# Patient Record
Sex: Female | Born: 1965 | Race: White | Hispanic: No | Marital: Married | Smoking: Never smoker
Health system: Southern US, Community
[De-identification: ages and names within clinical notes are randomized; demographics above are authoritative.]

## PROBLEM LIST (undated history)

## (undated) DIAGNOSIS — T4145XA Adverse effect of unspecified anesthetic, initial encounter: Secondary | ICD-10-CM

## (undated) DIAGNOSIS — Z5189 Encounter for other specified aftercare: Secondary | ICD-10-CM

## (undated) DIAGNOSIS — Z9889 Other specified postprocedural states: Secondary | ICD-10-CM

## (undated) DIAGNOSIS — J45909 Unspecified asthma, uncomplicated: Secondary | ICD-10-CM

## (undated) DIAGNOSIS — D649 Anemia, unspecified: Secondary | ICD-10-CM

## (undated) DIAGNOSIS — T8859XA Other complications of anesthesia, initial encounter: Secondary | ICD-10-CM

## (undated) DIAGNOSIS — C4490 Unspecified malignant neoplasm of skin, unspecified: Secondary | ICD-10-CM

## (undated) DIAGNOSIS — D702 Other drug-induced agranulocytosis: Secondary | ICD-10-CM

## (undated) DIAGNOSIS — G608 Other hereditary and idiopathic neuropathies: Secondary | ICD-10-CM

## (undated) DIAGNOSIS — Z87442 Personal history of urinary calculi: Secondary | ICD-10-CM

## (undated) DIAGNOSIS — R112 Nausea with vomiting, unspecified: Secondary | ICD-10-CM

## (undated) DIAGNOSIS — T7840XA Allergy, unspecified, initial encounter: Secondary | ICD-10-CM

## (undated) DIAGNOSIS — R011 Cardiac murmur, unspecified: Secondary | ICD-10-CM

## (undated) HISTORY — DX: Encounter for other specified aftercare: Z51.89

## (undated) HISTORY — DX: Other hereditary and idiopathic neuropathies: G60.8

## (undated) HISTORY — DX: Allergy, unspecified, initial encounter: T78.40XA

## (undated) HISTORY — PX: DILATION AND CURETTAGE OF UTERUS: SHX78

## (undated) HISTORY — DX: Unspecified malignant neoplasm of skin, unspecified: C44.90

## (undated) HISTORY — PX: CHOLECYSTECTOMY: SHX55

## (undated) HISTORY — PX: ABDOMINAL HYSTERECTOMY: SHX81

## (undated) HISTORY — PX: OTHER SURGICAL HISTORY: SHX169

## (undated) HISTORY — DX: Other drug-induced agranulocytosis: D70.2

---

## 1998-12-11 ENCOUNTER — Other Ambulatory Visit: Admission: RE | Admit: 1998-12-11 | Discharge: 1998-12-11 | Payer: Self-pay | Admitting: *Deleted

## 2000-01-18 ENCOUNTER — Other Ambulatory Visit: Admission: RE | Admit: 2000-01-18 | Discharge: 2000-01-18 | Payer: Self-pay | Admitting: *Deleted

## 2001-02-01 ENCOUNTER — Other Ambulatory Visit: Admission: RE | Admit: 2001-02-01 | Discharge: 2001-02-01 | Payer: Self-pay | Admitting: Obstetrics and Gynecology

## 2001-10-27 ENCOUNTER — Ambulatory Visit (HOSPITAL_COMMUNITY): Admission: RE | Admit: 2001-10-27 | Discharge: 2001-10-27 | Payer: Self-pay | Admitting: *Deleted

## 2002-03-01 ENCOUNTER — Other Ambulatory Visit: Admission: RE | Admit: 2002-03-01 | Discharge: 2002-03-01 | Payer: Self-pay | Admitting: Obstetrics and Gynecology

## 2003-03-20 ENCOUNTER — Other Ambulatory Visit: Admission: RE | Admit: 2003-03-20 | Discharge: 2003-03-20 | Payer: Self-pay | Admitting: Obstetrics and Gynecology

## 2003-11-18 ENCOUNTER — Ambulatory Visit (HOSPITAL_COMMUNITY): Admission: RE | Admit: 2003-11-18 | Discharge: 2003-11-18 | Payer: Self-pay | Admitting: Obstetrics and Gynecology

## 2004-12-28 ENCOUNTER — Ambulatory Visit (HOSPITAL_COMMUNITY): Admission: RE | Admit: 2004-12-28 | Discharge: 2004-12-28 | Payer: Self-pay | Admitting: Obstetrics and Gynecology

## 2005-12-30 ENCOUNTER — Ambulatory Visit (HOSPITAL_COMMUNITY): Admission: RE | Admit: 2005-12-30 | Discharge: 2005-12-30 | Payer: Self-pay | Admitting: Obstetrics and Gynecology

## 2006-09-22 ENCOUNTER — Encounter: Admission: RE | Admit: 2006-09-22 | Discharge: 2006-09-22 | Payer: Self-pay | Admitting: Allergy and Immunology

## 2007-01-25 ENCOUNTER — Ambulatory Visit (HOSPITAL_COMMUNITY): Admission: RE | Admit: 2007-01-25 | Discharge: 2007-01-25 | Payer: Self-pay | Admitting: Obstetrics and Gynecology

## 2007-06-26 ENCOUNTER — Encounter: Admission: RE | Admit: 2007-06-26 | Discharge: 2007-06-26 | Payer: Self-pay | Admitting: Gastroenterology

## 2008-01-22 ENCOUNTER — Encounter: Admission: RE | Admit: 2008-01-22 | Discharge: 2008-01-22 | Payer: Self-pay | Admitting: Gastroenterology

## 2008-01-29 ENCOUNTER — Ambulatory Visit (HOSPITAL_COMMUNITY): Admission: RE | Admit: 2008-01-29 | Discharge: 2008-01-29 | Payer: Self-pay | Admitting: Obstetrics and Gynecology

## 2008-02-13 ENCOUNTER — Encounter: Admission: RE | Admit: 2008-02-13 | Discharge: 2008-02-13 | Payer: Self-pay | Admitting: Gastroenterology

## 2008-04-29 ENCOUNTER — Ambulatory Visit (HOSPITAL_COMMUNITY): Admission: RE | Admit: 2008-04-29 | Discharge: 2008-04-30 | Payer: Self-pay | Admitting: Obstetrics and Gynecology

## 2008-04-29 ENCOUNTER — Encounter (INDEPENDENT_AMBULATORY_CARE_PROVIDER_SITE_OTHER): Payer: Self-pay | Admitting: Obstetrics and Gynecology

## 2009-02-04 ENCOUNTER — Ambulatory Visit (HOSPITAL_COMMUNITY): Admission: RE | Admit: 2009-02-04 | Discharge: 2009-02-04 | Payer: Self-pay | Admitting: Obstetrics and Gynecology

## 2010-02-11 ENCOUNTER — Ambulatory Visit (HOSPITAL_COMMUNITY)
Admission: RE | Admit: 2010-02-11 | Discharge: 2010-02-11 | Payer: Self-pay | Source: Home / Self Care | Attending: Obstetrics and Gynecology | Admitting: Obstetrics and Gynecology

## 2010-02-13 ENCOUNTER — Emergency Department (HOSPITAL_COMMUNITY)
Admission: EM | Admit: 2010-02-13 | Discharge: 2010-02-13 | Payer: Self-pay | Source: Home / Self Care | Admitting: Emergency Medicine

## 2010-04-27 LAB — CBC
HCT: 39.1 % (ref 36.0–46.0)
Hemoglobin: 13.3 g/dL (ref 12.0–15.0)
MCH: 31.5 pg (ref 26.0–34.0)
MCHC: 34 g/dL (ref 30.0–36.0)
MCV: 92.7 fL (ref 78.0–100.0)
Platelets: 301 10*3/uL (ref 150–400)
RBC: 4.22 MIL/uL (ref 3.87–5.11)
RDW: 12.3 % (ref 11.5–15.5)
WBC: 10.8 10*3/uL — ABNORMAL HIGH (ref 4.0–10.5)

## 2010-04-27 LAB — BASIC METABOLIC PANEL
BUN: 5 mg/dL — ABNORMAL LOW (ref 6–23)
CO2: 26 mEq/L (ref 19–32)
Calcium: 9.4 mg/dL (ref 8.4–10.5)
Chloride: 100 mEq/L (ref 96–112)
Creatinine, Ser: 0.65 mg/dL (ref 0.4–1.2)
GFR calc Af Amer: >60 mL/min (ref >60)
GFR calc non Af Amer: >60 mL/min (ref >60)
Glucose, Bld: 109 mg/dL — ABNORMAL HIGH (ref 70–99)
Potassium: 3.9 mEq/L (ref 3.5–5.1)
Sodium: 136 mEq/L (ref 135–145)

## 2010-04-27 LAB — URINALYSIS, ROUTINE W REFLEX MICROSCOPIC
Bilirubin Urine: NEGATIVE
Glucose, UA: NEGATIVE mg/dL
Hgb urine dipstick: NEGATIVE
Nitrite: NEGATIVE
Protein, ur: NEGATIVE mg/dL
Specific Gravity, Urine: 1.019 (ref 1.005–1.030)
Urobilinogen, UA: 0.2 mg/dL (ref 0.0–1.0)
pH: 5.5 (ref 5.0–8.0)

## 2010-04-27 LAB — DIFFERENTIAL
Basophils Absolute: 0 10*3/uL (ref 0.0–0.1)
Basophils Relative: 0 % (ref 0–1)
Eosinophils Absolute: 0 10*3/uL (ref 0.0–0.7)
Eosinophils Relative: 0 % (ref 0–5)
Lymphocytes Relative: 11 % — ABNORMAL LOW (ref 12–46)
Lymphs Abs: 1.2 10*3/uL (ref 0.7–4.0)
Monocytes Absolute: 0.5 10*3/uL (ref 0.1–1.0)
Monocytes Relative: 5 % (ref 3–12)
Neutro Abs: 9.1 10*3/uL — ABNORMAL HIGH (ref 1.7–7.7)
Neutrophils Relative %: 84 % — ABNORMAL HIGH (ref 43–77)

## 2010-04-27 LAB — URINE MICROSCOPIC-ADD ON

## 2010-05-28 LAB — PREGNANCY, URINE: Preg Test, Ur: NEGATIVE

## 2010-05-28 LAB — CBC
HCT: 29.7 % — ABNORMAL LOW (ref 36.0–46.0)
HCT: 34.9 % — ABNORMAL LOW (ref 36.0–46.0)
Hemoglobin: 10.1 g/dL — ABNORMAL LOW (ref 12.0–15.0)
Hemoglobin: 11.8 g/dL — ABNORMAL LOW (ref 12.0–15.0)
MCHC: 34.1 g/dL (ref 30.0–36.0)
MCHC: 33.8 g/dL (ref 30.0–36.0)
MCV: 90.2 fL (ref 78.0–100.0)
MCV: 90.3 fL (ref 78.0–100.0)
Platelets: 182 10*3/uL (ref 150–400)
Platelets: 218 10*3/uL (ref 150–400)
RBC: 3.3 MIL/uL — ABNORMAL LOW (ref 3.87–5.11)
RBC: 3.87 MIL/uL (ref 3.87–5.11)
RDW: 13.2 % (ref 11.5–15.5)
RDW: 13.2 % (ref 11.5–15.5)
WBC: 6.7 10*3/uL (ref 4.0–10.5)
WBC: 3.6 10*3/uL — ABNORMAL LOW (ref 4.0–10.5)

## 2010-05-28 LAB — BASIC METABOLIC PANEL
BUN: 3 mg/dL — ABNORMAL LOW (ref 6–23)
CO2: 27 mEq/L (ref 19–32)
Calcium: 8.2 mg/dL — ABNORMAL LOW (ref 8.4–10.5)
Chloride: 106 mEq/L (ref 96–112)
Creatinine, Ser: 0.6 mg/dL (ref 0.4–1.2)
GFR calc Af Amer: >60 mL/min (ref >60)
GFR calc non Af Amer: >60 mL/min (ref >60)
Glucose, Bld: 129 mg/dL — ABNORMAL HIGH (ref 70–99)
Potassium: 3.6 mEq/L (ref 3.5–5.1)
Sodium: 137 mEq/L (ref 135–145)

## 2010-06-30 NOTE — Discharge Summary (Signed)
NAMEVONCILLE, SIMM                  ACCOUNT NO.:  [id number]   MEDICAL RECORD NO.:  [id number]          PATIENT TYPE:  OIB   LOCATION:  9315                          FACILITY:  [facility]   PHYSICIAN:  Gabrielle Dare. Janee Morn, M.D.DATE OF BIRTH:  01-Jun-1965   DATE OF ADMISSION:  04/29/2008  DATE OF DISCHARGE:                               DISCHARGE SUMMARY   PREOPERATIVE DIAGNOSES:  1. Gallbladder polyp.  2. Abdominal pain.   POSTOPERATIVE DIAGNOSES:  1. Gallbladder polyp.  2. Abdominal pain.   PROCEDURE:  Laparoscopic cholecystectomy with intraoperative  cholangiogram.   SURGEON:  Gabrielle Dare. Janee Morn, M.D.   ASSISTANT:  Angelia Mould. Derrell Lolling, M.D.   ANESTHESIA:  General endotracheal.   HISTORY OF PRESENT ILLNESS:  Kathryn Ramsey is a 45 year old female who I  evaluated in the office for abdominal pain.  Her workup had included  ultrasound showing a gallbladder polyp.  She is undergoing laparoscopic-  assisted hysterectomy today by Dr. Maxie Better and I am following  while the patient is in the operating room with a cholecystectomy.   PROCEDURE IN DETAIL:  The patient was identified in the operating room.  We did our timeout procedure.  Her abdomen was already prepped and  draped.  She also had 2 ports left in place by Dr. Cherly Hensen, 1 a 10-mm in  the umbilicus and there was also a right lower quadrant 5-mm port so  laparoscopic exploration revealed gallbladder with some filmy adhesions.  Under direct vision, we placed an 11-mm epigastric port and then one 5-  mm right-sided port.  Marcaine 0.25% with epinephrine was used at those  port sites, then when the gallbladder was retracted superomedially, the  filmy adhesions were taken down with sharp dissection as they were  adhesions between the duodenum and the gallbladder.  These were all  taken down safely and we stayed well away from the duodenum.  Once this  swept down, we identified the infundibulum.  This was retracted  inferolaterally.   Dissection began laterally and progressed medially.  This identified the anterior branch of the cystic artery and this was  clipped twice proximally, once distally and divided.  Further dissection  revealed the cystic duct.  This was dissected until a large window was  created between the cystic duct, the infundibulum and the liver.  Once  we had excellent visualization, a clip was placed on the  infundibulocystic duct junction.  A small nick was made in the cystic  duct and a Reddick cholangiogram catheter was inserted.  Intraoperative  cholangiogram was obtained demonstrating no common bile duct  filling  defects and good flow of contrast into the duodenum.  Cholangiogram  catheter was removed.  Three clips were placed proximally on the cystic  duct and it was divided.  Further dissection revealed a posterior branch  of the cystic artery.  This was clipped twice proximally, once distally  and it was divided.  The gallbladder was taken off the liver bed with  the Bovie cautery.  We did encounter 1 other vein versus another small  branch of the cystic  artery.  This was also clipped twice proximally and  divided distally with the cautery.  The gallbladder was taken the rest  of the way off the liver bed with the Bovie cautery, placed in an  EndoCatch bag.  It was removed from the abdomen via the infraumbilical  port site.  The abdomen was copiously irrigated with saline, then the  irrigation fluid returned clear.  The liver bed was checked and was  completely dry.  The clips were all in good position.  The remainder of  the irrigation fluid was evacuated and it did remain clear.  The liver  bed was checked 1 more time and remained dry.  The ports were then  removed under direct vision.  The pneumoperitoneum was released.  The  infraumbilical fascia was closed by tying the 0-Vicryl pursestring  suture.  All 4 wounds were copiously irrigated and the skin of each was  closed with a running 4-0  Vicryl subcuticular stitch followed by  Dermabond.  Sponge, needle and instrument counts were correct.  Patient  tolerated the procedure well without apparent complication, was checked  in recovery room in stable condition.      Gabrielle Dare Janee Morn, M.D.  Electronically Signed     B[facility]/MEDQ  D:  04/29/2008  T:  04/29/2008  Job:  [id number]   cc:   Maxie Better, M.D.  Fax: [phone]   JXBJYN WGN FAOZ, M.D.  Fax: [phone]   [place]. Cliffton Asters, M.D.  Fax: [phone]

## 2010-06-30 NOTE — Op Note (Signed)
NAME:  Kathryn Ramsey, Kathryn Ramsey                  ACCOUNT NO.:  [id number]   MEDICAL RECORD NO.:  [id number]          PATIENT TYPE:  OIB   LOCATION:  9315                          FACILITY:  [facility]   PHYSICIAN:  Maxie Better, M.D.DATE OF BIRTH:  [birth year]   DATE OF PROCEDURE:  DATE OF DISCHARGE:                               OPERATIVE REPORT   PREOPERATIVE DIAGNOSES:  1. Menorrhagia.  2. Fibroid uterus.  3. Pelvic pain.   PROCEDURE:  1. Laparoscopic-assisted vaginal hysterectomy.  2. Right paratubal cyst removal.   POSTOPERATIVE DIAGNOSES:  1. Fibroid uterus.  2. Menorrhagia.  3. Ruptured left ovarian cyst.  4. Pelvic pain.   ANESTHESIA:  General.   SURGEON:  Maxie Better, MD   ASSISTANT:  Alphonsus Sias. Ernestina Penna, MD   PROCEDURE:  Under adequate general anesthesia, the patient was placed in  the dorsal lithotomy position.  She was sterilely prepped and draped in  usual fashion for a planned laparoscopic vaginal hysterectomy and a  laparoscopic cholecystectomy.  Examination under anesthesia revealed a  bulky 8-week size retroverted uterus.  No adnexal masses could be  appreciated.  Bivalve speculum was placed in the vagina.  Single-tooth  tenaculum was placed on the anterior lip of the cervix.  An acorn  cannula was introduced in the cervical os and attached to the tenaculum  for manipulation of the uterus.  The bivalve speculum was then removed.  Indwelling Foley catheter was placed.  The patient was then turned to  the abdomen.  Previous laparoscopic incision was noted.  Marcaine 0.25%  was injected.  Incision was then made infraumbilical carried down to the  rectus fascia.  Rectus fascia was opened transversely, and a pursestring  suture was then placed.  The peritoneum was entered bluntly, and Hasson  cannula introduced into the abdominal cavity.  A lighted video  laparoscope was placed through that port.  Two sets of other ports were  placed in right and left lower quadrant  under direct visualization after  0.25% Marcaine was injected and small incision was made on either side.  Once the 5mm ports were placed, the pelvis was inspected.  Normal liver  edge was noted and the gallbladder was seen.  The uterus with multiple  fibroids, large one prominent posteriorly.  Hemoperitoneum was noted.  Right tube with a paratubal cyst and  right ovary were normal.  Left  ovary had ruptured surface that was slightly bleeding and normal left  tube.  The pelvis was irrigated, suctioned of the blood. The ureters  were noted to be peristalsing bilaterally. The  procedure was then  performed using Gyrus, and the round ligaments bilaterally were clamped,  cauterized, and cut.  The utero-ovarian ligaments were bilaterally  clamped, cauterized, and cut.  The uterine vessels were seen bilaterally  and they were both cauterized but not cut.  The anterior leaf of the  bladder reflection was then opened transversely, and the bladder was  then sharply dissected off the lower uterine segment.  Once this was  done, attention was then turned to the vagina.  The cervix was then  grasped with [place] clamps.  Dilute solution of Pitressin was injected  circumferentially at the cervicovaginal junction.  A circumferential  incision was then made, and the anterior and posterior cul-de-sac was  then opened without incident.  The uterosacrals were bilaterally  clamped, cut, and suture ligated with 0 Vicryl.  The posterior vaginal  cuff was reefed with 0 Vicryl suture.  The cardinal ligaments were then  serially clamped, cauterized, and cut until all attachments of the  uterus was then removed.  The uterine vessels was then cauterized as  well.  The uterus was then removed after much traction due to the  multiple fibroids.  Once that was removed, the postop cul-de-sac was  inspected.  The rectosigmoid was very close to the vaginal cuff but not  including it, and therefore a McCall culdoplasty  was not done.  Several  other additional stitches was placed on the vaginal cuff posteriorly for  hemostasis.  Once this was all performed, the vagina was then closed in  vertical fashion using interrupted 0 Vicryl sutures.  The uterosacrals  were tied in the midline bilaterally prior to the final closure.  The  vagina was irrigated.  It was then noted that there was a small abrasion  of the left vagina wall from the manipulation during the surgery and 3-0  Vicryl sutures were then used to close that.  Using sterile technique,  attention was then turned back to the abdomen which was reinsufflated.  The scope was reinserted.  The pelvis was irrigated, suctioned.  The  abdomen was partially deflated.  The adnexa was inspected, and bleeding  was noted on the left adnexa that was cauterized.  The rest the pelvis  looked hemostased.  At that point, the left lower port was removed,  sutured with 4-0 Vicryl suture.  The right port and the infraumbilical  port was left for Dr. Janee Morn to perform his laparoscopic  cholecystectomy.  Please see his dictation for that portion of the case.   SPECIMENS:  Uterus and cervix and paratubal cyst all sent to Pathology.   ESTIMATED BLOOD LOSS:  200.   INTRAOPERATIVE FLUID:  2600.   URINE OUTPUT:  450 mL of clear yellow urine.   Sponge and instrument counts correct.   COMPLICATIONS:  None.   The patient tolerated the procedure well and will be transferred after  the laparoscopic cholecystectomy.      Maxie Better, M.D.  Electronically Signed     [place]/MEDQ  D:  04/29/2008  T:  04/30/2008  Job:  [id number]

## 2011-01-05 ENCOUNTER — Other Ambulatory Visit (HOSPITAL_COMMUNITY): Payer: Self-pay | Admitting: Obstetrics and Gynecology

## 2011-01-05 DIAGNOSIS — Z1231 Encounter for screening mammogram for malignant neoplasm of breast: Secondary | ICD-10-CM

## 2011-02-19 ENCOUNTER — Ambulatory Visit (HOSPITAL_COMMUNITY)
Admission: RE | Admit: 2011-02-19 | Discharge: 2011-02-19 | Disposition: A | Discharge status: Home / Self Care | Payer: 59 | Source: Ambulatory Visit | Attending: Obstetrics and Gynecology | Admitting: Obstetrics and Gynecology

## 2011-02-19 DIAGNOSIS — Z1231 Encounter for screening mammogram for malignant neoplasm of breast: Secondary | ICD-10-CM | POA: Insufficient documentation

## 2012-01-26 ENCOUNTER — Other Ambulatory Visit (HOSPITAL_COMMUNITY): Payer: Self-pay | Admitting: Obstetrics and Gynecology

## 2012-01-26 DIAGNOSIS — Z1231 Encounter for screening mammogram for malignant neoplasm of breast: Secondary | ICD-10-CM

## 2012-02-29 ENCOUNTER — Ambulatory Visit (HOSPITAL_COMMUNITY)
Admission: RE | Admit: 2012-02-29 | Discharge: 2012-02-29 | Disposition: A | Discharge status: Home / Self Care | Payer: BC Managed Care – PPO | Source: Ambulatory Visit | Attending: Obstetrics and Gynecology | Admitting: Obstetrics and Gynecology

## 2012-02-29 DIAGNOSIS — Z1231 Encounter for screening mammogram for malignant neoplasm of breast: Secondary | ICD-10-CM | POA: Insufficient documentation

## 2013-01-30 ENCOUNTER — Other Ambulatory Visit (HOSPITAL_COMMUNITY): Payer: Self-pay | Admitting: Obstetrics and Gynecology

## 2013-01-30 DIAGNOSIS — Z1231 Encounter for screening mammogram for malignant neoplasm of breast: Secondary | ICD-10-CM

## 2013-03-05 ENCOUNTER — Ambulatory Visit (HOSPITAL_COMMUNITY)
Admission: RE | Admit: 2013-03-05 | Discharge: 2013-03-05 | Disposition: A | Discharge status: Home / Self Care | Payer: BC Managed Care – PPO | Source: Ambulatory Visit | Attending: Obstetrics and Gynecology | Admitting: Obstetrics and Gynecology

## 2013-03-05 DIAGNOSIS — Z1231 Encounter for screening mammogram for malignant neoplasm of breast: Secondary | ICD-10-CM | POA: Insufficient documentation

## 2014-01-28 ENCOUNTER — Other Ambulatory Visit (HOSPITAL_COMMUNITY): Payer: Self-pay | Admitting: Obstetrics and Gynecology

## 2014-01-28 DIAGNOSIS — Z1231 Encounter for screening mammogram for malignant neoplasm of breast: Secondary | ICD-10-CM

## 2014-03-13 ENCOUNTER — Ambulatory Visit (HOSPITAL_COMMUNITY): Payer: BLUE CROSS/BLUE SHIELD

## 2014-03-20 ENCOUNTER — Ambulatory Visit (HOSPITAL_COMMUNITY)
Admission: RE | Admit: 2014-03-20 | Discharge: 2014-03-20 | Disposition: A | Discharge status: Home / Self Care | Payer: BLUE CROSS/BLUE SHIELD | Source: Ambulatory Visit | Attending: Obstetrics and Gynecology | Admitting: Obstetrics and Gynecology

## 2014-03-20 DIAGNOSIS — Z1231 Encounter for screening mammogram for malignant neoplasm of breast: Secondary | ICD-10-CM | POA: Insufficient documentation

## 2015-02-11 ENCOUNTER — Other Ambulatory Visit: Payer: Self-pay

## 2015-02-11 DIAGNOSIS — Z1231 Encounter for screening mammogram for malignant neoplasm of breast: Secondary | ICD-10-CM

## 2015-03-06 ENCOUNTER — Encounter: Payer: Self-pay | Admitting: *Deleted

## 2015-03-12 ENCOUNTER — Ambulatory Visit: Payer: BLUE CROSS/BLUE SHIELD | Admitting: Oncology

## 2015-03-17 ENCOUNTER — Ambulatory Visit: Payer: BLUE CROSS/BLUE SHIELD | Attending: Gynecologic Oncology | Admitting: Gynecologic Oncology

## 2015-03-17 ENCOUNTER — Encounter: Payer: Self-pay | Admitting: Gynecologic Oncology

## 2015-03-17 VITALS — BP 115/67 | HR 68 | Temp 97.5°F | Resp 18 | Ht 63.0 in | Wt 118.2 lb

## 2015-03-17 DIAGNOSIS — R971 Elevated cancer antigen 125 [CA 125]: Secondary | ICD-10-CM | POA: Diagnosis not present

## 2015-03-17 DIAGNOSIS — C482 Malignant neoplasm of peritoneum, unspecified: Secondary | ICD-10-CM | POA: Diagnosis not present

## 2015-03-17 DIAGNOSIS — C800 Disseminated malignant neoplasm, unspecified: Secondary | ICD-10-CM | POA: Diagnosis present

## 2015-03-17 DIAGNOSIS — C786 Secondary malignant neoplasm of retroperitoneum and peritoneum: Secondary | ICD-10-CM | POA: Diagnosis not present

## 2015-03-17 DIAGNOSIS — N951 Menopausal and female climacteric states: Secondary | ICD-10-CM | POA: Diagnosis not present

## 2015-03-17 MED ORDER — ESTRADIOL 0.05 MG/24HR TD PTTW: 1.0000 | MEDICATED_PATCH | TRANSDERMAL | Status: DC

## 2015-03-17 NOTE — Patient Instructions (Addendum)
You will receive a phone call from the Radiology Department with an appointment date and time for a biopsy.  Plan to have a CT scan of the chest as well on February 3 at [facility].  You will also receive a phone call from the [facility] with an appointment date and time to meet with Dr. Evlyn Clines, Medical Oncologist.
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
## 2015-03-18 ENCOUNTER — Other Ambulatory Visit: Payer: Self-pay | Admitting: Gynecologic Oncology

## 2015-03-18 ENCOUNTER — Inpatient Hospital Stay
Admission: RE | Admit: 2015-03-18 | Discharge: 2015-03-18 | Disposition: A | Discharge status: Home / Self Care | Payer: Self-pay | Source: Ambulatory Visit | Attending: Gynecologic Oncology | Admitting: Gynecologic Oncology

## 2015-03-18 DIAGNOSIS — C801 Malignant (primary) neoplasm, unspecified: Secondary | ICD-10-CM

## 2015-03-19 NOTE — Patient Instructions (Signed)
Kathryn Ramsey  03/19/2015   Your procedure is scheduled on: March 27, 2015  Report to [facility] Main  Entrance take [facility]  elevators to 3rd floor to  [place] at 10:00  AM.  Call this number if you have problems the morning of surgery [phone]   Remember: ONLY 1 PERSON MAY GO WITH YOU TO SHORT STAY TO GET  READY MORNING OF [place].  Do not eat food after midnight on Tuesday.  Wednesday morning follow clear liquid diet until midnight Wednesday night.  Then nothing by mouth.              Bowel prep as instructed by physician      Take these medicines the morning of surgery with A SIP OF WATER: none DO NOT TAKE ANY DIABETIC MEDICATIONS DAY OF YOUR SURGERY                               You may not have any metal on your body including hair pins and              piercings  Do not wear jewelry, make-up, lotions, powders or perfumes, deodorant             Do not wear nail polish.  Do not shave  48 hours prior to surgery.           Do not bring valuables to the hospital. [place].  Contacts, dentures or bridgework may not be worn into surgery.  Leave suitcase in the car. After surgery it may be brought to your room.        Special Instructions: coughing and deep breathing exercises, leg exercises              Please read over the following fact sheets you were given: _____________________________________________________________________             [facility] - Preparing for Surgery Before surgery, you can play an important role.  Because skin is not sterile, your skin needs to be as free of germs as possible.  You can reduce the number of germs on your skin by washing with CHG (chlorahexidine gluconate) soap before surgery.  CHG is an antiseptic cleaner which kills germs and bonds with the skin to continue killing germs even after washing. Please DO NOT use if you have an allergy to CHG or  antibacterial soaps.  If your skin becomes reddened/irritated stop using the CHG and inform your nurse when you arrive at Short Stay. Do not shave (including legs and underarms) for at least 48 hours prior to the first CHG shower.  You may shave your face/neck. Please follow these instructions carefully:  1.  Shower with CHG Soap the night before surgery and the  morning of Surgery.  2.  If you choose to wash your hair, wash your hair first as usual with your  normal  shampoo.  3.  After you shampoo, rinse your hair and body thoroughly to remove the  shampoo.                           4.  Use CHG as you would any other liquid soap.  You can apply chg directly  to the skin and wash                       Gently with a scrungie or clean washcloth.  5.  Apply the CHG Soap to your body ONLY FROM THE NECK DOWN.   Do not use on face/ open                           Wound or open sores. Avoid contact with eyes, ears mouth and genitals (private parts).                       Wash face,  Genitals (private parts) with your normal soap.             6.  Wash thoroughly, paying special attention to the area where your surgery  will be performed.  7.  Thoroughly rinse your body with warm water from the neck down.  8.  DO NOT shower/wash with your normal soap after using and rinsing off  the CHG Soap.                9.  Pat yourself dry with a clean towel.            10.  Wear clean pajamas.            11.  Place clean sheets on your bed the night of your first shower and do not  sleep with pets. Day of Surgery : Do not apply any lotions/deodorants the morning of surgery.  Please wear clean clothes to the hospital/surgery center.  FAILURE TO FOLLOW THESE INSTRUCTIONS MAY RESULT IN THE CANCELLATION OF YOUR SURGERY PATIENT SIGNATURE_________________________________  NURSE SIGNATURE__________________________________  ________________________________________________________________________   Adam Phenix  An incentive spirometer is a tool that can help keep your lungs clear and active. This tool measures how well you are filling your lungs with each breath. Taking long deep breaths may help reverse or decrease the chance of developing breathing (pulmonary) problems (especially infection) following:  A long period of time when you are unable to move or be active. BEFORE THE PROCEDURE   If the spirometer includes an indicator to show your best effort, your nurse or respiratory therapist will set it to a desired goal.  If possible, sit up straight or lean slightly forward. Try not to slouch.  Hold the incentive spirometer in an upright position. INSTRUCTIONS FOR USE  1. Sit on the edge of your bed if possible, or sit up as far as you can in bed or on a chair. 2. Hold the incentive spirometer in an upright position. 3. Breathe out normally. 4. Place the mouthpiece in your mouth and seal your lips tightly around it. 5. Breathe in slowly and as deeply as possible, raising the piston or the ball toward the top of the column. 6. Hold your breath for 3-5 seconds or for as long as possible. Allow the piston or ball to fall to the bottom of the column. 7. Remove the mouthpiece from your mouth and breathe out normally. 8. Rest for a few seconds and repeat Steps 1 through 7 at least 10 times every 1-2 hours when you are awake. Take your time and take a few normal breaths between deep breaths. 9. The spirometer may include an indicator to show your best effort. Use the indicator as a goal to work toward  during each repetition. 10. After each set of 10 deep breaths, practice coughing to be sure your lungs are clear. If you have an incision (the cut made at the time of surgery), support your incision when coughing by placing a pillow or rolled up towels firmly against it. Once you are able to get out of bed, walk around indoors and cough well. You may stop using the incentive spirometer when  instructed by your caregiver.  RISKS AND COMPLICATIONS  Take your time so you do not get dizzy or light-headed.  If you are in pain, you may need to take or ask for pain medication before doing incentive spirometry. It is harder to take a deep breath if you are having pain. AFTER USE  Rest and breathe slowly and easily.  It can be helpful to keep track of a log of your progress. Your caregiver can provide you with a simple table to help with this. If you are using the spirometer at home, follow these instructions: [place] IF:   You are having difficultly using the spirometer.  You have trouble using the spirometer as often as instructed.  Your pain medication is not giving enough relief while using the spirometer.  You develop fever of 100.5 F (38.1 C) or higher. SEEK IMMEDIATE MEDICAL CARE IF:   You cough up bloody sputum that had not been present before.  You develop fever of 102 F (38.9 C) or greater.  You develop worsening pain at or near the incision site. MAKE SURE YOU:   Understand these instructions.  Will watch your condition.  Will get help right away if you are not doing well or get worse. Document Released: 06/14/2006 Document Revised: 04/26/2011 Document Reviewed: 08/15/2006 ExitCare Patient Information 2014 ExitCare, [place].   ________________________________________________________________________    CLEAR LIQUID DIET   Foods Allowed                                                                     Foods Excluded  Coffee and tea, regular and decaf                             liquids that you cannot  Plain Jell-O in any flavor                                             see through such as: Fruit ices (not with fruit pulp)                                     milk, soups, orange juice  Iced Popsicles                                    All solid food  Cranberry, grape and apple juices Sports drinks  like Gatorade Lightly seasoned clear broth or consume(fat free) Sugar, honey syrup  Sample Menu Breakfast                                Lunch                                     Supper Cranberry juice                    Beef broth                            Chicken broth Jell-O                                     Grape juice                           Apple juice Coffee or tea                        Jell-O                                      Popsicle                                                Coffee or tea                        Coffee or tea  _____________________________________________________________________

## 2015-03-20 ENCOUNTER — Other Ambulatory Visit: Payer: BLUE CROSS/BLUE SHIELD

## 2015-03-20 ENCOUNTER — Other Ambulatory Visit (HOSPITAL_BASED_OUTPATIENT_CLINIC_OR_DEPARTMENT_OTHER): Payer: BLUE CROSS/BLUE SHIELD

## 2015-03-20 ENCOUNTER — Other Ambulatory Visit: Payer: Self-pay | Admitting: Oncology

## 2015-03-20 ENCOUNTER — Encounter: Payer: Self-pay | Admitting: *Deleted

## 2015-03-20 DIAGNOSIS — C800 Disseminated malignant neoplasm, unspecified: Secondary | ICD-10-CM

## 2015-03-20 LAB — COMPREHENSIVE METABOLIC PANEL
ALT: 15 U/L (ref 0–55)
AST: 20 U/L (ref 5–34)
Albumin: 4.6 g/dL (ref 3.5–5.0)
Alkaline Phosphatase: 50 U/L (ref 40–150)
Anion Gap: 10 mEq/L (ref 3–11)
BUN: 10 mg/dL (ref 7.0–26.0)
CO2: 25 mEq/L (ref 22–29)
Calcium: 9.4 mg/dL (ref 8.4–10.4)
Chloride: 103 mEq/L (ref 98–109)
Creatinine: 0.8 mg/dL (ref 0.6–1.1)
EGFR: >90 mL/min/{1.73_m2} (ref >90)
Glucose: 90 mg/dl (ref 70–140)
Potassium: 4 mEq/L (ref 3.5–5.1)
Sodium: 138 mEq/L (ref 136–145)
Total Bilirubin: 0.49 mg/dL (ref 0.20–1.20)
Total Protein: 8.3 g/dL (ref 6.4–8.3)

## 2015-03-20 LAB — CBC WITH DIFFERENTIAL/PLATELET
BASO%: 0.7 % (ref 0.0–2.0)
Basophils Absolute: 0 10*3/uL (ref 0.0–0.1)
EOS%: 1.1 % (ref 0.0–7.0)
Eosinophils Absolute: 0 10*3/uL (ref 0.0–0.5)
HCT: 41.5 % (ref 34.8–46.6)
HGB: 13.9 g/dL (ref 11.6–15.9)
LYMPH%: 42.4 % (ref 14.0–49.7)
MCH: 30.7 pg (ref 25.1–34.0)
MCHC: 33.4 g/dL (ref 31.5–36.0)
MCV: 91.9 fL (ref 79.5–101.0)
MONO#: 0.3 10*3/uL (ref 0.1–0.9)
MONO%: 8.1 % (ref 0.0–14.0)
NEUT#: 1.9 10*3/uL (ref 1.5–6.5)
NEUT%: 47.7 % (ref 38.4–76.8)
Platelets: 287 10*3/uL (ref 145–400)
RBC: 4.51 10*6/uL (ref 3.70–5.45)
RDW: 12.8 % (ref 11.2–14.5)
WBC: 4 10*3/uL (ref 3.9–10.3)
lymph#: 1.7 10*3/uL (ref 0.9–3.3)

## 2015-03-21 ENCOUNTER — Encounter (HOSPITAL_COMMUNITY)
Admission: RE | Admit: 2015-03-21 | Discharge: 2015-03-21 | Disposition: A | Discharge status: Home / Self Care | Payer: BLUE CROSS/BLUE SHIELD | Source: Ambulatory Visit | Attending: Gynecologic Oncology | Admitting: Gynecologic Oncology

## 2015-03-21 ENCOUNTER — Encounter (HOSPITAL_COMMUNITY): Payer: Self-pay

## 2015-03-21 ENCOUNTER — Ambulatory Visit (HOSPITAL_BASED_OUTPATIENT_CLINIC_OR_DEPARTMENT_OTHER): Payer: BLUE CROSS/BLUE SHIELD | Admitting: Oncology

## 2015-03-21 ENCOUNTER — Ambulatory Visit (HOSPITAL_COMMUNITY)
Admission: RE | Admit: 2015-03-21 | Discharge: 2015-03-21 | Disposition: A | Discharge status: Home / Self Care | Payer: BLUE CROSS/BLUE SHIELD | Source: Ambulatory Visit | Attending: Gynecologic Oncology | Admitting: Gynecologic Oncology

## 2015-03-21 ENCOUNTER — Other Ambulatory Visit: Payer: Self-pay | Admitting: Oncology

## 2015-03-21 ENCOUNTER — Encounter: Payer: Self-pay | Admitting: Oncology

## 2015-03-21 VITALS — BP 124/67 | HR 63 | Temp 97.2°F | Resp 18 | Ht 63.0 in | Wt 117.8 lb

## 2015-03-21 DIAGNOSIS — C801 Malignant (primary) neoplasm, unspecified: Secondary | ICD-10-CM | POA: Diagnosis not present

## 2015-03-21 DIAGNOSIS — C762 Malignant neoplasm of abdomen: Secondary | ICD-10-CM | POA: Insufficient documentation

## 2015-03-21 DIAGNOSIS — R9230 Dense breasts, unspecified: Secondary | ICD-10-CM | POA: Insufficient documentation

## 2015-03-21 DIAGNOSIS — R978 Other abnormal tumor markers: Secondary | ICD-10-CM | POA: Insufficient documentation

## 2015-03-21 DIAGNOSIS — R922 Inconclusive mammogram: Secondary | ICD-10-CM | POA: Insufficient documentation

## 2015-03-21 DIAGNOSIS — C800 Disseminated malignant neoplasm, unspecified: Secondary | ICD-10-CM | POA: Insufficient documentation

## 2015-03-21 DIAGNOSIS — Z90710 Acquired absence of both cervix and uterus: Secondary | ICD-10-CM

## 2015-03-21 DIAGNOSIS — K589 Irritable bowel syndrome without diarrhea: Secondary | ICD-10-CM | POA: Insufficient documentation

## 2015-03-21 DIAGNOSIS — Z9049 Acquired absence of other specified parts of digestive tract: Secondary | ICD-10-CM

## 2015-03-21 HISTORY — DX: Adverse effect of unspecified anesthetic, initial encounter: T41.45XA

## 2015-03-21 HISTORY — DX: Anemia, unspecified: D64.9

## 2015-03-21 HISTORY — DX: Unspecified asthma, uncomplicated: J45.909

## 2015-03-21 HISTORY — DX: Cardiac murmur, unspecified: R01.1

## 2015-03-21 HISTORY — DX: Other complications of anesthesia, initial encounter: T88.59XA

## 2015-03-21 LAB — URINALYSIS, ROUTINE W REFLEX MICROSCOPIC
Bilirubin Urine: NEGATIVE
Glucose, UA: NEGATIVE mg/dL
Hgb urine dipstick: NEGATIVE
Ketones, ur: NEGATIVE mg/dL
Nitrite: NEGATIVE
Protein, ur: NEGATIVE mg/dL
Specific Gravity, Urine: 1.027 (ref 1.005–1.030)
pH: 7 (ref 5.0–8.0)

## 2015-03-21 LAB — URINE MICROSCOPIC-ADD ON
Bacteria, UA: NONE SEEN
RBC / HPF: NONE SEEN RBC/hpf (ref 0–5)

## 2015-03-21 LAB — CA 125: Cancer Antigen (CA) 125: 1226 U/mL — ABNORMAL HIGH (ref 0.0–38.1)

## 2015-03-21 LAB — ABO/RH: ABO/RH(D): A POS

## 2015-03-21 MED ORDER — IOHEXOL 300 MG/ML  SOLN
75.0000 mL | Freq: Once | INTRAMUSCULAR | Status: AC | PRN
  Administered 2015-03-21: 70 mL via INTRAVENOUS

## 2015-03-21 NOTE — Progress Notes (Signed)
03-20-15 - CBC w/diff, CMP - EPIC

## 2015-03-21 NOTE — Progress Notes (Signed)
03-21-15 - Faxed UA/CULTURE lab results from preop visit on 03-21-15 to Dr. Denman George via [facility]

## 2015-03-21 NOTE — Progress Notes (Signed)
03-21-15 - CT Chest - EPIC 03-17-15 - LOV - Dr. Denman George (gyn.onc) - EPIC

## 2015-03-21 NOTE — Progress Notes (Signed)
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 Outside records from [facility] and Dr Collene Mares reviewed for visit and will be entered into this EMR.   HISTORY OF PRESENT ILLNESS:Kathryn Ramsey is a 50 y.o. female who is seen in consultation, together with husband , at the request of Dr Denman George, to expedite chemotherapy depending on findings at upcoming surgery.  Patient has been generally healthy, post vaginal hysterectomy for fibroids and menorrhagia 2012. In ~ 11-2014 she noticed bladder pressure and occasional trace blood ? Vaginal vs urinary. She was seen by Dr Pilar Jarvis, with cystoscopy 01-31-15 suggesting extrinsic compression of bladder. She had CT AP at [facility] 03-04-14, with moderate ascites, omental caking in lateral left abdomen and pelvis, absent uterus, "ovaries are likely visualized" , no adenopathy, no hydronephrosis or renal stones, lung bases clear, hemangioma posterior right hepatic lobe (CT report to be scanned into this EMR). CA 125 by Dr Collene Mares 03-06-15 was 957, with CEA 1.0. She had colonoscopy by Dr Collene Mares on 03-12-15, reportedly unremarkable. She had consultation with Dr Denman George on 03-17-15, with plan for diagnostic laparoscopy and omental biopsy on 03-27-15. If disease is amenable to R0 or R1 debulking, that will be done at same procedure and follow with adjuvant chemotherapy, otherwise we will proceed with neoadjuvant chemotherapy. She is to have CT chest immediately after this visit today. She attended chemotherapy education class prior to this visit.   REVIEW OF SYSTEMS: Heaviness across lower abdomen, no increase in the bladder symptoms in  last 1-2 weeks, no difficulty emptying bladder completely. Slight bleeding intermittently not clearly hematuria, seems to be after intercourse. Weight stable, appetite ok. IBS x years, no noted change, can be constipation or diarrhea. Has been off usual probiotic x few days. Occasional dull HA recently thought stress. Good visual acuity. No significant sinus symptoms. Hearing fine. No dental problems, up to date on cleanings/ exam. No thyroid problems. Rare asthma with bronchitis type respiratory illnesses. No noted changes in breasts. No cardiac symptoms, question of murmur in past. No GERD, nausea, vomiting. No arthritis. No LE swelling. No peripheral neuropathy.  Remainder of full 10 point review of systems negative.   ALLERGIES: Sulfa antibiotics  PAST MEDICAL/ SURGICAL HISTORY:    G4P4  Clomid for first pregnancy Vaginal hysterectomy with right paratubal cyst removal by Dr Garwin Brothers [phone] Cholecystectomy 2012 (laparoscopic) Georganna Skeans 3 laparoscopic procedures for endometriosis age 65-22 Sinus surgery in [place] Bilateral mammograms (not 3D) [facility] heterogeneously dense tissue Left femur surgeries x 2 age 59, with transfusion No bone density scan  CURRENT MEDICATIONS: reviewed as listed now in EMR Have not given prescriptions for antiemetics or decadron today Patient states that she takes very little medicine, generally only probiotic and stool softeners. She requests that medications be dosed as low as possible. I have explained chemotherapy dosing based on meter squared, and assured her that other meds will be dosed conservatively.  Has never had flu vaccine tho would be willing to have this. Was told by preadmission that she should NOT have this prior to surgery (?)   SOCIAL HISTORY:  From [place], married, lives with husband in [place]. Never smoker, social ETOH. 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 Husband is Hydrologist for [employer]. 3 sons, 1 daughter ages 5,  87, 84, 67. Three children in [place], 2 of those in school, and 1 in [place]:   Parents living at 67. Father with CAD by cath done early this AM. Mother heart disease.  Maternal aunt NHL Maternal aunt benign brain tumor age 47, hysterectomy ? Indication age 71, "thyroid seeds", renal cancer treated surgically age 82, breast cancer age 3. Lived to 79s. 3 sisters, 1 brother healthy with exception of elevated lipids in one sister Children all healthy  Niece with PE age 103, no other history known       PHYSICAL EXAM:  height is 5' 3" (1.6 m) and weight is 117 lb 12.8 oz (53.434 kg). Her oral temperature is 97.2 F (36.2 C). Her blood pressure is 124/67 and her pulse is 63. Her respiration is 18 and oxygen saturation is 100%.  Alert, pleasant, cooperative lady looks stated age, slender, easily mobile, does not appear uncomfortable other than tearful at times. Excellent historian, husband very supportive.   HEENT: normal hair pattern. PERRL, not icteric. Good dentition. Oral mucosa and posterior pharynx clear. Neck supple, no JVD or thyroid masses.   RESPIRATORY: lungs clear to A and P  CARDIAC/ VASCULAR: heart RRR, no murmur appreciated, clear heart sounds. Peripheral pulses symmetrical. Peripheral veins UE look adequate for chemo.  ABDOMEN: Not obviously distended, slightly more full and slightly more firm on left, not tender. Some normal bowel sounds. No clear mass or HSM  LYMPH NODES: no cervical, supraclavicular, axillary or inguinal adenopathy  BREASTS: bilaterally without dominant mass, skin or nipple findings of concern  NEUROLOGIC:CN, motor, sensory, cerebellar nonfocal. PSYCH appropriate mood and affect  SKIN:without rash, ecchymosis, petechiae  MUSCULOSKELETAL:back nontender. Symmetrical muscle mass. LE and UE without edema, cords, tenderness   LABORATORY DATA:  Results for orders placed or performed in visit on 03/20/15 (from the past 48 hour(s))  CBC with  Differential     Status: None   Collection Time: 03/20/15  9:33 AM  Result Value Ref Range   WBC 4.0 3.9 - 10.3 10e3/uL   NEUT# 1.9 1.5 - 6.5 10e3/uL   HGB 13.9 11.6 - 15.9 g/dL   HCT 41.5 34.8 - 46.6 %   Platelets 287 145 - 400 10e3/uL   MCV 91.9 79.5 - 101.0 fL   MCH 30.7 25.1 - 34.0 pg   MCHC 33.4 31.5 - 36.0 g/dL   RBC 4.51 3.70 - 5.45 10e6/uL   RDW 12.8 11.2 - 14.5 %   lymph# 1.7 0.9 - 3.3 10e3/uL   MONO# 0.3 0.1 - 0.9 10e3/uL   Eosinophils Absolute 0.0 0.0 - 0.5 10e3/uL   Basophils Absolute 0.0 0.0 - 0.1 10e3/uL   NEUT% 47.7 38.4 - 76.8 %   LYMPH% 42.4 14.0 - 49.7 %   MONO% 8.1 0.0 - 14.0 %   EOS% 1.1 0.0 - 7.0 %   BASO% 0.7 0.0 - 2.0 %  Comprehensive metabolic panel     Status: None   Collection Time: 03/20/15  9:33 AM  Result Value Ref Range   Sodium 138 136 - 145 mEq/L   Potassium 4.0 3.5 - 5.1 mEq/L   Chloride 103 98 - 109 mEq/L   CO2 25 22 - 29 mEq/L   Glucose 90 70 - 140 mg/dl    Comment: Glucose reference range is for nonfasting patients. Fasting glucose reference range is 70- 100.   BUN 10.0 7.0 -  26.0 mg/dL   Creatinine 0.8 0.6 - 1.1 mg/dL   Total Bilirubin 0.49 0.20 - 1.20 mg/dL   Alkaline Phosphatase 50 40 - 150 U/L   AST 20 5 - 34 U/L   ALT 15 0 - 55 U/L   Total Protein 8.3 6.4 - 8.3 g/dL   Albumin 4.6 3.5 - 5.0 g/dL   Calcium 9.4 8.4 - 10.4 mg/dL   Anion Gap 10 3 - 11 mEq/L   EGFR >90 >90 ml/min/1.73 m2    Comment: eGFR is calculated using the CKD-EPI Creatinine Equation (2009)  CA 125     Status: Abnormal   Collection Time: 03/20/15  9:33 AM  Result Value Ref Range   Cancer Antigen (CA) 125 1,226.0 (H) 0.0 - 38.1 U/mL    Comment: Roche ECLIA methodology      PATHOLOGY: None as yet  RADIOGRAPHY: CT AP [facility] 03-05-15 as noted above, report to be scanned into this EMR  CT chest available after this visit EXAM: CT CHEST WITH CONTRAST  TECHNIQUE: Multidetector CT imaging of the chest was performed during intravenous contrast  administration.  CONTRAST: 28m OMNIPAQUE IOHEXOL 300 MG/ML SOLN  COMPARISON: None.  FINDINGS: Mediastinum / Lymph Nodes: There is no axillary lymphadenopathy. No mediastinal lymphadenopathy. There is no hilar lymphadenopathy. Calcified lymph node identified in the upper right hilum. The heart size is normal. No pericardial effusion. The esophagus has normal imaging features.  Lungs / Pleura: No focal airspace consolidation. No pulmonary edema or pleural effusion. No suspicious pulmonary nodule or mass.  Upper Abdomen: Unremarkable.  MSK / Soft Tissues: Bone windows reveal no worrisome lytic or sclerotic osseous lesions.  IMPRESSION: Unremarkable CT exam of the thorax. Specifically, no features to suggest metastatic disease.   Mammograms 03-2014 report noted, heterogeneously dense breast tissue. We have discussed usefulness of 3D mammography particularly with dense breast tissue. Patient has mammograms scheduled upcoming and will request 3D.   DISCUSSION: All of history above reviewed with patient and husband. They understand rationale for surgery and chemotherapy, and are in agreement with plans as above.  Due to time restraints with CT today, did not discuss antiemetics or premedication decadron.  She may prefer treatment early in week. DIscussed some variability in CA125 with different labs, and usefulness of this marker in following treatment. 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 For laparoscopic biopsy and possible debulking surgery on 03-27-15, with neoadjuvant vs adjuvant chemotherapy. 2.post hysterectomy for benign indications 2012. History of endometriosis. 3.irritable bowel syndrome, known to Dr MCollene Mares4.no flu vaccine: would be best to have this prior to chemo, and note flu present in community now. If ok with gyn oncologist, will try to have this  done early next week 5.heterogeneously dense breast tissue by prior mammogram reports. Recommended 3D imaging [place] 6. Post cholecystecytomy 7.surgery for crushed left femur age 50    Patient and husband have had questions answered to their satisfaction and are in agreement with plan above. They can contact this office for questions or concerns at any time prior to next scheduled visit. Verbal consent given for chemo. Orders for carboplatin taxol and granix placed to allow preauthorization now.  Time spent 55 min, including >50% discussion and coordination of care. Cc Drs CVance Peper White   LGordy Levan MD 03/21/2015 12:10 PM

## 2015-03-24 ENCOUNTER — Ambulatory Visit
Admission: RE | Admit: 2015-03-24 | Discharge: 2015-03-24 | Disposition: A | Discharge status: Home / Self Care | Payer: BLUE CROSS/BLUE SHIELD | Source: Ambulatory Visit

## 2015-03-24 ENCOUNTER — Telehealth: Payer: Self-pay

## 2015-03-24 DIAGNOSIS — Z1231 Encounter for screening mammogram for malignant neoplasm of breast: Secondary | ICD-10-CM

## 2015-03-24 NOTE — Telephone Encounter (Signed)
Orders received from [facility] to contact the patient to update with CT of the chest W contrast on 03/21/2015 . Findings : Unremarkable CT exam of the thorax , specifically , no features to suggest metastatic disease . Patient contacted and updated , patient states understanding , denies further questions at this time .

## 2015-03-25 ENCOUNTER — Telehealth: Payer: Self-pay | Admitting: Oncology

## 2015-03-25 ENCOUNTER — Other Ambulatory Visit: Payer: Self-pay

## 2015-03-25 ENCOUNTER — Ambulatory Visit (HOSPITAL_BASED_OUTPATIENT_CLINIC_OR_DEPARTMENT_OTHER): Payer: BLUE CROSS/BLUE SHIELD

## 2015-03-25 ENCOUNTER — Telehealth: Payer: Self-pay

## 2015-03-25 VITALS — BP 109/60 | HR 69 | Temp 97.7°F

## 2015-03-25 DIAGNOSIS — Z23 Encounter for immunization: Secondary | ICD-10-CM | POA: Diagnosis not present

## 2015-03-25 MED ORDER — INFLUENZA VAC SPLIT QUAD 0.5 ML IM SUSY
0.5000 mL | PREFILLED_SYRINGE | Freq: Once | INTRAMUSCULAR | Status: AC
  Administered 2015-03-25: 0.5 mL via INTRAMUSCULAR
  Filled 2015-03-25: qty 0.5

## 2015-03-25 NOTE — Telephone Encounter (Signed)
Left message for patient re appointments for 2/20 and 2/22. Schedule mailed.

## 2015-03-25 NOTE — Telephone Encounter (Signed)
-----   Message from Gordy Levan, MD sent at 03/24/2015 10:21 AM EST ----- Flu vaccine today if possible - thanks ----- Message -----    From: Dorothyann Gibbs, NP    Sent: 03/24/2015  10:16 AM      To: [name], MD, Baruch Merl, RN, #  She is scheduled for surgery this Thursday.  Denman George said it would be fine for her to receive it before or after surgery but she would not have her come in for a special trip just for the flu shot.  Thanks! Melissa ----- Message -----    From: Gordy Levan, MD    Sent: 03/21/2015   8:25 PM      To: [name], MD, Dorothyann Gibbs, NP, #  Has not had flu vaccine, told me preadmissions told her not to have this before surgery --?  If you don't mind her having the vaccine, I would prefer to give this ~ Mon 2-6. Best to have it before chemo, and lots of flu happening-    Barbaraann Share - if ok with gyn onc, please try to get flu vaccine 2-6 if possible  thanks

## 2015-03-25 NOTE — Telephone Encounter (Signed)
Kathryn Ramsey agreed to come in today at 1045 for flu shot as noted below by Dr. [name].

## 2015-03-27 ENCOUNTER — Ambulatory Visit (HOSPITAL_COMMUNITY): Payer: BLUE CROSS/BLUE SHIELD | Admitting: Registered Nurse

## 2015-03-27 ENCOUNTER — Encounter (HOSPITAL_COMMUNITY): Payer: Self-pay | Admitting: Gynecologic Oncology

## 2015-03-27 ENCOUNTER — Inpatient Hospital Stay (HOSPITAL_COMMUNITY)
Admission: AD | Admit: 2015-03-27 | Discharge: 2015-03-29 | DRG: 357 | Disposition: A | Discharge status: Home / Self Care | Payer: BLUE CROSS/BLUE SHIELD | Source: Ambulatory Visit | Attending: Gynecologic Oncology | Admitting: Gynecologic Oncology

## 2015-03-27 ENCOUNTER — Encounter (HOSPITAL_COMMUNITY)
Admission: AD | Disposition: A | Discharge status: Home / Self Care | Payer: Self-pay | Source: Ambulatory Visit | Attending: Gynecologic Oncology

## 2015-03-27 DIAGNOSIS — R978 Other abnormal tumor markers: Secondary | ICD-10-CM | POA: Diagnosis present

## 2015-03-27 DIAGNOSIS — R188 Other ascites: Secondary | ICD-10-CM | POA: Diagnosis present

## 2015-03-27 DIAGNOSIS — C786 Secondary malignant neoplasm of retroperitoneum and peritoneum: Principal | ICD-10-CM | POA: Diagnosis present

## 2015-03-27 DIAGNOSIS — C481 Malignant neoplasm of specified parts of peritoneum: Secondary | ICD-10-CM | POA: Diagnosis not present

## 2015-03-27 DIAGNOSIS — Z823 Family history of stroke: Secondary | ICD-10-CM | POA: Diagnosis not present

## 2015-03-27 DIAGNOSIS — Z807 Family history of other malignant neoplasms of lymphoid, hematopoietic and related tissues: Secondary | ICD-10-CM

## 2015-03-27 DIAGNOSIS — C569 Malignant neoplasm of unspecified ovary: Secondary | ICD-10-CM | POA: Diagnosis present

## 2015-03-27 DIAGNOSIS — Z85828 Personal history of other malignant neoplasm of skin: Secondary | ICD-10-CM

## 2015-03-27 DIAGNOSIS — Z882 Allergy status to sulfonamides status: Secondary | ICD-10-CM | POA: Diagnosis not present

## 2015-03-27 DIAGNOSIS — Z90710 Acquired absence of both cervix and uterus: Secondary | ICD-10-CM

## 2015-03-27 DIAGNOSIS — C800 Disseminated malignant neoplasm, unspecified: Secondary | ICD-10-CM | POA: Diagnosis present

## 2015-03-27 DIAGNOSIS — Z5331 Laparoscopic surgical procedure converted to open procedure: Secondary | ICD-10-CM

## 2015-03-27 DIAGNOSIS — Z8249 Family history of ischemic heart disease and other diseases of the circulatory system: Secondary | ICD-10-CM | POA: Diagnosis not present

## 2015-03-27 HISTORY — PX: LAPAROTOMY: SHX154

## 2015-03-27 HISTORY — PX: LAPAROSCOPY: SHX197

## 2015-03-27 HISTORY — DX: Other specified postprocedural states: Z98.890

## 2015-03-27 HISTORY — DX: Nausea with vomiting, unspecified: R11.2

## 2015-03-27 LAB — TYPE AND SCREEN
ABO/RH(D): A POS
Antibody Screen: NEGATIVE

## 2015-03-27 SURGERY — LAPAROSCOPY, DIAGNOSTIC
Anesthesia: General | Site: Abdomen

## 2015-03-27 MED ORDER — SUFENTANIL CITRATE 50 MCG/ML IV SOLN
INTRAVENOUS | Status: AC
  Filled 2015-03-27: qty 1

## 2015-03-27 MED ORDER — ACETAMINOPHEN 10 MG/ML IV SOLN
INTRAVENOUS | Status: DC | PRN
  Administered 2015-03-27: 1000 mg via INTRAVENOUS

## 2015-03-27 MED ORDER — ONDANSETRON HCL 4 MG/2ML IJ SOLN
INTRAMUSCULAR | Status: DC | PRN
  Administered 2015-03-27: 4 mg via INTRAVENOUS

## 2015-03-27 MED ORDER — BUPIVACAINE LIPOSOME 1.3 % IJ SUSP
20.0000 mL | Freq: Once | INTRAMUSCULAR | Status: AC
  Administered 2015-03-27: 20 mL
  Filled 2015-03-27: qty 20

## 2015-03-27 MED ORDER — ROCURONIUM BROMIDE 100 MG/10ML IV SOLN
INTRAVENOUS | Status: DC | PRN
  Administered 2015-03-27: 50 mg via INTRAVENOUS
  Administered 2015-03-27 (×2): 20 mg via INTRAVENOUS

## 2015-03-27 MED ORDER — ENSURE ENLIVE PO LIQD
237.0000 mL | Freq: Two times a day (BID) | ORAL | Status: DC
  Administered 2015-03-28 – 2015-03-29 (×3): 237 mL via ORAL

## 2015-03-27 MED ORDER — HYDROMORPHONE HCL 1 MG/ML IJ SOLN: 0.2500 mg | INTRAMUSCULAR | Status: DC | PRN

## 2015-03-27 MED ORDER — SODIUM CHLORIDE 0.9 % IJ SOLN
INTRAMUSCULAR | Status: AC
  Filled 2015-03-27: qty 20

## 2015-03-27 MED ORDER — ENOXAPARIN SODIUM 40 MG/0.4ML ~~LOC~~ SOLN
40.0000 mg | SUBCUTANEOUS | Status: AC
  Administered 2015-03-27: 40 mg via SUBCUTANEOUS
  Filled 2015-03-27: qty 0.4

## 2015-03-27 MED ORDER — METOCLOPRAMIDE HCL 5 MG/ML IJ SOLN
10.0000 mg | Freq: Once | INTRAMUSCULAR | Status: AC | PRN
  Administered 2015-03-27: 10 mg via INTRAVENOUS

## 2015-03-27 MED ORDER — HYDROMORPHONE HCL 1 MG/ML IJ SOLN
INTRAMUSCULAR | Status: DC | PRN
  Administered 2015-03-27 (×2): 1 mg via INTRAVENOUS

## 2015-03-27 MED ORDER — SUGAMMADEX SODIUM 200 MG/2ML IV SOLN
INTRAVENOUS | Status: AC
  Filled 2015-03-27: qty 2

## 2015-03-27 MED ORDER — METOCLOPRAMIDE HCL 5 MG/ML IJ SOLN
INTRAMUSCULAR | Status: AC
  Filled 2015-03-27: qty 2

## 2015-03-27 MED ORDER — MAGNESIUM HYDROXIDE 400 MG/5ML PO SUSP
30.0000 mL | Freq: Three times a day (TID) | ORAL | Status: AC
  Administered 2015-03-28 (×2): 30 mL via ORAL
  Filled 2015-03-27 (×2): qty 30

## 2015-03-27 MED ORDER — ONDANSETRON HCL 4 MG PO TABS: 4.0000 mg | ORAL_TABLET | Freq: Four times a day (QID) | ORAL | Status: DC | PRN

## 2015-03-27 MED ORDER — DEXTROSE 5 % IV SOLN
INTRAVENOUS | Status: AC
  Filled 2015-03-27: qty 2

## 2015-03-27 MED ORDER — DEXAMETHASONE SODIUM PHOSPHATE 10 MG/ML IJ SOLN
INTRAMUSCULAR | Status: AC
  Filled 2015-03-27: qty 1

## 2015-03-27 MED ORDER — LIDOCAINE HCL (CARDIAC) 20 MG/ML IV SOLN
INTRAVENOUS | Status: AC
  Filled 2015-03-27: qty 5

## 2015-03-27 MED ORDER — PHENYLEPHRINE HCL 10 MG/ML IJ SOLN
INTRAMUSCULAR | Status: DC | PRN
  Administered 2015-03-27: 120 ug via INTRAVENOUS
  Administered 2015-03-27 (×2): 80 ug via INTRAVENOUS

## 2015-03-27 MED ORDER — SUGAMMADEX SODIUM 200 MG/2ML IV SOLN
INTRAVENOUS | Status: DC | PRN
  Administered 2015-03-27: 100 mg via INTRAVENOUS

## 2015-03-27 MED ORDER — LACTATED RINGERS IV SOLN
INTRAVENOUS | Status: DC
  Administered 2015-03-27: 16:00:00 via INTRAVENOUS

## 2015-03-27 MED ORDER — LORAZEPAM 0.5 MG PO TABS: 0.5000 mg | ORAL_TABLET | Freq: Every day | ORAL | Status: DC | PRN

## 2015-03-27 MED ORDER — KETOROLAC TROMETHAMINE 30 MG/ML IJ SOLN
15.0000 mg | Freq: Four times a day (QID) | INTRAMUSCULAR | Status: DC
  Filled 2015-03-27: qty 1

## 2015-03-27 MED ORDER — GABAPENTIN 300 MG PO CAPS
600.0000 mg | ORAL_CAPSULE | Freq: Every day | ORAL | Status: AC
  Administered 2015-03-27: 600 mg via ORAL
  Filled 2015-03-27: qty 2

## 2015-03-27 MED ORDER — SODIUM CHLORIDE 0.9 % IJ SOLN
INTRAMUSCULAR | Status: DC | PRN
  Administered 2015-03-27: 20 mL

## 2015-03-27 MED ORDER — LACTATED RINGERS IV SOLN
INTRAVENOUS | Status: DC | PRN
Start: 2015-03-27 — End: 2015-03-27
  Administered 2015-03-27 (×3): via INTRAVENOUS

## 2015-03-27 MED ORDER — MIDAZOLAM HCL 2 MG/2ML IJ SOLN
INTRAMUSCULAR | Status: AC
  Filled 2015-03-27: qty 2

## 2015-03-27 MED ORDER — SCOPOLAMINE 1 MG/3DAYS TD PT72
MEDICATED_PATCH | TRANSDERMAL | Status: AC
  Filled 2015-03-27: qty 1

## 2015-03-27 MED ORDER — SUFENTANIL CITRATE 50 MCG/ML IV SOLN
INTRAVENOUS | Status: DC | PRN
  Administered 2015-03-27: 10 ug via INTRAVENOUS
  Administered 2015-03-27 (×3): 5 ug via INTRAVENOUS

## 2015-03-27 MED ORDER — DEXTROSE 5 % IV SOLN
2.0000 g | INTRAVENOUS | Status: AC
  Administered 2015-03-27 (×2): 2 g via INTRAVENOUS

## 2015-03-27 MED ORDER — PROMETHAZINE HCL 25 MG/ML IJ SOLN
6.2500 mg | INTRAMUSCULAR | Status: AC | PRN
  Administered 2015-03-27 (×2): 6.25 mg via INTRAVENOUS

## 2015-03-27 MED ORDER — KETOROLAC TROMETHAMINE 30 MG/ML IJ SOLN
15.0000 mg | Freq: Four times a day (QID) | INTRAMUSCULAR | Status: DC
  Administered 2015-03-27 – 2015-03-28 (×3): 15 mg via INTRAVENOUS
  Filled 2015-03-27 (×3): qty 1

## 2015-03-27 MED ORDER — MIDAZOLAM HCL 5 MG/5ML IJ SOLN
INTRAMUSCULAR | Status: DC | PRN
Start: 2015-03-27 — End: 2015-03-27
  Administered 2015-03-27: 2 mg via INTRAVENOUS

## 2015-03-27 MED ORDER — ACETAMINOPHEN 10 MG/ML IV SOLN
INTRAVENOUS | Status: AC
  Filled 2015-03-27: qty 100

## 2015-03-27 MED ORDER — ENOXAPARIN SODIUM 40 MG/0.4ML ~~LOC~~ SOLN
40.0000 mg | SUBCUTANEOUS | Status: DC
  Administered 2015-03-28 – 2015-03-29 (×2): 40 mg via SUBCUTANEOUS
  Filled 2015-03-27 (×3): qty 0.4

## 2015-03-27 MED ORDER — DIPHENHYDRAMINE HCL 50 MG/ML IJ SOLN
INTRAMUSCULAR | Status: AC
  Filled 2015-03-27: qty 1

## 2015-03-27 MED ORDER — SCOPOLAMINE 1 MG/3DAYS TD PT72
MEDICATED_PATCH | TRANSDERMAL | Status: DC | PRN
  Administered 2015-03-27: 1 via TRANSDERMAL

## 2015-03-27 MED ORDER — HYDROMORPHONE HCL 2 MG/ML IJ SOLN
INTRAMUSCULAR | Status: AC
  Filled 2015-03-27: qty 1

## 2015-03-27 MED ORDER — PROPOFOL 10 MG/ML IV BOLUS
INTRAVENOUS | Status: DC | PRN
  Administered 2015-03-27: 150 mg via INTRAVENOUS
  Administered 2015-03-27: 40 mg via INTRAVENOUS

## 2015-03-27 MED ORDER — PROMETHAZINE HCL 25 MG/ML IJ SOLN
INTRAMUSCULAR | Status: AC
  Filled 2015-03-27: qty 1

## 2015-03-27 MED ORDER — SODIUM CHLORIDE 0.9 % IJ SOLN
INTRAMUSCULAR | Status: AC
  Filled 2015-03-27: qty 200

## 2015-03-27 MED ORDER — 0.9 % SODIUM CHLORIDE (POUR BTL) OPTIME
TOPICAL | Status: DC | PRN
  Administered 2015-03-27: 1000 mL

## 2015-03-27 MED ORDER — KETOROLAC TROMETHAMINE 15 MG/ML IJ SOLN
INTRAMUSCULAR | Status: AC
  Filled 2015-03-27: qty 1

## 2015-03-27 MED ORDER — 0.9 % SODIUM CHLORIDE (POUR BTL) OPTIME
TOPICAL | Status: DC | PRN
  Administered 2015-03-27: 100 mL

## 2015-03-27 MED ORDER — ACETAMINOPHEN 500 MG PO TABS
1000.0000 mg | ORAL_TABLET | Freq: Four times a day (QID) | ORAL | Status: DC
  Administered 2015-03-28 – 2015-03-29 (×5): 1000 mg via ORAL
  Filled 2015-03-27 (×10): qty 2

## 2015-03-27 MED ORDER — KETOROLAC TROMETHAMINE 30 MG/ML IJ SOLN
INTRAMUSCULAR | Status: AC
  Filled 2015-03-27: qty 1

## 2015-03-27 MED ORDER — CILIDINIUM-CHLORDIAZEPOXIDE 2.5-5 MG PO CAPS
1.0000 | ORAL_CAPSULE | Freq: Every day | ORAL | Status: DC
  Administered 2015-03-27: 1 via ORAL
  Filled 2015-03-27 (×4): qty 1

## 2015-03-27 MED ORDER — DEXAMETHASONE SODIUM PHOSPHATE 10 MG/ML IJ SOLN
INTRAMUSCULAR | Status: DC | PRN
  Administered 2015-03-27: 10 mg via INTRAVENOUS

## 2015-03-27 MED ORDER — KCL IN DEXTROSE-NACL 20-5-0.45 MEQ/L-%-% IV SOLN
INTRAVENOUS | Status: DC
  Administered 2015-03-27: 20:00:00 via INTRAVENOUS
  Filled 2015-03-27 (×3): qty 1000

## 2015-03-27 MED ORDER — ONDANSETRON HCL 4 MG/2ML IJ SOLN
INTRAMUSCULAR | Status: AC
  Filled 2015-03-27: qty 2

## 2015-03-27 MED ORDER — PROPOFOL 10 MG/ML IV BOLUS
INTRAVENOUS | Status: AC
  Filled 2015-03-27: qty 20

## 2015-03-27 MED ORDER — CEFOXITIN SODIUM 2 G IV SOLR
INTRAVENOUS | Status: AC
  Filled 2015-03-27: qty 2

## 2015-03-27 MED ORDER — OXYCODONE HCL 5 MG PO TABS
10.0000 mg | ORAL_TABLET | ORAL | Status: DC | PRN
  Administered 2015-03-27 (×2): 5 mg via ORAL
  Administered 2015-03-28 – 2015-03-29 (×4): 10 mg via ORAL
  Filled 2015-03-27 (×6): qty 2

## 2015-03-27 MED ORDER — ONDANSETRON HCL 4 MG/2ML IJ SOLN
4.0000 mg | Freq: Four times a day (QID) | INTRAMUSCULAR | Status: DC | PRN
  Administered 2015-03-27: 4 mg via INTRAVENOUS
  Filled 2015-03-27: qty 2

## 2015-03-27 MED ORDER — PHENYLEPHRINE 40 MCG/ML (10ML) SYRINGE FOR IV PUSH (FOR BLOOD PRESSURE SUPPORT)
PREFILLED_SYRINGE | INTRAVENOUS | Status: AC
  Filled 2015-03-27: qty 10

## 2015-03-27 MED ORDER — ROCURONIUM BROMIDE 100 MG/10ML IV SOLN
INTRAVENOUS | Status: AC
  Filled 2015-03-27: qty 1

## 2015-03-27 SURGICAL SUPPLY — 82 items
ADH SKN CLS APL DERMABOND .7 (GAUZE/BANDAGES/DRESSINGS) ×2
ATTRACTOMAT 16X20 MAGNETIC DRP (DRAPES) ×1 IMPLANT
BAG SPEC RTRVL LRG 6X4 10 (ENDOMECHANICALS)
BANDAGE ADH SHEER 1  50/CT (GAUZE/BANDAGES/DRESSINGS) ×2 IMPLANT
BLADE EXTENDED COATED 6.5IN (ELECTRODE) ×1 IMPLANT
CABLE HIGH FREQUENCY MONO STRZ (ELECTRODE) IMPLANT
CELLS DAT CNTRL 66122 CELL SVR (MISCELLANEOUS) IMPLANT
CHLORAPREP W/TINT 26ML (MISCELLANEOUS) ×3 IMPLANT
CLIP TI LARGE 6 (CLIP) ×2 IMPLANT
CLIP TI MEDIUM 6 (CLIP) ×2 IMPLANT
CLIP TI MEDIUM LARGE 6 (CLIP) IMPLANT
CONT SPEC 4OZ CLIKSEAL STRL BL (MISCELLANEOUS) IMPLANT
COVER SURGICAL LIGHT HANDLE (MISCELLANEOUS) ×2 IMPLANT
DERMABOND ADVANCED (GAUZE/BANDAGES/DRESSINGS) ×1
DERMABOND ADVANCED .7 DNX12 (GAUZE/BANDAGES/DRESSINGS) IMPLANT
DRAPE INCISE IOBAN 66X45 STRL (DRAPES) ×1 IMPLANT
DRAPE UTILITY XL STRL (DRAPES) IMPLANT
DRAPE WARM FLUID 44X44 (DRAPE) ×3 IMPLANT
DRSG OPSITE POSTOP 4X10 (GAUZE/BANDAGES/DRESSINGS) ×1 IMPLANT
ELECT LIGASURE SHORT 9 REUSE (ELECTRODE) IMPLANT
ELECT PENCIL ROCKER SW 15FT (MISCELLANEOUS) ×3 IMPLANT
ELECT REM PT RETURN 9FT ADLT (ELECTROSURGICAL) ×6
ELECTRODE REM PT RTRN 9FT ADLT (ELECTROSURGICAL) ×2 IMPLANT
GAUZE SPONGE 4X4 16PLY XRAY LF (GAUZE/BANDAGES/DRESSINGS) IMPLANT
GLOVE BIO SURGEON STRL SZ 6 (GLOVE) ×6 IMPLANT
GLOVE BIO SURGEON STRL SZ 6.5 (GLOVE) ×6 IMPLANT
GLOVE BIO SURGEON STRL SZ7 (GLOVE) ×2 IMPLANT
GOWN STRL NON-REIN LRG LVL3 (GOWN DISPOSABLE) ×4 IMPLANT
GOWN STRL REUS W/ TWL LRG LVL3 (GOWN DISPOSABLE) ×4 IMPLANT
GOWN STRL REUS W/TWL LRG LVL3 (GOWN DISPOSABLE) ×6
HANDLE SUCTION POOLE (INSTRUMENTS) ×2 IMPLANT
HEMOSTAT SURGICEL 4X8 (HEMOSTASIS) ×2 IMPLANT
HOLDER FOLEY CATH W/STRAP (MISCELLANEOUS) IMPLANT
KIT BASIN OR (CUSTOM PROCEDURE TRAY) ×3 IMPLANT
LIGASURE IMPACT 36 18CM CVD LR (INSTRUMENTS) ×1 IMPLANT
LIQUID BAND (GAUZE/BANDAGES/DRESSINGS) ×2 IMPLANT
LOOP VESSEL MAXI BLUE (MISCELLANEOUS) IMPLANT
MANIPULATOR UTERINE 4.5 ZUMI (MISCELLANEOUS) IMPLANT
NDL BLUNT 17GA (NEEDLE) ×6 IMPLANT
NEEDLE BLUNT 17GA (NEEDLE) ×6 IMPLANT
NS IRRIG 1000ML POUR BTL (IV SOLUTION) ×6 IMPLANT
PACK GENERAL/GYN (CUSTOM PROCEDURE TRAY) ×3 IMPLANT
PAD POSITIONING PINK XL (MISCELLANEOUS) ×1 IMPLANT
POUCH SPECIMEN RETRIEVAL 10MM (ENDOMECHANICALS) IMPLANT
RETRACTOR WND ALEXIS 18 MED (MISCELLANEOUS) ×2 IMPLANT
RETRACTOR WND ALEXIS 25 LRG (MISCELLANEOUS) IMPLANT
RTRCTR WOUND ALEXIS 18CM MED (MISCELLANEOUS)
RTRCTR WOUND ALEXIS 25CM LRG (MISCELLANEOUS)
SCISSORS LAP 5X35 DISP (ENDOMECHANICALS) IMPLANT
SET IRRIG TUBING LAPAROSCOPIC (IRRIGATION / IRRIGATOR) ×1 IMPLANT
SHEET LAVH (DRAPES) ×3 IMPLANT
SLEEVE XCEL OPT CAN 5 100 (ENDOMECHANICALS) ×1 IMPLANT
SPONGE LAP 18X18 X RAY DECT (DISPOSABLE) ×3 IMPLANT
STAPLER VISISTAT 35W (STAPLE) IMPLANT
SUCTION POOLE HANDLE (INSTRUMENTS)
SUT MNCRL AB 4-0 PS2 18 (SUTURE) ×2 IMPLANT
SUT PDS AB 1 TP1 96 (SUTURE) ×6 IMPLANT
SUT VIC AB 0 CT1 36 (SUTURE) ×6 IMPLANT
SUT VIC AB 2-0 CT1 36 (SUTURE) ×6 IMPLANT
SUT VIC AB 2-0 CT2 27 (SUTURE) ×14 IMPLANT
SUT VIC AB 2-0 SH 27 (SUTURE)
SUT VIC AB 2-0 SH 27X BRD (SUTURE) IMPLANT
SUT VIC AB 3-0 CTX 36 (SUTURE) IMPLANT
SUT VIC AB 3-0 PS2 18 (SUTURE)
SUT VIC AB 3-0 PS2 18XBRD (SUTURE) IMPLANT
SUT VIC AB 3-0 SH 27 (SUTURE) ×3
SUT VIC AB 3-0 SH 27X BRD (SUTURE) ×2 IMPLANT
SYR 50ML LL SCALE MARK (SYRINGE) ×9 IMPLANT
TOWEL OR 17X26 10 PK STRL BLUE (TOWEL DISPOSABLE) ×2 IMPLANT
TOWEL OR NON WOVEN STRL DISP B (DISPOSABLE) ×3 IMPLANT
TRAP SPECIMEN MUCOUS 40CC (MISCELLANEOUS) IMPLANT
TRAY FOLEY BAG SILVER LF 14FR (CATHETERS) ×3 IMPLANT
TRAY FOLEY W/METER SILVER 14FR (SET/KITS/TRAYS/PACK) ×3 IMPLANT
TRAY LAPAROSCOPIC (CUSTOM PROCEDURE TRAY) ×3 IMPLANT
TROCAR BLADELESS OPT 5 75 (ENDOMECHANICALS) ×3 IMPLANT
TROCAR SLEEVE XCEL 5X75 (ENDOMECHANICALS) ×1 IMPLANT
TROCAR XCEL 12X100 BLDLESS (ENDOMECHANICALS) ×3 IMPLANT
TROCAR XCEL BLUNT TIP 100MML (ENDOMECHANICALS) IMPLANT
TUBING INSUFFLATION 10FT LAP (TUBING) ×3 IMPLANT
TUBING NON-CON 1/4 X 20 CONN (TUBING) IMPLANT
UNDERPAD 30X30 INCONTINENT (UNDERPADS AND DIAPERS) ×3 IMPLANT
YANKAUER SUCT BULB TIP NO VENT (SUCTIONS) ×3 IMPLANT
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
## 2015-03-27 NOTE — Op Note (Signed)
OPERATIVE NOTE 03/27/15  Preoperative Diagnosis: 1. Peritoneal carcinomatosis 2. Elevated CA 125   Postoperative Diagnosis: Stage IIIC primary peritoneal cancer    Procedure(s) Performed: 1. Diagnostic laparoscopy. 2. Exploratory laparotomy with bilateral salpingo-oophorectomy, omentectomy radical tumor debulking for ovarian cancer .  Surgeon: Thereasa Solo, MD.  Assistant Surgeon: Dr Lahoma Crocker, M.D. Assistant: (an MD assistant was necessary for tissue manipulation, retraction and positioning due to the complexity of the case and hospital policies).   Specimens: Bilateral tubes / ovaries, omentum. Peritoneal nodules   Estimated Blood Loss: 200 mL.    Urine [id number]  Complications: None.   Operative Findings: 500cc of ascites. Diffuse peritoneal studding and studding of small intestine, sigmoid colon and transverse colon with 90mm tumor implants. Extensive peritoneal studding of peritoneal surface of pelvis and posterior cul de sac. Filmy tumor coating right ovary. Left ovary, with surface nodularity but no masses. Tubes grossly normal. 6cm omental cake of tumor. Tumor nodules (38mm) studding right diaphragm   This represented an optimal cytoreduction (R0) with no gross visible disease remaining.   Procedure:   The patient was seen in the Holding Room. The risks, benefits, complications, treatment options, and expected outcomes were discussed with the patient.  The patient concurred with the proposed plan, giving informed consent.   The patient was  identified as Kathryn Ramsey  and the procedure verified as BSO, omentectomy, tumor debulking. A Time Out was held and the above information confirmed upon entry to the operating room..  After induction of anesthesia, the patient was draped and prepped in the usual sterile manner.  She was prepped and draped in the normal sterile fashion in the dorsal lithotomy position in padded Allen stirrups with good attention paid to support of the lower  back and lower extremities. Position was adjusted for appropriate support. A Foley catheter was placed to gravity. Arms were tucked and padded at her sides.  The patient was draped. A left upper quadrant incision (30mm) was made at Palmer's point and the 89mm scope was introduced into this with a 97mm optiview entry took place. Upon entry into the peritoneal cavity the above stated findings were noted. An additional 48mm port was placed in the right paramedian location at a location to be introduced into the final incision. Through this port the argon beam coagulation tip was inserted. The patient was placed in reverse trendelenberg to expose the left and right diaphragms, and laparoscopic ablation of the left and right diaphragmatic implants took place with the argon beam coagulator set at 80. The patient was then laid flat and the laparoscopic instruments were removed. Because the disease was determined to be feasable to optimal cytoreduction, surgery proceeded with a laparotomy.   A midline vertical incision was made and carried through the subcutaneous tissue to the fascia. The fascial incision was made and extended superiorally. The rectus muscles were separated. The peritoneum was identified and entered. Peritoneal incision was extended longitudinally.  The abdominal cavity was entered sharply and without incident. A Bookwalter retractor was then placed. A survey of the abdomen and pelvis revealed the above findings, which were significant for diffuse millial studding of tumor on the small and large intestines and pelvic peritoneum, and tumor deposits on the surface of the otherwise normal sized ovaries, omental cake of tumor, and 500cc ascites.  The omental cake was dissected free from the transverse colon from the hepatic flexure to the splenic flexure using sharp metzenbaum scissor dissection. The lesser sac was entered. The tumor cake  was separated from the mesentery of the transverse colon. The short  gastric vessels were sealed with ligasure and the infragastric omentum was separated from the greater curvature of the stomach removing all bulky tumor. Hemostasis was confirmed. The colon was closely inspected and was noted to be intact and hemostatic.   After packing the small bowel into the upper abdomen, we peformed right salpingo-oophorectomy by entering the  pelvic sidewall just posterior to the right round ligament. The pararectal space was developed and the retroperitoneum developed up to the level of the common iliac artery.  The course of the ureter was identified with ease. The right IP was then skeletonized, and sealed and transected with the bipolar device. The ovary was separated from its peritoneal attachments with the bovie with visualization of the ureter at all times and stripping of the right side wall peritoneum with attached nodules took place. The ovary was separated from the vaginal cuff/cardinal ligament attatchments using the ligasure.   The sigmoid colon was dissected from its dense tumor attachments to the left ovary using sharp dissection. The left retroperitoneal peritoneum was entered parallel to the sigmoid colon attachments and the left ureter was identified in the left retroperitoneal space. Using sharp and monopolar dissection, the left tube and ovary were freed from their peritoneal adhesions to the pelvis and sigmoid colon. The IP ligament was sealed with the ligasure. The vaginal cuff/cardinal ligament attachments were taken down with ligasure. The peritoneal nodules on the left pelvic side wall were included with the ovarian specimen.  For approximately 90 minutes argon beam coagulation (set to 60) was employed to ablate the peritoneal nodules on the peritoneal surfaces of the pelvis including the posterior cul de sac and sigmoid colon mesentery and colonic serosa. The argon beam was then used to ablate the nodules on the terminal ileum, ileum, jejunum, and transverse  colon. The abdomen and pelvis was closely inspected for residual nodules, none were identified. No visible residual tumor was identified.  The peritoneal cavity was irrigated and hemostasis was confirmed at all surgical sites.   The fascia was reapproximated with 0 looped PDS using a total of two sutures. The subcutaneous layer was then irrigated copiously.  Exparel long acting local anesthetic was infiltrated into the subcutaneous tissues. The skin was closed with staples. The patient tolerated the procedure well.   Sponge, lap and needle counts were correct x 2.   [name], MD

## 2015-03-27 NOTE — Anesthesia Postprocedure Evaluation (Signed)
Anesthesia Post Note  Patient: Kathryn Ramsey  Procedure(s) Performed: Procedure(s) (LRB): DIAGNOSTIC LAPAROSCOPY  (N/A) EXPLORATORY LAPAROTOMY, BILATERAL SALPINGO OOPHORECTOMY, OMENTECTOMY, RADICAL TUMOR DEBULKING (N/A)  Patient location during evaluation: PACU Anesthesia Type: General Level of consciousness: awake and alert Pain management: pain level controlled Vital Signs Assessment: post-procedure vital signs reviewed and stable Respiratory status: spontaneous breathing, nonlabored ventilation, respiratory function stable and patient connected to nasal cannula oxygen Cardiovascular status: blood pressure returned to baseline and stable Postop Assessment: no signs of nausea or vomiting Anesthetic complications: no    Last Vitals:  Filed Vitals:   03/27/15 1745 03/27/15 1846  BP: 93/55 90/52  Pulse: 63 62  Temp: 37.4 C 36.9 C  Resp: 14 14    Last Pain:  Filed Vitals:   03/27/15 1846  PainSc: 5                  Tonyetta Berko L

## 2015-03-27 NOTE — Interval H&P Note (Signed)
History and Physical Interval Note:  03/27/2015 10:25 AM  Kathryn Ramsey  has presented today for surgery, with the diagnosis of PERITONEAL CARCINOMA TOSIS   The various methods of treatment have been discussed with the patient and family. After consideration of risks, benefits and other options for treatment, the patient has consented to  Procedure(s): DIAGNOSTIC LAPAROSCOPY  (N/A) POSSIBLE EXPLORATORY LAPAROTOMY POSSIBLE BILATERAL SALPINGO OOPHORECTOMY POSSIBLE OMENTECTOMY POSSIBLE DEBULKING  (N/A) as a surgical intervention .  The patient's history has been reviewed, patient examined, no change in status, stable for surgery.  I have reviewed the patient's chart and labs. CT scan of the chest was negative for metastatic disease. Questions were answered to the patient's satisfaction.     [name]

## 2015-03-27 NOTE — Anesthesia Procedure Notes (Signed)
Procedure Name: Intubation Date/Time: 03/27/2015 1:09 PM Performed by: Lissa Morales Pre-anesthesia Checklist: Patient identified, Emergency Drugs available, Suction available and Patient being monitored Patient Re-evaluated:Patient Re-evaluated prior to inductionOxygen Delivery Method: Circle System Utilized Preoxygenation: Pre-oxygenation with 100% oxygen Intubation Type: IV induction Ventilation: Mask ventilation without difficulty Laryngoscope Size: Mac and 3 Grade View: Grade I Tube type: Oral Tube size: 7.0 mm Number of attempts: 1 Airway Equipment and Method: Stylet and Oral airway Placement Confirmation: ETT inserted through vocal cords under direct vision,  positive ETCO2 and breath sounds checked- equal and bilateral Secured at: 21 cm Tube secured with: Tape Dental Injury: Teeth and Oropharynx as per pre-operative assessment

## 2015-03-27 NOTE — Transfer of Care (Signed)
Immediate Anesthesia Transfer of Care Note  Patient: Kathryn Ramsey  Procedure(s) Performed: Procedure(s): DIAGNOSTIC LAPAROSCOPY  (N/A) EXPLORATORY LAPAROTOMY, BILATERAL SALPINGO OOPHORECTOMY, OMENTECTOMY, RADICAL TUMOR DEBULKING (N/A)  Patient Location: PACU  Anesthesia Type:General  Level of Consciousness: awake, alert , oriented and patient cooperative  Airway & Oxygen Therapy: Patient Spontanous Breathing and Patient connected to face mask oxygen  Post-op Assessment: Report given to RN, Post -op Vital signs reviewed and stable and Patient moving all extremities X 4  Post vital signs: stable  Last Vitals:  Filed Vitals:   03/27/15 1009  BP: 127/68  Pulse: 73  Temp: 36.4 C  Resp: 16    Complications: No apparent anesthesia complications

## 2015-03-27 NOTE — Anesthesia Preprocedure Evaluation (Signed)
Anesthesia Evaluation  Patient identified by MRN, date of birth, ID band Patient awake    Reviewed: Allergy & Precautions, H&P , NPO status , Patient's Chart, lab work & pertinent test results  History of Anesthesia Complications (+) PONV  Airway Mallampati: II  TM Distance: >3 FB Neck ROM: full    Dental no notable dental hx. (+) Dental Advisory Given, Teeth Intact   Pulmonary neg pulmonary ROS, asthma ,  URTI induced asthma   Pulmonary exam normal breath sounds clear to auscultation       Cardiovascular Exercise Tolerance: Good negative cardio ROS Normal cardiovascular exam Rhythm:regular Rate:Normal     Neuro/Psych negative neurological ROS  negative psych ROS   GI/Hepatic negative GI ROS, Neg liver ROS,   Endo/Other  negative endocrine ROS  Renal/GU negative Renal ROS  negative genitourinary   Musculoskeletal   Abdominal   Peds  Hematology negative hematology ROS (+)   Anesthesia Other Findings   Reproductive/Obstetrics negative OB ROS                             Anesthesia Physical Anesthesia Plan  ASA: II  Anesthesia Plan: General   Post-op Pain Management:    Induction: Intravenous  Airway Management Planned: Oral ETT  Additional Equipment:   Intra-op Plan:   Post-operative Plan: Extubation in OR  Informed Consent: I have reviewed the patients History and Physical, chart, labs and discussed the procedure including the risks, benefits and alternatives for the proposed anesthesia with the patient or authorized representative who has indicated his/her understanding and acceptance.   Dental Advisory Given  Plan Discussed with: CRNA and Surgeon  Anesthesia Plan Comments:         Anesthesia Quick Evaluation

## 2015-03-28 LAB — BASIC METABOLIC PANEL
Anion gap: 5 (ref 5–15)
BUN: 8 mg/dL (ref 6–20)
CO2: 26 mmol/L (ref 22–32)
Calcium: 7.3 mg/dL — ABNORMAL LOW (ref 8.9–10.3)
Chloride: 105 mmol/L (ref 101–111)
Creatinine, Ser: 0.73 mg/dL (ref 0.44–1.00)
GFR calc Af Amer: >60 mL/min (ref >60)
GFR calc non Af Amer: >60 mL/min (ref >60)
Glucose, Bld: 138 mg/dL — ABNORMAL HIGH (ref 65–99)
Potassium: 4.4 mmol/L (ref 3.5–5.1)
Sodium: 136 mmol/L (ref 135–145)

## 2015-03-28 LAB — CBC
HCT: 34.1 % — ABNORMAL LOW (ref 36.0–46.0)
Hemoglobin: 11.3 g/dL — ABNORMAL LOW (ref 12.0–15.0)
MCH: 31 pg (ref 26.0–34.0)
MCHC: 33.1 g/dL (ref 30.0–36.0)
MCV: 93.7 fL (ref 78.0–100.0)
Platelets: 229 10*3/uL (ref 150–400)
RBC: 3.64 MIL/uL — ABNORMAL LOW (ref 3.87–5.11)
RDW: 12.7 % (ref 11.5–15.5)
WBC: 7.4 10*3/uL (ref 4.0–10.5)

## 2015-03-28 MED ORDER — IBUPROFEN 600 MG PO TABS
600.0000 mg | ORAL_TABLET | Freq: Four times a day (QID) | ORAL | Status: DC
  Administered 2015-03-28 – 2015-03-29 (×3): 600 mg via ORAL
  Filled 2015-03-28 (×8): qty 1

## 2015-03-28 MED ORDER — ENOXAPARIN (LOVENOX) PATIENT EDUCATION KIT
PACK | Freq: Once | Status: AC
  Administered 2015-03-28: 12:00:00
  Filled 2015-03-28: qty 1

## 2015-03-28 NOTE — Progress Notes (Signed)
1 Day Post-Op Procedure(s) (LRB): DIAGNOSTIC LAPAROSCOPY  (N/A) EXPLORATORY LAPAROTOMY, BILATERAL SALPINGO OOPHORECTOMY, OMENTECTOMY, RADICAL TUMOR DEBULKING (N/A)  Subjective: Patient reports doing well.  Tolerating liquids but stating she feels "dry".  Ambulating without difficulty.  Minimal pain reported.  Voiding without difficulty.  Denies chest pain, dyspnea, passing flatus, or having a bowel movement.  No concerns voiced.  Objective: Vital signs in last 24 hours: Temp:  [97.3 F (36.3 C)-99.3 F (37.4 C)] 98.6 F (37 C) (02/10 0458) Pulse Rate:  [60-73] 70 (02/10 0458) Resp:  [9-16] 16 (02/10 0458) BP: (90-127)/(46-77) 90/46 mmHg (02/10 0458) SpO2:  [98 %-100 %] 98 % (02/10 0458) Weight:  [115 lb (52.164 kg)] 115 lb (52.164 kg) (02/09 1043) Last BM Date: 03/27/15  Intake/Output from previous day: 02/09 0701 - 02/10 0700 In: 4352.5 [P.O.:740; I.V.:3612.5] Out: 2300 [Urine:1900; Blood:200]  Physical Examination: General: alert, cooperative and no distress Resp: clear to auscultation bilaterally Cardio: regular rate and rhythm, S1, S2 normal, no murmur, click, rub or gallop GI: incision: midline incision with honeycomb dressing intact and abdomen soft, non-distended, hypoactive bowel sounds Extremities: extremities normal, atraumatic, no cyanosis or edema  Labs: WBC/Hgb/Hct/Plts:  7.4/11.3/34.1/229 (02/10 0442) BUN/Cr/glu/ALT/AST/amyl/lip:  8/0.73/--/--/--/--/-- (02/10 [id number])  Assessment: 50 y.o. s/p Procedure(s): DIAGNOSTIC LAPAROSCOPY  EXPLORATORY LAPAROTOMY, BILATERAL SALPINGO OOPHORECTOMY, OMENTECTOMY, RADICAL TUMOR DEBULKING: stable Pain:  Pain is well-controlled on PRN medications.  Heme: Hgb 11.3 and Hct 34.1 this am.  Stable post-operatively.  CV:  BP and HR stable at this time.  Hypotensive at times but asymptomatic.  Continue to monitor with ordered vital signs.  GI:  Tolerating po: Yes     GU: Adequate output reported.    FEN: Stable  post-operatively.  Prophylaxis: pharmacologic prophylaxis (with any of the following: enoxaparin (Lovenox) 40mg  SQ 2 hours prior to surgery then every day) and intermittent pneumatic compression boots.  Plan: Advance diet as tolerated Saline lock IV if diet tolerated later today Begin ibuprofen and discontinue toradol Encourage ambulation, IS use, deep breathing, and coughing Continue post-operative plan of care per Dr. Delsa Sale   LOS: 1 day    CROSS, MELISSA DEAL 03/28/2015, 9:22 AM

## 2015-03-28 NOTE — Progress Notes (Signed)
03/28/15 1600 Patient has not walked in hall today. Educated her on importance of doing so (to prevent DVTs and PNA). Pt voiced understanding with teach back and stated she will walk when her husband returns.---Donne Hazel, RN

## 2015-03-29 LAB — URINALYSIS, ROUTINE W REFLEX MICROSCOPIC
Bilirubin Urine: NEGATIVE
Glucose, UA: NEGATIVE mg/dL
Ketones, ur: NEGATIVE mg/dL
Leukocytes, UA: NEGATIVE
Nitrite: NEGATIVE
Protein, ur: NEGATIVE mg/dL
Specific Gravity, Urine: 1.009 (ref 1.005–1.030)
pH: 7.5 (ref 5.0–8.0)

## 2015-03-29 LAB — URINE MICROSCOPIC-ADD ON: Bacteria, UA: NONE SEEN

## 2015-03-29 MED ORDER — OXYCODONE HCL 10 MG PO TABS: 10.0000 mg | ORAL_TABLET | ORAL | Status: DC | PRN

## 2015-03-29 MED ORDER — ENOXAPARIN SODIUM 40 MG/0.4ML ~~LOC~~ SOLN: 40.0000 mg | SUBCUTANEOUS | Status: DC

## 2015-03-29 NOTE — Progress Notes (Signed)
Made MD aware of low BP, especially after taking pain meds.

## 2015-03-29 NOTE — Discharge Instructions (Signed)
Bilateral Salpingo-Oophorectomy, Care After Refer to this sheet in the next few weeks. These instructions provide you with information on caring for yourself after your procedure. Your health care provider may also give you more specific instructions. Your treatment has been planned according to current medical practices, but problems sometimes occur. Call your health care provider if you have any problems or questions after your procedure. WHAT TO EXPECT AFTER THE PROCEDURE After your procedure, it is typical to have the following:   Abdominal pain that can be controlled with medicine.  Vaginal spotting.  Constipation.  Menopausal symptoms such as hot flashes, vaginal dryness, and mood swings. HOME CARE INSTRUCTIONS   Get plenty of rest and sleep.  Only take over-the-counter or prescription medicines as directed by your health care provider. Do not take aspirin. It can cause bleeding.  Keep incision areas clean and dry. Remove or change bandages (dressings) only as directed by your health care provider.  Take showers instead of baths for a few weeks as directed by your health care provider.  Limit exercise and activities as directed by your health care provider. Do not lift anything heavier than 5 pounds (2.3 kg) until your health care provider approves.  Do not drive until your health care provider approves.  Follow your health care provider's advice regarding diet. You may be able to resume your usual diet right away.  Drink enough fluids to keep your urine clear or pale yellow.  Do not douche, use tampons, or have sexual intercourse for 6 weeks after the procedure.  Do not drink alcohol until your health care provider says it is okay.  Take your temperature twice a day and write it down.  If you become constipated, you may:  Ask your health care provider about taking a mild laxative.  Add more fruit and bran to your diet.  Drink more fluids.  Follow up with your health  care provider as directed. SEEK MEDICAL CARE IF:   You have swelling, redness, or increasing pain in the incision area.  You see pus coming from the incision area.  You notice a bad smell coming from the wound or dressing.  You have pain, redness, or swelling where the IV access tube was placed.  Your incision is breaking open (the edges are not staying together).  You feel dizzy or feel like fainting.  You develop pain or bleeding when you urinate.  You develop diarrhea.  You develop nausea and vomiting.  You develop abnormal vaginal discharge.  You develop a rash.  You have pain that is not controlled with medicine. SEEK IMMEDIATE MEDICAL CARE IF:   You develop a fever.  You develop abdominal pain.  You have chest pain.  You develop shortness of breath.  You pass out.  You develop pain, swelling, or redness in your leg.  You develop heavy vaginal bleeding with or without blood clots.   This information is not intended to replace advice given to you by your health care provider. Make sure you discuss any questions you have with your health care provider.   Document Released: 02/01/2005 Document Revised: 10/04/2012 Document Reviewed: 07/26/2012 Elsevier Interactive Patient Education [employer].

## 2015-03-29 NOTE — Progress Notes (Signed)
Assessment unchanged. Pt and husband verbalized understanding of dc instructions through teach back including follow up care and when to call the doctor. Husband administered SQ Lovenox this am with good aseptic technique. Script given as provided by MD. Discharged via wc to front entrance to meet awaiting vehicle to carry home. Accompanied by husband, son and nurse.

## 2015-03-29 NOTE — Discharge Summary (Signed)
Physician Discharge Summary  Patient ID: Kathryn Ramsey MRN: [id number] DOB/AGE: 05-06-1965 50 y.o.  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   The postoperative course was uneventful.  She was discharged to home on postoperative day 2 tolerating a regular diet.  Consults: None  Significant Diagnostic Studies: None  Treatments: surgery: see above  Discharge Exam: Blood pressure 97/42, pulse 63, temperature 97.8 F (36.6 C), temperature source Oral, resp. rate 18, height 5\' 3"  (1.6 m), weight 115 lb (52.164 kg), SpO2 97 %. General appearance: alert Resp: clear to auscultation bilaterally Cardio: regular rate and rhythm, S1, S2 normal, no murmur, click, rub or gallop GI: soft, non-tender; bowel sounds normal; no masses,  no organomegaly and small tympany Extremities: extremities normal, atraumatic, no cyanosis or edema and Homans sign is negative, no sign of DVT Incision/Wound: C/D/I  Disposition:   Discharge Instructions    Call MD for:  extreme fatigue    Complete by:  As directed      Call MD for:  persistant dizziness or light-headedness    Complete by:  As directed      Call MD for:  persistant nausea and vomiting    Complete by:  As directed      Call MD for:  redness, tenderness, or signs of infection (pain, swelling, redness, odor or green/yellow discharge around incision site)    Complete by:  As directed      Call MD for:  severe uncontrolled pain    Complete by:  As directed      Call MD for:  temperature >100.4    Complete by:  As directed      Diet  general    Complete by:  As directed      Discharge instructions    Complete by:  As directed   Planning for Recovery and Going Home Your Guide to Gynecologic Surgery    In-Hospital Recovery Plan Team Caring for You After Surgery In addition to the nursing staff on the unit, the gynecological surgery team will care for you. This team is led by your surgeon and includes a resident in his last year of training, as well as other residents, medical students and a physician assistant or nurse practitioner. There will be a physician in the hospital 24 hours a day to tend to your needs. The residents and students report directly to your surgeon, who is the one overseeing all of your care.  Pain Relief After Surgery Your pain will be assessed regularly on a scale from 0 to 10. Pain assessment is necessary to guide your pain relief. It is essential that you are able to take deep breaths, cough and move. Prevention or early treatment of pain is far more effective than trying to treat severe pain. Therefore, we have devised a specialized regimen to stay ahead of your pain and use almost no narcotics, which can slow down your recovery process. If you have an epidural catheter, you will receive a constant infusion of pain medication through your epidural. If you need additional pain relief, you will be able to push a button to increase the medication in your epidural. You will also be given acetaminophen  and an ibuprofen-like medication to keep your pain under control.  You can always ask for additional pain pills if you are not comfortable. In most cases an anesthesiologist with expertise in pain management will visit you every day and help design your pain management plan.  One Day After Surgery Focus on drinking and walking. You will start drinking clear liquids after surgery. The intravenous fluids will be stopped, and the catheter may be removed from your bladder. We expect you to get out  of bed, with the nurses' or assistants' help, sit in a chair for six hours and start to move about in the hallways. You will also meet with a case manager to assess your discharge needs, including home nursing. Your physician may order home care to assist with your transition home. Home nursing visits, which are intermittent, help you get readjusted to home by teaching treatments, monitoring medications, and performing clinical assessment and reporting back to your physician. Other services may include therapy and medical equipment; private duty services are also available. If you are going "home" to a different address upon discharge, please alert [name]. A Home Care Coordinator can visit with you while in the hospital to discuss your options. If you have questions please speak with your case manager. If you need rehabilitation at a facility, a social worker will assist with this. If you need rehabilitation at a facility, a social worker will assist with this. If your procedure was performed in a minimally invasive fashion, you will be discharged to home if your pain is well controlled and you are tolerating a regular diet.     Two Days After Surgery You will start eating a soft diet and change to a more solid diet as you feel up to it. The catheter from your bladder will be removed, if not already done so. If there is a dressing on your wound, it will be removed. The tubing will be disconnected from your IV. We expect you to be out of bed for the majority of the day and walking at least three times in the hallway, with assistance as needed.  You may be discharged at this point if it is felt you are ready.   Three Days After Surgery You continue to eat your low residue diet. You may be ready to go home if you are drinking enough to keep yourself hydrated, your pain is well controlled, you are not belching or nauseated, you are passing gas and you are able to get around on your own. However, we  will not discharge you from the hospital until we are sure you are ready.  Discharge Discharge time is at 10 a.m. You will need to make arrangements for someone to accompany you home. You will not be released without someone present. Please keep in mind that we strive to get patients discharged as quickly as possible, but there may be delays for a variety of reasons. Complications That May Delay Discharge: ? Nausea and vomiting: It is very common to feel sick after your surgery. We give you medication to reduce this. However, if you do feel sick, you should reduce the amount you are taking by mouth. Small, frequent meals or drinks are best in this situation. As long as you can drink and keep yourself hydrated, the nausea will likely pass. ? ???Ileus:?Following surgery, the bowel can be sluggish, making it difficult for food and gas to pass through the intestines. This is called an ileus. We have designed our care  program to do everything possible to reduce the likelihood of an ileus. If you do develop an ileus, it usually only lasts two to three days. However, it may require a small tube down the nose to decompress the stomach. The best way to avoid an ileus is to reduce the amount of narcotic pain medications, get up as much as possible after your surgery, and stimulate the bowel early after surgery with small amounts of food and liquids. ? Wound infection: If a wound infection develops, this usually happens three to ten days after surgery. ? ? ? Urinary retention: This is if you are unable to urinate after the catheter from your bladder is removed. The catheter may need to be reinserted until you are able to urinate on your own. This can be caused by anesthesia, pain medication and decreased activity. ? ? ? When you are preparing to go home, you will receive: ? Detailed discharge instructions, with information about your operation and medications ? ? ? All prescriptions for  medications you need at home; prescriptions can be filled while you are in the hospital if you would like ? ? ? You may be prescribed Lovenox. Lovenox is used to reduce the risk of developing a blood clot after surgery.? ? An appointment to see your surgeon or provider one to two weeks after you leave the hospital for follow-up ?  After Discharge Once you are discharged: Call us at any time if you are worried about your recovery or if you should have any questions. During regular office hours, (8:30 a.m.-4:30 p.m.), and after hours call [phone].  Call us immediately if: ? You have a fever higher than 100.4 degrees. ? ? ? Your wound is red, more painful or has drainage. ? ? ? You are nauseated, vomiting or can't keep liquids down. ? ? ? Your pain is worse and not able to be controlled with the regimen you were sent home with. ? ? ? If you are bleeding heavily or have a lot of fluid coming from your vagina.? ? If you are on narcotics, the goal is to wean you off of them. If you are running low on supply and need more, call the nurse a few days before you will run out. ? It is generally easier to reach someone between 8:30 a.m. - 4:30 p.m., so call early if you think something is not right. A nurse or nurse practitioner is available every day to answer your questions. After hours and on the weekends, the calls go to the resident doctors in the hospital. It may take longer for your phone call to be returned during this time. If you have a true emergency, such as severe abdominal pain, chest pain, shortness of breath or any other acute issues, call 911 and go to the local emergency room. Have them contact our team once you are stable.  Concerns After Discharge Bowel Function Following Your Surgery Your bowels will take several weeks to settle down and may be unpredictable at first. Your bowel movements may become loose, or you may be constipated. For the vast number of  patients, this will get back to normal with time. Make sure you eat nutritious meals, drink plenty of fluids and take regular walks during the first two weeks after your operation. Your Guide to Gynecologic Surgery   Abdominal Pain It is not unusual to suffer gripping pains (colic) during the first week following removal of a portion of your bowel. This pain usually  lasts for a few minutes but goes away between spasms. If you have severe pain lasting more than one to two hours or have a fever and feel generally unwell, you should contact us at the telephone contact numbers listed at the end of this packet. Hysterectomy: You should have pelvic rest for six (6) weeks or as specified by your doctor after surgery. You should have nothing in the vagina (no tampons, douching, intercourse, etc.,) during this time period. If you have some vaginal spotting, this is normal. If you have heavy bleeding or a lot of fluid from your vagina, this is NOT normal and you should contact your doctor's office or, if after hours, contact the doctor on call.  Diarrhea: Fiber and Imodium (Loperamide) The first step to improving your frequent or loose stools is to bulk up the stool with fiber. Metamucil is the most common type of fiber that is available at any drug store. Start with 1 teaspoon mixed into food, like yogurt or oatmeal, in the morning and evening. Try not to drink any fluid for one hour after you take the fiber. This will allow the fiber to act like a sponge in your intestines, soaking up all the excess water. Continue this for three to five days. You may increase by 1 teaspoon every three to five days until the desired affect, or you are at 1 tablespoon (3 teaspoons) twice a day. If this doesn't work, you may try over-the-counter Loperamide, which is an antidiarrheal medication. You may take one tablet in the morning and evening or 30 minutes before you typically have diarrhea. You may take up to  eight of these tablets daily. It is best to discuss this with [name] prior to using this medication. If you have continuous diarrhea and abdominal cramping, call [phone].  Foley Catheter Your surgeon may recommend you be discharged home with a foley catheter (bladder catheter) for 1 to 2 weeks. Typically this recommendation will be made for patients undergoing surgery to the lower urinary tract. Before you leave the hospital, your nurse should outfit you with a clip on the inner thigh to secure the catheter to prevent pulling as well as a small bag that can be easily worn on the upper leg under loose fitting pants and skirts. Your nurse will teach you how to exchange the large bag that typically comes with the catheter for the small bag. You may find it convenient to attach the small bag when active during the day and then the large bag when sleeping at night. If there is ever a point when you notice the catheter is not draining urine and youbegin to develop pain behind/above the pubic bone, you should report to the clinic or emergency room immediately as the catheter may be kinked or clogged. Kinking or clogging of the catheter prevents urine from draining from your bladder. Urine will quickly build up in the bladder and can cause severe pain as well as seriously disrupt healing if you have undergone surgery on the lower urinary tract. Additionally, pulling on the catheter can result in displacement of the balloon at the end of the catheter from inside of to outside of the bladder. This also results in severe pain and can cause bleeding. For this reason, secure the catheter to the clip on your inner thigh at all times as the clip prevents against pulling.  Wound Care For the first few weeks following surgery, your wound may be slightly red and uncomfortable. You may shower  and let the soapy water wash over your incision. Avoid soaking in the tub for one month following surgery or until the  wound is well healed. It will take the wound several months to "soften." It is common to have bumpy areas in the wound near the belly button and at the ends of the incision.  If you have staples, these should be removed when you are seen by your surgeon at the follow-up appointment. You may have a glue-like material on your incision. Do not pick at this. It will come off over time. It is the surgical glue used in surgery to close your incision. You also have sutures inside of you that will dissolve over time  Post-Surgery Diet Attention to good nutrition after surgery is important to your recovery. If you had no dietary restrictions prior to the surgery, you will have no special dietary restrictions after the surgery. However, consuming enough protein, calories, vitamins and minerals is necessary to support healing. Some patients find their appetite is less than normal after surgery. In this case, frequent small meals throughout the day may help. It is not uncommon to lose 10 to 15 pounds after surgery. However, by the fourth to fifth week, your weight loss should stabilize. It is normal that certain foods taste different and certain smells may make you nauseas. Over time, the amount you can comfortably consume will gradually increase. You should try to eat a balanced diet, which includes: ? Foods that are soft, moist, and easy to chew and swallow ? ? ? Canned or soft-cooked fruits and vegetables ? ? ? Plenty of soft breads, rice, pasta, potatoes and other starchy foods (lowerfiber varieties may be tolerated better initially) ? ? ? High-protein foods and beverages, such as meats, eggs, milk, cottage cheese ? or a supplemental nutrition drink like Boost or Ensure ? ? ? Drink plenty of fluids-at least 8 to 10 cups per day. This includes water, ? fruit juice, Gatorade, teas/coffee and milk. Drinking plenty is especially important if you have loose stools (diarrhea). ? ? Avoid  drinking a lot of caffeine, since this may dehydrate you. ? ? ? Avoid fried, greasy and highly seasoned or spicy foods. ? ? ? Avoid carbonated beverages in the first couple weeks. ? ? ? Avoid raw fruits and vegetables. ?  Hobbies/Activities Walking is encouraged after your surgery. You should plan to undertake regular exercise several times a day and gradually increase this during the four weeks following your operation until you are back to your normal level of activity. You may climb stairs. Don't do any heavy lifting greater than 10 pounds or contact sports for the first month after your surgery. Generally, you can return to hobbies and activities soon after your surgery. This will help you recover. It can take up to two to three months to fully recover. It is not unusual to be fatigued and require an afternoon nap for up to six to eight weeks following surgery. Your body is using this energy to heal your wounds. Set small goals for yourself and try to do a little more each day.  Work It is normal to return to work three to six weeks following your operation. If your job involves heavy manual work, then you should wait six weeks. However, you should check with your employer regarding rules, which may be relevant to your return to work. If you need a return-to-work form for your employer or disability papers, bring them to your follow-up appointment or  fax them to our office at [phone].  Driving You may drive when you are off narcotics and pain-free enough to react quickly with your braking foot. For most patients, this occurs at one to four weeks following surgery.   Write down any questions you may have to ask your care team.  Important Contact Numbers: GYN Oncology Office: [phone]     Discharge wound care:    Complete by:  As directed   Keep clean and dry     Driving Restrictions    Complete by:  As directed   No driving for 1- 2 weeks     Increase  activity slowly    Complete by:  As directed      Lifting restrictions    Complete by:  As directed   No lifting > 5 lbs for 6 weeks     May shower / Bathe    Complete by:  As directed   No tub baths for 6 weeks     May walk up steps    Complete by:  As directed      Sexual Activity Restrictions    Complete by:  As directed   No intercourse for 6 - 8 weeks            Medication List    TAKE these medications        acetaminophen 500 MG tablet  Commonly known as:  TYLENOL  Take 500 mg by mouth every 6 (six) hours as needed (Pain).     clidinium-chlordiazePOXIDE 5-2.5 MG capsule  Commonly known as:  LIBRAX  Take 1 capsule by mouth at bedtime.     enoxaparin 40 MG/0.4ML injection  Commonly known as:  LOVENOX  Inject 0.4 mLs (40 mg total) into the skin daily.     estradiol 0.05 MG/24HR patch  Commonly known as:  VIVELLE-DOT  Place 1 patch (0.05 mg total) onto the skin 2 (two) times a week.     fluorouracil 5 % cream  Commonly known as:  EFUDEX  APPLY TO NOSE NIGHTLY FOR 4 WEEKS     LORazepam 0.5 MG tablet  Commonly known as:  ATIVAN  Take 0.5 mg by mouth daily as needed for anxiety.     Oxycodone HCl 10 MG Tabs  Take 1 tablet (10 mg total) by mouth every 4 (four) hours as needed for breakthrough pain.           Follow-up Information    Schedule an appointment as soon as possible for a visit with [name], MD.   Specialty:  Obstetrics and Gynecology   Why:  postop f/u   Contact information:   [address] [place] [address] [phone]       Signed: Agnes Lawrence 03/29/2015, 11:27 AM

## 2015-03-31 ENCOUNTER — Telehealth: Payer: Self-pay

## 2015-03-31 NOTE — Telephone Encounter (Signed)
Patient called to update Dr [name] that she has been "nauseated" , writer inquiring when her LBM was and if she is taking her narcotics on an empty stomach. Patient states her LBM was Friday 02/10/217 and she also states she is taking her narcotic on an empty stomach, patient states her appetite is "ok". Patient instructed to take Miralax 2 times daily until she has a bowel movement and than she can switch to colace 2 times daily . Patient also instructed to take her narcotic with food , patient states understanding , patient instructed to call if she does not have a BM by tomorrow afternoon . Patient agreed with plan , denies further questions at this time ,will call with additional changes or concerns , Joylene John, APNP aware .

## 2015-04-01 ENCOUNTER — Ambulatory Visit (HOSPITAL_COMMUNITY)
Admission: RE | Admit: 2015-04-01 | Discharge: 2015-04-01 | Disposition: A | Discharge status: Home / Self Care | Payer: BLUE CROSS/BLUE SHIELD | Source: Ambulatory Visit | Attending: Gynecologic Oncology | Admitting: Gynecologic Oncology

## 2015-04-01 ENCOUNTER — Encounter (HOSPITAL_COMMUNITY): Payer: Self-pay

## 2015-04-01 ENCOUNTER — Ambulatory Visit (HOSPITAL_BASED_OUTPATIENT_CLINIC_OR_DEPARTMENT_OTHER): Payer: BLUE CROSS/BLUE SHIELD | Admitting: Gynecologic Oncology

## 2015-04-01 ENCOUNTER — Telehealth: Payer: Self-pay

## 2015-04-01 ENCOUNTER — Encounter: Payer: Self-pay | Admitting: Gynecologic Oncology

## 2015-04-01 ENCOUNTER — Ambulatory Visit (HOSPITAL_BASED_OUTPATIENT_CLINIC_OR_DEPARTMENT_OTHER): Payer: BLUE CROSS/BLUE SHIELD

## 2015-04-01 VITALS — BP 116/66 | HR 63 | Temp 97.7°F

## 2015-04-01 DIAGNOSIS — R112 Nausea with vomiting, unspecified: Secondary | ICD-10-CM

## 2015-04-01 DIAGNOSIS — Z85828 Personal history of other malignant neoplasm of skin: Secondary | ICD-10-CM | POA: Diagnosis not present

## 2015-04-01 DIAGNOSIS — J90 Pleural effusion, not elsewhere classified: Secondary | ICD-10-CM | POA: Diagnosis not present

## 2015-04-01 DIAGNOSIS — Z9889 Other specified postprocedural states: Secondary | ICD-10-CM | POA: Insufficient documentation

## 2015-04-01 DIAGNOSIS — Z882 Allergy status to sulfonamides status: Secondary | ICD-10-CM | POA: Diagnosis not present

## 2015-04-01 DIAGNOSIS — Z90710 Acquired absence of both cervix and uterus: Secondary | ICD-10-CM | POA: Diagnosis not present

## 2015-04-01 DIAGNOSIS — C762 Malignant neoplasm of abdomen: Secondary | ICD-10-CM

## 2015-04-01 DIAGNOSIS — Z91030 Bee allergy status: Secondary | ICD-10-CM | POA: Insufficient documentation

## 2015-04-01 DIAGNOSIS — C569 Malignant neoplasm of unspecified ovary: Secondary | ICD-10-CM | POA: Diagnosis not present

## 2015-04-01 DIAGNOSIS — C5701 Malignant neoplasm of right fallopian tube: Secondary | ICD-10-CM | POA: Insufficient documentation

## 2015-04-01 DIAGNOSIS — M545 Low back pain: Secondary | ICD-10-CM | POA: Diagnosis not present

## 2015-04-01 LAB — BASIC METABOLIC PANEL
Anion Gap: 9 mEq/L (ref 3–11)
BUN: 7.5 mg/dL (ref 7.0–26.0)
CO2: 28 mEq/L (ref 22–29)
Calcium: 9.2 mg/dL (ref 8.4–10.4)
Chloride: 100 mEq/L (ref 98–109)
Creatinine: 0.7 mg/dL (ref 0.6–1.1)
EGFR: >90 mL/min/{1.73_m2} (ref >90)
Glucose: 104 mg/dl (ref 70–140)
Potassium: 3.9 mEq/L (ref 3.5–5.1)
Sodium: 137 mEq/L (ref 136–145)

## 2015-04-01 LAB — CBC WITH DIFFERENTIAL/PLATELET
BASO%: 0.6 % (ref 0.0–2.0)
Basophils Absolute: 0 10*3/uL (ref 0.0–0.1)
EOS%: 1.8 % (ref 0.0–7.0)
Eosinophils Absolute: 0.1 10*3/uL (ref 0.0–0.5)
HCT: 33.2 % — ABNORMAL LOW (ref 34.8–46.6)
HGB: 11.2 g/dL — ABNORMAL LOW (ref 11.6–15.9)
LYMPH%: 33 % (ref 14.0–49.7)
MCH: 30.8 pg (ref 25.1–34.0)
MCHC: 33.6 g/dL (ref 31.5–36.0)
MCV: 91.8 fL (ref 79.5–101.0)
MONO#: 0.4 10*3/uL (ref 0.1–0.9)
MONO%: 9.1 % (ref 0.0–14.0)
NEUT#: 2.4 10*3/uL (ref 1.5–6.5)
NEUT%: 55.5 % (ref 38.4–76.8)
Platelets: 274 10*3/uL (ref 145–400)
RBC: 3.62 10*6/uL — ABNORMAL LOW (ref 3.70–5.45)
RDW: 12.5 % (ref 11.2–14.5)
WBC: 4.3 10*3/uL (ref 3.9–10.3)
lymph#: 1.4 10*3/uL (ref 0.9–3.3)

## 2015-04-01 MED ORDER — IOHEXOL 300 MG/ML  SOLN
100.0000 mL | Freq: Once | INTRAMUSCULAR | Status: AC | PRN
  Administered 2015-04-01: 100 mL via INTRAVENOUS

## 2015-04-01 MED ORDER — LORAZEPAM 0.5 MG PO TABS: 0.5000 mg | ORAL_TABLET | Freq: Three times a day (TID) | ORAL | Status: DC | PRN

## 2015-04-01 NOTE — Progress Notes (Signed)
Follow Up Note: Gyn-Onc  Kathryn Ramsey 50 y.o. female  CC:  Chief Complaint  Patient presents with  . N/V post-op, abdominal bulge    follow up post-op    HPI:  Kathryn Ramsey is a 50 year old female initially seen in consultation at the request of Dr. 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 She had a workup by urology for gross hematuria with a cystoscopy in December 2016. A pelvic exam was suggested for extrinsic mass effect on the bladder on cystoscopy.  On 03/05/2015 at [facility] she underwent a CT scan of the abdomen and pelvis as ordered by Dr Baruch Gouty which revealed a 1.4 cm low attenuation lesion on the posterior right hepatic lobe suggestive of a hemangioma, status post hysterectomy, no ovarian masses, moderate ascites, omental caking seen in the lateral left abdomen and pelvis, peritoneal nodularity in the left lateral pelvic cul-de-sac, no gross extrinsic compression on the bladder was identified on imaging.  The patient was then seen by her gastroenterologist, Dr. Collene Mares, who performed a colonoscopy which was unremarkable.  Tumor markers were drawn on 03/06/2015 and these included a CA-125 that was elevated to 957, and a CEA that was normal at 1.  Her history includes 4 spontaneous vaginal deliveries. She has a remote history of endometriosis. She is limited history of oral contraceptive pill usage (possibly 2 months). She a history of primary infertility and was treated with Clomid for her first pregnancy.  Her prior endometriosis was identified and treated with 3 laparoscopies. Her only other abdominal surgery was a laparoscopic cholecystectomy, and an LAVH on March 15th 2010 with removal of a right paratubal cyst for a fibroid uterus and menorrhagia peformed Dr. Servando Salina.   CT of the chest was performed on 03/21/15 and resulted :IMPRESSION: Unremarkable CT exam of the thorax. Specifically, no features to suggest metastatic disease.  On 03/27/15, she underwent a 1.  Diagnostic laparoscopy. 2. Exploratory laparotomy with bilateral salpingo-oophorectomy, omentectomy radical tumor debulking for ovarian cancer by Dr. [name].  Her post-operative course was uneventful.  Final pathology revealed: Diagnosis 1. 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 SEE ONCOLOGY TABLE. 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 2. Peritoneum, biopsy, nodule - METASTATIC HIGH GRADE SEROUS CARCINOMA. 3. Ovary and fallopian tube, left - LEFT OVARY: METASTATIC HIGH GRADE SEROUS CARCINOMA. - LEFT FALLOPIAN TUBE: METASTATIC HIGH GRADE SEROUS CARCINOMA. 4. Omentum, resection for tumor - METASTATIC HIGH GRADE SEROUS CARCINOMA.    Interval History:  She presents today with her friend for evaluation of post-operative nausea and vomiting and new abdominal bulge the size of a hard boiled egg.  She reports her last bowel movement the day after surgery with a minimal amount of loose stool.  No other bowel movement or flatus reported since surgery.  Reporting moderate nausea since surgery.  She has been taking Miralax with no relief.  She reports the development of a "bulge the size of a hard boiled egg" last pm at the top of the incision.  The area is tender to the touch, firm, and painful intermittently.  She has been belching frequently and threw up last pm twice.  She called and spoke with the after hours RN and was given zofran to take three times a day.  She reports some relief with the zofran.  She has only taken in small amounts of food, including a cracker with almond butter today.  Decreased appetite.  Bladder  functioning without difficulty.  Lower back pain reported as well.  Reporting anxiety related to upcoming chemo and upcoming cardiac procedures with her father.  No other concerns voiced.        Review of Systems:  Constitutional: Feels a little better today since taking zofran.  Denies fever, chills, early  satiety.  Decreased appetite reported.  Cardiovascular: No chest pain, shortness of breath, or edema.  Pulmonary: No cough or wheeze.  Gastrointestinal: No nausea, vomiting, or diarrhea. No bright red blood per rectum or change in bowel movement.  Genitourinary: No frequency, urgency, or dysuria. No vaginal bleeding or discharge.  Musculoskeletal: Positive for lower back pain. Neurologic: No weakness, numbness, or change in gait.  Psychology: No depression.  Anxiety and insomnia related to upcoming chemo and issues with her father.   Current Meds:  Outpatient Encounter Prescriptions as of 04/01/2015  Medication Sig  . acetaminophen (TYLENOL) 500 MG tablet Take 500 mg by mouth every 6 (six) hours as needed (Pain).  . clidinium-chlordiazePOXIDE (LIBRAX) 5-2.5 MG capsule Take 1 capsule by mouth at bedtime.  . enoxaparin (LOVENOX) 40 MG/0.4ML injection Inject 0.4 mLs (40 mg total) into the skin daily.  [name] estradiol (VIVELLE-DOT) 0.05 MG/24HR patch Place 1 patch (0.05 mg total) onto the skin 2 (two) times a week.  . ondansetron (ZOFRAN) 4 MG tablet Take 4 mg by mouth 3 (three) times daily.  [name] oxyCODONE 10 MG TABS Take 1 tablet (10 mg total) by mouth every 4 (four) hours as needed for breakthrough pain.  [name] LORazepam (ATIVAN) 0.5 MG tablet Take 1 tablet (0.5 mg total) by mouth every 8 (eight) hours as needed for anxiety or sleep. Reported on 04/01/2015  . [DISCONTINUED] fluorouracil (EFUDEX) 5 % cream APPLY TO NOSE NIGHTLY FOR 4 WEEKS  . [DISCONTINUED] LORazepam (ATIVAN) 0.5 MG tablet Take 0.5 mg by mouth daily as needed for anxiety. Reported on 04/01/2015   No facility-administered encounter medications on file as of 04/01/2015.    Allergy:  Allergies  Allergen Reactions  . Bee Venom Shortness Of Breath    swelling  . Sulfa Antibiotics Shortness Of Breath    Vomit , diarrhea , hives    Social Hx:   Social History   Social History  . Marital Status: Married    Spouse Name: N/A  . Number  of Children: N/A  . Years of Education: N/A   Occupational History  . Not on file.   Social History Main Topics  . Smoking status: Never Smoker   . Smokeless tobacco: Never Used  . Alcohol Use: Yes     Comment: socially  . Drug Use: No  . Sexual Activity: Not on file   Other Topics Concern  . Not on file   Social History Narrative    Past Surgical Hx:  Past Surgical History  Procedure Laterality Date  . Cholecystectomy      2010  . Abdominal hysterectomy      2010  . Sinus surgery 1994    . 3 laporscopic proceedures    . Dilation and curettage of uterus      1997  . Laparoscopy N/A 03/27/2015    Procedure: DIAGNOSTIC LAPAROSCOPY ;  Surgeon: [name], MD;  Location: [facility];  Service: Gynecology;  Laterality: N/A;  . Laparotomy N/A 03/27/2015    Procedure: EXPLORATORY LAPAROTOMY, BILATERAL SALPINGO OOPHORECTOMY, OMENTECTOMY, RADICAL TUMOR DEBULKING;  Surgeon: [name], MD;  Location: [facility];  Service: Gynecology;  Laterality: N/A;    Past Medical  Hx:  Past Medical History  Diagnosis Date  . Skin cancer   . Allergy   . Blood transfusion without reported diagnosis     at age 71 years old D/T surgery to femur being crushed  . Complication of anesthesia     hx. 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 Asthma     illness induced asthma  . Anemia   . PONV (postoperative nausea and vomiting)   . Heart murmur     pt, states had a "working heart murmur"    Family Hx:  Family History  Problem Relation Age of Onset  . Hypertension Father   . Cancer Maternal Aunt   . Stroke Maternal Grandmother     Vitals:  Blood pressure 116/66, pulse 63, temperature 97.7 F (36.5 C), temperature source Oral.  Physical Exam:  General: Well developed, well nourished female in no acute distress. Alert and oriented x 3.  Cardiovascular: Regular rate and rhythm. S1 and S2 normal.  Lungs: Clear to auscultation bilaterally. No  wheezes/crackles/rhonchi noted.  Skin: No rashes or lesions present. Back: No CVA tenderness.  Abdomen: Abdomen tender with light palpation, more on the area to the right of the midline incision. Honeycomb dressing removed.  Staples intact with mild ecchymosis present.  2 cm firm area palpated to the right of the incision.  More prominent with the patient standing.  No increased protrusion with patient lightly straining or coughing.  Active bowel sounds in all quadrants. Non-distended.   Extremities: No bilateral cyanosis, edema, or clubbing.   Assessment/Plan: 50 year old s/p 1. Diagnostic laparoscopy. 2. Exploratory laparotomy with bilateral salpingo-oophorectomy, omentectomy radical tumor debulking for ovarian cancer by Dr. 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  Due to nausea, emesis, no bowel movement or flatus post-operatively, a CT scan of the abdomen and pelvis will be ordered to rule out obstruction or ileus and also to assess the abdominal mass noted at the upper aspect of the incision per Dr. [name].  A CBC with diff along with a Bmet will also be obtained.  Plan to follow up on the results of the lab work and CT scan.  Patient to come back to the office to discuss the results when finished with CT.  Dr. Denman George aware of the situation.     CROSS, MELISSA DEAL, NP 04/01/2015, 8:33 PM

## 2015-04-01 NOTE — Patient Instructions (Signed)
We will check labs today and notify you of the results.  Plan to have a CT scan of the abdomen/pelvis to rule out hernia vs ileus.

## 2015-04-01 NOTE — Progress Notes (Signed)
16:20pm Patient seen in the lobby after her CT scan.  CT findings discussed along with lab results.  Bowel regimen discussed including Miralax one capful twice daily along with Colace twice daily.  Patient is to call in the am with an update.

## 2015-04-01 NOTE — Telephone Encounter (Signed)
Follow up call placed in regards to the triage call placed after clinic hours 04/10/2015 for concerns of intermittent nausea , Zofran 4 mg po TID , PRN was prescribed . Patient states she is feeling better today , compared to yesterday , but did have 2 episodes of emesis last night. Patient states she still did not have a BM since the initiation of taking the Miralax yesterday 03/31/2015 . Patient also states she noticed she has an "egg" shape protrusion at the top of her incision ,that is firm and tender to the touch. Patient denies any edema ,redness or drainage to surgical incision site. Melissa Cross , APNP updated , orders received to have the patient come in today to have her surgical site assessed . Orders also received to instructed the patient to start taking Colace BID with Miralax two capfuls daily in addition to the Miralax . Patient agreed to plan and will be seen by the NP today at 1 PM , patient denies further questions or concerns at this time .

## 2015-04-03 ENCOUNTER — Other Ambulatory Visit: Payer: Self-pay

## 2015-04-03 DIAGNOSIS — L630 Alopecia (capitis) totalis: Secondary | ICD-10-CM

## 2015-04-03 NOTE — Telephone Encounter (Signed)
Pt called [phone] for wig rx to [facility]. Rx was faxed. Called pt back that this was done.

## 2015-04-06 ENCOUNTER — Other Ambulatory Visit: Payer: Self-pay | Admitting: Oncology

## 2015-04-07 ENCOUNTER — Other Ambulatory Visit (HOSPITAL_BASED_OUTPATIENT_CLINIC_OR_DEPARTMENT_OTHER): Payer: BLUE CROSS/BLUE SHIELD

## 2015-04-07 ENCOUNTER — Ambulatory Visit: Payer: BLUE CROSS/BLUE SHIELD | Attending: Gynecologic Oncology | Admitting: Gynecologic Oncology

## 2015-04-07 ENCOUNTER — Encounter: Payer: Self-pay | Admitting: Oncology

## 2015-04-07 ENCOUNTER — Ambulatory Visit (HOSPITAL_BASED_OUTPATIENT_CLINIC_OR_DEPARTMENT_OTHER): Payer: BLUE CROSS/BLUE SHIELD | Admitting: Oncology

## 2015-04-07 ENCOUNTER — Encounter: Payer: Self-pay | Admitting: Gynecologic Oncology

## 2015-04-07 VITALS — BP 119/59 | HR 80 | Temp 98.1°F | Resp 18 | Ht 63.0 in | Wt 114.8 lb

## 2015-04-07 VITALS — BP 108/57 | HR 79 | Temp 98.1°F | Resp 18 | Ht 63.0 in | Wt 115.6 lb

## 2015-04-07 DIAGNOSIS — D649 Anemia, unspecified: Secondary | ICD-10-CM | POA: Insufficient documentation

## 2015-04-07 DIAGNOSIS — J45909 Unspecified asthma, uncomplicated: Secondary | ICD-10-CM | POA: Diagnosis not present

## 2015-04-07 DIAGNOSIS — N390 Urinary tract infection, site not specified: Secondary | ICD-10-CM

## 2015-04-07 DIAGNOSIS — C762 Malignant neoplasm of abdomen: Secondary | ICD-10-CM

## 2015-04-07 DIAGNOSIS — C5701 Malignant neoplasm of right fallopian tube: Secondary | ICD-10-CM | POA: Diagnosis not present

## 2015-04-07 DIAGNOSIS — R3989 Other symptoms and signs involving the genitourinary system: Secondary | ICD-10-CM | POA: Diagnosis not present

## 2015-04-07 DIAGNOSIS — Z807 Family history of other malignant neoplasms of lymphoid, hematopoietic and related tissues: Secondary | ICD-10-CM | POA: Insufficient documentation

## 2015-04-07 DIAGNOSIS — Z85828 Personal history of other malignant neoplasm of skin: Secondary | ICD-10-CM | POA: Diagnosis not present

## 2015-04-07 DIAGNOSIS — Z9889 Other specified postprocedural states: Secondary | ICD-10-CM | POA: Insufficient documentation

## 2015-04-07 DIAGNOSIS — Z08 Encounter for follow-up examination after completed treatment for malignant neoplasm: Secondary | ICD-10-CM | POA: Insufficient documentation

## 2015-04-07 DIAGNOSIS — R011 Cardiac murmur, unspecified: Secondary | ICD-10-CM | POA: Insufficient documentation

## 2015-04-07 DIAGNOSIS — K589 Irritable bowel syndrome without diarrhea: Secondary | ICD-10-CM

## 2015-04-07 DIAGNOSIS — C482 Malignant neoplasm of peritoneum, unspecified: Secondary | ICD-10-CM

## 2015-04-07 LAB — CBC WITH DIFFERENTIAL/PLATELET
BASO%: 0.5 % (ref 0.0–2.0)
Basophils Absolute: 0 10*3/uL (ref 0.0–0.1)
EOS%: 2.3 % (ref 0.0–7.0)
Eosinophils Absolute: 0.1 10*3/uL (ref 0.0–0.5)
HCT: 35.3 % (ref 34.8–46.6)
HGB: 11.9 g/dL (ref 11.6–15.9)
LYMPH%: 27.9 % (ref 14.0–49.7)
MCH: 31 pg (ref 25.1–34.0)
MCHC: 33.6 g/dL (ref 31.5–36.0)
MCV: 92.4 fL (ref 79.5–101.0)
MONO#: 0.5 10*3/uL (ref 0.1–0.9)
MONO%: 8.6 % (ref 0.0–14.0)
NEUT#: 3.3 10*3/uL (ref 1.5–6.5)
NEUT%: 60.7 % (ref 38.4–76.8)
Platelets: 395 10*3/uL (ref 145–400)
RBC: 3.82 10*6/uL (ref 3.70–5.45)
RDW: 13 % (ref 11.2–14.5)
WBC: 5.4 10*3/uL (ref 3.9–10.3)
lymph#: 1.5 10*3/uL (ref 0.9–3.3)

## 2015-04-07 LAB — COMPREHENSIVE METABOLIC PANEL
ALT: 55 U/L (ref 0–55)
AST: 22 U/L (ref 5–34)
Albumin: 4 g/dL (ref 3.5–5.0)
Alkaline Phosphatase: 60 U/L (ref 40–150)
Anion Gap: 9 mEq/L (ref 3–11)
BUN: 8.5 mg/dL (ref 7.0–26.0)
CO2: 28 mEq/L (ref 22–29)
Calcium: 9.4 mg/dL (ref 8.4–10.4)
Chloride: 103 mEq/L (ref 98–109)
Creatinine: 0.7 mg/dL (ref 0.6–1.1)
EGFR: >90 mL/min/{1.73_m2} (ref >90)
Glucose: 101 mg/dl (ref 70–140)
Potassium: 3.9 mEq/L (ref 3.5–5.1)
Sodium: 140 mEq/L (ref 136–145)
Total Bilirubin: 0.37 mg/dL (ref 0.20–1.20)
Total Protein: 7.8 g/dL (ref 6.4–8.3)

## 2015-04-07 NOTE — Progress Notes (Signed)
FOLLOW UP VISIT: FALLOPIAN TUBE CANCER  Assessment:    50 y.o. year old with stage IIIC right fallopian tube cancer.   S/p optimal cytoreductive surgery (BSO, omentectomy, ablation of tumor implants) on 03/27/15.   Plan: 1) Pathology reports reviewed today 2) Treatment counseling - I spent approximately 30 minutes addressing surgical and pathology findings, staging and prognosis and treatment plan. We discussed recommendations from NCCN guidelines is 6 cycles of adjuvant platinum and taxane chemotherapy. We will obtain tumor markers throughout treatment and post treatment imaging for restaging.  We discussed the possibility of alternative treatments and clinical trials. I discussed that we have a clinical trial on hold at [facility] which she would be eligible for, but this would involve delaying therapy.  She was given the opportunity to ask questions, which were answered to her satisfaction, and she is agreement with the above mentioned plan of care.  3)  Return to clinic after completing chemotherapy.  4) referral to genetic counselor for BRCA germline testing.  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 The patient has a history of a workup by urology for gross hematuria which included a pelvic examine was suggested for extrinsic mass effect on the bladder on cystoscopy. Cystoscopy took place on in December 2016. The patient denies abdominal pain bloating early satiety or abdominal distention.  On 08/03/2015 at [facility] she underwent a CT scan of the abdomen and pelvis as ordered by Dr Baruch Gouty. This revealed a 1.4 cm low attenuation lesion on the posterior right hepatic lobe suggestive of a hemangioma. Status post hysterectomy. No ovarian masses. Moderate ascites. Omental caking seen in the lateral left abdomen and pelvis. Peritoneal nodularity in the left  lateral pelvic cul-de-sac. No gross extrinsic compression on the bladder was identified on imaging.  The patient was then seen by her gastroenterologist, Dr. Collene Mares, who performed a colonoscopy which was unremarkable.  Tumor markers were drawn on 08/04/2015 and these included a CA-125 that was elevated to 957, and a CEA that was normal at 1.  The patient is otherwise a very healthy woman. She is an Futures trader. She has had a history of 4 spontaneous vaginal deliveries. She has a remote history of endometriosis. She is limited history of oral contraceptive pill usage (possibly 2 months). She a history of primary infertility and was treated with Clomid for her first pregnancy.  Her prior endometriosis was identified and treated with 3 laparoscopies. Her only other abdominal surgery was a laparoscopic cholecystectomy, and an LAVH on March 15th 2010 with removal of a right paratubal cyst for a fibroid uterus and menorrhagia. This was performed by Dr. Servando Salina.   She has no remarkable history for malignancy. Her maternal aunt had non-hodgkins lymphoma.  Interval Hx: On 03/27/15 she underwent diagnostic laparoscopy, exploratory laparotomy, BSO, omentectomy, argon beam ablation of tumor implants for an optimal cytoreduction (R0) for high grade serous fallopian tube cancer. Final pathology confirmed that her primary tumor was the right fallopian tube.  She had an uncomplicated postoperative stay in hospital and was discharged on POD 2.  She was evaluated in the office on POD 7 for increased abdominal discomfort and feeling a protrusion in her upper abdomen.CT scan confirmed no hernia.  She has been having intermittent diarrhea and nausea but is doing well today. Overall she is improving each day.  Past Medical  History  Diagnosis Date  . Skin cancer   . Allergy   . Blood transfusion without reported diagnosis     at age 1 years old D/T surgery to femur being crushed  . 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 Asthma     illness induced asthma  . Anemia   . PONV (postoperative nausea and vomiting)   . Heart murmur     pt, states had a "working heart murmur"   Past Surgical History  Procedure Laterality Date  . Cholecystectomy      2010  . Abdominal hysterectomy      2010  . Sinus surgery 1994    . 3 laporscopic proceedures    . Dilation and curettage of uterus      1997  . Laparoscopy N/A 03/27/2015    Procedure: DIAGNOSTIC LAPAROSCOPY ;  Surgeon: [name], MD;  Location: [facility];  Service: Gynecology;  Laterality: N/A;  . Laparotomy N/A 03/27/2015    Procedure: EXPLORATORY LAPAROTOMY, BILATERAL SALPINGO OOPHORECTOMY, OMENTECTOMY, RADICAL TUMOR DEBULKING;  Surgeon: [name], MD;  Location: [facility];  Service: Gynecology;  Laterality: N/A;   Family History  Problem Relation Age of Onset  . Hypertension Father   . Cancer Maternal Aunt   . Stroke Maternal Grandmother    Social History   Social History  . Marital Status: Married    Spouse Name: N/A  . Number of Children: N/A  . Years of Education: N/A   Occupational History  . Not on file.   Social History Main Topics  . Smoking status: Never Smoker   . Smokeless tobacco: Never Used  . Alcohol Use: Yes     Comment: socially  . Drug Use: No  . Sexual Activity: Not on file   Other Topics Concern  . Not on file   Social History Narrative   Allergies  Allergen Reactions  . Bee Venom Shortness Of Breath    swelling  . Sulfa Antibiotics Shortness Of Breath    Vomit , diarrhea , hives   Current Outpatient Prescriptions on File Prior to Visit  Medication Sig Dispense Refill  . acetaminophen (TYLENOL) 500 MG tablet Take 500 mg by mouth every 6 (six) hours as needed (Pain).    . clidinium-chlordiazePOXIDE (LIBRAX) 5-2.5 MG capsule Take 1 capsule by mouth at bedtime.  0  . enoxaparin (LOVENOX) 40 MG/0.4ML injection Inject 0.4  mLs (40 mg total) into the skin daily. 26 Syringe 0  . estradiol (VIVELLE-DOT) 0.05 MG/24HR patch Place 1 patch (0.05 mg total) onto the skin 2 (two) times a week. 8 patch 12  . LORazepam (ATIVAN) 0.5 MG tablet Take 1 tablet (0.5 mg total) by mouth every 8 (eight) hours as needed for anxiety or sleep. Reported on 04/01/2015 30 tablet 0  . ondansetron (ZOFRAN) 4 MG tablet Take 4 mg by mouth 3 (three) times daily.  0  . ibuprofen (ADVIL,MOTRIN) 200 MG tablet Take 200 mg by mouth every 8 (eight) hours as needed.    [name] oxyCODONE 10 MG TABS Take 1 tablet (10 mg total) by mouth every 4 (four) hours as needed for breakthrough pain. (Patient not taking: Reported on 04/07/2015) 40 tablet 0   No current facility-administered medications on file prior to visit.    Review of systems: Constitutional:  She has no weight gain or weight loss. She has no fever or chills.  Eyes: No blurred vision Ears, Nose, Mouth, Throat: No dizziness, headaches or changes in hearing. No mouth sores. Cardiovascular: No chest pain, palpitations or edema. Respiratory:  No shortness of breath, wheezing or cough Gastrointestinal: She has normal bowel movements without diarrhea or constipation. She denies any nausea or vomiting. She denies blood in her stool or heart burn. Genitourinary:  She denies pelvic pain, pelvic pressure or changes in her urinary function. She has no hematuria, dysuria, or incontinence. She has no irregular vaginal bleeding or vaginal discharge Musculoskeletal: Denies muscle weakness or joint pains.  Skin:  She has no skin changes, rashes or itching Neurological:  Denies dizziness or headaches. No neuropathy, no numbness or tingling. Psychiatric:  She denies depression or anxiety. Hematologic/Lymphatic:   No easy bruising or bleeding   Physical Exam: Blood pressure 119/59, pulse 80, temperature 98.1 F (36.7 C), temperature source Oral, resp. rate 18, height '5\' 3"'  (1.6 m), weight 114 lb 12.8 oz (52.073 kg),  SpO2 100 %. General: Well dressed, well nourished in no apparent distress.   HEENT:  Normocephalic and atraumatic, no lesions.  Extraocular muscles intact. Sclerae anicteric. Pupils equal, round, reactive. No mouth sores or ulcers. Thyroid is normal size, not nodular, midline. Skin:  No lesions or rashes. Lungs:  Clear to auscultation bilaterally.  No wheezes. Cardiovascular:  Regular rate and rhythm.  No murmurs or rubs. Abdomen:  Soft, nontender, nondistended.  No palpable masses.  No hepatosplenomegaly.  No ascites. Normal bowel sounds.  No hernias.  Incision is well healed. Protrusion in upper abdomen consistent with fascial gathering from reapproximation of tissue. Genitourinary: deferred Extremities: No cyanosis, clubbing or edema.  No calf tenderness or erythema. No palpable cords. Psychiatric: Mood and affect are appropriate. Neurological: Awake, alert and oriented x 3. Sensation is intact, no neuropathy.  Musculoskeletal: No pain, normal strength and range of motion.   30 minutes of direct face to face counseling time was spent with the patient. This included discussion about prognosis, therapy recommendations and postoperative side effects and are beyond the scope of routine postoperative care. [name], MD

## 2015-04-07 NOTE — Progress Notes (Signed)
OFFICE PROGRESS NOTE   April 07, 2015   Physicians: [name], Harlan Stains, MD, [facility], Jyothi Collene Mares, Baruch Gouty (urology)   Discussed with gyn oncology during visit today.  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 Plan is to proceed with adjuvant chemotherapy; as [facility] trial that was being considered for her is not available now, she would like treatment off study in [place]. She saw Dr Denman George also today, doing well post operatively.  Patient was hospitalized 2-9 thru 03-29-15, with surgery 03-27-15 exploratory laparotomy with BSO, omentectomy and radical debulking, R0 by completion of procedure. Surgical findings included 500cc ascites; diffuse studding of small and large bowel, peritoneum in pelvis and right diaphragm with 2 mm tumor implants; 6 cm omental cake. Pathology [phone]) high grade serous carcinoma involving bilateral tubes and ovaries, omentum and peritoneal nodule, + LVSI. [name] She had somewhat slow recovery of bowel function, with repeat CT AP done 04-01-15 without evidence of obstruction, but overall has been progressively recovering since DC home.    No nausea after surgery, used scop patch. Used pain medication only initially, now ok with ibuprofen or tylenol usually for abdominal "tightness" by end of day. She was constipated just after surgery, then 2-18 large diarrhea stool, and no BM since; she has IBS x years, is using colace now. No fever. Appetite better in last few days. Using ativan at hs since surgery. Some vague discomfort with bladder, will check urine. No vaginal bleeding, no other bleeding. Husband giving lovenox injections, planned x 4 weeks. No problems with peripheral IV access in hospital. Remainder of 10 point Review of Systems negative.    ONCOLOGIC HISTORY Patient has been generally healthy, post  vaginal hysterectomy for fibroids and menorrhagia 2012. In ~ 11-2014 she noticed bladder pressure and occasional trace blood ? Vaginal vs urinary. She was seen by Dr Pilar Jarvis, with cystoscopy 01-31-15 suggesting extrinsic compression of bladder. She had CT AP at [facility] 03-04-14, with moderate ascites, omental caking in lateral left abdomen and pelvis, absent uterus, "ovaries are likely visualized" , no adenopathy, no hydronephrosis or renal stones, lung bases clear, hemangioma posterior right hepatic lobe (CT report to be scanned into this EMR). CA 125 by Dr Collene Mares 03-06-15 was 957, with CEA 1.0. She had colonoscopy by Dr Collene Mares on 03-12-15, reportedly unremarkable. She had consultation with Dr Denman George on 03-17-15, with plan for initial surgery if amenable to optimal debulking, otherwise to begin with neoadjuvant chemotherapy. CT chest 03-21-15 no findings of concern. She had exploratory laparotomy with BSO, omentectomy and radical R0 debulking on 03-27-15, pathology ([id number]) high grade serous carcinoma involving bilateral tubes and ovaries, omentum and peritoneal nodule.   Objective:  Vital signs in last 24 hours:  BP 108/57 mmHg  Pulse 79  Temp(Src) 98.1 F (36.7 C) (Oral)  Resp 18  Ht 5' 3" (1.6 m)  Wt 115 lb 9.6 oz (52.436 kg)  BMI 20.48 kg/m2  SpO2 99% Weight down 2 lbs. Alert, oriented and appropriate. Ambulatory without assistance, able to get on and off exam table and change position well.    HEENT:PERRL, sclerae not icteric. Oral mucosa moist without lesions, posterior pharynx clear.  Neck supple. No JVD.  Lymphatics:no cervical,supraclavicular adenopathy Resp: clear to auscultation bilaterally and normal percussion bilaterally Cardio: regular rate and rhythm. No gallop. GI: soft, nontender, not distended, no mass, a few bowel sounds. Surgical  incision clean and dry, surgical glue intact, minimal tenderness, no heat or erythema Musculoskeletal/ Extremities: without pitting edema,  cords, tenderness Neuro: no peripheral neuropathy. Otherwise nonfocal. PSYCH appropriate mood and affect Skin without rash, petechiae. No significant ecchymoses at sites of lovenox injections   Lab Results:  Results for orders placed or performed in visit on 04/07/15  CBC with Differential  Result Value Ref Range   WBC 5.4 3.9 - 10.3 10e3/uL   NEUT# 3.3 1.5 - 6.5 10e3/uL   HGB 11.9 11.6 - 15.9 g/dL   HCT 35.3 34.8 - 46.6 %   Platelets 395 145 - 400 10e3/uL   MCV 92.4 79.5 - 101.0 fL   MCH 31.0 25.1 - 34.0 pg   MCHC 33.6 31.5 - 36.0 g/dL   RBC 3.82 3.70 - 5.45 10e6/uL   RDW 13.0 11.2 - 14.5 %   lymph# 1.5 0.9 - 3.3 10e3/uL   MONO# 0.5 0.1 - 0.9 10e3/uL   Eosinophils Absolute 0.1 0.0 - 0.5 10e3/uL   Basophils Absolute 0.0 0.0 - 0.1 10e3/uL   NEUT% 60.7 38.4 - 76.8 %   LYMPH% 27.9 14.0 - 49.7 %   MONO% 8.6 0.0 - 14.0 %   EOS% 2.3 0.0 - 7.0 %   BASO% 0.5 0.0 - 2.0 %  Comprehensive metabolic panel  Result Value Ref Range   Sodium 140 136 - 145 mEq/L   Potassium 3.9 3.5 - 5.1 mEq/L   Chloride 103 98 - 109 mEq/L   CO2 28 22 - 29 mEq/L   Glucose 101 70 - 140 mg/dl   BUN 8.5 7.0 - 26.0 mg/dL   Creatinine 0.7 0.6 - 1.1 mg/dL   Total Bilirubin 0.37 0.20 - 1.20 mg/dL   Alkaline Phosphatase 60 40 - 150 U/L   AST 22 5 - 34 U/L   ALT 55 0 - 55 U/L   Total Protein 7.8 6.4 - 8.3 g/dL   Albumin 4.0 3.5 - 5.0 g/dL   Calcium 9.4 8.4 - 10.4 mg/dL   Anion Gap 9 3 - 11 mEq/L   EGFR >90 >90 ml/min/1.73 m2     Studies/Results:  PRESCIOUS, HURLESS ([id number]) Patient: LYNZI, MEULEMANS Collected: 03/27/2015 Client: [facility] Accession: [id number] Received: 03/27/2015 [name], MD NOSIS Diagnosis 1. 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 SEE ONCOLOGY TABLE. 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 2. Peritoneum, biopsy, nodule - METASTATIC HIGH GRADE SEROUS CARCINOMA. 3. Ovary and fallopian tube, left - LEFT  OVARY: METASTATIC HIGH GRADE SEROUS CARCINOMA. - LEFT FALLOPIAN TUBE: METASTATIC HIGH GRADE SEROUS CARCINOMA. 4. Omentum, resection for tumor - METASTATIC HIGH GRADE SEROUS CARCINOMA.   EXAM: CT ABDOMEN AND PELVIS WITH CONTRAST  TECHNIQUE: Multidetector CT imaging of the abdomen and pelvis was performed using the standard protocol following bolus administration of intravenous contrast.  CONTRAST: 144m OMNIPAQUE IOHEXOL 300 MG/ML SOLN  COMPARISON: CT 03/05/2015  FINDINGS: Lower chest: Small LEFT pleural effusion and atelectasis. No pneumonitis or pneumonia.  Hepatobiliary: No focal hepatic lesion. Postcholecystectomy. No biliary dilatation.  Pancreas: Pancreas is normal. No ductal dilatation. No pancreatic inflammation.  Spleen: Normal spleen  Adrenals/urinary tract: Adrenal glands and kidneys are normal. The ureters and bladder normal. Delayed imaging demonstrates normal ureters proximally without evidence of hydronephrosis. Delayed imaging through the bladder demonstrates no urine leak. Bladder is intact. There is gas in the bladder presumably related to recent Foley catheterization.  Stomach/Bowel: Stomach and small bowel appear normal. There  is no evidence of bowel dilatation to suggest obstruction or significant ileus. There is stool in the ascending, transverse descending and rectosigmoid colon without evidence distention or obstruction. No pneumatosis or free air.  Vascular/Lymphatic: Abdominal aorta is normal caliber. There is no retroperitoneal or periportal lymphadenopathy. No pelvic lymphadenopathy.  Reproductive: Post hysterectomy anatomy. Free fluid in the pelvis is decreased compared to prior.  Other: No pelvic nodularity and omental nodularity identified on comparison exam has been resected. Note we year remaining peritoneal or omental nodularity persists. Small amount free fluid in the RIGHT pelvis.  There is gas along the RIGHT  pelvic sidewall (image 70, series 2 consistent with recent surgery.  Musculoskeletal: No aggressive osseous lesion.  IMPRESSION: 1. No evidence of bowel obstruction or a significant ileus. Stool and gas throughout the colon to the level of the rectum. 2. Small amount of intraperitoneal free air / retroperitoneal free air along the RIGHT pelvic sidewall related to recent surgery. 3. Interval resection of omental nodularity. 4. No evidence of ureteral or bladder injury. Gas within bladder presumably related to recent catheterization. 5. Mild LEFT basilar atelectasis and small effusion.    CT CHEST WITH CONTRAST  TECHNIQUE: Multidetector CT imaging of the chest was performed during intravenous contrast administration.  CONTRAST: 68m OMNIPAQUE IOHEXOL 300 MG/ML SOLN  COMPARISON: None.  FINDINGS: Mediastinum / Lymph Nodes: There is no axillary lymphadenopathy. No mediastinal lymphadenopathy. There is no hilar lymphadenopathy. Calcified lymph node identified in the upper right hilum. The heart size is normal. No pericardial effusion. The esophagus has normal imaging features.  Lungs / Pleura: No focal airspace consolidation. No pulmonary edema or pleural effusion. No suspicious pulmonary nodule or mass.  Upper Abdomen: Unremarkable.  MSK / Soft Tissues: Bone windows reveal no worrisome lytic or sclerotic osseous lesions.  IMPRESSION: Unremarkable CT exam of the thorax. Specifically, no features to suggest metastatic disease.     Medications: I have reviewed the patient's current medications. OK to use usual probiotic unless WBC low.  Will use Aloxi with treatment   Prescriptions as follows:  zofran 8 mg every 8 hrs prn nausea, ok to begin 72 hrs after chemo #30 1 RF (she has 4 mg tabs at home now)   Compazine 10 mg every 6 hrs prn nausea #20 1 RF   Decadron 4 mg Five tabs with food 12 hrs and 6 hrs prior to chemo #10 for first cycle  Claritin  10 mg daily begin day of chemo    DISCUSSION   All of interval history reviewed; we are all pleased that surgery was able to be accomplished so well. Original schedule for first chemo on 04-09-15 was done in case she needed neoadjuvant chemo; as that is not the case, we will delay start of treatment until 04-14-15 (post op day 18) to allow her to improve a little more from surgery from standpoint of GI tract and nutrition. She prefers treatments on Mon or Tues.  She is very anxious about possible nausea. Will use aloxi with chemo per newest NCCN guidelines, and other antiemetics as above Will add IVF ~ day 3 for the first cycle at least. Discussed meds as above Discussed genetics counseling, that referral made. [place] I have told her that probably best for her not to try that.   Patient is relieved that she will have a few more days prior to start of chemo. 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 Will begin adjuvant carboplatin taxol on 04-14-15. Will try to keep antiemetics optimized and will give IVF on ~ day 3.  I will see her back ~ 1 wk and 2 wks after chemo, with labs. Will add gCSF if needed. Referral for genetics counseling 2.post hysterectomy for benign indications 2012. History of endometriosis. 3.irritable bowel syndrome, known to Dr Collene Mares 4.flu vaccine 03-25-15 5.heterogeneously dense breast tissue by prior mammogram reports. Recommended 3D imaging for mammograms planned at [facility] 03-24-15 6. Post cholecystecytomy 7.surgery for crushed left femur age 11 8.mild bladder discomfort: will check urine specimen 2-21 (lab closed by completion of visit today)   Chemo orders confirmed and IVF orders entered. Granix has been approved if needed. All questions answered, RN will also follow up by phone to review antiemetics  and time of decadron administration prior to first treatment. Patient and husband know that they can call if any questions or concerns prior to next scheduled visit. Time spent 45 min including >50% counseling and coordination of care. Cc Drs Linward Foster  Gordy Levan, MD   04/07/2015, 5:06 PM

## 2015-04-07 NOTE — Patient Instructions (Signed)
Plan to follow up with Dr. [name] as scheduled.  You will also receive a phone call from the [facility] about a referral to genetics.

## 2015-04-08 ENCOUNTER — Encounter: Payer: Self-pay | Admitting: Oncology

## 2015-04-08 ENCOUNTER — Telehealth: Payer: Self-pay

## 2015-04-08 DIAGNOSIS — C5701 Malignant neoplasm of right fallopian tube: Secondary | ICD-10-CM

## 2015-04-08 MED ORDER — PROCHLORPERAZINE MALEATE 10 MG PO TABS: 10.0000 mg | ORAL_TABLET | Freq: Four times a day (QID) | ORAL | Status: DC | PRN

## 2015-04-08 MED ORDER — DEXAMETHASONE 4 MG PO TABS: ORAL_TABLET | ORAL | Status: DC

## 2015-04-08 MED ORDER — ONDANSETRON HCL 8 MG PO TABS: 8.0000 mg | ORAL_TABLET | Freq: Three times a day (TID) | ORAL | Status: DC | PRN

## 2015-04-08 NOTE — Telephone Encounter (Signed)
-----   Message from Gordy Levan, MD sent at 04/07/2015  5:06 PM EST ----- First taxol carbo ~ 2-27 Will use Aloxi with treatment  zofran 8 mg every 8 hrs prn nausea, ok to begin 72 hrs after chemo #30 1 RF  (she has 4 mg tabs at home now)  Compazine 10 mg every 6 hrs prn nausea  #20 1 RF  Decadron 4 mg  Five tabs with food 12 hrs and 6 hrs prior to chemo #10  Told her to get Claritin, to begin day of chemo  She already has ativan at home. Told her to take ativan night of chemo and should take either ativan or compazine AM after chemo, whether or not any nausea  She will come 2-21 for UA C&S as lab had closed today; need to let her know results Hopefully she can pick up schedule when she comes on 2-21  RN please speak with her by phone before first chemo, to go over nausea meds and times for decadron.  Thank you

## 2015-04-08 NOTE — Telephone Encounter (Signed)
Sent prescriptions to patient's  pharmacy as noted below.

## 2015-04-09 ENCOUNTER — Ambulatory Visit: Payer: BLUE CROSS/BLUE SHIELD

## 2015-04-09 ENCOUNTER — Other Ambulatory Visit (HOSPITAL_BASED_OUTPATIENT_CLINIC_OR_DEPARTMENT_OTHER): Payer: BLUE CROSS/BLUE SHIELD

## 2015-04-09 DIAGNOSIS — N390 Urinary tract infection, site not specified: Secondary | ICD-10-CM

## 2015-04-09 LAB — URINALYSIS, MICROSCOPIC - CHCC
Bacteria, UA: NEGATIVE
Bilirubin (Urine): NEGATIVE
Blood: NEGATIVE
Glucose: NEGATIVE mg/dL
Ketones: NEGATIVE mg/dL
Leukocyte Esterase: NEGATIVE
Nitrite: NEGATIVE
Protein: NEGATIVE mg/dL
RBC / HPF: NEGATIVE (ref 0–2)
Specific Gravity, Urine: 1.01 (ref 1.003–1.035)
Urobilinogen, UR: 0.2 mg/dL (ref 0.2–1)
WBC, UA: NEGATIVE (ref 0–2)
pH: 6 (ref 4.6–8.0)

## 2015-04-09 NOTE — Telephone Encounter (Signed)
Discussed nausea meds with Kathryn Ramsey as noted below by Dr. [name]. Gave appointment times for treatment , follow up with Dr. [name], and genetics counseling.   Kathryn Ramsey will come in today at 1430 to give urine specimen in follow up of Monday 04-07-15 appointment. Kathryn Ramsey verbalized understanding.[name]

## 2015-04-10 LAB — URINE CULTURE

## 2015-04-11 ENCOUNTER — Telehealth: Payer: Self-pay

## 2015-04-11 NOTE — Telephone Encounter (Signed)
LM in Ms. Ernest's voice mail stating that the urine culture from 04-09-15 shows that she does not have a UTI as noted below by Dr. [name].

## 2015-04-11 NOTE — Telephone Encounter (Signed)
-----   Message from Gordy Levan, MD sent at 04/11/2015  8:17 AM EST ----- Labs seen and need follow up please let her know no infection

## 2015-04-14 ENCOUNTER — Other Ambulatory Visit (HOSPITAL_BASED_OUTPATIENT_CLINIC_OR_DEPARTMENT_OTHER): Payer: BLUE CROSS/BLUE SHIELD

## 2015-04-14 ENCOUNTER — Ambulatory Visit (HOSPITAL_BASED_OUTPATIENT_CLINIC_OR_DEPARTMENT_OTHER): Payer: BLUE CROSS/BLUE SHIELD

## 2015-04-14 VITALS — BP 108/63 | HR 75 | Temp 98.5°F | Resp 16

## 2015-04-14 DIAGNOSIS — C762 Malignant neoplasm of abdomen: Secondary | ICD-10-CM

## 2015-04-14 DIAGNOSIS — Z5111 Encounter for antineoplastic chemotherapy: Secondary | ICD-10-CM

## 2015-04-14 DIAGNOSIS — C482 Malignant neoplasm of peritoneum, unspecified: Secondary | ICD-10-CM

## 2015-04-14 LAB — COMPREHENSIVE METABOLIC PANEL
ALT: 30 U/L (ref 0–55)
AST: 21 U/L (ref 5–34)
Albumin: 4.3 g/dL (ref 3.5–5.0)
Alkaline Phosphatase: 59 U/L (ref 40–150)
Anion Gap: 12 mEq/L — ABNORMAL HIGH (ref 3–11)
BUN: 9.5 mg/dL (ref 7.0–26.0)
CO2: 21 mEq/L — ABNORMAL LOW (ref 22–29)
Calcium: 9.7 mg/dL (ref 8.4–10.4)
Chloride: 104 mEq/L (ref 98–109)
Creatinine: 0.8 mg/dL (ref 0.6–1.1)
EGFR: >90 mL/min/{1.73_m2} (ref >90)
Glucose: 215 mg/dl — ABNORMAL HIGH (ref 70–140)
Potassium: 4 mEq/L (ref 3.5–5.1)
Sodium: 138 mEq/L (ref 136–145)
Total Bilirubin: 0.37 mg/dL (ref 0.20–1.20)
Total Protein: 8.3 g/dL (ref 6.4–8.3)

## 2015-04-14 LAB — CBC WITH DIFFERENTIAL/PLATELET
BASO%: 0.1 % (ref 0.0–2.0)
Basophils Absolute: 0 10*3/uL (ref 0.0–0.1)
EOS%: 0 % (ref 0.0–7.0)
Eosinophils Absolute: 0 10*3/uL (ref 0.0–0.5)
HCT: 37.1 % (ref 34.8–46.6)
HGB: 12.5 g/dL (ref 11.6–15.9)
LYMPH%: 9.5 % — ABNORMAL LOW (ref 14.0–49.7)
MCH: 31 pg (ref 25.1–34.0)
MCHC: 33.8 g/dL (ref 31.5–36.0)
MCV: 91.7 fL (ref 79.5–101.0)
MONO#: 0 10*3/uL — ABNORMAL LOW (ref 0.1–0.9)
MONO%: 0.5 % (ref 0.0–14.0)
NEUT#: 4.8 10*3/uL (ref 1.5–6.5)
NEUT%: 89.9 % — ABNORMAL HIGH (ref 38.4–76.8)
Platelets: 437 10*3/uL — ABNORMAL HIGH (ref 145–400)
RBC: 4.04 10*6/uL (ref 3.70–5.45)
RDW: 12.6 % (ref 11.2–14.5)
WBC: 5.4 10*3/uL (ref 3.9–10.3)
lymph#: 0.5 10*3/uL — ABNORMAL LOW (ref 0.9–3.3)

## 2015-04-14 MED ORDER — PACLITAXEL CHEMO INJECTION 300 MG/50ML
175.0000 mg/m2 | Freq: Once | INTRAVENOUS | Status: AC
  Administered 2015-04-14: 270 mg via INTRAVENOUS
  Filled 2015-04-14: qty 45

## 2015-04-14 MED ORDER — FAMOTIDINE IN NACL 20-0.9 MG/50ML-% IV SOLN
INTRAVENOUS | Status: AC
  Filled 2015-04-14: qty 50

## 2015-04-14 MED ORDER — CARBOPLATIN CHEMO INJECTION 600 MG/60ML
476.5000 mg | Freq: Once | INTRAVENOUS | Status: AC
  Administered 2015-04-14: 480 mg via INTRAVENOUS
  Filled 2015-04-14: qty 48

## 2015-04-14 MED ORDER — SODIUM CHLORIDE 0.9 % IV SOLN
20.0000 mg | Freq: Once | INTRAVENOUS | Status: AC
  Administered 2015-04-14: 20 mg via INTRAVENOUS
  Filled 2015-04-14: qty 2

## 2015-04-14 MED ORDER — LORAZEPAM 2 MG/ML IJ SOLN
0.5000 mg | Freq: Once | INTRAMUSCULAR | Status: AC | PRN
  Administered 2015-04-14: 0.5 mg via INTRAVENOUS

## 2015-04-14 MED ORDER — PALONOSETRON HCL INJECTION 0.25 MG/5ML
0.2500 mg | Freq: Once | INTRAVENOUS | Status: AC
  Administered 2015-04-14: 0.25 mg via INTRAVENOUS

## 2015-04-14 MED ORDER — FAMOTIDINE IN NACL 20-0.9 MG/50ML-% IV SOLN
20.0000 mg | Freq: Once | INTRAVENOUS | Status: AC
  Administered 2015-04-14: 20 mg via INTRAVENOUS

## 2015-04-14 MED ORDER — DIPHENHYDRAMINE HCL 50 MG/ML IJ SOLN
50.0000 mg | Freq: Once | INTRAMUSCULAR | Status: AC
  Administered 2015-04-14: 50 mg via INTRAVENOUS

## 2015-04-14 MED ORDER — PALONOSETRON HCL INJECTION 0.25 MG/5ML
INTRAVENOUS | Status: AC
  Filled 2015-04-14: qty 5

## 2015-04-14 MED ORDER — SODIUM CHLORIDE 0.9 % IV SOLN
Freq: Once | INTRAVENOUS | Status: AC
  Administered 2015-04-14: 11:00:00 via INTRAVENOUS

## 2015-04-14 MED ORDER — DIPHENHYDRAMINE HCL 50 MG/ML IJ SOLN
INTRAMUSCULAR | Status: AC
  Filled 2015-04-14: qty 1

## 2015-04-14 MED ORDER — LORAZEPAM 2 MG/ML IJ SOLN
INTRAMUSCULAR | Status: AC
  Filled 2015-04-14: qty 1

## 2015-04-14 NOTE — Patient Instructions (Signed)
[facility] Discharge Instructions for Patients Receiving Chemotherapy  Today you received the following chemotherapy agents Taxol and Carboplatin.  To help prevent nausea and vomiting after your treatment, we encourage you to take your nausea medication as prescribed.   If you develop nausea and vomiting that is not controlled by your nausea medication, call the clinic.   BELOW ARE SYMPTOMS THAT SHOULD BE REPORTED IMMEDIATELY:  *FEVER GREATER THAN 100.5 F  *CHILLS WITH OR WITHOUT FEVER  NAUSEA AND VOMITING THAT IS NOT CONTROLLED WITH YOUR NAUSEA MEDICATION  *UNUSUAL SHORTNESS OF BREATH  *UNUSUAL BRUISING OR BLEEDING  TENDERNESS IN MOUTH AND THROAT WITH OR WITHOUT PRESENCE OF ULCERS  *URINARY PROBLEMS  *BOWEL PROBLEMS  UNUSUAL RASH Items with * indicate a potential emergency and should be followed up as soon as possible.  Feel free to call the clinic you have any questions or concerns. The clinic phone number is [phone].  Please show the CHEMO ALERT CARD at check-in to the Emergency Department and triage nurse.   

## 2015-04-15 ENCOUNTER — Telehealth: Payer: Self-pay

## 2015-04-15 NOTE — Telephone Encounter (Signed)
lvm chemo f/u call, call if any questions or concerns

## 2015-04-15 NOTE — Telephone Encounter (Signed)
-----   Message from Gordy Levan, MD sent at 04/07/2015  5:06 PM EST ----- First taxol carbo ~ 2-27 Will use Aloxi with treatment  zofran 8 mg every 8 hrs prn nausea, ok to begin 72 hrs after chemo #30 1 RF  (she has 4 mg tabs at home now)  Compazine 10 mg every 6 hrs prn nausea  #20 1 RF  Decadron 4 mg  Five tabs with food 12 hrs and 6 hrs prior to chemo #10  Told her to get Claritin, to begin day of chemo  She already has ativan at home. Told her to take ativan night of chemo and should take either ativan or compazine AM after chemo, whether or not any nausea  She will come 2-21 for UA C&S as lab had closed today; need to let her know results Hopefully she can pick up schedule when she comes on 2-21  RN please speak with her by phone before first chemo, to go over nausea meds and times for decadron.  Thank you

## 2015-04-16 ENCOUNTER — Ambulatory Visit (HOSPITAL_BASED_OUTPATIENT_CLINIC_OR_DEPARTMENT_OTHER): Payer: BLUE CROSS/BLUE SHIELD

## 2015-04-16 VITALS — BP 108/67 | HR 65 | Temp 98.3°F | Resp 18

## 2015-04-16 DIAGNOSIS — E860 Dehydration: Secondary | ICD-10-CM

## 2015-04-16 DIAGNOSIS — C482 Malignant neoplasm of peritoneum, unspecified: Secondary | ICD-10-CM | POA: Diagnosis not present

## 2015-04-16 MED ORDER — SODIUM CHLORIDE 0.9 % IV SOLN
Freq: Once | INTRAVENOUS | Status: AC
  Administered 2015-04-16: 15:00:00 via INTRAVENOUS

## 2015-04-16 NOTE — Patient Instructions (Signed)

## 2015-04-17 ENCOUNTER — Telehealth: Payer: Self-pay

## 2015-04-17 NOTE — Telephone Encounter (Signed)
Patient calling today with questions regarding nausea and constipation.  Patient was instructed to add zofran on to her nausea regimen along with the compazine she has already been taking.  Patient feels that part of the reason she is so nauseated today is because of the constipation.  It has been over 3 days since she has had a good BM, although she did pass stool today.  Patient has been using colace with little to no results.  Writer instructed her to get the nausea under control and then encouraged her to take miralax which she has used in the past.  Patient was also encouraged to  drink fluids and eat small amounts of food frequently.  Patient stated understanding and will call and update tomorrow morning.

## 2015-04-17 NOTE — Telephone Encounter (Signed)
Re constipation and nausea Please be sure RN follows up on 3-3  thanks

## 2015-04-18 ENCOUNTER — Ambulatory Visit: Payer: BLUE CROSS/BLUE SHIELD

## 2015-04-18 NOTE — Telephone Encounter (Signed)
Clarified lovenox rx with Gyn/onc. She is to complete the 28 days and then stop. Called pt with information.

## 2015-04-18 NOTE — Telephone Encounter (Signed)
Pt did take zofran and it really helped the nausea. She is using miralax and her bowels are moving again. She feels much better.

## 2015-04-20 ENCOUNTER — Other Ambulatory Visit: Payer: Self-pay | Admitting: Oncology

## 2015-04-21 ENCOUNTER — Ambulatory Visit (HOSPITAL_BASED_OUTPATIENT_CLINIC_OR_DEPARTMENT_OTHER): Payer: BLUE CROSS/BLUE SHIELD | Admitting: Oncology

## 2015-04-21 ENCOUNTER — Telehealth: Payer: Self-pay | Admitting: Oncology

## 2015-04-21 ENCOUNTER — Other Ambulatory Visit (HOSPITAL_BASED_OUTPATIENT_CLINIC_OR_DEPARTMENT_OTHER): Payer: BLUE CROSS/BLUE SHIELD

## 2015-04-21 ENCOUNTER — Encounter: Payer: Self-pay | Admitting: Oncology

## 2015-04-21 VITALS — BP 122/67 | HR 65 | Temp 98.0°F | Resp 18 | Ht 63.0 in | Wt 116.6 lb

## 2015-04-21 DIAGNOSIS — T451X5A Adverse effect of antineoplastic and immunosuppressive drugs, initial encounter: Secondary | ICD-10-CM

## 2015-04-21 DIAGNOSIS — C5701 Malignant neoplasm of right fallopian tube: Secondary | ICD-10-CM

## 2015-04-21 DIAGNOSIS — D701 Agranulocytosis secondary to cancer chemotherapy: Secondary | ICD-10-CM

## 2015-04-21 DIAGNOSIS — C762 Malignant neoplasm of abdomen: Secondary | ICD-10-CM

## 2015-04-21 DIAGNOSIS — K589 Irritable bowel syndrome without diarrhea: Secondary | ICD-10-CM

## 2015-04-21 DIAGNOSIS — R922 Inconclusive mammogram: Secondary | ICD-10-CM

## 2015-04-21 DIAGNOSIS — R110 Nausea: Secondary | ICD-10-CM

## 2015-04-21 DIAGNOSIS — C482 Malignant neoplasm of peritoneum, unspecified: Secondary | ICD-10-CM

## 2015-04-21 LAB — CBC WITH DIFFERENTIAL/PLATELET
BASO%: 0.7 % (ref 0.0–2.0)
Basophils Absolute: 0 10*3/uL (ref 0.0–0.1)
EOS%: 3.7 % (ref 0.0–7.0)
Eosinophils Absolute: 0.1 10*3/uL (ref 0.0–0.5)
HCT: 35.6 % (ref 34.8–46.6)
HGB: 12 g/dL (ref 11.6–15.9)
LYMPH%: 57.8 % — ABNORMAL HIGH (ref 14.0–49.7)
MCH: 30.8 pg (ref 25.1–34.0)
MCHC: 33.7 g/dL (ref 31.5–36.0)
MCV: 91.4 fL (ref 79.5–101.0)
MONO#: 0.1 10*3/uL (ref 0.1–0.9)
MONO%: 3.9 % (ref 0.0–14.0)
NEUT#: 0.9 10*3/uL — ABNORMAL LOW (ref 1.5–6.5)
NEUT%: 33.9 % — ABNORMAL LOW (ref 38.4–76.8)
Platelets: 299 10*3/uL (ref 145–400)
RBC: 3.89 10*6/uL (ref 3.70–5.45)
RDW: 12.6 % (ref 11.2–14.5)
WBC: 2.7 10*3/uL — ABNORMAL LOW (ref 3.9–10.3)
lymph#: 1.6 10*3/uL (ref 0.9–3.3)

## 2015-04-21 LAB — COMPREHENSIVE METABOLIC PANEL
ALT: 59 U/L — ABNORMAL HIGH (ref 0–55)
AST: 34 U/L (ref 5–34)
Albumin: 4.3 g/dL (ref 3.5–5.0)
Alkaline Phosphatase: 56 U/L (ref 40–150)
Anion Gap: 9 mEq/L (ref 3–11)
BUN: 8.4 mg/dL (ref 7.0–26.0)
CO2: 28 mEq/L (ref 22–29)
Calcium: 9.2 mg/dL (ref 8.4–10.4)
Chloride: 101 mEq/L (ref 98–109)
Creatinine: 0.7 mg/dL (ref 0.6–1.1)
EGFR: >90 mL/min/{1.73_m2} (ref >90)
Glucose: 94 mg/dl (ref 70–140)
Potassium: 4.2 mEq/L (ref 3.5–5.1)
Sodium: 138 mEq/L (ref 136–145)
Total Bilirubin: 0.33 mg/dL (ref 0.20–1.20)
Total Protein: 7.8 g/dL (ref 6.4–8.3)

## 2015-04-21 MED ORDER — TBO-FILGRASTIM 300 MCG/0.5ML ~~LOC~~ SOSY
300.0000 ug | PREFILLED_SYRINGE | Freq: Once | SUBCUTANEOUS | Status: AC
  Administered 2015-04-21: 300 ug via SUBCUTANEOUS
  Filled 2015-04-21: qty 0.5

## 2015-04-21 NOTE — Telephone Encounter (Signed)
appt made and avs printed °

## 2015-04-21 NOTE — Progress Notes (Signed)
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 She is neutropenic today (day 8 cycle 1) with ANC 0.9 and will begin granix; counts likely are not yet at nadir.  Appointment with genetics counseling 04-29-15.  Patient has tolerated first chemotherapy otherwise fairly well. She had no problems with oral decadron, tolerated IV benadryl better at slower rate, and likely will not need day 3 IVF with subsequent cycles. She did have Aloxi with chemotherapy. She felt well on day 2, had nausea beginning night of day 3 (despite IVF that day) thru day 4-5, but no vomiting and was able to keep down po's including fluids and was better after adding zofran on day 3. She had no bowel movement for ~ 3 days prior to chemo and then constipation continued another 2-3 days after treatment, resolved with miralax. She has had no fever or symptoms of infection. Peripheral IV access was not difficult. She has resumed walking in neighborhood, trying for 6000 steps daily. She has had some dull HA even without zofran, not requiring medication. She has some soreness lateral abdomen/ flanks on both sides since surgery, but no frank taxol aches, did take claritin starting day of chemo for several days.  She is having no significant hot flashes on estrogen patch at lower dose now, per Dr Denman George; she is concerned about using exogenous estrogen. No LE swelling. Has 3 lovenox injections remaining, no bleeding. No peripheral neuropathy. No central line Flu vaccine 03-25-15  ONCOLOGIC HISTORY Patient has been generally healthy, post vaginal hysterectomy for fibroids and menorrhagia 2012. In ~ 11-2014 she noticed bladder pressure and occasional trace blood ? Vaginal vs  urinary. She was seen by Dr Pilar Jarvis, with cystoscopy 01-31-15 suggesting extrinsic compression of bladder. She had CT AP at [facility] 03-04-14, with moderate ascites, omental caking in lateral left abdomen and pelvis, absent uterus, "ovaries are likely visualized" , no adenopathy, no hydronephrosis or renal stones, lung bases clear, hemangioma posterior right hepatic lobe (CT report to be scanned into this EMR). CA 125 by Dr Collene Mares 03-06-15 was 957, with CEA 1.0. She had colonoscopy by Dr Collene Mares on 03-12-15, reportedly unremarkable. She had consultation with Dr Denman George on 03-17-15, with plan for initial surgery if amenable to optimal debulking, otherwise to begin with neoadjuvant chemotherapy. CT chest 03-21-15 no findings of concern. She had exploratory laparotomy with BSO, omentectomy and radical R0 debulking on 03-27-15, pathology ([id number]) high grade serous carcinoma involving bilateral tubes and ovaries, omentum and peritoneal nodule, final diagnosis IIIC right fallopian. She began chemotherapy with carboplatin and taxol on 04-14-15. She was neutropenic by day 8 cycle 1, ANC 0.9.    Objective:  Vital signs in last 24 hours:  BP 122/67 mmHg  Pulse 65  Temp(Src) 98 F (36.7 C) (Oral)  Resp 18  Ht '5\' 3"'  (1.6 m)  Wt 116 lb 9.6 oz (52.889 kg)  BMI 20.66 kg/m2  SpO2 100% Weight up 1 lb. Alert, oriented and appropriate. Ambulatory without difficulty.  No alopecia  HEENT:PERRL, sclerae not icteric. Oral mucosa moist without lesions, posterior pharynx clear.  Neck supple. No JVD.  Lymphatics:no cervical,supraclavicular, axillary or inguinal adenopathy Resp: clear to auscultation bilaterally and normal percussion bilaterally Cardio: regular rate and rhythm. No gallop. GI: soft, nontender,  not distended, no mass or organomegaly. Normally active bowel sounds. Surgical incision appears to be healing well, some surgical glue remaining. Musculoskeletal/ Extremities: without pitting edema, cords,  tenderness Neuro: no peripheral neuropathy. Otherwise nonfocal Skin without rash, ecchymosis, petechiae. No problems at site of peripheral IV access for first chemo.  Lab Results:  Results for orders placed or performed in visit on 04/21/15  CBC with Differential  Result Value Ref Range   WBC 2.7 (L) 3.9 - 10.3 10e3/uL   NEUT# 0.9 (L) 1.5 - 6.5 10e3/uL   HGB 12.0 11.6 - 15.9 g/dL   HCT 35.6 34.8 - 46.6 %   Platelets 299 145 - 400 10e3/uL   MCV 91.4 79.5 - 101.0 fL   MCH 30.8 25.1 - 34.0 pg   MCHC 33.7 31.5 - 36.0 g/dL   RBC 3.89 3.70 - 5.45 10e6/uL   RDW 12.6 11.2 - 14.5 %   lymph# 1.6 0.9 - 3.3 10e3/uL   MONO# 0.1 0.1 - 0.9 10e3/uL   Eosinophils Absolute 0.1 0.0 - 0.5 10e3/uL   Basophils Absolute 0.0 0.0 - 0.1 10e3/uL   NEUT% 33.9 (L) 38.4 - 76.8 %   LYMPH% 57.8 (H) 14.0 - 49.7 %   MONO% 3.9 0.0 - 14.0 %   EOS% 3.7 0.0 - 7.0 %   BASO% 0.7 0.0 - 2.0 %  Comprehensive metabolic panel  Result Value Ref Range   Sodium 138 136 - 145 mEq/L   Potassium 4.2 3.5 - 5.1 mEq/L   Chloride 101 98 - 109 mEq/L   CO2 28 22 - 29 mEq/L   Glucose 94 70 - 140 mg/dl   BUN 8.4 7.0 - 26.0 mg/dL   Creatinine 0.7 0.6 - 1.1 mg/dL   Total Bilirubin 0.33 0.20 - 1.20 mg/dL   Alkaline Phosphatase 56 40 - 150 U/L   AST 34 5 - 34 U/L   ALT 59 (H) 0 - 55 U/L   Total Protein 7.8 6.4 - 8.3 g/dL   Albumin 4.3 3.5 - 5.0 g/dL   Calcium 9.2 8.4 - 10.4 mg/dL   Anion Gap 9 3 - 11 mEq/L   EGFR >90 >90 ml/min/1.73 m2     Studies/Results: [facility] 03-26-15 DIGITAL SCREENING BILATERAL MAMMOGRAM WITH 3D TOMO WITH CAD  COMPARISON: Previous exam(s).  ACR Breast Density Category c: The breast tissue is heterogeneously dense, which may obscure small masses.  FINDINGS: There are no findings suspicious for malignancy. Images were processed with CAD.  IMPRESSION: No mammographic evidence of malignancy. A result letter of this screening mammogram will be mailed directly to the  patient.  RECOMMENDATION: Screening mammogram in one year. (Code:[id number]-01Y)  BI-RADS CATEGORY 1: Negative.  NOTE her insurance would not cover the 3D tho breast tissue dense and this was medically appropriate.   Medications: I have reviewed the patient's current medications. She will continue estrogen patch for now, at least pending genetics information and further discussion with Dr Denman George.  Can give benadryl slowly IV or po with subsequent treatments. Should increase laxatives as needed to have bowels move well just prior to chemo. Will not schedule IVF after chemo next cycle Granix 300 mg today, 3-7 and depending on counts 3-8 (as likely not yet at nadir) and possibly longer. She will take Claritin starting today thru gCSF doses.   DISCUSSION Interval history reviewed as above. They cancelled ski trip originally planned this week. Neutropenia and neutropenic precautions discussed by MD and RN. Mechanism of action and possible aches with gCSF  discussed.   Patient and husband are pleased that she tolerated treatment this well so far and are in agreement with recommendations and plans as above. They know to call for temperature >=100.5, symptoms of infection or other concerns prior to next visit .    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 Adjuvant carboplatin taxol begun 04-14-15. Referral for genetics counseling. She will be due cycle 2 on 05-05-15 if [place] >=1.5 and plt >=100k.  2.chemotherapy induced neutropenia: neutropenic precautions, granix begun now. WIll give granix also on 3-7 the repeat CBC on 3-8 and may need additional doses for this cycle; she will need gCSF with each subsequent cycle of this chemotherapy. 3.post hysterectomy for benign indications 2012. History of endometriosis. 4.irritable bowel syndrome x years, known to Dr Collene Mares. Would not use probiotics while neutropenic. Will need to use laxatives at  least prn with chemo. 5. Flu vaccine 03-25-15 6.heterogeneously dense breast tissue including most recent 3D mammograms at [facility] 03-26-15 7. Post cholecystecytomy 8.surgery for crushed left femur age 50 9.no UTI by UA/ culture last week   All questions answered and they know to call if concerns prior to next scheduled visit. Granix orders placed. Time spent 25 min including >50% counseling and coordination of care. Cc Dr Denman George and Dr Jeremy Johann, MD   04/21/2015, 4:22 PM

## 2015-04-22 ENCOUNTER — Ambulatory Visit (HOSPITAL_BASED_OUTPATIENT_CLINIC_OR_DEPARTMENT_OTHER): Payer: BLUE CROSS/BLUE SHIELD

## 2015-04-22 ENCOUNTER — Other Ambulatory Visit: Payer: Self-pay | Admitting: Oncology

## 2015-04-22 VITALS — BP 127/65 | HR 80 | Temp 98.2°F

## 2015-04-22 DIAGNOSIS — D701 Agranulocytosis secondary to cancer chemotherapy: Secondary | ICD-10-CM | POA: Diagnosis not present

## 2015-04-22 DIAGNOSIS — T451X5A Adverse effect of antineoplastic and immunosuppressive drugs, initial encounter: Secondary | ICD-10-CM

## 2015-04-22 DIAGNOSIS — R110 Nausea: Secondary | ICD-10-CM | POA: Insufficient documentation

## 2015-04-22 DIAGNOSIS — C762 Malignant neoplasm of abdomen: Secondary | ICD-10-CM

## 2015-04-22 MED ORDER — TBO-FILGRASTIM 300 MCG/0.5ML ~~LOC~~ SOSY
300.0000 ug | PREFILLED_SYRINGE | Freq: Once | SUBCUTANEOUS | Status: AC
  Administered 2015-04-22: 300 ug via SUBCUTANEOUS
  Filled 2015-04-22: qty 0.5

## 2015-04-23 ENCOUNTER — Other Ambulatory Visit: Payer: Self-pay | Admitting: Oncology

## 2015-04-23 ENCOUNTER — Other Ambulatory Visit: Payer: Self-pay | Admitting: Gynecologic Oncology

## 2015-04-23 ENCOUNTER — Other Ambulatory Visit (HOSPITAL_BASED_OUTPATIENT_CLINIC_OR_DEPARTMENT_OTHER): Payer: BLUE CROSS/BLUE SHIELD

## 2015-04-23 ENCOUNTER — Ambulatory Visit: Payer: BLUE CROSS/BLUE SHIELD

## 2015-04-23 DIAGNOSIS — C482 Malignant neoplasm of peritoneum, unspecified: Secondary | ICD-10-CM

## 2015-04-23 LAB — CBC WITH DIFFERENTIAL/PLATELET
BASO%: 0.3 % (ref 0.0–2.0)
Basophils Absolute: 0 10*3/uL (ref 0.0–0.1)
EOS%: 2 % (ref 0.0–7.0)
Eosinophils Absolute: 0.1 10*3/uL (ref 0.0–0.5)
HCT: 35.6 % (ref 34.8–46.6)
HGB: 12 g/dL (ref 11.6–15.9)
LYMPH%: 27.6 % (ref 14.0–49.7)
MCH: 31.2 pg (ref 25.1–34.0)
MCHC: 33.7 g/dL (ref 31.5–36.0)
MCV: 92.5 fL (ref 79.5–101.0)
MONO#: 0.6 10*3/uL (ref 0.1–0.9)
MONO%: 9.7 % (ref 0.0–14.0)
NEUT#: 3.7 10*3/uL (ref 1.5–6.5)
NEUT%: 60.4 % (ref 38.4–76.8)
Platelets: 239 10*3/uL (ref 145–400)
RBC: 3.85 10*6/uL (ref 3.70–5.45)
RDW: 12.8 % (ref 11.2–14.5)
WBC: 6.1 10*3/uL (ref 3.9–10.3)
lymph#: 1.7 10*3/uL (ref 0.9–3.3)

## 2015-04-23 LAB — COMPREHENSIVE METABOLIC PANEL
ALT: 64 U/L — ABNORMAL HIGH (ref 0–55)
AST: 34 U/L (ref 5–34)
Albumin: 4.1 g/dL (ref 3.5–5.0)
Alkaline Phosphatase: 63 U/L (ref 40–150)
Anion Gap: 9 mEq/L (ref 3–11)
BUN: 8.7 mg/dL (ref 7.0–26.0)
CO2: 26 mEq/L (ref 22–29)
Calcium: 9 mg/dL (ref 8.4–10.4)
Chloride: 103 mEq/L (ref 98–109)
Creatinine: 0.7 mg/dL (ref 0.6–1.1)
EGFR: >90 mL/min/{1.73_m2} (ref >90)
Glucose: 77 mg/dl (ref 70–140)
Potassium: 4 mEq/L (ref 3.5–5.1)
Sodium: 138 mEq/L (ref 136–145)
Total Bilirubin: 0.46 mg/dL (ref 0.20–1.20)
Total Protein: 7.3 g/dL (ref 6.4–8.3)

## 2015-04-23 NOTE — Progress Notes (Signed)
Kathryn Ramsey here for labs and possible Granix injection.  ANC 3.7,  HGB 12.0, PLTC 239 today.  Called Dr [name] and recieved instructions that she is not to get injection today.

## 2015-04-23 NOTE — Progress Notes (Signed)
Patient called stating she has had a headache ever since she was changed to the generic minivelle.  She states she thinks she wants to stop the patch all together and see how her body reacts.  She is to call our office with an update after stopping the patch.  Advised to call for any needs or concerns.

## 2015-04-25 ENCOUNTER — Telehealth: Payer: Self-pay

## 2015-04-25 NOTE — Telephone Encounter (Signed)
Kathryn Ramsey called stating that she has a mouth ulcer in the left back corner on her cheek. Onset Tuesday evening.  It is not interfering with eating or drinking.  She is using warm saline and baking soda rises am and mid day. Suggesed increasing the frequency to qid and HS. Offered to send prescription for dental paste.  Pt declinedthe sore is not that bad. .  She will discuss with Dr. [name] at visit Monday 3-13-17if needed.

## 2015-04-27 ENCOUNTER — Other Ambulatory Visit: Payer: Self-pay | Admitting: Oncology

## 2015-04-27 DIAGNOSIS — C5701 Malignant neoplasm of right fallopian tube: Secondary | ICD-10-CM

## 2015-04-28 ENCOUNTER — Other Ambulatory Visit: Payer: Self-pay

## 2015-04-28 ENCOUNTER — Other Ambulatory Visit (HOSPITAL_BASED_OUTPATIENT_CLINIC_OR_DEPARTMENT_OTHER): Payer: BLUE CROSS/BLUE SHIELD

## 2015-04-28 ENCOUNTER — Encounter: Payer: Self-pay | Admitting: Oncology

## 2015-04-28 ENCOUNTER — Ambulatory Visit (HOSPITAL_BASED_OUTPATIENT_CLINIC_OR_DEPARTMENT_OTHER): Payer: BLUE CROSS/BLUE SHIELD | Admitting: Oncology

## 2015-04-28 VITALS — BP 116/70 | HR 77 | Temp 97.5°F | Resp 18 | Ht 63.0 in | Wt 115.8 lb

## 2015-04-28 DIAGNOSIS — C5701 Malignant neoplasm of right fallopian tube: Secondary | ICD-10-CM | POA: Diagnosis not present

## 2015-04-28 DIAGNOSIS — T451X5A Adverse effect of antineoplastic and immunosuppressive drugs, initial encounter: Secondary | ICD-10-CM

## 2015-04-28 DIAGNOSIS — K589 Irritable bowel syndrome without diarrhea: Secondary | ICD-10-CM

## 2015-04-28 DIAGNOSIS — D701 Agranulocytosis secondary to cancer chemotherapy: Secondary | ICD-10-CM

## 2015-04-28 LAB — CBC WITH DIFFERENTIAL/PLATELET
BASO%: 0.7 % (ref 0.0–2.0)
Basophils Absolute: 0.1 10*3/uL (ref 0.0–0.1)
EOS%: 0.8 % (ref 0.0–7.0)
Eosinophils Absolute: 0.1 10*3/uL (ref 0.0–0.5)
HCT: 40.1 % (ref 34.8–46.6)
HGB: 13.3 g/dL (ref 11.6–15.9)
LYMPH%: 31.8 % (ref 14.0–49.7)
MCH: 30.5 pg (ref 25.1–34.0)
MCHC: 33.2 g/dL (ref 31.5–36.0)
MCV: 92 fL (ref 79.5–101.0)
MONO#: 0.5 10*3/uL (ref 0.1–0.9)
MONO%: 7 % (ref 0.0–14.0)
NEUT#: 4 10*3/uL (ref 1.5–6.5)
NEUT%: 59.7 % (ref 38.4–76.8)
Platelets: 242 10*3/uL (ref 145–400)
RBC: 4.36 10*6/uL (ref 3.70–5.45)
RDW: 13 % (ref 11.2–14.5)
WBC: 6.8 10*3/uL (ref 3.9–10.3)
lymph#: 2.2 10*3/uL (ref 0.9–3.3)

## 2015-04-28 LAB — COMPREHENSIVE METABOLIC PANEL
ALT: 57 U/L — ABNORMAL HIGH (ref 0–55)
AST: 31 U/L (ref 5–34)
Albumin: 4.6 g/dL (ref 3.5–5.0)
Alkaline Phosphatase: 66 U/L (ref 40–150)
Anion Gap: 10 mEq/L (ref 3–11)
BUN: 11.3 mg/dL (ref 7.0–26.0)
CO2: 25 mEq/L (ref 22–29)
Calcium: 9.6 mg/dL (ref 8.4–10.4)
Chloride: 105 mEq/L (ref 98–109)
Creatinine: 0.8 mg/dL (ref 0.6–1.1)
EGFR: 85 mL/min/{1.73_m2} — ABNORMAL LOW (ref >90)
Glucose: 115 mg/dl (ref 70–140)
Potassium: 3.9 mEq/L (ref 3.5–5.1)
Sodium: 140 mEq/L (ref 136–145)
Total Bilirubin: 0.36 mg/dL (ref 0.20–1.20)
Total Protein: 8.1 g/dL (ref 6.4–8.3)

## 2015-04-28 MED ORDER — DEXAMETHASONE 4 MG PO TABS: ORAL_TABLET | ORAL | Status: DC

## 2015-04-28 NOTE — Progress Notes (Signed)
OFFICE PROGRESS NOTE   April 28, 2015   Physicians: [name], Harlan Stains, MD, [facility], Jyothi Collene Mares, Baruch Gouty (urology)  INTERVAL HISTORY:   Patient is seen, together with friend, in continuing attention to adjuvant chemotherapy in process for IIIC right fallopian carcinoma, cycle 1 carbo taxol given 04-14-15. She was neutropenic with ANC 0.9 on day 8 cycle 1, counts recovering with 2 granix then.   Patient has felt well for past few days, with improved energy and appetite. Bowels are moving more regularly with bid colace and increased fiber. She is tends to overdo activity, such as exercise walking. She denies pain, nausea, fever or symptoms of infection, any bleeding. She had some aching from granix, tho this was not severe and likewise aches after taxol not severe. No peripheral neuropathy. No SOB. No abdominal or pelvic discomfort other than bowels. Remainder of 10 point Review of Systems negative.   No central line Flu vaccine 03-25-15  ONCOLOGIC HISTORY Patient has been generally healthy, post vaginal hysterectomy for fibroids and menorrhagia 2012. In ~ 11-2014 she noticed bladder pressure and occasional trace blood ? Vaginal vs urinary. She was seen by Dr Pilar Jarvis, with cystoscopy 01-31-15 suggesting extrinsic compression of bladder. She had CT AP at [facility] 03-04-14, with moderate ascites, omental caking in lateral left abdomen and pelvis, absent uterus, "ovaries are likely visualized" , no adenopathy, no hydronephrosis or renal stones, lung bases clear, hemangioma posterior right hepatic lobe (CT report to be scanned into this EMR). CA 125 by Dr Collene Mares 03-06-15 was 957, with CEA 1.0. She had colonoscopy by Dr Collene Mares on 03-12-15, reportedly unremarkable. She had consultation with Dr Denman George on 03-17-15, with plan for initial surgery if amenable to optimal debulking, otherwise to begin with neoadjuvant chemotherapy. CT chest 03-21-15 no findings of concern. She had exploratory  laparotomy with BSO, omentectomy and radical R0 debulking on 03-27-15, pathology ([id number]) high grade serous carcinoma involving bilateral tubes and ovaries, omentum and peritoneal nodule, final diagnosis IIIC right fallopian. She began chemotherapy with carboplatin and taxol on 04-14-15. She was neutropenic by day 8 cycle 1, ANC 0.9.  Objective:  Vital signs in last 24 hours:  BP 116/70 mmHg  Pulse 77  Temp(Src) 97.5 F (36.4 C) (Oral)  Resp 18  Ht '5\' 3"'$  (1.6 m)  Wt 115 lb 12.8 oz (52.527 kg)  BMI 20.52 kg/m2  SpO2 100% Weight down 1 lb Alert, oriented and appropriate. Ambulatory without difficulty. Looks comfortable, very pleasant and talkative  HEENT:PERRL, sclerae not icteric. Oral mucosa moist without lesions, posterior pharynx clear.  Neck supple. No JVD.  Lymphatics:no supraclavicular or inguinal adenopathy Resp: clear to auscultation bilaterally and normal percussion bilaterally Cardio: regular rate and rhythm. No gallop. GI: soft, nontender, not distended, no mass or organomegaly. Normally active bowel sounds. Surgical incision well healed, very thin scar Musculoskeletal/ Extremities: without pitting edema, cords, tenderness Neuro: no peripheral neuropathy. Otherwise nonfocal. PSYCH appropriate mood and affect Skin without rash, ecchymosis, petechiae  Lab Results:  Results for orders placed or performed in visit on 04/28/15  CBC with Differential  Result Value Ref Range   WBC 6.8 3.9 - 10.3 10e3/uL   NEUT# 4.0 1.5 - 6.5 10e3/uL   HGB 13.3 11.6 - 15.9 g/dL   HCT 40.1 34.8 - 46.6 %   Platelets 242 145 - 400 10e3/uL   MCV 92.0 79.5 - 101.0 fL   MCH 30.5 25.1 - 34.0 pg   MCHC 33.2 31.5 - 36.0 g/dL   RBC  4.36 3.70 - 5.45 10e6/uL   RDW 13.0 11.2 - 14.5 %   lymph# 2.2 0.9 - 3.3 10e3/uL   MONO# 0.5 0.1 - 0.9 10e3/uL   Eosinophils Absolute 0.1 0.0 - 0.5 10e3/uL   Basophils Absolute 0.1 0.0 - 0.1 10e3/uL   NEUT% 59.7 38.4 - 76.8 %   LYMPH% 31.8 14.0 - 49.7 %   MONO% 7.0  0.0 - 14.0 %   EOS% 0.8 0.0 - 7.0 %   BASO% 0.7 0.0 - 2.0 %  Comprehensive metabolic panel  Result Value Ref Range   Sodium 140 136 - 145 mEq/L   Potassium 3.9 3.5 - 5.1 mEq/L   Chloride 105 98 - 109 mEq/L   CO2 25 22 - 29 mEq/L   Glucose 115 70 - 140 mg/dl   BUN 11.3 7.0 - 26.0 mg/dL   Creatinine 0.8 0.6 - 1.1 mg/dL   Total Bilirubin 0.36 0.20 - 1.20 mg/dL   Alkaline Phosphatase 66 40 - 150 U/L   AST 31 5 - 34 U/L   ALT 57 (H) 0 - 55 U/L   Total Protein 8.1 6.4 - 8.3 g/dL   Albumin 4.6 3.5 - 5.0 g/dL   Calcium 9.6 8.4 - 10.4 mg/dL   Anion Gap 10 3 - 11 mEq/L   EGFR 85 (L) >90 ml/min/1.73 m2    CA 125 available after visit down to 165, this having been 1226 on 03-20-15 prior to surgery 03-27-15  Studies/Results:  No results found.  Medications: I have reviewed the patient's current medications. Will schedule granix for days 2 and 3 cycle 2. Will not give IVF unless problems after this cycle. OK for probiotics now, tho I prefer she not take these if counts low. Carbo AUC increased from 5 with cycle 1 to 6 for cycle 2.  DISCUSSION Medications as above.  She prefers that we discuss CA 125 results at visits rather than phone calls for now. We have discussed elevation in this marker related to disruption of peritoneal linings with surgery, such that initial value after surgery may not reflect just malignancy. (NOTE also IBS) She is pleased with how she has tolerated first cycle overall and in agreement with continuing as planned For genetics counseling 04-29-15. OK to delay that lab draw to coordinate with other planned labs, message sent to genetics department in this regard.  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 Adjuvant carboplatin taxol begun 04-14-15. Genetics counseling later this week. She will have cycle 2 on 05-05-15 if [place] >=1.5 and plt >=100k by lab that day, with granix on days 2 and 3. I will see her  with lab on 3-27 2.chemotherapy induced neutropenia: neutropenic precautions, granix begun now. WIll give granix also on 3-7 the repeat CBC on 3-8 and may need additional doses for this cycle; she will need gCSF with each subsequent cycle of this chemotherapy. 3.post hysterectomy for benign indications 2012. History of endometriosis. 4.irritable bowel syndrome x years, known to Dr Collene Mares. Would not use probiotics while neutropenic. Will need to use laxatives at least prn with chemo. 5. Flu vaccine 03-25-15 6.heterogeneously dense breast tissue including most recent 3D mammograms at [place] 03-26-15 7. Post cholecystecytomy 8.surgery for crushed left femur age 63   All questions answered. Chemo orders confirmed, granix orders placed. Time spent 25 min including >50% counseling and coordination of care. CC Dr Dema Severin, Dr Brenton Grills, MD   04/28/2015,  3:59 PM

## 2015-04-29 ENCOUNTER — Other Ambulatory Visit: Payer: BLUE CROSS/BLUE SHIELD

## 2015-04-29 ENCOUNTER — Ambulatory Visit (HOSPITAL_BASED_OUTPATIENT_CLINIC_OR_DEPARTMENT_OTHER): Payer: BLUE CROSS/BLUE SHIELD | Admitting: Genetic Counselor

## 2015-04-29 ENCOUNTER — Encounter: Payer: Self-pay | Admitting: Genetic Counselor

## 2015-04-29 DIAGNOSIS — Z807 Family history of other malignant neoplasms of lymphoid, hematopoietic and related tissues: Secondary | ICD-10-CM

## 2015-04-29 DIAGNOSIS — Z803 Family history of malignant neoplasm of breast: Secondary | ICD-10-CM | POA: Diagnosis not present

## 2015-04-29 DIAGNOSIS — Z840 Family history of diseases of the skin and subcutaneous tissue: Secondary | ICD-10-CM | POA: Diagnosis not present

## 2015-04-29 DIAGNOSIS — C5701 Malignant neoplasm of right fallopian tube: Secondary | ICD-10-CM | POA: Diagnosis not present

## 2015-04-29 DIAGNOSIS — Z8489 Family history of other specified conditions: Secondary | ICD-10-CM | POA: Diagnosis not present

## 2015-04-29 DIAGNOSIS — Z8051 Family history of malignant neoplasm of kidney: Secondary | ICD-10-CM | POA: Diagnosis not present

## 2015-04-29 DIAGNOSIS — Z808 Family history of malignant neoplasm of other organs or systems: Secondary | ICD-10-CM

## 2015-04-29 LAB — CA 125: Cancer Antigen (CA) 125: 165 U/mL — ABNORMAL HIGH (ref 0.0–38.1)

## 2015-04-29 NOTE — Progress Notes (Signed)
REFERRING PROVIDER: Evlyn Clines, MD  OTHER PROVIDER(S): ROSSI, EMMA, MD  PRIMARY PROVIDER:  Vidal Schwalbe, MD  PRIMARY REASON FOR VISIT:  1. 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 Family history of breast cancer in female   3. Family history of renal cancer   4. Family history of brain tumor   5. Family history of brain cancer   6. Family history of non-Hodgkin's lymphoma   7. Family history of nonmelanoma skin cancer      HISTORY OF PRESENT ILLNESS:   Kathryn Ramsey, a 50 y.o. female, was seen for a [place] cancer genetics consultation at the request of Dr. [name] due to a personal history of fallopian tube carcinoma and family history of breast, renal, and other cancers.  Kathryn Ramsey presents to clinic today with her husband to discuss the possibility of a hereditary predisposition to cancer, genetic testing, and to further clarify her future cancer risks, as well as potential cancer risks for family members.   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 This was treated with TAH-BSO.  CANCER HISTORY:   No history exists.     HORMONAL RISK FACTORS:  Menarche was at age 74.  First live birth at age 42.  OCP use for approximately 3 months.  Ovaries intact: No.  Hysterectomy: Yes.  Menopausal status: Postmenopausal.  HRT use: used estrogen patch for just under 3 years. Colonoscopy: Yes; normal. Mammogram within the last year: Yes. Number of breast biopsies: 0. Up to date with pelvic exams:  Yes. Any excessive radiation exposure in the past:  Reports hx of "a lot of x-rays" from previous health issues (including IBS, etc.); also reports some secondhand smoke exposure as a child  Past Medical History  Diagnosis Date  . Skin cancer   . Allergy   . Blood transfusion without reported diagnosis     at age 23 years old D/T surgery to femur being crushed  . Complication of anesthesia     hx. of  allergic to ether (had surgery at 5 years and had reaction to the ether  . Asthma     illness induced asthma  . Anemia   . PONV (postoperative nausea and vomiting)   . Heart murmur     pt, states had a "working heart murmur"    Past Surgical History  Procedure Laterality Date  . Cholecystectomy      2010  . Abdominal hysterectomy      2010  . Sinus surgery 1994    . 3 laporscopic proceedures    . Dilation and curettage of uterus      1997  . Laparoscopy N/A 03/27/2015    Procedure: DIAGNOSTIC LAPAROSCOPY ;  Surgeon: [name], MD;  Location: [facility];  Service: Gynecology;  Laterality: N/A;  . Laparotomy N/A 03/27/2015    Procedure: EXPLORATORY LAPAROTOMY, BILATERAL SALPINGO OOPHORECTOMY, OMENTECTOMY, RADICAL TUMOR DEBULKING;  Surgeon: [name], MD;  Location: [facility];  Service: Gynecology;  Laterality: N/A;    Social History   Social History  . Marital Status: Married    Spouse Name: N/A  . Number of Children: N/A  . Years of Education: N/A   Social History Main Topics  . Smoking status: Never Smoker   . Smokeless tobacco: Never Used  . Alcohol Use: Yes     Comment: socially  . Drug Use: No  . Sexual Activity: Not on file  Other Topics Concern  . Not on file   Social History Narrative     FAMILY HISTORY:  We obtained a detailed, 4-generation family history.  Significant diagnoses are listed below: Family History  Problem Relation Age of Onset  . Hypertension Father   . Skin cancer Father     nonmelanoma skin cancers in his late 54s  . Non-Hodgkin's lymphoma Maternal Aunt     dx. 2s; smoker  . Stroke Maternal Grandmother   . Other Mother     benign meningioma dx. early-mid-70s; hysterectomy in her late 56s for heavy periods - still has ovaries  . Other Son     one son with pre-cancerous skin findings  . Other Daughter     cysts on ovaries and hx of heavy periods  . Other Sister     hysterectomy for cysts - still has ovaries  . Heart attack Maternal  Grandfather   . Heart attack Paternal Grandfather   . Renal cancer Maternal Aunt 60    smoker  . Breast cancer Maternal Aunt 78  . Other Maternal Aunt     dx. benign brain tumor (meningioma) at 77; 2nd benign brain tumor in her late 49s; hx of radical hysterectomy at age 62  . Brain cancer Other     NOS tumor    Kathryn Ramsey has three sons and one daughter, ages 9-26.  Her oldest son has a history of pre-cancerous skin findings, but he worked as a Automotive engineer.  Her daughter has a history large cysts and heavy periods.  Kathryn Ramsey has three full sisters and one brother, all in their 21s-60s.  None of Kathryn Ramsey's siblings have had cancer; one sister has a history of a hysterectomy due to cysts and she still has her ovaries.  Kathryn Ramsey mother is currently 52 and has never had cancer.  She does have a history of a hysterectomy (in her late 53s; still has her ovaries) for heavy periods and she also has a benign meningioma with was first diagnosed in her early-mid 39s.  Kathryn Ramsey father is currently 69 and he has a history of non-melanoma skin cancer which was diagnosed recently.  Kathryn Ramsey mother had two full sisters and one full brother, all of whom have passed away.  One sister died in her 14s; she had a history of non-Hodgkin's lymphoma.  Another sister died in her 15s, but had a history of multiple cancers including: renal cancer at 12 and breast cancer at 9.  This same sister had a radical hysterectomy at age 38 and had a history of a benign meningioma brain tumor diagnosed and removed at 33 and another diagnosed in her late 72s.  This maternal aunt had two sons--neither of whom have had cancer.  The brother died in his late 84s.  He was never diagnosed with cancer, but reportedly had physical and mental health issues following a scarlet fever diagnosis as a child. Ms. Chou maternal grandmother died of a stroke in her early 25s.  She had a brother who died of a malignant brain tumor (NOS).  Ms. Korus  father died of a heart attack in his 37s.  Ms. Suminski has no further information for other maternal great aunts/uncles or great grandparents.   Kathryn Ramsey father was an only child.  His mother died at the age of 56.  His father died of a heart attack at the age of 102-65.  She has no information for any paternal great aunts/uncles or  great grandparents.    Patient's maternal and paternal ancestors are of Caucasian descent. There is no reported Ashkenazi Jewish ancestry. There is no known consanguinity.  GENETIC COUNSELING ASSESSMENT: Kathryn Ramsey is a 50 y.o. female with a personal and family history of cancer which is somewhat suggestive of a hereditary cancer syndrome and predisposition to cancer. We, therefore, discussed and recommended the following at today's visit.   DISCUSSION: We reviewed the characteristics, features and inheritance patterns of hereditary cancer syndromes, particularly those caused by mutations within the BRCA1/2 and Lynch syndrome genes. We also discussed genetic testing, including the appropriate family members to test, the process of testing, insurance coverage and turn-around-time for results. We discussed the implications of a negative, positive and/or variant of uncertain significant result. We recommended Kathryn Ramsey pursue genetic testing for the [employer] through [facility] [employer] [facility].  The Breast/Ovarian Cancer Panel offered by [facility] Hope Pigeon, MD) includes sequencing and deletion/duplication analysis for the following 19 genes:  ATM, BARD1, BRCA1, BRCA2, BRIP1, CDH1, CHEK2, FANCC, MLH1, MSH2, MSH6, NBN, PALB2, PMS2, PTEN, RAD51C, RAD51D, TP53, and XRCC2.  This panel also includes deletion/duplication analysis (without sequencing) for one gene, EPCAM.  Based on Kathryn Ramsey's personal history of cancer, she meets medical criteria for genetic testing. Despite that she meets criteria, she may still have an out of pocket cost. We discussed that  if her out of pocket cost for testing is over $100, the laboratory will call and confirm whether she wants to proceed with testing.  If the out of pocket cost of testing is less than $100 she will be billed by the genetic testing laboratory.    PLAN: After considering the risks, benefits, and limitations, Kathryn Ramsey  provided informed consent to pursue genetic testing and the blood sample will be drawn with her other lab work on Monday, March 20th and then will be sent to [facility] for analysis of the 20-gene Breast/Ovarian Cancer Panel. Results should be available within approximately 2-3 weeks' time from the date of the blood draw, at which point they will be disclosed by telephone to Kathryn Ramsey, as will any additional recommendations warranted by these results. Kathryn Ramsey will receive a summary of her genetic counseling visit and a copy of her results once available. This information will also be available in Epic. We encouraged Kathryn Ramsey to remain in contact with cancer genetics annually so that we can continuously update the family history and inform her of any changes in cancer genetics and testing that may be of benefit for her family. Kathryn Ramsey questions were answered to her satisfaction today. Our contact information was provided should additional questions or concerns arise.  Thank you for the referral and allowing [name] to share in the care of your patient.   Jeanine Luz, MS, [facility] Certified Genetic Counselor [email or web address] Phone: [phone]  The patient was seen for a total of 60 minutes in face-to-face genetic counseling.  This patient was discussed with Drs. Magrinat, Lindi Adie and/or Burr Medico who agrees with the above.    _______________________________________________________________________ For Office Staff:  Number of people involved in session: 3 Was an Intern/ student involved with case: YES

## 2015-04-30 ENCOUNTER — Telehealth: Payer: Self-pay | Admitting: *Deleted

## 2015-04-30 ENCOUNTER — Other Ambulatory Visit: Payer: Self-pay | Admitting: Oncology

## 2015-04-30 ENCOUNTER — Telehealth: Payer: Self-pay | Admitting: Oncology

## 2015-04-30 NOTE — Telephone Encounter (Signed)
I have adjusted 3/20 appt so patient can have labs

## 2015-04-30 NOTE — Telephone Encounter (Signed)
Made appts per LL 3/15 pof. Pt aware

## 2015-05-02 ENCOUNTER — Other Ambulatory Visit: Payer: BLUE CROSS/BLUE SHIELD

## 2015-05-05 ENCOUNTER — Ambulatory Visit (HOSPITAL_BASED_OUTPATIENT_CLINIC_OR_DEPARTMENT_OTHER): Payer: BLUE CROSS/BLUE SHIELD

## 2015-05-05 ENCOUNTER — Other Ambulatory Visit (HOSPITAL_BASED_OUTPATIENT_CLINIC_OR_DEPARTMENT_OTHER): Payer: BLUE CROSS/BLUE SHIELD

## 2015-05-05 VITALS — BP 119/57 | HR 79 | Temp 97.8°F | Resp 18

## 2015-05-05 DIAGNOSIS — C482 Malignant neoplasm of peritoneum, unspecified: Secondary | ICD-10-CM

## 2015-05-05 DIAGNOSIS — Z5111 Encounter for antineoplastic chemotherapy: Secondary | ICD-10-CM

## 2015-05-05 DIAGNOSIS — C762 Malignant neoplasm of abdomen: Secondary | ICD-10-CM

## 2015-05-05 LAB — CBC WITH DIFFERENTIAL/PLATELET
BASO%: 0 % (ref 0.0–2.0)
Basophils Absolute: 0 10*3/uL (ref 0.0–0.1)
EOS%: 0 % (ref 0.0–7.0)
Eosinophils Absolute: 0 10*3/uL (ref 0.0–0.5)
HCT: 37 % (ref 34.8–46.6)
HGB: 12.7 g/dL (ref 11.6–15.9)
LYMPH%: 10.9 % — ABNORMAL LOW (ref 14.0–49.7)
MCH: 31.3 pg (ref 25.1–34.0)
MCHC: 34.3 g/dL (ref 31.5–36.0)
MCV: 91.1 fL (ref 79.5–101.0)
MONO#: 0 10*3/uL — ABNORMAL LOW (ref 0.1–0.9)
MONO%: 0.4 % (ref 0.0–14.0)
NEUT#: 4.9 10*3/uL (ref 1.5–6.5)
NEUT%: 88.7 % — ABNORMAL HIGH (ref 38.4–76.8)
Platelets: 244 10*3/uL (ref 145–400)
RBC: 4.06 10*6/uL (ref 3.70–5.45)
RDW: 13.3 % (ref 11.2–14.5)
WBC: 5.5 10*3/uL (ref 3.9–10.3)
lymph#: 0.6 10*3/uL — ABNORMAL LOW (ref 0.9–3.3)
nRBC: 0 % (ref 0–0)

## 2015-05-05 LAB — COMPREHENSIVE METABOLIC PANEL
ALT: 23 U/L (ref 0–55)
AST: 16 U/L (ref 5–34)
Albumin: 4.5 g/dL (ref 3.5–5.0)
Alkaline Phosphatase: 58 U/L (ref 40–150)
Anion Gap: 12 mEq/L — ABNORMAL HIGH (ref 3–11)
BUN: 10.8 mg/dL (ref 7.0–26.0)
CO2: 23 mEq/L (ref 22–29)
Calcium: 9.6 mg/dL (ref 8.4–10.4)
Chloride: 103 mEq/L (ref 98–109)
Creatinine: 0.8 mg/dL (ref 0.6–1.1)
EGFR: 89 mL/min/{1.73_m2} — ABNORMAL LOW (ref >90)
Glucose: 235 mg/dl — ABNORMAL HIGH (ref 70–140)
Potassium: 4 mEq/L (ref 3.5–5.1)
Sodium: 138 mEq/L (ref 136–145)
Total Bilirubin: 0.55 mg/dL (ref 0.20–1.20)
Total Protein: 8.2 g/dL (ref 6.4–8.3)

## 2015-05-05 MED ORDER — SODIUM CHLORIDE 0.9 % IV SOLN
20.0000 mg | Freq: Once | INTRAVENOUS | Status: AC
  Administered 2015-05-05: 20 mg via INTRAVENOUS
  Filled 2015-05-05: qty 2

## 2015-05-05 MED ORDER — SODIUM CHLORIDE 0.9 % IV SOLN
Freq: Once | INTRAVENOUS | Status: AC
  Administered 2015-05-05: 09:00:00 via INTRAVENOUS

## 2015-05-05 MED ORDER — DIPHENHYDRAMINE HCL 50 MG/ML IJ SOLN
INTRAMUSCULAR | Status: AC
  Filled 2015-05-05: qty 1

## 2015-05-05 MED ORDER — FAMOTIDINE IN NACL 20-0.9 MG/50ML-% IV SOLN
INTRAVENOUS | Status: AC
  Filled 2015-05-05: qty 50

## 2015-05-05 MED ORDER — SODIUM CHLORIDE 0.9 % IV SOLN
571.8000 mg | Freq: Once | INTRAVENOUS | Status: AC
  Administered 2015-05-05: 570 mg via INTRAVENOUS
  Filled 2015-05-05: qty 57

## 2015-05-05 MED ORDER — FAMOTIDINE IN NACL 20-0.9 MG/50ML-% IV SOLN
20.0000 mg | Freq: Once | INTRAVENOUS | Status: AC
  Administered 2015-05-05: 20 mg via INTRAVENOUS

## 2015-05-05 MED ORDER — PACLITAXEL CHEMO INJECTION 300 MG/50ML
175.0000 mg/m2 | Freq: Once | INTRAVENOUS | Status: AC
  Administered 2015-05-05: 270 mg via INTRAVENOUS
  Filled 2015-05-05: qty 45

## 2015-05-05 MED ORDER — PALONOSETRON HCL INJECTION 0.25 MG/5ML
INTRAVENOUS | Status: AC
  Filled 2015-05-05: qty 5

## 2015-05-05 MED ORDER — LORAZEPAM 2 MG/ML IJ SOLN
0.5000 mg | Freq: Once | INTRAMUSCULAR | Status: AC | PRN
  Administered 2015-05-05: 0.5 mg via INTRAVENOUS

## 2015-05-05 MED ORDER — PALONOSETRON HCL INJECTION 0.25 MG/5ML
0.2500 mg | Freq: Once | INTRAVENOUS | Status: AC
  Administered 2015-05-05: 0.25 mg via INTRAVENOUS

## 2015-05-05 MED ORDER — LORAZEPAM 2 MG/ML IJ SOLN
INTRAMUSCULAR | Status: AC
  Filled 2015-05-05: qty 1

## 2015-05-05 MED ORDER — DIPHENHYDRAMINE HCL 50 MG/ML IJ SOLN
25.0000 mg | Freq: Once | INTRAMUSCULAR | Status: AC
  Administered 2015-05-05: 25 mg via INTRAVENOUS

## 2015-05-05 NOTE — Patient Instructions (Signed)
[facility] Discharge Instructions for Patients Receiving Chemotherapy  Today you received the following chemotherapy agents Taxol and Carboplatin. To help prevent nausea and vomiting after your treatment, we encourage you to take your nausea medication as directed.  If you develop nausea and vomiting that is not controlled by your nausea medication, call the clinic.   BELOW ARE SYMPTOMS THAT SHOULD BE REPORTED IMMEDIATELY:  *FEVER GREATER THAN 100.5 F  *CHILLS WITH OR WITHOUT FEVER  NAUSEA AND VOMITING THAT IS NOT CONTROLLED WITH YOUR NAUSEA MEDICATION  *UNUSUAL SHORTNESS OF BREATH  *UNUSUAL BRUISING OR BLEEDING  TENDERNESS IN MOUTH AND THROAT WITH OR WITHOUT PRESENCE OF ULCERS  *URINARY PROBLEMS  *BOWEL PROBLEMS  UNUSUAL RASH Items with * indicate a potential emergency and should be followed up as soon as possible.  Feel free to call the clinic you have any questions or concerns. The clinic phone number is [phone].  Please show the CHEMO ALERT CARD at check-in to the Emergency Department and triage nurse.    

## 2015-05-06 ENCOUNTER — Ambulatory Visit: Payer: BLUE CROSS/BLUE SHIELD

## 2015-05-07 ENCOUNTER — Ambulatory Visit (HOSPITAL_BASED_OUTPATIENT_CLINIC_OR_DEPARTMENT_OTHER): Payer: BLUE CROSS/BLUE SHIELD

## 2015-05-07 VITALS — BP 112/79 | HR 67 | Temp 97.7°F

## 2015-05-07 DIAGNOSIS — C5701 Malignant neoplasm of right fallopian tube: Secondary | ICD-10-CM | POA: Diagnosis not present

## 2015-05-07 DIAGNOSIS — D701 Agranulocytosis secondary to cancer chemotherapy: Secondary | ICD-10-CM | POA: Diagnosis not present

## 2015-05-07 DIAGNOSIS — C762 Malignant neoplasm of abdomen: Secondary | ICD-10-CM

## 2015-05-07 MED ORDER — TBO-FILGRASTIM 300 MCG/0.5ML ~~LOC~~ SOSY
300.0000 ug | PREFILLED_SYRINGE | Freq: Once | SUBCUTANEOUS | Status: AC
  Administered 2015-05-07: 300 ug via SUBCUTANEOUS
  Filled 2015-05-07: qty 0.5

## 2015-05-08 ENCOUNTER — Ambulatory Visit (HOSPITAL_BASED_OUTPATIENT_CLINIC_OR_DEPARTMENT_OTHER): Payer: BLUE CROSS/BLUE SHIELD

## 2015-05-08 ENCOUNTER — Other Ambulatory Visit: Payer: Self-pay | Admitting: Oncology

## 2015-05-08 VITALS — BP 120/79 | HR 65 | Temp 97.8°F

## 2015-05-08 DIAGNOSIS — D701 Agranulocytosis secondary to cancer chemotherapy: Secondary | ICD-10-CM | POA: Diagnosis not present

## 2015-05-08 DIAGNOSIS — C762 Malignant neoplasm of abdomen: Secondary | ICD-10-CM

## 2015-05-08 MED ORDER — TBO-FILGRASTIM 300 MCG/0.5ML ~~LOC~~ SOSY
300.0000 ug | PREFILLED_SYRINGE | Freq: Once | SUBCUTANEOUS | Status: AC
  Administered 2015-05-08: 300 ug via SUBCUTANEOUS
  Filled 2015-05-08: qty 0.5

## 2015-05-09 ENCOUNTER — Telehealth: Payer: Self-pay | Admitting: *Deleted

## 2015-05-09 NOTE — Telephone Encounter (Signed)
Pt called

## 2015-05-09 NOTE — Telephone Encounter (Signed)
Pt called to TRIAGE to state concern of headache that started on this Wed post chemo on Monday.  " everything seems to be going well like before except I have gotten a headache this time "  Kathryn Ramsey states headache " is like behind the right eye "  Pain is sharp " kinda like a drill "  intermittent and not associated with any postioning or activity.  Pain does not interfere with ADL's including sleep.  No visual or balance changes.  Pt able to eat and drink well.  Pt took ibuprofen pm 3/23 " and I think it helped because I fell asleep but I also took my other medications "  Pt denies any vomiting - but does state she is continuing to take Zofran due to " mild queasiness "  This RN discussed with pt above may be related to ongoing use of Zofran- or could be sinus related.  If ibuprofen seemed to help yesterday pt should take a dose now - if not better after an hour she may take tylenol.  This RN requested pt to call back at noon with up date per above- and if needed additional recommendations will be given.

## 2015-05-11 ENCOUNTER — Other Ambulatory Visit: Payer: Self-pay | Admitting: Oncology

## 2015-05-12 ENCOUNTER — Telehealth: Payer: Self-pay | Admitting: Oncology

## 2015-05-12 ENCOUNTER — Ambulatory Visit (HOSPITAL_BASED_OUTPATIENT_CLINIC_OR_DEPARTMENT_OTHER): Payer: BLUE CROSS/BLUE SHIELD | Admitting: Oncology

## 2015-05-12 ENCOUNTER — Other Ambulatory Visit (HOSPITAL_BASED_OUTPATIENT_CLINIC_OR_DEPARTMENT_OTHER): Payer: BLUE CROSS/BLUE SHIELD

## 2015-05-12 ENCOUNTER — Encounter: Payer: Self-pay | Admitting: Oncology

## 2015-05-12 VITALS — BP 137/54 | HR 87 | Temp 98.6°F | Resp 18 | Ht 63.0 in | Wt 118.9 lb

## 2015-05-12 DIAGNOSIS — C5701 Malignant neoplasm of right fallopian tube: Secondary | ICD-10-CM

## 2015-05-12 DIAGNOSIS — G2581 Restless legs syndrome: Secondary | ICD-10-CM

## 2015-05-12 DIAGNOSIS — K589 Irritable bowel syndrome without diarrhea: Secondary | ICD-10-CM

## 2015-05-12 DIAGNOSIS — D701 Agranulocytosis secondary to cancer chemotherapy: Secondary | ICD-10-CM | POA: Diagnosis not present

## 2015-05-12 DIAGNOSIS — T451X5A Adverse effect of antineoplastic and immunosuppressive drugs, initial encounter: Secondary | ICD-10-CM

## 2015-05-12 DIAGNOSIS — C762 Malignant neoplasm of abdomen: Secondary | ICD-10-CM

## 2015-05-12 DIAGNOSIS — C482 Malignant neoplasm of peritoneum, unspecified: Secondary | ICD-10-CM

## 2015-05-12 LAB — COMPREHENSIVE METABOLIC PANEL
ALT: 26 U/L (ref 0–55)
AST: 24 U/L (ref 5–34)
Albumin: 4.2 g/dL (ref 3.5–5.0)
Alkaline Phosphatase: 50 U/L (ref 40–150)
Anion Gap: 7 mEq/L (ref 3–11)
BUN: 9.1 mg/dL (ref 7.0–26.0)
CO2: 28 mEq/L (ref 22–29)
Calcium: 8.9 mg/dL (ref 8.4–10.4)
Chloride: 102 mEq/L (ref 98–109)
Creatinine: 0.7 mg/dL (ref 0.6–1.1)
EGFR: >90 mL/min/{1.73_m2} (ref >90)
Glucose: 99 mg/dl (ref 70–140)
Potassium: 3.9 mEq/L (ref 3.5–5.1)
Sodium: 138 mEq/L (ref 136–145)
Total Bilirubin: 0.39 mg/dL (ref 0.20–1.20)
Total Protein: 7.2 g/dL (ref 6.4–8.3)

## 2015-05-12 LAB — CBC WITH DIFFERENTIAL/PLATELET
BASO%: 0.7 % (ref 0.0–2.0)
Basophils Absolute: 0 10*3/uL (ref 0.0–0.1)
EOS%: 1.6 % (ref 0.0–7.0)
Eosinophils Absolute: 0 10*3/uL (ref 0.0–0.5)
HCT: 32 % — ABNORMAL LOW (ref 34.8–46.6)
HGB: 10.7 g/dL — ABNORMAL LOW (ref 11.6–15.9)
LYMPH%: 64.4 % — ABNORMAL HIGH (ref 14.0–49.7)
MCH: 30.5 pg (ref 25.1–34.0)
MCHC: 33.5 g/dL (ref 31.5–36.0)
MCV: 91.1 fL (ref 79.5–101.0)
MONO#: 0.1 10*3/uL (ref 0.1–0.9)
MONO%: 6.3 % (ref 0.0–14.0)
NEUT#: 0.6 10*3/uL — ABNORMAL LOW (ref 1.5–6.5)
NEUT%: 27 % — ABNORMAL LOW (ref 38.4–76.8)
Platelets: 203 10*3/uL (ref 145–400)
RBC: 3.51 10*6/uL — ABNORMAL LOW (ref 3.70–5.45)
RDW: 12.9 % (ref 11.2–14.5)
WBC: 2.3 10*3/uL — ABNORMAL LOW (ref 3.9–10.3)
lymph#: 1.5 10*3/uL (ref 0.9–3.3)

## 2015-05-12 MED ORDER — TBO-FILGRASTIM 300 MCG/0.5ML ~~LOC~~ SOSY
300.0000 ug | PREFILLED_SYRINGE | Freq: Once | SUBCUTANEOUS | Status: AC
  Administered 2015-05-12: 300 ug via SUBCUTANEOUS
  Filled 2015-05-12: qty 0.5

## 2015-05-12 NOTE — Telephone Encounter (Signed)
Gave patient avs report and appointments for April  °

## 2015-05-12 NOTE — Progress Notes (Signed)
OFFICE PROGRESS NOTE   May 12, 2015   Physicians:Emma Milas Gain, MD, [place], Jyothi Mann, Baruch Gouty (urology)  INTERVAL HISTORY:  Patient is seen, together with husband, in continuing attention to adjuvant treatment in process for IIIC right fallopian cancer, havng had cycle 2 carbo taxol on 05-05-15 with granix on days 3 and 4. She is neutropenic today with ANC 0.6, not febrile.  Patient had restless legs during chemo thought related to IV benadryl already dose reduced to 25 mg, and has had some of that sensation also at night. She is using some compazine due to Aloxi, tho I am not clear if this is temporally related to every episode of restless legs. Some nausea without vomiting "feel it in throat". Constipation since chemo with bowels moving only small amount of hard stool despite one colace bid which she will increase to 2 bid and add senokot (which has been helpful in past prior to chemo). She is eating well, vinegar and salty taste most appealing. Some taxol aches but not severe by history. She has been more tired for past 2-3 days, tho still did usual brisk walking daily. No peripheral neuropathy. No fever or symptoms of infection. Blader ok. No bleeding. Peripheral IV access ok. Slight tenderness at slight bulging area superior end of surgical scar. Patient aware of palpable lump LLQ, not tender, thinks may be stool.  Remainder of 10 point Review of Systems negative.   CA 125 1226 on 03-20-2015 No central line Flu vaccine 03-25-15 Genetics counseling appointment done 04-29-15, Breast Ovarian panel by [facility] sent 05-05-15.  ONCOLOGIC HISTORY Patient has been generally healthy, post vaginal hysterectomy for fibroids and menorrhagia 2012. In ~ 11-2014 she noticed bladder pressure and occasional trace blood ? Vaginal vs urinary. She was seen by Dr Pilar Jarvis, with cystoscopy 01-31-15 suggesting extrinsic compression of bladder. She had CT AP at [facility] 03-04-14, with  moderate ascites, omental caking in lateral left abdomen and pelvis, absent uterus, "ovaries are likely visualized" , no adenopathy, no hydronephrosis or renal stones, lung bases clear, hemangioma posterior right hepatic lobe (CT report to be scanned into this EMR). CA 125 by Dr Collene Mares 03-06-15 was 957, with CEA 1.0. She had colonoscopy by Dr Collene Mares on 03-12-15, reportedly unremarkable. She had consultation with Dr Denman George on 03-17-15, with plan for initial surgery if amenable to optimal debulking, otherwise to begin with neoadjuvant chemotherapy. CT chest 03-21-15 no findings of concern. She had exploratory laparotomy with BSO, omentectomy and radical R0 debulking on 03-27-15, pathology ([id number]) high grade serous carcinoma involving bilateral tubes and ovaries, omentum and peritoneal nodule, final diagnosis IIIC right fallopian. She began chemotherapy with carboplatin and taxol on 04-14-15. She was neutropenic by day 8 cycle 1, ANC 0.9.   Objective:  Vital signs in last 24 hours:  BP 137/54 mmHg  Pulse 87  Temp(Src) 98.6 F (37 C) (Oral)  Resp 18  Ht '5\' 3"'  (1.6 m)  Wt 118 lb 14.4 oz (53.933 kg)  BMI 21.07 kg/m2  SpO2 99% Weight up 3 lbs Alert, oriented and appropriate. Ambulatory without difficulty.  Alopecia. Very pleasant and animated, does not appear in any significant discomfort.   HEENT:PERRL, sclerae not icteric. Oral mucosa moist without lesions, posterior pharynx clear.  Neck supple. No JVD.  Lymphatics:no cervical,supraclavicular, axillary or inguinal adenopathy Resp: clear to auscultation bilaterally and normal percussion bilaterally Cardio: regular rate and rhythm. No gallop. GI: soft, nontender, not distended, no mass or organomegaly. A few bowel sounds. Surgical incision  closed, still surgical glue at inferior scar. 1.5 x 2 cm slight soft bulge just to right of top of surgical incision, not clearly hernia to my exam. LLQ no clear mass, no adenopathy, patient thin and likely can appreciate  hard stool there. Musculoskeletal/ Extremities: without pitting edema, cords, tenderness Neuro: no peripheral neuropathy. Otherwise nonfocal. PSYCH apprpriate mood and affect Skin without rash, ecchymosis, petechiae   Lab Results:  Results for orders placed or performed in visit on 05/12/15  CBC with Differential  Result Value Ref Range   WBC 2.3 (L) 3.9 - 10.3 10e3/uL   NEUT# 0.6 (L) 1.5 - 6.5 10e3/uL   HGB 10.7 (L) 11.6 - 15.9 g/dL   HCT 32.0 (L) 34.8 - 46.6 %   Platelets 203 145 - 400 10e3/uL   MCV 91.1 79.5 - 101.0 fL   MCH 30.5 25.1 - 34.0 pg   MCHC 33.5 31.5 - 36.0 g/dL   RBC 3.51 (L) 3.70 - 5.45 10e6/uL   RDW 12.9 11.2 - 14.5 %   lymph# 1.5 0.9 - 3.3 10e3/uL   MONO# 0.1 0.1 - 0.9 10e3/uL   Eosinophils Absolute 0.0 0.0 - 0.5 10e3/uL   Basophils Absolute 0.0 0.0 - 0.1 10e3/uL   NEUT% 27.0 (L) 38.4 - 76.8 %   LYMPH% 64.4 (H) 14.0 - 49.7 %   MONO% 6.3 0.0 - 14.0 %   EOS% 1.6 0.0 - 7.0 %   BASO% 0.7 0.0 - 2.0 %  Comprehensive metabolic panel  Result Value Ref Range   Sodium 138 136 - 145 mEq/L   Potassium 3.9 3.5 - 5.1 mEq/L   Chloride 102 98 - 109 mEq/L   CO2 28 22 - 29 mEq/L   Glucose 99 70 - 140 mg/dl   BUN 9.1 7.0 - 26.0 mg/dL   Creatinine 0.7 0.6 - 1.1 mg/dL   Total Bilirubin 0.39 0.20 - 1.20 mg/dL   Alkaline Phosphatase 50 40 - 150 U/L   AST 24 5 - 34 U/L   ALT 26 0 - 55 U/L   Total Protein 7.2 6.4 - 8.3 g/dL   Albumin 4.2 3.5 - 5.0 g/dL   Calcium 8.9 8.4 - 10.4 mg/dL   Anion Gap 7 3 - 11 mEq/L   EGFR >90 >90 ml/min/1.73 m2   CA 125 on 05-01-15 was 165, which may reflect still some elevation from surgery on 03-27-15. Had been 1226 on 03-20-15  Studies/Results:  No results found.  Medications: I have reviewed the patient's current medications. Change benadryl to po instead of IV with chemo, but have left dose at 25 mg for now; if still restless with po 25 mg can decrease to 12.5 mg with subsequent cycles.  NOTE compazine may need to change if restless legs  seem also related to that Granix 300 mg now for #3 dose cycle 2, recheck CBC 3-28 and repeat granix then if ANC <=1.1 Decrease decadron to 4 tablets 12 hrs and 6 hrs prior to chemo Add ferrous fumarate daily on empty stomach with OJ or Vit C tablet Increase colace to 2 bid and add senokot Hold probiotics while neutropenic    DISCUSSION Meds as above, including discussion of restless legs and constipation Neutropenic precautions and plans as noted. Fatigue likely from neutropenia. Activity discussed.  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  Adjuvant carboplatin taxol begun 04-14-15, neutropenic day 8 cycle 2 today despite 2 doses granix, plan as above. Genetics testing sent and pending. Due cycle 3 on 05-26-15, MD to see with lab 05-22-15. 2.chemotherapy induced neutropenia:not febrile,  neutropenic precautions, #3 granix given now. Will check CBC on 3-18, parameters as above. WIll give granix x 3 doses for cycle 3. 3.post hysterectomy for benign indications 2012. History of endometriosis. 4.irritable bowel syndrome x years, known to Dr Collene Mares. Would not use probiotics while neutropenic. Will need to use laxatives at least prn with chemo. 5. Flu vaccine 03-25-15 6.heterogeneously dense breast tissue including most recent 3D mammograms at [place] 03-26-15 7. Post cholecystecytomy 8.surgery for crushed left femur age 51 9.restless legs with benadryl, possibly with compazine: adjust benadryl as noted, follow symptoms with compazine (compazine used with Aloxi) 10.slight bulge at upper end of surgical scar: I cannot tell that this is hernia, will let gyn onc know  Chemo orders for cycle 3 adjusted to reflect benadryl changes as above, granix x 3 days 3-4-5. All questions answered. Time spent 25 min including  >50% counseling and coordination of care. Cc PCP   Gordy Levan, MD   05/12/2015, 5:20 PM

## 2015-05-13 ENCOUNTER — Telehealth: Payer: Self-pay

## 2015-05-13 ENCOUNTER — Other Ambulatory Visit: Payer: Self-pay | Admitting: Oncology

## 2015-05-13 ENCOUNTER — Other Ambulatory Visit (HOSPITAL_BASED_OUTPATIENT_CLINIC_OR_DEPARTMENT_OTHER): Payer: BLUE CROSS/BLUE SHIELD

## 2015-05-13 ENCOUNTER — Ambulatory Visit: Payer: BLUE CROSS/BLUE SHIELD

## 2015-05-13 DIAGNOSIS — C5701 Malignant neoplasm of right fallopian tube: Secondary | ICD-10-CM | POA: Diagnosis not present

## 2015-05-13 LAB — CBC WITH DIFFERENTIAL/PLATELET
BASO%: 0.4 % (ref 0.0–2.0)
Basophils Absolute: 0 10*3/uL (ref 0.0–0.1)
EOS%: 0.7 % (ref 0.0–7.0)
Eosinophils Absolute: 0 10*3/uL (ref 0.0–0.5)
HCT: 32.5 % — ABNORMAL LOW (ref 34.8–46.6)
HGB: 10.9 g/dL — ABNORMAL LOW (ref 11.6–15.9)
LYMPH%: 30.2 % (ref 14.0–49.7)
MCH: 30.9 pg (ref 25.1–34.0)
MCHC: 33.7 g/dL (ref 31.5–36.0)
MCV: 91.8 fL (ref 79.5–101.0)
MONO#: 0.6 10*3/uL (ref 0.1–0.9)
MONO%: 11.1 % (ref 0.0–14.0)
NEUT#: 3.3 10*3/uL (ref 1.5–6.5)
NEUT%: 57.6 % (ref 38.4–76.8)
Platelets: 197 10*3/uL (ref 145–400)
RBC: 3.54 10*6/uL — ABNORMAL LOW (ref 3.70–5.45)
RDW: 12.7 % (ref 11.2–14.5)
WBC: 5.7 10*3/uL (ref 3.9–10.3)
lymph#: 1.7 10*3/uL (ref 0.9–3.3)

## 2015-05-13 MED ORDER — HEMOCYTE 324 (106 FE) MG PO TABS: 1.0000 | ORAL_TABLET | Freq: Every day | ORAL | Status: DC

## 2015-05-13 NOTE — Progress Notes (Signed)
[place] here for lab and possible Granix injection.   ANC 3.3 today, up from 0.6.  HGB 10.9 and PLTC 197.   Called Dr [name], doesn't need injection today .  Return as scheduled.

## 2015-05-13 NOTE — Telephone Encounter (Signed)
-----   Message from Gordy Levan, MD sent at 05/12/2015  2:48 PM EDT ----- Hemocyte if insurance will cover otherwise tell pharmacist DAW ferrous fumarate  325 mg daily on empty stomach with OJ or VItamin C tablet #30 3 RF if prescription  thanks

## 2015-05-13 NOTE — Telephone Encounter (Signed)
S/w pt that hemocyte is e-scribed. Her insurance may not cover it. We spoke about ferrous fumerate being less upsetting to stomach.  Pt did say she was feeling better today after her granix shot yesterday.

## 2015-05-14 DIAGNOSIS — G2581 Restless legs syndrome: Secondary | ICD-10-CM | POA: Insufficient documentation

## 2015-05-21 ENCOUNTER — Other Ambulatory Visit: Payer: Self-pay | Admitting: Oncology

## 2015-05-21 ENCOUNTER — Telehealth: Payer: Self-pay | Admitting: Genetic Counselor

## 2015-05-21 NOTE — Telephone Encounter (Signed)
Discussed with Kathryn Ramsey that her genetic test result was negative for known pathogenic mutations within any of 20 genes that would cause her to be at an increased genetic risk for breast, ovarian, or other related cancers.  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  Additionally, one other genetic testing lab is currently calling this a 'likely benign' change in the PALB2 gene.  Encouraged Kathryn Ramsey to keep her phone number up-to-date with [name], so that we can call and let her know if this change gets updated by the lab in the future.  Most likely this is a reassuring result for [name], especially since there are still many relatives who have lived to later ages in life and have not had breast or ovarian cancer.  Encouraged her to continue to follow her doctors' recommendations for future cancer screening.  Discussed that women in her family are still at a somewhat increased risk for breast and ovarian cancer, since there is the family history.  Her sisters, daughter, and nieces should make their primary doctors aware of this history so that they may receive the most appropriate screening in the future.  Kathryn Ramsey is welcome to call or email me with any questions.

## 2015-05-22 ENCOUNTER — Ambulatory Visit: Payer: Self-pay | Admitting: Genetic Counselor

## 2015-05-22 ENCOUNTER — Other Ambulatory Visit (HOSPITAL_BASED_OUTPATIENT_CLINIC_OR_DEPARTMENT_OTHER): Payer: BLUE CROSS/BLUE SHIELD

## 2015-05-22 ENCOUNTER — Encounter: Payer: Self-pay | Admitting: Oncology

## 2015-05-22 ENCOUNTER — Ambulatory Visit (HOSPITAL_BASED_OUTPATIENT_CLINIC_OR_DEPARTMENT_OTHER): Payer: BLUE CROSS/BLUE SHIELD | Admitting: Oncology

## 2015-05-22 VITALS — BP 106/71 | HR 75 | Temp 97.6°F | Resp 18 | Ht 63.0 in | Wt 119.6 lb

## 2015-05-22 DIAGNOSIS — G2581 Restless legs syndrome: Secondary | ICD-10-CM | POA: Diagnosis not present

## 2015-05-22 DIAGNOSIS — D701 Agranulocytosis secondary to cancer chemotherapy: Secondary | ICD-10-CM | POA: Diagnosis not present

## 2015-05-22 DIAGNOSIS — Z809 Family history of malignant neoplasm, unspecified: Secondary | ICD-10-CM

## 2015-05-22 DIAGNOSIS — C5701 Malignant neoplasm of right fallopian tube: Secondary | ICD-10-CM

## 2015-05-22 DIAGNOSIS — Z1379 Encounter for other screening for genetic and chromosomal anomalies: Secondary | ICD-10-CM

## 2015-05-22 DIAGNOSIS — K589 Irritable bowel syndrome without diarrhea: Secondary | ICD-10-CM | POA: Diagnosis not present

## 2015-05-22 DIAGNOSIS — T451X5A Adverse effect of antineoplastic and immunosuppressive drugs, initial encounter: Secondary | ICD-10-CM

## 2015-05-22 DIAGNOSIS — Z803 Family history of malignant neoplasm of breast: Secondary | ICD-10-CM

## 2015-05-22 LAB — COMPREHENSIVE METABOLIC PANEL
ALT: 19 U/L (ref 0–55)
AST: 21 U/L (ref 5–34)
Albumin: 4.2 g/dL (ref 3.5–5.0)
Alkaline Phosphatase: 52 U/L (ref 40–150)
Anion Gap: 9 mEq/L (ref 3–11)
BUN: 11 mg/dL (ref 7.0–26.0)
CO2: 27 mEq/L (ref 22–29)
Calcium: 9.4 mg/dL (ref 8.4–10.4)
Chloride: 105 mEq/L (ref 98–109)
Creatinine: 0.8 mg/dL (ref 0.6–1.1)
EGFR: >90 mL/min/{1.73_m2} (ref >90)
Glucose: 97 mg/dl (ref 70–140)
Potassium: 4.3 mEq/L (ref 3.5–5.1)
Sodium: 141 mEq/L (ref 136–145)
Total Bilirubin: 0.37 mg/dL (ref 0.20–1.20)
Total Protein: 7.6 g/dL (ref 6.4–8.3)

## 2015-05-22 LAB — CBC WITH DIFFERENTIAL/PLATELET
BASO%: 0.3 % (ref 0.0–2.0)
Basophils Absolute: 0 10*3/uL (ref 0.0–0.1)
EOS%: 0.4 % (ref 0.0–7.0)
Eosinophils Absolute: 0 10*3/uL (ref 0.0–0.5)
HCT: 35.2 % (ref 34.8–46.6)
HGB: 11.8 g/dL (ref 11.6–15.9)
LYMPH%: 45 % (ref 14.0–49.7)
MCH: 31 pg (ref 25.1–34.0)
MCHC: 33.5 g/dL (ref 31.5–36.0)
MCV: 92.6 fL (ref 79.5–101.0)
MONO#: 0.3 10*3/uL (ref 0.1–0.9)
MONO%: 8.2 % (ref 0.0–14.0)
NEUT#: 1.8 10*3/uL (ref 1.5–6.5)
NEUT%: 46.1 % (ref 38.4–76.8)
Platelets: 271 10*3/uL (ref 145–400)
RBC: 3.81 10*6/uL (ref 3.70–5.45)
RDW: 14.4 % (ref 11.2–14.5)
WBC: 4 10*3/uL (ref 3.9–10.3)
lymph#: 1.8 10*3/uL (ref 0.9–3.3)

## 2015-05-22 MED ORDER — DEXAMETHASONE 4 MG PO TABS: ORAL_TABLET | ORAL | Status: DC

## 2015-05-22 NOTE — Progress Notes (Signed)
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 She will need granix at least x 3 beginning 05-28-15.   Patient has felt well for past several days, enjoyed visiting [place] last weekend (son works at [employer]), tho was fatigued after returning home.  SHe notices slight tenderness at area of last chemo RUE at lower forearm; she has not noticed erythema or swelling there. Bowels are moving adequately now, appetite is good, no nausea, no taxol aches, no peripheral neuropathy. No SOB, no abdominal or pelvic discomfort, no bleeding, no LE swelling, bladder ok, no fever or symptoms of infection.  With cycle 2 she was "very energetic" on day day 3, "bouncing off walls", not clearly compazine type restlessness, may have been still related to premedication steroids. Will decrease oral premed decadron to 16 mg 12 hrs and 6 hrs prior and patient will be aware if this seems more restlessness from compazine (aloxi used with chemo, so no zofran just after chemo).  Remainder of 10 point Review of Systems negative.    CA 125 1226 on 03-20-2015 No central line Flu vaccine 03-25-15 Genetics testing negative by Breast Ovarian panel by [facility] sent 05-05-15, tho VUS with PALB2.  ONCOLOGIC HISTORY Patient has been generally healthy, post vaginal hysterectomy for fibroids and menorrhagia 2012. In ~ 11-2014 she noticed bladder pressure and occasional trace blood ? Vaginal vs urinary. She was seen by Dr Pilar Jarvis, with cystoscopy 01-31-15 suggesting extrinsic compression of bladder. She had CT AP at [facility] 03-04-14, with moderate ascites, omental caking in lateral left abdomen and pelvis, absent uterus, "ovaries are likely  visualized" , no adenopathy, no hydronephrosis or renal stones, lung bases clear, hemangioma posterior right hepatic lobe (CT report to be scanned into this EMR). CA 125 by Dr Collene Mares 03-06-15 was 957, with CEA 1.0. She had colonoscopy by Dr Collene Mares on 03-12-15, reportedly unremarkable. She had consultation with Dr Denman George on 03-17-15, with plan for initial surgery if amenable to optimal debulking, otherwise to begin with neoadjuvant chemotherapy. CT chest 03-21-15 no findings of concern. She had exploratory laparotomy with BSO, omentectomy and radical R0 debulking on 03-27-15, pathology ([id number]) high grade serous carcinoma involving bilateral tubes and ovaries, omentum and peritoneal nodule, final diagnosis IIIC right fallopian. She began chemotherapy with carboplatin and taxol on 04-14-15. She was neutropenic by day 8 cycle 1, ANC 0.9.    Objective:  Vital signs in last 24 hours:  BP 106/71 mmHg  Pulse 75  Temp(Src) 97.6 F (36.4 C) (Oral)  Resp 18  Ht _0  (1.6 m)  Wt 119 lb 9.6 oz (54.25 kg)  BMI 21.19 kg/m2  SpO2 99% Weight up 1 lb. Alert, oriented and appropriate. Ambulatory without difficulty. Looks comfortable, respirations not labored.  Alopecia  HEENT:PERRL, sclerae not icteric. Oral mucosa moist without lesions, posterior pharynx clear.  Neck supple. No JVD.  Lymphatics:no cervical,supraclavicular, axillary or inguinal adenopathy Resp: clear to auscultation bilaterally and normal percussion bilaterally Cardio: regular rate and rhythm. No gallop. GI: soft, nontender, not distended, no mass or organomegaly. Normally active bowel sounds. Surgical incision well healed Musculoskeletal/ Extremities: Right lower forearm slightly tender alone vein for ~ 4 cm, trace erythema, slightly puffy, 2 areas slightly more firm seem  larger than valves. No swelling otherwise in RUE.   LEwithout pitting edema, cords, tenderness Neuro: no peripheral neuropathy. Otherwise nonfocal PSYCH appropriate mood and  affect Skin without rash, ecchymosis, petechiae   Lab Results:  Results for orders placed or performed in visit on 05/22/15  CBC with Differential  Result Value Ref Range   WBC 4.0 3.9 - 10.3 10e3/uL   NEUT# 1.8 1.5 - 6.5 10e3/uL   HGB 11.8 11.6 - 15.9 g/dL   HCT 35.2 34.8 - 46.6 %   Platelets 271 145 - 400 10e3/uL   MCV 92.6 79.5 - 101.0 fL   MCH 31.0 25.1 - 34.0 pg   MCHC 33.5 31.5 - 36.0 g/dL   RBC 3.81 3.70 - 5.45 10e6/uL   RDW 14.4 11.2 - 14.5 %   lymph# 1.8 0.9 - 3.3 10e3/uL   MONO# 0.3 0.1 - 0.9 10e3/uL   Eosinophils Absolute 0.0 0.0 - 0.5 10e3/uL   Basophils Absolute 0.0 0.0 - 0.1 10e3/uL   NEUT% 46.1 38.4 - 76.8 %   LYMPH% 45.0 14.0 - 49.7 %   MONO% 8.2 0.0 - 14.0 %   EOS% 0.4 0.0 - 7.0 %   BASO% 0.3 0.0 - 2.0 %  Comprehensive metabolic panel  Result Value Ref Range   Sodium 141 136 - 145 mEq/L   Potassium 4.3 3.5 - 5.1 mEq/L   Chloride 105 98 - 109 mEq/L   CO2 27 22 - 29 mEq/L   Glucose 97 70 - 140 mg/dl   BUN 11.0 7.0 - 26.0 mg/dL   Creatinine 0.8 0.6 - 1.1 mg/dL   Total Bilirubin 0.37 0.20 - 1.20 mg/dL   Alkaline Phosphatase 52 40 - 150 U/L   AST 21 5 - 34 U/L   ALT 19 0 - 55 U/L   Total Protein 7.6 6.4 - 8.3 g/dL   Albumin 4.2 3.5 - 5.0 g/dL   Calcium 9.4 8.4 - 10.4 mg/dL   Anion Gap 9 3 - 11 mEq/L   EGFR >90 >90 ml/min/1.73 m2  CA 125  Result Value Ref Range   Cancer Antigen (CA) 125 43.9 (H) 0.0 - 38.1 U/mL   CA 125 available after visit, down from 165 on 04-28-15, will be communicated to patient.  Studies/Results:  No results found.  Medications: I have reviewed the patient's current medications. Add OTC NSAID ~ bid next few days for irritation at site of last IV. Discussed restlessness as possible side effect of compazine, tho description of "very energetic" on day 3 not clearly this Decrease premed oral decadron to 16 mg 12 hrs and 6 hrs.   DISCUSSION  She will use warm soaks to right forearm and OTC NSAID with food ~ 2 x daily next couple  of days. She will let us know if area worsens or does not improve, may need Keflex if so.   Meds as above  CA 125 discussed again, result pending at time of visit Genetics information reviewed.  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 Adjuvant carboplatin taxol begun 04-14-15, neutropenic day 8 cycle 2 after 2 doses granix, recovered quickly with third dose. Will have cycle 3 on 05-26-15 as long as [place] >=1.5 and plt >=100k. Adjustment in decadron, 3 days granix. Germline BRCA negative. 2.restless legs with benadryl, possibly with compazine:  benadryl adjusted, follow symptoms with compazine (compazine used with Aloxi) 3.post hysterectomy for benign indications 2012. History of endometriosis. 4.irritable bowel syndrome x years,  known to Dr Collene Mares. Would not use probiotics while neutropenic. Will need to use laxatives at least prn with chemo. 5. Flu vaccine 03-25-15 6.heterogeneously dense breast tissue including most recent 3D mammograms at [place] 03-26-15 7. Post cholecystecytomy 8.surgery for crushed left femur age 18 9.slight bulge at upper end of surgical scar: seems improved 10.irritation along vein used for #2 chemo: NSAID, warm soaks. Avoid that area with next treatment. Keflex if worse.   All questions answered and she knows to call if needed between visits. Chemo and granix orders approved. Time spent 25 min including >50% counseling and coordination of care Route to PCP, update Dr Bartholomew Boards, MD   05/24/2015, 9:33 AM

## 2015-05-23 ENCOUNTER — Telehealth: Payer: Self-pay

## 2015-05-23 LAB — CA 125: Cancer Antigen (CA) 125: 43.9 U/mL — ABNORMAL HIGH (ref 0.0–38.1)

## 2015-05-23 NOTE — Telephone Encounter (Signed)
LM in voice mail stating the results of the CA-125 from 05-22-15 as noted below by Dr. [name]. She can call the office if she has any questions or concerns.

## 2015-05-23 NOTE — Telephone Encounter (Signed)
-----   Message from Gordy Levan, MD sent at 05/23/2015  8:32 AM EDT ----- Labs seen and need follow up: please let her know ca 125 down to 44 - continue treatment as planned

## 2015-05-26 ENCOUNTER — Ambulatory Visit (HOSPITAL_BASED_OUTPATIENT_CLINIC_OR_DEPARTMENT_OTHER): Payer: BLUE CROSS/BLUE SHIELD

## 2015-05-26 ENCOUNTER — Other Ambulatory Visit (HOSPITAL_BASED_OUTPATIENT_CLINIC_OR_DEPARTMENT_OTHER): Payer: BLUE CROSS/BLUE SHIELD

## 2015-05-26 VITALS — BP 116/72 | HR 79 | Temp 98.0°F | Resp 18

## 2015-05-26 DIAGNOSIS — Z5111 Encounter for antineoplastic chemotherapy: Secondary | ICD-10-CM

## 2015-05-26 DIAGNOSIS — C762 Malignant neoplasm of abdomen: Secondary | ICD-10-CM

## 2015-05-26 DIAGNOSIS — C482 Malignant neoplasm of peritoneum, unspecified: Secondary | ICD-10-CM | POA: Diagnosis not present

## 2015-05-26 LAB — CBC WITH DIFFERENTIAL/PLATELET
BASO%: 0.2 % (ref 0.0–2.0)
Basophils Absolute: 0 10*3/uL (ref 0.0–0.1)
EOS%: 0 % (ref 0.0–7.0)
Eosinophils Absolute: 0 10*3/uL (ref 0.0–0.5)
HCT: 36.1 % (ref 34.8–46.6)
HGB: 12.1 g/dL (ref 11.6–15.9)
LYMPH%: 6.5 % — ABNORMAL LOW (ref 14.0–49.7)
MCH: 31.4 pg (ref 25.1–34.0)
MCHC: 33.6 g/dL (ref 31.5–36.0)
MCV: 93.5 fL (ref 79.5–101.0)
MONO#: 0 10*3/uL — ABNORMAL LOW (ref 0.1–0.9)
MONO%: 0.6 % (ref 0.0–14.0)
NEUT#: 6 10*3/uL (ref 1.5–6.5)
NEUT%: 92.7 % — ABNORMAL HIGH (ref 38.4–76.8)
Platelets: 318 10*3/uL (ref 145–400)
RBC: 3.87 10*6/uL (ref 3.70–5.45)
RDW: 15 % — ABNORMAL HIGH (ref 11.2–14.5)
WBC: 6.5 10*3/uL (ref 3.9–10.3)
lymph#: 0.4 10*3/uL — ABNORMAL LOW (ref 0.9–3.3)

## 2015-05-26 LAB — COMPREHENSIVE METABOLIC PANEL
ALT: 17 U/L (ref 0–55)
AST: 20 U/L (ref 5–34)
Albumin: 4.6 g/dL (ref 3.5–5.0)
Alkaline Phosphatase: 52 U/L (ref 40–150)
Anion Gap: 11 mEq/L (ref 3–11)
BUN: 15.4 mg/dL (ref 7.0–26.0)
CO2: 23 mEq/L (ref 22–29)
Calcium: 9.9 mg/dL (ref 8.4–10.4)
Chloride: 100 mEq/L (ref 98–109)
Creatinine: 0.7 mg/dL (ref 0.6–1.1)
EGFR: >90 mL/min/{1.73_m2} (ref >90)
Glucose: 143 mg/dl — ABNORMAL HIGH (ref 70–140)
Potassium: 3.9 mEq/L (ref 3.5–5.1)
Sodium: 134 mEq/L — ABNORMAL LOW (ref 136–145)
Total Bilirubin: 0.59 mg/dL (ref 0.20–1.20)
Total Protein: 8.4 g/dL — ABNORMAL HIGH (ref 6.4–8.3)

## 2015-05-26 MED ORDER — SODIUM CHLORIDE 0.9 % IV SOLN
175.0000 mg/m2 | Freq: Once | INTRAVENOUS | Status: AC
  Administered 2015-05-26: 270 mg via INTRAVENOUS
  Filled 2015-05-26: qty 45

## 2015-05-26 MED ORDER — SODIUM CHLORIDE 0.9 % IV SOLN
20.0000 mg | Freq: Once | INTRAVENOUS | Status: AC
  Administered 2015-05-26: 20 mg via INTRAVENOUS
  Filled 2015-05-26: qty 2

## 2015-05-26 MED ORDER — PALONOSETRON HCL INJECTION 0.25 MG/5ML
0.2500 mg | Freq: Once | INTRAVENOUS | Status: AC
  Administered 2015-05-26: 0.25 mg via INTRAVENOUS

## 2015-05-26 MED ORDER — FAMOTIDINE IN NACL 20-0.9 MG/50ML-% IV SOLN
INTRAVENOUS | Status: AC
  Filled 2015-05-26: qty 50

## 2015-05-26 MED ORDER — DIPHENHYDRAMINE HCL 50 MG/ML IJ SOLN
INTRAMUSCULAR | Status: AC
  Filled 2015-05-26: qty 1

## 2015-05-26 MED ORDER — SODIUM CHLORIDE 0.9 % IV SOLN
570.0000 mg | Freq: Once | INTRAVENOUS | Status: AC
  Administered 2015-05-26: 570 mg via INTRAVENOUS
  Filled 2015-05-26: qty 57

## 2015-05-26 MED ORDER — SODIUM CHLORIDE 0.9 % IV SOLN
Freq: Once | INTRAVENOUS | Status: AC
  Administered 2015-05-26: 13:00:00 via INTRAVENOUS

## 2015-05-26 MED ORDER — DIPHENHYDRAMINE HCL 50 MG/ML IJ SOLN
25.0000 mg | Freq: Once | INTRAMUSCULAR | Status: AC
  Administered 2015-05-26: 25 mg via INTRAVENOUS

## 2015-05-26 MED ORDER — PALONOSETRON HCL INJECTION 0.25 MG/5ML
INTRAVENOUS | Status: AC
  Filled 2015-05-26: qty 5

## 2015-05-26 MED ORDER — LORAZEPAM 2 MG/ML IJ SOLN
0.5000 mg | Freq: Once | INTRAMUSCULAR | Status: AC | PRN
  Administered 2015-05-26: 0.5 mg via INTRAVENOUS

## 2015-05-26 MED ORDER — LORAZEPAM 2 MG/ML IJ SOLN
INTRAMUSCULAR | Status: AC
  Filled 2015-05-26: qty 1

## 2015-05-26 MED ORDER — FAMOTIDINE IN NACL 20-0.9 MG/50ML-% IV SOLN
20.0000 mg | Freq: Once | INTRAVENOUS | Status: AC
  Administered 2015-05-26: 20 mg via INTRAVENOUS

## 2015-05-26 NOTE — Patient Instructions (Signed)
[facility] Discharge Instructions for Patients Receiving Chemotherapy  Today you received the following chemotherapy agents Taxol and Carboplatin. To help prevent nausea and vomiting after your treatment, we encourage you to take your nausea medication as directed.  If you develop nausea and vomiting that is not controlled by your nausea medication, call the clinic.   BELOW ARE SYMPTOMS THAT SHOULD BE REPORTED IMMEDIATELY:  *FEVER GREATER THAN 100.5 F  *CHILLS WITH OR WITHOUT FEVER  NAUSEA AND VOMITING THAT IS NOT CONTROLLED WITH YOUR NAUSEA MEDICATION  *UNUSUAL SHORTNESS OF BREATH  *UNUSUAL BRUISING OR BLEEDING  TENDERNESS IN MOUTH AND THROAT WITH OR WITHOUT PRESENCE OF ULCERS  *URINARY PROBLEMS  *BOWEL PROBLEMS  UNUSUAL RASH Items with * indicate a potential emergency and should be followed up as soon as possible.  Feel free to call the clinic you have any questions or concerns. The clinic phone number is [phone].  Please show the CHEMO ALERT CARD at check-in to the Emergency Department and triage nurse.    

## 2015-05-26 NOTE — Progress Notes (Signed)
1418:  Pt c/o PIV "a little painful".  No redness or swelling noted.  Positive for blood return.  NS started at 47ml/hr to run with Taxol.  Warm compress given.

## 2015-05-26 NOTE — Progress Notes (Signed)
At approx. 1650 pt family member reports that Patient's IV has came loose while in the bathroom. Infusion stopped, upon entering restroom there was a small amount of blood on the floor from the IV (Which was cleaned per protocol). PT returned to chair and states that while she was in the restroom "I felt something wet on my arm and I noticed that the IV connection had came loose so I connected it back". Pt reports that the connection did not come in contact with anything and that she immediately connected it back, with no contamination noted.  Connections checked and IV remains patent, pt reports that there is no burning or pain noted when saline infusing. Infusion restarted.

## 2015-05-26 NOTE — Progress Notes (Signed)
1418:  Pt c/o PIV "a little painful".  No redness or swelling noted.  Positive for blood return.  NS started at 110ml/hr to run with Taxol.  Warm compress given.

## 2015-05-28 ENCOUNTER — Ambulatory Visit (HOSPITAL_BASED_OUTPATIENT_CLINIC_OR_DEPARTMENT_OTHER): Payer: BLUE CROSS/BLUE SHIELD

## 2015-05-28 ENCOUNTER — Other Ambulatory Visit: Payer: Self-pay | Admitting: Oncology

## 2015-05-28 VITALS — BP 112/66 | HR 67 | Temp 98.3°F

## 2015-05-28 DIAGNOSIS — C762 Malignant neoplasm of abdomen: Secondary | ICD-10-CM

## 2015-05-28 MED ORDER — TBO-FILGRASTIM 300 MCG/0.5ML ~~LOC~~ SOSY
300.0000 ug | PREFILLED_SYRINGE | Freq: Once | SUBCUTANEOUS | Status: AC
  Administered 2015-05-28: 300 ug via SUBCUTANEOUS
  Filled 2015-05-28: qty 0.5

## 2015-05-29 ENCOUNTER — Ambulatory Visit (HOSPITAL_BASED_OUTPATIENT_CLINIC_OR_DEPARTMENT_OTHER): Payer: BLUE CROSS/BLUE SHIELD

## 2015-05-29 ENCOUNTER — Telehealth: Payer: Self-pay | Admitting: Gynecologic Oncology

## 2015-05-29 VITALS — BP 106/68 | HR 74 | Temp 98.2°F

## 2015-05-29 DIAGNOSIS — C762 Malignant neoplasm of abdomen: Secondary | ICD-10-CM

## 2015-05-29 MED ORDER — TBO-FILGRASTIM 300 MCG/0.5ML ~~LOC~~ SOSY
300.0000 ug | PREFILLED_SYRINGE | Freq: Once | SUBCUTANEOUS | Status: AC
  Administered 2015-05-29: 300 ug via SUBCUTANEOUS
  Filled 2015-05-29: qty 0.5

## 2015-05-30 ENCOUNTER — Ambulatory Visit (HOSPITAL_BASED_OUTPATIENT_CLINIC_OR_DEPARTMENT_OTHER): Payer: BLUE CROSS/BLUE SHIELD

## 2015-05-30 VITALS — BP 115/75 | HR 71 | Temp 98.4°F

## 2015-05-30 DIAGNOSIS — C762 Malignant neoplasm of abdomen: Secondary | ICD-10-CM | POA: Diagnosis not present

## 2015-05-30 MED ORDER — TBO-FILGRASTIM 300 MCG/0.5ML ~~LOC~~ SOSY
300.0000 ug | PREFILLED_SYRINGE | Freq: Once | SUBCUTANEOUS | Status: AC
  Administered 2015-05-30: 300 ug via SUBCUTANEOUS
  Filled 2015-05-30: qty 0.5

## 2015-05-30 NOTE — Telephone Encounter (Signed)
Patient called asking when she would see Kathryn Ramsey for follow up again. Informed that after the completion of 6 cycles with imaging, Dr. Marko Ramsey would reach out to our office and we would make her a follow up with Kathryn Ramsey.  All questions answered.  Advised to call for any needs or concerns.

## 2015-06-02 ENCOUNTER — Other Ambulatory Visit (HOSPITAL_BASED_OUTPATIENT_CLINIC_OR_DEPARTMENT_OTHER): Payer: BLUE CROSS/BLUE SHIELD

## 2015-06-02 ENCOUNTER — Ambulatory Visit: Payer: BLUE CROSS/BLUE SHIELD

## 2015-06-02 ENCOUNTER — Ambulatory Visit (HOSPITAL_BASED_OUTPATIENT_CLINIC_OR_DEPARTMENT_OTHER): Payer: BLUE CROSS/BLUE SHIELD | Admitting: Oncology

## 2015-06-02 ENCOUNTER — Encounter: Payer: Self-pay | Admitting: Oncology

## 2015-06-02 VITALS — BP 113/66 | HR 69 | Temp 97.8°F | Resp 18 | Ht 63.0 in | Wt 119.3 lb

## 2015-06-02 DIAGNOSIS — D701 Agranulocytosis secondary to cancer chemotherapy: Secondary | ICD-10-CM | POA: Diagnosis not present

## 2015-06-02 DIAGNOSIS — T451X5A Adverse effect of antineoplastic and immunosuppressive drugs, initial encounter: Secondary | ICD-10-CM

## 2015-06-02 DIAGNOSIS — K589 Irritable bowel syndrome without diarrhea: Secondary | ICD-10-CM | POA: Diagnosis not present

## 2015-06-02 DIAGNOSIS — C5701 Malignant neoplasm of right fallopian tube: Secondary | ICD-10-CM | POA: Diagnosis not present

## 2015-06-02 DIAGNOSIS — R110 Nausea: Secondary | ICD-10-CM

## 2015-06-02 DIAGNOSIS — C482 Malignant neoplasm of peritoneum, unspecified: Secondary | ICD-10-CM

## 2015-06-02 DIAGNOSIS — C762 Malignant neoplasm of abdomen: Secondary | ICD-10-CM

## 2015-06-02 LAB — CBC WITH DIFFERENTIAL/PLATELET
BASO%: 0.7 % (ref 0.0–2.0)
Basophils Absolute: 0 10*3/uL (ref 0.0–0.1)
EOS%: 1.3 % (ref 0.0–7.0)
Eosinophils Absolute: 0 10*3/uL (ref 0.0–0.5)
HCT: 35.4 % (ref 34.8–46.6)
HGB: 12 g/dL (ref 11.6–15.9)
LYMPH%: 53.6 % — ABNORMAL HIGH (ref 14.0–49.7)
MCH: 31.6 pg (ref 25.1–34.0)
MCHC: 34 g/dL (ref 31.5–36.0)
MCV: 92.9 fL (ref 79.5–101.0)
MONO#: 0.2 10*3/uL (ref 0.1–0.9)
MONO%: 7.2 % (ref 0.0–14.0)
NEUT#: 1 10*3/uL — ABNORMAL LOW (ref 1.5–6.5)
NEUT%: 37.2 % — ABNORMAL LOW (ref 38.4–76.8)
Platelets: 248 10*3/uL (ref 145–400)
RBC: 3.81 10*6/uL (ref 3.70–5.45)
RDW: 15.3 % — ABNORMAL HIGH (ref 11.2–14.5)
WBC: 2.8 10*3/uL — ABNORMAL LOW (ref 3.9–10.3)
lymph#: 1.5 10*3/uL (ref 0.9–3.3)

## 2015-06-02 LAB — COMPREHENSIVE METABOLIC PANEL
ALT: 19 U/L (ref 0–55)
AST: 19 U/L (ref 5–34)
Albumin: 4.3 g/dL (ref 3.5–5.0)
Alkaline Phosphatase: 51 U/L (ref 40–150)
Anion Gap: 9 mEq/L (ref 3–11)
BUN: 12.2 mg/dL (ref 7.0–26.0)
CO2: 27 mEq/L (ref 22–29)
Calcium: 9.6 mg/dL (ref 8.4–10.4)
Chloride: 103 mEq/L (ref 98–109)
Creatinine: 0.7 mg/dL (ref 0.6–1.1)
EGFR: >90 mL/min/{1.73_m2} (ref >90)
Glucose: 114 mg/dl (ref 70–140)
Potassium: 3.9 mEq/L (ref 3.5–5.1)
Sodium: 139 mEq/L (ref 136–145)
Total Bilirubin: 0.32 mg/dL (ref 0.20–1.20)
Total Protein: 7.4 g/dL (ref 6.4–8.3)

## 2015-06-02 MED ORDER — TBO-FILGRASTIM 300 MCG/0.5ML ~~LOC~~ SOSY
300.0000 ug | PREFILLED_SYRINGE | Freq: Once | SUBCUTANEOUS | Status: AC
  Administered 2015-06-02: 300 ug via SUBCUTANEOUS
  Filled 2015-06-02: qty 0.5

## 2015-06-02 NOTE — Progress Notes (Signed)
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 She had cycle 3 carbo taxol on 05-26-15 with granix given days 3-4-5. ANC is 1.0 today and she will have additional granix now   Patient feels that she has done the best with cycle 3 of any of the treatments thus far. The decadron at 16 mg 12 hrs and 6 hrs prior was more tolerable, she did not need to use compazine or zofran (managed slight nausea with salty food), bowels moved more regularly starting senna day of chemo. She had none of the "excess energy" /or possibly compazine restlessness ~ day 3. Had aches x 24 hrs after each granix, used a couple of doses of ibuprofen which may have been a little helpful, no aches now. She felt well enough to walk 2 miles daily for past week. No bleeding. IV site right wrist still sore, no problems with IV site left hand used for cycle 3. No peripheral neuropathy. Sore area on lateral left toe, has been soaking and using neosporin, ? Insect sting. No SOB. No fever or symptoms of infection. Not markedly fatigued today. Remainder of 10 point Review of Systems negative.  Daughter visited this weekend with mild URI symptoms.   CA 125 1226 on 03-20-2015 No central line Flu vaccine 03-25-15 Genetics testing negative by Breast Ovarian panel by [facility] sent 05-05-15, tho VUS with PALB2.  ONCOLOGIC HISTORY Patient has been generally healthy, post vaginal hysterectomy for fibroids and menorrhagia 2012. In ~ 11-2014 she noticed bladder pressure and occasional trace blood ? Vaginal vs urinary. She was seen by Dr Pilar Jarvis, with cystoscopy 01-31-15 suggesting extrinsic compression of bladder. She had CT AP at [facility] 03-04-14, with moderate ascites,  omental caking in lateral left abdomen and pelvis, absent uterus, "ovaries are likely visualized" , no adenopathy, no hydronephrosis or renal stones, lung bases clear, hemangioma posterior right hepatic lobe (CT report to be scanned into this EMR). CA 125 by Dr Collene Mares 03-06-15 was 957, with CEA 1.0. She had colonoscopy by Dr Collene Mares on 03-12-15, reportedly unremarkable. She had consultation with Dr Denman George on 03-17-15, with plan for initial surgery if amenable to optimal debulking, otherwise to begin with neoadjuvant chemotherapy. CT chest 03-21-15 no findings of concern. She had exploratory laparotomy with BSO, omentectomy and radical R0 debulking on 03-27-15, pathology ([id number]) high grade serous carcinoma involving bilateral tubes and ovaries, omentum and peritoneal nodule, final diagnosis IIIC right fallopian. She began chemotherapy with carboplatin and taxol on 04-14-15. She was neutropenic by day 8 cycle 1, ANC 0.9.   Objective:  Vital signs in last 24 hours:  BP 113/66 mmHg  Pulse 69  Temp(Src) 97.8 F (36.6 C) (Oral)  Resp 18  Ht '5\' 3"'  (1.6 m)  Wt 119 lb 4.8 oz (54.114 kg)  BMI 21.14 kg/m2  SpO2 100% Weight stable Alert, oriented and appropriate. Easily ambulatory.  Complete alopecia  HEENT:PERRL, sclerae not icteric. Oral mucosa moist without lesions, posterior pharynx clear.  Neck supple. No JVD.  Lymphatics:no cervical,supraclavicular or inguinal adenopathy Resp: clear to auscultation bilaterally and normal percussion bilaterally Cardio: regular rate and rhythm. No gallop. GI: soft, nontender, not distended, no mass or organomegaly. Some normal bowel sounds. Surgical incision not remarkable. Musculoskeletal/ Extremities: without pitting edema, cords,  tenderness. Minimal tenderness at site of previous IV access right forearm, no swelling or erythema.  Neuro: no peripheral neuropathy. Otherwise nonfocal. PSYCH appropriate mood and affect Skin left lateral great toe with 0.4 cm irritated area,  possibly insect sting. Skin otherwise without rash, ecchymosis, petechiae   Lab Results:  Results for orders placed or performed in visit on 06/02/15  CBC with Differential  Result Value Ref Range   WBC 2.8 (L) 3.9 - 10.3 10e3/uL   NEUT# 1.0 (L) 1.5 - 6.5 10e3/uL   HGB 12.0 11.6 - 15.9 g/dL   HCT 35.4 34.8 - 46.6 %   Platelets 248 145 - 400 10e3/uL   MCV 92.9 79.5 - 101.0 fL   MCH 31.6 25.1 - 34.0 pg   MCHC 34.0 31.5 - 36.0 g/dL   RBC 3.81 3.70 - 5.45 10e6/uL   RDW 15.3 (H) 11.2 - 14.5 %   lymph# 1.5 0.9 - 3.3 10e3/uL   MONO# 0.2 0.1 - 0.9 10e3/uL   Eosinophils Absolute 0.0 0.0 - 0.5 10e3/uL   Basophils Absolute 0.0 0.0 - 0.1 10e3/uL   NEUT% 37.2 (L) 38.4 - 76.8 %   LYMPH% 53.6 (H) 14.0 - 49.7 %   MONO% 7.2 0.0 - 14.0 %   EOS% 1.3 0.0 - 7.0 %   BASO% 0.7 0.0 - 2.0 %  Comprehensive metabolic panel  Result Value Ref Range   Sodium 139 136 - 145 mEq/L   Potassium 3.9 3.5 - 5.1 mEq/L   Chloride 103 98 - 109 mEq/L   CO2 27 22 - 29 mEq/L   Glucose 114 70 - 140 mg/dl   BUN 12.2 7.0 - 26.0 mg/dL   Creatinine 0.7 0.6 - 1.1 mg/dL   Total Bilirubin 0.32 0.20 - 1.20 mg/dL   Alkaline Phosphatase 51 40 - 150 U/L   AST 19 5 - 34 U/L   ALT 19 0 - 55 U/L   Total Protein 7.4 6.4 - 8.3 g/dL   Albumin 4.3 3.5 - 5.0 g/dL   Calcium 9.6 8.4 - 10.4 mg/dL   Anion Gap 9 3 - 11 mEq/L   EGFR >90 >90 ml/min/1.73 m2   CA125 on 05-22-15     43.9 Will repeat CA 125 day of chemo 06-16-15  Studies/Results:  No results found.  Medications: I have reviewed the patient's current medications. Additional granix (#4) given now If compazine needed she will be aware of any restlessness following doses Keep decadron at 16 mg 12 hrs and 6 hrs prior to taxol.    DISCUSSION Discussed neulasta vs granix for subsequent cycles, patient still prefers granix. Will need granix x4   Patient plans to go to [employer] for a few days between 4-20 and 4-24. She will let us know if particularly fatigued ~  4-19, would repeat labs prior if so. She will also let us know if any concerns prior to coming for lab and chemo on 06-16-15, as could check lab prior to be sure counts ok for treatment if so.    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 Adjuvant carboplatin taxol begun 04-14-15, neutropenic day 8 cycle 2 after 2 doses granix, recovered quickly with third dose. 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 She will have cycle 4 on 5-1 as long as ANC >=1.5 and plt >=100k, granix x 4 afterwards. I will see her 5-15 with lab. 2.restless legs with benadryl, possibly with compazine: benadryl adjusted, follow symptoms if needs  compazine (compazine used with Aloxi) 3.post hysterectomy for benign indications 2012. History of endometriosis. 4.irritable bowel syndrome x years, known to Dr Collene Mares. Would not use probiotics while neutropenic. Will need to use laxatives at least prn with chemo. 5. Flu vaccine 03-25-15 6.heterogeneously dense breast tissue including most recent 3D mammograms at [place] 03-26-15 7. Post cholecystecytomy 8.surgery for crushed left femur age 50 9.slight bulge at upper end of surgical scar:  improved 10.irritation along vein used for #2 chemo improving. Peripheral access still seems adequate. 11.chemo neutropenia: additional granix given today for total 4 doses cycle 3. Will recheck counts later this week if particularly fatigued or other concerns.  12.irritated area on toe: topical neosporin    All questions answered and she knows to call if concerns prior to scheduled visits. Chemo and granix orders placed. Time spent 25 min including >50% counseling and coordination of care.    Avory Rahimi P, MD   06/03/2015, 8:13 AM

## 2015-06-11 ENCOUNTER — Telehealth: Payer: Self-pay | Admitting: Gynecologic Oncology

## 2015-06-11 NOTE — Telephone Encounter (Signed)
Returned call to patient.  Patient left message complaining of pain and swelling at her incision.  Patient reporting right abdomen tenderness and generalized firmness.  She states she notices it more when she is laying down in bed and rolls over.  The pain is mild but uncomfortbable.  Reporting her abdomen feeling more bloated to her this week.  Denies fever or chills.  She is not sure if her symptoms are related to increased activity with the furniture market this week since she started noticing her symptoms more this week.  Bowels and bladder functioning.  Using Senna-kot.  She reports noticing mild redness around the umbilicus as well.  The "egg-like" area to the right of her upper incision "has not gone down" and is "tender to the touch."  She reports significant hot flashes at night as well.  She states today is her last day working at [employer] and she will plan to take it easy for the next several days.  She will contact our office tomorrow with an update and an appointment can be made if needed.  Also advised I could check her incision on Monday when she comes in for treatment.  She verbalizes understanding.  She will call our office sooner if needed.

## 2015-06-12 ENCOUNTER — Telehealth: Payer: Self-pay | Admitting: Gynecologic Oncology

## 2015-06-12 NOTE — Telephone Encounter (Signed)
Returned call to patient.  Patient stating she would like to have me assess her while she is receiving chemo on Monday.  She states the pain is still prevalent.  Advised of Dr. Serita Grit recommendations for an ultrasound of the area.  Patient wanting to make that call when I see her on Monday.  Reportable signs and symptoms reviewed.  She is to call if her symptoms worsen.

## 2015-06-16 ENCOUNTER — Ambulatory Visit (HOSPITAL_BASED_OUTPATIENT_CLINIC_OR_DEPARTMENT_OTHER): Payer: BLUE CROSS/BLUE SHIELD

## 2015-06-16 ENCOUNTER — Other Ambulatory Visit (HOSPITAL_BASED_OUTPATIENT_CLINIC_OR_DEPARTMENT_OTHER): Payer: BLUE CROSS/BLUE SHIELD

## 2015-06-16 ENCOUNTER — Telehealth: Payer: Self-pay | Admitting: *Deleted

## 2015-06-16 DIAGNOSIS — Z5111 Encounter for antineoplastic chemotherapy: Secondary | ICD-10-CM

## 2015-06-16 DIAGNOSIS — Z1379 Encounter for other screening for genetic and chromosomal anomalies: Secondary | ICD-10-CM | POA: Insufficient documentation

## 2015-06-16 DIAGNOSIS — C5701 Malignant neoplasm of right fallopian tube: Secondary | ICD-10-CM

## 2015-06-16 DIAGNOSIS — C762 Malignant neoplasm of abdomen: Secondary | ICD-10-CM

## 2015-06-16 LAB — CBC WITH DIFFERENTIAL/PLATELET
BASO%: 0 % (ref 0.0–2.0)
Basophils Absolute: 0 10*3/uL (ref 0.0–0.1)
EOS%: 0.2 % (ref 0.0–7.0)
Eosinophils Absolute: 0 10*3/uL (ref 0.0–0.5)
HCT: 33.1 % — ABNORMAL LOW (ref 34.8–46.6)
HGB: 11.3 g/dL — ABNORMAL LOW (ref 11.6–15.9)
LYMPH%: 6.6 % — ABNORMAL LOW (ref 14.0–49.7)
MCH: 32 pg (ref 25.1–34.0)
MCHC: 34.1 g/dL (ref 31.5–36.0)
MCV: 93.8 fL (ref 79.5–101.0)
MONO#: 0 10*3/uL — ABNORMAL LOW (ref 0.1–0.9)
MONO%: 0.2 % (ref 0.0–14.0)
NEUT#: 4.8 10*3/uL (ref 1.5–6.5)
NEUT%: 93 % — ABNORMAL HIGH (ref 38.4–76.8)
Platelets: 253 10*3/uL (ref 145–400)
RBC: 3.53 10*6/uL — ABNORMAL LOW (ref 3.70–5.45)
RDW: 15.5 % — ABNORMAL HIGH (ref 11.2–14.5)
WBC: 5.2 10*3/uL (ref 3.9–10.3)
lymph#: 0.3 10*3/uL — ABNORMAL LOW (ref 0.9–3.3)

## 2015-06-16 LAB — COMPREHENSIVE METABOLIC PANEL
ALT: 18 U/L (ref 0–55)
AST: 19 U/L (ref 5–34)
Albumin: 4.4 g/dL (ref 3.5–5.0)
Alkaline Phosphatase: 50 U/L (ref 40–150)
Anion Gap: 10 mEq/L (ref 3–11)
BUN: 13.6 mg/dL (ref 7.0–26.0)
CO2: 23 mEq/L (ref 22–29)
Calcium: 9.5 mg/dL (ref 8.4–10.4)
Chloride: 103 mEq/L (ref 98–109)
Creatinine: 0.7 mg/dL (ref 0.6–1.1)
EGFR: >90 mL/min/{1.73_m2} (ref >90)
Glucose: 157 mg/dl — ABNORMAL HIGH (ref 70–140)
Potassium: 3.8 mEq/L (ref 3.5–5.1)
Sodium: 136 mEq/L (ref 136–145)
Total Bilirubin: 0.47 mg/dL (ref 0.20–1.20)
Total Protein: 7.6 g/dL (ref 6.4–8.3)

## 2015-06-16 MED ORDER — SODIUM CHLORIDE 0.9 % IV SOLN
571.8000 mg | Freq: Once | INTRAVENOUS | Status: AC
  Administered 2015-06-16: 570 mg via INTRAVENOUS
  Filled 2015-06-16: qty 57

## 2015-06-16 MED ORDER — SODIUM CHLORIDE 0.9 % IV SOLN
Freq: Once | INTRAVENOUS | Status: AC
  Administered 2015-06-16: 13:00:00 via INTRAVENOUS

## 2015-06-16 MED ORDER — PALONOSETRON HCL INJECTION 0.25 MG/5ML
INTRAVENOUS | Status: AC
  Filled 2015-06-16: qty 5

## 2015-06-16 MED ORDER — FAMOTIDINE IN NACL 20-0.9 MG/50ML-% IV SOLN
20.0000 mg | Freq: Once | INTRAVENOUS | Status: AC
  Administered 2015-06-16: 20 mg via INTRAVENOUS

## 2015-06-16 MED ORDER — LORAZEPAM 2 MG/ML IJ SOLN
0.5000 mg | Freq: Once | INTRAMUSCULAR | Status: AC | PRN
  Administered 2015-06-16: 0.5 mg via INTRAVENOUS

## 2015-06-16 MED ORDER — DIPHENHYDRAMINE HCL 50 MG/ML IJ SOLN
INTRAMUSCULAR | Status: AC
  Filled 2015-06-16: qty 1

## 2015-06-16 MED ORDER — LORAZEPAM 2 MG/ML IJ SOLN
INTRAMUSCULAR | Status: AC
  Filled 2015-06-16: qty 1

## 2015-06-16 MED ORDER — DEXAMETHASONE SODIUM PHOSPHATE 100 MG/10ML IJ SOLN
20.0000 mg | Freq: Once | INTRAMUSCULAR | Status: AC
  Administered 2015-06-16: 20 mg via INTRAVENOUS
  Filled 2015-06-16: qty 2

## 2015-06-16 MED ORDER — PALONOSETRON HCL INJECTION 0.25 MG/5ML
0.2500 mg | Freq: Once | INTRAVENOUS | Status: AC
  Administered 2015-06-16: 0.25 mg via INTRAVENOUS

## 2015-06-16 MED ORDER — SODIUM CHLORIDE 0.9 % IV SOLN
175.0000 mg/m2 | Freq: Once | INTRAVENOUS | Status: AC
  Administered 2015-06-16: 270 mg via INTRAVENOUS
  Filled 2015-06-16: qty 45

## 2015-06-16 MED ORDER — DIPHENHYDRAMINE HCL 50 MG/ML IJ SOLN
25.0000 mg | Freq: Once | INTRAMUSCULAR | Status: AC
  Administered 2015-06-16: 25 mg via INTRAVENOUS

## 2015-06-16 MED ORDER — FAMOTIDINE IN NACL 20-0.9 MG/50ML-% IV SOLN
INTRAVENOUS | Status: AC
  Filled 2015-06-16: qty 50

## 2015-06-16 NOTE — Telephone Encounter (Signed)
Per pharmacy I and POF I added injection for 5/6. Patietn to receive appt with avs

## 2015-06-16 NOTE — Progress Notes (Signed)
GENETIC TEST RESULT  HPI: Kathryn Ramsey was previously seen in the [place] clinic due to a personal history of cancer of the right fallopian tube, family history of breast, and other cancers, and concerns regarding a hereditary predisposition to cancer. Please refer to our prior cancer genetics clinic note from April 29, 2015 for more information regarding Kathryn Ramsey's medical, social and family histories, and our assessment and recommendations, at the time. Kathryn Ramsey recent genetic test results were disclosed to her, as were recommendations warranted by these results. These results and recommendations are discussed in more detail below.  GENETIC TEST RESULTS: At the time of Kathryn Ramsey's visit on April 29, 2015, we recommended she pursue genetic testing of the 20-gene Breast/Ovarian Cancer Panel with MSH2 Exons 1-7 Inversion Analysis.  The Breast/Ovarian Cancer Panel offered by [facility] Hope Pigeon, MD) includes sequencing and deletion/duplication analysis for the following 19 genes:  ATM, BARD1, BRCA1, BRCA2, BRIP1, CDH1, CHEK2, FANCC, MLH1, MSH2, MSH6, NBN, PALB2, PMS2, PTEN, RAD51C, RAD51D, TP53, and XRCC2.  This panel also includes deletion/duplication analysis (without sequencing) for one gene, EPCAM.  Those results are now back, the report date for which is May 20, 2015.  Genetic testing was normal, and did not reveal a deleterious mutation in these genes.  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  The test report will be scanned into EPIC and will be located under the Results Review tab in the Pathology>Molecular Pathology section.   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 At this time, it is unknown if this VUS is associated with an increased risk for cancer or if this is a normal finding. Since this VUS result is uncertain, it cannot  help guide screening recommendations, and family members should not be tested for this VUS to help define their own cancer risks.  Also, we all have variants within our genes that make [name] unique individuals--most of these variants are benign.  Thus, we treat this VUS as a negative result.   With time, we suspect the lab will reclassify this variant and when they do, we will try to re-contact Kathryn Ramsey to discuss the reclassification further.  We also encouraged Kathryn Ramsey to contact us in a year or two to obtain an update on the status of this VUS.  We discussed with Kathryn Ramsey that since the current genetic testing is not perfect, it is possible there may be a gene mutation in one of these genes that current testing cannot detect, but that chance is small. We also discussed, that it is possible that another gene that has not yet been discovered, or that we have not yet tested, is responsible for the cancer diagnoses in the family, and it is, therefore, important to remain in touch with cancer genetics in the future so that we can continue to offer Kathryn Ramsey the most up to date genetic testing.   CANCER SCREENING RECOMMENDATIONS: This result is reassuring and indicates that Kathryn Ramsey likely does not have an increased risk for a future cancer due to a mutation in one of these genes. This normal test also suggests that Kathryn Ramsey's cancer was most likely not due to an inherited predisposition associated with one of these genes.  Most cancers happen by chance and this negative test suggests that her cancer falls into this category.  We, therefore, recommended she  continue to follow the cancer management and screening guidelines provided by her oncology and primary healthcare providers.   RECOMMENDATIONS FOR FAMILY MEMBERS: Women in this family might be at some increased risk of developing cancer, over the general population risk, simply due to the family history of cancer. We recommended women in this family have a  yearly mammogram beginning at age 17, or 79 years younger than the earliest onset of cancer, an an annual clinical breast exam, and perform monthly breast self-exams. Women in this family should also have a gynecological exam as recommended by their primary provider. All family members should have a colonoscopy by age 48.  FOLLOW-UP: Lastly, we discussed with Kathryn Ramsey that cancer genetics is a rapidly advancing field and it is possible that new genetic tests will be appropriate for her and/or her family members in the future. We encouraged her to remain in contact with cancer genetics on an annual basis so we can update her personal and family histories and let her know of advances in cancer genetics that may benefit this family.   Our contact number was provided. Kathryn Ramsey questions were answered to her satisfaction, and she knows she is welcome to call us at anytime with additional questions or concerns.   Jeanine Luz, MS, [facility] Certified Genetic Counselor [email or web address] Phone: [phone]

## 2015-06-16 NOTE — Patient Instructions (Signed)
[facility] Discharge Instructions for Patients Receiving Chemotherapy  Today you received the following chemotherapy agents Taxol/Carboplatin  To help prevent nausea and vomiting after your treatment, we encourage you to take your nausea medication    If you develop nausea and vomiting that is not controlled by your nausea medication, call the clinic.   BELOW ARE SYMPTOMS THAT SHOULD BE REPORTED IMMEDIATELY:  *FEVER GREATER THAN 100.5 F  *CHILLS WITH OR WITHOUT FEVER  NAUSEA AND VOMITING THAT IS NOT CONTROLLED WITH YOUR NAUSEA MEDICATION  *UNUSUAL SHORTNESS OF BREATH  *UNUSUAL BRUISING OR BLEEDING  TENDERNESS IN MOUTH AND THROAT WITH OR WITHOUT PRESENCE OF ULCERS  *URINARY PROBLEMS  *BOWEL PROBLEMS  UNUSUAL RASH Items with * indicate a potential emergency and should be followed up as soon as possible.  Feel free to call the clinic you have any questions or concerns. The clinic phone number is [phone].  Please show the CHEMO ALERT CARD at check-in to the Emergency Department and triage nurse.   

## 2015-06-17 ENCOUNTER — Telehealth: Payer: Self-pay | Admitting: Gynecologic Oncology

## 2015-06-17 ENCOUNTER — Encounter: Payer: Self-pay | Admitting: Gynecologic Oncology

## 2015-06-17 LAB — CA 125: Cancer Antigen (CA) 125: 26.1 U/mL (ref 0.0–38.1)

## 2015-06-17 NOTE — Progress Notes (Signed)
Patient seen in the chemotherapy room.  Abdomen assessed.  Abdomen soft, non-distended.  Mild tenderness reported with palpation on the right upper aspect of the midline abdominal incision.  No erythema, drainage, or signs of infection noted with the incision.  Patient advised to monitor her abdominal symptoms and tenderness near the incision and call our office.

## 2015-06-17 NOTE — Telephone Encounter (Signed)
Pt advised of CA 125 results.  No concerns voiced.  Advised to call for any needs.

## 2015-06-18 ENCOUNTER — Encounter (HOSPITAL_COMMUNITY): Payer: Self-pay | Admitting: Emergency Medicine

## 2015-06-18 ENCOUNTER — Telehealth: Payer: Self-pay | Admitting: Nurse Practitioner

## 2015-06-18 ENCOUNTER — Emergency Department (HOSPITAL_COMMUNITY): Payer: BLUE CROSS/BLUE SHIELD

## 2015-06-18 ENCOUNTER — Ambulatory Visit (HOSPITAL_BASED_OUTPATIENT_CLINIC_OR_DEPARTMENT_OTHER): Payer: BLUE CROSS/BLUE SHIELD

## 2015-06-18 ENCOUNTER — Encounter (HOSPITAL_COMMUNITY): Payer: Self-pay | Admitting: Radiology

## 2015-06-18 ENCOUNTER — Other Ambulatory Visit: Payer: Self-pay

## 2015-06-18 ENCOUNTER — Telehealth: Payer: Self-pay | Admitting: *Deleted

## 2015-06-18 ENCOUNTER — Emergency Department (HOSPITAL_COMMUNITY)
Admission: EM | Admit: 2015-06-18 | Discharge: 2015-06-18 | Disposition: A | Discharge status: Home / Self Care | Payer: BLUE CROSS/BLUE SHIELD | Attending: Emergency Medicine | Admitting: Emergency Medicine

## 2015-06-18 ENCOUNTER — Ambulatory Visit (HOSPITAL_BASED_OUTPATIENT_CLINIC_OR_DEPARTMENT_OTHER): Payer: BLUE CROSS/BLUE SHIELD | Admitting: Nurse Practitioner

## 2015-06-18 ENCOUNTER — Encounter: Payer: Self-pay | Admitting: Nurse Practitioner

## 2015-06-18 VITALS — BP 122/53 | HR 68 | Temp 97.8°F | Resp 18 | Ht 63.0 in | Wt 120.9 lb

## 2015-06-18 DIAGNOSIS — Z5189 Encounter for other specified aftercare: Secondary | ICD-10-CM

## 2015-06-18 DIAGNOSIS — Z862 Personal history of diseases of the blood and blood-forming organs and certain disorders involving the immune mechanism: Secondary | ICD-10-CM | POA: Diagnosis not present

## 2015-06-18 DIAGNOSIS — Z79899 Other long term (current) drug therapy: Secondary | ICD-10-CM | POA: Insufficient documentation

## 2015-06-18 DIAGNOSIS — R202 Paresthesia of skin: Secondary | ICD-10-CM | POA: Diagnosis not present

## 2015-06-18 DIAGNOSIS — R209 Unspecified disturbances of skin sensation: Secondary | ICD-10-CM

## 2015-06-18 DIAGNOSIS — R200 Anesthesia of skin: Secondary | ICD-10-CM | POA: Diagnosis present

## 2015-06-18 DIAGNOSIS — C5701 Malignant neoplasm of right fallopian tube: Secondary | ICD-10-CM

## 2015-06-18 DIAGNOSIS — J45909 Unspecified asthma, uncomplicated: Secondary | ICD-10-CM | POA: Diagnosis not present

## 2015-06-18 DIAGNOSIS — R011 Cardiac murmur, unspecified: Secondary | ICD-10-CM | POA: Diagnosis not present

## 2015-06-18 DIAGNOSIS — Z5111 Encounter for antineoplastic chemotherapy: Secondary | ICD-10-CM | POA: Diagnosis not present

## 2015-06-18 LAB — COMPREHENSIVE METABOLIC PANEL
ALT: 22 U/L (ref 14–54)
AST: 27 U/L (ref 15–41)
Albumin: 4.7 g/dL (ref 3.5–5.0)
Alkaline Phosphatase: 42 U/L (ref 38–126)
Anion gap: 12 (ref 5–15)
BUN: 17 mg/dL (ref 6–20)
CO2: 26 mmol/L (ref 22–32)
Calcium: 9.6 mg/dL (ref 8.9–10.3)
Chloride: 104 mmol/L (ref 101–111)
Creatinine, Ser: 0.63 mg/dL (ref 0.44–1.00)
GFR calc Af Amer: >60 mL/min (ref >60)
GFR calc non Af Amer: >60 mL/min (ref >60)
Glucose, Bld: 97 mg/dL (ref 65–99)
Potassium: 3.7 mmol/L (ref 3.5–5.1)
Sodium: 142 mmol/L (ref 135–145)
Total Bilirubin: 1.2 mg/dL (ref 0.3–1.2)
Total Protein: 7.3 g/dL (ref 6.5–8.1)

## 2015-06-18 LAB — CBC
HCT: 34 % — ABNORMAL LOW (ref 36.0–46.0)
Hemoglobin: 11.7 g/dL — ABNORMAL LOW (ref 12.0–15.0)
MCH: 32.1 pg (ref 26.0–34.0)
MCHC: 34.4 g/dL (ref 30.0–36.0)
MCV: 93.2 fL (ref 78.0–100.0)
Platelets: 272 10*3/uL (ref 150–400)
RBC: 3.65 MIL/uL — ABNORMAL LOW (ref 3.87–5.11)
RDW: 15.6 % — ABNORMAL HIGH (ref 11.5–15.5)
WBC: 3.6 10*3/uL — ABNORMAL LOW (ref 4.0–10.5)

## 2015-06-18 LAB — DIFFERENTIAL
Basophils Absolute: 0 10*3/uL (ref 0.0–0.1)
Basophils Relative: 0 %
Eosinophils Absolute: 0 10*3/uL (ref 0.0–0.7)
Eosinophils Relative: 0 %
Lymphocytes Relative: 41 %
Lymphs Abs: 1.5 10*3/uL (ref 0.7–4.0)
Monocytes Absolute: 0.1 10*3/uL (ref 0.1–1.0)
Monocytes Relative: 3 %
Neutro Abs: 2 10*3/uL (ref 1.7–7.7)
Neutrophils Relative %: 56 %

## 2015-06-18 MED ORDER — TBO-FILGRASTIM 300 MCG/0.5ML ~~LOC~~ SOSY
300.0000 ug | PREFILLED_SYRINGE | Freq: Once | SUBCUTANEOUS | Status: AC
  Administered 2015-06-18: 300 ug via SUBCUTANEOUS
  Filled 2015-06-18: qty 0.5

## 2015-06-18 NOTE — ED Provider Notes (Signed)
CSN: [id number]     Arrival date & time 06/18/15  1032 History   First MD Initiated Contact with Patient 06/18/15 1121     Chief Complaint  Patient presents with  . facial numbness      (Consider location/radiation/quality/duration/timing/severity/associated sxs/prior Treatment) HPI Holliann Mccartt is a 50 y.o. female with PMH significant for cancer of the right fallopian tube currently undergoing chemotherapy with Carboplatin/Taxol, last treatmetn 06/16/15 who presents with left facial paresthesias.  Patient was seen at cancer center today for this, and sent here for further evaluation.  She reports approximately 8 AM this morning she began experiencing constant left facial paresthesias that radiated down her left neck and into her left shoulder.  She reports last week she did experience similar symptoms, but they only lasted a couple of seconds and resolved.  No modifying or aggravating factors.  No prior treatment.  No chemotherapy regimen changes.  Denies fever, syncope, headache, dizziness, slurred speech, facial droop, unilateral weakness, CP, SOB, N/V/D, abdominal pain, or urinary symptoms.    Past Medical History  Diagnosis Date  . Skin cancer   . Allergy   . Blood transfusion without reported diagnosis     at age 70 years old D/T surgery to femur being crushed  . Complication of anesthesia     hx. 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 Asthma     illness induced asthma  . Anemia   . PONV (postoperative nausea and vomiting)   . Heart murmur     pt, states had a "working heart murmur"   Past Surgical History  Procedure Laterality Date  . Cholecystectomy      2010  . Abdominal hysterectomy      2010  . Sinus surgery 1994    . 3 laporscopic proceedures    . Dilation and curettage of uterus      1997  . Laparoscopy N/A 03/27/2015    Procedure: DIAGNOSTIC LAPAROSCOPY ;  Surgeon: [name], MD;  Location: [facility];  Service: Gynecology;   Laterality: N/A;  . Laparotomy N/A 03/27/2015    Procedure: EXPLORATORY LAPAROTOMY, BILATERAL SALPINGO OOPHORECTOMY, OMENTECTOMY, RADICAL TUMOR DEBULKING;  Surgeon: [name], MD;  Location: [facility];  Service: Gynecology;  Laterality: N/A;   Family History  Problem Relation Age of Onset  . Hypertension Father   . Skin cancer Father     nonmelanoma skin cancers in his late 72s  . Non-Hodgkin's lymphoma Maternal Aunt     dx. 21s; smoker  . Stroke Maternal Grandmother   . Other Mother     benign meningioma dx. early-mid-70s; hysterectomy in her late 51s for heavy periods - still has ovaries  . Other Son     one son with pre-cancerous skin findings  . Other Daughter     cysts on ovaries and hx of heavy periods  . Other Sister     hysterectomy for cysts - still has ovaries  . Heart attack Maternal Grandfather   . Heart attack Paternal Grandfather   . Renal cancer Maternal Aunt 60    smoker  . Breast cancer Maternal Aunt 78  . Other Maternal Aunt     dx. benign brain tumor (meningioma) at 58; 2nd benign brain tumor in her late 31s; hx of radical hysterectomy at age 50  . Brain cancer Other     NOS tumor   Social History  Substance Use Topics  . Smoking status: Never  Smoker   . Smokeless tobacco: Never Used  . Alcohol Use: Yes     Comment: socially   OB History    No data available     Review of Systems All other systems negative unless otherwise stated in HPI    Allergies  Bee venom and Sulfa antibiotics  Home Medications   Prior to Admission medications   Medication Sig Start Date End Date Taking? Authorizing Provider  clidinium-chlordiazePOXIDE (LIBRAX) 5-2.5 MG capsule Take 1 capsule by mouth at bedtime. Reported on 05/22/2015 03/12/15  Yes Historical Provider, MD  dexamethasone (DECADRON) 4 MG tablet Take 4 tablets = 16 mg  with food 12 hrs and 6 hrs prior to Taxol chemotherapy 05/22/15  Yes Lennis Marion Downer, MD  docusate sodium (COLACE) 100 MG capsule Take 100 mg by  mouth 2 (two) times daily.   Yes Historical Provider, MD  HEMOCYTE 324 (106 Fe) MG TABS tablet Take 1 tablet (106 mg of iron total) by mouth daily. On empty stomach. Take with OJ or vitamin C tablet 05/13/15  Yes Lennis Marion Downer, MD  ibuprofen (ADVIL,MOTRIN) 200 MG tablet Take 400 mg by mouth every 8 (eight) hours as needed for mild pain.    Yes Historical Provider, MD  loratadine (CLARITIN) 10 MG tablet Take 10 mg by mouth daily. Begin day of chemotherapy 04/14/15  Yes Historical Provider, MD  LORazepam (ATIVAN) 0.5 MG tablet Take 1 tablet (0.5 mg total) by mouth every 8 (eight) hours as needed for anxiety or sleep. Reported on 04/01/2015 04/01/15  Yes Melissa D Cross, NP  senna (SENOKOT) 8.6 MG TABS tablet Take 2 tablets by mouth at bedtime as needed for mild constipation.   Yes Historical Provider, MD  ondansetron (ZOFRAN) 8 MG tablet Take 1 tablet (8 mg total) by mouth every 8 (eight) hours as needed for nausea or vomiting. May use 72 hrs post chemo Treatment Patient not taking: Reported on 06/18/2015 04/08/15   Lennis Marion Downer, MD  oxyCODONE 10 MG TABS Take 1 tablet (10 mg total) by mouth every 4 (four) hours as needed for breakthrough pain. Patient not taking: Reported on 06/18/2015 03/29/15   Lahoma Crocker, MD  prochlorperazine (COMPAZINE) 10 MG tablet Take 1 tablet (10 mg total) by mouth every 6 (six) hours as needed for nausea or vomiting. Patient not taking: Reported on 06/18/2015 04/08/15   Lennis P Livesay, MD   BP 120/78 mmHg  Pulse 72  Temp(Src) 97.8 F (36.6 C) (Oral)  Resp 13  SpO2 100% Physical Exam  Constitutional: She is oriented to person, place, and time. She appears well-developed and well-nourished.  Non-toxic appearance. She does not have a sickly appearance. She does not appear ill.  HENT:  Head: Normocephalic and atraumatic.  Mouth/Throat: Oropharynx is clear and moist.  Tolerating secretions and maintaining airway without difficulty.  Eyes: Conjunctivae are normal.  Pupils are equal, round, and reactive to light.  Neck: Normal range of motion. Neck supple.  Cardiovascular: Normal rate, regular rhythm and normal heart sounds.   No murmur heard. Pulmonary/Chest: Effort normal and breath sounds normal. No accessory muscle usage or stridor. No respiratory distress. She has no wheezes. She has no rhonchi. She has no rales.  Abdominal: Soft. Bowel sounds are normal. She exhibits no distension. There is no tenderness.  Musculoskeletal: Normal range of motion.  Lymphadenopathy:    She has no cervical adenopathy.  Neurological: She is alert and oriented to person, place, and time.  Mental Status:   AOx3.  Speech clear without dysarthria. Cranial Nerves:  I-not tested  II-PERRLA  III, IV, VI-EOMs intact  V-temporal and masseter strength intact.  Normal sensation to light touch bilaterally in all distributions.  VII-symmetrical facial movements intact, no facial droop  VIII-hearing grossly intact bilaterally  IX, X-gag intact  XI-strength of sternomastoid and trapezius muscles 5/5  XII-tongue midline Motor:   Good muscle bulk and tone  Strength 5/5 bilaterally in upper and lower extremities   Cerebellar--intact RAMs, finger to nose intact bilaterally.    No pronator drift Sensory:  Intact in upper and lower extremities   Skin: Skin is warm and dry.  Psychiatric: She has a normal mood and affect. Her behavior is normal.    ED Course  Procedures (including critical care time) Labs Review Labs Reviewed  CBC - Abnormal; Notable for the following:    WBC 3.6 (*)    RBC 3.65 (*)    Hemoglobin 11.7 (*)    HCT 34.0 (*)    RDW 15.6 (*)    All other components within normal limits  DIFFERENTIAL  COMPREHENSIVE METABOLIC PANEL    Imaging Review Ct Head Wo Contrast  06/18/2015  CLINICAL DATA:  LEFT facial numbness beginning earlier today. Unsteady gait. EXAM: CT HEAD WITHOUT CONTRAST TECHNIQUE: Contiguous axial images were obtained from the base of the  skull through the vertex without intravenous contrast. COMPARISON:  None. FINDINGS: No evidence for acute infarction, hemorrhage, mass lesion, hydrocephalus, or extra-axial fluid. No atrophy or white matter disease. Intact calvarium. No acute sinus or mastoid disease. IMPRESSION: Negative exam. Electronically Signed   By: Staci Righter M.D.   On: 06/18/2015 12:34   Mr Brain Wo Contrast (neuro Protocol)  06/18/2015  CLINICAL DATA:  Left facial numbness beginning this morning. Concern for stroke. EXAM: MRI HEAD WITHOUT CONTRAST TECHNIQUE: Multiplanar, multiecho pulse sequences of the brain and surrounding structures were obtained without intravenous contrast. COMPARISON:  Head CT 06/18/2015 FINDINGS: There is no evidence of acute infarct, intracranial hemorrhage, mass, midline shift, or extra-axial fluid collection. Ventricles and sulci are normal. No significant cerebral white matter disease is seen. Orbits are unremarkable. Bilateral maxillary sinus mucous retention cysts are noted. The mastoid air cells are clear. Major intracranial vascular flow voids are preserved, with the left vertebral artery dominance. Major intracranial vascular flow voids are preserved. Calvarium and scalp soft tissues unremarkable. IMPRESSION: Unremarkable appearance of the brain. Electronically Signed   By: Logan Bores M.D.   On: 06/18/2015 15:58   I have personally reviewed and evaluated these images and lab results as part of my medical decision-making.   EKG Interpretation None     ED ECG REPORT   Date: 06/18/2015  Rate: 65  Rhythm: normal sinus rhythm  QRS Axis: normal  Intervals: normal  ST/T Wave abnormalities: normal  Conduction Disutrbances:none  Narrative Interpretation:   Old EKG Reviewed: none available  I have personally reviewed the EKG tracing and agree with the computerized printout as noted.  MDM  Patient presents with left facial and left neck paraesthesias since 8 AM today.  Normal neurological  exam.  Concern for CVA versus metastasis.  Will obtain CT head, CMP, CBC, and EKG.  Labs without acute abnormalities, WBC 3.6.  Patient receives Granix injections.  EKG shows NSR without acute changes. CT head negative.  Will order MR brain w/out contrast.  Brain MRI normal.  Low suspicion for CVA or TIA or metastasis, negative imaging and normal neurological exam.  Possibly related to chemotherapy.  Plan  to discharge home with PCP follow up as needed.  Discussed return precautions.  Patient agrees and acknowledges the above plan for discharge.  Final diagnoses:  Paresthesia    Case has been discussed with and seen by Dr. Zenia Resides who agrees with the above plan for discharge.   Gloriann Loan, PA-C 06/18/15 1638  Lacretia Leigh, MD 06/20/15 1400

## 2015-06-18 NOTE — ED Notes (Signed)
Per pt, states cancer center sent her here for left facial numbness-stroke work up-states pins and needles on left side of face that started this am-no H/A, weakness, slurred speech or difficulty swallowing

## 2015-06-18 NOTE — Progress Notes (Signed)
SYMPTOM MANAGEMENT CLINIC    Chief Complaint: Facial paresthesia  HPI:  Kathryn Ramsey 50 y.o. female diagnosed with fallopian tube cancer; with abdominal carcinomatosis.  Currently undergoing carboplatin/Taxol chemotherapy therapy regimen.   Patient presents to the [facility] today with complaint of left facial paresthesia.  She states that she developed some transient, mild numbness/tingling to the left side of her face last week; but it quickly resolved.  She states that she awoke this morning at approximate 8 AM with some left facial numbness/tingling; that has since progressed.  She states that the numbness has now progressed to her chin but has not crossed to the right side.  She also states that the numbness/10 when she has radiated to her left shoulder region as well.  She denies any issues with managing her secretions or her airway.  She denies any other new symptoms whatsoever.  She denies any recent fevers or chills.  Brief neurological exam today revealed no neurological deficits whatsoever.  Vital signs were stable and patient was afebrile.  Reviewed all findings with Dr. [name]; and she recommended that patient be transported to the emergency department for further evaluation and management.  Brief history.  Report were called to the emergency department charge nurse; prior to the patient be in transported to the emergency department via wheelchair by the [facility] nurse.  Of note-patient was initially scheduled to receive a Granix injection today; and could return back to the cancer Center this afternoon if cleared per the emergency department.   No history exists.    Review of Systems  Neurological: Positive for tingling and sensory change.  All other systems reviewed and are negative.   Past Medical History  Diagnosis Date  . Skin cancer   . Allergy   . Blood transfusion without reported diagnosis     at age 57 years old D/T surgery to femur being crushed    . Complication of anesthesia     hx. 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 Asthma     illness induced asthma  . Anemia   . PONV (postoperative nausea and vomiting)   . Heart murmur     pt, states had a "working heart murmur"    Past Surgical History  Procedure Laterality Date  . Cholecystectomy      2010  . Abdominal hysterectomy      2010  . Sinus surgery 1994    . 3 laporscopic proceedures    . Dilation and curettage of uterus      1997  . Laparoscopy N/A 03/27/2015    Procedure: DIAGNOSTIC LAPAROSCOPY ;  Surgeon: [name], MD;  Location: [facility];  Service: Gynecology;  Laterality: N/A;  . 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    is allergic to bee venom and sulfa antibiotics.    Medication List       This list is accurate as of: 06/18/15 10:32 AM.  Always use your most recent med list.  acetaminophen 500 MG tablet  Commonly known as:  TYLENOL  Take 500 mg by mouth every 6 (six) hours as needed (Pain). Reported on 05/22/2015     clidinium-chlordiazePOXIDE 5-2.5 MG capsule  Commonly known as:  LIBRAX  Take 1 capsule by mouth at bedtime. Reported on 05/22/2015     dexamethasone 4 MG tablet  Commonly known as:  DECADRON  Take 4 tablets = 16 mg  with food 12 hrs and 6 hrs prior to Taxol chemotherapy     docusate sodium 100 MG capsule  Commonly known as:   COLACE  Take 100 mg by mouth 2 (two) times daily.     HEMOCYTE 324 (106 Fe) MG Tabs tablet  Generic drug:  Ferrous Fumarate  Take 1 tablet (106 mg of iron total) by mouth daily. On empty stomach. Take with OJ or vitamin C tablet     ibuprofen 200 MG tablet  Commonly known as:  ADVIL,MOTRIN  Take 400 mg by mouth every 8 (eight) hours as needed for mild pain.     loratadine 10 MG tablet  Commonly known as:  CLARITIN  Take 10 mg by mouth daily. Begin day of chemotherapy     LORazepam 0.5 MG tablet  Commonly known as:  ATIVAN  Take 1 tablet (0.5 mg total) by mouth every 8 (eight) hours as needed for anxiety or sleep. Reported on 04/01/2015     ondansetron 4 MG tablet  Commonly known as:  ZOFRAN  Take 4 mg by mouth 3 (three) times daily. Reported on 05/22/2015     ondansetron 8 MG tablet  Commonly known as:  ZOFRAN  Take 1 tablet (8 mg total) by mouth every 8 (eight) hours as needed for nausea or vomiting. May use 72 hrs post chemo Treatment     Oxycodone HCl 10 MG Tabs  Take 1 tablet (10 mg total) by mouth every 4 (four) hours as needed for breakthrough pain.     prochlorperazine 10 MG tablet  Commonly known as:  COMPAZINE  Take 1 tablet (10 mg total) by mouth every 6 (six) hours as needed for nausea or vomiting.     senna 8.6 MG Tabs tablet  Commonly known as:  SENOKOT  Take 2 tablets by mouth at bedtime as needed for mild constipation.         PHYSICAL EXAMINATION  Oncology Vitals 06/18/2015 06/18/2015  Height - 160 cm  Weight - 54.84 kg  Weight (lbs) - 120 lbs 14 oz  BMI (kg/m2) - 21.42 kg/m2  Temp 98.1 97.8  Pulse 67 68  Resp 16 18  SpO2 99 100  BSA (m2) - 1.56 m2   BP Readings from Last 2 Encounters:  06/18/15 124/68  06/18/15 122/53    Physical Exam  Constitutional: She is oriented to person, place, and time and well-developed, well-nourished, and in no distress.  HENT:  Head: Normocephalic and atraumatic.  Eyes: Conjunctivae and EOM are normal. Pupils are  equal, round, and reactive to light. Right eye exhibits no discharge. Left eye exhibits no discharge. No scleral icterus.  Neck: Normal range of motion.  Pulmonary/Chest: Effort normal. No respiratory distress.  Musculoskeletal: Normal range of motion.  Neurological: She is alert and oriented to person, place, and time. Gait normal.  Skin: Skin is warm and dry.  Psychiatric:  Patient appears anxious.  Nursing note and vitals reviewed.   LABORATORY DATA:. Appointment on 06/16/2015  Component Date Value Ref Range Status  . WBC 06/16/2015 5.2  3.9 -  10.3 10e3/uL Final  . NEUT# 06/16/2015 4.8  1.5 - 6.5 10e3/uL Final  . HGB 06/16/2015 11.3* 11.6 - 15.9 g/dL Final  . HCT 06/16/2015 33.1* 34.8 - 46.6 % Final  . Platelets 06/16/2015 253  145 - 400 10e3/uL Final  . MCV 06/16/2015 93.8  79.5 - 101.0 fL Final  . MCH 06/16/2015 32.0  25.1 - 34.0 pg Final  . MCHC 06/16/2015 34.1  31.5 - 36.0 g/dL Final  . RBC 06/16/2015 3.53* 3.70 - 5.45 10e6/uL Final  . RDW 06/16/2015 15.5* 11.2 - 14.5 % Final  . lymph# 06/16/2015 0.3* 0.9 - 3.3 10e3/uL Final  . MONO# 06/16/2015 0.0* 0.1 - 0.9 10e3/uL Final  . Eosinophils Absolute 06/16/2015 0.0  0.0 - 0.5 10e3/uL Final  . Basophils Absolute 06/16/2015 0.0  0.0 - 0.1 10e3/uL Final  . NEUT% 06/16/2015 93.0* 38.4 - 76.8 % Final  . LYMPH% 06/16/2015 6.6* 14.0 - 49.7 % Final  . MONO% 06/16/2015 0.2  0.0 - 14.0 % Final  . EOS% 06/16/2015 0.2  0.0 - 7.0 % Final  . BASO% 06/16/2015 0.0  0.0 - 2.0 % Final  . Sodium 06/16/2015 136  136 - 145 mEq/L Final  . Potassium 06/16/2015 3.8  3.5 - 5.1 mEq/L Final  . Chloride 06/16/2015 103  98 - 109 mEq/L Final  . CO2 06/16/2015 23  22 - 29 mEq/L Final  . Glucose 06/16/2015 157* 70 - 140 mg/dl Final   Glucose reference range is for nonfasting patients. Fasting glucose reference range is 70- 100.  [name] BUN 06/16/2015 13.6  7.0 - 26.0 mg/dL Final  . Creatinine 06/16/2015 0.7  0.6 - 1.1 mg/dL Final  . Total Bilirubin 06/16/2015  0.47  0.20 - 1.20 mg/dL Final  . Alkaline Phosphatase 06/16/2015 50  40 - 150 U/L Final  . AST 06/16/2015 19  5 - 34 U/L Final  . ALT 06/16/2015 18  0 - 55 U/L Final  . Total Protein 06/16/2015 7.6  6.4 - 8.3 g/dL Final  . Albumin 06/16/2015 4.4  3.5 - 5.0 g/dL Final  . Calcium 06/16/2015 9.5  8.4 - 10.4 mg/dL Final  . Anion Gap 06/16/2015 10  3 - 11 mEq/L Final  . EGFR 06/16/2015 >90  >90 ml/min/1.73 m2 Final   eGFR is calculated using the CKD-EPI Creatinine Equation (2009)  . Cancer Antigen (CA) 125 06/16/2015 26.1  0.0 - 38.1 U/mL Final   Roche ECLIA methodology    RADIOGRAPHIC STUDIES: No results found.  ASSESSMENT/PLAN:    Facial paresthesia Patient presents to the [facility] today with complaint of left facial paresthesia.  She states that she developed some transient, mild numbness/tingling to the left side of her face last week; but it quickly resolved.  She states that she awoke this morning at approximate 8 AM with some left facial numbness/tingling; that has since progressed.  She states that the numbness has now progressed to her chin but has not crossed to the right side.  She also states that the numbness/10 when she has radiated to her left shoulder region as well.  She denies any issues with managing her secretions or her airway.  She denies any other new symptoms whatsoever.  She denies any recent fevers or chills.  Brief neurological exam today revealed no neurological deficits whatsoever.  Vital signs were stable and patient was afebrile.  Reviewed all findings with Dr. [name]; and she recommended that patient be transported to the emergency department for further evaluation and management.  Brief history.  Report were called to the emergency department charge nurse; prior to the patient be in transported to the emergency department via wheelchair by the [facility] nurse.  Of note-patient was initially scheduled to receive a Granix injection today; and could  return back to the cancer Center this afternoon if cleared per the emergency department.  Cancer of right fallopian tube [facility]) Patient received cycle 4 of her carboplatin/Taxol chemotherapy regimen this past Monday, 06/16/2015.  She was scheduled to initiate 4 days in a row of Granix injections for growth factor support today.  Patient presented to the [facility] today with left facial paresthesia.  She was transported to the emergency department for further evaluation and management.  See further notes for details.  Patient can return to the [facility] today to receive her growth factor injection if she has been cleared by the emergency department today.  Also, patient is scheduled to return for repeat injections on May 4, May 5, and 06/21/2015.  She is scheduled for labs and a follow-up visit on 06/30/2015.   Patient stated understanding of all instructions; and was in agreement with this plan of care. The patient knows to call the clinic with any problems, questions or concerns.   Total time spent with patient was 25 minutes;  with greater than 75 percent of that time spent in face to face counseling regarding patient's symptoms,  and coordination of care and follow up.  Disclaimer:This dictation was prepared with Dragon/digital dictation along with [employer]. Any transcriptional errors that result from this process are unintentional.  Drue Second, NP 06/18/2015

## 2015-06-18 NOTE — Telephone Encounter (Signed)
per pof to sch pt appt-gave pt copy of avs °

## 2015-06-18 NOTE — ED Notes (Signed)
Bed: WA07 Expected date:  Expected time:  Means of arrival:  Comments: Ca ctr pt/[facility]

## 2015-06-18 NOTE — ED Provider Notes (Signed)
Medical screening examination/treatment/procedure(s) were conducted as a shared visit with non-physician practitioner(s) and myself.  I personally evaluated the patient during the encounter.   EKG Interpretation None     Patient here complaining of left-sided facial numbness. On exam she has no focal neurological findings. Brain MRI is negative. Patient stable for discharge  Lacretia Leigh, MD 06/18/15 [phone]

## 2015-06-18 NOTE — ED Notes (Signed)
Off floor for testing 

## 2015-06-18 NOTE — Assessment & Plan Note (Signed)
Patient received cycle 4 of her carboplatin/Taxol chemotherapy regimen this past Monday, 06/16/2015.  She was scheduled to initiate 4 days in a row of Granix injections for growth factor support today.  Patient presented to the [facility] today with left facial paresthesia.  She was transported to the emergency department for further evaluation and management.  See further notes for details.  Patient can return to the [facility] today to receive her growth factor injection if she has been cleared by the emergency department today.  Also, patient is scheduled to return for repeat injections on May 4, May 5, and 06/21/2015.  She is scheduled for labs and a follow-up visit on 06/30/2015.

## 2015-06-18 NOTE — Assessment & Plan Note (Signed)
Patient presents to the [facility] today with complaint of left facial paresthesia.  She states that she developed some transient, mild numbness/tingling to the left side of her face last week; but it quickly resolved.  She states that she awoke this morning at approximate 8 AM with some left facial numbness/tingling; that has since progressed.  She states that the numbness has now progressed to her chin but has not crossed to the right side.  She also states that the numbness/10 when she has radiated to her left shoulder region as well.  She denies any issues with managing her secretions or her airway.  She denies any other new symptoms whatsoever.  She denies any recent fevers or chills.  Brief neurological exam today revealed no neurological deficits whatsoever.  Vital signs were stable and patient was afebrile.  Reviewed all findings with Dr. [name]; and she recommended that patient be transported to the emergency department for further evaluation and management.  Brief history.  Report were called to the emergency department charge nurse; prior to the patient be in transported to the emergency department via wheelchair by the [facility] nurse.  Of note-patient was initially scheduled to receive a Granix injection today; and could return back to the cancer Center this afternoon if cleared per the emergency department.

## 2015-06-18 NOTE — Telephone Encounter (Signed)
Called patient after receipt of page "Having reaction, call ASAP."  Reports "this morning left side of face is numb and it's trickling down my neck.  I have experienced this about a week ago but today is a strange sensation, pretty significant." Speech clear and appropriate.  Denies trouble swallowing or chewing.  Denies numbness and tingling to hands or feet.  Stood up to walk, reports weakness.  Smiling, face is even.  Able to raise arms.  [facility] notified.  Instructed patient to come in now.  P.O.F. Generated.

## 2015-06-18 NOTE — Discharge Instructions (Signed)
Paresthesia Paresthesia is an abnormal burning or prickling sensation. This sensation is generally felt in the hands, arms, legs, or feet. However, it may occur in any part of the body. Usually, it is not painful. The feeling may be described as:  Tingling or numbness.  Pins and needles.  Skin crawling.  Buzzing.  Limbs falling asleep.  Itching. Most people experience temporary (transient) paresthesia at some time in their lives. Paresthesia may occur when you breathe too quickly (hyperventilation). It can also occur without any apparent cause. Commonly, paresthesia occurs when pressure is placed on a nerve. The sensation quickly goes away after the pressure is removed. For some people, however, paresthesia is a long-lasting (chronic) condition that is caused by an underlying disorder. If you continue to have paresthesia, you may need further medical evaluation. HOME CARE INSTRUCTIONS Watch your condition for any changes. Taking the following actions may help to lessen any discomfort that you are feeling:  Avoid drinking alcohol.  Try acupuncture or massage to help relieve your symptoms.  Keep all follow-up visits as directed by your health care provider. This is important. SEEK MEDICAL CARE IF:  You continue to have episodes of paresthesia.  Your burning or prickling feeling gets worse when you walk.  You have pain, cramps, or dizziness.  You develop a rash. SEEK IMMEDIATE MEDICAL CARE IF:  You feel weak.  You have trouble walking or moving.  You have problems with speech, understanding, or vision.  You feel confused.  You cannot control your bladder or bowel movements.  You have numbness after an injury.  You faint.   This information is not intended to replace advice given to you by your health care provider. Make sure you discuss any questions you have with your health care provider.   Document Released: 01/22/2002 Document Revised: 06/18/2014 Document Reviewed:  01/28/2014 Elsevier Interactive Patient Education 2016 [employer]  

## 2015-06-19 ENCOUNTER — Other Ambulatory Visit: Payer: Self-pay | Admitting: Oncology

## 2015-06-19 ENCOUNTER — Encounter: Payer: Self-pay | Admitting: Oncology

## 2015-06-19 ENCOUNTER — Ambulatory Visit (HOSPITAL_BASED_OUTPATIENT_CLINIC_OR_DEPARTMENT_OTHER): Payer: BLUE CROSS/BLUE SHIELD

## 2015-06-19 ENCOUNTER — Ambulatory Visit: Payer: BLUE CROSS/BLUE SHIELD

## 2015-06-19 ENCOUNTER — Ambulatory Visit (HOSPITAL_BASED_OUTPATIENT_CLINIC_OR_DEPARTMENT_OTHER): Payer: BLUE CROSS/BLUE SHIELD | Admitting: Oncology

## 2015-06-19 VITALS — BP 124/77 | HR 84 | Temp 97.8°F

## 2015-06-19 VITALS — BP 98/66 | HR 100 | Ht 63.0 in | Wt 119.1 lb

## 2015-06-19 DIAGNOSIS — D701 Agranulocytosis secondary to cancer chemotherapy: Secondary | ICD-10-CM

## 2015-06-19 DIAGNOSIS — T451X5A Adverse effect of antineoplastic and immunosuppressive drugs, initial encounter: Secondary | ICD-10-CM

## 2015-06-19 DIAGNOSIS — R209 Unspecified disturbances of skin sensation: Secondary | ICD-10-CM | POA: Diagnosis not present

## 2015-06-19 DIAGNOSIS — C762 Malignant neoplasm of abdomen: Secondary | ICD-10-CM

## 2015-06-19 DIAGNOSIS — C5701 Malignant neoplasm of right fallopian tube: Secondary | ICD-10-CM | POA: Diagnosis not present

## 2015-06-19 DIAGNOSIS — Z79899 Other long term (current) drug therapy: Secondary | ICD-10-CM

## 2015-06-19 DIAGNOSIS — R202 Paresthesia of skin: Secondary | ICD-10-CM

## 2015-06-19 MED ORDER — TBO-FILGRASTIM 300 MCG/0.5ML ~~LOC~~ SOSY
300.0000 ug | PREFILLED_SYRINGE | Freq: Once | SUBCUTANEOUS | Status: AC
  Administered 2015-06-19: 300 ug via SUBCUTANEOUS
  Filled 2015-06-19: qty 0.5

## 2015-06-19 NOTE — Telephone Encounter (Signed)
Spoke with Ms. Kathryn Ramsey ~1200. She states that her legs are weak and feel wobbly.  Lower back pain-probably due to  grainx injection. Left facial cheek numb like she had novocain.  She has some nausea. Told her that Dr. [name] wanted to see her today after her grainx injection at 1300.  Ms Robbinson verbalized understanding.

## 2015-06-19 NOTE — Telephone Encounter (Signed)
Call from patient requesting call from colaborative nurse.  "I am still feeling bad.  My legs are really wobbly and unsteady."  Asked what time to come in today for next injection.  Scheduled for 1:00 pm.  Reports ED did a CT Scan and an MRI yesterday.  Return number is [phone].
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
## 2015-06-20 ENCOUNTER — Ambulatory Visit (HOSPITAL_BASED_OUTPATIENT_CLINIC_OR_DEPARTMENT_OTHER): Payer: BLUE CROSS/BLUE SHIELD

## 2015-06-20 VITALS — BP 117/78 | HR 95 | Temp 97.7°F | Resp 18

## 2015-06-20 DIAGNOSIS — C762 Malignant neoplasm of abdomen: Secondary | ICD-10-CM | POA: Diagnosis not present

## 2015-06-20 MED ORDER — TBO-FILGRASTIM 300 MCG/0.5ML ~~LOC~~ SOSY
300.0000 ug | PREFILLED_SYRINGE | Freq: Once | SUBCUTANEOUS | Status: AC
  Administered 2015-06-20: 300 ug via SUBCUTANEOUS
  Filled 2015-06-20: qty 0.5

## 2015-06-20 NOTE — Patient Instructions (Signed)

## 2015-06-21 ENCOUNTER — Ambulatory Visit (HOSPITAL_BASED_OUTPATIENT_CLINIC_OR_DEPARTMENT_OTHER): Payer: BLUE CROSS/BLUE SHIELD

## 2015-06-21 VITALS — BP 126/86 | HR 77 | Temp 98.5°F | Resp 18

## 2015-06-21 DIAGNOSIS — D701 Agranulocytosis secondary to cancer chemotherapy: Secondary | ICD-10-CM | POA: Diagnosis not present

## 2015-06-21 DIAGNOSIS — C762 Malignant neoplasm of abdomen: Secondary | ICD-10-CM

## 2015-06-21 DIAGNOSIS — Z79899 Other long term (current) drug therapy: Secondary | ICD-10-CM | POA: Insufficient documentation

## 2015-06-21 DIAGNOSIS — C5701 Malignant neoplasm of right fallopian tube: Secondary | ICD-10-CM

## 2015-06-21 MED ORDER — TBO-FILGRASTIM 300 MCG/0.5ML ~~LOC~~ SOSY
300.0000 ug | PREFILLED_SYRINGE | Freq: Once | SUBCUTANEOUS | Status: AC
  Administered 2015-06-21: 300 ug via SUBCUTANEOUS

## 2015-06-23 ENCOUNTER — Telehealth: Payer: Self-pay

## 2015-06-23 ENCOUNTER — Telehealth: Payer: Self-pay | Admitting: *Deleted

## 2015-06-23 NOTE — Telephone Encounter (Signed)
Pt called requesting Dr Edwyna Shell look into the situation with her facial nerve. She is still having symptoms. They are a little better, they wax and wane. The L side of her face and neck. It is tight and numb/tingly like novacaine wearing off.  Her next MD visit is 5/15 and next chemo probably 5/22. She has concerns of this worsening with continued chemo. Dr Edwyna Shell informed.

## 2015-06-23 NOTE — Telephone Encounter (Signed)
Call received from patient asking to "leave a message for Dr. Mariana Kaufman nurse about the status of my situation from last week."  Offered help after voicemail received but says "Yes, I do want to leave a message."  Call transferred.

## 2015-06-24 ENCOUNTER — Telehealth: Payer: Self-pay

## 2015-06-24 ENCOUNTER — Telehealth: Payer: Self-pay | Admitting: Oncology

## 2015-06-24 ENCOUNTER — Other Ambulatory Visit: Payer: Self-pay | Admitting: Oncology

## 2015-06-24 ENCOUNTER — Other Ambulatory Visit: Payer: Self-pay

## 2015-06-24 DIAGNOSIS — C5701 Malignant neoplasm of right fallopian tube: Secondary | ICD-10-CM

## 2015-06-24 DIAGNOSIS — R209 Unspecified disturbances of skin sensation: Principal | ICD-10-CM

## 2015-06-24 DIAGNOSIS — R200 Anesthesia of skin: Secondary | ICD-10-CM

## 2015-06-24 DIAGNOSIS — R202 Paresthesia of skin: Secondary | ICD-10-CM

## 2015-06-24 NOTE — Telephone Encounter (Signed)
Medical Oncology  Left facial paresthesias/ numbness continues.  I have spoke directly with [facility] neurology re appointment. First available with Dr Narda Amber, who is correct physician for this problem, presently June 8 at 1300, however patient is also on their high priority wait list if cancellation.  Neurology office to contact patient, and [facility] RN to follow up also.  My note 5-4 routed to Dr Posey Pronto now with this information. Will delay 07-07-15 chemo if needed.  Godfrey Pick, MD

## 2015-06-24 NOTE — Telephone Encounter (Signed)
-----   Message from Gordy Levan, MD sent at 06/24/2015  8:31 AM EDT ----- RN please let patient know that I have made referral to [facility] re left face numbness. They scheduled next available to Dr Narda Amber for June 8 @ 1300, but likely will have cancellations prior and have patient on priority wait list for earlier apt. That office to call patient also about this.   Ask how she is today, be sure she is pushing po fluids.  If possible, please have her contact dentist to be seen this week, to be sure nothing on dental exam related to this. She certainly can also speak with PCP Dr Dema Severin if she would like.   Please put all information above in your phone note.  Thank you!

## 2015-06-24 NOTE — Telephone Encounter (Signed)
Brandy from [facility] called stating pt would prefer [facility] and she knows Dr Jannifer Franklin.  I called [facility] and s/w Inez Catalina in NP referral. She set up an appt for this Thursday at 1030.  I called Ms Helmke and let her know of this appt. She was very Patent attorney. She states she is drinking plenty of fluids. "Until I am floating" I s/w Ms Kale about seeing her dentist. She said she will not see dentist until after she sees Dr Jannifer Franklin and gets his opinion.

## 2015-06-26 ENCOUNTER — Ambulatory Visit (INDEPENDENT_AMBULATORY_CARE_PROVIDER_SITE_OTHER): Payer: BLUE CROSS/BLUE SHIELD | Admitting: Neurology

## 2015-06-26 ENCOUNTER — Encounter: Payer: Self-pay | Admitting: Neurology

## 2015-06-26 VITALS — BP 102/58 | HR 52 | Ht 62.0 in | Wt 118.0 lb

## 2015-06-26 DIAGNOSIS — G610 Guillain-Barre syndrome: Secondary | ICD-10-CM

## 2015-06-26 DIAGNOSIS — G608 Other hereditary and idiopathic neuropathies: Secondary | ICD-10-CM | POA: Insufficient documentation

## 2015-06-26 HISTORY — DX: Other hereditary and idiopathic neuropathies: G60.8

## 2015-06-26 MED ORDER — ALPRAZOLAM 0.5 MG PO TABS: ORAL_TABLET | ORAL | Status: DC
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
## 2015-06-29 ENCOUNTER — Other Ambulatory Visit: Payer: Self-pay | Admitting: Oncology

## 2015-06-30 ENCOUNTER — Other Ambulatory Visit (HOSPITAL_BASED_OUTPATIENT_CLINIC_OR_DEPARTMENT_OTHER): Payer: BLUE CROSS/BLUE SHIELD

## 2015-06-30 ENCOUNTER — Encounter: Payer: Self-pay | Admitting: Oncology

## 2015-06-30 ENCOUNTER — Telehealth: Payer: Self-pay | Admitting: Oncology

## 2015-06-30 ENCOUNTER — Ambulatory Visit (HOSPITAL_BASED_OUTPATIENT_CLINIC_OR_DEPARTMENT_OTHER): Payer: BLUE CROSS/BLUE SHIELD | Admitting: Oncology

## 2015-06-30 VITALS — BP 122/49 | HR 61 | Temp 98.3°F | Resp 18 | Ht 62.0 in | Wt 122.4 lb

## 2015-06-30 DIAGNOSIS — D701 Agranulocytosis secondary to cancer chemotherapy: Secondary | ICD-10-CM

## 2015-06-30 DIAGNOSIS — C5701 Malignant neoplasm of right fallopian tube: Secondary | ICD-10-CM

## 2015-06-30 DIAGNOSIS — T451X5A Adverse effect of antineoplastic and immunosuppressive drugs, initial encounter: Secondary | ICD-10-CM

## 2015-06-30 DIAGNOSIS — C762 Malignant neoplasm of abdomen: Secondary | ICD-10-CM

## 2015-06-30 DIAGNOSIS — R202 Paresthesia of skin: Secondary | ICD-10-CM

## 2015-06-30 DIAGNOSIS — R209 Unspecified disturbances of skin sensation: Secondary | ICD-10-CM | POA: Diagnosis not present

## 2015-06-30 DIAGNOSIS — C482 Malignant neoplasm of peritoneum, unspecified: Secondary | ICD-10-CM

## 2015-06-30 LAB — CBC WITH DIFFERENTIAL/PLATELET
BASO%: 0 % (ref 0.0–2.0)
Basophils Absolute: 0 10*3/uL (ref 0.0–0.1)
EOS%: 1.1 % (ref 0.0–7.0)
Eosinophils Absolute: 0 10*3/uL (ref 0.0–0.5)
HCT: 34.1 % — ABNORMAL LOW (ref 34.8–46.6)
HGB: 11.6 g/dL (ref 11.6–15.9)
LYMPH%: 52.5 % — ABNORMAL HIGH (ref 14.0–49.7)
MCH: 32.4 pg (ref 25.1–34.0)
MCHC: 34 g/dL (ref 31.5–36.0)
MCV: 95.3 fL (ref 79.5–101.0)
MONO#: 0.2 10*3/uL (ref 0.1–0.9)
MONO%: 8.4 % (ref 0.0–14.0)
NEUT#: 1 10*3/uL — ABNORMAL LOW (ref 1.5–6.5)
NEUT%: 38 % — ABNORMAL LOW (ref 38.4–76.8)
Platelets: 208 10*3/uL (ref 145–400)
RBC: 3.58 10*6/uL — ABNORMAL LOW (ref 3.70–5.45)
RDW: 15.6 % — ABNORMAL HIGH (ref 11.2–14.5)
WBC: 2.6 10*3/uL — ABNORMAL LOW (ref 3.9–10.3)
lymph#: 1.4 10*3/uL (ref 0.9–3.3)

## 2015-06-30 LAB — COMPREHENSIVE METABOLIC PANEL
ALT: 17 U/L (ref 0–55)
AST: 18 U/L (ref 5–34)
Albumin: 4.3 g/dL (ref 3.5–5.0)
Alkaline Phosphatase: 47 U/L (ref 40–150)
Anion Gap: 8 mEq/L (ref 3–11)
BUN: 8.6 mg/dL (ref 7.0–26.0)
CO2: 26 mEq/L (ref 22–29)
Calcium: 9.5 mg/dL (ref 8.4–10.4)
Chloride: 107 mEq/L (ref 98–109)
Creatinine: 0.7 mg/dL (ref 0.6–1.1)
EGFR: >90 mL/min/{1.73_m2} (ref >90)
Glucose: 81 mg/dl (ref 70–140)
Potassium: 3.9 mEq/L (ref 3.5–5.1)
Sodium: 141 mEq/L (ref 136–145)
Total Bilirubin: <0.3 mg/dL (ref 0.20–1.20)
Total Protein: 7.2 g/dL (ref 6.4–8.3)

## 2015-06-30 MED ORDER — TBO-FILGRASTIM 300 MCG/0.5ML ~~LOC~~ SOSY
300.0000 ug | PREFILLED_SYRINGE | Freq: Once | SUBCUTANEOUS | Status: AC
  Administered 2015-06-30: 300 ug via SUBCUTANEOUS
  Filled 2015-06-30: qty 0.5

## 2015-06-30 MED ORDER — DEXAMETHASONE 4 MG PO TABS: ORAL_TABLET | ORAL | Status: DC

## 2015-06-30 NOTE — Telephone Encounter (Signed)
appt made and avs printed °

## 2015-06-30 NOTE — Progress Notes (Signed)
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 She required granix x 4 after cycle 4.  She continues evaluation for paresthesias neck/ left face/ jaw which began shortly before cycle 4 chemo. She had evaluation in ED on 06-18-15 with CT and MRI head, not remarkable. She had consultation with Dr Floyde Parkins on 06-26-15, with additional labs sent and MRI C spine planned. Per Dr Jannifer Franklin' note, he feels that these symptoms would be quite rare for chemotherapy related complication. He would consider LP if symptoms progress. Tho some of the lab evaluation is still pending, those that have resulted in EPIC at time of this visit are not abnormal (ANA, B.burgdorfi, HTLV).  (Symptoms did not resolve off of compazine)  Patient reports that sensation left face and jaw are back to normal; she still feels some tingling posterior neck around sides bilaterally, tho this is also improved. She feels that C spine may be a little uncomfortable.  Otherwise she was fatigued last week but is feeling much better now. Nausea is not present, bowels are mostly moving well, no fever, no other symptoms of infection including no dental or sinus symptoms, no SOB, no abdominal or pelvic discomfort, no bleeding, no LE swelling, bladder ok. Energy is better. No numbness hands or feet. Remainder of 10 point Review of Systems   Her 14 yo father has been in [facility], initially in ICU with cardiac problems, has had surgery. She will be back visiting him this week. She is to be chaperone at [employer] trip with daughter weekend of 07-11-15.  CA 125 1226 on 03-20-2015 No central line Flu vaccine 03-25-15 Genetics testing negative by  Breast Ovarian panel by [facility] sent 05-05-15, tho VUS with PALB2.  ONCOLOGIC HISTORY Patient has been generally healthy, post vaginal hysterectomy for fibroids and menorrhagia 2012. In ~ 11-2014 she noticed bladder pressure and occasional trace blood ? Vaginal vs urinary. She was seen by Dr Pilar Jarvis, with cystoscopy 01-31-15 suggesting extrinsic compression of bladder. She had CT AP at [facility] 03-04-14, with moderate ascites, omental caking in lateral left abdomen and pelvis, absent uterus, "ovaries are likely visualized" , no adenopathy, no hydronephrosis or renal stones, lung bases clear, hemangioma posterior right hepatic lobe (CT report to be scanned into this EMR). CA 125 by Dr Collene Mares 03-06-15 was 957, with CEA 1.0. She had colonoscopy by Dr Collene Mares on 03-12-15, reportedly unremarkable. She had consultation with Dr Denman George on 03-17-15, with plan for initial surgery if amenable to optimal debulking, otherwise to begin with neoadjuvant chemotherapy. CT chest 03-21-15 no findings of concern. She had exploratory laparotomy with BSO, omentectomy and radical R0 debulking on 03-27-15, pathology ([id number]) high grade serous carcinoma involving bilateral tubes and ovaries, omentum and peritoneal nodule, final diagnosis IIIC right fallopian. She began chemotherapy with carboplatin and taxol on 04-14-15. She was neutropenic by day 8 cycle 1, ANC 0.9.   Objective:  Vital signs in last 24 hours:  BP 122/49 mmHg  Pulse 61  Temp(Src) 98.3 F (36.8 C) (Oral)  Resp 18  Ht '5\' 2"'  (1.575 m)  Wt 122 lb 6.4 oz (55.52 kg)  BMI 22.38 kg/m2  SpO2 100% Weight up 4.5 lbs.  Alert, oriented and appropriate. Ambulatory  without difficulty.  Complete alopecia  HEENT:PERRL, sclerae not icteric. Oral mucosa moist without lesions, posterior pharynx clear.  Neck supple. No JVD.  Lymphatics:no cervical,supraclavicular or inguinal adenopathy Resp: clear to auscultation bilaterally and normal percussion bilaterally Cardio: regular  rate and rhythm. No gallop. GI: soft, nontender, not distended, no mass or organomegaly. Some bowel sounds. Surgical incision not remarkable. Musculoskeletal/ Extremities: without pitting edema, cords, tenderness Neuro: no peripheral neuropathy hands/ feet. No facial droop. Sensation normal left face.   PSYCH appropriate mood and affect Skin without rash, ecchymosis, petechiae   Lab Results:  Results for orders placed or performed in visit on 06/30/15  CBC with Differential  Result Value Ref Range   WBC 2.6 (L) 3.9 - 10.3 10e3/uL   NEUT# 1.0 (L) 1.5 - 6.5 10e3/uL   HGB 11.6 11.6 - 15.9 g/dL   HCT 34.1 (L) 34.8 - 46.6 %   Platelets 208 145 - 400 10e3/uL   MCV 95.3 79.5 - 101.0 fL   MCH 32.4 25.1 - 34.0 pg   MCHC 34.0 31.5 - 36.0 g/dL   RBC 3.58 (L) 3.70 - 5.45 10e6/uL   RDW 15.6 (H) 11.2 - 14.5 %   lymph# 1.4 0.9 - 3.3 10e3/uL   MONO# 0.2 0.1 - 0.9 10e3/uL   Eosinophils Absolute 0.0 0.0 - 0.5 10e3/uL   Basophils Absolute 0.0 0.0 - 0.1 10e3/uL   NEUT% 38.0 (L) 38.4 - 76.8 %   LYMPH% 52.5 (H) 14.0 - 49.7 %   MONO% 8.4 0.0 - 14.0 %   EOS% 1.1 0.0 - 7.0 %   BASO% 0.0 0.0 - 2.0 %  Comprehensive metabolic panel  Result Value Ref Range   Sodium 141 136 - 145 mEq/L   Potassium 3.9 3.5 - 5.1 mEq/L   Chloride 107 98 - 109 mEq/L   CO2 26 22 - 29 mEq/L   Glucose 81 70 - 140 mg/dl   BUN 8.6 7.0 - 26.0 mg/dL   Creatinine 0.7 0.6 - 1.1 mg/dL   Total Bilirubin <0.30 0.20 - 1.20 mg/dL   Alkaline Phosphatase 47 40 - 150 U/L   AST 18 5 - 34 U/L   ALT 17 0 - 55 U/L   Total Protein 7.2 6.4 - 8.3 g/dL   Albumin 4.3 3.5 - 5.0 g/dL   Calcium 9.5 8.4 - 10.4 mg/dL   Anion Gap 8 3 - 11 mEq/L   EGFR >90 >90 ml/min/1.73 m2     Studies/Results:  No results found.  MRI C spine not showing as scheduled in EMR yet  Medications: I have reviewed the patient's current medications. Refill decadron Granix also today with ANC 1.0 particularly as she will be in hospital with father as noted.  Use  claritin with granix and with neulasta   DISCUSSION Neurology evaluation as above discussed, Dr Jannifer Franklin' help much appreciated.  From standpoint of the fallopian carcinoma, it would be best to continue present chemotherapy regimen with carboplatin and taxol. I have asked patient to follow up with Dr Jannifer Franklin' office later today if she has not heard re C spine MRI appointment. Next chemo is due 07-07-15 tho could be delayed slightly if necessary. I will update Dr Jannifer Franklin by this note.   Discussed timing of gCSF after cycle 5, as she wants to be out of town end of that week. 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 Adjuvant carboplatin taxol begun 04-14-15, neutropenic day 8 cycle 2. 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 Due cycle 5 on 07-07-15 as long as no contraindications from neurologic standpoint and if counts adequate (ANC >=1.5 and plt >=100k). Will change gCSF to neulasta for cycle 5 if insurance allows.  From standpoint of the gyn cancer, we hope to continue present chemo thru cycle 6 if ok with neurology 2. Paresthesias in face and jaw area: first noticed week prior to #4 chemo, then increased after #4 chemo. Evaluation so far not identifying cause, MRI C spine pending, doubt chemo. Appreciate Dr Jannifer Franklin seeing her so promptly.  3.restless legs with benadryl, possibly with compazine: benadryl adjusted, follow symptoms if needs compazine (compazine used with Aloxi) 4.irritable bowel syndrome x years, known to Dr Collene Mares. Would not use probiotics while neutropenic. Will need to use laxatives at least prn with chemo. 5. Flu vaccine 03-25-15 6.heterogeneously dense breast tissue including most  recent 3D mammograms at [place] 03-26-15 7. Post cholecystecytomy 8.surgery for crushed left femur age 40 9.slight bulge at upper end of surgical scar: improved 10. Peripheral access still seems adequate. 11.chemo neutropenia: likely close to recovery but ANC still just 1.0 today and she is spending next few days with elderly father in [place]. She agrees to granix additional dose now. Will try neulasta after cycle 5 if insurance allows. 12..post hysterectomy for benign indications 2012. History of endometriosis.   All questions answered. Patient and husband follow discussion well and are in agreement with recommendations and plans. Granix ordered for today, message to [facility] managed care for neulasta, cycle 5 chemo orders placed.  Cc this note to Dr Jannifer Franklin - if necessary will delay cycle 5, but if ok from neurology standpoint will keep this on schedule 07-07-15. Time spent 30 min including >50% counseling and coordination of care.  Route PCP. Cc Dr Denman George to update.    Kathryn Safi P, MD   06/30/2015, 10:09 AM

## 2015-07-01 ENCOUNTER — Telehealth: Payer: Self-pay | Admitting: Neurology

## 2015-07-01 NOTE — Telephone Encounter (Signed)
I tested the patient with the blood work results, everything is normal, but the perineoplastic antibody panel is still pending, this may take a while to come back.

## 2015-07-02 ENCOUNTER — Other Ambulatory Visit: Payer: Self-pay | Admitting: Oncology

## 2015-07-02 LAB — MULTIPLE MYELOMA PANEL, SERUM
Albumin SerPl Elph-Mcnc: 4.6 g/dL — ABNORMAL HIGH (ref 2.9–4.4)
Albumin/Glob SerPl: 1.5 (ref 0.7–1.7)
Alpha 1: 0.2 g/dL (ref 0.0–0.4)
Alpha2 Glob SerPl Elph-Mcnc: 0.7 g/dL (ref 0.4–1.0)
B-Globulin SerPl Elph-Mcnc: 1.1 g/dL (ref 0.7–1.3)
Gamma Glob SerPl Elph-Mcnc: 1.1 g/dL (ref 0.4–1.8)
Globulin, Total: 3.1 g/dL (ref 2.2–3.9)
IgA/Immunoglobulin A, Serum: 268 mg/dL (ref 87–352)
IgG (Immunoglobin G), Serum: 1069 mg/dL (ref 700–1600)
IgM (Immunoglobulin M), Srm: 105 mg/dL (ref 26–217)
Total Protein: 7.7 g/dL (ref 6.0–8.5)

## 2015-07-02 LAB — PAN-ANCA
ANCA Proteinase 3: <3.5 U/mL (ref 0.0–3.5)
Atypical pANCA: <1:20 {titer}
C-ANCA: <1:20 {titer}
Myeloperoxidase Ab: <9 U/mL (ref 0.0–9.0)
P-ANCA: <1:20 {titer}

## 2015-07-02 LAB — PARANEOPLASTIC PROFILE 1
Neuronal Nuc Ab (Ri), IFA: <1:10 {titer}
Neuronal Nuclear (Hu) Antibody (IB): <1:10 {titer}
Purkinje Cell (Yo) Autoantobodies- IFA: <1:10 {titer}

## 2015-07-02 LAB — ANGIOTENSIN CONVERTING ENZYME: Angio Convert Enzyme: 28 U/L (ref 14–82)

## 2015-07-02 LAB — ANA W/REFLEX: Anti Nuclear Antibody(ANA): NEGATIVE

## 2015-07-02 LAB — HTLV I+II ANTIBODIES, (EIA), BLD: HTLV I/II Ab: NEGATIVE

## 2015-07-02 LAB — B. BURGDORFI ANTIBODIES: Lyme IgG/IgM Ab: <0.91 {ISR} (ref 0.00–0.90)

## 2015-07-03 ENCOUNTER — Telehealth: Payer: Self-pay | Admitting: *Deleted

## 2015-07-03 ENCOUNTER — Ambulatory Visit (HOSPITAL_BASED_OUTPATIENT_CLINIC_OR_DEPARTMENT_OTHER): Payer: BLUE CROSS/BLUE SHIELD | Admitting: Nurse Practitioner

## 2015-07-03 ENCOUNTER — Other Ambulatory Visit: Payer: Self-pay | Admitting: *Deleted

## 2015-07-03 ENCOUNTER — Ambulatory Visit (HOSPITAL_BASED_OUTPATIENT_CLINIC_OR_DEPARTMENT_OTHER): Payer: BLUE CROSS/BLUE SHIELD

## 2015-07-03 ENCOUNTER — Telehealth: Payer: Self-pay | Admitting: Gynecologic Oncology

## 2015-07-03 VITALS — BP 123/66 | HR 70 | Temp 98.0°F | Resp 18 | Ht 62.0 in | Wt 120.3 lb

## 2015-07-03 DIAGNOSIS — C5701 Malignant neoplasm of right fallopian tube: Secondary | ICD-10-CM

## 2015-07-03 DIAGNOSIS — L0390 Cellulitis, unspecified: Secondary | ICD-10-CM | POA: Insufficient documentation

## 2015-07-03 DIAGNOSIS — C762 Malignant neoplasm of abdomen: Secondary | ICD-10-CM

## 2015-07-03 DIAGNOSIS — L03113 Cellulitis of right upper limb: Secondary | ICD-10-CM

## 2015-07-03 LAB — COMPREHENSIVE METABOLIC PANEL
ALT: 18 U/L (ref 0–55)
AST: 18 U/L (ref 5–34)
Albumin: 4.4 g/dL (ref 3.5–5.0)
Alkaline Phosphatase: 62 U/L (ref 40–150)
Anion Gap: 8 mEq/L (ref 3–11)
BUN: 9.6 mg/dL (ref 7.0–26.0)
CO2: 28 mEq/L (ref 22–29)
Calcium: 9.7 mg/dL (ref 8.4–10.4)
Chloride: 105 mEq/L (ref 98–109)
Creatinine: 0.7 mg/dL (ref 0.6–1.1)
EGFR: >90 mL/min/{1.73_m2} (ref >90)
Glucose: 102 mg/dl (ref 70–140)
Potassium: 4.1 mEq/L (ref 3.5–5.1)
Sodium: 141 mEq/L (ref 136–145)
Total Bilirubin: 0.39 mg/dL (ref 0.20–1.20)
Total Protein: 7.6 g/dL (ref 6.4–8.3)

## 2015-07-03 LAB — CBC WITH DIFFERENTIAL/PLATELET
BASO%: 0.2 % (ref 0.0–2.0)
Basophils Absolute: 0 10*3/uL (ref 0.0–0.1)
EOS%: 0.2 % (ref 0.0–7.0)
Eosinophils Absolute: 0 10*3/uL (ref 0.0–0.5)
HCT: 33.7 % — ABNORMAL LOW (ref 34.8–46.6)
HGB: 11.4 g/dL — ABNORMAL LOW (ref 11.6–15.9)
LYMPH%: 41 % (ref 14.0–49.7)
MCH: 32.3 pg (ref 25.1–34.0)
MCHC: 33.8 g/dL (ref 31.5–36.0)
MCV: 95.5 fL (ref 79.5–101.0)
MONO#: 0.4 10*3/uL (ref 0.1–0.9)
MONO%: 7.3 % (ref 0.0–14.0)
NEUT#: 2.5 10*3/uL (ref 1.5–6.5)
NEUT%: 51.3 % (ref 38.4–76.8)
Platelets: 225 10*3/uL (ref 145–400)
RBC: 3.53 10*6/uL — ABNORMAL LOW (ref 3.70–5.45)
RDW: 15.9 % — ABNORMAL HIGH (ref 11.2–14.5)
WBC: 4.8 10*3/uL (ref 3.9–10.3)
lymph#: 2 10*3/uL (ref 0.9–3.3)

## 2015-07-03 MED ORDER — CEPHALEXIN 500 MG PO CAPS: 500.0000 mg | ORAL_CAPSULE | Freq: Four times a day (QID) | ORAL | Status: DC

## 2015-07-03 NOTE — Telephone Encounter (Signed)
This RN spoke with pt per her concerns- and request per Dr Edwyna Shell for pt to be seen in [facility] clinic today.  Appointment times given - URGENT POF sent and noted as scheduled per stated times.

## 2015-07-03 NOTE — Telephone Encounter (Signed)
Pt called and left message about a knot on arm.  Spoke with pt and was informed that yesterday, pt noticed a small knot on right arm - between right wrist and elbow.  Today, the knot has gotten a little bigger, pink color around knot about 2 1/2 " , and tender to touch and slightly swollen;  Right upper arm is fine.  Stated the knot was not from the IV site nor the injection site.  Denied fever.  Pt wanted to know if she should come in to see symptom management provider today.   Message relayed to Dr. [name]. Pt's   Phone    [phone]

## 2015-07-03 NOTE — Telephone Encounter (Signed)
Returned call to patient.  She had left message about knot on her right arm.  She reports noticing a knot between her wrist and elbow that is tender and hard.  States it is bigger than a previous episode in another area after having an infusion.  It is tender to touch and "pink all around."  She last had her infusion on May 1 in the affected arm (right).  It is "not hot but sore to the touch and looks like phlebitis.  Advised patient that Dr. [name] and her RN would be notified for further recommendations.  No fever or chills reported.  Advised she would receive a return phone call with recommendations but advised to call back for any questions or concerns between that time.

## 2015-07-04 ENCOUNTER — Other Ambulatory Visit: Payer: Self-pay | Admitting: Oncology

## 2015-07-04 ENCOUNTER — Telehealth: Payer: Self-pay | Admitting: Oncology

## 2015-07-04 ENCOUNTER — Encounter: Payer: Self-pay | Admitting: Nurse Practitioner

## 2015-07-04 NOTE — Telephone Encounter (Signed)
lvm for pt regarding to May 22 moved to May 23

## 2015-07-04 NOTE — Telephone Encounter (Signed)
MEDICAL ONCOLOGY  MRI C spine scheduled for Sun 07-05-15 Cycle 5 carbo taxol scheduled for 07-06-15  Communication from Dr Jannifer Franklin as follows: "The issue with Ms. Patch is difficult. So far, the blood work has been unremarkable. She will have MRI evaluation of the cervical spine this weekend. 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   Both of the chemotherapy agents used can result in a sensory neuronopathy, affecting the dorsal root ganglia. The carboplatin is probably less likely to do this than the Taxol. If possible, delaying the chemotherapy regimen by a week or so and possibly reducing the Taxol dose may be of some benefit. I am concerned that the issue may progress significantly if the chemotherapy is truly the cause of her symptoms. As you know, these agents can result in a more generalized process affecting the dorsal root ganglia that can result in a severe ataxic sensory neuropathy."   I spoke now with patient by phone. I am not sure that Dr Jannifer Franklin and I will be able to make decision re chemo in time for treatment on 07-06-15 and have recommended that we cancel chemo that day; patient understands and agrees.  She will be out of town from 5-26 thru 5-30. Due to [facility] holiday, I believe that [facility] infusion area is full until at least 6-1, so options for delaying treatment would be ~ 5-22 or ~ 6-1. I have LM for chemo scheduler now.   We could consider carboplatin only for this cycle, tho from standpoint of the gyn malignancy it would be preferable to use carbo + taxane. Not discussed with patient, we could consider taxotere instead of taxol.   Area of possible phlebitis on forearm for which she was seen by symptom  management clinic seems a little better today, using ibuprofen, soaks, keflex.  Patient appreciated call and knows that we will be back in touch with her.  Communication re above sent to Dr Jannifer Franklin now.  Godfrey Pick, MD

## 2015-07-04 NOTE — Assessment & Plan Note (Signed)
Patient states that she received her last chemotherapy via peripheral IV on 06/16/2015.  She has developed an area of firmness and erythema to the right forearm.  She denies any recent fevers or chills.  Exam today reveals a hard knot in the central right forearm; which is nontender.  There is also no warmth to the site.  However, there is some surrounding erythema; with question of trace erythema radiating to her right wrist and hand.  There is no tenderness to the site.  This area is very close to the most recent peripheral IV chemotherapy site.  Most likely, this is a moderate phlebitis to the right forearm; but will also cover with Keflex antibiotics for any cellulitic component.  Patient was advised to elevate her arm above the level of the heart whenever at rest.  She may also try some warm compresses to the site.  She can take intermittent ibuprofen to decrease inflammation.  She was encouraged to continue the antibiotics and complete them as directed.  Patient was last contacted to the emergency department for any worsening symptoms whatsoever.  Will continue to monitor closely.

## 2015-07-04 NOTE — Assessment & Plan Note (Signed)
Patient received cycle 4 of her carboplatin/Taxol chemotherapy on 06/16/2015.  She has received multiple Neupogen injections for growth factor support.  Patient is scheduled for a spinal MRI on 07/06/2015.  She is scheduled for labs and chemotherapy on 07/08/2015.

## 2015-07-04 NOTE — Progress Notes (Signed)
SYMPTOM MANAGEMENT CLINIC    Chief Complaint: Cellulitis versus phlebitis  HPI:  Kathryn Ramsey 50 y.o. 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  Currently undergoing carboplatin/Taxol chemotherapy regimen.   Patient states that she received her last chemotherapy via peripheral IV on 06/16/2015.  She has developed an area of firmness and erythema to the right forearm.  She denies any recent fevers or chills.  Exam today reveals a hard knot in the central right forearm; which is nontender.  There is also no warmth to the site.  However, there is some surrounding erythema; with question of trace erythema radiating to her right wrist and hand.  There is no tenderness to the site.  This area is very close to the most recent peripheral IV chemotherapy site.  Most likely, this is a moderate phlebitis to the right forearm; but will also cover with Keflex antibiotics for any cellulitic component.  Patient was advised to elevate her arm above the level of the heart whenever at rest.  She may also try some warm compresses to the site.  She can take intermittent ibuprofen to decrease inflammation.  She was encouraged to continue the antibiotics and complete them as directed.  Patient was last contacted to the emergency department for any worsening symptoms whatsoever.  Will continue to monitor closely.   No history exists.    Review of Systems  Skin:       Reports right forearm, firm mass with some mild erythema.  All other systems reviewed and are negative.   Past Medical History  Diagnosis Date  . Skin cancer   . Allergy   . Blood transfusion without reported diagnosis     at age 25 years old D/T surgery to femur being crushed  . Complication of anesthesia     hx. 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 Asthma     illness induced asthma  . Anemia   . PONV (postoperative nausea and vomiting)   . Heart murmur     pt, states had a "working  heart murmur"  . 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 Cholecystectomy      2010  . Abdominal hysterectomy      2010  . Sinus surgery 1994    . 3 laporscopic proceedures    . Dilation and curettage of uterus      1997  . Laparoscopy N/A 03/27/2015    Procedure: DIAGNOSTIC LAPAROSCOPY ;  Surgeon: [name], MD;  Location: [facility];  Service: Gynecology;  Laterality: N/A;  . 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    is allergic to bee venom and sulfa antibiotics.    Medication List       This list is accurate as of: 07/03/15 11:59 PM.  Always use your most recent med list.  ALPRAZolam 0.5 MG tablet  Commonly known as:  XANAX  Take 2 tablets approximately 45 minutes prior to the MRI study, take a third tablet if needed.     cephALEXin 500 MG capsule  Commonly known as:  KEFLEX  Take 1 capsule (500 mg total) by mouth 4 (four) times daily.     clidinium-chlordiazePOXIDE 5-2.5 MG capsule  Commonly known as:  LIBRAX  Take 1 capsule by mouth at bedtime as needed. Reported on 06/30/2015     dexamethasone 4 MG tablet  Commonly known as:  DECADRON  Take 4 tablets = 16 mg  with food 12 hrs and 6 hrs prior to Taxol chemotherapy     docusate sodium 100 MG capsule  Commonly known as:   COLACE  Take 100 mg by mouth 2 (two) times daily.     HEMOCYTE 324 (106 Fe) MG Tabs tablet  Generic drug:  Ferrous Fumarate  Take 1 tablet (106 mg of iron total) by mouth daily. On empty stomach. Take with OJ or vitamin C tablet     ibuprofen 200 MG tablet  Commonly known as:  ADVIL,MOTRIN  Take 400 mg by mouth every 8 (eight) hours as needed for mild pain.     loratadine 10 MG tablet  Commonly known as:  CLARITIN  Take 10 mg by mouth daily. Begin day of chemotherapy     LORazepam 0.5 MG tablet  Commonly known as:  ATIVAN  Take 1 tablet (0.5 mg total) by mouth every 8 (eight) hours as needed for anxiety or sleep. Reported on 04/01/2015     ondansetron 8 MG tablet  Commonly known as:  ZOFRAN  Take 1 tablet (8 mg total) by mouth every 8 (eight) hours as needed for nausea or vomiting. May use 72 hrs post chemo Treatment     Oxycodone HCl 10 MG Tabs  Take 1 tablet (10 mg total) by mouth every 4 (four) hours as needed for breakthrough pain.     senna 8.6 MG Tabs tablet  Commonly known as:  SENOKOT  Take 2 tablets by mouth at bedtime as needed for mild constipation.         PHYSICAL EXAMINATION  Oncology Vitals 07/03/2015 06/30/2015  Height 158 cm 158 cm  Weight 54.568 kg 55.52 kg  Weight (lbs) 120 lbs 5 oz 122 lbs 6 oz  BMI (kg/m2) 22 kg/m2 22.39 kg/m2  Temp 98 98.3  Pulse 70 61  Resp 18 18  SpO2 100 100  BSA (m2) 1.55 m2 1.56 m2   BP Readings from Last 2 Encounters:  07/03/15 123/66  06/30/15 122/49    Physical Exam  Constitutional: She is well-developed, well-nourished, and in no distress.  HENT:  Head: Normocephalic and atraumatic.  Eyes: Conjunctivae and EOM are normal. Pupils are equal, round, and reactive to light. Right eye exhibits no discharge. Left eye exhibits no discharge. No scleral icterus.  Neck: Normal range of motion.  Pulmonary/Chest: Effort normal. No respiratory distress.  Musculoskeletal: Normal range of motion. She exhibits no edema or  tenderness.  Skin: Skin is warm and dry. No rash noted. There is erythema. No pallor.  Exam today reveals a hard knot in the central right forearm; which is nontender.  There is also no warmth to the site.  However, there is some surrounding erythema; with question of trace erythema radiating to her right wrist and hand.  There is no tenderness to the site.  This area is very close to the most recent peripheral  IV chemotherapy site.    Psychiatric: Affect normal.  Nursing note and vitals reviewed.   LABORATORY DATA:. Appointment on 07/03/2015  Component Date Value Ref Range Status  . WBC 07/03/2015 4.8  3.9 - 10.3 10e3/uL Final  . NEUT# 07/03/2015 2.5  1.5 - 6.5 10e3/uL Final  . HGB 07/03/2015 11.4* 11.6 - 15.9 g/dL Final  . HCT 07/03/2015 33.7* 34.8 - 46.6 % Final  . Platelets 07/03/2015 225  145 - 400 10e3/uL Final  . MCV 07/03/2015 95.5  79.5 - 101.0 fL Final  . MCH 07/03/2015 32.3  25.1 - 34.0 pg Final  . MCHC 07/03/2015 33.8  31.5 - 36.0 g/dL Final  . RBC 07/03/2015 3.53* 3.70 - 5.45 10e6/uL Final  . RDW 07/03/2015 15.9* 11.2 - 14.5 % Final  . lymph# 07/03/2015 2.0  0.9 - 3.3 10e3/uL Final  . MONO# 07/03/2015 0.4  0.1 - 0.9 10e3/uL Final  . Eosinophils Absolute 07/03/2015 0.0  0.0 - 0.5 10e3/uL Final  . Basophils Absolute 07/03/2015 0.0  0.0 - 0.1 10e3/uL Final  . NEUT% 07/03/2015 51.3  38.4 - 76.8 % Final  . LYMPH% 07/03/2015 41.0  14.0 - 49.7 % Final  . MONO% 07/03/2015 7.3  0.0 - 14.0 % Final  . EOS% 07/03/2015 0.2  0.0 - 7.0 % Final  . BASO% 07/03/2015 0.2  0.0 - 2.0 % Final  . Sodium 07/03/2015 141  136 - 145 mEq/L Final  . Potassium 07/03/2015 4.1  3.5 - 5.1 mEq/L Final  . Chloride 07/03/2015 105  98 - 109 mEq/L Final  . CO2 07/03/2015 28  22 - 29 mEq/L Final  . Glucose 07/03/2015 102  70 - 140 mg/dl Final   Glucose reference range is for nonfasting patients. Fasting glucose reference range is 70- 100.  [name] BUN 07/03/2015 9.6  7.0 - 26.0 mg/dL Final  . Creatinine  07/03/2015 0.7  0.6 - 1.1 mg/dL Final  . Total Bilirubin 07/03/2015 0.39  0.20 - 1.20 mg/dL Final  . Alkaline Phosphatase 07/03/2015 62  40 - 150 U/L Final  . AST 07/03/2015 18  5 - 34 U/L Final  . ALT 07/03/2015 18  0 - 55 U/L Final  . Total Protein 07/03/2015 7.6  6.4 - 8.3 g/dL Final  . Albumin 07/03/2015 4.4  3.5 - 5.0 g/dL Final  . Calcium 07/03/2015 9.7  8.4 - 10.4 mg/dL Final  . Anion Gap 07/03/2015 8  3 - 11 mEq/L Final  . EGFR 07/03/2015 >90  >90 ml/min/1.73 m2 Final   eGFR is calculated using the CKD-EPI Creatinine Equation (2009)   Right forearm:      RADIOGRAPHIC STUDIES: No results found.  ASSESSMENT/PLAN:    Cancer of right fallopian tube [facility] Patient received cycle 4 of her carboplatin/Taxol chemotherapy on 06/16/2015.  She has received multiple Neupogen injections for growth factor support.  Patient is scheduled for a spinal MRI on 07/06/2015.  She is scheduled for labs and chemotherapy on 07/08/2015.  Cellulitis Patient states that she received her last chemotherapy via peripheral IV on 06/16/2015.  She has developed an area of firmness and erythema to the right forearm.  She denies any recent fevers or chills.  Exam today reveals a hard knot in the central right forearm; which is nontender.  There is also no warmth to the site.  However, there is some surrounding erythema; with question of trace erythema radiating to her right wrist and hand.  There is no tenderness to the site.  This area  is very close to the most recent peripheral IV chemotherapy site.  Most likely, this is a moderate phlebitis to the right forearm; but will also cover with Keflex antibiotics for any cellulitic component.  Patient was advised to elevate her arm above the level of the heart whenever at rest.  She may also try some warm compresses to the site.  She can take intermittent ibuprofen to decrease inflammation.  She was encouraged to continue the antibiotics and complete them as  directed.  Patient was last contacted to the emergency department for any worsening symptoms whatsoever.  Will continue to monitor closely.   Patient stated understanding of all instructions; and was in agreement with this plan of care. The patient knows to call the clinic with any problems, questions or concerns.   Total time spent with patient was 25 minutes;  with greater than 75 percent of that time spent in face to face counseling regarding patient's symptoms,  and coordination of care and follow up.  Disclaimer:This dictation was prepared with Dragon/digital dictation along with [employer]. Any transcriptional errors that result from this process are unintentional.  Drue Second, NP 07/04/2015

## 2015-07-06 ENCOUNTER — Ambulatory Visit
Admission: RE | Admit: 2015-07-06 | Discharge: 2015-07-06 | Disposition: A | Discharge status: Home / Self Care | Payer: BLUE CROSS/BLUE SHIELD | Source: Ambulatory Visit | Attending: Neurology | Admitting: Neurology

## 2015-07-06 DIAGNOSIS — G608 Other hereditary and idiopathic neuropathies: Secondary | ICD-10-CM

## 2015-07-06 DIAGNOSIS — G610 Guillain-Barre syndrome: Secondary | ICD-10-CM | POA: Diagnosis not present

## 2015-07-06 MED ORDER — GADOBENATE DIMEGLUMINE 529 MG/ML IV SOLN
10.0000 mL | Freq: Once | INTRAVENOUS | Status: AC | PRN
  Administered 2015-07-06: 10 mL via INTRAVENOUS

## 2015-07-07 ENCOUNTER — Ambulatory Visit: Payer: BLUE CROSS/BLUE SHIELD

## 2015-07-07 ENCOUNTER — Other Ambulatory Visit: Payer: Self-pay | Admitting: Oncology

## 2015-07-07 ENCOUNTER — Other Ambulatory Visit: Payer: BLUE CROSS/BLUE SHIELD

## 2015-07-07 ENCOUNTER — Telehealth: Payer: Self-pay | Admitting: Oncology

## 2015-07-07 ENCOUNTER — Telehealth: Payer: Self-pay | Admitting: *Deleted

## 2015-07-07 ENCOUNTER — Telehealth: Payer: Self-pay | Admitting: Neurology

## 2015-07-07 NOTE — Telephone Encounter (Signed)
   I called patient. The MRI shows a lateral disc herniation, may impinge the right C6 nerve root, but this likely has nothing to do with her current symptomatology. No enhancement of the spinal cord or dura. The patient indicates that there has been some mild improvement since she was seen last week, she still has some slight numbness and tingling in the back of the head and left neck area. She denies any new sensory alterations in the arms or legs. Overall, percent her complaints are relatively mild, will proceed with a scheduled chemotherapy for in the morning.   I have discussed this issue with Dr. [name]. The plans are for chemotherapy tomorrow with carboplatin only. If she does well with the chemotherapy, may proceed with the Taxol later on the next cycle.  MRI cervical 07/06/15:  IMPRESSION: This MRI of the cervical spine with and without contrast shows the following: 1. 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 Although there is no definite nerve root compression, there is encroachment upon the exiting right C6 nerve root. 2. Mild degenerative changes with mild disc bulges and/or posterior longitudinal ligament thickening at C3-C4, C4-C5 and C6-C7. There is no foraminal narrowing at these levels and no nerve root impingement. 3. The spinal cord appears normal. 4. There is a normal enhancement pattern.

## 2015-07-07 NOTE — Telephone Encounter (Signed)
LM for rtn call- Pt has a lab appt for tomorrow, asked pt to request to see [facility] if area on arm is still concerning. Also see note from Dr. [name]- area of phlebitis is improving.

## 2015-07-07 NOTE — Telephone Encounter (Signed)
Medical Oncology  Spoke with patient by phone after discussing results of MRI C spine with Dr Jannifer Franklin. Patient understands that the scan does not show any obvious etiology for the recent new neurologic symptoms, which may in fact be chemo related.  Have explained that taxol is more likely than carboplatin to cause neurologic symptoms, tho these are unusual.  I have recommended that we use carboplatin alone for cycle 5. If she does well, could add weekly taxol at 80 mg/m2 x 3 to cycle 6, and could consider an additional cycle with same weekly taxol if tolerated.   Chemo orders changed. Message to infusion staff.  She will not take steroids tonight. She will keep neulasta appointment.    Patient appreciated call and is in agreement with recommendations and plans.  Godfrey Pick, MD

## 2015-07-07 NOTE — Telephone Encounter (Signed)
Medical Oncology  I spoke directly with Dr Jannifer Franklin re results of MRI C spine done 07-07-15, which did not have findings to explain the neurologic symptoms that she experienced after cycle 4 chemo.  Tho rare, taxane seems most likely etiology. Concern is that the problem could worsen.    Patient is improved, with only minimal residual now per Dr Jannifer Franklin' conversation with her just prior to our discussion now.  Will give carboplatin only for cycle 5 on 07-08-15. May consider adding low dose taxol weekly for cycle 6; could give an additional cycle of the weekly taxol if tolerated.  Dr Jannifer Franklin' help much appreciated.   Godfrey Pick, MD

## 2015-07-08 ENCOUNTER — Encounter: Payer: Self-pay | Admitting: Oncology

## 2015-07-08 ENCOUNTER — Other Ambulatory Visit (HOSPITAL_BASED_OUTPATIENT_CLINIC_OR_DEPARTMENT_OTHER): Payer: BLUE CROSS/BLUE SHIELD

## 2015-07-08 ENCOUNTER — Ambulatory Visit (HOSPITAL_BASED_OUTPATIENT_CLINIC_OR_DEPARTMENT_OTHER): Payer: BLUE CROSS/BLUE SHIELD

## 2015-07-08 ENCOUNTER — Other Ambulatory Visit: Payer: Self-pay | Admitting: Oncology

## 2015-07-08 ENCOUNTER — Telehealth: Payer: Self-pay | Admitting: Oncology

## 2015-07-08 ENCOUNTER — Telehealth: Payer: Self-pay | Admitting: *Deleted

## 2015-07-08 VITALS — BP 132/79 | HR 65 | Temp 98.1°F | Resp 18

## 2015-07-08 DIAGNOSIS — C762 Malignant neoplasm of abdomen: Secondary | ICD-10-CM | POA: Diagnosis not present

## 2015-07-08 DIAGNOSIS — C5701 Malignant neoplasm of right fallopian tube: Secondary | ICD-10-CM

## 2015-07-08 LAB — CBC WITH DIFFERENTIAL/PLATELET
BASO%: 0.6 % (ref 0.0–2.0)
Basophils Absolute: 0 10*3/uL (ref 0.0–0.1)
EOS%: 0.3 % (ref 0.0–7.0)
Eosinophils Absolute: 0 10*3/uL (ref 0.0–0.5)
HCT: 33.1 % — ABNORMAL LOW (ref 34.8–46.6)
HGB: 11.1 g/dL — ABNORMAL LOW (ref 11.6–15.9)
LYMPH%: 49 % (ref 14.0–49.7)
MCH: 32.4 pg (ref 25.1–34.0)
MCHC: 33.5 g/dL (ref 31.5–36.0)
MCV: 96.9 fL (ref 79.5–101.0)
MONO#: 0.3 10*3/uL (ref 0.1–0.9)
MONO%: 11.5 % (ref 0.0–14.0)
NEUT#: 1 10*3/uL — ABNORMAL LOW (ref 1.5–6.5)
NEUT%: 38.6 % (ref 38.4–76.8)
Platelets: 244 10*3/uL (ref 145–400)
RBC: 3.42 10*6/uL — ABNORMAL LOW (ref 3.70–5.45)
RDW: 16.6 % — ABNORMAL HIGH (ref 11.2–14.5)
WBC: 2.5 10*3/uL — ABNORMAL LOW (ref 3.9–10.3)
lymph#: 1.2 10*3/uL (ref 0.9–3.3)

## 2015-07-08 LAB — COMPREHENSIVE METABOLIC PANEL
ALT: 19 U/L (ref 0–55)
AST: 20 U/L (ref 5–34)
Albumin: 4.2 g/dL (ref 3.5–5.0)
Alkaline Phosphatase: 53 U/L (ref 40–150)
Anion Gap: 9 mEq/L (ref 3–11)
BUN: 7.9 mg/dL (ref 7.0–26.0)
CO2: 24 mEq/L (ref 22–29)
Calcium: 9.3 mg/dL (ref 8.4–10.4)
Chloride: 104 mEq/L (ref 98–109)
Creatinine: 0.7 mg/dL (ref 0.6–1.1)
EGFR: >90 mL/min/{1.73_m2} (ref >90)
Glucose: 89 mg/dl (ref 70–140)
Potassium: 3.7 mEq/L (ref 3.5–5.1)
Sodium: 138 mEq/L (ref 136–145)
Total Bilirubin: 0.49 mg/dL (ref 0.20–1.20)
Total Protein: 7.4 g/dL (ref 6.4–8.3)

## 2015-07-08 MED ORDER — TBO-FILGRASTIM 300 MCG/0.5ML ~~LOC~~ SOSY
300.0000 ug | PREFILLED_SYRINGE | Freq: Once | SUBCUTANEOUS | Status: AC
  Administered 2015-07-08: 300 ug via SUBCUTANEOUS
  Filled 2015-07-08: qty 0.5

## 2015-07-08 NOTE — Progress Notes (Signed)
Medical Oncology Notified by infusion RN of counts - thank you.   ANC too low for planned single agent carboplatin today, at 1.0.  Hgb and plt ok and patient afebrile. Neutropenic precautions reviewed by infusion RN.  Have requested granix 300 today and  5-24, orders placed and I have left voice messages for managed care (ES and DC now) as well as high priority EPIC message to managed care now.   Infusion RN will keep patient until hopefully we get approval for the granix very shortly. Will reschedule #5 chemo to late next week or ~ 6-6. Next MD on 6-5. POF done now.  Godfrey Pick, MD

## 2015-07-08 NOTE — Progress Notes (Signed)
Will not treat today due to [place] value of 1.0 today. Pt instructed regarding neutropenic precautions. Patient agreed to have Granix today and Charlena Cross called to stated prior auth for Granix is done.

## 2015-07-08 NOTE — Telephone Encounter (Signed)
Patient would like to discuss cervical MRI before having Chemo treatment tomorrow.

## 2015-07-08 NOTE — Telephone Encounter (Signed)
Please refer to prior telephone note, I have discussed the results with the patient and with Dr. [name].

## 2015-07-08 NOTE — Telephone Encounter (Signed)
Message For: ON CALL          ([phone])     Taken 22-MAY-17 at  4:57PM by CEC Delivered 22-MAY-17 at  7:13PM by CEF to                ------------------------------------------------------------ Caller DR [facility]                 CID [id number]  Patient Kathryn Ramsey, Kathryn Ramsey          Pt's Dr Jannifer Franklin       Area Code 336 Phone# [facility], WANTS TO DISCUSS THE RESULT   BEFORE CHEMO TOMORROW                                Disp:Y/N Y If Y = C/B If No Response In 30minutes ============================================================

## 2015-07-08 NOTE — Telephone Encounter (Signed)
left msg confirming 5/24 apt and added 6/5 apt

## 2015-07-08 NOTE — Patient Instructions (Signed)
Neutropenia Neutropenia is a condition that occurs when the level of a certain type of white blood cell (neutrophil) in your body becomes lower than normal. Neutrophils are made in the bone marrow and fight infections. These cells protect against bacteria and viruses. The fewer neutrophils you have, and the longer your body remains without them, the greater your risk of getting a severe infection becomes. CAUSES  The cause of neutropenia may be hard to determine. However, it is usually due to 3 main problems:   Decreased production of neutrophils. This may be due to:  Certain medicines such as chemotherapy.  Genetic problems.  Cancer.  Radiation treatments.  Vitamin deficiency.  Some pesticides.  Increased destruction of neutrophils. This may be due to:  Overwhelming infections.  Hemolytic anemia. This is when the body destroys its own blood cells.  Chemotherapy.  Neutrophils moving to areas of the body where they cannot fight infections. This may be due to:  Dialysis procedures.  Conditions where the spleen becomes enlarged. Neutrophils are held in the spleen and are not available to the rest of the body.  Overwhelming infections. The neutrophils are held in the area of the infection and are not available to the rest of the body. SYMPTOMS  There are no specific symptoms of neutropenia. The lack of neutrophils can result in an infection, and an infection can cause various problems. DIAGNOSIS  Diagnosis is made by a blood test. A complete blood count is performed. The normal level of neutrophils in human blood differs with age and race. Infants have lower counts than older children and adults. African Americans have lower counts than Caucasians or Asians. The average adult level is 1500 cells/mm3 of blood. Neutrophil counts are interpreted as follows:  Greater than 1000 cells/mm3 gives normal protection against infection.  500 to 1000 cells/mm3 gives an increased risk for  infection.  200 to 500 cells/mm3 is a greater risk for severe infection.  Lower than 200 cells/mm3 is a marked risk of infection. This may require hospitalization and treatment with antibiotic medicines. TREATMENT  Treatment depends on the underlying cause, severity, and presence of infections or symptoms. It also depends on your health. Your caregiver will discuss the treatment plan with you. Mild cases are often easily treated and have a good outcome. Preventative measures may also be started to limit your risk of infections. Treatment can include:  Taking antibiotics.  Stopping medicines that are known to cause neutropenia.  Correcting nutritional deficiencies by eating green vegetables to supply folic acid and taking vitamin B supplements.  Stopping exposure to pesticides if your neutropenia is related to pesticide exposure.  Taking a blood growth factor called sargramostim, pegfilgrastim, or filgrastim if you are undergoing chemotherapy for cancer. This stimulates white blood cell production.  Removal of the spleen if you have Felty's syndrome and have repeated infections. HOME CARE INSTRUCTIONS   Follow your caregiver's instructions about when you need to have blood work done.  Wash your hands often. Make sure others who come in contact with you also wash their hands.  Wash raw fruits and vegetables before eating them. They can carry bacteria and fungi.  Avoid people with colds or spreadable (contagious) diseases (chickenpox, herpes zoster, influenza).  Avoid large crowds.  Avoid construction areas. The dust can release fungus into the air.  Be cautious around children in daycare or school environments.  Take care of your respiratory system by coughing and deep breathing.  Bathe daily.  Protect your skin from cuts and   burns.  Do not work in the garden or with flowers and plants.  Care for the mouth before and after meals by brushing with a soft toothbrush. If you have  mucositis, do not use mouthwash. Mouthwash contains alcohol and can dry out the mouth even more.  Clean the area between the genitals and the anus (perineal area) after urination and bowel movements. Women need to wipe from front to back.  Use a water soluble lubricant during sexual intercourse and practice good hygiene after. Do not have intercourse if you are severely neutropenic. Check with your caregiver for guidelines.  Exercise daily as tolerated.  Avoid people who were vaccinated with a live vaccine in the past 30 days. You should not receive live vaccines (polio, typhoid).  Do not provide direct care for pets. Avoid animal droppings. Do not clean litter boxes and bird cages.  Do not share food utensils.  Do not use tampons, enemas, or rectal suppositories unless directed by your caregiver.  Use an electric razor to remove hair.  Wash your hands after handling magazines, letters, and newspapers. SEEK IMMEDIATE MEDICAL CARE IF:   You have a fever.  You have chills or start to shake.  You feel nauseous or vomit.  You develop mouth sores.  You develop aches and pains.  You have redness and swelling around open wounds.  Your skin is warm to the touch.  You have pus coming from your wounds.  You develop swollen lymph nodes.  You feel weak or fatigued.  You develop red streaks on the skin. MAKE SURE YOU:  Understand these instructions.  Will watch your condition.  Will get help right away if you are not doing well or get worse.   This information is not intended to replace advice given to you by your health care provider. Make sure you discuss any questions you have with your health care provider.   Document Released: 07/24/2001 Document Revised: 04/26/2011 Document Reviewed: 08/14/2014 Elsevier Interactive Patient Education 2016 Else[employer] Inc[employer]  

## 2015-07-09 ENCOUNTER — Ambulatory Visit: Payer: BLUE CROSS/BLUE SHIELD

## 2015-07-09 ENCOUNTER — Telehealth: Payer: Self-pay | Admitting: Oncology

## 2015-07-09 ENCOUNTER — Telehealth: Payer: Self-pay

## 2015-07-09 ENCOUNTER — Ambulatory Visit (HOSPITAL_BASED_OUTPATIENT_CLINIC_OR_DEPARTMENT_OTHER): Payer: BLUE CROSS/BLUE SHIELD

## 2015-07-09 VITALS — BP 117/63 | HR 77 | Temp 98.6°F | Resp 18

## 2015-07-09 DIAGNOSIS — C762 Malignant neoplasm of abdomen: Secondary | ICD-10-CM

## 2015-07-09 LAB — CA 125: Cancer Antigen (CA) 125: 19.9 U/mL (ref 0.0–38.1)

## 2015-07-09 MED ORDER — TBO-FILGRASTIM 300 MCG/0.5ML ~~LOC~~ SOSY
300.0000 ug | PREFILLED_SYRINGE | Freq: Once | SUBCUTANEOUS | Status: AC
  Administered 2015-07-09: 300 ug via SUBCUTANEOUS
  Filled 2015-07-09: qty 0.5

## 2015-07-09 NOTE — Telephone Encounter (Signed)
Spoke with patient to inform her of appt date/time changes. Asked pt to stop by scheduling when she come in to office today to get updated schedule printed

## 2015-07-09 NOTE — Telephone Encounter (Signed)
Told Kathryn Ramsey the results of the CA-125 from 07-08-15 as noted below by Dr. [name]. Gave her revised schedule for lab, visit,  and treatment  With Carboplatin on 07-21-15.

## 2015-07-09 NOTE — Patient Instructions (Signed)

## 2015-07-09 NOTE — Telephone Encounter (Signed)
-----   Message from Gordy Levan, MD sent at 07/09/2015  7:47 AM EDT ----- Labs seen and need follow up: fine to let her know marker 19.9 - she is to come for granix and get schedule today

## 2015-07-10 ENCOUNTER — Ambulatory Visit: Payer: BLUE CROSS/BLUE SHIELD

## 2015-07-18 ENCOUNTER — Telehealth: Payer: Self-pay | Admitting: Neurology

## 2015-07-18 MED ORDER — GABAPENTIN 100 MG PO CAPS: 100.0000 mg | ORAL_CAPSULE | Freq: Three times a day (TID) | ORAL | Status: DC

## 2015-07-18 NOTE — Telephone Encounter (Signed)
I called the patient. She indicates that she continues to have some tingling sensations in the back of the head, angle of the jaw on the left. She is having more prominent issues with achy pains in the medial scapular area on the left, this was present initially, but has become a bit more prominent as times gone on. She also reports increased pain when she gets cold. 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 I will place her on low-dose gabapentin 100 mg 3 times daily. She will contact me if she is not tolerating the medication or she requires an upward dose adjustment.

## 2015-07-20 ENCOUNTER — Other Ambulatory Visit: Payer: Self-pay | Admitting: Oncology

## 2015-07-21 ENCOUNTER — Ambulatory Visit (HOSPITAL_BASED_OUTPATIENT_CLINIC_OR_DEPARTMENT_OTHER): Payer: BLUE CROSS/BLUE SHIELD | Admitting: Oncology

## 2015-07-21 ENCOUNTER — Telehealth: Payer: Self-pay | Admitting: Oncology

## 2015-07-21 ENCOUNTER — Other Ambulatory Visit (HOSPITAL_BASED_OUTPATIENT_CLINIC_OR_DEPARTMENT_OTHER): Payer: BLUE CROSS/BLUE SHIELD

## 2015-07-21 ENCOUNTER — Ambulatory Visit (HOSPITAL_BASED_OUTPATIENT_CLINIC_OR_DEPARTMENT_OTHER): Payer: BLUE CROSS/BLUE SHIELD

## 2015-07-21 ENCOUNTER — Encounter: Payer: Self-pay | Admitting: Oncology

## 2015-07-21 VITALS — BP 115/70 | HR 70 | Temp 97.9°F | Resp 18 | Wt 120.7 lb

## 2015-07-21 DIAGNOSIS — C5701 Malignant neoplasm of right fallopian tube: Secondary | ICD-10-CM

## 2015-07-21 DIAGNOSIS — R202 Paresthesia of skin: Secondary | ICD-10-CM

## 2015-07-21 DIAGNOSIS — C762 Malignant neoplasm of abdomen: Secondary | ICD-10-CM

## 2015-07-21 DIAGNOSIS — D701 Agranulocytosis secondary to cancer chemotherapy: Secondary | ICD-10-CM

## 2015-07-21 DIAGNOSIS — Z9889 Other specified postprocedural states: Secondary | ICD-10-CM

## 2015-07-21 DIAGNOSIS — Z79899 Other long term (current) drug therapy: Secondary | ICD-10-CM

## 2015-07-21 DIAGNOSIS — T451X5A Adverse effect of antineoplastic and immunosuppressive drugs, initial encounter: Secondary | ICD-10-CM

## 2015-07-21 DIAGNOSIS — C482 Malignant neoplasm of peritoneum, unspecified: Secondary | ICD-10-CM

## 2015-07-21 DIAGNOSIS — Z5111 Encounter for antineoplastic chemotherapy: Secondary | ICD-10-CM | POA: Diagnosis not present

## 2015-07-21 DIAGNOSIS — R112 Nausea with vomiting, unspecified: Secondary | ICD-10-CM

## 2015-07-21 DIAGNOSIS — R209 Unspecified disturbances of skin sensation: Secondary | ICD-10-CM

## 2015-07-21 LAB — CBC WITH DIFFERENTIAL/PLATELET
BASO%: 0.3 % (ref 0.0–2.0)
Basophils Absolute: 0 10*3/uL (ref 0.0–0.1)
EOS%: 0.7 % (ref 0.0–7.0)
Eosinophils Absolute: 0 10*3/uL (ref 0.0–0.5)
HCT: 39.2 % (ref 34.8–46.6)
HGB: 13.3 g/dL (ref 11.6–15.9)
LYMPH%: 45.2 % (ref 14.0–49.7)
MCH: 33.1 pg (ref 25.1–34.0)
MCHC: 33.9 g/dL (ref 31.5–36.0)
MCV: 97.5 fL (ref 79.5–101.0)
MONO#: 0.2 10*3/uL (ref 0.1–0.9)
MONO%: 6.6 % (ref 0.0–14.0)
NEUT#: 1.4 10*3/uL — ABNORMAL LOW (ref 1.5–6.5)
NEUT%: 47.2 % (ref 38.4–76.8)
Platelets: 272 10*3/uL (ref 145–400)
RBC: 4.02 10*6/uL (ref 3.70–5.45)
RDW: 14.3 % (ref 11.2–14.5)
WBC: 3 10*3/uL — ABNORMAL LOW (ref 3.9–10.3)
lymph#: 1.4 10*3/uL (ref 0.9–3.3)

## 2015-07-21 LAB — COMPREHENSIVE METABOLIC PANEL
ALT: 19 U/L (ref 0–55)
AST: 20 U/L (ref 5–34)
Albumin: 4.7 g/dL (ref 3.5–5.0)
Alkaline Phosphatase: 56 U/L (ref 40–150)
Anion Gap: 10 mEq/L (ref 3–11)
BUN: 12.8 mg/dL (ref 7.0–26.0)
CO2: 27 mEq/L (ref 22–29)
Calcium: 10.3 mg/dL (ref 8.4–10.4)
Chloride: 103 mEq/L (ref 98–109)
Creatinine: 0.8 mg/dL (ref 0.6–1.1)
EGFR: >90 mL/min/{1.73_m2} (ref >90)
Glucose: 87 mg/dl (ref 70–140)
Potassium: 4.6 mEq/L (ref 3.5–5.1)
Sodium: 140 mEq/L (ref 136–145)
Total Bilirubin: 0.71 mg/dL (ref 0.20–1.20)
Total Protein: 8.5 g/dL — ABNORMAL HIGH (ref 6.4–8.3)

## 2015-07-21 MED ORDER — SODIUM CHLORIDE 0.9 % IV SOLN
567.0000 mg | Freq: Once | INTRAVENOUS | Status: AC
  Administered 2015-07-21: 570 mg via INTRAVENOUS
  Filled 2015-07-21: qty 57

## 2015-07-21 MED ORDER — PALONOSETRON HCL INJECTION 0.25 MG/5ML
0.2500 mg | Freq: Once | INTRAVENOUS | Status: AC
  Administered 2015-07-21: 0.25 mg via INTRAVENOUS

## 2015-07-21 MED ORDER — LORAZEPAM 0.5 MG PO TABS: 0.5000 mg | ORAL_TABLET | Freq: Three times a day (TID) | ORAL | Status: DC | PRN

## 2015-07-21 MED ORDER — SODIUM CHLORIDE 0.9 % IV SOLN
10.0000 mg | Freq: Once | INTRAVENOUS | Status: AC
  Administered 2015-07-21: 10 mg via INTRAVENOUS
  Filled 2015-07-21: qty 1

## 2015-07-21 MED ORDER — SODIUM CHLORIDE 0.9 % IV SOLN
Freq: Once | INTRAVENOUS | Status: AC
  Administered 2015-07-21: 13:00:00 via INTRAVENOUS

## 2015-07-21 MED ORDER — LORAZEPAM 2 MG/ML IJ SOLN
0.5000 mg | Freq: Once | INTRAMUSCULAR | Status: AC | PRN
  Administered 2015-07-21: 0.5 mg via INTRAVENOUS

## 2015-07-21 MED ORDER — PALONOSETRON HCL INJECTION 0.25 MG/5ML
INTRAVENOUS | Status: AC
  Filled 2015-07-21: qty 5

## 2015-07-21 MED ORDER — LORAZEPAM 2 MG/ML IJ SOLN
INTRAMUSCULAR | Status: AC
  Filled 2015-07-21: qty 1

## 2015-07-21 NOTE — Progress Notes (Signed)
Okay to treat today, despite Neut = 1.4, per Dr. [name].

## 2015-07-21 NOTE — Progress Notes (Signed)
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 Last chemo was carbo taxol on 06-16-15, with 7 doses of granix needed for neutropenia following that treatment.  She has had extensive evaluation in ED and by Dr Jannifer Franklin for neuropathy involving jaw, left face, left shoulder and left upper arm, which began week prior to cycle 4 chemo and worsened by 06-18-15. Definitive etiology has not been determined; symptoms have gradually improved. Presently she notices slight discomfort at medial lower left scapula and some slight abnormal sensation at left jaw. She has not begun gabapentin, prescribed by Dr Jannifer Franklin.   Massage at [facility] today helped the discomfort medial to left scapula, not fully resolved.   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 She occasionally feels stiff in general and achy in shins, improves when she does exercise walking. Appetite is good without nausea. Bowels have been moving well. She denies bleeding, fever, clear symptoms of infection, SOB, abdominal or pelvic discomfort, LE swelling.  Remainder of 10 point Review of Systems negative.    CA 125 1226 on 03-20-2015 No central line Flu vaccine 03-25-15 Genetics testing negative by Breast Ovarian panel by [facility] sent 05-05-15, tho VUS with PALB2.  ONCOLOGIC HISTORY Patient has been generally healthy, post vaginal hysterectomy for fibroids and menorrhagia 2012. In ~ 11-2014 she noticed bladder pressure and occasional trace blood ? Vaginal vs urinary. She was seen by Dr  Pilar Jarvis, with cystoscopy 01-31-15 suggesting extrinsic compression of bladder. She had CT AP at [facility] 03-04-14, with moderate ascites, omental caking in lateral left abdomen and pelvis, absent uterus, "ovaries are likely visualized" , no adenopathy, no hydronephrosis or renal stones, lung bases clear, hemangioma posterior right hepatic lobe (CT report to be scanned into this EMR). CA 125 by Dr Collene Mares 03-06-15 was 957, with CEA 1.0. She had colonoscopy by Dr Collene Mares on 03-12-15, reportedly unremarkable. She had consultation with Dr Denman George on 03-17-15, with plan for initial surgery if amenable to optimal debulking, otherwise to begin with neoadjuvant chemotherapy. CT chest 03-21-15 no findings of concern. She had exploratory laparotomy with BSO, omentectomy and radical R0 debulking on 03-27-15, pathology ([id number]) high grade serous carcinoma involving bilateral tubes and ovaries, omentum and peritoneal nodule, final diagnosis IIIC right fallopian. She began chemotherapy with carboplatin and taxol on 04-14-15. She was neutropenic by day 8 cycle 1, ANC 0.9. Cycle 5 was delayed with unusual neuropathy symptoms and cytopenias.    Objective:  Vital signs in last 24 hours:  BP 115/70 mmHg  Pulse 70  Temp(Src) 97.9 F (36.6 C) (Oral)  Resp 18  Wt 120 lb 11.2 oz (54.749 kg)  SpO2 99% Weight down 2 lbs. Looks comfortable. Respirations not labored.  Alert, oriented and appropriate. Easily mobile and ambulatory without difficulty.  Alopecia  HEENT:PERRL, sclerae not icteric. Oral mucosa moist without lesions, posterior pharynx clear. Opens and closes jaw easily.  Neck supple. No JVD.  Lymphatics:no cervical,supraclavicular, axillary or inguinal adenopathy Resp: clear to auscultation bilaterally and normal percussion bilaterally Cardio: regular rate and rhythm. No gallop. GI: soft, nontender, not distended, no mass or organomegaly. Some  bowel sounds. Surgical incision not remarkable. Musculoskeletal/  Extremities: UE/LE without pitting edema, cords, tenderness. Spine not tender.  Neuro: no peripheral neuropathy hands or feet. Psych appropriate mood and affect. Skin without rash, ecchymosis, petechiae including left back.    Lab Results:  Results for orders placed or performed in visit on 07/21/15  CBC with Differential  Result Value Ref Range   WBC 3.0 (L) 3.9 - 10.3 10e3/uL   NEUT# 1.4 (L) 1.5 - 6.5 10e3/uL   HGB 13.3 11.6 - 15.9 g/dL   HCT 39.2 34.8 - 46.6 %   Platelets 272 145 - 400 10e3/uL   MCV 97.5 79.5 - 101.0 fL   MCH 33.1 25.1 - 34.0 pg   MCHC 33.9 31.5 - 36.0 g/dL   RBC 4.02 3.70 - 5.45 10e6/uL   RDW 14.3 11.2 - 14.5 %   lymph# 1.4 0.9 - 3.3 10e3/uL   MONO# 0.2 0.1 - 0.9 10e3/uL   Eosinophils Absolute 0.0 0.0 - 0.5 10e3/uL   Basophils Absolute 0.0 0.0 - 0.1 10e3/uL   NEUT% 47.2 38.4 - 76.8 %   LYMPH% 45.2 14.0 - 49.7 %   MONO% 6.6 0.0 - 14.0 %   EOS% 0.7 0.0 - 7.0 %   BASO% 0.3 0.0 - 2.0 %  Comprehensive metabolic panel  Result Value Ref Range   Sodium 140 136 - 145 mEq/L   Potassium 4.6 3.5 - 5.1 mEq/L   Chloride 103 98 - 109 mEq/L   CO2 27 22 - 29 mEq/L   Glucose 87 70 - 140 mg/dl   BUN 12.8 7.0 - 26.0 mg/dL   Creatinine 0.8 0.6 - 1.1 mg/dL   Total Bilirubin 0.71 0.20 - 1.20 mg/dL   Alkaline Phosphatase 56 40 - 150 U/L   AST 20 5 - 34 U/L   ALT 19 0 - 55 U/L   Total Protein 8.5 (H) 6.4 - 8.3 g/dL   Albumin 4.7 3.5 - 5.0 g/dL   Calcium 10.3 8.4 - 10.4 mg/dL   Anion Gap 10 3 - 11 mEq/L   EGFR >90 >90 ml/min/1.73 m2    As cycle 5 has already been delayed 2 weeks, as we plan carboplatin only this cycle and as she will have neulasta on day 3, will proceed with chemo as planned today with counts as above.   CA 125 on 07-08-15 down to 19.9  Studies/Results: Paraneoplastic profile labs by Dr Jannifer Franklin all resulted negative, as did remainder of lab evaluation for the neuropathy.  Medications: I have reviewed the patient's current medications. Neulasta to be  given on day 3, 07-23-15. She has not begun gabapentin, prescribed 100 mg tid by Dr Jannifer Franklin. Patient aware that this may make her drowsy. She will try gabapentin beginning a couple of days after chemo today.  DISCUSSION Patient and husband are in agreement with recommendation for carboplatin single agent today, as this drug is thought less likely to cause neurologic effects than is taxol. She will have neulasta day 3, which may cause aches but hopefully will be more effective in maintaining WBC than the multiple doses of granix used previously. If she tolerates this treatment, I would consider adding taxol as low weekly doses for cycle 6. We could consider an additional 3 weeks of taxol beyond cycle 6 if tolerated, as this agent will be omitted cycle 5 today.  Patient and husband understand that the combination of carboplatin and taxane is best adjuvant treatment for this high risk gyn cancer. Patient is in  agreement with proceeding with this plan for treatment.     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 Adjuvant carboplatin taxol begun 04-14-15, neutropenic day 8 cycle 2. 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 Cycle 5 delayed x 2 weeks with neurologic concerns and cytopenias, to be given today. Will change gCSF to neulasta for cycle 5. From standpoint of the gyn cancer, we hope to continue present chemo thru cycle 6 if ok with neurology 2. Paresthesias in face and jaw, left shoulder/ LUE: first noticed week prior to #4 chemo, then increased after #4 chemo. Extensive evaluation by neurology has not identified cause, tho this may be taxol related. Plan chemo with single agent carboplatin this cycle, then may add low dose weekly taxol. Dr Jannifer Franklin following. 3.chemo neutropenia: prolonged after cycle 4, given total 7 granix. Will use neulasta day 3 cycle 5.  4.irritable bowel syndrome x years, known to Dr Collene Mares. Would not use probiotics while  neutropenic. Use laxatives at least prn with chemo. 5. Peripheral IV access still adequate 6.heterogeneously dense breast tissue including most recent 3D mammograms at [place] 03-26-15 7. Post cholecystecytomy 8.surgery for crushed left femur age 10 9. post hysterectomy for benign indications 2012. History of endometriosis.   All questions answered and she knows to call if needed prior to next scheduled visit. Chemo and neulasta orders confirmed, managed care notified of neulasta. Time spent 30 min including >50% counseling and coordination of care. Route PCP, cc Dr Jannifer Franklin and Dr Denman George.    Marki Frede P, MD   07/22/2015, 4:02 PM

## 2015-07-21 NOTE — Patient Instructions (Signed)
[facility] Discharge Instructions for Patients Receiving Chemotherapy  Today you received the following chemotherapy agents Carboplatin  To help prevent nausea and vomiting after your treatment, we encourage you to take your nausea medication   If you develop nausea and vomiting that is not controlled by your nausea medication, call the clinic.   BELOW ARE SYMPTOMS THAT SHOULD BE REPORTED IMMEDIATELY:  *FEVER GREATER THAN 100.5 F  *CHILLS WITH OR WITHOUT FEVER  NAUSEA AND VOMITING THAT IS NOT CONTROLLED WITH YOUR NAUSEA MEDICATION  *UNUSUAL SHORTNESS OF BREATH  *UNUSUAL BRUISING OR BLEEDING  TENDERNESS IN MOUTH AND THROAT WITH OR WITHOUT PRESENCE OF ULCERS  *URINARY PROBLEMS  *BOWEL PROBLEMS  UNUSUAL RASH Items with * indicate a potential emergency and should be followed up as soon as possible.  Feel free to call the clinic you have any questions or concerns. The clinic phone number is [phone].  Please show the CHEMO ALERT CARD at check-in to the Emergency Department and triage nurse.   

## 2015-07-21 NOTE — Telephone Encounter (Signed)
appt made and avs printed °

## 2015-07-23 ENCOUNTER — Ambulatory Visit (HOSPITAL_BASED_OUTPATIENT_CLINIC_OR_DEPARTMENT_OTHER): Payer: BLUE CROSS/BLUE SHIELD

## 2015-07-23 ENCOUNTER — Telehealth: Payer: Self-pay

## 2015-07-23 VITALS — BP 110/64 | HR 70 | Temp 98.0°F | Resp 18

## 2015-07-23 DIAGNOSIS — C5701 Malignant neoplasm of right fallopian tube: Secondary | ICD-10-CM | POA: Diagnosis not present

## 2015-07-23 DIAGNOSIS — C762 Malignant neoplasm of abdomen: Secondary | ICD-10-CM

## 2015-07-23 DIAGNOSIS — D701 Agranulocytosis secondary to cancer chemotherapy: Secondary | ICD-10-CM | POA: Diagnosis not present

## 2015-07-23 DIAGNOSIS — T451X5A Adverse effect of antineoplastic and immunosuppressive drugs, initial encounter: Principal | ICD-10-CM

## 2015-07-23 MED ORDER — PEGFILGRASTIM INJECTION 6 MG/0.6ML ~~LOC~~
6.0000 mg | PREFILLED_SYRINGE | Freq: Once | SUBCUTANEOUS | Status: AC
  Administered 2015-07-23: 6 mg via SUBCUTANEOUS
  Filled 2015-07-23: qty 0.6

## 2015-07-23 NOTE — Telephone Encounter (Signed)
lvm we are checking on her b/c of low neutrophils. Any sign of sustained fever 100.5 or a spiked fever of higher number. She can talk with injection nurse today if she has any issues.

## 2015-07-23 NOTE — Patient Instructions (Addendum)
Tbo-Filgrastim injection What is this medicine? TBO-FILGRASTIM (T B O fil GRA stim) is a granulocyte colony-stimulating factor that stimulates the growth of neutrophils, a type of white blood cell important in the body's fight against infection. It is used to reduce the incidence of fever and infection in patients with certain types of cancer who are receiving chemotherapy that affects the bone marrow. This medicine may be used for other purposes; ask your health care provider or pharmacist if you have questions. What should I tell my health care provider before I take this medicine? They need to know if you have any of these conditions: -ongoing radiation therapy -sickle cell anemia -an unusual or allergic reaction to tbo-filgrastim, filgrastim, pegfilgrastim, other medicines, foods, dyes, or preservatives -pregnant or trying to get pregnant -breast-feeding How should I use this medicine? This medicine is for injection under the skin. If you get this medicine at home, you will be taught how to prepare and give this medicine. Refer to the Instructions for Use that come with your medication packaging. Use exactly as directed. Take your medicine at regular intervals. Do not take your medicine more often than directed. It is important that you put your used needles and syringes in a special sharps container. Do not put them in a trash can. If you do not have a sharps container, call your pharmacist or healthcare provider to get one. Talk to your pediatrician regarding the use of this medicine in children. Special care may be needed. Overdosage: If you think you have taken too much of this medicine contact a poison control center or emergency room at once. NOTE: This medicine is only for you. Do not share this medicine with others. What if I miss a dose? It is important not to miss your dose. Call your doctor or health care professional if you miss a dose. What may interact with this medicine? This  medicine may interact with the following medications: -medicines that may cause a release of neutrophils, such as lithium This list may not describe all possible interactions. Give your health care provider a list of all the medicines, herbs, non-prescription drugs, or dietary supplements you use. Also tell them if you smoke, drink alcohol, or use illegal drugs. Some items may interact with your medicine. What should I watch for while using this medicine? You may need blood work done while you are taking this medicine. What side effects may I notice from receiving this medicine? Side effects that you should report to your doctor or health care professional as soon as possible: -allergic reactions like skin rash, itching or hives, swelling of the face, lips, or tongue -shortness of breath or breathing problems -fever -pain, redness, or irritation at site where injected -pinpoint red spots on the skin -stomach or side pain, or pain at the shoulder -swelling -tiredness -trouble passing urine Side effects that usually do not require medical attention (Report these to your doctor or health care professional if they continue or are bothersome.): -bone pain -muscle pain This list may not describe all possible side effects. Call your doctor for medical advice about side effects. You may report side effects to FDA at 1-800-FDA-1088. Where should I keep my medicine? Keep out of the reach of children. Store in a refrigerator between 2 and 8 degrees C (36 and 46 degrees F). Keep in carton to protect from light. Throw away this medicine if it is left out of the refrigerator for more than 5 consecutive days. Throw away any unused   medicine after the expiration date. NOTE: This sheet is a summary. It may not cover all possible information. If you have questions about this medicine, talk to your doctor, pharmacist, or health care provider.    2016, Elsevier/[employer] Standard. (2013-05-24  11:52:29) Pegfilgrastim injection What is this medicine? PEGFILGRASTIM (PEG fil gra stim) is a long-acting granulocyte colony-stimulating factor that stimulates the growth of neutrophils, a type of white blood cell important in the body's fight against infection. It is used to reduce the incidence of fever and infection in patients with certain types of cancer who are receiving chemotherapy that affects the bone marrow, and to increase survival after being exposed to high doses of radiation. This medicine may be used for other purposes; ask your health care provider or pharmacist if you have questions. What should I tell my health care provider before I take this medicine? They need to know if you have any of these conditions: -kidney disease -latex allergy -ongoing radiation therapy -sickle cell disease -skin reactions to acrylic adhesives (On-Body Injector only) -an unusual or allergic reaction to pegfilgrastim, filgrastim, other medicines, foods, dyes, or preservatives -pregnant or trying to get pregnant -breast-feeding How should I use this medicine? This medicine is for injection under the skin. If you get this medicine at home, you will be taught how to prepare and give the pre-filled syringe or how to use the On-body Injector. Refer to the patient Instructions for Use for detailed instructions. Use exactly as directed. Take your medicine at regular intervals. Do not take your medicine more often than directed. It is important that you put your used needles and syringes in a special sharps container. Do not put them in a trash can. If you do not have a sharps container, call your pharmacist or healthcare provider to get one. Talk to your pediatrician regarding the use of this medicine in children. While this drug may be prescribed for selected conditions, precautions do apply. Overdosage: If you think you have taken too much of this medicine contact a poison control center or emergency room  at once. NOTE: This medicine is only for you. Do not share this medicine with others. What if I miss a dose? It is important not to miss your dose. Call your doctor or health care professional if you miss your dose. If you miss a dose due to an On-body Injector failure or leakage, a new dose should be administered as soon as possible using a single prefilled syringe for manual use. What may interact with this medicine? Interactions have not been studied. Give your health care provider a list of all the medicines, herbs, non-prescription drugs, or dietary supplements you use. Also tell them if you smoke, drink alcohol, or use illegal drugs. Some items may interact with your medicine. This list may not describe all possible interactions. Give your health care provider a list of all the medicines, herbs, non-prescription drugs, or dietary supplements you use. Also tell them if you smoke, drink alcohol, or use illegal drugs. Some items may interact with your medicine. What should I watch for while using this medicine? You may need blood work done while you are taking this medicine. If you are going to need a MRI, CT scan, or other procedure, tell your doctor that you are using this medicine (On-Body Injector only). What side effects may I notice from receiving this medicine? Side effects that you should report to your doctor or health care professional as soon as possible: -allergic reactions like skin  rash, itching or hives, swelling of the face, lips, or tongue -dizziness -fever -pain, redness, or irritation at site where injected -pinpoint red spots on the skin -red or dark-brown urine -shortness of breath or breathing problems -stomach or side pain, or pain at the shoulder -swelling -tiredness -trouble passing urine or change in the amount of urine Side effects that usually do not require medical attention (report to your doctor or health care professional if they continue or are  bothersome): -bone pain -muscle pain This list may not describe all possible side effects. Call your doctor for medical advice about side effects. You may report side effects to FDA at 1-800-FDA-1088. Where should I keep my medicine? Keep out of the reach of children. Store pre-filled syringes in a refrigerator between 2 and 8 degrees C (36 and 46 degrees F). Do not freeze. Keep in carton to protect from light. Throw away this medicine if it is left out of the refrigerator for more than 48 hours. Throw away any unused medicine after the expiration date. NOTE: This sheet is a summary. It may not cover all possible information. If you have questions about this medicine, talk to your doctor, pharmacist, or health care provider.    2016, Elsevier/Gold Standard. (2014-02-21 14:30:14)

## 2015-07-24 ENCOUNTER — Ambulatory Visit: Payer: BLUE CROSS/BLUE SHIELD | Admitting: Neurology

## 2015-07-27 ENCOUNTER — Other Ambulatory Visit: Payer: Self-pay | Admitting: Oncology

## 2015-07-27 DIAGNOSIS — C5701 Malignant neoplasm of right fallopian tube: Secondary | ICD-10-CM

## 2015-07-28 ENCOUNTER — Telehealth: Payer: Self-pay | Admitting: Oncology

## 2015-07-28 ENCOUNTER — Ambulatory Visit (HOSPITAL_BASED_OUTPATIENT_CLINIC_OR_DEPARTMENT_OTHER): Payer: BLUE CROSS/BLUE SHIELD | Admitting: Oncology

## 2015-07-28 ENCOUNTER — Encounter: Payer: Self-pay | Admitting: Oncology

## 2015-07-28 ENCOUNTER — Other Ambulatory Visit (HOSPITAL_BASED_OUTPATIENT_CLINIC_OR_DEPARTMENT_OTHER): Payer: BLUE CROSS/BLUE SHIELD

## 2015-07-28 ENCOUNTER — Ambulatory Visit: Payer: BLUE CROSS/BLUE SHIELD

## 2015-07-28 ENCOUNTER — Other Ambulatory Visit: Payer: BLUE CROSS/BLUE SHIELD

## 2015-07-28 VITALS — BP 109/61 | HR 66 | Temp 98.0°F | Resp 18 | Ht 62.0 in | Wt 120.8 lb

## 2015-07-28 DIAGNOSIS — C482 Malignant neoplasm of peritoneum, unspecified: Secondary | ICD-10-CM

## 2015-07-28 DIAGNOSIS — C5701 Malignant neoplasm of right fallopian tube: Secondary | ICD-10-CM

## 2015-07-28 DIAGNOSIS — R209 Unspecified disturbances of skin sensation: Secondary | ICD-10-CM

## 2015-07-28 DIAGNOSIS — R202 Paresthesia of skin: Secondary | ICD-10-CM

## 2015-07-28 DIAGNOSIS — T451X5A Adverse effect of antineoplastic and immunosuppressive drugs, initial encounter: Secondary | ICD-10-CM

## 2015-07-28 DIAGNOSIS — K589 Irritable bowel syndrome without diarrhea: Secondary | ICD-10-CM

## 2015-07-28 DIAGNOSIS — D701 Agranulocytosis secondary to cancer chemotherapy: Secondary | ICD-10-CM

## 2015-07-28 LAB — COMPREHENSIVE METABOLIC PANEL
ALT: 17 U/L (ref 0–55)
AST: 17 U/L (ref 5–34)
Albumin: 4.4 g/dL (ref 3.5–5.0)
Alkaline Phosphatase: 105 U/L (ref 40–150)
Anion Gap: 9 mEq/L (ref 3–11)
BUN: 9.8 mg/dL (ref 7.0–26.0)
CO2: 29 mEq/L (ref 22–29)
Calcium: 10 mg/dL (ref 8.4–10.4)
Chloride: 103 mEq/L (ref 98–109)
Creatinine: 0.7 mg/dL (ref 0.6–1.1)
EGFR: >90 mL/min/{1.73_m2} (ref >90)
Glucose: 94 mg/dl (ref 70–140)
Potassium: 4.4 mEq/L (ref 3.5–5.1)
Sodium: 140 mEq/L (ref 136–145)
Total Bilirubin: 0.33 mg/dL (ref 0.20–1.20)
Total Protein: 7.8 g/dL (ref 6.4–8.3)

## 2015-07-28 LAB — CBC WITH DIFFERENTIAL/PLATELET
BASO%: 0.1 % (ref 0.0–2.0)
Basophils Absolute: 0 10*3/uL (ref 0.0–0.1)
EOS%: 0.4 % (ref 0.0–7.0)
Eosinophils Absolute: 0.1 10*3/uL (ref 0.0–0.5)
HCT: 36.7 % (ref 34.8–46.6)
HGB: 12.3 g/dL (ref 11.6–15.9)
LYMPH%: 17.1 % (ref 14.0–49.7)
MCH: 32.8 pg (ref 25.1–34.0)
MCHC: 33.5 g/dL (ref 31.5–36.0)
MCV: 97.9 fL (ref 79.5–101.0)
MONO#: 1.8 10*3/uL — ABNORMAL HIGH (ref 0.1–0.9)
MONO%: 12.6 % (ref 0.0–14.0)
NEUT#: 9.9 10*3/uL — ABNORMAL HIGH (ref 1.5–6.5)
NEUT%: 69.8 % (ref 38.4–76.8)
Platelets: 204 10*3/uL (ref 145–400)
RBC: 3.75 10*6/uL (ref 3.70–5.45)
RDW: 14 % (ref 11.2–14.5)
WBC: 14.2 10*3/uL — ABNORMAL HIGH (ref 3.9–10.3)
lymph#: 2.4 10*3/uL (ref 0.9–3.3)

## 2015-07-28 NOTE — Progress Notes (Signed)
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 Due to unusual neuropathy symptoms possibly related to taxol, cycle 5 on 07-21-15 was carboplatin only. She had neulasta on 07-23-15, previously needed multiple doses of granix to support counts. Plan was 6 cycles of adjuvant chemotherapy, tho may consider additional 3 weekly taxol after cycle 6 if tolerated, as cycle 5 was [place] only.  She had no worsening of the neuropathy symptoms neck/ jaw, medial to left scapula following this single agent carboplatin. She had some numbness radiating down right leg intermittently, none in last 2 days. She has not started gabapentin by Dr Jannifer Franklin, but will add that now as we had discussed. After day 3 neulasta, patient had some generalized aches starting day 5, most severe on day 6 , resolving by day 7 and gone by day 8. She had no BM x 6 days, but used only colace instead of adding senokot, felt generally badly with the constipation. Bowels are again moving well now. She had some nausea with the aches and constipation, also now resolved and is eating and drinking fluids well. She was very sedentary on couch with aches and constipation, which may have been factor in right leg symptoms.  She has had no fever or symptoms of infection, no SOB, no bleeding, no LE swelling, no bladder symptoms. Peripheral IV access ok for that chemo.  Remainder of 10 point Review of Systems negative.    CA 125 1226 on 03-20-2015 No central line Flu vaccine 03-25-15 Genetics testing negative by Breast Ovarian panel by [facility] sent 05-05-15, tho VUS with PALB2.  ONCOLOGIC HISTORY Patient has been generally healthy, post vaginal hysterectomy for fibroids and menorrhagia 2012. In ~  11-2014 she noticed bladder pressure and occasional trace blood ? Vaginal vs urinary. She was seen by Dr Pilar Jarvis, with cystoscopy 01-31-15 suggesting extrinsic compression of bladder. She had CT AP at [facility] 03-04-14, with moderate ascites, omental caking in lateral left abdomen and pelvis, absent uterus, "ovaries are likely visualized" , no adenopathy, no hydronephrosis or renal stones, lung bases clear, hemangioma posterior right hepatic lobe (CT report to be scanned into this EMR). CA 125 by Dr Collene Mares 03-06-15 was 957, with CEA 1.0. She had colonoscopy by Dr Collene Mares on 03-12-15, reportedly unremarkable. She had consultation with Dr Denman George on 03-17-15, with plan for initial surgery if amenable to optimal debulking, otherwise to begin with neoadjuvant chemotherapy. CT chest 03-21-15 no findings of concern. She had exploratory laparotomy with BSO, omentectomy and radical R0 debulking on 03-27-15, pathology ([id number]) high grade serous carcinoma involving bilateral tubes and ovaries, omentum and peritoneal nodule, final diagnosis IIIC right fallopian. She began chemotherapy with carboplatin and taxol on 04-14-15. She was neutropenic by day 8 cycle 1, ANC 0.9. Cycle 5 was delayed with unusual neuropathy symptoms and cytopenias.    Objective:  Vital signs in last 24 hours: Weight 120 lb 12 oz. . BP 109/ 61. HR 66 regular, resp 18 not labored. Temp 98, O2 sat 100 Weight stable Alert, oriented and appropriate. Easily and quickly mobile, ambulates without difficulty.  Complete alopecia  HEENT:PERRL, sclerae not icteric. Oral mucosa moist without lesions, posterior pharynx clear.  Neck supple. No JVD.  Lymphatics:no supraclavicular or inguinal adenopathy Resp: clear to auscultation bilaterally and  normal percussion bilaterally Cardio: regular rate and rhythm. No gallop. GI: soft, nontender, not distended, no mass or organomegaly. Some active bowel sounds. Surgical incision not remarkable. Musculoskeletal/  Extremities: without pitting edema, cords, tenderness Neuro: no peripheral neuropathy. Otherwise nonfocal Skin without rash, ecchymosis, petechiae  Lab Results:  Results for orders placed or performed in visit on 07/28/15  CBC with Differential  Result Value Ref Range   WBC 14.2 (H) 3.9 - 10.3 10e3/uL   NEUT# 9.9 (H) 1.5 - 6.5 10e3/uL   HGB 12.3 11.6 - 15.9 g/dL   HCT 36.7 34.8 - 46.6 %   Platelets 204 145 - 400 10e3/uL   MCV 97.9 79.5 - 101.0 fL   MCH 32.8 25.1 - 34.0 pg   MCHC 33.5 31.5 - 36.0 g/dL   RBC 3.75 3.70 - 5.45 10e6/uL   RDW 14.0 11.2 - 14.5 %   lymph# 2.4 0.9 - 3.3 10e3/uL   MONO# 1.8 (H) 0.1 - 0.9 10e3/uL   Eosinophils Absolute 0.1 0.0 - 0.5 10e3/uL   Basophils Absolute 0.0 0.0 - 0.1 10e3/uL   NEUT% 69.8 38.4 - 76.8 %   LYMPH% 17.1 14.0 - 49.7 %   MONO% 12.6 0.0 - 14.0 %   EOS% 0.4 0.0 - 7.0 %   BASO% 0.1 0.0 - 2.0 %  Comprehensive metabolic panel  Result Value Ref Range   Sodium 140 136 - 145 mEq/L   Potassium 4.4 3.5 - 5.1 mEq/L   Chloride 103 98 - 109 mEq/L   CO2 29 22 - 29 mEq/L   Glucose 94 70 - 140 mg/dl   BUN 9.8 7.0 - 26.0 mg/dL   Creatinine 0.7 0.6 - 1.1 mg/dL   Total Bilirubin 0.33 0.20 - 1.20 mg/dL   Alkaline Phosphatase 105 40 - 150 U/L   AST 17 5 - 34 U/L   ALT 17 0 - 55 U/L   Total Protein 7.8 6.4 - 8.3 g/dL   Albumin 4.4 3.5 - 5.0 g/dL   Calcium 10.0 8.4 - 10.4 mg/dL   Anion Gap 9 3 - 11 mEq/L   EGFR >90 >90 ml/min/1.73 m2   CBC reviewed at visit CA 125 to be drawn 08-11-15  Studies/Results:  No results found.  Medications: I have reviewed the patient's current medications.  DISCUSSION Patient felt that aches with neulasta were very difficult, but is pleased that counts are in good range today; I have told her that counts can drop even with neulasta, tho usually recover quickly if so.  We are also pleased that neuropathy symptoms are minimal and stable now. We have discussed trying low dose weekly taxol x 3 with cycle 6 (and  possibly an additional 3 weeks of taxol if tolerates). Patient has beach trip July 22-29 that she is reluctant to miss, patient in tears about this today. Will continue discussion as we see how taxol is tolerated. Plan day  1 cycle 6 with carbo short taxol on 08-11-15. She will need granix instead of neulasta with weekly dose taxol.  Verbal consent for low dose taxol.  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 Adjuvant carboplatin taxol begun 04-14-15, neutropenic day 8 cycle 2. 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 Cycle 5 delayed x 2 weeks with neurologic concerns and cytopenias, given as [place] only on 07-21-15. Will try adding back taxol using low dose weekly schedule starting day 1 cycle 6 on 08-11-15.  2. Paresthesias in face and jaw,  left shoulder/ LUE: first noticed week prior to #4 chemo, then increased after #4 chemo. Extensive evaluation by neurology has not identified cause, tho this may be taxol related. Single agent carboplatin given cycle 5, 2 weeks delayed with the neuropathy concerns. Neuropathy no worse/ fairly minimal now. Will try adding back taxol using low dose as noted. Dr Jannifer Franklin following. 3.chemo neutropenia: prolonged after cycle 4, given total 7 granix. Will need granix with weekly taxol.  4.irritable bowel syndrome x years, known to Dr Collene Mares. Would not use probiotics while neutropenic. Use laxatives at least prn with chemo. 5. Peripheral IV access still adequate 6.heterogeneously dense breast tissue including most recent 3D mammograms at [place] 03-26-15 7. Post cholecystecytomy 8.surgery for crushed left femur age 50 9. post hysterectomy for benign indications 2012. History of endometriosis.   All questions answered. Chemo and granix orders placed, notified managed care of change again in gCSF. Time spent 30 min including >50% counseling and coordination of care.  Route PCP, cc Dr Jannifer Franklin and Dr  Bartholomew Boards, MD   07/29/2015, 9:17 PM

## 2015-07-28 NOTE — Telephone Encounter (Signed)
appt made and avs printed °

## 2015-07-29 ENCOUNTER — Telehealth: Payer: Self-pay | Admitting: Gynecologic Oncology

## 2015-07-29 NOTE — Telephone Encounter (Signed)
Returned call to patient.  Left message asking her to please call the office back.

## 2015-07-30 ENCOUNTER — Ambulatory Visit: Payer: BLUE CROSS/BLUE SHIELD

## 2015-07-30 ENCOUNTER — Other Ambulatory Visit: Payer: Self-pay | Admitting: Oncology

## 2015-07-30 ENCOUNTER — Telehealth: Payer: Self-pay

## 2015-07-30 NOTE — Telephone Encounter (Signed)
Patient called asking questions about her new treatment change.  All questions answered.

## 2015-07-30 NOTE — Telephone Encounter (Signed)
Fine to get UA C&S today or tomorrow.   thanks

## 2015-07-30 NOTE — Telephone Encounter (Signed)
Pt called stating she has pain on urination and a little blood on tissue with wiping. The volume of urine was decreased as well. This started yesterday evening. She did say she did not drink as much yesterday. She is pushing fluids today and drinking cranberry juice. She is going to get some Azo, she did not have any in the house last night. She will call tomorrow if not any better.

## 2015-07-31 ENCOUNTER — Other Ambulatory Visit: Payer: Self-pay | Admitting: *Deleted

## 2015-07-31 ENCOUNTER — Ambulatory Visit (HOSPITAL_BASED_OUTPATIENT_CLINIC_OR_DEPARTMENT_OTHER): Payer: BLUE CROSS/BLUE SHIELD

## 2015-07-31 ENCOUNTER — Other Ambulatory Visit: Payer: Self-pay | Admitting: Oncology

## 2015-07-31 ENCOUNTER — Telehealth: Payer: Self-pay | Admitting: *Deleted

## 2015-07-31 ENCOUNTER — Telehealth: Payer: Self-pay

## 2015-07-31 ENCOUNTER — Telehealth: Payer: Self-pay | Admitting: Oncology

## 2015-07-31 ENCOUNTER — Telehealth: Payer: Self-pay | Admitting: Gynecologic Oncology

## 2015-07-31 DIAGNOSIS — C762 Malignant neoplasm of abdomen: Secondary | ICD-10-CM

## 2015-07-31 LAB — URINALYSIS, MICROSCOPIC - CHCC
Bilirubin (Urine): NEGATIVE
Blood: NEGATIVE
Glucose: NEGATIVE mg/dL
Ketones: NEGATIVE mg/dL
Leukocyte Esterase: NEGATIVE
Nitrite: NEGATIVE
Protein: NEGATIVE mg/dL
RBC / HPF: NEGATIVE (ref 0–2)
Specific Gravity, Urine: 1.005 (ref 1.003–1.035)
Urobilinogen, UR: 0.2 mg/dL (ref 0.2–1)
WBC, UA: NEGATIVE (ref 0–2)
pH: 7 (ref 4.6–8.0)

## 2015-07-31 NOTE — Telephone Encounter (Signed)
Told Kathryn Ramsey the results of the u/a as noted below by Dr. [name].   Kathryn Ramsey began AZO last evening and it was helpful. She will increase the fluids as recommended by Dr. [name].

## 2015-07-31 NOTE — Telephone Encounter (Signed)
-----   Message from Gordy Levan, MD sent at 07/31/2015 12:07 PM EDT ----- Labs seen and need follow up: please let her know not obviously infected on the UA, but we will follow up cx until final. AZO fine and she should also push fluids including cranberry juice if she can tolerate that

## 2015-07-31 NOTE — Telephone Encounter (Signed)
Patient stated that she is still having burning with urination. Per telephone encounter yesterday, ok with Dr. [name] to get UA/C&S. Order entered and patient on her way in to give specimen.

## 2015-07-31 NOTE — Telephone Encounter (Signed)
Medical Oncology  Returned call to patient to address questions and concerns related to completion of chemo.  1. She is not convinced that neuropathy is taxol related.   Neither are Dr Jannifer Franklin or I, but we cannot rule this out. I explained that Dr Jannifer Franklin was particularly concerned that, if taxol related, full dose could cause lasting and problematic neuropathy. She does not feel that he was that direct in his conversation with her, but certainly very clear in discussions with me.  2. She is concerned that lower weekly doses of taxol will be less effective against cancer than every 3 week doses.  I have told her that direct comparison studies of weekly vs every 3 week dosing have shown equivalence in [name] population.   3. She is concerned that peripheral veins may not be adequate for # of treatments. She did have slight irritation at 2 IV sites at initiation of treatment, both of which have improved.  I have told her that taxol is not very irritating to peripheral veins, that veins generally do improve out from chemo, and that PICC line would be an option if necessary.  4. She would still like to take full dose taxol.  I am not comfortable with that, but certainly we could make referral to another physician at [facility] or other to consider if that is what she requests.  She did not ask for referral now.  She had spoken with gyn oncology staff, however I told her that Dr Denman George had not had conversation with Dr Jannifer Franklin during his evaluation as far as I know.    Patient appreciated call. She has same slight tingling in jaw, neck and medial to left scapula. She is on her way to Dr Jannifer Franklin' daughter's wedding.  Godfrey Pick, MD

## 2015-07-31 NOTE — Telephone Encounter (Signed)
Patient called and upcoming treatments discussed.  Asking about her urinalysis.  Informed of results and Dr. Mariana Kaufman recommendations attached to the results.  No concerns voiced.  Advised to call for any needs.

## 2015-08-01 LAB — URINE CULTURE: Organism ID, Bacteria: NO GROWTH

## 2015-08-05 ENCOUNTER — Telehealth: Payer: Self-pay

## 2015-08-05 NOTE — Telephone Encounter (Signed)
-----   Message from Gordy Levan, MD sent at 08/03/2015  9:20 AM EDT ----- Labs seen and need follow up: please let her know culture negative

## 2015-08-05 NOTE — Telephone Encounter (Signed)
Told Ms Maglio the result of the urine culture as noted below by Dr. [name]. Ms. Tomlinson inquired about taking decadron premedication for the Taxol treatment on 08-11-15.  This will start the weekly Taxol dosing.  Told Ms Gladman to take the decadron 16 mg 12 and 6 hr prior and discuss with Dr. [name] at 08-11-15 visit if she wants her to continue with decadron premed as above or decrease to  only 12 hrs prior to taxol. Ms. Sudol verbalized understanding.

## 2015-08-10 ENCOUNTER — Other Ambulatory Visit: Payer: Self-pay | Admitting: Oncology

## 2015-08-11 ENCOUNTER — Encounter: Payer: Self-pay | Admitting: Oncology

## 2015-08-11 ENCOUNTER — Other Ambulatory Visit (HOSPITAL_BASED_OUTPATIENT_CLINIC_OR_DEPARTMENT_OTHER): Payer: BLUE CROSS/BLUE SHIELD

## 2015-08-11 ENCOUNTER — Other Ambulatory Visit: Payer: BLUE CROSS/BLUE SHIELD

## 2015-08-11 ENCOUNTER — Ambulatory Visit (HOSPITAL_BASED_OUTPATIENT_CLINIC_OR_DEPARTMENT_OTHER): Payer: BLUE CROSS/BLUE SHIELD

## 2015-08-11 ENCOUNTER — Ambulatory Visit: Payer: BLUE CROSS/BLUE SHIELD

## 2015-08-11 ENCOUNTER — Ambulatory Visit (HOSPITAL_BASED_OUTPATIENT_CLINIC_OR_DEPARTMENT_OTHER): Payer: BLUE CROSS/BLUE SHIELD | Admitting: Oncology

## 2015-08-11 VITALS — BP 114/67 | HR 77 | Temp 98.3°F | Resp 18 | Ht 62.0 in | Wt 124.4 lb

## 2015-08-11 DIAGNOSIS — R209 Unspecified disturbances of skin sensation: Secondary | ICD-10-CM | POA: Diagnosis not present

## 2015-08-11 DIAGNOSIS — K589 Irritable bowel syndrome without diarrhea: Secondary | ICD-10-CM

## 2015-08-11 DIAGNOSIS — C762 Malignant neoplasm of abdomen: Secondary | ICD-10-CM

## 2015-08-11 DIAGNOSIS — C5701 Malignant neoplasm of right fallopian tube: Secondary | ICD-10-CM

## 2015-08-11 DIAGNOSIS — Z5111 Encounter for antineoplastic chemotherapy: Secondary | ICD-10-CM

## 2015-08-11 DIAGNOSIS — Z79899 Other long term (current) drug therapy: Secondary | ICD-10-CM

## 2015-08-11 DIAGNOSIS — D701 Agranulocytosis secondary to cancer chemotherapy: Secondary | ICD-10-CM

## 2015-08-11 DIAGNOSIS — T451X5A Adverse effect of antineoplastic and immunosuppressive drugs, initial encounter: Secondary | ICD-10-CM

## 2015-08-11 DIAGNOSIS — R202 Paresthesia of skin: Secondary | ICD-10-CM

## 2015-08-11 LAB — COMPREHENSIVE METABOLIC PANEL
ALT: 19 U/L (ref 0–55)
AST: 17 U/L (ref 5–34)
Albumin: 4.5 g/dL (ref 3.5–5.0)
Alkaline Phosphatase: 56 U/L (ref 40–150)
Anion Gap: 12 mEq/L — ABNORMAL HIGH (ref 3–11)
BUN: 12.5 mg/dL (ref 7.0–26.0)
CO2: 22 mEq/L (ref 22–29)
Calcium: 9.4 mg/dL (ref 8.4–10.4)
Chloride: 102 mEq/L (ref 98–109)
Creatinine: 0.8 mg/dL (ref 0.6–1.1)
EGFR: >90 mL/min/{1.73_m2} (ref >90)
Glucose: 154 mg/dl — ABNORMAL HIGH (ref 70–140)
Potassium: 3.8 mEq/L (ref 3.5–5.1)
Sodium: 136 mEq/L (ref 136–145)
Total Bilirubin: 0.54 mg/dL (ref 0.20–1.20)
Total Protein: 7.8 g/dL (ref 6.4–8.3)

## 2015-08-11 LAB — CBC WITH DIFFERENTIAL/PLATELET
BASO%: 0 % (ref 0.0–2.0)
Basophils Absolute: 0 10*3/uL (ref 0.0–0.1)
EOS%: 0 % (ref 0.0–7.0)
Eosinophils Absolute: 0 10*3/uL (ref 0.0–0.5)
HCT: 31.9 % — ABNORMAL LOW (ref 34.8–46.6)
HGB: 11 g/dL — ABNORMAL LOW (ref 11.6–15.9)
LYMPH%: 7.7 % — ABNORMAL LOW (ref 14.0–49.7)
MCH: 33.6 pg (ref 25.1–34.0)
MCHC: 34.5 g/dL (ref 31.5–36.0)
MCV: 97.6 fL (ref 79.5–101.0)
MONO#: 0 10*3/uL — ABNORMAL LOW (ref 0.1–0.9)
MONO%: 0 % (ref 0.0–14.0)
NEUT#: 2.8 10*3/uL (ref 1.5–6.5)
NEUT%: 92.3 % — ABNORMAL HIGH (ref 38.4–76.8)
Platelets: 215 10*3/uL (ref 145–400)
RBC: 3.27 10*6/uL — ABNORMAL LOW (ref 3.70–5.45)
RDW: 13.5 % (ref 11.2–14.5)
WBC: 3 10*3/uL — ABNORMAL LOW (ref 3.9–10.3)
lymph#: 0.2 10*3/uL — ABNORMAL LOW (ref 0.9–3.3)

## 2015-08-11 MED ORDER — SODIUM CHLORIDE 0.9 % IV SOLN
577.2000 mg | Freq: Once | INTRAVENOUS | Status: AC
  Administered 2015-08-11: 580 mg via INTRAVENOUS
  Filled 2015-08-11: qty 58

## 2015-08-11 MED ORDER — FAMOTIDINE IN NACL 20-0.9 MG/50ML-% IV SOLN
20.0000 mg | Freq: Once | INTRAVENOUS | Status: AC
  Administered 2015-08-11: 20 mg via INTRAVENOUS

## 2015-08-11 MED ORDER — DIPHENHYDRAMINE HCL 50 MG/ML IJ SOLN: 25.0000 mg | Freq: Once | INTRAMUSCULAR | Status: DC

## 2015-08-11 MED ORDER — SODIUM CHLORIDE 0.9% FLUSH
10.0000 mL | INTRAVENOUS | Status: DC | PRN
  Filled 2015-08-11: qty 10

## 2015-08-11 MED ORDER — SODIUM CHLORIDE 0.9 % IV SOLN
20.0000 mg | Freq: Once | INTRAVENOUS | Status: AC
  Administered 2015-08-11: 20 mg via INTRAVENOUS
  Filled 2015-08-11: qty 2

## 2015-08-11 MED ORDER — SODIUM CHLORIDE 0.9 % IV SOLN
80.0000 mg/m2 | Freq: Once | INTRAVENOUS | Status: AC
  Administered 2015-08-11: 126 mg via INTRAVENOUS
  Filled 2015-08-11: qty 21

## 2015-08-11 MED ORDER — PALONOSETRON HCL INJECTION 0.25 MG/5ML
0.2500 mg | Freq: Once | INTRAVENOUS | Status: AC
  Administered 2015-08-11: 0.25 mg via INTRAVENOUS

## 2015-08-11 MED ORDER — SODIUM CHLORIDE 0.9 % IV SOLN
Freq: Once | INTRAVENOUS | Status: AC
  Administered 2015-08-11: 13:00:00 via INTRAVENOUS

## 2015-08-11 MED ORDER — HEPARIN SOD (PORK) LOCK FLUSH 100 UNIT/ML IV SOLN
500.0000 [IU] | Freq: Once | INTRAVENOUS | Status: DC | PRN
  Filled 2015-08-11: qty 5

## 2015-08-11 MED ORDER — SODIUM CHLORIDE 0.9 % IV SOLN
25.0000 mg | Freq: Once | INTRAVENOUS | Status: AC
  Administered 2015-08-11: 25 mg via INTRAVENOUS
  Filled 2015-08-11: qty 0.5

## 2015-08-11 NOTE — Progress Notes (Signed)
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 Due to neurologic symptoms, cycle 5 chemo on 07-21-15 was carboplatin only. Plan is to add taxol again with cycle 6 starting today, using weekly dosing as we watch neurologic symptoms closely. As she will now receive weekly taxol, gCSF has been changed back to granix, that dose increased to 480 mcg due to prior cytopenias and delays.   Patient has felt very well this week, with good energy, no nausea, extremely minimal tingling now left face/ left jaw and medial to left scapula. She had another massage at [facility], which again seemed to help this upper back discomfort, and also started gabapentin at hs as Dr Jannifer Franklin had prescribed. Bowels are moving reasonably well, this a chronic problem. No significant neuropathy hands or feet. No fever or symptoms of infection. No abdominal or pelvic discomfort. No SOB. No fever or symptoms of infection.  Remainder of 10 point Review of Systems negative  CA 125 1226 on 03-20-2015 No central line Genetics testing negative by Breast Ovarian panel by [facility] sent 05-05-15, tho VUS with PALB2.  ONCOLOGIC HISTORY Patient has been generally healthy, post vaginal hysterectomy for fibroids and menorrhagia 2012. In ~ 11-2014 she noticed bladder pressure and occasional trace blood ? Vaginal vs urinary. She was seen by Dr Pilar Jarvis, with cystoscopy 01-31-15 suggesting extrinsic compression of bladder. She had CT AP at [facility] 03-04-14, with moderate ascites, omental caking in lateral left abdomen and pelvis, absent uterus, "ovaries are likely visualized" , no adenopathy, no hydronephrosis or renal stones, lung bases clear, hemangioma  posterior right hepatic lobe (CT report to be scanned into this EMR). CA 125 by Dr Collene Mares 03-06-15 was 957, with CEA 1.0. She had colonoscopy by Dr Collene Mares on 03-12-15, reportedly unremarkable. She had consultation with Dr Denman George on 03-17-15, with plan for initial surgery if amenable to optimal debulking, otherwise to begin with neoadjuvant chemotherapy. CT chest 03-21-15 no findings of concern. She had exploratory laparotomy with BSO, omentectomy and radical R0 debulking on 03-27-15, pathology ([id number]) high grade serous carcinoma involving bilateral tubes and ovaries, omentum and peritoneal nodule, final diagnosis IIIC right fallopian. She began chemotherapy with carboplatin and taxol on 04-14-15. She was neutropenic by day 8 cycle 1, ANC 0.9. Cycle 5 was delayed with unusual neuropathy symptoms and cytopenias, given as carboplatin only on 07-21-15 with neulasta. Cycle 6 began 08-11-15 using weekly taxol and granix support.    Objective:  Vital signs in last 24 hours:  BP 114/67 mmHg  Pulse 77  Temp(Src) 98.3 F (36.8 C) (Oral)  Resp 18  Ht '5\' 2"'  (1.575 m)  Wt 124 lb 6.4 oz (56.427 kg)  BMI 22.75 kg/m2  SpO2 99% Weight up 4 lbs. Alert, oriented and appropriate. Ambulatory without difficulty, easily able to get on and off exam table Alopecia  HEENT:PERRL, sclerae not icteric. Oral mucosa moist without lesions, posterior pharynx clear.  Neck supple. No JVD.  Lymphatics:no cervical,supraclavicular or inguinal adenopathy Resp: clear to auscultation bilaterally and normal percussion bilaterally Cardio: regular rate and rhythm. No gallop. GI: soft, nontender, not distended, no mass or organomegaly. Normally active bowel sounds. Surgical incision not remarkable. Musculoskeletal/ Extremities: without pitting edema, cords, tenderness. Back not tender to palpation  Neuro: no peripheral neuropathy hands/ feet. No other obvious defecits. PSYCH appropriate mood and affect Skin without rash, ecchymosis,  petechiae   Lab Results:  Results for orders placed or performed in visit on 08/11/15  CBC with Differential  Result Value Ref Range   WBC 3.0 (L) 3.9 - 10.3 10e3/uL   NEUT# 2.8 1.5 - 6.5 10e3/uL   HGB 11.0 (L) 11.6 - 15.9 g/dL   HCT 31.9 (L) 34.8 - 46.6 %   Platelets 215 145 - 400 10e3/uL   MCV 97.6 79.5 - 101.0 fL   MCH 33.6 25.1 - 34.0 pg   MCHC 34.5 31.5 - 36.0 g/dL   RBC 3.27 (L) 3.70 - 5.45 10e6/uL   RDW 13.5 11.2 - 14.5 %   lymph# 0.2 (L) 0.9 - 3.3 10e3/uL   MONO# 0.0 (L) 0.1 - 0.9 10e3/uL   Eosinophils Absolute 0.0 0.0 - 0.5 10e3/uL   Basophils Absolute 0.0 0.0 - 0.1 10e3/uL   NEUT% 92.3 (H) 38.4 - 76.8 %   LYMPH% 7.7 (L) 14.0 - 49.7 %   MONO% 0.0 0.0 - 14.0 %   EOS% 0.0 0.0 - 7.0 %   BASO% 0.0 0.0 - 2.0 %  Comprehensive metabolic panel  Result Value Ref Range   Sodium 136 136 - 145 mEq/L   Potassium 3.8 3.5 - 5.1 mEq/L   Chloride 102 98 - 109 mEq/L   CO2 22 22 - 29 mEq/L   Glucose 154 (H) 70 - 140 mg/dl   BUN 12.5 7.0 - 26.0 mg/dL   Creatinine 0.8 0.6 - 1.1 mg/dL   Total Bilirubin 0.54 0.20 - 1.20 mg/dL   Alkaline Phosphatase 56 40 - 150 U/L   AST 17 5 - 34 U/L   ALT 19 0 - 55 U/L   Total Protein 7.8 6.4 - 8.3 g/dL   Albumin 4.5 3.5 - 5.0 g/dL   Calcium 9.4 8.4 - 10.4 mg/dL   Anion Gap 12 (H) 3 - 11 mEq/L   EGFR >90 >90 ml/min/1.73 m2  CA 125  Result Value Ref Range   Cancer Antigen (CA) 125 18.7 0.0 - 38.1 U/mL   CA 125 resulted after visit  Studies/Results:  No results found.  Medications: I have reviewed the patient's current medications. Continue gabapentin. She took full dose decadron prior to taxol today and will do same prior to treatment next week, then expect to decrease to 20 mg 12 hrs prior if no allergic taxol reaction. Granix 480 mcg 6-27, 6-28, 6-29  DISCUSSION Patient and family are in agreement with plan now and know to call if any concerns prior to next scheduled visit on 7-3, day 8 cycle 6 taxol also 7-3.     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 Adjuvant carboplatin taxol begun 04-14-15, neutropenic day 8 cycle 2. 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 Cycle 5 delayed x 2 weeks with neurologic concerns and cytopenias, given as [place] only on 07-21-15. Will try adding back taxol using low dose weekly schedule starting day 1 cycle 6 on 08-11-15. Granix support at increased dose. 2. Paresthesias in face and jaw, left shoulder/ LUE: first noticed week prior to #4 chemo, then increased after #4 chemo. Extensive evaluation by neurology has not identified cause, tho this may be taxol related. Single agent carboplatin given cycle 5, 2 weeks delayed with the neuropathy concerns. Neuropathy no worse/ fairly minimal now. Will try adding back taxol using low dose  as noted. Dr Jannifer Franklin following. 3.chemo neutropenia: prolonged after cycle 4, given total 7 granix. Will need granix with weekly taxol.  4.irritable bowel syndrome x years, known to Dr Collene Mares. Would not use probiotics while neutropenic. Use laxatives at least prn with chemo. 5. Peripheral IV access still adequate 6.heterogeneously dense breast tissue including most recent 3D mammograms at [place] 03-26-15 7. Post cholecystecytomy 8.surgery for crushed left femur age 89 9. post hysterectomy for benign indications 2012. History of endometriosis.   All questions answered. CHemo and granix orders confirmed. Time spent 25 min including >50% counseling and coordination of care.  Route PCP, cc Dr Cory Roughen, MD   08/13/2015, 9:23 AM

## 2015-08-12 ENCOUNTER — Telehealth: Payer: Self-pay

## 2015-08-12 ENCOUNTER — Ambulatory Visit (HOSPITAL_BASED_OUTPATIENT_CLINIC_OR_DEPARTMENT_OTHER): Payer: BLUE CROSS/BLUE SHIELD

## 2015-08-12 VITALS — BP 121/71 | HR 61 | Temp 98.4°F | Resp 18

## 2015-08-12 DIAGNOSIS — C762 Malignant neoplasm of abdomen: Secondary | ICD-10-CM

## 2015-08-12 DIAGNOSIS — C5701 Malignant neoplasm of right fallopian tube: Secondary | ICD-10-CM | POA: Diagnosis not present

## 2015-08-12 DIAGNOSIS — D701 Agranulocytosis secondary to cancer chemotherapy: Secondary | ICD-10-CM | POA: Diagnosis not present

## 2015-08-12 DIAGNOSIS — T451X5A Adverse effect of antineoplastic and immunosuppressive drugs, initial encounter: Principal | ICD-10-CM

## 2015-08-12 LAB — CA 125: Cancer Antigen (CA) 125: 18.7 U/mL (ref 0.0–38.1)

## 2015-08-12 MED ORDER — TBO-FILGRASTIM 480 MCG/0.8ML ~~LOC~~ SOSY
480.0000 ug | PREFILLED_SYRINGE | Freq: Once | SUBCUTANEOUS | Status: AC
  Administered 2015-08-12: 480 ug via SUBCUTANEOUS
  Filled 2015-08-12: qty 0.8

## 2015-08-12 NOTE — Patient Instructions (Signed)
Tbo-Filgrastim injection What is this medicine? TBO-FILGRASTIM (T B O fil GRA stim) is a granulocyte colony-stimulating factor that stimulates the growth of neutrophils, a type of white blood cell important in the body's fight against infection. It is used to reduce the incidence of fever and infection in patients with certain types of cancer who are receiving chemotherapy that affects the bone marrow. This medicine may be used for other purposes; ask your health care provider or pharmacist if you have questions. What should I tell my health care provider before I take this medicine? They need to know if you have any of these conditions: -ongoing radiation therapy -sickle cell anemia -an unusual or allergic reaction to tbo-filgrastim, filgrastim, pegfilgrastim, other medicines, foods, dyes, or preservatives -pregnant or trying to get pregnant -breast-feeding How should I use this medicine? This medicine is for injection under the skin. If you get this medicine at home, you will be taught how to prepare and give this medicine. Refer to the Instructions for Use that come with your medication packaging. Use exactly as directed. Take your medicine at regular intervals. Do not take your medicine more often than directed. It is important that you put your used needles and syringes in a special sharps container. Do not put them in a trash can. If you do not have a sharps container, call your pharmacist or healthcare provider to get one. Talk to your pediatrician regarding the use of this medicine in children. Special care may be needed. Overdosage: If you think you have taken too much of this medicine contact a poison control center or emergency room at once. NOTE: This medicine is only for you. Do not share this medicine with others. What if I miss a dose? It is important not to miss your dose. Call your doctor or health care professional if you miss a dose. What may interact with this medicine? This  medicine may interact with the following medications: -medicines that may cause a release of neutrophils, such as lithium This list may not describe all possible interactions. Give your health care provider a list of all the medicines, herbs, non-prescription drugs, or dietary supplements you use. Also tell them if you smoke, drink alcohol, or use illegal drugs. Some items may interact with your medicine. What should I watch for while using this medicine? You may need blood work done while you are taking this medicine. What side effects may I notice from receiving this medicine? Side effects that you should report to your doctor or health care professional as soon as possible: -allergic reactions like skin rash, itching or hives, swelling of the face, lips, or tongue -shortness of breath or breathing problems -fever -pain, redness, or irritation at site where injected -pinpoint red spots on the skin -stomach or side pain, or pain at the shoulder -swelling -tiredness -trouble passing urine Side effects that usually do not require medical attention (Report these to your doctor or health care professional if they continue or are bothersome.): -bone pain -muscle pain This list may not describe all possible side effects. Call your doctor for medical advice about side effects. You may report side effects to FDA at 1-800-FDA-1088. Where should I keep my medicine? Keep out of the reach of children. Store in a refrigerator between 2 and 8 degrees C (36 and 46 degrees F). Keep in carton to protect from light. Throw away this medicine if it is left out of the refrigerator for more than 5 consecutive days. Throw away any unused   medicine after the expiration date. NOTE: This sheet is a summary. It may not cover all possible information. If you have questions about this medicine, talk to your doctor, pharmacist, or health care provider.    2016, Elsevier/Gold Standard. (2013-05-24  11:52:29) Pegfilgrastim injection What is this medicine? PEGFILGRASTIM (PEG fil gra stim) is a long-acting granulocyte colony-stimulating factor that stimulates the growth of neutrophils, a type of white blood cell important in the body's fight against infection. It is used to reduce the incidence of fever and infection in patients with certain types of cancer who are receiving chemotherapy that affects the bone marrow, and to increase survival after being exposed to high doses of radiation. This medicine may be used for other purposes; ask your health care provider or pharmacist if you have questions. What should I tell my health care provider before I take this medicine? They need to know if you have any of these conditions: -kidney disease -latex allergy -ongoing radiation therapy -sickle cell disease -skin reactions to acrylic adhesives (On-Body Injector only) -an unusual or allergic reaction to pegfilgrastim, filgrastim, other medicines, foods, dyes, or preservatives -pregnant or trying to get pregnant -breast-feeding How should I use this medicine? This medicine is for injection under the skin. If you get this medicine at home, you will be taught how to prepare and give the pre-filled syringe or how to use the On-body Injector. Refer to the patient Instructions for Use for detailed instructions. Use exactly as directed. Take your medicine at regular intervals. Do not take your medicine more often than directed. It is important that you put your used needles and syringes in a special sharps container. Do not put them in a trash can. If you do not have a sharps container, call your pharmacist or healthcare provider to get one. Talk to your pediatrician regarding the use of this medicine in children. While this drug may be prescribed for selected conditions, precautions do apply. Overdosage: If you think you have taken too much of this medicine contact a poison control center or emergency room  at once. NOTE: This medicine is only for you. Do not share this medicine with others. What if I miss a dose? It is important not to miss your dose. Call your doctor or health care professional if you miss your dose. If you miss a dose due to an On-body Injector failure or leakage, a new dose should be administered as soon as possible using a single prefilled syringe for manual use. What may interact with this medicine? Interactions have not been studied. Give your health care provider a list of all the medicines, herbs, non-prescription drugs, or dietary supplements you use. Also tell them if you smoke, drink alcohol, or use illegal drugs. Some items may interact with your medicine. This list may not describe all possible interactions. Give your health care provider a list of all the medicines, herbs, non-prescription drugs, or dietary supplements you use. Also tell them if you smoke, drink alcohol, or use illegal drugs. Some items may interact with your medicine. What should I watch for while using this medicine? You may need blood work done while you are taking this medicine. If you are going to need a MRI, CT scan, or other procedure, tell your doctor that you are using this medicine (On-Body Injector only). What side effects may I notice from receiving this medicine? Side effects that you should report to your doctor or health care professional as soon as possible: -allergic reactions like skin  rash, itching or hives, swelling of the face, lips, or tongue -dizziness -fever -pain, redness, or irritation at site where injected -pinpoint red spots on the skin -red or dark-brown urine -shortness of breath or breathing problems -stomach or side pain, or pain at the shoulder -swelling -tiredness -trouble passing urine or change in the amount of urine Side effects that usually do not require medical attention (report to your doctor or health care professional if they continue or are  bothersome): -bone pain -muscle pain This list may not describe all possible side effects. Call your doctor for medical advice about side effects. You may report side effects to FDA at 1-800-FDA-1088. Where should I keep my medicine? Keep out of the reach of children. Store pre-filled syringes in a refrigerator between 2 and 8 degrees C (36 and 46 degrees F). Do not freeze. Keep in carton to protect from light. Throw away this medicine if it is left out of the refrigerator for more than 48 hours. Throw away any unused medicine after the expiration date. NOTE: This sheet is a summary. It may not cover all possible information. If you have questions about this medicine, talk to your doctor, pharmacist, or health care provider.    2016, Elsevier/Gold Standard. (2014-02-21 14:30:14)

## 2015-08-12 NOTE — Telephone Encounter (Signed)
lvm per Dr LL attached message. 

## 2015-08-12 NOTE — Telephone Encounter (Signed)
-----   Message from Gordy Levan, MD sent at 08/12/2015  1:40 PM EDT ----- Labs seen and need follow up: please let her know marker good 18.7

## 2015-08-13 ENCOUNTER — Other Ambulatory Visit: Payer: Self-pay | Admitting: Oncology

## 2015-08-13 ENCOUNTER — Ambulatory Visit (HOSPITAL_BASED_OUTPATIENT_CLINIC_OR_DEPARTMENT_OTHER): Payer: BLUE CROSS/BLUE SHIELD

## 2015-08-13 VITALS — BP 101/51 | HR 78 | Temp 98.1°F | Resp 18

## 2015-08-13 DIAGNOSIS — T451X5A Adverse effect of antineoplastic and immunosuppressive drugs, initial encounter: Principal | ICD-10-CM

## 2015-08-13 DIAGNOSIS — D701 Agranulocytosis secondary to cancer chemotherapy: Secondary | ICD-10-CM

## 2015-08-13 DIAGNOSIS — C762 Malignant neoplasm of abdomen: Secondary | ICD-10-CM

## 2015-08-13 MED ORDER — TBO-FILGRASTIM 480 MCG/0.8ML ~~LOC~~ SOSY
480.0000 ug | PREFILLED_SYRINGE | Freq: Once | SUBCUTANEOUS | Status: AC
  Administered 2015-08-13: 480 ug via SUBCUTANEOUS
  Filled 2015-08-13: qty 0.8

## 2015-08-13 NOTE — Patient Instructions (Signed)
Tbo-Filgrastim injection What is this medicine? TBO-FILGRASTIM (T B O fil GRA stim) is a granulocyte colony-stimulating factor that stimulates the growth of neutrophils, a type of white blood cell important in the body's fight against infection. It is used to reduce the incidence of fever and infection in patients with certain types of cancer who are receiving chemotherapy that affects the bone marrow. This medicine may be used for other purposes; ask your health care provider or pharmacist if you have questions. What should I tell my health care provider before I take this medicine? They need to know if you have any of these conditions: -ongoing radiation therapy -sickle cell anemia -an unusual or allergic reaction to tbo-filgrastim, filgrastim, pegfilgrastim, other medicines, foods, dyes, or preservatives -pregnant or trying to get pregnant -breast-feeding How should I use this medicine? This medicine is for injection under the skin. If you get this medicine at home, you will be taught how to prepare and give this medicine. Refer to the Instructions for Use that come with your medication packaging. Use exactly as directed. Take your medicine at regular intervals. Do not take your medicine more often than directed. It is important that you put your used needles and syringes in a special sharps container. Do not put them in a trash can. If you do not have a sharps container, call your pharmacist or healthcare provider to get one. Talk to your pediatrician regarding the use of this medicine in children. Special care may be needed. Overdosage: If you think you have taken too much of this medicine contact a poison control center or emergency room at once. NOTE: This medicine is only for you. Do not share this medicine with others. What if I miss a dose? It is important not to miss your dose. Call your doctor or health care professional if you miss a dose. What may interact with this medicine? This  medicine may interact with the following medications: -medicines that may cause a release of neutrophils, such as lithium This list may not describe all possible interactions. Give your health care provider a list of all the medicines, herbs, non-prescription drugs, or dietary supplements you use. Also tell them if you smoke, drink alcohol, or use illegal drugs. Some items may interact with your medicine. What should I watch for while using this medicine? You may need blood work done while you are taking this medicine. What side effects may I notice from receiving this medicine? Side effects that you should report to your doctor or health care professional as soon as possible: -allergic reactions like skin rash, itching or hives, swelling of the face, lips, or tongue -shortness of breath or breathing problems -fever -pain, redness, or irritation at site where injected -pinpoint red spots on the skin -stomach or side pain, or pain at the shoulder -swelling -tiredness -trouble passing urine Side effects that usually do not require medical attention (Report these to your doctor or health care professional if they continue or are bothersome.): -bone pain -muscle pain This list may not describe all possible side effects. Call your doctor for medical advice about side effects. You may report side effects to FDA at 1-800-FDA-1088. Where should I keep my medicine? Keep out of the reach of children. Store in a refrigerator between 2 and 8 degrees C (36 and 46 degrees F). Keep in carton to protect from light. Throw away this medicine if it is left out of the refrigerator for more than 5 consecutive days. Throw away any unused   medicine after the expiration date. NOTE: This sheet is a summary. It may not cover all possible information. If you have questions about this medicine, talk to your doctor, pharmacist, or health care provider.    2016, Elsevier/Gold Standard. (2013-05-24  11:52:29) Pegfilgrastim injection What is this medicine? PEGFILGRASTIM (PEG fil gra stim) is a long-acting granulocyte colony-stimulating factor that stimulates the growth of neutrophils, a type of white blood cell important in the body's fight against infection. It is used to reduce the incidence of fever and infection in patients with certain types of cancer who are receiving chemotherapy that affects the bone marrow, and to increase survival after being exposed to high doses of radiation. This medicine may be used for other purposes; ask your health care provider or pharmacist if you have questions. What should I tell my health care provider before I take this medicine? They need to know if you have any of these conditions: -kidney disease -latex allergy -ongoing radiation therapy -sickle cell disease -skin reactions to acrylic adhesives (On-Body Injector only) -an unusual or allergic reaction to pegfilgrastim, filgrastim, other medicines, foods, dyes, or preservatives -pregnant or trying to get pregnant -breast-feeding How should I use this medicine? This medicine is for injection under the skin. If you get this medicine at home, you will be taught how to prepare and give the pre-filled syringe or how to use the On-body Injector. Refer to the patient Instructions for Use for detailed instructions. Use exactly as directed. Take your medicine at regular intervals. Do not take your medicine more often than directed. It is important that you put your used needles and syringes in a special sharps container. Do not put them in a trash can. If you do not have a sharps container, call your pharmacist or healthcare provider to get one. Talk to your pediatrician regarding the use of this medicine in children. While this drug may be prescribed for selected conditions, precautions do apply. Overdosage: If you think you have taken too much of this medicine contact a poison control center or emergency room  at once. NOTE: This medicine is only for you. Do not share this medicine with others. What if I miss a dose? It is important not to miss your dose. Call your doctor or health care professional if you miss your dose. If you miss a dose due to an On-body Injector failure or leakage, a new dose should be administered as soon as possible using a single prefilled syringe for manual use. What may interact with this medicine? Interactions have not been studied. Give your health care provider a list of all the medicines, herbs, non-prescription drugs, or dietary supplements you use. Also tell them if you smoke, drink alcohol, or use illegal drugs. Some items may interact with your medicine. This list may not describe all possible interactions. Give your health care provider a list of all the medicines, herbs, non-prescription drugs, or dietary supplements you use. Also tell them if you smoke, drink alcohol, or use illegal drugs. Some items may interact with your medicine. What should I watch for while using this medicine? You may need blood work done while you are taking this medicine. If you are going to need a MRI, CT scan, or other procedure, tell your doctor that you are using this medicine (On-Body Injector only). What side effects may I notice from receiving this medicine? Side effects that you should report to your doctor or health care professional as soon as possible: -allergic reactions like skin  rash, itching or hives, swelling of the face, lips, or tongue -dizziness -fever -pain, redness, or irritation at site where injected -pinpoint red spots on the skin -red or dark-brown urine -shortness of breath or breathing problems -stomach or side pain, or pain at the shoulder -swelling -tiredness -trouble passing urine or change in the amount of urine Side effects that usually do not require medical attention (report to your doctor or health care professional if they continue or are  bothersome): -bone pain -muscle pain This list may not describe all possible side effects. Call your doctor for medical advice about side effects. You may report side effects to FDA at 1-800-FDA-1088. Where should I keep my medicine? Keep out of the reach of children. Store pre-filled syringes in a refrigerator between 2 and 8 degrees C (36 and 46 degrees F). Do not freeze. Keep in carton to protect from light. Throw away this medicine if it is left out of the refrigerator for more than 48 hours. Throw away any unused medicine after the expiration date. NOTE: This sheet is a summary. It may not cover all possible information. If you have questions about this medicine, talk to your doctor, pharmacist, or health care provider.    2016, Elsevier/Gold Standard. (2014-02-21 14:30:14)

## 2015-08-14 ENCOUNTER — Ambulatory Visit (HOSPITAL_BASED_OUTPATIENT_CLINIC_OR_DEPARTMENT_OTHER): Payer: BLUE CROSS/BLUE SHIELD

## 2015-08-14 VITALS — BP 106/67 | HR 68 | Temp 98.2°F | Resp 20

## 2015-08-14 DIAGNOSIS — C5701 Malignant neoplasm of right fallopian tube: Secondary | ICD-10-CM

## 2015-08-14 DIAGNOSIS — T451X5A Adverse effect of antineoplastic and immunosuppressive drugs, initial encounter: Principal | ICD-10-CM

## 2015-08-14 DIAGNOSIS — D701 Agranulocytosis secondary to cancer chemotherapy: Secondary | ICD-10-CM

## 2015-08-14 DIAGNOSIS — C762 Malignant neoplasm of abdomen: Secondary | ICD-10-CM

## 2015-08-14 MED ORDER — TBO-FILGRASTIM 480 MCG/0.8ML ~~LOC~~ SOSY
480.0000 ug | PREFILLED_SYRINGE | Freq: Once | SUBCUTANEOUS | Status: AC
  Administered 2015-08-14: 480 ug via SUBCUTANEOUS
  Filled 2015-08-14: qty 0.8

## 2015-08-14 NOTE — Patient Instructions (Signed)
Tbo-Filgrastim injection What is this medicine? TBO-FILGRASTIM (T B O fil GRA stim) is a granulocyte colony-stimulating factor that stimulates the growth of neutrophils, a type of white blood cell important in the body's fight against infection. It is used to reduce the incidence of fever and infection in patients with certain types of cancer who are receiving chemotherapy that affects the bone marrow. This medicine may be used for other purposes; ask your health care provider or pharmacist if you have questions. What should I tell my health care provider before I take this medicine? They need to know if you have any of these conditions: -ongoing radiation therapy -sickle cell anemia -an unusual or allergic reaction to tbo-filgrastim, filgrastim, pegfilgrastim, other medicines, foods, dyes, or preservatives -pregnant or trying to get pregnant -breast-feeding How should I use this medicine? This medicine is for injection under the skin. If you get this medicine at home, you will be taught how to prepare and give this medicine. Refer to the Instructions for Use that come with your medication packaging. Use exactly as directed. Take your medicine at regular intervals. Do not take your medicine more often than directed. It is important that you put your used needles and syringes in a special sharps container. Do not put them in a trash can. If you do not have a sharps container, call your pharmacist or healthcare provider to get one. Talk to your pediatrician regarding the use of this medicine in children. Special care may be needed. Overdosage: If you think you have taken too much of this medicine contact a poison control center or emergency room at once. NOTE: This medicine is only for you. Do not share this medicine with others. What if I miss a dose? It is important not to miss your dose. Call your doctor or health care professional if you miss a dose. What may interact with this medicine? This  medicine may interact with the following medications: -medicines that may cause a release of neutrophils, such as lithium This list may not describe all possible interactions. Give your health care provider a list of all the medicines, herbs, non-prescription drugs, or dietary supplements you use. Also tell them if you smoke, drink alcohol, or use illegal drugs. Some items may interact with your medicine. What should I watch for while using this medicine? You may need blood work done while you are taking this medicine. What side effects may I notice from receiving this medicine? Side effects that you should report to your doctor or health care professional as soon as possible: -allergic reactions like skin rash, itching or hives, swelling of the face, lips, or tongue -shortness of breath or breathing problems -fever -pain, redness, or irritation at site where injected -pinpoint red spots on the skin -stomach or side pain, or pain at the shoulder -swelling -tiredness -trouble passing urine Side effects that usually do not require medical attention (Report these to your doctor or health care professional if they continue or are bothersome.): -bone pain -muscle pain This list may not describe all possible side effects. Call your doctor for medical advice about side effects. You may report side effects to FDA at 1-800-FDA-1088. Where should I keep my medicine? Keep out of the reach of children. Store in a refrigerator between 2 and 8 degrees C (36 and 46 degrees F). Keep in carton to protect from light. Throw away this medicine if it is left out of the refrigerator for more than 5 consecutive days. Throw away any unused   medicine after the expiration date. NOTE: This sheet is a summary. It may not cover all possible information. If you have questions about this medicine, talk to your doctor, pharmacist, or health care provider.    2016, Elsevier/Gold Standard. (2013-05-24  11:52:29) Pegfilgrastim injection What is this medicine? PEGFILGRASTIM (PEG fil gra stim) is a long-acting granulocyte colony-stimulating factor that stimulates the growth of neutrophils, a type of white blood cell important in the body's fight against infection. It is used to reduce the incidence of fever and infection in patients with certain types of cancer who are receiving chemotherapy that affects the bone marrow, and to increase survival after being exposed to high doses of radiation. This medicine may be used for other purposes; ask your health care provider or pharmacist if you have questions. What should I tell my health care provider before I take this medicine? They need to know if you have any of these conditions: -kidney disease -latex allergy -ongoing radiation therapy -sickle cell disease -skin reactions to acrylic adhesives (On-Body Injector only) -an unusual or allergic reaction to pegfilgrastim, filgrastim, other medicines, foods, dyes, or preservatives -pregnant or trying to get pregnant -breast-feeding How should I use this medicine? This medicine is for injection under the skin. If you get this medicine at home, you will be taught how to prepare and give the pre-filled syringe or how to use the On-body Injector. Refer to the patient Instructions for Use for detailed instructions. Use exactly as directed. Take your medicine at regular intervals. Do not take your medicine more often than directed. It is important that you put your used needles and syringes in a special sharps container. Do not put them in a trash can. If you do not have a sharps container, call your pharmacist or healthcare provider to get one. Talk to your pediatrician regarding the use of this medicine in children. While this drug may be prescribed for selected conditions, precautions do apply. Overdosage: If you think you have taken too much of this medicine contact a poison control center or emergency room  at once. NOTE: This medicine is only for you. Do not share this medicine with others. What if I miss a dose? It is important not to miss your dose. Call your doctor or health care professional if you miss your dose. If you miss a dose due to an On-body Injector failure or leakage, a new dose should be administered as soon as possible using a single prefilled syringe for manual use. What may interact with this medicine? Interactions have not been studied. Give your health care provider a list of all the medicines, herbs, non-prescription drugs, or dietary supplements you use. Also tell them if you smoke, drink alcohol, or use illegal drugs. Some items may interact with your medicine. This list may not describe all possible interactions. Give your health care provider a list of all the medicines, herbs, non-prescription drugs, or dietary supplements you use. Also tell them if you smoke, drink alcohol, or use illegal drugs. Some items may interact with your medicine. What should I watch for while using this medicine? You may need blood work done while you are taking this medicine. If you are going to need a MRI, CT scan, or other procedure, tell your doctor that you are using this medicine (On-Body Injector only). What side effects may I notice from receiving this medicine? Side effects that you should report to your doctor or health care professional as soon as possible: -allergic reactions like skin  rash, itching or hives, swelling of the face, lips, or tongue -dizziness -fever -pain, redness, or irritation at site where injected -pinpoint red spots on the skin -red or dark-brown urine -shortness of breath or breathing problems -stomach or side pain, or pain at the shoulder -swelling -tiredness -trouble passing urine or change in the amount of urine Side effects that usually do not require medical attention (report to your doctor or health care professional if they continue or are  bothersome): -bone pain -muscle pain This list may not describe all possible side effects. Call your doctor for medical advice about side effects. You may report side effects to FDA at 1-800-FDA-1088. Where should I keep my medicine? Keep out of the reach of children. Store pre-filled syringes in a refrigerator between 2 and 8 degrees C (36 and 46 degrees F). Do not freeze. Keep in carton to protect from light. Throw away this medicine if it is left out of the refrigerator for more than 48 hours. Throw away any unused medicine after the expiration date. NOTE: This sheet is a summary. It may not cover all possible information. If you have questions about this medicine, talk to your doctor, pharmacist, or health care provider.    2016, Elsevier/Gold Standard. (2014-02-21 14:30:14)

## 2015-08-15 ENCOUNTER — Telehealth: Payer: Self-pay

## 2015-08-15 DIAGNOSIS — C5701 Malignant neoplasm of right fallopian tube: Secondary | ICD-10-CM

## 2015-08-15 MED ORDER — DEXAMETHASONE 4 MG PO TABS
ORAL_TABLET | ORAL | Status: DC
Start: 2015-08-15 — End: 2015-11-26

## 2015-08-15 NOTE — Telephone Encounter (Signed)
Pt called with 3 questions. 1) needs steroid filled for next treatment - done. 2) will Dr [name] consider giving her po benadryl rather than IV with the treatment. The benadryl makes her extremely anxious, gives her restless leg, her heart races and she cannot relax. Even with the slower lower dosage.  She has taken PO benadryl in past for bee stings. She does have milder reaction when taking po benadryl. 3) she is having the tingling in her face and neck again with the taxol. It is not as dramatic as the prior time she took taxol. She wanted Dr [name] to be aware.

## 2015-08-17 ENCOUNTER — Other Ambulatory Visit: Payer: Self-pay | Admitting: Oncology

## 2015-08-18 ENCOUNTER — Telehealth: Payer: Self-pay | Admitting: Oncology

## 2015-08-18 ENCOUNTER — Ambulatory Visit (HOSPITAL_BASED_OUTPATIENT_CLINIC_OR_DEPARTMENT_OTHER): Payer: BLUE CROSS/BLUE SHIELD

## 2015-08-18 ENCOUNTER — Other Ambulatory Visit (HOSPITAL_BASED_OUTPATIENT_CLINIC_OR_DEPARTMENT_OTHER): Payer: BLUE CROSS/BLUE SHIELD

## 2015-08-18 ENCOUNTER — Ambulatory Visit (HOSPITAL_BASED_OUTPATIENT_CLINIC_OR_DEPARTMENT_OTHER): Payer: BLUE CROSS/BLUE SHIELD | Admitting: Oncology

## 2015-08-18 ENCOUNTER — Encounter: Payer: Self-pay | Admitting: Oncology

## 2015-08-18 VITALS — BP 116/76 | HR 89 | Temp 98.0°F | Resp 18 | Ht 62.0 in | Wt 121.8 lb

## 2015-08-18 DIAGNOSIS — D701 Agranulocytosis secondary to cancer chemotherapy: Secondary | ICD-10-CM

## 2015-08-18 DIAGNOSIS — Z5111 Encounter for antineoplastic chemotherapy: Secondary | ICD-10-CM

## 2015-08-18 DIAGNOSIS — G2581 Restless legs syndrome: Secondary | ICD-10-CM

## 2015-08-18 DIAGNOSIS — C5701 Malignant neoplasm of right fallopian tube: Secondary | ICD-10-CM

## 2015-08-18 DIAGNOSIS — G620 Drug-induced polyneuropathy: Secondary | ICD-10-CM

## 2015-08-18 DIAGNOSIS — C762 Malignant neoplasm of abdomen: Secondary | ICD-10-CM

## 2015-08-18 DIAGNOSIS — T451X5A Adverse effect of antineoplastic and immunosuppressive drugs, initial encounter: Secondary | ICD-10-CM

## 2015-08-18 LAB — CBC WITH DIFFERENTIAL/PLATELET
BASO%: 0 % (ref 0.0–2.0)
Basophils Absolute: 0 10*3/uL (ref 0.0–0.1)
EOS%: 0 % (ref 0.0–7.0)
Eosinophils Absolute: 0 10*3/uL (ref 0.0–0.5)
HCT: 33.1 % — ABNORMAL LOW (ref 34.8–46.6)
HGB: 11.7 g/dL (ref 11.6–15.9)
LYMPH%: 17.4 % (ref 14.0–49.7)
MCH: 33.6 pg (ref 25.1–34.0)
MCHC: 35.3 g/dL (ref 31.5–36.0)
MCV: 95.1 fL (ref 79.5–101.0)
MONO#: 0 10*3/uL — ABNORMAL LOW (ref 0.1–0.9)
MONO%: 0.5 % (ref 0.0–14.0)
NEUT#: 1.7 10*3/uL (ref 1.5–6.5)
NEUT%: 82.1 % — ABNORMAL HIGH (ref 38.4–76.8)
Platelets: 246 10*3/uL (ref 145–400)
RBC: 3.48 10*6/uL — ABNORMAL LOW (ref 3.70–5.45)
RDW: 12.9 % (ref 11.2–14.5)
WBC: 2.1 10*3/uL — ABNORMAL LOW (ref 3.9–10.3)
lymph#: 0.4 10*3/uL — ABNORMAL LOW (ref 0.9–3.3)
nRBC: 0 % (ref 0–0)

## 2015-08-18 LAB — COMPREHENSIVE METABOLIC PANEL
ALT: 19 U/L (ref 0–55)
AST: 17 U/L (ref 5–34)
Albumin: 4.6 g/dL (ref 3.5–5.0)
Alkaline Phosphatase: 73 U/L (ref 40–150)
Anion Gap: 12 mEq/L — ABNORMAL HIGH (ref 3–11)
BUN: 13.7 mg/dL (ref 7.0–26.0)
CO2: 24 mEq/L (ref 22–29)
Calcium: 10 mg/dL (ref 8.4–10.4)
Chloride: 101 mEq/L (ref 98–109)
Creatinine: 0.7 mg/dL (ref 0.6–1.1)
EGFR: >90 mL/min/{1.73_m2} (ref >90)
Glucose: 123 mg/dl (ref 70–140)
Potassium: 3.7 mEq/L (ref 3.5–5.1)
Sodium: 138 mEq/L (ref 136–145)
Total Bilirubin: 0.59 mg/dL (ref 0.20–1.20)
Total Protein: 8.2 g/dL (ref 6.4–8.3)

## 2015-08-18 MED ORDER — DIPHENHYDRAMINE HCL 25 MG PO CAPS
ORAL_CAPSULE | ORAL | Status: AC
  Filled 2015-08-18: qty 1

## 2015-08-18 MED ORDER — SODIUM CHLORIDE 0.9 % IV SOLN
Freq: Once | INTRAVENOUS | Status: AC
  Administered 2015-08-18: 15:00:00 via INTRAVENOUS
  Filled 2015-08-18: qty 4

## 2015-08-18 MED ORDER — SODIUM CHLORIDE 0.9 % IV SOLN
Freq: Once | INTRAVENOUS | Status: AC
  Administered 2015-08-18: 13:00:00 via INTRAVENOUS

## 2015-08-18 MED ORDER — DIPHENHYDRAMINE HCL 25 MG PO CAPS
25.0000 mg | ORAL_CAPSULE | Freq: Once | ORAL | Status: AC
  Administered 2015-08-18: 25 mg via ORAL

## 2015-08-18 MED ORDER — FAMOTIDINE IN NACL 20-0.9 MG/50ML-% IV SOLN
INTRAVENOUS | Status: AC
  Filled 2015-08-18: qty 50

## 2015-08-18 MED ORDER — SODIUM CHLORIDE 0.9 % IV SOLN: 20.0000 mg | Freq: Once | INTRAVENOUS | Status: DC

## 2015-08-18 MED ORDER — FAMOTIDINE IN NACL 20-0.9 MG/50ML-% IV SOLN
20.0000 mg | Freq: Once | INTRAVENOUS | Status: AC
  Administered 2015-08-18: 20 mg via INTRAVENOUS

## 2015-08-18 MED ORDER — SODIUM CHLORIDE 0.9 % IV SOLN: Freq: Once | INTRAVENOUS | Status: DC

## 2015-08-18 MED ORDER — SODIUM CHLORIDE 0.9 % IV SOLN
80.0000 mg/m2 | Freq: Once | INTRAVENOUS | Status: AC
  Administered 2015-08-18: 126 mg via INTRAVENOUS
  Filled 2015-08-18: qty 21

## 2015-08-18 NOTE — Progress Notes (Signed)
OFFICE PROGRESS NOTE   August 19, 2015   Physicians: [name], Harlan Stains, MD, 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 Due to neurologic symptoms, cycle 5 was carboplatin only. Taxol was resumed with present cycle 6, using weekly dosing rather than the q 3 week dosing used cycles 1-4. Patient reports slight increase in the neuropathy symptoms since day 1 cycle 6 on 08-11-15. She had granix 480 mcg on 6-27, 28, 29.  Patient reports tingling bilateral lower face, posterior neck and medial to right scapula beginning ~ day 3,  not as severe as previously but moreso than just prior to 6-26 treatment. She also notices brief numbness in thighs and shins lasting perhaps 30 sec when she gets out of car. She has no numbness in LE now, no numbness in hands or feet, no other neurologic symptoms. She needed antiemetics for ~ 2 days after chemo, now eating well. Bowels are not moving as well last few days, has increased usual laxatives. No abdominal or pelvic discomfort. Walked at least a mile briskly yesterday. No fever. Restless with benadryl IV so have changed that to po with chemo premeds. No fever or symptoms of infection. No problems with peripheral IV access. No bleeding. No LE swelling. Not severe aching with granix Remainder of 10 point Review of Systems negative.    CA 125 1226 on 03-20-2015 No central line Genetics testing negative by Breast Ovarian panel by [facility] sent 05-05-15, tho VUS with PALB2.  ONCOLOGIC HISTORY Patient has been generally healthy, post vaginal hysterectomy for fibroids and menorrhagia 2012. In ~ 11-2014 she noticed bladder pressure and occasional trace blood ? Vaginal vs urinary. She was seen by Dr Pilar Jarvis, with cystoscopy 01-31-15 suggesting extrinsic  compression of bladder. She had CT AP at [facility] 03-04-14, with moderate ascites, omental caking in lateral left abdomen and pelvis, absent uterus, "ovaries are likely visualized" , no adenopathy, no hydronephrosis or renal stones, lung bases clear, hemangioma posterior right hepatic lobe (CT report to be scanned into this EMR). CA 125 by Dr Collene Mares 03-06-15 was 957, with CEA 1.0. She had colonoscopy by Dr Collene Mares on 03-12-15, reportedly unremarkable. She had consultation with Dr Denman George on 03-17-15, with plan for initial surgery if amenable to optimal debulking, otherwise to begin with neoadjuvant chemotherapy. CT chest 03-21-15 no findings of concern. She had exploratory laparotomy with BSO, omentectomy and radical R0 debulking on 03-27-15, pathology ([id number]) high grade serous carcinoma involving bilateral tubes and ovaries, omentum and peritoneal nodule, final diagnosis IIIC right fallopian. She began chemotherapy with carboplatin and taxol on 04-14-15. She was neutropenic by day 8 cycle 1, ANC 0.9. Cycle 5 was delayed with unusual neuropathy symptoms and cytopenias, given as carboplatin only on 07-21-15 with neulasta. Cycle 6 began 08-11-15 using weekly taxol and granix support.   Objective:  Vital signs in last 24 hours:  BP 116/76 mmHg  Pulse 89  Temp(Src) 98 F (36.7 C) (Oral)  Resp 18  Ht _0  (1.575 m)  Wt 121 lb 12.8 oz (55.248 kg)  BMI 22.27 kg/m2  SpO2 100% Weight down 3 lbs Alert, oriented and appropriate. Ambulatory quickly and without difficulty.  Complete alopecia  HEENT:PERRL, sclerae not icteric. Oral mucosa moist without lesions, posterior pharynx clear.  Neck supple. No JVD.  Lymphatics:no cervical,supraclavicular, axillary or inguinal  adenopathy Resp: clear to auscultation bilaterally and normal percussion bilaterally Cardio: regular rate and rhythm. No gallop. GI: soft, nontender, not distended, no mass or organomegaly. Normally active bowel sounds. Surgical incision not  remarkable. Musculoskeletal/ Extremities: without pitting edema, cords, tenderness Neuro: peripheral neuropathy as described. Speech fluent and appropriate, no facial droop, tongue midline no fasciculations. Moves extremities equally. Gait unremarkable. Otherwise nonfocal. PSYCH appropriate mood and affect Skin without rash, ecchymosis, petechiae  Lab Results:  Results for orders placed or performed in visit on 08/18/15  CBC with Differential  Result Value Ref Range   WBC 2.1 (L) 3.9 - 10.3 10e3/uL   NEUT# 1.7 1.5 - 6.5 10e3/uL   HGB 11.7 11.6 - 15.9 g/dL   HCT 33.1 (L) 34.8 - 46.6 %   Platelets 246 145 - 400 10e3/uL   MCV 95.1 79.5 - 101.0 fL   MCH 33.6 25.1 - 34.0 pg   MCHC 35.3 31.5 - 36.0 g/dL   RBC 3.48 (L) 3.70 - 5.45 10e6/uL   RDW 12.9 11.2 - 14.5 %   lymph# 0.4 (L) 0.9 - 3.3 10e3/uL   MONO# 0.0 (L) 0.1 - 0.9 10e3/uL   Eosinophils Absolute 0.0 0.0 - 0.5 10e3/uL   Basophils Absolute 0.0 0.0 - 0.1 10e3/uL   NEUT% 82.1 (H) 38.4 - 76.8 %   LYMPH% 17.4 14.0 - 49.7 %   MONO% 0.5 0.0 - 14.0 %   EOS% 0.0 0.0 - 7.0 %   BASO% 0.0 0.0 - 2.0 %   nRBC 0 0 - 0 %  Comprehensive metabolic panel  Result Value Ref Range   Sodium 138 136 - 145 mEq/L   Potassium 3.7 3.5 - 5.1 mEq/L   Chloride 101 98 - 109 mEq/L   CO2 24 22 - 29 mEq/L   Glucose 123 70 - 140 mg/dl   BUN 13.7 7.0 - 26.0 mg/dL   Creatinine 0.7 0.6 - 1.1 mg/dL   Total Bilirubin 0.59 0.20 - 1.20 mg/dL   Alkaline Phosphatase 73 40 - 150 U/L   AST 17 5 - 34 U/L   ALT 19 0 - 55 U/L   Total Protein 8.2 6.4 - 8.3 g/dL   Albumin 4.6 3.5 - 5.0 g/dL   Calcium 10.0 8.4 - 10.4 mg/dL   Anion Gap 12 (H) 3 - 11 mEq/L   EGFR >90 >90 ml/min/1.73 m2    CA 125 on 08-11-15 18.7 , this having been 19.9 on 07-08-15 and 43.9 on 05-22-15.   Marker information discussed.  Studies/Results:  No results found.  Medications: I have reviewed the patient's current medications. Granix also x2 after day 8 today.  She continues gabapentin. Benadryl  changed to 25 mg po >30 min prior to chemo  DISCUSSION Patient and family understand that continuing taxol even at low dose may exacerbate the neuropathy symptoms, however patient feels that these are not really bothersome at this point and is in favor of continuing taxol given cancer treatment indication. She will call if any concerns, otherwise will call this office with update on 08-22-15 prior to planned day 15 cycle 6 on 08-25-15. If symptoms still fairly minimal, expect to give day 15 cycle 6 on 08-25-15 as planned.  Will give granix x 2 also this week.  We did not discuss additional 3 weeks of low dose taxol after this cycle (as taxol omitted from cycle 5), which will depend on neurologic symptoms and patient's willingness to take additional doses of chemo (beach trip).  Plan restaging  scans and follow up with Dr Denman George after chemo completes.    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 Adjuvant carboplatin taxol begun 04-14-15, neutropenic day 8 cycle 2. 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 Cycle 5 delayed x 2 weeks with neurologic concerns and cytopenias, given as [place] only on 07-21-15. Using weekly taxol with this cycle 6 tho would hold if significant worsening in neuropathy. Granix support at increased dose. 2. Paresthesias in face and jaw, left shoulder/ LUE: first noticed week prior to #4 chemo, then increased after #4 chemo. 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 Symptoms still minimal, benefit vs risk of taxol considered and will continue with day 8 dose today. Plan for day 15 as noted. 3.chemo neutropenia: granix at 480 mcg x3 after day 1, x2 this week, and will need also after day 15 if that treatment given. 4.irritable bowel syndrome x years, known to Dr Collene Mares. Would not use probiotics while neutropenic. Use laxatives prn with  chemo. 5. Peripheral IV access still adequate 6.heterogeneously dense breast tissue including most recent 3D mammograms at [place] 03-26-15 7. Post cholecystecytomy 8.surgery for crushed left femur age 10 9. post hysterectomy for benign indications 2012. History of endometriosis. 10. Restlessness with IV benadryl used as standard taxol premedication. Change to po, dose already reduced to 25 mg.   All questions answered and patient/ family in agreement with recommendations above. Husband expresses appreciation for care. Chemo and granix orders placed/ confirmed. Time spent 25 min including >50% counseling and coordination of care. Route PCP, cc Drs Jannifer Franklin and Bartholomew Boards, MD   08/19/2015, 3:01 PM

## 2015-08-18 NOTE — Telephone Encounter (Signed)
per pof to sch pt appt-CX appts added inj-gave pt copy of avs

## 2015-08-18 NOTE — Patient Instructions (Signed)
[facility] Discharge Instructions for Patients Receiving Chemotherapy  Today you received the following chemotherapy agents: Taxol.  To help prevent nausea and vomiting after your treatment, we encourage you to take your nausea medication: Zofran 8 mg every 8 hours as needed.   If you develop nausea and vomiting that is not controlled by your nausea medication, call the clinic.   BELOW ARE SYMPTOMS THAT SHOULD BE REPORTED IMMEDIATELY:  *FEVER GREATER THAN 100.5 F  *CHILLS WITH OR WITHOUT FEVER  NAUSEA AND VOMITING THAT IS NOT CONTROLLED WITH YOUR NAUSEA MEDICATION  *UNUSUAL SHORTNESS OF BREATH  *UNUSUAL BRUISING OR BLEEDING  TENDERNESS IN MOUTH AND THROAT WITH OR WITHOUT PRESENCE OF ULCERS  *URINARY PROBLEMS  *BOWEL PROBLEMS  UNUSUAL RASH Items with * indicate a potential emergency and should be followed up as soon as possible.  Feel free to call the clinic you have any questions or concerns. The clinic phone number is [phone].  Please show the CHEMO ALERT CARD at check-in to the Emergency Department and triage nurse.   

## 2015-08-18 NOTE — Progress Notes (Signed)
CMET drawn via right ACF @1350 

## 2015-08-19 DIAGNOSIS — T451X5A Adverse effect of antineoplastic and immunosuppressive drugs, initial encounter: Secondary | ICD-10-CM

## 2015-08-19 DIAGNOSIS — G620 Drug-induced polyneuropathy: Secondary | ICD-10-CM | POA: Insufficient documentation

## 2015-08-20 ENCOUNTER — Ambulatory Visit (HOSPITAL_BASED_OUTPATIENT_CLINIC_OR_DEPARTMENT_OTHER): Payer: BLUE CROSS/BLUE SHIELD

## 2015-08-20 VITALS — BP 109/69 | HR 74 | Temp 98.4°F | Resp 20

## 2015-08-20 DIAGNOSIS — T451X5A Adverse effect of antineoplastic and immunosuppressive drugs, initial encounter: Principal | ICD-10-CM

## 2015-08-20 DIAGNOSIS — C762 Malignant neoplasm of abdomen: Secondary | ICD-10-CM

## 2015-08-20 DIAGNOSIS — C5701 Malignant neoplasm of right fallopian tube: Secondary | ICD-10-CM

## 2015-08-20 DIAGNOSIS — D701 Agranulocytosis secondary to cancer chemotherapy: Secondary | ICD-10-CM

## 2015-08-20 MED ORDER — TBO-FILGRASTIM 480 MCG/0.8ML ~~LOC~~ SOSY
480.0000 ug | PREFILLED_SYRINGE | Freq: Once | SUBCUTANEOUS | Status: AC
  Administered 2015-08-20: 480 ug via SUBCUTANEOUS
  Filled 2015-08-20: qty 0.8

## 2015-08-20 NOTE — Patient Instructions (Signed)
Tbo-Filgrastim injection What is this medicine? TBO-FILGRASTIM (T B O fil GRA stim) is a granulocyte colony-stimulating factor that stimulates the growth of neutrophils, a type of white blood cell important in the body's fight against infection. It is used to reduce the incidence of fever and infection in patients with certain types of cancer who are receiving chemotherapy that affects the bone marrow. This medicine may be used for other purposes; ask your health care provider or pharmacist if you have questions. What should I tell my health care provider before I take this medicine? They need to know if you have any of these conditions: -ongoing radiation therapy -sickle cell anemia -an unusual or allergic reaction to tbo-filgrastim, filgrastim, pegfilgrastim, other medicines, foods, dyes, or preservatives -pregnant or trying to get pregnant -breast-feeding How should I use this medicine? This medicine is for injection under the skin. If you get this medicine at home, you will be taught how to prepare and give this medicine. Refer to the Instructions for Use that come with your medication packaging. Use exactly as directed. Take your medicine at regular intervals. Do not take your medicine more often than directed. It is important that you put your used needles and syringes in a special sharps container. Do not put them in a trash can. If you do not have a sharps container, call your pharmacist or healthcare provider to get one. Talk to your pediatrician regarding the use of this medicine in children. Special care may be needed. Overdosage: If you think you have taken too much of this medicine contact a poison control center or emergency room at once. NOTE: This medicine is only for you. Do not share this medicine with others. What if I miss a dose? It is important not to miss your dose. Call your doctor or health care professional if you miss a dose. What may interact with this medicine? This  medicine may interact with the following medications: -medicines that may cause a release of neutrophils, such as lithium This list may not describe all possible interactions. Give your health care provider a list of all the medicines, herbs, non-prescription drugs, or dietary supplements you use. Also tell them if you smoke, drink alcohol, or use illegal drugs. Some items may interact with your medicine. What should I watch for while using this medicine? You may need blood work done while you are taking this medicine. What side effects may I notice from receiving this medicine? Side effects that you should report to your doctor or health care professional as soon as possible: -allergic reactions like skin rash, itching or hives, swelling of the face, lips, or tongue -shortness of breath or breathing problems -fever -pain, redness, or irritation at site where injected -pinpoint red spots on the skin -stomach or side pain, or pain at the shoulder -swelling -tiredness -trouble passing urine Side effects that usually do not require medical attention (Report these to your doctor or health care professional if they continue or are bothersome.): -bone pain -muscle pain This list may not describe all possible side effects. Call your doctor for medical advice about side effects. You may report side effects to FDA at 1-800-FDA-1088. Where should I keep my medicine? Keep out of the reach of children. Store in a refrigerator between 2 and 8 degrees C (36 and 46 degrees F). Keep in carton to protect from light. Throw away this medicine if it is left out of the refrigerator for more than 5 consecutive days. Throw away any unused   medicine after the expiration date. NOTE: This sheet is a summary. It may not cover all possible information. If you have questions about this medicine, talk to your doctor, pharmacist, or health care provider.    2016, Elsevier/Gold Standard. (2013-05-24  11:52:29) Pegfilgrastim injection What is this medicine? PEGFILGRASTIM (PEG fil gra stim) is a long-acting granulocyte colony-stimulating factor that stimulates the growth of neutrophils, a type of white blood cell important in the body's fight against infection. It is used to reduce the incidence of fever and infection in patients with certain types of cancer who are receiving chemotherapy that affects the bone marrow, and to increase survival after being exposed to high doses of radiation. This medicine may be used for other purposes; ask your health care provider or pharmacist if you have questions. What should I tell my health care provider before I take this medicine? They need to know if you have any of these conditions: -kidney disease -latex allergy -ongoing radiation therapy -sickle cell disease -skin reactions to acrylic adhesives (On-Body Injector only) -an unusual or allergic reaction to pegfilgrastim, filgrastim, other medicines, foods, dyes, or preservatives -pregnant or trying to get pregnant -breast-feeding How should I use this medicine? This medicine is for injection under the skin. If you get this medicine at home, you will be taught how to prepare and give the pre-filled syringe or how to use the On-body Injector. Refer to the patient Instructions for Use for detailed instructions. Use exactly as directed. Take your medicine at regular intervals. Do not take your medicine more often than directed. It is important that you put your used needles and syringes in a special sharps container. Do not put them in a trash can. If you do not have a sharps container, call your pharmacist or healthcare provider to get one. Talk to your pediatrician regarding the use of this medicine in children. While this drug may be prescribed for selected conditions, precautions do apply. Overdosage: If you think you have taken too much of this medicine contact a poison control center or emergency room  at once. NOTE: This medicine is only for you. Do not share this medicine with others. What if I miss a dose? It is important not to miss your dose. Call your doctor or health care professional if you miss your dose. If you miss a dose due to an On-body Injector failure or leakage, a new dose should be administered as soon as possible using a single prefilled syringe for manual use. What may interact with this medicine? Interactions have not been studied. Give your health care provider a list of all the medicines, herbs, non-prescription drugs, or dietary supplements you use. Also tell them if you smoke, drink alcohol, or use illegal drugs. Some items may interact with your medicine. This list may not describe all possible interactions. Give your health care provider a list of all the medicines, herbs, non-prescription drugs, or dietary supplements you use. Also tell them if you smoke, drink alcohol, or use illegal drugs. Some items may interact with your medicine. What should I watch for while using this medicine? You may need blood work done while you are taking this medicine. If you are going to need a MRI, CT scan, or other procedure, tell your doctor that you are using this medicine (On-Body Injector only). What side effects may I notice from receiving this medicine? Side effects that you should report to your doctor or health care professional as soon as possible: -allergic reactions like skin  rash, itching or hives, swelling of the face, lips, or tongue -dizziness -fever -pain, redness, or irritation at site where injected -pinpoint red spots on the skin -red or dark-brown urine -shortness of breath or breathing problems -stomach or side pain, or pain at the shoulder -swelling -tiredness -trouble passing urine or change in the amount of urine Side effects that usually do not require medical attention (report to your doctor or health care professional if they continue or are  bothersome): -bone pain -muscle pain This list may not describe all possible side effects. Call your doctor for medical advice about side effects. You may report side effects to FDA at 1-800-FDA-1088. Where should I keep my medicine? Keep out of the reach of children. Store pre-filled syringes in a refrigerator between 2 and 8 degrees C (36 and 46 degrees F). Do not freeze. Keep in carton to protect from light. Throw away this medicine if it is left out of the refrigerator for more than 48 hours. Throw away any unused medicine after the expiration date. NOTE: This sheet is a summary. It may not cover all possible information. If you have questions about this medicine, talk to your doctor, pharmacist, or health care provider.    2016, Elsevier/Gold Standard. (2014-02-21 14:30:14)

## 2015-08-21 ENCOUNTER — Ambulatory Visit (HOSPITAL_BASED_OUTPATIENT_CLINIC_OR_DEPARTMENT_OTHER): Payer: BLUE CROSS/BLUE SHIELD

## 2015-08-21 VITALS — BP 117/68 | HR 74 | Temp 98.0°F | Resp 18

## 2015-08-21 DIAGNOSIS — T451X5A Adverse effect of antineoplastic and immunosuppressive drugs, initial encounter: Principal | ICD-10-CM

## 2015-08-21 DIAGNOSIS — D701 Agranulocytosis secondary to cancer chemotherapy: Secondary | ICD-10-CM | POA: Diagnosis not present

## 2015-08-21 DIAGNOSIS — C762 Malignant neoplasm of abdomen: Secondary | ICD-10-CM | POA: Diagnosis not present

## 2015-08-21 MED ORDER — TBO-FILGRASTIM 480 MCG/0.8ML ~~LOC~~ SOSY
480.0000 ug | PREFILLED_SYRINGE | Freq: Once | SUBCUTANEOUS | Status: AC
  Administered 2015-08-21: 480 ug via SUBCUTANEOUS
  Filled 2015-08-21: qty 0.8

## 2015-08-21 NOTE — Patient Instructions (Signed)
Tbo-Filgrastim injection What is this medicine? TBO-FILGRASTIM (T B O fil GRA stim) is a granulocyte colony-stimulating factor that stimulates the growth of neutrophils, a type of white blood cell important in the body's fight against infection. It is used to reduce the incidence of fever and infection in patients with certain types of cancer who are receiving chemotherapy that affects the bone marrow. This medicine may be used for other purposes; ask your health care provider or pharmacist if you have questions. What should I tell my health care provider before I take this medicine? They need to know if you have any of these conditions: -ongoing radiation therapy -sickle cell anemia -an unusual or allergic reaction to tbo-filgrastim, filgrastim, pegfilgrastim, other medicines, foods, dyes, or preservatives -pregnant or trying to get pregnant -breast-feeding How should I use this medicine? This medicine is for injection under the skin. If you get this medicine at home, you will be taught how to prepare and give this medicine. Refer to the Instructions for Use that come with your medication packaging. Use exactly as directed. Take your medicine at regular intervals. Do not take your medicine more often than directed. It is important that you put your used needles and syringes in a special sharps container. Do not put them in a trash can. If you do not have a sharps container, call your pharmacist or healthcare provider to get one. Talk to your pediatrician regarding the use of this medicine in children. Special care may be needed. Overdosage: If you think you have taken too much of this medicine contact a poison control center or emergency room at once. NOTE: This medicine is only for you. Do not share this medicine with others. What if I miss a dose? It is important not to miss your dose. Call your doctor or health care professional if you miss a dose. What may interact with this medicine? This  medicine may interact with the following medications: -medicines that may cause a release of neutrophils, such as lithium This list may not describe all possible interactions. Give your health care provider a list of all the medicines, herbs, non-prescription drugs, or dietary supplements you use. Also tell them if you smoke, drink alcohol, or use illegal drugs. Some items may interact with your medicine. What should I watch for while using this medicine? You may need blood work done while you are taking this medicine. What side effects may I notice from receiving this medicine? Side effects that you should report to your doctor or health care professional as soon as possible: -allergic reactions like skin rash, itching or hives, swelling of the face, lips, or tongue -shortness of breath or breathing problems -fever -pain, redness, or irritation at site where injected -pinpoint red spots on the skin -stomach or side pain, or pain at the shoulder -swelling -tiredness -trouble passing urine Side effects that usually do not require medical attention (Report these to your doctor or health care professional if they continue or are bothersome.): -bone pain -muscle pain This list may not describe all possible side effects. Call your doctor for medical advice about side effects. You may report side effects to FDA at 1-800-FDA-1088. Where should I keep my medicine? Keep out of the reach of children. Store in a refrigerator between 2 and 8 degrees C (36 and 46 degrees F). Keep in carton to protect from light. Throw away this medicine if it is left out of the refrigerator for more than 5 consecutive days. Throw away any unused   medicine after the expiration date. NOTE: This sheet is a summary. It may not cover all possible information. If you have questions about this medicine, talk to your doctor, pharmacist, or health care provider.    2016, Elsevier/Gold Standard. (2013-05-24  11:52:29) Pegfilgrastim injection What is this medicine? PEGFILGRASTIM (PEG fil gra stim) is a long-acting granulocyte colony-stimulating factor that stimulates the growth of neutrophils, a type of white blood cell important in the body's fight against infection. It is used to reduce the incidence of fever and infection in patients with certain types of cancer who are receiving chemotherapy that affects the bone marrow, and to increase survival after being exposed to high doses of radiation. This medicine may be used for other purposes; ask your health care provider or pharmacist if you have questions. What should I tell my health care provider before I take this medicine? They need to know if you have any of these conditions: -kidney disease -latex allergy -ongoing radiation therapy -sickle cell disease -skin reactions to acrylic adhesives (On-Body Injector only) -an unusual or allergic reaction to pegfilgrastim, filgrastim, other medicines, foods, dyes, or preservatives -pregnant or trying to get pregnant -breast-feeding How should I use this medicine? This medicine is for injection under the skin. If you get this medicine at home, you will be taught how to prepare and give the pre-filled syringe or how to use the On-body Injector. Refer to the patient Instructions for Use for detailed instructions. Use exactly as directed. Take your medicine at regular intervals. Do not take your medicine more often than directed. It is important that you put your used needles and syringes in a special sharps container. Do not put them in a trash can. If you do not have a sharps container, call your pharmacist or healthcare provider to get one. Talk to your pediatrician regarding the use of this medicine in children. While this drug may be prescribed for selected conditions, precautions do apply. Overdosage: If you think you have taken too much of this medicine contact a poison control center or emergency room  at once. NOTE: This medicine is only for you. Do not share this medicine with others. What if I miss a dose? It is important not to miss your dose. Call your doctor or health care professional if you miss your dose. If you miss a dose due to an On-body Injector failure or leakage, a new dose should be administered as soon as possible using a single prefilled syringe for manual use. What may interact with this medicine? Interactions have not been studied. Give your health care provider a list of all the medicines, herbs, non-prescription drugs, or dietary supplements you use. Also tell them if you smoke, drink alcohol, or use illegal drugs. Some items may interact with your medicine. This list may not describe all possible interactions. Give your health care provider a list of all the medicines, herbs, non-prescription drugs, or dietary supplements you use. Also tell them if you smoke, drink alcohol, or use illegal drugs. Some items may interact with your medicine. What should I watch for while using this medicine? You may need blood work done while you are taking this medicine. If you are going to need a MRI, CT scan, or other procedure, tell your doctor that you are using this medicine (On-Body Injector only). What side effects may I notice from receiving this medicine? Side effects that you should report to your doctor or health care professional as soon as possible: -allergic reactions like skin  rash, itching or hives, swelling of the face, lips, or tongue -dizziness -fever -pain, redness, or irritation at site where injected -pinpoint red spots on the skin -red or dark-brown urine -shortness of breath or breathing problems -stomach or side pain, or pain at the shoulder -swelling -tiredness -trouble passing urine or change in the amount of urine Side effects that usually do not require medical attention (report to your doctor or health care professional if they continue or are  bothersome): -bone pain -muscle pain This list may not describe all possible side effects. Call your doctor for medical advice about side effects. You may report side effects to FDA at 1-800-FDA-1088. Where should I keep my medicine? Keep out of the reach of children. Store pre-filled syringes in a refrigerator between 2 and 8 degrees C (36 and 46 degrees F). Do not freeze. Keep in carton to protect from light. Throw away this medicine if it is left out of the refrigerator for more than 48 hours. Throw away any unused medicine after the expiration date. NOTE: This sheet is a summary. It may not cover all possible information. If you have questions about this medicine, talk to your doctor, pharmacist, or health care provider.    2016, Elsevier/Gold Standard. (2014-02-21 14:30:14)

## 2015-08-25 ENCOUNTER — Ambulatory Visit: Payer: BLUE CROSS/BLUE SHIELD | Admitting: Oncology

## 2015-08-25 ENCOUNTER — Other Ambulatory Visit (HOSPITAL_BASED_OUTPATIENT_CLINIC_OR_DEPARTMENT_OTHER): Payer: BLUE CROSS/BLUE SHIELD

## 2015-08-25 ENCOUNTER — Ambulatory Visit (HOSPITAL_BASED_OUTPATIENT_CLINIC_OR_DEPARTMENT_OTHER): Payer: BLUE CROSS/BLUE SHIELD

## 2015-08-25 VITALS — BP 108/66 | HR 85 | Temp 98.1°F | Resp 17

## 2015-08-25 DIAGNOSIS — C762 Malignant neoplasm of abdomen: Secondary | ICD-10-CM

## 2015-08-25 DIAGNOSIS — C482 Malignant neoplasm of peritoneum, unspecified: Secondary | ICD-10-CM

## 2015-08-25 DIAGNOSIS — Z5111 Encounter for antineoplastic chemotherapy: Secondary | ICD-10-CM

## 2015-08-25 DIAGNOSIS — C5701 Malignant neoplasm of right fallopian tube: Secondary | ICD-10-CM

## 2015-08-25 LAB — CBC WITH DIFFERENTIAL/PLATELET
BASO%: 0.2 % (ref 0.0–2.0)
Basophils Absolute: 0 10*3/uL (ref 0.0–0.1)
EOS%: 0 % (ref 0.0–7.0)
Eosinophils Absolute: 0 10*3/uL (ref 0.0–0.5)
HCT: 32.8 % — ABNORMAL LOW (ref 34.8–46.6)
HGB: 11.2 g/dL — ABNORMAL LOW (ref 11.6–15.9)
LYMPH%: 11.5 % — ABNORMAL LOW (ref 14.0–49.7)
MCH: 33.9 pg (ref 25.1–34.0)
MCHC: 34.1 g/dL (ref 31.5–36.0)
MCV: 99.5 fL (ref 79.5–101.0)
MONO#: 0 10*3/uL — ABNORMAL LOW (ref 0.1–0.9)
MONO%: 0.5 % (ref 0.0–14.0)
NEUT#: 2.1 10*3/uL (ref 1.5–6.5)
NEUT%: 87.8 % — ABNORMAL HIGH (ref 38.4–76.8)
Platelets: 263 10*3/uL (ref 145–400)
RBC: 3.3 10*6/uL — ABNORMAL LOW (ref 3.70–5.45)
RDW: 13.4 % (ref 11.2–14.5)
WBC: 2.3 10*3/uL — ABNORMAL LOW (ref 3.9–10.3)
lymph#: 0.3 10*3/uL — ABNORMAL LOW (ref 0.9–3.3)

## 2015-08-25 LAB — COMPREHENSIVE METABOLIC PANEL
ALT: 20 U/L (ref 0–55)
AST: 16 U/L (ref 5–34)
Albumin: 4.4 g/dL (ref 3.5–5.0)
Alkaline Phosphatase: 79 U/L (ref 40–150)
Anion Gap: 12 mEq/L — ABNORMAL HIGH (ref 3–11)
BUN: 7.7 mg/dL (ref 7.0–26.0)
CO2: 22 mEq/L (ref 22–29)
Calcium: 9.6 mg/dL (ref 8.4–10.4)
Chloride: 102 mEq/L (ref 98–109)
Creatinine: 0.7 mg/dL (ref 0.6–1.1)
EGFR: >90 mL/min/{1.73_m2} (ref >90)
Glucose: 196 mg/dl — ABNORMAL HIGH (ref 70–140)
Potassium: 3.8 mEq/L (ref 3.5–5.1)
Sodium: 136 mEq/L (ref 136–145)
Total Bilirubin: 0.36 mg/dL (ref 0.20–1.20)
Total Protein: 8.1 g/dL (ref 6.4–8.3)

## 2015-08-25 MED ORDER — DIPHENHYDRAMINE HCL 25 MG PO CAPS
ORAL_CAPSULE | ORAL | Status: AC
  Filled 2015-08-25: qty 1

## 2015-08-25 MED ORDER — SODIUM CHLORIDE 0.9 % IV SOLN
Freq: Once | INTRAVENOUS | Status: AC
  Administered 2015-08-25: 14:00:00 via INTRAVENOUS

## 2015-08-25 MED ORDER — SODIUM CHLORIDE 0.9 % IV SOLN: Freq: Once | INTRAVENOUS | Status: DC

## 2015-08-25 MED ORDER — SODIUM CHLORIDE 0.9 % IV SOLN
Freq: Once | INTRAVENOUS | Status: AC
  Administered 2015-08-25: 14:00:00 via INTRAVENOUS
  Filled 2015-08-25: qty 4

## 2015-08-25 MED ORDER — DIPHENHYDRAMINE HCL 25 MG PO CAPS
25.0000 mg | ORAL_CAPSULE | Freq: Once | ORAL | Status: AC
  Administered 2015-08-25: 25 mg via ORAL

## 2015-08-25 MED ORDER — SODIUM CHLORIDE 0.9 % IV SOLN: 20.0000 mg | Freq: Once | INTRAVENOUS | Status: DC

## 2015-08-25 MED ORDER — FAMOTIDINE IN NACL 20-0.9 MG/50ML-% IV SOLN
INTRAVENOUS | Status: AC
  Filled 2015-08-25: qty 50

## 2015-08-25 MED ORDER — SODIUM CHLORIDE 0.9 % IV SOLN
80.0000 mg/m2 | Freq: Once | INTRAVENOUS | Status: AC
  Administered 2015-08-25: 126 mg via INTRAVENOUS
  Filled 2015-08-25: qty 21

## 2015-08-25 MED ORDER — FAMOTIDINE IN NACL 20-0.9 MG/50ML-% IV SOLN
20.0000 mg | Freq: Once | INTRAVENOUS | Status: AC
  Administered 2015-08-25: 20 mg via INTRAVENOUS

## 2015-08-25 NOTE — Patient Instructions (Signed)
[facility] Discharge Instructions for Patients Receiving Chemotherapy  Today you received the following chemotherapy agents:  Taxol  To help prevent nausea and vomiting after your treatment, we encourage you to take your nausea medication as prescribed.   If you develop nausea and vomiting that is not controlled by your nausea medication, call the clinic.   BELOW ARE SYMPTOMS THAT SHOULD BE REPORTED IMMEDIATELY:  *FEVER GREATER THAN 100.5 F  *CHILLS WITH OR WITHOUT FEVER  NAUSEA AND VOMITING THAT IS NOT CONTROLLED WITH YOUR NAUSEA MEDICATION  *UNUSUAL SHORTNESS OF BREATH  *UNUSUAL BRUISING OR BLEEDING  TENDERNESS IN MOUTH AND THROAT WITH OR WITHOUT PRESENCE OF ULCERS  *URINARY PROBLEMS  *BOWEL PROBLEMS  UNUSUAL RASH Items with * indicate a potential emergency and should be followed up as soon as possible.  Feel free to call the clinic you have any questions or concerns. The clinic phone number is [phone].  Please show the CHEMO ALERT CARD at check-in to the Emergency Department and triage nurse.   

## 2015-08-26 ENCOUNTER — Ambulatory Visit (HOSPITAL_BASED_OUTPATIENT_CLINIC_OR_DEPARTMENT_OTHER): Payer: BLUE CROSS/BLUE SHIELD

## 2015-08-26 VITALS — BP 100/66 | HR 69 | Temp 98.4°F | Resp 18

## 2015-08-26 DIAGNOSIS — T451X5A Adverse effect of antineoplastic and immunosuppressive drugs, initial encounter: Principal | ICD-10-CM

## 2015-08-26 DIAGNOSIS — C762 Malignant neoplasm of abdomen: Secondary | ICD-10-CM

## 2015-08-26 DIAGNOSIS — D701 Agranulocytosis secondary to cancer chemotherapy: Secondary | ICD-10-CM

## 2015-08-26 DIAGNOSIS — C5701 Malignant neoplasm of right fallopian tube: Secondary | ICD-10-CM | POA: Diagnosis not present

## 2015-08-26 MED ORDER — TBO-FILGRASTIM 480 MCG/0.8ML ~~LOC~~ SOSY
480.0000 ug | PREFILLED_SYRINGE | Freq: Once | SUBCUTANEOUS | Status: AC
  Administered 2015-08-26: 480 ug via SUBCUTANEOUS
  Filled 2015-08-26: qty 0.8

## 2015-08-26 NOTE — Progress Notes (Signed)
Ms. Sleet told that Dr. [name] only intended one granix injection this week.  This nurse verified the orders with Dr. [name].

## 2015-08-26 NOTE — Patient Instructions (Signed)
Tbo-Filgrastim injection What is this medicine? TBO-FILGRASTIM (T B O fil GRA stim) is a granulocyte colony-stimulating factor that stimulates the growth of neutrophils, a type of white blood cell important in the body's fight against infection. It is used to reduce the incidence of fever and infection in patients with certain types of cancer who are receiving chemotherapy that affects the bone marrow. This medicine may be used for other purposes; ask your health care provider or pharmacist if you have questions. What should I tell my health care provider before I take this medicine? They need to know if you have any of these conditions: -ongoing radiation therapy -sickle cell anemia -an unusual or allergic reaction to tbo-filgrastim, filgrastim, pegfilgrastim, other medicines, foods, dyes, or preservatives -pregnant or trying to get pregnant -breast-feeding How should I use this medicine? This medicine is for injection under the skin. If you get this medicine at home, you will be taught how to prepare and give this medicine. Refer to the Instructions for Use that come with your medication packaging. Use exactly as directed. Take your medicine at regular intervals. Do not take your medicine more often than directed. It is important that you put your used needles and syringes in a special sharps container. Do not put them in a trash can. If you do not have a sharps container, call your pharmacist or healthcare provider to get one. Talk to your pediatrician regarding the use of this medicine in children. Special care may be needed. Overdosage: If you think you have taken too much of this medicine contact a poison control center or emergency room at once. NOTE: This medicine is only for you. Do not share this medicine with others. What if I miss a dose? It is important not to miss your dose. Call your doctor or health care professional if you miss a dose. What may interact with this medicine? This  medicine may interact with the following medications: -medicines that may cause a release of neutrophils, such as lithium This list may not describe all possible interactions. Give your health care provider a list of all the medicines, herbs, non-prescription drugs, or dietary supplements you use. Also tell them if you smoke, drink alcohol, or use illegal drugs. Some items may interact with your medicine. What should I watch for while using this medicine? You may need blood work done while you are taking this medicine. What side effects may I notice from receiving this medicine? Side effects that you should report to your doctor or health care professional as soon as possible: -allergic reactions like skin rash, itching or hives, swelling of the face, lips, or tongue -shortness of breath or breathing problems -fever -pain, redness, or irritation at site where injected -pinpoint red spots on the skin -stomach or side pain, or pain at the shoulder -swelling -tiredness -trouble passing urine Side effects that usually do not require medical attention (Report these to your doctor or health care professional if they continue or are bothersome.): -bone pain -muscle pain This list may not describe all possible side effects. Call your doctor for medical advice about side effects. You may report side effects to FDA at 1-800-FDA-1088. Where should I keep my medicine? Keep out of the reach of children. Store in a refrigerator between 2 and 8 degrees C (36 and 46 degrees F). Keep in carton to protect from light. Throw away this medicine if it is left out of the refrigerator for more than 5 consecutive days. Throw away any unused   medicine after the expiration date. NOTE: This sheet is a summary. It may not cover all possible information. If you have questions about this medicine, talk to your doctor, pharmacist, or health care provider.    2016, Elsevier/Gold Standard. (2013-05-24  11:52:29) Pegfilgrastim injection What is this medicine? PEGFILGRASTIM (PEG fil gra stim) is a long-acting granulocyte colony-stimulating factor that stimulates the growth of neutrophils, a type of white blood cell important in the body's fight against infection. It is used to reduce the incidence of fever and infection in patients with certain types of cancer who are receiving chemotherapy that affects the bone marrow, and to increase survival after being exposed to high doses of radiation. This medicine may be used for other purposes; ask your health care provider or pharmacist if you have questions. What should I tell my health care provider before I take this medicine? They need to know if you have any of these conditions: -kidney disease -latex allergy -ongoing radiation therapy -sickle cell disease -skin reactions to acrylic adhesives (On-Body Injector only) -an unusual or allergic reaction to pegfilgrastim, filgrastim, other medicines, foods, dyes, or preservatives -pregnant or trying to get pregnant -breast-feeding How should I use this medicine? This medicine is for injection under the skin. If you get this medicine at home, you will be taught how to prepare and give the pre-filled syringe or how to use the On-body Injector. Refer to the patient Instructions for Use for detailed instructions. Use exactly as directed. Take your medicine at regular intervals. Do not take your medicine more often than directed. It is important that you put your used needles and syringes in a special sharps container. Do not put them in a trash can. If you do not have a sharps container, call your pharmacist or healthcare provider to get one. Talk to your pediatrician regarding the use of this medicine in children. While this drug may be prescribed for selected conditions, precautions do apply. Overdosage: If you think you have taken too much of this medicine contact a poison control center or emergency room  at once. NOTE: This medicine is only for you. Do not share this medicine with others. What if I miss a dose? It is important not to miss your dose. Call your doctor or health care professional if you miss your dose. If you miss a dose due to an On-body Injector failure or leakage, a new dose should be administered as soon as possible using a single prefilled syringe for manual use. What may interact with this medicine? Interactions have not been studied. Give your health care provider a list of all the medicines, herbs, non-prescription drugs, or dietary supplements you use. Also tell them if you smoke, drink alcohol, or use illegal drugs. Some items may interact with your medicine. This list may not describe all possible interactions. Give your health care provider a list of all the medicines, herbs, non-prescription drugs, or dietary supplements you use. Also tell them if you smoke, drink alcohol, or use illegal drugs. Some items may interact with your medicine. What should I watch for while using this medicine? You may need blood work done while you are taking this medicine. If you are going to need a MRI, CT scan, or other procedure, tell your doctor that you are using this medicine (On-Body Injector only). What side effects may I notice from receiving this medicine? Side effects that you should report to your doctor or health care professional as soon as possible: -allergic reactions like skin  rash, itching or hives, swelling of the face, lips, or tongue -dizziness -fever -pain, redness, or irritation at site where injected -pinpoint red spots on the skin -red or dark-brown urine -shortness of breath or breathing problems -stomach or side pain, or pain at the shoulder -swelling -tiredness -trouble passing urine or change in the amount of urine Side effects that usually do not require medical attention (report to your doctor or health care professional if they continue or are  bothersome): -bone pain -muscle pain This list may not describe all possible side effects. Call your doctor for medical advice about side effects. You may report side effects to FDA at 1-800-FDA-1088. Where should I keep my medicine? Keep out of the reach of children. Store pre-filled syringes in a refrigerator between 2 and 8 degrees C (36 and 46 degrees F). Do not freeze. Keep in carton to protect from light. Throw away this medicine if it is left out of the refrigerator for more than 48 hours. Throw away any unused medicine after the expiration date. NOTE: This sheet is a summary. It may not cover all possible information. If you have questions about this medicine, talk to your doctor, pharmacist, or health care provider.    2016, Elsevier/Gold Standard. (2014-02-21 14:30:14)

## 2015-08-29 ENCOUNTER — Other Ambulatory Visit: Payer: Self-pay

## 2015-08-29 DIAGNOSIS — C5701 Malignant neoplasm of right fallopian tube: Secondary | ICD-10-CM

## 2015-08-31 ENCOUNTER — Other Ambulatory Visit: Payer: Self-pay | Admitting: Oncology

## 2015-09-01 ENCOUNTER — Encounter: Payer: Self-pay | Admitting: Oncology

## 2015-09-01 ENCOUNTER — Telehealth: Payer: Self-pay | Admitting: Oncology

## 2015-09-01 ENCOUNTER — Ambulatory Visit (HOSPITAL_BASED_OUTPATIENT_CLINIC_OR_DEPARTMENT_OTHER): Payer: BLUE CROSS/BLUE SHIELD

## 2015-09-01 ENCOUNTER — Ambulatory Visit (HOSPITAL_BASED_OUTPATIENT_CLINIC_OR_DEPARTMENT_OTHER): Payer: BLUE CROSS/BLUE SHIELD | Admitting: Oncology

## 2015-09-01 VITALS — BP 124/65 | HR 67 | Temp 98.3°F | Resp 18 | Ht 62.0 in | Wt 123.1 lb

## 2015-09-01 DIAGNOSIS — G620 Drug-induced polyneuropathy: Secondary | ICD-10-CM

## 2015-09-01 DIAGNOSIS — D701 Agranulocytosis secondary to cancer chemotherapy: Secondary | ICD-10-CM

## 2015-09-01 DIAGNOSIS — C5701 Malignant neoplasm of right fallopian tube: Secondary | ICD-10-CM | POA: Diagnosis not present

## 2015-09-01 DIAGNOSIS — R209 Unspecified disturbances of skin sensation: Secondary | ICD-10-CM

## 2015-09-01 DIAGNOSIS — Z79899 Other long term (current) drug therapy: Secondary | ICD-10-CM

## 2015-09-01 DIAGNOSIS — D72819 Decreased white blood cell count, unspecified: Secondary | ICD-10-CM

## 2015-09-01 DIAGNOSIS — T451X5A Adverse effect of antineoplastic and immunosuppressive drugs, initial encounter: Secondary | ICD-10-CM

## 2015-09-01 DIAGNOSIS — R202 Paresthesia of skin: Secondary | ICD-10-CM

## 2015-09-01 LAB — CBC WITH DIFFERENTIAL/PLATELET
BASO%: 0.4 % (ref 0.0–2.0)
Basophils Absolute: 0 10*3/uL (ref 0.0–0.1)
EOS%: 0.1 % (ref 0.0–7.0)
Eosinophils Absolute: 0 10*3/uL (ref 0.0–0.5)
HCT: 32.5 % — ABNORMAL LOW (ref 34.8–46.6)
HGB: 11 g/dL — ABNORMAL LOW (ref 11.6–15.9)
LYMPH%: 51.3 % — ABNORMAL HIGH (ref 14.0–49.7)
MCH: 33.7 pg (ref 25.1–34.0)
MCHC: 34 g/dL (ref 31.5–36.0)
MCV: 99.2 fL (ref 79.5–101.0)
MONO#: 0.2 10*3/uL (ref 0.1–0.9)
MONO%: 7.5 % (ref 0.0–14.0)
NEUT#: 1.1 10*3/uL — ABNORMAL LOW (ref 1.5–6.5)
NEUT%: 40.7 % (ref 38.4–76.8)
Platelets: 267 10*3/uL (ref 145–400)
RBC: 3.27 10*6/uL — ABNORMAL LOW (ref 3.70–5.45)
RDW: 13.5 % (ref 11.2–14.5)
WBC: 2.6 10*3/uL — ABNORMAL LOW (ref 3.9–10.3)
lymph#: 1.3 10*3/uL (ref 0.9–3.3)

## 2015-09-01 LAB — COMPREHENSIVE METABOLIC PANEL
ALT: 15 U/L (ref 0–55)
AST: 16 U/L (ref 5–34)
Albumin: 4.4 g/dL (ref 3.5–5.0)
Alkaline Phosphatase: 56 U/L (ref 40–150)
Anion Gap: 10 mEq/L (ref 3–11)
BUN: 8.4 mg/dL (ref 7.0–26.0)
CO2: 25 mEq/L (ref 22–29)
Calcium: 9.5 mg/dL (ref 8.4–10.4)
Chloride: 102 mEq/L (ref 98–109)
Creatinine: 0.7 mg/dL (ref 0.6–1.1)
EGFR: >90 mL/min/{1.73_m2} (ref >90)
Glucose: 87 mg/dl (ref 70–140)
Potassium: 3.6 mEq/L (ref 3.5–5.1)
Sodium: 136 mEq/L (ref 136–145)
Total Bilirubin: 0.47 mg/dL (ref 0.20–1.20)
Total Protein: 7.7 g/dL (ref 6.4–8.3)

## 2015-09-01 NOTE — Progress Notes (Signed)
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 She had cycle 6 carbo taxol using dose dense regimen due to concerns about unusual neuropathy from taxol. The neuropathy symptoms face, neck, LE and possibly back have increased since taxol was resumed with cycle 6.  On day 15 cycle 6 taxol, prior to receiving the drug, patient reported no worsening of neuropathy symptoms compared with the week prior. Since taxol on 08-25-15, patient has had progressive increase in mild numbness lower face and neck bilaterally, and intermittent numbness in LE mostly thighs. She has some dull discomfort medial lower left scapula,  one episode of acute discomfort "like a bee sting" to right of spine midback and on foot. She had a few days of nausea, improved with prn antiemetics, none now. Constipation around chemo has improved in last few days. She denies abdominal or pelvic pain, SOB, HA, bleeding, LE swelling, fever or symptoms of infection. Shas had no significant numbness fingers or toes.  Remainder of 10 point Review of Systems negative/ unchanged.    CA 125 1226 on 03-20-2015 No central line Genetics testing negative by Breast Ovarian panel by [facility] sent 05-05-15, tho VUS with PALB2.  ONCOLOGIC HISTORY Patient has been generally healthy, post vaginal hysterectomy for fibroids and menorrhagia 2012. In ~ 11-2014 she noticed bladder pressure and occasional trace blood ? Vaginal vs urinary. She was seen by Dr Pilar Jarvis, with cystoscopy 01-31-15 suggesting extrinsic compression of bladder. She had CT AP at [facility] 03-04-14, with moderate ascites, omental caking in lateral left abdomen and pelvis, absent uterus, "ovaries are likely  visualized" , no adenopathy, no hydronephrosis or renal stones, lung bases clear, hemangioma posterior right hepatic lobe (CT report to be scanned into this EMR). CA 125 by Dr Collene Mares 03-06-15 was 957, with CEA 1.0. She had colonoscopy by Dr Collene Mares on 03-12-15, reportedly unremarkable. She had consultation with Dr Denman George on 03-17-15, with plan for initial surgery if amenable to optimal debulking, otherwise to begin with neoadjuvant chemotherapy. CT chest 03-21-15 no findings of concern. She had exploratory laparotomy with BSO, omentectomy and radical R0 debulking on 03-27-15, pathology ([id number]) high grade serous carcinoma involving bilateral tubes and ovaries, omentum and peritoneal nodule, final diagnosis IIIC right fallopian. She began chemotherapy with carboplatin and taxol on 04-14-15. She was neutropenic by day 8 cycle 1, ANC 0.9. Cycle 5 was delayed with unusual neuropathy symptoms and cytopenias, given as carboplatin only on 07-21-15 with neulasta. Cycle 6 began 08-11-15 using weekly taxol and granix support.     Objective:  Vital signs in last 24 hours: Weight up 2 lbs to 123, BMI 22.6, 124/65, 67 regular, 18 not labored, 100% sat, 98.3  Alert, oriented and appropriate. Ambulatory without assistance difficulty.  Complete alopecia  HEENT:PERRL, sclerae not icteric. Oral mucosa moist without lesions, posterior pharynx clear.  Neck supple. No JVD.  Lymphatics:no cervical,supraclavicular, axillary or inguinal adenopathy Resp: clear to auscultation bilaterally and normal percussion bilaterally Cardio: regular rate and rhythm. No gallop. GI: soft, nontender, not distended, no mass or organomegaly. Normally active bowel sounds. Surgical incision not remarkable. Musculoskeletal/ Extremities: LE without pitting edema, cords, tenderness. Spine not tender.  Neuro: no peripheral neuropathy hands or feet. Slight numbness lower face and neck bilaterally. Strength symmetrical in  extremities. CN seem intact.  PSYCH  appropriate mood and affect Skin without rash, ecchymosis, petechiae   Lab Results:  Results for orders placed or performed in visit on 09/01/15  CBC with Differential  Result Value Ref Range   WBC 2.6 (L) 3.9 - 10.3 10e3/uL   NEUT# 1.1 (L) 1.5 - 6.5 10e3/uL   HGB 11.0 (L) 11.6 - 15.9 g/dL   HCT 32.5 (L) 34.8 - 46.6 %   Platelets 267 145 - 400 10e3/uL   MCV 99.2 79.5 - 101.0 fL   MCH 33.7 25.1 - 34.0 pg   MCHC 34.0 31.5 - 36.0 g/dL   RBC 3.27 (L) 3.70 - 5.45 10e6/uL   RDW 13.5 11.2 - 14.5 %   lymph# 1.3 0.9 - 3.3 10e3/uL   MONO# 0.2 0.1 - 0.9 10e3/uL   Eosinophils Absolute 0.0 0.0 - 0.5 10e3/uL   Basophils Absolute 0.0 0.0 - 0.1 10e3/uL   NEUT% 40.7 38.4 - 76.8 %   LYMPH% 51.3 (H) 14.0 - 49.7 %   MONO% 7.5 0.0 - 14.0 %   EOS% 0.1 0.0 - 7.0 %   BASO% 0.4 0.0 - 2.0 %  Comprehensive metabolic panel  Result Value Ref Range   Sodium 136 136 - 145 mEq/L   Potassium 3.6 3.5 - 5.1 mEq/L   Chloride 102 98 - 109 mEq/L   CO2 25 22 - 29 mEq/L   Glucose 87 70 - 140 mg/dl   BUN 8.4 7.0 - 26.0 mg/dL   Creatinine 0.7 0.6 - 1.1 mg/dL   Total Bilirubin 0.47 0.20 - 1.20 mg/dL   Alkaline Phosphatase 56 40 - 150 U/L   AST 16 5 - 34 U/L   ALT 15 0 - 55 U/L   Total Protein 7.7 6.4 - 8.3 g/dL   Albumin 4.4 3.5 - 5.0 g/dL   Calcium 9.5 8.4 - 10.4 mg/dL   Anion Gap 10 3 - 11 mEq/L   EGFR >90 >90 ml/min/1.73 m2  CA 125  Result Value Ref Range   Cancer Antigen (CA) 125 17.0 0.0 - 38.1 U/mL    CA 125 available after visit, will be communicated to her by phone  Studies/Results:  No results found. CT CAP requested shortly prior to return visit to Dr Denman George  Medications: I have reviewed the patient's current medications. No granix today as no additional chemo now.  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 Given Dr Jannifer Franklin' concern that she is at risk for significant neurologic  complications, we will not add additional 3 weeks of taxol now to "make up" for taxol held cycle 5. Hopefully these neurologic symptoms will again improve given time out from chemo. I will update Dr Jannifer Franklin by this note.  As above, no additional gCSF today. Discussed gradual improvement in energy expected over next few months.  Patient understands that she will have restaging CT scans and will see Dr Denman George in next few weeks. I have spoken directly with gyn oncology to schedule.  I will recheck labs day of CTs and will see her in follow up of chemo in early Sept, or sooner if needed.    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 Adjuvant carboplatin taxol begun 04-14-15, neutropenic day 8 cycle 2. 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 Cycle 5 delayed x 2 weeks with neurologic concerns and cytopenias, given  as carbo only on 07-21-15. Weekly taxol with cycle 6, clear increase in atypical neuropathy tho fortunately not severe symptoms. Will not attempt further taxol, and will get restaging CT CAP (chest due to complaints of back symptoms) prior to return visit to Dr Denman George 10-01-15.  2. Paresthesias in face and jaw, left shoulder/ LUE: first noticed week prior to #4 chemo, then increased after #4 chemo. Extensive evaluation by neurology did not identify cause other than chemo. No problems with single agent carbo cycle 5, then progressive increase face, neck, thighs with weekly x 3 taxol used cycle 6.  Dr Jannifer Franklin to be updated. 3.chemo neutropenia requiring gCSF. Chemo leukopenia now, expect counts to increase from here as no further chemo. 4.irritable bowel syndrome x years, known to Dr Collene Mares. . 5. Peripheral IV access adequate for all of chemo 6.heterogeneously dense breast tissue including most recent 3D mammograms at [facility] 03-26-15 7. Post cholecystecytomy 8.surgery for crushed left femur age 8 9. post hysterectomy for benign  indications 2012. History of endometriosis. 10. Restlessness with IV benadryl used as standard taxol premedication. Change to po, dose already reduced to 25 mg.    All questions answered and she knows to call if concerns prior to next scheduled appointments. CT and lab orders placed. Time spent 25 min including >50% counseling and coordination of care. Route PCP, cc Drs Jannifer Franklin, Lisabeth Register, Pilar Jarvis   Evlyn Clines, MD   09/02/2015, 2:04 PM

## 2015-09-01 NOTE — Telephone Encounter (Signed)
appt made and avs printed CT to be scheduled by central radiology. Pt request water based contrast

## 2015-09-02 DIAGNOSIS — T451X5A Adverse effect of antineoplastic and immunosuppressive drugs, initial encounter: Secondary | ICD-10-CM

## 2015-09-02 DIAGNOSIS — D701 Agranulocytosis secondary to cancer chemotherapy: Secondary | ICD-10-CM | POA: Insufficient documentation

## 2015-09-02 DIAGNOSIS — G620 Drug-induced polyneuropathy: Secondary | ICD-10-CM | POA: Insufficient documentation

## 2015-09-02 LAB — CA 125: Cancer Antigen (CA) 125: 17 U/mL (ref 0.0–38.1)

## 2015-09-03 ENCOUNTER — Telehealth: Payer: Self-pay

## 2015-09-03 NOTE — Telephone Encounter (Signed)
Kathryn Ramsey in patient's vm stating the results of the CA-125 as noted below by Dr [name].  Pt can call back to Dr. Mariana Kaufman office if she has any questions or concerns.

## 2015-09-03 NOTE — Telephone Encounter (Signed)
-----   Message from Gordy Levan, MD sent at 09/03/2015  8:39 AM EDT ----- Labs seen and need follow up please let her know marker stable in normal range

## 2015-09-23 ENCOUNTER — Other Ambulatory Visit: Payer: Self-pay | Admitting: Oncology

## 2015-09-23 ENCOUNTER — Telehealth: Payer: Self-pay

## 2015-09-23 ENCOUNTER — Telehealth: Payer: Self-pay | Admitting: Oncology

## 2015-09-23 NOTE — Telephone Encounter (Signed)
Spoke with Dr [name] regarding peer to peer review and gave her the phone number noted below.

## 2015-09-23 NOTE — Telephone Encounter (Signed)
Margreta Journey called back and stated that Kathryn Ramsey insurance wants to have a peer to peer review.  Number is [phone].

## 2015-09-23 NOTE — Telephone Encounter (Signed)
Reviewed Dr. Mariana Kaufman phone Conversation for PA of scan. LM for patient that scan approved. If any questions, call back to Dr Mariana Kaufman nurse.

## 2015-09-23 NOTE — Telephone Encounter (Signed)
Told Ms Gridley that the prior authorization for her scan is in process. Records had to be sent  to her insurance  For them to review.  Christina in prior Auth Dept at [facility] [id number] said that the rewie may take another day or so.  Margreta Journey will inform Dr. Mariana Kaufman nurse of authorization status when determined. Ms Michalak verbalized understanding.

## 2015-09-23 NOTE — Telephone Encounter (Signed)
Medical Oncology  Peer to peer requested by insurance for CT CAP. MD spoke directly to nurse at [facility] [phone], who tell me that no diagnosis was provided with our request for CT CAP. Gave him information that patient has IIIC high grade serous fallopian carcinoma. Scans authorized immediately, auth # [id number] good from 09-23-15 thru 03-25-16 for chest abd pelvis. I explained that [place] includes both [facility] and [facility].  Message to managed care.   Evlyn Clines ,MD

## 2015-09-29 ENCOUNTER — Ambulatory Visit (HOSPITAL_COMMUNITY)
Admission: RE | Admit: 2015-09-29 | Discharge: 2015-09-29 | Disposition: A | Discharge status: Home / Self Care | Payer: BLUE CROSS/BLUE SHIELD | Source: Ambulatory Visit | Attending: Oncology | Admitting: Oncology

## 2015-09-29 ENCOUNTER — Encounter (HOSPITAL_COMMUNITY): Payer: Self-pay

## 2015-09-29 ENCOUNTER — Other Ambulatory Visit (HOSPITAL_BASED_OUTPATIENT_CLINIC_OR_DEPARTMENT_OTHER): Payer: BLUE CROSS/BLUE SHIELD

## 2015-09-29 DIAGNOSIS — D1809 Hemangioma of other sites: Secondary | ICD-10-CM | POA: Diagnosis not present

## 2015-09-29 DIAGNOSIS — C5701 Malignant neoplasm of right fallopian tube: Secondary | ICD-10-CM | POA: Insufficient documentation

## 2015-09-29 DIAGNOSIS — C786 Secondary malignant neoplasm of retroperitoneum and peritoneum: Secondary | ICD-10-CM | POA: Insufficient documentation

## 2015-09-29 LAB — CBC WITH DIFFERENTIAL/PLATELET
BASO%: 0.5 % (ref 0.0–2.0)
Basophils Absolute: 0 10*3/uL (ref 0.0–0.1)
EOS%: 1.6 % (ref 0.0–7.0)
Eosinophils Absolute: 0.1 10*3/uL (ref 0.0–0.5)
HCT: 37.4 % (ref 34.8–46.6)
HGB: 12.8 g/dL (ref 11.6–15.9)
LYMPH%: 44.1 % (ref 14.0–49.7)
MCH: 33.3 pg (ref 25.1–34.0)
MCHC: 34.2 g/dL (ref 31.5–36.0)
MCV: 97.4 fL (ref 79.5–101.0)
MONO#: 0.3 10*3/uL (ref 0.1–0.9)
MONO%: 8.9 % (ref 0.0–14.0)
NEUT#: 1.7 10*3/uL (ref 1.5–6.5)
NEUT%: 44.9 % (ref 38.4–76.8)
Platelets: 222 10*3/uL (ref 145–400)
RBC: 3.84 10*6/uL (ref 3.70–5.45)
RDW: 13.2 % (ref 11.2–14.5)
WBC: 3.7 10*3/uL — ABNORMAL LOW (ref 3.9–10.3)
lymph#: 1.6 10*3/uL (ref 0.9–3.3)

## 2015-09-29 LAB — COMPREHENSIVE METABOLIC PANEL
ALT: 16 U/L (ref 0–55)
AST: 21 U/L (ref 5–34)
Albumin: 4.6 g/dL (ref 3.5–5.0)
Alkaline Phosphatase: 53 U/L (ref 40–150)
Anion Gap: 9 mEq/L (ref 3–11)
BUN: 11.8 mg/dL (ref 7.0–26.0)
CO2: 28 mEq/L (ref 22–29)
Calcium: 10.3 mg/dL (ref 8.4–10.4)
Chloride: 102 mEq/L (ref 98–109)
Creatinine: 0.7 mg/dL (ref 0.6–1.1)
EGFR: >90 mL/min/{1.73_m2} (ref >90)
Glucose: 103 mg/dl (ref 70–140)
Potassium: 4.7 mEq/L (ref 3.5–5.1)
Sodium: 140 mEq/L (ref 136–145)
Total Bilirubin: 0.54 mg/dL (ref 0.20–1.20)
Total Protein: 8 g/dL (ref 6.4–8.3)

## 2015-09-29 MED ORDER — IOPAMIDOL (ISOVUE-300) INJECTION 61%
100.0000 mL | Freq: Once | INTRAVENOUS | Status: AC | PRN
Start: 2015-09-29 — End: 2015-09-29
  Administered 2015-09-29: 80 mL via INTRAVENOUS

## 2015-09-29 MED ORDER — DIATRIZOATE MEGLUMINE & SODIUM 66-10 % PO SOLN
30.0000 mL | Freq: Once | ORAL | Status: AC
  Administered 2015-09-29: 30 mL via ORAL

## 2015-09-30 LAB — CA 125: Cancer Antigen (CA) 125: 15 U/mL (ref 0.0–38.1)

## 2015-10-01 ENCOUNTER — Encounter: Payer: Self-pay | Admitting: Gynecologic Oncology

## 2015-10-01 ENCOUNTER — Ambulatory Visit: Payer: BLUE CROSS/BLUE SHIELD | Attending: Gynecologic Oncology | Admitting: Gynecologic Oncology

## 2015-10-01 VITALS — BP 102/69 | HR 59 | Temp 98.0°F | Resp 18 | Ht 62.0 in | Wt 122.7 lb

## 2015-10-01 DIAGNOSIS — D649 Anemia, unspecified: Secondary | ICD-10-CM | POA: Diagnosis not present

## 2015-10-01 DIAGNOSIS — N951 Menopausal and female climacteric states: Secondary | ICD-10-CM | POA: Diagnosis not present

## 2015-10-01 DIAGNOSIS — J45909 Unspecified asthma, uncomplicated: Secondary | ICD-10-CM | POA: Insufficient documentation

## 2015-10-01 DIAGNOSIS — Z808 Family history of malignant neoplasm of other organs or systems: Secondary | ICD-10-CM | POA: Insufficient documentation

## 2015-10-01 DIAGNOSIS — Z90722 Acquired absence of ovaries, bilateral: Secondary | ICD-10-CM | POA: Insufficient documentation

## 2015-10-01 DIAGNOSIS — Z8249 Family history of ischemic heart disease and other diseases of the circulatory system: Secondary | ICD-10-CM | POA: Insufficient documentation

## 2015-10-01 DIAGNOSIS — Z8051 Family history of malignant neoplasm of kidney: Secondary | ICD-10-CM | POA: Insufficient documentation

## 2015-10-01 DIAGNOSIS — Z90710 Acquired absence of both cervix and uterus: Secondary | ICD-10-CM | POA: Diagnosis not present

## 2015-10-01 DIAGNOSIS — Z882 Allergy status to sulfonamides status: Secondary | ICD-10-CM | POA: Diagnosis not present

## 2015-10-01 DIAGNOSIS — Z9049 Acquired absence of other specified parts of digestive tract: Secondary | ICD-10-CM | POA: Insufficient documentation

## 2015-10-01 DIAGNOSIS — Z91030 Bee allergy status: Secondary | ICD-10-CM | POA: Diagnosis not present

## 2015-10-01 DIAGNOSIS — Z9221 Personal history of antineoplastic chemotherapy: Secondary | ICD-10-CM | POA: Insufficient documentation

## 2015-10-01 DIAGNOSIS — Z823 Family history of stroke: Secondary | ICD-10-CM | POA: Diagnosis not present

## 2015-10-01 DIAGNOSIS — Z9889 Other specified postprocedural states: Secondary | ICD-10-CM | POA: Insufficient documentation

## 2015-10-01 DIAGNOSIS — Z85828 Personal history of other malignant neoplasm of skin: Secondary | ICD-10-CM | POA: Insufficient documentation

## 2015-10-01 DIAGNOSIS — C5701 Malignant neoplasm of right fallopian tube: Secondary | ICD-10-CM | POA: Diagnosis not present

## 2015-10-01 DIAGNOSIS — R011 Cardiac murmur, unspecified: Secondary | ICD-10-CM | POA: Diagnosis not present

## 2015-10-01 DIAGNOSIS — Z803 Family history of malignant neoplasm of breast: Secondary | ICD-10-CM | POA: Insufficient documentation

## 2015-10-01 MED ORDER — ESTRADIOL 10 MCG VA TABS: 10.0000 ug | ORAL_TABLET | Freq: Every day | VAGINAL | 2 refills | Status: DC

## 2015-10-01 NOTE — Patient Instructions (Signed)
Plan to follow up with Dr. [name] in September and Dr. Denman George in December or sooner if needed.  Please call for any questions, concerns, or new symptoms.

## 2015-10-01 NOTE — Progress Notes (Signed)
FOLLOW UP VISIT: FALLOPIAN TUBE CANCER  Assessment:    50 y.o. year old with stage IIIC right fallopian tube cancer.   S/p optimal cytoreductive surgery (BSO, omentectomy, ablation of tumor implants) on 03/27/15. S/p adjuvant carboplatin and paclitaxel chemotherapy x 6 cycles with complete response by radiographic and CA 125.  BRCA negative.  Genitourinary atrophy and dryness from menopause.  Plan: I discussed her favorable response to therapy. I discussed that I recommend discontinuing therapy at this point and planning for surveillance examinations. I discussed the symptoms concerning for recurrence and that she should notify us of these should they develop. She was provided with a survivorship plan. We will not schedule routine imaging but will order CA 125 at her next visit in 1month.  Prescribed vagifem for genitourinary menopausal symptoms.  HPI:  Kathryn Ramsey a very pleasant 50year old G4P4 who was originally seen in consultation on 03/17/15 at the request of Kathryn Kathryn Maresand Kathryn CGarwin Brothersfor peritoneal carcinomatosis. The patient has a history of a workup by Ramsey for gross hematuria which included a pelvic examine was suggested for extrinsic mass effect on the bladder on cystoscopy. Cystoscopy took place on in December 2016. The patient denies abdominal pain bloating early satiety or abdominal distention.  On 08/03/2015 at Kathryn Ramsey she underwent a CT scan of the abdomen and pelvis as ordered by Kathryn Ramsey This revealed a 1.4 cm low attenuation lesion on the posterior right hepatic lobe suggestive of a hemangioma. Status post hysterectomy. No ovarian masses. Moderate ascites. Omental caking seen in the lateral left abdomen and pelvis. Peritoneal nodularity in the left lateral pelvic cul-de-sac. No gross extrinsic compression on the bladder was identified on imaging.  The patient was then seen by her gastroenterologist, Kathryn. MCollene Ramsey who performed a colonoscopy which was  unremarkable.  Tumor markers were drawn on 08/04/2015 and these included a CA-125 that was elevated to 957, and a CEA that was normal at 1.  The patient is otherwise a very healthy woman. She is an [employer] trader She has had a history of 4 spontaneous vaginal deliveries. She has a remote history of endometriosis. She is limited history of oral contraceptive pill usage (possibly 2 months). She a history of primary infertility and was treated with Clomid for her first pregnancy.  Her prior endometriosis was identified and treated with 3 laparoscopies. Her only other abdominal surgery was a laparoscopic cholecystectomy, and an LAVH on March 15th 2010 with removal of a right paratubal cyst for a fibroid uterus and menorrhagia. This was performed by Kathryn. SServando Ramsey   She has no remarkable history for malignancy. Her maternal aunt had non-hodgkins lymphoma.  On 03/27/15 she underwent diagnostic laparoscopy, exploratory laparotomy, BSO, omentectomy, argon beam ablation of tumor implants for an optimal cytoreduction (R0) for high grade serous fallopian tube cancer. Final pathology confirmed that her primary tumor was the right fallopian tube.  She had an uncomplicated postoperative stay in hospital and was discharged on POD 2.  She was evaluated in the office on POD 7 for increased abdominal discomfort and feeling a protrusion in her upper abdomen.CT scan confirmed no hernia.  Interval Hx: She went on to receive 6 cycles of carboplatin and paclitaxel adjuvant therapy between 04/14/15 and 08/11/15. She developed severe neuropathy symptoms which necessitated treatment delay of cycle 5 and weekly scheduling of taxol on cycle 6.  CA 125 normalized quickly during primary treatment:  1226 on 03/20/15 165 on 04/28/15 44 on 05/22/15 26.1 on 06/16/15  18.7 on 08/11/15 17 on 7/17/7 15 on 09/29/15.  Post-treatment imaging with CT chest,abdo, pelvis on 09/29/15 showed complete resolution of ascites and  peritoneal implants. There was a stable 1.5cm liver hemangioma present.  Past Medical History:  Diagnosis Date  . Acute sensory neuropathy ([place]) 06/26/2015  . Allergy   . Anemia   . Asthma    illness induced asthma  . Blood transfusion without reported diagnosis    at age 62 years old D/T surgery to femur being crushed  . 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 Heart murmur    pt, states had a "working heart murmur"  . PONV (postoperative nausea and vomiting)   . Skin cancer    Past Surgical History:  Procedure Laterality Date  . 3 laporscopic proceedures    . ABDOMINAL HYSTERECTOMY     2010  . CHOLECYSTECTOMY     2010  . DILATION AND CURETTAGE OF UTERUS     1997  . LAPAROSCOPY N/A 03/27/2015   Procedure: DIAGNOSTIC LAPAROSCOPY ;  Surgeon: Kathryn Amber, MD;  Location: [facility];  Service: Gynecology;  Laterality: N/A;  . LAPAROTOMY N/A 03/27/2015   Procedure: EXPLORATORY LAPAROTOMY, BILATERAL SALPINGO OOPHORECTOMY, OMENTECTOMY, RADICAL TUMOR DEBULKING;  Surgeon: Kathryn Amber, MD;  Location: [facility];  Service: Gynecology;  Laterality: N/A;  . sinus surgery 1994     Family History  Problem Relation Age of Onset  . Hypertension Father   . Skin cancer Father     nonmelanoma skin cancers in his late 11s  . Non-Hodgkin's lymphoma Maternal Aunt     dx. 81s; smoker  . Stroke Maternal Grandmother   . Other Mother     benign meningioma dx. early-mid-70s; hysterectomy in her late 48s for heavy periods - still has ovaries  . Other Son     one son with pre-cancerous skin findings  . Other Daughter     cysts on ovaries and hx of heavy periods  . Other Sister     hysterectomy for cysts - still has ovaries  . Heart attack Maternal Grandfather   . Heart attack Paternal Grandfather   . Renal cancer Maternal Aunt 60    smoker  . Breast cancer Maternal Aunt 78  . Other Maternal Aunt     dx. benign brain tumor  (meningioma) at 30; 2nd benign brain tumor in her late 68s; hx of radical hysterectomy at age 41  . Brain cancer Other     NOS tumor   Social History   Social History  . Marital status: Married    Spouse name: N/A  . Number of children: 4  . 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 Smoking status: Never Smoker  . Smokeless tobacco: Never Used  . Alcohol use Yes     Comment: socially  . Drug use: No  . Sexual activity: Not on file   Other Topics Concern  . Not on file   Social History Narrative   Lives at home w/ husband and children   Right-handed   3 cups caffeine per day   Allergies  Allergen Reactions  . Bee Venom Shortness Of Breath    swelling  . Sulfa Antibiotics Shortness Of Breath    Vomit , diarrhea , hives   Current Outpatient Prescriptions on File Prior to Visit  Medication Sig Dispense Refill  . gabapentin (NEURONTIN) 100 MG capsule Take 1 capsule (100 mg total) by mouth 3 (three) times daily. 90 capsule 2  . clidinium-chlordiazePOXIDE (LIBRAX) 5-2.5 MG capsule Take 1 capsule by mouth at bedtime as needed. Reported on 08/11/2015  0  . dexamethasone (DECADRON) 4 MG tablet Take 4 tablets = 16 mg  with food 12 hrs and 6 hrs prior to Taxol chemotherapy (Patient not taking: Reported on 09/01/2015) 16 tablet 0  . docusate sodium (COLACE) 100 MG capsule Take 100 mg by mouth 2 (two) times daily. Reported on 08/11/2015    . HEMOCYTE 324 (106 Fe) MG TABS tablet Take 1 tablet (106 mg of iron total) by mouth daily. On empty stomach. Take with OJ or vitamin C tablet (Patient not taking: Reported on 07/28/2015) 30 tablet 3  . ibuprofen (ADVIL,MOTRIN) 200 MG tablet Take 400 mg by mouth every 8 (eight) hours as needed for mild pain. Reported on 08/18/2015    . loratadine (CLARITIN) 10 MG tablet Take 10 mg by mouth daily. Begin day of chemotherapy    . LORazepam (ATIVAN) 0.5 MG tablet Take 1 tablet (0.5 mg total) by mouth every  8 (eight) hours as needed for anxiety or sleep. Reported on 04/01/2015 (Patient not taking: Reported on 09/01/2015) 30 tablet 0  . ondansetron (ZOFRAN) 8 MG tablet Take 1 tablet (8 mg total) by mouth every 8 (eight) hours as needed for nausea or vomiting. May use 72 hrs post chemo Treatment (Patient not taking: Reported on 09/01/2015) 30 tablet 1  . oxyCODONE 10 MG TABS Take 1 tablet (10 mg total) by mouth every 4 (four) hours as needed for breakthrough pain. (Patient not taking: Reported on 06/18/2015) 40 tablet 0  . senna (SENOKOT) 8.6 MG TABS tablet Take 2 tablets by mouth at bedtime as needed for mild constipation.     No current facility-administered medications on file prior to visit.     Review of systems: Constitutional:  She has no weight gain or weight loss. She has no fever or chills. Eyes: No blurred vision Ears, Nose, Mouth, Throat: No dizziness, headaches or changes in hearing. No mouth sores. Cardiovascular: No chest pain, palpitations or edema. Respiratory:  No shortness of breath, wheezing or cough Gastrointestinal: She has normal bowel movements without diarrhea or constipation. She denies any nausea or vomiting. She denies blood in her stool or heart burn. Genitourinary:  She denies pelvic pain, pelvic pressure or changes in her urinary function. She has no hematuria, dysuria, or incontinence. She has no irregular vaginal bleeding or vaginal discharge + vaginal dryness Musculoskeletal: Denies muscle weakness or joint pains.  Skin:  She has no skin changes, rashes or itching Neurological:  + peripheral neuropathy Psychiatric:  She denies depression or anxiety. Hematologic/Lymphatic:   No easy bruising or bleeding   Physical Exam: Blood pressure 102/69, pulse (!) 59, temperature 98 F (36.7 C), temperature source Oral, resp. rate 18, height _0  (1.575 Kathryn), weight 122 lb 11.2 oz (55.7 kg), SpO2 100 %. General: Well dressed, well nourished in no apparent distress.   HEENT:   Normocephalic and atraumatic, no lesions.  Extraocular muscles intact. Sclerae anicteric. Pupils equal, round, reactive. No mouth sores or ulcers. Thyroid is normal size, not nodular, midline. Skin:  No lesions or rashes. Lungs:  Clear to auscultation bilaterally.  No wheezes. Cardiovascular:  Regular rate and rhythm.  No murmurs or rubs. Abdomen:  Soft, nontender, nondistended.  No palpable masses.  No hepatosplenomegaly.  No ascites. Normal bowel sounds.  No hernias.  Incision is well healed. Protrusion in upper abdomen consistent with fascial gathering from reapproximation of tissue. Genitourinary: No pelvic masses, normal external genitalia, normal vagina (atrophic), well healed cuff, surgically absent cervix and uterus. Extremities: No cyanosis, clubbing or edema.  No calf tenderness or erythema. No palpable cords. Psychiatric: Mood and affect are appropriate. Neurological: Awake, alert and oriented x 3. Sensation is intact, no neuropathy.  Musculoskeletal: No pain, normal strength and range of motion.   30 minutes of direct face to face counseling time was spent with the patient. This included discussion about prognosis, therapy recommendations and postoperative side effects. [name], MD

## 2015-10-09 ENCOUNTER — Telehealth: Payer: Self-pay | Admitting: Gynecologic Oncology

## 2015-10-09 ENCOUNTER — Other Ambulatory Visit (HOSPITAL_BASED_OUTPATIENT_CLINIC_OR_DEPARTMENT_OTHER): Payer: BLUE CROSS/BLUE SHIELD

## 2015-10-09 ENCOUNTER — Telehealth: Payer: Self-pay

## 2015-10-09 DIAGNOSIS — R319 Hematuria, unspecified: Secondary | ICD-10-CM

## 2015-10-09 DIAGNOSIS — C5701 Malignant neoplasm of right fallopian tube: Secondary | ICD-10-CM | POA: Diagnosis not present

## 2015-10-09 LAB — COMPREHENSIVE METABOLIC PANEL
ALT: 20 U/L (ref 0–55)
AST: 25 U/L (ref 5–34)
Albumin: 4.4 g/dL (ref 3.5–5.0)
Alkaline Phosphatase: 51 U/L (ref 40–150)
Anion Gap: 9 mEq/L (ref 3–11)
BUN: 11.1 mg/dL (ref 7.0–26.0)
CO2: 27 mEq/L (ref 22–29)
Calcium: 10 mg/dL (ref 8.4–10.4)
Chloride: 102 mEq/L (ref 98–109)
Creatinine: 0.7 mg/dL (ref 0.6–1.1)
EGFR: >90 mL/min/{1.73_m2} (ref >90)
Glucose: 113 mg/dl (ref 70–140)
Potassium: 4.1 mEq/L (ref 3.5–5.1)
Sodium: 138 mEq/L (ref 136–145)
Total Bilirubin: 0.57 mg/dL (ref 0.20–1.20)
Total Protein: 7.8 g/dL (ref 6.4–8.3)

## 2015-10-09 LAB — URINALYSIS, MICROSCOPIC - CHCC
Bilirubin (Urine): NEGATIVE
Blood: NEGATIVE
Glucose: NEGATIVE mg/dL
Ketones: NEGATIVE mg/dL
Leukocyte Esterase: NEGATIVE
Nitrite: NEGATIVE
Protein: NEGATIVE mg/dL
RBC / HPF: NEGATIVE (ref 0–2)
Specific Gravity, Urine: 1.005 (ref 1.003–1.035)
Urobilinogen, UR: 0.2 mg/dL (ref 0.2–1)
WBC, UA: NEGATIVE (ref 0–2)
pH: 6 (ref 4.6–8.0)

## 2015-10-09 LAB — CBC WITH DIFFERENTIAL/PLATELET
BASO%: 0.7 % (ref 0.0–2.0)
Basophils Absolute: 0 10*3/uL (ref 0.0–0.1)
EOS%: 1.5 % (ref 0.0–7.0)
Eosinophils Absolute: 0 10*3/uL (ref 0.0–0.5)
HCT: 39.7 % (ref 34.8–46.6)
HGB: 13.2 g/dL (ref 11.6–15.9)
LYMPH%: 41.2 % (ref 14.0–49.7)
MCH: 33.1 pg (ref 25.1–34.0)
MCHC: 33.2 g/dL (ref 31.5–36.0)
MCV: 99.6 fL (ref 79.5–101.0)
MONO#: 0.2 10*3/uL (ref 0.1–0.9)
MONO%: 6.4 % (ref 0.0–14.0)
NEUT#: 1.6 10*3/uL (ref 1.5–6.5)
NEUT%: 50.2 % (ref 38.4–76.8)
Platelets: 238 10*3/uL (ref 145–400)
RBC: 3.98 10*6/uL (ref 3.70–5.45)
RDW: 12.8 % (ref 11.2–14.5)
WBC: 3.2 10*3/uL — ABNORMAL LOW (ref 3.9–10.3)
lymph#: 1.3 10*3/uL (ref 0.9–3.3)

## 2015-10-09 NOTE — Telephone Encounter (Signed)
Left message in Ms [facility] voice mail stating the results of the lab work and U/A as noted below by Dr. [name]. If she has any questions or concerns, she can call back to Dr. Mariana Kaufman office in the morning.

## 2015-10-09 NOTE — Telephone Encounter (Signed)
Patient notified of results.  Advised we will contact her with the results of the culture.  Advised to call for any needs.

## 2015-10-09 NOTE — Telephone Encounter (Signed)
Returned call to patient.  Patient had left message reporting "pink urine."  She states she has noticed it for "a couple of days."  She reports her urine as "more pink" if she has not gone for awhile, including waking up in the am etc.  "When I have a bowel movement that looks normal, it looks like it is pooling in fresh blood in the toilet."  Denies dysuria, fever, or chills but has not been checking her temperature.  She states Monday she felt fine, Tuesday she felt horrible with low energy and Wednesday, she felt somewhat better but when she stands up, her back hurts.  She reports left flank pain different from her upper back pain she has had previously.  She also reports swelling in her hands which she has had since completing chemo.  The swelling is worse in the am and subsides when it is cooler outside and she is up moving around.  Patient advised this information would be passed along to Dr. [name].  Recommending patient to come and have lab appt today to assess her urine but also informed additional labs may be ordered per Dr. [name].

## 2015-10-09 NOTE — Telephone Encounter (Signed)
-----   Message from Gordy Levan, MD sent at 10/09/2015  4:04 PM EDT ----- Please let her know all labs look good - plain UA not remarkable, chemistries all fine, CBC not neutropenic/ hemoglobin and platelets good. We will follow up urine culture until final, but no problems that I can tell here.   Thank you

## 2015-10-10 ENCOUNTER — Encounter: Payer: Self-pay | Admitting: *Deleted

## 2015-10-10 NOTE — Progress Notes (Signed)
[place] Work  Clinical Social Work received voicemail from patient. Clinical Social Worker contacted patient by phone to offer support and assess for needs.  The patient reported she is seeking support surrounding survivorship issues, such as "What next?" after completing treatment.  CSW reviewed several options such as meeting with CSW for adjustment counseling, counseling from [facility] counseling intern, and upcoming Finding Your New Normal survivorship class.  Patient was very interested in counseling and class, plans to follow up with CSW.    Polo Riley, MSW, LCSW, OSW-C Clinical Social Worker [facility] [phone]

## 2015-10-11 LAB — URINE CULTURE

## 2015-10-13 ENCOUNTER — Telehealth: Payer: Self-pay | Admitting: Gynecologic Oncology

## 2015-10-13 NOTE — Telephone Encounter (Signed)
Called and informed patient of urine culture results.  Patient stating she noticed no pink colored urine over the weekend.  Patient stating one to two days prior to calling our office with complaints of hematuria, she had been drinking beet juice.  Asking if that could have possibly changed the color of her urine.  She states she may start drinking the juice tomorrow and see if there are any changes in the color of her urine.  She continues to report moderate generalized body aches.  Advised to call for any needs or concerns.  Will forward this info to Dr. [name].  Patient advised to call our office with an update.

## 2015-10-26 ENCOUNTER — Encounter: Payer: Self-pay | Admitting: Oncology

## 2015-10-26 ENCOUNTER — Other Ambulatory Visit: Payer: Self-pay | Admitting: Oncology

## 2015-10-26 NOTE — Progress Notes (Signed)
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 Fatigue   FOLLOW UP PLANS:  restaging CT CAP 09-29-15 Follow up gyn oncology and medical oncology

## 2015-10-27 ENCOUNTER — Ambulatory Visit (HOSPITAL_BASED_OUTPATIENT_CLINIC_OR_DEPARTMENT_OTHER): Payer: BLUE CROSS/BLUE SHIELD | Admitting: Oncology

## 2015-10-27 ENCOUNTER — Telehealth: Payer: Self-pay | Admitting: Oncology

## 2015-10-27 ENCOUNTER — Encounter: Payer: Self-pay | Admitting: Oncology

## 2015-10-27 ENCOUNTER — Other Ambulatory Visit (HOSPITAL_BASED_OUTPATIENT_CLINIC_OR_DEPARTMENT_OTHER): Payer: BLUE CROSS/BLUE SHIELD

## 2015-10-27 VITALS — BP 119/80 | HR 72 | Temp 98.1°F | Resp 18 | Ht 62.0 in | Wt 128.4 lb

## 2015-10-27 DIAGNOSIS — M79671 Pain in right foot: Secondary | ICD-10-CM

## 2015-10-27 DIAGNOSIS — M79672 Pain in left foot: Secondary | ICD-10-CM

## 2015-10-27 DIAGNOSIS — T451X5A Adverse effect of antineoplastic and immunosuppressive drugs, initial encounter: Secondary | ICD-10-CM

## 2015-10-27 DIAGNOSIS — C5701 Malignant neoplasm of right fallopian tube: Secondary | ICD-10-CM

## 2015-10-27 DIAGNOSIS — D72819 Decreased white blood cell count, unspecified: Secondary | ICD-10-CM | POA: Diagnosis not present

## 2015-10-27 DIAGNOSIS — D701 Agranulocytosis secondary to cancer chemotherapy: Secondary | ICD-10-CM

## 2015-10-27 DIAGNOSIS — G620 Drug-induced polyneuropathy: Secondary | ICD-10-CM

## 2015-10-27 LAB — CBC WITH DIFFERENTIAL/PLATELET
BASO%: 0.4 % (ref 0.0–2.0)
Basophils Absolute: 0 10*3/uL (ref 0.0–0.1)
EOS%: 1.1 % (ref 0.0–7.0)
Eosinophils Absolute: 0 10*3/uL (ref 0.0–0.5)
HCT: 36.4 % (ref 34.8–46.6)
HGB: 12.3 g/dL (ref 11.6–15.9)
LYMPH%: 52.7 % — ABNORMAL HIGH (ref 14.0–49.7)
MCH: 32.9 pg (ref 25.1–34.0)
MCHC: 33.7 g/dL (ref 31.5–36.0)
MCV: 97.6 fL (ref 79.5–101.0)
MONO#: 0.3 10*3/uL (ref 0.1–0.9)
MONO%: 8.7 % (ref 0.0–14.0)
NEUT#: 1.1 10*3/uL — ABNORMAL LOW (ref 1.5–6.5)
NEUT%: 37.1 % — ABNORMAL LOW (ref 38.4–76.8)
Platelets: 232 10*3/uL (ref 145–400)
RBC: 3.73 10*6/uL (ref 3.70–5.45)
RDW: 12.7 % (ref 11.2–14.5)
WBC: 3 10*3/uL — ABNORMAL LOW (ref 3.9–10.3)
lymph#: 1.6 10*3/uL (ref 0.9–3.3)

## 2015-10-27 LAB — COMPREHENSIVE METABOLIC PANEL
ALT: 17 U/L (ref 0–55)
AST: 19 U/L (ref 5–34)
Albumin: 4 g/dL (ref 3.5–5.0)
Alkaline Phosphatase: 48 U/L (ref 40–150)
Anion Gap: 10 mEq/L (ref 3–11)
BUN: 11.6 mg/dL (ref 7.0–26.0)
CO2: 26 mEq/L (ref 22–29)
Calcium: 9.6 mg/dL (ref 8.4–10.4)
Chloride: 102 mEq/L (ref 98–109)
Creatinine: 0.7 mg/dL (ref 0.6–1.1)
EGFR: >90 mL/min/{1.73_m2} (ref >90)
Glucose: 100 mg/dl (ref 70–140)
Potassium: 4.1 mEq/L (ref 3.5–5.1)
Sodium: 138 mEq/L (ref 136–145)
Total Bilirubin: 0.52 mg/dL (ref 0.20–1.20)
Total Protein: 7.4 g/dL (ref 6.4–8.3)

## 2015-10-27 NOTE — Telephone Encounter (Signed)
appt made and avs printed °

## 2015-10-27 NOTE — Progress Notes (Signed)
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 First 4 cycles were q 3 week carbo taxol with gCSF support; cycle 5 was carboplatin only due to unusual neurologic symptoms, and cycle 6 was dose dense carbo taxol.  She had CT CAP on 09-29-15, with no apparent metastatic or residual disease.  She saw Dr Denman George on 10-01-15. She will have follow up visits every 3 months initially, to include CA 125 marker.   Patient has persistent neuropathy symptoms in UE, moreso on left, which wakens her from sleep. The sensory neuropathy involves all fingers and extends up lateral aspect of left forearm to elbow; the symptoms are better with activity. She does not notice any decreased strength in UE, and back/ face symptoms are essentially resolved. Per patient, Dr Jannifer Franklin feels that symptoms may be progressive since completion of chemotherapy, tho this would not be expected at least from my experience. She complains of pain in feet on initially standing when she gets up in AM, and also after sitting in car etc. The pain in feet resolves after she is up walking. She feels that hands and fingers are swollen, unable to wear rings now, and stiff. She is not using gabapentin, as this did not seem helpful and caused daytime sedation. Energy is improved tho not back to baseline. She walks for exercise ~ 2x weekly and is doing stretch yoga 2-3x weekly, both of which seem helpful. Appetite is good and bowels are moving about as her usual somewhat difficult baseline. She feels better when she stays well hydrated. She denies fever or symptoms of infection including any recent viral illness, bleeding, LE  swelling, SOB, new or different pain otherwise. No urinary symptoms now.  Remainder of 10 point Review of Systems negative.     CA 125 1226 on 03-20-2015 No central line Genetics testing negative by Breast Ovarian panel by [facility] sent 05-05-15, tho VUS with PALB2.  ONCOLOGIC HISTORY  Patient has been generally healthy, post vaginal hysterectomy for fibroids and menorrhagia 2012. In ~ 11-2014 she noticed bladder pressure and occasional trace blood ? Vaginal vs urinary. She was seen by Dr Pilar Jarvis, with cystoscopy 01-31-15 suggesting extrinsic compression of bladder. She had CT AP at [facility] 03-04-14, with moderate ascites, omental caking in lateral left abdomen and pelvis, absent uterus, "ovaries are likely visualized" , no adenopathy, no hydronephrosis or renal stones, lung bases clear, hemangioma posterior right hepatic lobe (CT report to be scanned into this EMR). CA 125 by Dr Collene Mares 03-06-15 was 957, with CEA 1.0. She had colonoscopy by Dr Collene Mares on 03-12-15, reportedly unremarkable. She had consultation with Dr Denman George on 03-17-15, with plan for initial surgery if amenable to optimal debulking, otherwise to begin with neoadjuvant chemotherapy. CT chest 03-21-15 no findings of concern. She had exploratory laparotomy with BSO, omentectomy and radical R0 debulking on 03-27-15, pathology ([id number]) high grade serous carcinoma involving bilateral tubes and ovaries, omentum and peritoneal nodule, final diagnosis IIIC right fallopian. She began chemotherapy with carboplatin and taxol on 04-14-15. She was neutropenic by day 8 cycle 1, ANC 0.9. Cycle 5 was delayed with unusual neuropathy symptoms and cytopenias, given as carboplatin only on 07-21-15 with neulasta. Cycle 6 began 08-11-15  using weekly taxol and granix support.    Review of systems as above, also:  Remainder of 10 point Review of Systems negative.  Objective:  Vital signs in last 24 hours:  BP 119/80 (BP Location: Right Arm, Patient Position:  Sitting)   Pulse 72   Temp 98.1 F (36.7 C) (Oral)   Resp 18   Ht '5\' 2"'  (1.575 m)   Wt 128 lb 6.4 oz (58.2 kg)   SpO2 100%   BMI 23.48 kg/m  Weight up 5 lbs Alert, oriented and appropriate. Ambulatory without difficulty. Wearing flat shoes without arch support Hair is growing back  HEENT:PERRL, sclerae not icteric. Oral mucosa moist without lesions, posterior pharynx clear.  Neck supple. No JVD.  Lymphatics:no cervical,supraclavicular, axillary or inguinal adenopathy Resp: clear to auscultation bilaterally and normal percussion bilaterally Cardio: regular rate and rhythm. No gallop. GI: soft, nontender, not distended, no mass or organomegaly. Normally active bowel sounds. Surgical incision not remarkable. Musculoskeletal/ Extremities: UE/ LE without pitting edema, cords, tenderness. Fingers slightly puffy, not clear otherwise in hands to my exam. Back not tender.  Neuro: silght peripheral sensory neuropathy all of left fingers and lateral forearm. Strength equal and symmetrical UE.  Otherwise nonfocal Skin without rash, ecchymosis, petechiae   Lab Results:  Results for orders placed or performed in visit on 10/27/15  CBC with Differential  Result Value Ref Range   WBC 3.0 (L) 3.9 - 10.3 10e3/uL   NEUT# 1.1 (L) 1.5 - 6.5 10e3/uL   HGB 12.3 11.6 - 15.9 g/dL   HCT 36.4 34.8 - 46.6 %   Platelets 232 145 - 400 10e3/uL   MCV 97.6 79.5 - 101.0 fL   MCH 32.9 25.1 - 34.0 pg   MCHC 33.7 31.5 - 36.0 g/dL   RBC 3.73 3.70 - 5.45 10e6/uL   RDW 12.7 11.2 - 14.5 %   lymph# 1.6 0.9 - 3.3 10e3/uL   MONO# 0.3 0.1 - 0.9 10e3/uL   Eosinophils Absolute 0.0 0.0 - 0.5 10e3/uL   Basophils Absolute 0.0 0.0 - 0.1 10e3/uL   NEUT% 37.1 (L) 38.4 - 76.8 %   LYMPH% 52.7 (H) 14.0 - 49.7 %   MONO% 8.7 0.0 - 14.0 %   EOS% 1.1 0.0 - 7.0 %   BASO% 0.4 0.0 - 2.0 %  Comprehensive metabolic panel  Result Value Ref Range   Sodium 138 136 - 145 mEq/L   Potassium 4.1 3.5 - 5.1 mEq/L   Chloride 102 98 - 109  mEq/L   CO2 26 22 - 29 mEq/L   Glucose 100 70 - 140 mg/dl   BUN 11.6 7.0 - 26.0 mg/dL   Creatinine 0.7 0.6 - 1.1 mg/dL   Total Bilirubin 0.52 0.20 - 1.20 mg/dL   Alkaline Phosphatase 48 40 - 150 U/L   AST 19 5 - 34 U/L   ALT 17 0 - 55 U/L   Total Protein 7.4 6.4 - 8.3 g/dL   Albumin 4.0 3.5 - 5.0 g/dL   Calcium 9.6 8.4 - 10.4 mg/dL   Anion Gap 10 3 - 11 mEq/L   EGFR >90 >90 ml/min/1.73 m2  CA 125  Result Value Ref Range   Cancer Antigen (CA) 125 15.5 0.0 - 38.1 U/mL  Sedimentation rate  Result Value Ref Range   Sedimentation Rate-Westergren 3 0 - 40 mm/hr  C-reactive protein  Result Value Ref Range   CRP <0.3 0.0 - 4.9 mg/L  Rheumatoid factor  Result Value Ref Range   RA  Latex Turbid. <10.0 0.0 - 13.9 IU/mL   Urine culture 10-09-15 insignificant growth. CA 125 resulted after visit as above, will be communicated to patient ESR, CRP and rheumatoid factor added to blood already drawn today, will sent results to PCP  Studies/Results: EXAM: CT CHEST, ABDOMEN, AND PELVIS WITH CONTRAST   09-29-15  TECHNIQUE: Multidetector CT imaging of the chest, abdomen and pelvis was performed following the standard protocol during bolus administration of intravenous contrast.  CONTRAST:  67m ISOVUE-300 IOPAMIDOL (ISOVUE-300) INJECTION 61%  COMPARISON:  CT of the chest 03/21/2015. CT the abdomen and pelvis 03/05/2015.  FINDINGS: CT CHEST FINDINGS  Cardiovascular: Heart size is normal. There is no significant pericardial fluid, thickening or pericardial calcification. 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  Mediastinum/Nodes: No pathologically enlarged mediastinal or hilar lymph nodes. Esophagus is unremarkable in appearance. No axillary lymphadenopathy.  Lungs/Pleura: No suspicious appearing pulmonary nodules or masses are noted. No acute consolidative airspace disease. No pleural effusions. Nodular bilateral apical pleuroparenchymal  thickening, most compatible with chronic post infectious or inflammatory scarring, very similar to the prior examination.  Musculoskeletal: There are no aggressive appearing lytic or blastic lesions noted in the visualized portions of the skeleton.  CT ABDOMEN PELVIS FINDINGS  Hepatobiliary: Again noted is a small 12 mm lesion in the posterior aspect of the right lobe of the liver at junction of segments 6 and 7 (image 55 of series 2) which demonstrates some peripheral nodular enhancement with filling on delayed images, compatible with a cavernous hemangioma. No new suspicious hepatic lesions are otherwise noted. No intra or extrahepatic biliary ductal dilatation. Status post cholecystectomy.  Pancreas: No pancreatic mass. No pancreatic ductal dilatation. No pancreatic or peripancreatic fluid or inflammatory changes.  Spleen: Unremarkable.  Adrenals/Urinary Tract: Bilateral kidneys and bilateral adrenal glands are normal in appearance. No hydroureteronephrosis. Urinary bladder is normal in appearance.  Stomach/Bowel: The appearance of the stomach is normal. There is no pathologic dilatation of small bowel or colon. The appendix is not confidently identified and may be surgically absent. Regardless, there are no inflammatory changes noted adjacent to the cecum to suggest the presence of an acute appendicitis at this time.  Vascular/Lymphatic: No significant atherosclerotic disease, aneurysm or dissection. No lymphadenopathy noted in the abdomen or pelvis.  Reproductive: Status post total abdominal hysterectomy and bilateral salpingo-oophorectomy.  Other: Previously noted peritoneal implants are no longer confidently identified. Compared to the prior study there has likely been an interval omentectomy and debulking procedure. No significant volume of ascites. No pneumoperitoneum.  Musculoskeletal: There are no aggressive appearing lytic or blastic lesions noted in  the visualized portions of the skeleton.  IMPRESSION: 1. All previously noted intraperitoneal metastatic disease appears to have been resected and/or has regressed. No definite intraperitoneal metastatic lesions identified on today's examination. 2. Complete resolution of previously noted malignant ascites. 3. No definite metastatic lesions in the solid viscera of the abdomen. No definite intrathoracic metastasis. 4. Small cavernous hemangioma in the posterior aspect of the right lobe of the liver again incidentally noted.  PACs images reviewed by MD  Medications: I have reviewed the patient's current medications. I would prefer she wait another few weeks prior to flu vaccine, particularly with slightly low ANC today   DISCUSSION Clinically recovering from adjuvant therapy for IIIC fallopian carcinoma, with exception of atypical neurologic symptoms LUE>RUE and symptoms in feet. No evidence of disease by marker/ scans/ exam. Patient understands that generally patients feel essentially back to baseline energy after ~ 3-6 months.  Hands including fingers stiff and puffy, ANC lower today so question of rheumatologic etiology, screening tests (ESR ,CRP, RF) added to blood already drawn and will be sent to her PCP. I have suggested that she make return appointment also to PCP.  Description of pain on standing that resolves with activity suggests possible plantar fasciitis, which should improve with interventions if so. Patient is willing to have evaluation by podiatrist, referral made to Dr Trudie Buckler at her request. Discussed shoes with arch support.  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 Adjuvant carboplatin taxol x 6 cycles from 04-14-15 thru 08-25-15, with gCSF support and adjustments for neurologic concerns. Marker normalized and NED by CT  CAP 09-29-15.  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 Every 3 month follow up with CA 125 marker.  Patient requests next gyn onc visit late Nov due to insurance deductible met thru end of Nov.  2. Paresthesias in face and jaw, left shoulder/ LUE: first noticed week prior to #4 chemo, then increased after #4 chemo. Extensive evaluation by neurology did not identify cause other than chemo. No problems with single agent carbo cycle 5, then progressive increase face, neck, thighs with weekly x 3 taxol used cycle 6.  Face and back better, UE not fully resolved. 3.chemo neutropenia requiring gCSF. Chemo leukopenia now, will recheck at least with next gyn onc visit.  4.irritable bowel syndrome x years, known to Dr Collene Mares, at baseline. 5. Peripheral IV access adequate for all of chemo 6.heterogeneously dense breast tissue including most recent 3D mammograms at [place] 03-26-15 7. Post cholecystecytomy 8.surgery for crushed left femur age 48 9. post hysterectomy for benign indications 2012. History of endometriosis. 10. Restlessness with IV benadryl 11.pain bottoms of feet on standing, resolves with activity, ? Plantar fasciitis. Refer to podiatry 12.swelling and stiffness fingers, slightly low ANC: check screening rheumatology labs.    All questions answered and patient is in agreement with recommendations and plans. Route PCP, cc Dr Jannifer Franklin, Dr Denman George and update Dr Garwin Brothers. CC podiatry for new patient referral. Time spent 25 min including >50% counseling and coordination of care   Evlyn Clines, MD   10/28/2015, 6:37 PM

## 2015-10-28 ENCOUNTER — Telehealth: Payer: Self-pay

## 2015-10-28 LAB — RHEUMATOID FACTOR: RA Latex Turbid.: <10 IU/mL (ref 0.0–13.9)

## 2015-10-28 LAB — SEDIMENTATION RATE: Sedimentation Rate-Westergren: 3 mm/hr (ref 0–40)

## 2015-10-28 LAB — C-REACTIVE PROTEIN: CRP: <0.3 mg/L (ref 0.0–4.9)

## 2015-10-28 LAB — CA 125: Cancer Antigen (CA) 125: 15.5 U/mL (ref 0.0–38.1)

## 2015-10-28 NOTE — Telephone Encounter (Signed)
-----   Message from Gordy Levan, MD sent at 10/28/2015  3:36 PM EDT ----- Labs seen and need follow up: please let her know CA 125 marker is stable in low range at 15. Screening tests for rheumatologic problems all negative. Still would be a good idea to schedule follow up with PCP.

## 2015-10-28 NOTE — Telephone Encounter (Signed)
LM in Ms [facility] VM stating the results as noted below by Dr. [name]. Ms Mayweather can call back to Dr. Mariana Kaufman office if she has any questions or concerns.

## 2015-11-03 ENCOUNTER — Encounter: Payer: Self-pay | Admitting: *Deleted

## 2015-11-03 NOTE — Progress Notes (Signed)
Kathryn Ramsey  Clinical Social Ramsey met with patient in [place] office for counseling session.  Patient verbalized feelings of survivor guilt, fear of recurrence, and discussed emotionally processing experience after treatment was completed.  CSW provided brief support and validated patient's feelings of guilt, sadness, and anxiety.  CSW discussed common reactions to life after treatment for many survivors.  CSW and patient also discussed family dynamics related to the cancer experience.  Patient plans to attend the Finding Your New Normal class and CSW encouraged patient to attend GYN support group.  Polo Riley, MSW, LCSW, OSW-C Clinical Social Worker [facility] [phone]

## 2015-11-10 ENCOUNTER — Ambulatory Visit: Payer: BLUE CROSS/BLUE SHIELD | Admitting: Podiatry

## 2015-11-19 ENCOUNTER — Other Ambulatory Visit: Payer: Self-pay | Admitting: Family Medicine

## 2015-11-19 DIAGNOSIS — M549 Dorsalgia, unspecified: Secondary | ICD-10-CM

## 2015-11-22 ENCOUNTER — Ambulatory Visit
Admission: RE | Admit: 2015-11-22 | Discharge: 2015-11-22 | Disposition: A | Discharge status: Home / Self Care | Payer: BLUE CROSS/BLUE SHIELD | Source: Ambulatory Visit | Attending: Family Medicine | Admitting: Family Medicine

## 2015-11-22 DIAGNOSIS — M549 Dorsalgia, unspecified: Secondary | ICD-10-CM

## 2015-11-22 MED ORDER — GADOBENATE DIMEGLUMINE 529 MG/ML IV SOLN
11.0000 mL | Freq: Once | INTRAVENOUS | Status: AC | PRN
  Administered 2015-11-22: 11 mL via INTRAVENOUS

## 2015-11-24 ENCOUNTER — Telehealth: Payer: Self-pay | Admitting: Neurology

## 2015-11-24 NOTE — Telephone Encounter (Signed)
Scheduled

## 2015-11-24 NOTE — Telephone Encounter (Signed)
I called patient. She is having ongoing increasing pain in the left shoulder blade, occasionally radiates to the shoulder itself, generally does not go down arm. She has had MRI evaluation of the cervical and thoracic spine, no evidence of nerve root or spinal cord compression.  We will get her worked in on the 11th of October at 7:30 AM.

## 2015-11-26 ENCOUNTER — Ambulatory Visit (INDEPENDENT_AMBULATORY_CARE_PROVIDER_SITE_OTHER): Payer: BLUE CROSS/BLUE SHIELD | Admitting: Neurology

## 2015-11-26 ENCOUNTER — Encounter: Payer: Self-pay | Admitting: Neurology

## 2015-11-26 VITALS — BP 106/65 | HR 63 | Resp 20 | Ht 62.5 in | Wt 126.0 lb

## 2015-11-26 DIAGNOSIS — G610 Guillain-Barre syndrome: Secondary | ICD-10-CM | POA: Diagnosis not present

## 2015-11-26 DIAGNOSIS — G608 Other hereditary and idiopathic neuropathies: Secondary | ICD-10-CM

## 2015-11-26 MED ORDER — VENLAFAXINE HCL ER 37.5 MG PO CP24: ORAL_CAPSULE | ORAL | 2 refills | Status: DC

## 2015-11-26 NOTE — Progress Notes (Signed)
Reason for visit: Paresthesias  Kathryn Ramsey is an 50 y.o. female  History of present illness:  Kathryn Ramsey is a 50 year old right-handed white female with a history of ovarian cancer, status post chemotherapy with carboplatin and Taxol. The patient has developed a Taxol related ganglion neuropathy with paresthesias along the back of the head and neck, with a dull achy pain in the left intrascapular area, and paresthesias in the hands and feet. The patient has experienced a "coasting phenomenon" with the dysesthesias peeking in severity in September 2017. She is now starting to feel somewhat better, she is on gabapentin but she cannot tolerate doses higher than 100 mg at night, she cannot take the medication during the day secondary to drowsiness. She has recently been placed on Effexor but she has not picked up the prescription yet. The patient denies any problems with weakness, coordination or clumsiness, or with gait instability. She denies any issues controlling the bowels or the bladder. She comes to this office for an evaluation.  Past Medical History:  Diagnosis Date  . Acute sensory neuropathy ([place]) 06/26/2015  . Allergy   . Anemia   . Asthma    illness induced asthma  . Blood transfusion without reported diagnosis    at age 82 years old D/T surgery to femur being crushed  . 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 Heart murmur    pt, states had a "working heart murmur"  . PONV (postoperative nausea and vomiting)   . Skin cancer     Past Surgical History:  Procedure Laterality Date  . 3 laporscopic proceedures    . ABDOMINAL HYSTERECTOMY     2010  . CHOLECYSTECTOMY     2010  . DILATION AND CURETTAGE OF UTERUS     1997  . LAPAROSCOPY N/A 03/27/2015   Procedure: DIAGNOSTIC LAPAROSCOPY ;  Surgeon: [name], MD;  Location: [facility];  Service: Gynecology;  Laterality: N/A;  . LAPAROTOMY N/A 03/27/2015   Procedure: EXPLORATORY LAPAROTOMY, BILATERAL SALPINGO OOPHORECTOMY, OMENTECTOMY, RADICAL TUMOR DEBULKING;  Surgeon: [name], MD;  Location: [facility];  Service: Gynecology;  Laterality: N/A;  . sinus surgery 1994      Family History  Problem Relation Age of Onset  . Hypertension Father   . Skin cancer Father     nonmelanoma skin cancers in his late 57s  . Non-Hodgkin's lymphoma Maternal Aunt     dx. 82s; smoker  . Stroke Maternal Grandmother   . Other Mother     benign meningioma dx. early-mid-70s; hysterectomy in her late 11s for heavy periods - still has ovaries  . Other Son     one son with pre-cancerous skin findings  . Other Daughter     cysts on ovaries and hx of heavy periods  . Other Sister     hysterectomy for cysts - still has ovaries  . Heart attack Maternal Grandfather   . Heart attack Paternal Grandfather   . Renal cancer Maternal Aunt 60    smoker  . Breast cancer Maternal Aunt 78  . Other Maternal Aunt     dx. benign brain tumor (meningioma) at 50; 2nd benign brain tumor in her late 1s; hx of radical hysterectomy at age 43  . Brain cancer Other     NOS tumor    Social history:  reports that she has never smoked. She has never used smokeless  tobacco. She reports that she drinks alcohol. She reports that she does not use drugs.    Allergies  Allergen Reactions  . Bee Venom Shortness Of Breath    swelling  . Sulfa Antibiotics Shortness Of Breath    Vomit , diarrhea , hives    Medications:  Prior to Admission medications   Medication Sig Start Date End Date Taking? Authorizing Provider  calcium citrate-vitamin D (CITRACAL+D) 315-200 MG-UNIT tablet Take 1 tablet by mouth 2 (two) times daily.   Yes Historical Provider, MD  Cholecalciferol LIQD Take 1 spray by mouth daily.   Yes Historical Provider, MD  clidinium-chlordiazePOXIDE (LIBRAX) 5-2.5 MG capsule Take 1 capsule by mouth at bedtime as needed. Reported on 08/11/2015 03/12/15  Yes Historical Provider, MD    gabapentin (NEURONTIN) 100 MG capsule Take 1 capsule (100 mg total) by mouth 3 (three) times daily. 07/18/15  Yes [name], MD  ibuprofen (ADVIL,MOTRIN) 200 MG tablet Take 400 mg by mouth every 8 (eight) hours as needed for mild pain. Reported on 08/18/2015   Yes Historical Provider, MD  loratadine (CLARITIN) 10 MG tablet Take 10 mg by mouth daily. Begin day of chemotherapy 04/14/15  Yes Historical Provider, MD  LORazepam (ATIVAN) 0.5 MG tablet Take 1 tablet (0.5 mg total) by mouth every 8 (eight) hours as needed for anxiety or sleep. Reported on 04/01/2015 07/21/15  Yes Lennis Marion Downer, MD  TURMERIC PO Take 1 tablet by mouth daily.   Yes Historical Provider, MD  vitamin B-12 (CYANOCOBALAMIN) 500 MCG tablet Take 500 mcg by mouth daily.   Yes Historical Provider, MD  venlafaxine XR (EFFEXOR-XR) 75 MG 24 hr capsule Take 75 mg by mouth daily. 11/24/15   Historical Provider, MD    ROS:  Out of a complete 14 system review of symptoms, the patient complains only of the following symptoms, and all other reviewed systems are negative.  Joint pain, back pain, achy muscles Numbness  Blood pressure 106/65, pulse 63, resp. rate 20, height 5' 2.5" (1.588 m), weight 126 lb (57.2 kg).  Physical Exam  General: The patient is alert and cooperative at the time of the examination.  Skin: No significant peripheral edema is noted.   Neurologic Exam  Mental status: The patient is alert and oriented x 3 at the time of the examination. The patient has apparent normal recent and remote memory, with an apparently normal attention span and concentration ability.   Cranial nerves: Facial symmetry is present. Speech is normal, no aphasia or dysarthria is noted. Extraocular movements are full. Visual fields are full.  Motor: The patient has good strength in all 4 extremities.  Sensory examination: Soft touch sensation is symmetric on the face, arms, and legs. Vibration and position sense are intact on all 4  extremities.  Coordination: The patient has good finger-nose-finger and heel-to-shin bilaterally.  Gait and station: The patient has a normal gait. Tandem gait is normal. Romberg is negative. No drift is seen.  Reflexes: Deep tendon reflexes are symmetric, and are normal.   Assessment/Plan:  1. Sensory ganglion neuropathy  The patient likely has had a "coasting phenomenon" following chemotherapy. She has started to improve some at this point with the sensory symptoms. We will get her on Effexor to help with the discomfort. A prescription was called in for a 37.5 mg tablet to take 1 daily for 2 weeks, then go to 75 mg daily. She will contact me if a dose adjustment is required. She will follow-up if needed.  Jill Alexanders MD 11/26/2015 8:08 AM  [facility] [address] [place], [place] [address]  Phone [phone] Fax [phone]

## 2015-12-08 ENCOUNTER — Encounter: Payer: BLUE CROSS/BLUE SHIELD | Admitting: Nutrition

## 2015-12-08 NOTE — Progress Notes (Signed)
Nutrition Assessment  Reason for Assessment:  Pt pt of MD [place] with questions regarding diet and herbal/nutritional supplements.  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  INTERVENTION:  1) Pt with questions regarding Juice Plus and additional supplements/herbal treatments.  Encouraged pt to discuss any medications/herbals with MD.  Discussed risk/benefit of medications/herbals. Will follow-up regarding dosage of K and provide further guidance.   2) Encouraged intake of plant based diet including legumes for protein, whole grains, as well as lean meats, and use of fresh herbs seasoning.  Handout provided.   3) Encouraged exercise to maintain healthy wt and lean muscle mass.     MONITORING, EVALUATION, and GOAL: Pt will consume a healthy plant based diet to maintain lean body mass for survivorship.    Seyed Heffley B. Zenia Resides, [place], [place] (pager)

## 2015-12-08 NOTE — Progress Notes (Signed)
Telephone Contact:  Called pt after more in depth review of supplementation.   Reviewed K labs back till 04/2008 and noted normal K level  Pt reports K was not MD prescribed. Cautioned pt about taking excess K side effects discussed.  Encouraged pt to take K if prescribed by MD because will need close monitoring to make sure level does not increase to unsafe level.  Pt verbalized understanding and will stop taking supplement unless prescribed by MD.    Desmond Lope B. Zenia Resides, [place], [place] (page)

## 2015-12-23 ENCOUNTER — Telehealth: Payer: Self-pay

## 2015-12-23 NOTE — Telephone Encounter (Signed)
Received refill request for Ativan 0.5 mg tabs # 30 LF on 08-15-15. Ms Kathryn Ramsey uses this ativan occasionally for sleep.  She is not out of medication but thought it would take some time to get medication refilled. Since her treatment are completed suggested that she speak with her PCP to prescribe and follow since she is using it for sleep.  Ms Selvera verbalized understanding. No refill sent to her pharmacy.

## 2015-12-26 ENCOUNTER — Encounter (HOSPITAL_COMMUNITY): Payer: Self-pay

## 2016-01-06 ENCOUNTER — Telehealth: Payer: Self-pay | Admitting: Gynecologic Oncology

## 2016-01-06 NOTE — Telephone Encounter (Signed)
Patient called and left message stating she has been having a dull headache and she wants "full blood work."  She is set for CBC, Cmet, and CA 125.  Left message for patient with this info.

## 2016-01-07 ENCOUNTER — Other Ambulatory Visit (HOSPITAL_BASED_OUTPATIENT_CLINIC_OR_DEPARTMENT_OTHER): Payer: BLUE CROSS/BLUE SHIELD

## 2016-01-07 DIAGNOSIS — C5701 Malignant neoplasm of right fallopian tube: Secondary | ICD-10-CM

## 2016-01-07 LAB — COMPREHENSIVE METABOLIC PANEL
ALT: 15 U/L (ref 0–55)
AST: 20 U/L (ref 5–34)
Albumin: 4.3 g/dL (ref 3.5–5.0)
Alkaline Phosphatase: 58 U/L (ref 40–150)
Anion Gap: 12 mEq/L — ABNORMAL HIGH (ref 3–11)
BUN: 16.4 mg/dL (ref 7.0–26.0)
CO2: 23 mEq/L (ref 22–29)
Calcium: 10.1 mg/dL (ref 8.4–10.4)
Chloride: 102 mEq/L (ref 98–109)
Creatinine: 0.8 mg/dL (ref 0.6–1.1)
EGFR: 90 mL/min/{1.73_m2} — ABNORMAL LOW (ref >90)
Glucose: 91 mg/dl (ref 70–140)
Potassium: 4.2 mEq/L (ref 3.5–5.1)
Sodium: 137 mEq/L (ref 136–145)
Total Bilirubin: 0.6 mg/dL (ref 0.20–1.20)
Total Protein: 7.9 g/dL (ref 6.4–8.3)

## 2016-01-07 LAB — CBC WITH DIFFERENTIAL/PLATELET
BASO%: 0.3 % (ref 0.0–2.0)
Basophils Absolute: 0 10*3/uL (ref 0.0–0.1)
EOS%: 1.3 % (ref 0.0–7.0)
Eosinophils Absolute: 0.1 10*3/uL (ref 0.0–0.5)
HCT: 38.4 % (ref 34.8–46.6)
HGB: 13.3 g/dL (ref 11.6–15.9)
LYMPH%: 45.4 % (ref 14.0–49.7)
MCH: 31.7 pg (ref 25.1–34.0)
MCHC: 34.6 g/dL (ref 31.5–36.0)
MCV: 91.4 fL (ref 79.5–101.0)
MONO#: 0.4 10*3/uL (ref 0.1–0.9)
MONO%: 10.6 % (ref 0.0–14.0)
NEUT#: 1.7 10*3/uL (ref 1.5–6.5)
NEUT%: 42.4 % (ref 38.4–76.8)
Platelets: 219 10*3/uL (ref 145–400)
RBC: 4.2 10*6/uL (ref 3.70–5.45)
RDW: 12.5 % (ref 11.2–14.5)
WBC: 3.9 10*3/uL (ref 3.9–10.3)
lymph#: 1.8 10*3/uL (ref 0.9–3.3)

## 2016-01-08 LAB — CA 125: Cancer Antigen (CA) 125: 69.1 U/mL — ABNORMAL HIGH (ref 0.0–38.1)

## 2016-01-12 ENCOUNTER — Encounter: Payer: Self-pay | Admitting: Gynecologic Oncology

## 2016-01-12 ENCOUNTER — Ambulatory Visit: Payer: BLUE CROSS/BLUE SHIELD | Attending: Gynecologic Oncology | Admitting: Gynecologic Oncology

## 2016-01-12 VITALS — BP 120/71 | HR 78 | Temp 98.1°F | Resp 18 | Ht 62.5 in | Wt 122.6 lb

## 2016-01-12 DIAGNOSIS — Z9889 Other specified postprocedural states: Secondary | ICD-10-CM | POA: Insufficient documentation

## 2016-01-12 DIAGNOSIS — C5701 Malignant neoplasm of right fallopian tube: Secondary | ICD-10-CM

## 2016-01-12 DIAGNOSIS — R519 Headache, unspecified: Secondary | ICD-10-CM

## 2016-01-12 DIAGNOSIS — Z90710 Acquired absence of both cervix and uterus: Secondary | ICD-10-CM | POA: Insufficient documentation

## 2016-01-12 DIAGNOSIS — Z5189 Encounter for other specified aftercare: Secondary | ICD-10-CM | POA: Insufficient documentation

## 2016-01-12 DIAGNOSIS — Z79899 Other long term (current) drug therapy: Secondary | ICD-10-CM | POA: Insufficient documentation

## 2016-01-12 DIAGNOSIS — Z808 Family history of malignant neoplasm of other organs or systems: Secondary | ICD-10-CM | POA: Diagnosis not present

## 2016-01-12 DIAGNOSIS — C5700 Malignant neoplasm of unspecified fallopian tube: Secondary | ICD-10-CM | POA: Insufficient documentation

## 2016-01-12 DIAGNOSIS — Z9049 Acquired absence of other specified parts of digestive tract: Secondary | ICD-10-CM | POA: Insufficient documentation

## 2016-01-12 DIAGNOSIS — R51 Headache: Secondary | ICD-10-CM | POA: Diagnosis not present

## 2016-01-12 DIAGNOSIS — Z9221 Personal history of antineoplastic chemotherapy: Secondary | ICD-10-CM | POA: Insufficient documentation
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
## 2016-01-12 NOTE — Patient Instructions (Signed)
We will call you with your CT scan results

## 2016-01-13 ENCOUNTER — Telehealth: Payer: Self-pay | Admitting: *Deleted

## 2016-01-13 NOTE — Telephone Encounter (Signed)
Pt called left message states " My insurance runs out on Thursday and I've already met my deductible could the CT scan for Friday be rescheduled." Called central scheduling r/s scan for thursday at 230pm. Called pt unable to reach lmovm to call back to confirm message received

## 2016-01-14 ENCOUNTER — Ambulatory Visit: Payer: BLUE CROSS/BLUE SHIELD | Admitting: Gynecologic Oncology

## 2016-01-15 ENCOUNTER — Ambulatory Visit (HOSPITAL_COMMUNITY)
Admission: RE | Admit: 2016-01-15 | Discharge: 2016-01-15 | Disposition: A | Discharge status: Home / Self Care | Payer: BLUE CROSS/BLUE SHIELD | Source: Ambulatory Visit | Attending: Gynecologic Oncology | Admitting: Gynecologic Oncology

## 2016-01-15 ENCOUNTER — Encounter (HOSPITAL_COMMUNITY): Payer: Self-pay

## 2016-01-15 ENCOUNTER — Ambulatory Visit (HOSPITAL_COMMUNITY): Payer: BLUE CROSS/BLUE SHIELD

## 2016-01-15 DIAGNOSIS — R519 Headache, unspecified: Secondary | ICD-10-CM

## 2016-01-15 DIAGNOSIS — C5701 Malignant neoplasm of right fallopian tube: Secondary | ICD-10-CM | POA: Insufficient documentation

## 2016-01-15 DIAGNOSIS — R51 Headache: Secondary | ICD-10-CM

## 2016-01-15 MED ORDER — IOPAMIDOL (ISOVUE-300) INJECTION 61%
100.0000 mL | Freq: Once | INTRAVENOUS | Status: AC | PRN
  Administered 2016-01-15: 100 mL via INTRAVENOUS

## 2016-01-15 MED ORDER — IOPAMIDOL (ISOVUE-300) INJECTION 61%
INTRAVENOUS | Status: AC
  Filled 2016-01-15: qty 100

## 2016-01-15 MED ORDER — SODIUM CHLORIDE 0.9 % IJ SOLN
INTRAMUSCULAR | Status: AC
  Filled 2016-01-15: qty 50

## 2016-01-16 ENCOUNTER — Telehealth: Payer: Self-pay | Admitting: Gynecologic Oncology

## 2016-01-16 ENCOUNTER — Other Ambulatory Visit: Payer: Self-pay | Admitting: Gynecologic Oncology

## 2016-01-16 ENCOUNTER — Ambulatory Visit (HOSPITAL_COMMUNITY): Payer: BLUE CROSS/BLUE SHIELD

## 2016-01-16 ENCOUNTER — Ambulatory Visit: Payer: BLUE CROSS/BLUE SHIELD | Admitting: Neurology

## 2016-01-16 DIAGNOSIS — R971 Elevated cancer antigen 125 [CA 125]: Secondary | ICD-10-CM

## 2016-01-16 DIAGNOSIS — C569 Malignant neoplasm of unspecified ovary: Secondary | ICD-10-CM

## 2016-01-16 NOTE — Telephone Encounter (Signed)
Patient called requesting CT scan of the head, CAP results.  Discussed the results along with Dr. Serita Grit recommendations to proceed with a PET scan.  Advised patient I would contact her with a date and time for her PET.  All questions answered.

## 2016-01-16 NOTE — Telephone Encounter (Signed)
Informed patient of PET scan appt on Dec 11 at 1pm with NPO 6 hours before and arrival time of 12:30pm.  All questions answered.  Advised to call for any needs or concerns.

## 2016-01-20 ENCOUNTER — Telehealth: Payer: Self-pay | Admitting: Gynecologic Oncology

## 2016-01-20 NOTE — Telephone Encounter (Signed)
Returned call to patient.  She had left a message asking if she could take doxycycline eye drops and eye drops with flax seed oil for severe dry eyes prescribed by her physician.  Advised patient it would be fine for her to use the medications and to call for any questions or concerns.

## 2016-01-26 ENCOUNTER — Encounter (HOSPITAL_COMMUNITY)
Admission: RE | Admit: 2016-01-26 | Discharge: 2016-01-26 | Disposition: A | Discharge status: Home / Self Care | Payer: 59 | Source: Ambulatory Visit | Attending: Gynecologic Oncology | Admitting: Gynecologic Oncology

## 2016-01-26 ENCOUNTER — Telehealth: Payer: Self-pay | Admitting: Gynecologic Oncology

## 2016-01-26 DIAGNOSIS — C569 Malignant neoplasm of unspecified ovary: Secondary | ICD-10-CM

## 2016-01-26 DIAGNOSIS — R971 Elevated cancer antigen 125 [CA 125]: Secondary | ICD-10-CM | POA: Diagnosis present

## 2016-01-26 LAB — GLUCOSE, CAPILLARY: Glucose-Capillary: 94 mg/dL (ref 65–99)

## 2016-01-26 MED ORDER — FLUDEOXYGLUCOSE F - 18 (FDG) INJECTION
6.8000 | Freq: Once | INTRAVENOUS | Status: AC | PRN
  Administered 2016-01-26: 6.8 via INTRAVENOUS

## 2016-01-26 NOTE — Telephone Encounter (Signed)
Informed of normal PET results.   Plan is to repeat CA 125 the week after Christmas. We will look for trends and monitor accordingly  [name], MD

## 2016-01-27 ENCOUNTER — Telehealth: Payer: Self-pay | Admitting: Nurse Practitioner

## 2016-01-27 NOTE — Telephone Encounter (Signed)
Per Joylene John, NP, patient informed of lab apt on 02/11/16 at 10:15 for CA 125.

## 2016-01-30 ENCOUNTER — Telehealth: Payer: Self-pay

## 2016-01-30 NOTE — Telephone Encounter (Signed)
Notified pt to repeat CA 125 week of 02/09/16. Lab order is active.

## 2016-02-02 ENCOUNTER — Ambulatory Visit: Payer: BLUE CROSS/BLUE SHIELD | Admitting: Gynecologic Oncology

## 2016-02-11 ENCOUNTER — Other Ambulatory Visit: Payer: BLUE CROSS/BLUE SHIELD

## 2016-02-13 ENCOUNTER — Other Ambulatory Visit: Payer: 59

## 2016-02-13 DIAGNOSIS — C569 Malignant neoplasm of unspecified ovary: Secondary | ICD-10-CM

## 2016-02-14 LAB — CA 125: Cancer Antigen (CA) 125: 913.9 U/mL — ABNORMAL HIGH (ref 0.0–38.1)

## 2016-02-17 ENCOUNTER — Telehealth: Payer: Self-pay | Admitting: Gynecologic Oncology

## 2016-02-17 NOTE — Telephone Encounter (Addendum)
Patient informed of CA 125 results: 913.9.  She reports a "pinching" feeling in her abdomen when bending over which has been present for 2 months.  She also reports lower back pain, belly tightness with gas, "late stage chemo effects including achy bones", dull headache.  She states her bowels are "relatively normal" and she has been taking probiotics.  She has also been taking doxycycline for the past month due to severe dry eyes.  Advised patient that she would be contacted with Dr. Serita Grit recommendations once available about her increasing CA 125.

## 2016-02-20 ENCOUNTER — Telehealth: Payer: Self-pay | Admitting: Gynecologic Oncology

## 2016-02-20 NOTE — Telephone Encounter (Signed)
Told patient she has been scheduled for a diag laparoscopy on Tues, Jan 9 at 4:30pm.  Advised she would receive a phone call from the hospital about pre-operative testing.

## 2016-02-20 NOTE — Telephone Encounter (Signed)
Patient advised of Dr. Serita Grit recommendations:  If PET was <6 weeks ago, then I wouldn't reimage, but if it was, then lets try for repeat imaging - CT C/A/P first.  If it was less than 6 weeks ago for the PET, then lets see if we can get her on for a diagnostic laparoscopy with biopsy possibly on Tuesday.  Advised patient that we would contact her with additional details.  She is agreeable with the plan.  Advised to call for any needs in between that time.

## 2016-02-23 ENCOUNTER — Other Ambulatory Visit: Payer: Self-pay

## 2016-02-23 ENCOUNTER — Encounter (HOSPITAL_COMMUNITY): Payer: Self-pay | Admitting: *Deleted

## 2016-02-23 NOTE — Progress Notes (Signed)
ekg  5/17, chest ct 11/17 epic

## 2016-02-24 ENCOUNTER — Encounter (HOSPITAL_COMMUNITY): Payer: Self-pay | Admitting: *Deleted

## 2016-02-24 ENCOUNTER — Ambulatory Visit (HOSPITAL_COMMUNITY)
Admission: RE | Admit: 2016-02-24 | Discharge: 2016-02-24 | Disposition: A | Discharge status: Home / Self Care | Payer: 59 | Source: Ambulatory Visit | Attending: Gynecologic Oncology | Admitting: Gynecologic Oncology

## 2016-02-24 ENCOUNTER — Ambulatory Visit (HOSPITAL_COMMUNITY): Payer: 59 | Admitting: Certified Registered"

## 2016-02-24 ENCOUNTER — Encounter (HOSPITAL_COMMUNITY)
Admission: RE | Disposition: A | Discharge status: Home / Self Care | Payer: Self-pay | Source: Ambulatory Visit | Attending: Gynecologic Oncology

## 2016-02-24 DIAGNOSIS — Z9889 Other specified postprocedural states: Secondary | ICD-10-CM | POA: Insufficient documentation

## 2016-02-24 DIAGNOSIS — Z803 Family history of malignant neoplasm of breast: Secondary | ICD-10-CM | POA: Insufficient documentation

## 2016-02-24 DIAGNOSIS — R971 Elevated cancer antigen 125 [CA 125]: Secondary | ICD-10-CM

## 2016-02-24 DIAGNOSIS — Z8051 Family history of malignant neoplasm of kidney: Secondary | ICD-10-CM | POA: Insufficient documentation

## 2016-02-24 DIAGNOSIS — Z8543 Personal history of malignant neoplasm of ovary: Secondary | ICD-10-CM | POA: Diagnosis not present

## 2016-02-24 DIAGNOSIS — Z79899 Other long term (current) drug therapy: Secondary | ICD-10-CM | POA: Insufficient documentation

## 2016-02-24 DIAGNOSIS — Z8544 Personal history of malignant neoplasm of other female genital organs: Secondary | ICD-10-CM | POA: Diagnosis not present

## 2016-02-24 DIAGNOSIS — Z807 Family history of other malignant neoplasms of lymphoid, hematopoietic and related tissues: Secondary | ICD-10-CM | POA: Diagnosis not present

## 2016-02-24 DIAGNOSIS — C786 Secondary malignant neoplasm of retroperitoneum and peritoneum: Secondary | ICD-10-CM | POA: Insufficient documentation

## 2016-02-24 DIAGNOSIS — G629 Polyneuropathy, unspecified: Secondary | ICD-10-CM | POA: Insufficient documentation

## 2016-02-24 DIAGNOSIS — Z808 Family history of malignant neoplasm of other organs or systems: Secondary | ICD-10-CM | POA: Insufficient documentation

## 2016-02-24 DIAGNOSIS — Z823 Family history of stroke: Secondary | ICD-10-CM | POA: Insufficient documentation

## 2016-02-24 DIAGNOSIS — Z85828 Personal history of other malignant neoplasm of skin: Secondary | ICD-10-CM | POA: Insufficient documentation

## 2016-02-24 DIAGNOSIS — C762 Malignant neoplasm of abdomen: Secondary | ICD-10-CM | POA: Diagnosis present

## 2016-02-24 DIAGNOSIS — Z8249 Family history of ischemic heart disease and other diseases of the circulatory system: Secondary | ICD-10-CM | POA: Diagnosis not present

## 2016-02-24 DIAGNOSIS — J45909 Unspecified asthma, uncomplicated: Secondary | ICD-10-CM | POA: Insufficient documentation

## 2016-02-24 HISTORY — DX: Personal history of urinary calculi: Z87.442

## 2016-02-24 HISTORY — PX: LAPAROSCOPY: SHX197

## 2016-02-24 LAB — CBC
HCT: 36.9 % (ref 36.0–46.0)
Hemoglobin: 12.9 g/dL (ref 12.0–15.0)
MCH: 31.9 pg (ref 26.0–34.0)
MCHC: 35 g/dL (ref 30.0–36.0)
MCV: 91.3 fL (ref 78.0–100.0)
Platelets: 229 10*3/uL (ref 150–400)
RBC: 4.04 MIL/uL (ref 3.87–5.11)
RDW: 12.6 % (ref 11.5–15.5)
WBC: 3.6 10*3/uL — ABNORMAL LOW (ref 4.0–10.5)

## 2016-02-24 LAB — TYPE AND SCREEN
ABO/RH(D): A POS
Antibody Screen: NEGATIVE

## 2016-02-24 SURGERY — LAPAROSCOPY, DIAGNOSTIC
Anesthesia: General | Site: Pelvis

## 2016-02-24 MED ORDER — DEXAMETHASONE SODIUM PHOSPHATE 10 MG/ML IJ SOLN
INTRAMUSCULAR | Status: DC | PRN
  Administered 2016-02-24: 10 mg via INTRAVENOUS

## 2016-02-24 MED ORDER — MIDAZOLAM HCL 2 MG/2ML IJ SOLN
INTRAMUSCULAR | Status: AC
  Filled 2016-02-24: qty 2

## 2016-02-24 MED ORDER — PROPOFOL 10 MG/ML IV BOLUS
INTRAVENOUS | Status: DC | PRN
  Administered 2016-02-24: 150 mg via INTRAVENOUS

## 2016-02-24 MED ORDER — FENTANYL CITRATE (PF) 250 MCG/5ML IJ SOLN
INTRAMUSCULAR | Status: DC | PRN
  Administered 2016-02-24: 100 ug via INTRAVENOUS
  Administered 2016-02-24: 50 ug via INTRAVENOUS
  Administered 2016-02-24: 100 ug via INTRAVENOUS

## 2016-02-24 MED ORDER — PROPOFOL 10 MG/ML IV BOLUS
INTRAVENOUS | Status: AC
Start: 2016-02-24 — End: 2016-02-24
  Filled 2016-02-24: qty 20

## 2016-02-24 MED ORDER — OXYCODONE HCL 5 MG/5ML PO SOLN
5.0000 mg | Freq: Once | ORAL | Status: DC | PRN
  Filled 2016-02-24: qty 5

## 2016-02-24 MED ORDER — SODIUM CHLORIDE 0.9 % IR SOLN
Status: DC | PRN
  Administered 2016-02-24: 1000 mL

## 2016-02-24 MED ORDER — SCOPOLAMINE 1 MG/3DAYS TD PT72
MEDICATED_PATCH | TRANSDERMAL | Status: AC
  Filled 2016-02-24: qty 1

## 2016-02-24 MED ORDER — OXYCODONE HCL 5 MG PO TABS: 5.0000 mg | ORAL_TABLET | Freq: Once | ORAL | 0 refills | Status: DC | PRN

## 2016-02-24 MED ORDER — FENTANYL CITRATE (PF) 250 MCG/5ML IJ SOLN
INTRAMUSCULAR | Status: AC
  Filled 2016-02-24: qty 5

## 2016-02-24 MED ORDER — LACTATED RINGERS IV SOLN
INTRAVENOUS | Status: DC
  Administered 2016-02-24: 16:00:00 via INTRAVENOUS
  Administered 2016-02-24: 1000 mL via INTRAVENOUS

## 2016-02-24 MED ORDER — ONDANSETRON HCL 4 MG/2ML IJ SOLN
INTRAMUSCULAR | Status: DC | PRN
Start: 2016-02-24 — End: 2016-02-24
  Administered 2016-02-24: 4 mg via INTRAVENOUS

## 2016-02-24 MED ORDER — SCOPOLAMINE 1 MG/3DAYS TD PT72
1.0000 | MEDICATED_PATCH | Freq: Once | TRANSDERMAL | Status: DC
  Administered 2016-02-24: 1.5 mg via TRANSDERMAL

## 2016-02-24 MED ORDER — ONDANSETRON HCL 4 MG/2ML IJ SOLN: 4.0000 mg | Freq: Four times a day (QID) | INTRAMUSCULAR | Status: DC | PRN

## 2016-02-24 MED ORDER — OXYCODONE HCL 5 MG PO TABS: 5.0000 mg | ORAL_TABLET | Freq: Once | ORAL | Status: DC | PRN

## 2016-02-24 MED ORDER — SUCCINYLCHOLINE CHLORIDE 200 MG/10ML IV SOSY
PREFILLED_SYRINGE | INTRAVENOUS | Status: DC | PRN
  Administered 2016-02-24: 80 mg via INTRAVENOUS

## 2016-02-24 MED ORDER — MIDAZOLAM HCL 2 MG/2ML IJ SOLN
INTRAMUSCULAR | Status: DC | PRN
  Administered 2016-02-24: 2 mg via INTRAVENOUS

## 2016-02-24 MED ORDER — HYDROMORPHONE HCL 1 MG/ML IJ SOLN: 0.2500 mg | INTRAMUSCULAR | Status: DC | PRN

## 2016-02-24 MED ORDER — LIDOCAINE 2% (20 MG/ML) 5 ML SYRINGE
INTRAMUSCULAR | Status: DC | PRN
  Administered 2016-02-24: 40 mg via INTRAVENOUS

## 2016-02-24 MED ORDER — SENNA 8.6 MG PO TABS: 1.0000 | ORAL_TABLET | Freq: Every day | ORAL | 0 refills | Status: DC

## 2016-02-24 SURGICAL SUPPLY — 47 items
ADH SKN CLS APL DERMABOND .7 (GAUZE/BANDAGES/DRESSINGS) ×1
BAG SPEC RTRVL LRG 6X4 10 (ENDOMECHANICALS) ×1
CABLE HIGH FREQUENCY MONO STRZ (ELECTRODE) ×2 IMPLANT
CHLORAPREP W/TINT 26ML (MISCELLANEOUS) ×2 IMPLANT
CONT SPEC 4OZ CLIKSEAL STRL BL (MISCELLANEOUS) IMPLANT
DERMABOND ADVANCED (GAUZE/BANDAGES/DRESSINGS) ×1
DERMABOND ADVANCED .7 DNX12 (GAUZE/BANDAGES/DRESSINGS) ×1 IMPLANT
DRSG TELFA 3X8 NADH (GAUZE/BANDAGES/DRESSINGS) ×2 IMPLANT
ELECT REM PT RETURN 9FT ADLT (ELECTROSURGICAL) ×2
ELECTRODE REM PT RTRN 9FT ADLT (ELECTROSURGICAL) ×1 IMPLANT
GLOVE BIO SURGEON STRL SZ 6 (GLOVE) ×4 IMPLANT
GLOVE BIO SURGEON STRL SZ 6.5 (GLOVE) ×1 IMPLANT
GLOVE BIO SURGEON STRL SZ7.5 (GLOVE) ×1 IMPLANT
GLOVE BIOGEL PI IND STRL 7.5 (GLOVE) IMPLANT
GLOVE BIOGEL PI INDICATOR 7.5 (GLOVE) ×2
GOWN STRL NON-REIN LRG LVL3 (GOWN DISPOSABLE) ×5 IMPLANT
GOWN STRL REUS W/ TWL LRG LVL3 (GOWN DISPOSABLE) ×2 IMPLANT
GOWN STRL REUS W/TWL LRG LVL3 (GOWN DISPOSABLE)
HOLDER FOLEY CATH W/STRAP (MISCELLANEOUS) IMPLANT
IRRIG SUCT STRYKERFLOW 2 WTIP (MISCELLANEOUS) ×4
IRRIGATION SUCT STRKRFLW 2 WTP (MISCELLANEOUS) IMPLANT
IV NS 1000ML (IV SOLUTION) ×2
IV NS 1000ML BAXH (IV SOLUTION) IMPLANT
KIT BASIN OR (CUSTOM PROCEDURE TRAY) ×2 IMPLANT
MANIPULATOR UTERINE 4.5 ZUMI (MISCELLANEOUS) IMPLANT
NS IRRIG 1000ML POUR BTL (IV SOLUTION) ×1 IMPLANT
PAD DRESSING TELFA 3X8 NADH (GAUZE/BANDAGES/DRESSINGS) IMPLANT
PAD POSITIONING PINK XL (MISCELLANEOUS) ×1 IMPLANT
POSITIONER SURGICAL ARM (MISCELLANEOUS) IMPLANT
POUCH SPECIMEN RETRIEVAL 10MM (ENDOMECHANICALS) ×1 IMPLANT
SCISSORS LAP 5X35 DISP (ENDOMECHANICALS) ×1 IMPLANT
SHEET LAVH (DRAPES) ×2 IMPLANT
SLEEVE XCEL OPT CAN 5 100 (ENDOMECHANICALS) ×2 IMPLANT
SUT MNCRL AB 4-0 PS2 18 (SUTURE) ×2 IMPLANT
SUT VIC AB 3-0 PS2 18 (SUTURE)
SUT VIC AB 3-0 PS2 18XBRD (SUTURE) IMPLANT
TAPE CLOTH 4X10 WHT NS (GAUZE/BANDAGES/DRESSINGS) IMPLANT
TOWEL OR 17X26 10 PK STRL BLUE (TOWEL DISPOSABLE) ×2 IMPLANT
TOWEL OR NON WOVEN STRL DISP B (DISPOSABLE) ×2 IMPLANT
TRAP SPECIMEN MUCOUS 40CC (MISCELLANEOUS) ×1 IMPLANT
TRAY FOLEY W/METER SILVER 16FR (SET/KITS/TRAYS/PACK) ×2 IMPLANT
TRAY LAPAROSCOPIC (CUSTOM PROCEDURE TRAY) ×2 IMPLANT
TROCAR XCEL 12X100 BLDLESS (ENDOMECHANICALS) ×1 IMPLANT
TROCAR XCEL BLUNT TIP 100MML (ENDOMECHANICALS) IMPLANT
TROCAR XCEL NON-BLD 5MMX100MML (ENDOMECHANICALS) ×1 IMPLANT
TUBING INSUF HEATED (TUBING) ×1 IMPLANT
TUBING NON-CON 1/4 X 20 CONN (TUBING) IMPLANT

## 2016-02-24 NOTE — Transfer of Care (Signed)
Immediate Anesthesia Transfer of Care Note  Patient: Kathryn Ramsey  Procedure(s) Performed: Procedure(s): LAPAROSCOPY DIAGNOSTIC WITH peritoneal washings and peritoneal biopsiesBIOPSY (N/A)  Patient Location: PACU  Anesthesia Type:General  Level of Consciousness: awake and alert   Airway & Oxygen Therapy: Patient Spontanous Breathing and Patient connected to face mask oxygen  Post-op Assessment: Report given to RN and Post -op Vital signs reviewed and stable  Post vital signs: Reviewed and stable  Last Vitals:  Vitals:   02/24/16 1319  BP: 114/72  Pulse: 74  Resp: 16  Temp: 37.1 C    Last Pain:  Vitals:   02/24/16 1319  TempSrc: Oral      Patients Stated Pain Goal: 4 ([id number])  Complications: No apparent anesthesia complications

## 2016-02-24 NOTE — Anesthesia Preprocedure Evaluation (Signed)
Anesthesia Evaluation  Patient identified by MRN, date of birth, ID band Patient awake    Reviewed: Allergy & Precautions, H&P , NPO status , Patient's Chart, lab work & pertinent test results  History of Anesthesia Complications (+) PONV  Airway Mallampati: II  TM Distance: >3 FB Neck ROM: full    Dental no notable dental hx. (+) Dental Advisory Given, Teeth Intact   Pulmonary neg pulmonary ROS, asthma ,  URTI induced asthma   Pulmonary exam normal breath sounds clear to auscultation       Cardiovascular Exercise Tolerance: Good negative cardio ROS Normal cardiovascular exam Rhythm:regular Rate:Normal     Neuro/Psych negative neurological ROS  negative psych ROS   GI/Hepatic negative GI ROS, Neg liver ROS,   Endo/Other  negative endocrine ROS  Renal/GU negative Renal ROS  negative genitourinary   Musculoskeletal   Abdominal   Peds  Hematology negative hematology ROS (+)   Anesthesia Other Findings   Reproductive/Obstetrics                             Lab Results  Component Value Date   WBC 3.6 (L) 02/24/2016   HGB 12.9 02/24/2016   HCT 36.9 02/24/2016   MCV 91.3 02/24/2016   PLT 229 02/24/2016   Lab Results  Component Value Date   CREATININE 0.8 01/07/2016   BUN 16.4 01/07/2016   NA 137 01/07/2016   K 4.2 01/07/2016   CL 104 06/18/2015   CO2 23 01/07/2016    Anesthesia Physical  Anesthesia Plan  ASA: II  Anesthesia Plan: General   Post-op Pain Management:    Induction: Intravenous  Airway Management Planned: Oral ETT  Additional Equipment:   Intra-op Plan:   Post-operative Plan: Extubation in OR  Informed Consent: I have reviewed the patients History and Physical, chart, labs and discussed the procedure including the risks, benefits and alternatives for the proposed anesthesia with the patient or authorized representative who has indicated his/her  understanding and acceptance.   Dental Advisory Given  Plan Discussed with: CRNA  Anesthesia Plan Comments:         Anesthesia Quick Evaluation

## 2016-02-24 NOTE — Anesthesia Postprocedure Evaluation (Addendum)
Anesthesia Post Note  Patient: Kathryn Ramsey  Procedure(s) Performed: Procedure(s) (LRB): LAPAROSCOPY DIAGNOSTIC WITH peritoneal washings and peritoneal biopsiesBIOPSY (N/A)  Patient location during evaluation: PACU Anesthesia Type: General Level of consciousness: awake, awake and alert and oriented Pain management: pain level controlled Vital Signs Assessment: post-procedure vital signs reviewed and stable Respiratory status: spontaneous breathing, nonlabored ventilation and respiratory function stable Cardiovascular status: blood pressure returned to baseline Anesthetic complications: no       Last Vitals:  Vitals:   02/24/16 1707 02/24/16 1715  BP: 120/65 131/71  Pulse: 74 70  Resp: 14 11  Temp: 36.6 C     Last Pain:  Vitals:   02/24/16 1707  TempSrc:   PainSc: 2                  Datron Brakebill COKER

## 2016-02-24 NOTE — H&P (Signed)
Assessment:    51 y.o. year old with stage IIIC right fallopian tube cancer (primary therapy completed June, 2017), likely recurrence based on CA 125 elevation of >900.    S/p optimal cytoreductive surgery (BSO, omentectomy, ablation of tumor implants) on 03/27/15.  No radiographic evidence of disease on PET and CT scan. BRCA negative.   Plan: 1/ Diagnostic laparoscopy with directed biopsies 2/ If recurrence present, would recommend either carbo + doxil followed by PARP inhibitor maintenance.  HPI:  Kathryn Ramsey is a very pleasant 51 year old G4P4 who was originally seen in consultation on 03/17/15 at the request of Dr Collene Mares and Dr Garwin Brothers for peritoneal carcinomatosis. The patient has a history of a workup by urology for gross hematuria which included a pelvic examine was suggested for extrinsic mass effect on the bladder on cystoscopy. Cystoscopy took place on in December 2016. The patient denies abdominal pain bloating early satiety or abdominal distention.  On 08/03/2015 at [facility] she underwent a CT scan of the abdomen and pelvis as ordered by Dr Baruch Gouty. This revealed a 1.4 cm low attenuation lesion on the posterior right hepatic lobe suggestive of a hemangioma. Status post hysterectomy. No ovarian masses. Moderate ascites. Omental caking seen in the lateral left abdomen and pelvis. Peritoneal nodularity in the left lateral pelvic cul-de-sac. No gross extrinsic compression on the bladder was identified on imaging.  The patient was then seen by her gastroenterologist, Dr. Collene Mares, who performed a colonoscopy which was unremarkable.  Tumor markers were drawn on 08/04/2015 and these included a CA-125 that was elevated to 957, and a CEA that was normal at 1.  The patient is otherwise a very healthy woman. She is an Futures trader. She has had a history of 4 spontaneous vaginal deliveries. She has a remote history of endometriosis. She is limited history of oral contraceptive pill  usage (possibly 2 months). She a history of primary infertility and was treated with Clomid for her first pregnancy.  Her prior endometriosis was identified and treated with 3 laparoscopies. Her only other abdominal surgery was a laparoscopic cholecystectomy, and an LAVH on March 15th 2010 with removal of a right paratubal cyst for a fibroid uterus and menorrhagia. This was performed by Dr. Servando Salina.   She has no remarkable history for malignancy. Her maternal aunt had non-hodgkins lymphoma.  On 03/27/15 she underwent diagnostic laparoscopy, exploratory laparotomy, BSO, omentectomy, argon beam ablation of tumor implants for an optimal cytoreduction (R0) for high grade serous fallopian tube cancer. Final pathology confirmed that her primary tumor was the right fallopian tube.  She had an uncomplicated postoperative stay in hospital and was discharged on POD 2.  She was evaluated in the office on POD 7 for increased abdominal discomfort and feeling a protrusion in her upper abdomen.CT scan confirmed no hernia.  She went on to receive 6 cycles of carboplatin and paclitaxel adjuvant therapy between 04/14/15 and 08/11/15. She developed severe neuropathy symptoms which necessitated treatment delay of cycle 5 and weekly scheduling of taxol on cycle 6.  CA 125 normalized quickly during primary treatment:  1226 on 03/20/15 165 on 04/28/15 44 on 05/22/15 26.1 on 06/16/15 18.7 on 08/11/15 17 on 7/17/7 15 on 09/29/15.  Post-treatment imaging with CT chest,abdo, pelvis on 09/29/15 showed complete resolution of ascites and peritoneal implants. There was a stable 1.5cm liver hemangioma present.   Interval Hx: On 01/06/16 she felt unwell with a headache and requested labs. On 01/07/16 CA 125 was noted to be  elevated at 69.1.  On 01/16/16 she had a CT scan which showed no measurable recurrence. On 01/26/16 a PET/CT showed no measurable recurrence.  Repeat CA 125 on 02/13/16 was substantially  elevated at 914.  She reports symptoms of urethral tightness with urination and feels a "twinge" in her left side when she flexes forwards. She also reports a dull persistent headache worse with bending forwards, central anterior skull, no neurologic symptoms for 2 months.       Past Medical History:  Diagnosis Date  . Acute sensory neuropathy ([place]) 06/26/2015  . Allergy   . Anemia   . Asthma    illness induced asthma  . Blood transfusion without reported diagnosis    at age 19 years old D/T surgery to femur being crushed  . 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 Heart murmur    pt, states had a "working heart murmur"  . PONV (postoperative nausea and vomiting)   . Skin cancer         Past Surgical History:  Procedure Laterality Date  . 3 laporscopic proceedures    . ABDOMINAL HYSTERECTOMY     2010  . CHOLECYSTECTOMY     2010  . DILATION AND CURETTAGE OF UTERUS     1997  . LAPAROSCOPY N/A 03/27/2015   Procedure: DIAGNOSTIC LAPAROSCOPY ;  Surgeon: [name], MD;  Location: [facility];  Service: Gynecology;  Laterality: N/A;  . LAPAROTOMY N/A 03/27/2015   Procedure: EXPLORATORY LAPAROTOMY, BILATERAL SALPINGO OOPHORECTOMY, OMENTECTOMY, RADICAL TUMOR DEBULKING;  Surgeon: [name], MD;  Location: [facility];  Service: Gynecology;  Laterality: N/A;  . sinus surgery 1994           Family History  Problem Relation Age of Onset  . Hypertension Father   . Skin cancer Father     nonmelanoma skin cancers in his late 20s  . Non-Hodgkin's lymphoma Maternal Aunt     dx. 74s; smoker  . Stroke Maternal Grandmother   . Other Mother     benign meningioma dx. early-mid-70s; hysterectomy in her late 48s for heavy periods - still has ovaries  . Other Son     one son with pre-cancerous skin findings  . Other Daughter     cysts on ovaries and hx of heavy periods  . Other Sister      hysterectomy for cysts - still has ovaries  . Heart attack Maternal Grandfather   . Heart attack Paternal Grandfather   . Renal cancer Maternal Aunt 60    smoker  . Breast cancer Maternal Aunt 78  . Other Maternal Aunt     dx. benign brain tumor (meningioma) at 40; 2nd benign brain tumor in her late 97s; hx of radical hysterectomy at age 36  . Brain cancer Other     NOS tumor   Social History        Social History  . Marital status: Married    Spouse name: N/A  . Number of children: 4  . 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 Smoking status: Never Smoker  . Smokeless tobacco: Never Used  . Alcohol use Yes     Comment: socially  . Drug use: No  . Sexual activity: Not on file  Other Topics Concern  . Not on file      Social History Narrative   Lives at home w/ husband and children   Right-handed   3 cups caffeine per day        Allergies  Allergen Reactions  . Bee Venom Shortness Of Breath    swelling  . Sulfa Antibiotics Shortness Of Breath    Vomit , diarrhea , hives         Current Outpatient Prescriptions on File Prior to Visit  Medication Sig Dispense Refill  . calcium citrate-vitamin D (CITRACAL+D) 315-200 MG-UNIT tablet Take 1 tablet by mouth 2 (two) times daily.    . Cholecalciferol LIQD Take 1 spray by mouth daily.    . clidinium-chlordiazePOXIDE (LIBRAX) 5-2.5 MG capsule Take 1 capsule by mouth at bedtime as needed. Reported on 08/11/2015  0  . gabapentin (NEURONTIN) 100 MG capsule Take 1 capsule (100 mg total) by mouth 3 (three) times daily. 90 capsule 2  . ibuprofen (ADVIL,MOTRIN) 200 MG tablet Take 400 mg by mouth every 8 (eight) hours as needed for mild pain. Reported on 08/18/2015    . loratadine (CLARITIN) 10 MG tablet Take 10 mg by mouth daily. Begin day of chemotherapy    . LORazepam (ATIVAN) 0.5 MG tablet Take 1 tablet (0.5 mg  total) by mouth every 8 (eight) hours as needed for anxiety or sleep. Reported on 04/01/2015 30 tablet 0  . TURMERIC PO Take 1 tablet by mouth daily.    [name] venlafaxine XR (EFFEXOR XR) 37.5 MG 24 hr capsule One tablet daily for 2 weeks, then 2 tablets daily 60 capsule 2  . vitamin B-12 (CYANOCOBALAMIN) 500 MCG tablet Take 500 mcg by mouth daily.     No current facility-administered medications on file prior to visit.     Review of systems: Constitutional:  She has no weight gain or weight loss. She has no fever or chills. Eyes: No blurred vision Ears, Nose, Mouth, Throat: No dizziness, headaches or changes in hearing. No mouth sores. Cardiovascular: No chest pain, palpitations or edema. Respiratory:  No shortness of breath, wheezing or cough Gastrointestinal: She has normal bowel movements without diarrhea or constipation. She denies any nausea or vomiting. She denies blood in her stool or heart burn. Genitourinary:  She denies pelvic pain, pelvic pressure or changes in her urinary function. She has no hematuria, dysuria, or incontinence. She has no irregular vaginal bleeding or vaginal discharge + vaginal dryness Musculoskeletal: Denies muscle weakness or joint pains.  Skin:  She has no skin changes, rashes or itching Neurological:  + peripheral neuropathy Psychiatric:  She denies depression or anxiety. Hematologic/Lymphatic:   No easy bruising or bleeding   Physical Exam: There were no vitals taken for this visit. General: Well dressed, well nourished in no apparent distress.   HEENT:  Normocephalic and atraumatic, no lesions.  Extraocular muscles intact. Sclerae anicteric. Pupils equal, round, reactive. No mouth sores or ulcers. Thyroid is normal size, not nodular, midline. Skin:  No lesions or rashes. Lungs:  Clear to auscultation bilaterally.  No wheezes. Cardiovascular:  Regular rate and rhythm.  No murmurs or rubs. Abdomen:  Soft, nontender, nondistended.  No palpable  masses.  No hepatosplenomegaly.  No ascites. Normal bowel sounds.  No hernias.  Incision is well healed. Protrusion in upper abdomen consistent with fascial gathering from reapproximation of tissue. Genitourinary: No pelvic masses, normal external genitalia, normal vagina (atrophic), well healed cuff, surgically absent cervix and uterus. Extremities: No cyanosis, clubbing  or edema.  No calf tenderness or erythema. No palpable cords. Psychiatric: Mood and affect are appropriate. Neurological: Awake, alert and oriented x 3. Sensation is intact, no neuropathy.  Musculoskeletal: No pain, normal strength and range of motion.   [name], MD

## 2016-02-24 NOTE — Op Note (Signed)
Surgeon: [name]   Assistants: none  Anesthesia: General endotracheal anesthesia  ASA Class: 3   Pre-operative Diagnosis: history of ovarian cancer, elevated CA 125 (tumor marker), negative imaging  Post-operative Diagnosis: same  Operation: Diagnostic laparoscopy and biopsies  Surgeon: [name]  Assistant Surgeon: none  Anesthesia: GET  Urine Output: 200cc  Operative Findings:  : diffuse milliary studding of 38mm desposits of tumor throughout peritoneal cavity including anterior abdominal wall, side walls, anterior and posterior pelvic cul de sac, diaphragms, residual omentum and throughout small and large intestinal serosa. Small volume (<100cc) ascites.    Procedure Details  The patient was seen in the Holding Room. The risks, benefits, complications, treatment options, and expected outcomes were discussed with the patient.  The patient concurred with the proposed plan, giving informed consent.  The site of surgery properly noted/marked. The patient was identified as Kathryn Ramsey and the procedure verified as a diagnostic laparoscopy with biospies. A Time Out was held and the above information confirmed.  After induction of anesthesia, the patient was draped and prepped in the usual sterile manner. Pt was placed in supine position after anesthesia and draped and prepped in the usual sterile manner. The abdominal drape was placed after the CholoraPrep had been allowed to dry for 3 minutes.  Her arms were tucked to her side with all appropriate precautions.  The chest was secured to the table.  The patient was placed in the semi-lithotomy position in [place].  The perineum was prepped with Betadine.  Foley catheter was placed.  A second time-out was performed.  OG tube placement was confirmed and to suction.     Procedure: A 5 mm skin incision was made in the left upper quadrant palmer's point.  The 5 mm Optiview port and scope was used for direct  entry.  Opening pressure was under 10 mm CO2.  The abdomen was insufflated and the findings were noted as above.   At this point and all points during the procedure, the patient's intra-abdominal pressure did not exceed 15 mmHg. The patient was placed in steep trendelenberg to visualize pelvic anatomy. Next, a 5 mm skin incision was made in the right mid abdomen and a 5 mm disposable port was placed through this intraperitoneally under direct visualization.  A 5 mm incision was made 10 cm inferior to the right mid abdominal port and a 24mm port was placed through this under direct visualization.   Washings were obtained.  Using laparoscopic grasper and shears biopsies of the peritoneal cavity to place of the peritoneal nodules. Hemostasis was observed.    At this point in the procedure was completed.  Ports were removed under direct visualization. All entry sites were hemostatic. CO2 insufflation was removed from the patient's abdomen.  The 10 mm port was closed with Vicryl on a UR-5 needle at the fascia.  The skin was closed with 4-0 Vicryl in a subcuticular manner.  Dermabond was applied to the incisions.  Sponge, lap and needle counts correct x 2.  The manipulator was retrieved from the uterus.The patient was taken to the recovery room in stable condition.  The vagina was swabbed with  minimal bleeding noted.   All instrument and needle counts were correct x  3.   The patient was transferred to the recovery room in a stable condition.  Estimated Blood Loss:  Minimal      Total IV Fluids: 700 ml         Specimens: washings,  peritoneal nodules         Complications:  None; patient tolerated the procedure well.         Disposition: PACU - hemodynamically stable.

## 2016-02-24 NOTE — Discharge Instructions (Signed)
General Anesthesia, Adult, Care After These instructions provide you with information about caring for yourself after your procedure. Your health care provider may also give you more specific instructions. Your treatment has been planned according to current medical practices, but problems sometimes occur. Call your health care provider if you have any problems or questions after your procedure. What can I expect after the procedure? After the procedure, it is common to have:  Vomiting.  A sore throat.  Mental slowness. It is common to feel:  Nauseous.  Cold or shivery.  Sleepy.  Tired.  Sore or achy, even in parts of your body where you did not have surgery. Follow these instructions at home: For at least 24 hours after the procedure:  Do not:  Participate in activities where you could fall or become injured.  Drive.  Use heavy machinery.  Drink alcohol.  Take sleeping pills or medicines that cause drowsiness.  Make important decisions or sign legal documents.  Take care of children on your own.  Rest. Eating and drinking  If you vomit, drink water, juice, or soup when you can drink without vomiting.  Drink enough fluid to keep your urine clear or pale yellow.  Make sure you have little or no nausea before eating solid foods.  Follow the diet recommended by your health care provider. General instructions  Have a responsible adult stay with you until you are awake and alert.  Return to your normal activities as told by your health care provider. Ask your health care provider what activities are safe for you.  Take over-the-counter and prescription medicines only as told by your health care provider.  If you smoke, do not smoke without supervision.  Keep all follow-up visits as told by your health care provider. This is important. Contact a health care provider if:  You continue to have nausea or vomiting at home, and medicines are not helpful.  You  cannot drink fluids or start eating again.  You cannot urinate after 8-12 hours.  You develop a skin rash.  You have fever.  You have increasing redness at the site of your procedure. Get help right away if:  You have difficulty breathing.  You have chest pain.  You have unexpected bleeding.  You feel that you are having a life-threatening or urgent problem. This information is not intended to replace advice given to you by your health care provider. Make sure you discuss any questions you have with your health care provider. Document Released: 05/10/2000 Document Revised: 07/07/2015 Document Reviewed: 01/16/2015 Elsevier Interactive Patient Education  2017 [employer].   Return to work: 1- 2 weeks (pending pain and energy levels)  Activity: 1. Be up and out of the bed during the day.  Take a nap if needed.  You may walk up steps but be careful and use the hand rail.  Stair climbing will tire you more than you think, you may need to stop part way and rest.   2. No lifting or straining for 6 weeks.  3. No driving for 5 days.  Do Not drive if you are taking narcotic pain medicine.  4. Shower daily.  Use soap and water on your incision and pat dry; don't rub.   5. No sexual activity and nothing in the vagina for 24 hours.  Diet: 1. Low sodium Heart Healthy Diet is recommended.  2. It is safe to use a laxative if you have difficulty moving your bowels.   Wound Care: 1. Keep  clean and dry.  Shower daily.  Reasons to call the Doctor:   Fever - Oral temperature greater than 100.4 degrees Fahrenheit  Foul-smelling vaginal discharge  Difficulty urinating  Nausea and vomiting  Increased pain at the site of the incision that is unrelieved with pain medicine.  Difficulty breathing with or without chest pain  New calf pain especially if only on one side  Sudden, continuing increased vaginal bleeding with or without clots.   Follow-up: 1. See [name] in 2  weeks.  Contacts: For questions or concerns you should contact:  Dr. [name] at [phone]  or at [place]

## 2016-02-24 NOTE — Anesthesia Procedure Notes (Signed)
Procedure Name: Intubation Date/Time: 02/24/2016 4:09 PM Performed by: Cynda Familia Pre-anesthesia Checklist: Patient identified, Emergency Drugs available, Suction available and Patient being monitored Patient Re-evaluated:Patient Re-evaluated prior to inductionOxygen Delivery Method: Circle System Utilized Preoxygenation: Pre-oxygenation with 100% oxygen Intubation Type: IV induction Ventilation: Mask ventilation without difficulty Tube type: Oral Number of attempts: 1 Airway Equipment and Method: Stylet Placement Confirmation: ETT inserted through vocal cords under direct vision,  positive ETCO2 and breath sounds checked- equal and bilateral Secured at: 22 cm Tube secured with: Tape Dental Injury: Teeth and Oropharynx as per pre-operative assessment  Comments: Smooth RSI-- intubation AM CRNA atraumatic-- teeth with irregular surfaces-- teeth and mouth as preop post laryngoscopy-- bilat BS

## 2016-02-25 ENCOUNTER — Encounter (HOSPITAL_COMMUNITY): Payer: Self-pay | Admitting: Gynecologic Oncology

## 2016-02-26 ENCOUNTER — Other Ambulatory Visit: Payer: Self-pay | Admitting: Gynecologic Oncology

## 2016-02-26 DIAGNOSIS — Z1231 Encounter for screening mammogram for malignant neoplasm of breast: Secondary | ICD-10-CM

## 2016-02-27 ENCOUNTER — Ambulatory Visit (HOSPITAL_BASED_OUTPATIENT_CLINIC_OR_DEPARTMENT_OTHER): Payer: 59 | Admitting: Oncology

## 2016-02-27 ENCOUNTER — Telehealth: Payer: Self-pay | Admitting: Oncology

## 2016-02-27 VITALS — BP 122/73 | HR 70 | Temp 98.2°F | Resp 20 | Ht 63.0 in | Wt 119.9 lb

## 2016-02-27 DIAGNOSIS — C801 Malignant (primary) neoplasm, unspecified: Secondary | ICD-10-CM

## 2016-02-27 DIAGNOSIS — C786 Secondary malignant neoplasm of retroperitoneum and peritoneum: Secondary | ICD-10-CM | POA: Diagnosis not present

## 2016-02-27 DIAGNOSIS — C5701 Malignant neoplasm of right fallopian tube: Secondary | ICD-10-CM

## 2016-02-27 DIAGNOSIS — C762 Malignant neoplasm of abdomen: Secondary | ICD-10-CM | POA: Diagnosis not present

## 2016-02-27 DIAGNOSIS — Z7189 Other specified counseling: Secondary | ICD-10-CM

## 2016-02-27 MED ORDER — DEXAMETHASONE 4 MG PO TABS: 8.0000 mg | ORAL_TABLET | Freq: Every day | ORAL | 1 refills | Status: DC

## 2016-02-27 MED ORDER — ONDANSETRON HCL 8 MG PO TABS: 8.0000 mg | ORAL_TABLET | Freq: Two times a day (BID) | ORAL | 1 refills | Status: DC | PRN

## 2016-02-27 MED ORDER — PROCHLORPERAZINE MALEATE 10 MG PO TABS: 10.0000 mg | ORAL_TABLET | Freq: Four times a day (QID) | ORAL | 1 refills | Status: DC | PRN

## 2016-02-27 MED ORDER — LORAZEPAM 0.5 MG PO TABS
0.5000 mg | ORAL_TABLET | Freq: Four times a day (QID) | ORAL | 0 refills | Status: DC | PRN
Start: 2016-02-27 — End: 2016-04-30

## 2016-02-27 MED ORDER — LORAZEPAM 0.5 MG PO TABS: 0.5000 mg | ORAL_TABLET | Freq: Four times a day (QID) | ORAL | 0 refills | Status: DC | PRN

## 2016-02-27 NOTE — Progress Notes (Signed)
[place]  Telephone:([phone]) [phone] [phone]     ID: Kathryn Ramsey DOB: [birth year]  MR#: [id number]  HLK#:[id number]  Patient Care Team: Harlan Stains, MD as PCP - General (Family Medicine) Gordy Levan, MD as Consulting Physician (Oncology) [name], MD as Consulting Physician (Obstetrics and Gynecology) Juanita Craver, MD as Consulting Physician (Gastroenterology) Servando Salina, MD as Consulting Physician (Obstetrics and Gynecology) Nickie Retort, MD as Consulting Physician (Urology) Chauncey Cruel, MD OTHER MD:  CHIEF COMPLAINT:   CURRENT TREATMENT:    HISTORY OF PRESENT ILLNESS: From Dr. Serita Grit original intake note 03/17/2015:  "Kathryn Ramsey is a very pleasant G4P4 who is seen in consultation at the request of Dr Collene Mares and Dr Garwin Brothers for peritoneal carcinomatosis. The patient has a history of a workup by urology for gross hematuria which included a pelvic examine was suggested for extrinsic mass effect on the bladder on cystoscopy. Cystoscopy took place on in December 2016. The patient denies abdominal pain bloating early satiety or abdominal distention.  On 08/03/2015 at [facility] she underwent a CT scan of the abdomen and pelvis as ordered by Dr Baruch Gouty. This revealed a 1.4 cm low attenuation lesion on the posterior right hepatic lobe suggestive of a hemangioma. Status post hysterectomy. No ovarian masses. Moderate ascites. Omental caking seen in the lateral left abdomen and pelvis. Peritoneal nodularity in the left lateral pelvic cul-de-sac. No gross extrinsic compression on the bladder was identified on imaging.  The patient was then seen by her gastroenterologist, Dr. Collene Mares, who performed a colonoscopy which was unremarkable.  Tumor markers were drawn on 08/04/2015 and these included a CA-125 that was elevated to 957, and a CEA that was normal at 1."  On 03/27/2015 the patient underwent diagnostic laparoscopy, exploratory  laparotomy with bilateral salpingo-oophorectomy, omentectomy, and radical tumor debulking with optimal cytoreduction (R0) of what proved to be a stage IIIc right fallopian tube cancer. She was treated adjuvantly with carboplatin and paclitaxel, as detailed below. Her CA 125 dropped from 1226 on 03/20/2015 to 26.1 by 06/16/2015.  Her subsequent history is as detailed below.  INTERVAL HISTORY: Kathryn Ramsey was evaluated in the medical oncology clinic 02/26/2013 accompanied by her husband Kathryn Ramsey. We reviewed her recent history. She tells me she was feeling "normal" October 2017, several months after her last chemotherapy. However on 01/07/2016 her CA 125 was noted to have increased to 69.1. She was restaged with CT scans of the chest abdomen pelvis and head on 01/15/2016. Scans were negative for recurrent disease. On 01/26/2016 a PET scan was likewise negative.  However on 02/13/2016 her CA-125 had risen to 913.9. On 02/24/2016 the patient underwent diagnostic laparoscopy with biopsies showing diffuse miliary studding of 1 mm deposits of tumor throughout the peritoneal cavity (including the anterior abdominal wall, sidewalls, anterior and posterior pelvic cul-de-sac, diaphragms, residual omentum, and serosa). There was less than 100 mL of ascites. The biopsies from this procedure (SZB [id number]) confirmed metastatic carcinoma consistent with serous carcinoma.  Kathryn Ramsey is here today to discuss further management of her recurrent fallopian tube cancer.   REVIEW OF SYSTEMS: Kathryn Ramsey did fine with the recent laparoscopy. She has occasional headaches, which are dull, and somewhat persistent. She thinks these may be due to dry eyes. She tells me her hot flashes are better. She denies any bloating, abdominal swelling, reflux symptoms, early satiety, or any change in bowel or bladder habits. She denies jaundice. They have been no abdominal pain and no abdominal masses. A detailed review of  systems today was noncontributory   PAST  MEDICAL HISTORY: Past Medical History:  Diagnosis Date  . Acute sensory neuropathy ([place]) 06/26/2015  . Allergy   . Anemia   . Asthma    illness induced asthma  . Blood transfusion without reported diagnosis    at age 29 years old D/T surgery to femur being crushed  . 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 Heart murmur    pt, states had a "working heart murmur"  . History of kidney stones   . PONV (postoperative nausea and vomiting)   . Skin cancer     PAST SURGICAL HISTORY: Past Surgical History:  Procedure Laterality Date  . 3 laporscopic proceedures    . ABDOMINAL HYSTERECTOMY     2010  . CHOLECYSTECTOMY     2010  . DILATION AND CURETTAGE OF UTERUS     1997  . LAPAROSCOPY N/A 03/27/2015   Procedure: DIAGNOSTIC LAPAROSCOPY ;  Surgeon: [name], MD;  Location: [facility];  Service: Gynecology;  Laterality: N/A;  . LAPAROSCOPY N/A 02/24/2016   Procedure: LAPAROSCOPY DIAGNOSTIC WITH PERITONEAL WASHINGS AND PERITONEAL BIOPSY;  Surgeon: [name], MD;  Location: [facility];  Service: Gynecology;  Laterality: N/A;  . LAPAROTOMY N/A 03/27/2015   Procedure: EXPLORATORY LAPAROTOMY, BILATERAL SALPINGO OOPHORECTOMY, OMENTECTOMY, RADICAL TUMOR DEBULKING;  Surgeon: [name], MD;  Location: [facility];  Service: Gynecology;  Laterality: N/A;  . sinus surgery 1994      FAMILY HISTORY Family History  Problem Relation Age of Onset  . Hypertension Father   . Skin cancer Father     nonmelanoma skin cancers in his late 55s  . Non-Hodgkin's lymphoma Maternal Aunt     dx. 20s; smoker  . Stroke Maternal Grandmother   . Other Mother     benign meningioma dx. early-mid-70s; hysterectomy in her late 31s for heavy periods - still has ovaries  . Other Son     one son with pre-cancerous skin findings  . Other Daughter     cysts on ovaries and hx of heavy periods  . Other Sister     hysterectomy for cysts - still has ovaries  .  Heart attack Maternal Grandfather   . Heart attack Paternal Grandfather   . Renal cancer Maternal Aunt 60    smoker  . Breast cancer Maternal Aunt 78  . Other Maternal Aunt     dx. benign brain tumor (meningioma) at 78; 2nd benign brain tumor in her late 78s; hx of radical hysterectomy at age 74  . Brain cancer Other     NOS tumor  The patient's parents are both living, in their late 37s as of January 2018. The patient has one brother, 3 sisters. There is no history of ovarian cancer in the family. One maternal aunt had breast and kidney cancer, another had non-Hodgkin's lymphoma.  GYNECOLOGIC HISTORY:  No LMP recorded. Patient has had a hysterectomy. 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 Her husband Kathryn Ramsey is a Engineer, maintenance (IT). Their children are 58, 6, 64 and 11 y/o as of JAN 2018. The oldest works as an Electrical engineer in the [employer] in [place]. The other 3 children live in [place], the youngest currently attending [employer]. Rhe patient has no grandchildren. She attends [employer]  ADVANCED DIRECTIVES:    HEALTH MAINTENANCE: Social History  Substance Use Topics  . Smoking status: Never Smoker  . Smokeless tobacco: Never Used  . Alcohol use Yes     Comment:  3 glasses wine or beer/week     Colonoscopy:2016  PAP: s/p hyst  Bone density:   Allergies  Allergen Reactions  . Bee Venom Shortness Of Breath    swelling  . Sulfa Antibiotics Shortness Of Breath    Vomit , diarrhea , hives    Current Outpatient Prescriptions  Medication Sig Dispense Refill  . B COMPLEX VITAMINS SL Place 1 tablet under the tongue daily.    . clidinium-chlordiazePOXIDE (LIBRAX) 5-2.5 MG capsule Take 1 capsule by mouth at bedtime as needed (IBS symptoms). Reported on 08/11/2015  0  . dexamethasone (DECADRON) 4 MG tablet Take 2 tablets (8 mg total) by mouth daily. Start the day after  chemotherapy for 2 days. 30 tablet 1  . dexamethasone (DECADRON) 4 MG tablet Take 2 tablets (8 mg total) by mouth daily. Start the day after chemotherapy for 2 days. 30 tablet 1  . gabapentin (NEURONTIN) 100 MG capsule Take 1 capsule (100 mg total) by mouth 3 (three) times daily. (Patient taking differently: Take 100 mg by mouth See admin instructions. Takes 158m at bedtime. Takes an additional 1070mduring the day if needed for nerve pain.) 90 capsule 2  . ibuprofen (ADVIL,MOTRIN) 200 MG tablet Take 400 mg by mouth daily as needed for mild pain. Reported on 08/18/2015    . Lactobacillus (PROBIOTIC ACIDOPHILUS PO) Take 1 capsule by mouth every evening.    . Marland KitchenORazepam (ATIVAN) 0.5 MG tablet Take 0.5 mg by mouth at bedtime.    . Marland KitchenORazepam (ATIVAN) 0.5 MG tablet Take 1 tablet (0.5 mg total) by mouth every 6 (six) hours as needed (Nausea or vomiting). 30 tablet 0  . LORazepam (ATIVAN) 0.5 MG tablet Take 1 tablet (0.5 mg total) by mouth every 6 (six) hours as needed (Nausea or vomiting). 30 tablet 0  . Multiple Vitamin (MULTIVITAMIN WITH MINERALS) TABS tablet Take 1 tablet by mouth daily.    . ondansetron (ZOFRAN) 8 MG tablet Take 1 tablet (8 mg total) by mouth 2 (two) times daily as needed for refractory nausea / vomiting. Start on day 3 after chemo. 30 tablet 1  . ondansetron (ZOFRAN) 8 MG tablet Take 1 tablet (8 mg total) by mouth 2 (two) times daily as needed for refractory nausea / vomiting. Start on day 3 after chemo. 30 tablet 1  . oxyCODONE (OXY IR/ROXICODONE) 5 MG immediate release tablet Take 1 tablet (5 mg total) by mouth once as needed (for pain score of 1-4). 30 tablet 0  . prochlorperazine (COMPAZINE) 10 MG tablet Take 1 tablet (10 mg total) by mouth every 6 (six) hours as needed (Nausea or vomiting). 30 tablet 1  . prochlorperazine (COMPAZINE) 10 MG tablet Take 1 tablet (10 mg total) by mouth every 6 (six) hours as needed (Nausea or vomiting). 30 tablet 1  . senna (SENOKOT) 8.6 MG TABS tablet  Take 1 tablet (8.6 mg total) by mouth at bedtime. 120 each 0  . TURMERIC PO Take 1 tablet by mouth daily.     No current facility-administered medications for this visit.     OBJECTIVE: middle aged White woman who appears well Vitals:   02/27/16 1438  BP: 122/73  Pulse: 70  Resp: 20  Temp: 98.2 F (36.8 C)     Body mass index is 21.24  kg/m.    ECOG FS:0 - Asymptomatic  Ocular: Sclerae unicteric, pupils equal, round and reactive to light Ear-nose-throat: Oropharynx clear and moist Lymphatic: No cervical or supraclavicular adenopathy Lungs no rales or rhonchi, good excursion bilaterally Heart regular rate and rhythm, no murmur appreciated Abd soft, nontender, positive bowel sounds; scars from recent laparoscopy healing well MSK no focal spinal tenderness, no joint edema Neuro: non-focal, well-oriented, appropriate affect Breasts: deferred  LAB RESULTS:  CMP     Component Value Date/Time   NA 137 01/07/2016 1102   K 4.2 01/07/2016 1102   CL 104 06/18/2015 1248   CO2 23 01/07/2016 1102   GLUCOSE 91 01/07/2016 1102   BUN 16.4 01/07/2016 1102   CREATININE 0.8 01/07/2016 1102   CALCIUM 10.1 01/07/2016 1102   PROT 7.9 01/07/2016 1102   ALBUMIN 4.3 01/07/2016 1102   AST 20 01/07/2016 1102   ALT 15 01/07/2016 1102   ALKPHOS 58 01/07/2016 1102   BILITOT 0.60 01/07/2016 1102   GFRNONAA >60 06/18/2015 1248   GFRAA >60 06/18/2015 1248    INo results found for: SPEP, UPEP  Lab Results  Component Value Date   WBC 3.6 (L) 02/24/2016   NEUTROABS 1.7 01/07/2016   HGB 12.9 02/24/2016   HCT 36.9 02/24/2016   MCV 91.3 02/24/2016   PLT 229 02/24/2016      Chemistry      Component Value Date/Time   NA 137 01/07/2016 1102   K 4.2 01/07/2016 1102   CL 104 06/18/2015 1248   CO2 23 01/07/2016 1102   BUN 16.4 01/07/2016 1102   CREATININE 0.8 01/07/2016 1102      Component Value Date/Time   CALCIUM 10.1 01/07/2016 1102   ALKPHOS 58 01/07/2016 1102   AST 20 01/07/2016 1102    ALT 15 01/07/2016 1102   BILITOT 0.60 01/07/2016 1102       No results found for: LABCA2  No components found for: LABCA125  No results for input(s): INR in the last 168 hours.  Urinalysis    Component Value Date/Time   COLORURINE YELLOW 03/29/2015 0920   APPEARANCEUR CLEAR 03/29/2015 0920   LABSPEC 1.005 10/09/2015 1239   PHURINE 6.0 10/09/2015 1239   PHURINE 7.5 03/29/2015 0920   GLUCOSEU Negative 10/09/2015 1239   HGBUR Negative 10/09/2015 1239   HGBUR SMALL (A) 03/29/2015 0920   BILIRUBINUR Negative 10/09/2015 1239   KETONESUR Negative 10/09/2015 1239   KETONESUR NEGATIVE 03/29/2015 0920   PROTEINUR Negative 10/09/2015 1239   PROTEINUR NEGATIVE 03/29/2015 0920   UROBILINOGEN 0.2 10/09/2015 1239   NITRITE Negative 10/09/2015 1239   NITRITE NEGATIVE 03/29/2015 0920   LEUKOCYTESUR Negative 10/09/2015 1239     STUDIES: No results found.  ELIGIBLE FOR AVAILABLE RESEARCH PROTOCOL: no  ASSESSMENT: 51 y.o. [place] woman status post radical tumor debulking with optimal cytoreduction (R0) 03/27/2015 for a stage IIIC, high-grade right fallopian tube carcinoma  (a) baseline CA-125 was1226.  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)  (1) adjuvant chemotherapy consisted of carboplatin and paclitaxel for 6 doses, begun 04/14/2015, completed 08/11/2015  (a) paclitaxel was omitted from cycle 5 and dose reduced on cycle 6 because of neuropathy   (b) a "make up" dose of paclitaxel was given 08/25/2015  (c) last carboplatin dose was 08/11/2015  (d) CA-125 normalized by 06/16/2015   (2) FIRST RECURRENCE: January 2018  (a) CEA  rise beginning November 2017 led to CT scans and PET scans December 2017, all negative  (b) exploratory laparotomy 02/24/2016 showed pathologically confirmed recurrence, with miliary  disease involving all examined surfaces  (3) carboplatin/liposomal doxorubicin to start 03/05/2016.  (a) niraparib or olaparib to follow as maintenance  PLAN: We spent the better part of today's hour-long appointment discussing the biology of Ovarian cancer and the upcoming treatment plan.  Kathryn Ramsey had her last carboplatin dose June 2017. She was definitively diagnosed with recurrence January 2018. Accordingly she will be considered "platinum sensitive" and we discussed what this means in both theoretical in practical terms.  She has significant neuropathy residual from our earlier paclitaxel treatments. We are going to go with carboplatin and liposomal doxorubicin and today we discussed the possible toxicities, side effects and complications of this combination. I offered her a refresh her/further review through our chemotherapy teaching nurse but she declined.  She and her family have a trip to [place] planned for March 10 through the 15th. She would like to feel well during that period and planning it all out if we start 03/05/2016 that would work the best.  We discussed port placement. At this point she prefers to continue to receive her treatments by vein.  She wanted to know why the PET scan did not show her recurrence. PET scans are sensitive with anything about a centimeter and her miliary recurrent lesions were smaller than that. Her CA 125 was however a sensitive marker for her recurrence and we will be following this on a monthly basis beginning with her first chemotherapy dose.  She has some mild headaches. We reviewed her head CT scan from November which showed no sinus findings. Nevertheless I would give loratadine a try. Her optometrist thinks her dry eyes may be a cause, and she has been started on TobraDex. If that does not work we will give Restasis a try. I don't think this is going to be metastases to the brain, which are extremely rare in ovarian cancer although not impossible.  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  I'm making her an appointment approximately 10 days after her first cycle. If she tolerates treatment well the plan will be to repeat the cycles every 28 days with intervening visits until the CA-27-29 normalizes, after which she would start on an PARP inhibitor for maintenance.  She knows to call for any problems that may develop before her next visit.  Chauncey Cruel, MD   02/28/2016 10:34 AM Medical Oncology and Hematology [facility] [address] [place], [place] [address] Tel. [phone]    Fax. [phone]

## 2016-02-27 NOTE — Progress Notes (Signed)
START ON PATHWAY REGIMEN - Ovarian  OVOS83: Liposomal Doxorubicin (Doxil) 30 mg/m2 + Carboplatin AUC=5 q28 Days; Stop Treatment After 6 Cycles If Complete Response, Otherwise Continue Treatment Until Progression or Unacceptable Toxicity   A cycle is every 28 days:     Liposomal doxorubicin (Doxil(R)) 30 mg/m2 in 250 mL D5W IV over 60 minutes, initial infusion rate should not exceed 1 mg/min Dose Mod: None     Carboplatin (Paraplatin(R)) AUC=5 in 250 mL NS IV over 1 hour day 1 Dose Mod: None Additional Orders: Use Calvert equation to adjust patient's dose based on renal function.  **Always confirm dose/schedule in your pharmacy ordering system**    Patient Characteristics: Second Line Therapy, 6-12 Months Check here if patient was staged using an edition prior to AJCC Staging - 8th Edition (i.e., prior to February 16, 2016)? false AJCC T Category: T3b AJCC N Category: NX AJCC M Category: M0 AJCC 8 Stage Grouping: IIIB Line of Therapy: Second Line Therapy BRCA Mutation Status: Absent Would you be surprised if this patient died  in the next year? I would be surprised if this patient died in the next year Time since last treatment: 6 - 12 Months  Intent of Therapy: Non-Curative / Palliative Intent, Not Discussed with Patient

## 2016-02-27 NOTE — Telephone Encounter (Signed)
Gave patient avs report and appointments for January thru March. Echo to [facility] for preauth.

## 2016-02-28 DIAGNOSIS — C786 Secondary malignant neoplasm of retroperitoneum and peritoneum: Secondary | ICD-10-CM | POA: Insufficient documentation

## 2016-02-28 DIAGNOSIS — C801 Malignant (primary) neoplasm, unspecified: Secondary | ICD-10-CM

## 2016-03-02 ENCOUNTER — Other Ambulatory Visit: Payer: Self-pay | Admitting: *Deleted

## 2016-03-03 ENCOUNTER — Telehealth: Payer: Self-pay

## 2016-03-03 ENCOUNTER — Other Ambulatory Visit: Payer: Self-pay | Admitting: Emergency Medicine

## 2016-03-03 DIAGNOSIS — C5701 Malignant neoplasm of right fallopian tube: Secondary | ICD-10-CM

## 2016-03-03 DIAGNOSIS — C762 Malignant neoplasm of abdomen: Secondary | ICD-10-CM

## 2016-03-03 NOTE — Telephone Encounter (Signed)
Pt called GYN Onc, stated that she has not heard from Dr Magrinat's nurse regarding her echocardiogram appointment. RN recommended that pt continue to follow up with Val,RN for issue regarding Dr Jana Hakim. RN did send in basket message and also hand delivered the message to his nurse. Pt verbalized understanding.

## 2016-03-04 ENCOUNTER — Ambulatory Visit (HOSPITAL_COMMUNITY)
Admission: RE | Admit: 2016-03-04 | Discharge: 2016-03-04 | Disposition: A | Discharge status: Home / Self Care | Payer: 59 | Source: Ambulatory Visit | Attending: Oncology | Admitting: Oncology

## 2016-03-04 DIAGNOSIS — C5701 Malignant neoplasm of right fallopian tube: Secondary | ICD-10-CM | POA: Diagnosis not present

## 2016-03-04 DIAGNOSIS — C762 Malignant neoplasm of abdomen: Secondary | ICD-10-CM | POA: Diagnosis not present

## 2016-03-04 NOTE — Progress Notes (Signed)
  Echocardiogram 2D Echocardiogram has been performed.  Kathryn Ramsey 03/04/2016, 1:31 PM

## 2016-03-05 ENCOUNTER — Other Ambulatory Visit: Payer: Self-pay | Admitting: *Deleted

## 2016-03-05 ENCOUNTER — Other Ambulatory Visit (HOSPITAL_BASED_OUTPATIENT_CLINIC_OR_DEPARTMENT_OTHER): Payer: 59 | Admitting: *Deleted

## 2016-03-05 ENCOUNTER — Telehealth: Payer: Self-pay | Admitting: Gynecologic Oncology

## 2016-03-05 ENCOUNTER — Ambulatory Visit (HOSPITAL_BASED_OUTPATIENT_CLINIC_OR_DEPARTMENT_OTHER): Payer: 59

## 2016-03-05 ENCOUNTER — Other Ambulatory Visit: Payer: Self-pay

## 2016-03-05 ENCOUNTER — Other Ambulatory Visit: Payer: Self-pay | Admitting: Oncology

## 2016-03-05 ENCOUNTER — Other Ambulatory Visit (HOSPITAL_BASED_OUTPATIENT_CLINIC_OR_DEPARTMENT_OTHER): Payer: 59

## 2016-03-05 VITALS — BP 116/75 | HR 58 | Temp 97.6°F | Resp 16

## 2016-03-05 DIAGNOSIS — C5701 Malignant neoplasm of right fallopian tube: Secondary | ICD-10-CM

## 2016-03-05 DIAGNOSIS — C801 Malignant (primary) neoplasm, unspecified: Secondary | ICD-10-CM

## 2016-03-05 DIAGNOSIS — C762 Malignant neoplasm of abdomen: Secondary | ICD-10-CM

## 2016-03-05 DIAGNOSIS — Z5111 Encounter for antineoplastic chemotherapy: Secondary | ICD-10-CM | POA: Diagnosis not present

## 2016-03-05 DIAGNOSIS — C786 Secondary malignant neoplasm of retroperitoneum and peritoneum: Secondary | ICD-10-CM

## 2016-03-05 LAB — CBC WITH DIFFERENTIAL/PLATELET
BASO%: 0.6 % (ref 0.0–2.0)
Basophils Absolute: 0 10*3/uL (ref 0.0–0.1)
EOS%: 0.5 % (ref 0.0–7.0)
Eosinophils Absolute: 0 10*3/uL (ref 0.0–0.5)
HCT: 40.5 % (ref 34.8–46.6)
HGB: 13.6 g/dL (ref 11.6–15.9)
LYMPH%: 37.5 % (ref 14.0–49.7)
MCH: 32.1 pg (ref 25.1–34.0)
MCHC: 33.6 g/dL (ref 31.5–36.0)
MCV: 95.4 fL (ref 79.5–101.0)
MONO#: 0.4 10*3/uL (ref 0.1–0.9)
MONO%: 8.6 % (ref 0.0–14.0)
NEUT#: 2.7 10*3/uL (ref 1.5–6.5)
NEUT%: 52.8 % (ref 38.4–76.8)
Platelets: 237 10*3/uL (ref 145–400)
RBC: 4.25 10*6/uL (ref 3.70–5.45)
RDW: 12.7 % (ref 11.2–14.5)
WBC: 5.1 10*3/uL (ref 3.9–10.3)
lymph#: 1.9 10*3/uL (ref 0.9–3.3)

## 2016-03-05 LAB — ECHOCARDIOGRAM COMPLETE
E decel time: 236 msec
E/e' ratio: 10
FS: 35 % (ref 28–44)
IVS/LV PW RATIO, ED: 0.86
LA ID, A-P, ES: 32 mm
LA diam end sys: 32 mm
LA diam index: 2.05 cm/m2
LA vol A4C: 39.9 ml
LA vol index: 27.2 mL/m2
LA vol: 42.4 mL
LV E/e' medial: 10
LV E/e'average: 10
LV PW d: 9.52 mm — AB (ref 0.6–1.1)
LV e' LATERAL: 8.05 cm/s
LVOT SV: 51 mL
LVOT VTI: 22.3 cm
LVOT area: 2.27 cm2
LVOT diameter: 17 mm
LVOT peak vel: 101 cm/s
Lateral S' vel: 11.9 cm/s
MV Dec: 236
MV Peak grad: 3 mmHg
MV pk A vel: 61.6 m/s
MV pk E vel: 80.5 m/s
Reg peak vel: 204 cm/s
TAPSE: 24.3 mm
TDI e' lateral: 8.05
TDI e' medial: 8.49
TR max vel: 204 cm/s

## 2016-03-05 LAB — COMPREHENSIVE METABOLIC PANEL
ALT: 15 U/L (ref 0–55)
AST: 18 U/L (ref 5–34)
Albumin: 4.4 g/dL (ref 3.5–5.0)
Alkaline Phosphatase: 54 U/L (ref 40–150)
Anion Gap: 10 mEq/L (ref 3–11)
BUN: 12.9 mg/dL (ref 7.0–26.0)
CO2: 25 mEq/L (ref 22–29)
Calcium: 9.7 mg/dL (ref 8.4–10.4)
Chloride: 101 mEq/L (ref 98–109)
Creatinine: 0.7 mg/dL (ref 0.6–1.1)
EGFR: >90 mL/min/{1.73_m2} (ref >90)
Glucose: 76 mg/dl (ref 70–140)
Potassium: 3.8 mEq/L (ref 3.5–5.1)
Sodium: 136 mEq/L (ref 136–145)
Total Bilirubin: 0.62 mg/dL (ref 0.20–1.20)
Total Protein: 7.5 g/dL (ref 6.4–8.3)

## 2016-03-05 MED ORDER — SODIUM CHLORIDE 0.9 % IV SOLN
480.0000 mg | Freq: Once | INTRAVENOUS | Status: AC
  Administered 2016-03-05: 480 mg via INTRAVENOUS
  Filled 2016-03-05: qty 48

## 2016-03-05 MED ORDER — DOXORUBICIN HCL LIPOSOMAL CHEMO INJECTION 2 MG/ML
30.0000 mg/m2 | Freq: Once | INTRAVENOUS | Status: AC
  Administered 2016-03-05: 46 mg via INTRAVENOUS
  Filled 2016-03-05: qty 23

## 2016-03-05 MED ORDER — DEXAMETHASONE SODIUM PHOSPHATE 10 MG/ML IJ SOLN
INTRAMUSCULAR | Status: AC
  Filled 2016-03-05: qty 1

## 2016-03-05 MED ORDER — PALONOSETRON HCL INJECTION 0.25 MG/5ML
INTRAVENOUS | Status: AC
  Filled 2016-03-05: qty 5

## 2016-03-05 MED ORDER — DEXTROSE 5 % IV SOLN
Freq: Once | INTRAVENOUS | Status: AC
  Administered 2016-03-05: 14:00:00 via INTRAVENOUS

## 2016-03-05 MED ORDER — CARBOPLATIN CHEMO INTRADERMAL TEST DOSE 100MCG/0.02ML
100.0000 ug | Freq: Once | INTRADERMAL | Status: AC
  Administered 2016-03-05: 100 ug via INTRADERMAL
  Filled 2016-03-05: qty 0.02

## 2016-03-05 MED ORDER — DEXAMETHASONE SODIUM PHOSPHATE 10 MG/ML IJ SOLN
10.0000 mg | Freq: Once | INTRAMUSCULAR | Status: AC
  Administered 2016-03-05: 10 mg via INTRAVENOUS

## 2016-03-05 MED ORDER — PALONOSETRON HCL INJECTION 0.25 MG/5ML
0.2500 mg | Freq: Once | INTRAVENOUS | Status: AC
  Administered 2016-03-05: 0.25 mg via INTRAVENOUS

## 2016-03-05 NOTE — Patient Instructions (Addendum)
[facility] Discharge Instructions for Patients Receiving Chemotherapy  Today you received the following chemotherapy agents: Doxil and Carboplatin   To help prevent nausea and vomiting after your treatment, we encourage you to take your nausea medication {as directed.    If you develop nausea and vomiting that is not controlled by your nausea medication, call the clinic.   BELOW ARE SYMPTOMS THAT SHOULD BE REPORTED IMMEDIATELY:  *FEVER GREATER THAN 100.5 F  *CHILLS WITH OR WITHOUT FEVER  NAUSEA AND VOMITING THAT IS NOT CONTROLLED WITH YOUR NAUSEA MEDICATION  *UNUSUAL SHORTNESS OF BREATH  *UNUSUAL BRUISING OR BLEEDING  TENDERNESS IN MOUTH AND THROAT WITH OR WITHOUT PRESENCE OF ULCERS  *URINARY PROBLEMS  *BOWEL PROBLEMS  UNUSUAL RASH Items with * indicate a potential emergency and should be followed up as soon as possible.  Feel free to call the clinic you have any questions or concerns. The clinic phone number is [phone].  Please show the [facility] at check-in to the Emergency Department and triage nurse.  Doxorubicin Liposomal injection (Doxcil)  What is this medicine? LIPOSOMAL DOXORUBICIN (LIP oh som al dox oh ROO bi sin) is a chemotherapy drug. This medicine is used to treat many kinds of cancer like Kaposi's sarcoma, multiple myeloma, and ovarian cancer. This medicine may be used for other purposes; ask your health care provider or pharmacist if you have questions. COMMON BRAND NAME(S): Doxil, Lipodox What should I tell my health care provider before I take this medicine? They need to know if you have any of these conditions: -blood disorders -heart disease -infection (especially a virus infection such as chickenpox, cold sores, or herpes) -liver disease -recent or ongoing radiation therapy -an unusual or allergic reaction to doxorubicin, other chemotherapy agents, soybeans, other medicines, foods, dyes, or preservatives -pregnant or  trying to get pregnant -breast-feeding How should I use this medicine? This drug is given as an infusion into a vein. It is administered in a hospital or clinic by a specially trained health care professional. If you have pain, swelling, burning or any unusual feeling around the site of your injection, tell your health care professional right away. Talk to your pediatrician regarding the use of this medicine in children. Special care may be needed. Overdosage: If you think you have taken too much of this medicine contact a poison control center or emergency room at once. NOTE: This medicine is only for you. Do not share this medicine with others. What if I miss a dose? It is important not to miss your dose. Call your doctor or health care professional if you are unable to keep an appointment. What may interact with this medicine? Do not take this medicine with any of the following medications: -zidovudine This medicine may also interact with the following medications: -medicines to increase blood counts like filgrastim, pegfilgrastim, sargramostim -vaccines Talk to your doctor or health care professional before taking any of these medicines: -acetaminophen -aspirin -ibuprofen -ketoprofen -naproxen This list may not describe all possible interactions. Give your health care provider a list of all the medicines, herbs, non-prescription drugs, or dietary supplements you use. Also tell them if you smoke, drink alcohol, or use illegal drugs. Some items may interact with your medicine. What should I watch for while using this medicine? Your condition will be monitored carefully while you are receiving this medicine. You will need important blood work done while you are taking this medicine. This drug may make you feel generally unwell. This is  not uncommon, as chemotherapy can affect healthy cells as well as cancer cells. Report any side effects. Continue your course of treatment even though you  feel ill unless your doctor tells you to stop. Your urine may turn orange-red for a few days after your dose. This is not blood. If your urine is dark or brown, call your doctor. In some cases, you may be given additional medicines to help with side effects. Follow all directions for their use. Call your doctor or health care professional for advice if you get a fever (100.5 degrees F or higher), chills or sore throat, or other symptoms of a cold or flu. Do not treat yourself. This drug decreases your body's ability to fight infections. Try to avoid being around people who are sick. This medicine may increase your risk to bruise or bleed. Call your doctor or health care professional if you notice any unusual bleeding. Be careful brushing and flossing your teeth or using a toothpick because you may get an infection or bleed more easily. If you have any dental work done, tell your dentist you are receiving this medicine. Avoid taking products that contain aspirin, acetaminophen, ibuprofen, naproxen, or ketoprofen unless instructed by your doctor. These medicines may hide a fever. Men and women of childbearing age should use effective birth control methods while using taking this medicine. Do not become pregnant while taking this medicine. There is a potential for serious side effects to an unborn child. Talk to your health care professional or pharmacist for more information. Do not breast-feed an infant while taking this medicine. Talk to your doctor about your risk of cancer. You may be more at risk for certain types of cancers if you take this medicine. What side effects may I notice from receiving this medicine? Side effects that you should report to your doctor or health care professional as soon as possible: -allergic reactions like skin rash, itching or hives, swelling of the face, lips, or tongue -low blood counts - this medicine may decrease the number of white blood cells, red blood cells and  platelets. You may be at increased risk for infections and bleeding. -signs of hand-foot syndrome - tingling or burning, redness, flaking, swelling, small blisters, or small sores on the palms of your hands or the soles of your feet -signs of infection - fever or chills, cough, sore throat, pain or difficulty passing urine -signs of decreased platelets or bleeding - bruising, pinpoint red spots on the skin, black, tarry stools, blood in the urine -signs of decreased red blood cells - unusually weak or tired, fainting spells, lightheadedness -back pain, chills, facial flushing, fever, headache, tightness in the chest or throat during the infusion -breathing problems -chest pain -fast, irregular heartbeat -mouth pain, redness, sores -pain, swelling, redness at site where injected -pain, tingling, numbness in the hands or feet -swelling of ankles, feet, or hands -vomiting Side effects that usually do not require medical attention (report to your doctor or health care professional if they continue or are bothersome): -diarrhea -hair loss -loss of appetite -nail discoloration or damage -nausea -red or watery eyes -red colored urine -stomach upset This list may not describe all possible side effects. Call your doctor for medical advice about side effects. You may report side effects to FDA at 1-800-FDA-1088. Where should I keep my medicine? This drug is given in a hospital or clinic and will not be stored at home. NOTE: This sheet is a summary. It may not cover all possible  information. If you have questions about this medicine, talk to your doctor, pharmacist, or health care provider.  2017 Elsevier/Gold Standard (2011-10-22 10:12:56)

## 2016-03-05 NOTE — Telephone Encounter (Signed)
Patient was assessed in the chemo room due to concerns about bulging areas around her incisions.  Incisions assessed.  Firm areas with the two right sided lap sites noted.  No evidence of herniation.  Round areas palpated under the incisions most likely hematomas.  Dr. Denman George aware.  Per Dr. Denman George, she can try heat to the area.  Reportable signs and symptoms reviewed.  She is to call for any changes or concerns.

## 2016-03-06 LAB — CA 125: Cancer Antigen (CA) 125: 577.6 U/mL — ABNORMAL HIGH (ref 0.0–38.1)

## 2016-03-08 ENCOUNTER — Encounter: Payer: Self-pay | Admitting: Gynecologic Oncology

## 2016-03-08 ENCOUNTER — Telehealth: Payer: Self-pay | Admitting: Gynecologic Oncology

## 2016-03-08 ENCOUNTER — Ambulatory Visit: Payer: 59 | Attending: Gynecologic Oncology | Admitting: Gynecologic Oncology

## 2016-03-08 VITALS — BP 114/83 | HR 79 | Temp 97.7°F | Resp 18 | Ht 63.0 in | Wt 118.7 lb

## 2016-03-08 DIAGNOSIS — Z808 Family history of malignant neoplasm of other organs or systems: Secondary | ICD-10-CM | POA: Insufficient documentation

## 2016-03-08 DIAGNOSIS — Z8051 Family history of malignant neoplasm of kidney: Secondary | ICD-10-CM | POA: Diagnosis not present

## 2016-03-08 DIAGNOSIS — C5701 Malignant neoplasm of right fallopian tube: Secondary | ICD-10-CM | POA: Diagnosis present

## 2016-03-08 DIAGNOSIS — C569 Malignant neoplasm of unspecified ovary: Secondary | ICD-10-CM | POA: Diagnosis not present

## 2016-03-08 DIAGNOSIS — R1903 Right lower quadrant abdominal swelling, mass and lump: Secondary | ICD-10-CM | POA: Insufficient documentation

## 2016-03-08 DIAGNOSIS — Z8249 Family history of ischemic heart disease and other diseases of the circulatory system: Secondary | ICD-10-CM | POA: Insufficient documentation

## 2016-03-08 DIAGNOSIS — Z803 Family history of malignant neoplasm of breast: Secondary | ICD-10-CM | POA: Diagnosis not present

## 2016-03-08 DIAGNOSIS — Z79899 Other long term (current) drug therapy: Secondary | ICD-10-CM | POA: Insufficient documentation

## 2016-03-08 NOTE — Telephone Encounter (Signed)
Called to check on patient's current status.  She had called the after hours service over the weekend about the bulging areas around her two right sided lap incisions.  She states they have definitely grown over the weekend.  No visible skin lesions per the patient, no visible bruising.  She states she discussed the situation with a friend who is a neurologist and he said it is most likely her immune system reacting to the stitch material.  She applied heat to the areas over the weekend as well.  Appt made today for Kathryn Ramsey to see Dr. Denman George.

## 2016-03-08 NOTE — Patient Instructions (Signed)
Plan to have an abdominal ultrasound this Friday at [facility].  Nothing to eat or drink after midnight the night before.  We will contact you with the results.  Please call for any questions or concerns.
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
## 2016-03-11 ENCOUNTER — Telehealth: Payer: Self-pay | Admitting: Gynecologic Oncology

## 2016-03-11 ENCOUNTER — Ambulatory Visit (HOSPITAL_COMMUNITY)
Admission: RE | Admit: 2016-03-11 | Discharge: 2016-03-11 | Disposition: A | Discharge status: Home / Self Care | Payer: 59 | Source: Ambulatory Visit | Attending: Gynecologic Oncology | Admitting: Gynecologic Oncology

## 2016-03-11 DIAGNOSIS — C569 Malignant neoplasm of unspecified ovary: Secondary | ICD-10-CM | POA: Diagnosis present

## 2016-03-11 DIAGNOSIS — R1903 Right lower quadrant abdominal swelling, mass and lump: Secondary | ICD-10-CM | POA: Insufficient documentation

## 2016-03-11 NOTE — Telephone Encounter (Signed)
Patient advised of ultrasound results.  Stating she is very anxious and she feels they are growing larger overnight.  Stating she is terribly constipated, getting just "tiny balls" when taking sennakot and colace. Heat seems to help.  Advised the patient that her concerns would be addressed with Dr. Denman George, who would give her a call after her surgery cases today.  Verbalizing understanding.  Advised to call for any questions or concerns in between that time or after if they arise.

## 2016-03-12 ENCOUNTER — Ambulatory Visit (HOSPITAL_COMMUNITY): Payer: 59

## 2016-03-12 ENCOUNTER — Other Ambulatory Visit: Payer: Self-pay | Admitting: *Deleted

## 2016-03-12 DIAGNOSIS — C801 Malignant (primary) neoplasm, unspecified: Secondary | ICD-10-CM

## 2016-03-12 DIAGNOSIS — C5701 Malignant neoplasm of right fallopian tube: Secondary | ICD-10-CM

## 2016-03-12 DIAGNOSIS — C786 Secondary malignant neoplasm of retroperitoneum and peritoneum: Secondary | ICD-10-CM

## 2016-03-12 DIAGNOSIS — C762 Malignant neoplasm of abdomen: Secondary | ICD-10-CM

## 2016-03-15 ENCOUNTER — Other Ambulatory Visit (HOSPITAL_BASED_OUTPATIENT_CLINIC_OR_DEPARTMENT_OTHER): Payer: 59

## 2016-03-15 ENCOUNTER — Other Ambulatory Visit: Payer: Self-pay | Admitting: *Deleted

## 2016-03-15 ENCOUNTER — Ambulatory Visit (HOSPITAL_BASED_OUTPATIENT_CLINIC_OR_DEPARTMENT_OTHER): Payer: 59 | Admitting: Oncology

## 2016-03-15 VITALS — BP 113/64 | HR 68 | Temp 97.7°F | Resp 18 | Ht 63.0 in | Wt 119.8 lb

## 2016-03-15 DIAGNOSIS — C786 Secondary malignant neoplasm of retroperitoneum and peritoneum: Secondary | ICD-10-CM

## 2016-03-15 DIAGNOSIS — C762 Malignant neoplasm of abdomen: Secondary | ICD-10-CM

## 2016-03-15 DIAGNOSIS — C5701 Malignant neoplasm of right fallopian tube: Secondary | ICD-10-CM

## 2016-03-15 DIAGNOSIS — C801 Malignant (primary) neoplasm, unspecified: Secondary | ICD-10-CM

## 2016-03-15 LAB — CBC WITH DIFFERENTIAL/PLATELET
BASO%: 0.4 % (ref 0.0–2.0)
Basophils Absolute: 0 10*3/uL (ref 0.0–0.1)
EOS%: 0.2 % (ref 0.0–7.0)
Eosinophils Absolute: 0 10*3/uL (ref 0.0–0.5)
HCT: 37.3 % (ref 34.8–46.6)
HGB: 12.8 g/dL (ref 11.6–15.9)
LYMPH%: 41.4 % (ref 14.0–49.7)
MCH: 32 pg (ref 25.1–34.0)
MCHC: 34.4 g/dL (ref 31.5–36.0)
MCV: 93 fL (ref 79.5–101.0)
MONO#: 0.1 10*3/uL (ref 0.1–0.9)
MONO%: 4 % (ref 0.0–14.0)
NEUT#: 1.8 10*3/uL (ref 1.5–6.5)
NEUT%: 54 % (ref 38.4–76.8)
Platelets: 201 10*3/uL (ref 145–400)
RBC: 4 10*6/uL (ref 3.70–5.45)
RDW: 12.5 % (ref 11.2–14.5)
WBC: 3.3 10*3/uL — ABNORMAL LOW (ref 3.9–10.3)
lymph#: 1.3 10*3/uL (ref 0.9–3.3)

## 2016-03-15 LAB — COMPREHENSIVE METABOLIC PANEL
ALT: 19 U/L (ref 0–55)
AST: 20 U/L (ref 5–34)
Albumin: 4.5 g/dL (ref 3.5–5.0)
Alkaline Phosphatase: 53 U/L (ref 40–150)
Anion Gap: 11 mEq/L (ref 3–11)
BUN: 8.7 mg/dL (ref 7.0–26.0)
CO2: 27 mEq/L (ref 22–29)
Calcium: 9.8 mg/dL (ref 8.4–10.4)
Chloride: 102 mEq/L (ref 98–109)
Creatinine: 0.7 mg/dL (ref 0.6–1.1)
EGFR: >90 mL/min/{1.73_m2} (ref >90)
Glucose: 96 mg/dl (ref 70–140)
Potassium: 3.7 mEq/L (ref 3.5–5.1)
Sodium: 140 mEq/L (ref 136–145)
Total Bilirubin: 0.57 mg/dL (ref 0.20–1.20)
Total Protein: 7.5 g/dL (ref 6.4–8.3)

## 2016-03-15 NOTE — Progress Notes (Signed)
[place]  Telephone:([phone] Fax:(336) [phone]     ID: Kathryn Ramsey DOB: 12-18-1965  MR#: [id number]  EZM#:[id number]  Patient Care Team: Kathryn Stains, MD as PCP - General (Family Medicine) Kathryn Levan, MD as Consulting Physician (Oncology) Kathryn Amber, MD as Consulting Physician (Obstetrics and Gynecology) Kathryn Craver, MD as Consulting Physician (Gastroenterology) Kathryn Salina, MD as Consulting Physician (Obstetrics and Gynecology) Kathryn Retort, MD as Consulting Physician (Urology) Kathryn Cruel, MD OTHER MD:  CHIEF COMPLAINT: Recurrent ovarian cancer  CURRENT TREATMENT: Carboplatin, liposomal doxorubicin   HISTORY OF PRESENT ILLNESS: From Dr. 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 The patient has a history of a workup by urology for gross hematuria which included a pelvic examine was suggested for extrinsic mass effect on the bladder on cystoscopy. Cystoscopy took place on in December 2016. The patient denies abdominal pain bloating early satiety or abdominal distention.  On 08/03/2015 at [facility] she underwent a CT scan of the abdomen and pelvis as ordered by Dr Kathryn Ramsey. This revealed a 1.4 cm low attenuation lesion on the posterior right hepatic lobe suggestive of a hemangioma. Status post hysterectomy. No ovarian masses. Moderate ascites. Omental caking seen in the lateral left abdomen and pelvis. Peritoneal nodularity in the left lateral pelvic cul-de-sac. No gross extrinsic compression on the bladder was identified on imaging.  The patient was then seen by her gastroenterologist, Dr. Collene Ramsey, who performed a colonoscopy which was unremarkable.  Tumor markers were drawn on 08/04/2015 and these included a CA-125 that was elevated to 957, and a CEA that was normal at 1."  On 03/27/2015 the  patient underwent diagnostic laparoscopy, exploratory laparotomy with bilateral salpingo-oophorectomy, omentectomy, and radical tumor debulking with optimal cytoreduction (R0) of what proved to be a stage IIIc right fallopian tube cancer. She was treated adjuvantly with carboplatin and paclitaxel, as detailed below. Her CA 125 dropped from 1226 on 03/20/2015 to 26.1 by 06/16/2015.  Her subsequent history is as detailed below.  INTERVAL HISTORY: Kathryn Ramsey returns today for follow-up of her recurrent ovarian cancer accompanied by her husband Kathryn Ramsey. Today is day 11 cycle 1 of carboplatin and liposomal doxorubicin, which she will receive every 28 days. [name] did generally well with the first cycle. Her mouth is a little bit raw she says but there have been no actual source or thrush. She is very much in touch with her body, she tells me, and she can feel the lymph nodes in her armpit for example "working", even though she does not palpate them. She did well in terms of access (she refused a port) with no phlebitis or other complications.   She generally did very well the first couple of days and then by day 4 was having some nausea. This is of course the time when we go off the dexamethasone. She took Zofran for this but it caused her some headaches and constipation. She remained constipated for over a week. She was taking some senna and some stool softeners but clearly not enough. She did not take anything for the headaches. Other problems include insomnia, pain in her back and abdomen, heartburn, and hot flashes.  Since her last visit here she also developed 2 nodules in the chest wall. These were evaluated by Dr. Denman Ramsey with an ultrasound and they are consistent with port-site metastases.  REVIEW OF SYSTEMS: A  detailed review of systems today was stable except as noted above  PAST MEDICAL HISTORY: Past Medical History:  Diagnosis Date  . Acute sensory neuropathy (Kathryn Ramsey) 06/26/2015  . Allergy   . Anemia   .  Asthma    illness induced asthma  . Blood transfusion without reported diagnosis    at age 25 years old D/T surgery to femur being crushed  . 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 Heart murmur    pt, states had a "working heart murmur"  . History of kidney stones   . PONV (postoperative nausea and vomiting)   . Skin cancer     PAST SURGICAL HISTORY: Past Surgical History:  Procedure Laterality Date  . 3 laporscopic proceedures    . ABDOMINAL HYSTERECTOMY     2010  . CHOLECYSTECTOMY     2010  . DILATION AND CURETTAGE OF UTERUS     1997  . LAPAROSCOPY N/A 03/27/2015   Procedure: DIAGNOSTIC LAPAROSCOPY ;  Surgeon: Kathryn Amber, MD;  Location: [facility];  Service: Gynecology;  Laterality: N/A;  . LAPAROSCOPY N/A 02/24/2016   Procedure: LAPAROSCOPY DIAGNOSTIC WITH PERITONEAL WASHINGS AND PERITONEAL BIOPSY;  Surgeon: Kathryn Amber, MD;  Location: [facility];  Service: Gynecology;  Laterality: N/A;  . LAPAROTOMY N/A 03/27/2015   Procedure: EXPLORATORY LAPAROTOMY, BILATERAL SALPINGO OOPHORECTOMY, OMENTECTOMY, RADICAL TUMOR DEBULKING;  Surgeon: Kathryn Amber, MD;  Location: [facility];  Service: Gynecology;  Laterality: N/A;  . sinus surgery 1994      FAMILY HISTORY Family History  Problem Relation Age of Onset  . Hypertension Father   . Skin cancer Father     nonmelanoma skin cancers in his late 70s  . Non-Hodgkin's lymphoma Maternal Aunt     dx. 4s; smoker  . Stroke Maternal Grandmother   . Other Mother     benign meningioma dx. early-mid-70s; hysterectomy in her late 34s for heavy periods - still has ovaries  . Other Son     one son with pre-cancerous skin findings  . Other Daughter     cysts on ovaries and hx of heavy periods  . Other Sister     hysterectomy for cysts - still has ovaries  . Heart attack Maternal Grandfather   . Heart attack Paternal Grandfather   . Renal cancer Maternal Aunt 60    smoker  . Breast  cancer Maternal Aunt 78  . Other Maternal Aunt     dx. benign brain tumor (meningioma) at 41; 2nd benign brain tumor in her late 17s; hx of radical hysterectomy at age 59  . Brain cancer Other     NOS tumor  The patient's parents are both living, in their late 55s as of January 2018. The patient has one brother, 3 sisters. There is no history of ovarian cancer in the family. One maternal aunt had breast and kidney cancer, another had non-Hodgkin's lymphoma.  GYNECOLOGIC HISTORY:  No LMP recorded. Patient has had a hysterectomy. 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 Her husband Kathryn Ramsey is a Engineer, maintenance (IT). Their children are 65, 65, 79 and 29 y/o as of JAN 2018. The oldest works as an Electrical engineer in the [employer] in [place]. The other 3 children live in [place], the youngest currently attending [employer]. Rhe patient has no grandchildren. She  attends [employer] DIRECTIVES:    HEALTH MAINTENANCE: Social History  Substance Use Topics  . Smoking status: Never Smoker  . Smokeless tobacco: Never Used  . Alcohol use Yes     Comment:  3 glasses wine or beer/week     Colonoscopy:2016  PAP: s/p hyst  Bone density:   Allergies  Allergen Reactions  . Bee Venom Shortness Of Breath    swelling  . Sulfa Antibiotics Shortness Of Breath    Vomit , diarrhea , hives    Current Outpatient Prescriptions  Medication Sig Dispense Refill  . B COMPLEX VITAMINS SL Place 1 tablet under the tongue daily.    . clidinium-chlordiazePOXIDE (LIBRAX) 5-2.5 MG capsule Take 1 capsule by mouth at bedtime as needed (IBS symptoms). Reported on 08/11/2015  0  . dexamethasone (DECADRON) 4 MG tablet Take 2 tablets (8 mg total) by mouth daily. Start the day after chemotherapy for 2 days. 30 tablet 1  . dexamethasone (DECADRON) 4 MG tablet Take 2 tablets (8 mg total) by mouth daily. Start the day  after chemotherapy for 2 days. 30 tablet 1  . gabapentin (NEURONTIN) 100 MG capsule Take 1 capsule (100 mg total) by mouth 3 (three) times daily. (Patient taking differently: Take 100 mg by mouth See admin instructions. Takes 163m at bedtime. Takes an additional 1062mduring the day if needed for nerve pain.) 90 capsule 2  . ibuprofen (ADVIL,MOTRIN) 200 MG tablet Take 400 mg by mouth daily as needed for mild pain. Reported on 08/18/2015    . Lactobacillus (PROBIOTIC ACIDOPHILUS PO) Take 1 capsule by mouth every evening.    . Marland KitchenORazepam (ATIVAN) 0.5 MG tablet Take 0.5 mg by mouth at bedtime.    . Marland KitchenORazepam (ATIVAN) 0.5 MG tablet Take 1 tablet (0.5 mg total) by mouth every 6 (six) hours as needed (Nausea or vomiting). 30 tablet 0  . LORazepam (ATIVAN) 0.5 MG tablet Take 1 tablet (0.5 mg total) by mouth every 6 (six) hours as needed (Nausea or vomiting). 30 tablet 0  . Multiple Vitamin (MULTIVITAMIN WITH MINERALS) TABS tablet Take 1 tablet by mouth daily.    . ondansetron (ZOFRAN) 8 MG tablet Take 1 tablet (8 mg total) by mouth 2 (two) times daily as needed for refractory nausea / vomiting. Start on day 3 after chemo. 30 tablet 1  . ondansetron (ZOFRAN) 8 MG tablet Take 1 tablet (8 mg total) by mouth 2 (two) times daily as needed for refractory nausea / vomiting. Start on day 3 after chemo. 30 tablet 1  . oxyCODONE (OXY IR/ROXICODONE) 5 MG immediate release tablet Take 1 tablet (5 mg total) by mouth once as needed (for pain score of 1-4). 30 tablet 0  . prochlorperazine (COMPAZINE) 10 MG tablet Take 1 tablet (10 mg total) by mouth every 6 (six) hours as needed (Nausea or vomiting). 30 tablet 1  . prochlorperazine (COMPAZINE) 10 MG tablet Take 1 tablet (10 mg total) by mouth every 6 (six) hours as needed (Nausea or vomiting). 30 tablet 1  . senna (SENOKOT) 8.6 MG TABS tablet Take 1 tablet (8.6 mg total) by mouth at bedtime. 120 each 0  . TURMERIC PO Take 1 tablet by mouth daily.     No current  facility-administered medications for this visit.     OBJECTIVE: middle aged White woman  Who appears stated age Vi2  03/15/16 1257  BP: 113/64  Pulse: 68  Resp: 18  Temp: 97.7 F (36.5 C)  Body mass index is 21.22 kg/m.    ECOG FS:1 - Symptomatic but completely ambulatory  Sclerae unicteric, EOMs intact Oropharynx clear and moist--no thrush, no ulceration, no sores  No cervical or supraclavicular adenopathy, No axillary adenopathy  Lungs no rales or rhonchi Heart regular rate and rhythm Abd soft, nontender, positive bowel sounds; there are 2 subcutaneous masses at the side of port scars, each measuring approximately 2-1/2-3 cm by palpation, nontender, not erythematous. These are imaged below.  MSK no focal spinal tenderness, no upper extremity lymphedema Neuro: nonfocal, well oriented, tearful affect  Breasts: Deferred  Photo of right lower quadrant port-site metastases 03/15/2016      LAB RESULTS:  CMP     Component Value Date/Time   NA 140 03/15/2016 1236   K 3.7 03/15/2016 1236   CL 104 06/18/2015 1248   CO2 27 03/15/2016 1236   GLUCOSE 96 03/15/2016 1236   BUN 8.7 03/15/2016 1236   CREATININE 0.7 03/15/2016 1236   CALCIUM 9.8 03/15/2016 1236   PROT 7.5 03/15/2016 1236   ALBUMIN 4.5 03/15/2016 1236   AST 20 03/15/2016 1236   ALT 19 03/15/2016 1236   ALKPHOS 53 03/15/2016 1236   BILITOT 0.57 03/15/2016 1236   GFRNONAA >60 06/18/2015 1248   GFRAA >60 06/18/2015 1248    INo results found for: SPEP, UPEP  Lab Results  Component Value Date   WBC 3.3 (L) 03/15/2016   NEUTROABS 1.8 03/15/2016   HGB 12.8 03/15/2016   HCT 37.3 03/15/2016   MCV 93.0 03/15/2016   PLT 201 03/15/2016      Chemistry      Component Value Date/Time   NA 140 03/15/2016 1236   K 3.7 03/15/2016 1236   CL 104 06/18/2015 1248   CO2 27 03/15/2016 1236   BUN 8.7 03/15/2016 1236   CREATININE 0.7 03/15/2016 1236      Component Value Date/Time   CALCIUM 9.8 03/15/2016 1236    ALKPHOS 53 03/15/2016 1236   AST 20 03/15/2016 1236   ALT 19 03/15/2016 1236   BILITOT 0.57 03/15/2016 1236       No results found for: LABCA2  No components found for: LABCA125  No results for input(s): INR in the last 168 hours.  Urinalysis    Component Value Date/Time   COLORURINE YELLOW 03/29/2015 0920   APPEARANCEUR CLEAR 03/29/2015 0920   LABSPEC 1.005 10/09/2015 1239   PHURINE 6.0 10/09/2015 1239   PHURINE 7.5 03/29/2015 0920   GLUCOSEU Negative 10/09/2015 1239   HGBUR Negative 10/09/2015 1239   HGBUR SMALL (A) 03/29/2015 0920   BILIRUBINUR Negative 10/09/2015 1239   KETONESUR Negative 10/09/2015 1239   KETONESUR NEGATIVE 03/29/2015 0920   PROTEINUR Negative 10/09/2015 1239   PROTEINUR NEGATIVE 03/29/2015 0920   UROBILINOGEN 0.2 10/09/2015 1239   NITRITE Negative 10/09/2015 1239   NITRITE NEGATIVE 03/29/2015 0920   LEUKOCYTESUR Negative 10/09/2015 1239     STUDIES: US Abdomen Limited  Result Date: 03/11/2016 CLINICAL DATA:  Palpable masses in right lower quadrant of abdomen. EXAM: LIMITED ABDOMINAL ULTRASOUND COMPARISON:  CT scan of January 15, 2016. FINDINGS: Limited sonographic evaluation in area of palpable concern in right lower quadrant of abdomen demonstrates complex abnormality with cystic component measuring 3.4 x 2.2 x 0.8 cm. Another complex abnormality measuring 3.2 x 2.6 x 1.0 cm is noted just inferior to the other abnormality. It is uncertain if these abnormalities represent possible hernias or masses. IMPRESSION: Two complex abnormalities are noted in the area of palpable concern  in right lower quadrant of abdomen. Is uncertain if these represent solid masses or potentially hernias. CT scan is recommended for further evaluation. Electronically Signed   By: Marijo Conception, M.D.   On: 03/11/2016 08:28    ELIGIBLE FOR AVAILABLE RESEARCH PROTOCOL: no  ASSESSMENT: 51 y.o. [place] woman status post radical tumor debulking with optimal cytoreduction  (R0) 03/27/2015 for a stage IIIC, high-grade right fallopian tube carcinoma  (a) baseline CA-125 was1226.  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)  (1) adjuvant chemotherapy consisted of carboplatin and paclitaxel for 6 doses, begun 04/14/2015, completed 08/11/2015  (a) paclitaxel was omitted from cycle 5 and dose reduced on cycle 6 because of neuropathy   (b) a "make up" dose of paclitaxel was given 08/25/2015  (c) last carboplatin dose was 08/11/2015  (d) CA-125 normalized by 06/16/2015   (2) FIRST RECURRENCE: January 2018  (a) CEA rise beginning November 2017 led to CT scans and PET scans December 2017, all negative  (b) exploratory laparotomy 02/24/2016 showed pathologically confirmed recurrence, with miliary disease involving all examined surfaces  (3) carboplatin/liposomal doxorubicin to start 03/05/2016.  (a) niraparib or olaparib to follow as maintenance  PLAN: I spent approximately 30 minutes with Lelan Pons with most of that time spent discussing her complex problems. She did generally well with her first cycle of chemotherapy, but there are a couple of minor adjustments that I think will make her life better. If she is a little bit more aggressive with her constipation, she can easily resolve that. She is using some Colace and senna. She has MiraLAX on hand but is not using it. She tells me she is willing to do MiraLAX daily beginning the day of chemotherapy. She would also like to add probiotics which I am comfortable with.  She is using Zofran beginning on day 4 which is very appropriate. This can worsen constipation and can cause headaches. She is welcome to use some Tylenol for the headaches if she wishes.   She is interested in using supplements. We discussed again the fact that high doses of vitamins can  rescue cancer cells from chemotherapy so anything that is more than 100% of the daily allowance on any particular vitamin she should stay away from.  She is appropriately concerned and anxious regarding the subcutaneous nodules she has developed. Hopefully with continuing treatment disease as well as the rest of her tumor will received.  She is very intent on following the CA 125. She requested a repeat today. I was glad to facilitate that. Generally we will be checking it once a month, at the time of chemotherapy.  I wonder if it would help if we added lorazepam to her premeds read it might help to disconnect the anxiety most patients feel coming here and getting treated with some of the later as side effects. He certainly does tend to decrease the psychogenic nausea problem in many patients. She is amenable to this and I have added the appropriate  Stanton Kidney is somewhat distraught by the subcutaneous metastases that developed so quickly at the port exit sites. She understands that we would expect these to be treated just has any other part of the cancer would be treated by the chemotherapy. We don't expect rapid turnaround but we do expect control and then eventual shrinkage. If we do not see significant change after 2 more  cycles or if we see if he can growth beyond the next cycle we will have to reconsider the treatment plan  Otherwise she is already scheduled to see Dr. Denman Ramsey next week and she will see me again the day of cycle 2,  04/02/2016. She knows to call for any problems that may develop before then.   Kathryn Cruel, MD   03/15/2016 1:19 PM Medical Oncology and Hematology [facility] [address] [place], [place] [address] Tel. [phone]    Fax. [phone]

## 2016-03-16 ENCOUNTER — Telehealth: Payer: Self-pay | Admitting: *Deleted

## 2016-03-16 LAB — CA 125: Cancer Antigen (CA) 125: 451 U/mL — ABNORMAL HIGH (ref 0.0–38.1)

## 2016-03-16 NOTE — Telephone Encounter (Signed)
This RN left message on VM to return call regarding lab results.  Noted decrease in tumor marker pt requested to have rechecked.

## 2016-03-20 ENCOUNTER — Telehealth: Payer: Self-pay | Admitting: Oncology

## 2016-03-20 NOTE — Telephone Encounter (Signed)
S/w pt, advised appt time chgd on 2/16. New start time is 8.30am. Pt verbalized understanding.

## 2016-03-22 ENCOUNTER — Ambulatory Visit: Payer: 59 | Attending: Gynecologic Oncology | Admitting: Gynecologic Oncology

## 2016-03-22 ENCOUNTER — Encounter: Payer: Self-pay | Admitting: Gynecologic Oncology

## 2016-03-22 VITALS — BP 136/77 | HR 77 | Temp 98.1°F | Resp 18 | Ht 63.0 in | Wt 120.0 lb

## 2016-03-22 DIAGNOSIS — C786 Secondary malignant neoplasm of retroperitoneum and peritoneum: Secondary | ICD-10-CM | POA: Diagnosis not present

## 2016-03-22 DIAGNOSIS — Z87442 Personal history of urinary calculi: Secondary | ICD-10-CM | POA: Insufficient documentation

## 2016-03-22 DIAGNOSIS — Z90710 Acquired absence of both cervix and uterus: Secondary | ICD-10-CM | POA: Insufficient documentation

## 2016-03-22 DIAGNOSIS — C7989 Secondary malignant neoplasm of other specified sites: Secondary | ICD-10-CM | POA: Diagnosis not present

## 2016-03-22 DIAGNOSIS — Z8249 Family history of ischemic heart disease and other diseases of the circulatory system: Secondary | ICD-10-CM | POA: Insufficient documentation

## 2016-03-22 DIAGNOSIS — Z8742 Personal history of other diseases of the female genital tract: Secondary | ICD-10-CM | POA: Diagnosis not present

## 2016-03-22 DIAGNOSIS — C5701 Malignant neoplasm of right fallopian tube: Secondary | ICD-10-CM | POA: Diagnosis present

## 2016-03-22 DIAGNOSIS — Z808 Family history of malignant neoplasm of other organs or systems: Secondary | ICD-10-CM | POA: Insufficient documentation

## 2016-03-22 DIAGNOSIS — Z90722 Acquired absence of ovaries, bilateral: Secondary | ICD-10-CM | POA: Diagnosis not present

## 2016-03-22 DIAGNOSIS — Z79899 Other long term (current) drug therapy: Secondary | ICD-10-CM | POA: Insufficient documentation

## 2016-03-22 DIAGNOSIS — Z882 Allergy status to sulfonamides status: Secondary | ICD-10-CM | POA: Diagnosis not present

## 2016-03-22 DIAGNOSIS — Z85828 Personal history of other malignant neoplasm of skin: Secondary | ICD-10-CM | POA: Diagnosis not present

## 2016-03-22 NOTE — Patient Instructions (Signed)
Follow up with Dr. Jana Hakim as scheduled. Follow up with Dr. Denman George after chemotherapy completed.

## 2016-03-22 NOTE — Progress Notes (Signed)
FOLLOW UP VISIT: FALLOPIAN TUBE CANCER  Assessment:    51 y.o. 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    S/p diagnostic laparoscopy on 02/24/16. S/p cycle 1 of Doxil and Carboplatin salvage therapy (day 1 on 03/05/16). Development of RLQ port site mets postop. BRCA negative.  Plan: Plan on [name]. Continue chemotherapy. If progressive size of these lesions over 2 complete cycles, would consider biopsy and consideration for changing therapy for gross progression of disease.  I recommend that she continue on Doxil and Carboplatin. If her CA 125 normalizes but these masses remain I would recommend FNA/core biopsy to determine if they have residual viable tumor, in which case I would recommend resection at that time (provided imaging showed no other disease in the peritoneal cavity). If her CA 125 normalizes and the masses completely regress, no surgical intervention would be necessary.  After normalization or plateau of the CA125 she should be considered for PARP inihibitor maintenance with Rucaparib.  HPI:  Kathryn Ramsey is a very pleasant 51 year old G4P4 who was originally seen in consultation on 03/17/15 at the request of Dr Collene Mares and Dr Garwin Brothers for peritoneal carcinomatosis. The patient has a history of a workup by urology for gross hematuria which included a pelvic examine was suggested for extrinsic mass effect on the bladder on cystoscopy. Cystoscopy took place on in December 2016. The patient denies abdominal pain bloating early satiety or abdominal distention.  On 08/03/2015 at [facility] she underwent a CT scan of the abdomen and pelvis as ordered by Dr Baruch Gouty. This revealed a 1.4 cm low attenuation lesion on the posterior right hepatic lobe suggestive of a hemangioma. Status post hysterectomy. No ovarian masses. Moderate ascites. Omental caking seen in the lateral left abdomen and pelvis. Peritoneal nodularity in the left  lateral pelvic cul-de-sac. No gross extrinsic compression on the bladder was identified on imaging.  The patient was then seen by her gastroenterologist, Dr. Collene Mares, who performed a colonoscopy which was unremarkable.  Tumor markers were drawn on 08/04/2015 and these included a CA-125 that was elevated to 957, and a CEA that was normal at 1.  The patient is otherwise a very healthy woman. She is an Futures trader. She has had a history of 4 spontaneous vaginal deliveries. She has a remote history of endometriosis. She is limited history of oral contraceptive pill usage (possibly 2 months). She a history of primary infertility and was treated with Clomid for her first pregnancy.  Her prior endometriosis was identified and treated with 3 laparoscopies. Her only other abdominal surgery was a laparoscopic cholecystectomy, and an LAVH on March 15th 2010 with removal of a right paratubal cyst for a fibroid uterus and menorrhagia. This was performed by Dr. Servando Salina.   She has no remarkable history for malignancy. Her maternal aunt had non-hodgkins lymphoma.  On 03/27/15 she underwent diagnostic laparoscopy, exploratory laparotomy, BSO, omentectomy, argon beam ablation of tumor implants for an optimal cytoreduction (R0) for high grade serous fallopian tube cancer. Final pathology confirmed that her primary tumor was the right fallopian tube.  She had an uncomplicated postoperative stay in hospital and was discharged on POD 2.  She was evaluated in the office on POD 7 for increased abdominal discomfort and feeling a protrusion in her upper abdomen.CT scan confirmed no hernia.  She went on to receive 6 cycles of carboplatin and paclitaxel adjuvant therapy between 04/14/15 and 08/11/15. She developed severe neuropathy symptoms  which necessitated treatment delay of cycle 5 and weekly scheduling of taxol on cycle 6.  CA 125 normalized quickly during primary treatment:  1226 on 03/20/15 165 on  04/28/15 44 on 05/22/15 26.1 on 06/16/15 18.7 on 08/11/15 17 on 7/17/7 15 on 09/29/15.  Post-treatment imaging with CT chest,abdo, pelvis on 09/29/15 showed complete resolution of ascites and peritoneal implants. There was a stable 1.5cm liver hemangioma present.  On 01/06/16 she felt unwell with a headache and requested labs. On 01/07/16 CA 125 was noted to be elevated at 69.1.  On 01/16/16 she had a CT scan which showed no measurable recurrence. On 01/26/16 a PET/CT showed no measurable recurrence.  Repeat CA 125 on 02/13/16 was substantially elevated at 914.   Interval Hx:  On 02/24/16 she was taken to the operating room for a diagnostic laparoscopy which confirmed low volume carcinomatosis. This was biopsied and confirmed to be high grade serous carcinoma, consistent with recurrent ovarian cancer.  Since surgery she has restarted on chemotherapy with Carboplatin and Doxil with Dr Jana Hakim. Day 1 of cycle 1 was 03/05/16.  Since 02/28/15 she began noticing increasing firmness and lumps around the 2 right lower quadrant 53m port sites. These areas are not very painful, not draining, normal overlying skin, but slowly progressing in size.  Ultrasound on 03/11/16: Limited sonographic evaluation in area of palpable concern in right lower quadrant of abdomen demonstrates complex abnormality with cystic component measuring 3.4 x 2.2 x 0.8 cm. Another complex abnormality measuring 3.2 x 2.6 x 1.0 cm is noted just inferior to the other abnormality. It is uncertain if these abnormalities represent possible hernias or masses.   Past Medical History:  Diagnosis Date  . [place]  . Allergy   . Anemia   . Asthma    illness induced asthma  . Blood transfusion without reported diagnosis    at age s5years old D/T surgery to femur being crushed  . 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 Heart murmur     pt, states had a "working heart murmur"  . History of kidney stones   . PONV (postoperative nausea and vomiting)   . Skin cancer    Past Surgical History:  Procedure Laterality Date  . 3 laporscopic proceedures    . ABDOMINAL HYSTERECTOMY     2010  . CHOLECYSTECTOMY     2010  . DILATION AND CURETTAGE OF UTERUS     1997  . LAPAROSCOPY N/A 03/27/2015   Procedure: DIAGNOSTIC LAPAROSCOPY ;  Surgeon: EEveritt Amber MD;  Location: [facility];  Service: Gynecology;  Laterality: N/A;  . LAPAROSCOPY N/A 02/24/2016   Procedure: LAPAROSCOPY DIAGNOSTIC WITH PERITONEAL WASHINGS AND PERITONEAL BIOPSY;  Surgeon: EEveritt Amber MD;  Location: [facility];  Service: Gynecology;  Laterality: N/A;  . LAPAROTOMY N/A 03/27/2015   Procedure: EXPLORATORY LAPAROTOMY, BILATERAL SALPINGO OOPHORECTOMY, OMENTECTOMY, RADICAL TUMOR DEBULKING;  Surgeon: EEveritt Amber MD;  Location: [facility];  Service: Gynecology;  Laterality: N/A;  . sinus surgery 1994     Family History  Problem Relation Age of Onset  . Hypertension Father   . Skin cancer Father     nonmelanoma skin cancers in his late 867s . Non-Hodgkin's lymphoma Maternal Aunt     dx. 637s smoker  . Stroke Maternal Grandmother   . Other Mother     benign meningioma dx. early-mid-70s; hysterectomy in her  late 14s for heavy periods - still has ovaries  . Other Son     one son with pre-cancerous skin findings  . Other Daughter     cysts on ovaries and hx of heavy periods  . Other Sister     hysterectomy for cysts - still has ovaries  . Heart attack Maternal Grandfather   . Heart attack Paternal Grandfather   . Renal cancer Maternal Aunt 60    smoker  . Breast cancer Maternal Aunt 78  . Other Maternal Aunt     dx. benign brain tumor (meningioma) at 39; 2nd benign brain tumor in her late 25s; hx of radical hysterectomy at age 10  . Brain cancer Other     NOS tumor   Social History   Social History  . Marital status: Married    Spouse name: N/A  . Number of children: 4   . 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 Smoking status: Never Smoker  . Smokeless tobacco: Never Used  . Alcohol use Yes     Comment:  3 glasses wine or beer/week  . Drug use: No  . Sexual activity: Not on file   Other Topics Concern  . Not on file   Social History Narrative   Lives at home w/ husband and children   Right-handed   3 cups caffeine per day   Allergies  Allergen Reactions  . Bee Venom Shortness Of Breath    swelling  . Sulfa Antibiotics Shortness Of Breath    Vomit , diarrhea , hives   Current Outpatient Prescriptions on File Prior to Visit  Medication Sig Dispense Refill  . B COMPLEX VITAMINS SL Place 1 tablet under the tongue daily.    . clidinium-chlordiazePOXIDE (LIBRAX) 5-2.5 MG capsule Take 1 capsule by mouth at bedtime as needed (IBS symptoms). Reported on 08/11/2015  0  . dexamethasone (DECADRON) 4 MG tablet Take 2 tablets (8 mg total) by mouth daily. Start the day after chemotherapy for 2 days. 30 tablet 1  . dexamethasone (DECADRON) 4 MG tablet Take 2 tablets (8 mg total) by mouth daily. Start the day after chemotherapy for 2 days. 30 tablet 1  . gabapentin (NEURONTIN) 100 MG capsule Take 1 capsule (100 mg total) by mouth 3 (three) times daily. (Patient taking differently: Take 100 mg by mouth See admin instructions. Takes 112m at bedtime. Takes an additional 1048mduring the day if needed for nerve pain.) 90 capsule 2  . ibuprofen (ADVIL,MOTRIN) 200 MG tablet Take 400 mg by mouth daily as needed for mild pain. Reported on 08/18/2015    . Lactobacillus (PROBIOTIC ACIDOPHILUS PO) Take 1 capsule by mouth every evening.    . Marland KitchenORazepam (ATIVAN) 0.5 MG tablet Take 0.5 mg by mouth at bedtime.    . Marland KitchenORazepam (ATIVAN) 0.5 MG tablet Take 1 tablet (0.5 mg total) by mouth every 6 (six) hours as needed (Nausea or vomiting). 30 tablet 0  . LORazepam (ATIVAN) 0.5 MG tablet Take 1 tablet (0.5 mg total) by mouth  every 6 (six) hours as needed (Nausea or vomiting). 30 tablet 0  . Multiple Vitamin (MULTIVITAMIN WITH MINERALS) TABS tablet Take 1 tablet by mouth daily.    . ondansetron (ZOFRAN) 8 MG tablet Take 1 tablet (8 mg total) by mouth 2 (two) times daily as needed for refractory nausea / vomiting. Start on day 3 after chemo. 30 tablet 1  .  ondansetron (ZOFRAN) 8 MG tablet Take 1 tablet (8 mg total) by mouth 2 (two) times daily as needed for refractory nausea / vomiting. Start on day 3 after chemo. 30 tablet 1  . oxyCODONE (OXY IR/ROXICODONE) 5 MG immediate release tablet Take 1 tablet (5 mg total) by mouth once as needed (for pain score of 1-4). 30 tablet 0  . prochlorperazine (COMPAZINE) 10 MG tablet Take 1 tablet (10 mg total) by mouth every 6 (six) hours as needed (Nausea or vomiting). 30 tablet 1  . prochlorperazine (COMPAZINE) 10 MG tablet Take 1 tablet (10 mg total) by mouth every 6 (six) hours as needed (Nausea or vomiting). 30 tablet 1  . senna (SENOKOT) 8.6 MG TABS tablet Take 1 tablet (8.6 mg total) by mouth at bedtime. 120 each 0  . TURMERIC PO Take 1 tablet by mouth daily.     No current facility-administered medications on file prior to visit.     Review of systems: Constitutional:  She has no weight gain or weight loss. She has no fever or chills. Eyes: No blurred vision Ears, Nose, Mouth, Throat: No dizziness, headaches or changes in hearing. No mouth sores. Cardiovascular: No chest pain, palpitations or edema. Respiratory:  No shortness of breath, wheezing or cough Gastrointestinal: She has normal bowel movements without diarrhea or constipation. She denies any nausea or vomiting. She denies blood in her stool or heart burn. Genitourinary:  She denies pelvic pain, pelvic pressure or changes in her urinary function. She has no hematuria, dysuria, or incontinence. She has no irregular vaginal bleeding or vaginal discharge + vaginal dryness Musculoskeletal: Denies muscle weakness or joint  pains.  Skin:  She has no skin changes, rashes or itching Neurological:  + peripheral neuropathy Psychiatric:  She denies depression or anxiety. Hematologic/Lymphatic:   No easy bruising or bleeding   Physical Exam: Blood pressure 136/77, pulse 77, temperature 98.1 F (36.7 C), temperature source Oral, resp. rate 18, height '5\' 3"'  (1.6 m), weight 120 lb (54.4 kg), SpO2 100 %. General: Well dressed, well nourished in no apparent distress.   HEENT:  Normocephalic and atraumatic, no lesions.  Extraocular muscles intact. Sclerae anicteric. Pupils equal, round, reactive. No mouth sores or ulcers. Thyroid is normal size, not nodular, midline. Skin:  No lesions or rashes. Lungs:  Clear to auscultation bilaterally.  No wheezes. Cardiovascular:  Regular rate and rhythm.  No murmurs or rubs. Abdomen:  Soft, nontender, nondistended.  No palpable masses.  No hepatosplenomegaly.  No ascites. Normal bowel sounds.  2cm firm mobile nodule underlying upper right laparoscopic port and 2cm firm mobile abdominal wall nodule on lower incision. Non fluctuant, minimally tender. No drainage. Overlying skin is normal and incision well healed. Some decrease in size over 2 week period. Genitourinary: No pelvic masses, normal external genitalia, normal vagina (atrophic), well healed cuff, surgically absent cervix and uterus. Extremities: No cyanosis, clubbing or edema.  No calf tenderness or erythema. No palpable cords. Psychiatric: Mood and affect are appropriate. Neurological: Awake, alert and oriented x 3. Sensation is intact, no neuropathy.  Musculoskeletal: No pain, normal strength and range of motion.   [name], MD

## 2016-03-25 ENCOUNTER — Ambulatory Visit
Admission: RE | Admit: 2016-03-25 | Discharge: 2016-03-25 | Disposition: A | Discharge status: Home / Self Care | Payer: 59 | Source: Ambulatory Visit | Attending: Gynecologic Oncology | Admitting: Gynecologic Oncology

## 2016-03-25 DIAGNOSIS — Z1231 Encounter for screening mammogram for malignant neoplasm of breast: Secondary | ICD-10-CM

## 2016-03-29 ENCOUNTER — Other Ambulatory Visit: Payer: BLUE CROSS/BLUE SHIELD

## 2016-03-29 ENCOUNTER — Ambulatory Visit: Payer: BLUE CROSS/BLUE SHIELD | Admitting: Hematology and Oncology

## 2016-03-30 ENCOUNTER — Telehealth: Payer: Self-pay | Admitting: *Deleted

## 2016-03-30 NOTE — Telephone Encounter (Signed)
Pt called states "I have a dry white area near my uretha, it's like a peeling area to the skin, its not cracked, and theres' no discharge or itching." Pt has history of Lichen sclerosus previous rx given to pt for Clobetosol. Pt denied fever, not using any OTC medication to the area of concern. Discussed with pt I will forward concerns to MD for review and call back with additional information.  Advised pt MD is out of the office today. Pt verbalized understanding.

## 2016-03-31 ENCOUNTER — Ambulatory Visit: Payer: 59 | Admitting: Gynecologic Oncology

## 2016-03-31 ENCOUNTER — Other Ambulatory Visit: Payer: Self-pay | Admitting: Gynecologic Oncology

## 2016-03-31 DIAGNOSIS — N904 Leukoplakia of vulva: Secondary | ICD-10-CM

## 2016-03-31 MED ORDER — CLOBETASOL PROPIONATE 0.05 % EX OINT: 1.0000 "application " | TOPICAL_OINTMENT | Freq: Two times a day (BID) | CUTANEOUS | 0 refills | Status: AC

## 2016-03-31 NOTE — Telephone Encounter (Signed)
Patient had called regarding history of lichen sclerosis and prior use of clobetasol. Now has itchy lesion on the anterior vulva. Spoke with my nurse who discussed case with me.  Likely diagnosis is either recurrent lichen sclerosis or reaction to Doxil (skin reactions common).  I am recommending topical steroid (clobetasol).  Patient called back because clobetasol has expired.  Left message that I will phone in new prescription.   Kathryn Eva, MD

## 2016-04-01 ENCOUNTER — Other Ambulatory Visit: Payer: Self-pay | Admitting: *Deleted

## 2016-04-01 DIAGNOSIS — C5701 Malignant neoplasm of right fallopian tube: Secondary | ICD-10-CM

## 2016-04-01 DIAGNOSIS — C762 Malignant neoplasm of abdomen: Secondary | ICD-10-CM

## 2016-04-01 DIAGNOSIS — C801 Malignant (primary) neoplasm, unspecified: Secondary | ICD-10-CM

## 2016-04-01 DIAGNOSIS — C786 Secondary malignant neoplasm of retroperitoneum and peritoneum: Secondary | ICD-10-CM

## 2016-04-02 ENCOUNTER — Other Ambulatory Visit (HOSPITAL_BASED_OUTPATIENT_CLINIC_OR_DEPARTMENT_OTHER): Payer: 59

## 2016-04-02 ENCOUNTER — Ambulatory Visit (HOSPITAL_BASED_OUTPATIENT_CLINIC_OR_DEPARTMENT_OTHER): Payer: 59 | Admitting: Oncology

## 2016-04-02 ENCOUNTER — Other Ambulatory Visit: Payer: Self-pay | Admitting: Oncology

## 2016-04-02 ENCOUNTER — Ambulatory Visit: Payer: 59

## 2016-04-02 ENCOUNTER — Ambulatory Visit (HOSPITAL_BASED_OUTPATIENT_CLINIC_OR_DEPARTMENT_OTHER): Payer: 59

## 2016-04-02 ENCOUNTER — Other Ambulatory Visit: Payer: 59

## 2016-04-02 VITALS — BP 118/69 | HR 60 | Temp 97.6°F | Resp 20 | Ht 63.0 in | Wt 119.4 lb

## 2016-04-02 DIAGNOSIS — C786 Secondary malignant neoplasm of retroperitoneum and peritoneum: Secondary | ICD-10-CM

## 2016-04-02 DIAGNOSIS — C762 Malignant neoplasm of abdomen: Secondary | ICD-10-CM | POA: Diagnosis not present

## 2016-04-02 DIAGNOSIS — C5701 Malignant neoplasm of right fallopian tube: Secondary | ICD-10-CM

## 2016-04-02 DIAGNOSIS — Z5111 Encounter for antineoplastic chemotherapy: Secondary | ICD-10-CM | POA: Diagnosis not present

## 2016-04-02 DIAGNOSIS — C801 Malignant (primary) neoplasm, unspecified: Secondary | ICD-10-CM

## 2016-04-02 LAB — CBC WITH DIFFERENTIAL/PLATELET
BASO%: 0 % (ref 0.0–2.0)
Basophils Absolute: 0 10*3/uL (ref 0.0–0.1)
EOS%: 0.3 % (ref 0.0–7.0)
Eosinophils Absolute: 0 10*3/uL (ref 0.0–0.5)
HCT: 37.8 % (ref 34.8–46.6)
HGB: 13.1 g/dL (ref 11.6–15.9)
LYMPH%: 40.5 % (ref 14.0–49.7)
MCH: 32.3 pg (ref 25.1–34.0)
MCHC: 34.7 g/dL (ref 31.5–36.0)
MCV: 93.1 fL (ref 79.5–101.0)
MONO#: 0.5 10*3/uL (ref 0.1–0.9)
MONO%: 13.5 % (ref 0.0–14.0)
NEUT#: 1.8 10*3/uL (ref 1.5–6.5)
NEUT%: 45.7 % (ref 38.4–76.8)
Platelets: 260 10*3/uL (ref 145–400)
RBC: 4.06 10*6/uL (ref 3.70–5.45)
RDW: 13.6 % (ref 11.2–14.5)
WBC: 3.9 10*3/uL (ref 3.9–10.3)
lymph#: 1.6 10*3/uL (ref 0.9–3.3)

## 2016-04-02 LAB — COMPREHENSIVE METABOLIC PANEL
ALT: 17 U/L (ref 0–55)
AST: 19 U/L (ref 5–34)
Albumin: 4.7 g/dL (ref 3.5–5.0)
Alkaline Phosphatase: 60 U/L (ref 40–150)
Anion Gap: 11 mEq/L (ref 3–11)
BUN: 13 mg/dL (ref 7.0–26.0)
CO2: 27 mEq/L (ref 22–29)
Calcium: 10.2 mg/dL (ref 8.4–10.4)
Chloride: 99 mEq/L (ref 98–109)
Creatinine: 0.7 mg/dL (ref 0.6–1.1)
EGFR: >90 mL/min/{1.73_m2} (ref >90)
Glucose: 73 mg/dl (ref 70–140)
Potassium: 4.1 mEq/L (ref 3.5–5.1)
Sodium: 137 mEq/L (ref 136–145)
Total Bilirubin: 0.5 mg/dL (ref 0.20–1.20)
Total Protein: 8.1 g/dL (ref 6.4–8.3)

## 2016-04-02 MED ORDER — DEXTROSE 5 % IV SOLN
30.0000 mg/m2 | Freq: Once | INTRAVENOUS | Status: AC
  Administered 2016-04-02: 46 mg via INTRAVENOUS
  Filled 2016-04-02: qty 23

## 2016-04-02 MED ORDER — DEXTROSE 5 % IV SOLN
Freq: Once | INTRAVENOUS | Status: AC
  Administered 2016-04-02: 11:00:00 via INTRAVENOUS

## 2016-04-02 MED ORDER — LORAZEPAM 2 MG/ML IJ SOLN
INTRAMUSCULAR | Status: AC
  Filled 2016-04-02: qty 1

## 2016-04-02 MED ORDER — PALONOSETRON HCL INJECTION 0.25 MG/5ML
INTRAVENOUS | Status: AC
  Filled 2016-04-02: qty 5

## 2016-04-02 MED ORDER — DEXAMETHASONE SODIUM PHOSPHATE 10 MG/ML IJ SOLN
INTRAMUSCULAR | Status: AC
  Filled 2016-04-02: qty 1

## 2016-04-02 MED ORDER — SODIUM CHLORIDE 0.9 % IV SOLN
478.0000 mg | Freq: Once | INTRAVENOUS | Status: AC
  Administered 2016-04-02: 480 mg via INTRAVENOUS
  Filled 2016-04-02: qty 48

## 2016-04-02 MED ORDER — LORAZEPAM 2 MG/ML IJ SOLN
0.5000 mg | Freq: Once | INTRAMUSCULAR | Status: AC
  Administered 2016-04-02: 0.5 mg via INTRAVENOUS

## 2016-04-02 MED ORDER — DEXAMETHASONE SODIUM PHOSPHATE 10 MG/ML IJ SOLN
10.0000 mg | Freq: Once | INTRAMUSCULAR | Status: AC
  Administered 2016-04-02: 10 mg via INTRAVENOUS

## 2016-04-02 MED ORDER — PALONOSETRON HCL INJECTION 0.25 MG/5ML
0.2500 mg | Freq: Once | INTRAVENOUS | Status: AC
  Administered 2016-04-02: 0.25 mg via INTRAVENOUS

## 2016-04-02 MED ORDER — CARBOPLATIN CHEMO INTRADERMAL TEST DOSE 100MCG/0.02ML
100.0000 ug | Freq: Once | INTRADERMAL | Status: AC
  Administered 2016-04-02: 100 ug via INTRADERMAL
  Filled 2016-04-02: qty 0.01

## 2016-04-02 NOTE — Progress Notes (Signed)
Kathryn Ramsey: Kathryn Ramsey as PCP - General (Family Medicine) Kathryn Ramsey as Consulting Physician (Oncology) Kathryn Ramsey as Consulting Physician (Obstetrics and Gynecology) Kathryn Ramsey as Consulting Physician (Gastroenterology) Kathryn Ramsey as Consulting Physician (Obstetrics and Gynecology) Kathryn Ramsey as Consulting Physician (Urology) Kathryn Ramsey OTHER Ramsey:  CHIEF COMPLAINT: Recurrent ovarian cancer  CURRENT TREATMENT: Carboplatin, liposomal doxorubicin   HISTORY OF PRESENT ILLNESS: From Kathryn Ramsey original intake note 03/17/2015:  "Kathryn Ramsey is a very pleasant G4P4 who is seen in consultation at the request of Kathryn Ramsey and Kathryn Ramsey for peritoneal carcinomatosis. The patient has a history of a workup by urology for gross hematuria which included a pelvic examine was suggested for extrinsic mass effect on the bladder on cystoscopy. Cystoscopy took place on in December 2016. The patient denies abdominal pain bloating early satiety or abdominal distention.  On 08/03/2015 at [facility] she underwent a CT scan of the abdomen and pelvis as ordered by Kathryn Ramsey. This revealed a 1.4 cm low attenuation lesion on the posterior right hepatic lobe suggestive of a hemangioma. Status post hysterectomy. No ovarian masses. Moderate ascites. Omental caking seen in the lateral left abdomen and pelvis. Peritoneal nodularity in the left lateral pelvic cul-de-sac. No gross extrinsic compression on the bladder was identified on imaging.  The patient was then seen by her gastroenterologist, Kathryn Ramsey, who performed a colonoscopy which was unremarkable.  Tumor markers were drawn on 08/04/2015 and these included a CA-125 that was elevated to 957, and a CEA that was normal at 1."  On 03/27/2015 the  patient underwent diagnostic laparoscopy, exploratory laparotomy with bilateral salpingo-oophorectomy, omentectomy, and radical tumor debulking with optimal cytoreduction (R0) of what proved to be a stage IIIc right fallopian tube cancer. She was treated adjuvantly with carboplatin and paclitaxel, as detailed below. Her CA 125 dropped from 1226 on 03/20/2015 to 26.1 by 06/16/2015.  Her subsequent history is as detailed below.  INTERVAL HISTORY: Kathryn Ramsey returns today for follow-up of her recurrent fallopian to carcinoma, accompanied by her sister. Today is day 1 cycle 2 of carboplatin and liposomal doxorubicin which Kathryn Ramsey receives every 28 days.  She tolerated the first cycle remarkably well. She has not lost her hair. She is very active, does walking, yoga, and all sorts of activities. She gets nausea on day 4 which she controls with Zofran. She did get very constipated with the first cycle and I asked her to take stool softeners and MiraLAX beginning today, but she has not done that. She is using probiotics and magnesium. She has had no mouth sores, no problems with her port.  She is following the CA-125 closely and is delighted that it has gone down by 50% are ready. She is also following the port site metastases and a seem to her to be a little bit smaller   REVIEW OF SYSTEMS: She urinates normally but at the end of urination she feels like a pressure in her bladder. It is not a burning. There has been no hematuria. She has some discomfort on her shins and feet, which she describes as numbnes and tingling. This goes all the way from the toes to his shins. However it is not constant. For example at this moment she has no numbness or tingling at all. It is not worse  at night. She has a little bit of irritation in her throat, which makes her clear her throat and cough a little bit at times. Aside from these issues a detailed review of systems today was noncontributory   PAST MEDICAL HISTORY: Past  Medical History:  Diagnosis Date  . Acute sensory neuropathy ([place]) 06/26/2015  . Allergy   . Anemia   . Asthma    illness induced asthma  . Blood transfusion without reported diagnosis    at age 20 years old D/T surgery to femur being crushed  . 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 Heart murmur    pt, states had a "working heart murmur"  . History of kidney stones   . PONV (postoperative nausea and vomiting)   . Skin cancer     PAST SURGICAL HISTORY: Past Surgical History:  Procedure Laterality Date  . 3 laporscopic proceedures    . ABDOMINAL HYSTERECTOMY     2010  . CHOLECYSTECTOMY     2010  . DILATION AND CURETTAGE OF UTERUS     1997  . LAPAROSCOPY N/A 03/27/2015   Procedure: DIAGNOSTIC LAPAROSCOPY ;  Surgeon: Kathryn Ramsey;  Location: [facility];  Service: Gynecology;  Laterality: N/A;  . LAPAROSCOPY N/A 02/24/2016   Procedure: LAPAROSCOPY DIAGNOSTIC WITH PERITONEAL WASHINGS AND PERITONEAL BIOPSY;  Surgeon: Kathryn Ramsey;  Location: [facility];  Service: Gynecology;  Laterality: N/A;  . LAPAROTOMY N/A 03/27/2015   Procedure: EXPLORATORY LAPAROTOMY, BILATERAL SALPINGO OOPHORECTOMY, OMENTECTOMY, RADICAL TUMOR DEBULKING;  Surgeon: Kathryn Ramsey;  Location: [facility];  Service: Gynecology;  Laterality: N/A;  . sinus surgery 1994      FAMILY HISTORY Family History  Problem Relation Age of Onset  . Hypertension Father   . Skin cancer Father     nonmelanoma skin cancers in his late 72s  . Non-Hodgkin's lymphoma Maternal Aunt     dx. 15s; smoker  . Stroke Maternal Grandmother   . Other Mother     benign meningioma dx. early-mid-70s; hysterectomy in her late 18s for heavy periods - still has ovaries  . Other Son     one son with pre-cancerous skin findings  . Other Daughter     cysts on ovaries and hx of heavy periods  . Other Sister     hysterectomy for cysts - still has ovaries  . Heart attack Maternal  Grandfather   . Heart attack Paternal Grandfather   . Renal cancer Maternal Aunt 60    smoker  . Breast cancer Maternal Aunt 78  . Other Maternal Aunt     dx. benign brain tumor (meningioma) at 38; 2nd benign brain tumor in her late 34s; hx of radical hysterectomy at age 83  . Brain cancer Other     NOS tumor  The patient's parents are both living, in their late 28s as of January 2018. The patient has one brother, 3 sisters. There is no history of ovarian cancer in the family. One maternal aunt had breast and kidney cancer, another had non-Hodgkin's lymphoma.  GYNECOLOGIC HISTORY:  No LMP recorded. Patient has had a hysterectomy. 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 Her husband [name] is a Engineer, maintenance (IT). Their children are 87, 86, 39 and 23 y/o as of JAN 2018. The oldest works as  an Electrical engineer in the [employer] in [place]. The other 3 children live in [place], the youngest currently attending [employer]. Rhe patient has no grandchildren. She attends [employer]    ADVANCED DIRECTIVES:    HEALTH MAINTENANCE: Social History  Substance Use Topics  . Smoking status: Never Smoker  . Smokeless tobacco: Never Used  . Alcohol use Yes     Comment:  3 glasses wine or beer/week     Colonoscopy:2016  PAP: s/p hyst  Bone density:   Allergies  Allergen Reactions  . Bee Venom Shortness Of Breath    swelling  . Sulfa Antibiotics Shortness Of Breath    Vomit , diarrhea , hives    Current Outpatient Prescriptions  Medication Sig Dispense Refill  . B COMPLEX VITAMINS SL Place 1 tablet under the tongue daily.    . clidinium-chlordiazePOXIDE (LIBRAX) 5-2.5 MG capsule Take 1 capsule by mouth at bedtime as needed (IBS symptoms). Reported on 08/11/2015  0  . clobetasol ointment (TEMOVATE) 8.65 % Apply 1 application topically 2 (two) times daily. For 1 month 30 g 0  . dexamethasone  (DECADRON) 4 MG tablet Take 2 tablets (8 mg total) by mouth daily. Start the day after chemotherapy for 2 days. 30 tablet 1  . dexamethasone (DECADRON) 4 MG tablet Take 2 tablets (8 mg total) by mouth daily. Start the day after chemotherapy for 2 days. 30 tablet 1  . gabapentin (NEURONTIN) 100 MG capsule Take 1 capsule (100 mg total) by mouth 3 (three) times daily. (Patient taking differently: Take 100 mg by mouth See admin instructions. Takes '100mg'$  at bedtime. Takes an additional '100mg'$  during the day if needed for nerve pain.) 90 capsule 2  . ibuprofen (ADVIL,MOTRIN) 200 MG tablet Take 400 mg by mouth daily as needed for mild pain. Reported on 08/18/2015    . Lactobacillus (PROBIOTIC ACIDOPHILUS PO) Take 1 capsule by mouth every evening.    [name] LORazepam (ATIVAN) 0.5 MG tablet Take 0.5 mg by mouth at bedtime.    [name] LORazepam (ATIVAN) 0.5 MG tablet Take 1 tablet (0.5 mg total) by mouth every 6 (six) hours as needed (Nausea or vomiting). 30 tablet 0  . LORazepam (ATIVAN) 0.5 MG tablet Take 1 tablet (0.5 mg total) by mouth every 6 (six) hours as needed (Nausea or vomiting). 30 tablet 0  . Multiple Vitamin (MULTIVITAMIN WITH MINERALS) TABS tablet Take 1 tablet by mouth daily.    . ondansetron (ZOFRAN) 8 MG tablet Take 1 tablet (8 mg total) by mouth 2 (two) times daily as needed for refractory nausea / vomiting. Start on day 3 after chemo. 30 tablet 1  . ondansetron (ZOFRAN) 8 MG tablet Take 1 tablet (8 mg total) by mouth 2 (two) times daily as needed for refractory nausea / vomiting. Start on day 3 after chemo. 30 tablet 1  . oxyCODONE (OXY IR/ROXICODONE) 5 MG immediate release tablet Take 1 tablet (5 mg total) by mouth once as needed (for pain score of 1-4). 30 tablet 0  . prochlorperazine (COMPAZINE) 10 MG tablet Take 1 tablet (10 mg total) by mouth every 6 (six) hours as needed (Nausea or vomiting). 30 tablet 1  . prochlorperazine (COMPAZINE) 10 MG tablet Take 1 tablet (10 mg total) by mouth every 6 (six)  hours as needed (Nausea or vomiting). 30 tablet 1  . senna (SENOKOT) 8.6 MG TABS tablet Take 1 tablet (8.6 mg total) by mouth at bedtime. 120 each 0  . TURMERIC PO Take 1 tablet by  mouth daily.     No current facility-administered medications for this visit.     OBJECTIVE: middle aged White woman In no acute distress  Vitals:   04/02/16 0853  BP: 118/69  Pulse: 60  Resp: 20  Temp: 97.6 F (36.4 C)     Body mass index is 21.15 kg/m.    ECOG FS:1 - Symptomatic but completely ambulatory  Sclerae unicteric, pupils round and equal Oropharynx clear and moist-- no thrush or other lesions No cervical or supraclavicular adenopathy Lungs no rales or rhonchi Heart regular rate and rhythm Abd soft, nontender, positive bowel sounds MSK no focal spinal tenderness, no upper extremity lymphedema Neuro: nonfocal, well oriented, appropriate affect Breasts: Deferred Skin: The port site metastases do appear slightly smaller. There imaged below   Photo of right lower quadrant port-site metastases 03/15/2016    Photo 04/02/2016    LAB RESULTS:  CMP     Component Value Date/Time   NA 140 03/15/2016 1236   K 3.7 03/15/2016 1236   CL 104 06/18/2015 1248   CO2 27 03/15/2016 1236   GLUCOSE 96 03/15/2016 1236   BUN 8.7 03/15/2016 1236   CREATININE 0.7 03/15/2016 1236   CALCIUM 9.8 03/15/2016 1236   PROT 7.5 03/15/2016 1236   ALBUMIN 4.5 03/15/2016 1236   AST 20 03/15/2016 1236   ALT 19 03/15/2016 1236   ALKPHOS 53 03/15/2016 1236   BILITOT 0.57 03/15/2016 1236   GFRNONAA >60 06/18/2015 1248   GFRAA >60 06/18/2015 1248    INo results found for: SPEP, UPEP  Lab Results  Component Value Date   WBC 3.9 04/02/2016   NEUTROABS 1.8 04/02/2016   HGB 13.1 04/02/2016   HCT 37.8 04/02/2016   MCV 93.1 04/02/2016   PLT 260 04/02/2016      Chemistry      Component Value Date/Time   NA 140 03/15/2016 1236   K 3.7 03/15/2016 1236   CL 104 06/18/2015 1248   CO2 27 03/15/2016 1236    BUN 8.7 03/15/2016 1236   CREATININE 0.7 03/15/2016 1236      Component Value Date/Time   CALCIUM 9.8 03/15/2016 1236   ALKPHOS 53 03/15/2016 1236   AST 20 03/15/2016 1236   ALT 19 03/15/2016 1236   BILITOT 0.57 03/15/2016 1236       No results found for: LABCA2  No components found for: [id number]  No results for input(s): INR in the last 168 hours.  Urinalysis    Component Value Date/Time   COLORURINE YELLOW 03/29/2015 0920   APPEARANCEUR CLEAR 03/29/2015 0920   LABSPEC 1.005 10/09/2015 1239   PHURINE 6.0 10/09/2015 1239   PHURINE 7.5 03/29/2015 0920   GLUCOSEU Negative 10/09/2015 1239   HGBUR Negative 10/09/2015 1239   HGBUR SMALL (A) 03/29/2015 0920   BILIRUBINUR Negative 10/09/2015 1239   KETONESUR Negative 10/09/2015 1239   KETONESUR NEGATIVE 03/29/2015 0920   PROTEINUR Negative 10/09/2015 1239   PROTEINUR NEGATIVE 03/29/2015 0920   UROBILINOGEN 0.2 10/09/2015 1239   NITRITE Negative 10/09/2015 1239   NITRITE NEGATIVE 03/29/2015 0920   LEUKOCYTESUR Negative 10/09/2015 1239     STUDIES: US Abdomen Limited  Result Date: 03/11/2016 CLINICAL DATA:  Palpable masses in right lower quadrant of abdomen. EXAM: LIMITED ABDOMINAL ULTRASOUND COMPARISON:  CT scan of January 15, 2016. FINDINGS: Limited sonographic evaluation in area of palpable concern in right lower quadrant of abdomen demonstrates complex abnormality with cystic component measuring 3.4 x 2.2 x 0.8 cm. Another complex abnormality measuring 3.2  x 2.6 x 1.0 cm is noted just inferior to the other abnormality. It is uncertain if these abnormalities represent possible hernias or masses. IMPRESSION: Two complex abnormalities are noted in the area of palpable concern in right lower quadrant of abdomen. Is uncertain if these represent solid masses or potentially hernias. CT scan is recommended for further evaluation. Electronically Signed   By: Marijo Conception, M.D.   On: 03/11/2016 08:28   Mm Screening Breast Tomo  Bilateral  Result Date: 03/26/2016 CLINICAL DATA:  Screening. EXAM: 2D DIGITAL SCREENING BILATERAL MAMMOGRAM WITH CAD AND ADJUNCT TOMO COMPARISON:  Previous exam(s). ACR Breast Density Category c: The breast tissue is heterogeneously dense, which may obscure small masses. FINDINGS: There are no findings suspicious for malignancy. Images were processed with CAD. IMPRESSION: No mammographic evidence of malignancy. A result letter of this screening mammogram will be mailed directly to the patient. RECOMMENDATION: Screening mammogram in one year. (Code:[id number]-B-01Y) BI-RADS CATEGORY  1: Negative. Electronically Signed   By: Evangeline Dakin M.D.   On: 03/26/2016 07:41    ELIGIBLE FOR AVAILABLE RESEARCH PROTOCOL: no  ASSESSMENT: 51 y.o. [place] woman status post radical tumor debulking with optimal cytoreduction (R0) 03/27/2015 for a stage IIIC, high-grade right fallopian tube carcinoma  (a) baseline CA-125 was1226.  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)  (1) adjuvant chemotherapy consisted of carboplatin and paclitaxel for 6 doses, begun 04/14/2015, completed 08/11/2015  (a) paclitaxel was omitted from cycle 5 and dose reduced on cycle 6 because of neuropathy   (b) a "make up" dose of paclitaxel was given 08/25/2015  (c) last carboplatin dose was 08/11/2015  (d) CA-125 normalized by 06/16/2015   (2) FIRST RECURRENCE: January 2018  (a) CA-125 rise beginning November 2017 led to CT scans and PET scans December 2017, all negative  (b) exploratory laparotomy 02/24/2016 showed pathologically confirmed recurrence, with miliary disease involving all examined surfaces  (c) 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  (a) niraparib or olaparib to follow as  maintenance  PLAN: Stanton Kidney is showing an early response to her chemotherapy in terms of significant drop in the CA 125 and also some decrease in the size of her port site metastases. This is very favorable.  She is tolerating treatment remarkably well and feels her hair is actually growing. I think the bladder symptoms she reports are related to the miliary deposits of tumor on the bladder surface. Hopefully this will improve with time.  As far as the constipation is concerned I repeated my suggestion that she take stool softeners 2 tablets twice daily and MiraLAX daily until she has a bowel movement.  I'm concerned about the numbness and tingling that she reports in her feet and shins. However this is not constant. I would expect neuropathy to be more persistent. We may be talking about irritation rather than damage. This will need to be followed closely.  We are proceeding with the second cycle today. She will have lab work in 2 weeks. She really likes the idea of rechecking the CA-125 at that time and I am glad to facilitate that for her. Assuming no other complications she will see me again in 4 weeks for cycle #3   Kathryn Ramsey   04/02/2016 9:15 AM Medical Oncology and Hematology [facility] [place], [place] [address]  Tel. [phone]    Fax. [phone]

## 2016-04-03 LAB — CA 125: Cancer Antigen (CA) 125: 122.7 U/mL — ABNORMAL HIGH (ref 0.0–38.1)

## 2016-04-07 ENCOUNTER — Other Ambulatory Visit: Payer: Self-pay | Admitting: Emergency Medicine

## 2016-04-07 DIAGNOSIS — C5701 Malignant neoplasm of right fallopian tube: Secondary | ICD-10-CM

## 2016-04-07 DIAGNOSIS — N951 Menopausal and female climacteric states: Secondary | ICD-10-CM

## 2016-04-07 DIAGNOSIS — C762 Malignant neoplasm of abdomen: Secondary | ICD-10-CM

## 2016-04-08 ENCOUNTER — Ambulatory Visit: Payer: 59 | Attending: Oncology | Admitting: Physical Therapy

## 2016-04-08 ENCOUNTER — Encounter: Payer: Self-pay | Admitting: Physical Therapy

## 2016-04-08 DIAGNOSIS — M62838 Other muscle spasm: Secondary | ICD-10-CM | POA: Insufficient documentation

## 2016-04-08 DIAGNOSIS — M6281 Muscle weakness (generalized): Secondary | ICD-10-CM | POA: Insufficient documentation

## 2016-04-08 NOTE — Patient Instructions (Addendum)
Shelly Prosko is the physical therapy that does yoga look on you tube  [facility]  Yoga  In You Tube  About Abdominal Massage  Abdominal massage, also called external colon massage, is a self-treatment circular massage technique that can reduce and eliminate gas and ease constipation. The colon naturally contracts in waves in a clockwise direction starting from inside the right hip, moving up toward the ribs, across the belly, and down inside the left hip.  When you perform circular abdominal massage, you help stimulate your colon's normal wave pattern of movement called peristalsis.  It is most beneficial when done after eating.  Positioning You can practice abdominal massage with oil while lying down, or in the shower with soap.  Some people find that it is just as effective to do the massage through clothing while sitting or standing.  How to Massage Start by placing your finger tips or knuckles on your right side, just inside your hip bone.  . Make small circular movements while you move upward toward your rib cage.   . Once you reach the bottom right side of your rib cage, take your circular movements across to the left side of the bottom of your rib cage.  . Next, move downward until you reach the inside of your left hip bone.  This is the path your feces travel in your colon. . Continue to perform your abdominal massage in this pattern for 10 minutes each day.     You can apply as much pressure as is comfortable in your massage.  Start gently and build pressure as you continue to practice.  Notice any areas of pain as you massage; areas of slight pain may be relieved as you massage, but if you have areas of significant or intense pain, consult with your healthcare provider.  Other Considerations . General physical activity including bending and stretching can have a beneficial massage-like effect on the colon.  Deep breathing can also stimulate the colon because breathing deeply  activates the same nervous system that supplies the colon.   . Abdominal massage should always be used in combination with a bowel-conscious diet that is high in the proper type of fiber for you, fluids (primarily water), and a regular exercise program.  Toileting Techniques for Bowel Movements (Defecation) Using your belly (abdomen) and pelvic floor muscles to have a bowel movement is usually instinctive.  Sometimes people can have problems with these muscles and have to relearn proper defecation (emptying) techniques.  If you have weakness in your muscles, organs that are falling out, decreased sensation in your pelvis, or ignore your urge to go, you may find yourself straining to have a bowel movement.  You are straining if you are: . holding your breath or taking in a huge gulp of air and holding it  . keeping your lips and jaw tensed and closed tightly . turning red in the face because of excessive pushing or forcing . developing or worsening your  hemorrhoids . getting faint while pushing . not emptying completely and have to defecate many times a day  If you are straining, you are actually making it harder for yourself to have a bowel movement.  Many people find they are pulling up with the pelvic floor muscles and closing off instead of opening the anus. Due to lack pelvic floor relaxation and coordination the abdominal muscles, one has to work harder to push the feces out.  Many people have never been taught how to defecate  efficiently and effectively.  Notice what happens to your body when you are having a bowel movement.  While you are sitting on the toilet pay attention to the following areas: . Jaw and mouth position . Angle of your hips   . Whether your feet touch the ground or not . Arm placement  . Spine position . Waist . Belly tension . Anus (opening of the anal canal)  An Evacuation/Defecation Plan   Here are the 4 basic points:  1. Lean forward enough for your elbows to  rest on your knees 2. Support your feet on the floor or use a low stool if your feet don't touch the floor  3. Push out your belly as if you have swallowed a beach ball-you should feel a widening of your waist 4. Open and relax your pelvic floor muscles, rather than tightening around the anus      The following conditions my require modifications to your toileting posture:  . If you have had surgery in the past that limits your back, hip, pelvic, knee or ankle flexibility . Constipation   Your healthcare practitioner may make the following additional suggestions and adjustments:  1) Sit on the toilet  a) Make sure your feet are supported. b) Notice your hip angle and spine position-most people find it effective to lean forward or raise their knees, which can help the muscles around the anus to relax  c) When you lean forward, place your forearms on your thighs for support  2) Relax suggestions a) Breath deeply in through your nose and out slowly through your mouth as if you are smelling the flowers and blowing out the candles. b) To become aware of how to relax your muscles, contracting and releasing muscles can be helpful.  Pull your pelvic floor muscles in tightly by using the image of holding back gas, or closing around the anus (visualize making a circle smaller) and lifting the anus up and in.  Then release the muscles and your anus should drop down and feel open. Repeat 5 times ending with the feeling of relaxation. c) Keep your pelvic floor muscles relaxed; let your belly bulge out. d) The digestive tract starts at the mouth and ends at the anal opening, so be sure to relax both ends of the tube.  Place your tongue on the roof of your mouth with your teeth separated.  This helps relax your mouth and will help to relax the anus at the same time.  3) Empty (defecation) a) Keep your pelvic floor and sphincter relaxed, then bulge your anal muscles.  Make the anal opening wide.   b) Stick your belly out as if you have swallowed a beach ball. c) Make your belly wall hard using your belly muscles while continuing to breathe. Doing this makes it easier to open your anus. d) Breath out and give a grunt (or try using other sounds such as ahhhh, shhhhh, ohhhh or grrrrrrr).  4) Finish a) As you finish your bowel movement, pull the pelvic floor muscles up and in.  This will leave your anus in the proper place rather than remaining pushed out and down. If you leave your anus pushed out and down, it will start to feel as though that is normal and give you incorrect signals about needing to have a bowel movement.    Earlie Counts, PT [facility] [address] [place], [place] [address] Scar Massage  Scar massage is done to improve  the mobility of scar, decrease scar tissue from building up, reduce adhesions, and prevent Keloids from forming. Start scar massage after scabs have fallen off by themselves and no open areas. The first few weeks after surgery, it is normal for a scar to appear pink or red and slightly raised. Scars can itch or have areas of numbness. Some scars may be sensitive.   Direct Scar massage: after scar is healed, no opening, no scab 1.  Place pads of two fingers together directly on the scar starting at one end of the scar. Move the fingers up and down across the scar holding 5 seconds one direction.  Then go opposite direction hold 5 seconds.  2. Move over to the next section of the scar and repeat.  Work your way along the entire length of the scar.   3. Next make diagonal movements along the scar holding 5 seconds at one direction. 4. Next movement is side to side. 5. Do not rub fingers over the scar.  Instead keep firm pressure and move scar over the tissue it is on top   Scar Lift and Roll 12 weeks after surgery. 1. Pinch a small amount of the scar between your first two fingers and thumb.  2. Roll the scar between your  fingers for 5 to 15 seconds. 3. Move along the scar and repeat until you have massaged the entire length of scar.   Stop the massage and call your doctor if you notice: 1. Increased redness 2. Bleeding from scar 3. Seepage coming from the scar 4. Scar is warmer and has increased pain

## 2016-04-08 NOTE — Therapy (Signed)
[facility] [address] [place], [place], [address] Phone: [phone]   Fax:  [phone]  Physical Therapy Evaluation  Patient Details  Name: Kathryn Ramsey MRN: [id number] Date of Birth: [birth year] Referring Provider: Dr. Lurline Del  Encounter Date: 04/08/2016      PT End of Session - 04/08/16 1326    Visit Number 1   Date for PT Re-Evaluation 08/06/16   PT Start Time [id number]   PT Stop Time 1100   PT Time Calculation (min) 45 min   Activity Tolerance Patient tolerated treatment well   Behavior During Therapy [facility] for tasks assessed/performed      Past Medical History:  Diagnosis Date  . Acute sensory neuropathy ([place]) 06/26/2015  . Allergy   . Anemia   . Asthma    illness induced asthma  . Blood transfusion without reported diagnosis    at age 51 years old D/T surgery to femur being crushed  . 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 Heart murmur    pt, states had a "working heart murmur"  . History of kidney stones   . PONV (postoperative nausea and vomiting)   . Skin cancer     Past Surgical History:  Procedure Laterality Date  . 3 laporscopic proceedures    . ABDOMINAL HYSTERECTOMY     2010  . CHOLECYSTECTOMY     2010  . DILATION AND CURETTAGE OF UTERUS     1997  . LAPAROSCOPY N/A 03/27/2015   Procedure: DIAGNOSTIC LAPAROSCOPY ;  Surgeon: [name], MD;  Location: [facility];  Service: Gynecology;  Laterality: N/A;  . LAPAROSCOPY N/A 02/24/2016   Procedure: LAPAROSCOPY DIAGNOSTIC WITH PERITONEAL WASHINGS AND PERITONEAL BIOPSY;  Surgeon: [name], MD;  Location: [facility];  Service: Gynecology;  Laterality: N/A;  . LAPAROTOMY N/A 03/27/2015   Procedure: EXPLORATORY LAPAROTOMY, BILATERAL SALPINGO OOPHORECTOMY, OMENTECTOMY, RADICAL TUMOR DEBULKING;  Surgeon: [name], MD;  Location: [facility];  Service: Gynecology;  Laterality: N/A;  .  sinus surgery 1994      There were no vitals filed for this visit.       Subjective Assessment - 04/08/16 1023    Subjective Intercourse is painful.  Very dry in the vaginal area. Very cautious with sex. I am having chemotherapy 1 time per month and has had 2 treatments.  Ultrasound said it could be masses on the lataeral abdominal scars.    Patient Stated Goals improve pain with intercourse   Currently in Pain? 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 Sarajane Jews Magrinat   Onset Date/Surgical Date 03/27/15   Prior Therapy None     Precautions   Precautions Other (comment)   Precaution Comments Cancer     Restrictions  Weight Bearing Restrictions No     Balance Screen   Has the patient fallen in the past 6 months No   Has the patient had a decrease in activity level because of a fear of falling?  No   Is the patient reluctant to leave their home because of a fear of falling?  No     Home Ecologist residence     Prior Function   Level of Independence Independent   Vocation Self employed   Leisure yoga, walks     Cognition   Overall Cognitive Status Within Functional Limits for tasks assessed     Observation/Other Assessments   Skin Integrity scars on abdomen have decreased mobility, scars on right lateral abdomina wall area raised and thick, scar along the midline of lower abdominal walll has decreased  mobility   Focus on Therapeutic Outcomes (FOTO)  FOTO score 25% limitation     Posture/Postural Control   Posture/Postural Control No significant limitations     ROM / Strength   AROM / PROM / Strength AROM;PROM;Strength     AROM   Lumbar Flexion decreased by 25%  limited motion at L4-S1   Lumbar Extension decreased by 50%   Lumbar - Right Side Bend full   Lumbar - Left Side Bend decreased by 50%     Strength   Right Hip Extension 4/5   Right Hip ABduction 3+/5   Right Hip ADduction 4/5   Left Hip Extension 4/5   Left Hip ABduction 3+/5   Left Hip ADduction 4/5     Palpation   SI assessment  pelvis in correct alignment   Palpation comment fibrous band felt from the lower right abdominal scar going to the ilium; decresaed mobility of abdominal wall     Special Tests    Special Tests Hip Special Tests   Hip Special Tests  Trendelenberg Test     Trendelenburg Test   Findings Positive   Side Left   Comments drop right hip     Transfers   Transfers Not assessed     Ambulation/Gait   Ambulation/Gait No                 Pelvic Floor Special Questions - 04/08/16 0001    Prior Pregnancies Yes   Number of Pregnancies 4   Diastasis Recti yes; 2 fingers above umbilicus; 1.5 fingers below umbilicus   Currently Sexually Active Yes   Is this Painful Yes   Marinoff Scale pain interrupts completion   Urinary Leakage No   Urinary urgency No   Urinary frequency every hour to 1.5 hour   Fluid intake 64 ounces water   Skin Integrity Other   Skin Integrity other dryness   Perineal Body/Introitus  Elevated   Pelvic Floor Internal Exam Patient confirmed identification and approves PT to assess muscle integrity   Exam Type Vaginal   Palpation Decreased mobility of bladder and urethrapshincter; tenderness located on perineal body, walls of vulva, puborectalis, obturator internist; ischiococcygeus   Strength weak squeeze, no lift   Tone increased tone                   PT Education - 04/08/16 1333    Education provided Yes   Education Details toileting technique; abdominal massage; scar massage; you tube videos for yoga   Person(s) Educated Patient   Methods Explanation;Demonstration;Verbal cues;Handout   Comprehension Returned demonstration;Verbalized understanding  PT Short Term Goals - 04/08/16 1340      PT SHORT TERM GOAL #1   Title independent with scar massage to improve tissue mobility   Time 4   Period Weeks   Status New     PT SHORT TERM GOAL #2   Title understand how to perform abdominal massage to promote peristalic mobility to improve constipation   Time 4   Period Weeks   Status New     PT SHORT TERM GOAL #3   Title understand how to toilet correctly to reduce strain on pelvic floor and ability to relax the pelvic floor    Time 4   Period Weeks   Status New     PT SHORT TERM GOAL #4   Title ability to perfrom a pelvic floor contraction with a lift due to pelvic floor tone decreased and muscle able to fully contract   Time 4   Period Weeks   Status New     PT SHORT TERM GOAL #5   Title understand how to perform self perineal massage to elongate pelvic floor muscles for relaxation   Time 4   Period Weeks   Status New           PT Long Term Goals - 04/08/16 1335      PT LONG TERM GOAL #1   Title independent with HEP   Time 4   Period Months   Status New     PT LONG TERM GOAL #2   Title reduction of bladder pressure when urinating >/= 80% due to increased mobility of bladder and abdominal tissue   Time 4   Period Months   Status New     PT LONG TERM GOAL #3   Title pain with penile penetration decreased >/= 75% so Marinoff scale is 1/3   Time 4   Period Months   Status New     PT LONG TERM GOAL #4   Title mobility of abdominal scars improved >/= 75% so patient is able to contract lower abdominals correctly with correct toileting technique   Time 4   Period Months    Status New     PT LONG TERM GOAL #5   Title understand how to relax the pelvic floor muscles during penile penetracton and vaginal exam to 2-4uv   Time 4   Period Months   Status New     Additional Long Term Goals   Additional Long Term Goals Yes               Plan - 04/08/16 1345    Clinical Impression Statement Patient is a 51 year old female with pelvic pain during vaginal exams and penile penetration.  Patient is presently having chemotherapy 1 time per month due to right fallopian tube cancer. Patient was diagnosised on 03/26/2016. Patient reports 10/10 pain with penile penetration.  She has increased tone of pelvic floor with tenderness located in the pelvic floor muscles and introitus.  Vaginal area is dry making intercourse painful.  Abdominal scars have decreased mobility and abdominal tissue has fascial restrictions.  Patient is weak with bil. hip abduction, adduction and extension.  Vaginal strength is 2/5 with no lift. All pelvic floor muscles were tender and decreased mobilty of bladder and bilateral urethra sphincter. Lumbar ROM is limited due to decreased abdominal tissue  and decreased mobility at L4-S1. Patient is moderately complex evaluation due to and evolving condition and comorbdities such as fallopian tube cancer, perotineal cancer,  multiple abdominal scars  from surgeries Patient will benefit from skilled therapy to reduce pain, increased pelvic floor mobility, increased strength.    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; presently receiving chemotherapy 1x/month;    PT Frequency 2x / week   PT Duration Other (comment)  4 months   PT Treatment/Interventions Biofeedback;Moist Heat;Therapeutic activities;Therapeutic exercise;Neuromuscular re-education;Patient/family education;Scar mobilization;Manual techniques;Dry needling;Manual lymph drainage;Taping;Cryotherapy;Electrical Stimulation   PT  Next Visit Plan abdominal soft tissue work and myofascial release; soft tissue work to pelvic floor area; joint mobilization to L4-S1; diaphragmatic breathing; hip stretches   PT Home Exercise Plan hip stretches   Recommended Other Services None   Consulted and Agree with Plan of Care Patient      Patient will benefit from skilled therapeutic intervention in order to improve the following deficits and impairments:  Pain, Decreased mobility, Decreased strength, Decreased activity tolerance, Decreased coordination, Decreased range of motion, Increased fascial restricitons, Impaired tone, Increased muscle spasms, Decreased scar mobility  Visit Diagnosis: Muscle weakness (generalized) - Plan: PT plan of care cert/re-cert  Other muscle spasm - Plan: PT plan of care cert/re-cert     Problem List Patient Active Problem List   Diagnosis Date Noted  . Lichen sclerosus et atrophicus of the vulva 03/31/2016  . Abdominal wall mass of right lower quadrant 03/08/2016  . 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 Goals of care, counseling/discussion 02/27/2016  . Elevated CA-125 02/24/2016  . Vaginal dryness, menopausal 10/01/2015  . Drug-induced peripheral neuropathy ([place]) 09/02/2015  . Leukopenia due to antineoplastic chemotherapy ([place]) 09/02/2015  . Chemotherapy-induced neuropathy ([place]) 08/19/2015  . Cellulitis 07/03/2015  . Acute sensory neuropathy ([place]) 06/26/2015  . High risk medication use 06/21/2015  . Facial paresthesia 06/18/2015  . Genetic testing 06/16/2015  . Restless legs 05/14/2015  . Family history of breast cancer in female 04/29/2015  . Chemotherapy-induced nausea 04/22/2015  . Chemotherapy induced neutropenia ([place]) 04/22/2015  . 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 History of hysterectomy for benign disease 03/21/2015  . IBS (irritable bowel syndrome) 03/21/2015  . Dense breast tissue 03/21/2015  . History of cholecystectomy 03/21/2015  . Abdominal  carcinomatosis ([place]) 03/21/2015    Earlie Counts, PT 04/08/16 2:05 PM   [facility] [address], [place], [place], [address] Phone: [phone]   Fax:  [phone]  Name: Laverna Fanous MRN: [id number] Date of Birth: 1965/06/23

## 2016-04-14 ENCOUNTER — Encounter: Payer: Self-pay | Admitting: Physical Therapy

## 2016-04-14 ENCOUNTER — Ambulatory Visit: Payer: 59 | Admitting: Physical Therapy

## 2016-04-14 DIAGNOSIS — M62838 Other muscle spasm: Secondary | ICD-10-CM

## 2016-04-14 DIAGNOSIS — M6281 Muscle weakness (generalized): Secondary | ICD-10-CM

## 2016-04-14 NOTE — Patient Instructions (Addendum)
Walk Taller    Pretend that you are 2-4 inches taller than you think or have been told you are. "Attach" a string, rope, golden thread, or steel cable to the top of your head to help pull you up. Pretend that you are a puppet being held up by this string.   Copyright  VHI. All rights reserved.  Long Legs    Pretend that your legs start at your waist, IN THE BACK. Use back buttock and upper thigh muscles to push you forward.   Copyright  VHI. All rights reserved.  Feather Feet    Walk with "Feather Feet". Each foot comes down lightly on the ground.   Copyright  VHI. All rights reserved.    Bring both knees up towards your chest as shown in the picture.  Place your Right hand on top of your left thigh, and your right hand on the back of your Left thigh.  Push your legs into each hand with about 50% effort.  Hold for 5 seconds. Repeat. When feel out of place.  Moisturizers . They are used in the vagina to hydrate the mucous membrane that make up the vaginal canal. . Designed to keep a more normal acid balance (ph) . Once placed in the vagina, it will last between two to three days.  . Use 2-3 times per week at bedtime and last longer than 60 min. . Ingredients to avoid is glycerin and fragrance, can increase chance of infection . Should not be used just before sex due to causing irritation . Most are gels administered either in a tampon-shaped applicator or as a vaginal suppository. They are non-hormonal.   Types of Moisturizers . Replens- drug store . Samul Dada- drug store . Vitamin E vaginal suppositories- Whole foods . Moist Again . Coconut oil- can break down condoms . [employer] . [place] has lidocaine in it . V-magic Moisturizer good for cancer patients. . Yes moisturizer- amazon  Things to avoid in the vaginal area . Do not use things to irritate the vulvar area . No lotions . No soaps; can use Aveeno or Calendula cleanser if needed.  Must be gentle . No deodorants . No douches . Good to sleep without underwear to let the vaginal area to air out . No scrubbing: spread the lips to let warm water rinse over labias and pat dry    Lubrication . Used for intercourse to reduce friction . Avoid ones that have glycerin, warming gels, tingling gels, icing or cooling gel, scented . Avoid parabens due to a preservative similar to female sex hormone . May need to be reapplied once or several times during sexual activity . Can be applied to both partners genitals prior to vaginal penetration to minimize friction or irritation . Prevent irritation and mucosal tears that cause post coital pain and increased the risk of vaginal and urinary tract infections . Oil-based lubricants cannot be used with condoms due to breaking them down.  Least likely to irritate vaginal tissue.  . Plant based-lubes are safe . Silicone-based lubrication are thicker and last long and used for post-menopausal women  Vaginal Lubricators Here is a list of some suggested lubricators you can use for intercourse. Use the most hypoallergenic product.  You can place on you or your partner.   Astroglide natural in green box  K-Y Intrique- silicone base  Good Clean Love -[employer]S,[employer], [employer], [employer] (water based)  Slippery Stuff  Sylk, Sliquid Natural H2O ( good  if frequent UTI's)  Blossom Organics ([email or web address]-organics.com)  Samul Dada, Coconut oil  PJur Woman Nude- water based lubricant, [place], good for cancer patients with radiation  Yes lubricant- [place] . Wet Platinum-Silicone, [employer], [employer] Things to avoid in lubricants are glycerin, warming gels, tingling gels, icing or cooling  gels, and scented gels.  Also avoid Vaseline. KY jelly, Replens, and Astroglide kills good bacteria(lactobacilli)  Things to avoid in the vaginal area . Do not use things to irritate the vulvar area . No  lotions . No soaps; can use Aveeno or Calendula cleanser if needed. Must be gentle . No deodorants . No douches . Good to sleep without underwear to let the vaginal area to air out . No scrubbing: spread the lips to let warm water rinse over labias and pat dry   [facility] [address] [place], [place] [address] Phone # [phone] Fax [phone]   [facility] dilator Bryson Corona

## 2016-04-14 NOTE — Therapy (Signed)
[facility] [address], [place], [place], [address] Phone: [phone]   Fax:  [phone]  Physical Therapy Treatment  Patient Details  Name: Kathryn Ramsey MRN: [id number] Date of Birth: 15-Mar-1965 Referring Provider: Dr. Lurline Del  Encounter Date: 04/14/2016      PT End of Session - 04/14/16 1705    Visit Number 2   Date for PT Re-Evaluation 08/06/16   PT Start Time 1530   PT Stop Time 1615   PT Time Calculation (min) 45 min   Activity Tolerance Patient tolerated treatment well   Behavior During Therapy [facility] for tasks assessed/performed      Past Medical History:  Diagnosis Date  . Acute sensory neuropathy ([place]) 06/26/2015  . Allergy   . Anemia   . Asthma    illness induced asthma  . Blood transfusion without reported diagnosis    at age 23 years old D/T surgery to femur being crushed  . 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 Heart murmur    pt, states had a "working heart murmur"  . History of kidney stones   . PONV (postoperative nausea and vomiting)   . Skin cancer     Past Surgical History:  Procedure Laterality Date  . 3 laporscopic proceedures    . ABDOMINAL HYSTERECTOMY     2010  . CHOLECYSTECTOMY     2010  . DILATION AND CURETTAGE OF UTERUS     1997  . LAPAROSCOPY N/A 03/27/2015   Procedure: DIAGNOSTIC LAPAROSCOPY ;  Surgeon: [name], MD;  Location: [facility];  Service: Gynecology;  Laterality: N/A;  . LAPAROSCOPY N/A 02/24/2016   Procedure: LAPAROSCOPY DIAGNOSTIC WITH PERITONEAL WASHINGS AND PERITONEAL BIOPSY;  Surgeon: [name], MD;  Location: [facility];  Service: Gynecology;  Laterality: N/A;  . LAPAROTOMY N/A 03/27/2015   Procedure: EXPLORATORY LAPAROTOMY, BILATERAL SALPINGO OOPHORECTOMY, OMENTECTOMY, RADICAL TUMOR DEBULKING;  Surgeon: [name], MD;  Location: [facility];  Service: Gynecology;  Laterality: N/A;  .  sinus surgery 1994      There were no vitals filed for this visit.      Subjective Assessment - 04/14/16 1534    Subjective I felt a difference for several days.  I have been doing a belly massage every morning and now able to have a bowel movement. I feel like I waked my pelvis today.  I have more pressuer in the right lower abdominal area where the scar is.    Patient Stated Goals improve pain with intercourse   Currently in Pain? Yes   Pain Score 10-Worst pain ever   Pain Location Vagina   Pain Orientation Mid   Pain Descriptors / Indicators Sore;Stabbing   Pain Type Acute pain   Pain Onset 1 to 4 weeks ago   Pain Frequency Intermittent   Aggravating Factors  intercourse, vaginal exam; spandex aggravates the vaginal area   Pain Relieving Factors no intercourse   Multiple Pain Sites No            [facility] PT Assessment - 04/14/16 0001      Palpation   SI assessment  left iluim post. rotated                  Pelvic Floor Special Questions - 04/14/16 0001    Pelvic Floor Internal Exam Patient confirmed identification and approves PT to assess muscle integrity  Exam Type Vaginal           [facility] Adult PT Treatment/Exercise - 04/14/16 0001      Self-Care   Self-Care Other Self-Care Comments   Other Self-Care Comments  discussed how moisturizers and lubricants help with vaginal dryness and increase tissue extensibility; how to purchase a vaginal dilator to expand the introitus     Neuro Re-ed    Neuro Re-ed Details  walking without clenching pelvic floor muscles, instead of using her gluteal and pushing up ward with less pounding of the heels     Manual Therapy   Manual Therapy Myofascial release;Internal Pelvic Floor;Muscle Energy Technique   Myofascial Release release of the lower urogential dipahgram; release of the bladder with one hand on lower abdomen and other internally on side of bladder   Internal Pelvic Floor bil. sides of urethra and post.  intoitus   Muscle Energy Technique correct left iluim                 PT Education - 04/14/16 1703    Education provided Yes   Education Details moisturizers, lubricants, dilator, walking without clenching pelvic floor, how to correct pelvis   Person(s) Educated Patient   Methods Explanation;Demonstration;Verbal cues;Handout   Comprehension Returned demonstration;Verbalized understanding          PT Short Term Goals - 04/14/16 1708      PT SHORT TERM GOAL #1   Title independent with scar massage to improve tissue mobility   Time 4   Period Weeks   Status Achieved     PT SHORT TERM GOAL #2   Title understand how to perform abdominal massage to promote peristalic mobility to improve constipation   Time 4   Period Weeks   Status Achieved     PT SHORT TERM GOAL #3   Title understand how to toilet correctly to reduce strain on pelvic floor and ability to relax the pelvic floor    Time 4   Period Weeks   Status Achieved     PT SHORT TERM GOAL #4   Title ability to perfrom a pelvic floor contraction with a lift due to pelvic floor tone decreased and muscle able to fully contract   Time 4   Period Weeks   Status New     PT SHORT TERM GOAL #5   Title understand how to perform self perineal massage to elongate pelvic floor muscles for relaxation   Time 4   Period Weeks   Status New           PT Long Term Goals - 04/08/16 1335      PT LONG TERM GOAL #1   Title independent with HEP   Time 4   Period Months   Status New     PT LONG TERM GOAL #2   Title reduction of bladder pressure when urinating >/= 80% due to increased mobility of bladder and abdominal tissue   Time 4   Period Months   Status New     PT LONG TERM GOAL #3   Title pain with penile penetration decreased >/= 75% so Marinoff scale is 1/3   Time 4   Period Months   Status New     PT LONG TERM GOAL #4   Title mobility of abdominal scars improved >/= 75% so patient is able to contract  lower abdominals correctly with correct toileting technique   Time 4   Period Months   Status New  PT LONG TERM GOAL #5   Title understand how to relax the pelvic floor muscles during penile penetracton and vaginal exam to 2-4uv   Time 4   Period Months   Status New     Additional Long Term Goals   Additional Long Term Goals Yes               Plan - 04/14/16 1705    Clinical Impression Statement Patient reported less irritation to the bladder after last visit and lasted for 4-5 days.  Patient has had daily bowel movements using the abdominal massage.  Pelvis in correct alignment after therapy.  Patient understands how to use moisturizers and lubricants to reduce vaginal dryness and irritation. Less tightness in the post. introitus. Patient will benefit from skilled therapy to reduce pain, increase pelvic floor mobility, and increase strength.    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; presently receiving chemotherapy 1x/month;    PT Frequency 2x / week   PT Duration Other (comment)  4 months   PT Treatment/Interventions Biofeedback;Moist Heat;Therapeutic activities;Therapeutic exercise;Neuromuscular re-education;Patient/family education;Scar mobilization;Manual techniques;Dry needling;Manual lymph drainage;Taping;Cryotherapy;Electrical Stimulation   PT Next Visit Plan soft tissue work to pelvic floor area; joint mobilization to L4-S1; diaphragmatic breathing; hip stretches   PT Home Exercise Plan hip stretches   Consulted and Agree with Plan of Care Patient      Patient will benefit from skilled therapeutic intervention in order to improve the following deficits and impairments:  Pain, Decreased mobility, Decreased strength, Decreased activity tolerance, Decreased coordination, Decreased range of motion, Increased fascial restricitons, Impaired tone, Increased muscle spasms, Decreased scar  mobility  Visit Diagnosis: Other muscle spasm  Muscle weakness (generalized)     Problem List Patient Active Problem List   Diagnosis Date Noted  . Lichen sclerosus et atrophicus of the vulva 03/31/2016  . Abdominal wall mass of right lower quadrant 03/08/2016  . 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 Goals of care, counseling/discussion 02/27/2016  . Elevated CA-125 02/24/2016  . Vaginal dryness, menopausal 10/01/2015  . Drug-induced peripheral neuropathy ([place]) 09/02/2015  . Leukopenia due to antineoplastic chemotherapy ([place]) 09/02/2015  . Chemotherapy-induced neuropathy ([place]) 08/19/2015  . Cellulitis 07/03/2015  . Acute sensory neuropathy ([place]) 06/26/2015  . High risk medication use 06/21/2015  . Facial paresthesia 06/18/2015  . Genetic testing 06/16/2015  . Restless legs 05/14/2015  . Family history of breast cancer in female 04/29/2015  . Chemotherapy-induced nausea 04/22/2015  . Chemotherapy induced neutropenia ([place]) 04/22/2015  . Cancer of right fallopian tube ([place]) 04/07/2015  . History of hysterectomy for benign disease 03/21/2015  . IBS (irritable bowel syndrome) 03/21/2015  . Dense breast tissue 03/21/2015  . History of cholecystectomy 03/21/2015  . 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 [place], [place], [address] Phone: [phone]   Fax:  [phone]  Name: Kathryn Ramsey MRN: [id number] Date of Birth: 08/14/1965

## 2016-04-16 ENCOUNTER — Telehealth: Payer: Self-pay | Admitting: *Deleted

## 2016-04-16 ENCOUNTER — Ambulatory Visit (HOSPITAL_BASED_OUTPATIENT_CLINIC_OR_DEPARTMENT_OTHER): Payer: 59 | Admitting: Oncology

## 2016-04-16 ENCOUNTER — Encounter: Payer: Self-pay | Admitting: Gynecologic Oncology

## 2016-04-16 ENCOUNTER — Other Ambulatory Visit (HOSPITAL_BASED_OUTPATIENT_CLINIC_OR_DEPARTMENT_OTHER): Payer: 59

## 2016-04-16 VITALS — BP 108/62 | HR 71 | Temp 97.8°F | Resp 18 | Ht 63.0 in | Wt 120.1 lb

## 2016-04-16 DIAGNOSIS — C561 Malignant neoplasm of right ovary: Secondary | ICD-10-CM | POA: Diagnosis not present

## 2016-04-16 DIAGNOSIS — C786 Secondary malignant neoplasm of retroperitoneum and peritoneum: Secondary | ICD-10-CM

## 2016-04-16 DIAGNOSIS — C762 Malignant neoplasm of abdomen: Secondary | ICD-10-CM

## 2016-04-16 DIAGNOSIS — C801 Malignant (primary) neoplasm, unspecified: Secondary | ICD-10-CM

## 2016-04-16 DIAGNOSIS — C5701 Malignant neoplasm of right fallopian tube: Secondary | ICD-10-CM

## 2016-04-16 LAB — COMPREHENSIVE METABOLIC PANEL
ALT: 21 U/L (ref 0–55)
AST: 21 U/L (ref 5–34)
Albumin: 4.3 g/dL (ref 3.5–5.0)
Alkaline Phosphatase: 60 U/L (ref 40–150)
Anion Gap: 9 mEq/L (ref 3–11)
BUN: 9.5 mg/dL (ref 7.0–26.0)
CO2: 26 mEq/L (ref 22–29)
Calcium: 9.6 mg/dL (ref 8.4–10.4)
Chloride: 101 mEq/L (ref 98–109)
Creatinine: 0.7 mg/dL (ref 0.6–1.1)
EGFR: >90 mL/min/{1.73_m2} (ref >90)
Glucose: 97 mg/dl (ref 70–140)
Potassium: 4.3 mEq/L (ref 3.5–5.1)
Sodium: 136 mEq/L (ref 136–145)
Total Bilirubin: 0.6 mg/dL (ref 0.20–1.20)
Total Protein: 7.3 g/dL (ref 6.4–8.3)

## 2016-04-16 LAB — CBC WITH DIFFERENTIAL/PLATELET
BASO%: 0.4 % (ref 0.0–2.0)
Basophils Absolute: 0 10*3/uL (ref 0.0–0.1)
EOS%: 0.2 % (ref 0.0–7.0)
Eosinophils Absolute: 0 10*3/uL (ref 0.0–0.5)
HCT: 34.4 % — ABNORMAL LOW (ref 34.8–46.6)
HGB: 11.8 g/dL (ref 11.6–15.9)
LYMPH%: 42.9 % (ref 14.0–49.7)
MCH: 31.9 pg (ref 25.1–34.0)
MCHC: 34.2 g/dL (ref 31.5–36.0)
MCV: 93.3 fL (ref 79.5–101.0)
MONO#: 0.3 10*3/uL (ref 0.1–0.9)
MONO%: 8 % (ref 0.0–14.0)
NEUT#: 1.6 10*3/uL (ref 1.5–6.5)
NEUT%: 48.5 % (ref 38.4–76.8)
Platelets: 158 10*3/uL (ref 145–400)
RBC: 3.69 10*6/uL — ABNORMAL LOW (ref 3.70–5.45)
RDW: 13.8 % (ref 11.2–14.5)
WBC: 3.3 10*3/uL — ABNORMAL LOW (ref 3.9–10.3)
lymph#: 1.4 10*3/uL (ref 0.9–3.3)

## 2016-04-16 NOTE — Progress Notes (Signed)
[place]  [phone] [phone]     ID: Kathryn Ramsey DOB: 04-Sep-1965  MR#: [id number]  AJO#:[id number]  Patient Care Team: Harlan Stains, MD as PCP - General (Family Medicine) Gordy Levan, MD as Consulting Physician (Oncology) [name], MD as Consulting Physician (Obstetrics and Gynecology) Juanita Craver, MD as Consulting Physician (Gastroenterology) Servando Salina, MD as Consulting Physician (Obstetrics and Gynecology) Nickie Retort, MD as Consulting Physician (Urology) Chauncey Cruel, MD OTHER MD:  CHIEF COMPLAINT: Recurrent ovarian cancer  CURRENT TREATMENT: Carboplatin, liposomal doxorubicin   HISTORY OF PRESENT ILLNESS: From Dr. Serita Grit original intake note 03/17/2015:  "Kathryn Ramsey is a very pleasant G4P4 who is seen in consultation at the request of Dr Collene Mares and Dr Garwin Brothers for peritoneal carcinomatosis. The patient has a history of a workup by urology for gross hematuria which included a pelvic examine was suggested for extrinsic mass effect on the bladder on cystoscopy. Cystoscopy took place on in December 2016. The patient denies abdominal pain bloating early satiety or abdominal distention.  On 08/03/2015 at [facility] she underwent a CT scan of the abdomen and pelvis as ordered by Dr Baruch Gouty. This revealed a 1.4 cm low attenuation lesion on the posterior right hepatic lobe suggestive of a hemangioma. Status post hysterectomy. No ovarian masses. Moderate ascites. Omental caking seen in the lateral left abdomen and pelvis. Peritoneal nodularity in the left lateral pelvic cul-de-sac. No gross extrinsic compression on the bladder was identified on imaging.  The patient was then seen by her gastroenterologist, Dr. Collene Mares, who performed a colonoscopy which was unremarkable.  Tumor markers were drawn on 08/04/2015 and these included a CA-125 that was elevated to 957, and a CEA that was normal at 1."  On 03/27/2015 the  patient underwent diagnostic laparoscopy, exploratory laparotomy with bilateral salpingo-oophorectomy, omentectomy, and radical tumor debulking with optimal cytoreduction (R0) of what proved to be a stage IIIc right fallopian tube cancer. She was treated adjuvantly with carboplatin and paclitaxel, as detailed below. Her CA 125 dropped from 1226 on 03/20/2015 to 26.1 by 06/16/2015.  Her subsequent history is as detailed below.  INTERVAL HISTORY: [name] returns today for an unscheduled visit. She received her second cycle of carboplatin and liposomal doxorubicin 04/02/2016. She did well with it, but in the last few days she has noted some changes associated with her port site metastases. They look flatter but they feel somehow bigger "underneath". They feel tighter. They don't hurt even when she stretches although she can feel the pulling. This alarmed her and she came in for further evaluation today.  REVIEW OF SYSTEMS: She feels "depleted" although she tries to have as norma and activity level as possible. She is greatly benefiting from physical therapy. She had an upper respiratory infection which has cleared. She never developed a fever or purulence. She continues to be constipated. She tells me she is taking magnesium, probiotics, and stool softeners but her bowel movements are still hard. She has not lost her hair. Aside from all these issues a detailed review of systems today was stable  PAST MEDICAL HISTORY: Past Medical History:  Diagnosis Date  . Acute sensory neuropathy ([place]) 06/26/2015  . Allergy   . Anemia   . Asthma    illness induced asthma  . Blood transfusion without reported diagnosis    at age 89 years old D/T surgery to femur being crushed  . Complication of anesthesia    hx. of allergic to ether (had surgery at 5  years and had reaction to the ether  . Heart murmur    pt, states had a "working heart murmur"  . History of kidney stones   . PONV (postoperative nausea and  vomiting)   . Skin cancer     PAST SURGICAL HISTORY: Past Surgical History:  Procedure Laterality Date  . 3 laporscopic proceedures    . ABDOMINAL HYSTERECTOMY     2010  . CHOLECYSTECTOMY     2010  . DILATION AND CURETTAGE OF UTERUS     1997  . LAPAROSCOPY N/A 03/27/2015   Procedure: DIAGNOSTIC LAPAROSCOPY ;  Surgeon: [name], MD;  Location: [facility];  Service: Gynecology;  Laterality: N/A;  . LAPAROSCOPY N/A 02/24/2016   Procedure: LAPAROSCOPY DIAGNOSTIC WITH PERITONEAL WASHINGS AND PERITONEAL BIOPSY;  Surgeon: [name], MD;  Location: [facility];  Service: Gynecology;  Laterality: N/A;  . LAPAROTOMY N/A 03/27/2015   Procedure: EXPLORATORY LAPAROTOMY, BILATERAL SALPINGO OOPHORECTOMY, OMENTECTOMY, RADICAL TUMOR DEBULKING;  Surgeon: [name], MD;  Location: [facility];  Service: Gynecology;  Laterality: N/A;  . sinus surgery 1994      FAMILY HISTORY Family History  Problem Relation Age of Onset  . Hypertension Father   . Skin cancer Father     nonmelanoma skin cancers in his late 43s  . Non-Hodgkin's lymphoma Maternal Aunt     dx. 27s; smoker  . Stroke Maternal Grandmother   . Other Mother     benign meningioma dx. early-mid-70s; hysterectomy in her late 53s for heavy periods - still has ovaries  . Other Son     one son with pre-cancerous skin findings  . Other Daughter     cysts on ovaries and hx of heavy periods  . Other Sister     hysterectomy for cysts - still has ovaries  . Heart attack Maternal Grandfather   . Heart attack Paternal Grandfather   . Renal cancer Maternal Aunt 60    smoker  . Breast cancer Maternal Aunt 78  . Other Maternal Aunt     dx. benign brain tumor (meningioma) at 64; 2nd benign brain tumor in her late 17s; hx of radical hysterectomy at age 29  . Brain cancer Other     NOS tumor  The patient's parents are both living, in their late 45s as of January 2018. The patient has one brother, 3 sisters. There is no history of ovarian cancer in the family. One  maternal aunt had breast and kidney cancer, another had non-Hodgkin's lymphoma.  GYNECOLOGIC HISTORY:  No LMP recorded. Patient has had a hysterectomy. 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 Her husband [name] is a Engineer, maintenance (IT). Their children are 35, 16, 61 and 76 y/o as of JAN 2018. The oldest works as an Electrical engineer in the [employer] in [place]. The other 3 children live in [place], the youngest currently attending [employer]. Rhe patient has no grandchildren. She attends [employer]    ADVANCED DIRECTIVES:    HEALTH MAINTENANCE: Social History  Substance Use Topics  . Smoking status: Never Smoker  . Smokeless tobacco: Never Used  . Alcohol use Yes     Comment:  3 glasses wine or beer/week     Colonoscopy:2016  PAP: s/p hyst  Bone density:   Allergies  Allergen Reactions  . Bee Venom Shortness Of Breath    swelling  . Sulfa Antibiotics Shortness Of Breath  Vomit , diarrhea , hives    Current Outpatient Prescriptions  Medication Sig Dispense Refill  . B COMPLEX VITAMINS SL Place 1 tablet under the tongue daily.    . clidinium-chlordiazePOXIDE (LIBRAX) 5-2.5 MG capsule Take 1 capsule by mouth at bedtime as needed (IBS symptoms). Reported on 08/11/2015  0  . dexamethasone (DECADRON) 4 MG tablet Take 2 tablets (8 mg total) by mouth daily. Start the day after chemotherapy for 2 days. 30 tablet 1  . dexamethasone (DECADRON) 4 MG tablet Take 2 tablets (8 mg total) by mouth daily. Start the day after chemotherapy for 2 days. 30 tablet 1  . gabapentin (NEURONTIN) 100 MG capsule Take 1 capsule (100 mg total) by mouth 3 (three) times daily. (Patient taking differently: Take 100 mg by mouth See admin instructions. Takes 179m at bedtime. Takes an additional 1025mduring the day if needed for nerve pain.) 90 capsule 2  . ibuprofen (ADVIL,MOTRIN) 200 MG tablet Take 400 mg by  mouth daily as needed for mild pain. Reported on 08/18/2015    . Lactobacillus (PROBIOTIC ACIDOPHILUS PO) Take 1 capsule by mouth every evening.    . Marland KitchenORazepam (ATIVAN) 0.5 MG tablet Take 0.5 mg by mouth at bedtime.    . Marland KitchenORazepam (ATIVAN) 0.5 MG tablet Take 1 tablet (0.5 mg total) by mouth every 6 (six) hours as needed (Nausea or vomiting). 30 tablet 0  . LORazepam (ATIVAN) 0.5 MG tablet Take 1 tablet (0.5 mg total) by mouth every 6 (six) hours as needed (Nausea or vomiting). 30 tablet 0  . Multiple Vitamin (MULTIVITAMIN WITH MINERALS) TABS tablet Take 1 tablet by mouth daily.    . ondansetron (ZOFRAN) 8 MG tablet Take 1 tablet (8 mg total) by mouth 2 (two) times daily as needed for refractory nausea / vomiting. Start on day 3 after chemo. 30 tablet 1  . ondansetron (ZOFRAN) 8 MG tablet Take 1 tablet (8 mg total) by mouth 2 (two) times daily as needed for refractory nausea / vomiting. Start on day 3 after chemo. 30 tablet 1  . oxyCODONE (OXY IR/ROXICODONE) 5 MG immediate release tablet Take 1 tablet (5 mg total) by mouth once as needed (for pain score of 1-4). 30 tablet 0  . prochlorperazine (COMPAZINE) 10 MG tablet Take 1 tablet (10 mg total) by mouth every 6 (six) hours as needed (Nausea or vomiting). 30 tablet 1  . prochlorperazine (COMPAZINE) 10 MG tablet Take 1 tablet (10 mg total) by mouth every 6 (six) hours as needed (Nausea or vomiting). 30 tablet 1  . senna (SENOKOT) 8.6 MG TABS tablet Take 1 tablet (8.6 mg total) by mouth at bedtime. 120 each 0  . TURMERIC PO Take 1 tablet by mouth daily.     No current facility-administered medications for this visit.     OBJECTIVE: middle aged White woman Who appears stated age Vi37  04/16/16 1115  BP: 108/62  Pulse: 71  Resp: 18  Temp: 97.8 F (36.6 C)     Body mass index is 21.27 kg/m.    ECOG FS:1 - Symptomatic but completely ambulatory  Sclerae unicteric, EOMs intact Oropharynx clear and moist No cervical or supraclavicular  adenopathy Lungs no rales or rhonchi Heart regular rate and rhythm Abd soft, nontender, positive bowel sounds MSK no focal spinal tenderness, no upper extremity lymphedema Neuro: nonfocal, well oriented, appropriate affect Breasts: Deferred Skin: The port site metastases are flatter. I do feel particularly in the right lower quadrant 1 an area of induration  measuring approximately 3 cm, somewhat elongated, not associated with any tenderness or erythema or swelling. Similar areas of induration though smaller can be noted associated with the other port site metastases   Photo of right lower quadrant port-site metastases 03/15/2016    Photo 04/02/2016    LAB RESULTS:  CMP     Component Value Date/Time   NA 137 04/02/2016 0839   K 4.1 04/02/2016 0839   CL 104 06/18/2015 1248   CO2 27 04/02/2016 0839   GLUCOSE 73 04/02/2016 0839   BUN 13.0 04/02/2016 0839   CREATININE 0.7 04/02/2016 0839   CALCIUM 10.2 04/02/2016 0839   PROT 8.1 04/02/2016 0839   ALBUMIN 4.7 04/02/2016 0839   AST 19 04/02/2016 0839   ALT 17 04/02/2016 0839   ALKPHOS 60 04/02/2016 0839   BILITOT 0.50 04/02/2016 0839   GFRNONAA >60 06/18/2015 1248   GFRAA >60 06/18/2015 1248    INo results found for: SPEP, UPEP  Lab Results  Component Value Date   WBC 3.3 (L) 04/16/2016   NEUTROABS 1.6 04/16/2016   HGB 11.8 04/16/2016   HCT 34.4 (L) 04/16/2016   MCV 93.3 04/16/2016   PLT 158 04/16/2016      Chemistry      Component Value Date/Time   NA 137 04/02/2016 0839   K 4.1 04/02/2016 0839   CL 104 06/18/2015 1248   CO2 27 04/02/2016 0839   BUN 13.0 04/02/2016 0839   CREATININE 0.7 04/02/2016 0839      Component Value Date/Time   CALCIUM 10.2 04/02/2016 0839   ALKPHOS 60 04/02/2016 0839   AST 19 04/02/2016 0839   ALT 17 04/02/2016 0839   BILITOT 0.50 04/02/2016 0839     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    Ref Range & Units 2wk ago (04/02/16) 31moago (03/15/16) 113mogo (03/05/16) 77m75mogo (02/13/16)   Cancer Antigen (CA) 125 0.0 - 38.1 U/mL 122.7   451.0CM   577.6CM   913.9CM    Comments: [facility]          No results found for: LABCA2  No components found for: LABCA125  No results for input(s): INR in the last 168 hours.  Urinalysis    Component Value Date/Time   COLORURINE YELLOW 03/29/2015 0920   APPEARANCEUR CLEAR 03/29/2015 0920   LABSPEC 1.005 10/09/2015 1239   PHURINE 6.0 10/09/2015 1239   PHURINE 7.5 03/29/2015 0920   GLUCOSEU Negative 10/09/2015 1239   HGBUR Negative 10/09/2015 1239   HGBUR SMALL (A) 03/29/2015 0920   BILIRUBINUR Negative 10/09/2015 1239   KETONESUR Negative 10/09/2015 1239   KETONESUR NEGATIVE 03/29/2015 0920   PROTEINUR Negative 10/09/2015 1239   PROTEINUR NEGATIVE 03/29/2015 0920   UROBILINOGEN 0.2 10/09/2015 1239   NITRITE Negative 10/09/2015 1239   NITRITE NEGATIVE 03/29/2015 0920   LEUKOCYTESUR Negative 10/09/2015 1239     STUDIES: Mm Screening Breast Tomo Bilateral  Result Date: 03/26/2016 CLINICAL DATA:  Screening. EXAM: 2D DIGITAL SCREENING BILATERAL MAMMOGRAM WITH CAD AND ADJUNCT TOMO COMPARISON:  Previous exam(s). ACR Breast Density Category c: The breast tissue is heterogeneously dense, which may obscure small masses. FINDINGS: There are no findings suspicious for malignancy. Images were processed with CAD. IMPRESSION: No mammographic evidence of malignancy. A result letter of this screening mammogram will be mailed directly to the patient. RECOMMENDATION: Screening mammogram in one year. (Code:SM-B-01Y) BI-RADS CATEGORY  1: Negative. Electronically Signed   By: ThoEvangeline DakinD.   On: 03/26/2016 07:41    ELIGIBLE FOR  AVAILABLE RESEARCH PROTOCOL: no  ASSESSMENT: 51 y.o. [place] woman status post radical tumor debulking with optimal cytoreduction (R0) 03/27/2015 for a stage IIIC, high-grade right fallopian tube carcinoma  (a) baseline CA-125 was1226.  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  (c.1347A>G (p.Lys449Lys)  (1) adjuvant chemotherapy consisted of carboplatin and paclitaxel for 6 doses, begun 04/14/2015, completed 08/11/2015  (a) paclitaxel was omitted from cycle 5 and dose reduced on cycle 6 because of neuropathy   (b) a "make up" dose of paclitaxel was given 08/25/2015  (c) last carboplatin dose was 08/11/2015  (d) CA-125 normalized by 06/16/2015   (2) FIRST RECURRENCE: January 2018  (a) CA-125 rise beginning November 2017 led to CT scans and PET scans December 2017, all negative  (b) exploratory laparotomy 02/24/2016 showed pathologically confirmed recurrence, with miliary disease involving all examined surfaces  (c) 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  (a) niraparib or olaparib to follow as maintenance  PLAN: Stanton Kidney was alarmed at the change she noted under her port site metastases but to me they seem better altogether. I think what she is experiencing or palpating may well be scar tissue. Of course this could be tumor regrowth but it would be hard to explain why the tumor that we can see on top is shrinking while it would be growing under the muscle. In addition her CA 125 has taken appliance, which is very satisfactory. In short all indications is that the chemotherapy is working  Accordingly I think this is going to be scar tissue related to her prior procedure and only requires follow-up area  She feels tired and this is expected given the chemotherapy she is receiving. I encouraged her to continue to try to maintain a good functional status.  She will return to see me March 16 for cycle #3. I think he would be helpful for her to meet with Dr. Denman George at some point in the near future and I will see if we can arrange that for her  Chauncey Cruel, MD   04/16/2016 11:21  AM Medical Oncology and Hematology [facility] [place], [place] [address] Tel. [phone]    Fax. [phone]

## 2016-04-16 NOTE — Telephone Encounter (Signed)
Patient called in stating that for this past week she has noticed an increase tightening and thickening in her abdomen. Patient complains of pain with stretching, but more so being uncomfortable. Patient would like to be seen by MD Magrinat. MD [place] nurse informed and patient will be seen after labs today at 11am. Patient verbalized understanding.

## 2016-04-16 NOTE — Telephone Encounter (Signed)
Notified pt that Dr Denman George will not be available until 04/19/16, pt verbalized understanding. RN will present message to Dr Denman George.

## 2016-04-17 LAB — CA 125: Cancer Antigen (CA) 125: 56.2 U/mL — ABNORMAL HIGH (ref 0.0–38.1)

## 2016-04-19 ENCOUNTER — Telehealth: Payer: Self-pay | Admitting: Physical Therapy

## 2016-04-19 ENCOUNTER — Encounter: Payer: Self-pay | Admitting: *Deleted

## 2016-04-19 ENCOUNTER — Ambulatory Visit: Payer: 59 | Attending: Oncology | Admitting: Physical Therapy

## 2016-04-19 DIAGNOSIS — M6281 Muscle weakness (generalized): Secondary | ICD-10-CM | POA: Insufficient documentation

## 2016-04-19 DIAGNOSIS — M62838 Other muscle spasm: Secondary | ICD-10-CM | POA: Insufficient documentation

## 2016-04-19 NOTE — Telephone Encounter (Signed)
Called patient and left a message for patient. Patient called back and had missed her appointment.  Patient had her times wrong.  Earlie Counts, PT @3 /06/2016@ 3:14 PM

## 2016-04-21 ENCOUNTER — Ambulatory Visit: Payer: 59 | Admitting: Physical Therapy

## 2016-04-21 ENCOUNTER — Encounter: Payer: Self-pay | Admitting: Physical Therapy

## 2016-04-21 DIAGNOSIS — M6281 Muscle weakness (generalized): Secondary | ICD-10-CM | POA: Diagnosis present

## 2016-04-21 DIAGNOSIS — M62838 Other muscle spasm: Secondary | ICD-10-CM

## 2016-04-21 NOTE — Patient Instructions (Addendum)
Toilet Meditation PhysioYoga   Designer, jewellery   Guided Meditation for Pelvic Floor Relaxation  [employer]  STRETCHING THE PELVIC FLOOR MUSCLES NO DILATOR  Supplies . Vaginal lubricant . Mirror (optional) . Gloves (optional) Positioning . Start in a semi-reclined position with your head propped up. Bend your knees and place your thumb or finger at the vaginal opening. Procedure . Apply a moderate amount of lubricant on the outer skin of your vagina, the labia minora.  Apply additional lubricant to your finger. [name] Spread the skin away from the vaginal opening. Place the end of your finger at the opening. . Do a maximum contraction of the pelvic floor muscles. Tighten the vagina and the anus maximally and relax. . When you know they are relaxed, gently and slowly insert your finger into your vagina, directing your finger slightly downward, for 2-3 inches of insertion. . Relax and stretch the 6 o'clock position . Hold each stretch for _2 min__ and repeat __1_ time with rest breaks of _1__ seconds between each stretch. . Repeat the stretching in the 4 o'clock and 8 o'clock positions. . Total time should be _6__ minutes, _1__ x per day.  Note the amount of theme your were able to achieve and your tolerance to your finger in your vagina. . Once you have accomplished the techniques you may try them in standing with one foot resting on the tub, or in other positions.  This is a good stretch to do in the shower if you don't need to use lubricant.  PROTOCOL FOR DILATORS   1. Wash dilator with soap and water prior to insertion.    2. Lay on your back reclined. Knees are to be up and apart while on your bed or in the bathtub with warm water.   3. Lubricate the end of the dilator with a water-soluble lubricant.  4. Separate the labia.   5. Tense the pelvic floor muscles than relax; while relaxing, slide lubricated dilator into the vagina.    6. Tense muscles again while holding the  dilator so it does not get pushed out; relax and slide it in a little further.   7. Try blowing out as if filling a balloon; this may relax the muscles and allow penetration.  Repeat blowing out to insert dilator further.  8. Keep dilator in for 10 minutes if tolerate, with the pelvic floor muscles relaxed to further stretch the canal.   9. Never force the dilator into the canal.  10. 1-2 times per day [employer] dilator at [employer].[facility] [address] [place], [place] [address] Phone # [phone] Fax [phone]

## 2016-04-21 NOTE — Therapy (Signed)
[facility] [address], [place], [place], [address] Phone: [phone]   Fax:  [phone]  Physical Therapy Treatment  Patient Details  Name: Kathryn Ramsey MRN: [id number] Date of Birth: Oct 15, 1965 Referring Provider: Dr. Lurline Del  Encounter Date: 04/21/2016      PT End of Session - 04/21/16 0927    Visit Number 3   Date for PT Re-Evaluation 08/06/16   PT Start Time 0845   PT Stop Time 0928   PT Time Calculation (min) 43 min   Activity Tolerance Patient tolerated treatment well   Behavior During Therapy [facility] for tasks assessed/performed      Past Medical History:  Diagnosis Date  . Acute sensory neuropathy ([place]) 06/26/2015  . Allergy   . Anemia   . Asthma    illness induced asthma  . Blood transfusion without reported diagnosis    at age 51 years old D/T surgery to femur being crushed  . 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 Heart murmur    pt, states had a "working heart murmur"  . History of kidney stones   . PONV (postoperative nausea and vomiting)   . Skin cancer     Past Surgical History:  Procedure Laterality Date  . 3 laporscopic proceedures    . ABDOMINAL HYSTERECTOMY     2010  . CHOLECYSTECTOMY     2010  . DILATION AND CURETTAGE OF UTERUS     1997  . LAPAROSCOPY N/A 03/27/2015   Procedure: DIAGNOSTIC LAPAROSCOPY ;  Surgeon: [name], MD;  Location: [facility];  Service: Gynecology;  Laterality: N/A;  . LAPAROSCOPY N/A 02/24/2016   Procedure: LAPAROSCOPY DIAGNOSTIC WITH PERITONEAL WASHINGS AND PERITONEAL BIOPSY;  Surgeon: [name], MD;  Location: [facility];  Service: Gynecology;  Laterality: N/A;  . LAPAROTOMY N/A 03/27/2015   Procedure: EXPLORATORY LAPAROTOMY, BILATERAL SALPINGO OOPHORECTOMY, OMENTECTOMY, RADICAL TUMOR DEBULKING;  Surgeon: [name], MD;  Location: [facility];  Service: Gynecology;  Laterality: N/A;  .  sinus surgery 1994      There were no vitals filed for this visit.      Subjective Assessment - 04/21/16 0847    Subjective I am walking better and bowel movements are better.  Bladder is an issue.  I have been relaxing with going to the bathroom.  End of urination feel like a tightening of the bladder.  Other big issue is getting back to intercourse.  I am fearful.  I am doing the Vitamen E suppositories.    Patient Stated Goals improve pain with intercourse   Currently in Pain? Yes   Pain Score 2    Pain Location Abdomen   Pain Orientation Lower   Pain Descriptors / Indicators Discomfort;Tightness   Pain Type Acute pain   Pain Onset 1 to 4 weeks ago   Pain Frequency Intermittent   Aggravating Factors  end of urination   Pain Relieving Factors massage abdomen   Multiple Pain Sites No                      Pelvic Floor Special Questions - 04/21/16 0001    Pelvic Floor Internal Exam Patient confirmed identification and approves PT to assess muscle integrity   Exam Type Vaginal           [facility] Adult PT Treatment/Exercise - 04/21/16 0001  Manual Therapy   Manual Therapy Myofascial release   Myofascial Release release of the lower urogential dipahgram; release of the bladder with one hand on lower abdomen and other internally on side of bladder   Internal Pelvic Floor bil. sides of urethra and post. intoitus                PT Education - 04/21/16 0926    Education provided Yes   Education Details perineal massage; dilator; how to use a dilator; you tube videos   Methods Explanation;Demonstration;Handout   Comprehension Returned demonstration;Verbalized understanding          PT Short Term Goals - 04/21/16 0850      PT SHORT TERM GOAL #1   Title independent with scar massage to improve tissue mobility   Time 4   Period Weeks   Status Achieved     PT SHORT TERM GOAL #2   Title understand how to perform abdominal massage to promote  peristalic mobility to improve constipation   Time 4   Period Weeks   Status Achieved     PT SHORT TERM GOAL #3   Title understand how to toilet correctly to reduce strain on pelvic floor and ability to relax the pelvic floor    Time 4   Period Weeks   Status Achieved     PT SHORT TERM GOAL #4   Title ability to perfrom a pelvic floor contraction with a lift due to pelvic floor tone decreased and muscle able to fully contract   Time 4   Period Weeks   Status On-going     PT SHORT TERM GOAL #5   Title understand how to perform self perineal massage to elongate pelvic floor muscles for relaxation   Time 4   Period Weeks   Status Achieved           PT Long Term Goals - 04/08/16 1335      PT LONG TERM GOAL #1   Title independent with HEP   Time 4   Period Months   Status New     PT LONG TERM GOAL #2   Title reduction of bladder pressure when urinating >/= 80% due to increased mobility of bladder and abdominal tissue   Time 4   Period Months   Status New     PT LONG TERM GOAL #3   Title pain with penile penetration decreased >/= 75% so Marinoff scale is 1/3   Time 4   Period Months   Status New     PT LONG TERM GOAL #4   Title mobility of abdominal scars improved >/= 75% so patient is able to contract lower abdominals correctly with correct toileting technique   Time 4   Period Months   Status New     PT LONG TERM GOAL #5   Title understand how to relax the pelvic floor muscles during penile penetracton and vaginal exam to 2-4uv   Time 4   Period Months   Status New     Additional Long Term Goals   Additional Long Term Goals Yes               Plan - 04/21/16 0929    Clinical Impression Statement Patient is able to have a bowel movement without difficulty.  Patient is walking with less tension in pelvic floor.  Patient is doing her perineal massage.  After soft tissue work there was decrease swelling in the lateral abdominal wall.  Patient  understands how to do pelvic floor meditation.  Patient had less tenderness located in posterior introitus.  Patient will benefit from skilled therapy to reduce pain, increase pelvic floor mobility and increase strength.    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; presently receiving chemotherapy 1x/month;    PT Frequency 2x / week   PT Duration Other (comment)  4 months   PT Treatment/Interventions Biofeedback;Moist Heat;Therapeutic activities;Therapeutic exercise;Neuromuscular re-education;Patient/family education;Scar mobilization;Manual techniques;Dry needling;Manual lymph drainage;Taping;Cryotherapy;Electrical Stimulation   PT Next Visit Plan soft tissue work to pelvic floor area; joint mobilization to L4-S1;  hip stretches   PT Home Exercise Plan hip stretches   Consulted and Agree with Plan of Care Patient      Patient will benefit from skilled therapeutic intervention in order to improve the following deficits and impairments:  Pain, Decreased mobility, Decreased strength, Decreased activity tolerance, Decreased coordination, Decreased range of motion, Increased fascial restricitons, Impaired tone, Increased muscle spasms, Decreased scar mobility  Visit Diagnosis: Other muscle spasm  Muscle weakness (generalized)     Problem List Patient Active Problem List   Diagnosis Date Noted  . Lichen sclerosus et atrophicus of the vulva 03/31/2016  . Abdominal wall mass of right lower quadrant 03/08/2016  . 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 Goals of care, counseling/discussion 02/27/2016  . Elevated CA-125 02/24/2016  . Vaginal dryness, menopausal 10/01/2015  . Drug-induced peripheral neuropathy ([place]) 09/02/2015  . Leukopenia due to antineoplastic chemotherapy ([place]) 09/02/2015  . Chemotherapy-induced neuropathy ([place]) 08/19/2015  . Cellulitis 07/03/2015  . Acute sensory neuropathy ([place]) 06/26/2015   . High risk medication use 06/21/2015  . Facial paresthesia 06/18/2015  . Genetic testing 06/16/2015  . Restless legs 05/14/2015  . Family history of breast cancer in female 04/29/2015  . Chemotherapy-induced nausea 04/22/2015  . Chemotherapy induced neutropenia ([place]) 04/22/2015  . Cancer of right fallopian tube ([place]) 04/07/2015  . History of hysterectomy for benign disease 03/21/2015  . IBS (irritable bowel syndrome) 03/21/2015  . Dense breast tissue 03/21/2015  . History of cholecystectomy 03/21/2015  . 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, [place], [place], [address] Phone: [phone]   Fax:  [phone]  Name: Dezra Mandella MRN: [id number] Date of Birth: [birth year]

## 2016-04-22 ENCOUNTER — Encounter: Payer: Self-pay | Admitting: Gynecologic Oncology

## 2016-04-22 ENCOUNTER — Ambulatory Visit: Payer: 59 | Attending: Gynecologic Oncology | Admitting: Gynecologic Oncology

## 2016-04-22 VITALS — BP 120/73 | HR 68 | Temp 97.4°F | Resp 18 | Ht 63.0 in | Wt 120.0 lb

## 2016-04-22 DIAGNOSIS — Z85828 Personal history of other malignant neoplasm of skin: Secondary | ICD-10-CM | POA: Insufficient documentation

## 2016-04-22 DIAGNOSIS — Z9221 Personal history of antineoplastic chemotherapy: Secondary | ICD-10-CM | POA: Insufficient documentation

## 2016-04-22 DIAGNOSIS — Z79899 Other long term (current) drug therapy: Secondary | ICD-10-CM | POA: Diagnosis not present

## 2016-04-22 DIAGNOSIS — Z808 Family history of malignant neoplasm of other organs or systems: Secondary | ICD-10-CM | POA: Insufficient documentation

## 2016-04-22 DIAGNOSIS — Z803 Family history of malignant neoplasm of breast: Secondary | ICD-10-CM | POA: Insufficient documentation

## 2016-04-22 DIAGNOSIS — C5701 Malignant neoplasm of right fallopian tube: Secondary | ICD-10-CM | POA: Diagnosis not present

## 2016-04-22 DIAGNOSIS — Z823 Family history of stroke: Secondary | ICD-10-CM | POA: Insufficient documentation

## 2016-04-22 DIAGNOSIS — C786 Secondary malignant neoplasm of retroperitoneum and peritoneum: Secondary | ICD-10-CM

## 2016-04-22 DIAGNOSIS — R011 Cardiac murmur, unspecified: Secondary | ICD-10-CM | POA: Insufficient documentation

## 2016-04-22 DIAGNOSIS — J45909 Unspecified asthma, uncomplicated: Secondary | ICD-10-CM | POA: Diagnosis not present

## 2016-04-22 DIAGNOSIS — C799 Secondary malignant neoplasm of unspecified site: Secondary | ICD-10-CM | POA: Diagnosis not present

## 2016-04-22 DIAGNOSIS — Z807 Family history of other malignant neoplasms of lymphoid, hematopoietic and related tissues: Secondary | ICD-10-CM | POA: Insufficient documentation

## 2016-04-22 DIAGNOSIS — Z8249 Family history of ischemic heart disease and other diseases of the circulatory system: Secondary | ICD-10-CM | POA: Insufficient documentation

## 2016-04-22 DIAGNOSIS — C762 Malignant neoplasm of abdomen: Secondary | ICD-10-CM

## 2016-04-22 NOTE — Progress Notes (Signed)
FOLLOW UP VISIT: FALLOPIAN TUBE CANCER  Assessment:    51 y.o. 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    S/p diagnostic laparoscopy on 02/24/16. S/p cycle 3 of Doxil and Carboplatin salvage therapy. RLQ port site mets postop -responding to therapy. 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  Plan: Continue chemotherapy. If palpable residual masses after normalization of CA 125, would consider needle/core biopsy to evaluate for residual disease and would consider surgery and radiation to these sites if they remain as only residual measurable tumor sites. If her CA 125 normalizes and the masses completely regress, no surgical intervention would be necessary.  After normalization or plateau of the CA125 she should be considered for PARP inihibitor maintenance with Rucaparib.  HPI:  Kathryn Ramsey is a very pleasant 51 year old G4P4 who was originally seen in consultation on 03/17/15 at the request of Dr Collene Mares and Dr Garwin Brothers for peritoneal carcinomatosis. The patient has a history of a workup by urology for gross hematuria which included a pelvic examine was suggested for extrinsic mass effect on the bladder on cystoscopy. Cystoscopy took place on in December 2016. The patient denies abdominal pain bloating early satiety or abdominal distention.  On 08/03/2015 at [facility] she underwent a CT scan of the abdomen and pelvis as ordered by Dr Baruch Gouty. This revealed a 1.4 cm low attenuation lesion on the posterior right hepatic lobe suggestive of a hemangioma. Status post hysterectomy. No ovarian masses. Moderate ascites. Omental caking seen in the lateral left abdomen and pelvis. Peritoneal nodularity in the left lateral pelvic cul-de-sac. No gross extrinsic compression on the bladder was identified on imaging.  The patient was then seen by her gastroenterologist, Dr. Collene Mares, who performed a colonoscopy which  was unremarkable.  Tumor markers were drawn on 08/04/2015 and these included a CA-125 that was elevated to 957, and a CEA that was normal at 1.  The patient is otherwise a very healthy woman. She is an Futures trader. She has had a history of 4 spontaneous vaginal deliveries. She has a remote history of endometriosis. She is limited history of oral contraceptive pill usage (possibly 2 months). She a history of primary infertility and was treated with Clomid for her first pregnancy.  Her prior endometriosis was identified and treated with 3 laparoscopies. Her only other abdominal surgery was a laparoscopic cholecystectomy, and an LAVH on March 15th 2010 with removal of a right paratubal cyst for a fibroid uterus and menorrhagia. This was performed by Dr. Servando [place].   She has no remarkable history for malignancy. Her maternal aunt had non-hodgkins lymphoma.  On 03/27/15 she underwent diagnostic laparoscopy, exploratory laparotomy, BSO, omentectomy, argon beam ablation of tumor implants for an optimal cytoreduction (R0) for high grade serous fallopian tube cancer. Final pathology confirmed that her primary tumor was the right fallopian tube.  She had an uncomplicated postoperative stay in hospital and was discharged on POD 2.  She was evaluated in the office on POD 7 for increased abdominal discomfort and feeling a protrusion in her upper abdomen.CT scan confirmed no hernia.  She went on to receive 6 cycles of carboplatin and paclitaxel adjuvant therapy between 04/14/15 and 08/11/15. She developed severe neuropathy symptoms which necessitated treatment delay of cycle 5 and weekly scheduling of taxol on cycle 6.  CA 125 normalized quickly during primary treatment:  1226 on 03/20/15 165 on 04/28/15 44 on 05/22/15 26.1 on 06/16/15 18.7  on 08/11/15 17 on 7/17/7 15 on 09/29/15.  Post-treatment imaging with CT chest,abdo, pelvis on 09/29/15 showed complete resolution of ascites and peritoneal  implants. There was a stable 1.5cm liver hemangioma present.  On 01/06/16 she felt unwell with a headache and requested labs. On 01/07/16 CA 125 was noted to be elevated at 69.1.  On 01/16/16 she had a CT scan which showed no measurable recurrence. On 01/26/16 a PET/CT showed no measurable recurrence.  Repeat CA 125 on 02/13/16 was substantially elevated at 914.   On 02/24/16 she was taken to the operating room for a diagnostic laparoscopy which confirmed low volume carcinomatosis. This was biopsied and confirmed to be high grade serous carcinoma, consistent with recurrent ovarian cancer.  Since 02/28/15 she began noticing increasing firmness and lumps around the 2 right lower quadrant 48m port sites. These areas are not very painful, not draining, normal overlying skin, but slowly progressing in size. Ultrasound on 03/11/16: Limited sonographic evaluation in area of palpable concern in right lower quadrant of abdomen demonstrates complex abnormality with cystic component measuring 3.4 x 2.2 x 0.8 cm. Another complex abnormality measuring 3.2 x 2.6 x 1.0 cm is noted just inferior to the other abnormality. It is uncertain if these abnormalities represent possible hernias or masses.  Interval Hx: Carboplatin and Doxil started 03/05/16.  CA 125 reduced to 577 on day 1 of cycle 1 (03/05/16). It was 451 on 03/15/16, 122 on 04/02/16, and 56 on 04/16/16 (cycle 3).  She has been bothered by symptoms of pulling and prominance of the right lower quadrant port site metastases that she feels have significantly decreased in size, but are more noticeable when she moves and works out. She has been working out more.  Past Medical History:  Diagnosis Date  . [place] sensory [place] ([place] 06/26/2015  . Allergy   . Anemia   . Asthma    illness induced asthma  . Blood transfusion without reported diagnosis    at age s1years old D/T surgery to femur being crushed  . Complication of anesthesia    hx. of allergic  to ether (had surgery at 5 years and had reaction to the ether  . Heart murmur    pt, states had a "working heart murmur"  . History of kidney stones   . PONV (postoperative nausea and vomiting)   . Skin cancer    Past Surgical History:  Procedure Laterality Date  . 3 laporscopic proceedures    . ABDOMINAL HYSTERECTOMY     2010  . CHOLECYSTECTOMY     2010  . DILATION AND CURETTAGE OF UTERUS     1997  . LAPAROSCOPY N/A 03/27/2015   Procedure: DIAGNOSTIC LAPAROSCOPY ;  Surgeon: EEveritt Amber MD;  Location: [facility];  Service: Gynecology;  Laterality: N/A;  . LAPAROSCOPY N/A 02/24/2016   Procedure: LAPAROSCOPY DIAGNOSTIC WITH PERITONEAL WASHINGS AND PERITONEAL BIOPSY;  Surgeon: EEveritt Amber MD;  Location: [facility];  Service: Gynecology;  Laterality: N/A;  . LAPAROTOMY N/A 03/27/2015   Procedure: EXPLORATORY LAPAROTOMY, BILATERAL SALPINGO OOPHORECTOMY, OMENTECTOMY, RADICAL TUMOR DEBULKING;  Surgeon: EEveritt Amber MD;  Location: [facility];  Service: Gynecology;  Laterality: N/A;  . sinus surgery 1994     Family History  Problem Relation Age of Onset  . Hypertension Father   . Skin cancer Father     nonmelanoma skin cancers in his late 810s . Non-Hodgkin's lymphoma Maternal Aunt     dx. 666s smoker  . Stroke Maternal Grandmother   .  Other Mother     benign meningioma dx. early-mid-70s; hysterectomy in her late 29s for heavy periods - still has ovaries  . Other Son     one son with pre-cancerous skin findings  . Other Daughter     cysts on ovaries and hx of heavy periods  . Other Sister     hysterectomy for cysts - still has ovaries  . Heart attack Maternal Grandfather   . Heart attack Paternal Grandfather   . Renal cancer Maternal Aunt 60    smoker  . Breast cancer Maternal Aunt 78  . Other Maternal Aunt     dx. benign brain tumor (meningioma) at 4; 2nd benign brain tumor in her late 74s; hx of radical hysterectomy at age 39  . Brain cancer Other     NOS tumor   Social History   Social  History  . Marital status: Married    Spouse name: N/A  . Number of children: 4  . 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 Smoking status: Never Smoker  . Smokeless tobacco: Never Used  . Alcohol use Yes     Comment:  3 glasses wine or beer/week  . Drug use: No  . Sexual activity: Not on file   Other Topics Concern  . Not on file   Social History Narrative   Lives at home w/ husband and children   Right-handed   3 cups caffeine per day   Allergies  Allergen Reactions  . Bee Venom Shortness Of Breath    swelling  . Sulfa Antibiotics Shortness Of Breath    Vomit , diarrhea , hives   Current Outpatient Prescriptions on File Prior to Visit  Medication Sig Dispense Refill  . B COMPLEX VITAMINS SL Place 1 tablet under the tongue daily.    . clidinium-chlordiazePOXIDE (LIBRAX) 5-2.5 MG capsule Take 1 capsule by mouth at bedtime as needed (IBS symptoms). Reported on 08/11/2015  0  . dexamethasone (DECADRON) 4 MG tablet Take 2 tablets (8 mg total) by mouth daily. Start the day after chemotherapy for 2 days. 30 tablet 1  . dexamethasone (DECADRON) 4 MG tablet Take 2 tablets (8 mg total) by mouth daily. Start the day after chemotherapy for 2 days. 30 tablet 1  . gabapentin (NEURONTIN) 100 MG capsule Take 1 capsule (100 mg total) by mouth 3 (three) times daily. (Patient taking differently: Take 100 mg by mouth See admin instructions. Takes 112m at bedtime. Takes an additional 1013mduring the day if needed for nerve pain.) 90 capsule 2  . ibuprofen (ADVIL,MOTRIN) 200 MG tablet Take 400 mg by mouth daily as needed for mild pain. Reported on 08/18/2015    . Lactobacillus (PROBIOTIC ACIDOPHILUS PO) Take 1 capsule by mouth every evening.    . Marland KitchenORazepam (ATIVAN) 0.5 MG tablet Take 0.5 mg by mouth at bedtime.    . Marland KitchenORazepam (ATIVAN) 0.5 MG tablet Take 1 tablet (0.5 mg total) by mouth every 6 (six) hours as needed (Nausea or vomiting). 30  tablet 0  . LORazepam (ATIVAN) 0.5 MG tablet Take 1 tablet (0.5 mg total) by mouth every 6 (six) hours as needed (Nausea or vomiting). 30 tablet 0  . Multiple Vitamin (MULTIVITAMIN WITH MINERALS) TABS tablet Take 1 tablet by mouth daily.    . ondansetron (ZOFRAN) 8 MG tablet Take 1 tablet (8 mg total) by mouth 2 (two) times daily as needed for refractory nausea /  vomiting. Start on day 3 after chemo. 30 tablet 1  . ondansetron (ZOFRAN) 8 MG tablet Take 1 tablet (8 mg total) by mouth 2 (two) times daily as needed for refractory nausea / vomiting. Start on day 3 after chemo. 30 tablet 1  . oxyCODONE (OXY IR/ROXICODONE) 5 MG immediate release tablet Take 1 tablet (5 mg total) by mouth once as needed (for pain score of 1-4). 30 tablet 0  . prochlorperazine (COMPAZINE) 10 MG tablet Take 1 tablet (10 mg total) by mouth every 6 (six) hours as needed (Nausea or vomiting). 30 tablet 1  . prochlorperazine (COMPAZINE) 10 MG tablet Take 1 tablet (10 mg total) by mouth every 6 (six) hours as needed (Nausea or vomiting). 30 tablet 1  . senna (SENOKOT) 8.6 MG TABS tablet Take 1 tablet (8.6 mg total) by mouth at bedtime. 120 each 0  . TURMERIC PO Take 1 tablet by mouth daily.     No current facility-administered medications on file prior to visit.     Review of systems: Constitutional:  She has no weight gain or weight loss. She has no fever or chills. Eyes: No blurred vision Ears, Nose, Mouth, Throat: No dizziness, headaches or changes in hearing. No mouth sores. Cardiovascular: No chest pain, palpitations or edema. Respiratory:  No shortness of breath, wheezing or cough Gastrointestinal: She has normal bowel movements without diarrhea or constipation. She denies any nausea or vomiting. She denies blood in her stool or heart burn. Genitourinary:  She denies pelvic pain, pelvic pressure or changes in her urinary function. She has no hematuria, dysuria, or incontinence. She has no irregular vaginal bleeding or  vaginal discharge + vaginal dryness Musculoskeletal: Denies muscle weakness or joint pains.  Skin:  She has no skin changes, rashes or itching Neurological:  + peripheral neuropathy Psychiatric:  She denies depression or anxiety. Hematologic/Lymphatic:   No easy bruising or bleeding   Physical Exam: Blood pressure 120/73, pulse 68, temperature 97.4 F (36.3 C), temperature source Oral, resp. rate 18, height '5\' 3"'  (1.6 m), weight 120 lb (54.4 kg). General: Well dressed, well nourished in no apparent distress.   HEENT:  Normocephalic and atraumatic, no lesions.  Extraocular muscles intact. Sclerae anicteric. Pupils equal, round, reactive. No mouth sores or ulcers. Thyroid is normal size, not nodular, midline. Skin:  No lesions or rashes. Lungs:  Clear to auscultation bilaterally.  No wheezes. Cardiovascular:  Regular rate and rhythm.  No murmurs or rubs. Abdomen:  Soft, nontender, nondistended.  No palpable masses.  No hepatosplenomegaly.  No ascites. Normal bowel sounds. <1cm firm mobile nodule underlying upper right laparoscopic port (dramatically reduced in size from prior exam) and 1cm firm mobile abdominal wall nodule on lower incision (also decreased in size). Non fluctuant, minimally tender. No drainage. Overlying skin is normal and incision well healed.  Genitourinary: No pelvic masses, normal external genitalia, normal vagina (atrophic), well healed cuff, surgically absent cervix and uterus. Extremities: No cyanosis, clubbing or edema.  No calf tenderness or erythema. No palpable cords. Psychiatric: Mood and affect are appropriate. Neurological: Awake, alert and oriented x 3. Sensation is intact, no neuropathy.  Musculoskeletal: No pain, normal strength and range of motion.   [name], MD

## 2016-04-22 NOTE — Patient Instructions (Signed)
Follow up with Dr. Jana Hakim as scheduled. No return appointment with Dr. Denman George at this time.

## 2016-04-30 ENCOUNTER — Ambulatory Visit (HOSPITAL_BASED_OUTPATIENT_CLINIC_OR_DEPARTMENT_OTHER): Payer: 59 | Admitting: Oncology

## 2016-04-30 ENCOUNTER — Other Ambulatory Visit: Payer: Self-pay | Admitting: *Deleted

## 2016-04-30 ENCOUNTER — Telehealth: Payer: Self-pay | Admitting: Oncology

## 2016-04-30 ENCOUNTER — Ambulatory Visit: Payer: 59

## 2016-04-30 ENCOUNTER — Other Ambulatory Visit (HOSPITAL_BASED_OUTPATIENT_CLINIC_OR_DEPARTMENT_OTHER): Payer: 59

## 2016-04-30 ENCOUNTER — Ambulatory Visit (HOSPITAL_BASED_OUTPATIENT_CLINIC_OR_DEPARTMENT_OTHER): Payer: 59

## 2016-04-30 VITALS — BP 120/75 | HR 64 | Temp 97.5°F | Resp 18 | Ht 63.0 in | Wt 120.7 lb

## 2016-04-30 DIAGNOSIS — T451X5A Adverse effect of antineoplastic and immunosuppressive drugs, initial encounter: Principal | ICD-10-CM

## 2016-04-30 DIAGNOSIS — C786 Secondary malignant neoplasm of retroperitoneum and peritoneum: Secondary | ICD-10-CM

## 2016-04-30 DIAGNOSIS — K5900 Constipation, unspecified: Secondary | ICD-10-CM

## 2016-04-30 DIAGNOSIS — C5701 Malignant neoplasm of right fallopian tube: Secondary | ICD-10-CM

## 2016-04-30 DIAGNOSIS — C762 Malignant neoplasm of abdomen: Secondary | ICD-10-CM | POA: Diagnosis not present

## 2016-04-30 DIAGNOSIS — D701 Agranulocytosis secondary to cancer chemotherapy: Secondary | ICD-10-CM | POA: Diagnosis not present

## 2016-04-30 DIAGNOSIS — C801 Malignant (primary) neoplasm, unspecified: Secondary | ICD-10-CM

## 2016-04-30 DIAGNOSIS — G608 Other hereditary and idiopathic neuropathies: Secondary | ICD-10-CM

## 2016-04-30 LAB — CBC WITH DIFFERENTIAL/PLATELET
BASO%: 0.4 % (ref 0.0–2.0)
Basophils Absolute: 0 10*3/uL (ref 0.0–0.1)
EOS%: 0.4 % (ref 0.0–7.0)
Eosinophils Absolute: 0 10*3/uL (ref 0.0–0.5)
HCT: 35.5 % (ref 34.8–46.6)
HGB: 12.3 g/dL (ref 11.6–15.9)
LYMPH%: 65.3 % — ABNORMAL HIGH (ref 14.0–49.7)
MCH: 32.8 pg (ref 25.1–34.0)
MCHC: 34.6 g/dL (ref 31.5–36.0)
MCV: 94.7 fL (ref 79.5–101.0)
MONO#: 0.3 10*3/uL (ref 0.1–0.9)
MONO%: 11.7 % (ref 0.0–14.0)
NEUT#: 0.6 10*3/uL — ABNORMAL LOW (ref 1.5–6.5)
NEUT%: 22.2 % — ABNORMAL LOW (ref 38.4–76.8)
Platelets: 273 10*3/uL (ref 145–400)
RBC: 3.75 10*6/uL (ref 3.70–5.45)
RDW: 16.4 % — ABNORMAL HIGH (ref 11.2–14.5)
WBC: 2.7 10*3/uL — ABNORMAL LOW (ref 3.9–10.3)
lymph#: 1.7 10*3/uL (ref 0.9–3.3)

## 2016-04-30 LAB — COMPREHENSIVE METABOLIC PANEL
ALT: 20 U/L (ref 0–55)
AST: 21 U/L (ref 5–34)
Albumin: 4.4 g/dL (ref 3.5–5.0)
Alkaline Phosphatase: 71 U/L (ref 40–150)
Anion Gap: 11 mEq/L (ref 3–11)
BUN: 10.4 mg/dL (ref 7.0–26.0)
CO2: 23 mEq/L (ref 22–29)
Calcium: 9.9 mg/dL (ref 8.4–10.4)
Chloride: 104 mEq/L (ref 98–109)
Creatinine: 0.7 mg/dL (ref 0.6–1.1)
EGFR: >90 mL/min/{1.73_m2} (ref >90)
Glucose: 90 mg/dl (ref 70–140)
Potassium: 3.8 mEq/L (ref 3.5–5.1)
Sodium: 138 mEq/L (ref 136–145)
Total Bilirubin: 0.51 mg/dL (ref 0.20–1.20)
Total Protein: 7.9 g/dL (ref 6.4–8.3)

## 2016-04-30 MED ORDER — FILGRASTIM 300 MCG/0.5ML IJ SOSY: 300.0000 ug | PREFILLED_SYRINGE | Freq: Once | INTRAMUSCULAR | Status: DC

## 2016-04-30 MED ORDER — GABAPENTIN 100 MG PO CAPS: 100.0000 mg | ORAL_CAPSULE | Freq: Every day | ORAL | 4 refills | Status: DC

## 2016-04-30 MED ORDER — VALACYCLOVIR HCL 1 G PO TABS: 1000.0000 mg | ORAL_TABLET | Freq: Two times a day (BID) | ORAL | 3 refills | Status: DC

## 2016-04-30 MED ORDER — TBO-FILGRASTIM 300 MCG/0.5ML ~~LOC~~ SOSY
300.0000 ug | PREFILLED_SYRINGE | Freq: Once | SUBCUTANEOUS | Status: AC
  Administered 2016-04-30: 300 ug via SUBCUTANEOUS
  Filled 2016-04-30: qty 0.5

## 2016-04-30 NOTE — Progress Notes (Signed)
[place]  Telephone:([phone] Fax:(336) [phone]     ID: Kathryn Ramsey DOB: 11-27-1965  MR#: [id number]  VWP#:[id number]  Patient Care Team: Harlan Stains, MD as PCP - General (Family Medicine) Gordy Levan, MD as Consulting Physician (Oncology) [name], MD as Consulting Physician (Obstetrics and Gynecology) Juanita Craver, MD as Consulting Physician (Gastroenterology) Servando Salina, MD as Consulting Physician (Obstetrics and Gynecology) Nickie Retort, MD as Consulting Physician (Urology) Chauncey Cruel, MD OTHER MD:  CHIEF COMPLAINT: Recurrent ovarian cancer  CURRENT TREATMENT: Carboplatin, liposomal doxorubicin   HISTORY OF PRESENT ILLNESS: From Dr. Serita Grit original intake note 03/17/2015:  "Kathryn Ramsey is a very pleasant G4P4 who is seen in consultation at the request of Dr Collene Mares and Dr Garwin Brothers for peritoneal carcinomatosis. The patient has a history of a workup by urology for gross hematuria which included a pelvic examine was suggested for extrinsic mass effect on the bladder on cystoscopy. Cystoscopy took place on in December 2016. The patient denies abdominal pain bloating early satiety or abdominal distention.  On 08/03/2015 at [facility] she underwent a CT scan of the abdomen and pelvis as ordered by Dr Baruch Gouty. This revealed a 1.4 cm low attenuation lesion on the posterior right hepatic lobe suggestive of a hemangioma. Status post hysterectomy. No ovarian masses. Moderate ascites. Omental caking seen in the lateral left abdomen and pelvis. Peritoneal nodularity in the left lateral pelvic cul-de-sac. No gross extrinsic compression on the bladder was identified on imaging.  The patient was then seen by her gastroenterologist, Dr. Collene Mares, who performed a colonoscopy which was unremarkable.  Tumor markers were drawn on 08/04/2015 and these included a CA-125 that was elevated to 957, and a CEA that was normal at 1."  On 03/27/2015 the  patient underwent diagnostic laparoscopy, exploratory laparotomy with bilateral salpingo-oophorectomy, omentectomy, and radical tumor debulking with optimal cytoreduction (R0) of what proved to be a stage IIIc right fallopian tube cancer. She was treated adjuvantly with carboplatin and paclitaxel, as detailed below. Her CA 125 dropped from 1226 on 03/20/2015 to 26.1 by 06/16/2015.  Her subsequent history is as detailed below.  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 [place]at 3 AM this [place] According to her husband she would start at 10 in the morning and go all day  She is here for cycle 3 of carboplatin and liposomal doxorubicin. However her counts today we'll not allow her to be treated   REVIEW OF SYSTEMS: Kathryn Ramsey continues to have problems with constipation. She does have hard bowel movements every 2 or 3 days, more like pellets. She is using 2 stool softeners at bedtime. She is using magnesium sulfate every 4 days or so. She is not on MiraLAX. She does take probiotics. She had a sore in her mouth for about 3 weeks but it has resolved. She feels the area support recurrence are flatter. Aside from these issues a detailed review of systems today was stable   PAST MEDICAL HISTORY: Past Medical History:  Diagnosis Date  . Acute sensory neuropathy ([place]) 06/26/2015  . Allergy   . Anemia   . Asthma    illness induced asthma  . Blood transfusion without reported diagnosis    at age 39 years old D/T surgery to femur being crushed  . 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 Heart  murmur    pt, states had a "working heart murmur"  . History of kidney stones   . PONV (postoperative nausea and vomiting)   . Skin cancer     PAST SURGICAL HISTORY: Past Surgical History:  Procedure Laterality Date  . 3 laporscopic proceedures    .  ABDOMINAL HYSTERECTOMY     2010  . CHOLECYSTECTOMY     2010  . DILATION AND CURETTAGE OF UTERUS     1997  . LAPAROSCOPY N/A 03/27/2015   Procedure: DIAGNOSTIC LAPAROSCOPY ;  Surgeon: [name], MD;  Location: [facility];  Service: Gynecology;  Laterality: N/A;  . LAPAROSCOPY N/A 02/24/2016   Procedure: LAPAROSCOPY DIAGNOSTIC WITH PERITONEAL WASHINGS AND PERITONEAL BIOPSY;  Surgeon: [name], MD;  Location: [facility];  Service: Gynecology;  Laterality: N/A;  . LAPAROTOMY N/A 03/27/2015   Procedure: EXPLORATORY LAPAROTOMY, BILATERAL SALPINGO OOPHORECTOMY, OMENTECTOMY, RADICAL TUMOR DEBULKING;  Surgeon: [name], MD;  Location: [facility];  Service: Gynecology;  Laterality: N/A;  . sinus surgery 1994      FAMILY HISTORY Family History  Problem Relation Age of Onset  . Hypertension Father   . Skin cancer Father     nonmelanoma skin cancers in his late 30s  . Non-Hodgkin's lymphoma Maternal Aunt     dx. 82s; smoker  . Stroke Maternal Grandmother   . Other Mother     benign meningioma dx. early-mid-70s; hysterectomy in her late 69s for heavy periods - still has ovaries  . Other Son     one son with pre-cancerous skin findings  . Other Daughter     cysts on ovaries and hx of heavy periods  . Other Sister     hysterectomy for cysts - still has ovaries  . Heart attack Maternal Grandfather   . Heart attack Paternal Grandfather   . Renal cancer Maternal Aunt 60    smoker  . Breast cancer Maternal Aunt 78  . Other Maternal Aunt     dx. benign brain tumor (meningioma) at 68; 2nd benign brain tumor in her late 42s; hx of radical hysterectomy at age 38  . Brain cancer Other     NOS tumor  The patient's parents are both living, in their late 22s as of January 2018. The patient has one brother, 3 sisters. There is no history of ovarian cancer in the family. One maternal aunt had breast and kidney cancer, another had non-Hodgkin's lymphoma.  GYNECOLOGIC HISTORY:  No LMP recorded. Patient has had a  hysterectomy. 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 Her husband Kathryn Ramsey is a Engineer, maintenance (IT). Their children are 80, 30, 6 and 94 y/o as of JAN 2018. The oldest works as an Electrical engineer in the [employer] in [place]. The other 3 children live in [place], the youngest currently attending [employer]. Rhe patient has no grandchildren. She attends [employer]    ADVANCED DIRECTIVES:    HEALTH MAINTENANCE: Social History  Substance Use Topics  . Smoking status: Never Smoker  . Smokeless tobacco: Never Used  . Alcohol use Yes     Comment:  3 glasses wine or beer/week     Colonoscopy:2016  PAP: s/p hyst  Bone density:   Allergies  Allergen Reactions  . Bee Venom Shortness Of Breath    swelling  . Sulfa Antibiotics Shortness Of Breath    Vomit , diarrhea , hives  Current Outpatient Prescriptions  Medication Sig Dispense Refill  . B COMPLEX VITAMINS SL Place 1 tablet under the tongue daily.    . clidinium-chlordiazePOXIDE (LIBRAX) 5-2.5 MG capsule Take 1 capsule by mouth at bedtime as needed (IBS symptoms). Reported on 08/11/2015  0  . dexamethasone (DECADRON) 4 MG tablet Take 2 tablets (8 mg total) by mouth daily. Start the day after chemotherapy for 2 days. (Patient not taking: Reported on 04/22/2016) 30 tablet 1  . dexamethasone (DECADRON) 4 MG tablet Take 2 tablets (8 mg total) by mouth daily. Start the day after chemotherapy for 2 days. 30 tablet 1  . gabapentin (NEURONTIN) 100 MG capsule Take 1 capsule (100 mg total) by mouth 3 (three) times daily. (Patient taking differently: Take 100 mg by mouth See admin instructions. Takes 155m at bedtime. Takes an additional 109mduring the day if needed for nerve pain.) 90 capsule 2  . ibuprofen (ADVIL,MOTRIN) 200 MG tablet Take 400 mg by mouth daily as needed for mild pain. Reported on 08/18/2015    . Lactobacillus (PROBIOTIC ACIDOPHILUS  PO) Take 1 capsule by mouth every evening.    . Marland KitchenORazepam (ATIVAN) 0.5 MG tablet Take 0.5 mg by mouth at bedtime.    . Marland KitchenORazepam (ATIVAN) 0.5 MG tablet Take 1 tablet (0.5 mg total) by mouth every 6 (six) hours as needed (Nausea or vomiting). 30 tablet 0  . LORazepam (ATIVAN) 0.5 MG tablet Take 1 tablet (0.5 mg total) by mouth every 6 (six) hours as needed (Nausea or vomiting). 30 tablet 0  . Multiple Vitamin (MULTIVITAMIN WITH MINERALS) TABS tablet Take 1 tablet by mouth daily.    . ondansetron (ZOFRAN) 8 MG tablet Take 1 tablet (8 mg total) by mouth 2 (two) times daily as needed for refractory nausea / vomiting. Start on day 3 after chemo. 30 tablet 1  . ondansetron (ZOFRAN) 8 MG tablet Take 1 tablet (8 mg total) by mouth 2 (two) times daily as needed for refractory nausea / vomiting. Start on day 3 after chemo. 30 tablet 1  . oxyCODONE (OXY IR/ROXICODONE) 5 MG immediate release tablet Take 1 tablet (5 mg total) by mouth once as needed (for pain score of 1-4). 30 tablet 0  . prochlorperazine (COMPAZINE) 10 MG tablet Take 1 tablet (10 mg total) by mouth every 6 (six) hours as needed (Nausea or vomiting). 30 tablet 1  . prochlorperazine (COMPAZINE) 10 MG tablet Take 1 tablet (10 mg total) by mouth every 6 (six) hours as needed (Nausea or vomiting). 30 tablet 1  . senna (SENOKOT) 8.6 MG TABS tablet Take 1 tablet (8.6 mg total) by mouth at bedtime. 120 each 0  . TURMERIC PO Take 1 tablet by mouth daily.     No current facility-administered medications for this visit.     OBJECTIVE: middle aged White woman Who appears well  Vitals:   04/30/16 0845  BP: 120/75  Pulse: 64  Resp: 18  Temp: 97.5 F (36.4 C)     Body mass index is 21.38 kg/m.    ECOG FS:0 - Asymptomatic  Sclerae unicteric, pupils round and equal Oropharynx clear and moist-- no thrush or other lesions No cervical or supraclavicular adenopathy Lungs no rales or rhonchi Heart regular rate and rhythm Abd soft, nontender, positive  bowel sounds MSK no focal spinal tenderness, no upper extremity lymphedema Neuro: nonfocal, well oriented, appropriate affect Breasts: Deferred Skin: The port site metastases continued to regress, although they are still palpable subcutaneously.   Photo of right  lower quadrant port-site metastases 03/15/2016    Photo 04/02/2016    LAB RESULTS:  CMP     Component Value Date/Time   NA 136 04/16/2016 1103   K 4.3 04/16/2016 1103   CL 104 06/18/2015 1248   CO2 26 04/16/2016 1103   GLUCOSE 97 04/16/2016 1103   BUN 9.5 04/16/2016 1103   CREATININE 0.7 04/16/2016 1103   CALCIUM 9.6 04/16/2016 1103   PROT 7.3 04/16/2016 1103   ALBUMIN 4.3 04/16/2016 1103   AST 21 04/16/2016 1103   ALT 21 04/16/2016 1103   ALKPHOS 60 04/16/2016 1103   BILITOT 0.60 04/16/2016 1103   GFRNONAA >60 06/18/2015 1248   GFRAA >60 06/18/2015 1248    INo results found for: SPEP, UPEP  Lab Results  Component Value Date   WBC 2.7 (L) 04/30/2016   NEUTROABS 0.6 (L) 04/30/2016   HGB 12.3 04/30/2016   HCT 35.5 04/30/2016   MCV 94.7 04/30/2016   PLT 273 04/30/2016      Chemistry      Component Value Date/Time   NA 136 04/16/2016 1103   K 4.3 04/16/2016 1103   CL 104 06/18/2015 1248   CO2 26 04/16/2016 1103   BUN 9.5 04/16/2016 1103   CREATININE 0.7 04/16/2016 1103      Component Value Date/Time   CALCIUM 9.6 04/16/2016 1103   ALKPHOS 60 04/16/2016 1103   AST 21 04/16/2016 1103   ALT 21 04/16/2016 1103   BILITOT 0.60 04/16/2016 1103       No results found for: LABCA2  No components found for: LABCA125  No results for input(s): INR in the last 168 hours.  Urinalysis    Component Value Date/Time   COLORURINE YELLOW 03/29/2015 0920   APPEARANCEUR CLEAR 03/29/2015 0920   LABSPEC 1.005 10/09/2015 1239   PHURINE 6.0 10/09/2015 1239   PHURINE 7.5 03/29/2015 0920   GLUCOSEU Negative 10/09/2015 1239   HGBUR Negative 10/09/2015 1239   HGBUR SMALL (A) 03/29/2015 0920   BILIRUBINUR  Negative 10/09/2015 1239   KETONESUR Negative 10/09/2015 1239   KETONESUR NEGATIVE 03/29/2015 0920   PROTEINUR Negative 10/09/2015 1239   PROTEINUR NEGATIVE 03/29/2015 0920   UROBILINOGEN 0.2 10/09/2015 1239   NITRITE Negative 10/09/2015 1239   NITRITE NEGATIVE 03/29/2015 0920   LEUKOCYTESUR Negative 10/09/2015 1239     STUDIES: Mammography at the [facility] February 2018 and showed the breast density to be category C. There was no evidence of malignancy.   ELIGIBLE FOR AVAILABLE RESEARCH PROTOCOL: no  ASSESSMENT: 51 y.o. [place] woman status post radical tumor debulking with optimal cytoreduction (R0) 03/27/2015 for a stage IIIC, high-grade right fallopian tube carcinoma  (a) baseline CA-125 was1226.  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)  (1) adjuvant chemotherapy consisted of carboplatin and paclitaxel for 6 doses, begun 04/14/2015, completed 08/11/2015  (a) paclitaxel was omitted from cycle 5 and dose reduced on cycle 6 because of neuropathy   (b) a "make up" dose of paclitaxel was given 08/25/2015  (c) last carboplatin dose was 08/11/2015  (d) CA-125 normalized by 06/16/2015   (2) FIRST RECURRENCE: January 2018  (a) CA-125 rise beginning November 2017 led to CT scans and PET scans December 2017, all negative  (b) exploratory laparotomy 02/24/2016 showed pathologically confirmed recurrence, with miliary disease involving all examined surfaces  (c) port-site metastases noted 03/08/2016  (3) carboplatin/liposomal  doxorubicin to started 03/05/2016, repeated every three weeks.  (a) cycle 3 delayed one week because of neutropenia; OnPro added  (b) niraparib or olaparib or RUCAPARIB to follow as maintenance  PLAN Kathryn Ramsey tolerated her first 2 cycles of chemotherapy well and she is having an excellent  response. They obviously measurable disease on her port metastases are flatter and smaller. Her CA-27-29 has dropped by one log.  Accordingly we very much wish to proceed with the current treatment, but we will have to assist her bone marrow which is doubtless limited by her prior treatments.  She will receive Neupogen for the next 3 days, and then we will try to get her third cycle of chemotherapy week from today. With that cycle we will start OnPro, and hopefully that will take care of the neutropenia problem.  The plan continues to be to continue likely through 6 cycles and then restage. In the meantime we can continue to follow the palpable disease on her abdomen and if it persists when all else is cleared as Dr. Denman George suggests we will obtain biopsies and consider local therapy for those sites  She will see me again in a week. He knows to call for any problems that may develop before that visit.   :Chauncey Cruel, MD   04/30/2016 9:01 AM Medical Oncology and Hematology [facility] [place],  [address] Tel. [phone]    Fax. [phone]

## 2016-04-30 NOTE — Patient Instructions (Signed)
Leukopenia Leukopenia is a condition in which you have a low number of white blood cells. White blood cells help the body to fight infections. The number of white blood cells in the body varies from person to person. There are five types of white blood cells. Two types (lymphocytes and neutrophils) make up most of the white blood cell count. When lymphocytes are low, the condition is called lymphocytopenia. When neutrophils are low, it is called neutropenia. Neutropenia is the most dangerous type of leukopenia because it can lead to dangerous infections. What are the causes? This condition is commonly caused by damage to soft tissue inside of the bones (bone marrow), which is where most white blood cells are made. Bone marrow can get damaged by:  Medicine or X-ray treatments for cancer (chemotherapy or radiation therapy).  Serious infections.  Cancer of the white blood cells (leukemia, lymphoma, or myeloma).  Medicines, including:  Certain antibiotics.  Certain heart medicines.  Steroids.  Certain medicines used to treat diseases of the immune system (autoimmune diseases), like rheumatoid arthritis. Leukopenia also happens when white blood cells are destroyed after leaving the bone marrow, which may result from:  Liver disease.  Autoimmune disease.  Vitamin B deficiencies. What are the signs or symptoms? One of the most common signs of leukopenia, especially severe neutropenia, is having a lot of bacterial infections. Different infections have different symptoms. An infection in your lungs may cause coughing. A urinary tract infection may cause frequent urination and a burning sensation. You may also get infections of the blood, skin, rectum, throat, sinuses, or ears. Some people have no symptoms. If you do have symptoms, they may include:  Fever.  Fatigue.  Swollen glands (lymph nodes).  Painful mouth ulcers.  Gum disease. How is this diagnosed? This condition may be  diagnosed based on:  Your medical history.  A physical exam to check for swollen lymph nodes and an enlarged spleen. Your spleen is an organ on the left side of your body that stores white blood cells.  Tests, such as:  A complete blood count. This blood test counts each type of white cell.  Bone marrow aspiration. Some bone marrow is removed to be checked under a microscope.  Lymph node biopsy. Some lymph node tissue is removed to be checked under a microscope.  Other types of blood tests or imaging tests. How is this treated? Treatment of leukopenia depends on the cause. Some common treatments include:  Antibiotic medicine to treat bacterial infections.  Stopping medicines that may cause leukopenia.  Medicines to stimulate neutrophil production (hematopoietic growth factors), to treat neutropenia. Follow these instructions at home:  Take over-the-counter and prescription medicines only as told by your health care provider. This includes supplements and vitamins.  If you were prescribed an antibiotic medicine, take it as told by your health care provider. Do not stop taking the antibiotic even if you start to feel better.  Preventing infection is important if you have leukopenia. To prevent infection:  Avoid close contact with sick people.  Wash your hands frequently with soap and water. If soap and water are not available, use hand sanitizer.  Do not&nbsp;eat uncooked or undercooked meats.  Wash fruits and vegetables before eating them.  Do not eat or drink unpasteurized dairy products.  Get regular dental care, and maintain good dental hygiene. You should visit the dentist at least once every 6 months.  Keep all follow-up visits as told by your health care provider. This is important. Contact  a health care provider if:  You have chills or a fever.  You have symptoms of an infection. Get help right away if:  You have a fever that lasts for more than 2-3  days.  You have symptoms that last for more than 2-3 days.  You have trouble breathing.  You have chest pain. This information is not intended to replace advice given to you by your health care provider. Make sure you discuss any questions you have with your health care provider. Document Released: 02/06/2013 Document Revised: 12/23/2015 Document Reviewed: 12/23/2015 Elsevier Interactive Patient Education  2017 [employer].

## 2016-04-30 NOTE — Telephone Encounter (Signed)
Appts made per 04/30/16 los. Patient is in treatment area and will get Schedule and AVS there per Francoise Schaumann

## 2016-05-01 ENCOUNTER — Ambulatory Visit (HOSPITAL_BASED_OUTPATIENT_CLINIC_OR_DEPARTMENT_OTHER): Payer: 59

## 2016-05-01 VITALS — BP 106/67 | HR 69 | Temp 98.0°F | Resp 16 | Ht 63.0 in

## 2016-05-01 DIAGNOSIS — C762 Malignant neoplasm of abdomen: Secondary | ICD-10-CM | POA: Diagnosis not present

## 2016-05-01 DIAGNOSIS — D701 Agranulocytosis secondary to cancer chemotherapy: Secondary | ICD-10-CM

## 2016-05-01 DIAGNOSIS — T451X5A Adverse effect of antineoplastic and immunosuppressive drugs, initial encounter: Principal | ICD-10-CM

## 2016-05-01 LAB — CA 125: Cancer Antigen (CA) 125: 40.4 U/mL — ABNORMAL HIGH (ref 0.0–38.1)

## 2016-05-01 MED ORDER — FILGRASTIM 300 MCG/0.5ML IJ SOSY
300.0000 ug | PREFILLED_SYRINGE | Freq: Once | INTRAMUSCULAR | Status: AC
  Administered 2016-05-01: 300 ug via SUBCUTANEOUS

## 2016-05-01 NOTE — Patient Instructions (Signed)
Tbo-Filgrastim injection What is this medicine? TBO-FILGRASTIM (T B O fil GRA stim) is a granulocyte colony-stimulating factor that stimulates the growth of neutrophils, a type of white blood cell important in the body's fight against infection. It is used to reduce the incidence of fever and infection in patients with certain types of cancer who are receiving chemotherapy that affects the bone marrow. This medicine may be used for other purposes; ask your health care provider or pharmacist if you have questions. COMMON BRAND NAME(S): Granix What should I tell my health care provider before I take this medicine? They need to know if you have any of these conditions: -bone scan or tests planned -kidney disease -sickle cell anemia -an unusual or allergic reaction to tbo-filgrastim, filgrastim, pegfilgrastim, other medicines, foods, dyes, or preservatives -pregnant or trying to get pregnant -breast-feeding How should I use this medicine? This medicine is for injection under the skin. If you get this medicine at home, you will be taught how to prepare and give this medicine. Refer to the Instructions for Use that come with your medication packaging. Use exactly as directed. Take your medicine at regular intervals. Do not take your medicine more often than directed. It is important that you put your used needles and syringes in a special sharps container. Do not put them in a trash can. If you do not have a sharps container, call your pharmacist or healthcare provider to get one. Talk to your pediatrician regarding the use of this medicine in children. Special care may be needed. Overdosage: If you think you have taken too much of this medicine contact a poison control center or emergency room at once. NOTE: This medicine is only for you. Do not share this medicine with others. What if I miss a dose? It is important not to miss your dose. Call your doctor or health care professional if you miss a  dose. What may interact with this medicine? This medicine may interact with the following medications: -medicines that may cause a release of neutrophils, such as lithium This list may not describe all possible interactions. Give your health care provider a list of all the medicines, herbs, non-prescription drugs, or dietary supplements you use. Also tell them if you smoke, drink alcohol, or use illegal drugs. Some items may interact with your medicine. What should I watch for while using this medicine? You may need blood work done while you are taking this medicine. What side effects may I notice from receiving this medicine? Side effects that you should report to your doctor or health care professional as soon as possible: -allergic reactions like skin rash, itching or hives, swelling of the face, lips, or tongue -blood in the urine -dark urine -dizziness -fast heartbeat -feeling faint -shortness of breath or breathing problems -signs and symptoms of infection like fever or chills; cough; or sore throat -signs and symptoms of kidney injury like trouble passing urine or change in the amount of urine -stomach or side pain, or pain at the shoulder -sweating -swelling of the legs, ankles, or abdomen -tiredness Side effects that usually do not require medical attention (report to your doctor or health care professional if they continue or are bothersome): -bone pain -headache -muscle pain -vomiting This list may not describe all possible side effects. Call your doctor for medical advice about side effects. You may report side effects to FDA at 1-800-FDA-1088. Where should I keep my medicine? Keep out of the reach of children. Store in a refrigerator between   2 and 8 degrees C (36 and 46 degrees F). Keep in carton to protect from light. Throw away this medicine if it is left out of the refrigerator for more than 5 consecutive days. Throw away any unused medicine after the expiration  date. NOTE: This sheet is a summary. It may not cover all possible information. If you have questions about this medicine, talk to your doctor, pharmacist, or health care provider.  2018 Elsevier/Gold Standard (2015-03-24 19:07:04)  

## 2016-05-03 ENCOUNTER — Ambulatory Visit (HOSPITAL_BASED_OUTPATIENT_CLINIC_OR_DEPARTMENT_OTHER): Payer: 59

## 2016-05-03 ENCOUNTER — Encounter: Payer: Self-pay | Admitting: Physical Therapy

## 2016-05-03 ENCOUNTER — Ambulatory Visit: Payer: 59 | Admitting: Physical Therapy

## 2016-05-03 VITALS — BP 105/59 | HR 58 | Temp 97.9°F | Resp 16

## 2016-05-03 DIAGNOSIS — C762 Malignant neoplasm of abdomen: Secondary | ICD-10-CM

## 2016-05-03 DIAGNOSIS — M62838 Other muscle spasm: Secondary | ICD-10-CM

## 2016-05-03 DIAGNOSIS — M6281 Muscle weakness (generalized): Secondary | ICD-10-CM

## 2016-05-03 DIAGNOSIS — D701 Agranulocytosis secondary to cancer chemotherapy: Secondary | ICD-10-CM | POA: Diagnosis not present

## 2016-05-03 DIAGNOSIS — T451X5A Adverse effect of antineoplastic and immunosuppressive drugs, initial encounter: Principal | ICD-10-CM

## 2016-05-03 DIAGNOSIS — C5701 Malignant neoplasm of right fallopian tube: Secondary | ICD-10-CM | POA: Diagnosis not present

## 2016-05-03 MED ORDER — FILGRASTIM 300 MCG/0.5ML IJ SOSY: 300.0000 ug | PREFILLED_SYRINGE | Freq: Once | INTRAMUSCULAR | Status: DC

## 2016-05-03 MED ORDER — TBO-FILGRASTIM 300 MCG/0.5ML ~~LOC~~ SOSY
300.0000 ug | PREFILLED_SYRINGE | Freq: Once | SUBCUTANEOUS | Status: AC
  Administered 2016-05-03: 300 ug via SUBCUTANEOUS
  Filled 2016-05-03: qty 0.5

## 2016-05-03 NOTE — Therapy (Signed)
[facility] [address] [place], [place], [address] Phone: [phone]   Fax:  [phone]  Physical Therapy Treatment  Patient Details  Name: Kathryn Ramsey MRN: [id number] Date of Birth: 1965-12-16 Referring Provider: Dr. Lurline Del  Encounter Date: 05/03/2016      PT End of Session - 05/03/16 1707    Visit Number 4   Date for PT Re-Evaluation 08/06/16   PT Start Time 1615   PT Stop Time 1700   PT Time Calculation (min) 45 min   Activity Tolerance Patient tolerated treatment well   Behavior During Therapy [facility] for tasks assessed/performed      Past Medical History:  Diagnosis Date  . Acute sensory neuropathy ([place]) 06/26/2015  . Allergy   . Anemia   . Asthma    illness induced asthma  . Blood transfusion without reported diagnosis    at age 89 years old D/T surgery to femur being crushed  . 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 Heart murmur    pt, states had a "working heart murmur"  . History of kidney stones   . PONV (postoperative nausea and vomiting)   . Skin cancer     Past Surgical History:  Procedure Laterality Date  . 3 laporscopic proceedures    . ABDOMINAL HYSTERECTOMY     2010  . CHOLECYSTECTOMY     2010  . DILATION AND CURETTAGE OF UTERUS     1997  . LAPAROSCOPY N/A 03/27/2015   Procedure: DIAGNOSTIC LAPAROSCOPY ;  Surgeon: [name], MD;  Location: [facility];  Service: Gynecology;  Laterality: N/A;  . LAPAROSCOPY N/A 02/24/2016   Procedure: LAPAROSCOPY DIAGNOSTIC WITH PERITONEAL WASHINGS AND PERITONEAL BIOPSY;  Surgeon: [name], MD;  Location: [facility];  Service: Gynecology;  Laterality: N/A;  . LAPAROTOMY N/A 03/27/2015   Procedure: EXPLORATORY LAPAROTOMY, BILATERAL SALPINGO OOPHORECTOMY, OMENTECTOMY, RADICAL TUMOR DEBULKING;  Surgeon: [name], MD;  Location: [facility];  Service: Gynecology;  Laterality: N/A;  .  sinus surgery 1994      There were no vitals filed for this visit.      Subjective Assessment - 05/03/16 1621    Subjective My Nutrafill was low so I did not have chemotherapy last week. I felt great last week.  The bladder is improving. I am emptying bladder fully.  I am still constipated. I have not had intercourse yet.  MD reports the port sites  are metatasis.    Patient Stated Goals improve pain with intercourse   Currently in Pain? Yes   Pain Score 2    Pain Location Abdomen   Pain Orientation Lower   Pain Descriptors / Indicators Tightness;Discomfort   Pain Type Acute pain   Pain Onset 1 to 4 weeks ago   Pain Frequency Intermittent   Aggravating Factors  end of urination   Pain Relieving Factors massage abdomen   Multiple Pain Sites No                         [facility] Adult PT Treatment/Exercise - 05/03/16 0001      Self-Care   Self-Care Other Self-Care Comments   Other Self-Care Comments  dicusssed using a dilator, using vitamin E on Vulva area, asking MD for a dilator book on Working with sex and pain     Manual Therapy   Manual Therapy  Myofascial release;Internal Pelvic Floor   Myofascial Release release of the lower urogential dipahgram; release of the bladder with one hand on lower abdomen and other internally on side of bladder; release of the diaphgram   Internal Pelvic Floor bil. sides of urethra and post. intoitus                PT Education - 05/03/16 1706    Education provided Yes   Education Details information on book with sex pain, using a lubricator with lidocaine, happy baby stretch   Person(s) Educated Patient   Methods Explanation;Demonstration;Handout   Comprehension Returned demonstration;Verbalized understanding          PT Short Term Goals - 04/21/16 0850      PT SHORT TERM GOAL #1   Title independent with scar massage to improve tissue mobility   Time 4   Period Weeks   Status Achieved     PT SHORT TERM GOAL #2    Title understand how to perform abdominal massage to promote peristalic mobility to improve constipation   Time 4   Period Weeks   Status Achieved     PT SHORT TERM GOAL #3   Title understand how to toilet correctly to reduce strain on pelvic floor and ability to relax the pelvic floor    Time 4   Period Weeks   Status Achieved     PT SHORT TERM GOAL #4   Title ability to perfrom a pelvic floor contraction with a lift due to pelvic floor tone decreased and muscle able to fully contract   Time 4   Period Weeks   Status On-going     PT SHORT TERM GOAL #5   Title understand how to perform self perineal massage to elongate pelvic floor muscles for relaxation   Time 4   Period Weeks   Status Achieved           PT Long Term Goals - 04/08/16 1335      PT LONG TERM GOAL #1   Title independent with HEP   Time 4   Period Months   Status New     PT LONG TERM GOAL #2   Title reduction of bladder pressure when urinating >/= 80% due to increased mobility of bladder and abdominal tissue   Time 4   Period Months   Status New     PT LONG TERM GOAL #3   Title pain with penile penetration decreased >/= 75% so Marinoff scale is 1/3   Time 4   Period Months   Status New     PT LONG TERM GOAL #4   Title mobility of abdominal scars improved >/= 75% so patient is able to contract lower abdominals correctly with correct toileting technique   Time 4   Period Months   Status New     PT LONG TERM GOAL #5   Title understand how to relax the pelvic floor muscles during penile penetracton and vaginal exam to 2-4uv   Time 4   Period Months   Status New     Additional Long Term Goals   Additional Long Term Goals Yes               Plan - 05/03/16 1708    Clinical Impression Statement Patient is able to empty her bladder fully.  Patient is still nervous with having intecourse due to pain.  Patient is doing internal soft tissue work but will benefit from an internal dilator.  Patient was not as tender with internal soft tissue work.  Patient port sites are smaller. Patient will benefit from skilled therapy to reduce pain, increase pelvic floor mobility and increase strength.    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; presently receiving chemotherapy 1x/month;    PT Frequency 2x / week   PT Duration Other (comment)  4 months   PT Treatment/Interventions Biofeedback;Moist Heat;Therapeutic activities;Therapeutic exercise;Neuromuscular re-education;Patient/family education;Scar mobilization;Manual techniques;Dry needling;Manual lymph drainage;Taping;Cryotherapy;Electrical Stimulation   PT Next Visit Plan soft tissue work to pelvic floor area; joint mobilization to L4-S1;  hip stretches; information on Awakeings   PT Home Exercise Plan hip stretches   Consulted and Agree with Plan of Care Patient      Patient will benefit from skilled therapeutic intervention in order to improve the following deficits and impairments:  Pain, Decreased mobility, Decreased strength, Decreased activity tolerance, Decreased coordination, Decreased range of motion, Increased fascial restricitons, Impaired tone, Increased muscle spasms, Decreased scar mobility  Visit Diagnosis: Other muscle spasm  Muscle weakness (generalized)     Problem List Patient Active Problem List   Diagnosis Date Noted  . Lichen sclerosus et atrophicus of the vulva 03/31/2016  . Abdominal wall mass of right lower quadrant 03/08/2016  . 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 Goals of care, counseling/discussion 02/27/2016  . Elevated CA-125 02/24/2016  . Vaginal dryness, menopausal 10/01/2015  . Drug-induced peripheral neuropathy ([place]) 09/02/2015  . Leukopenia due to antineoplastic chemotherapy ([place]) 09/02/2015  . Chemotherapy-induced neuropathy ([place]) 08/19/2015  . Cellulitis 07/03/2015  . Acute sensory neuropathy ([place])  06/26/2015  . High risk medication use 06/21/2015  . Facial paresthesia 06/18/2015  . Genetic testing 06/16/2015  . Restless legs 05/14/2015  . Family history of breast cancer in female 04/29/2015  . Chemotherapy-induced nausea 04/22/2015  . Chemotherapy induced neutropenia ([place]) 04/22/2015  . Cancer of right fallopian tube ([place]) 04/07/2015  . History of hysterectomy for benign disease 03/21/2015  . IBS (irritable bowel syndrome) 03/21/2015  . Dense breast tissue 03/21/2015  . History of cholecystectomy 03/21/2015  . 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 [place], [place], [address] Phone: [phone]   Fax:  [phone]  Name: Aldene Hendon MRN: [id number] Date of Birth: December 09, 1965

## 2016-05-03 NOTE — Patient Instructions (Addendum)
Sex Without Pain: A Self-Treatment Guide To The Sex Life You Deserve Kindle Edition  by       Zigmund Gottron DPT (Author)       1. Position yourself as shown, grabbing onto the feet or behind the knees; you should feel a gentle stretch.  2. Breathe in and allow the pelvic floor muscles to relax.  3. Hold this position for 2-3 minutes.  [place] [employer].   [facility] [address], [place], [place] [address] Phone # [phone] Fax [phone]

## 2016-05-05 ENCOUNTER — Encounter: Payer: Self-pay | Admitting: Physical Therapy

## 2016-05-05 ENCOUNTER — Ambulatory Visit: Payer: 59 | Admitting: Physical Therapy

## 2016-05-05 DIAGNOSIS — M6281 Muscle weakness (generalized): Secondary | ICD-10-CM

## 2016-05-05 DIAGNOSIS — M62838 Other muscle spasm: Secondary | ICD-10-CM

## 2016-05-05 NOTE — Therapy (Signed)
[facility] [address], [place], [place], [address] Phone: [phone]   Fax:  [phone]  Physical Therapy Treatment  Patient Details  Name: Kathryn Ramsey MRN: [id number] Date of Birth: 1965-07-04 Referring Provider: Dr. Lurline Del  Encounter Date: 05/05/2016      PT End of Session - 05/05/16 1151    Visit Number 5   Date for PT Re-Evaluation 08/06/16   PT Start Time 1109   PT Stop Time 1150   PT Time Calculation (min) 41 min   Activity Tolerance Patient tolerated treatment well   Behavior During Therapy [facility] for tasks assessed/performed      Past Medical History:  Diagnosis Date  . Acute sensory neuropathy ([place]) 06/26/2015  . Allergy   . Anemia   . Asthma    illness induced asthma  . Blood transfusion without reported diagnosis    at age 51 years old D/T surgery to femur being crushed  . Complication of anesthesia    hx. of allergic to ether (had surgery at 51 years and had reaction to the ether  . Heart murmur    pt, states had a "working heart murmur"  . History of kidney stones   . PONV (postoperative nausea and vomiting)   . Skin cancer     Past Surgical History:  Procedure Laterality Date  . 3 laporscopic proceedures    . ABDOMINAL HYSTERECTOMY     2010  . CHOLECYSTECTOMY     2010  . DILATION AND CURETTAGE OF UTERUS     1997  . LAPAROSCOPY N/A 03/27/2015   Procedure: DIAGNOSTIC LAPAROSCOPY ;  Surgeon: [name], MD;  [facility]: [facility];  Service: Gynecology;  Laterality: N/A;  . LAPAROSCOPY N/A 02/24/2016   Procedure: LAPAROSCOPY DIAGNOSTIC WITH PERITONEAL WASHINGS AND PERITONEAL BIOPSY;  Surgeon: [name], MD;  [facility];  Service: Gynecology;  Laterality: N/A;  . LAPAROTOMY N/A 03/27/2015   Procedure: EXPLORATORY LAPAROTOMY, BILATERAL SALPINGO OOPHORECTOMY, OMENTECTOMY, RADICAL TUMOR DEBULKING;  Surgeon: [name], MD;  [facility];  Service: Gynecology;  Laterality: N/A;  .  sinus surgery 1994      There were no vitals filed for this visit.      Subjective Assessment - 05/05/16 1111    Subjective I have bad intestinial ache. I am eating plant based diet. Side effect of a medicine but not sure.    Patient Stated Goals improve pain with intercourse   Currently in Pain? Yes   Pain Score 1    Pain Location Abdomen   Pain Orientation Anterior   Pain Descriptors / Indicators Aching   Pain Type Acute pain   Pain Onset 1 to 4 weeks ago   Pain Frequency Intermittent   Aggravating Factors  not sure   Pain Relieving Factors not sure   Multiple Pain Sites No                         [facility] Adult PT Treatment/Exercise - 05/05/16 0001      Manual Therapy   Manual Therapy Myofascial release;Manual Lymphatic Drainage (MLD)   Myofascial Release release of respiratory diaphgram and urogenital diahragam;   Manual Lymphatic Drainage (MLD) triangle movement of hands on abdomen and stimulation of colonic movement                PT Education - 05/05/16 1151    Education provided Yes   Education Details yoga poses; MLD of abdomen  Person(s) Educated Patient   Methods Explanation;Demonstration;Verbal cues;Handout   Comprehension Returned demonstration;Verbalized understanding          PT Short Term Goals - 05/05/16 1155      PT SHORT TERM GOAL #1   Title independent with scar massage to improve tissue mobility   Time 4   Period Weeks   Status Achieved     PT SHORT TERM GOAL #2   Title understand how to perform abdominal massage to promote peristalic mobility to improve constipation   Time 4   Period Weeks   Status Achieved     PT SHORT TERM GOAL #3   Title understand how to toilet correctly to reduce strain on pelvic floor and ability to relax the pelvic floor    Time 4   Period Weeks   Status Achieved     PT SHORT TERM GOAL #4   Title ability to perfrom a pelvic floor contraction with a lift due to pelvic floor tone  decreased and muscle able to fully contract   Time 4   Period Weeks   Status On-going     PT SHORT TERM GOAL #5   Title understand how to perform self perineal massage to elongate pelvic floor muscles for relaxation   Time 4   Period Weeks   Status Achieved           PT Long Term Goals - 04/08/16 1335      PT LONG TERM GOAL #1   Title independent with HEP   Time 4   Period Months   Status New     PT LONG TERM GOAL #2   Title reduction of bladder pressure when urinating >/= 80% due to increased mobility of bladder and abdominal tissue   Time 4   Period Months   Status New     PT LONG TERM GOAL #3   Title pain with penile penetration decreased >/= 75% so Marinoff scale is 1/3   Time 4   Period Months   Status New     PT LONG TERM GOAL #4   Title mobility of abdominal scars improved >/= 75% so patient is able to contract lower abdominals correctly with correct toileting technique   Time 4   Period Months   Status New     PT LONG TERM GOAL #5   Title understand how to relax the pelvic floor muscles during penile penetracton and vaginal exam to 2-4uv   Time 4   Period Months   Status New     Additional Long Term Goals   Additional Long Term Goals Yes               Plan - 05/05/16 1151    Clinical Impression Statement Port sites on lateral right abdomen looks like they have decreased in size.  After myofascial release patient had decreased swelling of abdomen and good bowel sounds.  Patient has pulling with cat/cow movement in the abdomen.  Patient is performing her exercise.  Patient has talked to her husband about discussing the fear of intercourse due to pain and they are going to talk further.  Patient will benefit from skilled therapy to reduce pain, increase pelvis floor mobility and increase strength.    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; presently receiving chemotherapy  1x/month;    PT Frequency 2x / week   PT Duration Other (comment)  4 months  PT Treatment/Interventions Biofeedback;Moist Heat;Therapeutic activities;Therapeutic exercise;Neuromuscular re-education;Patient/family education;Scar mobilization;Manual techniques;Dry needling;Manual lymph drainage;Taping;Cryotherapy;Electrical Stimulation   PT Next Visit Plan internal soft tissue work to pelvic floor area; joint mobilization to L4-S1;  [place] pelvis alignment   PT Home Exercise Plan review HEP to see if all of it is consolidated   Consulted and Agree with Plan of Care Patient      Patient will benefit from skilled therapeutic intervention in order to improve the following deficits and impairments:     Visit Diagnosis: Other muscle spasm  Muscle weakness (generalized)     Problem List Patient Active Problem List   Diagnosis Date Noted  . Lichen sclerosus et atrophicus of the vulva 03/31/2016  . Abdominal wall mass of right lower quadrant 03/08/2016  . 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 Goals of care, counseling/discussion 02/27/2016  . Elevated CA-125 02/24/2016  . Vaginal dryness, menopausal 10/01/2015  . Drug-induced peripheral neuropathy ([place]) 09/02/2015  . Leukopenia due to antineoplastic chemotherapy ([place]) 09/02/2015  . Chemotherapy-induced neuropathy ([place]) 08/19/2015  . Cellulitis 07/03/2015  . Acute sensory neuropathy ([place]) 06/26/2015  . High risk medication use 06/21/2015  . Facial paresthesia 06/18/2015  . Genetic testing 06/16/2015  . Restless legs 05/14/2015  . Family history of breast cancer in female 04/29/2015  . Chemotherapy-induced nausea 04/22/2015  . Chemotherapy induced neutropenia ([place]) 04/22/2015  . Cancer of right fallopian tube ([place]) 04/07/2015  . History of hysterectomy for benign disease 03/21/2015  . IBS (irritable bowel syndrome) 03/21/2015  . Dense breast tissue 03/21/2015  . History of cholecystectomy 03/21/2015  . Abdominal  carcinomatosis ([place]) 03/21/2015    Earlie Counts, PT 05/05/16 11:57 AM   [facility] [address] [place], [place], [address] Phone: [phone]   Fax:  [phone]  Name: Loxley Cibrian MRN: [id number] Date of Birth: August 29, 1965

## 2016-05-05 NOTE — Patient Instructions (Signed)
   1.  Begin facing downward on your hands and knees with both your hands about 6 inches apart while your knees and feet are about 12-18 inches apart. 2.  Squeeze your knees together to activate your inner hip muscles. 3.  Maintain this activation while you rock back toward your heels while maintaining position with your hands allowing the hips and back to flex forward while the arms are stretched overhead.    1.  Begin lying face down with the hands tight to the sides and propped up on your toes. 2.  Engage the abdominals before pressing up with the arms into a spine extension stretch as shown in the picture.   3.  This position is maintained up on the toes with the legs not in contact with the ground. 4.  Maintain this end range position for the prescribed time.  Cat / Cow Flow    Inhale, press spine toward ceiling like a Halloween cat. Keeping strength in arms and abdominals, exhale to soften spine through neutral and into cow pose. Open chest and arch back. Initiate movement between cat and cow at tailbone, one vertebrae at a time. Repeat 10____ times.  Copyright  VHI. All rights reserved.  Sun Salutation (Modified) (2 of 3)    Exhale, place hands on floor next to feet and step back, right then left, to hands and knees position. Inhale and hold, then exhale, bending arms close to sides, lowering chest one inch from floor. Inhale, pulling body forward to flatten front of hips on floor, raise torso in supported arch. Exhale, lifting hips and torso into inverted V (Down Dog) and press heels toward floor.   http://yg.exer.us/201   Copyright  VHI. All rights reserved.  [facility] [address], [place], [place] [address] Phone # [phone] Fax [phone]

## 2016-05-07 ENCOUNTER — Other Ambulatory Visit: Payer: Self-pay | Admitting: Emergency Medicine

## 2016-05-07 ENCOUNTER — Ambulatory Visit (HOSPITAL_BASED_OUTPATIENT_CLINIC_OR_DEPARTMENT_OTHER): Payer: 59

## 2016-05-07 ENCOUNTER — Other Ambulatory Visit: Payer: 59

## 2016-05-07 ENCOUNTER — Ambulatory Visit (HOSPITAL_BASED_OUTPATIENT_CLINIC_OR_DEPARTMENT_OTHER): Payer: 59 | Admitting: Oncology

## 2016-05-07 ENCOUNTER — Other Ambulatory Visit (HOSPITAL_BASED_OUTPATIENT_CLINIC_OR_DEPARTMENT_OTHER): Payer: 59

## 2016-05-07 VITALS — BP 116/71 | HR 62 | Temp 97.4°F | Resp 18 | Ht 63.0 in | Wt 119.5 lb

## 2016-05-07 DIAGNOSIS — C801 Malignant (primary) neoplasm, unspecified: Secondary | ICD-10-CM

## 2016-05-07 DIAGNOSIS — C5701 Malignant neoplasm of right fallopian tube: Secondary | ICD-10-CM

## 2016-05-07 DIAGNOSIS — Z5111 Encounter for antineoplastic chemotherapy: Secondary | ICD-10-CM

## 2016-05-07 DIAGNOSIS — Z5189 Encounter for other specified aftercare: Secondary | ICD-10-CM | POA: Diagnosis not present

## 2016-05-07 DIAGNOSIS — C762 Malignant neoplasm of abdomen: Secondary | ICD-10-CM

## 2016-05-07 DIAGNOSIS — C786 Secondary malignant neoplasm of retroperitoneum and peritoneum: Secondary | ICD-10-CM

## 2016-05-07 LAB — CBC WITH DIFFERENTIAL/PLATELET
BASO%: 0.2 % (ref 0.0–2.0)
Basophils Absolute: 0 10*3/uL (ref 0.0–0.1)
EOS%: 0.2 % (ref 0.0–7.0)
Eosinophils Absolute: 0 10*3/uL (ref 0.0–0.5)
HCT: 39.2 % (ref 34.8–46.6)
HGB: 13.2 g/dL (ref 11.6–15.9)
LYMPH%: 53.8 % — ABNORMAL HIGH (ref 14.0–49.7)
MCH: 32.6 pg (ref 25.1–34.0)
MCHC: 33.7 g/dL (ref 31.5–36.0)
MCV: 96.8 fL (ref 79.5–101.0)
MONO#: 0.6 10*3/uL (ref 0.1–0.9)
MONO%: 13.3 % (ref 0.0–14.0)
NEUT#: 1.4 10*3/uL — ABNORMAL LOW (ref 1.5–6.5)
NEUT%: 32.5 % — ABNORMAL LOW (ref 38.4–76.8)
Platelets: 291 10*3/uL (ref 145–400)
RBC: 4.05 10*6/uL (ref 3.70–5.45)
RDW: 16.6 % — ABNORMAL HIGH (ref 11.2–14.5)
WBC: 4.4 10*3/uL (ref 3.9–10.3)
lymph#: 2.4 10*3/uL (ref 0.9–3.3)

## 2016-05-07 LAB — COMPREHENSIVE METABOLIC PANEL
ALT: 23 U/L (ref 0–55)
AST: 25 U/L (ref 5–34)
Albumin: 4.6 g/dL (ref 3.5–5.0)
Alkaline Phosphatase: 89 U/L (ref 40–150)
Anion Gap: 11 mEq/L (ref 3–11)
BUN: 6.5 mg/dL — ABNORMAL LOW (ref 7.0–26.0)
CO2: 26 mEq/L (ref 22–29)
Calcium: 10.2 mg/dL (ref 8.4–10.4)
Chloride: 102 mEq/L (ref 98–109)
Creatinine: 0.8 mg/dL (ref 0.6–1.1)
EGFR: 90 mL/min/{1.73_m2} — ABNORMAL LOW (ref >90)
Glucose: 90 mg/dl (ref 70–140)
Potassium: 4.4 mEq/L (ref 3.5–5.1)
Sodium: 139 mEq/L (ref 136–145)
Total Bilirubin: 0.46 mg/dL (ref 0.20–1.20)
Total Protein: 7.8 g/dL (ref 6.4–8.3)

## 2016-05-07 MED ORDER — PEGFILGRASTIM 6 MG/0.6ML ~~LOC~~ PSKT
6.0000 mg | PREFILLED_SYRINGE | Freq: Once | SUBCUTANEOUS | Status: AC
  Administered 2016-05-07: 6 mg via SUBCUTANEOUS
  Filled 2016-05-07: qty 0.6

## 2016-05-07 MED ORDER — TRAMADOL HCL 50 MG PO TABS: 50.0000 mg | ORAL_TABLET | Freq: Four times a day (QID) | ORAL | 2 refills | Status: DC | PRN

## 2016-05-07 MED ORDER — LORAZEPAM 2 MG/ML IJ SOLN
INTRAMUSCULAR | Status: AC
  Filled 2016-05-07: qty 1

## 2016-05-07 MED ORDER — DEXAMETHASONE SODIUM PHOSPHATE 10 MG/ML IJ SOLN
INTRAMUSCULAR | Status: AC
  Filled 2016-05-07: qty 1

## 2016-05-07 MED ORDER — DEXTROSE 5 % IV SOLN
Freq: Once | INTRAVENOUS | Status: AC
  Administered 2016-05-07: 10:00:00 via INTRAVENOUS

## 2016-05-07 MED ORDER — SODIUM CHLORIDE 0.9 % IV SOLN
478.0000 mg | Freq: Once | INTRAVENOUS | Status: AC
  Administered 2016-05-07: 480 mg via INTRAVENOUS
  Filled 2016-05-07: qty 48

## 2016-05-07 MED ORDER — PALONOSETRON HCL INJECTION 0.25 MG/5ML
INTRAVENOUS | Status: AC
  Filled 2016-05-07: qty 5

## 2016-05-07 MED ORDER — DOXORUBICIN HCL LIPOSOMAL CHEMO INJECTION 2 MG/ML
30.0000 mg/m2 | Freq: Once | INTRAVENOUS | Status: AC
  Administered 2016-05-07: 46 mg via INTRAVENOUS
  Filled 2016-05-07: qty 23

## 2016-05-07 MED ORDER — CARBOPLATIN CHEMO INTRADERMAL TEST DOSE 100MCG/0.02ML
100.0000 ug | Freq: Once | INTRADERMAL | Status: AC
  Administered 2016-05-07: 100 ug via INTRADERMAL
  Filled 2016-05-07: qty 0.02

## 2016-05-07 MED ORDER — LORAZEPAM 2 MG/ML IJ SOLN
0.5000 mg | Freq: Once | INTRAMUSCULAR | Status: AC
  Administered 2016-05-07: 0.5 mg via INTRAVENOUS

## 2016-05-07 MED ORDER — PALONOSETRON HCL INJECTION 0.25 MG/5ML
0.2500 mg | Freq: Once | INTRAVENOUS | Status: AC
  Administered 2016-05-07: 0.25 mg via INTRAVENOUS

## 2016-05-07 MED ORDER — DEXAMETHASONE SODIUM PHOSPHATE 10 MG/ML IJ SOLN
10.0000 mg | Freq: Once | INTRAMUSCULAR | Status: AC
  Administered 2016-05-07: 10 mg via INTRAVENOUS

## 2016-05-07 NOTE — Progress Notes (Signed)
[facility]  [phone] Fax:([phone]) [phone]     ID: Kathryn Ramsey DOB: [birth year]  MR#: [id number]  VQX#:[id number]  Patient Care Team: Harlan Stains, MD as PCP - General (Family Medicine) Gordy Levan, MD as Consulting Physician (Oncology) [name], MD as Consulting Physician (Obstetrics and Gynecology) Juanita Craver, MD as Consulting Physician (Gastroenterology) Servando Salina, MD as Consulting Physician (Obstetrics and Gynecology) Nickie Retort, MD as Consulting Physician (Urology) Chauncey Cruel, MD OTHER MD:  CHIEF COMPLAINT: Recurrent ovarian cancer  CURRENT TREATMENT: Carboplatin, liposomal doxorubicin   HISTORY OF PRESENT ILLNESS: From Dr. Serita Grit original intake note 03/17/2015:  "Kathryn Ramsey is a very pleasant G4P4 who is seen in consultation at the request of Dr Collene Mares and Dr Garwin Brothers for peritoneal carcinomatosis. The patient has a history of a workup by urology for gross hematuria which included a pelvic examine was suggested for extrinsic mass effect on the bladder on cystoscopy. Cystoscopy took place on in December 2016. The patient denies abdominal pain bloating early satiety or abdominal distention.  On 08/03/2015 at [facility] she underwent a CT scan of the abdomen and pelvis as ordered by Dr Baruch Gouty. This revealed a 1.4 cm low attenuation lesion on the posterior right hepatic lobe suggestive of a hemangioma. Status post hysterectomy. No ovarian masses. Moderate ascites. Omental caking seen in the lateral left abdomen and pelvis. Peritoneal nodularity in the left lateral pelvic cul-de-sac. No gross extrinsic compression on the bladder was identified on imaging.  The patient was then seen by her gastroenterologist, Dr. Collene Mares, who performed a colonoscopy which was unremarkable.  Tumor markers were drawn on 08/04/2015 and these included a CA-125 that was elevated to 957, and a CEA that was normal at 1."  On 03/27/2015 the  patient underwent diagnostic laparoscopy, exploratory laparotomy with bilateral salpingo-oophorectomy, omentectomy, and radical tumor debulking with optimal cytoreduction (R0) of what proved to be a stage IIIc right fallopian tube cancer. She was treated adjuvantly with carboplatin and paclitaxel, as detailed below. Her CA 125 dropped from 1226 on 03/20/2015 to 26.1 by 06/16/2015.  Her subsequent history is as detailed below.  INTERVAL HISTORY: Kathryn Ramsey for follow-up of her recurrent fallopian tube adenocarcinoma with carcinomatosis, accompanied by her husband. She is due for her third cycle of carboplatin and Doxil, which she receives every 28 days. We have added on Pro because she was neutropenic last week this is only treatment   REVIEW OF SYSTEMS: Kathryn Ramsey has an excellent functional status, exercises through [place], yoga, and doing weights. Of course she recently came from a ski trip. She is having regular bowel movements which now are soft, with the use of stool softeners. She is benefiting from physical therapy through [facility]. The only complaint she has is bloating after meals and some tummy discomfort. This is going to be related to her carcinomatosis which at the heels can cause inflammation or scarring. A detailed review of systems Ramsey was otherwise stable  PAST MEDICAL HISTORY: Past Medical History:  Diagnosis Date  . Acute sensory neuropathy ([place]) 06/26/2015  . Allergy   . Anemia   . Asthma    illness induced asthma  . Blood transfusion without reported diagnosis    at age 19 years old D/T surgery to femur being crushed  . 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 Heart murmur    pt, states had a "working  heart murmur"  . History of kidney stones   . PONV (postoperative nausea and vomiting)   . Skin cancer     PAST SURGICAL HISTORY: Past Surgical History:  Procedure Laterality Date    . 3 laporscopic proceedures    . ABDOMINAL HYSTERECTOMY     2010  . CHOLECYSTECTOMY     2010  . DILATION AND CURETTAGE OF UTERUS     1997  . LAPAROSCOPY N/A 03/27/2015   Procedure: DIAGNOSTIC LAPAROSCOPY ;  Surgeon: [name], MD;  Location: [facility];  Service: Gynecology;  Laterality: N/A;  . LAPAROSCOPY N/A 02/24/2016   Procedure: LAPAROSCOPY DIAGNOSTIC WITH PERITONEAL WASHINGS AND PERITONEAL BIOPSY;  Surgeon: [name], MD;  Location: [facility];  Service: Gynecology;  Laterality: N/A;  . LAPAROTOMY N/A 03/27/2015   Procedure: EXPLORATORY LAPAROTOMY, BILATERAL SALPINGO OOPHORECTOMY, OMENTECTOMY, RADICAL TUMOR DEBULKING;  Surgeon: [name], MD;  Location: [facility];  Service: Gynecology;  Laterality: N/A;  . sinus surgery 1994      FAMILY HISTORY Family History  Problem Relation Age of Onset  . Hypertension Father   . Skin cancer Father     nonmelanoma skin cancers in his late 20s  . Non-Hodgkin's lymphoma Maternal Aunt     dx. 5s; smoker  . Stroke Maternal Grandmother   . Other Mother     benign meningioma dx. early-mid-70s; hysterectomy in her late 53s for heavy periods - still has ovaries  . Other Son     one son with pre-cancerous skin findings  . Other Daughter     cysts on ovaries and hx of heavy periods  . Other Sister     hysterectomy for cysts - still has ovaries  . Heart attack Maternal Grandfather   . Heart attack Paternal Grandfather   . Renal cancer Maternal Aunt 60    smoker  . Breast cancer Maternal Aunt 78  . Other Maternal Aunt     dx. benign brain tumor (meningioma) at 1; 2nd benign brain tumor in her late 31s; hx of radical hysterectomy at age 65  . Brain cancer Other     NOS tumor  The patient's parents are both living, in their late 2s as of January 2018. The patient has one brother, 3 sisters. There is no history of ovarian cancer in the family. One maternal aunt had breast and kidney cancer, another had non-Hodgkin's lymphoma.  GYNECOLOGIC HISTORY:  No  LMP recorded. Patient has had a hysterectomy. 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 Her husband [name] is a Engineer, maintenance (IT). Their children are 21, 55, 46 and 43 y/o as of JAN 2018. The oldest works as an Electrical engineer in the [employer] in [place]. The other 3 children live in [place], the youngest currently attending [employer]. Rhe patient has no grandchildren. She attends [employer]    ADVANCED DIRECTIVES:    HEALTH MAINTENANCE: Social History  Substance Use Topics  . Smoking status: Never Smoker  . Smokeless tobacco: Never Used  . Alcohol use Yes     Comment:  3 glasses wine or beer/week     Colonoscopy:2016  PAP: s/p hyst  Bone density:   Allergies  Allergen Reactions  . Bee Venom Shortness Of Breath    swelling  . Sulfa Antibiotics Shortness Of Breath    Vomit , diarrhea , hives    Current Outpatient Prescriptions  Medication Sig Dispense  Refill  . B COMPLEX VITAMINS SL Place 1 tablet under the tongue daily.    . clidinium-chlordiazePOXIDE (LIBRAX) 5-2.5 MG capsule Take 1 capsule by mouth at bedtime as needed (IBS symptoms). Reported on 08/11/2015  0  . dexamethasone (DECADRON) 4 MG tablet Take 2 tablets (8 mg total) by mouth daily. Start the day after chemotherapy for 2 days. 30 tablet 1  . gabapentin (NEURONTIN) 100 MG capsule Take 1 capsule (100 mg total) by mouth at bedtime. Takes 147m at bedtime. Takes an additional 1072mduring the day if needed for nerve pain. 90 capsule 4  . ibuprofen (ADVIL,MOTRIN) 200 MG tablet Take 400 mg by mouth daily as needed for mild pain. Reported on 08/18/2015    . Lactobacillus (PROBIOTIC ACIDOPHILUS PO) Take 1 capsule by mouth every evening.    . Marland KitchenORazepam (ATIVAN) 0.5 MG tablet Take 0.5 mg by mouth at bedtime.    . Multiple Vitamin (MULTIVITAMIN WITH MINERALS) TABS tablet Take 1 tablet by mouth daily.    . ondansetron (ZOFRAN) 8  MG tablet Take 1 tablet (8 mg total) by mouth 2 (two) times daily as needed for refractory nausea / vomiting. Start on day 3 after chemo. 30 tablet 1  . senna (SENOKOT) 8.6 MG TABS tablet Take 1 tablet (8.6 mg total) by mouth at bedtime. 120 each 0  . TURMERIC PO Take 1 tablet by mouth daily.    . valACYclovir (VALTREX) 1000 MG tablet Take 1 tablet (1,000 mg total) by mouth 2 (two) times daily. 30 tablet 3   No current facility-administered medications for this visit.     OBJECTIVE: middle aged White woman In no acute distress Vitals:   05/07/16 0818  BP: 116/71  Pulse: 62  Resp: 18  Temp: 97.4 F (36.3 C)     Body mass index is 21.17 kg/m.    ECOG FS:1 - Symptomatic but completely ambulatory  Sclerae unicteric, EOMs intact Oropharynx clear and moist No cervical or supraclavicular adenopathy Lungs no rales or rhonchi Heart regular rate and rhythm Abd soft, nontender, positive bowel sounds MSK no focal spinal tenderness, no upper extremity lymphedema Neuro: nonfocal, well oriented, appropriate affect Breasts: Deferred Skin: The port site metastases are flatter, although still somewhat palpable, now up to the 1/2-1 cm range.  Photo of right lower quadrant port-site metastases 03/15/2016    Photo 04/02/2016    LAB RESULTS:  CMP     Component Value Date/Time   NA 138 04/30/2016 0833   K 3.8 04/30/2016 0833   CL 104 06/18/2015 1248   CO2 23 04/30/2016 0833   GLUCOSE 90 04/30/2016 0833   BUN 10.4 04/30/2016 0833   CREATININE 0.7 04/30/2016 0833   CALCIUM 9.9 04/30/2016 0833   PROT 7.9 04/30/2016 0833   ALBUMIN 4.4 04/30/2016 0833   AST 21 04/30/2016 0833   ALT 20 04/30/2016 0833   ALKPHOS 71 04/30/2016 0833   BILITOT 0.51 04/30/2016 0833   GFRNONAA >60 06/18/2015 1248   GFRAA >60 06/18/2015 1248    INo results found for: SPEP, UPEP  Lab Results  Component Value Date   WBC 2.7 (L) 04/30/2016   NEUTROABS 0.6 (L) 04/30/2016   HGB 12.3 04/30/2016   HCT 35.5  04/30/2016   MCV 94.7 04/30/2016   PLT 273 04/30/2016      Chemistry      Component Value Date/Time   NA 138 04/30/2016 0833   K 3.8 04/30/2016 0833   CL 104 06/18/2015 1248   CO2 23  04/30/2016 0833   BUN 10.4 04/30/2016 0833   CREATININE 0.7 04/30/2016 0833      Component Value Date/Time   CALCIUM 9.9 04/30/2016 0833   ALKPHOS 71 04/30/2016 0833   AST 21 04/30/2016 0833   ALT 20 04/30/2016 0833   BILITOT 0.51 04/30/2016 0833       No results found for: LABCA2  No components found for: LABCA125  No results for input(s): INR in the last 168 hours.  Urinalysis    Component Value Date/Time   COLORURINE YELLOW 03/29/2015 0920   APPEARANCEUR CLEAR 03/29/2015 0920   LABSPEC 1.005 10/09/2015 1239   PHURINE 6.0 10/09/2015 1239   PHURINE 7.5 03/29/2015 0920   GLUCOSEU Negative 10/09/2015 1239   HGBUR Negative 10/09/2015 1239   HGBUR SMALL (A) 03/29/2015 0920   BILIRUBINUR Negative 10/09/2015 1239   KETONESUR Negative 10/09/2015 1239   KETONESUR NEGATIVE 03/29/2015 0920   PROTEINUR Negative 10/09/2015 1239   PROTEINUR NEGATIVE 03/29/2015 0920   UROBILINOGEN 0.2 10/09/2015 1239   NITRITE Negative 10/09/2015 1239   NITRITE NEGATIVE 03/29/2015 0920   LEUKOCYTESUR Negative 10/09/2015 1239    Ref Range & Units 7d ago (04/30/16) 3wk ago (04/16/16) 33moago (04/02/16) 166mogo (03/15/16)    Cancer Antigen (CA) 125 0.0 - 38.1 U/mL 40.4   56.2CM   122.7CM   451.0CM    Comments     STUDIES[facility] and showed the breast density to be category C. There was no evidence of malignancy.   ELIGIBLE FOR AVAILABLE RESEARCH PROTOCOL: no  ASSESSMENT: [age].o. [place] woman status post radical tumor debulking with optimal cytoreduction (R0) 03/27/2015 for a stage IIIC, high-grade right fallopian tube carcinoma  (a) baseline CA-125 was1226.  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  (c.1347A>G (p.Lys449Lys)  (1) adjuvant chemotherapy consisted of carboplatin and paclitaxel for 6 doses, begun 04/14/2015, completed 08/11/2015  (a) paclitaxel was omitted from cycle 5 and dose reduced on cycle 6 because of neuropathy   (b) a "make up" dose of paclitaxel was given 08/25/2015  (c) last carboplatin dose was 08/11/2015  (d) CA-125 normalized by 06/16/2015   (2) FIRST RECURRENCE: January 2018  (a) CA-125 rise beginning November 2017 led to CT scans and PET scans December 2017, all negative  (b) exploratory laparotomy 02/24/2016 showed pathologically confirmed recurrence, with miliary disease involving all examined surfaces  (c) port-site metastases noted 03/08/2016  (3) carboplatin/liposomal doxorubicin started 03/05/2016, repeated every three weeks.  (a) cycle 3 delayed one week because of neutropenia; OnPro added  (b) niraparib or olaparib or RUCAPARIB to follow as maintenance  PLAN [facility] ready to proceed to cycle 3 of her chemotherapy. The only change or making is adding OnPro.  When she received Neulasta last year she had significant bony pain. She is reluctant to take any narcotics. She says that she uses ibuprofen. We reviewed the fact that she can use 800 mg of ibuprofen 3 times a day with food. I am adding tramadol to her medications and she can take both the ibuprofen and tramadol, 50-100 mg 4 times a day as needed. Hopefully with these agents she will be able to tolerate the bony pain which is usually worsened very first dose  Otherwise we will recheck her lab work in 2 weeks. She likes to continue to check the CA-125 every 2 weeks  for morale reasons. She will see me again in 4 weeks with her fourth cycle of chemotherapy. She knows to call for any problems that may develop before her next visit.  :Chauncey Cruel, MD   05/07/2016 8:20 AM Medical Oncology and Hematology [facility] [place], [place] [address] Tel. [phone]    Fax. [phone]

## 2016-05-07 NOTE — Patient Instructions (Signed)
[facility] Discharge Instructions for Patients Receiving Chemotherapy  Today you received the following chemotherapy agents:  Doxil and Carboplatin.  To help prevent nausea and vomiting after your treatment, we encourage you to take your nausea medication as directed.   If you develop nausea and vomiting that is not controlled by your nausea medication, call the clinic.   BELOW ARE SYMPTOMS THAT SHOULD BE REPORTED IMMEDIATELY:  *FEVER GREATER THAN 100.5 F  *CHILLS WITH OR WITHOUT FEVER  NAUSEA AND VOMITING THAT IS NOT CONTROLLED WITH YOUR NAUSEA MEDICATION  *UNUSUAL SHORTNESS OF BREATH  *UNUSUAL BRUISING OR BLEEDING  TENDERNESS IN MOUTH AND THROAT WITH OR WITHOUT PRESENCE OF ULCERS  *URINARY PROBLEMS  *BOWEL PROBLEMS  UNUSUAL RASH Items with * indicate a potential emergency and should be followed up as soon as possible.  Feel free to call the clinic you have any questions or concerns. The clinic phone number is [phone].  Please show the [facility] at check-in to the Emergency Department and triage nurse.

## 2016-05-07 NOTE — Progress Notes (Signed)
Per Dr. Jana Hakim, ok to tx with ANC 1.4 and using CMP results from 04/30/2016.

## 2016-05-10 ENCOUNTER — Other Ambulatory Visit: Payer: Self-pay | Admitting: Oncology

## 2016-05-21 ENCOUNTER — Other Ambulatory Visit (HOSPITAL_BASED_OUTPATIENT_CLINIC_OR_DEPARTMENT_OTHER): Payer: 59

## 2016-05-21 DIAGNOSIS — C5701 Malignant neoplasm of right fallopian tube: Secondary | ICD-10-CM | POA: Diagnosis not present

## 2016-05-21 DIAGNOSIS — C786 Secondary malignant neoplasm of retroperitoneum and peritoneum: Secondary | ICD-10-CM

## 2016-05-21 DIAGNOSIS — C762 Malignant neoplasm of abdomen: Secondary | ICD-10-CM

## 2016-05-21 DIAGNOSIS — C801 Malignant (primary) neoplasm, unspecified: Secondary | ICD-10-CM

## 2016-05-21 LAB — COMPREHENSIVE METABOLIC PANEL
ALT: 34 U/L (ref 0–55)
AST: 23 U/L (ref 5–34)
Albumin: 4.2 g/dL (ref 3.5–5.0)
Alkaline Phosphatase: 93 U/L (ref 40–150)
Anion Gap: 10 mEq/L (ref 3–11)
BUN: 11 mg/dL (ref 7.0–26.0)
CO2: 26 mEq/L (ref 22–29)
Calcium: 9.4 mg/dL (ref 8.4–10.4)
Chloride: 104 mEq/L (ref 98–109)
Creatinine: 0.7 mg/dL (ref 0.6–1.1)
EGFR: >90 mL/min/{1.73_m2} (ref >90)
Glucose: 100 mg/dl (ref 70–140)
Potassium: 4.2 mEq/L (ref 3.5–5.1)
Sodium: 140 mEq/L (ref 136–145)
Total Bilirubin: 0.43 mg/dL (ref 0.20–1.20)
Total Protein: 7 g/dL (ref 6.4–8.3)

## 2016-05-21 LAB — CBC WITH DIFFERENTIAL/PLATELET
BASO%: 0.3 % (ref 0.0–2.0)
Basophils Absolute: 0 10*3/uL (ref 0.0–0.1)
EOS%: 0.2 % (ref 0.0–7.0)
Eosinophils Absolute: 0 10*3/uL (ref 0.0–0.5)
HCT: 32.7 % — ABNORMAL LOW (ref 34.8–46.6)
HGB: 11.3 g/dL — ABNORMAL LOW (ref 11.6–15.9)
LYMPH%: 43.1 % (ref 14.0–49.7)
MCH: 33.9 pg (ref 25.1–34.0)
MCHC: 34.5 g/dL (ref 31.5–36.0)
MCV: 98.1 fL (ref 79.5–101.0)
MONO#: 0.3 10*3/uL (ref 0.1–0.9)
MONO%: 7 % (ref 0.0–14.0)
NEUT#: 1.8 10*3/uL (ref 1.5–6.5)
NEUT%: 49.4 % (ref 38.4–76.8)
Platelets: 185 10*3/uL (ref 145–400)
RBC: 3.34 10*6/uL — ABNORMAL LOW (ref 3.70–5.45)
RDW: 17 % — ABNORMAL HIGH (ref 11.2–14.5)
WBC: 3.7 10*3/uL — ABNORMAL LOW (ref 3.9–10.3)
lymph#: 1.6 10*3/uL (ref 0.9–3.3)

## 2016-05-22 LAB — CA 125: Cancer Antigen (CA) 125: 28.4 U/mL (ref 0.0–38.1)

## 2016-05-25 ENCOUNTER — Encounter: Payer: Self-pay | Admitting: Physical Therapy

## 2016-05-25 ENCOUNTER — Telehealth: Payer: Self-pay | Admitting: *Deleted

## 2016-05-25 ENCOUNTER — Other Ambulatory Visit: Payer: Self-pay | Admitting: *Deleted

## 2016-05-25 ENCOUNTER — Ambulatory Visit: Payer: 59 | Attending: Oncology | Admitting: Physical Therapy

## 2016-05-25 DIAGNOSIS — M62838 Other muscle spasm: Secondary | ICD-10-CM | POA: Diagnosis present

## 2016-05-25 DIAGNOSIS — M6281 Muscle weakness (generalized): Secondary | ICD-10-CM | POA: Diagnosis present

## 2016-05-25 NOTE — Therapy (Signed)
[facility] [address], [place], [place], [address] Phone: [phone]   Fax:  [phone]  Physical Therapy Treatment  Patient Details  Name: Kathryn Ramsey MRN: [id number] Date of Birth: [birth year] Referring Provider: Dr. Lurline Del  Encounter Date: 05/25/2016      PT End of Session - 05/25/16 1531    Visit Number 6   Date for PT Re-Evaluation 08/06/16   PT Start Time 1448   PT Stop Time 1530   PT Time Calculation (min) 42 min   Activity Tolerance Patient tolerated treatment well   Behavior During Therapy [facility] for tasks assessed/performed      Past Medical History:  Diagnosis Date  . Acute sensory neuropathy ([place]) 06/26/2015  . Allergy   . Anemia   . Asthma    illness induced asthma  . Blood transfusion without reported diagnosis    at age 34 years old D/T surgery to femur being crushed  . 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 Heart murmur    pt, states had a "working heart murmur"  . History of kidney stones   . PONV (postoperative nausea and vomiting)   . Skin cancer     Past Surgical History:  Procedure Laterality Date  . 3 laporscopic proceedures    . ABDOMINAL HYSTERECTOMY     2010  . CHOLECYSTECTOMY     2010  . DILATION AND CURETTAGE OF UTERUS     1997  . LAPAROSCOPY N/A 03/27/2015   Procedure: DIAGNOSTIC LAPAROSCOPY ;  Surgeon: [name], MD;  Location: [facility];  Service: Gynecology;  Laterality: N/A;  . LAPAROSCOPY N/A 02/24/2016   Procedure: LAPAROSCOPY DIAGNOSTIC WITH PERITONEAL WASHINGS AND PERITONEAL BIOPSY;  Surgeon: [name], MD;  Location[facility];  Service: Gynecology;  Laterality: N/A;  . LAPAROTOMY N/A 03/27/2015   Procedure: EXPLORATORY LAPAROTOMY, BILATERAL SALPINGO OOPHORECTOMY, OMENTECTOMY, RADICAL TUMOR DEBULKING;  Surgeon: [name], MD;  Location: [facility];  Service: Gynecology;  Laterality: N/A;  .  sinus surgery 1994      There were no vitals filed for this visit.      Subjective Assessment - 05/25/16 1452    Subjective I feel good.  I am getting my bowels better with abdominal massage and yoga. I still have pain with intercourse.    Patient Stated Goals improve pain with intercourse   Currently in Pain? Yes   Pain Score 4    Pain Location Pelvis   Pain Orientation Mid   Pain Descriptors / Indicators Aching   Pain Type Acute pain   Pain Onset 1 to 4 weeks ago   Pain Frequency Intermittent   Aggravating Factors  intercourse   Pain Relieving Factors no intercourse   Multiple Pain Sites No                      Pelvic Floor Special Questions - 05/25/16 0001    Is this Painful Yes   Marinoff Scale pain interrupts completion   Pelvic Floor Internal Exam Patient confirmed identification and approves PT to assess muscle integrity   Exam Type Vaginal           [facility] Adult PT Treatment/Exercise - 05/25/16 0001      Self-Care   Self-Care Other Self-Care Comments   Other Self-Care Comments  discussed moistrizers, lubricants, using and ordering a vibrator for the pelvic  floor area to expand the tissue for intercourse     Manual Therapy   Manual Therapy Internal Pelvic Floor;Muscle Energy Technique   Internal Pelvic Floor bil. sides of urethra and post. intoitus; puborectalis, levator ani and obturator internist   Muscle Energy Technique correct right posteriorly rotated ilium                PT Education - 05/25/16 1529    Education provided Yes   Education Details lubricants, dilator, pelvic floor massage   Person(s) Educated Patient   Methods Explanation;Demonstration;Verbal cues;Handout   Comprehension Verbalized understanding;Returned demonstration          PT Short Term Goals - 05/05/16 1155      PT SHORT TERM GOAL #1   Title independent with scar massage to improve tissue mobility   Time 4   Period Weeks   Status Achieved     PT  SHORT TERM GOAL #2   Title understand how to perform abdominal massage to promote peristalic mobility to improve constipation   Time 4   Period Weeks   Status Achieved     PT SHORT TERM GOAL #3   Title understand how to toilet correctly to reduce strain on pelvic floor and ability to relax the pelvic floor    Time 4   Period Weeks   Status Achieved     PT SHORT TERM GOAL #4   Title ability to perfrom a pelvic floor contraction with a lift due to pelvic floor tone decreased and muscle able to fully contract   Time 4   Period Weeks   Status On-going     PT SHORT TERM GOAL #5   Title understand how to perform self perineal massage to elongate pelvic floor muscles for relaxation   Time 4   Period Weeks   Status Achieved           PT Long Term Goals - 04/08/16 1335      PT LONG TERM GOAL #1   Title independent with HEP   Time 4   Period Months   Status New     PT LONG TERM GOAL #2   Title reduction of bladder pressure when urinating >/= 80% due to increased mobility of bladder and abdominal tissue   Time 4   Period Months   Status New     PT LONG TERM GOAL #3   Title pain with penile penetration decreased >/= 75% so Marinoff scale is 1/3   Time 4   Period Months   Status New     PT LONG TERM GOAL #4   Title mobility of abdominal scars improved >/= 75% so patient is able to contract lower abdominals correctly with correct toileting technique   Time 4   Period Months   Status New     PT LONG TERM GOAL #5   Title understand how to relax the pelvic floor muscles during penile penetracton and vaginal exam to 2-4uv   Time 4   Period Months   Status New     Additional Long Term Goals   Additional Long Term Goals Yes               Plan - 05/25/16 1531    Clinical Impression Statement Port sites have decreased in size and are not red anymore.  Patient has less abdominal pain.  Patient is able to have intercourse but is pain level 4/10.  Patient understands  how to perform perineal massage and  will be purchasing a dilator to assist her in massage.  Patient understands other alternates for vaginal lubricants and moisturizers.  Pelvis in correct alignment after therapy.  Patient will benefit from skilled therapy to reduce pain, increase pelvic floor mobility and increase strength.    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; presently receiving chemotherapy 1x/month;    PT Frequency 2x / week   PT Duration Other (comment)  4 months   PT Treatment/Interventions Biofeedback;Moist Heat;Therapeutic activities;Therapeutic exercise;Neuromuscular re-education;Patient/family education;Scar mobilization;Manual techniques;Dry needling;Manual lymph drainage;Taping;Cryotherapy;Electrical Stimulation   PT Next Visit Plan internal soft tissue work to pelvic floor area; joint mobilization to L4-S1;  [place] pelvis alignment   PT Home Exercise Plan progress as needed   Consulted and Agree with Plan of Care Patient      Patient will benefit from skilled therapeutic intervention in order to improve the following deficits and impairments:  Pain, Decreased mobility, Decreased strength, Decreased activity tolerance, Decreased coordination, Decreased range of motion, Increased fascial restricitons, Impaired tone, Increased muscle spasms, Decreased scar mobility  Visit Diagnosis: Other muscle spasm  Muscle weakness (generalized)     Problem List Patient Active Problem List   Diagnosis Date Noted  . Lichen sclerosus et atrophicus of the vulva 03/31/2016  . Abdominal wall mass of right lower quadrant 03/08/2016  . 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 Goals of care, counseling/discussion 02/27/2016  . Elevated CA-125 02/24/2016  . Vaginal dryness, menopausal 10/01/2015  . Drug-induced peripheral neuropathy ([place]) 09/02/2015  . Leukopenia due to antineoplastic chemotherapy ([place])  09/02/2015  . Chemotherapy-induced neuropathy ([place]) 08/19/2015  . Cellulitis 07/03/2015  . Acute sensory neuropathy ([place]) 06/26/2015  . High risk medication use 06/21/2015  . Facial paresthesia 06/18/2015  . Genetic testing 06/16/2015  . Restless legs 05/14/2015  . Family history of breast cancer in female 04/29/2015  . Chemotherapy-induced nausea 04/22/2015  . Chemotherapy induced neutropenia ([place]) 04/22/2015  . Cancer of right fallopian tube ([place]) 04/07/2015  . History of hysterectomy for benign disease 03/21/2015  . IBS (irritable bowel syndrome) 03/21/2015  . Dense breast tissue 03/21/2015  . History of cholecystectomy 03/21/2015  . Abdominal carcinomatosis ([place]) 03/21/2015    Earlie Counts, PT 05/25/16 3:35 PM   [facility] [address] [place], [place], [address] Phone: [phone]   Fax:  [phone]  Name: Kathryn Ramsey MRN: [id number] Date of Birth: 11/27/1965

## 2016-05-25 NOTE — Telephone Encounter (Signed)
Received call from pt asking for results of labs done Friday.  Reviewed labs & informed of results.  Pt was pleased & expressed appreciation.

## 2016-05-25 NOTE — Patient Instructions (Addendum)
[employer] [employer], [place], or [place]ide. Can go to the website or [facility].  Yes lubricant is on [place] lubricant.  V magic is cream to place on the vulva area  Vibio active for dilator.   [facility] [address], [place], [place] [address] Phone # [phone] Fax [phone]

## 2016-05-26 ENCOUNTER — Other Ambulatory Visit: Payer: Self-pay | Admitting: *Deleted

## 2016-05-26 DIAGNOSIS — C762 Malignant neoplasm of abdomen: Secondary | ICD-10-CM

## 2016-05-28 ENCOUNTER — Encounter: Payer: Self-pay | Admitting: Gynecologic Oncology

## 2016-05-28 ENCOUNTER — Other Ambulatory Visit: Payer: 59

## 2016-05-28 ENCOUNTER — Ambulatory Visit: Payer: 59

## 2016-05-31 ENCOUNTER — Encounter: Payer: Self-pay | Admitting: Physical Therapy

## 2016-05-31 ENCOUNTER — Ambulatory Visit: Payer: 59 | Admitting: Physical Therapy

## 2016-05-31 DIAGNOSIS — M62838 Other muscle spasm: Secondary | ICD-10-CM

## 2016-05-31 DIAGNOSIS — M6281 Muscle weakness (generalized): Secondary | ICD-10-CM

## 2016-05-31 NOTE — Patient Instructions (Addendum)
Types of Fiber  There are two main types of fiber:  insoluble and soluble.  Both of these types can prevent and relieve constipation and diarrhea, although some people find one or the other to be more easily digested.  This handout details information about both types of fiber.  Insoluble Fiber       Functions of Insoluble Fiber . moves bulk through the intestines  . controls and balances the pH (acidity) in the intestines       Benefits of Insoluble Fiber . promotes regular bowel movement and prevents constipation  . removes fecal waste through colon in less time  . keeps an optimal pH in intestines to prevent microbes from producing cancer substances, therefore preventing colon cancer        Food Sources of Insoluble Fiber . whole-wheat products  . wheat bran "miller's bran" . corn bran  . flax seed or other seeds . vegetables such as green beans, broccoli, cauliflower and potato skins  . fruit skins and root vegetable skins  . popcorn . brown rice  Soluble Fiber       Functions of Soluble Fiber  . holds water in the colon to bulk and soften the stool . prolongs stomach emptying time so that sugar is released and absorbed more slowly        Benefits of Soluble Fiber . lowers total cholesterol and LDL cholesterol (the bad cholesterol) therefore reducing the risk of heart disease  . regulates blood sugar for people with diabetes       Food Sources of Soluble Fiber . oat/oat bran . dried beans and peas  . nuts  . barley  . flax seed or other seeds . fruits such as oranges, pears, peaches, and apples  . vegetables such as carrots  . psyllium husk  . Prunes  [place] stool chart has different types of stool or look for poop troop app for phone.  [facility] [address] [place], [place] [address] Phone # [phone] Fax [phone]

## 2016-05-31 NOTE — Therapy (Signed)
[facility] [address] [place], [place], [address] Phone: [phone]   Fax:  [phone]  Physical Therapy Treatment  Patient Details  Name: Kathryn Ramsey MRN: [id number] Date of Birth: [birth year] Referring Provider: Dr. Lurline Del  Encounter Date: 05/31/2016      PT End of Session - 05/31/16 1101    Visit Number 7   Date for PT Re-Evaluation 08/06/16   PT Start Time 3295   PT Stop Time 1058   PT Time Calculation (min) 43 min   Activity Tolerance Patient tolerated treatment well   Behavior During Therapy [facility] for tasks assessed/performed      Past Medical History:  Diagnosis Date  . Acute sensory neuropathy ([place]) 06/26/2015  . Allergy   . Anemia   . Asthma    illness induced asthma  . Blood transfusion without reported diagnosis    at age 45 years old D/T surgery to femur being crushed  . 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 Heart murmur    pt, states had a "working heart murmur"  . History of kidney stones   . PONV (postoperative nausea and vomiting)   . Skin cancer     Past Surgical History:  Procedure Laterality Date  . 3 laporscopic proceedures    . ABDOMINAL HYSTERECTOMY     2010  . CHOLECYSTECTOMY     2010  . DILATION AND CURETTAGE OF UTERUS     1997  . LAPAROSCOPY N/A 03/27/2015   Procedure: DIAGNOSTIC LAPAROSCOPY ;  Surgeon: [name], MD;  Location: [facility];  Service: Gynecology;  Laterality: N/A;  . LAPAROSCOPY N/A 02/24/2016   Procedure: LAPAROSCOPY DIAGNOSTIC WITH PERITONEAL WASHINGS AND PERITONEAL BIOPSY;  Surgeon: [name], MD;  Location: [facility];  Service: Gynecology;  Laterality: N/A;  . LAPAROTOMY N/A 03/27/2015   Procedure: EXPLORATORY LAPAROTOMY, BILATERAL SALPINGO OOPHORECTOMY, OMENTECTOMY, RADICAL TUMOR DEBULKING;  Surgeon: [name], MD;  Location: [facility];  Service: Gynecology;  Laterality: N/A;  .  sinus surgery 1994      There were no vitals filed for this visit.      Subjective Assessment - 05/31/16 1018    Subjective I love the v magic cream. I have the dilator and used once and it helped. I have less pain with the dilator. My bowels are little nuggets.    Patient Stated Goals improve pain with intercourse   Currently in Pain? Yes   Pain Location Abdomen   Pain Orientation Mid   Pain Descriptors / Indicators Sore  hard   Pain Type Acute pain   Pain Onset 1 to 4 weeks ago   Pain Frequency Intermittent   Aggravating Factors  touching abdomen   Pain Relieving Factors good bowel movement   Multiple Pain Sites No            [facility] PT Assessment - 05/31/16 0001      Palpation   SI assessment  pelvis in correct alignment                     [facility] Adult PT Treatment/Exercise - 05/31/16 0001      Self-Care   Self-Care Other Self-Care Comments   Other Self-Care Comments  dicussed poop and how her is nugguts indicating contipation and how to add fiber into diet     Manual Therapy   Manual Therapy Myofascial  release;Soft tissue mobilization   Soft tissue mobilization around the abdominal wall laterally and lower abdomen   Myofascial Release release of the bladder from the vagina and sac of douglas; release of the small intestines with mesenteric ; release of the ascending intestines                 PT Education - 05/31/16 1100    Education provided Yes   Education Details introducing fiber into her diet; stool chart   Person(s) Educated Patient   Methods Explanation;Demonstration;Verbal cues;Handout   Comprehension Returned demonstration;Verbalized understanding          PT Short Term Goals - 05/31/16 1104      PT SHORT TERM GOAL #4   Title ability to perfrom a pelvic floor contraction with a lift due to pelvic floor tone decreased and muscle able to fully contract   Time 4   Period Weeks   Status Achieved           PT Long Term  Goals - 04/08/16 1335      PT LONG TERM GOAL #1   Title independent with HEP   Time 4   Period Months   Status New     PT LONG TERM GOAL #2   Title reduction of bladder pressure when urinating >/= 80% due to increased mobility of bladder and abdominal tissue   Time 4   Period Months   Status New     PT LONG TERM GOAL #3   Title pain with penile penetration decreased >/= 75% so Marinoff scale is 1/3   Time 4   Period Months   Status New     PT LONG TERM GOAL #4   Title mobility of abdominal scars improved >/= 75% so patient is able to contract lower abdominals correctly with correct toileting technique   Time 4   Period Months   Status New     PT LONG TERM GOAL #5   Title understand how to relax the pelvic floor muscles during penile penetracton and vaginal exam to 2-4uv   Time 4   Period Months   Status New     Additional Long Term Goals   Additional Long Term Goals Yes               Plan - 05/31/16 1101    Clinical Impression Statement Patient is using a vaginal dilator and v magic cream that is reducing the discomfort in the vaginal area.  Pelvis in correct alignment.  Patient had good bowel sounds during soft tissue work.  Patient had improved tissue mobility after therapy.  Patient will benefit from skilled therapy to increase pelvic floor mobility and increase strength.    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; presently receiving chemotherapy 1x/month;    PT Frequency 2x / week   PT Duration Other (comment)  4 months   PT Treatment/Interventions Biofeedback;Moist Heat;Therapeutic activities;Therapeutic exercise;Neuromuscular re-education;Patient/family education;Scar mobilization;Manual techniques;Dry needling;Manual lymph drainage;Taping;Cryotherapy;Electrical Stimulation   PT Next Visit Plan internal soft tissue work to pelvic floor area; joint mobilization to L4-S1   PT Home  Exercise Plan progress as needed   Consulted and Agree with Plan of Care Patient      Patient will benefit from skilled therapeutic intervention in order to improve the following deficits and impairments:  Pain, Decreased mobility, Decreased strength, Decreased activity tolerance, Decreased coordination, Decreased range of motion, Increased fascial restricitons, Impaired tone,  Increased muscle spasms, Decreased scar mobility  Visit Diagnosis: Other muscle spasm  Muscle weakness (generalized)     Problem List Patient Active Problem List   Diagnosis Date Noted  . Lichen sclerosus et atrophicus of the vulva 03/31/2016  . Abdominal wall mass of right lower quadrant 03/08/2016  . 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 Goals of care, counseling/discussion 02/27/2016  . Elevated CA-125 02/24/2016  . Vaginal dryness, menopausal 10/01/2015  . Drug-induced peripheral neuropathy ([place]) 09/02/2015  . Leukopenia due to antineoplastic chemotherapy ([place]) 09/02/2015  . Chemotherapy-induced neuropathy ([place]) 08/19/2015  . Cellulitis 07/03/2015  . Acute sensory neuropathy ([place]) 06/26/2015  . High risk medication use 06/21/2015  . Facial paresthesia 06/18/2015  . Genetic testing 06/16/2015  . Restless legs 05/14/2015  . Family history of breast cancer in female 04/29/2015  . Chemotherapy-induced nausea 04/22/2015  . Chemotherapy induced neutropenia ([place]) 04/22/2015  . Cancer of right fallopian tube ([place]) 04/07/2015  . History of hysterectomy for benign disease 03/21/2015  . IBS (irritable bowel syndrome) 03/21/2015  . Dense breast tissue 03/21/2015  . History of cholecystectomy 03/21/2015  . 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 [place], [place], [address] Phone: [phone]   Fax:  [phone]  Name: Kathryn Ramsey MRN: [id number] Date of Birth:  [birth year]

## 2016-06-03 ENCOUNTER — Encounter: Payer: Self-pay | Admitting: Physical Therapy

## 2016-06-03 ENCOUNTER — Ambulatory Visit: Payer: 59 | Admitting: Physical Therapy

## 2016-06-03 DIAGNOSIS — M62838 Other muscle spasm: Secondary | ICD-10-CM

## 2016-06-03 DIAGNOSIS — M6281 Muscle weakness (generalized): Secondary | ICD-10-CM

## 2016-06-03 NOTE — Therapy (Addendum)
[facility] [address] [place], [place], [address] Phone: [phone]   Fax:  [phone]  Physical Therapy Treatment  Patient Details  Name: Kathryn Ramsey MRN: [id number] Date of Birth: 1965-03-09 Referring Provider: Dr. Lurline Del  Encounter Date: 06/03/2016      PT End of Session - 06/03/16 1507    Visit Number 8   Date for PT Re-Evaluation 08/06/16   PT Start Time 1400   PT Stop Time 1445   PT Time Calculation (min) 45 min   Activity Tolerance Patient tolerated treatment well   Behavior During Therapy [facility] for tasks assessed/performed      Past Medical History:  Diagnosis Date  . Acute sensory neuropathy ([place]) 06/26/2015  . Allergy   . Anemia   . Asthma    illness induced asthma  . Blood transfusion without reported diagnosis    at age 1 years old D/T surgery to femur being crushed  . 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 Heart murmur    pt, states had a "working heart murmur"  . History of kidney stones   . PONV (postoperative nausea and vomiting)   . Skin cancer     Past Surgical History:  Procedure Laterality Date  . 3 laporscopic proceedures    . ABDOMINAL HYSTERECTOMY     2010  . CHOLECYSTECTOMY     2010  . DILATION AND CURETTAGE OF UTERUS     1997  . LAPAROSCOPY N/A 03/27/2015   Procedure: DIAGNOSTIC LAPAROSCOPY ;  Surgeon: [name], MD;  [facility];  Service: Gynecology;  Laterality: N/A;  . LAPAROSCOPY N/A 02/24/2016   Procedure: LAPAROSCOPY DIAGNOSTIC WITH PERITONEAL WASHINGS AND PERITONEAL BIOPSY;  Surgeon: [name], MD;  [facility];  Service: Gynecology;  Laterality: N/A;  . LAPAROTOMY N/A 03/27/2015   Procedure: EXPLORATORY LAPAROTOMY, BILATERAL SALPINGO OOPHORECTOMY, OMENTECTOMY, RADICAL TUMOR DEBULKING;  Surgeon: [name], MD;  [facility];  Service: Gynecology;  Laterality: N/A;  .  sinus surgery 1994      There were no vitals filed for this visit.      Subjective Assessment - 06/03/16 1405    Subjective My abdomen gets very hard.  My abdomen is my biggest complaint. I feel like my intestinal system is inflammed. I am going to the bathroom 1 time per day. I was able to have intercourse with my husband and it went well.    Patient Stated Goals improve pain with intercourse   Currently in Pain? Yes   Pain Score 2    Pain Location Abdomen   Pain Orientation Mid   Pain Descriptors / Indicators Sore   Pain Type Acute pain   Pain Onset 1 to 4 weeks ago   Pain Frequency Intermittent   Aggravating Factors  touching abdomen   Pain Relieving Factors good bowel movement   Multiple Pain Sites No                         [facility] Adult PT Treatment/Exercise - 06/03/16 0001      Manual Therapy   Manual Therapy Myofascial release   Soft tissue mobilization soft tissue work to the transverse abdominus, obliques, and around the umbilicus   Myofascial Release release of the urogenital diaphgram, release of the respiratory diaphgram  PT Education - 06/03/16 1506    Education provided Yes   Education Details explained to patient on how to stimulate the colon to improve bowel movements and increased hardness of the abdominal area. discussed with patient on the contraindications.   Person(s) Educated Patient   Methods Explanation;Demonstration;Verbal cues   Comprehension Returned demonstration;Verbalized understanding          PT Short Term Goals - 05/31/16 1104      PT SHORT TERM GOAL #4   Title ability to perfrom a pelvic floor contraction with a lift due to pelvic floor tone decreased and muscle able to fully contract   Time 4   Period Weeks   Status Achieved           PT Long Term Goals - 06/03/16 1508      PT LONG TERM GOAL #1   Title independent with HEP   Time 4   Period Months   Status On-going     PT LONG TERM  GOAL #2   Title reduction of bladder pressure when urinating >/= 80% due to increased mobility of bladder and abdominal tissue   Time 4   Period Months   Status On-going     PT LONG TERM GOAL #3   Title pain with penile penetration decreased >/= 75% so Marinoff scale is 1/3   Time 4   Period Months   Status Achieved     PT LONG TERM GOAL #5   Title understand how to relax the pelvic floor muscles during penile penetracton and vaginal exam to 2-4uv   Time 4   Period Months   Status Achieved  able to have no pain with intercourse               Plan - 06/03/16 1508    Clinical Impression Statement Patient has met LTG number 3,4,5.  Patient is able to have penetrative intercourse with no pain.  Patient reports her only issue is the abdominal pressure and discomfort.  Patient had increased softness of her abdomen after soft tissue work.  Patient will benefit from skilled therapy to increase tissue mobility.    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; presently receiving chemotherapy 1x/month;    PT Frequency 2x / week   PT Duration Other (comment)  4 months   PT Treatment/Interventions Biofeedback;Moist Heat;Therapeutic activities;Therapeutic exercise;Neuromuscular re-education;Patient/family education;Scar mobilization;Manual techniques;Dry needling;Manual lymph drainage;Taping;Cryotherapy;Electrical Stimulation   PT Next Visit Plan internal soft tissue work to pelvic floor area; joint mobilization to L4-S1   PT Home Exercise Plan progress as needed   Consulted and Agree with Plan of Care Patient      Patient will benefit from skilled therapeutic intervention in order to improve the following deficits and impairments:  Pain, Decreased mobility, Decreased strength, Decreased activity tolerance, Decreased coordination, Decreased range of motion, Increased fascial restricitons, Impaired tone, Increased  muscle spasms, Decreased scar mobility  Visit Diagnosis: Other muscle spasm  Muscle weakness (generalized)     Problem List Patient Active Problem List   Diagnosis Date Noted  . Lichen sclerosus et atrophicus of the vulva 03/31/2016  . Abdominal wall mass of right lower quadrant 03/08/2016  . 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 Goals of care, counseling/discussion 02/27/2016  . Elevated CA-125 02/24/2016  . Vaginal dryness, menopausal 10/01/2015  . Drug-induced peripheral neuropathy ([place]) 09/02/2015  . Leukopenia due to antineoplastic chemotherapy ([place]) 09/02/2015  . Chemotherapy-induced neuropathy ([place]) 08/19/2015  .  Cellulitis 07/03/2015  . Acute sensory neuropathy ([place]) 06/26/2015  . High risk medication use 06/21/2015  . Facial paresthesia 06/18/2015  . Genetic testing 06/16/2015  . Restless legs 05/14/2015  . Family history of breast cancer in female 04/29/2015  . Chemotherapy-induced nausea 04/22/2015  . Chemotherapy induced neutropenia ([place]) 04/22/2015  . Cancer of right fallopian tube ([place]) 04/07/2015  . History of hysterectomy for benign disease 03/21/2015  . IBS (irritable bowel syndrome) 03/21/2015  . Dense breast tissue 03/21/2015  . History of cholecystectomy 03/21/2015  . 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, [place], [place], [address] Phone: [phone]   Fax:  [phone]  Name: Kathryn Ramsey MRN: [id number] Date of Birth: 09/16/1965  PHYSICAL THERAPY DISCHARGE SUMMARY  Visits from Start of Care: 8  Current functional level related to goals / functional outcomes: See above   Remaining deficits: See above. Unable to assess patient due to her not returning since her last visit on 08/06/2016.   Education / Equipment: HEP Plan:                                                    Patient goals were partially  met. Patient is being discharged due to not returning since the last visit. Thank you for the referral. Earlie Counts, PT.?????

## 2016-06-04 ENCOUNTER — Ambulatory Visit (HOSPITAL_BASED_OUTPATIENT_CLINIC_OR_DEPARTMENT_OTHER): Payer: 59 | Admitting: Oncology

## 2016-06-04 ENCOUNTER — Ambulatory Visit (HOSPITAL_BASED_OUTPATIENT_CLINIC_OR_DEPARTMENT_OTHER): Payer: 59

## 2016-06-04 ENCOUNTER — Telehealth: Payer: Self-pay | Admitting: *Deleted

## 2016-06-04 ENCOUNTER — Other Ambulatory Visit (HOSPITAL_BASED_OUTPATIENT_CLINIC_OR_DEPARTMENT_OTHER): Payer: 59

## 2016-06-04 VITALS — BP 116/62 | HR 62 | Resp 20 | Ht 63.0 in | Wt 120.6 lb

## 2016-06-04 DIAGNOSIS — Z5189 Encounter for other specified aftercare: Secondary | ICD-10-CM | POA: Diagnosis not present

## 2016-06-04 DIAGNOSIS — T451X5A Adverse effect of antineoplastic and immunosuppressive drugs, initial encounter: Secondary | ICD-10-CM

## 2016-06-04 DIAGNOSIS — D701 Agranulocytosis secondary to cancer chemotherapy: Secondary | ICD-10-CM

## 2016-06-04 DIAGNOSIS — C786 Secondary malignant neoplasm of retroperitoneum and peritoneum: Secondary | ICD-10-CM | POA: Diagnosis not present

## 2016-06-04 DIAGNOSIS — C762 Malignant neoplasm of abdomen: Secondary | ICD-10-CM

## 2016-06-04 DIAGNOSIS — C801 Malignant (primary) neoplasm, unspecified: Secondary | ICD-10-CM

## 2016-06-04 DIAGNOSIS — K5900 Constipation, unspecified: Secondary | ICD-10-CM | POA: Diagnosis not present

## 2016-06-04 DIAGNOSIS — Z5111 Encounter for antineoplastic chemotherapy: Secondary | ICD-10-CM | POA: Diagnosis not present

## 2016-06-04 DIAGNOSIS — C5701 Malignant neoplasm of right fallopian tube: Secondary | ICD-10-CM | POA: Diagnosis not present

## 2016-06-04 LAB — COMPREHENSIVE METABOLIC PANEL
ALT: 22 U/L (ref 0–55)
AST: 24 U/L (ref 5–34)
Albumin: 4.6 g/dL (ref 3.5–5.0)
Alkaline Phosphatase: 73 U/L (ref 40–150)
Anion Gap: 14 mEq/L — ABNORMAL HIGH (ref 3–11)
BUN: 7.2 mg/dL (ref 7.0–26.0)
CO2: 24 mEq/L (ref 22–29)
Calcium: 9.9 mg/dL (ref 8.4–10.4)
Chloride: 103 mEq/L (ref 98–109)
Creatinine: 0.7 mg/dL (ref 0.6–1.1)
EGFR: >90 mL/min/{1.73_m2} (ref >90)
Glucose: 86 mg/dl (ref 70–140)
Potassium: 4 mEq/L (ref 3.5–5.1)
Sodium: 141 mEq/L (ref 136–145)
Total Bilirubin: 0.72 mg/dL (ref 0.20–1.20)
Total Protein: 7.8 g/dL (ref 6.4–8.3)

## 2016-06-04 LAB — CBC WITH DIFFERENTIAL/PLATELET
BASO%: 0.3 % (ref 0.0–2.0)
Basophils Absolute: 0 10*3/uL (ref 0.0–0.1)
EOS%: 0.3 % (ref 0.0–7.0)
Eosinophils Absolute: 0 10*3/uL (ref 0.0–0.5)
HCT: 37.4 % (ref 34.8–46.6)
HGB: 12.8 g/dL (ref 11.6–15.9)
LYMPH%: 37.2 % (ref 14.0–49.7)
MCH: 34.1 pg — ABNORMAL HIGH (ref 25.1–34.0)
MCHC: 34.2 g/dL (ref 31.5–36.0)
MCV: 99.7 fL (ref 79.5–101.0)
MONO#: 0.5 10*3/uL (ref 0.1–0.9)
MONO%: 12.7 % (ref 0.0–14.0)
NEUT#: 1.9 10*3/uL (ref 1.5–6.5)
NEUT%: 49.5 % (ref 38.4–76.8)
Platelets: 272 10*3/uL (ref 145–400)
RBC: 3.75 10*6/uL (ref 3.70–5.45)
RDW: 16.7 % — ABNORMAL HIGH (ref 11.2–14.5)
WBC: 3.9 10*3/uL (ref 3.9–10.3)
lymph#: 1.4 10*3/uL (ref 0.9–3.3)
nRBC: 0 % (ref 0–0)

## 2016-06-04 MED ORDER — PALONOSETRON HCL INJECTION 0.25 MG/5ML
0.2500 mg | Freq: Once | INTRAVENOUS | Status: AC
  Administered 2016-06-04: 0.25 mg via INTRAVENOUS

## 2016-06-04 MED ORDER — RUCAPARIB CAMSYLATE 300 MG PO TABS: 600.0000 mg | ORAL_TABLET | Freq: Two times a day (BID) | ORAL | 4 refills | Status: DC

## 2016-06-04 MED ORDER — LORAZEPAM 2 MG/ML IJ SOLN
INTRAMUSCULAR | Status: AC
  Filled 2016-06-04: qty 1

## 2016-06-04 MED ORDER — SODIUM CHLORIDE 0.9 % IV SOLN
478.0000 mg | Freq: Once | INTRAVENOUS | Status: AC
  Administered 2016-06-04: 480 mg via INTRAVENOUS
  Filled 2016-06-04: qty 48

## 2016-06-04 MED ORDER — DEXTROSE 5 % IV SOLN
Freq: Once | INTRAVENOUS | Status: AC
  Administered 2016-06-04: 11:00:00 via INTRAVENOUS

## 2016-06-04 MED ORDER — PEGFILGRASTIM 6 MG/0.6ML ~~LOC~~ PSKT
6.0000 mg | PREFILLED_SYRINGE | Freq: Once | SUBCUTANEOUS | Status: AC
  Administered 2016-06-04: 6 mg via SUBCUTANEOUS
  Filled 2016-06-04: qty 0.6

## 2016-06-04 MED ORDER — PALONOSETRON HCL INJECTION 0.25 MG/5ML
INTRAVENOUS | Status: AC
  Filled 2016-06-04: qty 5

## 2016-06-04 MED ORDER — DEXAMETHASONE SODIUM PHOSPHATE 10 MG/ML IJ SOLN
10.0000 mg | Freq: Once | INTRAMUSCULAR | Status: AC
  Administered 2016-06-04: 10 mg via INTRAVENOUS

## 2016-06-04 MED ORDER — DEXAMETHASONE SODIUM PHOSPHATE 10 MG/ML IJ SOLN
INTRAMUSCULAR | Status: AC
  Filled 2016-06-04: qty 1

## 2016-06-04 MED ORDER — LORAZEPAM 2 MG/ML IJ SOLN
0.5000 mg | Freq: Once | INTRAMUSCULAR | Status: AC
  Administered 2016-06-04: 0.5 mg via INTRAVENOUS

## 2016-06-04 MED ORDER — DEXTROSE 5 % IV SOLN
30.0000 mg/m2 | Freq: Once | INTRAVENOUS | Status: AC
  Administered 2016-06-04: 46 mg via INTRAVENOUS
  Filled 2016-06-04: qty 23

## 2016-06-04 MED ORDER — CARBOPLATIN CHEMO INTRADERMAL TEST DOSE 100MCG/0.02ML
100.0000 ug | Freq: Once | INTRADERMAL | Status: AC
  Administered 2016-06-04: 100 ug via INTRADERMAL
  Filled 2016-06-04: qty 0.02

## 2016-06-04 NOTE — Progress Notes (Signed)
[place]  Telephone:([phone]) [phone] [phone]     ID: Kathryn Ramsey DOB: [birth year]  MR#: [id number]  NLZ#:[id number]  Patient Care Team: Harlan Stains, MD as PCP - General (Family Medicine) Gordy Levan, MD as Consulting Physician (Oncology) [name], MD as Consulting Physician (Obstetrics and Gynecology) Juanita Craver, MD as Consulting Physician (Gastroenterology) Servando Salina, MD as Consulting Physician (Obstetrics and Gynecology) Nickie Retort, MD as Consulting Physician (Urology) Chauncey Cruel, MD OTHER MD:  CHIEF COMPLAINT: Recurrent ovarian cancer  CURRENT TREATMENT: Carboplatin, liposomal doxorubicin   HISTORY OF PRESENT ILLNESS: From Dr. Serita Grit original intake note 03/17/2015:  "Kathryn Ramsey is a very pleasant G4P4 who is seen in consultation at the request of Dr Collene Mares and Dr Garwin Brothers for peritoneal carcinomatosis. The patient has a history of a workup by urology for gross hematuria which included a pelvic examine was suggested for extrinsic mass effect on the bladder on cystoscopy. Cystoscopy took place on in December 2016. The patient denies abdominal pain bloating early satiety or abdominal distention.  On 08/03/2015 at [facility] she underwent a CT scan of the abdomen and pelvis as ordered by Dr Baruch Gouty. This revealed a 1.4 cm low attenuation lesion on the posterior right hepatic lobe suggestive of a hemangioma. Status post hysterectomy. No ovarian masses. Moderate ascites. Omental caking seen in the lateral left abdomen and pelvis. Peritoneal nodularity in the left lateral pelvic cul-de-sac. No gross extrinsic compression on the bladder was identified on imaging.  The patient was then seen by her gastroenterologist, Dr. Collene Mares, who performed a colonoscopy which was unremarkable.  Tumor markers were drawn on 08/04/2015 and these included a CA-125 that was elevated to 957, and a CEA that was normal at 1."  On 03/27/2015 the  patient underwent diagnostic laparoscopy, exploratory laparotomy with bilateral salpingo-oophorectomy, omentectomy, and radical tumor debulking with optimal cytoreduction (R0) of what proved to be a stage IIIc right fallopian tube cancer. She was treated adjuvantly with carboplatin and paclitaxel, as detailed below. Her CA 125 dropped from 1226 on 03/20/2015 to 26.1 by 06/16/2015.  Her subsequent history is as detailed below.  INTERVAL HISTORY: Kathryn Ramsey returns today for follow-up and treatment of her recurrent fallopian tube adenocarcinoma, with carcinomatosis and port site metastases. She will receive her fourth cycle of carboplatin and liposomal doxorubicin today. Generally she tolerates her treatments well. She exercises regularly, has an excellent diet, and uses multiple supplements to help with side effects.  She tells me she sought a second opinion at [facility] and the physician there suggested additional genetics testing.  REVIEW OF SYSTEMS: Kathryn Ramsey is managing her constipation problems well trying to avoid medications, but taking stool softeners more regularly. She has very minimal numbness or tingling in her feet and hands. This is unchanged. She denies problems with mouth sore, significant taste alteration, nausea, or vomiting. She had some problems in her vulvar area which have been taken care of. She has a little tightness feeling in the right lower quadrant of the abdomen, which is relieved with defecation. Aside from this a detailed review of systems today was noncontributory  PAST MEDICAL HISTORY: Past Medical History:  Diagnosis Date  . Acute sensory neuropathy ([place]) 06/26/2015  . Allergy   . Anemia   . Asthma    illness induced asthma  . Blood transfusion without reported diagnosis    at age 36 years old D/T surgery to femur being crushed  . Complication of anesthesia    hx. of allergic to ether (  had surgery at 5 years and had reaction to the ether  . Heart murmur    pt, states  had a "working heart murmur"  . History of kidney stones   . PONV (postoperative nausea and vomiting)   . Skin cancer     PAST SURGICAL HISTORY: Past Surgical History:  Procedure Laterality Date  . 3 laporscopic proceedures    . ABDOMINAL HYSTERECTOMY     2010  . CHOLECYSTECTOMY     2010  . DILATION AND CURETTAGE OF UTERUS     1997  . LAPAROSCOPY N/A 03/27/2015   Procedure: DIAGNOSTIC LAPAROSCOPY ;  Surgeon: [name], MD;  Location: [facility];  Service: Gynecology;  Laterality: N/A;  . LAPAROSCOPY N/A 02/24/2016   Procedure: LAPAROSCOPY DIAGNOSTIC WITH PERITONEAL WASHINGS AND PERITONEAL BIOPSY;  Surgeon: [name], MD;  Location: [facility];  Service: Gynecology;  Laterality: N/A;  . LAPAROTOMY N/A 03/27/2015   Procedure: EXPLORATORY LAPAROTOMY, BILATERAL SALPINGO OOPHORECTOMY, OMENTECTOMY, RADICAL TUMOR DEBULKING;  Surgeon: [name], MD;  Location: [facility];  Service: Gynecology;  Laterality: N/A;  . sinus surgery 1994      FAMILY HISTORY Family History  Problem Relation Age of Onset  . Hypertension Father   . Skin cancer Father     nonmelanoma skin cancers in his late 38s  . Non-Hodgkin's lymphoma Maternal Aunt     dx. 52s; smoker  . Stroke Maternal Grandmother   . Other Mother     benign meningioma dx. early-mid-70s; hysterectomy in her late 69s for heavy periods - still has ovaries  . Other Son     one son with pre-cancerous skin findings  . Other Daughter     cysts on ovaries and hx of heavy periods  . Other Sister     hysterectomy for cysts - still has ovaries  . Heart attack Maternal Grandfather   . Heart attack Paternal Grandfather   . Renal cancer Maternal Aunt 60    smoker  . Breast cancer Maternal Aunt 78  . Other Maternal Aunt     dx. benign brain tumor (meningioma) at 5; 2nd benign brain tumor in her late 74s; hx of radical hysterectomy at age 35  . Brain cancer Other     NOS tumor  The patient's parents are both living, in their late 48s as of January 2018. The  patient has one brother, 3 sisters. There is no history of ovarian cancer in the family. One maternal aunt had breast and kidney cancer, another had non-Hodgkin's lymphoma.  GYNECOLOGIC HISTORY:  No LMP recorded. Patient has had a hysterectomy. 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 Her husband Kathryn Ramsey is a Engineer, maintenance (IT). Their children are 57, 60, 75 and 28 y/o as of JAN 2018. The oldest works as an Electrical engineer in the [employer] in [place]. The other 3 children live in [place], the youngest currently attending [employer]. Rhe patient has no grandchildren. She attends [employer]    ADVANCED DIRECTIVES:    HEALTH MAINTENANCE: Social History  Substance Use Topics  . Smoking status: Never Smoker  . Smokeless tobacco: Never Used  . Alcohol use Yes     Comment:  3 glasses wine or beer/week     Colonoscopy:2016  PAP: s/p hyst  Bone density:   Allergies  Allergen Reactions  . Bee Venom Shortness Of Breath    swelling  . Sulfa  Antibiotics Shortness Of Breath    Vomit , diarrhea , hives    Current Outpatient Prescriptions  Medication Sig Dispense Refill  . B COMPLEX VITAMINS SL Place 1 tablet under the tongue daily.    . clidinium-chlordiazePOXIDE (LIBRAX) 5-2.5 MG capsule Take 1 capsule by mouth at bedtime as needed (IBS symptoms). Reported on 08/11/2015  0  . dexamethasone (DECADRON) 4 MG tablet Take 2 tablets (8 mg total) by mouth daily. Start the day after chemotherapy for 2 days. 30 tablet 1  . gabapentin (NEURONTIN) 100 MG capsule Take 1 capsule (100 mg total) by mouth at bedtime. Takes 19m at bedtime. Takes an additional 1067mduring the day if needed for nerve pain. 90 capsule 4  . ibuprofen (ADVIL,MOTRIN) 200 MG tablet Take 400 mg by mouth daily as needed for mild pain. Reported on 08/18/2015    . Lactobacillus (PROBIOTIC ACIDOPHILUS PO) Take 1 capsule by mouth every  evening.    . Marland KitchenORazepam (ATIVAN) 0.5 MG tablet Take 0.5 mg by mouth at bedtime.    . Multiple Vitamin (MULTIVITAMIN WITH MINERALS) TABS tablet Take 1 tablet by mouth daily.    . ondansetron (ZOFRAN) 8 MG tablet Take 1 tablet (8 mg total) by mouth 2 (two) times daily as needed for refractory nausea / vomiting. Start on day 3 after chemo. 30 tablet 1  . senna (SENOKOT) 8.6 MG TABS tablet Take 1 tablet (8.6 mg total) by mouth at bedtime. 120 each 0  . traMADol (ULTRAM) 50 MG tablet Take 1-2 tablets (50-100 mg total) by mouth every 6 (six) hours as needed. 60 tablet 2  . TURMERIC PO Take 1 tablet by mouth daily.    . valACYclovir (VALTREX) 1000 MG tablet Take 1 tablet (1,000 mg total) by mouth 2 (two) times daily. 30 tablet 3   No current facility-administered medications for this visit.     OBJECTIVE: middle aged White woman Who appears stated age Vi52  06/04/16 0812  BP: 116/62  Pulse: 62  Resp: 20     Body mass index is 21.36 kg/m.    ECOG FS:1 - Symptomatic but completely ambulatory  Sclerae unicteric, pupils round and equal Oropharynx clear and moist No cervical or supraclavicular adenopathy Lungs no rales or rhonchi Heart regular rate and rhythm Abd soft, nontender, positive bowel sounds MSK no focal spinal tenderness, no upper extremity lymphedema Neuro: nonfocal, well oriented, appropriate affect Breasts: Deferred Skin: The port site metastases are now all nonpalpable except for the one in the right lower quadrant, where there is an area of approximately two thirds of a centimeter of in duration. There is no erythema associated with this. There is no tenderness to palpation.  Photo of right lower quadrant port-site metastases 03/15/2016    Photo 04/02/2016    LAB RESULTS:  CMP     Component Value Date/Time   NA 140 05/21/2016 0816   K 4.2 05/21/2016 0816   CL 104 06/18/2015 1248   CO2 26 05/21/2016 0816   GLUCOSE 100 05/21/2016 0816   BUN 11.0 05/21/2016 0816     CREATININE 0.7 05/21/2016 0816   CALCIUM 9.4 05/21/2016 0816   PROT 7.0 05/21/2016 0816   ALBUMIN 4.2 05/21/2016 0816   AST 23 05/21/2016 0816   ALT 34 05/21/2016 0816   ALKPHOS 93 05/21/2016 0816   BILITOT 0.43 05/21/2016 0816   GFRNONAA >60 06/18/2015 1248   GFRAA >60 06/18/2015 1248    INo results found for: SPEP, UPEP  Lab Results  Component Value Date   WBC 3.7 (L) 05/21/2016   NEUTROABS 1.8 05/21/2016   HGB 11.3 (L) 05/21/2016   HCT 32.7 (L) 05/21/2016   MCV 98.1 05/21/2016   PLT 185 05/21/2016      Chemistry      Component Value Date/Time   NA 140 05/21/2016 0816   K 4.2 05/21/2016 0816   CL 104 06/18/2015 1248   CO2 26 05/21/2016 0816   BUN 11.0 05/21/2016 0816   CREATININE 0.7 05/21/2016 0816      Component Value Date/Time   CALCIUM 9.4 05/21/2016 0816   ALKPHOS 93 05/21/2016 0816   AST 23 05/21/2016 0816   ALT 34 05/21/2016 0816   BILITOT 0.43 05/21/2016 0816       No results found for: LABCA2  No components found for: OVFIE332  No results for input(s): INR in the last 168 hours.  Urinalysis    Component Value Date/Time   COLORURINE YELLOW 03/29/2015 0920   APPEARANCEUR CLEAR 03/29/2015 0920   LABSPEC 1.005 10/09/2015 1239   PHURINE 6.0 10/09/2015 1239   PHURINE 7.5 03/29/2015 0920   GLUCOSEU Negative 10/09/2015 1239   HGBUR Negative 10/09/2015 1239   HGBUR SMALL (A) 03/29/2015 0920   BILIRUBINUR Negative 10/09/2015 1239   KETONESUR Negative 10/09/2015 1239   KETONESUR NEGATIVE 03/29/2015 0920   PROTEINUR Negative 10/09/2015 1239   PROTEINUR NEGATIVE 03/29/2015 0920   UROBILINOGEN 0.2 10/09/2015 1239   NITRITE Negative 10/09/2015 1239   NITRITE NEGATIVE 03/29/2015 0920   LEUKOCYTESUR Negative 10/09/2015 1239   The CA-27-29 has normalized  STUDIES: Mammography at [facility] February 2018 and showed the breast density to be category C. There was no evidence of malignancy.   ELIGIBLE FOR AVAILABLE RESEARCH PROTOCOL:  no  ASSESSMENT: 51 y.o. [place] woman status post radical tumor debulking with optimal cytoreduction (R0) 03/27/2015 for a stage IIIC, high-grade right fallopian tube carcinoma  (a) baseline CA-125 was1226.  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)  (1) adjuvant chemotherapy consisted of carboplatin and paclitaxel for 6 doses, begun 04/14/2015, completed 08/11/2015  (a) paclitaxel was omitted from cycle 5 and dose reduced on cycle 6 because of neuropathy   (b) a "make up" dose of paclitaxel was given 08/25/2015  (c) last carboplatin dose was 08/11/2015  (d) CA-125 normalized by 06/16/2015   (2) FIRST RECURRENCE: January 2018  (a) CA-125 rise beginning November 2017 led to CT scans and PET scans December 2017, all negative  (b) exploratory laparotomy 02/24/2016 showed pathologically confirmed recurrence, with miliary disease involving all examined surfaces  (c) port-site metastases noted 03/08/2016  (3) carboplatin/liposomal doxorubicin started 03/05/2016, repeated every three weeks.  (a) cycle 3 delayed one week because of neutropenia; OnPro added  (b) niraparib or olaparib or RUCAPARIB to follow as maintenance  PLAN Kathryn Ramsey did well with her first 3 cycles of Doxil and carboplatin and her CA 125 normalized after the third cycle.. She is receiving a fourth cycle today. We will repeat her labs in 2 weeks as we usually do, but at this point we can consider stopping the chemotherapy and moving to a PARP inhibitor for maintenance.  I am going to obtain a CT of the abdomen and pelvis in 10 days or so to give [name] a new baseline. I'm also asking Dr. Denman George to reevaluate the patient. The only one of the port  site metastases of concern to me at this point is to one of the right lower quadrant. She may want to have that biopsied or  simply remove it. I will also be interested in her opinion regarding moving to maintenance at this point versus continuing with more chemotherapy  Kathryn Ramsey sought a second opinion at [facility]. Dr. Theora Gianotti suggested additional genetics testing for HRD and I will try to set that up for [facility].  Otherwise she will see me again in 4 weeks. I anticipate at that time we will start her on rucaparib and in preparation for that I have gone ahead and placed that order so our oral chemotherapy pharmacist can begin working on obtaining that medication for her.     :Chauncey Cruel, MD   06/04/2016 8:16 AM Medical Oncology and Hematology [facility] [place], [place] [address] Tel. [phone]    Fax. [phone]  From Dr Kathryn Crane 03/23/2016 note:  Per the patient and documentation noted in Care Everywhere, the patient underwent cycle 1 of liposomal Doxorubicin and Carboplatin on 03/05/16 with plan for cycle 2 on 04/02/16 and Olaparib or Niraparib to follow as maintenance once her CA-125 levels normalize.   We recommend continuation of the plan per Dr. Denman George at [facility] as the treatment she has received is the standard of care for treatment. We discussed the benefits and risks of PARP inhibitors as maintenance chemotherapy. We reviewed alternative options such as bevacizumab concurrent with chemotherapy followed by maintenance versus chemotherapy alone. We also reviewed the choice of chemotherapy agents and management of platinum-resistant disease. "Resistant" vs "sensitive" disease while often based on TFI is based on a continuum. There are several chemotherapy options and treatment management is based on the the risk:benefit ratio. She is satisfied with her current regimen and is tolerating therapy well.   The patient was counseled regarding the risks of PARP Inhibitors including: anemia (20%), neutropenia, thrombocytopenia (particulary with Niraparib), fatigue, nausea, possible  elevation of LFTs, possible elevation of creatinine, and/or rash. HRD testing may be useful to understand the magnitude of benefit from PARPi.   Suggested return to clinic as needed.   The patient's diagnosis, an outline of the further diagnostic and laboratory studies which will be required, the recommendation, and alternatives were discussed. All questions were answered to the patient's satisfaction.  A total of 60 minutes were spent with the patient/family today; 80% was spent in education, counseling and coordination of care for recurrent high grade serous Fallopian tube cancer .

## 2016-06-04 NOTE — Patient Instructions (Signed)
[place] Discharge Instructions for Patients Receiving Chemotherapy  Today you received the following chemotherapy agents: Doxil and Carboplatin   To help prevent nausea and vomiting after your treatment, we encourage you to take your nausea medication as directed.    If you develop nausea and vomiting that is not controlled by your nausea medication, call the clinic.   BELOW ARE SYMPTOMS THAT SHOULD BE REPORTED IMMEDIATELY:  *FEVER GREATER THAN 100.5 F  *CHILLS WITH OR WITHOUT FEVER  NAUSEA AND VOMITING THAT IS NOT CONTROLLED WITH YOUR NAUSEA MEDICATION  *UNUSUAL SHORTNESS OF BREATH  *UNUSUAL BRUISING OR BLEEDING  TENDERNESS IN MOUTH AND THROAT WITH OR WITHOUT PRESENCE OF ULCERS  *URINARY PROBLEMS  *BOWEL PROBLEMS  UNUSUAL RASH Items with * indicate a potential emergency and should be followed up as soon as possible.  Feel free to call the clinic you have any questions or concerns. The clinic phone number is [phone].  Please show the [facility] at check-in to the Emergency Department and triage nurse.

## 2016-06-04 NOTE — Telephone Encounter (Signed)
I have called and left the patient a message to call the office back to schedule an appt for Dr. Denman George per Dr. Jana Hakim.

## 2016-06-07 ENCOUNTER — Telehealth: Payer: Self-pay | Admitting: *Deleted

## 2016-06-07 LAB — CA 125: Cancer Antigen (CA) 125: 24.5 U/mL (ref 0.0–38.1)

## 2016-06-07 NOTE — Telephone Encounter (Signed)
I have scheduled the patient to see Dr. Denman George per Dr. Jana Hakim on May 9th at 9:30am. Patient is aware

## 2016-06-10 ENCOUNTER — Telehealth: Payer: Self-pay

## 2016-06-10 ENCOUNTER — Telehealth: Payer: Self-pay | Admitting: *Deleted

## 2016-06-10 ENCOUNTER — Telehealth: Payer: Self-pay | Admitting: Pharmacist

## 2016-06-10 DIAGNOSIS — C5701 Malignant neoplasm of right fallopian tube: Secondary | ICD-10-CM

## 2016-06-10 NOTE — Telephone Encounter (Signed)
Oral Chemotherapy Pharmacist Encounter  Received notification from [facility] that patient's Rubraca prescription would require prior authorization. Labs from 06/04/16 reviewed, OK for treatment Current medication list in Epic reviewed, no DDIs with Rubraca identified  Prior authorization submitted on CoverMyMeds [facility] Status is pending.  Oral Oncology Clinic will continue to follow  Johny Drilling, PharmD, BCPS, BCOP 06/10/2016  9:56 AM Oral Oncology Clinic [phone]

## 2016-06-10 NOTE — Telephone Encounter (Deleted)
Called pt to see if she was going to make to her 12pm appt with Dr.Gudena. Pt states that she had been having diarrhea since this morning.Her family members had it and this morning, she started running to the bathroom a lot. Pt states that she will need to reschedule her appt with Dr.Gudena again. Encourgaed pt to hydrate today and will send a message to Seth Bake (new pt scheduler) to make sure we get pt rescheduled soon. Told pt that she will be hearing back from our scheduling dept to confirm time/date of new appt.Pt verbalized understanding.

## 2016-06-10 NOTE — Telephone Encounter (Signed)
"  I received Doxil, Carboplatin Friday.  I started having severe stomach cramping and pain in my abdomen.  I'm shy om pain medicines because I do not want constipation.  I use Colace and Senokot.  Bowels are moving but yesterday it was balls.  I am bloated, tender all over even on my sides.  Abdomen distended since Sunday.  Nausea but no vomiting.  My appetite is good.  I eat despite nausea and drink 64 oz water daily.  Mira lax doesn't work for me.  Return number [phone]."        Will notify provider.   Nurse instructions are to increase activity by walking around the house, force fluids especially hot beverages and apple juice.  Reviewed [facility] constipation guidelines.

## 2016-06-10 NOTE — Telephone Encounter (Signed)
Error

## 2016-06-10 NOTE — Telephone Encounter (Signed)
This RN called to have pt's CT scan scheduled.  Pt called with appt time for 5/3 at 0900

## 2016-06-15 ENCOUNTER — Encounter: Payer: 59 | Admitting: Physical Therapy

## 2016-06-16 ENCOUNTER — Other Ambulatory Visit: Payer: 59

## 2016-06-17 ENCOUNTER — Ambulatory Visit (HOSPITAL_COMMUNITY)
Admission: RE | Admit: 2016-06-17 | Discharge: 2016-06-17 | Disposition: A | Discharge status: Home / Self Care | Payer: 59 | Source: Ambulatory Visit | Attending: Oncology | Admitting: Oncology

## 2016-06-17 ENCOUNTER — Telehealth: Payer: Self-pay

## 2016-06-17 DIAGNOSIS — C5701 Malignant neoplasm of right fallopian tube: Secondary | ICD-10-CM | POA: Insufficient documentation

## 2016-06-17 DIAGNOSIS — T451X5A Adverse effect of antineoplastic and immunosuppressive drugs, initial encounter: Secondary | ICD-10-CM | POA: Diagnosis not present

## 2016-06-17 DIAGNOSIS — C786 Secondary malignant neoplasm of retroperitoneum and peritoneum: Secondary | ICD-10-CM | POA: Diagnosis present

## 2016-06-17 DIAGNOSIS — D701 Agranulocytosis secondary to cancer chemotherapy: Secondary | ICD-10-CM | POA: Diagnosis not present

## 2016-06-17 DIAGNOSIS — C762 Malignant neoplasm of abdomen: Secondary | ICD-10-CM | POA: Insufficient documentation

## 2016-06-17 DIAGNOSIS — C801 Malignant (primary) neoplasm, unspecified: Secondary | ICD-10-CM

## 2016-06-17 MED ORDER — IOPAMIDOL (ISOVUE-300) INJECTION 61%
INTRAVENOUS | Status: AC
  Administered 2016-06-17: 100 mL
  Filled 2016-06-17: qty 100

## 2016-06-17 MED ORDER — IOPAMIDOL (ISOVUE-370) INJECTION 76%: 100.0000 mL | Freq: Once | INTRAVENOUS | Status: DC | PRN

## 2016-06-17 NOTE — Telephone Encounter (Signed)
Pt called for CT results from today's CT. Read her the impression.

## 2016-06-18 ENCOUNTER — Other Ambulatory Visit (HOSPITAL_BASED_OUTPATIENT_CLINIC_OR_DEPARTMENT_OTHER): Payer: 59

## 2016-06-18 DIAGNOSIS — C762 Malignant neoplasm of abdomen: Secondary | ICD-10-CM

## 2016-06-18 DIAGNOSIS — C786 Secondary malignant neoplasm of retroperitoneum and peritoneum: Secondary | ICD-10-CM

## 2016-06-18 DIAGNOSIS — C801 Malignant (primary) neoplasm, unspecified: Secondary | ICD-10-CM

## 2016-06-18 DIAGNOSIS — C5701 Malignant neoplasm of right fallopian tube: Secondary | ICD-10-CM | POA: Diagnosis not present

## 2016-06-18 LAB — COMPREHENSIVE METABOLIC PANEL
ALT: 38 U/L (ref 0–55)
AST: 26 U/L (ref 5–34)
Albumin: 4.6 g/dL (ref 3.5–5.0)
Alkaline Phosphatase: 95 U/L (ref 40–150)
Anion Gap: 12 mEq/L — ABNORMAL HIGH (ref 3–11)
BUN: 8.3 mg/dL (ref 7.0–26.0)
CO2: 27 mEq/L (ref 22–29)
Calcium: 10 mg/dL (ref 8.4–10.4)
Chloride: 104 mEq/L (ref 98–109)
Creatinine: 0.7 mg/dL (ref 0.6–1.1)
EGFR: >90 mL/min/{1.73_m2} (ref >90)
Glucose: 104 mg/dl (ref 70–140)
Potassium: 3.8 mEq/L (ref 3.5–5.1)
Sodium: 143 mEq/L (ref 136–145)
Total Bilirubin: 0.53 mg/dL (ref 0.20–1.20)
Total Protein: 7.8 g/dL (ref 6.4–8.3)

## 2016-06-18 LAB — CBC WITH DIFFERENTIAL/PLATELET
BASO%: 0.2 % (ref 0.0–2.0)
Basophils Absolute: 0 10*3/uL (ref 0.0–0.1)
EOS%: 0.3 % (ref 0.0–7.0)
Eosinophils Absolute: 0 10*3/uL (ref 0.0–0.5)
HCT: 35 % (ref 34.8–46.6)
HGB: 12 g/dL (ref 11.6–15.9)
LYMPH%: 27.8 % (ref 14.0–49.7)
MCH: 35.3 pg — ABNORMAL HIGH (ref 25.1–34.0)
MCHC: 34.4 g/dL (ref 31.5–36.0)
MCV: 102.5 fL — ABNORMAL HIGH (ref 79.5–101.0)
MONO#: 0.2 10*3/uL (ref 0.1–0.9)
MONO%: 5.1 % (ref 0.0–14.0)
NEUT#: 3.1 10*3/uL (ref 1.5–6.5)
NEUT%: 66.6 % (ref 38.4–76.8)
Platelets: 196 10*3/uL (ref 145–400)
RBC: 3.41 10*6/uL — ABNORMAL LOW (ref 3.70–5.45)
RDW: 15.8 % — ABNORMAL HIGH (ref 11.2–14.5)
WBC: 4.7 10*3/uL (ref 3.9–10.3)
lymph#: 1.3 10*3/uL (ref 0.9–3.3)

## 2016-06-21 ENCOUNTER — Telehealth: Payer: Self-pay

## 2016-06-21 LAB — CA 125: Cancer Antigen (CA) 125: 27 U/mL (ref 0.0–38.1)

## 2016-06-21 MED FILL — RUBRACA 300 MG TAB: 300 | 30 days supply | Qty: 120 | Fill #0

## 2016-06-21 NOTE — Telephone Encounter (Signed)
Oral Chemotherapy Pharmacist Encounter  Received notification from [facility] that prior authorization of patient's Rubraca has been approved Effective dates: 06/14/16-12/10/16 [id number]  I have alerted [facility] to continue to process patient's prescription.  Oral Oncology Clinic will continue to follow . Johny Drilling, PharmD, BCPS, BCOP 06/21/2016  12:57 PM Oral Oncology Clinic [phone]

## 2016-06-21 NOTE — Telephone Encounter (Signed)
Pt requested ca 125 results. Given to her. She states she will discuss them with her upcoming Dr Denman George visit.

## 2016-06-23 ENCOUNTER — Ambulatory Visit: Payer: 59 | Attending: Gynecologic Oncology | Admitting: Gynecologic Oncology

## 2016-06-23 ENCOUNTER — Other Ambulatory Visit: Payer: Self-pay | Admitting: Oncology

## 2016-06-23 ENCOUNTER — Encounter: Payer: Self-pay | Admitting: Gynecologic Oncology

## 2016-06-23 VITALS — BP 110/59 | HR 68 | Temp 97.8°F | Resp 18 | Wt 120.1 lb

## 2016-06-23 DIAGNOSIS — C5701 Malignant neoplasm of right fallopian tube: Secondary | ICD-10-CM | POA: Insufficient documentation

## 2016-06-23 DIAGNOSIS — Z85828 Personal history of other malignant neoplasm of skin: Secondary | ICD-10-CM | POA: Insufficient documentation

## 2016-06-23 DIAGNOSIS — J45909 Unspecified asthma, uncomplicated: Secondary | ICD-10-CM | POA: Insufficient documentation

## 2016-06-23 DIAGNOSIS — Z9221 Personal history of antineoplastic chemotherapy: Secondary | ICD-10-CM | POA: Diagnosis not present

## 2016-06-23 DIAGNOSIS — D649 Anemia, unspecified: Secondary | ICD-10-CM | POA: Insufficient documentation

## 2016-06-23 DIAGNOSIS — Z87442 Personal history of urinary calculi: Secondary | ICD-10-CM | POA: Diagnosis not present

## 2016-06-23 DIAGNOSIS — C569 Malignant neoplasm of unspecified ovary: Secondary | ICD-10-CM

## 2016-06-23 NOTE — Patient Instructions (Signed)
Follow up with Dr. Jana Hakim as scheduled.  Please call for any questions or concerns.
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
## 2016-06-25 ENCOUNTER — Other Ambulatory Visit: Payer: 59

## 2016-06-25 ENCOUNTER — Other Ambulatory Visit: Payer: Self-pay | Admitting: Oncology

## 2016-06-25 ENCOUNTER — Ambulatory Visit: Payer: 59

## 2016-06-28 ENCOUNTER — Other Ambulatory Visit: Payer: Self-pay | Admitting: Oncology

## 2016-06-28 ENCOUNTER — Telehealth: Payer: Self-pay | Admitting: *Deleted

## 2016-06-28 DIAGNOSIS — C5701 Malignant neoplasm of right fallopian tube: Secondary | ICD-10-CM

## 2016-06-28 NOTE — Telephone Encounter (Signed)
Call from pt requesting a "full blood panel" to be drawn with next office visit. Pt wants to be sure her B12, Vit D and other levels are within range. Informed her I will send request to MD to discuss during 5/18 office visit. If additional labs are ordered they can be drawn during infusion. She voiced understanding.

## 2016-07-01 ENCOUNTER — Telehealth: Payer: Self-pay | Admitting: *Deleted

## 2016-07-01 MED ORDER — RUCAPARIB CAMSYLATE 300 MG PO TABS: 600.0000 mg | ORAL_TABLET | Freq: Two times a day (BID) | ORAL | 4 refills | Status: DC

## 2016-07-01 NOTE — Telephone Encounter (Signed)
Oral Chemotherapy Pharmacist Encounter  Received notification from [facility] that patient's Rubraca prescription would need to be filled through [facility] Prescription has been e-scribed to [facility] in [place], [place] (ph: [phone]). I have alerted my contact at [facility] to be on the look-out for prescription.  Oral Oncology Clinic will continue ot follow.  Johny Drilling, PharmD, BCPS, BCOP 07/01/2016  3:07 PM Oral Oncology Clinic [phone]

## 2016-07-01 NOTE — Telephone Encounter (Signed)
This RN received VM from pt stating " I am wanting to speak to someone about the Parp inhibitor I am supposed to be starting tomorrow " " have been on the phone most of the day with my insurance company "  Pt left return call number as [phone].  This RN contacted Ecologist with oral chemo and informed her of above with pt's request for a return call.  Evelena Peat stated per pt's insurance prescription has been authorized but has to be filled thru [facility] mail order pharmacy.  This RN attempted to contact pt and obtained identified VM- message left informing her of above.

## 2016-07-02 ENCOUNTER — Ambulatory Visit: Payer: 59

## 2016-07-02 ENCOUNTER — Other Ambulatory Visit (HOSPITAL_BASED_OUTPATIENT_CLINIC_OR_DEPARTMENT_OTHER): Payer: 59

## 2016-07-02 ENCOUNTER — Ambulatory Visit (HOSPITAL_BASED_OUTPATIENT_CLINIC_OR_DEPARTMENT_OTHER): Payer: 59 | Admitting: Oncology

## 2016-07-02 VITALS — BP 115/57 | HR 62 | Temp 98.1°F | Resp 20 | Ht 63.0 in | Wt 121.7 lb

## 2016-07-02 DIAGNOSIS — C801 Malignant (primary) neoplasm, unspecified: Secondary | ICD-10-CM

## 2016-07-02 DIAGNOSIS — C5701 Malignant neoplasm of right fallopian tube: Secondary | ICD-10-CM

## 2016-07-02 DIAGNOSIS — C762 Malignant neoplasm of abdomen: Secondary | ICD-10-CM

## 2016-07-02 DIAGNOSIS — C786 Secondary malignant neoplasm of retroperitoneum and peritoneum: Secondary | ICD-10-CM

## 2016-07-02 LAB — COMPREHENSIVE METABOLIC PANEL
ALT: 18 U/L (ref 0–55)
AST: 21 U/L (ref 5–34)
Albumin: 4.3 g/dL (ref 3.5–5.0)
Alkaline Phosphatase: 68 U/L (ref 40–150)
Anion Gap: 11 mEq/L (ref 3–11)
BUN: 7.6 mg/dL (ref 7.0–26.0)
CO2: 26 mEq/L (ref 22–29)
Calcium: 9.4 mg/dL (ref 8.4–10.4)
Chloride: 102 mEq/L (ref 98–109)
Creatinine: 0.7 mg/dL (ref 0.6–1.1)
EGFR: >90 mL/min/{1.73_m2} (ref >90)
Glucose: 91 mg/dl (ref 70–140)
Potassium: 4.1 mEq/L (ref 3.5–5.1)
Sodium: 138 mEq/L (ref 136–145)
Total Bilirubin: 0.45 mg/dL (ref 0.20–1.20)
Total Protein: 7.1 g/dL (ref 6.4–8.3)

## 2016-07-02 LAB — CBC WITH DIFFERENTIAL/PLATELET
BASO%: 0.3 % (ref 0.0–2.0)
Basophils Absolute: 0 10*3/uL (ref 0.0–0.1)
EOS%: 0.4 % (ref 0.0–7.0)
Eosinophils Absolute: 0 10*3/uL (ref 0.0–0.5)
HCT: 32.8 % — ABNORMAL LOW (ref 34.8–46.6)
HGB: 11.5 g/dL — ABNORMAL LOW (ref 11.6–15.9)
LYMPH%: 60.9 % — ABNORMAL HIGH (ref 14.0–49.7)
MCH: 36.6 pg — ABNORMAL HIGH (ref 25.1–34.0)
MCHC: 35.1 g/dL (ref 31.5–36.0)
MCV: 104.4 fL — ABNORMAL HIGH (ref 79.5–101.0)
MONO#: 0.2 10*3/uL (ref 0.1–0.9)
MONO%: 11.3 % (ref 0.0–14.0)
NEUT#: 0.6 10*3/uL — ABNORMAL LOW (ref 1.5–6.5)
NEUT%: 27.1 % — ABNORMAL LOW (ref 38.4–76.8)
Platelets: 259 10*3/uL (ref 145–400)
RBC: 3.14 10*6/uL — ABNORMAL LOW (ref 3.70–5.45)
RDW: 16 % — ABNORMAL HIGH (ref 11.2–14.5)
WBC: 2 10*3/uL — ABNORMAL LOW (ref 3.9–10.3)
lymph#: 1.2 10*3/uL (ref 0.9–3.3)

## 2016-07-02 MED ORDER — RUCAPARIB CAMSYLATE 300 MG PO TABS: 600.0000 mg | ORAL_TABLET | Freq: Two times a day (BID) | ORAL | 4 refills | Status: DC

## 2016-07-02 NOTE — Progress Notes (Signed)
[place]  [phone]) [phone] [phone]) [phone]     ID: [name] DOB: [birth year]  MR#: [id number]  XVQ#:[id number]  Patient Care Team: Harlan Stains, MD as PCP - General (Family Medicine) Gordy Levan, MD as Consulting Physician (Oncology) [name], MD as Consulting Physician (Obstetrics and Gynecology) Juanita Craver, MD as Consulting Physician (Gastroenterology) Servando Salina, MD as Consulting Physician (Obstetrics and Gynecology) Nickie Retort, MD as Consulting Physician (Urology) Gillis Ends, MD as Referring Physician (Obstetrics and Gynecology) Chauncey Cruel, MD OTHER MD:  CHIEF COMPLAINT: Recurrent ovarian cancer  CURRENT TREATMENT:rucaparib  NTERVAL HISTORY: Libra returns today for follow-up of her recurrent fallopian tube adenocarcinoma, accompanied by her husband. She completed her fourth cycle of carboplatin and liposomal doxorubicin 06/04/2016. Her CA 125 had normalized after her second cycle. It remains normal.  We restaged [place] with a CT scan of the abdomen and pelvis 06/17/2016 which showed no evidence of active disease. She then met with Dr. Denman George who concurred with my opinion that we could move at this point from chemotherapy to PA RP inhibitors. Ayleah is here today to discuss that transition  REVIEW OF SYSTEMS: Breindy has recovered well from her chemotherapy. She has excellent energy and a good functional status. She does yoga, did the [place] program, and otherwise exercises regularly. She complains of dry eyes. She is occasionally constipated. She has been able to travel, go skiing, and generally manages her anxiety well. She developed some "jabbing pains" and some stiff joints after her last treatment. She remains concerned about the port site metastases and wonders if we should do Doppler ultrasounds on those lesions to see with the vascularity is. She also met with Dr. Collene Mares who gave her some peppermint oil  which is helping her GI symptoms. She admits to anxiety. A detailed review of systems today was otherwise stable.   HISTORY OF PRESENT ILLNESS: From Dr. Serita Grit original intake note 03/17/2015:  "Denice Cardon is a very pleasant G4P4 who is seen in consultation at the request of Dr Collene Mares and Dr Garwin Brothers for peritoneal carcinomatosis. The patient has a history of a workup by urology for gross hematuria which included a pelvic examine was suggested for extrinsic mass effect on the bladder on cystoscopy. Cystoscopy took place on in December 2016. The patient denies abdominal pain bloating early satiety or abdominal distention.  On 08/03/2015 at [facility] she underwent a CT scan of the abdomen and pelvis as ordered by Dr Baruch Gouty. This revealed a 1.4 cm low attenuation lesion on the posterior right hepatic lobe suggestive of a hemangioma. Status post hysterectomy. No ovarian masses. Moderate ascites. Omental caking seen in the lateral left abdomen and pelvis. Peritoneal nodularity in the left lateral pelvic cul-de-sac. No gross extrinsic compression on the bladder was identified on imaging.  The patient was then seen by her gastroenterologist, Dr. Collene Mares, who performed a colonoscopy which was unremarkable.  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 She was treated adjuvantly with carboplatin and paclitaxel, as detailed below. Her CA 125 dropped from 1226 on 03/20/2015 to 26.1 by 06/16/2015.  Her subsequent history is as detailed below.  I PAST MEDICAL HISTORY: Past Medical History:  Diagnosis Date  .  Acute sensory neuropathy ([place]) 06/26/2015  . Allergy   .  Anemia   . Asthma    illness induced asthma  . Blood transfusion without reported diagnosis    at age 51 years old D/T surgery to femur being crushed  . 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 Heart murmur    pt, states had a "working heart murmur"  . History of kidney stones   . PONV (postoperative nausea and vomiting)   . Skin cancer     PAST SURGICAL HISTORY: Past Surgical History:  Procedure Laterality Date  . 3 laporscopic proceedures    . ABDOMINAL HYSTERECTOMY     2010  . CHOLECYSTECTOMY     2010  . DILATION AND CURETTAGE OF UTERUS     1997  . LAPAROSCOPY N/A 03/27/2015   Procedure: DIAGNOSTIC LAPAROSCOPY ;  Surgeon: [name], MD;  Location: [facility];  Service: Gynecology;  Laterality: N/A;  . LAPAROSCOPY N/A 02/24/2016   Procedure: LAPAROSCOPY DIAGNOSTIC WITH PERITONEAL WASHINGS AND PERITONEAL BIOPSY;  Surgeon: [name], MD;  Location: [facility];  Service: Gynecology;  Laterality: N/A;  . LAPAROTOMY N/A 03/27/2015   Procedure: EXPLORATORY LAPAROTOMY, BILATERAL SALPINGO OOPHORECTOMY, OMENTECTOMY, RADICAL TUMOR DEBULKING;  Surgeon: [name], MD;  Location: [facility];  Service: Gynecology;  Laterality: N/A;  . sinus surgery 1994      FAMILY HISTORY Family History  Problem Relation Age of Onset  . Hypertension Father   . Skin cancer Father        nonmelanoma skin cancers in his late 98s  . Non-Hodgkin's lymphoma Maternal Aunt        dx. 54s; smoker  . Stroke Maternal Grandmother   . Other Mother        benign meningioma dx. early-mid-70s; hysterectomy in her late 46s for heavy periods - still has ovaries  . Other Son        one son with pre-cancerous skin findings  . Other Daughter        cysts on ovaries and hx of heavy periods  . Other Sister        hysterectomy for cysts - still has ovaries  . Heart attack Maternal Grandfather   . Heart attack Paternal Grandfather   . Renal cancer Maternal  Aunt 60       smoker  . Breast cancer Maternal Aunt 78  . Other Maternal Aunt        dx. benign brain tumor (meningioma) at 10; 2nd benign brain tumor in her late 51s; hx of radical hysterectomy at age 50  . Brain cancer Other        NOS tumor  The patient's parents are both living, in their late 39s as of January 2018. The patient has one brother, 3 sisters. There is no history of ovarian cancer in the family. One maternal aunt had breast and kidney cancer, another had non-Hodgkin's lymphoma.  GYNECOLOGIC HISTORY:  No LMP recorded. Patient has had a hysterectomy. 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 Her husband [name] is a Engineer, maintenance (IT). Their children are 36, 2, 21 and 61 y/o as of JAN 2018. The oldest works as an Electrical engineer in the [employer] in [place]. The other 3 children live in [place], the youngest currently attending [employer]. Rhe patient  has no grandchildren. She attends [employer]    ADVANCED DIRECTIVES:    HEALTH MAINTENANCE: Social History  Substance Use Topics  . Smoking status: Never Smoker  . Smokeless tobacco: Never Used  . Alcohol use Yes     Comment:  3 glasses wine or beer/week     Colonoscopy:2016  PAP: s/p hyst  Bone density:   Allergies  Allergen Reactions  . Bee Venom Shortness Of Breath    swelling  . Sulfa Antibiotics Shortness Of Breath    Vomit , diarrhea , hives    Current Outpatient Prescriptions  Medication Sig Dispense Refill  . B COMPLEX VITAMINS SL Place 1 tablet under the tongue daily.    . clidinium-chlordiazePOXIDE (LIBRAX) 5-2.5 MG capsule Take 1 capsule by mouth at bedtime as needed (IBS symptoms). Reported on 08/11/2015  0  . dexamethasone (DECADRON) 4 MG tablet Take 2 tablets (8 mg total) by mouth daily. Start the day after chemotherapy for 2 days. 30 tablet 1  . gabapentin (NEURONTIN) 100 MG capsule Take 1 capsule  (100 mg total) by mouth at bedtime. Takes 157m at bedtime. Takes an additional 1045mduring the day if needed for nerve pain. 90 capsule 4  . ibuprofen (ADVIL,MOTRIN) 200 MG tablet Take 400 mg by mouth daily as needed for mild pain. Reported on 08/18/2015    . Lactobacillus (PROBIOTIC ACIDOPHILUS PO) Take 1 capsule by mouth every evening.    . Marland KitchenORazepam (ATIVAN) 0.5 MG tablet Take 0.5 mg by mouth at bedtime.    . Multiple Vitamin (MULTIVITAMIN WITH MINERALS) TABS tablet Take 1 tablet by mouth daily.    . ondansetron (ZOFRAN) 8 MG tablet Take 1 tablet (8 mg total) by mouth 2 (two) times daily as needed for refractory nausea / vomiting. Start on day 3 after chemo. 30 tablet 1  . Rucaparib Camsylate 300 MG TABS Take 600 mg by mouth 2 (two) times daily. 120 tablet 4  . senna (SENOKOT) 8.6 MG TABS tablet Take 1 tablet (8.6 mg total) by mouth at bedtime. 120 each 0  . traMADol (ULTRAM) 50 MG tablet Take 1-2 tablets (50-100 mg total) by mouth every 6 (six) hours as needed. 60 tablet 2  . TURMERIC PO Take 1 tablet by mouth daily.    . valACYclovir (VALTREX) 1000 MG tablet Take 1 tablet (1,000 mg total) by mouth 2 (two) times daily. 30 tablet 3   No current facility-administered medications for this visit.     OBJECTIVE: middle aged White woman In no acute distress  Vitals:   07/02/16 0941  BP: (!) 115/57  Pulse: 62  Resp: 20  Temp: 98.1 F (36.7 C)     Body mass index is 21.56 kg/m.    ECOG FS:1 - Symptomatic but completely ambulatory  Sclerae unicteric, EOMs intact Oropharynx clear and moist No cervical or supraclavicular adenopathy Lungs no rales or rhonchi Heart regular rate and rhythm Abd soft, nontender, positive bowel sounds MSK no focal spinal tenderness, no upper extremity lymphedema Neuro: nonfocal, well oriented, appropriate affect Breasts: Deferred Skin: The port site metastases are flat. The one in the right lower quadrant is the only one that is slightly more palpable. This is  entirely consistent with scar tissue though of course residual tumor there cannot be willed out   Photo of right lower quadrant port-site metastases 03/15/2016    Photo 04/02/2016    LAB RESULTS:  CMP     Component Value Date/Time   NA 143 06/18/2016 0907  K 3.8 06/18/2016 0907   CL 104 06/18/2015 1248   CO2 27 06/18/2016 0907   GLUCOSE 104 06/18/2016 0907   BUN 8.3 06/18/2016 0907   CREATININE 0.7 06/18/2016 0907   CALCIUM 10.0 06/18/2016 0907   PROT 7.8 06/18/2016 0907   ALBUMIN 4.6 06/18/2016 0907   AST 26 06/18/2016 0907   ALT 38 06/18/2016 0907   ALKPHOS 95 06/18/2016 0907   BILITOT 0.53 06/18/2016 0907   GFRNONAA >60 06/18/2015 1248   GFRAA >60 06/18/2015 1248    INo results found for: SPEP, UPEP  Lab Results  Component Value Date   WBC 2.0 (L) 07/02/2016   NEUTROABS 0.6 (L) 07/02/2016   HGB 11.5 (L) 07/02/2016   HCT 32.8 (L) 07/02/2016   MCV 104.4 (H) 07/02/2016   PLT 259 07/02/2016      Chemistry      Component Value Date/Time   NA 143 06/18/2016 0907   K 3.8 06/18/2016 0907   CL 104 06/18/2015 1248   CO2 27 06/18/2016 0907   BUN 8.3 06/18/2016 0907   CREATININE 0.7 06/18/2016 0907      Component Value Date/Time   CALCIUM 10.0 06/18/2016 0907   ALKPHOS 95 06/18/2016 0907   AST 26 06/18/2016 0907   ALT 38 06/18/2016 0907   BILITOT 0.53 06/18/2016 0907       No results found for: LABCA2  No components found for: [id number]  No results for input(s): INR in the last 168 hours.  Urinalysis    Component Value Date/Time   COLORURINE YELLOW 03/29/2015 0920   APPEARANCEUR CLEAR 03/29/2015 0920   LABSPEC 1.005 10/09/2015 1239   PHURINE 6.0 10/09/2015 1239   PHURINE 7.5 03/29/2015 0920   GLUCOSEU Negative 10/09/2015 1239   HGBUR Negative 10/09/2015 1239   HGBUR SMALL (A) 03/29/2015 0920   BILIRUBINUR Negative 10/09/2015 1239   KETONESUR Negative 10/09/2015 1239   KETONESUR NEGATIVE 03/29/2015 0920   PROTEINUR Negative 10/09/2015 1239    PROTEINUR NEGATIVE 03/29/2015 0920   UROBILINOGEN 0.2 10/09/2015 1239   NITRITE Negative 10/09/2015 1239   NITRITE NEGATIVE 03/29/2015 0920   LEUKOCYTESUR Negative 10/09/2015 1239     STUDIES: CT scan of the abdomen and pelvis results were discussed with the patient.   ELIGIBLE FOR AVAILABLE RESEARCH PROTOCOL: no  ASSESSMENT: 51 y.o. [place] woman status post radical tumor debulking with optimal cytoreduction (R0) 03/27/2015 for a stage IIIC, high-grade right fallopian tube carcinoma  (a) baseline CA-125 was1226.  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)  (1) adjuvant chemotherapy consisted of carboplatin and paclitaxel for 6 doses, begun 04/14/2015, completed 08/11/2015  (a) paclitaxel was omitted from cycle 5 and dose reduced on cycle 6 because of neuropathy   (b) a "make up" dose of paclitaxel was given 08/25/2015  (c) last carboplatin dose was 08/11/2015  (d) CA-125 normalized by 06/16/2015   (2) FIRST RECURRENCE: January 2018  (a) CA-125 rise beginning November 2017 led to CT scans and PET scans December 2017, all negative  (b) exploratory laparotomy 02/24/2016 showed pathologically confirmed recurrence, with miliary disease involving all examined surfaces  (c) port-site metastases noted 03/08/2016  (3) carboplatin/liposomal doxorubicin started 03/05/2016, repeated every three weeks x4, completed 06/04/2016.  (a) cycle 3 delayed one week because of neutropenia; OnPro added  (b) RUCAPARIB started 07/02/2016  PLAN I spent approximately 30 minutes with Lelan Pons with most  of that time spent discussing her overall situation and plan. She had thought she would receive more chemotherapy, but actually her counts are still low, and both Dr. Denman George and myself are comfortable that she is currently in clinical  remission and we are ready to move to maintenance.  A second source of discomfort for Eyanna is the laparoscopy site recurrences. She wondered if we should do Doppler ultrasonography on those lesions. That actually is a creative idea, but we don't have any baseline so I would not know how to interpret that. I think palpation is as informative as anything and right now the lesions are very flat and very minimal and so is a remain that way I don't think any further evaluation will be needed for those particular concerns.  A third of the area of concern is the fact that Dr. Denman George had mentioned niraparib in her current note and we are going with rucaparib--that actually is the one that Dr. Denman George suggested in her 04/22/2016 note and that is the one that we have obtained for her. I reassured Stacyann that as far as we know all these PARP inhibitors have very similar activity and side effects.  She has been able to obtain this drug for $65 which is a good price. I gave her a copy of the patient information sheet from up-to-date and I have asked her to make a diary of side effects and call me with any persistent problems.  Otherwise I will see her again on 07/16/2016. We will repeat lab work at that time. If everything is going well I will start seeing her on a monthly basis and plan to restage her with a CT of the abdomen and pelvis in approximately 3 months  She knows to call for any problems that may develop before her next visit.   :Chauncey Cruel, MD   07/02/2016 10:04 AM Medical Oncology and Hematology [facility] [place], [place] [address] Tel. [phone]    Fax. [phone]

## 2016-07-02 NOTE — Telephone Encounter (Signed)
Oral Chemotherapy Pharmacist Encounter   I spoke with patient for overview of new oral chemotherapy medication: Rubraca. Pt is doing well. The first fill was dispensed today (5/18) from [facility] with insurance override. Subsequent fills will be performed at [facility] in [place], [place] per insurance requirement.    Start date: 07/02/16 Counseled patient on administration, dosing, side effects, safe handling, and monitoring. Patient will take 2 tablets (600mg  total) by mouth 2 times daily without regard to food.  Side effects include but not limited to: N/V/D/C, rash, fatigue, decreased blood counts, mouth sores, and photosensitivity.  Mrs. Quirk voiced understanding and appreciation.   I provided Mrs. Neubauer with the phone number to [facility] [phone]) and instructed her to call the pharmacy 7-10 days before she needs next fill to ensure there are no delays in treatment.  All questions answered.  Patient knows to call the office with questions or concerns.  Thank you,  Johny Drilling, PharmD, BCPS, BCOP 07/02/2016  2:50 PM Oral Oncology Clinic [phone]

## 2016-07-02 NOTE — Telephone Encounter (Signed)
Oral Chemotherapy Pharmacist Encounter  Received notification from [facility] this morning that Rubraca is a limited distribution medication and they do not have access to dispense it. Prescription e-scribed to [facility] in [place], [place] (ph: [phone]). I also faxed demographic and insurance information, as well as PA approval documentation to help set-up patient's account with the pharmacy. I then called [facility], confirmed receipt of the prescription to the pharmacy, and asked that the Rx be expedited.  [facility] will call the Oral Oncology Clinic later today with a status update of Rubraca prescription.  Oral Oncology Clinic will continue to follow.  Johny Drilling, PharmD, BCPS, BCOP 07/02/2016  9:39 AM Oral Oncology Clinic [phone]

## 2016-07-03 LAB — VITAMIN B12: Vitamin B12: >2000 pg/mL — ABNORMAL HIGH (ref 232–1245)

## 2016-07-03 LAB — VITAMIN D 25 HYDROXY (VIT D DEFICIENCY, FRACTURES): Vitamin D, 25-Hydroxy: 33.4 ng/mL (ref 30.0–100.0)

## 2016-07-03 LAB — CA 125: Cancer Antigen (CA) 125: 22.5 U/mL (ref 0.0–38.1)

## 2016-07-06 ENCOUNTER — Telehealth: Payer: Self-pay | Admitting: *Deleted

## 2016-07-06 NOTE — Telephone Encounter (Signed)
Patient call inquiring about recent labs. RN informed her of labs and that her ANC: 0.6 Vitamin B12: greater than 2000. Patient is currently taking her Vitamin B12 daily.

## 2016-07-07 ENCOUNTER — Telehealth: Payer: Self-pay | Admitting: *Deleted

## 2016-07-07 NOTE — Telephone Encounter (Signed)
"  Dr. Jana Hakim asked that I call if I notice changes or anything persistent with this new drug I started last week, Rucaparib.  I do not feel so great.  Have not slept well the past three days.  I don't know if the dose is too strong or if these are normal side effects.  I'm not good at describing symptoms but something intestinal is going on that's high up.  Feels like a lump in my throat with a sense of choking feeling.  No trouble talking but a little trouble swallowing.  My stomach aches with abdomen and lower back pressure.  Everything taste horrible now.  Belching, burping a little.  I've never experienced nausea or vomiting so maybe this is nausea.  My bowels are consistent.  Constipation is not any worse.  I'm really tired by 9:00 pm.  I see him next Friday (07-16-2016) but want it noted in my chart.  These symptoms are not going away."

## 2016-07-07 NOTE — Telephone Encounter (Signed)
This RN spoke with pt per her symptoms.   Per discussion pt understands symptoms are likely related to the new medication.  Symptoms may benefit with use of pepcid - which pt will obtain.  She understands the need for good hydration and she is trying different " flavors " of drinks ( powders ) for least taste disturbance.  Per her concern regarding dosing - goal is to find greatest benefit with least side effects.  Kayson will initiate above recommendations and call with update tomorrow.

## 2016-07-08 ENCOUNTER — Encounter: Payer: Self-pay | Admitting: Oncology

## 2016-07-16 ENCOUNTER — Encounter: Payer: Self-pay | Admitting: Gynecologic Oncology

## 2016-07-16 ENCOUNTER — Other Ambulatory Visit (HOSPITAL_BASED_OUTPATIENT_CLINIC_OR_DEPARTMENT_OTHER): Payer: 59

## 2016-07-16 ENCOUNTER — Ambulatory Visit (HOSPITAL_BASED_OUTPATIENT_CLINIC_OR_DEPARTMENT_OTHER): Payer: 59 | Admitting: Oncology

## 2016-07-16 ENCOUNTER — Other Ambulatory Visit: Payer: Self-pay | Admitting: *Deleted

## 2016-07-16 ENCOUNTER — Other Ambulatory Visit: Payer: Self-pay | Admitting: Medical Oncology

## 2016-07-16 VITALS — BP 131/56 | HR 60 | Temp 98.0°F | Resp 18 | Ht 63.0 in | Wt 123.2 lb

## 2016-07-16 DIAGNOSIS — C5701 Malignant neoplasm of right fallopian tube: Secondary | ICD-10-CM

## 2016-07-16 DIAGNOSIS — C762 Malignant neoplasm of abdomen: Secondary | ICD-10-CM

## 2016-07-16 DIAGNOSIS — T451X5A Adverse effect of antineoplastic and immunosuppressive drugs, initial encounter: Secondary | ICD-10-CM

## 2016-07-16 DIAGNOSIS — C801 Malignant (primary) neoplasm, unspecified: Secondary | ICD-10-CM

## 2016-07-16 DIAGNOSIS — K219 Gastro-esophageal reflux disease without esophagitis: Secondary | ICD-10-CM | POA: Diagnosis not present

## 2016-07-16 DIAGNOSIS — C786 Secondary malignant neoplasm of retroperitoneum and peritoneum: Secondary | ICD-10-CM | POA: Diagnosis not present

## 2016-07-16 DIAGNOSIS — D701 Agranulocytosis secondary to cancer chemotherapy: Secondary | ICD-10-CM

## 2016-07-16 DIAGNOSIS — G608 Other hereditary and idiopathic neuropathies: Secondary | ICD-10-CM

## 2016-07-16 LAB — COMPREHENSIVE METABOLIC PANEL
ALT: 24 U/L (ref 0–55)
AST: 25 U/L (ref 5–34)
Albumin: 4.4 g/dL (ref 3.5–5.0)
Alkaline Phosphatase: 72 U/L (ref 40–150)
Anion Gap: 11 mEq/L (ref 3–11)
BUN: 8.8 mg/dL (ref 7.0–26.0)
CO2: 26 mEq/L (ref 22–29)
Calcium: 9.9 mg/dL (ref 8.4–10.4)
Chloride: 104 mEq/L (ref 98–109)
Creatinine: 0.8 mg/dL (ref 0.6–1.1)
EGFR: 85 mL/min/{1.73_m2} — ABNORMAL LOW (ref >90)
Glucose: 78 mg/dl (ref 70–140)
Potassium: 3.7 mEq/L (ref 3.5–5.1)
Sodium: 140 mEq/L (ref 136–145)
Total Bilirubin: 0.39 mg/dL (ref 0.20–1.20)
Total Protein: 7.4 g/dL (ref 6.4–8.3)

## 2016-07-16 LAB — CBC WITH DIFFERENTIAL/PLATELET
BASO%: 0.5 % (ref 0.0–2.0)
Basophils Absolute: 0 10*3/uL (ref 0.0–0.1)
EOS%: 0.5 % (ref 0.0–7.0)
Eosinophils Absolute: 0 10*3/uL (ref 0.0–0.5)
HCT: 34.5 % — ABNORMAL LOW (ref 34.8–46.6)
HGB: 11.8 g/dL (ref 11.6–15.9)
LYMPH%: 30.7 % (ref 14.0–49.7)
MCH: 35.5 pg — ABNORMAL HIGH (ref 25.1–34.0)
MCHC: 34.2 g/dL (ref 31.5–36.0)
MCV: 103.9 fL — ABNORMAL HIGH (ref 79.5–101.0)
MONO#: 0.3 10*3/uL (ref 0.1–0.9)
MONO%: 7.7 % (ref 0.0–14.0)
NEUT#: 2.3 10*3/uL (ref 1.5–6.5)
NEUT%: 60.6 % (ref 38.4–76.8)
Platelets: 221 10*3/uL (ref 145–400)
RBC: 3.32 10*6/uL — ABNORMAL LOW (ref 3.70–5.45)
RDW: 13.4 % (ref 11.2–14.5)
WBC: 3.8 10*3/uL — ABNORMAL LOW (ref 3.9–10.3)
lymph#: 1.2 10*3/uL (ref 0.9–3.3)

## 2016-07-16 MED ORDER — GABAPENTIN 100 MG PO CAPS: 100.0000 mg | ORAL_CAPSULE | Freq: Every day | ORAL | 4 refills | Status: DC

## 2016-07-16 MED ORDER — OMEPRAZOLE 40 MG PO CPDR: 40.0000 mg | DELAYED_RELEASE_CAPSULE | Freq: Every day | ORAL | 4 refills | Status: DC

## 2016-07-16 NOTE — Progress Notes (Signed)
[name]  [phone] Fax:(336) [phone]     ID: Kathryn Ramsey DOB: Jan 04, 1966  MR#: [id number]  WGY#:[id number]  Patient Care Team: Harlan Stains, MD as PCP - General (Family Medicine) Gordy Levan, MD as Consulting Physician (Oncology) [name], MD as Consulting Physician (Obstetrics and Gynecology) Juanita Craver, MD as Consulting Physician (Gastroenterology) Servando Salina, MD as Consulting Physician (Obstetrics and Gynecology) Nickie Retort, MD as Consulting Physician (Urology) Gillis Ends, MD as Referring Physician (Obstetrics and Gynecology) Chauncey Cruel, MD OTHER MD:  CHIEF COMPLAINT: Recurrent ovarian cancer  CURRENT TREATMENT: rucaparib  NTERVAL HISTORY: Kathryn Ramsey returns today for follow-up of her recurrent ovarian cancer. She is currently on maintenance treatment with root.. She generally seems to be tolerating it well but she has a variety of symptoms which she feels may be related to the medication.  She tells me her hair and nails are great, but her GI system is not. She has nausea at times. She hasn't not in her throat, which is not associated with difficulty swallowing or pain on swallowing. Occasionally she has hiccups. She has significant reflux symptoms. Despite this she has actually gained a couple of pounds over the last 2 visits. She has bowel movements about every other day, and they are soft. She complains that water tastes bitter. Her bili is painful at times particularly in the right flank area. This can be worse when she takes a deep breath. She feels very weepy and wonders "is this going to be the way I feel the rest of my life"?  REVIEW OF SYSTEMS: Aside from issues above, Kathryn Ramsey is exercising vigorously by walking doing yoga and going to the gym. A detailed review of systems today was otherwise noncontributory  HISTORY OF PRESENT ILLNESS: From Dr. Serita Grit original intake note 03/17/2015:  "Kathryn Ramsey is a very pleasant G4P4 who is seen in consultation at the request of Dr Collene Mares and Dr Garwin Brothers for peritoneal carcinomatosis. The patient has a history of a workup by urology for gross hematuria which included a pelvic examine was suggested for extrinsic mass effect on the bladder on cystoscopy. Cystoscopy took place on in December 2016. The patient denies abdominal pain bloating early satiety or abdominal distention.  On 08/03/2015 at [facility] she underwent a CT scan of the abdomen and pelvis as ordered by Dr Baruch Gouty. This revealed a 1.4 cm low attenuation lesion on the posterior right hepatic lobe suggestive of a hemangioma. Status post hysterectomy. No ovarian masses. Moderate ascites. Omental caking seen in the lateral left abdomen and pelvis. Peritoneal nodularity in the left lateral pelvic cul-de-sac. No gross extrinsic compression on the bladder was identified on imaging.  The patient was then seen by her gastroenterologist, Dr. Collene Mares, who performed a colonoscopy which was unremarkable.  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 She was treated adjuvantly with carboplatin and paclitaxel, as detailed below. Her CA 125 dropped from 1226 on 03/20/2015 to 26.1 by 06/16/2015.  Her subsequent history is as detailed below.  I PAST MEDICAL HISTORY: Past Medical History:  Diagnosis Date  . Acute sensory neuropathy ([place]) 06/26/2015  . Allergy   . Anemia   . Asthma  illness induced asthma  . Blood transfusion without reported diagnosis    at age 51 years old D/T surgery to femur being crushed  . Complication of anesthesia    hx. of  allergic to ether (had surgery at 5 years and had reaction to the ether  . Heart murmur    pt, states had a "working heart murmur"  . History of kidney stones   . PONV (postoperative nausea and vomiting)   . Skin cancer     PAST SURGICAL HISTORY: Past Surgical History:  Procedure Laterality Date  . 3 laporscopic proceedures    . ABDOMINAL HYSTERECTOMY     2010  . CHOLECYSTECTOMY     2010  . DILATION AND CURETTAGE OF UTERUS     1997  . LAPAROSCOPY N/A 03/27/2015   Procedure: DIAGNOSTIC LAPAROSCOPY ;  Surgeon: [name], MD;  Location: [facility];  Service: Gynecology;  Laterality: N/A;  . LAPAROSCOPY N/A 02/24/2016   Procedure: LAPAROSCOPY DIAGNOSTIC WITH PERITONEAL WASHINGS AND PERITONEAL BIOPSY;  Surgeon: [name], MD;  Location: [facility];  Service: Gynecology;  Laterality: N/A;  . LAPAROTOMY N/A 03/27/2015   Procedure: EXPLORATORY LAPAROTOMY, BILATERAL SALPINGO OOPHORECTOMY, OMENTECTOMY, RADICAL TUMOR DEBULKING;  Surgeon: [name], MD;  Location: [facility];  Service: Gynecology;  Laterality: N/A;  . sinus surgery 1994      FAMILY HISTORY Family History  Problem Relation Age of Onset  . Hypertension Father   . Skin cancer Father        nonmelanoma skin cancers in his late 59s  . Non-Hodgkin's lymphoma Maternal Aunt        dx. 68s; smoker  . Stroke Maternal Grandmother   . Other Mother        benign meningioma dx. early-mid-70s; hysterectomy in her late 73s for heavy periods - still has ovaries  . Other Son        one son with pre-cancerous skin findings  . Other Daughter        cysts on ovaries and hx of heavy periods  . Other Sister        hysterectomy for cysts - still has ovaries  . Heart attack Maternal Grandfather   . Heart attack Paternal Grandfather   . Renal cancer Maternal Aunt 60       smoker  . Breast cancer Maternal Aunt 78  . Other Maternal Aunt        dx. benign brain tumor (meningioma) at 38; 2nd benign brain tumor in her late 74s; hx of radical hysterectomy at  age 4  . Brain cancer Other        NOS tumor  The patient's parents are both living, in their late 60s as of January 2018. The patient has one brother, 3 sisters. There is no history of ovarian cancer in the family. One maternal aunt had breast and kidney cancer, another had non-Hodgkin's lymphoma.  GYNECOLOGIC HISTORY:  No LMP recorded. Patient has had a hysterectomy. 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 Her husband [name] is a Engineer, maintenance (IT). Their children are 15, 102, 6 and 45 y/o as of JAN 2018. The oldest works as an Electrical engineer in the [employer] in [place]. The other 3 children live in [place], the youngest currently attending [employer]. Rhe patient has no grandchildren. She attends [employer]    ADVANCED DIRECTIVES:    HEALTH MAINTENANCE: Social History  Substance Use Topics  . Smoking status: Never Smoker  . Smokeless tobacco: Never Used  . Alcohol use Yes     Comment:  3 glasses wine or beer/week     Colonoscopy:2016  PAP: s/p hyst  Bone density:   Allergies  Allergen Reactions  . Bee Venom Shortness Of Breath    swelling  . Sulfa Antibiotics Shortness Of Breath    Vomit , diarrhea , hives    Current Outpatient Prescriptions  Medication Sig Dispense Refill  . B COMPLEX VITAMINS SL Place 1 tablet under the tongue daily.    . clidinium-chlordiazePOXIDE (LIBRAX) 5-2.5 MG capsule Take 1 capsule by mouth at bedtime as needed (IBS symptoms). Reported on 08/11/2015  0  . dexamethasone (DECADRON) 4 MG tablet Take 2 tablets (8 mg total) by mouth daily. Start the day after chemotherapy for 2 days. 30 tablet 1  . gabapentin (NEURONTIN) 100 MG capsule Take 1 capsule (100 mg total) by mouth at bedtime. Takes 125m at bedtime. Takes an additional 1033mduring the day if needed for nerve pain. 90 capsule 4  . ibuprofen (ADVIL,MOTRIN) 200 MG tablet Take 400 mg by mouth  daily as needed for mild pain. Reported on 08/18/2015    . Lactobacillus (PROBIOTIC ACIDOPHILUS PO) Take 1 capsule by mouth every evening.    . Marland KitchenORazepam (ATIVAN) 0.5 MG tablet Take 0.5 mg by mouth at bedtime.    . Multiple Vitamin (MULTIVITAMIN WITH MINERALS) TABS tablet Take 1 tablet by mouth daily.    . ondansetron (ZOFRAN) 8 MG tablet Take 1 tablet (8 mg total) by mouth 2 (two) times daily as needed for refractory nausea / vomiting. Start on day 3 after chemo. 30 tablet 1  . Rucaparib Camsylate 300 MG TABS Take 600 mg by mouth 2 (two) times daily. 120 tablet 4  . senna (SENOKOT) 8.6 MG TABS tablet Take 1 tablet (8.6 mg total) by mouth at bedtime. 120 each 0  . traMADol (ULTRAM) 50 MG tablet Take 1-2 tablets (50-100 mg total) by mouth every 6 (six) hours as needed. 60 tablet 2  . TURMERIC PO Take 1 tablet by mouth daily.    . valACYclovir (VALTREX) 1000 MG tablet Take 1 tablet (1,000 mg total) by mouth 2 (two) times daily. 30 tablet 3   No current facility-administered medications for this visit.     OBJECTIVE: middle aged White woman Who appears stated age  Vi65  07/16/16 1002  BP: (!) 131/56  Pulse: 60  Resp: 18  Temp: 98 F (36.7 C)     Body mass index is 21.82 kg/m.    ECOG FS:1 - Symptomatic but completely ambulatory  Sclerae unicteric, pupils round and equal Oropharynx clear and moist No cervical or supraclavicular adenopathy Lungs no rales or rhonchi Heart regular rate and rhythm Abd soft, nontender, positive bowel sounds MSK no focal spinal tenderness, no upper extremity lymphedema Neuro: nonfocal, well oriented, appropriate affect Breasts: Deferred  Skin: The port metastases are essentially flat, including the one in the right lower quadrant under which one still can feel some induration  Photo of right lower quadrant port-site metastases 03/15/2016    Photo 04/02/2016    LAB RESULTS:  CMP     Component Value Date/Time   NA 138 07/02/2016 0926   K 4.1  07/02/2016 0926   CL 104 06/18/2015 1248   CO2 26 07/02/2016 0926   GLUCOSE 91 07/02/2016 0926   BUN 7.6 07/02/2016 0926   CREATININE 0.7 07/02/2016 0926  CALCIUM 9.4 07/02/2016 0926   PROT 7.1 07/02/2016 0926   ALBUMIN 4.3 07/02/2016 0926   AST 21 07/02/2016 0926   ALT 18 07/02/2016 0926   ALKPHOS 68 07/02/2016 0926   BILITOT 0.45 07/02/2016 0926   GFRNONAA >60 06/18/2015 1248   GFRAA >60 06/18/2015 1248    INo results found for: SPEP, UPEP  Lab Results  Component Value Date   WBC 3.8 (L) 07/16/2016   NEUTROABS 2.3 07/16/2016   HGB 11.8 07/16/2016   HCT 34.5 (L) 07/16/2016   MCV 103.9 (H) 07/16/2016   PLT 221 07/16/2016      Chemistry      Component Value Date/Time   NA 138 07/02/2016 0926   K 4.1 07/02/2016 0926   CL 104 06/18/2015 1248   CO2 26 07/02/2016 0926   BUN 7.6 07/02/2016 0926   CREATININE 0.7 07/02/2016 0926      Component Value Date/Time   CALCIUM 9.4 07/02/2016 0926   ALKPHOS 68 07/02/2016 0926   AST 21 07/02/2016 0926   ALT 18 07/02/2016 0926   BILITOT 0.45 07/02/2016 0926       No results found for: LABCA2  No components found for: LABCA125  No results for input(s): INR in the last 168 hours.  Urinalysis    Component Value Date/Time   COLORURINE YELLOW 03/29/2015 0920   APPEARANCEUR CLEAR 03/29/2015 0920   LABSPEC 1.005 10/09/2015 1239   PHURINE 6.0 10/09/2015 1239   PHURINE 7.5 03/29/2015 0920   GLUCOSEU Negative 10/09/2015 1239   HGBUR Negative 10/09/2015 1239   HGBUR SMALL (A) 03/29/2015 0920   BILIRUBINUR Negative 10/09/2015 1239   KETONESUR Negative 10/09/2015 1239   KETONESUR NEGATIVE 03/29/2015 0920   PROTEINUR Negative 10/09/2015 1239   PROTEINUR NEGATIVE 03/29/2015 0920   UROBILINOGEN 0.2 10/09/2015 1239   NITRITE Negative 10/09/2015 1239   NITRITE NEGATIVE 03/29/2015 0920   LEUKOCYTESUR Negative 10/09/2015 1239     STUDIES: No results found.  ELIGIBLE FOR AVAILABLE RESEARCH PROTOCOL: no  ASSESSMENT: 51 y.o.  [place] woman status post radical tumor debulking with optimal cytoreduction (R0) 03/27/2015 for a stage IIIC, high-grade right fallopian tube carcinoma  (a) baseline CA-125 was1226.  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)  (1) adjuvant chemotherapy consisted of carboplatin and paclitaxel for 6 doses, begun 04/14/2015, completed 08/11/2015  (a) paclitaxel was omitted from cycle 5 and dose reduced on cycle 6 because of neuropathy   (b) a "make up" dose of paclitaxel was given 08/25/2015  (c) last carboplatin dose was 08/11/2015  (d) CA-125 normalized by 06/16/2015   (2) FIRST RECURRENCE: January 2018  (a) CA-125 rise beginning November 2017 led to CT scans and PET scans December 2017, all negative  (b) exploratory laparotomy 02/24/2016 showed pathologically confirmed recurrence, with miliary disease involving all examined surfaces  (c) port-site metastases noted 03/08/2016  (3) carboplatin/liposomal doxorubicin started 03/05/2016, repeated every three weeks x4, completed 06/04/2016.  (a) cycle 3 delayed one week because of neutropenia; OnPro added  (4) RUCAPARIB started 07/02/2016  PLAN I spent approximately 30 minutes with Kathryn Ramsey with most of that time spent discussing her complex problems. I think there are several things going on at the same time and of course it is always difficult to sort these out.  One of the things that going on is the fact that Kathryn Ramsey has metastatic cancer. This is  not curable. She would like to be completely normal and care free and back the way she was and that is not happening. Taking rucaparib twice a day is a consistent reminder that there is a threat and an ongoing problem. Quite aside from any side effects of the medication, psychologically this is very difficult for her.  She has a  variety of abdominal concerns which are very likely going to be due to either residual microscopic cancer in her abdomen or scarring from the earlier cancer and its treatment. These are likely to continue. While it is possible that some of these problems are related to the rucaparic, they are much more likely to be related to her carcinomatosis.  She definitively has reflux symptoms. This is something we should be able to improve with standard treatment. I am starting her on omeprazole 40 mg in asking her to take it every night for the next 3 weeks to see if those symptoms improve.  The altered taste may be related to her medication but this is very common with chemotherapy and in my experience it continues for 2-4 months after chemotherapy is stopped. We discussed some strategies that she can try (for example switching to herbal teas or 2 soda water instead of plain water). She is doing a good job of keeping herself well hydrated. She is also doing a great job at exercise.  Finally it is normal to be "weepy" as she puts it. She is going through a posttraumatic stress. 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 The treatment in general is antidepressants and I specifically offered her venlafaxine. She is concerned that it may cause weight gain and other symptoms but I have found that at very low doses for example 37.5 mg daily, it is usually not a problem.  At this point she wishes to defer on the venlafaxine. She is going to give the omeprazole a try. I strongly urged her to continue on the rucaparib at the current dose since even if this medication does cause some side effects they are much less than the side effects she will experience when they ovarian cancer returns, and this should help extend the chemotherapy free period.  She was also concerned that her B-12 level was high. That is being repeated today  She will see me again at the end of  this month. She knows to call for any other problems that may develop before that visit.   :Chauncey Cruel, MD   07/16/2016 10:06 AM Medical Oncology and Hematology [facility] [place], [place] [address] Tel. [phone]    Fax. [phone]

## 2016-07-17 LAB — VITAMIN B12: Vitamin B12: >2000 pg/mL — ABNORMAL HIGH (ref 232–1245)

## 2016-07-17 LAB — CA 125: Cancer Antigen (CA) 125: 23 U/mL (ref 0.0–38.1)

## 2016-07-27 ENCOUNTER — Other Ambulatory Visit: Payer: Self-pay

## 2016-07-30 ENCOUNTER — Other Ambulatory Visit: Payer: 59

## 2016-07-30 ENCOUNTER — Ambulatory Visit: Payer: 59

## 2016-08-06 ENCOUNTER — Telehealth: Payer: Self-pay | Admitting: *Deleted

## 2016-08-06 NOTE — Telephone Encounter (Signed)
"  I started Rucaparib five to six weeks ago.  Don't think it's related to this medication but want you all to know.  Having weird, achy, persistent left shoulder pain for the past week.  When I lift my arm forward it bothers.  I had to lift it up with my right arm to drive.  It wakes me up at night. I can't lie or sleep on the left side.  I started Ibuprofen yesterday and it helped a little.  Left shoulder pain equals eight on pain scale.  With ibuprofen I'll rate it about a four.  Weird because I've no been lifting, pulling or injury.  Nothing visibly wrong.  No redness, warmth or swelling."  Will notify provider.  Next scheduled F/U 08-13-2016.

## 2016-08-11 ENCOUNTER — Other Ambulatory Visit (HOSPITAL_BASED_OUTPATIENT_CLINIC_OR_DEPARTMENT_OTHER): Payer: 59

## 2016-08-11 DIAGNOSIS — C5701 Malignant neoplasm of right fallopian tube: Secondary | ICD-10-CM

## 2016-08-11 DIAGNOSIS — C786 Secondary malignant neoplasm of retroperitoneum and peritoneum: Secondary | ICD-10-CM

## 2016-08-11 DIAGNOSIS — C762 Malignant neoplasm of abdomen: Secondary | ICD-10-CM

## 2016-08-11 DIAGNOSIS — C801 Malignant (primary) neoplasm, unspecified: Secondary | ICD-10-CM

## 2016-08-11 LAB — COMPREHENSIVE METABOLIC PANEL
ALT: 21 U/L (ref 0–55)
AST: 22 U/L (ref 5–34)
Albumin: 4.6 g/dL (ref 3.5–5.0)
Alkaline Phosphatase: 75 U/L (ref 40–150)
Anion Gap: 11 mEq/L (ref 3–11)
BUN: 9.4 mg/dL (ref 7.0–26.0)
CO2: 28 mEq/L (ref 22–29)
Calcium: 10.1 mg/dL (ref 8.4–10.4)
Chloride: 101 mEq/L (ref 98–109)
Creatinine: 0.8 mg/dL (ref 0.6–1.1)
EGFR: 83 mL/min/{1.73_m2} — ABNORMAL LOW (ref >90)
Glucose: 94 mg/dl (ref 70–140)
Potassium: 3.7 mEq/L (ref 3.5–5.1)
Sodium: 141 mEq/L (ref 136–145)
Total Bilirubin: 0.49 mg/dL (ref 0.20–1.20)
Total Protein: 7.8 g/dL (ref 6.4–8.3)

## 2016-08-11 LAB — CBC WITH DIFFERENTIAL/PLATELET
BASO%: 0.6 % (ref 0.0–2.0)
Basophils Absolute: 0 10*3/uL (ref 0.0–0.1)
EOS%: 1.4 % (ref 0.0–7.0)
Eosinophils Absolute: 0.1 10*3/uL (ref 0.0–0.5)
HCT: 36.3 % (ref 34.8–46.6)
HGB: 12.7 g/dL (ref 11.6–15.9)
LYMPH%: 40.8 % (ref 14.0–49.7)
MCH: 36 pg — ABNORMAL HIGH (ref 25.1–34.0)
MCHC: 34.9 g/dL (ref 31.5–36.0)
MCV: 103 fL — ABNORMAL HIGH (ref 79.5–101.0)
MONO#: 0.2 10*3/uL (ref 0.1–0.9)
MONO%: 6.6 % (ref 0.0–14.0)
NEUT#: 1.9 10*3/uL (ref 1.5–6.5)
NEUT%: 50.6 % (ref 38.4–76.8)
Platelets: 229 10*3/uL (ref 145–400)
RBC: 3.52 10*6/uL — ABNORMAL LOW (ref 3.70–5.45)
RDW: 13.3 % (ref 11.2–14.5)
WBC: 3.7 10*3/uL — ABNORMAL LOW (ref 3.9–10.3)
lymph#: 1.5 10*3/uL (ref 0.9–3.3)

## 2016-08-12 ENCOUNTER — Encounter: Payer: Self-pay | Admitting: General Practice

## 2016-08-12 LAB — CA 125: Cancer Antigen (CA) 125: 22.8 U/mL (ref 0.0–38.1)

## 2016-08-12 LAB — VITAMIN B12: Vitamin B12: 1566 pg/mL — ABNORMAL HIGH (ref 232–1245)

## 2016-08-12 NOTE — Progress Notes (Signed)
[place] Spiritual Care Note  Kathryn Ramsey by phone to introduce Spiritual Care as part of her support team per referral from Dr Jana Hakim.  During call she was upbeat, verbalizing gratitude for Support Team's outreach and care, and especially for the ways that she has grown through living with cancer.  She is finding inspiration in a recent talk from a cancer survivor who emphasized empathy and increased attention to life/detail.  [name] reports good support from a multilayered church support system (various groups and friendships).  While her appt tomorrow with Dr Jana Hakim is not a convenient time to meet, she knows to contact [place] as any needs/interests arise.   [place], [place], [facility] Pager [phone] Voicemail [phone]

## 2016-08-13 ENCOUNTER — Ambulatory Visit (HOSPITAL_BASED_OUTPATIENT_CLINIC_OR_DEPARTMENT_OTHER): Payer: 59 | Admitting: Oncology

## 2016-08-13 VITALS — BP 122/68 | HR 68 | Temp 97.8°F | Resp 18 | Ht 63.0 in | Wt 120.5 lb

## 2016-08-13 DIAGNOSIS — C762 Malignant neoplasm of abdomen: Secondary | ICD-10-CM

## 2016-08-13 DIAGNOSIS — C786 Secondary malignant neoplasm of retroperitoneum and peritoneum: Secondary | ICD-10-CM

## 2016-08-13 DIAGNOSIS — C5701 Malignant neoplasm of right fallopian tube: Secondary | ICD-10-CM

## 2016-08-13 DIAGNOSIS — C801 Malignant (primary) neoplasm, unspecified: Secondary | ICD-10-CM

## 2016-08-13 NOTE — Progress Notes (Signed)
Kathryn Ramsey  Telephone:([phone]) [phone] Fax:(336) [phone]     ID: Kathryn Ramsey DOB: 13-Nov-1965  MR#: [id number]  MOQ#:[id number]  Patient Care Team: Kathryn Stains, MD as PCP - General (Family Medicine) Kathryn Levan, MD as Consulting Physician (Oncology) Kathryn Amber, MD as Consulting Physician (Obstetrics and Gynecology) Kathryn Craver, MD as Consulting Physician (Gastroenterology) Kathryn Salina, MD as Consulting Physician (Obstetrics and Gynecology) Kathryn Retort, MD as Consulting Physician (Urology) Kathryn Ends, MD as Referring Physician (Obstetrics and Gynecology) Kathryn Cruel, MD OTHER MD:  CHIEF COMPLAINT: Recurrent ovarian cancer  CURRENT TREATMENT: rucaparib  INTERVAL HISTORY: Kathryn Ramsey returns today for follow-up and treatment of her recurrent ovarian cancer accompanied by her husband. Kathryn Ramsey started rucaparib mid May 2018. She initially had some symptoms related to it but she has made her peace with the medication and she is currently tolerating it very well. She finds that Prilosec took care of the reflux issues and it helps her sleep. She does have some hiccups at times. She has good energy, although if she does a lot one day the next day she will feel tired. She has finally regulated her bowel movements, with no stool softeners but a variety of fiber and fruit preparations. 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 She is on a very low dose of gabapentin and is thinking of perhaps doubling up on that which would be fine with me (she is also getting curbside advise from her friend are local neurologist Kathryn. Jannifer Ramsey.  In addition Kathryn Ramsey has become very active in the [place] [facility] group, which aims to provide increased awareness and information regarding ovarian cancer. She is leading the charts to make this a major presence of  this area  REVIEW OF SYSTEMS: A detailed review of systems today was otherwise stable  HISTORY OF PRESENT ILLNESS: From Kathryn. Serita Ramsey original intake note 03/17/2015:  "Kathryn Ramsey is a very pleasant G4P4 who is seen in consultation at the request of Kathryn Ramsey and Kathryn Ramsey for peritoneal carcinomatosis. The patient has a history of a workup by urology for gross hematuria which included a pelvic examine was suggested for extrinsic mass effect on the bladder on cystoscopy. Cystoscopy took place on in December 2016. The patient denies abdominal pain bloating early satiety or abdominal distention.  On 08/03/2015 at [facility] she underwent a CT scan of the abdomen and pelvis as ordered by Kathryn Ramsey. This revealed a 1.4 cm low attenuation lesion on the posterior right hepatic lobe suggestive of a hemangioma. Status post hysterectomy. No ovarian masses. Moderate ascites. Omental caking seen in the lateral left abdomen and pelvis. Peritoneal nodularity in the left lateral pelvic cul-de-sac. No gross extrinsic compression on the bladder was identified on imaging.  The patient was then seen by her gastroenterologist, Kathryn. Collene Ramsey, who performed a colonoscopy which was unremarkable.  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 She was treated adjuvantly with carboplatin and paclitaxel, as detailed below. Her CA 125 dropped from 1226 on 03/20/2015 to 26.1 by 06/16/2015.  Her subsequent history is as detailed  below.  I PAST MEDICAL HISTORY: Past Medical History:  Diagnosis Date  . Acute sensory neuropathy ([place]) 06/26/2015  . Allergy   . Anemia   . Asthma     illness induced asthma  . Blood transfusion without reported diagnosis    at age 35 years old D/T surgery to femur being crushed  . 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 Heart murmur    pt, states had a "working heart murmur"  . History of kidney stones   . PONV (postoperative nausea and vomiting)   . Skin cancer     PAST SURGICAL HISTORY: Past Surgical History:  Procedure Laterality Date  . 3 laporscopic proceedures    . ABDOMINAL HYSTERECTOMY     2010  . CHOLECYSTECTOMY     2010  . DILATION AND CURETTAGE OF UTERUS     1997  . LAPAROSCOPY N/A 03/27/2015   Procedure: DIAGNOSTIC LAPAROSCOPY ;  Surgeon: Kathryn Amber, MD;  Location: [facility];  Service: Gynecology;  Laterality: N/A;  . LAPAROSCOPY N/A 02/24/2016   Procedure: LAPAROSCOPY DIAGNOSTIC WITH PERITONEAL WASHINGS AND PERITONEAL BIOPSY;  Surgeon: Kathryn Amber, MD;  Location: [facility];  Service: Gynecology;  Laterality: N/A;  . LAPAROTOMY N/A 03/27/2015   Procedure: EXPLORATORY LAPAROTOMY, BILATERAL SALPINGO OOPHORECTOMY, OMENTECTOMY, RADICAL TUMOR DEBULKING;  Surgeon: Kathryn Amber, MD;  Location: [facility];  Service: Gynecology;  Laterality: N/A;  . sinus surgery 1994      FAMILY HISTORY Family History  Problem Relation Age of Onset  . Hypertension Father   . Skin cancer Father        nonmelanoma skin cancers in his late 28s  . Non-Hodgkin's lymphoma Maternal Aunt        dx. 19s; smoker  . Stroke Maternal Grandmother   . Other Mother        benign meningioma dx. early-mid-70s; hysterectomy in her late 57s for heavy periods - still has ovaries  . Other Son        one son with pre-cancerous skin findings  . Other Daughter        cysts on ovaries and hx of heavy periods  . Other Sister        hysterectomy for cysts - still has ovaries  . Heart attack Maternal Grandfather   . Heart attack Paternal Grandfather   . Renal cancer Maternal Aunt 60       smoker  .  Breast cancer Maternal Aunt 78  . Other Maternal Aunt        dx. benign brain tumor (meningioma) at 17; 2nd benign brain tumor in her late 25s; hx of radical hysterectomy at age 4  . Brain cancer Other        NOS tumor  The patient's parents are both living, in their late 102s as of Kathryn Ramsey 2018. The patient has one brother, 3 sisters. There is no history of ovarian cancer in the family. One maternal aunt had breast and kidney cancer, another had non-Hodgkin's lymphoma.  GYNECOLOGIC HISTORY:  No LMP recorded. Patient has had a hysterectomy. 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 Her husband [name] is a Engineer, maintenance (IT). Their children are 52, 24, 46 and 22 y/o as of JAN 2018. The oldest works as an Electrical engineer in the [employer] in [place].  The other 3 children live in [place], the youngest currently attending [employer]. Rhe patient has no grandchildren. She attends [employer]    ADVANCED DIRECTIVES:    HEALTH MAINTENANCE: Social History  Substance Use Topics  . Smoking status: Never Smoker  . Smokeless tobacco: Never Used  . Alcohol use Yes     Comment:  3 glasses wine or beer/week     Colonoscopy:2016  PAP: s/p hyst  Bone density:   Allergies  Allergen Reactions  . Bee Venom Shortness Of Breath    swelling  . Sulfa Antibiotics Shortness Of Breath    Vomit , diarrhea , hives    Current Outpatient Prescriptions  Medication Sig Dispense Refill  . docusate sodium (COLACE) 100 MG capsule Take 100 mg by mouth 2 (two) times daily.    [name] gabapentin (NEURONTIN) 100 MG capsule Take 1 capsule (100 mg total) by mouth at bedtime. Takes 111m at bedtime. Takes an additional 1045mduring the day if needed for nerve pain. 90 capsule 4  . Lactobacillus (PROBIOTIC ACIDOPHILUS PO) Take 1 capsule by mouth every evening.    . Marland Kitchenmeprazole (PRILOSEC) 40 MG capsule Take 1 capsule (40 mg  total) by mouth at bedtime. 90 capsule 4  . Rucaparib Camsylate 300 MG TABS Take 600 mg by mouth 2 (two) times daily. 120 tablet 4  . TURMERIC PO Take 1 tablet by mouth daily.    . valACYclovir (VALTREX) 1000 MG tablet Take 1 tablet (1,000 mg total) by mouth 2 (two) times daily. 30 tablet 3   No current facility-administered medications for this visit.     OBJECTIVE: middle aged White Ramsey Who appears well  Vitals:   08/13/16 0934  BP: 122/68  Pulse: 68  Resp: 18  Temp: 97.8 F (36.6 C)     Body mass index is 21.35 kg/m.    ECOG FS:0 - Asymptomatic  Sclerae unicteric, EOMs intact Oropharynx clear and moist No cervical or supraclavicular adenopathy Lungs no rales or rhonchi Heart regular rate and rhythm Abd soft, nontender, positive bowel sounds MSK no focal spinal tenderness, no upper extremity lymphedema Neuro: nonfocal, well oriented, appropriate affect Breasts: I do not palpate any suspicious masses in either breast. Both axillae benign. Skin: The port metastases sites are now essentially not palpable. The one at the lower right corner, which has been the most pronounced 1, is now essentially flat  Photo of right lower quadrant port-site metastases 03/15/2016    Photo 04/02/2016    LAB RESULTS:  CMP     Component Value Date/Time   NA 141 08/11/2016 1204   K 3.7 08/11/2016 1204   CL 104 06/18/2015 1248   CO2 28 08/11/2016 1204   GLUCOSE 94 08/11/2016 1204   BUN 9.4 08/11/2016 1204   CREATININE 0.8 08/11/2016 1204   CALCIUM 10.1 08/11/2016 1204   PROT 7.8 08/11/2016 1204   ALBUMIN 4.6 08/11/2016 1204   AST 22 08/11/2016 1204   ALT 21 08/11/2016 1204   ALKPHOS 75 08/11/2016 1204   BILITOT 0.49 08/11/2016 1204   GFRNONAA >60 06/18/2015 1248   GFRAA >60 06/18/2015 1248    INo results found for: SPEP, UPEP  Lab Results  Component Value Date   WBC 3.7 (L) 08/11/2016   NEUTROABS 1.9 08/11/2016   HGB 12.7 08/11/2016   HCT 36.3 08/11/2016   MCV 103.0 (H)  08/11/2016   PLT 229 08/11/2016      Chemistry      Component Value Date/Time   NA  141 08/11/2016 1204   K 3.7 08/11/2016 1204   CL 104 06/18/2015 1248   CO2 28 08/11/2016 1204   BUN 9.4 08/11/2016 1204   CREATININE 0.8 08/11/2016 1204      Component Value Date/Time   CALCIUM 10.1 08/11/2016 1204   ALKPHOS 75 08/11/2016 1204   AST 22 08/11/2016 1204   ALT 21 08/11/2016 1204   BILITOT 0.49 08/11/2016 1204       No results found for: LABCA2  No components found for: LABCA125  No results for input(s): INR in the last 168 hours.  Urinalysis    Component Value Date/Time   COLORURINE YELLOW 03/29/2015 0920   APPEARANCEUR CLEAR 03/29/2015 0920   LABSPEC 1.005 10/09/2015 1239   PHURINE 6.0 10/09/2015 1239   PHURINE 7.5 03/29/2015 0920   GLUCOSEU Negative 10/09/2015 1239   HGBUR Negative 10/09/2015 1239   HGBUR SMALL (A) 03/29/2015 0920   BILIRUBINUR Negative 10/09/2015 1239   KETONESUR Negative 10/09/2015 1239   KETONESUR NEGATIVE 03/29/2015 0920   PROTEINUR Negative 10/09/2015 1239   PROTEINUR NEGATIVE 03/29/2015 0920   UROBILINOGEN 0.2 10/09/2015 1239   NITRITE Negative 10/09/2015 1239   NITRITE NEGATIVE 03/29/2015 0920   LEUKOCYTESUR Negative 10/09/2015 1239     STUDIES: No results found.  ELIGIBLE FOR AVAILABLE RESEARCH PROTOCOL: no  ASSESSMENT: 51 y.o. Kathryn Ramsey status post radical tumor debulking with optimal cytoreduction (R0) 03/27/2015 for a stage IIIC, high-grade right fallopian tube carcinoma  (a) baseline CA-125 was1226.  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)  (1) adjuvant chemotherapy consisted of carboplatin and paclitaxel for 6 doses, begun 04/14/2015, completed 08/11/2015  (a) paclitaxel was omitted from cycle 5 and dose reduced on cycle 6 because of neuropathy    (b) a "make up" dose of paclitaxel was given 08/25/2015  (c) last carboplatin dose was 08/11/2015  (d) CA-125 normalized by 06/16/2015   (2) FIRST RECURRENCE: Kathryn Ramsey 2018  (a) CA-125 rise beginning November 2017 led to CT scans and PET scans December 2017, all negative  (b) exploratory laparotomy 02/24/2016 showed pathologically confirmed recurrence, with miliary disease involving all examined surfaces  (c) port-site metastases noted 03/08/2016  (3) carboplatin/liposomal doxorubicin started 03/05/2016, repeated every three weeks x4, completed 06/04/2016.  (a) cycle 3 delayed one week because of neutropenia; OnPro added  (4) RUCAPARIB started 07/02/2016  PLAN Kathryn Ramsey is now about 6 weeks into her room Her treatment, with excellent tolerance. She has mild fatigue, but no significant nausea, edema, rash, or other sick systemic symptoms. She continues to be in clinical remission and her CA 125 continues to be in the normal range.  We had discussed repeating a CT scan in another month or 2 but at this point she is comfortable with what she is doing and since her scans have not been that informative in any case but she would like to do is wait until there is some bump in the CA 125 numbers or some clinical evidence of progression  I think this is a very reasonable plan. She will return to see me in August. We will do lab work 2 days before the visit. She knows to call for any other issues that may develop before her next visit here.    :Kathryn Cruel, MD   08/14/2016 12:02 PM Medical Oncology and Hematology [facility] [place], [place]  [place] Tel. [phone]    Fax. [phone]

## 2016-09-13 ENCOUNTER — Encounter: Payer: Self-pay | Admitting: Oncology

## 2016-09-22 ENCOUNTER — Other Ambulatory Visit: Payer: 59

## 2016-09-22 ENCOUNTER — Other Ambulatory Visit: Payer: Self-pay

## 2016-09-23 ENCOUNTER — Telehealth: Payer: Self-pay

## 2016-09-23 ENCOUNTER — Other Ambulatory Visit: Payer: Self-pay

## 2016-09-23 DIAGNOSIS — C801 Malignant (primary) neoplasm, unspecified: Secondary | ICD-10-CM

## 2016-09-23 DIAGNOSIS — C5701 Malignant neoplasm of right fallopian tube: Secondary | ICD-10-CM

## 2016-09-23 DIAGNOSIS — C762 Malignant neoplasm of abdomen: Secondary | ICD-10-CM

## 2016-09-23 DIAGNOSIS — C786 Secondary malignant neoplasm of retroperitoneum and peritoneum: Secondary | ICD-10-CM

## 2016-09-23 NOTE — Telephone Encounter (Signed)
Pt called requesting CT abd pelvis to be done prior to seeing GM on 10/01/16.  Per GM last office not this was discussed with pt.  Order has been placed by this RN and in basket sent to [facility] for PA.  Pt notified

## 2016-09-24 ENCOUNTER — Telehealth: Payer: Self-pay

## 2016-09-24 ENCOUNTER — Ambulatory Visit: Payer: 59 | Admitting: Oncology

## 2016-09-24 NOTE — Telephone Encounter (Signed)
PA received for CT scan.  CS made aware.  Pt notified to anticipate call from CS to schedule appt for scan.

## 2016-09-29 ENCOUNTER — Other Ambulatory Visit (HOSPITAL_BASED_OUTPATIENT_CLINIC_OR_DEPARTMENT_OTHER): Payer: 59

## 2016-09-29 DIAGNOSIS — C786 Secondary malignant neoplasm of retroperitoneum and peritoneum: Secondary | ICD-10-CM

## 2016-09-29 DIAGNOSIS — C801 Malignant (primary) neoplasm, unspecified: Secondary | ICD-10-CM

## 2016-09-29 DIAGNOSIS — C5701 Malignant neoplasm of right fallopian tube: Secondary | ICD-10-CM | POA: Diagnosis not present

## 2016-09-29 DIAGNOSIS — C762 Malignant neoplasm of abdomen: Secondary | ICD-10-CM | POA: Diagnosis not present

## 2016-09-29 LAB — COMPREHENSIVE METABOLIC PANEL
ALT: 19 U/L (ref 0–55)
AST: 22 U/L (ref 5–34)
Albumin: 4.4 g/dL (ref 3.5–5.0)
Alkaline Phosphatase: 79 U/L (ref 40–150)
Anion Gap: 9 mEq/L (ref 3–11)
BUN: 9.3 mg/dL (ref 7.0–26.0)
CO2: 27 mEq/L (ref 22–29)
Calcium: 10.1 mg/dL (ref 8.4–10.4)
Chloride: 104 mEq/L (ref 98–109)
Creatinine: 0.8 mg/dL (ref 0.6–1.1)
EGFR: 82 mL/min/{1.73_m2} — ABNORMAL LOW (ref >90)
Glucose: 73 mg/dl (ref 70–140)
Potassium: 4 mEq/L (ref 3.5–5.1)
Sodium: 140 mEq/L (ref 136–145)
Total Bilirubin: 0.85 mg/dL (ref 0.20–1.20)
Total Protein: 7.6 g/dL (ref 6.4–8.3)

## 2016-09-29 LAB — CBC WITH DIFFERENTIAL/PLATELET
BASO%: 0.9 % (ref 0.0–2.0)
Basophils Absolute: 0 10*3/uL (ref 0.0–0.1)
EOS%: 0.9 % (ref 0.0–7.0)
Eosinophils Absolute: 0 10*3/uL (ref 0.0–0.5)
HCT: 36.6 % (ref 34.8–46.6)
HGB: 12.6 g/dL (ref 11.6–15.9)
LYMPH%: 49.2 % (ref 14.0–49.7)
MCH: 35.3 pg — ABNORMAL HIGH (ref 25.1–34.0)
MCHC: 34.5 g/dL (ref 31.5–36.0)
MCV: 102.4 fL — ABNORMAL HIGH (ref 79.5–101.0)
MONO#: 0.3 10*3/uL (ref 0.1–0.9)
MONO%: 9.1 % (ref 0.0–14.0)
NEUT#: 1.1 10*3/uL — ABNORMAL LOW (ref 1.5–6.5)
NEUT%: 39.9 % (ref 38.4–76.8)
Platelets: 240 10*3/uL (ref 145–400)
RBC: 3.58 10*6/uL — ABNORMAL LOW (ref 3.70–5.45)
RDW: 13.4 % (ref 11.2–14.5)
WBC: 2.8 10*3/uL — ABNORMAL LOW (ref 3.9–10.3)
lymph#: 1.4 10*3/uL (ref 0.9–3.3)

## 2016-09-30 ENCOUNTER — Ambulatory Visit (HOSPITAL_COMMUNITY)
Admission: RE | Admit: 2016-09-30 | Discharge: 2016-09-30 | Disposition: A | Discharge status: Home / Self Care | Payer: 59 | Source: Ambulatory Visit | Attending: Oncology | Admitting: Oncology

## 2016-09-30 ENCOUNTER — Other Ambulatory Visit: Payer: Self-pay | Admitting: Oncology

## 2016-09-30 ENCOUNTER — Other Ambulatory Visit: Payer: Self-pay | Admitting: *Deleted

## 2016-09-30 ENCOUNTER — Encounter (HOSPITAL_COMMUNITY): Payer: Self-pay

## 2016-09-30 DIAGNOSIS — C801 Malignant (primary) neoplasm, unspecified: Secondary | ICD-10-CM

## 2016-09-30 DIAGNOSIS — C5701 Malignant neoplasm of right fallopian tube: Secondary | ICD-10-CM

## 2016-09-30 DIAGNOSIS — C762 Malignant neoplasm of abdomen: Secondary | ICD-10-CM

## 2016-09-30 DIAGNOSIS — C786 Secondary malignant neoplasm of retroperitoneum and peritoneum: Secondary | ICD-10-CM

## 2016-09-30 DIAGNOSIS — Z8589 Personal history of malignant neoplasm of other organs and systems: Secondary | ICD-10-CM | POA: Diagnosis not present

## 2016-09-30 DIAGNOSIS — Z85028 Personal history of other malignant neoplasm of stomach: Secondary | ICD-10-CM | POA: Insufficient documentation

## 2016-09-30 DIAGNOSIS — Z8544 Personal history of malignant neoplasm of other female genital organs: Secondary | ICD-10-CM | POA: Diagnosis not present

## 2016-09-30 LAB — CA 125: Cancer Antigen (CA) 125: 22.6 U/mL (ref 0.0–38.1)

## 2016-09-30 MED ORDER — IOPAMIDOL (ISOVUE-300) INJECTION 61%
INTRAVENOUS | Status: AC
  Filled 2016-09-30: qty 100

## 2016-09-30 MED ORDER — IOPAMIDOL (ISOVUE-300) INJECTION 61%
100.0000 mL | Freq: Once | INTRAVENOUS | Status: AC | PRN
  Administered 2016-09-30: 100 mL via INTRAVENOUS

## 2016-10-01 ENCOUNTER — Ambulatory Visit (HOSPITAL_BASED_OUTPATIENT_CLINIC_OR_DEPARTMENT_OTHER): Payer: 59 | Admitting: Oncology

## 2016-10-01 VITALS — BP 126/55 | HR 71 | Temp 97.9°F | Resp 18 | Ht 63.0 in | Wt 124.1 lb

## 2016-10-01 DIAGNOSIS — C762 Malignant neoplasm of abdomen: Secondary | ICD-10-CM

## 2016-10-01 DIAGNOSIS — C5701 Malignant neoplasm of right fallopian tube: Secondary | ICD-10-CM | POA: Diagnosis not present

## 2016-10-01 NOTE — Progress Notes (Signed)
[facility]  [phone] [phone]     ID: Kathryn Ramsey DOB: 05/10/1965  MR#: [id number]  PYK#:[id number]  Patient Care Team: Harlan Stains, MD as PCP - General (Family Medicine) Gordy Levan, MD as Consulting Physician (Oncology) [name], MD as Consulting Physician (Obstetrics and Gynecology) Juanita Craver, MD as Consulting Physician (Gastroenterology) Servando Salina, MD as Consulting Physician (Obstetrics and Gynecology) Nickie Retort, MD as Consulting Physician (Urology) Gillis Ends, MD as Referring Physician (Obstetrics and Gynecology) Chauncey Cruel, MD OTHER MD:  CHIEF COMPLAINT: Recurrent ovarian cancer  CURRENT TREATMENT: rucaparib  INTERVAL HISTORY: Kathryn Ramsey returns today for follow-up and treatment of her recurrent ovarian cancer. She continues under the Rib, full dose. She is doing remarkably well with this medication. She has found that if she takes it at exactly 12 hour intervals she has fewer side effects.  She did have problems with reflux, but Prilosec took care of that. She had some constipation and she takes probiotics and drinks a lot of water, which makes that manageable. She had metallic taste but that is much improved. Finally she is having some bloating. This is pretty much all the time. It is not a visible thinking its more a feeling. This is very likely related to the medication  She has become very active in the ovarian cancer support group called "she brought" and is bringing it to [place].  REVIEW OF SYSTEMS: In addition to the above side effects, which probably are related to the treatment, she has others are not. She has a dull headache, comes and goes over the last couple of weeks. She has what she calls nerve pain across her mid back. This is partly positional and partly intermittent. She has achiness in the right lower quadrant again very intermittent and she has what she calls peripheral  neuropathy in her feet and shins, again very intermittent.--She is exercising regularly by doing yoga, speed walks, and gym twice a week.  HISTORY OF PRESENT ILLNESS: From Dr. Serita Grit original intake note 03/17/2015:  "Kathryn Ramsey is a very pleasant G4P4 who is seen in consultation at the request of Dr Collene Mares and Dr Garwin Brothers for peritoneal carcinomatosis. The patient has a history of a workup by urology for gross hematuria which included a pelvic examine was suggested for extrinsic mass effect on the bladder on cystoscopy. Cystoscopy took place on in December 2016. The patient denies abdominal pain bloating early satiety or abdominal distention.  On 08/03/2015 at [facility] she underwent a CT scan of the abdomen and pelvis as ordered by Dr Baruch Gouty. This revealed a 1.4 cm low attenuation lesion on the posterior right hepatic lobe suggestive of a hemangioma. Status post hysterectomy. No ovarian masses. Moderate ascites. Omental caking seen in the lateral left abdomen and pelvis. Peritoneal nodularity in the left lateral pelvic cul-de-sac. No gross extrinsic compression on the bladder was identified on imaging.  The patient was then seen by her gastroenterologist, Dr. Collene Mares, who performed a colonoscopy which was unremarkable.  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 She was treated adjuvantly with carboplatin and paclitaxel, as detailed below. Her CA 125 dropped from 1226 on 03/20/2015 to 26.1 by 06/16/2015.  Her subsequent history is as detailed below.  I PAST MEDICAL HISTORY: Past Medical History:  Diagnosis Date  . Acute sensory neuropathy ([place])  06/26/2015  . Allergy   . Anemia   . Asthma    illness induced asthma  . Blood transfusion without reported diagnosis    at age 38 years old D/T surgery to femur being crushed  . 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 Heart murmur    pt, states had a "working heart murmur"  . History of kidney stones   . PONV (postoperative nausea and vomiting)   . Skin cancer     PAST SURGICAL HISTORY: Past Surgical History:  Procedure Laterality Date  . 3 laporscopic proceedures    . ABDOMINAL HYSTERECTOMY     2010  . CHOLECYSTECTOMY     2010  . DILATION AND CURETTAGE OF UTERUS     1997  . LAPAROSCOPY N/A 03/27/2015   Procedure: DIAGNOSTIC LAPAROSCOPY ;  Surgeon: [name], MD;  Location: [facility];  Service: Gynecology;  Laterality: N/A;  . LAPAROSCOPY N/A 02/24/2016   Procedure: LAPAROSCOPY DIAGNOSTIC WITH PERITONEAL WASHINGS AND PERITONEAL BIOPSY;  Surgeon: [name], MD;  Location: [facility];  Service: Gynecology;  Laterality: N/A;  . LAPAROTOMY N/A 03/27/2015   Procedure: EXPLORATORY LAPAROTOMY, BILATERAL SALPINGO OOPHORECTOMY, OMENTECTOMY, RADICAL TUMOR DEBULKING;  Surgeon: [name], MD;  Location: [facility];  Service: Gynecology;  Laterality: N/A;  . sinus surgery 1994      FAMILY HISTORY Family History  Problem Relation Age of Onset  . Hypertension Father   . Skin cancer Father        nonmelanoma skin cancers in his late 81s  . Non-Hodgkin's lymphoma Maternal Aunt        dx. 8s; smoker  . Stroke Maternal Grandmother   . Other Mother        benign meningioma dx. early-mid-70s; hysterectomy in her late 26s for heavy periods - still has ovaries  . Other Son        one son with pre-cancerous skin findings  . Other Daughter        cysts on ovaries and hx of heavy periods  . Other Sister        hysterectomy for cysts - still has ovaries  . Heart attack Maternal Grandfather   . Heart attack Paternal Grandfather     . Renal cancer Maternal Aunt 60       smoker  . Breast cancer Maternal Aunt 78  . Other Maternal Aunt        dx. benign brain tumor (meningioma) at 50; 2nd benign brain tumor in her late 25s; hx of radical hysterectomy at age 63  . Brain cancer Other        NOS tumor  The patient's parents are both living, in their late 38s as of January 2018. The patient has one brother, 3 sisters. There is no history of ovarian cancer in the family. One maternal aunt had breast and kidney cancer, another had non-Hodgkin's lymphoma.  GYNECOLOGIC HISTORY:  No LMP recorded. Patient has had a hysterectomy. 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 Her husband [name] is a Engineer, maintenance (IT). Their children are 80, 62, 61 and 45 y/o as of JAN 2018. The oldest works as an Electrical engineer  in the [employer] in [place]. The other 3 children live in [place], the youngest currently attending [employer]. Rhe patient has no grandchildren. She attends [employer]    ADVANCED DIRECTIVES:    HEALTH MAINTENANCE: Social History  Substance Use Topics  . Smoking status: Never Smoker  . Smokeless tobacco: Never Used  . Alcohol use Yes     Comment:  3 glasses wine or beer/week     Colonoscopy:2016  PAP: s/p hyst  Bone density:   Allergies  Allergen Reactions  . Bee Venom Shortness Of Breath    swelling  . Sulfa Antibiotics Shortness Of Breath    Vomit , diarrhea , hives    Current Outpatient Prescriptions  Medication Sig Dispense Refill  . docusate sodium (COLACE) 100 MG capsule Take 100 mg by mouth 2 (two) times daily.    [name] gabapentin (NEURONTIN) 100 MG capsule Take 1 capsule (100 mg total) by mouth at bedtime. Takes 147m at bedtime. Takes an additional 1078mduring the day if needed for nerve pain. 90 capsule 4  . Lactobacillus (PROBIOTIC ACIDOPHILUS PO) Take 1 capsule by mouth every evening.    . Marland Kitchenmeprazole  (PRILOSEC) 40 MG capsule Take 1 capsule (40 mg total) by mouth at bedtime. 90 capsule 4  . Rucaparib Camsylate 300 MG TABS Take 600 mg by mouth 2 (two) times daily. 120 tablet 4  . TURMERIC PO Take 1 tablet by mouth daily.    . valACYclovir (VALTREX) 1000 MG tablet Take 1 tablet (1,000 mg total) by mouth 2 (two) times daily. 30 tablet 3   No current facility-administered medications for this visit.     OBJECTIVE: middle aged White woman In no acute distress  Vitals:   10/01/16 0923  BP: (!) 126/55  Pulse: 71  Resp: 18  Temp: 97.9 F (36.6 C)  SpO2: 100%     Body mass index is 21.98 kg/m.    ECOG FS:0 - Asymptomatic  Sclerae unicteric, pupils round and equal Oropharynx clear and moist No cervical or supraclavicular adenopathy Lungs no rales or rhonchi Heart regular rate and rhythm Abd soft, nontender, positive bowel sounds MSK no focal spinal tenderness, no upper extremity lymphedema Neuro: nonfocal, well oriented, appropriate affect Breasts: Deferred Skin: There is no palpable finding at any of the port metastases sites.  Photo of right lower quadrant port-site metastases 03/15/2016    Photo 04/02/2016    LAB RESULTS:  CMP     Component Value Date/Time   NA 140 09/29/2016 0930   K 4.0 09/29/2016 0930   CL 104 06/18/2015 1248   CO2 27 09/29/2016 0930   GLUCOSE 73 09/29/2016 0930   BUN 9.3 09/29/2016 0930   CREATININE 0.8 09/29/2016 0930   CALCIUM 10.1 09/29/2016 0930   PROT 7.6 09/29/2016 0930   ALBUMIN 4.4 09/29/2016 0930   AST 22 09/29/2016 0930   ALT 19 09/29/2016 0930   ALKPHOS 79 09/29/2016 0930   BILITOT 0.85 09/29/2016 0930   GFRNONAA >60 06/18/2015 1248   GFRAA >60 06/18/2015 1248    INo results found for: SPEP, UPEP  Lab Results  Component Value Date   WBC 2.8 (L) 09/29/2016   NEUTROABS 1.1 (L) 09/29/2016   HGB 12.6 09/29/2016   HCT 36.6 09/29/2016   MCV 102.4 (H) 09/29/2016   PLT 240 09/29/2016      Chemistry      Component Value  Date/Time   NA 140 09/29/2016 0930   K 4.0 09/29/2016 0930   CL  104 06/18/2015 1248   CO2 27 09/29/2016 0930   BUN 9.3 09/29/2016 0930   CREATININE 0.8 09/29/2016 0930      Component Value Date/Time   CALCIUM 10.1 09/29/2016 0930   ALKPHOS 79 09/29/2016 0930   AST 22 09/29/2016 0930   ALT 19 09/29/2016 0930   BILITOT 0.85 09/29/2016 0930       No results found for: LABCA2  No components found for: LABCA125  No results for input(s): INR in the last 168 hours.  Urinalysis    Component Value Date/Time   COLORURINE YELLOW 03/29/2015 0920   APPEARANCEUR CLEAR 03/29/2015 0920   LABSPEC 1.005 10/09/2015 1239   PHURINE 6.0 10/09/2015 1239   PHURINE 7.5 03/29/2015 0920   GLUCOSEU Negative 10/09/2015 1239   HGBUR Negative 10/09/2015 1239   HGBUR SMALL (A) 03/29/2015 0920   BILIRUBINUR Negative 10/09/2015 1239   KETONESUR Negative 10/09/2015 1239   KETONESUR NEGATIVE 03/29/2015 0920   PROTEINUR Negative 10/09/2015 1239   PROTEINUR NEGATIVE 03/29/2015 0920   UROBILINOGEN 0.2 10/09/2015 1239   NITRITE Negative 10/09/2015 1239   NITRITE NEGATIVE 03/29/2015 0920   LEUKOCYTESUR Negative 10/09/2015 1239     STUDIES: Ct Abdomen Pelvis W Contrast  Result Date: 09/30/2016 CLINICAL DATA:  History of ovarian/fallopian tube carcinoma with initial diagnosis January 2017. EXAM: CT ABDOMEN AND PELVIS WITH CONTRAST TECHNIQUE: Multidetector CT imaging of the abdomen and pelvis was performed using the standard protocol following bolus administration of intravenous contrast. CONTRAST:  146m ISOVUE-300 IOPAMIDOL (ISOVUE-300) INJECTION 61% COMPARISON:  CT scan 06/17/2016 FINDINGS: Lower chest: The lung bases are clear of acute process. No pleural effusion or pulmonary lesions. The heart is normal in size. No pericardial effusion. The distal esophagus and aorta are unremarkable. Hepatobiliary: No focal hepatic lesions or intrahepatic biliary dilatation. The gallbladder is surgically absent. No  common bile duct dilatation. Pancreas: No mass, inflammation or ductal dilatation. Spleen: Normal size.  No focal lesions. Adrenals/Urinary Tract: The adrenal glands and kidneys are unremarkable and stable. No renal, ureteral or bladder calculi are mass. Stomach/Bowel: The stomach, duodenum, small bowel and colon are unremarkable. No acute inflammatory process, mass lesions or obstructive findings. Moderate stool throughout the colon may suggest constipation. Vascular/Lymphatic: The aorta is normal in caliber. No dissection. The branch vessels are patent. The major venous structures are patent. No mesenteric or retroperitoneal mass or adenopathy. Small scattered lymph nodes are noted. Reproductive: Surgically absent. Other: No pelvic mass or adenopathy. No free pelvic fluid collections. No inguinal mass or adenopathy. No abdominal wall hernia or subcutaneous lesions. No omental lesions or evidence of peritoneal surface disease. Musculoskeletal: No significant bony findings. IMPRESSION: 1. No acute abdominal/pelvic findings, mass lesions or adenopathy. 2. No findings to suggest recurrent abdominal/pelvic tumor. Electronically Signed   By: PMarijo SanesM.D.   On: 09/30/2016 14:43    ELIGIBLE FOR AVAILABLE RESEARCH PROTOCOL: no  ASSESSMENT: 51y.o. [place] woman status post radical tumor debulking with optimal cytoreduction (R0) 03/27/2015 for a stage IIIC, high-grade right fallopian tube carcinoma  (a) baseline CA-125 was1226.  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)  (1) adjuvant chemotherapy consisted of carboplatin and paclitaxel for 6 doses, begun 04/14/2015, completed 08/11/2015  (a) paclitaxel was omitted from cycle 5 and dose reduced on cycle 6 because of neuropathy   (b) a "make up" dose of paclitaxel was given  08/25/2015  (  c) last carboplatin dose was 08/11/2015  (d) CA-125 normalized by 06/16/2015   (2) FIRST RECURRENCE: January 2018  (a) CA-125 rise beginning November 2017 led to CT scans and PET scans December 2017, all negative  (b) exploratory laparotomy 02/24/2016 showed pathologically confirmed recurrence, with miliary disease involving all examined surfaces  (c) port-site metastases noted 03/08/2016  (3) carboplatin/liposomal doxorubicin started 03/05/2016, repeated every three weeks x4, completed 06/04/2016.  (a) cycle 3 delayed one week because of neutropenia; OnPro added  (4) RUCAPARIB started 07/02/2016  (a) restaging 10/01/2016: Normal CA 125, negative CT of the abdomen and pelvis  PLAN Cele is now 4 months from her last chemotherapy treatment with no evidence of disease recurrence or progression. This is encouraging. We reviewed her labs in detail as well as her CT scans.  She is tolerating the rucaparib well. She has mild cytopenias, but has not required any dose reduction or delay.  We discussed her symptoms in detail. She has pretty much dealt with the reflux 7 constipation problem and her taste change is improved. The bloating is very likely due to the medication and I don't have a simple solution for that. She can try simethicone and of course she would avoid sweetener such as sorbitol which might make the problem worse.  They "nerve problem" she feels across her back is actually right along where she has her bra. I wonder if she did not wear her bra for a couple of days whether that problem would go away. It really might be a hypersensitivity due to pressure. She will give that a try.  I reassured her that her daughter headaches are not due to her cancer. She can try Claritin or similar medications for likely mild sinus issues.  Her right lower quadrant discomfort, which is mild and intermittent, may be related to her prior surgery. We strongly do not see anything on the  scans at to suggest there is disease recurrence there.  I firmed her continuing to do yoga and speak walking and going to the gym in the morning as she is doing. Overall I am delighted at how well she is tolerating the rucaparib and very proud of her work at bringing an ovarian cancer support program to [facility].  She is going to have lab work every 4 weeks, on Wednesdays, and she will see me again in November, but if there are any problems prior to that visit she will let us know.  Chauncey Cruel, MD   10/01/2016 9:55 AM Medical Oncology and Hematology [facility] [place], [place] [address] Tel. [phone]    Fax. [phone]

## 2016-10-03 ENCOUNTER — Other Ambulatory Visit: Payer: Self-pay | Admitting: Oncology

## 2016-10-07 NOTE — Addendum Note (Signed)
Addendum  created 10/07/16 1102 by Roberts Gaudy, MD   Sign clinical note

## 2016-10-27 ENCOUNTER — Other Ambulatory Visit: Payer: 59

## 2016-10-28 ENCOUNTER — Telehealth: Payer: Self-pay | Admitting: *Deleted

## 2016-10-28 NOTE — Telephone Encounter (Signed)
Patient called and request a pap smear test. Appt mde for October 11th, patient aware

## 2016-11-01 ENCOUNTER — Other Ambulatory Visit (HOSPITAL_BASED_OUTPATIENT_CLINIC_OR_DEPARTMENT_OTHER): Payer: 59

## 2016-11-01 DIAGNOSIS — C786 Secondary malignant neoplasm of retroperitoneum and peritoneum: Secondary | ICD-10-CM

## 2016-11-01 DIAGNOSIS — C762 Malignant neoplasm of abdomen: Secondary | ICD-10-CM

## 2016-11-01 DIAGNOSIS — C5701 Malignant neoplasm of right fallopian tube: Secondary | ICD-10-CM | POA: Diagnosis not present

## 2016-11-01 DIAGNOSIS — C801 Malignant (primary) neoplasm, unspecified: Secondary | ICD-10-CM

## 2016-11-01 LAB — CBC WITH DIFFERENTIAL/PLATELET
BASO%: 0.8 % (ref 0.0–2.0)
Basophils Absolute: 0 10*3/uL (ref 0.0–0.1)
EOS%: 0.6 % (ref 0.0–7.0)
Eosinophils Absolute: 0 10*3/uL (ref 0.0–0.5)
HCT: 34.4 % — ABNORMAL LOW (ref 34.8–46.6)
HGB: 12 g/dL (ref 11.6–15.9)
LYMPH%: 51.7 % — ABNORMAL HIGH (ref 14.0–49.7)
MCH: 35.7 pg — ABNORMAL HIGH (ref 25.1–34.0)
MCHC: 34.9 g/dL (ref 31.5–36.0)
MCV: 102.2 fL — ABNORMAL HIGH (ref 79.5–101.0)
MONO#: 0.2 10*3/uL (ref 0.1–0.9)
MONO%: 7.2 % (ref 0.0–14.0)
NEUT#: 1.1 10*3/uL — ABNORMAL LOW (ref 1.5–6.5)
NEUT%: 39.7 % (ref 38.4–76.8)
Platelets: 231 10*3/uL (ref 145–400)
RBC: 3.37 10*6/uL — ABNORMAL LOW (ref 3.70–5.45)
RDW: 14.1 % (ref 11.2–14.5)
WBC: 2.8 10*3/uL — ABNORMAL LOW (ref 3.9–10.3)
lymph#: 1.5 10*3/uL (ref 0.9–3.3)

## 2016-11-01 LAB — COMPREHENSIVE METABOLIC PANEL
ALT: 67 U/L — ABNORMAL HIGH (ref 0–55)
AST: 40 U/L — ABNORMAL HIGH (ref 5–34)
Albumin: 4.6 g/dL (ref 3.5–5.0)
Alkaline Phosphatase: 81 U/L (ref 40–150)
Anion Gap: 11 mEq/L (ref 3–11)
BUN: 14.8 mg/dL (ref 7.0–26.0)
CO2: 25 mEq/L (ref 22–29)
Calcium: 10 mg/dL (ref 8.4–10.4)
Chloride: 102 mEq/L (ref 98–109)
Creatinine: 0.8 mg/dL (ref 0.6–1.1)
EGFR: 79 mL/min/{1.73_m2} — ABNORMAL LOW (ref >90)
Glucose: 94 mg/dl (ref 70–140)
Potassium: 3.8 mEq/L (ref 3.5–5.1)
Sodium: 137 mEq/L (ref 136–145)
Total Bilirubin: 0.94 mg/dL (ref 0.20–1.20)
Total Protein: 7.8 g/dL (ref 6.4–8.3)

## 2016-11-02 LAB — CA 125: Cancer Antigen (CA) 125: 29.5 U/mL (ref 0.0–38.1)

## 2016-11-03 ENCOUNTER — Telehealth: Payer: Self-pay

## 2016-11-03 NOTE — Telephone Encounter (Signed)
Pt called for results of CA 125. Given to her. She is asking if Dr Jana Hakim will want to recheck earlier or keep next lab draw at 10/10 which is 4 weeks.  This RN also noted ast and alt are elevated.

## 2016-11-04 ENCOUNTER — Telehealth: Payer: Self-pay

## 2016-11-04 NOTE — Telephone Encounter (Signed)
Pt called with urinary frequency, and an achy low back, no burning, no blood in urine, no fever.  She is using 100% cranberry juice, water, ginger in her smoothie. Discussed at least 5 bottles water (64 oz) today. She will call if worsens later today. She is going out of town Midwife. Instructed her to take her chemo card and if she worsens at all, increased symptoms or fever she will need to go to urgent care.  She was keeping Dr Jana Hakim informed.

## 2016-11-05 ENCOUNTER — Other Ambulatory Visit: Payer: Self-pay | Admitting: *Deleted

## 2016-11-05 MED ORDER — NITROFURANTOIN MACROCRYSTAL 100 MG PO CAPS: 100.0000 mg | ORAL_CAPSULE | Freq: Two times a day (BID) | ORAL | 0 refills | Status: DC

## 2016-11-11 ENCOUNTER — Telehealth: Payer: Self-pay | Admitting: Pharmacy Technician

## 2016-11-11 NOTE — Telephone Encounter (Signed)
Oral Oncology Patient Advocate Encounter  Received notification from [employer] that prior authorization for Rubraca is due for renewal.  PA submitted on CoverMyMeds Key [id number] Status is pending  Oral Oncology Clinic will continue to follow.  Fabio Asa. Melynda Keller, [place] Patient [place] [phone] 11/11/2016 8:57 AM

## 2016-11-15 NOTE — Telephone Encounter (Signed)
Oral Oncology Patient Advocate Encounter  The prior authorization renewal for Rubraca has been approved.    PA# [id number] Effective dates: 11/15/2016 through 11/11/2017  Fabio Asa. Melynda Keller, [place] Patient [place] [phone] 11/15/2016 3:06 PM

## 2016-11-24 ENCOUNTER — Other Ambulatory Visit (HOSPITAL_BASED_OUTPATIENT_CLINIC_OR_DEPARTMENT_OTHER): Payer: 59

## 2016-11-24 DIAGNOSIS — C762 Malignant neoplasm of abdomen: Secondary | ICD-10-CM

## 2016-11-24 DIAGNOSIS — C786 Secondary malignant neoplasm of retroperitoneum and peritoneum: Secondary | ICD-10-CM | POA: Diagnosis not present

## 2016-11-24 DIAGNOSIS — C5701 Malignant neoplasm of right fallopian tube: Secondary | ICD-10-CM | POA: Diagnosis not present

## 2016-11-24 DIAGNOSIS — C801 Malignant (primary) neoplasm, unspecified: Secondary | ICD-10-CM

## 2016-11-24 LAB — CBC WITH DIFFERENTIAL/PLATELET
BASO%: 0.9 % (ref 0.0–2.0)
Basophils Absolute: 0 10*3/uL (ref 0.0–0.1)
EOS%: 0.6 % (ref 0.0–7.0)
Eosinophils Absolute: 0 10*3/uL (ref 0.0–0.5)
HCT: 32 % — ABNORMAL LOW (ref 34.8–46.6)
HGB: 11 g/dL — ABNORMAL LOW (ref 11.6–15.9)
LYMPH%: 61.4 % — ABNORMAL HIGH (ref 14.0–49.7)
MCH: 35.7 pg — ABNORMAL HIGH (ref 25.1–34.0)
MCHC: 34.5 g/dL (ref 31.5–36.0)
MCV: 103.6 fL — ABNORMAL HIGH (ref 79.5–101.0)
MONO#: 0.2 10*3/uL (ref 0.1–0.9)
MONO%: 8.6 % (ref 0.0–14.0)
NEUT#: 0.7 10*3/uL — ABNORMAL LOW (ref 1.5–6.5)
NEUT%: 28.5 % — ABNORMAL LOW (ref 38.4–76.8)
Platelets: 243 10*3/uL (ref 145–400)
RBC: 3.09 10*6/uL — ABNORMAL LOW (ref 3.70–5.45)
RDW: 14.6 % — ABNORMAL HIGH (ref 11.2–14.5)
WBC: 2.4 10*3/uL — ABNORMAL LOW (ref 3.9–10.3)
lymph#: 1.5 10*3/uL (ref 0.9–3.3)

## 2016-11-25 ENCOUNTER — Ambulatory Visit: Payer: 59 | Attending: Gynecologic Oncology | Admitting: Gynecologic Oncology

## 2016-11-25 ENCOUNTER — Encounter: Payer: Self-pay | Admitting: Gynecologic Oncology

## 2016-11-25 ENCOUNTER — Other Ambulatory Visit: Payer: Self-pay | Admitting: Oncology

## 2016-11-25 VITALS — BP 135/74 | HR 65 | Temp 98.5°F | Resp 18 | Wt 120.0 lb

## 2016-11-25 DIAGNOSIS — Z79899 Other long term (current) drug therapy: Secondary | ICD-10-CM | POA: Diagnosis not present

## 2016-11-25 DIAGNOSIS — C5701 Malignant neoplasm of right fallopian tube: Secondary | ICD-10-CM | POA: Diagnosis not present

## 2016-11-25 DIAGNOSIS — R971 Elevated cancer antigen 125 [CA 125]: Secondary | ICD-10-CM | POA: Diagnosis not present

## 2016-11-25 DIAGNOSIS — Z9221 Personal history of antineoplastic chemotherapy: Secondary | ICD-10-CM | POA: Diagnosis not present

## 2016-11-25 DIAGNOSIS — C569 Malignant neoplasm of unspecified ovary: Secondary | ICD-10-CM

## 2016-11-25 LAB — CA 125: Cancer Antigen (CA) 125: 43.4 U/mL — ABNORMAL HIGH (ref 0.0–38.1)

## 2016-11-25 NOTE — Patient Instructions (Signed)
Plan on having a CT scan of the abdomen and pelvis.  We will call you with the results.  Please call our office for any questions or concerns.
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
## 2016-11-26 ENCOUNTER — Encounter: Payer: Self-pay | Admitting: Oncology

## 2016-11-29 ENCOUNTER — Encounter (HOSPITAL_COMMUNITY): Payer: Self-pay

## 2016-11-29 ENCOUNTER — Ambulatory Visit (HOSPITAL_COMMUNITY)
Admission: RE | Admit: 2016-11-29 | Discharge: 2016-11-29 | Disposition: A | Discharge status: Home / Self Care | Payer: 59 | Source: Ambulatory Visit | Attending: Gynecologic Oncology | Admitting: Gynecologic Oncology

## 2016-11-29 DIAGNOSIS — R1900 Intra-abdominal and pelvic swelling, mass and lump, unspecified site: Secondary | ICD-10-CM | POA: Diagnosis not present

## 2016-11-29 DIAGNOSIS — C569 Malignant neoplasm of unspecified ovary: Secondary | ICD-10-CM | POA: Diagnosis not present

## 2016-11-29 MED ORDER — IOPAMIDOL (ISOVUE-300) INJECTION 61%
100.0000 mL | Freq: Once | INTRAVENOUS | Status: AC | PRN
  Administered 2016-11-29: 100 mL via INTRAVENOUS

## 2016-11-29 MED ORDER — IOPAMIDOL (ISOVUE-300) INJECTION 61%
INTRAVENOUS | Status: AC
  Filled 2016-11-29: qty 100

## 2016-11-30 ENCOUNTER — Encounter: Payer: Self-pay | Admitting: Gynecologic Oncology

## 2016-11-30 ENCOUNTER — Telehealth: Payer: Self-pay | Admitting: Gynecologic Oncology

## 2016-11-30 ENCOUNTER — Encounter: Payer: Self-pay | Admitting: Oncology

## 2016-11-30 NOTE — Telephone Encounter (Signed)
Informed about recurrence, patient is interested in exploring if she can try another PARP inhibitor.  I will discuss this with the hospital pharmacist.  We will test her tumor for PDL1, and ER/PR.

## 2016-12-01 ENCOUNTER — Encounter: Payer: Self-pay | Admitting: Gynecologic Oncology

## 2016-12-01 NOTE — Progress Notes (Signed)
PDL-1 and ER/PR ordered on bx from Jan 2018 with [facility] in [place].

## 2016-12-03 ENCOUNTER — Other Ambulatory Visit (HOSPITAL_COMMUNITY)
Admission: RE | Admit: 2016-12-03 | Discharge: 2016-12-03 | Disposition: A | Discharge status: Home / Self Care | Payer: 59 | Source: Ambulatory Visit | Attending: Gynecologic Oncology | Admitting: Gynecologic Oncology

## 2016-12-03 DIAGNOSIS — C569 Malignant neoplasm of unspecified ovary: Secondary | ICD-10-CM | POA: Diagnosis present

## 2016-12-08 ENCOUNTER — Other Ambulatory Visit (HOSPITAL_BASED_OUTPATIENT_CLINIC_OR_DEPARTMENT_OTHER): Payer: 59

## 2016-12-08 ENCOUNTER — Other Ambulatory Visit: Payer: Self-pay | Admitting: Oncology

## 2016-12-08 DIAGNOSIS — C786 Secondary malignant neoplasm of retroperitoneum and peritoneum: Secondary | ICD-10-CM

## 2016-12-08 DIAGNOSIS — C762 Malignant neoplasm of abdomen: Secondary | ICD-10-CM

## 2016-12-08 DIAGNOSIS — C801 Malignant (primary) neoplasm, unspecified: Secondary | ICD-10-CM

## 2016-12-08 DIAGNOSIS — C5701 Malignant neoplasm of right fallopian tube: Secondary | ICD-10-CM | POA: Diagnosis not present

## 2016-12-08 DIAGNOSIS — C569 Malignant neoplasm of unspecified ovary: Secondary | ICD-10-CM

## 2016-12-08 LAB — COMPREHENSIVE METABOLIC PANEL
ALT: 46 U/L (ref 0–55)
AST: 37 U/L — ABNORMAL HIGH (ref 5–34)
Albumin: 4.7 g/dL (ref 3.5–5.0)
Alkaline Phosphatase: 71 U/L (ref 40–150)
Anion Gap: 10 mEq/L (ref 3–11)
BUN: 9.8 mg/dL (ref 7.0–26.0)
CO2: 25 mEq/L (ref 22–29)
Calcium: 9.6 mg/dL (ref 8.4–10.4)
Chloride: 101 mEq/L (ref 98–109)
Creatinine: 0.8 mg/dL (ref 0.6–1.1)
EGFR: >60 mL/min/{1.73_m2} (ref >60)
Glucose: 90 mg/dl (ref 70–140)
Potassium: 4.1 mEq/L (ref 3.5–5.1)
Sodium: 136 mEq/L (ref 136–145)
Total Bilirubin: 0.89 mg/dL (ref 0.20–1.20)
Total Protein: 7.8 g/dL (ref 6.4–8.3)

## 2016-12-08 LAB — CBC WITH DIFFERENTIAL/PLATELET
BASO%: 0.8 % (ref 0.0–2.0)
Basophils Absolute: 0 10*3/uL (ref 0.0–0.1)
EOS%: 0.7 % (ref 0.0–7.0)
Eosinophils Absolute: 0 10*3/uL (ref 0.0–0.5)
HCT: 29.6 % — ABNORMAL LOW (ref 34.8–46.6)
HGB: 10.3 g/dL — ABNORMAL LOW (ref 11.6–15.9)
LYMPH%: 54.5 % — ABNORMAL HIGH (ref 14.0–49.7)
MCH: 36.7 pg — ABNORMAL HIGH (ref 25.1–34.0)
MCHC: 34.8 g/dL (ref 31.5–36.0)
MCV: 105.5 fL — ABNORMAL HIGH (ref 79.5–101.0)
MONO#: 0.3 10*3/uL (ref 0.1–0.9)
MONO%: 9.6 % (ref 0.0–14.0)
NEUT#: 0.9 10*3/uL — ABNORMAL LOW (ref 1.5–6.5)
NEUT%: 34.4 % — ABNORMAL LOW (ref 38.4–76.8)
Platelets: 253 10*3/uL (ref 145–400)
RBC: 2.8 10*6/uL — ABNORMAL LOW (ref 3.70–5.45)
RDW: 15.4 % — ABNORMAL HIGH (ref 11.2–14.5)
WBC: 2.6 10*3/uL — ABNORMAL LOW (ref 3.9–10.3)
lymph#: 1.4 10*3/uL (ref 0.9–3.3)

## 2016-12-09 ENCOUNTER — Other Ambulatory Visit: Payer: Self-pay | Admitting: Oncology

## 2016-12-09 ENCOUNTER — Encounter (HOSPITAL_COMMUNITY): Payer: Self-pay

## 2016-12-09 LAB — CA 125: Cancer Antigen (CA) 125: 106.2 U/mL — ABNORMAL HIGH (ref 0.0–38.1)

## 2016-12-10 ENCOUNTER — Other Ambulatory Visit: Payer: Self-pay | Admitting: Oncology

## 2016-12-12 ENCOUNTER — Other Ambulatory Visit: Payer: Self-pay | Admitting: Oncology

## 2016-12-12 NOTE — Progress Notes (Unsigned)
Called Kathryn Ramsey to make sure she was aware of the more recent results from her pathology which include her tumor being PD-L1 negative, but strongly ER positive, however PR negative.  She is considering her next move and has already discussed some of this with Dr. Denman George.  We reviewed the fact that [place] has 5 options, which are observation, a different PARP, an anti-estrogen, chemotherapy, or a clinical trial.  I told her that I generally reserve the antiestrogens as the very last treatment after all else fails and that my experience with that and ovarian cancer has not been encouraging.  I would not expect a second PARP inhibitor to work any better than the first 1, since we know she is BRCA negative.  Accordingly at this point is her options really come down to 3.  I think what she is interested in is observation for now but strong consideration of a clinical trial.  If there is one at [facility] including a stem cell trial that she is heard about, that would be terrific.  If not she is interested in considering [facility] and we can certainly facilitate that if that is her choice  She will see me again November 8.  Hopefully we will be able to make a decision at that time.

## 2016-12-13 ENCOUNTER — Encounter: Payer: Self-pay | Admitting: Gynecologic Oncology

## 2016-12-15 ENCOUNTER — Other Ambulatory Visit: Payer: Self-pay

## 2016-12-15 DIAGNOSIS — C5701 Malignant neoplasm of right fallopian tube: Secondary | ICD-10-CM

## 2016-12-15 MED ORDER — RUCAPARIB CAMSYLATE 300 MG PO TABS: 600.0000 mg | ORAL_TABLET | Freq: Two times a day (BID) | ORAL | 4 refills | Status: DC

## 2016-12-16 ENCOUNTER — Telehealth: Payer: Self-pay | Admitting: Gynecologic Oncology

## 2016-12-16 NOTE — Telephone Encounter (Signed)
Referral started with [facility].  Medical record number at [facility] is [id number].  Records faxed to [phone].  [facility] is to contact the patient with further instructions.

## 2016-12-16 NOTE — Progress Notes (Signed)
Kathryn Ramsey  [phone] [phone]     ID: Kathryn Ramsey DOB: September 03, 1965  MR#: [id number]  WIO#:[id number]  Patient Care Team: Harlan Stains, MD as PCP - General (Family Medicine) Gordy Levan, MD as Consulting Physician (Oncology) [name], MD as Consulting Physician (Obstetrics and Gynecology) Juanita Craver, MD as Consulting Physician (Gastroenterology) Servando Salina, MD as Consulting Physician (Obstetrics and Gynecology) Nickie Retort, MD as Consulting Physician (Urology) Gillis Ends, MD as Referring Physician (Obstetrics and Gynecology) OTHER MD:  CHIEF COMPLAINT: Recurrent ovarian cancer  CURRENT TREATMENT: anastrozole  INTERVAL HISTORY: Kathryn Ramsey returns today for a follow-up accompanied by her husband.  Since her last visit here she went off the rucaparib.  Since doing that she has had a little bit more energy in a way but in another way she is had some more back pain and she wonders if it is going off the medication or if it is the fact that she is exercising so vigorously.  Generally she "feels great".  She has an appointment with Dr. Festus Aloe at [facility] January 26, 2017.  She is aware that her CA 125 is doubling at every determination.  REVIEW OF SYSTEMS: Kathryn Ramsey reports she has been exercising a lot more by going to [employer] two times a week. She really enjoys the classes and adds that she feels stronger and overall healthier. She has noted that sometimes she will feel lightheaded afterwards and states that she hasn't been drinking as much water as she should be. She also goes for walks and participates in yoga classes.Pt has noticed a headache, mainly behind her eyes, that she thinks could be related to a number of issues. She plans to try allergy medication to see if it is related to her seasonal allergies. Since stopping Rubraca, she has become less constipated, but notes that it  hasn't completely subsided. She feels the most discomfort in her right abdomen, and states it feels lumpy. Lastly she endorses back pain that will radiate accross her back. She notices this mainly when she is tired. She denies unusual visual changes, nausea, vomiting, or dizziness. There has been no unusual cough, phlegm production, or pleurisy. This been no change in bladder habits. She denies unexplained fatigue or unexplained weight loss, bleeding, rash, or fever. A detailed review of systems was otherwise entirely stable.    HISTORY OF PRESENT ILLNESS: From Dr. Serita Grit original intake note 03/17/2015:  "Kathryn Ramsey is a very pleasant G4P4 who is seen in consultation at the request of Dr Collene Mares and Dr Garwin Brothers for peritoneal carcinomatosis. The patient has a history of a workup by urology for gross hematuria which included a pelvic examine was suggested for extrinsic mass effect on the bladder on cystoscopy. Cystoscopy took place on in December 2016. The patient denies abdominal pain bloating early satiety or abdominal distention.  On 08/03/2015 at [facility] she underwent a CT scan of the abdomen and pelvis as ordered by Dr Baruch Gouty. This revealed a 1.4 cm low attenuation lesion on the posterior right hepatic lobe suggestive of a hemangioma. Status post hysterectomy. No ovarian masses. Moderate ascites. Omental caking seen in the lateral left abdomen and pelvis. Peritoneal nodularity in the left lateral pelvic cul-de-sac. No gross extrinsic compression on the bladder was identified on imaging.  The patient was then seen by her gastroenterologist, Dr. Collene Mares, who performed a colonoscopy which was unremarkable.  Tumor markers were drawn on 08/04/2015 and these included a CA-125  that was elevated to 957, and a CEA that was normal at 1."  On 03/27/2015 the patient underwent diagnostic laparoscopy, exploratory laparotomy with bilateral salpingo-oophorectomy, omentectomy, and radical tumor  debulking with optimal cytoreduction (R0) of what proved to be a stage IIIc right fallopian tube cancer. She was treated adjuvantly with carboplatin and paclitaxel, as detailed below. Her CA 125 dropped from 1226 on 03/20/2015 to 26.1 by 06/16/2015.  Her subsequent history is as detailed below.  I PAST MEDICAL HISTORY: Past Medical History:  Diagnosis Date  . Acute sensory neuropathy ([place]) 06/26/2015  . Allergy   . Anemia   . Asthma    illness induced asthma  . Blood transfusion without reported diagnosis    at age 65 years old D/T surgery to femur being crushed  . 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 Heart murmur    pt, states had a "working heart murmur"  . History of kidney stones   . PONV (postoperative nausea and vomiting)   . Skin cancer     PAST SURGICAL HISTORY: Past Surgical History:  Procedure Laterality Date  . 3 laporscopic proceedures    . ABDOMINAL HYSTERECTOMY     2010  . CHOLECYSTECTOMY     2010  . DILATION AND CURETTAGE OF UTERUS     1997  . sinus surgery 1994      FAMILY HISTORY Family History  Problem Relation Age of Onset  . Hypertension Father   . Skin cancer Father        nonmelanoma skin cancers in his late 74s  . Non-Hodgkin's lymphoma Maternal Aunt        dx. 57s; smoker  . Stroke Maternal Grandmother   . Other Mother        benign meningioma dx. early-mid-70s; hysterectomy in her late 20s for heavy periods - still has ovaries  . Other Son        one son with pre-cancerous skin findings  . Other Daughter        cysts on ovaries and hx of heavy periods  . Other Sister        hysterectomy for cysts - still has ovaries  . Heart attack Maternal Grandfather   . Heart attack Paternal Grandfather   . Renal cancer Maternal Aunt 60       smoker  . Breast cancer Maternal Aunt 78  . Other Maternal Aunt        dx. benign brain tumor (meningioma) at 29; 2nd benign brain  tumor in her late 26s; hx of radical hysterectomy at age 49  . Brain cancer Other        NOS tumor  The patient's parents are both living, in their late 3s as of January 2018. The patient has one brother, 3 sisters. There is no history of ovarian cancer in the family. One maternal aunt had breast and kidney cancer, another had non-Hodgkin's lymphoma.  GYNECOLOGIC HISTORY:  No LMP recorded. Patient has had a hysterectomy. 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 Her husband [name] is a Engineer, maintenance (IT). Their children are 38, 60, 64 and 34 y/o as of JAN 2018. The oldest works as an Electrical engineer in the [employer] in [place]. The other 3 children live in [place], the youngest currently attending [employer].  Rhe patient has no grandchildren. She attends [employer]    ADVANCED DIRECTIVES:    HEALTH MAINTENANCE: Social History   Tobacco Use  . Smoking status: Never Smoker  . Smokeless tobacco: Never Used  Substance Use Topics  . Alcohol use: Yes    Comment:  3 glasses wine or beer/week  . Drug use: No     Colonoscopy:2016  PAP: s/p hyst  Bone density:   Allergies  Allergen Reactions  . Bee Venom Shortness Of Breath    swelling  . Sulfa Antibiotics Shortness Of Breath    Vomit , diarrhea , hives    Current Outpatient Medications  Medication Sig Dispense Refill  . docusate sodium (COLACE) 100 MG capsule Take 100 mg by mouth 2 (two) times daily.    [name] gabapentin (NEURONTIN) 100 MG capsule Take 1 capsule (100 mg total) by mouth at bedtime. Takes 177m at bedtime. Takes an additional 105mduring the day if needed for nerve pain. 90 capsule 4  . Lactobacillus (PROBIOTIC ACIDOPHILUS PO) Take 1 capsule by mouth every evening.    . nitrofurantoin (MACRODANTIN) 100 MG capsule Take 1 capsule (100 mg total) by mouth 2 (two) times daily. 10 capsule 0  . omeprazole (PRILOSEC) 40 MG  capsule Take 1 capsule (40 mg total) by mouth at bedtime. 90 capsule 4  . rucaparib camsylate (RUBRACA) 300 MG tablet Take 2 tablets (600 mg total) by mouth 2 (two) times daily. 120 tablet 4  . TURMERIC PO Take 1 tablet by mouth daily.    . valACYclovir (VALTREX) 1000 MG tablet Take 1 tablet (1,000 mg total) by mouth 2 (two) times daily. 30 tablet 3   No current facility-administered medications for this visit.     OBJECTIVE: middle aged White woman who appears well  Vitals:   12/23/16 0823  BP: 115/67  Pulse: (!) 55  Resp: 18  Temp: 98.2 F (36.8 C)  SpO2: 100%     Body mass index is 21.27 kg/m.    ECOG FS:1 - Symptomatic but completely ambulatory  Sclerae unicteric, EOMs intact Oropharynx clear and moist No cervical or supraclavicular adenopathy Lungs no rales or rhonchi Heart regular rate and rhythm Abd soft, nontender, positive bowel sounds MSK no focal spinal tenderness, no upper extremity lymphedema Neuro: nonfocal, well oriented, appropriate affect Breasts: Deferred Skin: I do not palpate any evidence of local recurrence at any of the port site metastases locations  Photo of right lower quadrant port-site metastases 03/15/2016    Photo 04/02/2016    LAB RESULTS:  Results for WOONYA, EUTSLERMRN [id number] of 12/23/2016 08:41  Ref. Range 09/29/2016 09:30 11/01/2016 08:53 11/24/2016 08:48 12/08/2016 09:42 12/22/2016 08:44  Cancer Antigen (CA) 125 Latest Ref Range: 0.0 - 38.1 U/mL 22.6 29.5 43.4 (H) 106.2 (H) 276.9 (H)    CMP     Component Value Date/Time   NA 136 12/08/2016 0943   K 4.1 12/08/2016 0943   CL 104 06/18/2015 1248   CO2 25 12/08/2016 0943   GLUCOSE 90 12/08/2016 0943   BUN 9.8 12/08/2016 0943   CREATININE 0.8 12/08/2016 0943   CALCIUM 9.6 12/08/2016 0943   PROT 7.8 12/08/2016 0943   ALBUMIN 4.7 12/08/2016 0943   AST 37 (H) 12/08/2016 0943   ALT 46 12/08/2016 0943   ALKPHOS 71 12/08/2016 0943   BILITOT 0.89 12/08/2016 0943   GFRNONAA >60  06/18/2015 1248   GFRAA >60 06/18/2015 1248    INo results found for: SPEP, UPEP  Lab  Results  Component Value Date   WBC 2.4 (L) 12/22/2016   NEUTROABS 1.0 (L) 12/22/2016   HGB 10.8 (L) 12/22/2016   HCT 31.8 (L) 12/22/2016   MCV 107.8 (H) 12/22/2016   PLT 271 12/22/2016      Chemistry      Component Value Date/Time   NA 136 12/08/2016 0943   K 4.1 12/08/2016 0943   CL 104 06/18/2015 1248   CO2 25 12/08/2016 0943   BUN 9.8 12/08/2016 0943   CREATININE 0.8 12/08/2016 0943      Component Value Date/Time   CALCIUM 9.6 12/08/2016 0943   ALKPHOS 71 12/08/2016 0943   AST 37 (H) 12/08/2016 0943   ALT 46 12/08/2016 0943   BILITOT 0.89 12/08/2016 0943       No results found for: LABCA2  No components found for: QVZDG387  No results for input(s): INR in the last 168 hours.  Urinalysis    Component Value Date/Time   COLORURINE YELLOW 03/29/2015 0920   APPEARANCEUR CLEAR 03/29/2015 0920   LABSPEC 1.005 10/09/2015 1239   PHURINE 6.0 10/09/2015 1239   PHURINE 7.5 03/29/2015 0920   GLUCOSEU Negative 10/09/2015 1239   HGBUR Negative 10/09/2015 1239   HGBUR SMALL (A) 03/29/2015 0920   BILIRUBINUR Negative 10/09/2015 1239   KETONESUR Negative 10/09/2015 1239   KETONESUR NEGATIVE 03/29/2015 0920   PROTEINUR Negative 10/09/2015 1239   PROTEINUR NEGATIVE 03/29/2015 0920   UROBILINOGEN 0.2 10/09/2015 1239   NITRITE Negative 10/09/2015 1239   NITRITE NEGATIVE 03/29/2015 0920   LEUKOCYTESUR Negative 10/09/2015 1239     STUDIES: Ct Abdomen Pelvis W Contrast  Result Date: 11/30/2016 CLINICAL DATA:  History of 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 Patient presents for restaging with new elevation of CA 125. EXAM: CT ABDOMEN AND PELVIS WITH CONTRAST TECHNIQUE: Multidetector CT  imaging of the abdomen and pelvis was performed using the standard protocol following bolus administration of intravenous contrast. CONTRAST:  19m ISOVUE-300 IOPAMIDOL (ISOVUE-300) INJECTION 61% COMPARISON:  09/30/2016 CT abdomen/ pelvis. FINDINGS: Lower chest: No significant pulmonary nodules or acute consolidative airspace disease. Hepatobiliary: Normal liver size. Posterior right upper lobe 1.4 cm hemangioma (series 2/image 19) is stable since 02/13/2010 CT. No new liver lesions. Cholecystectomy. Bile ducts are stable and within normal post cholecystectomy limits. Pancreas: Normal, with no mass or duct dilation. Spleen: Normal size. No mass. Adrenals/Urinary Tract: Normal adrenals. Normal kidneys with no hydronephrosis and no renal mass. Normal bladder. Stomach/Bowel: Grossly normal stomach. Normal caliber small bowel with no small bowel wall thickening. Normal diminutive appendix. Normal large bowel with no diverticulosis, large bowel wall thickening or pericolonic fat stranding. Vascular/Lymphatic: Normal caliber abdominal aorta. Patent portal, splenic, hepatic and renal veins. No pathologically enlarged lymph nodes in the abdomen or pelvis. Reproductive: Status post hysterectomy, with no abnormal findings at the vaginal cuff. No adnexal mass. Other: No pneumoperitoneum, ascites or focal fluid collection. There is a 0.9 x 0.6 cm nodule in the left paracolic gutter (series 2/ image 47), previously 0.6 x 0.4 cm, slightly increased. No additional discrete peritoneal nodules. Musculoskeletal: No aggressive appearing focal osseous lesions. IMPRESSION: 1. Slight growth of a 0.9 cm left paracolic gutter nodule, compatible with recurrent peritoneal metastasis. No additional discrete peritoneal nodules. No ascites. No adnexal masses. 2. No abdominopelvic adenopathy. Electronically Signed   By: JIlona SorrelM.D.   On: 11/30/2016 09:00  ELIGIBLE FOR AVAILABLE RESEARCH PROTOCOL:  discussing protocols at  [place]: 51 y.o. [place] woman status post radical tumor debulking with optimal cytoreduction (R0) 03/27/2015 for a stage IIIC, high-grade right fallopian tube carcinoma  (a) baseline CA-125 was1226.  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)  (1) adjuvant chemotherapy consisted of carboplatin and paclitaxel for 6 doses, begun 04/14/2015, completed 08/11/2015  (a) paclitaxel was omitted from cycle 5 and dose reduced on cycle 6 because of neuropathy   (b) a "make up" dose of paclitaxel was given 08/25/2015  (c) last carboplatin dose was 08/11/2015  (d) CA-125 normalized by 06/16/2015   (2) FIRST RECURRENCE: January 2018  (a) CA-125 rise beginning November 2017 led to CT scans and PET scans December 2017, all negative  (b) exploratory laparotomy 02/24/2016 showed pathologically confirmed recurrence, with miliary disease involving all examined surfaces  (c) port-site metastases noted 03/08/2016  (3) carboplatin/liposomal doxorubicin started 03/05/2016, repeated every three weeks x4, completed 06/04/2016.  (a) cycle 3 delayed one week because of neutropenia; OnPro added  (4) RUCAPARIB started 07/02/2016  (a) restaging 10/01/2016: Normal CA 125, negative CT of the abdomen and pelvis  (b) labs 11/24/2016 shows a rise in her CA 125-43.4, with continuing rise thereafter  (c) rucaparib discontinued October 2018  (5) anastrozole started December 23, 2016  PLAN I spent approximately 30 minutes with Kathryn Ramsey with most of that time spent discussing her complex problems.  She has disease progression, but remains essentially a 7.  I think the back pain she is experiencing may be related to the very strenuous exercise program she is under.  Also the slight fainting she feels right after exercise probably is due to her  dehydration and today we discussed how to make her own rehydration solution so she can avoid the excessive sugar and coloring and Gatorade and similar.  I reassured her that I do not feel any evidence of skin recurrence at her port site metastases locations.  We discussed her options which include observation alone (she is aware that we do not have data that treatment in the absence of symptoms in the setting improve survival), any number of chemotherapy agents, a separate PARP inhibitor, antiestrogens, or a clinical trial.  Since she is about to be evaluated at [facility], it seems to me the best option is not to start chemotherapy at this point.  That will only muddy the waters.  On the other hand she could I think start anastrozole now.  We discussed the possible toxicities, side effects and complications of this agent.  Starting anastrozole will tell us several things: How well she tolerates it; whether her CA 125 drops or changes or stabilizes on this agent  Accordingly she will start anastrozole today.  We will repeat a CA 125 in 2 weeks in 4 weeks.  She will have that information available when she goes to [facility].  She will return to see me the last Friday in December.  At that time we should be able to operation to lyse what ever plan was decided with Dr. Gardenia Phlegm  She knows to call for any issues that may develop before that visit  Kathryn Ramsey, Virgie Dad, MD  12/23/16 8:58 AM Medical Oncology and Hematology [facility] [place],  [address] Tel. [phone]    Fax. [phone]  This document  serves as a record of services personally performed by Chauncey Cruel, MD. It was created on his behalf by Margit Banda, a trained medical scribe. The creation of this record is based on the scribe's personal observations and the provider's statements to them.    I have reviewed the above documentation for accuracy and completeness, and I agree  with the above.

## 2016-12-21 ENCOUNTER — Other Ambulatory Visit: Payer: Self-pay | Admitting: *Deleted

## 2016-12-21 ENCOUNTER — Telehealth: Payer: Self-pay | Admitting: *Deleted

## 2016-12-21 ENCOUNTER — Telehealth: Payer: Self-pay

## 2016-12-21 DIAGNOSIS — Z90710 Acquired absence of both cervix and uterus: Secondary | ICD-10-CM

## 2016-12-21 DIAGNOSIS — C762 Malignant neoplasm of abdomen: Secondary | ICD-10-CM

## 2016-12-21 DIAGNOSIS — C5701 Malignant neoplasm of right fallopian tube: Secondary | ICD-10-CM

## 2016-12-21 DIAGNOSIS — N951 Menopausal and female climacteric states: Secondary | ICD-10-CM

## 2016-12-21 DIAGNOSIS — C786 Secondary malignant neoplasm of retroperitoneum and peritoneum: Secondary | ICD-10-CM

## 2016-12-21 DIAGNOSIS — N904 Leukoplakia of vulva: Secondary | ICD-10-CM

## 2016-12-21 DIAGNOSIS — C801 Malignant (primary) neoplasm, unspecified: Secondary | ICD-10-CM

## 2016-12-21 NOTE — Telephone Encounter (Signed)
Langley Gauss was calling to inform Dr. Serita Grit office of [facility] consult appointment with Dr. Festus Aloe on 01-26-17. Patient is aware of appointment.

## 2016-12-21 NOTE — Telephone Encounter (Signed)
Fax new patient referral to Dr. Lonia Mad office at [facility] in [place] to [phone].

## 2016-12-22 ENCOUNTER — Other Ambulatory Visit (HOSPITAL_BASED_OUTPATIENT_CLINIC_OR_DEPARTMENT_OTHER): Payer: 59

## 2016-12-22 DIAGNOSIS — N904 Leukoplakia of vulva: Secondary | ICD-10-CM

## 2016-12-22 DIAGNOSIS — C801 Malignant (primary) neoplasm, unspecified: Secondary | ICD-10-CM

## 2016-12-22 DIAGNOSIS — C762 Malignant neoplasm of abdomen: Secondary | ICD-10-CM

## 2016-12-22 DIAGNOSIS — C569 Malignant neoplasm of unspecified ovary: Secondary | ICD-10-CM

## 2016-12-22 DIAGNOSIS — C5701 Malignant neoplasm of right fallopian tube: Secondary | ICD-10-CM

## 2016-12-22 DIAGNOSIS — N951 Menopausal and female climacteric states: Secondary | ICD-10-CM

## 2016-12-22 DIAGNOSIS — Z90710 Acquired absence of both cervix and uterus: Secondary | ICD-10-CM

## 2016-12-22 DIAGNOSIS — C786 Secondary malignant neoplasm of retroperitoneum and peritoneum: Secondary | ICD-10-CM

## 2016-12-22 LAB — CBC WITH DIFFERENTIAL/PLATELET
BASO%: 0.4 % (ref 0.0–2.0)
Basophils Absolute: 0 10*3/uL (ref 0.0–0.1)
EOS%: 0.4 % (ref 0.0–7.0)
Eosinophils Absolute: 0 10*3/uL (ref 0.0–0.5)
HCT: 31.8 % — ABNORMAL LOW (ref 34.8–46.6)
HGB: 10.8 g/dL — ABNORMAL LOW (ref 11.6–15.9)
LYMPH%: 49.4 % (ref 14.0–49.7)
MCH: 36.6 pg — ABNORMAL HIGH (ref 25.1–34.0)
MCHC: 34 g/dL (ref 31.5–36.0)
MCV: 107.8 fL — ABNORMAL HIGH (ref 79.5–101.0)
MONO#: 0.2 10*3/uL (ref 0.1–0.9)
MONO%: 9.1 % (ref 0.0–14.0)
NEUT#: 1 10*3/uL — ABNORMAL LOW (ref 1.5–6.5)
NEUT%: 40.7 % (ref 38.4–76.8)
Platelets: 271 10*3/uL (ref 145–400)
RBC: 2.95 10*6/uL — ABNORMAL LOW (ref 3.70–5.45)
RDW: 16.2 % — ABNORMAL HIGH (ref 11.2–14.5)
WBC: 2.4 10*3/uL — ABNORMAL LOW (ref 3.9–10.3)
lymph#: 1.2 10*3/uL (ref 0.9–3.3)

## 2016-12-23 ENCOUNTER — Telehealth: Payer: Self-pay | Admitting: Oncology

## 2016-12-23 ENCOUNTER — Other Ambulatory Visit: Payer: 59

## 2016-12-23 ENCOUNTER — Ambulatory Visit (HOSPITAL_BASED_OUTPATIENT_CLINIC_OR_DEPARTMENT_OTHER): Payer: 59 | Admitting: Oncology

## 2016-12-23 VITALS — BP 115/67 | HR 55 | Temp 98.2°F | Resp 18 | Ht 63.0 in | Wt 120.1 lb

## 2016-12-23 DIAGNOSIS — C801 Malignant (primary) neoplasm, unspecified: Secondary | ICD-10-CM

## 2016-12-23 DIAGNOSIS — C5701 Malignant neoplasm of right fallopian tube: Secondary | ICD-10-CM | POA: Diagnosis not present

## 2016-12-23 DIAGNOSIS — C762 Malignant neoplasm of abdomen: Secondary | ICD-10-CM

## 2016-12-23 DIAGNOSIS — C786 Secondary malignant neoplasm of retroperitoneum and peritoneum: Secondary | ICD-10-CM | POA: Diagnosis not present

## 2016-12-23 LAB — CA 125: Cancer Antigen (CA) 125: 276.9 U/mL — ABNORMAL HIGH (ref 0.0–38.1)

## 2016-12-23 LAB — ESTRADIOL: Estradiol: <5 pg/mL

## 2016-12-23 MED ORDER — ANASTROZOLE 1 MG PO TABS: 1.0000 mg | ORAL_TABLET | Freq: Every day | ORAL | 4 refills | Status: DC

## 2016-12-23 NOTE — Telephone Encounter (Signed)
Scheduled appt per 11/ 8 los. Patient did not want avs or calendar.

## 2017-01-05 ENCOUNTER — Other Ambulatory Visit (HOSPITAL_BASED_OUTPATIENT_CLINIC_OR_DEPARTMENT_OTHER): Payer: 59

## 2017-01-05 ENCOUNTER — Other Ambulatory Visit: Payer: Self-pay | Admitting: Oncology

## 2017-01-05 DIAGNOSIS — C5701 Malignant neoplasm of right fallopian tube: Secondary | ICD-10-CM

## 2017-01-05 DIAGNOSIS — C786 Secondary malignant neoplasm of retroperitoneum and peritoneum: Secondary | ICD-10-CM

## 2017-01-05 DIAGNOSIS — C801 Malignant (primary) neoplasm, unspecified: Secondary | ICD-10-CM

## 2017-01-05 DIAGNOSIS — C762 Malignant neoplasm of abdomen: Secondary | ICD-10-CM

## 2017-01-05 LAB — CBC WITH DIFFERENTIAL/PLATELET
BASO%: 0.3 % (ref 0.0–2.0)
Basophils Absolute: 0 10*3/uL (ref 0.0–0.1)
EOS%: 0.7 % (ref 0.0–7.0)
Eosinophils Absolute: 0 10*3/uL (ref 0.0–0.5)
HCT: 35 % (ref 34.8–46.6)
HGB: 11.8 g/dL (ref 11.6–15.9)
LYMPH%: 53.6 % — ABNORMAL HIGH (ref 14.0–49.7)
MCH: 36.6 pg — ABNORMAL HIGH (ref 25.1–34.0)
MCHC: 33.7 g/dL (ref 31.5–36.0)
MCV: 108.7 fL — ABNORMAL HIGH (ref 79.5–101.0)
MONO#: 0.2 10*3/uL (ref 0.1–0.9)
MONO%: 7.6 % (ref 0.0–14.0)
NEUT#: 1.1 10*3/uL — ABNORMAL LOW (ref 1.5–6.5)
NEUT%: 37.8 % — ABNORMAL LOW (ref 38.4–76.8)
Platelets: 200 10*3/uL (ref 145–400)
RBC: 3.22 10*6/uL — ABNORMAL LOW (ref 3.70–5.45)
RDW: 14 % (ref 11.2–14.5)
WBC: 2.9 10*3/uL — ABNORMAL LOW (ref 3.9–10.3)
lymph#: 1.6 10*3/uL (ref 0.9–3.3)

## 2017-01-06 LAB — CA 125: Cancer Antigen (CA) 125: 666 U/mL — ABNORMAL HIGH (ref 0.0–38.1)

## 2017-01-14 ENCOUNTER — Encounter: Payer: Self-pay | Admitting: Gynecologic Oncology

## 2017-01-19 ENCOUNTER — Ambulatory Visit (HOSPITAL_BASED_OUTPATIENT_CLINIC_OR_DEPARTMENT_OTHER): Payer: 59

## 2017-01-19 ENCOUNTER — Other Ambulatory Visit (HOSPITAL_BASED_OUTPATIENT_CLINIC_OR_DEPARTMENT_OTHER): Payer: 59

## 2017-01-19 ENCOUNTER — Encounter: Payer: Self-pay | Admitting: Oncology

## 2017-01-19 ENCOUNTER — Encounter (INDEPENDENT_AMBULATORY_CARE_PROVIDER_SITE_OTHER): Payer: Self-pay

## 2017-01-19 VITALS — BP 109/63 | HR 55 | Temp 98.5°F | Resp 20

## 2017-01-19 DIAGNOSIS — C801 Malignant (primary) neoplasm, unspecified: Secondary | ICD-10-CM

## 2017-01-19 DIAGNOSIS — C5701 Malignant neoplasm of right fallopian tube: Secondary | ICD-10-CM

## 2017-01-19 DIAGNOSIS — Z23 Encounter for immunization: Secondary | ICD-10-CM | POA: Diagnosis not present

## 2017-01-19 DIAGNOSIS — C762 Malignant neoplasm of abdomen: Secondary | ICD-10-CM

## 2017-01-19 DIAGNOSIS — C786 Secondary malignant neoplasm of retroperitoneum and peritoneum: Secondary | ICD-10-CM

## 2017-01-19 LAB — CBC WITH DIFFERENTIAL/PLATELET
BASO%: 0.3 % (ref 0.0–2.0)
Basophils Absolute: 0 10*3/uL (ref 0.0–0.1)
EOS%: 0.3 % (ref 0.0–7.0)
Eosinophils Absolute: 0 10*3/uL (ref 0.0–0.5)
HCT: 36.4 % (ref 34.8–46.6)
HGB: 12.2 g/dL (ref 11.6–15.9)
LYMPH%: 52.7 % — ABNORMAL HIGH (ref 14.0–49.7)
MCH: 36 pg — ABNORMAL HIGH (ref 25.1–34.0)
MCHC: 33.5 g/dL (ref 31.5–36.0)
MCV: 107.4 fL — ABNORMAL HIGH (ref 79.5–101.0)
MONO#: 0.2 10*3/uL (ref 0.1–0.9)
MONO%: 7.8 % (ref 0.0–14.0)
NEUT#: 1.1 10*3/uL — ABNORMAL LOW (ref 1.5–6.5)
NEUT%: 38.9 % (ref 38.4–76.8)
Platelets: 239 10*3/uL (ref 145–400)
RBC: 3.39 10*6/uL — ABNORMAL LOW (ref 3.70–5.45)
RDW: 12.8 % (ref 11.2–14.5)
WBC: 2.9 10*3/uL — ABNORMAL LOW (ref 3.9–10.3)
lymph#: 1.6 10*3/uL (ref 0.9–3.3)

## 2017-01-19 MED ORDER — INFLUENZA VAC SPLIT QUAD 0.5 ML IM SUSY
0.5000 mL | PREFILLED_SYRINGE | Freq: Once | INTRAMUSCULAR | Status: AC
  Administered 2017-01-19: 0.5 mL via INTRAMUSCULAR
  Filled 2017-01-19: qty 0.5

## 2017-01-19 NOTE — Patient Instructions (Signed)

## 2017-01-20 ENCOUNTER — Other Ambulatory Visit: Payer: Self-pay | Admitting: Oncology

## 2017-01-20 LAB — CA 125: Cancer Antigen (CA) 125: 1239 U/mL — ABNORMAL HIGH (ref 0.0–38.1)

## 2017-01-20 NOTE — Progress Notes (Unsigned)
Kathryn Ramsey called to report some concerns.  Of course her CA 125 is rapidly doubling.  She will be at [facility] 01/26/2017.  They are going to be leaving from [place] so I do not think the snow is going to delay their flight.  We talked about the questions she needs to get clarified over there and she wanted to know what we would do when she understands we would do chemotherapy here.  The actual agent does not matter so much.  She will eventually receive all the agents and some kind of sequence and a particular sequence does not matter to the ultimate outcome which is 1 of multiple response and some multiple relapses.  Currently she is having no evidence of small bowel obstruction or bladder obstruction.  She is having no significant pain  She will call as soon as she comes back from [facility] [place] and I also gave her my pager number in case she has questions while she is there.

## 2017-01-25 ENCOUNTER — Encounter (INDEPENDENT_AMBULATORY_CARE_PROVIDER_SITE_OTHER): Payer: Self-pay

## 2017-01-28 ENCOUNTER — Encounter (INDEPENDENT_AMBULATORY_CARE_PROVIDER_SITE_OTHER): Payer: Self-pay

## 2017-01-28 ENCOUNTER — Encounter: Payer: Self-pay | Admitting: Oncology

## 2017-01-28 ENCOUNTER — Other Ambulatory Visit: Payer: Self-pay | Admitting: Oncology

## 2017-01-31 ENCOUNTER — Telehealth: Payer: Self-pay | Admitting: Oncology

## 2017-01-31 ENCOUNTER — Encounter: Payer: Self-pay | Admitting: Adult Health

## 2017-01-31 ENCOUNTER — Ambulatory Visit (HOSPITAL_BASED_OUTPATIENT_CLINIC_OR_DEPARTMENT_OTHER): Payer: 59 | Admitting: Adult Health

## 2017-01-31 VITALS — BP 122/63 | HR 61 | Temp 97.8°F | Resp 17 | Ht 63.0 in | Wt 118.4 lb

## 2017-01-31 DIAGNOSIS — C801 Malignant (primary) neoplasm, unspecified: Secondary | ICD-10-CM

## 2017-01-31 DIAGNOSIS — C762 Malignant neoplasm of abdomen: Secondary | ICD-10-CM

## 2017-01-31 DIAGNOSIS — Z7189 Other specified counseling: Secondary | ICD-10-CM

## 2017-01-31 DIAGNOSIS — C5701 Malignant neoplasm of right fallopian tube: Secondary | ICD-10-CM

## 2017-01-31 DIAGNOSIS — C786 Secondary malignant neoplasm of retroperitoneum and peritoneum: Secondary | ICD-10-CM

## 2017-01-31 NOTE — Patient Instructions (Signed)
Gemcitabine injection  What is this medicine?  GEMCITABINE (jem SIT a been) is a chemotherapy drug. This medicine is used to treat many types of cancer like breast cancer, lung cancer, pancreatic cancer, and ovarian cancer.  This medicine may be used for other purposes; ask your health care provider or pharmacist if you have questions.  COMMON BRAND NAME(S): Gemzar  What should I tell my health care provider before I take this medicine?  They need to know if you have any of these conditions:  -blood disorders  -infection  -kidney disease  -liver disease  -recent or ongoing radiation therapy  -an unusual or allergic reaction to gemcitabine, other chemotherapy, other medicines, foods, dyes, or preservatives  -pregnant or trying to get pregnant  -breast-feeding  How should I use this medicine?  This drug is given as an infusion into a vein. It is administered in a hospital or clinic by a specially trained health care professional.  Talk to your pediatrician regarding the use of this medicine in children. Special care may be needed.  Overdosage: If you think you have taken too much of this medicine contact a poison control center or emergency room at once.  NOTE: This medicine is only for you. Do not share this medicine with others.  What if I miss a dose?  It is important not to miss your dose. Call your doctor or health care professional if you are unable to keep an appointment.  What may interact with this medicine?  -medicines to increase blood counts like filgrastim, pegfilgrastim, sargramostim  -some other chemotherapy drugs like cisplatin  -vaccines  Talk to your doctor or health care professional before taking any of these medicines:  -acetaminophen  -aspirin  -ibuprofen  -ketoprofen  -naproxen  This list may not describe all possible interactions. Give your health care provider a list of all the medicines, herbs, non-prescription drugs, or dietary supplements you use. Also tell them if you smoke, drink alcohol,  or use illegal drugs. Some items may interact with your medicine.  What should I watch for while using this medicine?  Visit your doctor for checks on your progress. This drug may make you feel generally unwell. This is not uncommon, as chemotherapy can affect healthy cells as well as cancer cells. Report any side effects. Continue your course of treatment even though you feel ill unless your doctor tells you to stop.  In some cases, you may be given additional medicines to help with side effects. Follow all directions for their use.  Call your doctor or health care professional for advice if you get a fever, chills or sore throat, or other symptoms of a cold or flu. Do not treat yourself. This drug decreases your body's ability to fight infections. Try to avoid being around people who are sick.  This medicine may increase your risk to bruise or bleed. Call your doctor or health care professional if you notice any unusual bleeding.  Be careful brushing and flossing your teeth or using a toothpick because you may get an infection or bleed more easily. If you have any dental work done, tell your dentist you are receiving this medicine.  Avoid taking products that contain aspirin, acetaminophen, ibuprofen, naproxen, or ketoprofen unless instructed by your doctor. These medicines may hide a fever.  Women should inform their doctor if they wish to become pregnant or think they might be pregnant. There is a potential for serious side effects to an unborn child. Talk to your health   care professional or pharmacist for more information. Do not breast-feed an infant while taking this medicine.  What side effects may I notice from receiving this medicine?  Side effects that you should report to your doctor or health care professional as soon as possible:  -allergic reactions like skin rash, itching or hives, swelling of the face, lips, or tongue  -low blood counts - this medicine may decrease the number of white blood cells,  red blood cells and platelets. You may be at increased risk for infections and bleeding.  -signs of infection - fever or chills, cough, sore throat, pain or difficulty passing urine  -signs of decreased platelets or bleeding - bruising, pinpoint red spots on the skin, black, tarry stools, blood in the urine  -signs of decreased red blood cells - unusually weak or tired, fainting spells, lightheadedness  -breathing problems  -chest pain  -mouth sores  -nausea and vomiting  -pain, swelling, redness at site where injected  -pain, tingling, numbness in the hands or feet  -stomach pain  -swelling of ankles, feet, hands  -unusual bleeding  Side effects that usually do not require medical attention (report to your doctor or health care professional if they continue or are bothersome):  -constipation  -diarrhea  -hair loss  -loss of appetite  -stomach upset  This list may not describe all possible side effects. Call your doctor for medical advice about side effects. You may report side effects to FDA at 1-800-FDA-1088.  Where should I keep my medicine?  This drug is given in a hospital or clinic and will not be stored at home.  NOTE: This sheet is a summary. It may not cover all possible information. If you have questions about this medicine, talk to your doctor, pharmacist, or health care provider.   2018 [employer] Standard (2007-06-13 18:45:54)  Bevacizumab injection  What is this medicine?  BEVACIZUMAB (be va SIZ yoo mab) is a monoclonal antibody. It is used to treat many types of cancer.  This medicine may be used for other purposes; ask your health care provider or pharmacist if you have questions.  COMMON BRAND NAME(S): Avastin  What should I tell my health care provider before I take this medicine?  They need to know if you have any of these conditions:  -diabetes  -heart disease  -high blood pressure  -history of coughing up blood  -prior anthracycline chemotherapy (e.g., doxorubicin, daunorubicin,  epirubicin)  -recent or ongoing radiation therapy  -recent or planning to have surgery  -stroke  -an unusual or allergic reaction to bevacizumab, hamster proteins, mouse proteins, other medicines, foods, dyes, or preservatives  -pregnant or trying to get pregnant  -breast-feeding  How should I use this medicine?  This medicine is for infusion into a vein. It is given by a health care professional in a hospital or clinic setting.  Talk to your pediatrician regarding the use of this medicine in children. Special care may be needed.  Overdosage: If you think you have taken too much of this medicine contact a poison control center or emergency room at once.  NOTE: This medicine is only for you. Do not share this medicine with others.  What if I miss a dose?  It is important not to miss your dose. Call your doctor or health care professional if you are unable to keep an appointment.  What may interact with this medicine?  Interactions are not expected.  This list may not describe all possible interactions. Give your health   care provider a list of all the medicines, herbs, non-prescription drugs, or dietary supplements you use. Also tell them if you smoke, drink alcohol, or use illegal drugs. Some items may interact with your medicine.  What should I watch for while using this medicine?  Your condition will be monitored carefully while you are receiving this medicine. You will need important blood work and urine testing done while you are taking this medicine.  This medicine may increase your risk to bruise or bleed. Call your doctor or health care professional if you notice any unusual bleeding.  This medicine should be started at least 28 days following major surgery and the site of the surgery should be totally healed. Check with your doctor before scheduling dental work or surgery while you are receiving this treatment. Talk to your doctor if you have recently had surgery or if you have a wound that has not  healed.  Do not become pregnant while taking this medicine or for 6 months after stopping it. Women should inform their doctor if they wish to become pregnant or think they might be pregnant. There is a potential for serious side effects to an unborn child. Talk to your health care professional or pharmacist for more information. Do not breast-feed an infant while taking this medicine and for 6 months after the last dose.  This medicine has caused ovarian failure in some women. This medicine may interfere with the ability to have a child. You should talk to your doctor or health care professional if you are concerned about your fertility.  What side effects may I notice from receiving this medicine?  Side effects that you should report to your doctor or health care professional as soon as possible:  -allergic reactions like skin rash, itching or hives, swelling of the face, lips, or tongue  -chest pain or chest tightness  -chills  -coughing up blood  -high fever  -seizures  -severe constipation  -signs and symptoms of bleeding such as bloody or black, tarry stools; red or dark-brown urine; spitting up blood or brown material that looks like coffee grounds; red spots on the skin; unusual bruising or bleeding from the eye, gums, or nose  -signs and symptoms of a blood clot such as breathing problems; chest pain; severe, sudden headache; pain, swelling, warmth in the leg  -signs and symptoms of a stroke like changes in vision; confusion; trouble speaking or understanding; severe headaches; sudden numbness or weakness of the face, arm or leg; trouble walking; dizziness; loss of balance or coordination  -stomach pain  -sweating  -swelling of legs or ankles  -vomiting  -weight gain  Side effects that usually do not require medical attention (report to your doctor or health care professional if they continue or are bothersome):  -back pain  -changes in taste  -decreased appetite  -dry skin  -nausea  -tiredness  This list  may not describe all possible side effects. Call your doctor for medical advice about side effects. You may report side effects to FDA at 1-800-FDA-1088.  Where should I keep my medicine?  This drug is given in a hospital or clinic and will not be stored at home.  NOTE: This sheet is a summary. It may not cover all possible information. If you have questions about this medicine, talk to your doctor, pharmacist, or health care provider.   2018 [employer]/Gold Standard (2016-01-30 14:33:29)

## 2017-01-31 NOTE — Telephone Encounter (Signed)
Scheduled appt per 12/17 sch msg - spoke with patient regarding appts.

## 2017-01-31 NOTE — Progress Notes (Signed)
DISCONTINUE ON PATHWAY REGIMEN - Ovarian     A cycle is every 28 days:     Liposomal doxorubicin        Dose Mod: None     Carboplatin        Dose Mod: None  **Always confirm dose/schedule in your pharmacy ordering system**    REASON: Disease Progression PRIOR TREATMENT: [id number]: Liposomal Doxorubicin (Doxil) 30 mg/m2 + Carboplatin AUC=5 q28 Days; Stop Treatment After 6 Cycles If Complete Response, Otherwise Continue Treatment Until Progression or Unacceptable Toxicity TREATMENT RESPONSE: Unable to Evaluate  START OFF PATHWAY REGIMEN - Ovarian   [id number]:Gemcitabine 1,000 mg/m2 D1, 8  q21 Days:   A cycle is every 21 days:     Gemcitabine   **Always confirm dose/schedule in your pharmacy ordering system**    Patient Characteristics: Recurrent or Progressive Disease, Second Line Therapy, 6-12 Months AJCC T Category: T3b AJCC N Category: NX AJCC M Category: M0 Therapeutic Status: Recurrent or Progressive Disease AJCC 8 Stage Grouping: IIIB Line of Therapy: Second Line BRCA Mutation Status: Absent Would you be surprised if this patient died  in the next year<= I would be surprised if this patient died in the next year Progression on neoadjuvant therapy<= No Time since last treatment: 6 - 12 Months Intent of Therapy: Non-Curative / Palliative Intent, Not Discussed with Patient 

## 2017-01-31 NOTE — Progress Notes (Addendum)
[place]  Telephone:(336) [phone] Fax:(336) [phone]     ID: Kathryn Ramsey DOB: [birth year]  MR#: [id number]  EPP#:[id number]  Patient Care Team: Harlan Stains, MD as PCP - General (Family Medicine) Gordy Levan, MD as Consulting Physician (Oncology) [name], MD as Consulting Physician (Obstetrics and Gynecology) Juanita Craver, MD as Consulting Physician (Gastroenterology) Servando Salina, MD as Consulting Physician (Obstetrics and Gynecology) Nickie Retort, MD as Consulting Physician (Urology) Gillis Ends, MD as Referring Physician (Obstetrics and Gynecology) OTHER MD: Festus Aloe [id number])  CHIEF COMPLAINT: Recurrent ovarian cancer  CURRENT TREATMENT: Gemcitabine and Avastin to start 12/21  INTERVAL HISTORY: Kathryn Ramsey is here today for follow up of her ovarian cancer.  Since her last appt with [name] she went down to see Dr. Robina Ade at [facility] for his evaluation.  She was very happy with his eval of her.  She understood from her visit with him that due to the fact that her cancer isn't measurable on a scan, she isn't eligible for a clinical trial.  However her CA-125 is increasing quickly, so there is cancer activity in her body.  She was given recommendations for treatment, and they believe the nodule read by our radiologists is actually a blood vessel.  She is very happy with this.    REVIEW OF SYSTEMS: Kathryn Ramsey is doing well today.  Since her appointment she has stopped the Anastrozole.  She does have an abdominal discomfort/bloating, and occasional tenderness, but denies any other issues today.  She continues to exercise and is overall feeling well.  A detailed ROS is otherwise non contributory today.   HISTORY OF PRESENT ILLNESS: From Dr. Serita Grit original intake note 03/17/2015:  "Kathryn Ramsey is a very pleasant G4P4 who is seen in consultation at the request of Dr Collene Mares and Dr Garwin Brothers for peritoneal carcinomatosis. The patient has a  history of a workup by urology for gross hematuria which included a pelvic examine was suggested for extrinsic mass effect on the bladder on cystoscopy. Cystoscopy took place on in December 2016. The patient denies abdominal pain bloating early satiety or abdominal distention.  On 08/03/2015 at [facility] she underwent a CT scan of the abdomen and pelvis as ordered by Dr Baruch Gouty. This revealed a 1.4 cm low attenuation lesion on the posterior right hepatic lobe suggestive of a hemangioma. Status post hysterectomy. No ovarian masses. Moderate ascites. Omental caking seen in the lateral left abdomen and pelvis. Peritoneal nodularity in the left lateral pelvic cul-de-sac. No gross extrinsic compression on the bladder was identified on imaging.  The patient was then seen by her gastroenterologist, Dr. Collene Mares, who performed a colonoscopy which was unremarkable.  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 She was treated adjuvantly with carboplatin and paclitaxel, as detailed below. Her CA 125 dropped from 1226 on 03/20/2015 to 26.1 by 06/16/2015.  Her subsequent history is as detailed below.  I PAST MEDICAL HISTORY: Past Medical History:  Diagnosis Date  . Acute sensory neuropathy ([place]) 06/26/2015  . Allergy   . Anemia   . Asthma    illness induced asthma  . Blood transfusion without reported diagnosis    at age 51 years old D/T surgery  to femur being crushed  . 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 Heart murmur 51    pt, states had a "working heart murmur"  . History of kidney  stones   . PONV (postoperative nausea and vomiting)   . Skin cancer     PAST SURGICAL HISTORY: Past Surgical History:  Procedure Laterality Date  . 3 laporscopic proceedures    . ABDOMINAL HYSTERECTOMY     2010  . CHOLECYSTECTOMY     2010  . DILATION AND CURETTAGE OF UTERUS     1997  . LAPAROSCOPY N/A 03/27/2015   Procedure: DIAGNOSTIC LAPAROSCOPY ;  Surgeon: [name], MD;  Location: [facility];  Service: Gynecology;  Laterality: N/A;  . LAPAROSCOPY N/A 02/24/2016   Procedure: LAPAROSCOPY DIAGNOSTIC WITH PERITONEAL WASHINGS AND PERITONEAL BIOPSY;  Surgeon: [name], MD;  Location: [facility];  Service: Gynecology;  Laterality: N/A;  . LAPAROTOMY N/A 03/27/2015   Procedure: EXPLORATORY LAPAROTOMY, BILATERAL SALPINGO OOPHORECTOMY, OMENTECTOMY, RADICAL TUMOR DEBULKING;  Surgeon: [name], MD;  Location: [facility];  Service: Gynecology;  Laterality: N/A;  . sinus surgery 1994      FAMILY HISTORY Family History  Problem Relation Age of Onset  . Hypertension Father   . Skin cancer Father        nonmelanoma skin cancers in his late 21s  . Non-Hodgkin's lymphoma Maternal Aunt        dx. 72s; smoker  . Stroke Maternal Grandmother   . Other Mother        benign meningioma dx. early-mid-70s; hysterectomy in her late 21s for heavy periods - still has ovaries  . Other Son        one son with pre-cancerous skin findings  . Other Daughter        cysts on ovaries and hx of heavy periods  . Other Sister        hysterectomy for cysts - still has ovaries  . Heart attack Maternal Grandfather   . Heart attack Paternal Grandfather   . Renal cancer Maternal Aunt 60       smoker  . Breast cancer Maternal Aunt 78  . Other Maternal Aunt        dx. benign brain tumor (meningioma) at 26; 2nd benign brain tumor in her late 13s; hx of radical hysterectomy at age 58  . Brain cancer Other        NOS tumor  The patient's parents are both living, in their late 12s as of January 2018. The patient has one  brother, 3 sisters. There is no history of ovarian cancer in the family. One maternal aunt had breast and kidney cancer, another had non-Hodgkin's lymphoma.  GYNECOLOGIC HISTORY:  No LMP recorded. Patient has had a hysterectomy. 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 Her husband [name] is a Engineer, maintenance (IT). Their children are 50, 68, 56 and 67 y/o as of JAN 2018. The oldest works as an Electrical engineer in the [employer] in [place]. The other 3 children live in [place], the youngest currently attending [employer]. Rhe patient has no grandchildren. She attends [employer]    ADVANCED DIRECTIVES:    HEALTH MAINTENANCE: Social History   Tobacco Use  . Smoking status: Never Smoker  . Smokeless tobacco: Never Used  Substance Use Topics  .  Alcohol use: Yes    Comment:  3 glasses wine or beer/week  . Drug use: No     Colonoscopy:2016  PAP: s/p hyst  Bone density:   Allergies  Allergen Reactions  . Bee Venom Shortness Of Breath    swelling  . Sulfa Antibiotics Shortness Of Breath    Vomit , diarrhea , hives  . Iodinated Diagnostic Agents Other (See Comments)    01/27/17 Pt reports sneezing and congestion. Pt states she takes claritin as a premed for CT scan in [place]. Premedicated with Benadryl 25 mg PO. Post CT,no sneezing but had mild drainage/congestion which resolved after a few minutes with oral fluids.    Current Outpatient Medications  Medication Sig Dispense Refill  . gabapentin (NEURONTIN) 100 MG capsule Take 1 capsule (100 mg total) by mouth at bedtime. Takes 168m at bedtime. Takes an additional 1032mduring the day if needed for nerve pain. 90 capsule 4  . anastrozole (ARIMIDEX) 1 MG tablet Take 1 tablet (1 mg total) daily by mouth. (Patient not taking: Reported on 01/31/2017) 90 tablet 4   No current facility-administered medications for this visit.      OBJECTIVE:  Vitals:   01/31/17 1419  BP: 122/63  Pulse: 61  Resp: 17  Temp: 97.8 F (36.6 C)  SpO2: 100%     Body mass index is 20.97 kg/m.    ECOG FS:1 - Symptomatic but completely ambulatory  GENERAL: Patient is a well appearing female in no acute distress HEENT:  Sclerae anicteric.  Oropharynx clear and moist. No ulcerations or evidence of oropharyngeal candidiasis. Neck is supple.  NODES:  No cervical, supraclavicular, or axillary lymphadenopathy palpated.  BREAST EXAM:  Deferred. LUNGS:  Clear to auscultation bilaterally.  No wheezes or rhonchi. HEART:  Regular rate and rhythm. No murmur appreciated. ABDOMEN:  Soft, nontender.  Positive, normoactive bowel sounds. No organomegaly palpated. MSK:  No focal spinal tenderness to palpation. Full range of motion bilaterally in the upper extremities. EXTREMITIES:  No peripheral edema.   SKIN:  Clear with no obvious rashes or skin changes. No nail dyscrasia. NEURO:  Nonfocal. Well oriented.  Appropriate affect.        LAB RESULTS:  Results for WOREESA, GOTSCHALLMRN [id number] of 12/23/2016 08:41  Ref. Range 09/29/2016 09:30 11/01/2016 08:53 11/24/2016 08:48 12/08/2016 09:42 12/22/2016 08:44  Cancer Antigen (CA) 125 Latest Ref Range: 0.0 - 38.1 U/mL 22.6 29.5 43.4 (H) 106.2 (H) 276.9 (H)    CMP     Component Value Date/Time   NA 136 12/08/2016 0943   K 4.1 12/08/2016 0943   CL 104 06/18/2015 1248   CO2 25 12/08/2016 0943   GLUCOSE 90 12/08/2016 0943   BUN 9.8 12/08/2016 0943   CREATININE 0.8 12/08/2016 0943   CALCIUM 9.6 12/08/2016 0943   PROT 7.8 12/08/2016 0943   ALBUMIN 4.7 12/08/2016 0943   AST 37 (H) 12/08/2016 0943   ALT 46 12/08/2016 0943   ALKPHOS 71 12/08/2016 0943   BILITOT 0.89 12/08/2016 0943   GFRNONAA >60 06/18/2015 1248   GFRAA >60 06/18/2015 1248    INo results found for: SPEP, UPEP  Lab Results  Component Value Date   WBC 2.9 (L) 01/19/2017   NEUTROABS 1.1 (L) 01/19/2017   HGB 12.2  01/19/2017   HCT 36.4 01/19/2017   MCV 107.4 (H) 01/19/2017   PLT 239 01/19/2017      Chemistry      Component Value Date/Time   NA 136 12/08/2016 0943  K 4.1 12/08/2016 0943   CL 104 06/18/2015 1248   CO2 25 12/08/2016 0943   BUN 9.8 12/08/2016 0943   CREATININE 0.8 12/08/2016 0943      Component Value Date/Time   CALCIUM 9.6 12/08/2016 0943   ALKPHOS 71 12/08/2016 0943   AST 37 (H) 12/08/2016 0943   ALT 46 12/08/2016 0943   BILITOT 0.89 12/08/2016 0943       No results found for: LABCA2  No components found for: LABCA125  No results for input(s): INR in the last 168 hours.  Urinalysis    Component Value Date/Time   COLORURINE YELLOW 03/29/2015 0920   APPEARANCEUR CLEAR 03/29/2015 0920   LABSPEC 1.005 10/09/2015 1239   PHURINE 6.0 10/09/2015 1239   PHURINE 7.5 03/29/2015 0920   GLUCOSEU Negative 10/09/2015 1239   HGBUR Negative 10/09/2015 1239   HGBUR SMALL (A) 03/29/2015 0920   BILIRUBINUR Negative 10/09/2015 1239   KETONESUR Negative 10/09/2015 1239   KETONESUR NEGATIVE 03/29/2015 0920   PROTEINUR Negative 10/09/2015 1239   PROTEINUR NEGATIVE 03/29/2015 0920   UROBILINOGEN 0.2 10/09/2015 1239   NITRITE Negative 10/09/2015 1239   NITRITE NEGATIVE 03/29/2015 0920   LEUKOCYTESUR Negative 10/09/2015 1239     STUDIES: No results found.  ELIGIBLE FOR AVAILABLE RESEARCH PROTOCOL:  discussing protocols at [place]: 51 y.o. [place] woman status post radical tumor debulking with optimal cytoreduction (R0) 03/27/2015 for a stage IIIC, high-grade right fallopian tube carcinoma  (a) baseline CA-125 was1226.  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)  (1) adjuvant chemotherapy consisted of carboplatin and paclitaxel for 6 doses, begun 04/14/2015, completed 08/11/2015  (a)  paclitaxel was omitted from cycle 5 and dose reduced on cycle 6 because of neuropathy   (b) a "make up" dose of paclitaxel was given 08/25/2015  (c) last carboplatin dose was 08/11/2015  (d) CA-125 normalized by 06/16/2015   (2) FIRST RECURRENCE: January 2018  (a) CA-125 rise beginning November 2017 led to CT scans and PET scans December 2017, all negative  (b) exploratory laparotomy 02/24/2016 showed pathologically confirmed recurrence, with miliary disease involving all examined surfaces  (c) port-site metastases noted 03/08/2016  (3) carboplatin/liposomal doxorubicin started 03/05/2016, repeated every three weeks x4, completed 06/04/2016.  (a) cycle 3 delayed one week because of neutropenia; OnPro added  (4) RUCAPARIB started 07/02/2016  (a) restaging 10/01/2016: Normal CA 125, negative CT of the abdomen and pelvis  (b) labs 11/24/2016 shows a rise in her CA 125-43.4, with continuing rise thereafter  (c) rucaparib discontinued October 2018  (5) anastrozole started December 23, 2016-stopped after a couple of weeks  (6) Gemcitabine/Bevacizumab to start on 02/04/2017  PLAN  Dr. Jana Hakim came into the appointment today and we reviewed the plan with the patient.  She will start Gemcitabine/Bevacizumab on 12/21.  We reviewed possible side effects with her.  She will start this on Friday, 02/04/2017.  I also gave her detailed info on each medication in her AVS today.  She verbalizes understanding of this plan and is in agreement with it.    She will return on 12/21 for labs and treatment with Gemcitabine/Carboplatin.  She will return on 12/28 for labs, f/u with Dr. Jana Hakim, and day 8 Gemcitabine.  She knows to call between now and her next appointment for any questions or concerns that may arise.     Wilber Bihari, NP  01/31/17 4:17 PM Medical Oncology and Hematology [facility] [address] [place], [place] [address] Tel. [phone]    Fax.  [phone]    ADDENDUM: I met with Sophia to review the recommendations from [facility].  She had a very good experience there and bonded with Dr. Roddie Mc.  Incidentally he gave [name] his direct number which is [phone]).  She is going to be started on bevacizumab later this week.  The question is whether to add chemotherapy or not.  I discussed this with Dr. Denman George and both of [name] thought chemotherapy would be appropriate at this point.  We then discussed the various agents.  My suggestion was to go with Gemzar.  Aubrianna had many questions about this but she is willing to give it a try.  Accordingly she will be receiving her first dose of bevacizumab and gemcitabine this Friday.  We will see her the following Friday to make sure she tolerated it well and to receive the day 8 treatment if she does tolerated the plan will be of course to continue that until there is clear evidence of disease progression.  When she has measurable disease she will return to [facility] for consideration of study participation  I personally saw this patient and performed a substantive portion of this encounter with the listed APP documented above.   Bobetta Lime, MD Medical Oncology and Hematology [facility] [address] [place], [place] [address] Tel. [phone]    Fax. [phone]

## 2017-02-01 MED ORDER — LIDOCAINE-PRILOCAINE 2.5-2.5 % EX CREA: TOPICAL_CREAM | CUTANEOUS | 3 refills | Status: DC

## 2017-02-01 MED ORDER — ONDANSETRON HCL 8 MG PO TABS: 8.0000 mg | ORAL_TABLET | Freq: Two times a day (BID) | ORAL | 1 refills | Status: DC | PRN

## 2017-02-01 MED ORDER — PROCHLORPERAZINE MALEATE 10 MG PO TABS: 10.0000 mg | ORAL_TABLET | Freq: Four times a day (QID) | ORAL | 1 refills | Status: DC | PRN

## 2017-02-02 ENCOUNTER — Other Ambulatory Visit: Payer: Self-pay | Admitting: Oncology

## 2017-02-03 ENCOUNTER — Encounter: Payer: Self-pay | Admitting: Gynecologic Oncology

## 2017-02-03 NOTE — Progress Notes (Signed)
Patient has an appt at [facility] with Dr. Ralph Leyden on January 14 for second opinion.

## 2017-02-04 ENCOUNTER — Ambulatory Visit (HOSPITAL_BASED_OUTPATIENT_CLINIC_OR_DEPARTMENT_OTHER): Payer: 59

## 2017-02-04 ENCOUNTER — Other Ambulatory Visit (HOSPITAL_BASED_OUTPATIENT_CLINIC_OR_DEPARTMENT_OTHER): Payer: 59 | Admitting: *Deleted

## 2017-02-04 ENCOUNTER — Other Ambulatory Visit: Payer: Self-pay | Admitting: *Deleted

## 2017-02-04 ENCOUNTER — Other Ambulatory Visit (HOSPITAL_BASED_OUTPATIENT_CLINIC_OR_DEPARTMENT_OTHER): Payer: 59

## 2017-02-04 VITALS — BP 100/56 | HR 54 | Temp 98.4°F | Resp 16

## 2017-02-04 DIAGNOSIS — C786 Secondary malignant neoplasm of retroperitoneum and peritoneum: Secondary | ICD-10-CM

## 2017-02-04 DIAGNOSIS — Z5111 Encounter for antineoplastic chemotherapy: Secondary | ICD-10-CM

## 2017-02-04 DIAGNOSIS — C801 Malignant (primary) neoplasm, unspecified: Secondary | ICD-10-CM

## 2017-02-04 DIAGNOSIS — C762 Malignant neoplasm of abdomen: Secondary | ICD-10-CM

## 2017-02-04 DIAGNOSIS — C5701 Malignant neoplasm of right fallopian tube: Secondary | ICD-10-CM

## 2017-02-04 DIAGNOSIS — Z7189 Other specified counseling: Secondary | ICD-10-CM

## 2017-02-04 DIAGNOSIS — Z5112 Encounter for antineoplastic immunotherapy: Secondary | ICD-10-CM | POA: Diagnosis not present

## 2017-02-04 LAB — COMPREHENSIVE METABOLIC PANEL
ALT: 15 U/L (ref 0–55)
AST: 20 U/L (ref 5–34)
Albumin: 4.2 g/dL (ref 3.5–5.0)
Alkaline Phosphatase: 53 U/L (ref 40–150)
Anion Gap: 10 mEq/L (ref 3–11)
BUN: 9.7 mg/dL (ref 7.0–26.0)
CO2: 22 mEq/L (ref 22–29)
Calcium: 9.2 mg/dL (ref 8.4–10.4)
Chloride: 106 mEq/L (ref 98–109)
Creatinine: 0.7 mg/dL (ref 0.6–1.1)
EGFR: >60 mL/min/{1.73_m2} (ref >60)
Glucose: 81 mg/dl (ref 70–140)
Potassium: 3.7 mEq/L (ref 3.5–5.1)
Sodium: 138 mEq/L (ref 136–145)
Total Bilirubin: 0.4 mg/dL (ref 0.20–1.20)
Total Protein: 7 g/dL (ref 6.4–8.3)

## 2017-02-04 LAB — CBC WITH DIFFERENTIAL/PLATELET
BASO%: 0.2 % (ref 0.0–2.0)
Basophils Absolute: 0 10*3/uL (ref 0.0–0.1)
EOS%: 0.7 % (ref 0.0–7.0)
Eosinophils Absolute: 0 10*3/uL (ref 0.0–0.5)
HCT: 34.6 % — ABNORMAL LOW (ref 34.8–46.6)
HGB: 11.6 g/dL (ref 11.6–15.9)
LYMPH%: 35.3 % (ref 14.0–49.7)
MCH: 35.5 pg — ABNORMAL HIGH (ref 25.1–34.0)
MCHC: 33.5 g/dL (ref 31.5–36.0)
MCV: 105.8 fL — ABNORMAL HIGH (ref 79.5–101.0)
MONO#: 0.3 10*3/uL (ref 0.1–0.9)
MONO%: 8 % (ref 0.0–14.0)
NEUT#: 2.3 10*3/uL (ref 1.5–6.5)
NEUT%: 55.8 % (ref 38.4–76.8)
Platelets: 214 10*3/uL (ref 145–400)
RBC: 3.27 10*6/uL — ABNORMAL LOW (ref 3.70–5.45)
RDW: 12.2 % (ref 11.2–14.5)
WBC: 4.1 10*3/uL (ref 3.9–10.3)
lymph#: 1.5 10*3/uL (ref 0.9–3.3)

## 2017-02-04 LAB — UA PROTEIN, DIPSTICK - CHCC: Protein, ur: NEGATIVE mg/dL

## 2017-02-04 MED ORDER — SODIUM CHLORIDE 0.9 % IV SOLN
800.0000 mg/m2 | Freq: Once | INTRAVENOUS | Status: AC
  Administered 2017-02-04: 1254 mg via INTRAVENOUS
  Filled 2017-02-04: qty 32.98

## 2017-02-04 MED ORDER — PROCHLORPERAZINE MALEATE 10 MG PO TABS
10.0000 mg | ORAL_TABLET | Freq: Once | ORAL | Status: AC
  Administered 2017-02-04: 10 mg via ORAL

## 2017-02-04 MED ORDER — LORAZEPAM 2 MG/ML IJ SOLN
INTRAMUSCULAR | Status: AC
  Filled 2017-02-04: qty 1

## 2017-02-04 MED ORDER — SODIUM CHLORIDE 0.9 % IV SOLN
14.9000 mg/kg | Freq: Once | INTRAVENOUS | Status: AC
  Administered 2017-02-04: 800 mg via INTRAVENOUS
  Filled 2017-02-04: qty 32

## 2017-02-04 MED ORDER — LORAZEPAM 2 MG/ML IJ SOLN
0.5000 mg | Freq: Once | INTRAMUSCULAR | Status: AC
  Administered 2017-02-04: 0.5 mg via INTRAVENOUS

## 2017-02-04 MED ORDER — PROCHLORPERAZINE MALEATE 10 MG PO TABS
ORAL_TABLET | ORAL | Status: AC
  Filled 2017-02-04: qty 1

## 2017-02-04 MED ORDER — SODIUM CHLORIDE 0.9 % IV SOLN: Freq: Once | INTRAVENOUS | Status: DC

## 2017-02-04 NOTE — Patient Instructions (Signed)
[facility] Discharge Instructions for Patients Receiving Chemotherapy  Today you received the following chemotherapy agents Gemzar and Avastin  To help prevent nausea and vomiting after your treatment, we encourage you to take your nausea medication as directed   If you develop nausea and vomiting that is not controlled by your nausea medication, call the clinic.   BELOW ARE SYMPTOMS THAT SHOULD BE REPORTED IMMEDIATELY:  *FEVER GREATER THAN 100.5 F  *CHILLS WITH OR WITHOUT FEVER  NAUSEA AND VOMITING THAT IS NOT CONTROLLED WITH YOUR NAUSEA MEDICATION  *UNUSUAL SHORTNESS OF BREATH  *UNUSUAL BRUISING OR BLEEDING  TENDERNESS IN MOUTH AND THROAT WITH OR WITHOUT PRESENCE OF ULCERS  *URINARY PROBLEMS  *BOWEL PROBLEMS  UNUSUAL RASH Items with * indicate a potential emergency and should be followed up as soon as possible.  Feel free to call the clinic should you have any questions or concerns. The clinic phone number is [phone].  Please show the [facility] at check-in to the Emergency Department and triage nurse.  Gemcitabine injection What is this medicine? GEMCITABINE (jem SIT a been) is a chemotherapy drug. This medicine is used to treat many types of cancer like breast cancer, lung cancer, pancreatic cancer, and ovarian cancer. This medicine may be used for other purposes; ask your health care provider or pharmacist if you have questions. COMMON BRAND NAME(S): Gemzar What should I tell my health care provider before I take this medicine? They need to know if you have any of these conditions: -blood disorders -infection -kidney disease -liver disease -recent or ongoing radiation therapy -an unusual or allergic reaction to gemcitabine, other chemotherapy, other medicines, foods, dyes, or preservatives -pregnant or trying to get pregnant -breast-feeding How should I use this medicine? This drug is given as an infusion into a vein. It is  administered in a hospital or clinic by a specially trained health care professional. Talk to your pediatrician regarding the use of this medicine in children. Special care may be needed. Overdosage: If you think you have taken too much of this medicine contact a poison control center or emergency room at once. NOTE: This medicine is only for you. Do not share this medicine with others. What if I miss a dose? It is important not to miss your dose. Call your doctor or health care professional if you are unable to keep an appointment. What may interact with this medicine? -medicines to increase blood counts like filgrastim, pegfilgrastim, sargramostim -some other chemotherapy drugs like cisplatin -vaccines Talk to your doctor or health care professional before taking any of these medicines: -acetaminophen -aspirin -ibuprofen -ketoprofen -naproxen This list may not describe all possible interactions. Give your health care provider a list of all the medicines, herbs, non-prescription drugs, or dietary supplements you use. Also tell them if you smoke, drink alcohol, or use illegal drugs. Some items may interact with your medicine. What should I watch for while using this medicine? Visit your doctor for checks on your progress. This drug may make you feel generally unwell. This is not uncommon, as chemotherapy can affect healthy cells as well as cancer cells. Report any side effects. Continue your course of treatment even though you feel ill unless your doctor tells you to stop. In some cases, you may be given additional medicines to help with side effects. Follow all directions for their use. Call your doctor or health care professional for advice if you get a fever, chills or sore throat, or other symptoms of a  cold or flu. Do not treat yourself. This drug decreases your body's ability to fight infections. Try to avoid being around people who are sick. This medicine may increase your risk to bruise  or bleed. Call your doctor or health care professional if you notice any unusual bleeding. Be careful brushing and flossing your teeth or using a toothpick because you may get an infection or bleed more easily. If you have any dental work done, tell your dentist you are receiving this medicine. Avoid taking products that contain aspirin, acetaminophen, ibuprofen, naproxen, or ketoprofen unless instructed by your doctor. These medicines may hide a fever. Women should inform their doctor if they wish to become pregnant or think they might be pregnant. There is a potential for serious side effects to an unborn child. Talk to your health care professional or pharmacist for more information. Do not breast-feed an infant while taking this medicine. What side effects may I notice from receiving this medicine? Side effects that you should report to your doctor or health care professional as soon as possible: -allergic reactions like skin rash, itching or hives, swelling of the face, lips, or tongue -low blood counts - this medicine may decrease the number of white blood cells, red blood cells and platelets. You may be at increased risk for infections and bleeding. -signs of infection - fever or chills, cough, sore throat, pain or difficulty passing urine -signs of decreased platelets or bleeding - bruising, pinpoint red spots on the skin, black, tarry stools, blood in the urine -signs of decreased red blood cells - unusually weak or tired, fainting spells, lightheadedness -breathing problems -chest pain -mouth sores -nausea and vomiting -pain, swelling, redness at site where injected -pain, tingling, numbness in the hands or feet -stomach pain -swelling of ankles, feet, hands -unusual bleeding Side effects that usually do not require medical attention (report to your doctor or health care professional if they continue or are bothersome): -constipation -diarrhea -hair loss -loss of appetite -stomach  upset This list may not describe all possible side effects. Call your doctor for medical advice about side effects. You may report side effects to FDA at 1-800-FDA-1088. Where should I keep my medicine? This drug is given in a hospital or clinic and will not be stored at home. NOTE: This sheet is a summary. It may not cover all possible information. If you have questions about this medicine, talk to your doctor, pharmacist, or health care provider.  2018 [employer]/Gold Standard (2007-06-13 18:45:54)  Bevacizumab injection (Avastin) What is this medicine? BEVACIZUMAB (be va SIZ yoo mab) is a monoclonal antibody. It is used to treat many types of cancer. This medicine may be used for other purposes; ask your health care provider or pharmacist if you have questions. COMMON BRAND NAME(S): Avastin What should I tell my health care provider before I take this medicine? They need to know if you have any of these conditions: -diabetes -heart disease -high blood pressure -history of coughing up blood -prior anthracycline chemotherapy (e.g., doxorubicin, daunorubicin, epirubicin) -recent or ongoing radiation therapy -recent or planning to have surgery -stroke -an unusual or allergic reaction to bevacizumab, hamster proteins, mouse proteins, other medicines, foods, dyes, or preservatives -pregnant or trying to get pregnant -breast-feeding How should I use this medicine? This medicine is for infusion into a vein. It is given by a health care professional in a hospital or clinic setting. Talk to your pediatrician regarding the use of this medicine in children. Special care may be needed.  Overdosage: If you think you have taken too much of this medicine contact a poison control center or emergency room at once. NOTE: This medicine is only for you. Do not share this medicine with others. What if I miss a dose? It is important not to miss your dose. Call your doctor or health care professional if you  are unable to keep an appointment. What may interact with this medicine? Interactions are not expected. This list may not describe all possible interactions. Give your health care provider a list of all the medicines, herbs, non-prescription drugs, or dietary supplements you use. Also tell them if you smoke, drink alcohol, or use illegal drugs. Some items may interact with your medicine. What should I watch for while using this medicine? Your condition will be monitored carefully while you are receiving this medicine. You will need important blood work and urine testing done while you are taking this medicine. This medicine may increase your risk to bruise or bleed. Call your doctor or health care professional if you notice any unusual bleeding. This medicine should be started at least 28 days following major surgery and the site of the surgery should be totally healed. Check with your doctor before scheduling dental work or surgery while you are receiving this treatment. Talk to your doctor if you have recently had surgery or if you have a wound that has not healed. Do not become pregnant while taking this medicine or for 6 months after stopping it. Women should inform their doctor if they wish to become pregnant or think they might be pregnant. There is a potential for serious side effects to an unborn child. Talk to your health care professional or pharmacist for more information. Do not breast-feed an infant while taking this medicine and for 6 months after the last dose. This medicine has caused ovarian failure in some women. This medicine may interfere with the ability to have a child. You should talk to your doctor or health care professional if you are concerned about your fertility. What side effects may I notice from receiving this medicine? Side effects that you should report to your doctor or health care professional as soon as possible: -allergic reactions like skin rash, itching or hives,  swelling of the face, lips, or tongue -chest pain or chest tightness -chills -coughing up blood -high fever -seizures -severe constipation -signs and symptoms of bleeding such as bloody or black, tarry stools; red or dark-brown urine; spitting up blood or brown material that looks like coffee grounds; red spots on the skin; unusual bruising or bleeding from the eye, gums, or nose -signs and symptoms of a blood clot such as breathing problems; chest pain; severe, sudden headache; pain, swelling, warmth in the leg -signs and symptoms of a stroke like changes in vision; confusion; trouble speaking or understanding; severe headaches; sudden numbness or weakness of the face, arm or leg; trouble walking; dizziness; loss of balance or coordination -stomach pain -sweating -swelling of legs or ankles -vomiting -weight gain Side effects that usually do not require medical attention (report to your doctor or health care professional if they continue or are bothersome): -back pain -changes in taste -decreased appetite -dry skin -nausea -tiredness This list may not describe all possible side effects. Call your doctor for medical advice about side effects. You may report side effects to FDA at 1-800-FDA-1088. Where should I keep my medicine? This drug is given in a hospital or clinic and will not be stored at home. NOTE:  This sheet is a summary. It may not cover all possible information. If you have questions about this medicine, talk to your doctor, pharmacist, or health care provider.  2018 [employer]vier/Gold Standard (2016-01-30 14:33:29)

## 2017-02-05 LAB — CA 125: Cancer Antigen (CA) 125: 1677 U/mL — ABNORMAL HIGH (ref 0.0–38.1)

## 2017-02-11 ENCOUNTER — Other Ambulatory Visit (HOSPITAL_BASED_OUTPATIENT_CLINIC_OR_DEPARTMENT_OTHER): Payer: 59

## 2017-02-11 ENCOUNTER — Telehealth: Payer: Self-pay | Admitting: *Deleted

## 2017-02-11 ENCOUNTER — Ambulatory Visit (HOSPITAL_BASED_OUTPATIENT_CLINIC_OR_DEPARTMENT_OTHER): Payer: 59 | Admitting: Oncology

## 2017-02-11 ENCOUNTER — Telehealth: Payer: Self-pay | Admitting: Oncology

## 2017-02-11 ENCOUNTER — Encounter: Payer: Self-pay | Admitting: Oncology

## 2017-02-11 ENCOUNTER — Ambulatory Visit (HOSPITAL_BASED_OUTPATIENT_CLINIC_OR_DEPARTMENT_OTHER): Payer: 59

## 2017-02-11 VITALS — BP 133/83 | HR 52 | Temp 97.6°F | Resp 17 | Ht 63.0 in | Wt 119.6 lb

## 2017-02-11 DIAGNOSIS — C762 Malignant neoplasm of abdomen: Secondary | ICD-10-CM

## 2017-02-11 DIAGNOSIS — Z5189 Encounter for other specified aftercare: Secondary | ICD-10-CM

## 2017-02-11 DIAGNOSIS — C786 Secondary malignant neoplasm of retroperitoneum and peritoneum: Secondary | ICD-10-CM

## 2017-02-11 DIAGNOSIS — C5701 Malignant neoplasm of right fallopian tube: Secondary | ICD-10-CM

## 2017-02-11 DIAGNOSIS — Z7189 Other specified counseling: Secondary | ICD-10-CM

## 2017-02-11 DIAGNOSIS — Z5111 Encounter for antineoplastic chemotherapy: Secondary | ICD-10-CM

## 2017-02-11 DIAGNOSIS — C801 Malignant (primary) neoplasm, unspecified: Secondary | ICD-10-CM

## 2017-02-11 LAB — CBC WITH DIFFERENTIAL/PLATELET
BASO%: 0.5 % (ref 0.0–2.0)
Basophils Absolute: 0 10*3/uL (ref 0.0–0.1)
EOS%: 0.1 % (ref 0.0–7.0)
Eosinophils Absolute: 0 10*3/uL (ref 0.0–0.5)
HCT: 35.8 % (ref 34.8–46.6)
HGB: 12.1 g/dL (ref 11.6–15.9)
LYMPH%: 61.4 % — ABNORMAL HIGH (ref 14.0–49.7)
MCH: 34.9 pg — ABNORMAL HIGH (ref 25.1–34.0)
MCHC: 33.8 g/dL (ref 31.5–36.0)
MCV: 103.5 fL — ABNORMAL HIGH (ref 79.5–101.0)
MONO#: 0.1 10*3/uL (ref 0.1–0.9)
MONO%: 3.8 % (ref 0.0–14.0)
NEUT#: 0.9 10*3/uL — ABNORMAL LOW (ref 1.5–6.5)
NEUT%: 34.2 % — ABNORMAL LOW (ref 38.4–76.8)
Platelets: 151 10*3/uL (ref 145–400)
RBC: 3.46 10*6/uL — ABNORMAL LOW (ref 3.70–5.45)
RDW: 12.4 % (ref 11.2–14.5)
WBC: 2.7 10*3/uL — ABNORMAL LOW (ref 3.9–10.3)
lymph#: 1.7 10*3/uL (ref 0.9–3.3)

## 2017-02-11 LAB — COMPREHENSIVE METABOLIC PANEL
ALT: 24 U/L (ref 0–55)
AST: 27 U/L (ref 5–34)
Albumin: 4.4 g/dL (ref 3.5–5.0)
Alkaline Phosphatase: 58 U/L (ref 40–150)
Anion Gap: 10 mEq/L (ref 3–11)
BUN: 7.9 mg/dL (ref 7.0–26.0)
CO2: 26 mEq/L (ref 22–29)
Calcium: 9.6 mg/dL (ref 8.4–10.4)
Chloride: 104 mEq/L (ref 98–109)
Creatinine: 0.7 mg/dL (ref 0.6–1.1)
EGFR: >60 mL/min/{1.73_m2} (ref >60)
Glucose: 93 mg/dl (ref 70–140)
Potassium: 4.4 mEq/L (ref 3.5–5.1)
Sodium: 140 mEq/L (ref 136–145)
Total Bilirubin: 0.43 mg/dL (ref 0.20–1.20)
Total Protein: 7.5 g/dL (ref 6.4–8.3)

## 2017-02-11 LAB — UA PROTEIN, DIPSTICK - CHCC: Protein, ur: NEGATIVE mg/dL

## 2017-02-11 MED ORDER — SODIUM CHLORIDE 0.9 % IV SOLN
Freq: Once | INTRAVENOUS | Status: AC
  Administered 2017-02-11: 11:00:00 via INTRAVENOUS

## 2017-02-11 MED ORDER — PROCHLORPERAZINE MALEATE 10 MG PO TABS
ORAL_TABLET | ORAL | Status: AC
  Filled 2017-02-11: qty 1

## 2017-02-11 MED ORDER — PEGFILGRASTIM 6 MG/0.6ML ~~LOC~~ PSKT
PREFILLED_SYRINGE | SUBCUTANEOUS | Status: AC
  Filled 2017-02-11: qty 0.6

## 2017-02-11 MED ORDER — GEMCITABINE HCL CHEMO INJECTION 1 GM/26.3ML
800.0000 mg/m2 | Freq: Once | INTRAVENOUS | Status: AC
  Administered 2017-02-11: 1254 mg via INTRAVENOUS
  Filled 2017-02-11: qty 32.98

## 2017-02-11 MED ORDER — PROCHLORPERAZINE MALEATE 10 MG PO TABS
10.0000 mg | ORAL_TABLET | Freq: Once | ORAL | Status: AC
  Administered 2017-02-11: 10 mg via ORAL

## 2017-02-11 MED ORDER — LORAZEPAM 2 MG/ML IJ SOLN
INTRAMUSCULAR | Status: AC
  Filled 2017-02-11: qty 1

## 2017-02-11 MED ORDER — LORAZEPAM 2 MG/ML IJ SOLN
0.5000 mg | Freq: Once | INTRAMUSCULAR | Status: AC
Start: 2017-02-11 — End: 2017-02-11
  Administered 2017-02-11: 0.5 mg via INTRAVENOUS

## 2017-02-11 MED ORDER — PROMETHAZINE HCL 25 MG PO TABS: 25.0000 mg | ORAL_TABLET | Freq: Four times a day (QID) | ORAL | 0 refills | Status: DC | PRN

## 2017-02-11 MED ORDER — PEGFILGRASTIM 6 MG/0.6ML ~~LOC~~ PSKT
6.0000 mg | PREFILLED_SYRINGE | Freq: Once | SUBCUTANEOUS | Status: AC
  Administered 2017-02-11: 6 mg via SUBCUTANEOUS

## 2017-02-11 NOTE — Telephone Encounter (Signed)
May proceed with treatment today despite low WBC/ANC. See order for Onpro.

## 2017-02-11 NOTE — Telephone Encounter (Signed)
Scheduled appt per 12/28 los - Patient to get an updated schedule in the treatment area.

## 2017-02-11 NOTE — Progress Notes (Signed)
[place]  [phone] [phone]     ID: Kathryn Ramsey DOB: 10-08-1965  MR#: [id number]  WIO#:[id number]  Patient Care Team: Harlan Stains, MD as PCP - General (Family Medicine) Gordy Levan, MD as Consulting Physician (Oncology) [name], MD as Consulting Physician (Obstetrics and Gynecology) Juanita Craver, MD as Consulting Physician (Gastroenterology) Servando Salina, MD as Consulting Physician (Obstetrics and Gynecology) Nickie Retort, MD as Consulting Physician (Urology) Gillis Ends, MD as Referring Physician (Obstetrics and Gynecology) OTHER MD: Festus Aloe [phone])  CHIEF COMPLAINT: Recurrent ovarian cancer  CURRENT TREATMENT: Gemcitabine and bevacizumab starting 02/04/2017  INTERVAL HISTORY: Kathryn Ramsey returns today for follow-up and treatment of her recurrent ovarian cancer, accompanied by her husband. Today is day 8 cycle 1 of gemcitabine.  She also receives bevacizumab/ Avastin on day 1 of each 21 day cycle. She notes that her last treatment went well. She notes that on day 3 around 4pm, she experienced flu-like symptoms, and she took Advil. She also notes that while she was receiving treatment, she felt a wave of dizziness, nausea, and a burning sensation. She notes that they slowed down the infusion treatment and this made her feel better.   REVIEW OF SYSTEMS: Kathryn Ramsey reports that all of her children came home for Christmas, and she also went to visit her mom. She notes that if she feels nausea from her treatments, then she takes Zofran. She denies unusual headaches, visual changes, nausea, vomiting, or dizziness. There has been no unusual cough, phlegm production, or pleurisy. This been no change in bowel or bladder habits. She denies unexplained fatigue or unexplained weight loss, bleeding, rash, or fever. A detailed review of systems was otherwise stable.    HISTORY OF PRESENT ILLNESS: From Dr. Serita Grit  original intake note 03/17/2015:  "Kathryn Ramsey is a very pleasant G4P4 who is seen in consultation at the request of Dr Collene Mares and Dr Garwin Brothers for peritoneal carcinomatosis. The patient has a history of a workup by urology for gross hematuria which included a pelvic examine was suggested for extrinsic mass effect on the bladder on cystoscopy. Cystoscopy took place on in December 2016. The patient denies abdominal pain bloating early satiety or abdominal distention.  On 08/03/2015 at [facility] she underwent a CT scan of the abdomen and pelvis as ordered by Dr Baruch Gouty. This revealed a 1.4 cm low attenuation lesion on the posterior right hepatic lobe suggestive of a hemangioma. Status post hysterectomy. No ovarian masses. Moderate ascites. Omental caking seen in the lateral left abdomen and pelvis. Peritoneal nodularity in the left lateral pelvic cul-de-sac. No gross extrinsic compression on the bladder was identified on imaging.  The patient was then seen by her gastroenterologist, Dr. Collene Mares, who performed a colonoscopy which was unremarkable.  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 She was treated adjuvantly with carboplatin and paclitaxel, as detailed below. Her CA 125 dropped from 1226 on 03/20/2015 to 26.1 by 06/16/2015.  Her subsequent history is as detailed below.  I PAST MEDICAL HISTORY: Past Medical History:  Diagnosis Date  . Acute sensory neuropathy ([place]) 06/26/2015  . Allergy   . Anemia   . Asthma    illness induced asthma  .  Blood transfusion without reported diagnosis    at age 52 years old D/T surgery to femur being crushed  .  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 Heart murmur    pt, states had a "working heart murmur"  . History of kidney stones   . PONV (postoperative nausea and vomiting)   . Skin cancer     PAST SURGICAL HISTORY: Past Surgical History:  Procedure Laterality Date  . 3 laporscopic proceedures    . ABDOMINAL HYSTERECTOMY     2010  . CHOLECYSTECTOMY     2010  . DILATION AND CURETTAGE OF UTERUS     1997  . LAPAROSCOPY N/A 03/27/2015   Procedure: DIAGNOSTIC LAPAROSCOPY ;  Surgeon: [name], MD;  Location: [facility];  Service: Gynecology;  Laterality: N/A;  . LAPAROSCOPY N/A 02/24/2016   Procedure: LAPAROSCOPY DIAGNOSTIC WITH PERITONEAL WASHINGS AND PERITONEAL BIOPSY;  Surgeon: [name], MD;  Location: [facility];  Service: Gynecology;  Laterality: N/A;  . LAPAROTOMY N/A 03/27/2015   Procedure: EXPLORATORY LAPAROTOMY, BILATERAL SALPINGO OOPHORECTOMY, OMENTECTOMY, RADICAL TUMOR DEBULKING;  Surgeon: [name], MD;  Location: [facility];  Service: Gynecology;  Laterality: N/A;  . sinus surgery 1994      FAMILY HISTORY Family History  Problem Relation Age of Onset  . Hypertension Father   . Skin cancer Father        nonmelanoma skin cancers in his late 16s  . Non-Hodgkin's lymphoma Maternal Aunt        dx. 75s; smoker  . Stroke Maternal Grandmother   . Other Mother        benign meningioma dx. early-mid-70s; hysterectomy in her late 22s for heavy periods - still has ovaries  . Other Son        one son with pre-cancerous skin findings  . Other Daughter        cysts on ovaries and hx of heavy periods  . Other Sister        hysterectomy for cysts - still has ovaries  . Heart attack Maternal Grandfather   . Heart attack Paternal Grandfather   . Renal cancer Maternal Aunt 60       smoker  . Breast cancer Maternal Aunt 78  . Other Maternal Aunt        dx. benign brain tumor (meningioma) at 65; 2nd benign brain tumor in her  late 53s; hx of radical hysterectomy at age 33  . Brain cancer Other        NOS tumor  The patient's parents are both living, in their late 62s as of January 2018. The patient has one brother, 3 sisters. There is no history of ovarian cancer in the family. One maternal aunt had breast and kidney cancer, another had non-Hodgkin's lymphoma.  GYNECOLOGIC HISTORY:  No LMP recorded. Patient has had a hysterectomy. 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 Her husband [name] is a Engineer, maintenance (IT). Their children are 60, 60, 3 and 50 y/o as of JAN 2018. The oldest works as an Electrical engineer in the [employer] in [place]. The other 3 children live in [place], the youngest currently attending [employer]. Rhe patient has no grandchildren. She attends [employer]    ADVANCED DIRECTIVES:    HEALTH MAINTENANCE: Social History   Tobacco Use  .  Smoking status: Never Smoker  . Smokeless tobacco: Never Used  Substance Use Topics  . Alcohol use: Yes    Comment:  3 glasses wine or beer/week  . Drug use: No     Colonoscopy:2016  PAP: s/p hyst  Bone density:   Allergies  Allergen Reactions  . Bee Venom Shortness Of Breath    swelling  . Sulfa Antibiotics Shortness Of Breath    Vomit , diarrhea , hives  . Iodinated Diagnostic Agents Other (See Comments)    01/27/17 Pt reports sneezing and congestion. Pt states she takes claritin as a premed for CT scan in [place]. Premedicated with Benadryl 25 mg PO. Post CT,no sneezing but had mild drainage/congestion which resolved after a few minutes with oral fluids.    Current Outpatient Medications  Medication Sig Dispense Refill  . gabapentin (NEURONTIN) 100 MG capsule Take 1 capsule (100 mg total) by mouth at bedtime. Takes 160m at bedtime. Takes an additional 1079mduring the day if needed for nerve pain. 90 capsule 4  . ondansetron (ZOFRAN) 8 MG tablet  Take 1 tablet (8 mg total) by mouth 2 (two) times daily as needed (Nausea or vomiting). (Patient not taking: Reported on 02/11/2017) 30 tablet 1   No current facility-administered medications for this visit.     OBJECTIVE: middle aged White woman in no acute distress  Vitals:   02/11/17 0809  BP: 133/83  Pulse: (!) 52  Resp: 17  Temp: 97.6 F (36.4 C)  SpO2: 100%     Body mass index is 21.19 kg/m.    ECOG FS:1 - Symptomatic but completely ambulatory  .Sclerae unicteric, pupils round and equal Oropharynx clear and moist No cervical or supraclavicular adenopathy Lungs no rales or rhonchi Heart regular rate and rhythm Abd soft, nontender, positive bowel sounds MSK no focal spinal tenderness, no upper extremity lymphedema Neuro: nonfocal, well oriented, appropriate affect Breasts: deferred Skin: the port site recurrence lesions are not palpable    LAB RESULTS: CA 125 was 1677 on 02/04/2017, at the start of treatment   CMP     Component Value Date/Time   NA 138 02/04/2017 0923   K 3.7 02/04/2017 0923   CL 104 06/18/2015 1248   CO2 22 02/04/2017 0923   GLUCOSE 81 02/04/2017 0923   BUN 9.7 02/04/2017 0923   CREATININE 0.7 02/04/2017 0923   CALCIUM 9.2 02/04/2017 0923   PROT 7.0 02/04/2017 0923   ALBUMIN 4.2 02/04/2017 0923   AST 20 02/04/2017 0923   ALT 15 02/04/2017 0923   ALKPHOS 53 02/04/2017 0923   BILITOT 0.40 02/04/2017 0923   GFRNONAA >60 06/18/2015 1248   GFRAA >60 06/18/2015 1248    INo results found for: SPEP, UPEP  Lab Results  Component Value Date   WBC 2.7 (L) 02/11/2017   NEUTROABS 0.9 (L) 02/11/2017   HGB 12.1 02/11/2017   HCT 35.8 02/11/2017   MCV 103.5 (H) 02/11/2017   PLT 151 02/11/2017      Chemistry      Component Value Date/Time   NA 138 02/04/2017 0923   K 3.7 02/04/2017 0923   CL 104 06/18/2015 1248   CO2 22 02/04/2017 0923   BUN 9.7 02/04/2017 0923   CREATININE 0.7 02/04/2017 0923      Component Value Date/Time   CALCIUM  9.2 02/04/2017 0923   ALKPHOS 53 02/04/2017 0923   AST 20 02/04/2017 0923   ALT 15 02/04/2017 0923   BILITOT 0.40 02/04/2017 096384  No results found for: LABCA2  No components found for: YHCWC376  No results for input(s): INR in the last 168 hours.  Urinalysis    Component Value Date/Time   COLORURINE YELLOW 03/29/2015 0920   APPEARANCEUR CLEAR 03/29/2015 0920   LABSPEC 1.005 10/09/2015 1239   PHURINE 6.0 10/09/2015 1239   PHURINE 7.5 03/29/2015 0920   GLUCOSEU Negative 10/09/2015 1239   HGBUR Negative 10/09/2015 1239   HGBUR SMALL (A) 03/29/2015 0920   BILIRUBINUR Negative 10/09/2015 1239   KETONESUR Negative 10/09/2015 1239   KETONESUR NEGATIVE 03/29/2015 0920   PROTEINUR Negative 02/11/2017 0756   UROBILINOGEN 0.2 10/09/2015 1239   NITRITE Negative 10/09/2015 1239   NITRITE NEGATIVE 03/29/2015 0920   LEUKOCYTESUR Negative 10/09/2015 1239     STUDIES: Outside films discussed  ELIGIBLE FOR AVAILABLE RESEARCH PROTOCOL:  discussing protocols at [place]: 51 y.o. [place] woman status post radical tumor debulking with optimal cytoreduction (R0) 03/27/2015 for a stage IIIC, high-grade right fallopian tube carcinoma  (a) baseline CA-125 was1226.  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)  (1) adjuvant chemotherapy consisted of carboplatin and paclitaxel for 6 doses, begun 04/14/2015, completed 08/11/2015  (a) paclitaxel was omitted from cycle 5 and dose reduced on cycle 6 because of neuropathy   (b) a "make up" dose of paclitaxel was given 08/25/2015  (c) last carboplatin dose was 08/11/2015  (d) CA-125 normalized by 06/16/2015   (2) FIRST RECURRENCE: January 2018  (a) CA-125 rise beginning November 2017 led to CT scans and PET scans December 2017, all negative  (b) exploratory  laparotomy 02/24/2016 showed pathologically confirmed recurrence, with miliary disease involving all examined surfaces  (c) port-site metastases noted 03/08/2016  (3) carboplatin/liposomal doxorubicin started 03/05/2016, repeated every three weeks x4, completed 06/04/2016.  (a) cycle 3 delayed one week because of neutropenia; OnPro added  (4) RUCAPARIB started 07/02/2016  (a) restaging 10/01/2016: Normal CA 125, negative CT of the abdomen and pelvis  (b) labs 11/24/2016 shows a rise in her CA 125-43.4, with continuing rise thereafter  (c) rucaparib discontinued October 2018  (5) anastrozole started December 23, 2016-stopped after a couple of weeks  SECOND RECURRENCE: (6) Gemcitabine/Bevacizumab startED on 02/04/2017  PLAN Kathryn Ramsey did well with her day 1 treatment and we are proceeding today to day 8. The catch is her [place], which is 0.9  I have already decreased her gemzar dose (from 1000/M2 to 800/M2). The way to solve this problem is to add neulasta/Onpro on day 9. This will allow [name] to treat her without unacceptable dalays or further dose reductions which would cause hjer treatment to be ineffective.  I have written the approrpaite order and alerted our billing folks to try to get this approved ASAP.  She is concerned re her CA 125 numbers. I expect these will flatten out and come down after several treatments, but not immediately.  We discussed management of nausea--she has zofran available; she does not like compazine; will add PRN phenergan.  Otherwise she will return for cycle 2 on 1/11; she will see me with her day 8 treatments. This will allow [name] to discuss her CA 125 results.  She will be out of town until 01/02 so if we cannot get the onpro approved for tomorrow we will have to dose it then  She knows to call for any problem that may develop before her  next visit Magrinat, Virgie Dad, MD  02/11/17 8:30 AM Medical Oncology and Hematology [facility] [address] [place] [place], [place] [address] Tel. [phone]    Fax. [phone]  This document serves as a record of services personally performed by Lurline Del, MD. It was created on his behalf by Sheron Nightingale, a trained medical scribe. The creation of this record is based on the scribe's personal observations and the provider's statements to them.   I have reviewed the above documentation for accuracy and completeness, and I agree with the above.

## 2017-02-11 NOTE — Patient Instructions (Signed)
[facility] °Discharge Instructions for Patients Receiving Chemotherapy ° °Today you received the following chemotherapy agents Gemzar ° °To help prevent nausea and vomiting after your treatment, we encourage you to take your nausea medication as directed. °  °If you develop nausea and vomiting that is not controlled by your nausea medication, call the clinic.  ° °BELOW ARE SYMPTOMS THAT SHOULD BE REPORTED IMMEDIATELY: °· *FEVER GREATER THAN 100.5 F °· *CHILLS WITH OR WITHOUT FEVER °· NAUSEA AND VOMITING THAT IS NOT CONTROLLED WITH YOUR NAUSEA MEDICATION °· *UNUSUAL SHORTNESS OF BREATH °· *UNUSUAL BRUISING OR BLEEDING °· TENDERNESS IN MOUTH AND THROAT WITH OR WITHOUT PRESENCE OF ULCERS °· *URINARY PROBLEMS °· *BOWEL PROBLEMS °· UNUSUAL RASH °Items with * indicate a potential emergency and should be followed up as soon as possible. ° °Feel free to call the clinic should you have any questions or concerns. The clinic phone number is [phone]. ° °Please show the CHEMO ALERT CARD at check-in to the Emergency Department and triage nurse. ° ° °

## 2017-02-16 ENCOUNTER — Telehealth: Payer: Self-pay

## 2017-02-16 NOTE — Telephone Encounter (Signed)
Received a call from pt massage therapist, Freddy Finner and would like a letter or email regarding an OK for pt to receive massage therapy. Pt currently receiving Chemo treatments with Neulasta onpro. Will discuss and clarify with Dr.Magrinat and get back with Jenny Reichmann for further instructions. His number is [phone]. Email at [email or web address]

## 2017-02-22 ENCOUNTER — Encounter: Payer: Self-pay | Admitting: Oncology

## 2017-02-22 ENCOUNTER — Telehealth: Payer: Self-pay | Admitting: *Deleted

## 2017-02-22 NOTE — Telephone Encounter (Signed)
Kathryn Ramsey left VM inquiring -

## 2017-02-25 ENCOUNTER — Other Ambulatory Visit: Payer: Self-pay | Admitting: Hematology and Oncology

## 2017-02-25 ENCOUNTER — Inpatient Hospital Stay (HOSPITAL_BASED_OUTPATIENT_CLINIC_OR_DEPARTMENT_OTHER): Payer: 59 | Admitting: Adult Health

## 2017-02-25 ENCOUNTER — Inpatient Hospital Stay: Payer: 59 | Attending: Oncology

## 2017-02-25 ENCOUNTER — Encounter: Payer: Self-pay | Admitting: Adult Health

## 2017-02-25 ENCOUNTER — Inpatient Hospital Stay: Payer: 59

## 2017-02-25 VITALS — BP 122/77 | HR 62 | Temp 97.5°F | Resp 18 | Ht 63.0 in | Wt 118.3 lb

## 2017-02-25 VITALS — BP 108/66 | HR 67 | Temp 97.8°F | Resp 18

## 2017-02-25 DIAGNOSIS — C5701 Malignant neoplasm of right fallopian tube: Secondary | ICD-10-CM

## 2017-02-25 DIAGNOSIS — C786 Secondary malignant neoplasm of retroperitoneum and peritoneum: Secondary | ICD-10-CM | POA: Diagnosis not present

## 2017-02-25 DIAGNOSIS — C801 Malignant (primary) neoplasm, unspecified: Secondary | ICD-10-CM

## 2017-02-25 DIAGNOSIS — Z7189 Other specified counseling: Secondary | ICD-10-CM

## 2017-02-25 DIAGNOSIS — K30 Functional dyspepsia: Secondary | ICD-10-CM | POA: Diagnosis not present

## 2017-02-25 DIAGNOSIS — R109 Unspecified abdominal pain: Secondary | ICD-10-CM | POA: Insufficient documentation

## 2017-02-25 DIAGNOSIS — C762 Malignant neoplasm of abdomen: Secondary | ICD-10-CM

## 2017-02-25 DIAGNOSIS — Z808 Family history of malignant neoplasm of other organs or systems: Secondary | ICD-10-CM | POA: Diagnosis not present

## 2017-02-25 DIAGNOSIS — Z803 Family history of malignant neoplasm of breast: Secondary | ICD-10-CM | POA: Insufficient documentation

## 2017-02-25 DIAGNOSIS — Z5111 Encounter for antineoplastic chemotherapy: Secondary | ICD-10-CM | POA: Diagnosis present

## 2017-02-25 DIAGNOSIS — Z5112 Encounter for antineoplastic immunotherapy: Secondary | ICD-10-CM | POA: Diagnosis not present

## 2017-02-25 LAB — CMP (CANCER CENTER ONLY)
ALT: 42 U/L (ref 0–55)
AST: 31 U/L (ref 5–34)
Albumin: 4.5 g/dL (ref 3.5–5.0)
Alkaline Phosphatase: 94 U/L (ref 40–150)
Anion gap: 9 (ref 3–11)
BUN: 9 mg/dL (ref 7–26)
CO2: 25 mmol/L (ref 22–29)
Calcium: 9.3 mg/dL (ref 8.4–10.4)
Chloride: 102 mmol/L (ref 98–109)
Creatinine: 0.76 mg/dL (ref 0.60–1.10)
GFR, Est AFR Am: >60 mL/min (ref >60)
GFR, Estimated: >60 mL/min (ref >60)
Glucose, Bld: 93 mg/dL (ref 70–140)
Potassium: 4.2 mmol/L (ref 3.3–4.7)
Sodium: 136 mmol/L (ref 136–145)
Total Bilirubin: 0.4 mg/dL (ref 0.2–1.2)
Total Protein: 7.6 g/dL (ref 6.4–8.3)

## 2017-02-25 LAB — CBC WITH DIFFERENTIAL/PLATELET
Basophils Absolute: 0 10*3/uL (ref 0.0–0.1)
Basophils Relative: 1 %
Eosinophils Absolute: 0 10*3/uL (ref 0.0–0.5)
Eosinophils Relative: 0 %
HCT: 34.4 % — ABNORMAL LOW (ref 34.8–46.6)
Hemoglobin: 12.1 g/dL (ref 11.6–15.9)
Lymphocytes Relative: 34 %
Lymphs Abs: 1.8 10*3/uL (ref 0.9–3.3)
MCH: 35.8 pg — ABNORMAL HIGH (ref 25.1–34.0)
MCHC: 35 g/dL (ref 31.5–36.0)
MCV: 102.4 fL — ABNORMAL HIGH (ref 79.5–101.0)
Monocytes Absolute: 0.5 10*3/uL (ref 0.1–0.9)
Monocytes Relative: 9 %
Neutro Abs: 3 10*3/uL (ref 1.5–6.5)
Neutrophils Relative %: 56 %
Platelets: 395 10*3/uL (ref 145–400)
RBC: 3.36 MIL/uL — ABNORMAL LOW (ref 3.70–5.45)
RDW: 13.3 % (ref 11.2–16.1)
WBC: 5.4 10*3/uL (ref 3.9–10.3)

## 2017-02-25 LAB — UA PROTEIN, DIPSTICK - CHCC: Protein, ur: NEGATIVE mg/dL

## 2017-02-25 MED ORDER — LORAZEPAM 2 MG/ML IJ SOLN
0.5000 mg | Freq: Once | INTRAMUSCULAR | Status: AC
  Administered 2017-02-25: 0.5 mg via INTRAVENOUS

## 2017-02-25 MED ORDER — SODIUM CHLORIDE 0.9 % IV SOLN
800.0000 mg/m2 | Freq: Once | INTRAVENOUS | Status: AC
  Administered 2017-02-25: 1254 mg via INTRAVENOUS
  Filled 2017-02-25: qty 32.98

## 2017-02-25 MED ORDER — SODIUM CHLORIDE 0.9 % IV SOLN
Freq: Once | INTRAVENOUS | Status: AC
  Administered 2017-02-25: 10:00:00 via INTRAVENOUS

## 2017-02-25 MED ORDER — BEVACIZUMAB CHEMO INJECTION 400 MG/16ML
800.0000 mg | Freq: Once | INTRAVENOUS | Status: AC
  Administered 2017-02-25: 800 mg via INTRAVENOUS
  Filled 2017-02-25: qty 32

## 2017-02-25 MED ORDER — LORAZEPAM 2 MG/ML IJ SOLN
INTRAMUSCULAR | Status: AC
  Filled 2017-02-25: qty 1

## 2017-02-25 MED ORDER — PROCHLORPERAZINE MALEATE 10 MG PO TABS
ORAL_TABLET | ORAL | Status: AC
  Filled 2017-02-25: qty 1

## 2017-02-25 MED ORDER — PROCHLORPERAZINE MALEATE 10 MG PO TABS
10.0000 mg | ORAL_TABLET | Freq: Once | ORAL | Status: AC
  Administered 2017-02-25: 10 mg via ORAL

## 2017-02-25 NOTE — Progress Notes (Signed)
Kathryn Ramsey  [phone] [phone]     ID: Kathryn Ramsey DOB: Mar 20, 1965  MR#: [id number]  JYN#:[id number]  Patient Care Team: Harlan Stains, MD as PCP - General (Family Medicine) Gordy Levan, MD as Consulting Physician (Oncology) [name], MD as Consulting Physician (Obstetrics and Gynecology) Juanita Craver, MD as Consulting Physician (Gastroenterology) Servando Salina, MD as Consulting Physician (Obstetrics and Gynecology) Nickie Retort, MD as Consulting Physician (Urology) Gillis Ends, MD as Referring Physician (Obstetrics and Gynecology) OTHER MD: Festus Aloe [phone])  CHIEF COMPLAINT: Recurrent ovarian cancer  CURRENT TREATMENT: Gemcitabine and bevacizumab starting 02/04/2017  INTERVAL HISTORY: Kathryn Ramsey returns today for follow-up and treatment of her recurrent ovarian cancer, accompanied by her husband. Today is day 1 cycle 2 of gemcitabine.  She also receives bevacizumab/ Avastin on day 1 of each 21 day cycle. She notes that her last treatment went well.   REVIEW OF SYSTEMS: Kathryn Ramsey is doing well today.  She did experience some fatigue with her chemotherapy, along with some joint aching in her hips.  Otherwise, she has had no issues.  She really liked the Onpro and says she could tell a difference between the Onpro and the Neulasta injection from the nurses in regards to side effects.  She did have some epigastric indigestion that she called into [name] about on 02/22/17 of days ago.  She talked to Val yesterday and Pepcid was recommended yesterday afternoon.  She has not yet started it.  She denies any blood in her stool or black tarry stool.   Kathryn Ramsey denies any other issues and a detailed ROS was otherwise non contributory.     HISTORY OF PRESENT ILLNESS: From Dr. Serita Ramsey original intake note 03/17/2015:  "Kathryn Ramsey is a very pleasant G4P4 who is seen in consultation at the request of Dr Collene Mares and Dr Garwin Brothers for  peritoneal carcinomatosis. The patient has a history of a workup by urology for gross hematuria which included a pelvic examine was suggested for extrinsic mass effect on the bladder on cystoscopy. Cystoscopy took place on in December 2016. The patient denies abdominal pain bloating early satiety or abdominal distention.  On 08/03/2015 at [facility] she underwent a CT scan of the abdomen and pelvis as ordered by Dr Baruch Gouty. This revealed a 1.4 cm low attenuation lesion on the posterior right hepatic lobe suggestive of a hemangioma. Status post hysterectomy. No ovarian masses. Moderate ascites. Omental caking seen in the lateral left abdomen and pelvis. Peritoneal nodularity in the left lateral pelvic cul-de-sac. No gross extrinsic compression on the bladder was identified on imaging.  The patient was then seen by her gastroenterologist, Dr. Collene Mares, who performed a colonoscopy which was unremarkable.  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 She was treated adjuvantly with carboplatin and paclitaxel, as detailed below. Her CA 125 dropped from 1226 on 03/20/2015 to 26.1 by 06/16/2015.  Her subsequent history is as detailed below.  I PAST MEDICAL HISTORY: Past Medical History:  Diagnosis Date  . Acute sensory neuropathy ([facility]) 06/26/2015  . Allergy   . Anemia   . Asthma    illness induced asthma  . Blood transfusion without reported diagnosis    at  age 36 years old D/T surgery to femur being crushed  . 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 Heart murmur    pt, states had a  "working heart murmur"  . History of kidney stones   . PONV (postoperative nausea and vomiting)   . Skin cancer     PAST SURGICAL HISTORY: Past Surgical History:  Procedure Laterality Date  . 3 laporscopic proceedures    . ABDOMINAL HYSTERECTOMY     2010  . CHOLECYSTECTOMY     2010  . DILATION AND CURETTAGE OF UTERUS     1997  . LAPAROSCOPY N/A 03/27/2015   Procedure: DIAGNOSTIC LAPAROSCOPY ;  Surgeon: [name], MD;  Location: [facility];  Service: Gynecology;  Laterality: N/A;  . LAPAROSCOPY N/A 02/24/2016   Procedure: LAPAROSCOPY DIAGNOSTIC WITH PERITONEAL WASHINGS AND PERITONEAL BIOPSY;  Surgeon: [name], MD;  Location: [facility];  Service: Gynecology;  Laterality: N/A;  . LAPAROTOMY N/A 03/27/2015   Procedure: EXPLORATORY LAPAROTOMY, BILATERAL SALPINGO OOPHORECTOMY, OMENTECTOMY, RADICAL TUMOR DEBULKING;  Surgeon: [name], MD;  Location: [facility];  Service: Gynecology;  Laterality: N/A;  . sinus surgery 1994      FAMILY HISTORY Family History  Problem Relation Age of Onset  . Hypertension Father   . Skin cancer Father        nonmelanoma skin cancers in his late 3s  . Non-Hodgkin's lymphoma Maternal Aunt        dx. 24s; smoker  . Stroke Maternal Grandmother   . Other Mother        benign meningioma dx. early-mid-70s; hysterectomy in her late 87s for heavy periods - still has ovaries  . Other Son        one son with pre-cancerous skin findings  . Other Daughter        cysts on ovaries and hx of heavy periods  . Other Sister        hysterectomy for cysts - still has ovaries  . Heart attack Maternal Grandfather   . Heart attack Paternal Grandfather   . Renal cancer Maternal Aunt 60       smoker  . Breast cancer Maternal Aunt 78  . Other Maternal Aunt        dx. benign brain tumor (meningioma) at 50; 2nd benign brain tumor in her late 5s; hx of radical hysterectomy at age 10  . Brain cancer Other        NOS tumor  The patient's parents are both living, in their late 31s as  of January 2018. The patient has one brother, 3 sisters. There is no history of ovarian cancer in the family. One maternal aunt had breast and kidney cancer, another had non-Hodgkin's lymphoma.  GYNECOLOGIC HISTORY:  No LMP recorded. Patient has had a hysterectomy. 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 Her husband [name] is a Engineer, maintenance (IT). Their children are 2, 3, 38 and 5 y/o as of JAN 2018. The oldest works as an Electrical engineer in the [employer] in [place]. The other 3 children live in [place], the youngest currently attending [employer]. Rhe patient has no grandchildren. She attends [employer]    ADVANCED DIRECTIVES:    HEALTH MAINTENANCE: Social History   Tobacco Use  . Smoking status: Never Smoker  . Smokeless tobacco:  Never Used  Substance Use Topics  . Alcohol use: Yes    Comment:  3 glasses wine or beer/week  . Drug use: No     Colonoscopy:2016  PAP: s/p hyst  Bone density:   Allergies  Allergen Reactions  . Bee Venom Shortness Of Breath    swelling  . Sulfa Antibiotics Shortness Of Breath    Vomit , diarrhea , hives  . Iodinated Diagnostic Agents Other (See Comments)    01/27/17 Pt reports sneezing and congestion. Pt states she takes claritin as a premed for CT scan in [place]. Premedicated with Benadryl 25 mg PO. Post CT,no sneezing but had mild drainage/congestion which resolved after a few minutes with oral fluids.    Current Outpatient Medications  Medication Sig Dispense Refill  . gabapentin (NEURONTIN) 100 MG capsule Take 1 capsule (100 mg total) by mouth at bedtime. Takes 164m at bedtime. Takes an additional 1077mduring the day if needed for nerve pain. 90 capsule 4  . ondansetron (ZOFRAN) 8 MG tablet Take 1 tablet (8 mg total) by mouth 2 (two) times daily as needed (Nausea or vomiting). (Patient not taking: Reported on 02/11/2017) 30 tablet 1   No  current facility-administered medications for this visit.     OBJECTIVE:   Vitals:   02/25/17 0858  BP: 122/77  Pulse: 62  Resp: 18  Temp: (!) 97.5 F (36.4 C)  SpO2: 100%     Body mass index is 20.96 kg/m.    ECOG FS:1 - Symptomatic but completely ambulatory  GENERAL: Patient is a well appearing female in no acute distress HEENT:  Sclerae anicteric.  Oropharynx clear and moist. No ulcerations or evidence of oropharyngeal candidiasis. Neck is supple.  NODES:  No cervical, supraclavicular, or axillary lymphadenopathy palpated.  BREAST EXAM:  Deferred. LUNGS:  Clear to auscultation bilaterally.  No wheezes or rhonchi. HEART:  Regular rate and rhythm. No murmur appreciated. ABDOMEN:  Soft, nontender.  Positive, normoactive bowel sounds. No organomegaly palpated. MSK:  No focal spinal tenderness to palpation. Full range of motion bilaterally in the upper extremities. EXTREMITIES:  No peripheral edema.   SKIN:  Clear with no obvious rashes or skin changes. No nail dyscrasia. NEURO:  Nonfocal. Well oriented.  Appropriate affect.      LAB RESULTS: CA 125 was 1677 on 02/04/2017, at the start of treatment   CMP     Component Value Date/Time   NA 140 02/11/2017 0755   K 4.4 02/11/2017 0755   CL 104 06/18/2015 1248   CO2 26 02/11/2017 0755   GLUCOSE 93 02/11/2017 0755   BUN 7.9 02/11/2017 0755   CREATININE 0.7 02/11/2017 0755   CALCIUM 9.6 02/11/2017 0755   PROT 7.5 02/11/2017 0755   ALBUMIN 4.4 02/11/2017 0755   AST 27 02/11/2017 0755   ALT 24 02/11/2017 0755   ALKPHOS 58 02/11/2017 0755   BILITOT 0.43 02/11/2017 0755   GFRNONAA >60 06/18/2015 1248   GFRAA >60 06/18/2015 1248    INo results found for: SPEP, UPEP  Lab Results  Component Value Date   WBC 2.7 (L) 02/11/2017   NEUTROABS 0.9 (L) 02/11/2017   HGB 12.1 02/11/2017   HCT 35.8 02/11/2017   MCV 103.5 (H) 02/11/2017   PLT 151 02/11/2017      Chemistry      Component Value Date/Time   NA 140  02/11/2017 0755   K 4.4 02/11/2017 0755   CL 104 06/18/2015 1248   CO2 26 02/11/2017 0755  BUN 7.9 02/11/2017 0755   CREATININE 0.7 02/11/2017 0755      Component Value Date/Time   CALCIUM 9.6 02/11/2017 0755   ALKPHOS 58 02/11/2017 0755   AST 27 02/11/2017 0755   ALT 24 02/11/2017 0755   BILITOT 0.43 02/11/2017 0755       No results found for: LABCA2  No components found for: LABCA125  No results for input(s): INR in the last 168 hours.  Urinalysis    Component Value Date/Time   COLORURINE YELLOW 03/29/2015 0920   APPEARANCEUR CLEAR 03/29/2015 0920   LABSPEC 1.005 10/09/2015 1239   PHURINE 6.0 10/09/2015 1239   PHURINE 7.5 03/29/2015 0920   GLUCOSEU Negative 10/09/2015 1239   HGBUR Negative 10/09/2015 1239   HGBUR SMALL (A) 03/29/2015 0920   BILIRUBINUR Negative 10/09/2015 1239   KETONESUR Negative 10/09/2015 1239   KETONESUR NEGATIVE 03/29/2015 0920   PROTEINUR NEGATIVE 02/25/2017 0835   UROBILINOGEN 0.2 10/09/2015 1239   NITRITE Negative 10/09/2015 1239   NITRITE NEGATIVE 03/29/2015 0920   LEUKOCYTESUR Negative 10/09/2015 1239     STUDIES: Outside films discussed  ELIGIBLE FOR AVAILABLE RESEARCH PROTOCOL:  discussing protocols at [facility]: 52 y.o. [place] woman status post radical tumor debulking with optimal cytoreduction (R0) 03/27/2015 for a stage IIIC, high-grade right fallopian tube carcinoma  (a) baseline CA-125 was1226.  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)  (1) adjuvant chemotherapy consisted of carboplatin and paclitaxel for 6 doses, begun 04/14/2015, completed 08/11/2015  (a) paclitaxel was omitted from cycle 5 and dose reduced on cycle 6 because of neuropathy   (b) a "make up" dose of paclitaxel was given 08/25/2015  (c) last carboplatin dose was  08/11/2015  (d) CA-125 normalized by 06/16/2015   (2) FIRST RECURRENCE: January 2018  (a) CA-125 rise beginning November 2017 led to CT scans and PET scans December 2017, all negative  (b) exploratory laparotomy 02/24/2016 showed pathologically confirmed recurrence, with miliary disease involving all examined surfaces  (c) port-site metastases noted 03/08/2016  (3) carboplatin/liposomal doxorubicin started 03/05/2016, repeated every three weeks x4, completed 06/04/2016.  (a) cycle 3 delayed one week because of neutropenia; OnPro added  (4) RUCAPARIB started 07/02/2016  (a) restaging 10/01/2016: Normal CA 125, negative CT of the abdomen and pelvis  (b) labs 11/24/2016 shows a rise in her CA 125-43.4, with continuing rise thereafter  (c) rucaparib discontinued October 2018  (5) anastrozole started December 23, 2016-stopped after a couple of weeks  SECOND RECURRENCE: (6) Gemcitabine/Bevacizumab started on 02/04/2017  PLAN Erandy is doing well today.  Her CBC is normal today, CMET pending.  She will proceed with treatment.  I recommended when she takes Pepcid in the morning, to take it first thing in the morning on an empty stomach.  She will return in one week for labs, f/u with Dr. Jana Hakim, and Gemcitabine. She knows to call if the pain in her stomach worsens or for any other questions or concerns.    A total of (20) minutes of face-to-face time was spent with this patient with greater than 50% of that time in counseling and care-coordination.   Wilber Bihari, NP  02/25/17 9:08 AM Medical Oncology and Hematology [facility] [address] [place], [place] [address] Tel. [phone]    Fax. [phone]

## 2017-02-25 NOTE — Patient Instructions (Signed)
Cancer Center Discharge Instructions for Patients Receiving Chemotherapy  Today you received the following chemotherapy agents Avastin and Gemzar  To help prevent nausea and vomiting after your treatment, we encourage you to take your nausea medication as directed   If you develop nausea and vomiting that is not controlled by your nausea medication, call the clinic.   BELOW ARE SYMPTOMS THAT SHOULD BE REPORTED IMMEDIATELY:  *FEVER GREATER THAN 100.5 F  *CHILLS WITH OR WITHOUT FEVER  NAUSEA AND VOMITING THAT IS NOT CONTROLLED WITH YOUR NAUSEA MEDICATION  *UNUSUAL SHORTNESS OF BREATH  *UNUSUAL BRUISING OR BLEEDING  TENDERNESS IN MOUTH AND THROAT WITH OR WITHOUT PRESENCE OF ULCERS  *URINARY PROBLEMS  *BOWEL PROBLEMS  UNUSUAL RASH Items with * indicate a potential emergency and should be followed up as soon as possible.  Feel free to call the clinic should you have any questions or concerns. The clinic phone number is [phone].  Please show the CHEMO ALERT CARD at check-in to the Emergency Department and triage nurse.   

## 2017-02-25 NOTE — Progress Notes (Addendum)
[facility]  [phone] [phone]     ID: Kathryn Ramsey DOB: 31-Oct-1965  MR#: [id number]  WPV#:[id number]  Patient Care Team: Harlan Stains, MD as PCP - General (Family Medicine) Gordy Levan, MD as Consulting Physician (Oncology) [name], MD as Consulting Physician (Obstetrics and Gynecology) Juanita Craver, MD as Consulting Physician (Gastroenterology) Servando Salina, MD as Consulting Physician (Obstetrics and Gynecology) Nickie Retort, MD as Consulting Physician (Urology) Gillis Ends, MD as Referring Physician (Obstetrics and Gynecology) OTHER MD: Festus Aloe [phone])  CHIEF COMPLAINT: Recurrent ovarian cancer  CURRENT TREATMENT: Gemcitabine and bevacizumab starting 02/04/2017  INTERVAL HISTORY: Kathryn Ramsey returns today for a follow-up and treatment of her recurrent ovarian cancer accompanied by her husband [name]..  Today is day 8 cycle 2 of gemcitabine and bevacizumab, with the bevacizumab given every 21 days and gemcitabine on days 1 and 8 of each treatment cycle.  Since her last visit here we obtained a repeat Ca1 25 which shows a very gratifying drop, from 16772 to 626.5.   REVIEW OF SYSTEMS: Fionnuala is tolerating her current treatment remarkably well.  She has had minimal epistaxis with the bevacizumab.  There has been no increase in blood pressure.  She tolerates the Gemzar with no problems with nausea or other side effects that she is aware of.  She has had some arthritis like pain in her hips and ankles sometimes her shoulders.  She is doing high intensity interval training twice a week and warm yoga twice a week.  This can account partly for some of those symptoms.  She does have indigestion and worries about epigastric discomfort.  She was started on Pepcid yesterday.  She has not really begun to take it.  She tells me her bowel movements are the best they have ever been and she thinks that may be because she  went off all her supplements.  A detailed review of systems today was otherwise noncontributory   HISTORY OF PRESENT ILLNESS: From Dr. Serita Grit original intake note 03/17/2015:  "Kathryn Ramsey is a very pleasant G4P4 who is seen in consultation at the request of Dr Collene Mares and Dr Garwin Brothers for peritoneal carcinomatosis. The patient has a history of a workup by urology for gross hematuria which included a pelvic examine was suggested for extrinsic mass effect on the bladder on cystoscopy. Cystoscopy took place on in December 2016. The patient denies abdominal pain bloating early satiety or abdominal distention.  On 08/03/2015 at [facility] she underwent a CT scan of the abdomen and pelvis as ordered by Dr Baruch Gouty. This revealed a 1.4 cm low attenuation lesion on the posterior right hepatic lobe suggestive of a hemangioma. Status post hysterectomy. No ovarian masses. Moderate ascites. Omental caking seen in the lateral left abdomen and pelvis. Peritoneal nodularity in the left lateral pelvic cul-de-sac. No gross extrinsic compression on the bladder was identified on imaging.  The patient was then seen by her gastroenterologist, Dr. Collene Mares, who performed a colonoscopy which was unremarkable.  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 She was treated adjuvantly with carboplatin and paclitaxel, as detailed below. Her CA 125 dropped from 1226 on 03/20/2015 to 26.1 by 06/16/2015.  Her subsequent  history is as detailed below.  I PAST MEDICAL HISTORY: Past Medical History:  Diagnosis Date  . Acute sensory neuropathy ([place]) 06/26/2015  . Allergy   . Anemia   .  Asthma    illness induced asthma  . Blood transfusion without reported diagnosis    at age 72 years old D/T surgery to femur being crushed  . 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 Heart murmur    pt, states had a "working heart murmur"  . History of kidney stones   . PONV (postoperative nausea and vomiting)   . Skin cancer     PAST SURGICAL HISTORY: Past Surgical History:  Procedure Laterality Date  . 3 laporscopic proceedures    . ABDOMINAL HYSTERECTOMY     2010  . CHOLECYSTECTOMY     2010  . DILATION AND CURETTAGE OF UTERUS     1997  . LAPAROSCOPY N/A 03/27/2015   Procedure: DIAGNOSTIC LAPAROSCOPY ;  Surgeon: [name], MD;  Location: [facility];  Service: Gynecology;  Laterality: N/A;  . LAPAROSCOPY N/A 02/24/2016   Procedure: LAPAROSCOPY DIAGNOSTIC WITH PERITONEAL WASHINGS AND PERITONEAL BIOPSY;  Surgeon: [name], MD;  Location: [facility];  Service: Gynecology;  Laterality: N/A;  . LAPAROTOMY N/A 03/27/2015   Procedure: EXPLORATORY LAPAROTOMY, BILATERAL SALPINGO OOPHORECTOMY, OMENTECTOMY, RADICAL TUMOR DEBULKING;  Surgeon: [name], MD;  Location: [facility];  Service: Gynecology;  Laterality: N/A;  . sinus surgery 1994      FAMILY HISTORY Family History  Problem Relation Age of Onset  . Hypertension Father   . Skin cancer Father        nonmelanoma skin cancers in his late 1s  . Non-Hodgkin's lymphoma Maternal Aunt        dx. 78s; smoker  . Stroke Maternal Grandmother   . Other Mother        benign meningioma dx. early-mid-70s; hysterectomy in her late 50s for heavy periods - still has ovaries  . Other Son        one son with pre-cancerous skin findings  . Other Daughter        cysts on ovaries and hx of heavy periods  . Other Sister        hysterectomy for cysts - still has ovaries  . Heart attack Maternal Grandfather   . Heart attack Paternal Grandfather   . Renal cancer Maternal Aunt 60        smoker  . Breast cancer Maternal Aunt 78  . Other Maternal Aunt        dx. benign brain tumor (meningioma) at 35; 2nd benign brain tumor in her late 73s; hx of radical hysterectomy at age 60  . Brain cancer Other        NOS tumor  The patient's parents are both living, in their late 43s as of January 2018. The patient has one brother, 3 sisters. There is no history of ovarian cancer in the family. One maternal aunt had breast and kidney cancer, another had non-Hodgkin's lymphoma.  GYNECOLOGIC HISTORY:  No LMP recorded. Patient has had a hysterectomy. 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 Her husband [name] is a Engineer, maintenance (IT). Their children are 14, 11, 71 and 10 y/o as of JAN 2018. The oldest works as an Electrical engineer in the [employer]  business school in [place]. The other 3 children live in [place], the youngest currently attending [employer]. Rhe patient has no grandchildren. She attends [employer]    ADVANCED DIRECTIVES:    HEALTH MAINTENANCE: Social History   Tobacco Use  . Smoking status: Never Smoker  . Smokeless tobacco: Never Used  Substance Use Topics  . Alcohol use: Yes    Comment:  3 glasses wine or beer/week  . Drug use: No     Colonoscopy:2016  PAP: s/p hyst  Bone density:   Allergies  Allergen Reactions  . Bee Venom Shortness Of Breath    swelling  . Sulfa Antibiotics Shortness Of Breath    Vomit , diarrhea , hives  . Iodinated Diagnostic Agents Other (See Comments)    01/27/17 Pt reports sneezing and congestion. Pt states she takes claritin as a premed for CT scan in [place]. Premedicated with Benadryl 25 mg PO. Post CT,no sneezing but had mild drainage/congestion which resolved after a few minutes with oral fluids.    Current Outpatient Medications  Medication Sig Dispense Refill  . famotidine (PEPCID) 20 MG tablet Take 20 mg by mouth daily.    [name] gabapentin  (NEURONTIN) 100 MG capsule Take 1 capsule (100 mg total) by mouth at bedtime. Takes 158m at bedtime. Takes an additional 1035mduring the day if needed for nerve pain. 90 capsule 4  . ondansetron (ZOFRAN) 8 MG tablet Take 1 tablet (8 mg total) by mouth 2 (two) times daily as needed (Nausea or vomiting). 30 tablet 1   No current facility-administered medications for this visit.     OBJECTIVE: Middle-aged white woman who appears well  Vitals:   03/04/17 0850  BP: 123/72  Pulse: 65  Resp: 18  Temp: 98.3 F (36.8 C)  SpO2: 100%     Body mass index is 21.13 kg/m.    ECOG FS:1 - Symptomatic but completely ambulatory  Sclerae unicteric, pupils round and equal Oropharynx clear and moist No cervical or supraclavicular adenopathy Lungs no rales or rhonchi Heart regular rate and rhythm Abd soft, nontender, positive bowel sounds MSK no focal spinal tenderness, no upper extremity lymphedema Neuro: nonfocal, well oriented, appropriate affect Breasts: Deferred Skin: The port site metastases are perfectly flat, no palpable or observable activity.   LAB RESULTS:  CMP     Component Value Date/Time   NA 140 03/04/2017 0828   NA 140 02/11/2017 0755   K 4.2 03/04/2017 0828   K 4.4 02/11/2017 0755   CL 107 03/04/2017 0828   CO2 25 03/04/2017 0828   CO2 26 02/11/2017 0755   GLUCOSE 83 03/04/2017 0828   GLUCOSE 93 02/11/2017 0755   BUN 9 03/04/2017 0828   BUN 7.9 02/11/2017 0755   CREATININE 0.7 02/11/2017 0755   CALCIUM 9.1 03/04/2017 0828   CALCIUM 9.6 02/11/2017 0755   PROT 7.1 03/04/2017 0828   PROT 7.5 02/11/2017 0755   ALBUMIN 4.1 03/04/2017 0828   ALBUMIN 4.4 02/11/2017 0755   AST 110 (H) 03/04/2017 0828   AST 27 02/11/2017 0755   ALT 197 (H) 03/04/2017 0828   ALT 24 02/11/2017 0755   ALKPHOS 75 03/04/2017 0828   ALKPHOS 58 02/11/2017 0755   BILITOT 0.3 03/04/2017 0828   BILITOT 0.43 02/11/2017 0755   GFRNONAA >60 03/04/2017 0828   GFRAA >60 03/04/2017 0828    INo  results found for: SPEP, UPEP  Lab Results  Component Value Date   WBC 2.8 (L) 03/04/2017   NEUTROABS 1.3 (L)  03/04/2017   HGB 10.5 (L) 03/04/2017   HCT 31.4 (L) 03/04/2017   MCV 103.3 (H) 03/04/2017   PLT 304 03/04/2017      Chemistry      Component Value Date/Time   NA 140 03/04/2017 0828   NA 140 02/11/2017 0755   K 4.2 03/04/2017 0828   K 4.4 02/11/2017 0755   CL 107 03/04/2017 0828   CO2 25 03/04/2017 0828   CO2 26 02/11/2017 0755   BUN 9 03/04/2017 0828   BUN 7.9 02/11/2017 0755   CREATININE 0.7 02/11/2017 0755      Component Value Date/Time   CALCIUM 9.1 03/04/2017 0828   CALCIUM 9.6 02/11/2017 0755   ALKPHOS 75 03/04/2017 0828   ALKPHOS 58 02/11/2017 0755   AST 110 (H) 03/04/2017 0828   AST 27 02/11/2017 0755   ALT 197 (H) 03/04/2017 0828   ALT 24 02/11/2017 0755   BILITOT 0.3 03/04/2017 0828   BILITOT 0.43 02/11/2017 0755       No results found for: LABCA2  No components found for: [id number]  No results for input(s): INR in the last 168 hours.  Urinalysis    Component Value Date/Time   COLORURINE YELLOW 03/29/2015 0920   APPEARANCEUR CLEAR 03/29/2015 0920   LABSPEC 1.005 10/09/2015 1239   PHURINE 6.0 10/09/2015 1239   PHURINE 7.5 03/29/2015 0920   GLUCOSEU Negative 10/09/2015 1239   HGBUR Negative 10/09/2015 1239   HGBUR SMALL (A) 03/29/2015 0920   BILIRUBINUR Negative 10/09/2015 1239   KETONESUR Negative 10/09/2015 1239   KETONESUR NEGATIVE 03/29/2015 0920   PROTEINUR NEGATIVE 03/04/2017 0830   UROBILINOGEN 0.2 10/09/2015 1239   NITRITE Negative 10/09/2015 1239   NITRITE NEGATIVE 03/29/2015 0920   LEUKOCYTESUR Negative 10/09/2015 1239     STUDIES: CA 125 shows a very gratifying decrease  ELIGIBLE FOR AVAILABLE RESEARCH PROTOCOL:  discussing protocols at [facility]: 52 y.o. [place] woman status post radical tumor debulking with optimal cytoreduction (R0) 03/27/2015 for a stage IIIC, high-grade right fallopian tube  carcinoma  (a) baseline CA-125 was1226.  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)  (1) adjuvant chemotherapy consisted of carboplatin and paclitaxel for 6 doses, begun 04/14/2015, completed 08/11/2015  (a) paclitaxel was omitted from cycle 5 and dose reduced on cycle 6 because of neuropathy   (b) a "make up" dose of paclitaxel was given 08/25/2015  (c) last carboplatin dose was 08/11/2015  (d) CA-125 normalized by 06/16/2015   (2) FIRST RECURRENCE: January 2018  (a) CA-125 rise beginning November 2017 led to CT scans and PET scans December 2017, all negative  (b) exploratory laparotomy 02/24/2016 showed pathologically confirmed recurrence, with miliary disease involving all examined surfaces  (c) port-site metastases noted 03/08/2016  (3) carboplatin/liposomal doxorubicin started 03/05/2016, repeated every three weeks x4, completed 06/04/2016.  (a) cycle 3 delayed one week because of neutropenia; OnPro added  (4) RUCAPARIB started 07/02/2016  (a) restaging 10/01/2016: Normal CA 125, negative CT of the abdomen and pelvis  (b) labs 11/24/2016 shows a rise in her CA 125-43.4, with continuing rise thereafter  (c) rucaparib discontinued October 2018  (5) anastrozole started December 23, 2016-stopped after a couple of weeks  SECOND RECURRENCE: (6) Gemcitabine/Bevacizumab started on 02/04/2017  PLAN Hadlie is tolerating her treatment well and she is having a remarkable early response.  This is very gratifying.  We reviewed  side effects and toxicities today.  In particular she will call us if there is any problem with bleeding, trauma, or worsening epistaxis issues.  She continues to be not only very active physically but also socially: She raised $95,000 for ovarian cancer research and support through her organization  last year.  That was their first year,too.  We are waiting on molecular results from [facility] to see if that gives [name] hints regarding possible future treatment.  For the time being however she is doing quite well.  She will receive Gemzar on days 1 and 8 with OnPro support on day 9, and bevacizumab on day 1 of each cycle.  I will see her on day 8 of each cycle.  When we reach a normal CA 125 level we will consider dropping the Gemzar and continuing the bevacizumab alone  She knows to call for any problems that may develop before the next visit.  Magrinat, Virgie Dad, MD  03/04/17 9:16 AM Medical Oncology and Hematology [facility] [address] [place], [place] [address] Tel. [phone]    Fax. [phone]   This document serves as a record of services personally performed by Chauncey Cruel, MD. It was created on his behalf by Margit Banda, a trained medical scribe. The creation of this record is based on the scribe's personal observations and the provider's statements to them.   I have reviewed the above documentation for accuracy and completeness, and I agree with the above.  ADDENDUM: This the med shows moderate transaminitis.  I do not have a simple explanation for this.  She is not on any new drug, in fact has gone off all her supplements.  [place] does not generally do this.  She had a CT of the abdomen at [facility] which showed no significant findings in the liver.  In short I suspect this is due to the bevacizumab.  We are going to hold treatment today repeat labs on 03/08/2017, and I have scheduled her for her next Gemzar on 03/11/2017.  However if there is further increase in the transaminases I will obtain an MRI of the liver  She is disappointed but agrees with this plan.

## 2017-02-26 LAB — CA 125: Cancer Antigen (CA) 125: 626.5 U/mL — ABNORMAL HIGH (ref 0.0–38.1)

## 2017-02-28 ENCOUNTER — Encounter: Payer: Self-pay | Admitting: Oncology

## 2017-02-28 ENCOUNTER — Other Ambulatory Visit: Payer: Self-pay | Admitting: Oncology

## 2017-03-02 ENCOUNTER — Other Ambulatory Visit: Payer: Self-pay | Admitting: Oncology

## 2017-03-02 NOTE — Progress Notes (Unsigned)
[address]  [phone] Fax:([phone]) [phone]     ID: Kathryn Ramsey DOB: 09/13/1965  MR#: [id number]  GQQ#:[id number]  Patient Care Team: Harlan Stains, MD as PCP - General (Family Medicine) Gordy Levan, MD as Consulting Physician (Oncology) [name], MD as Consulting Physician (Obstetrics and Gynecology) Juanita Craver, MD as Consulting Physician (Gastroenterology) Servando Salina, MD as Consulting Physician (Obstetrics and Gynecology) Nickie Retort, MD as Consulting Physician (Urology) Gillis Ends, MD as Referring Physician (Obstetrics and Gynecology) OTHER MD: Festus Aloe [phone])  CHIEF COMPLAINT: Recurrent ovarian cancer  CURRENT TREATMENT: Gemcitabine and bevacizumab starting 02/04/2017  INTERVAL HISTORY: Kathryn Ramsey returns today for follow-up and treatment of her recurrent ovarian cancer, accompanied by her husband. Today is day 1 cycle 2 of gemcitabine.  She also receives bevacizumab/ Avastin on day 1 of each 21 day cycle. She notes that her last treatment went well.   REVIEW OF SYSTEMS: Kathryn Ramsey is doing well today.  She did experience some fatigue with her chemotherapy, along with some joint aching in her hips.  Otherwise, she has had no issues.  She really liked the Onpro and says she could tell a difference between the Onpro and the Neulasta injection from the nurses in regards to side effects.  She did have some epigastric indigestion that she called into [name] about on 02/22/17 of days ago.  She talked to Val yesterday and Pepcid was recommended yesterday afternoon.  She has not yet started it.  She denies any blood in her stool or black tarry stool.   Kathryn Ramsey denies any other issues and a detailed ROS was otherwise non contributory.     HISTORY OF PRESENT ILLNESS: From Dr. Serita Grit original intake note 03/17/2015:  "Oma Marzan is a very pleasant G4P4 who is seen in consultation at the request of Dr Collene Mares and Dr Garwin Brothers for  peritoneal carcinomatosis. The patient has a history of a workup by urology for gross hematuria which included a pelvic examine was suggested for extrinsic mass effect on the bladder on cystoscopy. Cystoscopy took place on in December 2016. The patient denies abdominal pain bloating early satiety or abdominal distention.  On 08/03/2015 at [facility] she underwent a CT scan of the abdomen and pelvis as ordered by Dr Baruch Gouty. This revealed a 1.4 cm low attenuation lesion on the posterior right hepatic lobe suggestive of a hemangioma. Status post hysterectomy. No ovarian masses. Moderate ascites. Omental caking seen in the lateral left abdomen and pelvis. Peritoneal nodularity in the left lateral pelvic cul-de-sac. No gross extrinsic compression on the bladder was identified on imaging.  The patient was then seen by her gastroenterologist, Dr. Collene Mares, who performed a colonoscopy which was unremarkable.  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 She was treated adjuvantly with carboplatin and paclitaxel, as detailed below. Her CA 125 dropped from 1226 on 03/20/2015 to 26.1 by 06/16/2015.  Her subsequent history is as detailed below.  I PAST MEDICAL HISTORY: Past Medical History:  Diagnosis Date  . Acute sensory neuropathy ([place]) 06/26/2015  . Allergy   . Anemia   . Asthma    illness induced asthma  . Blood transfusion without reported diagnosis    at  age 36 years old D/T surgery to femur being crushed  . 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 Heart murmur    pt, states had a  "working heart murmur"  . History of kidney stones   . PONV (postoperative nausea and vomiting)   . Skin cancer     PAST SURGICAL HISTORY: Past Surgical History:  Procedure Laterality Date  . 3 laporscopic proceedures    . ABDOMINAL HYSTERECTOMY     2010  . CHOLECYSTECTOMY     2010  . DILATION AND CURETTAGE OF UTERUS     1997  . LAPAROSCOPY N/A 03/27/2015   Procedure: DIAGNOSTIC LAPAROSCOPY ;  Surgeon: [name], MD;  Location: [facility];  Service: Gynecology;  Laterality: N/A;  . LAPAROSCOPY N/A 02/24/2016   Procedure: LAPAROSCOPY DIAGNOSTIC WITH PERITONEAL WASHINGS AND PERITONEAL BIOPSY;  Surgeon: [name], MD;  Location: [facility];  Service: Gynecology;  Laterality: N/A;  . LAPAROTOMY N/A 03/27/2015   Procedure: EXPLORATORY LAPAROTOMY, BILATERAL SALPINGO OOPHORECTOMY, OMENTECTOMY, RADICAL TUMOR DEBULKING;  Surgeon: [name], MD;  Location: [facility];  Service: Gynecology;  Laterality: N/A;  . sinus surgery 1994      FAMILY HISTORY Family History  Problem Relation Age of Onset  . Hypertension Father   . Skin cancer Father        nonmelanoma skin cancers in his late 3s  . Non-Hodgkin's lymphoma Maternal Aunt        dx. 24s; smoker  . Stroke Maternal Grandmother   . Other Mother        benign meningioma dx. early-mid-70s; hysterectomy in her late 87s for heavy periods - still has ovaries  . Other Son        one son with pre-cancerous skin findings  . Other Daughter        cysts on ovaries and hx of heavy periods  . Other Sister        hysterectomy for cysts - still has ovaries  . Heart attack Maternal Grandfather   . Heart attack Paternal Grandfather   . Renal cancer Maternal Aunt 60       smoker  . Breast cancer Maternal Aunt 78  . Other Maternal Aunt        dx. benign brain tumor (meningioma) at 50; 2nd benign brain tumor in her late 5s; hx of radical hysterectomy at age 10  . Brain cancer Other        NOS tumor  The patient's parents are both living, in their late 31s as  of January 2018. The patient has one brother, 3 sisters. There is no history of ovarian cancer in the family. One maternal aunt had breast and kidney cancer, another had non-Hodgkin's lymphoma.  GYNECOLOGIC HISTORY:  No LMP recorded. Patient has had a hysterectomy. 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 Her husband Kathryn Ramsey is a Engineer, maintenance (IT). Their children are 2, 3, 38 and 5 y/o as of JAN 2018. The oldest works as an Electrical engineer in the [employer] in [place]. The other 3 children live in [place], the youngest currently attending [employer]. Rhe patient has no grandchildren. She attends [employer]    ADVANCED DIRECTIVES:    HEALTH MAINTENANCE: Social History   Tobacco Use  . Smoking status: Never Smoker  . Smokeless tobacco:  Never Used  Substance Use Topics  . Alcohol use: Yes    Comment:  3 glasses wine or beer/week  . Drug use: No     Colonoscopy:2016  PAP: s/p hyst  Bone density:   Allergies  Allergen Reactions  . Bee Venom Shortness Of Breath    swelling  . Sulfa Antibiotics Shortness Of Breath    Vomit , diarrhea , hives  . Iodinated Diagnostic Agents Other (See Comments)    01/27/17 Pt reports sneezing and congestion. Pt states she takes claritin as a premed for CT scan in [place]. Premedicated with Benadryl 25 mg PO. Post CT,no sneezing but had mild drainage/congestion which resolved after a few minutes with oral fluids.    Current Outpatient Medications  Medication Sig Dispense Refill  . gabapentin (NEURONTIN) 100 MG capsule Take 1 capsule (100 mg total) by mouth at bedtime. Takes '100mg'$  at bedtime. Takes an additional '100mg'$  during the day if needed for nerve pain. 90 capsule 4  . ondansetron (ZOFRAN) 8 MG tablet Take 1 tablet (8 mg total) by mouth 2 (two) times daily as needed (Nausea or vomiting). (Patient not taking: Reported on 02/11/2017) 30 tablet 1   No  current facility-administered medications for this visit.     OBJECTIVE:   There were no vitals filed for this visit.   There is no height or weight on file to calculate BMI.    ECOG FS:1 - Symptomatic but completely ambulatory  GENERAL: Patient is a well appearing female in no acute distress HEENT:  Sclerae anicteric.  Oropharynx clear and moist. No ulcerations or evidence of oropharyngeal candidiasis. Neck is supple.  NODES:  No cervical, supraclavicular, or axillary lymphadenopathy palpated.  BREAST EXAM:  Deferred. LUNGS:  Clear to auscultation bilaterally.  No wheezes or rhonchi. HEART:  Regular rate and rhythm. No murmur appreciated. ABDOMEN:  Soft, nontender.  Positive, normoactive bowel sounds. No organomegaly palpated. MSK:  No focal spinal tenderness to palpation. Full range of motion bilaterally in the upper extremities. EXTREMITIES:  No peripheral edema.   SKIN:  Clear with no obvious rashes or skin changes. No nail dyscrasia. NEURO:  Nonfocal. Well oriented.  Appropriate affect.      LAB RESULTS: CA 125 was 1677 on 02/04/2017, at the start of treatment   CMP     Component Value Date/Time   NA 136 02/25/2017 1005   NA 140 02/11/2017 0755   K 4.2 02/25/2017 1005   K 4.4 02/11/2017 0755   CL 102 02/25/2017 1005   CO2 25 02/25/2017 1005   CO2 26 02/11/2017 0755   GLUCOSE 93 02/25/2017 1005   GLUCOSE 93 02/11/2017 0755   BUN 9 02/25/2017 1005   BUN 7.9 02/11/2017 0755   CREATININE 0.7 02/11/2017 0755   CALCIUM 9.3 02/25/2017 1005   CALCIUM 9.6 02/11/2017 0755   PROT 7.6 02/25/2017 1005   PROT 7.5 02/11/2017 0755   ALBUMIN 4.5 02/25/2017 1005   ALBUMIN 4.4 02/11/2017 0755   AST 31 02/25/2017 1005   AST 27 02/11/2017 0755   ALT 42 02/25/2017 1005   ALT 24 02/11/2017 0755   ALKPHOS 94 02/25/2017 1005   ALKPHOS 58 02/11/2017 0755   BILITOT 0.4 02/25/2017 1005   BILITOT 0.43 02/11/2017 0755   GFRNONAA >60 06/18/2015 1248   GFRAA >60 06/18/2015 1248     INo results found for: SPEP, UPEP  Lab Results  Component Value Date   WBC 5.4 02/25/2017   NEUTROABS 3.0 02/25/2017   HGB 12.1  02/25/2017   HCT 34.4 (L) 02/25/2017   MCV 102.4 (H) 02/25/2017   PLT 395 02/25/2017      Chemistry      Component Value Date/Time   NA 136 02/25/2017 1005   NA 140 02/11/2017 0755   K 4.2 02/25/2017 1005   K 4.4 02/11/2017 0755   CL 102 02/25/2017 1005   CO2 25 02/25/2017 1005   CO2 26 02/11/2017 0755   BUN 9 02/25/2017 1005   BUN 7.9 02/11/2017 0755   CREATININE 0.7 02/11/2017 0755      Component Value Date/Time   CALCIUM 9.3 02/25/2017 1005   CALCIUM 9.6 02/11/2017 0755   ALKPHOS 94 02/25/2017 1005   ALKPHOS 58 02/11/2017 0755   AST 31 02/25/2017 1005   AST 27 02/11/2017 0755   ALT 42 02/25/2017 1005   ALT 24 02/11/2017 0755   BILITOT 0.4 02/25/2017 1005   BILITOT 0.43 02/11/2017 0755       No results found for: LABCA2  No components found for: LABCA125  No results for input(s): INR in the last 168 hours.  Urinalysis    Component Value Date/Time   COLORURINE YELLOW 03/29/2015 0920   APPEARANCEUR CLEAR 03/29/2015 0920   LABSPEC 1.005 10/09/2015 1239   PHURINE 6.0 10/09/2015 1239   PHURINE 7.5 03/29/2015 0920   GLUCOSEU Negative 10/09/2015 1239   HGBUR Negative 10/09/2015 1239   HGBUR SMALL (A) 03/29/2015 0920   BILIRUBINUR Negative 10/09/2015 1239   KETONESUR Negative 10/09/2015 1239   KETONESUR NEGATIVE 03/29/2015 0920   PROTEINUR NEGATIVE 02/25/2017 0835   UROBILINOGEN 0.2 10/09/2015 1239   NITRITE Negative 10/09/2015 1239   NITRITE NEGATIVE 03/29/2015 0920   LEUKOCYTESUR Negative 10/09/2015 1239     STUDIES: Outside films discussed  ELIGIBLE FOR AVAILABLE RESEARCH PROTOCOL:  discussing protocols at [facility]: 52 y.o. [place] woman status post radical tumor debulking with optimal cytoreduction (R0) 03/27/2015 for a stage IIIC, high-grade right fallopian tube carcinoma  (a) baseline CA-125  was1226.  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)  (1) adjuvant chemotherapy consisted of carboplatin and paclitaxel for 6 doses, begun 04/14/2015, completed 08/11/2015  (a) paclitaxel was omitted from cycle 5 and dose reduced on cycle 6 because of neuropathy   (b) a "make up" dose of paclitaxel was given 08/25/2015  (c) last carboplatin dose was 08/11/2015  (d) CA-125 normalized by 06/16/2015   (2) FIRST RECURRENCE: January 2018  (a) CA-125 rise beginning November 2017 led to CT scans and PET scans December 2017, all negative  (b) exploratory laparotomy 02/24/2016 showed pathologically confirmed recurrence, with miliary disease involving all examined surfaces  (c) port-site metastases noted 03/08/2016  (3) carboplatin/liposomal doxorubicin started 03/05/2016, repeated every three weeks x4, completed 06/04/2016.  (a) cycle 3 delayed one week because of neutropenia; OnPro added  (4) RUCAPARIB started 07/02/2016  (a) restaging 10/01/2016: Normal CA 125, negative CT of the abdomen and pelvis  (b) labs 11/24/2016 shows a rise in her CA 125-43.4, with continuing rise thereafter  (c) rucaparib discontinued October 2018  (5) anastrozole started December 23, 2016-stopped after a couple of weeks  SECOND RECURRENCE: (6) Gemcitabine/Bevacizumab started on 02/04/2017  PLAN Kathryn Ramsey is doing well today.  Her CBC is normal today, CMET pending.  She will proceed with treatment.  I recommended when she takes Pepcid in the morning, to take it first thing  in the morning on an empty stomach.  She will return in one week for labs, f/u with Dr. Jana Hakim, and Gemcitabine. She knows to call if the pain in her stomach worsens or for any other questions or concerns.    A total of (20) minutes of face-to-face time was spent with this patient  with greater than 50% of that time in counseling and care-coordination.   Kathryn Bihari, NP  03/02/17 8:00 AM Medical Oncology and Hematology [facility] [address] [place], [place] [address] Tel. [phone]    Fax. [phone]

## 2017-03-04 ENCOUNTER — Inpatient Hospital Stay (HOSPITAL_BASED_OUTPATIENT_CLINIC_OR_DEPARTMENT_OTHER): Payer: 59 | Admitting: Oncology

## 2017-03-04 ENCOUNTER — Inpatient Hospital Stay: Payer: 59

## 2017-03-04 ENCOUNTER — Telehealth: Payer: Self-pay | Admitting: Oncology

## 2017-03-04 ENCOUNTER — Other Ambulatory Visit: Payer: Self-pay | Admitting: *Deleted

## 2017-03-04 VITALS — BP 123/72 | HR 65 | Temp 98.3°F | Resp 18 | Ht 63.0 in | Wt 119.3 lb

## 2017-03-04 DIAGNOSIS — D701 Agranulocytosis secondary to cancer chemotherapy: Secondary | ICD-10-CM

## 2017-03-04 DIAGNOSIS — C786 Secondary malignant neoplasm of retroperitoneum and peritoneum: Secondary | ICD-10-CM

## 2017-03-04 DIAGNOSIS — K30 Functional dyspepsia: Secondary | ICD-10-CM | POA: Diagnosis not present

## 2017-03-04 DIAGNOSIS — C762 Malignant neoplasm of abdomen: Secondary | ICD-10-CM

## 2017-03-04 DIAGNOSIS — Z5112 Encounter for antineoplastic immunotherapy: Secondary | ICD-10-CM | POA: Diagnosis not present

## 2017-03-04 DIAGNOSIS — C5701 Malignant neoplasm of right fallopian tube: Secondary | ICD-10-CM

## 2017-03-04 DIAGNOSIS — Z803 Family history of malignant neoplasm of breast: Secondary | ICD-10-CM | POA: Diagnosis not present

## 2017-03-04 DIAGNOSIS — C801 Malignant (primary) neoplasm, unspecified: Secondary | ICD-10-CM

## 2017-03-04 DIAGNOSIS — G608 Other hereditary and idiopathic neuropathies: Secondary | ICD-10-CM

## 2017-03-04 DIAGNOSIS — Z808 Family history of malignant neoplasm of other organs or systems: Secondary | ICD-10-CM

## 2017-03-04 DIAGNOSIS — G620 Drug-induced polyneuropathy: Secondary | ICD-10-CM

## 2017-03-04 DIAGNOSIS — T451X5A Adverse effect of antineoplastic and immunosuppressive drugs, initial encounter: Secondary | ICD-10-CM

## 2017-03-04 LAB — CMP (CANCER CENTER ONLY)
ALT: 197 U/L — ABNORMAL HIGH (ref 0–55)
AST: 110 U/L — ABNORMAL HIGH (ref 5–34)
Albumin: 4.1 g/dL (ref 3.5–5.0)
Alkaline Phosphatase: 75 U/L (ref 40–150)
Anion gap: 8 (ref 3–11)
BUN: 9 mg/dL (ref 7–26)
CO2: 25 mmol/L (ref 22–29)
Calcium: 9.1 mg/dL (ref 8.4–10.4)
Chloride: 107 mmol/L (ref 98–109)
Creatinine: 0.66 mg/dL (ref 0.60–1.10)
GFR, Est AFR Am: >60 mL/min (ref >60)
GFR, Estimated: >60 mL/min (ref >60)
Glucose, Bld: 83 mg/dL (ref 70–140)
Potassium: 4.2 mmol/L (ref 3.3–4.7)
Sodium: 140 mmol/L (ref 136–145)
Total Bilirubin: 0.3 mg/dL (ref 0.2–1.2)
Total Protein: 7.1 g/dL (ref 6.4–8.3)

## 2017-03-04 LAB — CBC WITH DIFFERENTIAL/PLATELET
Basophils Absolute: 0 10*3/uL (ref 0.0–0.1)
Basophils Relative: 0 %
Eosinophils Absolute: 0 10*3/uL (ref 0.0–0.5)
Eosinophils Relative: 0 %
HCT: 31.4 % — ABNORMAL LOW (ref 34.8–46.6)
Hemoglobin: 10.5 g/dL — ABNORMAL LOW (ref 11.6–15.9)
Lymphocytes Relative: 46 %
Lymphs Abs: 1.3 10*3/uL (ref 0.9–3.3)
MCH: 34.5 pg — ABNORMAL HIGH (ref 25.1–34.0)
MCHC: 33.4 g/dL (ref 31.5–36.0)
MCV: 103.3 fL — ABNORMAL HIGH (ref 79.5–101.0)
Monocytes Absolute: 0.2 10*3/uL (ref 0.1–0.9)
Monocytes Relative: 7 %
Neutro Abs: 1.3 10*3/uL — ABNORMAL LOW (ref 1.5–6.5)
Neutrophils Relative %: 47 %
Platelets: 304 10*3/uL (ref 145–400)
RBC: 3.04 MIL/uL — ABNORMAL LOW (ref 3.70–5.45)
RDW: 13.2 % (ref 11.2–16.1)
WBC: 2.8 10*3/uL — ABNORMAL LOW (ref 3.9–10.3)

## 2017-03-04 LAB — UA PROTEIN, DIPSTICK - CHCC: Protein, ur: NEGATIVE mg/dL

## 2017-03-04 NOTE — Telephone Encounter (Signed)
Patient declined AVs and calendar of upcoming February appointments. Patient will receive update in MyChart.

## 2017-03-04 NOTE — Addendum Note (Signed)
Addended by: Chauncey Cruel on: 03/04/2017 10:10 AM   Modules accepted: Orders

## 2017-03-08 ENCOUNTER — Encounter: Payer: Self-pay | Admitting: Pharmacy Technician

## 2017-03-08 NOTE — Progress Notes (Signed)
Patient's insurance denied coverage of Nuelasta. I will meet with patient during infusion to sign [employer] drug assist application.

## 2017-03-09 ENCOUNTER — Inpatient Hospital Stay: Payer: 59

## 2017-03-09 VITALS — BP 122/78 | HR 58 | Temp 98.2°F | Resp 17

## 2017-03-09 DIAGNOSIS — C5701 Malignant neoplasm of right fallopian tube: Secondary | ICD-10-CM

## 2017-03-09 DIAGNOSIS — Z7189 Other specified counseling: Secondary | ICD-10-CM

## 2017-03-09 DIAGNOSIS — Z5112 Encounter for antineoplastic immunotherapy: Secondary | ICD-10-CM | POA: Diagnosis not present

## 2017-03-09 DIAGNOSIS — C762 Malignant neoplasm of abdomen: Secondary | ICD-10-CM

## 2017-03-09 DIAGNOSIS — C786 Secondary malignant neoplasm of retroperitoneum and peritoneum: Secondary | ICD-10-CM

## 2017-03-09 DIAGNOSIS — C801 Malignant (primary) neoplasm, unspecified: Principal | ICD-10-CM

## 2017-03-09 LAB — CMP (CANCER CENTER ONLY)
ALT: 89 U/L — ABNORMAL HIGH (ref 0–55)
AST: 35 U/L — ABNORMAL HIGH (ref 5–34)
Albumin: 4.5 g/dL (ref 3.5–5.0)
Alkaline Phosphatase: 75 U/L (ref 40–150)
Anion gap: 11 (ref 3–11)
BUN: 10 mg/dL (ref 7–26)
CO2: 24 mmol/L (ref 22–29)
Calcium: 9.6 mg/dL (ref 8.4–10.4)
Chloride: 104 mmol/L (ref 98–109)
Creatinine: 0.79 mg/dL (ref 0.60–1.10)
GFR, Est AFR Am: >60 mL/min (ref >60)
GFR, Estimated: >60 mL/min (ref >60)
Glucose, Bld: 82 mg/dL (ref 70–140)
Potassium: 4.4 mmol/L (ref 3.3–4.7)
Sodium: 139 mmol/L (ref 136–145)
Total Bilirubin: 0.4 mg/dL (ref 0.2–1.2)
Total Protein: 8.1 g/dL (ref 6.4–8.3)

## 2017-03-09 LAB — CBC WITH DIFFERENTIAL/PLATELET
Basophils Absolute: 0 10*3/uL (ref 0.0–0.1)
Basophils Relative: 0 %
Eosinophils Absolute: 0 10*3/uL (ref 0.0–0.5)
Eosinophils Relative: 1 %
HCT: 37.6 % (ref 34.8–46.6)
Hemoglobin: 12.5 g/dL (ref 11.6–15.9)
Lymphocytes Relative: 40 %
Lymphs Abs: 1.7 10*3/uL (ref 0.9–3.3)
MCH: 34.3 pg — ABNORMAL HIGH (ref 25.1–34.0)
MCHC: 33.2 g/dL (ref 31.5–36.0)
MCV: 103.3 fL — ABNORMAL HIGH (ref 79.5–101.0)
Monocytes Absolute: 0.6 10*3/uL (ref 0.1–0.9)
Monocytes Relative: 14 %
Neutro Abs: 1.9 10*3/uL (ref 1.5–6.5)
Neutrophils Relative %: 45 %
Platelets: 221 10*3/uL (ref 145–400)
RBC: 3.64 MIL/uL — ABNORMAL LOW (ref 3.70–5.45)
RDW: 13.8 % (ref 11.2–16.1)
WBC: 4.2 10*3/uL (ref 3.9–10.3)

## 2017-03-09 LAB — UA PROTEIN, DIPSTICK - CHCC: Protein, ur: NEGATIVE mg/dL

## 2017-03-09 MED ORDER — SODIUM CHLORIDE 0.9 % IV SOLN
Freq: Once | INTRAVENOUS | Status: AC
  Administered 2017-03-09: 10:00:00 via INTRAVENOUS

## 2017-03-09 MED ORDER — PEGFILGRASTIM 6 MG/0.6ML ~~LOC~~ PSKT
6.0000 mg | PREFILLED_SYRINGE | Freq: Once | SUBCUTANEOUS | Status: AC
  Administered 2017-03-09: 6 mg via SUBCUTANEOUS

## 2017-03-09 MED ORDER — PEGFILGRASTIM 6 MG/0.6ML ~~LOC~~ PSKT
PREFILLED_SYRINGE | SUBCUTANEOUS | Status: AC
  Filled 2017-03-09: qty 0.6

## 2017-03-09 MED ORDER — LORAZEPAM 2 MG/ML IJ SOLN
INTRAMUSCULAR | Status: AC
  Filled 2017-03-09: qty 1

## 2017-03-09 MED ORDER — PROCHLORPERAZINE MALEATE 10 MG PO TABS
10.0000 mg | ORAL_TABLET | Freq: Once | ORAL | Status: AC
  Administered 2017-03-09: 10 mg via ORAL

## 2017-03-09 MED ORDER — PROCHLORPERAZINE MALEATE 10 MG PO TABS
ORAL_TABLET | ORAL | Status: AC
  Filled 2017-03-09: qty 1

## 2017-03-09 MED ORDER — LORAZEPAM 2 MG/ML IJ SOLN
0.5000 mg | Freq: Once | INTRAMUSCULAR | Status: AC
  Administered 2017-03-09: 0.5 mg via INTRAVENOUS

## 2017-03-09 MED ORDER — SODIUM CHLORIDE 0.9 % IV SOLN
800.0000 mg/m2 | Freq: Once | INTRAVENOUS | Status: AC
  Administered 2017-03-09: 1254 mg via INTRAVENOUS
  Filled 2017-03-09: qty 32.98

## 2017-03-09 NOTE — Patient Instructions (Signed)
[facility] Discharge Instructions for Patients Receiving Chemotherapy  Today you received the following chemotherapy agents Gemzar To help prevent nausea and vomiting after your treatment, we encourage you to take your nausea medication as prescribed.  If you develop nausea and vomiting that is not controlled by your nausea medication, call the clinic.   BELOW ARE SYMPTOMS THAT SHOULD BE REPORTED IMMEDIATELY:  *FEVER GREATER THAN 100.5 F  *CHILLS WITH OR WITHOUT FEVER  NAUSEA AND VOMITING THAT IS NOT CONTROLLED WITH YOUR NAUSEA MEDICATION  *UNUSUAL SHORTNESS OF BREATH  *UNUSUAL BRUISING OR BLEEDING  TENDERNESS IN MOUTH AND THROAT WITH OR WITHOUT PRESENCE OF ULCERS  *URINARY PROBLEMS  *BOWEL PROBLEMS  UNUSUAL RASH Items with * indicate a potential emergency and should be followed up as soon as possible.  Feel free to call the clinic should you have any questions or concerns. The clinic phone number is [phone].  Please show the CHEMO ALERT CARD at check-in to the Emergency Department and triage nurse.   

## 2017-03-09 NOTE — Progress Notes (Signed)
Ok to treat with today's liver enzymes per Dr. Jana Hakim.  Cyndia Bent RN

## 2017-03-10 ENCOUNTER — Encounter (INDEPENDENT_AMBULATORY_CARE_PROVIDER_SITE_OTHER): Payer: Self-pay

## 2017-03-10 LAB — CA 125: Cancer Antigen (CA) 125: 528.5 U/mL — ABNORMAL HIGH (ref 0.0–38.1)

## 2017-03-17 ENCOUNTER — Other Ambulatory Visit: Payer: Self-pay | Admitting: Oncology

## 2017-03-17 ENCOUNTER — Encounter: Payer: Self-pay | Admitting: Oncology

## 2017-03-17 DIAGNOSIS — C5701 Malignant neoplasm of right fallopian tube: Secondary | ICD-10-CM

## 2017-03-17 DIAGNOSIS — C762 Malignant neoplasm of abdomen: Secondary | ICD-10-CM

## 2017-03-17 NOTE — Progress Notes (Signed)
Kathryn Ramsey continues to have symptoms in the upper abdomen.  I am going to obtain an MRI of the abdomen to look for peritoneal implants in that area.

## 2017-03-18 ENCOUNTER — Inpatient Hospital Stay (HOSPITAL_BASED_OUTPATIENT_CLINIC_OR_DEPARTMENT_OTHER): Payer: 59 | Admitting: Oncology

## 2017-03-18 ENCOUNTER — Inpatient Hospital Stay: Payer: 59 | Attending: Oncology

## 2017-03-18 ENCOUNTER — Other Ambulatory Visit: Payer: Self-pay

## 2017-03-18 ENCOUNTER — Other Ambulatory Visit: Payer: Self-pay | Admitting: Oncology

## 2017-03-18 ENCOUNTER — Inpatient Hospital Stay: Payer: 59

## 2017-03-18 VITALS — BP 107/61 | HR 60 | Temp 97.7°F | Resp 18

## 2017-03-18 DIAGNOSIS — C762 Malignant neoplasm of abdomen: Secondary | ICD-10-CM

## 2017-03-18 DIAGNOSIS — Z5111 Encounter for antineoplastic chemotherapy: Secondary | ICD-10-CM | POA: Insufficient documentation

## 2017-03-18 DIAGNOSIS — T451X5A Adverse effect of antineoplastic and immunosuppressive drugs, initial encounter: Principal | ICD-10-CM

## 2017-03-18 DIAGNOSIS — R530 Neoplastic (malignant) related fatigue: Secondary | ICD-10-CM | POA: Diagnosis not present

## 2017-03-18 DIAGNOSIS — C5701 Malignant neoplasm of right fallopian tube: Secondary | ICD-10-CM

## 2017-03-18 DIAGNOSIS — Z7189 Other specified counseling: Secondary | ICD-10-CM

## 2017-03-18 DIAGNOSIS — Z79899 Other long term (current) drug therapy: Secondary | ICD-10-CM | POA: Insufficient documentation

## 2017-03-18 DIAGNOSIS — Z85828 Personal history of other malignant neoplasm of skin: Secondary | ICD-10-CM | POA: Diagnosis not present

## 2017-03-18 DIAGNOSIS — Z5112 Encounter for antineoplastic immunotherapy: Secondary | ICD-10-CM | POA: Insufficient documentation

## 2017-03-18 DIAGNOSIS — C786 Secondary malignant neoplasm of retroperitoneum and peritoneum: Secondary | ICD-10-CM

## 2017-03-18 DIAGNOSIS — C801 Malignant (primary) neoplasm, unspecified: Principal | ICD-10-CM

## 2017-03-18 DIAGNOSIS — D701 Agranulocytosis secondary to cancer chemotherapy: Secondary | ICD-10-CM

## 2017-03-18 DIAGNOSIS — G620 Drug-induced polyneuropathy: Secondary | ICD-10-CM

## 2017-03-18 DIAGNOSIS — G608 Other hereditary and idiopathic neuropathies: Secondary | ICD-10-CM

## 2017-03-18 LAB — CBC WITH DIFFERENTIAL/PLATELET
Basophils Absolute: 0 10*3/uL (ref 0.0–0.1)
Basophils Relative: 0 %
Eosinophils Absolute: 0.1 10*3/uL (ref 0.0–0.5)
Eosinophils Relative: 1 %
HCT: 35.1 % (ref 34.8–46.6)
Hemoglobin: 11.6 g/dL (ref 11.6–15.9)
Lymphocytes Relative: 20 %
Lymphs Abs: 2.8 10*3/uL (ref 0.9–3.3)
MCH: 34.2 pg — ABNORMAL HIGH (ref 25.1–34.0)
MCHC: 33 g/dL (ref 31.5–36.0)
MCV: 103.5 fL — ABNORMAL HIGH (ref 79.5–101.0)
Monocytes Absolute: 0.9 10*3/uL (ref 0.1–0.9)
Monocytes Relative: 6 %
Neutro Abs: 10.6 10*3/uL — ABNORMAL HIGH (ref 1.5–6.5)
Neutrophils Relative %: 73 %
Platelets: 126 10*3/uL — ABNORMAL LOW (ref 145–400)
RBC: 3.39 MIL/uL — ABNORMAL LOW (ref 3.70–5.45)
RDW: 14 % (ref 11.2–14.5)
WBC: 14.5 10*3/uL — ABNORMAL HIGH (ref 3.9–10.3)

## 2017-03-18 LAB — CMP (CANCER CENTER ONLY)
ALT: 77 U/L — ABNORMAL HIGH (ref 0–55)
AST: 47 U/L — ABNORMAL HIGH (ref 5–34)
Albumin: 4.4 g/dL (ref 3.5–5.0)
Alkaline Phosphatase: 145 U/L (ref 40–150)
Anion gap: 11 (ref 3–11)
BUN: 9 mg/dL (ref 7–26)
CO2: 24 mmol/L (ref 22–29)
Calcium: 9.3 mg/dL (ref 8.4–10.4)
Chloride: 103 mmol/L (ref 98–109)
Creatinine: 0.72 mg/dL (ref 0.60–1.10)
GFR, Est AFR Am: >60 mL/min (ref >60)
GFR, Estimated: >60 mL/min (ref >60)
Glucose, Bld: 77 mg/dL (ref 70–140)
Potassium: 4 mmol/L (ref 3.5–5.1)
Sodium: 138 mmol/L (ref 136–145)
Total Bilirubin: 0.3 mg/dL (ref 0.2–1.2)
Total Protein: 7.8 g/dL (ref 6.4–8.3)

## 2017-03-18 LAB — UA PROTEIN, DIPSTICK - CHCC: Protein, ur: NEGATIVE mg/dL

## 2017-03-18 LAB — BILIRUBIN, DIRECT: Bilirubin, Direct: <0.1 mg/dL — ABNORMAL LOW (ref 0.1–0.5)

## 2017-03-18 MED ORDER — LORAZEPAM 2 MG/ML IJ SOLN
0.5000 mg | Freq: Once | INTRAMUSCULAR | Status: AC
  Administered 2017-03-18: 0.5 mg via INTRAVENOUS

## 2017-03-18 MED ORDER — SODIUM CHLORIDE 0.9 % IV SOLN
800.0000 mg/m2 | Freq: Once | INTRAVENOUS | Status: AC
  Administered 2017-03-18: 1254 mg via INTRAVENOUS
  Filled 2017-03-18: qty 32.98

## 2017-03-18 MED ORDER — PROCHLORPERAZINE MALEATE 10 MG PO TABS
10.0000 mg | ORAL_TABLET | Freq: Once | ORAL | Status: AC
  Administered 2017-03-18: 10 mg via ORAL

## 2017-03-18 MED ORDER — LORAZEPAM 2 MG/ML IJ SOLN
INTRAMUSCULAR | Status: AC
  Filled 2017-03-18: qty 1

## 2017-03-18 MED ORDER — SODIUM CHLORIDE 0.9 % IV SOLN
Freq: Once | INTRAVENOUS | Status: AC
  Administered 2017-03-18: 09:00:00 via INTRAVENOUS

## 2017-03-18 MED ORDER — SODIUM CHLORIDE 0.9 % IV SOLN
14.9000 mg/kg | Freq: Once | INTRAVENOUS | Status: AC
  Administered 2017-03-18: 800 mg via INTRAVENOUS
  Filled 2017-03-18: qty 32

## 2017-03-18 MED ORDER — PROCHLORPERAZINE MALEATE 10 MG PO TABS
ORAL_TABLET | ORAL | Status: AC
  Filled 2017-03-18: qty 1

## 2017-03-18 NOTE — Patient Instructions (Signed)
[facility] Discharge Instructions for Patients Receiving Chemotherapy  Today you received the following chemotherapy agents Avastin and Gemzar  To help prevent nausea and vomiting after your treatment, we encourage you to take your nausea medication as directed   If you develop nausea and vomiting that is not controlled by your nausea medication, call the clinic.   BELOW ARE SYMPTOMS THAT SHOULD BE REPORTED IMMEDIATELY:  *FEVER GREATER THAN 100.5 F  *CHILLS WITH OR WITHOUT FEVER  NAUSEA AND VOMITING THAT IS NOT CONTROLLED WITH YOUR NAUSEA MEDICATION  *UNUSUAL SHORTNESS OF BREATH  *UNUSUAL BRUISING OR BLEEDING  TENDERNESS IN MOUTH AND THROAT WITH OR WITHOUT PRESENCE OF ULCERS  *URINARY PROBLEMS  *BOWEL PROBLEMS  UNUSUAL RASH Items with * indicate a potential emergency and should be followed up as soon as possible.  Feel free to call the clinic should you have any questions or concerns. The clinic phone number is [phone].  Please show the CHEMO ALERT CARD at check-in to the Emergency Department and triage nurse.   

## 2017-03-18 NOTE — Progress Notes (Signed)
[name]  Telephone:([phone]) [phone] Fax:(336) [phone]     ID: Kathryn Ramsey DOB: [birth year]  MR#: [id number]  YTK#:[id number]  Patient Care Team: Harlan Stains, MD as PCP - General (Family Medicine) Gordy Levan, MD as Consulting Physician (Oncology) [name], MD as Consulting Physician (Obstetrics and Gynecology) Juanita Craver, MD as Consulting Physician (Gastroenterology) Servando Salina, MD as Consulting Physician (Obstetrics and Gynecology) Nickie Retort, MD as Consulting Physician (Urology) Gillis Ends, MD as Referring Physician (Obstetrics and Gynecology) OTHER MD: Festus Aloe [phone])  CHIEF COMPLAINT: Recurrent ovarian cancer  CURRENT TREATMENT: Gemcitabine and bevacizumab started 02/04/2017  INTERVAL HISTORY: Kathryn Ramsey returns today for a follow-up and treatment of her recurrent ovarian cancer accompanied by her husband [name]. Today is day 1 cycle 3 of bevacizumab given every 21 days and gemcitabine given on days 1, 8, of each cycle.    REVIEW OF SYSTEMS: Janmarie reports that she is very fatigued. She notes that she completes the same daily activities, but she feels more tired especially in the afternoon around 4 pm. She notes that for several days after chemotherapy treatment, she has chills and a low grade fever. She notes that it goes away after she eats lunch. She took Advil and Tylenol. She notes some tenderness in her epigastric area. She is taking Pepcid a couple times a day. She notes that it may have helped a little. She notes that her nails are sore. She reports some small nasal bleeding and raspiness in her voice. She notes that she will be resting this weekend.  HISTORY OF PRESENT ILLNESS: From Dr. Serita Grit original intake note 03/17/2015:  "Kathryn Ramsey is a very pleasant G4P4 who is seen in consultation at the request of Dr Collene Mares and Dr Garwin Brothers for peritoneal carcinomatosis. The patient has a history of a workup by  urology for gross hematuria which included a pelvic examine was suggested for extrinsic mass effect on the bladder on cystoscopy. Cystoscopy took place on in December 2016. The patient denies abdominal pain bloating early satiety or abdominal distention.  On 08/03/2015 at [facility] she underwent a CT scan of the abdomen and pelvis as ordered by Dr Baruch Gouty. This revealed a 1.4 cm low attenuation lesion on the posterior right hepatic lobe suggestive of a hemangioma. Status post hysterectomy. No ovarian masses. Moderate ascites. Omental caking seen in the lateral left abdomen and pelvis. Peritoneal nodularity in the left lateral pelvic cul-de-sac. No gross extrinsic compression on the bladder was identified on imaging.  The patient was then seen by her gastroenterologist, Dr. Collene Mares, who performed a colonoscopy which was unremarkable.  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 She was treated adjuvantly with carboplatin and paclitaxel, as detailed below. Her CA 125 dropped from 1226 on 03/20/2015 to 26.1 by 06/16/2015.  Her subsequent history is as detailed below.  I PAST MEDICAL HISTORY: Past Medical History:  Diagnosis Date  . Acute sensory neuropathy ([place]) 06/26/2015  . Allergy   . Anemia   . Asthma    illness induced asthma  . Blood transfusion without reported diagnosis    at age 52 years old D/T surgery to femur being crushed  . Complication of anesthesia  hx. 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 Heart murmur    pt, states had a "working heart murmur"  . History of kidney stones   . PONV  (postoperative nausea and vomiting)   . Skin cancer     PAST SURGICAL HISTORY: Past Surgical History:  Procedure Laterality Date  . 3 laporscopic proceedures    . ABDOMINAL HYSTERECTOMY     2010  . CHOLECYSTECTOMY     2010  . DILATION AND CURETTAGE OF UTERUS     1997  . LAPAROSCOPY N/A 03/27/2015   Procedure: DIAGNOSTIC LAPAROSCOPY ;  Surgeon: [name], MD;  Location: [facility];  Service: Gynecology;  Laterality: N/A;  . LAPAROSCOPY N/A 02/24/2016   Procedure: LAPAROSCOPY DIAGNOSTIC WITH PERITONEAL WASHINGS AND PERITONEAL BIOPSY;  Surgeon: [name], MD;  Location: [facility];  Service: Gynecology;  Laterality: N/A;  . LAPAROTOMY N/A 03/27/2015   Procedure: EXPLORATORY LAPAROTOMY, BILATERAL SALPINGO OOPHORECTOMY, OMENTECTOMY, RADICAL TUMOR DEBULKING;  Surgeon: [name], MD;  Location: [facility];  Service: Gynecology;  Laterality: N/A;  . sinus surgery 1994      FAMILY HISTORY Family History  Problem Relation Age of Onset  . Hypertension Father   . Skin cancer Father        nonmelanoma skin cancers in his late 22s  . Non-Hodgkin's lymphoma Maternal Aunt        dx. 34s; smoker  . Stroke Maternal Grandmother   . Other Mother        benign meningioma dx. early-mid-70s; hysterectomy in her late 19s for heavy periods - still has ovaries  . Other Son        one son with pre-cancerous skin findings  . Other Daughter        cysts on ovaries and hx of heavy periods  . Other Sister        hysterectomy for cysts - still has ovaries  . Heart attack Maternal Grandfather   . Heart attack Paternal Grandfather   . Renal cancer Maternal Aunt 60       smoker  . Breast cancer Maternal Aunt 78  . Other Maternal Aunt        dx. benign brain tumor (meningioma) at 64; 2nd benign brain tumor in her late 30s; hx of radical hysterectomy at age 31  . Brain cancer Other        NOS tumor  The patient's parents are both living, in their late 73s as of January 2018. The patient has one brother, 3 sisters.  There is no history of ovarian cancer in the family. One maternal aunt had breast and kidney cancer, another had non-Hodgkin's lymphoma.  GYNECOLOGIC HISTORY:  No LMP recorded. Patient has had a hysterectomy. 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 Her husband [name] is a Engineer, maintenance (IT). Their children are 71, 64, 91 and 72 y/o as of JAN 2018. The oldest works as an Electrical engineer in the [employer] in [place]. The other 3 children live in [place], the youngest currently attending [employer]. Rhe patient has no grandchildren. She attends [employer]    ADVANCED DIRECTIVES:    HEALTH MAINTENANCE: Social History   Tobacco Use  . Smoking status: Never Smoker  . Smokeless tobacco: Never Used  Substance Use Topics  . Alcohol use: Yes    Comment:  3 glasses  wine or beer/week  . Drug use: No     Colonoscopy:2016  PAP: s/p hyst  Bone density:   Allergies  Allergen Reactions  . Bee Venom Shortness Of Breath    swelling  . Sulfa Antibiotics Shortness Of Breath    Vomit , diarrhea , hives  . Iodinated Diagnostic Agents Other (See Comments)    01/27/17 Pt reports sneezing and congestion. Pt states she takes claritin as a premed for CT scan in [place]. Premedicated with Benadryl 25 mg PO. Post CT,no sneezing but had mild drainage/congestion which resolved after a few minutes with oral fluids.    Current Outpatient Medications  Medication Sig Dispense Refill  . famotidine (PEPCID) 20 MG tablet Take 20 mg by mouth daily.    [name] gabapentin (NEURONTIN) 100 MG capsule Take 1 capsule (100 mg total) by mouth at bedtime. Takes 128m at bedtime. Takes an additional 1080mduring the day if needed for nerve pain. 90 capsule 4  . ondansetron (ZOFRAN) 8 MG tablet Take 1 tablet (8 mg total) by mouth 2 (two) times daily as needed (Nausea or vomiting). 30 tablet 1   No current facility-administered  medications for this visit.    Facility-Administered Medications Ordered in Other Visits  Medication Dose Route Frequency Provider Last Rate Last Dose  . 0.9 %  sodium chloride infusion   Intravenous Once Magrinat, GuVirgie DadMD      . bevacizumab (AVASTIN) 825 mg in sodium chloride 0.9 % 100 mL chemo infusion  15 mg/kg (Order-Specific) Intravenous Once GuNicholas LoseMD      . gemcitabine (GEMZAR) 1,254 mg in sodium chloride 0.9 % 100 mL chemo infusion  800 mg/m2 (Order-Specific) Intravenous Once Magrinat, GuVirgie DadMD      . LORazepam (ATIVAN) injection 0.5 mg  0.5 mg Intravenous Once Magrinat, GuVirgie DadMD      . prochlorperazine (COMPAZINE) tablet 10 mg  10 mg Oral Once Magrinat, GuVirgie DadMD        OBJECTIVE: Middle-aged white woman evaluated in the treatment area  For today's vitals please see the infusion area follow-up sheet There were no vitals filed for this visit.   There is no height or weight on file to calculate BMI.    ECOG FS:1 - Symptomatic but completely ambulatory  Sclerae unicteric, EOMs intact No cervical or supraclavicular adenopathy Lungs no rales or rhonchi Heart regular rate and rhythm Abd soft, nontender, positive bowel sounds Neuro: nonfocal, well oriented, appropriate affect Breasts: Deferred   LAB RESULTS:  CMP     Component Value Date/Time   NA 138 03/18/2017 0754   NA 140 02/11/2017 0755   K 4.0 03/18/2017 0754   K 4.4 02/11/2017 0755   CL 103 03/18/2017 0754   CO2 24 03/18/2017 0754   CO2 26 02/11/2017 0755   GLUCOSE 77 03/18/2017 0754   GLUCOSE 93 02/11/2017 0755   BUN 9 03/18/2017 0754   BUN 7.9 02/11/2017 0755   CREATININE 0.7 02/11/2017 0755   CALCIUM 9.3 03/18/2017 0754   CALCIUM 9.6 02/11/2017 0755   PROT 7.8 03/18/2017 0754   PROT 7.5 02/11/2017 0755   ALBUMIN 4.4 03/18/2017 0754   ALBUMIN 4.4 02/11/2017 0755   AST 47 (H) 03/18/2017 0754   AST 27 02/11/2017 0755   ALT 77 (H) 03/18/2017 0754   ALT 24 02/11/2017 0755   ALKPHOS  145 03/18/2017 0754   ALKPHOS 58 02/11/2017 0755   BILITOT 0.3 03/18/2017 0754   BILITOT 0.43 02/11/2017 0755  GFRNONAA >60 03/18/2017 0754   GFRAA >60 03/18/2017 0754    INo results found for: SPEP, UPEP  Lab Results  Component Value Date   WBC 14.5 (H) 03/18/2017   NEUTROABS 10.6 (H) 03/18/2017   HGB 11.6 03/18/2017   HCT 35.1 03/18/2017   MCV 103.5 (H) 03/18/2017   PLT 126 (L) 03/18/2017      Chemistry      Component Value Date/Time   NA 138 03/18/2017 0754   NA 140 02/11/2017 0755   K 4.0 03/18/2017 0754   K 4.4 02/11/2017 0755   CL 103 03/18/2017 0754   CO2 24 03/18/2017 0754   CO2 26 02/11/2017 0755   BUN 9 03/18/2017 0754   BUN 7.9 02/11/2017 0755   CREATININE 0.7 02/11/2017 0755      Component Value Date/Time   CALCIUM 9.3 03/18/2017 0754   CALCIUM 9.6 02/11/2017 0755   ALKPHOS 145 03/18/2017 0754   ALKPHOS 58 02/11/2017 0755   AST 47 (H) 03/18/2017 0754   AST 27 02/11/2017 0755   ALT 77 (H) 03/18/2017 0754   ALT 24 02/11/2017 0755   BILITOT 0.3 03/18/2017 0754   BILITOT 0.43 02/11/2017 0755       No results found for: LABCA2  No components found for: LABCA125  No results for input(s): INR in the last 168 hours.  Urinalysis    Component Value Date/Time   COLORURINE YELLOW 03/29/2015 0920   APPEARANCEUR CLEAR 03/29/2015 0920   LABSPEC 1.005 10/09/2015 1239   PHURINE 6.0 10/09/2015 1239   PHURINE 7.5 03/29/2015 0920   GLUCOSEU Negative 10/09/2015 1239   HGBUR Negative 10/09/2015 1239   HGBUR SMALL (A) 03/29/2015 0920   BILIRUBINUR Negative 10/09/2015 1239   KETONESUR Negative 10/09/2015 1239   KETONESUR NEGATIVE 03/29/2015 0920   PROTEINUR NEGATIVE 03/18/2017 0755   UROBILINOGEN 0.2 10/09/2015 1239   NITRITE Negative 10/09/2015 1239   NITRITE NEGATIVE 03/29/2015 0920   LEUKOCYTESUR Negative 10/09/2015 1239     STUDIES: MRI of the abdomen is pending  ELIGIBLE FOR AVAILABLE RESEARCH PROTOCOL:  discussing protocols at  [place]: 52 y.o. [place] woman status post radical tumor debulking with optimal cytoreduction (R0) 03/27/2015 for a stage IIIC, high-grade right fallopian tube carcinoma  (a) baseline CA-125 was1226.  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)  (1) adjuvant chemotherapy consisted of carboplatin and paclitaxel for 6 doses, begun 04/14/2015, completed 08/11/2015  (a) paclitaxel was omitted from cycle 5 and dose reduced on cycle 6 because of neuropathy   (b) a "make up" dose of paclitaxel was given 08/25/2015  (c) last carboplatin dose was 08/11/2015  (d) CA-125 normalized by 06/16/2015   (2) FIRST RECURRENCE: January 2018  (a) CA-125 rise beginning November 2017 led to CT scans and PET scans December 2017, all negative  (b) exploratory laparotomy 02/24/2016 showed pathologically confirmed recurrence, with miliary disease involving all examined surfaces  (c) port-site metastases noted 03/08/2016  (3) carboplatin/liposomal doxorubicin started 03/05/2016, repeated every three weeks x4, completed 06/04/2016.  (a) cycle 3 delayed one week because of neutropenia; OnPro added  (4) RUCAPARIB started 07/02/2016  (a) restaging 10/01/2016: Normal CA 125, negative CT of the abdomen and pelvis  (b) labs 11/24/2016 shows a rise in her CA 125-43.4, with continuing rise thereafter  (c) rucaparib discontinued October 2018  (5) anastrozole started December 23, 2016-stopped after a  couple of weeks  SECOND RECURRENCE: (6) Gemcitabine/Bevacizumab started on 02/04/2017  PLAN Tarica is having some symptoms from her treatment including some fatigue, some nailbed discomfort, and minimal epistaxis.  She has mild elevation in her liver function tests.  None of this should keep [name] from continuing therapy, which is working if we can  trust the significant drop in the Ca125.  Accordingly we are continuing treatment today.  I reassured her that if she does lose the nail it will grow normally.  I also gave the go ahead for her to have some dental work if she needs it at this point.  I suggested in addition to the Pepcid she should have some Tums at home and if she gets the epigastric discomfort she gets intermittently she can take 2 tablets and if that clears within 10 minutes then she has done a "trial of therapy" and that would mean we are dealing a week chiefly with reflux and not with issues regarding peritoneal spread to the diaphragm.  Nevertheless I think an MRI of the abdomen as already ordered for next week I will be useful to [name].  She will see me again a week from now.  She knows to call for any other issues that may develop before then.    Magrinat, Virgie Dad, MD  03/18/17 9:20 AM Medical Oncology and Hematology [facility] [address] [place], [place] [address] Tel. [phone]    Fax. [phone]  This document serves as a record of services personally performed by Lurline Del, MD. It was created on his behalf by Sheron Nightingale, a trained medical scribe. The creation of this record is based on the scribe's personal observations and the provider's statements to them.   I have reviewed the above documentation for accuracy and completeness, and I agree with the above.

## 2017-03-22 NOTE — Progress Notes (Signed)
[place]  [phone]) [phone] Fax:(336) [phone]     ID: Kathryn Ramsey DOB: 10-18-1965  MR#: [id number]  CVE#:[id number]  Patient Care Team: Harlan Stains, MD as PCP - General (Family Medicine) Gordy Levan, MD as Consulting Physician (Oncology) [name], MD as Consulting Physician (Obstetrics and Gynecology) Juanita Craver, MD as Consulting Physician (Gastroenterology) Servando Salina, MD as Consulting Physician (Obstetrics and Gynecology) Nickie Retort, MD as Consulting Physician (Urology) Gillis Ends, MD as Referring Physician (Obstetrics and Gynecology) OTHER MD: Festus Aloe [phone])  CHIEF COMPLAINT: Recurrent ovarian cancer  CURRENT TREATMENT: Gemcitabine and bevacizumab started 02/04/2017  INTERVAL HISTORY: Kathryn Ramsey returns today for a follow-up and treatment of her recurrent ovarian cancer accompanied by her husband [name]. Today is day 8 cycle 3 of bevacizumab given every 21 days and gemcitabine given on days 1, 8, of each cycle. She reports that she is doing well overall. She reports that she has extreme fatigue and achiness following chemotherapy for several days. Due to the compazine and ativan, she lays on the couch on Friday and she takes it easy for the weekend.   She had a MRI abdomen on 03/24/17 with results of: IMPRESSION: No findings to suggest metastatic disease to the liver. 1.7 x 1.1 cm cavernous hemangioma in segment 6 of the liver. Small proteinaceous/hemorrhagic cysts in the left kidney.   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 On yesterday, she had diarrhea that has resolved on its own. She denies issues with bowel movements. She still intermittently has abdominal pain that she treats with Pepcid. She has mild nose bleeds that occur in the morning and she is not blowing her nose. She denies any other bleeding. She denies unusual headaches, visual changes, nausea,  vomiting, or dizziness. There has been no unusual cough, phlegm production, or pleurisy. This been no change in bowel or bladder habits. She denies unexplained fatigue or unexplained weight loss, bleeding, rash, or fever. A detailed review of systems was otherwise stable.     HISTORY OF PRESENT ILLNESS: From Dr. Serita Grit original intake note 03/17/2015:  "Kathryn Ramsey is a very pleasant G4P4 who is seen in consultation at the request of Dr Collene Mares and Dr Garwin Brothers for peritoneal carcinomatosis. The patient has a history of a workup by urology for gross hematuria which included a pelvic examine was suggested for extrinsic mass effect on the bladder on cystoscopy. Cystoscopy took place on in December 2016. The patient denies abdominal pain bloating early satiety or abdominal distention.  On 08/03/2015 at [facility] she underwent a CT scan of the abdomen and pelvis as ordered by Dr Baruch Gouty. This revealed a 1.4 cm low attenuation lesion on the posterior right hepatic lobe suggestive of a hemangioma. Status post hysterectomy. No ovarian masses. Moderate ascites. Omental caking seen in the lateral left abdomen and pelvis. Peritoneal nodularity in the left lateral pelvic cul-de-sac. No gross extrinsic compression on the bladder was identified on imaging.  The patient was then seen by her gastroenterologist, Dr. Collene Mares, who performed a colonoscopy which was unremarkable.  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 She was treated adjuvantly with carboplatin and paclitaxel, as  detailed below. Her CA 125 dropped from 1226 on 03/20/2015 to 26.1 by 06/16/2015.  Her  subsequent history is as detailed below.  I PAST MEDICAL HISTORY: Past Medical History:  Diagnosis Date  . Acute sensory neuropathy ([place]) 06/26/2015  . Allergy   . Anemia   . Asthma    illness induced asthma  . Blood transfusion without reported diagnosis    at age 62 years old D/T surgery to femur being crushed  . 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 Heart murmur    pt, states had a "working heart murmur"  . History of kidney stones   . PONV (postoperative nausea and vomiting)   . Skin cancer     PAST SURGICAL HISTORY: Past Surgical History:  Procedure Laterality Date  . 3 laporscopic proceedures    . ABDOMINAL HYSTERECTOMY     2010  . CHOLECYSTECTOMY     2010  . DILATION AND CURETTAGE OF UTERUS     1997  . LAPAROSCOPY N/A 03/27/2015   Procedure: DIAGNOSTIC LAPAROSCOPY ;  Surgeon: [name], MD;  Location: [facility];  Service: Gynecology;  Laterality: N/A;  . LAPAROSCOPY N/A 02/24/2016   Procedure: LAPAROSCOPY DIAGNOSTIC WITH PERITONEAL WASHINGS AND PERITONEAL BIOPSY;  Surgeon: [name], MD;  Location: [facility];  Service: Gynecology;  Laterality: N/A;  . LAPAROTOMY N/A 03/27/2015   Procedure: EXPLORATORY LAPAROTOMY, BILATERAL SALPINGO OOPHORECTOMY, OMENTECTOMY, RADICAL TUMOR DEBULKING;  Surgeon: [name], MD;  Location: [facility];  Service: Gynecology;  Laterality: N/A;  . sinus surgery 1994      FAMILY HISTORY Family History  Problem Relation Age of Onset  . Hypertension Father   . Skin cancer Father        nonmelanoma skin cancers in his late 74s  . Non-Hodgkin's lymphoma Maternal Aunt        dx. 12s; smoker  . Stroke Maternal Grandmother   . Other Mother        benign meningioma dx. early-mid-70s; hysterectomy in her late 37s for heavy periods - still has ovaries  . Other Son        one son with pre-cancerous skin findings  . Other Daughter        cysts on ovaries and hx of heavy periods  .  Other Sister        hysterectomy for cysts - still has ovaries  . Heart attack Maternal Grandfather   . Heart attack Paternal Grandfather   . Renal cancer Maternal Aunt 60       smoker  . Breast cancer Maternal Aunt 78  . Other Maternal Aunt        dx. benign brain tumor (meningioma) at 73; 2nd benign brain tumor in her late 19s; hx of radical hysterectomy at age 89  . Brain cancer Other        NOS tumor  The patient's parents are both living, in their late 13s as of January 2018. The patient has one brother, 3 sisters. There is no history of ovarian cancer in the family. One maternal aunt had breast and kidney cancer, another had non-Hodgkin's lymphoma.  GYNECOLOGIC HISTORY:  No LMP recorded. Patient has had a hysterectomy. 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 Her husband [name] is a Engineer, maintenance (IT). Their children are 62, 25,  70 and 68 y/o as of JAN 2018. The oldest works as an Electrical engineer in the [employer] in [place]. The other 3 children live in [place], the youngest currently attending [employer]. Rhe patient has no grandchildren. She attends [employer]    ADVANCED DIRECTIVES:    HEALTH MAINTENANCE: Social History   Tobacco Use  . Smoking status: Never Smoker  . Smokeless tobacco: Never Used  Substance Use Topics  . Alcohol use: Yes    Comment:  3 glasses wine or beer/week  . Drug use: No     Colonoscopy:2016  PAP: s/p hyst  Bone density:   Allergies  Allergen Reactions  . Bee Venom Shortness Of Breath    swelling  . Sulfa Antibiotics Shortness Of Breath    Vomit , diarrhea , hives  . Iodinated Diagnostic Agents Other (See Comments)    01/27/17 Pt reports sneezing and congestion. Pt states she takes claritin as a premed for CT scan in [place]. Premedicated with Benadryl 25 mg PO. Post CT,no sneezing but had mild drainage/congestion which resolved after a few  minutes with oral fluids.    Current Outpatient Medications  Medication Sig Dispense Refill  . famotidine (PEPCID) 20 MG tablet Take 20 mg by mouth daily.    [name] gabapentin (NEURONTIN) 100 MG capsule Take 1 capsule (100 mg total) by mouth at bedtime. Takes 125m at bedtime. Takes an additional 107mduring the day if needed for nerve pain. 90 capsule 4  . ondansetron (ZOFRAN) 8 MG tablet Take 1 tablet (8 mg total) by mouth 2 (two) times daily as needed (Nausea or vomiting). 30 tablet 1   No current facility-administered medications for this visit.     OBJECTIVE: Middle-aged white woman in no acute distress   Vitals:   03/25/17 0854  BP: 115/78  Pulse: 62  Resp: 18  Temp: 98.6 F (37 C)  SpO2: 100%     Body mass index is 20.97 kg/m.    ECOG FS:1 - Symptomatic but completely ambulatory  Sclerae unicteric, EOMs intact Oropharynx clear and moist No cervical or supraclavicular adenopathy Lungs no rales or rhonchi Heart regular rate and rhythm Abd soft, nontender, positive bowel sounds; no palpable liver edge MSK no focal spinal tenderness, no upper extremity lymphedema Neuro: nonfocal, well oriented, appropriate affect Breasts: Deferred  LAB RESULTS:  CMP     Component Value Date/Time   NA 137 03/25/2017 0832   NA 140 02/11/2017 0755   K 4.3 03/25/2017 0832   K 4.4 02/11/2017 0755   CL 104 03/25/2017 0832   CO2 23 03/25/2017 0832   CO2 26 02/11/2017 0755   GLUCOSE 88 03/25/2017 0832   GLUCOSE 93 02/11/2017 0755   BUN 10 03/25/2017 0832   BUN 7.9 02/11/2017 0755   CREATININE 0.72 03/25/2017 0832   CREATININE 0.7 02/11/2017 0755   CALCIUM 9.4 03/25/2017 0832   CALCIUM 9.6 02/11/2017 0755   PROT 7.6 03/25/2017 0832   PROT 7.5 02/11/2017 0755   ALBUMIN 4.3 03/25/2017 0832   ALBUMIN 4.4 02/11/2017 0755   AST 74 (H) 03/25/2017 0832   AST 27 02/11/2017 0755   ALT 155 (H) 03/25/2017 0832   ALT 24 02/11/2017 0755   ALKPHOS 96 03/25/2017 0832   ALKPHOS 58 02/11/2017  0755   BILITOT 0.4 03/25/2017 0832   BILITOT 0.43 02/11/2017 0755   GFRNONAA >60 03/25/2017 0832   GFRAA >60 03/25/2017 0832    INo results found for: SPEP, UPEP  Lab Results  Component  Value Date   WBC 3.7 (L) 03/25/2017   NEUTROABS 1.9 03/25/2017   HGB 11.5 (L) 03/25/2017   HCT 33.9 (L) 03/25/2017   MCV 100.0 03/25/2017   PLT 190 03/25/2017      Chemistry      Component Value Date/Time   NA 137 03/25/2017 0832   NA 140 02/11/2017 0755   K 4.3 03/25/2017 0832   K 4.4 02/11/2017 0755   CL 104 03/25/2017 0832   CO2 23 03/25/2017 0832   CO2 26 02/11/2017 0755   BUN 10 03/25/2017 0832   BUN 7.9 02/11/2017 0755   CREATININE 0.72 03/25/2017 0832   CREATININE 0.7 02/11/2017 0755      Component Value Date/Time   CALCIUM 9.4 03/25/2017 0832   CALCIUM 9.6 02/11/2017 0755   ALKPHOS 96 03/25/2017 0832   ALKPHOS 58 02/11/2017 0755   AST 74 (H) 03/25/2017 0832   AST 27 02/11/2017 0755   ALT 155 (H) 03/25/2017 0832   ALT 24 02/11/2017 0755   BILITOT 0.4 03/25/2017 0832   BILITOT 0.43 02/11/2017 0755       No results found for: LABCA2  No components found for: CVELF810  No results for input(s): INR in the last 168 hours.  Urinalysis    Component Value Date/Time   COLORURINE YELLOW 03/29/2015 0920   APPEARANCEUR CLEAR 03/29/2015 0920   LABSPEC 1.005 10/09/2015 1239   PHURINE 6.0 10/09/2015 1239   PHURINE 7.5 03/29/2015 0920   GLUCOSEU Negative 10/09/2015 1239   HGBUR Negative 10/09/2015 1239   HGBUR SMALL (A) 03/29/2015 0920   BILIRUBINUR Negative 10/09/2015 1239   KETONESUR Negative 10/09/2015 1239   KETONESUR NEGATIVE 03/29/2015 0920   PROTEINUR NEGATIVE 03/25/2017 0834   UROBILINOGEN 0.2 10/09/2015 1239   NITRITE Negative 10/09/2015 1239   NITRITE NEGATIVE 03/29/2015 0920   LEUKOCYTESUR Negative 10/09/2015 1239     STUDIES: She had a MRI abdomen on 03/24/17 with results of: IMPRESSION: No findings to suggest metastatic disease to the liver. 1.7 x 1.1 cm  cavernous hemangioma in segment 6 of the liver. Small proteinaceous/hemorrhagic cysts in the left kidney.  [name]   ELIGIBLE FOR AVAILABLE RESEARCH PROTOCOL:  discussing protocols at [facility]: 52 y.o. [place] woman status post radical tumor debulking with optimal cytoreduction (R0) 03/27/2015 for a stage IIIC, high-grade right fallopian tube carcinoma  (a) baseline CA-125 was1226.  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)  (1) adjuvant chemotherapy consisted of carboplatin and paclitaxel for 6 doses, begun 04/14/2015, completed 08/11/2015  (a) paclitaxel was omitted from cycle 5 and dose reduced on cycle 6 because of neuropathy   (b) a "make up" dose of paclitaxel was given 08/25/2015  (c) last carboplatin dose was 08/11/2015  (d) CA-125 normalized by 06/16/2015   (2) FIRST RECURRENCE: January 2018  (a) CA-125 rise beginning November 2017 led to CT scans and PET scans December 2017, all negative  (b) exploratory laparotomy 02/24/2016 showed pathologically confirmed recurrence, with miliary disease involving all examined surfaces  (c) port-site metastases noted 03/08/2016  (3) carboplatin/liposomal doxorubicin started 03/05/2016, repeated every three weeks x4, completed 06/04/2016.  (a) cycle 3 delayed one week because of neutropenia; OnPro added  (4) RUCAPARIB started 07/02/2016  (a) restaging 10/01/2016: Normal CA 125, negative CT of the abdomen and pelvis  (b) labs 11/24/2016 shows a rise in her CA 125-43.4, with continuing  rise thereafter  (c) rucaparib discontinued October 2018  (5) anastrozole started December 23, 2016-stopped after a couple of weeks  SECOND RECURRENCE: (6) Gemcitabine/Bevacizumab started on 02/04/2017  PLAN Kathryn Ramsey is tolerating treatment moderately well.  She does feel fatigued on  the weekend of treatment and she also has some bad days at times.  The effect is not enough on her quality of life at present that we may want to hold the chemo but at some point in the next couple of months very likely we will stop the Gemzar and continue the Avastin alone.  This is something that she is looking forward to.  She is continuing to show a drop in her Ca1 25.  Of course today's labs are still pending in that regard.  She has had a mild increase in her LFTs.  That is going to be rechecked today before treatment.  Otherwise she is doing remarkably well.  She will have her treatment again on 02 22 and then she will see me again 0301 for the beginning of her next cycle  She knows to call for any other issues that may develop before the next visit.   Magrinat, Virgie Dad, MD  03/25/17 9:36 AM Medical Oncology and Hematology [facility] [address] [place], [place] [address] Tel. [phone]    Fax. [phone]    This document serves as a record of services personally performed by Lurline Del, MD. It was created on his behalf by Steva Colder, a trained medical scribe. The creation of this record is based on the scribe's personal observations and the provider's statements to them.   I have reviewed the above documentation for accuracy and completeness, and I agree with the above.

## 2017-03-24 ENCOUNTER — Ambulatory Visit (HOSPITAL_COMMUNITY)
Admission: RE | Admit: 2017-03-24 | Discharge: 2017-03-24 | Disposition: A | Discharge status: Home / Self Care | Payer: 59 | Source: Ambulatory Visit | Attending: Oncology | Admitting: Oncology

## 2017-03-24 ENCOUNTER — Ambulatory Visit (HOSPITAL_COMMUNITY): Admission: RE | Admit: 2017-03-24 | Payer: 59 | Source: Ambulatory Visit | Admitting: Oncology

## 2017-03-24 DIAGNOSIS — N281 Cyst of kidney, acquired: Secondary | ICD-10-CM | POA: Insufficient documentation

## 2017-03-24 DIAGNOSIS — C762 Malignant neoplasm of abdomen: Secondary | ICD-10-CM | POA: Insufficient documentation

## 2017-03-24 DIAGNOSIS — C5701 Malignant neoplasm of right fallopian tube: Secondary | ICD-10-CM | POA: Diagnosis present

## 2017-03-24 DIAGNOSIS — D1809 Hemangioma of other sites: Secondary | ICD-10-CM | POA: Diagnosis not present

## 2017-03-24 MED ORDER — GADOBENATE DIMEGLUMINE 529 MG/ML IV SOLN
15.0000 mL | Freq: Once | INTRAVENOUS | Status: AC | PRN
  Administered 2017-03-24: 11 mL via INTRAVENOUS

## 2017-03-25 ENCOUNTER — Inpatient Hospital Stay: Payer: 59

## 2017-03-25 ENCOUNTER — Inpatient Hospital Stay (HOSPITAL_BASED_OUTPATIENT_CLINIC_OR_DEPARTMENT_OTHER): Payer: 59 | Admitting: Oncology

## 2017-03-25 VITALS — BP 115/78 | HR 62 | Temp 98.6°F | Resp 18 | Ht 63.0 in | Wt 118.4 lb

## 2017-03-25 DIAGNOSIS — C786 Secondary malignant neoplasm of retroperitoneum and peritoneum: Secondary | ICD-10-CM

## 2017-03-25 DIAGNOSIS — C5701 Malignant neoplasm of right fallopian tube: Secondary | ICD-10-CM

## 2017-03-25 DIAGNOSIS — C762 Malignant neoplasm of abdomen: Secondary | ICD-10-CM

## 2017-03-25 DIAGNOSIS — Z5112 Encounter for antineoplastic immunotherapy: Secondary | ICD-10-CM | POA: Diagnosis not present

## 2017-03-25 DIAGNOSIS — Z85828 Personal history of other malignant neoplasm of skin: Secondary | ICD-10-CM

## 2017-03-25 DIAGNOSIS — C801 Malignant (primary) neoplasm, unspecified: Secondary | ICD-10-CM

## 2017-03-25 DIAGNOSIS — R530 Neoplastic (malignant) related fatigue: Secondary | ICD-10-CM

## 2017-03-25 DIAGNOSIS — Z7189 Other specified counseling: Secondary | ICD-10-CM

## 2017-03-25 DIAGNOSIS — Z79899 Other long term (current) drug therapy: Secondary | ICD-10-CM

## 2017-03-25 LAB — CBC WITH DIFFERENTIAL/PLATELET
Basophils Absolute: 0 10*3/uL (ref 0.0–0.1)
Basophils Relative: 1 %
Eosinophils Absolute: 0 10*3/uL (ref 0.0–0.5)
Eosinophils Relative: 1 %
HCT: 33.9 % — ABNORMAL LOW (ref 34.8–46.6)
Hemoglobin: 11.5 g/dL — ABNORMAL LOW (ref 11.6–15.9)
Lymphocytes Relative: 40 %
Lymphs Abs: 1.5 10*3/uL (ref 0.9–3.3)
MCH: 33.9 pg (ref 25.1–34.0)
MCHC: 33.9 g/dL (ref 31.5–36.0)
MCV: 100 fL (ref 79.5–101.0)
Monocytes Absolute: 0.3 10*3/uL (ref 0.1–0.9)
Monocytes Relative: 8 %
Neutro Abs: 1.9 10*3/uL (ref 1.5–6.5)
Neutrophils Relative %: 50 %
Platelets: 190 10*3/uL (ref 145–400)
RBC: 3.39 MIL/uL — ABNORMAL LOW (ref 3.70–5.45)
RDW: 13.7 % (ref 11.2–14.5)
WBC: 3.7 10*3/uL — ABNORMAL LOW (ref 3.9–10.3)

## 2017-03-25 LAB — CMP (CANCER CENTER ONLY)
ALT: 155 U/L — ABNORMAL HIGH (ref 0–55)
AST: 74 U/L — ABNORMAL HIGH (ref 5–34)
Albumin: 4.3 g/dL (ref 3.5–5.0)
Alkaline Phosphatase: 96 U/L (ref 40–150)
Anion gap: 10 (ref 3–11)
BUN: 10 mg/dL (ref 7–26)
CO2: 23 mmol/L (ref 22–29)
Calcium: 9.4 mg/dL (ref 8.4–10.4)
Chloride: 104 mmol/L (ref 98–109)
Creatinine: 0.72 mg/dL (ref 0.60–1.10)
GFR, Est AFR Am: >60 mL/min (ref >60)
GFR, Estimated: >60 mL/min (ref >60)
Glucose, Bld: 88 mg/dL (ref 70–140)
Potassium: 4.3 mmol/L (ref 3.5–5.1)
Sodium: 137 mmol/L (ref 136–145)
Total Bilirubin: 0.4 mg/dL (ref 0.2–1.2)
Total Protein: 7.6 g/dL (ref 6.4–8.3)

## 2017-03-25 LAB — UA PROTEIN, DIPSTICK - CHCC: Protein, ur: NEGATIVE mg/dL

## 2017-03-25 MED ORDER — PEGFILGRASTIM 6 MG/0.6ML ~~LOC~~ PSKT: 6.0000 mg | PREFILLED_SYRINGE | Freq: Once | SUBCUTANEOUS | Status: DC

## 2017-03-25 MED ORDER — SODIUM CHLORIDE 0.9 % IV SOLN
Freq: Once | INTRAVENOUS | Status: AC
  Administered 2017-03-25: 10:00:00 via INTRAVENOUS

## 2017-03-25 MED ORDER — PROCHLORPERAZINE MALEATE 10 MG PO TABS
ORAL_TABLET | ORAL | Status: AC
  Filled 2017-03-25: qty 1

## 2017-03-25 MED ORDER — LORAZEPAM 1 MG PO TABS
ORAL_TABLET | ORAL | Status: AC
  Filled 2017-03-25: qty 1

## 2017-03-25 MED ORDER — PROCHLORPERAZINE MALEATE 10 MG PO TABS
10.0000 mg | ORAL_TABLET | Freq: Once | ORAL | Status: AC
  Administered 2017-03-25: 10 mg via ORAL

## 2017-03-25 MED ORDER — LORAZEPAM 2 MG/ML IJ SOLN
0.5000 mg | Freq: Once | INTRAMUSCULAR | Status: AC
  Administered 2017-03-25: 0.5 mg via INTRAVENOUS

## 2017-03-25 MED ORDER — SODIUM CHLORIDE 0.9 % IV SOLN
800.0000 mg/m2 | Freq: Once | INTRAVENOUS | Status: AC
  Administered 2017-03-25: 1254 mg via INTRAVENOUS
  Filled 2017-03-25: qty 32.98

## 2017-03-25 MED ORDER — LORAZEPAM 2 MG/ML IJ SOLN
INTRAMUSCULAR | Status: AC
  Filled 2017-03-25: qty 1

## 2017-03-25 NOTE — Progress Notes (Signed)
Per Dr. Jana Hakim, ok to proceed with treatment with current labs. Per Ellwood Dense, RN, Dr. Jana Hakim advised to hold Neulasta for this treatment. Patient aware.

## 2017-03-25 NOTE — Patient Instructions (Signed)
[facility] °Discharge Instructions for Patients Receiving Chemotherapy ° °Today you received the following chemotherapy agents Gemzar ° °To help prevent nausea and vomiting after your treatment, we encourage you to take your nausea medication as directed. °  °If you develop nausea and vomiting that is not controlled by your nausea medication, call the clinic.  ° °BELOW ARE SYMPTOMS THAT SHOULD BE REPORTED IMMEDIATELY: °· *FEVER GREATER THAN 100.5 F °· *CHILLS WITH OR WITHOUT FEVER °· NAUSEA AND VOMITING THAT IS NOT CONTROLLED WITH YOUR NAUSEA MEDICATION °· *UNUSUAL SHORTNESS OF BREATH °· *UNUSUAL BRUISING OR BLEEDING °· TENDERNESS IN MOUTH AND THROAT WITH OR WITHOUT PRESENCE OF ULCERS °· *URINARY PROBLEMS °· *BOWEL PROBLEMS °· UNUSUAL RASH °Items with * indicate a potential emergency and should be followed up as soon as possible. ° °Feel free to call the clinic should you have any questions or concerns. The clinic phone number is [phone]. ° °Please show the CHEMO ALERT CARD at check-in to the Emergency Department and triage nurse. ° ° °

## 2017-03-26 LAB — CA 125: Cancer Antigen (CA) 125: 526.7 U/mL — ABNORMAL HIGH (ref 0.0–38.1)

## 2017-03-28 ENCOUNTER — Telehealth: Payer: Self-pay | Admitting: Oncology

## 2017-03-28 NOTE — Telephone Encounter (Signed)
Per 2/8 no los °

## 2017-04-01 ENCOUNTER — Other Ambulatory Visit: Payer: Self-pay | Admitting: Oncology

## 2017-04-01 DIAGNOSIS — Z1231 Encounter for screening mammogram for malignant neoplasm of breast: Secondary | ICD-10-CM

## 2017-04-07 ENCOUNTER — Telehealth: Payer: Self-pay | Admitting: Oncology

## 2017-04-07 ENCOUNTER — Other Ambulatory Visit: Payer: Self-pay | Admitting: Oncology

## 2017-04-07 ENCOUNTER — Inpatient Hospital Stay: Payer: 59

## 2017-04-07 DIAGNOSIS — C786 Secondary malignant neoplasm of retroperitoneum and peritoneum: Secondary | ICD-10-CM

## 2017-04-07 DIAGNOSIS — C5701 Malignant neoplasm of right fallopian tube: Secondary | ICD-10-CM

## 2017-04-07 DIAGNOSIS — C762 Malignant neoplasm of abdomen: Secondary | ICD-10-CM

## 2017-04-07 DIAGNOSIS — Z5112 Encounter for antineoplastic immunotherapy: Secondary | ICD-10-CM | POA: Diagnosis not present

## 2017-04-07 DIAGNOSIS — C801 Malignant (primary) neoplasm, unspecified: Secondary | ICD-10-CM

## 2017-04-07 LAB — COMPREHENSIVE METABOLIC PANEL
ALT: 45 U/L (ref 0–55)
AST: 32 U/L (ref 5–34)
Albumin: 4.4 g/dL (ref 3.5–5.0)
Alkaline Phosphatase: 80 U/L (ref 40–150)
Anion gap: 11 (ref 3–11)
BUN: 8 mg/dL (ref 7–26)
CO2: 24 mmol/L (ref 22–29)
Calcium: 9.8 mg/dL (ref 8.4–10.4)
Chloride: 98 mmol/L (ref 98–109)
Creatinine, Ser: 0.77 mg/dL (ref 0.60–1.10)
GFR calc Af Amer: >60 mL/min (ref >60)
GFR calc non Af Amer: >60 mL/min (ref >60)
Glucose, Bld: 91 mg/dL (ref 70–140)
Potassium: 4.3 mmol/L (ref 3.5–5.1)
Sodium: 133 mmol/L — ABNORMAL LOW (ref 136–145)
Total Bilirubin: 0.7 mg/dL (ref 0.2–1.2)
Total Protein: 7.9 g/dL (ref 6.4–8.3)

## 2017-04-07 LAB — CBC WITH DIFFERENTIAL/PLATELET
Basophils Absolute: 0 10*3/uL (ref 0.0–0.1)
Basophils Relative: 1 %
Eosinophils Absolute: 0.1 10*3/uL (ref 0.0–0.5)
Eosinophils Relative: 2 %
HCT: 35.5 % (ref 34.8–46.6)
Hemoglobin: 11.8 g/dL (ref 11.6–15.9)
Lymphocytes Relative: 20 %
Lymphs Abs: 0.9 10*3/uL (ref 0.9–3.3)
MCH: 33.2 pg (ref 25.1–34.0)
MCHC: 33.2 g/dL (ref 31.5–36.0)
MCV: 100.1 fL (ref 79.5–101.0)
Monocytes Absolute: 0.5 10*3/uL (ref 0.1–0.9)
Monocytes Relative: 11 %
Neutro Abs: 2.8 10*3/uL (ref 1.5–6.5)
Neutrophils Relative %: 66 %
Platelets: 361 10*3/uL (ref 145–400)
RBC: 3.54 MIL/uL — ABNORMAL LOW (ref 3.70–5.45)
RDW: 15.5 % — ABNORMAL HIGH (ref 11.2–14.5)
WBC: 4.3 10*3/uL (ref 3.9–10.3)

## 2017-04-07 LAB — TOTAL PROTEIN, URINE DIPSTICK: Protein, ur: NEGATIVE mg/dL

## 2017-04-07 NOTE — Progress Notes (Unsigned)
Kathryn Ramsey was feeling poorly and was sure there was something wrong.  We went ahead and got labs today but everything is looking fine.  Nevertheless were going to hold the Gemzar tomorrow.  She will receive Avastin alone.  We may or may not resume the Gemzar on 0301 when she returns to see me.

## 2017-04-07 NOTE — Telephone Encounter (Signed)
Spoke with patient and scheduled appt per 2/21 sch msg.

## 2017-04-08 ENCOUNTER — Inpatient Hospital Stay: Payer: 59

## 2017-04-08 ENCOUNTER — Other Ambulatory Visit: Payer: Self-pay

## 2017-04-08 ENCOUNTER — Ambulatory Visit (HOSPITAL_BASED_OUTPATIENT_CLINIC_OR_DEPARTMENT_OTHER): Payer: 59 | Admitting: Oncology

## 2017-04-08 VITALS — BP 116/71 | HR 75 | Temp 99.6°F | Resp 17 | Wt 119.5 lb

## 2017-04-08 DIAGNOSIS — Z7189 Other specified counseling: Secondary | ICD-10-CM

## 2017-04-08 DIAGNOSIS — R530 Neoplastic (malignant) related fatigue: Secondary | ICD-10-CM

## 2017-04-08 DIAGNOSIS — C5701 Malignant neoplasm of right fallopian tube: Secondary | ICD-10-CM | POA: Diagnosis not present

## 2017-04-08 DIAGNOSIS — Z5112 Encounter for antineoplastic immunotherapy: Secondary | ICD-10-CM | POA: Diagnosis not present

## 2017-04-08 DIAGNOSIS — T451X5A Adverse effect of antineoplastic and immunosuppressive drugs, initial encounter: Secondary | ICD-10-CM

## 2017-04-08 DIAGNOSIS — C786 Secondary malignant neoplasm of retroperitoneum and peritoneum: Secondary | ICD-10-CM

## 2017-04-08 DIAGNOSIS — C801 Malignant (primary) neoplasm, unspecified: Principal | ICD-10-CM

## 2017-04-08 DIAGNOSIS — D701 Agranulocytosis secondary to cancer chemotherapy: Secondary | ICD-10-CM

## 2017-04-08 LAB — URINALYSIS, COMPLETE (UACMP) WITH MICROSCOPIC
Bilirubin Urine: NEGATIVE
Glucose, UA: NEGATIVE mg/dL
Hgb urine dipstick: NEGATIVE
Ketones, ur: NEGATIVE mg/dL
Leukocytes, UA: NEGATIVE
Nitrite: NEGATIVE
Protein, ur: NEGATIVE mg/dL
RBC / HPF: NONE SEEN RBC/hpf (ref 0–5)
Specific Gravity, Urine: 1.003 — ABNORMAL LOW (ref 1.005–1.030)
WBC, UA: NONE SEEN WBC/hpf (ref 0–5)
pH: 8 (ref 5.0–8.0)

## 2017-04-08 LAB — INFLUENZA PANEL BY PCR (TYPE A & B)
Influenza A By PCR: NEGATIVE
Influenza B By PCR: NEGATIVE

## 2017-04-08 LAB — CA 125: Cancer Antigen (CA) 125: 411.5 U/mL — ABNORMAL HIGH (ref 0.0–38.1)

## 2017-04-08 MED ORDER — SODIUM CHLORIDE 0.9 % IV SOLN
Freq: Once | INTRAVENOUS | Status: AC
  Administered 2017-04-08: 10:00:00 via INTRAVENOUS

## 2017-04-08 MED ORDER — CEPHALEXIN 500 MG PO CAPS: 500.0000 mg | ORAL_CAPSULE | Freq: Two times a day (BID) | ORAL | 0 refills | Status: DC

## 2017-04-08 MED ORDER — SODIUM CHLORIDE 0.9 % IV SOLN
800.0000 mg | Freq: Once | INTRAVENOUS | Status: AC
  Administered 2017-04-08: 800 mg via INTRAVENOUS
  Filled 2017-04-08: qty 32

## 2017-04-08 NOTE — Patient Instructions (Signed)
[facility] Discharge Instructions for Patients Receiving Chemotherapy  Today you received the following chemotherapy agents Avastin  To help prevent nausea and vomiting after your treatment, we encourage you to take your nausea medication as directed   If you develop nausea and vomiting that is not controlled by your nausea medication, call the clinic.   BELOW ARE SYMPTOMS THAT SHOULD BE REPORTED IMMEDIATELY:  *FEVER GREATER THAN 100.5 F  *CHILLS WITH OR WITHOUT FEVER  NAUSEA AND VOMITING THAT IS NOT CONTROLLED WITH YOUR NAUSEA MEDICATION  *UNUSUAL SHORTNESS OF BREATH  *UNUSUAL BRUISING OR BLEEDING  TENDERNESS IN MOUTH AND THROAT WITH OR WITHOUT PRESENCE OF ULCERS  *URINARY PROBLEMS  *BOWEL PROBLEMS  UNUSUAL RASH Items with * indicate a potential emergency and should be followed up as soon as possible.  Feel free to call the clinic should you have any questions or concerns. The clinic phone number is [phone].  Please show the CHEMO ALERT CARD at check-in to the Emergency Department and triage nurse.   

## 2017-04-08 NOTE — Progress Notes (Signed)
Received verbal orders from Dr Jana Hakim:  U/A, culture, and flu swab. Infusion RN aware.

## 2017-04-08 NOTE — Progress Notes (Signed)
Dr. Jana Hakim made aware of patient's given assessment. MD visited with patient in infusion area. Orders given. Patient reports generally not feeling well since last weekend.

## 2017-04-08 NOTE — Progress Notes (Signed)
[facility]  [phone] Fax:(336) [phone]     ID: Kathryn Ramsey DOB: [birth year]  MR#: [id number]  QZR#:[id number]  Patient Care Team: Harlan Stains, MD as PCP - General (Family Medicine) Gordy Levan, MD as Consulting Physician (Oncology) [name], MD as Consulting Physician (Obstetrics and Gynecology) Juanita Craver, MD as Consulting Physician (Gastroenterology) Servando Salina, MD as Consulting Physician (Obstetrics and Gynecology) Nickie Retort, MD as Consulting Physician (Urology) Gillis Ends, MD as Referring Physician (Obstetrics and Gynecology) OTHER MD: Festus Aloe [phone])  CHIEF COMPLAINT: Recurrent ovarian cancer  CURRENT TREATMENT: Gemcitabine and bevacizumab started 02/04/2017  INTERVAL HISTORY: [name] returns today for a follow-up and treatment of her recurrent ovarian cancer accompanied by her husand. Today is day 1 cycle 4 of Bevacizumab given every 21 days. She tolerates this well.  She also receives gemcitabine on days 1 and 8 of each 28 day cycle. She also tolerates this well.  However she has been feeling very poorly for the last several days.  She has had fevers slightly over 100 degrees.  He has had significant malaise and body aches.  Has occasional headaches.  There has been no mouth sores, no nausea or vomiting, no problems with access points, and no diarrhea or constipation.  There has been mild dysuria and mild problems with hemorrhoids.   She had a MRI abdomen on 03/24/17 with results of: IMPRESSION: No findings to suggest metastatic disease to the liver. 1.7 x 1.1 cm cavernous hemangioma in segment 6 of the liver. Small proteinaceous/hemorrhagic cysts in the left kidney.    REVIEW OF SYSTEMS: [name] reports that she isn't feeling well, and she has been getting fevers. She notes that she takes tylenol with minimal relief. She notes that she has a hemorid. She notes a painful twinge in the right  side of her abdomen. She reports a moderate headache along with slight nausea. She denies oral sores. She notes that she feels aches in her legs and feet.  She also notes dysuria. She notes that she is allergic to sulfur. She denies unusual headaches, visual changes, nausea, vomiting, or dizziness. There has been no unusual cough, phlegm production, or pleurisy. This been no change in bowel or bladder habits. She denies unexplained fatigue or unexplained weight loss, bleeding, rash, or fever. A detailed review of systems was otherwise stable.     HISTORY OF PRESENT ILLNESS: From Dr. Serita Grit original intake note 03/17/2015:  "Kathryn Ramsey is a very pleasant G4P4 who is seen in consultation at the request of Dr Collene Mares and Dr Garwin Brothers for peritoneal carcinomatosis. The patient has a history of a workup by urology for gross hematuria which included a pelvic examine was suggested for extrinsic mass effect on the bladder on cystoscopy. Cystoscopy took place on in December 2016. The patient denies abdominal pain bloating early satiety or abdominal distention.  On 08/03/2015 at [facility] she underwent a CT scan of the abdomen and pelvis as ordered by Dr Baruch Gouty. This revealed a 1.4 cm low attenuation lesion on the posterior right hepatic lobe suggestive of a hemangioma. Status post hysterectomy. No ovarian masses. Moderate ascites. Omental caking seen in the lateral left abdomen and pelvis. Peritoneal nodularity in the left lateral pelvic cul-de-sac. No gross extrinsic compression on the bladder was identified on imaging.  The patient was then seen by her gastroenterologist, Dr. Collene Mares, who performed a colonoscopy which was unremarkable.  Tumor markers were drawn on 08/04/2015 and these included a CA-125 that was  elevated to 957, and a CEA that was normal at 1."  On 03/27/2015 the patient underwent diagnostic laparoscopy, exploratory laparotomy with bilateral salpingo-oophorectomy, omentectomy, and  radical tumor debulking with optimal cytoreduction (R0) of what proved to be a stage IIIc right fallopian tube cancer. She was treated adjuvantly with carboplatin and paclitaxel, as detailed below. Her CA 125 dropped from 1226 on 03/20/2015 to 26.1 by 06/16/2015.  Her subsequent history is as detailed below.  I PAST MEDICAL HISTORY: Past Medical History:  Diagnosis Date  . Acute sensory neuropathy ([place]) 06/26/2015  . Allergy   . Anemia   . Asthma    illness induced asthma  . Blood transfusion without reported diagnosis    at age 87 years old D/T surgery to femur being crushed  . 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 Heart murmur    pt, states had a "working heart murmur"  . History of kidney stones   . PONV (postoperative nausea and vomiting)   . Skin cancer     PAST SURGICAL HISTORY: Past Surgical History:  Procedure Laterality Date  . 3 laporscopic proceedures    . ABDOMINAL HYSTERECTOMY     2010  . CHOLECYSTECTOMY     2010  . DILATION AND CURETTAGE OF UTERUS     1997  . LAPAROSCOPY N/A 03/27/2015   Procedure: DIAGNOSTIC LAPAROSCOPY ;  Surgeon: [name], MD;  Location: [facility];  Service: Gynecology;  Laterality: N/A;  . LAPAROSCOPY N/A 02/24/2016   Procedure: LAPAROSCOPY DIAGNOSTIC WITH PERITONEAL WASHINGS AND PERITONEAL BIOPSY;  Surgeon: [name], MD;  Location: [facility];  Service: Gynecology;  Laterality: N/A;  . LAPAROTOMY N/A 03/27/2015   Procedure: EXPLORATORY LAPAROTOMY, BILATERAL SALPINGO OOPHORECTOMY, OMENTECTOMY, RADICAL TUMOR DEBULKING;  Surgeon: [name], MD;  Location: [facility];  Service: Gynecology;  Laterality: N/A;  . sinus surgery 1994      FAMILY HISTORY Family History  Problem Relation Age of Onset  . Hypertension Father   . Skin cancer Father        nonmelanoma skin cancers in his late 12s  . Non-Hodgkin's lymphoma Maternal Aunt        dx. 35s; smoker  . Stroke Maternal  Grandmother   . Other Mother        benign meningioma dx. early-mid-70s; hysterectomy in her late 1s for heavy periods - still has ovaries  . Other Son        one son with pre-cancerous skin findings  . Other Daughter        cysts on ovaries and hx of heavy periods  . Other Sister        hysterectomy for cysts - still has ovaries  . Heart attack Maternal Grandfather   . Heart attack Paternal Grandfather   . Renal cancer Maternal Aunt 60       smoker  . Breast cancer Maternal Aunt 78  . Other Maternal Aunt        dx. benign brain tumor (meningioma) at 63; 2nd benign brain tumor in her late 8s; hx of radical hysterectomy at age 78  . Brain cancer Other        NOS tumor  The patient's parents are both living, in their late 43s as of January 2018. The patient has one brother, 3 sisters. There is no history of ovarian cancer in the family. One maternal aunt had breast and kidney cancer,  another had non-Hodgkin's lymphoma.  GYNECOLOGIC HISTORY:  No LMP recorded. Patient has had a hysterectomy. 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 Her husband [name] is a Engineer, maintenance (IT). Their children are 45, 98, 76 and 76 y/o as of JAN 2018. The oldest works as an Electrical engineer in the [employer] in [place]. The other 3 children live in [place], the youngest currently attending [employer]. Rhe patient has no grandchildren. She attends [employer]    ADVANCED DIRECTIVES:    HEALTH MAINTENANCE: Social History   Tobacco Use  . Smoking status: Never Smoker  . Smokeless tobacco: Never Used  Substance Use Topics  . Alcohol use: Yes    Comment:  3 glasses wine or beer/week  . Drug use: No     Colonoscopy:2016  PAP: s/p hyst  Bone density:   Allergies  Allergen Reactions  . Bee Venom Shortness Of Breath    swelling  . Sulfa Antibiotics Shortness Of Breath    Vomit , diarrhea , hives  .  Iodinated Diagnostic Agents Other (See Comments)    01/27/17 Pt reports sneezing and congestion. Pt states she takes claritin as a premed for CT scan in [place]. Premedicated with Benadryl 25 mg PO. Post CT,no sneezing but had mild drainage/congestion which resolved after a few minutes with oral fluids.    Current Outpatient Medications  Medication Sig Dispense Refill  . famotidine (PEPCID) 20 MG tablet Take 20 mg by mouth daily.    [name] gabapentin (NEURONTIN) 100 MG capsule Take 1 capsule (100 mg total) by mouth at bedtime. Takes 121m at bedtime. Takes an additional 1043mduring the day if needed for nerve pain. 90 capsule 4  . ondansetron (ZOFRAN) 8 MG tablet Take 1 tablet (8 mg total) by mouth 2 (two) times daily as needed (Nausea or vomiting). 30 tablet 1   No current facility-administered medications for this visit.    Facility-Administered Medications Ordered in Other Visits  Medication Dose Route Frequency Provider Last Rate Last Dose  . 0.9 %  sodium chloride infusion   Intravenous Once GuNicholas LoseMD      . bevacizumab (AVASTIN) 800 mg in sodium chloride 0.9 % 100 mL chemo infusion  800 mg Intravenous Once GuNicholas LoseMD        OBJECTIVE: Middle-aged white woman examined in the treatment area recliner For vitals with today's visit please consult the treatment area flowsheet  There were no vitals filed for this visit.   There is no height or weight on file to calculate BMI.      ECOG FS:1 - Symptomatic but completely ambulatory  Sclerae unicteric, pupils round and equal Lungs no rales or rhonchi Heart regular rate and rhythm Abd soft, nontender, positive bowel sounds Neuro: nonfocal, well oriented, appropriate affect Breasts: Deferred   LAB RESULTS:  CMP     Component Value Date/Time   NA 133 (L) 04/07/2017 1028   NA 140 02/11/2017 0755   K 4.3 04/07/2017 1028   K 4.4 02/11/2017 0755   CL 98 04/07/2017 1028   CO2 24 04/07/2017 1028   CO2 26 02/11/2017 0755    GLUCOSE 91 04/07/2017 1028   GLUCOSE 93 02/11/2017 0755   BUN 8 04/07/2017 1028   BUN 7.9 02/11/2017 0755   CREATININE 0.77 04/07/2017 1028   CREATININE 0.72 03/25/2017 0832   CREATININE 0.7 02/11/2017 0755   CALCIUM 9.8  04/07/2017 1028   CALCIUM 9.6 02/11/2017 0755   PROT 7.9 04/07/2017 1028   PROT 7.5 02/11/2017 0755   ALBUMIN 4.4 04/07/2017 1028   ALBUMIN 4.4 02/11/2017 0755   AST 32 04/07/2017 1028   AST 74 (H) 03/25/2017 0832   AST 27 02/11/2017 0755   ALT 45 04/07/2017 1028   ALT 155 (H) 03/25/2017 0832   ALT 24 02/11/2017 0755   ALKPHOS 80 04/07/2017 1028   ALKPHOS 58 02/11/2017 0755   BILITOT 0.7 04/07/2017 1028   BILITOT 0.4 03/25/2017 0832   BILITOT 0.43 02/11/2017 0755   GFRNONAA >60 04/07/2017 1028   GFRNONAA >60 03/25/2017 0832   GFRAA >60 04/07/2017 1028   GFRAA >60 03/25/2017 0832    INo results found for: SPEP, UPEP  Lab Results  Component Value Date   WBC 4.3 04/07/2017   NEUTROABS 2.8 04/07/2017   HGB 11.8 04/07/2017   HCT 35.5 04/07/2017   MCV 100.1 04/07/2017   PLT 361 04/07/2017      Chemistry      Component Value Date/Time   NA 133 (L) 04/07/2017 1028   NA 140 02/11/2017 0755   K 4.3 04/07/2017 1028   K 4.4 02/11/2017 0755   CL 98 04/07/2017 1028   CO2 24 04/07/2017 1028   CO2 26 02/11/2017 0755   BUN 8 04/07/2017 1028   BUN 7.9 02/11/2017 0755   CREATININE 0.77 04/07/2017 1028   CREATININE 0.72 03/25/2017 0832   CREATININE 0.7 02/11/2017 0755      Component Value Date/Time   CALCIUM 9.8 04/07/2017 1028   CALCIUM 9.6 02/11/2017 0755   ALKPHOS 80 04/07/2017 1028   ALKPHOS 58 02/11/2017 0755   AST 32 04/07/2017 1028   AST 74 (H) 03/25/2017 0832   AST 27 02/11/2017 0755   ALT 45 04/07/2017 1028   ALT 155 (H) 03/25/2017 0832   ALT 24 02/11/2017 0755   BILITOT 0.7 04/07/2017 1028   BILITOT 0.4 03/25/2017 0832   BILITOT 0.43 02/11/2017 0755       No results found for: LABCA2  No components found for: LABCA125  No results  for input(s): INR in the last 168 hours.  Urinalysis    Component Value Date/Time   COLORURINE YELLOW 03/29/2015 0920   APPEARANCEUR CLEAR 03/29/2015 0920   LABSPEC 1.005 10/09/2015 1239   PHURINE 6.0 10/09/2015 1239   PHURINE 7.5 03/29/2015 0920   GLUCOSEU Negative 10/09/2015 1239   HGBUR Negative 10/09/2015 1239   HGBUR SMALL (A) 03/29/2015 0920   BILIRUBINUR Negative 10/09/2015 1239   KETONESUR Negative 10/09/2015 1239   KETONESUR NEGATIVE 03/29/2015 0920   PROTEINUR NEGATIVE 04/07/2017 1035   UROBILINOGEN 0.2 10/09/2015 1239   NITRITE Negative 10/09/2015 1239   NITRITE NEGATIVE 03/29/2015 0920   LEUKOCYTESUR Negative 10/09/2015 1239     STUDIES: She had a MRI abdomen on 03/24/17 showing: No findings to suggest metastatic disease to the liver. 1.7 x 1.1 cm cavernous hemangioma in segment 6 of the liver. Small proteinaceous/hemorrhagic cysts in the left kidney.    ELIGIBLE FOR AVAILABLE RESEARCH PROTOCOL:  discussing protocols at [place]: 52 y.o. [place] woman status post radical tumor debulking with optimal cytoreduction (R0) 03/27/2015 for a stage IIIC, high-grade right fallopian tube carcinoma  (a) baseline CA-125 was1226.  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)  (1) adjuvant chemotherapy consisted of  carboplatin and paclitaxel for 6 doses, begun 04/14/2015, completed 08/11/2015  (a) paclitaxel was omitted from cycle 5 and dose reduced on cycle 6 because of neuropathy   (b) a "make up" dose of paclitaxel was given 08/25/2015  (c) last carboplatin dose was 08/11/2015  (d) CA-125 normalized by 06/16/2015   (2) FIRST RECURRENCE: January 2018  (a) CA-125 rise beginning November 2017 led to CT scans and PET scans December 2017, all negative  (b) exploratory laparotomy 02/24/2016 showed  pathologically confirmed recurrence, with miliary disease involving all examined surfaces  (c) port-site metastases noted 03/08/2016  (3) carboplatin/liposomal doxorubicin started 03/05/2016, repeated every three weeks x4, completed 06/04/2016.  (a) cycle 3 delayed one week because of neutropenia; OnPro added  (4) RUCAPARIB started 07/02/2016  (a) restaging 10/01/2016: Normal CA 125, negative CT of the abdomen and pelvis  (b) labs 11/24/2016 shows a rise in her CA 125-43.4, with continuing rise thereafter  (c) rucaparib discontinued October 2018  (5) anastrozole started December 23, 2016-stopped after a couple of weeks  SECOND RECURRENCE: (6) Gemcitabine/Bevacizumab started on 02/04/2017  PLAN Jolicia likely has a urinary tract infection, but it could be related to her abdominal carcinomatosis or perhaps simply to sinus issues.  It is not well enough localized for me to tell.  We are going to get a flu swab and will get a get an urinalysis and urine culture.  I do not think we need a chest x-ray or blood culture.   I am going to go ahead and start her on Keflex and she understands that she will call me if she develops a yeast infection or diarrhea from the medication.  I expect she will feel considerably better after 3 or 4 days on this medication  We are not giving the gemcitabine today.  We likely will not give it on next week which would be day 8 for this cycle.  However I note that the CA 125 continues to drop and we may want to resume the gemcitabine with the following cycle day 1 and continue until there is no further drop in the Ca1 25  She knows to call for any issues that may develop before the next visit.  Lynett Brasil, Virgie Dad, MD  04/08/17 10:43 AM Medical Oncology and Hematology [facility] [address] [place], [place] [address] Tel. [phone]    Fax. [phone]  This document serves as a record of services personally performed by Lurline Del, MD. It  was created on his behalf by Sheron Nightingale, a trained medical scribe. The creation of this record is based on the scribe's personal observations and the provider's statements to them.   I have reviewed the above documentation for accuracy and completeness, and I agree with the above.

## 2017-04-10 LAB — URINE CULTURE: Culture: NO GROWTH

## 2017-04-13 NOTE — Progress Notes (Signed)
Kathryn Ramsey  [phone] Fax:([phone]     ID: Kathryn Ramsey DOB: 07/29/1965  MR#: [id number]  JYN#:[id number]  Patient Care Team: Harlan Stains, MD as PCP - General (Family Medicine) Gordy Levan, MD as Consulting Physician (Oncology) [name], MD as Consulting Physician (Obstetrics and Gynecology) Juanita Craver, MD as Consulting Physician (Gastroenterology) Servando Salina, MD as Consulting Physician (Obstetrics and Gynecology) Nickie Retort, MD as Consulting Physician (Urology) Gillis Ends, MD as Referring Physician (Obstetrics and Gynecology) OTHER MD: Festus Aloe [phone])  CHIEF COMPLAINT: Recurrent ovarian cancer  CURRENT TREATMENT: Gemcitabine and bevacizumab started 02/04/2017  INTERVAL HISTORY: Kathryn Ramsey returns today for a follow-up and treatment of her recurrent ovarian cancer accompanied by her husband. Today is day 8 cycle 4 of gemcitabine given on days 1 and 8 every 21 day.  However we did not give her gemcitabine day 1 cycle 4, a week ago, because she was symptomatic, likely for unrelated reasons.  At that point we considered stopping gemcitabine and continuing with bevacizumab alone. This week, she reports that she is feeling better compared to last week.  She also receives Bevacizumab every 21 days, with her next dose due 04/29/2017. She tolerates this well.  Her next mammogram is scheduled for 04/21/2017   REVIEW OF SYSTEMS: Kathryn Ramsey reports that she is no longer running a fever. A few years ago she was diagnosed with sickness/viral induced asthma. She has an asthmatic lingering cough, and her energy level is low. She discontinued her exercise for the last couple of weeks because she has been sick and fatigued. After being prescribed Keflex, she experienced diarrhea on 04/11/2017. She reports abdominal tenderness and pinching occurring at anytime that can last most of the day. She is careful with what she  eats and she continues her activities when this happens. She notes that IBS could be part of the reason for the pain. She is having regular bowel movements and has had improvement in her hemorrhoid. She tries to stretch her abdomen in the morning. She is considering going to [facility] for pelvic healthy physical therapy. She denies unusual headaches, visual changes, nausea, vomiting, or dizziness. There has been no unusual cough, phlegm production, or pleurisy. This been no change in bowel or bladder habits. She denies unexplained fatigue or unexplained weight loss, bleeding, rash, or fever. A detailed review of systems was otherwise stable.    HISTORY OF PRESENT ILLNESS: From Dr. Serita Grit original intake note 03/17/2015:  "Kathryn Ramsey is a very pleasant G4P4 who is seen in consultation at the request of Dr Collene Mares and Dr Garwin Brothers for peritoneal carcinomatosis. The patient has a history of a workup by urology for gross hematuria which included a pelvic examine was suggested for extrinsic mass effect on the bladder on cystoscopy. Cystoscopy took place on in December 2016. The patient denies abdominal pain bloating early satiety or abdominal distention.  On 08/03/2015 at [facility] she underwent a CT scan of the abdomen and pelvis as ordered by Dr Baruch Gouty. This revealed a 1.4 cm low attenuation lesion on the posterior right hepatic lobe suggestive of a hemangioma. Status post hysterectomy. No ovarian masses. Moderate ascites. Omental caking seen in the lateral left abdomen and pelvis. Peritoneal nodularity in the left lateral pelvic cul-de-sac. No gross extrinsic compression on the bladder was identified on imaging.  The patient was then seen by her gastroenterologist, Dr. Collene Mares, who performed a colonoscopy which was unremarkable.  Tumor markers were drawn on 08/04/2015 and these  included a CA-125 that was elevated to 957, and a CEA that was normal at 1."  On 03/27/2015 the patient underwent  diagnostic laparoscopy, exploratory laparotomy with bilateral salpingo-oophorectomy, omentectomy, and radical tumor debulking with optimal cytoreduction (R0) of what proved to be a stage IIIc right fallopian tube cancer. She was treated adjuvantly with carboplatin and paclitaxel, as detailed below. Her CA 125 dropped from 1226 on 03/20/2015 to 26.1 by 06/16/2015.  Her subsequent history is as detailed below.  I PAST MEDICAL HISTORY: Past Medical History:  Diagnosis Date  . Acute sensory neuropathy ([place]) 06/26/2015  . Allergy   . Anemia   . Asthma    illness induced asthma  . Blood transfusion without reported diagnosis    at age 84 years old D/T surgery to femur being crushed  . 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 Heart murmur    pt, states had a "working heart murmur"  . History of kidney stones   . PONV (postoperative nausea and vomiting)   . Skin cancer     PAST SURGICAL HISTORY: Past Surgical History:  Procedure Laterality Date  . 3 laporscopic proceedures    . ABDOMINAL HYSTERECTOMY     2010  . CHOLECYSTECTOMY     2010  . DILATION AND CURETTAGE OF UTERUS     1997  . LAPAROSCOPY N/A 03/27/2015   Procedure: DIAGNOSTIC LAPAROSCOPY ;  Surgeon: [name], MD;  Location: [facility];  Service: Gynecology;  Laterality: N/A;  . LAPAROSCOPY N/A 02/24/2016   Procedure: LAPAROSCOPY DIAGNOSTIC WITH PERITONEAL WASHINGS AND PERITONEAL BIOPSY;  Surgeon: [name], MD;  Location: [facility];  Service: Gynecology;  Laterality: N/A;  . LAPAROTOMY N/A 03/27/2015   Procedure: EXPLORATORY LAPAROTOMY, BILATERAL SALPINGO OOPHORECTOMY, OMENTECTOMY, RADICAL TUMOR DEBULKING;  Surgeon: [name], MD;  Location: [facility];  Service: Gynecology;  Laterality: N/A;  . sinus surgery 1994      FAMILY HISTORY Family History  Problem Relation Age of Onset  . Hypertension Father   . Skin cancer Father        nonmelanoma skin cancers in  his late 14s  . Non-Hodgkin's lymphoma Maternal Aunt        dx. 29s; smoker  . Stroke Maternal Grandmother   . Other Mother        benign meningioma dx. early-mid-70s; hysterectomy in her late 51s for heavy periods - still has ovaries  . Other Son        one son with pre-cancerous skin findings  . Other Daughter        cysts on ovaries and hx of heavy periods  . Other Sister        hysterectomy for cysts - still has ovaries  . Heart attack Maternal Grandfather   . Heart attack Paternal Grandfather   . Renal cancer Maternal Aunt 60       smoker  . Breast cancer Maternal Aunt 78  . Other Maternal Aunt        dx. benign brain tumor (meningioma) at 34; 2nd benign brain tumor in her late 71s; hx of radical hysterectomy at age 35  . Brain cancer Other        NOS tumor  The patient's parents are both living, in their late 43s as of January 2018. The patient has one brother, 3 sisters. There is no history of ovarian cancer in the family. One maternal aunt  had breast and kidney cancer, another had non-Hodgkin's lymphoma.  GYNECOLOGIC HISTORY:  No LMP recorded. Patient has had a hysterectomy. 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 Her husband [name] is a Engineer, maintenance (IT). Their children are 63, 22, 17 and 53 y/o as of JAN 2018. The oldest works as an Electrical engineer in the [employer] in [place]. The other 3 children live in [place], the youngest currently attending [employer]. Rhe patient has no grandchildren. She attends [employer]    ADVANCED DIRECTIVES:    HEALTH MAINTENANCE: Social History   Tobacco Use  . Smoking status: Never Smoker  . Smokeless tobacco: Never Used  Substance Use Topics  . Alcohol use: Yes    Comment:  3 glasses wine or beer/week  . Drug use: No     Colonoscopy:2016  PAP: s/p hyst  Bone density:   Allergies  Allergen Reactions  . Bee Venom Shortness  Of Breath    swelling  . Sulfa Antibiotics Shortness Of Breath    Vomit , diarrhea , hives  . Iodinated Diagnostic Agents Other (See Comments)    01/27/17 Pt reports sneezing and congestion. Pt states she takes claritin as a premed for CT scan in [place]. Premedicated with Benadryl 25 mg PO. Post CT,no sneezing but had mild drainage/congestion which resolved after a few minutes with oral fluids.    Current Outpatient Medications  Medication Sig Dispense Refill  . cephALEXin (KEFLEX) 500 MG capsule Take 1 capsule (500 mg total) by mouth 2 (two) times daily. 10 capsule 0  . famotidine (PEPCID) 20 MG tablet Take 20 mg by mouth daily.    [name] gabapentin (NEURONTIN) 100 MG capsule Take 1 capsule (100 mg total) by mouth at bedtime. Takes 180m at bedtime. Takes an additional 1020mduring the day if needed for nerve pain. 90 capsule 4  . ondansetron (ZOFRAN) 8 MG tablet Take 1 tablet (8 mg total) by mouth 2 (two) times daily as needed (Nausea or vomiting). 30 tablet 1   No current facility-administered medications for this visit.     OBJECTIVE: Middle-aged white woman in no acute distress  Vitals:   04/15/17 0906  BP: 118/69  Pulse: 71  Resp: 18  Temp: 97.8 F (36.6 C)  SpO2: 100%     Body mass index is 20.65 kg/m.      ECOG FS:1 - Symptomatic but completely ambulatory  Sclerae unicteric, pupils round and equal Oropharynx clear and moist No cervical or supraclavicular adenopathy Lungs no rales or rhonchi Heart regular rate and rhythm Abd soft, nontender, positive bowel sounds; palpable masses at both lower quadrants, nontender MSK no focal spinal tenderness, no upper extremity lymphedema Neuro: nonfocal, well oriented, appropriate affect Breasts: Deferred   LAB RESULTS:  CMP     Component Value Date/Time   NA 133 (L) 04/07/2017 1028   NA 140 02/11/2017 0755   K 4.3 04/07/2017 1028   K 4.4 02/11/2017 0755   CL 98 04/07/2017 1028   CO2 24 04/07/2017 1028   CO2 26 02/11/2017  0755   GLUCOSE 91 04/07/2017 1028   GLUCOSE 93 02/11/2017 0755   BUN 8 04/07/2017 1028   BUN 7.9 02/11/2017 0755   CREATININE 0.77 04/07/2017 1028   CREATININE 0.72 03/25/2017 0832   CREATININE 0.7 02/11/2017 0755   CALCIUM 9.8 04/07/2017 1028   CALCIUM 9.6 02/11/2017 0755   PROT 7.9 04/07/2017  1028   PROT 7.5 02/11/2017 0755   ALBUMIN 4.4 04/07/2017 1028   ALBUMIN 4.4 02/11/2017 0755   AST 32 04/07/2017 1028   AST 74 (H) 03/25/2017 0832   AST 27 02/11/2017 0755   ALT 45 04/07/2017 1028   ALT 155 (H) 03/25/2017 0832   ALT 24 02/11/2017 0755   ALKPHOS 80 04/07/2017 1028   ALKPHOS 58 02/11/2017 0755   BILITOT 0.7 04/07/2017 1028   BILITOT 0.4 03/25/2017 0832   BILITOT 0.43 02/11/2017 0755   GFRNONAA >60 04/07/2017 1028   GFRNONAA >60 03/25/2017 0832   GFRAA >60 04/07/2017 1028   GFRAA >60 03/25/2017 0832    INo results found for: SPEP, UPEP  Lab Results  Component Value Date   WBC 4.1 04/15/2017   NEUTROABS 2.3 04/15/2017   HGB 11.7 04/15/2017   HCT 35.4 04/15/2017   MCV 99.7 04/15/2017   PLT 375 04/15/2017      Chemistry      Component Value Date/Time   NA 133 (L) 04/07/2017 1028   NA 140 02/11/2017 0755   K 4.3 04/07/2017 1028   K 4.4 02/11/2017 0755   CL 98 04/07/2017 1028   CO2 24 04/07/2017 1028   CO2 26 02/11/2017 0755   BUN 8 04/07/2017 1028   BUN 7.9 02/11/2017 0755   CREATININE 0.77 04/07/2017 1028   CREATININE 0.72 03/25/2017 0832   CREATININE 0.7 02/11/2017 0755      Component Value Date/Time   CALCIUM 9.8 04/07/2017 1028   CALCIUM 9.6 02/11/2017 0755   ALKPHOS 80 04/07/2017 1028   ALKPHOS 58 02/11/2017 0755   AST 32 04/07/2017 1028   AST 74 (H) 03/25/2017 0832   AST 27 02/11/2017 0755   ALT 45 04/07/2017 1028   ALT 155 (H) 03/25/2017 0832   ALT 24 02/11/2017 0755   BILITOT 0.7 04/07/2017 1028   BILITOT 0.4 03/25/2017 0832   BILITOT 0.43 02/11/2017 0755       No results found for: LABCA2  No components found for: LABCA125  No  results for input(s): INR in the last 168 hours.  Urinalysis    Component Value Date/Time   COLORURINE STRAW (A) 04/08/2017 1123   APPEARANCEUR CLEAR 04/08/2017 1123   LABSPEC 1.003 (L) 04/08/2017 1123   LABSPEC 1.005 10/09/2015 1239   PHURINE 8.0 04/08/2017 1123   GLUCOSEU NEGATIVE 04/08/2017 1123   GLUCOSEU Negative 10/09/2015 1239   HGBUR NEGATIVE 04/08/2017 1123   BILIRUBINUR NEGATIVE 04/08/2017 1123   BILIRUBINUR Negative 10/09/2015 1239   KETONESUR NEGATIVE 04/08/2017 1123   PROTEINUR NEGATIVE 04/15/2017 0836   UROBILINOGEN 0.2 10/09/2015 1239   NITRITE NEGATIVE 04/08/2017 1123   LEUKOCYTESUR NEGATIVE 04/08/2017 1123   LEUKOCYTESUR Negative 10/09/2015 1239     STUDIES: Her next mammogram is scheduled for 04/21/2017   She had a MRI abdomen on 03/24/17 showing: No findings to suggest metastatic disease to the liver. 1.7 x 1.1 cm cavernous hemangioma in segment 6 of the liver. Small proteinaceous/hemorrhagic cysts in the left kidney.    ELIGIBLE FOR AVAILABLE RESEARCH PROTOCOL:  discussing protocols at [place]: 52 y.o. [place] woman status post radical tumor debulking with optimal cytoreduction (R0) 03/27/2015 for a stage IIIC, high-grade right fallopian tube carcinoma  (a) baseline CA-125 was1226.  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)  (1) adjuvant chemotherapy consisted of carboplatin and paclitaxel  for 6 doses, begun 04/14/2015, completed 08/11/2015  (a) paclitaxel was omitted from cycle 5 and dose reduced on cycle 6 because of neuropathy   (b) a "make up" dose of paclitaxel was given 08/25/2015  (c) last carboplatin dose was 08/11/2015  (d) CA-125 normalized by 06/16/2015   (2) FIRST RECURRENCE: January 2018  (a) CA-125 rise beginning November 2017 led to CT scans and PET scans  December 2017, all negative  (b) exploratory laparotomy 02/24/2016 showed pathologically confirmed recurrence, with miliary disease involving all examined surfaces  (c) port-site metastases noted 03/08/2016  (3) carboplatin/liposomal doxorubicin started 03/05/2016, repeated every three weeks x4, completed 06/04/2016.  (a) cycle 3 delayed one week because of neutropenia; OnPro added  (4) RUCAPARIB started 07/02/2016  (a) restaging 10/01/2016: Normal CA 125, negative CT of the abdomen and pelvis  (b) labs 11/24/2016 shows a rise in her CA 125-43.4, with continuing rise thereafter  (c) rucaparib discontinued October 2018  (5) anastrozole started December 23, 2016-stopped after a couple of weeks  SECOND RECURRENCE: (6) Gemcitabine/Bevacizumab started on 02/04/2017  (a) gemcitabine omitted cycle 4 because of intercurrent infection  PLAN Kathryn Ramsey is feeling better but has not yet completely recovered from her upper respiratory and other symptoms.  She also had a round of antibiotics which caused diarrhea and likely contributed to her feeling of malaise and lack of energy.  We considered treating with gemcitabine today, but she is going to [place] to be with her children next week and she wants to feel energetic at that time.  Accordingly we will not proceed with gemcitabine today.  The question is whether we are going to continue with Avastin alone or continue with both drugs.  To help [name] with that she is going to have lab work on 04/28/2017 including a CA 125, and then she will see me on 04/29/2017 at which time we will decide whether she will receive gemcitabine and bevacizumab or bevacizumab alone and that in subsequent cycles  She still has a little bit of a cough and I am writing her for some supportive medications.  She also would like to have some abdominal massage and I am referring her to physical therapy for that  She knows to call for any other issues that may develop before the next  visit.  Kathryn Ramsey, Virgie Dad, MD  04/15/17 9:21 AM Medical Oncology and Hematology [facility] [address] [place], [place] [address] Tel. [phone]    Fax. [phone]  This document serves as a record of services personally performed by Lurline Del, MD. It was created on his behalf by Sheron Nightingale, a trained medical scribe. The creation of this record is based on the scribe's personal observations and the provider's statements to them.   I have reviewed the above documentation for accuracy and completeness, and I agree with the above.

## 2017-04-15 ENCOUNTER — Inpatient Hospital Stay (HOSPITAL_BASED_OUTPATIENT_CLINIC_OR_DEPARTMENT_OTHER): Payer: 59 | Admitting: Oncology

## 2017-04-15 ENCOUNTER — Inpatient Hospital Stay: Payer: 59 | Attending: Oncology

## 2017-04-15 ENCOUNTER — Telehealth: Payer: Self-pay | Admitting: Oncology

## 2017-04-15 ENCOUNTER — Ambulatory Visit: Payer: 59

## 2017-04-15 VITALS — BP 118/69 | HR 71 | Temp 97.8°F | Resp 18 | Ht 63.0 in | Wt 116.6 lb

## 2017-04-15 DIAGNOSIS — C762 Malignant neoplasm of abdomen: Secondary | ICD-10-CM

## 2017-04-15 DIAGNOSIS — C5701 Malignant neoplasm of right fallopian tube: Secondary | ICD-10-CM | POA: Insufficient documentation

## 2017-04-15 DIAGNOSIS — R05 Cough: Secondary | ICD-10-CM | POA: Insufficient documentation

## 2017-04-15 DIAGNOSIS — G620 Drug-induced polyneuropathy: Secondary | ICD-10-CM | POA: Insufficient documentation

## 2017-04-15 DIAGNOSIS — Z79899 Other long term (current) drug therapy: Secondary | ICD-10-CM | POA: Insufficient documentation

## 2017-04-15 DIAGNOSIS — Z85828 Personal history of other malignant neoplasm of skin: Secondary | ICD-10-CM

## 2017-04-15 DIAGNOSIS — R109 Unspecified abdominal pain: Secondary | ICD-10-CM | POA: Insufficient documentation

## 2017-04-15 DIAGNOSIS — Z8051 Family history of malignant neoplasm of kidney: Secondary | ICD-10-CM | POA: Diagnosis not present

## 2017-04-15 DIAGNOSIS — J45909 Unspecified asthma, uncomplicated: Secondary | ICD-10-CM | POA: Diagnosis not present

## 2017-04-15 DIAGNOSIS — C786 Secondary malignant neoplasm of retroperitoneum and peritoneum: Secondary | ICD-10-CM | POA: Insufficient documentation

## 2017-04-15 DIAGNOSIS — Z5111 Encounter for antineoplastic chemotherapy: Secondary | ICD-10-CM | POA: Insufficient documentation

## 2017-04-15 DIAGNOSIS — K625 Hemorrhage of anus and rectum: Secondary | ICD-10-CM | POA: Insufficient documentation

## 2017-04-15 DIAGNOSIS — Z5189 Encounter for other specified aftercare: Secondary | ICD-10-CM | POA: Diagnosis not present

## 2017-04-15 DIAGNOSIS — Z803 Family history of malignant neoplasm of breast: Secondary | ICD-10-CM | POA: Insufficient documentation

## 2017-04-15 DIAGNOSIS — C801 Malignant (primary) neoplasm, unspecified: Secondary | ICD-10-CM

## 2017-04-15 DIAGNOSIS — R040 Epistaxis: Secondary | ICD-10-CM | POA: Insufficient documentation

## 2017-04-15 DIAGNOSIS — Z5112 Encounter for antineoplastic immunotherapy: Secondary | ICD-10-CM | POA: Insufficient documentation

## 2017-04-15 LAB — CBC WITH DIFFERENTIAL/PLATELET
Basophils Absolute: 0 10*3/uL (ref 0.0–0.1)
Basophils Relative: 1 %
Eosinophils Absolute: 0.1 10*3/uL (ref 0.0–0.5)
Eosinophils Relative: 2 %
HCT: 35.4 % (ref 34.8–46.6)
Hemoglobin: 11.7 g/dL (ref 11.6–15.9)
Lymphocytes Relative: 26 %
Lymphs Abs: 1 10*3/uL (ref 0.9–3.3)
MCH: 33 pg (ref 25.1–34.0)
MCHC: 33.1 g/dL (ref 31.5–36.0)
MCV: 99.7 fL (ref 79.5–101.0)
Monocytes Absolute: 0.6 10*3/uL (ref 0.1–0.9)
Monocytes Relative: 14 %
Neutro Abs: 2.3 10*3/uL (ref 1.5–6.5)
Neutrophils Relative %: 57 %
Platelets: 375 10*3/uL (ref 145–400)
RBC: 3.55 MIL/uL — ABNORMAL LOW (ref 3.70–5.45)
RDW: 14.8 % — ABNORMAL HIGH (ref 11.2–14.5)
WBC: 4.1 10*3/uL (ref 3.9–10.3)

## 2017-04-15 LAB — COMPREHENSIVE METABOLIC PANEL
ALT: 26 U/L (ref 0–55)
AST: 23 U/L (ref 5–34)
Albumin: 4 g/dL (ref 3.5–5.0)
Alkaline Phosphatase: 77 U/L (ref 40–150)
Anion gap: 10 (ref 3–11)
BUN: 8 mg/dL (ref 7–26)
CO2: 25 mmol/L (ref 22–29)
Calcium: 9.8 mg/dL (ref 8.4–10.4)
Chloride: 101 mmol/L (ref 98–109)
Creatinine, Ser: 0.78 mg/dL (ref 0.60–1.10)
GFR calc Af Amer: >60 mL/min (ref >60)
GFR calc non Af Amer: >60 mL/min (ref >60)
Glucose, Bld: 83 mg/dL (ref 70–140)
Potassium: 5.4 mmol/L — ABNORMAL HIGH (ref 3.5–5.1)
Sodium: 136 mmol/L (ref 136–145)
Total Bilirubin: 0.5 mg/dL (ref 0.2–1.2)
Total Protein: 7.6 g/dL (ref 6.4–8.3)

## 2017-04-15 LAB — TOTAL PROTEIN, URINE DIPSTICK: Protein, ur: NEGATIVE mg/dL

## 2017-04-15 MED ORDER — ALBUTEROL SULFATE HFA 108 (90 BASE) MCG/ACT IN AERS: 2.0000 | INHALATION_SPRAY | Freq: Four times a day (QID) | RESPIRATORY_TRACT | 2 refills | Status: DC | PRN

## 2017-04-15 MED ORDER — BENZONATATE 200 MG PO CAPS
200.0000 mg | ORAL_CAPSULE | Freq: Three times a day (TID) | ORAL | 2 refills | Status: DC | PRN
Start: 2017-04-15 — End: 2017-05-27

## 2017-04-15 NOTE — Telephone Encounter (Signed)
Gave avs and calendar for march °

## 2017-04-19 ENCOUNTER — Encounter: Payer: Self-pay | Admitting: Oncology

## 2017-04-19 ENCOUNTER — Telehealth: Payer: Self-pay

## 2017-04-19 NOTE — Telephone Encounter (Signed)
Returned pt's call regarding having abd pain, sore to touch - reports Tylenol ES is "really helping" but noticed blood in bms.  Per Dr Jana Hakim, arrange appt for symptom management with Sandi Mealy PA in the morning.  Scheduled pt for 9 am on 04/20/2017.

## 2017-04-20 ENCOUNTER — Inpatient Hospital Stay (HOSPITAL_BASED_OUTPATIENT_CLINIC_OR_DEPARTMENT_OTHER): Payer: 59 | Admitting: Medical

## 2017-04-20 VITALS — BP 117/65 | HR 70 | Temp 97.8°F | Resp 18 | Ht 63.0 in | Wt 118.2 lb

## 2017-04-20 DIAGNOSIS — K922 Gastrointestinal hemorrhage, unspecified: Secondary | ICD-10-CM

## 2017-04-20 DIAGNOSIS — Z5111 Encounter for antineoplastic chemotherapy: Secondary | ICD-10-CM | POA: Diagnosis not present

## 2017-04-20 DIAGNOSIS — K625 Hemorrhage of anus and rectum: Secondary | ICD-10-CM

## 2017-04-20 DIAGNOSIS — C5701 Malignant neoplasm of right fallopian tube: Secondary | ICD-10-CM | POA: Diagnosis not present

## 2017-04-20 DIAGNOSIS — R109 Unspecified abdominal pain: Secondary | ICD-10-CM | POA: Diagnosis not present

## 2017-04-20 NOTE — Progress Notes (Signed)
[facility] Progress Note   Kathryn Ramsey [id number] 12-30-1965 52 y.o.  Kathryn Ramsey is managed by Dr. Jana Hakim  Actively treated with chemotherapy: yes  Current Therapy: Avastin and gemcitabine  Last Treated: 04/08/2017 (cycle 4, day 1)  Assessment: Plan:    Lower GI bleeding - Plan: Ambulatory referral to Gastroenterology   Lower GI bleeding, bright red blood per rectum, and right lower quadrant pain: A referral has been made to Juanita Craver, MD (gastroenterology).  The patient's most recent CBC from 04/15/2017 was reviewed and showed no change in her hemoglobin or hematocrit.  Both of these values remain within normal range.  The patient's MRI report from 03/25/2017 and CT scans from 01/27/2017 were reviewed with the patient.  Neither of these showed evidence of metastatic disease to the liver or disease progression.  Please see After Visit Summary for patient specific instructions.  Future Appointments  Date Time Provider [place]  04/21/2017 12:30 PM [facility] MM 3 [facility]  04/21/2017  2:45 PM Monico Hoar, PT [facility]  04/28/2017  7:45 AM [facility] LAB 6 [facility] None  04/29/2017  1:00 PM Magrinat, Virgie Dad, MD [facility] None  04/29/2017  2:00 PM [facility] G24 [facility] None    Orders Placed This Encounter  Procedures  . Ambulatory referral to Gastroenterology       Subjective:   Patient ID:  Kathryn Ramsey is a 52 y.o. (DOB 11/07/1965) female.  Chief Complaint:  Chief Complaint  Patient presents with  . 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  She is managed by Dr. Tressa Busman and is treated with Avastin and gemcitabine with her last treatment on 04/08/2017 (cycle 4, day 1).  She was last seen by Dr. Jana Hakim on 04/15/2017. She had recently been treated with Keflex after having a fever of 100.4 and having mouth  sores.  She experienced diarrhea with Keflex.  She reports having a history of irritable bowel syndrome.  She is now having pain in her right lower quadrant with generalized abdominal tenderness over her lower abdomen.  She states her abdomen feels worse when her stomach is empty.  She has noted bright red blood per rectum several times in the last few weeks.  She notes that there is a "film" of blood on her bowel movements.  She acknowledges a history of hemorrhoids.  She has previously seen Dr. Juanita Craver who is her gastroenterologist.  She is open to seeing her again for these issues for which she presents today.  She will be traveling to [place] for a skiing vacation with her family this weekend.  Her last labs from 04/15/2017 showed no evidence of anemia. Her hemoglobin and hematocrit were stable.  Medications: I have reviewed the patient's current medications.  Allergies:  Allergies  Allergen Reactions  . Bee Venom Shortness Of Breath    swelling  . Sulfa Antibiotics Shortness Of Breath    Vomit , diarrhea , hives  . Iodinated Diagnostic Agents Other (See Comments)    01/27/17 Pt reports sneezing and congestion. Pt states she takes claritin as a premed for CT scan in [place]. Premedicated with Benadryl 25 mg PO. Post CT,no sneezing but had mild drainage/congestion which resolved after a few minutes with oral fluids.    Past Medical History:  Diagnosis Date  . Acute sensory neuropathy ([place]) 06/26/2015  . Allergy   .  Anemia   . Asthma    illness induced asthma  . Blood transfusion without reported diagnosis    at age 21 years old D/T surgery to femur being crushed  . 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 Heart murmur    pt, states had a "working heart murmur"  . History of kidney stones   . PONV (postoperative nausea and vomiting)   . Skin cancer     Past Surgical History:  Procedure Laterality Date  . 3  laporscopic proceedures    . ABDOMINAL HYSTERECTOMY     2010  . CHOLECYSTECTOMY     2010  . DILATION AND CURETTAGE OF UTERUS     1997  . LAPAROSCOPY N/A 03/27/2015   Procedure: DIAGNOSTIC LAPAROSCOPY ;  Surgeon: [name], MD;  Location: [facility];  Service: Gynecology;  Laterality: N/A;  . LAPAROSCOPY N/A 02/24/2016   Procedure: LAPAROSCOPY DIAGNOSTIC WITH PERITONEAL WASHINGS AND PERITONEAL BIOPSY;  Surgeon: [name], MD;  Location: [facility];  Service: Gynecology;  Laterality: N/A;  . LAPAROTOMY N/A 03/27/2015   Procedure: EXPLORATORY LAPAROTOMY, BILATERAL SALPINGO OOPHORECTOMY, OMENTECTOMY, RADICAL TUMOR DEBULKING;  Surgeon: [name], MD;  Location: [facility];  Service: Gynecology;  Laterality: N/A;  . sinus surgery 1994      Family History  Problem Relation Age of Onset  . Hypertension Father   . Skin cancer Father        nonmelanoma skin cancers in his late 31s  . Non-Hodgkin's lymphoma Maternal Aunt        dx. 5s; smoker  . Stroke Maternal Grandmother   . Other Mother        benign meningioma dx. early-mid-70s; hysterectomy in her late 103s for heavy periods - still has ovaries  . Other Son        one son with pre-cancerous skin findings  . Other Daughter        cysts on ovaries and hx of heavy periods  . Other Sister        hysterectomy for cysts - still has ovaries  . Heart attack Maternal Grandfather   . Heart attack Paternal Grandfather   . Renal cancer Maternal Aunt 60       smoker  . Breast cancer Maternal Aunt 78  . Other Maternal Aunt        dx. benign brain tumor (meningioma) at 55; 2nd benign brain tumor in her late 86s; hx of radical hysterectomy at age 65  . Brain cancer Other        NOS tumor    Social History   Socioeconomic History  . Marital status: Married    Spouse name: Not on file  . Number of children: 4  . Years of education: 79  . Highest education level: Not on file  Social Needs  . Financial resource strain: Not on file  . Food insecurity -  worry: Not on file  . Food insecurity - inability: Not on file  . Transportation needs - medical: Not on file  . Transportation needs - non-medical: Not on file  Occupational History  . Occupation: Home Office  Tobacco Use  . Smoking status: Never Smoker  . Smokeless tobacco: Never Used  Substance and Sexual Activity  . Alcohol use: Yes    Comment:  3 glasses wine or beer/week  . Drug use: No  . Sexual activity: Not on file  Other Topics Concern  . Not on file  Social History Narrative   Lives at home w/ husband and children   Right-handed   3 cups caffeine per day    Past Medical History, Surgical history, Social history, and Family history were reviewed and updated as appropriate.   Please see review of systems for further details on the patient's review from today.   Review of Systems:  Review of Systems  Constitutional: Negative for activity change, appetite change, chills, diaphoresis and fever.  Gastrointestinal: Positive for abdominal pain and blood in stool. Negative for abdominal distention, constipation, diarrhea, nausea, rectal pain and vomiting.  Neurological: Negative for weakness.    Objective:   Physical Exam:  BP 117/65 (BP Location: Left Arm, Patient Position: Sitting)   Pulse 70   Temp 97.8 F (36.6 C) (Oral)   Resp 18   Ht 5\' 3"  (1.6 m)   Wt 118 lb 3.2 oz (53.6 kg)   SpO2 100%   BMI 20.94 kg/m   ECOG: 0  Physical Exam  Constitutional: No distress.  HENT:  Head: Normocephalic and atraumatic.  Cardiovascular: Normal rate, regular rhythm and normal heart sounds. Exam reveals no gallop and no friction rub.  No murmur heard. Pulmonary/Chest: Effort normal and breath sounds normal. No respiratory distress. She has no wheezes. She has no rales.  Abdominal: Soft. Bowel sounds are normal. She exhibits no distension. There is no tenderness ( ). There is no rebound and no guarding.    Neurological: She is alert. Coordination normal.  Skin: Skin is  warm and dry. She is not diaphoretic.  Psychiatric: She has a normal mood and affect. Her behavior is normal. Judgment and thought content normal.    Lab Review:     Component Value Date/Time   NA 136 04/15/2017 0836   NA 140 02/11/2017 0755   K 5.4 (H) 04/15/2017 0836   K 4.4 02/11/2017 0755   CL 101 04/15/2017 0836   CO2 25 04/15/2017 0836   CO2 26 02/11/2017 0755   GLUCOSE 83 04/15/2017 0836   GLUCOSE 93 02/11/2017 0755   BUN 8 04/15/2017 0836   BUN 7.9 02/11/2017 0755   CREATININE 0.78 04/15/2017 0836   CREATININE 0.72 03/25/2017 0832   CREATININE 0.7 02/11/2017 0755   CALCIUM 9.8 04/15/2017 0836   CALCIUM 9.6 02/11/2017 0755   PROT 7.6 04/15/2017 0836   PROT 7.5 02/11/2017 0755   ALBUMIN 4.0 04/15/2017 0836   ALBUMIN 4.4 02/11/2017 0755   AST 23 04/15/2017 0836   AST 74 (H) 03/25/2017 0832   AST 27 02/11/2017 0755   ALT 26 04/15/2017 0836   ALT 155 (H) 03/25/2017 0832   ALT 24 02/11/2017 0755   ALKPHOS 77 04/15/2017 0836   ALKPHOS 58 02/11/2017 0755   BILITOT 0.5 04/15/2017 0836   BILITOT 0.4 03/25/2017 0832   BILITOT 0.43 02/11/2017 0755   GFRNONAA >60 04/15/2017 0836   GFRNONAA >60 03/25/2017 0832   GFRAA >60 04/15/2017 0836   GFRAA >60 03/25/2017 0832       Component Value Date/Time   WBC 4.1 04/15/2017 0836   RBC 3.55 (L) 04/15/2017 0836   HGB 11.7 04/15/2017 0836   HGB 12.1 02/11/2017 0755   HCT 35.4 04/15/2017 0836   HCT 35.8 02/11/2017 0755   PLT 375 04/15/2017 0836   PLT 151 02/11/2017 0755   MCV 99.7 04/15/2017 0836   MCV 103.5 (H) 02/11/2017 0755   MCH 33.0 04/15/2017 0836   MCHC 33.1 04/15/2017  0836   RDW 14.8 (H) 04/15/2017 0836   RDW 12.4 02/11/2017 0755   LYMPHSABS 1.0 04/15/2017 0836   LYMPHSABS 1.7 02/11/2017 0755   MONOABS 0.6 04/15/2017 0836   MONOABS 0.1 02/11/2017 0755   EOSABS 0.1 04/15/2017 0836   EOSABS 0.0 02/11/2017 0755   BASOSABS 0.0 04/15/2017 0836   BASOSABS 0.0 02/11/2017 0755    -------------------------------  Imaging from last 24 hours (if applicable):  Radiology interpretation: Mr Abdomen W Wo Contrast  Result Date: 03/25/2017 CLINICAL DATA:  52 year old female with history of right fallopian tube carcinoma diagnosed in February 2017 status post bilateral salpingo-oophorectomy and omentectomy. Evaluate for potential metastatic disease to the liver. EXAM: MRI ABDOMEN WITHOUT AND WITH CONTRAST TECHNIQUE: Multiplanar multisequence MR imaging of the abdomen was performed both before and after the administration of intravenous contrast. CONTRAST:  79mL MULTIHANCE GADOBENATE DIMEGLUMINE 529 MG/ML IV SOLN COMPARISON:  No prior abdominal MRI. CT the abdomen and pelvis 11/29/2016. FINDINGS: Lower chest: Unremarkable. Hepatobiliary: In segment 6 of the liver there is a 1.7 x 1.1 cm T1 hypointense, T2 hyperintense lesion (axial image 23 of series 3) which demonstrates early peripheral nodular hyperenhancement with progressive centripetal filling, diagnostic of a cavernous hemangioma. No other suspicious hepatic lesions are noted. No intra or extrahepatic biliary ductal dilatation. Status post cholecystectomy. Pancreas: No pancreatic mass. No pancreatic ductal dilatation. No pancreatic or peripancreatic fluid or inflammatory changes. Spleen:  Unremarkable. Adrenals/Urinary Tract: In the lower pole of the left kidney there is a 5 mm T1 hyperintense, T2 hypointense, nonenhancing lesion (axial image 75 of series 1100), compatible with a tiny proteinaceous/hemorrhagic cyst. In the upper pole the left kidney there is an 8 mm lesion (axial image 40 of series 11103) which is intermediate to high attenuation on T1 weighted images, low signal intensity on T2 weighted images, and does not enhance, also compatible with a small mildly proteinaceous/hemorrhagic cyst. Right kidney and bilateral adrenal glands are normal in appearance. No hydroureteronephrosis in the visualized portions of the abdomen.  Stomach/Bowel: Portions are unremarkable. Vascular/Lymphatic: No aneurysm identified in the visualized abdominal vasculature. No lymphadenopathy noted in the abdomen. Other:  No significant volume portions of the peritoneal cavity. Musculoskeletal: Unremarkable. IMPRESSION: 1. No findings to suggest metastatic disease to the liver. 2. 1.7 x 1.1 cm cavernous hemangioma in segment 6 of the liver. 3. Small proteinaceous/hemorrhagic cysts in the left kidney. Electronically Signed   By: Vinnie Langton M.D.   On: 03/25/2017 08:22

## 2017-04-21 ENCOUNTER — Ambulatory Visit: Payer: 59 | Attending: Oncology | Admitting: Physical Therapy

## 2017-04-21 ENCOUNTER — Ambulatory Visit
Admission: RE | Admit: 2017-04-21 | Discharge: 2017-04-21 | Disposition: A | Discharge status: Home / Self Care | Payer: 59 | Source: Ambulatory Visit | Attending: Oncology | Admitting: Oncology

## 2017-04-21 DIAGNOSIS — M6281 Muscle weakness (generalized): Secondary | ICD-10-CM | POA: Insufficient documentation

## 2017-04-21 DIAGNOSIS — Z1231 Encounter for screening mammogram for malignant neoplasm of breast: Secondary | ICD-10-CM

## 2017-04-21 DIAGNOSIS — M62838 Other muscle spasm: Secondary | ICD-10-CM | POA: Insufficient documentation

## 2017-04-21 NOTE — Therapy (Signed)
[facility] [address], [place], [place], [address] Phone: [phone]   Fax:  [phone]  Physical Therapy Evaluation  Patient Details  Name: Kathryn Ramsey MRN: [id number] Date of Birth: 10/25/1965 Referring Provider: Dr. Lurline Del   Encounter Date: 04/21/2017  PT End of Session - 04/21/17 1525    Visit Number  1    Date for PT Re-Evaluation  07/14/17    Authorization Type  [employer]    Authorization - Visit Number  1    Authorization - Number of Visits  23    PT Start Time  1430    PT Stop Time  1515    PT Time Calculation (min)  45 min    Activity Tolerance  Patient tolerated treatment well    Behavior During Therapy  [facility] for tasks assessed/performed       Past Medical History:  Diagnosis Date  . Acute sensory neuropathy ([place]) 06/26/2015  . Allergy   . Anemia   . Asthma    illness induced asthma  . Blood transfusion without reported diagnosis    at age 52 years old D/T surgery to femur being crushed  . 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 Heart murmur    pt, states had a "working heart murmur"  . History of kidney stones   . PONV (postoperative nausea and vomiting)   . Skin cancer     Past Surgical History:  Procedure Laterality Date  . 3 laporscopic proceedures    . ABDOMINAL HYSTERECTOMY     2010  . CHOLECYSTECTOMY     2010  . DILATION AND CURETTAGE OF UTERUS     1997  . LAPAROSCOPY N/A 03/27/2015   Procedure: DIAGNOSTIC LAPAROSCOPY ;  Surgeon: [name], MD;  Location: [facility];  Service: Gynecology;  Laterality: N/A;  . LAPAROSCOPY N/A 02/24/2016   Procedure: LAPAROSCOPY DIAGNOSTIC WITH PERITONEAL WASHINGS AND PERITONEAL BIOPSY;  Surgeon: [name], MD;  Location: [facility];  Service: Gynecology;  Laterality: N/A;  . LAPAROTOMY N/A 03/27/2015   Procedure: EXPLORATORY LAPAROTOMY, BILATERAL SALPINGO OOPHORECTOMY, OMENTECTOMY,  RADICAL TUMOR DEBULKING;  Surgeon: [name], MD;  Location: [facility];  Service: Gynecology;  Laterality: N/A;  . sinus surgery 1994      There were no vitals filed for this visit.   Subjective Assessment - 04/21/17 1434    Subjective  Patient had a reoccurance of cancer in December 2018.  Patient had a scan that showed no measurable areas.  Patient reports abdominal discomfort.  I have bloating.     Patient Stated Goals  reduce abdominal discomfort    Currently in Pain?  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 Sarajane Jews Magrinat    Onset Date/Surgical Date  02/04/17    Prior Therapy  yes      Precautions   Precautions  Other (comment)    Precaution Comments  cancer      Restrictions   Weight Bearing Restrictions  No      Balance Screen   Has the patient fallen in the past 6 months  No    Has the patient had a decrease in activity level because of a fear of falling?   No    Is the patient reluctant to leave their home because of a fear of falling?   No      Home Film/video editor residence      Prior Function   Level of Independence  Independent    Leisure  yoga 2-3 times per week      Cognition   Overall Cognitive Status  Within Functional Limits for tasks assessed      Observation/Other Assessments   Skin Integrity  decreased mobility of the lower abdominal scar    Focus on Therapeutic Outcomes  (FOTO)   patient is limited by 35% due to pain in the abdomen when she is standing      Posture/Postural Control   Posture/Postural Control  No significant limitations      ROM / Strength   AROM / PROM / Strength  AROM;PROM;Strength      Strength   Overall Strength Comments  abdominal crunch does not cause pain      Palpation   Spinal mobility  good mobility of the lower rib cage    SI assessment   pelvis in correct alignment    Palpation comment  tenderness located on the upper midline of the abdomen, below the umbilicus, tightness felt along the midling of the abdoment and lateral sides      Transfers   Transfers  Not assessed      Ambulation/Gait   Ambulation/Gait  No             Objective measurements completed on examination: See above findings.    Pelvic Floor Special Questions - 04/21/17 0001    Currently Sexually Active  Yes    Is this Painful  Yes    Marinoff Scale  discomfort that does not affect completion    Urinary Leakage  No       [facility] Adult PT Treatment/Exercise - 04/21/17 0001      Manual Therapy   Manual Therapy  Myofascial release;Soft tissue mobilization    Soft tissue mobilization  circular massage to abdomen to promote peristalic motion of the intestines to reduce pain and bloating    Myofascial Release  myofascial release through the midline of the abdomen to release the 3 planes of fascia             PT Education - 04/21/17 1524    Education provided  Yes    Education Details  went over abdominal massage so she is able to perform at home    Person(s) Educated  Patient    Methods  Explanation;Demonstration    Comprehension  Verbalized understanding;Returned demonstration       PT Short Term Goals - 04/21/17 1532      PT SHORT TERM GOAL #1   Title  independent with scar massage to improve tissue mobility    Period  Weeks    Status  New    Target Date  05/19/17      PT SHORT TERM GOAL #2   Title  understand how to perform  abdominal massage to promote peristalic mobility to improve constipation    Time  4    Period  Weeks    Status  New    Target Date  05/19/17        PT Long Term Goals - 04/21/17 1533      PT LONG TERM GOAL #1   Title  independent with HEP    Time  4    Period  Months    Status  New    Target Date  07/14/17      PT LONG TERM GOAL #2   Title  reduction of abdominal pain as she is standing due tp reduction of fascial tightness    Time  12    Period  Weeks    Status  New    Target Date  07/14/17      PT LONG TERM GOAL #3   Title  mobility of abdominal scars improved >/= 75% so patient is able to contract lower abdominals     Time  4    Period  Months    Status  Achieved    Target Date  07/14/17      PT LONG TERM GOAL #4   Title  ----      PT LONG TERM GOAL #5   Title  -----             Plan - 04/21/17 1526    Clinical Impression Statement  Patient is a 52 year old female with 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  Patient is presently having chemotherapy treatment.  Patient has noticed constant abdominal soreness at level 2/10 and spikes to 8/10 when she is standing.  Patient has tenderness throughout the abdomen with palpation and decreased mobility of the lower abdominal scar.  Patient has fascial restrictions in the midling of the abdomen and lateral sides .  After manual work the abdoment had good sounds and improved softness.  Patient will benefit from skilled therapy to reduce abdominal pain when she is standing and during daily activities.     History and Personal Factors relevant to plan of care:  recurrent cancer with last episode 02/04/2017; presently taking chemotherapy    Clinical Presentation  Stable    Clinical Presentation due to:  stable condition    Clinical Decision Making  Low    Rehab Potential  Excellent    Clinical Impairments Affecting Rehab Potential  recurrent cancer with last episode 02/04/2017; presently taking chemotherapy     PT Frequency  2x / week    PT Duration  12 weeks    PT Treatment/Interventions  Therapeutic activities;Therapeutic exercise;Patient/family education;Neuromuscular re-education;Manual techniques;Scar mobilization;Dry needling    PT Next Visit Plan  abdominal massage; myofascial release to the restricted tissue; diaphgramatic breathing    PT Home Exercise Plan  progress as needed    Consulted and Agree with Plan of Care  Patient       Patient will benefit from skilled therapeutic intervention in order to improve the following deficits and impairments:  Pain, Increased fascial restricitons, Increased muscle spasms, Decreased activity tolerance  Visit Diagnosis: Other muscle spasm - Plan: PT plan of care cert/re-cert     Problem List Patient Active Problem List   Diagnosis Date Noted  . Lichen sclerosus et atrophicus of the vulva 03/31/2016  .  Abdominal wall mass of right lower quadrant 03/08/2016  . 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 Goals of care, counseling/discussion 02/27/2016  . Elevated CA-125 02/24/2016  . Vaginal dryness, menopausal 10/01/2015  . 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 Leukopenia due to antineoplastic chemotherapy ([place]) 09/02/2015  . Chemotherapy-induced neuropathy ([place]) 08/19/2015  . Cellulitis 07/03/2015  . Acute sensory neuropathy ([place]) 06/26/2015  . High risk medication use 06/21/2015  . Facial paresthesia 06/18/2015  . Genetic testing 06/16/2015  . Restless legs 05/14/2015  . Family history of breast cancer in female 04/29/2015  . Chemotherapy-induced nausea 04/22/2015  . Chemotherapy induced neutropenia ([place]) 04/22/2015  . Cancer of right fallopian tube ([place]) 04/07/2015  . History of hysterectomy for benign disease 03/21/2015  . IBS (irritable bowel syndrome) 03/21/2015  . Dense breast tissue 03/21/2015  . History of cholecystectomy 03/21/2015  . Abdominal carcinomatosis ([place]) 03/21/2015    Earlie Counts, PT 04/21/17  3:40 PM    [facility] [address] [place], [place], [address] Phone: [phone]   Fax:  [phone]  Name: Areen Trautner MRN: [id number] Date of Birth: 1965/07/04

## 2017-04-27 NOTE — Progress Notes (Signed)
[place]  Telephone:([phone] Fax:([phone]     ID: Kathryn Ramsey DOB: 11/27/1965  MR#: [id number]  GYI#:[id number]  Patient Care Team: Harlan Stains, MD as PCP - General (Family Medicine) Gordy Levan, MD as Consulting Physician (Oncology) [name], MD as Consulting Physician (Obstetrics and Gynecology) Juanita Craver, MD as Consulting Physician (Gastroenterology) Servando Salina, MD as Consulting Physician (Obstetrics and Gynecology) Nickie Retort, MD as Consulting Physician (Urology) Gillis Ends, MD as Referring Physician (Obstetrics and Gynecology) OTHER MD: Festus Aloe [phone])  CHIEF COMPLAINT: Recurrent ovarian cancer  CURRENT TREATMENT: Gemcitabine and bevacizumab started 02/04/2017  INTERVAL HISTORY: Kathryn Ramsey returns today for a follow-up and treatment of her recurrent ovarian cancer.  She had been receiving gemcitabine on days 1 and 8 and bevacizumab on days 1 of each 21-day cycle, but with the last cycle we omitted the gemcitabine.  Her Ca1 25 had dropped considerably with the chemo/immunotherapy combination, but it has now risen and so we are resuming the chemotherapy.   REVIEW OF SYSTEMS: Kathryn Ramsey is doing well and recently came back from [place], which she enjoyed very much. She saw the PA, Lucianne Lei for abdominal pain and reports at that time she had bloody mucus in stool. Her abdominal pain has improved and she has an appointment with Dr. Collene Mares next week. She has also been going to a physical therapist, who states that her abdomin feels "sticky." She also had mouth sores for about 1.5 weeks, which have resolved.  She denies unusual headaches, visual changes, nausea, vomiting, or dizziness. There has been no unusual cough, phlegm production, or pleurisy. This been no change in bladder habits. She denies unexplained fatigue or unexplained weight loss, rash, or fever. A detailed review of systems was otherwise  noncontributory.    HISTORY OF PRESENT ILLNESS: From Dr. Serita Grit original intake note 03/17/2015:  "Kathryn Ramsey is a very pleasant G4P4 who is seen in consultation at the request of Dr Collene Mares and Dr Garwin Brothers for peritoneal carcinomatosis. The patient has a history of a workup by urology for gross hematuria which included a pelvic examine was suggested for extrinsic mass effect on the bladder on cystoscopy. Cystoscopy took place on in December 2016. The patient denies abdominal pain bloating early satiety or abdominal distention.  On 08/03/2015 at [facility] she underwent a CT scan of the abdomen and pelvis as ordered by Dr Baruch Gouty. This revealed a 1.4 cm low attenuation lesion on the posterior right hepatic lobe suggestive of a hemangioma. Status post hysterectomy. No ovarian masses. Moderate ascites. Omental caking seen in the lateral left abdomen and pelvis. Peritoneal nodularity in the left lateral pelvic cul-de-sac. No gross extrinsic compression on the bladder was identified on imaging.  The patient was then seen by her gastroenterologist, Dr. Collene Mares, who performed a colonoscopy which was unremarkable.  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 She was treated adjuvantly with carboplatin and paclitaxel, as detailed below. Her CA 125 dropped from 1226 on 03/20/2015 to 26.1 by 06/16/2015.  Her subsequent history is as detailed below.  I PAST MEDICAL HISTORY: Past Medical History:  Diagnosis Date  . Acute sensory neuropathy ([place]) 06/26/2015  . Allergy   . Anemia   .  Asthma    illness induced asthma  . Blood transfusion without reported diagnosis     at age 68 years old D/T surgery to femur being crushed  . 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 Heart murmur    pt, states had a "working heart murmur"  . History of kidney stones   . PONV (postoperative nausea and vomiting)   . Skin cancer     PAST SURGICAL HISTORY: Past Surgical History:  Procedure Laterality Date  . 3 laporscopic proceedures    . ABDOMINAL HYSTERECTOMY     2010  . CHOLECYSTECTOMY     2010  . DILATION AND CURETTAGE OF UTERUS     1997  . LAPAROSCOPY N/A 03/27/2015   Procedure: DIAGNOSTIC LAPAROSCOPY ;  Surgeon: [name], MD;  Location: [facility];  Service: Gynecology;  Laterality: N/A;  . LAPAROSCOPY N/A 02/24/2016   Procedure: LAPAROSCOPY DIAGNOSTIC WITH PERITONEAL WASHINGS AND PERITONEAL BIOPSY;  Surgeon: [name], MD;  [facility];  Service: Gynecology;  Laterality: N/A;  . LAPAROTOMY N/A 03/27/2015   Procedure: EXPLORATORY LAPAROTOMY, BILATERAL SALPINGO OOPHORECTOMY, OMENTECTOMY, RADICAL TUMOR DEBULKING;  Surgeon: [name], MD;  [facility]: [facility];  Service: Gynecology;  Laterality: N/A;  . sinus surgery 1994      FAMILY HISTORY Family History  Problem Relation Age of Onset  . Hypertension Father   . Skin cancer Father        nonmelanoma skin cancers in his late 77s  . Non-Hodgkin's lymphoma Maternal Aunt        dx. 62s; smoker  . Stroke Maternal Grandmother   . Other Mother        benign meningioma dx. early-mid-70s; hysterectomy in her late 82s for heavy periods - still has ovaries  . Other Son        one son with pre-cancerous skin findings  . Other Daughter        cysts on ovaries and hx of heavy periods  . Other Sister        hysterectomy for cysts - still has ovaries  . Heart attack Maternal Grandfather   . Heart attack Paternal Grandfather   . Renal cancer Maternal Aunt 60       smoker  . Breast cancer Maternal Aunt 78  . Other Maternal Aunt        dx.  benign brain tumor (meningioma) at 40; 2nd benign brain tumor in her late 74s; hx of radical hysterectomy at age 44  . Brain cancer Other        NOS tumor  The patient's parents are both living, in their late 48s as of January 2018. The patient has one brother, 3 sisters. There is no history of ovarian cancer in the family. One maternal aunt had breast and kidney cancer, another had non-Hodgkin's lymphoma.  GYNECOLOGIC HISTORY:  No LMP recorded. Patient has had a hysterectomy. 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 Her husband Kathryn Ramsey is a Engineer, maintenance (IT). Their children are 26, 23, 52 and 72 y/o as of JAN 2018. The oldest works as an Electrical engineer in the [employer] in [place]. The other 3 children live in [place], the youngest currently attending [employer]. Rhe patient has no grandchildren. She attends [employer]    ADVANCED DIRECTIVES:  HEALTH MAINTENANCE: Social History   Tobacco Use  . Smoking status: Never Smoker  . Smokeless tobacco: Never Used  Substance Use Topics  . Alcohol use: Yes    Comment:  3 glasses wine or beer/week  . Drug use: No     Colonoscopy:2016  PAP: s/p hyst  Bone density:   Allergies  Allergen Reactions  . Bee Venom Shortness Of Breath    swelling  . Sulfa Antibiotics Shortness Of Breath    Vomit , diarrhea , hives  . Iodinated Diagnostic Agents Other (See Comments)    01/27/17 Pt reports sneezing and congestion. Pt states she takes claritin as a premed for CT scan in [place]. Premedicated with Benadryl 25 mg PO. Post CT,no sneezing but had mild drainage/congestion which resolved after a few minutes with oral fluids.    Current Outpatient Medications  Medication Sig Dispense Refill  . albuterol (PROVENTIL HFA;VENTOLIN HFA) 108 (90 Base) MCG/ACT inhaler Inhale 2 puffs into the lungs every 6 (six) hours as needed for wheezing or shortness of  breath. 1 Inhaler 2  . benzonatate (TESSALON) 200 MG capsule Take 1 capsule (200 mg total) by mouth 3 (three) times daily as needed for cough. 30 capsule 2  . famotidine (PEPCID) 20 MG tablet Take 20 mg by mouth daily.    [name] gabapentin (NEURONTIN) 100 MG capsule Take 1 capsule (100 mg total) by mouth at bedtime. Takes 167m at bedtime. Takes an additional 1075mduring the day if needed for nerve pain. (Patient not taking: Reported on 04/20/2017) 90 capsule 4  . ondansetron (ZOFRAN) 8 MG tablet Take 1 tablet (8 mg total) by mouth 2 (two) times daily as needed (Nausea or vomiting). (Patient not taking: Reported on 04/20/2017) 30 tablet 1   No current facility-administered medications for this visit.     OBJECTIVE: Middle-aged white woman who appears well  Vitals:   04/29/17 1258  BP: 121/79  Pulse: 72  Resp: 18  Temp: 98.5 F (36.9 C)  SpO2: 100%     Body mass index is 21.04 kg/m.      ECOG FS:0 - Asymptomatic  Sclerae unicteric, EOMs intact Oropharynx clear and moist No cervical or supraclavicular adenopathy Lungs no rales or rhonchi Heart regular rate and rhythm Abd soft, nontender, positive bowel sounds; the previously noted laparoscopy site recurrences are not palpable; there is no palpable abnormality in the right lower quadrant where she feels a change "inside". MSK no focal spinal tenderness, no upper extremity lymphedema Neuro: nonfocal, well oriented, appropriate affect Breasts: Deferred   LAB RESULTS:  CMP     Component Value Date/Time   NA 138 04/28/2017 0804   NA 140 02/11/2017 0755   K 4.3 04/28/2017 0804   K 4.4 02/11/2017 0755   CL 105 04/28/2017 0804   CO2 24 04/28/2017 0804   CO2 26 02/11/2017 0755   GLUCOSE 93 04/28/2017 0804   GLUCOSE 93 02/11/2017 0755   BUN 11 04/28/2017 0804   BUN 7.9 02/11/2017 0755   CREATININE 0.74 04/28/2017 0804   CREATININE 0.72 03/25/2017 0832   CREATININE 0.7 02/11/2017 0755   CALCIUM 9.8 04/28/2017 0804   CALCIUM 9.6  02/11/2017 0755   PROT 7.7 04/28/2017 0804   PROT 7.5 02/11/2017 0755   ALBUMIN 4.1 04/28/2017 0804   ALBUMIN 4.4 02/11/2017 0755   AST 26 04/28/2017 0804   AST 74 (H) 03/25/2017 0832   AST 27 02/11/2017 0755   ALT 26 04/28/2017 0804   ALT 155 (H) 03/25/2017  0832   ALT 24 02/11/2017 0755   ALKPHOS 63 04/28/2017 0804   ALKPHOS 58 02/11/2017 0755   BILITOT 0.5 04/28/2017 0804   BILITOT 0.4 03/25/2017 0832   BILITOT 0.43 02/11/2017 0755   GFRNONAA >60 04/28/2017 0804   GFRNONAA >60 03/25/2017 0832   GFRAA >60 04/28/2017 0804   GFRAA >60 03/25/2017 0832    INo results found for: SPEP, UPEP  Lab Results  Component Value Date   WBC 5.2 04/28/2017   NEUTROABS 1.9 04/28/2017   HGB 13.0 04/28/2017   HCT 40.1 04/28/2017   MCV 100.3 04/28/2017   PLT 296 04/28/2017      Chemistry      Component Value Date/Time   NA 138 04/28/2017 0804   NA 140 02/11/2017 0755   K 4.3 04/28/2017 0804   K 4.4 02/11/2017 0755   CL 105 04/28/2017 0804   CO2 24 04/28/2017 0804   CO2 26 02/11/2017 0755   BUN 11 04/28/2017 0804   BUN 7.9 02/11/2017 0755   CREATININE 0.74 04/28/2017 0804   CREATININE 0.72 03/25/2017 0832   CREATININE 0.7 02/11/2017 0755      Component Value Date/Time   CALCIUM 9.8 04/28/2017 0804   CALCIUM 9.6 02/11/2017 0755   ALKPHOS 63 04/28/2017 0804   ALKPHOS 58 02/11/2017 0755   AST 26 04/28/2017 0804   AST 74 (H) 03/25/2017 0832   AST 27 02/11/2017 0755   ALT 26 04/28/2017 0804   ALT 155 (H) 03/25/2017 0832   ALT 24 02/11/2017 0755   BILITOT 0.5 04/28/2017 0804   BILITOT 0.4 03/25/2017 0832   BILITOT 0.43 02/11/2017 0755       No results found for: LABCA2  No components found for: [id number]  No results for input(s): INR in the last 168 hours.  Urinalysis    Component Value Date/Time   COLORURINE STRAW (A) 04/08/2017 1123   APPEARANCEUR CLEAR 04/08/2017 1123   LABSPEC 1.003 (L) 04/08/2017 1123   LABSPEC 1.005 10/09/2015 1239   PHURINE 8.0 04/08/2017  1123   GLUCOSEU NEGATIVE 04/08/2017 1123   GLUCOSEU Negative 10/09/2015 1239   HGBUR NEGATIVE 04/08/2017 1123   BILIRUBINUR NEGATIVE 04/08/2017 1123   BILIRUBINUR Negative 10/09/2015 1239   KETONESUR NEGATIVE 04/08/2017 1123   PROTEINUR NEGATIVE 04/15/2017 0836   UROBILINOGEN 0.2 10/09/2015 1239   NITRITE NEGATIVE 04/08/2017 1123   LEUKOCYTESUR NEGATIVE 04/08/2017 1123   LEUKOCYTESUR Negative 10/09/2015 1239     STUDIES:  Mm Screening Breast Tomo Bilateral  Result Date: 04/21/2017 CLINICAL DATA:  Screening. EXAM: DIGITAL SCREENING BILATERAL MAMMOGRAM WITH TOMO AND CAD COMPARISON:  Previous exam(s). ACR Breast Density Category c: The breast tissue is heterogeneously dense, which may obscure small masses. FINDINGS: There are no findings suspicious for malignancy. Images were processed with CAD. IMPRESSION: No mammographic evidence of malignancy. A result letter of this screening mammogram will be mailed directly to the patient. RECOMMENDATION: Screening mammogram in one year. (Code:SM-B-01Y) BI-RADS CATEGORY  1: Negative. Electronically Signed   By: Ammie Ferrier M.D.   On: 04/21/2017 17:06     ELIGIBLE FOR AVAILABLE RESEARCH PROTOCOL:  discussing protocols at [facility]: 52 y.o. [place] woman status post radical tumor debulking with optimal cytoreduction (R0) 03/27/2015 for a stage IIIC, high-grade right fallopian tube carcinoma  (a) baseline CA-125 was1226.  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)  (1)  adjuvant chemotherapy consisted of carboplatin and paclitaxel for 6 doses, begun 04/14/2015, completed 08/11/2015  (a) paclitaxel was omitted from cycle 5 and dose reduced on cycle 6 because of neuropathy   (b) a "make up" dose of paclitaxel was given 08/25/2015  (c) last carboplatin dose was  08/11/2015  (d) CA-125 normalized by 06/16/2015   (2) FIRST RECURRENCE: January 2018  (a) CA-125 rise beginning November 2017 led to CT scans and PET scans December 2017, all negative  (b) exploratory laparotomy 02/24/2016 showed pathologically confirmed recurrence, with miliary disease involving all examined surfaces  (c) port-site metastases noted 03/08/2016  (3) carboplatin/liposomal doxorubicin started 03/05/2016, repeated every three weeks x4, completed 06/04/2016.  (a) cycle 3 delayed one week because of neutropenia; OnPro added  (4) RUCAPARIB started 07/02/2016  (a) restaging 10/01/2016: Normal CA 125, negative CT of the abdomen and pelvis  (b) labs 11/24/2016 shows a rise in her CA 125-43.4, with continuing rise thereafter  (c) rucaparib discontinued October 2018  (5) anastrozole started December 23, 2016-stopped after a couple of weeks  SECOND RECURRENCE: (6) Gemcitabine/Bevacizumab started on 02/04/2017  (a) gemcitabine omitted cycle 4 because of intercurrent infection  (5) gemcitabine resumed with cycle 5, with intervening rising Ca1 25  PLAN Jermiya has recovered from her mucositis, her mucousy/bloody diarrhea, and her cough.  In fact she feels a looks terrific.  The issue is whether to continue bevacizumab alone or to add gemcitabine  Incidentally her epistaxis is almost completely resolved and she has no other sites of bleeding  We reviewed the Ca1 25, which showed a significant drop after cycle 3, but now rise after omitting gemcitabine from cycle 4.  The obvious interpretation is that she was responding very nicely to the chemo but that the tumor is rising without the chemo.  An alternative explanation is that the very low reading last time was wrong and that she is continuing to respond.  Of these 2 possibilities the first 1 is a safer one and that is the one we are going with  Accordingly she will resume gemcitabine today together with bevacizumab.  In addition I am  adding Valtrex given her recent mucositis.  I asked her to take it for the next 5 days and then as needed.  Her sixth cycle will begin 05/20/2016 and I will see her that day.  She will have her lab work a couple of days before so that we can review the Ca1 25 results prior to the treatment.  She knows to call for any other issues that may develop before her next visit here.  Magrinat, Virgie Dad, MD  04/29/17 1:07 PM Medical Oncology and Hematology [facility] [address] [place], [place] [address] Tel. [phone]    Fax. [phone]  This document serves as a record of services personally performed by Chauncey Cruel, MD. It was created on his behalf by Margit Banda, a trained medical scribe. The creation of this record is based on the scribe's personal observations and the provider's statements to them.   I have reviewed the above documentation for accuracy and completeness, and I agree with the above.

## 2017-04-28 ENCOUNTER — Ambulatory Visit: Payer: 59 | Admitting: Physical Therapy

## 2017-04-28 ENCOUNTER — Inpatient Hospital Stay: Payer: 59

## 2017-04-28 ENCOUNTER — Encounter: Payer: Self-pay | Admitting: Physical Therapy

## 2017-04-28 DIAGNOSIS — C5701 Malignant neoplasm of right fallopian tube: Secondary | ICD-10-CM

## 2017-04-28 DIAGNOSIS — Z5111 Encounter for antineoplastic chemotherapy: Secondary | ICD-10-CM | POA: Diagnosis not present

## 2017-04-28 DIAGNOSIS — M6281 Muscle weakness (generalized): Secondary | ICD-10-CM

## 2017-04-28 DIAGNOSIS — M62838 Other muscle spasm: Secondary | ICD-10-CM | POA: Diagnosis not present

## 2017-04-28 DIAGNOSIS — C762 Malignant neoplasm of abdomen: Secondary | ICD-10-CM

## 2017-04-28 DIAGNOSIS — C801 Malignant (primary) neoplasm, unspecified: Secondary | ICD-10-CM

## 2017-04-28 DIAGNOSIS — C786 Secondary malignant neoplasm of retroperitoneum and peritoneum: Secondary | ICD-10-CM

## 2017-04-28 LAB — CBC WITH DIFFERENTIAL/PLATELET
Basophils Absolute: 0 10*3/uL (ref 0.0–0.1)
Basophils Relative: 0 %
Eosinophils Absolute: 0.2 10*3/uL (ref 0.0–0.5)
Eosinophils Relative: 4 %
HCT: 40.1 % (ref 34.8–46.6)
Hemoglobin: 13 g/dL (ref 11.6–15.9)
Lymphocytes Relative: 48 %
Lymphs Abs: 2.5 10*3/uL (ref 0.9–3.3)
MCH: 32.5 pg (ref 25.1–34.0)
MCHC: 32.4 g/dL (ref 31.5–36.0)
MCV: 100.3 fL (ref 79.5–101.0)
Monocytes Absolute: 0.6 10*3/uL (ref 0.1–0.9)
Monocytes Relative: 11 %
Neutro Abs: 1.9 10*3/uL (ref 1.5–6.5)
Neutrophils Relative %: 37 %
Platelets: 296 10*3/uL (ref 145–400)
RBC: 4 MIL/uL (ref 3.70–5.45)
RDW: 15.4 % — ABNORMAL HIGH (ref 11.2–14.5)
WBC: 5.2 10*3/uL (ref 3.9–10.3)

## 2017-04-28 LAB — COMPREHENSIVE METABOLIC PANEL
ALT: 26 U/L (ref 0–55)
AST: 26 U/L (ref 5–34)
Albumin: 4.1 g/dL (ref 3.5–5.0)
Alkaline Phosphatase: 63 U/L (ref 40–150)
Anion gap: 9 (ref 3–11)
BUN: 11 mg/dL (ref 7–26)
CO2: 24 mmol/L (ref 22–29)
Calcium: 9.8 mg/dL (ref 8.4–10.4)
Chloride: 105 mmol/L (ref 98–109)
Creatinine, Ser: 0.74 mg/dL (ref 0.60–1.10)
GFR calc Af Amer: >60 mL/min (ref >60)
GFR calc non Af Amer: >60 mL/min (ref >60)
Glucose, Bld: 93 mg/dL (ref 70–140)
Potassium: 4.3 mmol/L (ref 3.5–5.1)
Sodium: 138 mmol/L (ref 136–145)
Total Bilirubin: 0.5 mg/dL (ref 0.2–1.2)
Total Protein: 7.7 g/dL (ref 6.4–8.3)

## 2017-04-28 NOTE — Therapy (Signed)
[facility] [address] [place], [place], [address] Phone: [phone]   Fax:  [phone]  Physical Therapy Treatment  Patient Details  Name: Kathryn Ramsey MRN: [id number] Date of Birth: [birth year] Referring Provider: Dr. Lurline Del   Encounter Date: 04/28/2017  PT End of Session - 04/28/17 1233    Visit Number  2    Date for PT Re-Evaluation  07/14/17    Authorization Type  [employer]    Authorization - Visit Number  2    Authorization - Number of Visits  23    PT Start Time  3716    PT Stop Time  1225    PT Time Calculation (min)  40 min    Activity Tolerance  Patient tolerated treatment well    Behavior During Therapy  [facility] for tasks assessed/performed       Past Medical History:  Diagnosis Date  . Acute sensory neuropathy ([place]) 06/26/2015  . Allergy   . Anemia   . Asthma    illness induced asthma  . Blood transfusion without reported diagnosis    at age 5 years old D/T surgery to femur being crushed  . 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 Heart murmur    pt, states had a "working heart murmur"  . History of kidney stones   . PONV (postoperative nausea and vomiting)   . Skin cancer     Past Surgical History:  Procedure Laterality Date  . 3 laporscopic proceedures    . ABDOMINAL HYSTERECTOMY     2010  . CHOLECYSTECTOMY     2010  . DILATION AND CURETTAGE OF UTERUS     1997  . LAPAROSCOPY N/A 03/27/2015   Procedure: DIAGNOSTIC LAPAROSCOPY ;  Surgeon: [name], MD;  Location: [facility];  Service: Gynecology;  Laterality: N/A;  . LAPAROSCOPY N/A 02/24/2016   Procedure: LAPAROSCOPY DIAGNOSTIC WITH PERITONEAL WASHINGS AND PERITONEAL BIOPSY;  Surgeon: [name], MD;  Location: [facility];  Service: Gynecology;  Laterality: N/A;  . LAPAROTOMY N/A 03/27/2015   Procedure: EXPLORATORY LAPAROTOMY, BILATERAL SALPINGO OOPHORECTOMY, OMENTECTOMY,  RADICAL TUMOR DEBULKING;  Surgeon: [name], MD;  Location: [facility];  Service: Gynecology;  Laterality: N/A;  . sinus surgery 1994      There were no vitals filed for this visit.  Subjective Assessment - 04/28/17 1150    Subjective  After last visit my stomach felt good for several hours.  Everyday in the morning I have been doing the massage. A good bowel movement helps.     Patient Stated Goals  reduce abdominal discomfort    Currently in Pain?  Yes    Pain Score  3     Pain Location  Abdomen    Pain Orientation  Lower;Anterior    Pain Descriptors / Indicators  Discomfort    Pain Type  Acute pain    Pain Onset  More than a month ago    Pain Frequency  Constant    Aggravating Factors   standing    Pain Relieving Factors  2 extra strength tylenol helped with pain; relax or lay down    Multiple Pain Sites  No                      [facility] Adult PT Treatment/Exercise - 04/28/17 0001      Manual Therapy   Manual  Therapy  Myofascial release;Soft tissue mobilization    Myofascial Release  respiratory and urogenital diaphgram release through the 3 planes of fascia with one hand anteriorly and one posteriorly; release along the midline by the umbilicus, release along the right  psoas area               PT Short Term Goals - 04/21/17 1532      PT SHORT TERM GOAL #1   Title  independent with scar massage to improve tissue mobility    Period  Weeks    Status  New    Target Date  05/19/17      PT SHORT TERM GOAL #2   Title  understand how to perform abdominal massage to promote peristalic mobility to improve constipation    Time  4    Period  Weeks    Status  New    Target Date  05/19/17        PT Long Term Goals - 04/21/17 1533      PT LONG TERM GOAL #1   Title  independent with HEP    Time  4    Period  Months    Status  New    Target Date  07/14/17      PT LONG TERM GOAL #2   Title  reduction of abdominal pain as she is standing due tp reduction  of fascial tightness    Time  12    Period  Weeks    Status  New    Target Date  07/14/17      PT LONG TERM GOAL #3   Title  mobility of abdominal scars improved >/= 75% so patient is able to contract lower abdominals     Time  4    Period  Months    Status  Achieved    Target Date  07/14/17      PT LONG TERM GOAL #4   Title  ----      PT LONG TERM GOAL #5   Title  -----            Plan - 04/28/17 1230    Clinical Impression Statement  Patient felt better for 3 days after last visit.  Patient had increased bowel sounds during the myofascial release.  Patient had incraeased softness of the abdominal tissue.  Patient will benefit from skilled therapy to reduce abdominal pain when she is standing and during daily activities.     Rehab Potential  Excellent    Clinical Impairments Affecting Rehab Potential  recurrent cancer with last episode 02/04/2017; presently taking chemotherapy    PT Frequency  2x / week    PT Duration  12 weeks    PT Treatment/Interventions  Therapeutic activities;Therapeutic exercise;Patient/family education;Neuromuscular re-education;Manual techniques;Scar mobilization;Dry needling    PT Next Visit Plan  abdominal massage; myofascial release to the restricted tissue; diaphgramatic breathing    PT Home Exercise Plan  progress as needed    Recommended Other Services  MD signed initial eval    Consulted and Agree with Plan of Care  Patient       Patient will benefit from skilled therapeutic intervention in order to improve the following deficits and impairments:  Pain, Increased fascial restricitons, Increased muscle spasms, Decreased activity tolerance  Visit Diagnosis: Other muscle spasm  Muscle weakness (generalized)     Problem List Patient Active Problem List   Diagnosis Date Noted  . Lichen sclerosus et atrophicus of the vulva 03/31/2016  .  Abdominal wall mass of right lower quadrant 03/08/2016  . 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 Goals of care, counseling/discussion 02/27/2016  . Elevated CA-125 02/24/2016  . Vaginal dryness, menopausal 10/01/2015  . Drug-induced peripheral neuropathy ([place]) 09/02/2015  . Leukopenia due to antineoplastic chemotherapy ([place]) 09/02/2015  . Chemotherapy-induced neuropathy ([place]) 08/19/2015  . Cellulitis 07/03/2015  . Acute sensory neuropathy ([place]) 06/26/2015  . High risk medication use 06/21/2015  . Facial paresthesia 06/18/2015  . Genetic testing 06/16/2015  . Restless legs 05/14/2015  . Family history of breast cancer in female 04/29/2015  . Chemotherapy-induced nausea 04/22/2015  . Chemotherapy induced neutropenia ([place]) 04/22/2015  . Cancer of right fallopian tube ([place]) 04/07/2015  . History of hysterectomy for benign disease 03/21/2015  . IBS (irritable bowel syndrome) 03/21/2015  . Dense breast tissue 03/21/2015  . History of cholecystectomy 03/21/2015  . 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, [place], [place], [address] Phone: [phone]   Fax:  [phone]  Name: Kathryn Ramsey MRN: [id number] Date of Birth: [birth year]

## 2017-04-29 ENCOUNTER — Inpatient Hospital Stay: Payer: 59

## 2017-04-29 ENCOUNTER — Inpatient Hospital Stay (HOSPITAL_BASED_OUTPATIENT_CLINIC_OR_DEPARTMENT_OTHER): Payer: 59 | Admitting: Oncology

## 2017-04-29 ENCOUNTER — Telehealth: Payer: Self-pay

## 2017-04-29 VITALS — BP 121/79 | HR 72 | Temp 98.5°F | Resp 18 | Ht 63.0 in | Wt 118.8 lb

## 2017-04-29 DIAGNOSIS — Z5111 Encounter for antineoplastic chemotherapy: Secondary | ICD-10-CM | POA: Diagnosis not present

## 2017-04-29 DIAGNOSIS — C786 Secondary malignant neoplasm of retroperitoneum and peritoneum: Secondary | ICD-10-CM | POA: Diagnosis not present

## 2017-04-29 DIAGNOSIS — C5701 Malignant neoplasm of right fallopian tube: Secondary | ICD-10-CM

## 2017-04-29 DIAGNOSIS — Z7189 Other specified counseling: Secondary | ICD-10-CM

## 2017-04-29 DIAGNOSIS — R040 Epistaxis: Secondary | ICD-10-CM

## 2017-04-29 DIAGNOSIS — C762 Malignant neoplasm of abdomen: Secondary | ICD-10-CM

## 2017-04-29 DIAGNOSIS — C801 Malignant (primary) neoplasm, unspecified: Secondary | ICD-10-CM

## 2017-04-29 LAB — CA 125: Cancer Antigen (CA) 125: 485.5 U/mL — ABNORMAL HIGH (ref 0.0–38.1)

## 2017-04-29 MED ORDER — LORAZEPAM 1 MG PO TABS
ORAL_TABLET | ORAL | Status: AC
  Filled 2017-04-29: qty 1

## 2017-04-29 MED ORDER — SODIUM CHLORIDE 0.9% FLUSH
10.0000 mL | INTRAVENOUS | Status: DC | PRN
  Filled 2017-04-29: qty 10

## 2017-04-29 MED ORDER — LORAZEPAM 2 MG/ML IJ SOLN
INTRAMUSCULAR | Status: AC
  Filled 2017-04-29: qty 1

## 2017-04-29 MED ORDER — PROCHLORPERAZINE MALEATE 10 MG PO TABS
ORAL_TABLET | ORAL | Status: AC
Start: 2017-04-29 — End: 2017-04-29
  Filled 2017-04-29: qty 1

## 2017-04-29 MED ORDER — HEPARIN SOD (PORK) LOCK FLUSH 100 UNIT/ML IV SOLN
500.0000 [IU] | Freq: Once | INTRAVENOUS | Status: DC | PRN
  Filled 2017-04-29: qty 5

## 2017-04-29 MED ORDER — VALACYCLOVIR HCL 500 MG PO TABS: 500.0000 mg | ORAL_TABLET | Freq: Every day | ORAL | 4 refills | Status: DC

## 2017-04-29 MED ORDER — SODIUM CHLORIDE 0.9 % IV SOLN
Freq: Once | INTRAVENOUS | Status: AC
  Administered 2017-04-29: 14:00:00 via INTRAVENOUS

## 2017-04-29 MED ORDER — BEVACIZUMAB CHEMO INJECTION 400 MG/16ML
14.9000 mg/kg | Freq: Once | INTRAVENOUS | Status: AC
  Administered 2017-04-29: 800 mg via INTRAVENOUS
  Filled 2017-04-29: qty 32

## 2017-04-29 MED ORDER — LORAZEPAM 2 MG/ML IJ SOLN
0.5000 mg | Freq: Once | INTRAMUSCULAR | Status: AC
  Administered 2017-04-29: 0.5 mg via INTRAVENOUS

## 2017-04-29 MED ORDER — PROCHLORPERAZINE MALEATE 10 MG PO TABS
10.0000 mg | ORAL_TABLET | Freq: Once | ORAL | Status: AC
  Administered 2017-04-29: 10 mg via ORAL

## 2017-04-29 MED ORDER — SODIUM CHLORIDE 0.9 % IV SOLN
800.0000 mg/m2 | Freq: Once | INTRAVENOUS | Status: AC
  Administered 2017-04-29: 1254 mg via INTRAVENOUS
  Filled 2017-04-29: qty 32.98

## 2017-04-29 NOTE — Telephone Encounter (Signed)
Printed avs and calender per 3/15 los, also patient insisted on having lab a day before infus.

## 2017-04-29 NOTE — Patient Instructions (Signed)
[facility] Discharge Instructions for Patients Receiving Chemotherapy  Today you received the following chemotherapy agents Avastin and Gemzar  To help prevent nausea and vomiting after your treatment, we encourage you to take your nausea medication as directed   If you develop nausea and vomiting that is not controlled by your nausea medication, call the clinic.   BELOW ARE SYMPTOMS THAT SHOULD BE REPORTED IMMEDIATELY:  *FEVER GREATER THAN 100.5 F  *CHILLS WITH OR WITHOUT FEVER  NAUSEA AND VOMITING THAT IS NOT CONTROLLED WITH YOUR NAUSEA MEDICATION  *UNUSUAL SHORTNESS OF BREATH  *UNUSUAL BRUISING OR BLEEDING  TENDERNESS IN MOUTH AND THROAT WITH OR WITHOUT PRESENCE OF ULCERS  *URINARY PROBLEMS  *BOWEL PROBLEMS  UNUSUAL RASH Items with * indicate a potential emergency and should be followed up as soon as possible.  Feel free to call the clinic should you have any questions or concerns. The clinic phone number is [phone].  Please show the CHEMO ALERT CARD at check-in to the Emergency Department and triage nurse.   

## 2017-05-03 ENCOUNTER — Encounter: Payer: Self-pay | Admitting: Physical Therapy

## 2017-05-03 ENCOUNTER — Ambulatory Visit: Payer: 59 | Admitting: Physical Therapy

## 2017-05-03 DIAGNOSIS — M62838 Other muscle spasm: Secondary | ICD-10-CM | POA: Diagnosis not present

## 2017-05-03 DIAGNOSIS — M6281 Muscle weakness (generalized): Secondary | ICD-10-CM

## 2017-05-04 NOTE — Therapy (Signed)
[facility] [address], [place], [place], [address] Phone: [phone]   Fax:  [phone]  Physical Therapy Treatment  Patient Details  Name: Kathryn Ramsey MRN: [id number] Date of Birth: August 21, 1965 Referring Provider: Dr. Lurline Del   Encounter Date: 05/03/2017  PT End of Session - 05/03/17 0759    Visit Number  3    Date for PT Re-Evaluation  07/14/17    Authorization Type  UHC    Authorization - Visit Number  3    Authorization - Number of Visits  23    PT Start Time  1400    PT Stop Time  1440    PT Time Calculation (min)  40 min    Activity Tolerance  Patient tolerated treatment well    Behavior During Therapy  [facility] for tasks assessed/performed       Past Medical History:  Diagnosis Date  . Acute sensory neuropathy ([place]) 06/26/2015  . Allergy   . Anemia   . Asthma    illness induced asthma  . Blood transfusion without reported diagnosis    at age 1 years old D/T surgery to femur being crushed  . 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 Heart murmur    pt, states had a "working heart murmur"  . History of kidney stones   . PONV (postoperative nausea and vomiting)   . Skin cancer     Past Surgical History:  Procedure Laterality Date  . 3 laporscopic proceedures    . ABDOMINAL HYSTERECTOMY     2010  . CHOLECYSTECTOMY     2010  . DILATION AND CURETTAGE OF UTERUS     1997  . LAPAROSCOPY N/A 03/27/2015   Procedure: DIAGNOSTIC LAPAROSCOPY ;  Surgeon: [name], MD;  Location: [facility];  Service: Gynecology;  Laterality: N/A;  . LAPAROSCOPY N/A 02/24/2016   Procedure: LAPAROSCOPY DIAGNOSTIC WITH PERITONEAL WASHINGS AND PERITONEAL BIOPSY;  Surgeon: [name], MD;  Location: [facility];  Service: Gynecology;  Laterality: N/A;  . LAPAROTOMY N/A 03/27/2015   Procedure: EXPLORATORY LAPAROTOMY, BILATERAL SALPINGO OOPHORECTOMY, OMENTECTOMY,  RADICAL TUMOR DEBULKING;  Surgeon: [name], MD;  Location: [facility];  Service: Gynecology;  Laterality: N/A;  . sinus surgery 1994      There were no vitals filed for this visit.  Subjective Assessment - 05/03/17 1407    Subjective  Afternoon after treatment I feel better but it will come back.  I am seeing a GI doctor today. My numbers have bumped up last week.     Patient Stated Goals  reduce abdominal discomfort    Currently in Pain?  Yes    Pain Score  3     Pain Location  Abdomen    Pain Orientation  Lower;Anterior    Pain Descriptors / Indicators  Discomfort    Pain Type  Acute pain    Pain Onset  More than a month ago    Pain Frequency  Constant    Aggravating Factors   standing    Pain Relieving Factors  2 extra strength tylenol helped with pain; relax or lay down    Multiple Pain Sites  No                                PT Short Term Goals - 04/21/17 1532  PT SHORT TERM GOAL #1   Title  independent with scar massage to improve tissue mobility    Period  Weeks    Status  New    Target Date  05/19/17      PT SHORT TERM GOAL #2   Title  understand how to perform abdominal massage to promote peristalic mobility to improve constipation    Time  4    Period  Weeks    Status  New    Target Date  05/19/17        PT Long Term Goals - 04/21/17 1533      PT LONG TERM GOAL #1   Title  independent with HEP    Time  4    Period  Months    Status  New    Target Date  07/14/17      PT LONG TERM GOAL #2   Title  reduction of abdominal pain as she is standing due tp reduction of fascial tightness    Time  12    Period  Weeks    Status  New    Target Date  07/14/17      PT LONG TERM GOAL #3   Title  mobility of abdominal scars improved >/= 75% so patient is able to contract lower abdominals     Time  4    Period  Months    Status  Achieved    Target Date  07/14/17      PT LONG TERM GOAL #4   Title  ----      PT LONG TERM GOAL #5    Title  -----            Plan - 05/03/17 1410    Clinical Impression Statement  Patient has increased abdominal sounds with treatment.  Patient had more room in her pants after therapy and increased softness in abdomen.  Patient has relief for several hours after therapy.  Patient will benefit from skilled therapy to reduce abdominal pain when she is standing and during daily activities.     Rehab Potential  Excellent    Clinical Impairments Affecting Rehab Potential  recurrent cancer with last episode 02/04/2017; presently taking chemotherapy    PT Frequency  2x / week    PT Duration  12 weeks    PT Treatment/Interventions  Therapeutic activities;Therapeutic exercise;Patient/family education;Neuromuscular re-education;Manual techniques;Scar mobilization;Dry needling    PT Next Visit Plan  abdominal massage; myofascial release to the restricted tissue; diaphgramatic breathing    PT Home Exercise Plan  progress as needed    Consulted and Agree with Plan of Care  Patient       Patient will benefit from skilled therapeutic intervention in order to improve the following deficits and impairments:  Pain, Increased fascial restricitons, Increased muscle spasms, Decreased activity tolerance  Visit Diagnosis: Other muscle spasm  Muscle weakness (generalized)     Problem List Patient Active Problem List   Diagnosis Date Noted  . Lichen sclerosus et atrophicus of the vulva 03/31/2016  . Abdominal wall mass of right lower quadrant 03/08/2016  . 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 Goals of care, counseling/discussion 02/27/2016  . Elevated CA-125 02/24/2016  . Vaginal dryness, menopausal 10/01/2015  . Drug-induced peripheral neuropathy ([place]) 09/02/2015  . Leukopenia due to antineoplastic chemotherapy ([place]) 09/02/2015  . Chemotherapy-induced neuropathy ([place]) 08/19/2015  . Cellulitis 07/03/2015  . Acute sensory neuropathy ([place]) 06/26/2015  . High risk medication use  06/21/2015  . Facial paresthesia  06/18/2015  . Genetic testing 06/16/2015  . Restless legs 05/14/2015  . Family history of breast cancer in female 04/29/2015  . Chemotherapy-induced nausea 04/22/2015  . Chemotherapy induced neutropenia ([place]) 04/22/2015  . 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 History of hysterectomy for benign disease 03/21/2015  . IBS (irritable bowel syndrome) 03/21/2015  . Dense breast tissue 03/21/2015  . History of cholecystectomy 03/21/2015  . 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 [place], [place], [address] Phone: [phone]   Fax:  [phone]  Name: Kathryn [facility] MRN: [id number] Date of Birth: [birth year]

## 2017-05-05 ENCOUNTER — Encounter: Payer: Self-pay | Admitting: Physical Therapy

## 2017-05-05 ENCOUNTER — Ambulatory Visit: Payer: 59 | Admitting: Physical Therapy

## 2017-05-05 ENCOUNTER — Encounter: Payer: Self-pay | Admitting: *Deleted

## 2017-05-05 DIAGNOSIS — M6281 Muscle weakness (generalized): Secondary | ICD-10-CM

## 2017-05-05 DIAGNOSIS — M62838 Other muscle spasm: Secondary | ICD-10-CM | POA: Diagnosis not present

## 2017-05-05 NOTE — Therapy (Signed)
[facility] [address] [place], [place], [address] Phone: [phone]   Fax:  [phone]  Physical Therapy Treatment  Patient Details  Name: Kathryn Ramsey MRN: [id number] Date of Birth: February 04, 1966 Referring Provider: Dr. Lurline [facility]   Encounter Date: 05/05/2017  PT End of Session - 05/05/17 1528    Visit Number  4    Date for PT Re-Evaluation  07/14/17    Authorization Type  UHC    Authorization - Visit Number  4    Authorization - Number of Visits  23    PT Start Time  1572    PT Stop Time  1525    PT Time Calculation (min)  40 min    Activity Tolerance  Patient tolerated treatment well    Behavior During Therapy  [facility] for tasks assessed/performed       Past Medical History:  Diagnosis Date  . Acute sensory neuropathy ([place]) 06/26/2015  . Allergy   . Anemia   . Asthma    illness induced asthma  . Blood transfusion without reported diagnosis    at age 13 years old D/T surgery to femur being crushed  . 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 Heart murmur    pt, states had a "working heart murmur"  . History of kidney stones   . PONV (postoperative nausea and vomiting)   . Skin cancer     Past Surgical History:  Procedure Laterality Date  . 3 laporscopic proceedures    . ABDOMINAL HYSTERECTOMY     2010  . CHOLECYSTECTOMY     2010  . DILATION AND CURETTAGE OF UTERUS     1997  . LAPAROSCOPY N/A 03/27/2015   Procedure: DIAGNOSTIC LAPAROSCOPY ;  Surgeon: Kathryn Amber, MD;  [facility];  Service: Gynecology;  Laterality: N/A;  . LAPAROSCOPY N/A 02/24/2016   Procedure: LAPAROSCOPY DIAGNOSTIC WITH PERITONEAL WASHINGS AND PERITONEAL BIOPSY;  Surgeon: Kathryn Amber, MD;  [facility];  Service: Gynecology;  Laterality: N/A;  . LAPAROTOMY N/A 03/27/2015   Procedure: EXPLORATORY LAPAROTOMY, BILATERAL SALPINGO OOPHORECTOMY, OMENTECTOMY,  RADICAL TUMOR DEBULKING;  Surgeon: Kathryn Amber, MD;  [facility];  Service: Gynecology;  Laterality: N/A;  . sinus surgery 1994      There were no vitals filed for this visit.  Subjective Assessment - 05/05/17 1449    Subjective  I saw the GI doctor and she is prescribing a muscle relaxer.  She thinks her abdominal pain is musculature. I felt good after last visit and through the day and does not stay that way the next day.  My abdomen feels soft and better but the tenderness comes back.     Patient Stated Goals  reduce abdominal discomfort    Currently in Pain?  Yes    Pain Score  3     Pain Location  Abdomen    Pain Orientation  Lower;Anterior    Pain Descriptors / Indicators  Discomfort    Pain Type  Acute pain    Pain Onset  More than a month ago    Pain Frequency  Constant    Aggravating Factors   standing    Pain Relieving Factors  2 extra strength tylenol helped with pain; relax or lay down    Multiple Pain Sites  No  [place] Adult PT Treatment/Exercise - 05/05/17 0001      Manual Therapy   Manual Therapy  Soft tissue mobilization;Myofascial release    Soft tissue mobilization  circular massage to abdomen to promote peristalic motion of the intestines to reduce pain and bloating    Myofascial Release  along the middle and upper and lower abdoment to release the tissue with girgling noises               PT Short Term Goals - 05/05/17 1531      PT SHORT TERM GOAL #1   Title  independent with scar massage to improve tissue mobility    Time  4    Period  Weeks    Status  Achieved      PT SHORT TERM GOAL #2   Title  understand how to perform abdominal massage to promote peristalic mobility to improve constipation    Time  4    Period  Weeks    Status  Achieved      PT SHORT TERM GOAL #3   Title  understand how to toilet correctly to reduce strain on pelvic floor and ability to relax the pelvic floor     Time  4    Period   Weeks    Status  Achieved      PT SHORT TERM GOAL #4   Title  ability to perfrom a pelvic floor contraction with a lift due to pelvic floor tone decreased and muscle able to fully contract    Time  4    Period  Weeks    Status  Achieved      PT SHORT TERM GOAL #5   Title  understand how to perform self perineal massage to elongate pelvic floor muscles for relaxation    Time  4    Period  Weeks    Status  Achieved        PT Long Term Goals - 04/21/17 1533      PT LONG TERM GOAL #1   Title  independent with HEP    Time  4    Period  Months    Status  New    Target Date  07/14/17      PT LONG TERM GOAL #2   Title  reduction of abdominal pain as she is standing due tp reduction of fascial tightness    Time  12    Period  Weeks    Status  New    Target Date  07/14/17      PT LONG TERM GOAL #3   Title  mobility of abdominal scars improved >/= 75% so patient is able to contract lower abdominals     Time  4    Period  Months    Status  Achieved    Target Date  07/14/17      PT LONG TERM GOAL #4   Title  ----      PT LONG TERM GOAL #5   Title  -----            Plan - 05/05/17 1528    Clinical Impression Statement  Patient has increased gurgling noise in the abdomen with myofascial release.  Patient feels better till the end of the day but will feel tight the next day. The [facility] of the abdomen was tighte this time. Patient will be taking muscle relaxers to see if it will reduce the tenderness in her abdoment.  Patient will benefit from  skilled therapy to reduce abdominal pain when she is standing and during daily activities.     Rehab Potential  Excellent    Clinical Impairments Affecting Rehab Potential  recurrent cancer with last episode 02/04/2017; presently taking chemotherapy    PT Frequency  2x / week    PT Duration  12 weeks    PT Treatment/Interventions  Therapeutic activities;Therapeutic exercise;Patient/family education;Neuromuscular re-education;Manual  techniques;Scar mobilization;Dry needling    PT Next Visit Plan  abdominal massage; myofascial release to the restricted tissue; diaphgramatic breathing    PT Home Exercise Plan  progress as needed    Consulted and Agree with Plan of Care  Patient       Patient will benefit from skilled therapeutic intervention in order to improve the following deficits and impairments:  Pain, Increased fascial restricitons, Increased muscle spasms, Decreased activity tolerance  Visit Diagnosis: Other muscle spasm  Muscle weakness (generalized)     Problem List Patient Active Problem List   Diagnosis Date Noted  . Lichen sclerosus et atrophicus of the vulva 03/31/2016  . Abdominal wall mass of right lower quadrant 03/08/2016  . 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 Goals of care, counseling/discussion 02/27/2016  . Elevated CA-125 02/24/2016  . Vaginal dryness, menopausal 10/01/2015  . Drug-induced peripheral neuropathy ([place]) 09/02/2015  . Leukopenia due to antineoplastic chemotherapy ([place]) 09/02/2015  . Chemotherapy-induced neuropathy ([place]) 08/19/2015  . Cellulitis 07/03/2015  . Acute sensory neuropathy ([place]) 06/26/2015  . High risk medication use 06/21/2015  . Facial paresthesia 06/18/2015  . Genetic testing 06/16/2015  . Restless legs 05/14/2015  . Family history of breast cancer in female 04/29/2015  . Chemotherapy-induced nausea 04/22/2015  . Chemotherapy induced neutropenia ([place]) 04/22/2015  . 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 History of hysterectomy for benign disease 03/21/2015  . IBS (irritable bowel syndrome) 03/21/2015  . Dense breast tissue 03/21/2015  . History of cholecystectomy 03/21/2015  . 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, [place], [place], [address] Phone: [phone]   Fax:  [phone]  Name: Kathryn [facility] MRN: [id number] Date of Birth: 11-Apr-1965

## 2017-05-06 ENCOUNTER — Inpatient Hospital Stay: Payer: 59

## 2017-05-06 VITALS — BP 121/72 | HR 61 | Temp 98.3°F | Resp 18

## 2017-05-06 DIAGNOSIS — C786 Secondary malignant neoplasm of retroperitoneum and peritoneum: Secondary | ICD-10-CM

## 2017-05-06 DIAGNOSIS — C5701 Malignant neoplasm of right fallopian tube: Secondary | ICD-10-CM

## 2017-05-06 DIAGNOSIS — C801 Malignant (primary) neoplasm, unspecified: Principal | ICD-10-CM

## 2017-05-06 DIAGNOSIS — C762 Malignant neoplasm of abdomen: Secondary | ICD-10-CM

## 2017-05-06 DIAGNOSIS — Z5111 Encounter for antineoplastic chemotherapy: Secondary | ICD-10-CM | POA: Diagnosis not present

## 2017-05-06 DIAGNOSIS — Z7189 Other specified counseling: Secondary | ICD-10-CM

## 2017-05-06 LAB — CBC WITH DIFFERENTIAL (CANCER CENTER ONLY)
Basophils Absolute: 0 10*3/uL (ref 0.0–0.1)
Basophils Relative: 1 %
Eosinophils Absolute: 0 10*3/uL (ref 0.0–0.5)
Eosinophils Relative: 1 %
HCT: 33.9 % — ABNORMAL LOW (ref 34.8–46.6)
Hemoglobin: 11.2 g/dL — ABNORMAL LOW (ref 11.6–15.9)
Lymphocytes Relative: 55 %
Lymphs Abs: 1.6 10*3/uL (ref 0.9–3.3)
MCH: 32.2 pg (ref 25.1–34.0)
MCHC: 33.2 g/dL (ref 31.5–36.0)
MCV: 97.2 fL (ref 79.5–101.0)
Monocytes Absolute: 0.2 10*3/uL (ref 0.1–0.9)
Monocytes Relative: 6 %
Neutro Abs: 1 10*3/uL — ABNORMAL LOW (ref 1.5–6.5)
Neutrophils Relative %: 37 %
Platelet Count: 188 10*3/uL (ref 145–400)
RBC: 3.48 MIL/uL — ABNORMAL LOW (ref 3.70–5.45)
RDW: 16.1 % — ABNORMAL HIGH (ref 11.2–14.5)
WBC Count: 2.8 10*3/uL — ABNORMAL LOW (ref 3.9–10.3)

## 2017-05-06 MED ORDER — LORAZEPAM 2 MG/ML IJ SOLN
0.5000 mg | Freq: Once | INTRAMUSCULAR | Status: AC
  Administered 2017-05-06: 0.5 mg via INTRAVENOUS

## 2017-05-06 MED ORDER — PROCHLORPERAZINE MALEATE 10 MG PO TABS
10.0000 mg | ORAL_TABLET | Freq: Once | ORAL | Status: AC
  Administered 2017-05-06: 10 mg via ORAL

## 2017-05-06 MED ORDER — PEGFILGRASTIM 6 MG/0.6ML ~~LOC~~ PSKT
6.0000 mg | PREFILLED_SYRINGE | Freq: Once | SUBCUTANEOUS | Status: AC
  Administered 2017-05-06: 6 mg via SUBCUTANEOUS

## 2017-05-06 MED ORDER — PROCHLORPERAZINE MALEATE 10 MG PO TABS
ORAL_TABLET | ORAL | Status: AC
  Filled 2017-05-06: qty 1

## 2017-05-06 MED ORDER — LORAZEPAM 2 MG/ML IJ SOLN
INTRAMUSCULAR | Status: AC
  Filled 2017-05-06: qty 1

## 2017-05-06 MED ORDER — PEGFILGRASTIM 6 MG/0.6ML ~~LOC~~ PSKT
PREFILLED_SYRINGE | SUBCUTANEOUS | Status: AC
  Filled 2017-05-06: qty 0.6

## 2017-05-06 MED ORDER — SODIUM CHLORIDE 0.9 % IV SOLN
800.0000 mg/m2 | Freq: Once | INTRAVENOUS | Status: AC
  Administered 2017-05-06: 1254 mg via INTRAVENOUS
  Filled 2017-05-06: qty 32.98

## 2017-05-06 MED ORDER — SODIUM CHLORIDE 0.9 % IV SOLN
Freq: Once | INTRAVENOUS | Status: AC
  Administered 2017-05-06: 11:00:00 via INTRAVENOUS

## 2017-05-06 NOTE — Progress Notes (Signed)
Per Dr. Jana Hakim, ok to proceed with treatment with current ANC 1.0.

## 2017-05-06 NOTE — Progress Notes (Signed)
Per Dr. Andrey Spearman to proceed with CMP from 3/14.

## 2017-05-06 NOTE — Patient Instructions (Signed)
[facility] °Discharge Instructions for Patients Receiving Chemotherapy ° °Today you received the following chemotherapy agents Gemzar ° °To help prevent nausea and vomiting after your treatment, we encourage you to take your nausea medication as directed. °  °If you develop nausea and vomiting that is not controlled by your nausea medication, call the clinic.  ° °BELOW ARE SYMPTOMS THAT SHOULD BE REPORTED IMMEDIATELY: °· *FEVER GREATER THAN 100.5 F °· *CHILLS WITH OR WITHOUT FEVER °· NAUSEA AND VOMITING THAT IS NOT CONTROLLED WITH YOUR NAUSEA MEDICATION °· *UNUSUAL SHORTNESS OF BREATH °· *UNUSUAL BRUISING OR BLEEDING °· TENDERNESS IN MOUTH AND THROAT WITH OR WITHOUT PRESENCE OF ULCERS °· *URINARY PROBLEMS °· *BOWEL PROBLEMS °· UNUSUAL RASH °Items with * indicate a potential emergency and should be followed up as soon as possible. ° °Feel free to call the clinic should you have any questions or concerns. The clinic phone number is [phone]. ° °Please show the CHEMO ALERT CARD at check-in to the Emergency Department and triage nurse. ° ° °

## 2017-05-10 ENCOUNTER — Encounter: Payer: 59 | Admitting: Physical Therapy

## 2017-05-12 ENCOUNTER — Ambulatory Visit: Payer: 59 | Admitting: Physical Therapy

## 2017-05-12 ENCOUNTER — Encounter: Payer: 59 | Admitting: Physical Therapy

## 2017-05-12 ENCOUNTER — Encounter: Payer: Self-pay | Admitting: Physical Therapy

## 2017-05-12 DIAGNOSIS — M62838 Other muscle spasm: Secondary | ICD-10-CM

## 2017-05-12 DIAGNOSIS — M6281 Muscle weakness (generalized): Secondary | ICD-10-CM

## 2017-05-12 NOTE — Therapy (Signed)
[facility] [address] [place], [place], [address] Phone: [phone]   Fax:  [phone]  Physical Therapy Treatment  Patient Details  Name: Kathryn [facility] MRN: [id number] Date of Birth: 04-Mar-1965 Referring Provider: Dr. Lurline Del   Encounter Date: 05/12/2017  PT End of Session - 05/12/17 1141    Visit Number  5    Date for PT Re-Evaluation  07/14/17    Authorization Type  UHC    Authorization - Visit Number  5    Authorization - Number of Visits  23    PT Start Time  1100    PT Stop Time  1142    PT Time Calculation (min)  42 min    Activity Tolerance  Patient tolerated treatment well    Behavior During Therapy  [facility] for tasks assessed/performed       Past Medical History:  Diagnosis Date  . Acute sensory neuropathy ([place]) 06/26/2015  . Allergy   . Anemia   . Asthma    illness induced asthma  . Blood transfusion without reported diagnosis    at age 64 years old D/T surgery to femur being crushed  . 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 Heart murmur    pt, states had a "working heart murmur"  . History of kidney stones   . PONV (postoperative nausea and vomiting)   . Skin cancer     Past Surgical History:  Procedure Laterality Date  . 3 laporscopic proceedures    . ABDOMINAL HYSTERECTOMY     2010  . CHOLECYSTECTOMY     2010  . DILATION AND CURETTAGE OF UTERUS     1997  . LAPAROSCOPY N/A 03/27/2015   Procedure: DIAGNOSTIC LAPAROSCOPY ;  Surgeon: [name], MD;  [facility];  Service: Gynecology;  Laterality: N/A;  . LAPAROSCOPY N/A 02/24/2016   Procedure: LAPAROSCOPY DIAGNOSTIC WITH PERITONEAL WASHINGS AND PERITONEAL BIOPSY;  Surgeon: [name], MD;  [facility];  Service: Gynecology;  Laterality: N/A;  . LAPAROTOMY N/A 03/27/2015   Procedure: EXPLORATORY LAPAROTOMY, BILATERAL SALPINGO OOPHORECTOMY, OMENTECTOMY,  RADICAL TUMOR DEBULKING;  Surgeon: [name], MD;  [facility];  Service: Gynecology;  Laterality: N/A;  . sinus surgery 1994      There were no vitals filed for this visit.  Subjective Assessment - 05/12/17 1104    Subjective  Last week when you worked in the center of my stomach and made a difference and lasted.     Patient Stated Goals  reduce abdominal discomfort    Currently in Pain?  Yes    Pain Score  8     Pain Location  Scapula    Pain Orientation  Left    Pain Descriptors / Indicators  Sharp    Pain Type  Acute pain    Pain Onset  More than a month ago    Pain Frequency  Constant    Aggravating Factors   laying on back and movement    Pain Relieving Factors  heat    Multiple Pain Sites  No                No data recorded       [facility] Adult PT Treatment/Exercise - 05/12/17 0001      Modalities   Modalities  Moist Heat      Moist Heat Therapy  Number Minutes Moist Heat  20 Minutes    Moist Heat Location  Other (comment) upper back      Manual Therapy   Manual Therapy  Soft tissue mobilization;Myofascial release    Soft tissue mobilization  circular massage to abdomen to promote peristalic motion of the intestines to reduce pain and bloating    Myofascial Release  bil. diaphgram, along the rectus, along the middle and upper and lower abdoment to release the tissue with girgling noises               PT Short Term Goals - 05/12/17 1143      PT SHORT TERM GOAL #1   Title  independent with scar massage to improve tissue mobility    Time  4    Period  Weeks    Status  Achieved      PT SHORT TERM GOAL #2   Title  understand how to perform abdominal massage to promote peristalic mobility to improve constipation    Time  4    Period  Weeks    Status  Achieved        PT Long Term Goals - 05/12/17 1144      PT LONG TERM GOAL #1   Title  independent with HEP    Baseline  still learning    Time  4    Period  Months    Status   On-going      PT LONG TERM GOAL #2   Title  reduction of abdominal pain as she is standing due tp reduction of fascial tightness    Baseline  first time pain reduction lasted    Time  12    Period  Weeks    Status  On-going      PT LONG TERM GOAL #3   Title  mobility of abdominal scars improved >/= 75% so patient is able to contract lower abdominals     Time  4    Period  Months    Status  Achieved            Plan - 05/12/17 1145    Clinical Impression Statement  Patient able to have several days with decreased pain after last session.  After therapy the abdominal region was flat instead of swelling in lower abdomen.  Tightness located in mid abdomen.  Good bowel sounds during treatment.  Patient will benefit from skilled therapy to reduce abdominal pain when she is standing and during activities.     Rehab Potential  Excellent    Clinical Impairments Affecting Rehab Potential  recurrent cancer with last episode 02/04/2017; presently taking chemotherapy    PT Frequency  2x / week    PT Duration  12 weeks    PT Treatment/Interventions  Therapeutic activities;Therapeutic exercise;Patient/family education;Neuromuscular re-education;Manual techniques;Scar mobilization;Dry needling    PT Next Visit Plan  abdominal massage; myofascial release to the restricted tissue;    PT Home Exercise Plan  progress as needed    Consulted and Agree with Plan of Care  Patient       Patient will benefit from skilled therapeutic intervention in order to improve the following deficits and impairments:  Pain, Increased fascial restricitons, Increased muscle spasms, Decreased activity tolerance  Visit Diagnosis: Other muscle spasm  Muscle weakness (generalized)     Problem List Patient Active Problem List   Diagnosis Date Noted  . Lichen sclerosus et atrophicus of the vulva 03/31/2016  . Abdominal wall mass of right lower  quadrant 03/08/2016  . 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  Goals of care, counseling/discussion 02/27/2016  . Elevated CA-125 02/24/2016  . Vaginal dryness, menopausal 10/01/2015  . Drug-induced peripheral neuropathy ([place]) 09/02/2015  . Leukopenia due to antineoplastic chemotherapy ([place]) 09/02/2015  . Chemotherapy-induced neuropathy ([place]) 08/19/2015  . Cellulitis 07/03/2015  . Acute sensory neuropathy ([place]) 06/26/2015  . High risk medication use 06/21/2015  . Facial paresthesia 06/18/2015  . Genetic testing 06/16/2015  . Restless legs 05/14/2015  . Family history of breast cancer in female 04/29/2015  . Chemotherapy-induced nausea 04/22/2015  . Chemotherapy induced neutropenia ([place]) 04/22/2015  . 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 History of hysterectomy for benign disease 03/21/2015  . IBS (irritable bowel syndrome) 03/21/2015  . Dense breast tissue 03/21/2015  . History of cholecystectomy 03/21/2015  . 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, [place], [place], [address] Phone: [phone]   Fax:  [phone]  Name: Kathryn [facility] MRN: [id number] Date of Birth: 05/29/1965

## 2017-05-17 ENCOUNTER — Other Ambulatory Visit: Payer: Self-pay

## 2017-05-17 ENCOUNTER — Encounter: Payer: Self-pay | Admitting: Physical Therapy

## 2017-05-17 ENCOUNTER — Ambulatory Visit: Payer: 59 | Attending: Oncology | Admitting: Physical Therapy

## 2017-05-17 DIAGNOSIS — M6281 Muscle weakness (generalized): Secondary | ICD-10-CM | POA: Diagnosis present

## 2017-05-17 DIAGNOSIS — M62838 Other muscle spasm: Secondary | ICD-10-CM | POA: Diagnosis present

## 2017-05-17 NOTE — Therapy (Signed)
[facility] [address] [place], [place], [address] Phone: [phone]   Fax:  [phone]  Physical Therapy Treatment  Patient Details  Name: Kathryn Ramsey MRN: [id number] Date of Birth: 1965-07-02 Referring Provider: Dr. Lurline Del   Encounter Date: 05/17/2017  PT End of Session - 05/17/17 0926    Visit Number  6    Date for PT Re-Evaluation  07/14/17    Authorization Type  [employer]    Authorization - Visit Number  6    Authorization - Number of Visits  23    PT Start Time  0845    PT Stop Time  0927    PT Time Calculation (min)  42 min    Activity Tolerance  Patient tolerated treatment well    Behavior During Therapy  [facility] for tasks assessed/performed       Past Medical History:  Diagnosis Date  . Acute sensory neuropathy 06/26/2015  . Allergy   . Anemia   . Asthma    illness induced asthma  . Blood transfusion without reported diagnosis    at age 52 years old D/T surgery to femur being crushed  . Complication of anesthesia    hx. of allergic to ether (had surgery at 52 years and had reaction to the ether  . Heart murmur    pt, states had a "working heart murmur"  . History of kidney stones   . PONV (postoperative nausea and vomiting)   . Skin cancer     Past Surgical History:  Procedure Laterality Date  . 3 laporscopic proceedures    . ABDOMINAL HYSTERECTOMY     2010  . CHOLECYSTECTOMY     2010  . DILATION AND CURETTAGE OF UTERUS     1997  . LAPAROSCOPY N/A 03/27/2015   Procedure: DIAGNOSTIC LAPAROSCOPY ;  Surgeon: [name], MD;  Location: [facility];  Service: Gynecology;  Laterality: N/A;  . LAPAROSCOPY N/A 02/24/2016   Procedure: LAPAROSCOPY DIAGNOSTIC WITH PERITONEAL WASHINGS AND PERITONEAL BIOPSY;  Surgeon: [name], MD;  Location: [facility];  Service: Gynecology;  Laterality: N/A;  . LAPAROTOMY N/A 03/27/2015   Procedure: EXPLORATORY LAPAROTOMY, BILATERAL SALPINGO OOPHORECTOMY, OMENTECTOMY,  RADICAL TUMOR DEBULKING;  Surgeon: [name], MD;  Location: [facility];  Service: Gynecology;  Laterality: N/A;  . sinus surgery 1994      There were no vitals filed for this visit.  Subjective Assessment - 05/17/17 0853    Subjective  My stomach has not felt well since yesterday.  I continue to feel after bowel movement and my body is in shock.  It wants to cramp and feels tramatized.     Patient Stated Goals  reduce abdominal discomfort    Currently in Pain?  Yes    Pain Score  3     Pain Location  Abdomen    Pain Orientation  Mid    Pain Descriptors / Indicators  Discomfort;Tightness    Pain Type  Acute pain    Pain Onset  More than a month ago    Pain Frequency  Constant    Aggravating Factors   after bowel movement    Pain Relieving Factors  massage    Multiple Pain Sites  No                       [facility] Adult PT Treatment/Exercise - 05/17/17 0001      Moist Heat Therapy   Number Minutes Moist Heat  20 Minutes    Moist Heat Location  Other (comment) upper back      Manual Therapy   Manual Therapy  Soft tissue mobilization;Myofascial release    Soft tissue mobilization  circular massage to abdomen to promote peristalic motion of the intestines to reduce pain and bloating    Myofascial Release  bil. diaphgram, along the rectus, along the middle and upper and lower abdoment to release the tissue with girgling noises               PT Short Term Goals - 05/12/17 1143      PT SHORT TERM GOAL #1   Title  independent with scar massage to improve tissue mobility    Time  4    Period  Weeks    Status  Achieved      PT SHORT TERM GOAL #2   Title  understand how to perform abdominal massage to promote peristalic mobility to improve constipation    Time  4    Period  Weeks    Status  Achieved        PT Long Term Goals - 05/17/17 5284      PT LONG TERM GOAL #1   Title  independent with HEP    Baseline  still learning    Time  4    Period  Months     Status  On-going      PT LONG TERM GOAL #2   Title  reduction of abdominal pain as she is standing due tp reduction of fascial tightness    Baseline  first time pain reduction lasted    Time  12    Period  Weeks    Status  On-going      PT LONG TERM GOAL #3   Title  mobility of abdominal scars improved >/= 75% so patient is able to contract lower abdominals     Time  4    Period  Months    Status  Achieved            Plan - 05/17/17 1324    Clinical Impression Statement  Patients pain level decreased to 1/10.  Patient had decreased tightness in the mid abdomen but not like last week.  Patient had less intestinal sounds during treatment.  patietn will benefit from skilled therapy to reduce pain when she is standing and during activities.     Rehab Potential  Excellent    Clinical Impairments Affecting Rehab Potential  recurrent cancer with last episode 02/04/2017; presently taking chemotherapy    PT Duration  12 weeks    PT Treatment/Interventions  Therapeutic activities;Therapeutic exercise;Patient/family education;Neuromuscular re-education;Manual techniques;Scar mobilization;Dry needling    PT Next Visit Plan  abdominal massage; myofascial release to the restricted tissue; try in quadruped to release tissue from spine    PT Home Exercise Plan  progress as needed    Consulted and Agree with Plan of Care  Patient       Patient will benefit from skilled therapeutic intervention in order to improve the following deficits and impairments:  Pain, Increased fascial restricitons, Increased muscle spasms, Decreased activity tolerance  Visit Diagnosis: Other muscle spasm  Muscle weakness (generalized)     Problem List Patient Active Problem List   Diagnosis Date Noted  . Lichen sclerosus et atrophicus of the vulva 03/31/2016  . Abdominal wall mass of right lower quadrant 03/08/2016  . 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 Goals of care, counseling/discussion  02/27/2016  .  Elevated CA-125 02/24/2016  . Vaginal dryness, menopausal 10/01/2015  . Drug-induced peripheral neuropathy ([place]) 09/02/2015  . Leukopenia due to antineoplastic chemotherapy ([place]) 09/02/2015  . Chemotherapy-induced neuropathy ([place]) 08/19/2015  . Cellulitis 07/03/2015  . Acute sensory neuropathy 06/26/2015  . High risk medication use 06/21/2015  . Facial paresthesia 06/18/2015  . Genetic testing 06/16/2015  . Restless legs 05/14/2015  . Family history of breast cancer in female 04/29/2015  . Chemotherapy-induced nausea 04/22/2015  . Chemotherapy induced neutropenia ([place]) 04/22/2015  . 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 History of hysterectomy for benign disease 03/21/2015  . IBS (irritable bowel syndrome) 03/21/2015  . Dense breast tissue 03/21/2015  . History of cholecystectomy 03/21/2015  . 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 [place], [place], [address] Phone: [phone]   Fax:  [phone]  Name: Jerri Glauser MRN: [id number] Date of Birth: 21-Feb-1965

## 2017-05-18 ENCOUNTER — Inpatient Hospital Stay: Payer: 59 | Attending: Oncology

## 2017-05-18 DIAGNOSIS — Z8051 Family history of malignant neoplasm of kidney: Secondary | ICD-10-CM | POA: Insufficient documentation

## 2017-05-18 DIAGNOSIS — Z803 Family history of malignant neoplasm of breast: Secondary | ICD-10-CM | POA: Diagnosis not present

## 2017-05-18 DIAGNOSIS — C786 Secondary malignant neoplasm of retroperitoneum and peritoneum: Secondary | ICD-10-CM | POA: Diagnosis not present

## 2017-05-18 DIAGNOSIS — C762 Malignant neoplasm of abdomen: Secondary | ICD-10-CM

## 2017-05-18 DIAGNOSIS — Z5111 Encounter for antineoplastic chemotherapy: Secondary | ICD-10-CM | POA: Diagnosis not present

## 2017-05-18 DIAGNOSIS — R109 Unspecified abdominal pain: Secondary | ICD-10-CM | POA: Insufficient documentation

## 2017-05-18 DIAGNOSIS — Z5189 Encounter for other specified aftercare: Secondary | ICD-10-CM | POA: Diagnosis present

## 2017-05-18 DIAGNOSIS — Z85828 Personal history of other malignant neoplasm of skin: Secondary | ICD-10-CM | POA: Insufficient documentation

## 2017-05-18 DIAGNOSIS — C5701 Malignant neoplasm of right fallopian tube: Secondary | ICD-10-CM | POA: Diagnosis not present

## 2017-05-18 DIAGNOSIS — Z5112 Encounter for antineoplastic immunotherapy: Secondary | ICD-10-CM | POA: Diagnosis present

## 2017-05-18 DIAGNOSIS — C801 Malignant (primary) neoplasm, unspecified: Secondary | ICD-10-CM

## 2017-05-18 LAB — CBC WITH DIFFERENTIAL/PLATELET
Basophils Absolute: 0.1 10*3/uL (ref 0.0–0.1)
Basophils Relative: 1 %
Eosinophils Absolute: 0 10*3/uL (ref 0.0–0.5)
Eosinophils Relative: 1 %
HCT: 39.9 % (ref 34.8–46.6)
Hemoglobin: 13.1 g/dL (ref 11.6–15.9)
Lymphocytes Relative: 27 %
Lymphs Abs: 2 10*3/uL (ref 0.9–3.3)
MCH: 32.4 pg (ref 25.1–34.0)
MCHC: 32.9 g/dL (ref 31.5–36.0)
MCV: 98.7 fL (ref 79.5–101.0)
Monocytes Absolute: 0.4 10*3/uL (ref 0.1–0.9)
Monocytes Relative: 6 %
Neutro Abs: 4.8 10*3/uL (ref 1.5–6.5)
Neutrophils Relative %: 65 %
Platelets: 237 10*3/uL (ref 145–400)
RBC: 4.04 MIL/uL (ref 3.70–5.45)
RDW: 17.7 % — ABNORMAL HIGH (ref 11.2–14.5)
WBC: 7.3 10*3/uL (ref 3.9–10.3)

## 2017-05-18 LAB — COMPREHENSIVE METABOLIC PANEL
ALT: 114 U/L — ABNORMAL HIGH (ref 0–55)
AST: 59 U/L — ABNORMAL HIGH (ref 5–34)
Albumin: 4.7 g/dL (ref 3.5–5.0)
Alkaline Phosphatase: 117 U/L (ref 40–150)
Anion gap: 11 (ref 3–11)
BUN: 8 mg/dL (ref 7–26)
CO2: 25 mmol/L (ref 22–29)
Calcium: 10.2 mg/dL (ref 8.4–10.4)
Chloride: 103 mmol/L (ref 98–109)
Creatinine, Ser: 0.74 mg/dL (ref 0.60–1.10)
GFR calc Af Amer: >60 mL/min (ref >60)
GFR calc non Af Amer: >60 mL/min (ref >60)
Glucose, Bld: 92 mg/dL (ref 70–140)
Potassium: 4.2 mmol/L (ref 3.5–5.1)
Sodium: 139 mmol/L (ref 136–145)
Total Bilirubin: 0.4 mg/dL (ref 0.2–1.2)
Total Protein: 8.5 g/dL — ABNORMAL HIGH (ref 6.4–8.3)

## 2017-05-19 ENCOUNTER — Encounter: Payer: 59 | Admitting: Physical Therapy

## 2017-05-19 ENCOUNTER — Other Ambulatory Visit: Payer: 59

## 2017-05-19 LAB — CA 125: Cancer Antigen (CA) 125: 560.6 U/mL — ABNORMAL HIGH (ref 0.0–38.1)

## 2017-05-19 NOTE — Progress Notes (Signed)
[facility]  Telephone:([phone]) [phone] Fax:(336) [phone]     ID: Kathryn Ramsey DOB: 13-Jul-1965  MR#: [id number]  VOZ#:[id number]  Patient Care Team: Harlan Stains, MD as PCP - General (Family Medicine) Gordy Levan, MD as Consulting Physician (Oncology) [name], MD as Consulting Physician (Obstetrics and Gynecology) Juanita Craver, MD as Consulting Physician (Gastroenterology) Servando Salina, MD as Consulting Physician (Obstetrics and Gynecology) Nickie Retort, MD as Consulting Physician (Urology) Gillis Ends, MD as Referring Physician (Obstetrics and Gynecology) OTHER MD: Festus Aloe [phone])  CHIEF COMPLAINT: Recurrent ovarian cancer  CURRENT TREATMENT: Gemcitabine and bevacizumab started 02/04/2017  INTERVAL HISTORY: Kathryn Ramsey returns today for a follow-up and treatment of her recurrent ovarian cancer accompanied by her husband.   She is currently being treated with bevacizumab and gemcitabine, today being day 1 of cycle 6.  However her CA 125 has risen in the last 2 determinations, from 411, 485, 560  She is here today to discuss whether to continue current treatment or make a change.   REVIEW OF SYSTEMS: Kathryn Ramsey states the last three weeks have been really great for her. She has been taking Claritin every day, but she has still been experiencing bloody noses every morning. She will put Vaseline in her nose to help with the dryness. Kathryn Ramsey still experiences abdominal tenderness, but she has been going to a lady who massages her abdomin, which gives her relief. Dr. Collene Mares prescribed her the muscle relaxer, Soma but it did not relieve her discomfort. She states the only medication that seems to help is extra strength tylenol, which she will take about 2, every couple days. For exercise she continues to do yoga and hot yoga. She has also started going back to [employer] once a week. She has been having normal bowel movements and adds she  has a great appetite. She denies unusual headaches, visual changes, nausea, vomiting, or dizziness. There has been no unusual cough, phlegm production, or pleurisy. This been no change in bowel or bladder habits. She denies unexplained fatigue or unexplained weight loss, rash, or fever. A detailed review of systems was otherwise noncontributory.    HISTORY OF PRESENT ILLNESS: From Dr. Serita Grit original intake note 03/17/2015:  "Kathryn Ramsey is a very pleasant G4P4 who is seen in consultation at the request of Dr Collene Mares and Dr Garwin Brothers for peritoneal carcinomatosis. The patient has a history of a workup by urology for gross hematuria which included a pelvic examine was suggested for extrinsic mass effect on the bladder on cystoscopy. Cystoscopy took place on in December 2016. The patient denies abdominal pain bloating early satiety or abdominal distention.  On 08/03/2015 at [facility] she underwent a CT scan of the abdomen and pelvis as ordered by Dr Baruch Gouty. This revealed a 1.4 cm low attenuation lesion on the posterior right hepatic lobe suggestive of a hemangioma. Status post hysterectomy. No ovarian masses. Moderate ascites. Omental caking seen in the lateral left abdomen and pelvis. Peritoneal nodularity in the left lateral pelvic cul-de-sac. No gross extrinsic compression on the bladder was identified on imaging.  The patient was then seen by her gastroenterologist, Dr. Collene Mares, who performed a colonoscopy which was unremarkable.  Tumor markers were drawn on 08/04/2015 and these included a CA-125 that was elevated to 957, and a CEA that was normal at 1."  On 03/27/2015 the patient underwent diagnostic laparoscopy, exploratory laparotomy with bilateral salpingo-oophorectomy, omentectomy, and radical tumor debulking with optimal cytoreduction (R0) of what proved to be a stage  IIIc right fallopian tube cancer. She was treated adjuvantly with carboplatin and paclitaxel, as detailed below. Her CA  125 dropped from 1226 on 03/20/2015 to 26.1 by 06/16/2015.  Her subsequent history is as detailed below.  I PAST MEDICAL HISTORY: Past Medical History:  Diagnosis Date  . Acute sensory neuropathy 06/26/2015  . Allergy   . Anemia   . Asthma    illness induced asthma  . Blood transfusion without reported diagnosis    at age 80 years old D/T surgery to femur being crushed  . 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 Heart murmur    pt, states had a "working heart murmur"  . History of kidney stones   . PONV (postoperative nausea and vomiting)   . Skin cancer     PAST SURGICAL HISTORY: Past Surgical History:  Procedure Laterality Date  . 3 laporscopic proceedures    . ABDOMINAL HYSTERECTOMY     2010  . CHOLECYSTECTOMY     2010  . DILATION AND CURETTAGE OF UTERUS     1997  . LAPAROSCOPY N/A 03/27/2015   Procedure: DIAGNOSTIC LAPAROSCOPY ;  Surgeon: [name], MD;  Location: [facility];  Service: Gynecology;  Laterality: N/A;  . LAPAROSCOPY N/A 02/24/2016   Procedure: LAPAROSCOPY DIAGNOSTIC WITH PERITONEAL WASHINGS AND PERITONEAL BIOPSY;  Surgeon: [name], MD;  Location: [facility];  Service: Gynecology;  Laterality: N/A;  . LAPAROTOMY N/A 03/27/2015   Procedure: EXPLORATORY LAPAROTOMY, BILATERAL SALPINGO OOPHORECTOMY, OMENTECTOMY, RADICAL TUMOR DEBULKING;  Surgeon: [name], MD;  Location: [facility];  Service: Gynecology;  Laterality: N/A;  . sinus surgery 1994      FAMILY HISTORY Family History  Problem Relation Age of Onset  . Hypertension Father   . Skin cancer Father        nonmelanoma skin cancers in his late 78s  . Non-Hodgkin's lymphoma Maternal Aunt        dx. 28s; smoker  . Stroke Maternal Grandmother   . Other Mother        benign meningioma dx. early-mid-70s; hysterectomy in her late 55s for heavy periods - still has ovaries  . Other Son        one son with pre-cancerous skin findings  . Other  Daughter        cysts on ovaries and hx of heavy periods  . Other Sister        hysterectomy for cysts - still has ovaries  . Heart attack Maternal Grandfather   . Heart attack Paternal Grandfather   . Renal cancer Maternal Aunt 60       smoker  . Breast cancer Maternal Aunt 78  . Other Maternal Aunt        dx. benign brain tumor (meningioma) at 32; 2nd benign brain tumor in her late 21s; hx of radical hysterectomy at age 11  . Brain cancer Other        NOS tumor  The patient's parents are both living, in their late 22s as of January 2018. The patient has one brother, 3 sisters. There is no history of ovarian cancer in the family. One maternal aunt had breast and kidney cancer, another had non-Hodgkin's lymphoma.  GYNECOLOGIC HISTORY:  No LMP recorded. Patient has had a hysterectomy. 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 Her husband [name] is a Engineer, maintenance (IT). Their children are 33, 69, 8 and 42 y/o as of JAN 2018. The oldest works as an Electrical engineer in the [employer] in [place]. The other 3 children live in [place], the youngest currently attending [employer]. Rhe patient has no grandchildren. She attends [employer]    ADVANCED DIRECTIVES:    HEALTH MAINTENANCE: Social History   Tobacco Use  . Smoking status: Never Smoker  . Smokeless tobacco: Never Used  Substance Use Topics  . Alcohol use: Yes    Comment:  3 glasses wine or beer/week  . Drug use: No     Colonoscopy:2016  PAP: s/p hyst  Bone density:   Allergies  Allergen Reactions  . Bee Venom Shortness Of Breath    swelling  . Sulfa Antibiotics Shortness Of Breath    Vomit , diarrhea , hives  . Iodinated Diagnostic Agents Other (See Comments)    01/27/17 Pt reports sneezing and congestion. Pt states she takes claritin as a premed for CT scan in [place]. Premedicated with Benadryl 25 mg PO. Post CT,no sneezing  but had mild drainage/congestion which resolved after a few minutes with oral fluids.    Current Outpatient Medications  Medication Sig Dispense Refill  . albuterol (PROVENTIL HFA;VENTOLIN HFA) 108 (90 Base) MCG/ACT inhaler Inhale 2 puffs into the lungs every 6 (six) hours as needed for wheezing or shortness of breath. 1 Inhaler 2  . benzonatate (TESSALON) 200 MG capsule Take 1 capsule (200 mg total) by mouth 3 (three) times daily as needed for cough. 30 capsule 2  . famotidine (PEPCID) 20 MG tablet Take 20 mg by mouth daily.    [name] gabapentin (NEURONTIN) 100 MG capsule Take 1 capsule (100 mg total) by mouth at bedtime. Takes 15m at bedtime. Takes an additional 1076mduring the day if needed for nerve pain. (Patient not taking: Reported on 04/20/2017) 90 capsule 4  . ondansetron (ZOFRAN) 8 MG tablet Take 1 tablet (8 mg total) by mouth 2 (two) times daily as needed (Nausea or vomiting). (Patient not taking: Reported on 04/20/2017) 30 tablet 1  . valACYclovir (VALTREX) 500 MG tablet Take 1 tablet (500 mg total) by mouth daily. 90 tablet 4   No current facility-administered medications for this visit.     OBJECTIVE: Middle-aged white woman in no acute distress  Vitals:   05/20/17 0834  BP: 119/60  Pulse: 67  Resp: 18  Temp: 98.1 F (36.7 C)  SpO2: 100%     Body mass index is 21.22 kg/m.      ECOG FS:0 - Asymptomatic  Sclerae unicteric, pupils round and equal Oropharynx clear and moist No cervical or supraclavicular adenopathy Lungs no rales or rhonchi Heart regular rate and rhythm Abd soft, nontender, positive bowel sounds, no masses palpated; the laparoscopy site recurrences are entirely flat and nonpalpable MSK no focal spinal tenderness, no upper extremity lymphedema Neuro: nonfocal, well oriented, appropriate affect Breasts: Deferred    LAB RESULTS:  CMP     Component Value Date/Time   NA 139 05/18/2017 0829   NA 140 02/11/2017 0755   K 4.2 05/18/2017 0829   K 4.4  02/11/2017 0755   CL 103 05/18/2017 0829   CO2 25 05/18/2017 0829   CO2 26 02/11/2017 0755   GLUCOSE 92 05/18/2017 0829   GLUCOSE 93 02/11/2017 0755   BUN 8 05/18/2017 0829   BUN 7.9 02/11/2017 0755   CREATININE 0.74 05/18/2017 0829   CREATININE 0.72 03/25/2017 081660  CREATININE 0.7 02/11/2017 0755   CALCIUM 10.2 05/18/2017 0829   CALCIUM 9.6 02/11/2017 0755   PROT 8.5 (H) 05/18/2017 0829   PROT 7.5 02/11/2017 0755   ALBUMIN 4.7 05/18/2017 0829   ALBUMIN 4.4 02/11/2017 0755   AST 59 (H) 05/18/2017 0829   AST 74 (H) 03/25/2017 0832   AST 27 02/11/2017 0755   ALT 114 (H) 05/18/2017 0829   ALT 155 (H) 03/25/2017 0832   ALT 24 02/11/2017 0755   ALKPHOS 117 05/18/2017 0829   ALKPHOS 58 02/11/2017 0755   BILITOT 0.4 05/18/2017 0829   BILITOT 0.4 03/25/2017 0832   BILITOT 0.43 02/11/2017 0755   GFRNONAA >60 05/18/2017 0829   GFRNONAA >60 03/25/2017 0832   GFRAA >60 05/18/2017 0829   GFRAA >60 03/25/2017 0832    INo results found for: SPEP, UPEP  Lab Results  Component Value Date   WBC 7.3 05/18/2017   NEUTROABS 4.8 05/18/2017   HGB 13.1 05/18/2017   HCT 39.9 05/18/2017   MCV 98.7 05/18/2017   PLT 237 05/18/2017      Chemistry      Component Value Date/Time   NA 139 05/18/2017 0829   NA 140 02/11/2017 0755   K 4.2 05/18/2017 0829   K 4.4 02/11/2017 0755   CL 103 05/18/2017 0829   CO2 25 05/18/2017 0829   CO2 26 02/11/2017 0755   BUN 8 05/18/2017 0829   BUN 7.9 02/11/2017 0755   CREATININE 0.74 05/18/2017 0829   CREATININE 0.72 03/25/2017 0832   CREATININE 0.7 02/11/2017 0755      Component Value Date/Time   CALCIUM 10.2 05/18/2017 0829   CALCIUM 9.6 02/11/2017 0755   ALKPHOS 117 05/18/2017 0829   ALKPHOS 58 02/11/2017 0755   AST 59 (H) 05/18/2017 0829   AST 74 (H) 03/25/2017 0832   AST 27 02/11/2017 0755   ALT 114 (H) 05/18/2017 0829   ALT 155 (H) 03/25/2017 0832   ALT 24 02/11/2017 0755   BILITOT 0.4 05/18/2017 0829   BILITOT 0.4 03/25/2017 0832    BILITOT 0.43 02/11/2017 0755       No results found for: LABCA2  No components found for: [id number]  No results for input(s): INR in the last 168 hours.  Urinalysis    Component Value Date/Time   COLORURINE STRAW (A) 04/08/2017 1123   APPEARANCEUR CLEAR 04/08/2017 1123   LABSPEC 1.003 (L) 04/08/2017 1123   LABSPEC 1.005 10/09/2015 1239   PHURINE 8.0 04/08/2017 1123   GLUCOSEU NEGATIVE 04/08/2017 1123   GLUCOSEU Negative 10/09/2015 1239   HGBUR NEGATIVE 04/08/2017 1123   BILIRUBINUR NEGATIVE 04/08/2017 1123   BILIRUBINUR Negative 10/09/2015 1239   KETONESUR NEGATIVE 04/08/2017 1123   PROTEINUR NEGATIVE 04/15/2017 0836   UROBILINOGEN 0.2 10/09/2015 1239   NITRITE NEGATIVE 04/08/2017 1123   LEUKOCYTESUR NEGATIVE 04/08/2017 1123   LEUKOCYTESUR Negative 10/09/2015 1239     STUDIES:  Mm Screening Breast Tomo Bilateral  Result Date: 04/21/2017 CLINICAL DATA:  Screening. EXAM: DIGITAL SCREENING BILATERAL MAMMOGRAM WITH TOMO AND CAD COMPARISON:  Previous exam(s). ACR Breast Density Category c: The breast tissue is heterogeneously dense, which may obscure small masses. FINDINGS: There are no findings suspicious for malignancy. Images were processed with CAD. IMPRESSION: No mammographic evidence of malignancy. A result letter of this screening mammogram will be mailed directly to the patient. RECOMMENDATION: Screening mammogram in one year. (Code:SM-B-01Y) BI-RADS CATEGORY  1: Negative. Electronically Signed   By: Ammie Ferrier M.D.   On: 04/21/2017 17:06  ELIGIBLE FOR AVAILABLE RESEARCH PROTOCOL:  discussing protocols at [facility]: 52 y.o. [place] woman status post radical tumor debulking with optimal cytoreduction (R0) 03/27/2015 for a stage IIIC, high-grade right fallopian tube carcinoma  (a) baseline CA-125 was1226.  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  (c.1347A>G (p.Lys449Lys)  (1) adjuvant chemotherapy consisted of carboplatin and paclitaxel for 6 doses, begun 04/14/2015, completed 08/11/2015  (a) paclitaxel was omitted from cycle 5 and dose reduced on cycle 6 because of neuropathy   (b) a "make up" dose of paclitaxel was given 08/25/2015  (c) last carboplatin dose was 08/11/2015  (d) CA-125 normalized by 06/16/2015   (2) FIRST RECURRENCE: January 2018  (a) CA-125 rise beginning November 2017 led to CT scans and PET scans December 2017, all negative  (b) exploratory laparotomy 02/24/2016 showed pathologically confirmed recurrence, with miliary disease involving all examined surfaces  (c) port-site metastases noted 03/08/2016  (3) carboplatin/liposomal doxorubicin started 03/05/2016, repeated every three weeks x4, completed 06/04/2016.  (a) cycle 3 delayed one week because of neutropenia; OnPro added  (4) RUCAPARIB started 07/02/2016  (a) restaging 10/01/2016: Normal CA 125, negative CT of the abdomen and pelvis  (b) labs 11/24/2016 shows a rise in her CA 125-43.4, with continuing rise thereafter  (c) rucaparib discontinued October 2018  (5) anastrozole started December 23, 2016-stopped after a couple of weeks  SECOND RECURRENCE: (6) Gemcitabine/Bevacizumab started on 02/04/2017  (a) gemcitabine omitted cycle 4 because of intercurrent infection  (b) gemcitabine resumed with cycle 5, with intervening rise in  Ca 125  (c) with further rise and Ca1 25, carboplatin added to cycle 6  PLAN Hanifah looks great and feels well.  In fact she is doing terrific clinically.  She is tolerating treatment remarkably well.  The fly in the ointment is the rise in the Ca1 25.  I do not think we can blow this off by saying that she missed 1 cycle of Gemzar.  I think this likely does represent some tumor growth  We have 3 options.  We can proceed to cycle 6 as planned and repeat a Ca1 25; we can  restage and if there is measurable disease consider a clinical trial; or we can proceed to cycle 6 but add carboplatin or change to a completely different agent.  After much discussion we decided we are certainly going to restage and I am putting her in for a PET scan to be obtained ASAP.  She will check with her contacts at [facility] and [facility] to see if assuming there is measurable disease a trial is available for her and if so whether she is interested in participating.  In the meantime I am going to add carboplatin to her treatment next week.  This will be the beginning of cycle 6 assuming we continue to treat here.  Of course if she is planning to participate in a trial she will not want to be treated next week.  We will decide that at that time  She has a good understanding of this plan.  She knows to call for any other issues that may develop before the next visit.  Breylan Lefevers, Virgie Dad, MD  05/20/17 8:54 AM Medical Oncology and Hematology [facility] [address] [place], [place] [address] Tel. [phone]    Fax. [phone]  This document serves as a record of  services personally performed by Chauncey Cruel, MD. It was created on his behalf by Margit Banda, a trained medical scribe. The creation of this record is based on the scribe's personal observations and the provider's statements to them.   This document serves as a record of services personally performed by Chauncey Cruel, MD. It was created on his behalf by Margit Banda, a trained medical scribe. The creation of this record is based on the scribe's personal observations and the provider's statements to them.   I have reviewed the above documentation for accuracy and completeness, and I agree with the above.

## 2017-05-20 ENCOUNTER — Inpatient Hospital Stay (HOSPITAL_BASED_OUTPATIENT_CLINIC_OR_DEPARTMENT_OTHER): Payer: 59 | Admitting: Oncology

## 2017-05-20 ENCOUNTER — Inpatient Hospital Stay: Payer: 59

## 2017-05-20 ENCOUNTER — Other Ambulatory Visit: Payer: 59

## 2017-05-20 ENCOUNTER — Telehealth: Payer: Self-pay | Admitting: Oncology

## 2017-05-20 VITALS — BP 119/60 | HR 67 | Temp 98.1°F | Resp 18 | Ht 63.0 in | Wt 119.8 lb

## 2017-05-20 DIAGNOSIS — Z85828 Personal history of other malignant neoplasm of skin: Secondary | ICD-10-CM | POA: Diagnosis not present

## 2017-05-20 DIAGNOSIS — Z803 Family history of malignant neoplasm of breast: Secondary | ICD-10-CM | POA: Diagnosis not present

## 2017-05-20 DIAGNOSIS — Z5111 Encounter for antineoplastic chemotherapy: Secondary | ICD-10-CM | POA: Diagnosis not present

## 2017-05-20 DIAGNOSIS — C786 Secondary malignant neoplasm of retroperitoneum and peritoneum: Secondary | ICD-10-CM

## 2017-05-20 DIAGNOSIS — C762 Malignant neoplasm of abdomen: Secondary | ICD-10-CM

## 2017-05-20 DIAGNOSIS — R109 Unspecified abdominal pain: Secondary | ICD-10-CM | POA: Diagnosis not present

## 2017-05-20 DIAGNOSIS — C5701 Malignant neoplasm of right fallopian tube: Secondary | ICD-10-CM

## 2017-05-20 DIAGNOSIS — Z8051 Family history of malignant neoplasm of kidney: Secondary | ICD-10-CM

## 2017-05-20 DIAGNOSIS — C801 Malignant (primary) neoplasm, unspecified: Secondary | ICD-10-CM

## 2017-05-20 NOTE — Telephone Encounter (Signed)
Patient decline avs and calendar °

## 2017-05-22 ENCOUNTER — Encounter: Payer: Self-pay | Admitting: Oncology

## 2017-05-23 ENCOUNTER — Other Ambulatory Visit: Payer: Self-pay | Admitting: *Deleted

## 2017-05-23 DIAGNOSIS — C762 Malignant neoplasm of abdomen: Secondary | ICD-10-CM

## 2017-05-23 DIAGNOSIS — C786 Secondary malignant neoplasm of retroperitoneum and peritoneum: Secondary | ICD-10-CM

## 2017-05-23 DIAGNOSIS — C5701 Malignant neoplasm of right fallopian tube: Secondary | ICD-10-CM

## 2017-05-23 DIAGNOSIS — C801 Malignant (primary) neoplasm, unspecified: Secondary | ICD-10-CM

## 2017-05-24 ENCOUNTER — Other Ambulatory Visit: Payer: Self-pay

## 2017-05-24 DIAGNOSIS — C762 Malignant neoplasm of abdomen: Secondary | ICD-10-CM

## 2017-05-24 NOTE — Progress Notes (Signed)
Re-clarified orders for CT abd/pelvis and chest - Imaging called, orders placed.

## 2017-05-26 ENCOUNTER — Encounter: Payer: Self-pay | Admitting: Physical Therapy

## 2017-05-26 ENCOUNTER — Encounter: Payer: Self-pay | Admitting: Oncology

## 2017-05-26 ENCOUNTER — Encounter: Payer: 59 | Admitting: Physical Therapy

## 2017-05-26 ENCOUNTER — Ambulatory Visit (HOSPITAL_COMMUNITY)
Admission: RE | Admit: 2017-05-26 | Discharge: 2017-05-26 | Disposition: A | Discharge status: Home / Self Care | Payer: 59 | Source: Ambulatory Visit | Attending: Oncology | Admitting: Oncology

## 2017-05-26 DIAGNOSIS — C762 Malignant neoplasm of abdomen: Secondary | ICD-10-CM | POA: Diagnosis present

## 2017-05-26 MED ORDER — IOHEXOL 300 MG/ML  SOLN
100.0000 mL | Freq: Once | INTRAMUSCULAR | Status: AC | PRN
  Administered 2017-05-26: 100 mL via INTRAVENOUS

## 2017-05-26 NOTE — Progress Notes (Signed)
[facility]  [phone] Fax:([phone]) [phone]     ID: Kathryn Ramsey DOB: 1965-09-08  MR#: [id number]  KGU#:[id number]  Patient Care Team: Harlan Stains, MD as PCP - General (Family Medicine) [name], MD as Consulting Physician (Obstetrics and Gynecology) Juanita Craver, MD as Consulting Physician (Gastroenterology) Servando Salina, MD as Consulting Physician (Obstetrics and Gynecology) Nickie Retort, MD as Consulting Physician (Urology) Gillis Ends, MD as Referring Physician (Obstetrics and Gynecology) OTHER MD: Festus Aloe [phone])  CHIEF COMPLAINT: Recurrent ovarian cancer  CURRENT TREATMENT: Gemcitabine and bevacizumab started 02/04/2017  INTERVAL HISTORY: Kathryn Ramsey returns today for a follow-up and treatment of her recurrent ovarian cancer. She is accompanied by her husband.   Last week we held her treatment because there had been a rise in her CA 125.  We considered either intensifying therapy by adding carboplatin or stopping current treatment and referring for a trial at [facility] depending on results of a CT of the abdomen and pelvis which we set up for her  Yesterday, 05/26/2017, she underwent a CT chest, abdomen, and pelvis with contrast, showing previously noted soft tissue implant in the left pericolic gutter has resolved, there is a new low-attenuation collection intimately associated with segment 6 of the liver which may represent a subhepatic fluid collection or capsular implant. This is associated with an apparent perfusion abnormality in segment 6 of the liver, as well as potential area of intrahepatic biliary ductal dilation. Findings could reflect a focus of metastatic disease either to the liver or the hepatic capsule. No other sites of metastatic disease are noted elsewhere in the chest or pelvis.   She is here today to discuss those results.   REVIEW OF SYSTEMS: [name] is doing well and states she is doing  better than the last time she was here. However, she feels as if there might be some fluid or heaviness in her lower pelvis that is somewhat new. She continues to attend hot yoga classes through out the week and "orange theory "once a week (this is a high intensity interval training program).. She denies unusual headaches, visual changes, nausea, vomiting, or dizziness. There has been no unusual cough, phlegm production, or pleurisy. This been no change in bowel or bladder habits. She denies unexplained fatigue or unexplained weight loss, bleeding, rash, or fever. A detailed review of systems was otherwise noncontributory.    HISTORY OF PRESENT ILLNESS: From Dr. Serita Grit original intake note 03/17/2015:  "[name] is a very pleasant G4P4 who is seen in consultation at the request of Dr Collene Mares and Dr Garwin Brothers for peritoneal carcinomatosis. The patient has a history of a workup by urology for gross hematuria which included a pelvic examine was suggested for extrinsic mass effect on the bladder on cystoscopy. Cystoscopy took place on in December 2016. The patient denies abdominal pain bloating early satiety or abdominal distention.  On 08/03/2015 at [facility] she underwent a CT scan of the abdomen and pelvis as ordered by Dr Baruch Gouty. This revealed a 1.4 cm low attenuation lesion on the posterior right hepatic lobe suggestive of a hemangioma. Status post hysterectomy. No ovarian masses. Moderate ascites. Omental caking seen in the lateral left abdomen and pelvis. Peritoneal nodularity in the left lateral pelvic cul-de-sac. No gross extrinsic compression on the bladder was identified on imaging.  The patient was then seen by her gastroenterologist, Dr. Collene Mares, who performed a colonoscopy which was unremarkable.  Tumor markers were drawn on 08/04/2015 and these included a CA-125  that was elevated to 957, and a CEA that was normal at 1."  On 03/27/2015 the patient underwent diagnostic laparoscopy,  exploratory laparotomy with bilateral salpingo-oophorectomy, omentectomy, and radical tumor debulking with optimal cytoreduction (R0) of what proved to be a stage IIIc right fallopian tube cancer. She was treated adjuvantly with carboplatin and paclitaxel, as detailed below. Her CA 125 dropped from 1226 on 03/20/2015 to 26.1 by 06/16/2015.  Her subsequent history is as detailed below.  I PAST MEDICAL HISTORY: Past Medical History:  Diagnosis Date  . Acute sensory neuropathy 06/26/2015  . Allergy   . Anemia   . Asthma    illness induced asthma  . Blood transfusion without reported diagnosis    at age 52 years old D/T surgery to femur being crushed  . 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 Heart murmur    pt, states had a "working heart murmur"  . History of kidney stones   . PONV (postoperative nausea and vomiting)   . Skin cancer     PAST SURGICAL HISTORY: Past Surgical History:  Procedure Laterality Date  . 3 laporscopic proceedures    . ABDOMINAL HYSTERECTOMY     2010  . CHOLECYSTECTOMY     2010  . DILATION AND CURETTAGE OF UTERUS     1997  . LAPAROSCOPY N/A 03/27/2015   Procedure: DIAGNOSTIC LAPAROSCOPY ;  Surgeon: [name], MD;  Location: [facility];  Service: Gynecology;  Laterality: N/A;  . LAPAROSCOPY N/A 02/24/2016   Procedure: LAPAROSCOPY DIAGNOSTIC WITH PERITONEAL WASHINGS AND PERITONEAL BIOPSY;  Surgeon: [name], MD;  Location: [facility];  Service: Gynecology;  Laterality: N/A;  . LAPAROTOMY N/A 03/27/2015   Procedure: EXPLORATORY LAPAROTOMY, BILATERAL SALPINGO OOPHORECTOMY, OMENTECTOMY, RADICAL TUMOR DEBULKING;  Surgeon: [name], MD;  Location: [facility];  Service: Gynecology;  Laterality: N/A;  . sinus surgery 1994      FAMILY HISTORY Family History  Problem Relation Age of Onset  . Hypertension Father   . Skin cancer Father        nonmelanoma skin cancers in his late 75s  . Non-Hodgkin's  lymphoma Maternal Aunt        dx. 59s; smoker  . Stroke Maternal Grandmother   . Other Mother        benign meningioma dx. early-mid-70s; hysterectomy in her late 21s for heavy periods - still has ovaries  . Other Son        one son with pre-cancerous skin findings  . Other Daughter        cysts on ovaries and hx of heavy periods  . Other Sister        hysterectomy for cysts - still has ovaries  . Heart attack Maternal Grandfather   . Heart attack Paternal Grandfather   . Renal cancer Maternal Aunt 60       smoker  . Breast cancer Maternal Aunt 78  . Other Maternal Aunt        dx. benign brain tumor (meningioma) at 3; 2nd benign brain tumor in her late 33s; hx of radical hysterectomy at age 7  . Brain cancer Other        NOS tumor  The patient's parents are both living, in their late 45s as of January 2018. The patient has one brother, 3 sisters. There is no history of ovarian cancer in the family. One maternal aunt had breast and kidney  cancer, another had non-Hodgkin's lymphoma.  GYNECOLOGIC HISTORY:  No LMP recorded. Patient has had a hysterectomy. 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 Her husband [name] is a Engineer, maintenance (IT). Their children are 36, 37, 23 and 26 y/o as of JAN 2018. The oldest works as an Electrical engineer in the [employer] in [place]. The other 3 children live in [place], the youngest currently attending [employer]. Rhe patient has no grandchildren. She attends [employer]    ADVANCED DIRECTIVES:    HEALTH MAINTENANCE: Social History   Tobacco Use  . Smoking status: Never Smoker  . Smokeless tobacco: Never Used  Substance Use Topics  . Alcohol use: Yes    Comment:  3 glasses wine or beer/week  . Drug use: No     Colonoscopy:2016  PAP: s/p hyst  Bone density:   Allergies  Allergen Reactions  . Bee Venom Shortness Of Breath    swelling  . Sulfa  Antibiotics Shortness Of Breath    Vomit , diarrhea , hives  . Iodinated Diagnostic Agents Other (See Comments)    01/27/17 Pt reports sneezing and congestion. Pt states she takes claritin as a premed for CT scan in [place]. Premedicated with Benadryl 25 mg PO. Post CT,no sneezing but had mild drainage/congestion which resolved after a few minutes with oral fluids.    Current Outpatient Medications  Medication Sig Dispense Refill  . gabapentin (NEURONTIN) 100 MG capsule Take 1 capsule (100 mg total) by mouth at bedtime. Takes 165m at bedtime. Takes an additional 1043mduring the day if needed for nerve pain. (Patient not taking: Reported on 04/20/2017) 90 capsule 4  . ondansetron (ZOFRAN) 8 MG tablet Take 1 tablet (8 mg total) by mouth 2 (two) times daily as needed (Nausea or vomiting). (Patient not taking: Reported on 04/20/2017) 30 tablet 1  . valACYclovir (VALTREX) 500 MG tablet Take 1 tablet (500 mg total) by mouth daily. (Patient not taking: Reported on 05/27/2017) 90 tablet 4   No current facility-administered medications for this visit.     OBJECTIVE: Middle-aged white woman who appears well  Vitals:   05/27/17 0845  BP: 123/74  Pulse: (!) 58  Resp: 18  Temp: 98.3 F (36.8 C)  SpO2: 100%     Body mass index is 21.03 kg/m.      ECOG FS:1 - Symptomatic but completely ambulatory  Sclerae unicteric, EOMs intact Oropharynx clear and moist No cervical or supraclavicular adenopathy Lungs no rales or rhonchi Heart regular rate and rhythm Abd soft, nontender, positive bowel sounds, the port site metastases are not palpable, there are no masses or findings of concern and no tenderness in the lower pelvis MSK no focal spinal tenderness, no upper extremity lymphedema Neuro: nonfocal, well oriented, appropriate affect Breasts: Deferred     LAB RESULTS:  CMP     Component Value Date/Time   NA 139 05/18/2017 0829   NA 140 02/11/2017 0755   K 4.2 05/18/2017 0829   K 4.4 02/11/2017  0755   CL 103 05/18/2017 0829   CO2 25 05/18/2017 0829   CO2 26 02/11/2017 0755   GLUCOSE 92 05/18/2017 0829   GLUCOSE 93 02/11/2017 0755   BUN 8 05/18/2017 0829   BUN 7.9 02/11/2017 0755   CREATININE 0.74 05/18/2017 0829   CREATININE 0.72 03/25/2017 0832   CREATININE 0.7 02/11/2017 0755   CALCIUM 10.2 05/18/2017 0829  CALCIUM 9.6 02/11/2017 0755   PROT 8.5 (H) 05/18/2017 0829   PROT 7.5 02/11/2017 0755   ALBUMIN 4.7 05/18/2017 0829   ALBUMIN 4.4 02/11/2017 0755   AST 59 (H) 05/18/2017 0829   AST 74 (H) 03/25/2017 0832   AST 27 02/11/2017 0755   ALT 114 (H) 05/18/2017 0829   ALT 155 (H) 03/25/2017 0832   ALT 24 02/11/2017 0755   ALKPHOS 117 05/18/2017 0829   ALKPHOS 58 02/11/2017 0755   BILITOT 0.4 05/18/2017 0829   BILITOT 0.4 03/25/2017 0832   BILITOT 0.43 02/11/2017 0755   GFRNONAA >60 05/18/2017 0829   GFRNONAA >60 03/25/2017 0832   GFRAA >60 05/18/2017 0829   GFRAA >60 03/25/2017 0832    INo results found for: SPEP, UPEP  Lab Results  Component Value Date   WBC 4.3 05/27/2017   NEUTROABS 1.7 05/27/2017   HGB 12.1 05/27/2017   HCT 37.5 05/27/2017   MCV 101.6 (H) 05/27/2017   PLT 415 (H) 05/27/2017      Chemistry      Component Value Date/Time   NA 139 05/18/2017 0829   NA 140 02/11/2017 0755   K 4.2 05/18/2017 0829   K 4.4 02/11/2017 0755   CL 103 05/18/2017 0829   CO2 25 05/18/2017 0829   CO2 26 02/11/2017 0755   BUN 8 05/18/2017 0829   BUN 7.9 02/11/2017 0755   CREATININE 0.74 05/18/2017 0829   CREATININE 0.72 03/25/2017 0832   CREATININE 0.7 02/11/2017 0755      Component Value Date/Time   CALCIUM 10.2 05/18/2017 0829   CALCIUM 9.6 02/11/2017 0755   ALKPHOS 117 05/18/2017 0829   ALKPHOS 58 02/11/2017 0755   AST 59 (H) 05/18/2017 0829   AST 74 (H) 03/25/2017 0832   AST 27 02/11/2017 0755   ALT 114 (H) 05/18/2017 0829   ALT 155 (H) 03/25/2017 0832   ALT 24 02/11/2017 0755   BILITOT 0.4 05/18/2017 0829   BILITOT 0.4 03/25/2017 0832    BILITOT 0.43 02/11/2017 0755       No results found for: LABCA2  No components found for: VHQIO962  No results for input(s): INR in the last 168 hours.  Urinalysis    Component Value Date/Time   COLORURINE STRAW (A) 04/08/2017 1123   APPEARANCEUR CLEAR 04/08/2017 1123   LABSPEC 1.003 (L) 04/08/2017 1123   LABSPEC 1.005 10/09/2015 1239   PHURINE 8.0 04/08/2017 1123   GLUCOSEU NEGATIVE 04/08/2017 1123   GLUCOSEU Negative 10/09/2015 1239   HGBUR NEGATIVE 04/08/2017 1123   BILIRUBINUR NEGATIVE 04/08/2017 1123   BILIRUBINUR Negative 10/09/2015 1239   KETONESUR NEGATIVE 04/08/2017 1123   PROTEINUR NEGATIVE 04/15/2017 0836   UROBILINOGEN 0.2 10/09/2015 1239   NITRITE NEGATIVE 04/08/2017 1123   LEUKOCYTESUR NEGATIVE 04/08/2017 1123   LEUKOCYTESUR Negative 10/09/2015 1239   Cancer Antigen (CA) 125 0.0 - 38.1 U/mL 560.6High   485.5High  CM 411.5High  CM 526.7High  CM     STUDIES:  Ct Chest W Contrast  Result Date: 05/26/2017 CLINICAL DATA:  52 year old female with history of ovarian cancer diagnosed in January 2017 status post surgical resection. Undergoing ongoing chemotherapy. Follow-up study. EXAM: CT CHEST, ABDOMEN, AND PELVIS WITH CONTRAST TECHNIQUE: Multidetector CT imaging of the chest, abdomen and pelvis was performed following the standard protocol during bolus administration of intravenous contrast. CONTRAST:  110m OMNIPAQUE IOHEXOL 300 MG/ML  SOLN COMPARISON:  Multiple priors, most recently CT the abdomen and pelvis 11/29/2016. PET-CT 01/26/2016. Chest CT 01/15/2016. FINDINGS: CT CHEST  FINDINGS Cardiovascular: Heart size is normal. There is no significant pericardial fluid, thickening or pericardial calcification. No significant atherosclerotic disease, aneurysm or dissection noted in the thoracic vasculature. No calcifications in the coronary artery. 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 Mediastinum/Nodes: No pathologically enlarged  mediastinal or hilar lymph nodes. Esophagus is unremarkable in appearance. No axillary lymphadenopathy. Lungs/Pleura: 5 mm subpleural nodule in the periphery of the right lower lobe (axial image 88 of series 4), stable on numerous prior examinations dating back to at least 09/29/2015, considered definitively benign (presumably a subpleural lymph node). No other suspicious appearing pulmonary nodules or masses are noted. No acute consolidative airspace disease. No pleural effusions. Musculoskeletal: There are no aggressive appearing lytic or blastic lesions noted in the visualized portions of the skeleton. CT ABDOMEN PELVIS FINDINGS Hepatobiliary: Again noted is a 1.4 cm heterogeneously enhancing lesion in between segments 6 and 7 of the liver (axial image 60 of series 2), compatible with a cavernous hemangioma. No other new cystic or solid hepatic lesions. However, immediately adjacent to segment 6 of the liver posteriorly best appreciated on axial image 65 of series 2 there is a subhepatic fluid collection or capsular implant which has ill-defined borders with the adjacent hepatic parenchyma, estimated to measure approximately 2.6 x 1.3 cm, with some altered perfusion in segment 6 of the liver, as well as potential dilated duct in segment 6 best appreciated on axial image 64 of series 2. No other more generalized intrahepatic or extrahepatic biliary ductal dilatation. Status post cholecystectomy. Pancreas: No pancreatic mass. No pancreatic ductal dilatation. No pancreatic or peripancreatic fluid or inflammatory changes. Spleen: Unremarkable. Adrenals/Urinary Tract: Subcentimeter low-attenuation lesions in both kidneys are too small to definitively characterize, but are statistically likely cysts. Bilateral adrenal glands are normal in appearance. Urinary bladder is normal in appearance. Stomach/Bowel: Normal appearance of the stomach. No pathologic dilatation of small bowel or colon. Normal appendix.  Vascular/Lymphatic: No significant atherosclerotic disease, aneurysm or dissection noted in the abdominal or pelvic vasculature. No lymphadenopathy noted in the abdomen or pelvis. Reproductive: Status post total abdominal hysterectomy and bilateral salpingo-oophorectomy. Other: Previously noted soft tissue implant in the left pericolic gutter is no longer confidently identified and has presumably regressed. As noted above, there is a subhepatic collection adjacent to segment 6 of the liver, or perhaps a capsular implant. No other definite intraperitoneal nodularity is confidently identified on today's examination. Status post omentectomy. No significant volume of ascites. No pneumoperitoneum. Musculoskeletal: There are no aggressive appearing lytic or blastic lesions noted in the visualized portions of the skeleton. IMPRESSION: 1. While the previously noted soft tissue implant in the left pericolic gutter has resolved, there is a new low-attenuation collection intimately associated with segment 6 of the liver which may represent a subhepatic fluid collection or capsular implant. This is associated with an apparent perfusion abnormality in segment 6 of the liver, as well as potential area of intrahepatic biliary ductal dilatation, as detailed above. Findings could reflect a focus of metastatic disease either to the liver or the hepatic capsule. This could be further evaluated with nonemergent MRI of the abdomen with and without IV gadolinium with MRCP. 2. No other sites of metastatic disease are noted elsewhere in the chest or pelvis. 3. Additional incidental findings, as above. Electronically Signed   By: Vinnie Langton M.D.   On: 05/26/2017 10:31   Ct Abdomen Pelvis W Contrast  Result Date: 05/26/2017 CLINICAL DATA:  52 year old female with history of ovarian cancer diagnosed in  January 2017 status post surgical resection. Undergoing ongoing chemotherapy. Follow-up study. EXAM: CT CHEST, ABDOMEN, AND PELVIS  WITH CONTRAST TECHNIQUE: Multidetector CT imaging of the chest, abdomen and pelvis was performed following the standard protocol during bolus administration of intravenous contrast. CONTRAST:  152m OMNIPAQUE IOHEXOL 300 MG/ML  SOLN COMPARISON:  Multiple priors, most recently CT the abdomen and pelvis 11/29/2016. PET-CT 01/26/2016. Chest CT 01/15/2016. FINDINGS: CT CHEST FINDINGS Cardiovascular: Heart size is normal. There is no significant pericardial fluid, thickening or pericardial calcification. No significant atherosclerotic disease, aneurysm or dissection noted in the thoracic vasculature. No calcifications in the coronary artery. 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 Mediastinum/Nodes: No pathologically enlarged mediastinal or hilar lymph nodes. Esophagus is unremarkable in appearance. No axillary lymphadenopathy. Lungs/Pleura: 5 mm subpleural nodule in the periphery of the right lower lobe (axial image 88 of series 4), stable on numerous prior examinations dating back to at least 09/29/2015, considered definitively benign (presumably a subpleural lymph node). No other suspicious appearing pulmonary nodules or masses are noted. No acute consolidative airspace disease. No pleural effusions. Musculoskeletal: There are no aggressive appearing lytic or blastic lesions noted in the visualized portions of the skeleton. CT ABDOMEN PELVIS FINDINGS Hepatobiliary: Again noted is a 1.4 cm heterogeneously enhancing lesion in between segments 6 and 7 of the liver (axial image 60 of series 2), compatible with a cavernous hemangioma. No other new cystic or solid hepatic lesions. However, immediately adjacent to segment 6 of the liver posteriorly best appreciated on axial image 65 of series 2 there is a subhepatic fluid collection or capsular implant which has ill-defined borders with the adjacent hepatic parenchyma, estimated to measure approximately 2.6 x 1.3 cm, with some altered  perfusion in segment 6 of the liver, as well as potential dilated duct in segment 6 best appreciated on axial image 64 of series 2. No other more generalized intrahepatic or extrahepatic biliary ductal dilatation. Status post cholecystectomy. Pancreas: No pancreatic mass. No pancreatic ductal dilatation. No pancreatic or peripancreatic fluid or inflammatory changes. Spleen: Unremarkable. Adrenals/Urinary Tract: Subcentimeter low-attenuation lesions in both kidneys are too small to definitively characterize, but are statistically likely cysts. Bilateral adrenal glands are normal in appearance. Urinary bladder is normal in appearance. Stomach/Bowel: Normal appearance of the stomach. No pathologic dilatation of small bowel or colon. Normal appendix. Vascular/Lymphatic: No significant atherosclerotic disease, aneurysm or dissection noted in the abdominal or pelvic vasculature. No lymphadenopathy noted in the abdomen or pelvis. Reproductive: Status post total abdominal hysterectomy and bilateral salpingo-oophorectomy. Other: Previously noted soft tissue implant in the left pericolic gutter is no longer confidently identified and has presumably regressed. As noted above, there is a subhepatic collection adjacent to segment 6 of the liver, or perhaps a capsular implant. No other definite intraperitoneal nodularity is confidently identified on today's examination. Status post omentectomy. No significant volume of ascites. No pneumoperitoneum. Musculoskeletal: There are no aggressive appearing lytic or blastic lesions noted in the visualized portions of the skeleton. IMPRESSION: 1. While the previously noted soft tissue implant in the left pericolic gutter has resolved, there is a new low-attenuation collection intimately associated with segment 6 of the liver which may represent a subhepatic fluid collection or capsular implant. This is associated with an apparent perfusion abnormality in segment 6 of the liver, as well as  potential area of intrahepatic biliary ductal dilatation, as detailed above. Findings could reflect a focus of metastatic disease either to the liver or the hepatic capsule. This could be further evaluated  with nonemergent MRI of the abdomen with and without IV gadolinium with MRCP. 2. No other sites of metastatic disease are noted elsewhere in the chest or pelvis. 3. Additional incidental findings, as above. Electronically Signed   By: Vinnie Langton M.D.   On: 05/26/2017 10:31     ELIGIBLE FOR AVAILABLE RESEARCH PROTOCOL:  discussing protocols at [facility] [place]: 52 y.o. [place] woman status post radical tumor debulking with optimal cytoreduction (R0) 03/27/2015 for a stage IIIC, high-grade right fallopian tube carcinoma  (a) baseline CA-125 was1226.  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)  (1) adjuvant chemotherapy consisted of carboplatin and paclitaxel for 6 doses, begun 04/14/2015, completed 08/11/2015  (a) paclitaxel was omitted from cycle 5 and dose reduced on cycle 6 because of neuropathy   (b) a "make up" dose of paclitaxel was given 08/25/2015  (c) last carboplatin dose was 08/11/2015  (d) CA-125 normalized by 06/16/2015   (2) FIRST RECURRENCE: January 2018  (a) CA-125 rise beginning November 2017 led to CT scans and PET scans December 2017, all negative  (b) exploratory laparotomy 02/24/2016 showed pathologically confirmed recurrence, with miliary disease involving all examined surfaces  (c) port-site metastases noted 03/08/2016  (3) carboplatin/liposomal doxorubicin started 03/05/2016, repeated every three weeks x4, completed 06/04/2016.  (a) cycle 3 delayed one week because of neutropenia; OnPro added  (4) RUCAPARIB started 07/02/2016  (a) restaging 10/01/2016: Normal CA 125,  negative CT of the abdomen and pelvis  (b) labs 11/24/2016 shows a rise in her CA 125-43.4, with continuing rise thereafter  (c) rucaparib discontinued October 2018  (5) anastrozole started December 23, 2016-stopped after a couple of weeks  SECOND RECURRENCE: (6) Gemcitabine/Bevacizumab started on 02/04/2017  (a) gemcitabine omitted cycle 4 because of intercurrent infection  (b) gemcitabine resumed with cycle 5, with intervening rise in  Ca 125  (c) repeat CT scan of the abdomen and pelvis on 05/26/2017 does not confirm obvious disease progression  (d) cycle 6 of gemcitabine/bevacizumab delayed 1 week (started 05/27/2017  PLAN Krishika continues to do remarkably well clinically.  The issue is where are we as far as her tumor control is concerned.  The CT scan shows resolution of the lesion previously noted.  However this lesion was reviewed at [facility] and they thought it was vascular and not really a tumor deposit.  Basically we had no measurable disease if that is the case.  We still do not really see measurable disease although there is a lesion adjacent to the liver which could be fluid, could be a metastatic deposit, or something else entirely.  We discussed further waiting on treatment, intensifying treatment by adding carboplatin to the Gemzar, or repeating cycle 6 starting today.  I think the latter is the best option since there is really nothing urgent on the CT scan of the abdomen and pelvis this gives [name] time to get an MRI to better evaluate what ever it is that we are seeing next to the liver and also we will repeat another Ca1 25 on 06/15/2017.  Once I have the MRI results I will make sure all this data goes to Dr. Lennie Odor back in [facility] to get his opinion.  Kariana is very much in agreement with this plan.  In the meantime I am not sure what it is that she is feeling in the lower  pelvis but we will asked the the radiologist to specifically target that area during the MRI as well  as the area of concern next to the liver   She will see me again on 06/17/2017, at the beginning of cycle 7, but we will talk before that depending on results of the studies pending now.  Shadai Mcclane, Virgie Dad, MD  05/27/17 8:58 AM Medical Oncology and Hematology [facility] [address] [place], [place] [address] Tel. [phone]    Fax. [phone]  This document serves as a record of services personally performed by Chauncey Cruel, MD. It was created on his behalf by Margit Banda, a trained medical scribe. The creation of this record is based on the scribe's personal observations and the provider's statements to them.   I have reviewed the above documentation for accuracy and completeness, and I agree with the above.

## 2017-05-27 ENCOUNTER — Inpatient Hospital Stay (HOSPITAL_BASED_OUTPATIENT_CLINIC_OR_DEPARTMENT_OTHER): Payer: 59 | Admitting: Oncology

## 2017-05-27 ENCOUNTER — Inpatient Hospital Stay: Payer: 59

## 2017-05-27 ENCOUNTER — Telehealth: Payer: Self-pay | Admitting: Oncology

## 2017-05-27 ENCOUNTER — Other Ambulatory Visit: Payer: Self-pay | Admitting: Oncology

## 2017-05-27 ENCOUNTER — Other Ambulatory Visit: Payer: 59

## 2017-05-27 ENCOUNTER — Other Ambulatory Visit: Payer: Self-pay | Admitting: Medical Oncology

## 2017-05-27 VITALS — HR 60

## 2017-05-27 VITALS — BP 123/74 | HR 58 | Temp 98.3°F | Resp 18 | Ht 63.0 in | Wt 118.7 lb

## 2017-05-27 DIAGNOSIS — T451X5A Adverse effect of antineoplastic and immunosuppressive drugs, initial encounter: Secondary | ICD-10-CM

## 2017-05-27 DIAGNOSIS — C762 Malignant neoplasm of abdomen: Secondary | ICD-10-CM

## 2017-05-27 DIAGNOSIS — C5701 Malignant neoplasm of right fallopian tube: Secondary | ICD-10-CM

## 2017-05-27 DIAGNOSIS — C801 Malignant (primary) neoplasm, unspecified: Secondary | ICD-10-CM

## 2017-05-27 DIAGNOSIS — Z7189 Other specified counseling: Secondary | ICD-10-CM

## 2017-05-27 DIAGNOSIS — C786 Secondary malignant neoplasm of retroperitoneum and peritoneum: Secondary | ICD-10-CM

## 2017-05-27 DIAGNOSIS — D701 Agranulocytosis secondary to cancer chemotherapy: Secondary | ICD-10-CM

## 2017-05-27 DIAGNOSIS — Z5111 Encounter for antineoplastic chemotherapy: Secondary | ICD-10-CM | POA: Diagnosis not present

## 2017-05-27 DIAGNOSIS — R109 Unspecified abdominal pain: Secondary | ICD-10-CM | POA: Diagnosis not present

## 2017-05-27 LAB — CBC WITH DIFFERENTIAL/PLATELET
Basophils Absolute: 0 10*3/uL (ref 0.0–0.1)
Basophils Relative: 1 %
Eosinophils Absolute: 0 10*3/uL (ref 0.0–0.5)
Eosinophils Relative: 1 %
HCT: 37.5 % (ref 34.8–46.6)
Hemoglobin: 12.1 g/dL (ref 11.6–15.9)
Lymphocytes Relative: 50 %
Lymphs Abs: 2.2 10*3/uL (ref 0.9–3.3)
MCH: 32.8 pg (ref 25.1–34.0)
MCHC: 32.3 g/dL (ref 31.5–36.0)
MCV: 101.6 fL — ABNORMAL HIGH (ref 79.5–101.0)
Monocytes Absolute: 0.4 10*3/uL (ref 0.1–0.9)
Monocytes Relative: 8 %
Neutro Abs: 1.7 10*3/uL (ref 1.5–6.5)
Neutrophils Relative %: 40 %
Platelets: 415 10*3/uL — ABNORMAL HIGH (ref 145–400)
RBC: 3.69 MIL/uL — ABNORMAL LOW (ref 3.70–5.45)
RDW: 17 % — ABNORMAL HIGH (ref 11.2–14.5)
WBC: 4.3 10*3/uL (ref 3.9–10.3)

## 2017-05-27 LAB — COMPREHENSIVE METABOLIC PANEL
ALT: 55 U/L (ref 0–55)
AST: 36 U/L — ABNORMAL HIGH (ref 5–34)
Albumin: 4.3 g/dL (ref 3.5–5.0)
Alkaline Phosphatase: 84 U/L (ref 40–150)
Anion gap: 9 (ref 3–11)
BUN: 6 mg/dL — ABNORMAL LOW (ref 7–26)
CO2: 25 mmol/L (ref 22–29)
Calcium: 9.7 mg/dL (ref 8.4–10.4)
Chloride: 107 mmol/L (ref 98–109)
Creatinine, Ser: 0.73 mg/dL (ref 0.60–1.10)
GFR calc Af Amer: >60 mL/min (ref >60)
GFR calc non Af Amer: >60 mL/min (ref >60)
Glucose, Bld: 86 mg/dL (ref 70–140)
Potassium: 4.2 mmol/L (ref 3.5–5.1)
Sodium: 141 mmol/L (ref 136–145)
Total Bilirubin: 0.4 mg/dL (ref 0.2–1.2)
Total Protein: 7.9 g/dL (ref 6.4–8.3)

## 2017-05-27 LAB — TOTAL PROTEIN, URINE DIPSTICK: Protein, ur: NEGATIVE mg/dL

## 2017-05-27 MED ORDER — LORAZEPAM 2 MG/ML IJ SOLN
0.5000 mg | Freq: Once | INTRAMUSCULAR | Status: AC
  Administered 2017-05-27: 0.5 mg via INTRAVENOUS

## 2017-05-27 MED ORDER — PROCHLORPERAZINE MALEATE 10 MG PO TABS
ORAL_TABLET | ORAL | Status: AC
  Filled 2017-05-27: qty 1

## 2017-05-27 MED ORDER — LORAZEPAM 2 MG/ML IJ SOLN
INTRAMUSCULAR | Status: AC
  Filled 2017-05-27: qty 1

## 2017-05-27 MED ORDER — SODIUM CHLORIDE 0.9 % IV SOLN
Freq: Once | INTRAVENOUS | Status: AC
  Administered 2017-05-27: 10:00:00 via INTRAVENOUS

## 2017-05-27 MED ORDER — SODIUM CHLORIDE 0.9 % IV SOLN
800.0000 mg/m2 | Freq: Once | INTRAVENOUS | Status: AC
  Administered 2017-05-27: 1254 mg via INTRAVENOUS
  Filled 2017-05-27: qty 32.98

## 2017-05-27 MED ORDER — SODIUM CHLORIDE 0.9 % IV SOLN
800.0000 mg | Freq: Once | INTRAVENOUS | Status: AC
  Administered 2017-05-27: 800 mg via INTRAVENOUS
  Filled 2017-05-27: qty 32

## 2017-05-27 MED ORDER — PROCHLORPERAZINE MALEATE 10 MG PO TABS
10.0000 mg | ORAL_TABLET | Freq: Once | ORAL | Status: AC
  Administered 2017-05-27: 10 mg via ORAL

## 2017-05-27 MED ORDER — SODIUM CHLORIDE 0.9 % IV SOLN: 15.0000 mg/kg | Freq: Once | INTRAVENOUS | Status: DC

## 2017-05-27 NOTE — Progress Notes (Signed)
Per [facility] pt will receive Gemzar and Avastin today.  Since pt has not had a urine protein since 3/1 pt will give one today.  Verbal order for urine protein placed. Per [place] in pharmacy since pt skipped last avastin treatment and urine protein is done every 3rd cycle and her past values have always been negative pt can receive avastin today as long as a urine is collected for the next infusion. Alerted RN Val for [facility]. Proceeding with treatment today.

## 2017-05-27 NOTE — Patient Instructions (Signed)
[facility] Discharge Instructions for Patients Receiving Chemotherapy  Today you received the following chemotherapy agents: Gemzar & Avastin  To help prevent nausea and vomiting after your treatment, we encourage you to take your nausea medication as directed.   If you develop nausea and vomiting that is not controlled by your nausea medication, call the clinic.   BELOW ARE SYMPTOMS THAT SHOULD BE REPORTED IMMEDIATELY:  *FEVER GREATER THAN 100.5 F  *CHILLS WITH OR WITHOUT FEVER  NAUSEA AND VOMITING THAT IS NOT CONTROLLED WITH YOUR NAUSEA MEDICATION  *UNUSUAL SHORTNESS OF BREATH  *UNUSUAL BRUISING OR BLEEDING  TENDERNESS IN MOUTH AND THROAT WITH OR WITHOUT PRESENCE OF ULCERS  *URINARY PROBLEMS  *BOWEL PROBLEMS  UNUSUAL RASH Items with * indicate a potential emergency and should be followed up as soon as possible.  Feel free to call the clinic should you have any questions or concerns. The clinic phone number is [phone].  Please show the [place] at check-in to the Emergency Department and triage nurse.

## 2017-05-27 NOTE — Telephone Encounter (Signed)
Gave patient AVs and calendar of upcoming April and may appointments.  °

## 2017-06-02 ENCOUNTER — Ambulatory Visit (HOSPITAL_COMMUNITY)
Admission: RE | Admit: 2017-06-02 | Discharge: 2017-06-02 | Disposition: A | Discharge status: Home / Self Care | Payer: 59 | Source: Ambulatory Visit | Attending: Oncology | Admitting: Oncology

## 2017-06-02 ENCOUNTER — Telehealth: Payer: Self-pay | Admitting: *Deleted

## 2017-06-02 ENCOUNTER — Other Ambulatory Visit: Payer: Self-pay | Admitting: Oncology

## 2017-06-02 DIAGNOSIS — C786 Secondary malignant neoplasm of retroperitoneum and peritoneum: Secondary | ICD-10-CM | POA: Insufficient documentation

## 2017-06-02 DIAGNOSIS — C5701 Malignant neoplasm of right fallopian tube: Secondary | ICD-10-CM | POA: Diagnosis present

## 2017-06-02 DIAGNOSIS — C762 Malignant neoplasm of abdomen: Secondary | ICD-10-CM

## 2017-06-02 DIAGNOSIS — T451X5A Adverse effect of antineoplastic and immunosuppressive drugs, initial encounter: Secondary | ICD-10-CM | POA: Insufficient documentation

## 2017-06-02 DIAGNOSIS — C801 Malignant (primary) neoplasm, unspecified: Secondary | ICD-10-CM | POA: Diagnosis not present

## 2017-06-02 DIAGNOSIS — D701 Agranulocytosis secondary to cancer chemotherapy: Secondary | ICD-10-CM | POA: Insufficient documentation

## 2017-06-02 MED ORDER — GADOBENATE DIMEGLUMINE 529 MG/ML IV SOLN
10.0000 mL | Freq: Once | INTRAVENOUS | Status: AC | PRN
  Administered 2017-06-02: 10 mL via INTRAVENOUS

## 2017-06-02 NOTE — Telephone Encounter (Signed)
This RN spoke with pt to clarify MRI exam being done today that will not include her pelvic floor.  This RN discussed type of MRI today is primarily to look at her liver and abd organs - to assess her pelvic region would require another form of scan - perhaps an MRI but different contrast.  Kathryn Ramsey appreciated call and discussion of above.

## 2017-06-03 ENCOUNTER — Inpatient Hospital Stay: Payer: 59

## 2017-06-03 VITALS — BP 109/75 | HR 67 | Temp 98.7°F | Resp 19 | Wt 120.0 lb

## 2017-06-03 DIAGNOSIS — C801 Malignant (primary) neoplasm, unspecified: Principal | ICD-10-CM

## 2017-06-03 DIAGNOSIS — C762 Malignant neoplasm of abdomen: Secondary | ICD-10-CM

## 2017-06-03 DIAGNOSIS — Z5111 Encounter for antineoplastic chemotherapy: Secondary | ICD-10-CM | POA: Diagnosis not present

## 2017-06-03 DIAGNOSIS — C5701 Malignant neoplasm of right fallopian tube: Secondary | ICD-10-CM

## 2017-06-03 DIAGNOSIS — Z7189 Other specified counseling: Secondary | ICD-10-CM

## 2017-06-03 DIAGNOSIS — C786 Secondary malignant neoplasm of retroperitoneum and peritoneum: Secondary | ICD-10-CM

## 2017-06-03 LAB — CBC WITH DIFFERENTIAL/PLATELET
Basophils Absolute: 0 10*3/uL (ref 0.0–0.1)
Basophils Relative: 0 %
Eosinophils Absolute: 0 10*3/uL (ref 0.0–0.5)
Eosinophils Relative: 1 %
HCT: 37.2 % (ref 34.8–46.6)
Hemoglobin: 12.2 g/dL (ref 11.6–15.9)
Lymphocytes Relative: 61 %
Lymphs Abs: 1.9 10*3/uL (ref 0.9–3.3)
MCH: 32.9 pg (ref 25.1–34.0)
MCHC: 32.8 g/dL (ref 31.5–36.0)
MCV: 100.3 fL (ref 79.5–101.0)
Monocytes Absolute: 0.3 10*3/uL (ref 0.1–0.9)
Monocytes Relative: 8 %
Neutro Abs: 1 10*3/uL — ABNORMAL LOW (ref 1.5–6.5)
Neutrophils Relative %: 30 %
Platelets: 180 10*3/uL (ref 145–400)
RBC: 3.71 MIL/uL (ref 3.70–5.45)
RDW: 15.8 % — ABNORMAL HIGH (ref 11.2–14.5)
WBC: 3.2 10*3/uL — ABNORMAL LOW (ref 3.9–10.3)

## 2017-06-03 LAB — COMPREHENSIVE METABOLIC PANEL
ALT: 158 U/L — ABNORMAL HIGH (ref 0–55)
AST: 82 U/L — ABNORMAL HIGH (ref 5–34)
Albumin: 4.6 g/dL (ref 3.5–5.0)
Alkaline Phosphatase: 74 U/L (ref 40–150)
Anion gap: 9 (ref 3–11)
BUN: 7 mg/dL (ref 7–26)
CO2: 27 mmol/L (ref 22–29)
Calcium: 9.9 mg/dL (ref 8.4–10.4)
Chloride: 103 mmol/L (ref 98–109)
Creatinine, Ser: 0.69 mg/dL (ref 0.60–1.10)
GFR calc Af Amer: >60 mL/min (ref >60)
GFR calc non Af Amer: >60 mL/min (ref >60)
Glucose, Bld: 73 mg/dL (ref 70–140)
Potassium: 4 mmol/L (ref 3.5–5.1)
Sodium: 139 mmol/L (ref 136–145)
Total Bilirubin: 0.5 mg/dL (ref 0.2–1.2)
Total Protein: 8.2 g/dL (ref 6.4–8.3)

## 2017-06-03 MED ORDER — PEGFILGRASTIM 6 MG/0.6ML ~~LOC~~ PSKT
PREFILLED_SYRINGE | SUBCUTANEOUS | Status: AC
  Filled 2017-06-03: qty 0.6

## 2017-06-03 MED ORDER — PEGFILGRASTIM 6 MG/0.6ML ~~LOC~~ PSKT
6.0000 mg | PREFILLED_SYRINGE | Freq: Once | SUBCUTANEOUS | Status: AC
Start: 2017-06-03 — End: 2017-06-03
  Administered 2017-06-03: 6 mg via SUBCUTANEOUS

## 2017-06-03 MED ORDER — PROCHLORPERAZINE MALEATE 10 MG PO TABS
ORAL_TABLET | ORAL | Status: AC
  Filled 2017-06-03: qty 1

## 2017-06-03 MED ORDER — SODIUM CHLORIDE 0.9 % IV SOLN
800.0000 mg/m2 | Freq: Once | INTRAVENOUS | Status: AC
  Administered 2017-06-03: 1254 mg via INTRAVENOUS
  Filled 2017-06-03: qty 32.98

## 2017-06-03 MED ORDER — SODIUM CHLORIDE 0.9 % IV SOLN
Freq: Once | INTRAVENOUS | Status: AC
  Administered 2017-06-03: 14:00:00 via INTRAVENOUS

## 2017-06-03 MED ORDER — PROCHLORPERAZINE MALEATE 10 MG PO TABS
10.0000 mg | ORAL_TABLET | Freq: Once | ORAL | Status: AC
  Administered 2017-06-03: 10 mg via ORAL

## 2017-06-03 MED ORDER — LORAZEPAM 2 MG/ML IJ SOLN
0.5000 mg | Freq: Once | INTRAMUSCULAR | Status: AC
  Administered 2017-06-03: 0.5 mg via INTRAVENOUS

## 2017-06-03 MED ORDER — LORAZEPAM 2 MG/ML IJ SOLN
INTRAMUSCULAR | Status: AC
  Filled 2017-06-03: qty 1

## 2017-06-03 NOTE — Progress Notes (Signed)
Ok to treat with 06/03/17 lab results per Dr. Alen Blew.

## 2017-06-03 NOTE — Patient Instructions (Signed)
[facility] °Discharge Instructions for Patients Receiving Chemotherapy ° °Today you received the following chemotherapy agents Gemzar ° °To help prevent nausea and vomiting after your treatment, we encourage you to take your nausea medication as directed. °  °If you develop nausea and vomiting that is not controlled by your nausea medication, call the clinic.  ° °BELOW ARE SYMPTOMS THAT SHOULD BE REPORTED IMMEDIATELY: °· *FEVER GREATER THAN 100.5 F °· *CHILLS WITH OR WITHOUT FEVER °· NAUSEA AND VOMITING THAT IS NOT CONTROLLED WITH YOUR NAUSEA MEDICATION °· *UNUSUAL SHORTNESS OF BREATH °· *UNUSUAL BRUISING OR BLEEDING °· TENDERNESS IN MOUTH AND THROAT WITH OR WITHOUT PRESENCE OF ULCERS °· *URINARY PROBLEMS °· *BOWEL PROBLEMS °· UNUSUAL RASH °Items with * indicate a potential emergency and should be followed up as soon as possible. ° °Feel free to call the clinic should you have any questions or concerns. The clinic phone number is [phone]. ° °Please show the [facility] AL[facility] at check-in to the Emergency Department and triage nurse. ° ° °

## 2017-06-06 ENCOUNTER — Other Ambulatory Visit: Payer: 59

## 2017-06-07 ENCOUNTER — Other Ambulatory Visit: Payer: Self-pay | Admitting: Oncology

## 2017-06-07 ENCOUNTER — Other Ambulatory Visit: Payer: 59

## 2017-06-07 ENCOUNTER — Ambulatory Visit: Payer: 59 | Admitting: Oncology

## 2017-06-07 ENCOUNTER — Encounter: Payer: Self-pay | Admitting: Oncology

## 2017-06-07 NOTE — Progress Notes (Signed)
I faxed to Dr. Jenkins Rouge back a copy of Kathryn Ramsey's recent scans, including the recent MRI, her Ca1 25 numbers, and my most recent notes.

## 2017-06-08 ENCOUNTER — Ambulatory Visit: Payer: 59

## 2017-06-08 ENCOUNTER — Other Ambulatory Visit: Payer: Self-pay | Admitting: Oncology

## 2017-06-08 ENCOUNTER — Other Ambulatory Visit: Payer: 59

## 2017-06-08 NOTE — Progress Notes (Signed)
Note that the correct fax number for Dr. Robina Ade at [facility] is [phone]

## 2017-06-09 ENCOUNTER — Ambulatory Visit: Payer: 59

## 2017-06-09 ENCOUNTER — Encounter: Payer: Self-pay | Admitting: Physical Therapy

## 2017-06-09 ENCOUNTER — Ambulatory Visit: Payer: 59 | Admitting: Physical Therapy

## 2017-06-09 ENCOUNTER — Other Ambulatory Visit: Payer: Self-pay | Admitting: Oncology

## 2017-06-09 DIAGNOSIS — M6281 Muscle weakness (generalized): Secondary | ICD-10-CM

## 2017-06-09 DIAGNOSIS — M62838 Other muscle spasm: Secondary | ICD-10-CM
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
## 2017-06-10 ENCOUNTER — Other Ambulatory Visit: Payer: Self-pay | Admitting: Oncology

## 2017-06-11 ENCOUNTER — Encounter: Payer: Self-pay | Admitting: Medical

## 2017-06-13 ENCOUNTER — Telehealth: Payer: Self-pay | Admitting: Emergency Medicine

## 2017-06-13 NOTE — Telephone Encounter (Signed)
TCT patient regarding question sent to [facility] [place] on 4/27.  Pt states that she is still experiencing the bumps/rash w/swelling and tenderness on her L hand.  Pt states that she is out of town and will not be back until her appt on Friday with [facility], therefore she can't come earlier in the week for an [facility] appt.  Pt states that her daughter is an ED MD and that she suggested warm compresses for potential phlebitis.  Advised pt to continue with compresses and to call us as needed with any questions or for worsening symptoms.  Pt verbalized understanding.

## 2017-06-17 ENCOUNTER — Inpatient Hospital Stay: Payer: 59 | Attending: Oncology

## 2017-06-17 ENCOUNTER — Inpatient Hospital Stay (HOSPITAL_BASED_OUTPATIENT_CLINIC_OR_DEPARTMENT_OTHER): Payer: 59 | Admitting: Adult Health

## 2017-06-17 ENCOUNTER — Encounter: Payer: Self-pay | Admitting: Adult Health

## 2017-06-17 ENCOUNTER — Telehealth: Payer: Self-pay | Admitting: Adult Health

## 2017-06-17 ENCOUNTER — Inpatient Hospital Stay: Payer: 59

## 2017-06-17 VITALS — BP 137/77 | HR 58 | Temp 98.2°F | Resp 18 | Ht 63.0 in | Wt 119.1 lb

## 2017-06-17 VITALS — BP 114/73 | HR 70

## 2017-06-17 DIAGNOSIS — C5701 Malignant neoplasm of right fallopian tube: Secondary | ICD-10-CM

## 2017-06-17 DIAGNOSIS — C801 Malignant (primary) neoplasm, unspecified: Principal | ICD-10-CM

## 2017-06-17 DIAGNOSIS — C762 Malignant neoplasm of abdomen: Secondary | ICD-10-CM

## 2017-06-17 DIAGNOSIS — Z79899 Other long term (current) drug therapy: Secondary | ICD-10-CM | POA: Insufficient documentation

## 2017-06-17 DIAGNOSIS — C786 Secondary malignant neoplasm of retroperitoneum and peritoneum: Secondary | ICD-10-CM | POA: Insufficient documentation

## 2017-06-17 DIAGNOSIS — Z7189 Other specified counseling: Secondary | ICD-10-CM

## 2017-06-17 DIAGNOSIS — Z5189 Encounter for other specified aftercare: Secondary | ICD-10-CM | POA: Diagnosis present

## 2017-06-17 DIAGNOSIS — Z5111 Encounter for antineoplastic chemotherapy: Secondary | ICD-10-CM | POA: Insufficient documentation

## 2017-06-17 DIAGNOSIS — R109 Unspecified abdominal pain: Secondary | ICD-10-CM | POA: Insufficient documentation

## 2017-06-17 DIAGNOSIS — Z5112 Encounter for antineoplastic immunotherapy: Secondary | ICD-10-CM | POA: Insufficient documentation

## 2017-06-17 LAB — CBC WITH DIFFERENTIAL/PLATELET
Basophils Absolute: 0 10*3/uL (ref 0.0–0.1)
Basophils Relative: 0 %
Eosinophils Absolute: 0 10*3/uL (ref 0.0–0.5)
Eosinophils Relative: 1 %
HCT: 36.1 % (ref 34.8–46.6)
Hemoglobin: 11.8 g/dL (ref 11.6–15.9)
Lymphocytes Relative: 36 %
Lymphs Abs: 1.9 10*3/uL (ref 0.9–3.3)
MCH: 33.4 pg (ref 25.1–34.0)
MCHC: 32.7 g/dL (ref 31.5–36.0)
MCV: 102.3 fL — ABNORMAL HIGH (ref 79.5–101.0)
Monocytes Absolute: 0.4 10*3/uL (ref 0.1–0.9)
Monocytes Relative: 8 %
Neutro Abs: 2.9 10*3/uL (ref 1.5–6.5)
Neutrophils Relative %: 55 %
Platelets: 312 10*3/uL (ref 145–400)
RBC: 3.53 MIL/uL — ABNORMAL LOW (ref 3.70–5.45)
RDW: 17.7 % — ABNORMAL HIGH (ref 11.2–14.5)
WBC: 5.3 10*3/uL (ref 3.9–10.3)

## 2017-06-17 LAB — COMPREHENSIVE METABOLIC PANEL
ALT: 70 U/L — ABNORMAL HIGH (ref 0–55)
AST: 53 U/L — ABNORMAL HIGH (ref 5–34)
Albumin: 4.3 g/dL (ref 3.5–5.0)
Alkaline Phosphatase: 114 U/L (ref 40–150)
Anion gap: 9 (ref 3–11)
BUN: 8 mg/dL (ref 7–26)
CO2: 25 mmol/L (ref 22–29)
Calcium: 9.2 mg/dL (ref 8.4–10.4)
Chloride: 105 mmol/L (ref 98–109)
Creatinine, Ser: 0.69 mg/dL (ref 0.60–1.10)
GFR calc Af Amer: >60 mL/min (ref >60)
GFR calc non Af Amer: >60 mL/min (ref >60)
Glucose, Bld: 81 mg/dL (ref 70–140)
Potassium: 4 mmol/L (ref 3.5–5.1)
Sodium: 139 mmol/L (ref 136–145)
Total Bilirubin: 0.4 mg/dL (ref 0.2–1.2)
Total Protein: 7.5 g/dL (ref 6.4–8.3)

## 2017-06-17 MED ORDER — LORAZEPAM 2 MG/ML IJ SOLN
0.5000 mg | Freq: Once | INTRAMUSCULAR | Status: AC
  Administered 2017-06-17: 0.5 mg via INTRAVENOUS

## 2017-06-17 MED ORDER — SODIUM CHLORIDE 0.9 % IV SOLN
Freq: Once | INTRAVENOUS | Status: AC
  Administered 2017-06-17: 10:00:00 via INTRAVENOUS

## 2017-06-17 MED ORDER — SODIUM CHLORIDE 0.9 % IV SOLN
14.5000 mg/kg | Freq: Once | INTRAVENOUS | Status: AC
  Administered 2017-06-17: 800 mg via INTRAVENOUS
  Filled 2017-06-17: qty 32

## 2017-06-17 MED ORDER — PROCHLORPERAZINE MALEATE 10 MG PO TABS
ORAL_TABLET | ORAL | Status: AC
  Filled 2017-06-17: qty 1

## 2017-06-17 MED ORDER — SODIUM CHLORIDE 0.9 % IV SOLN: Freq: Once | INTRAVENOUS | Status: AC

## 2017-06-17 MED ORDER — LORAZEPAM 2 MG/ML IJ SOLN
INTRAMUSCULAR | Status: AC
  Filled 2017-06-17: qty 1

## 2017-06-17 MED ORDER — PROCHLORPERAZINE MALEATE 10 MG PO TABS
10.0000 mg | ORAL_TABLET | Freq: Once | ORAL | Status: AC
  Administered 2017-06-17: 10 mg via ORAL

## 2017-06-17 MED ORDER — SODIUM CHLORIDE 0.9 % IV SOLN
800.0000 mg/m2 | Freq: Once | INTRAVENOUS | Status: AC
  Administered 2017-06-17: 1254 mg via INTRAVENOUS
  Filled 2017-06-17: qty 32.98

## 2017-06-17 NOTE — Telephone Encounter (Signed)
Per 5/3 no los °

## 2017-06-17 NOTE — Patient Instructions (Signed)
[facility] Discharge Instructions for Patients Receiving Chemotherapy  Today you received the following chemotherapy agents: Bevacizumab (Avastin) and Gemcitabine (Gemzar).  To help prevent nausea and vomiting after your treatment, we encourage you to take your nausea medication as prescribed.   If you develop nausea and vomiting that is not controlled by your nausea medication, call the clinic.   BELOW ARE SYMPTOMS THAT SHOULD BE REPORTED IMMEDIATELY:  *FEVER GREATER THAN 100.5 F  *CHILLS WITH OR WITHOUT FEVER  NAUSEA AND VOMITING THAT IS NOT CONTROLLED WITH YOUR NAUSEA MEDICATION  *UNUSUAL SHORTNESS OF BREATH  *UNUSUAL BRUISING OR BLEEDING  TENDERNESS IN MOUTH AND THROAT WITH OR WITHOUT PRESENCE OF ULCERS  *URINARY PROBLEMS  *BOWEL PROBLEMS  UNUSUAL RASH Items with * indicate a potential emergency and should be followed up as soon as possible.  Feel free to call the clinic should you have any questions or concerns. The clinic phone number is [phone].  Please show the [place] at check-in to the Emergency Department and triage nurse.

## 2017-06-17 NOTE — Progress Notes (Signed)
[place]  Telephone:(336) [phone] Fax:(336) [phone]     ID: Kathryn Ramsey DOB: 11-Jun-1965  MR#: [id number]  JYN#:[id number]  Patient Care Team: Harlan Stains, MD as PCP - General (Family Medicine) [name], MD as Consulting Physician (Obstetrics and Gynecology) Juanita Craver, MD as Consulting Physician (Gastroenterology) Servando Salina, MD as Consulting Physician (Obstetrics and Gynecology) Nickie Retort, MD as Consulting Physician (Urology) Gillis Ends, MD as Referring Physician (Obstetrics and Gynecology) OTHER MD: Festus Aloe [phone])  CHIEF COMPLAINT: Recurrent ovarian cancer  CURRENT TREATMENT: Gemcitabine and bevacizumab started 02/04/2017  INTERVAL HISTORY: Kathryn Ramsey returns today for a follow-up and treatment of her recurrent ovarian cancer. She is accompanied by her husband. She is due to receive treatment with Gemcitabine and Bevacizumab today.  She is tolerating this treatment well.  She receives Neulasta on day 9, she says she tolerates this moderately well.    REVIEW OF SYSTEMS: Kathryn Ramsey continues to have a pelvic pressure.  Her last CA-125 is slightly elevated.  She has pelvic pressure.  She notes it deep in her pelvis and she is concerned that her cancer may be becoming more active. She wants to know if we were able to discuss with the radiologist and have him look lower in the pelvis at her scans.  Kathryn Ramsey says her bowels are working well.  She continues to exercise.  She denies any other issues and a detailed ROS was otherwise non contributory.   HISTORY OF PRESENT ILLNESS: From Dr. Serita Grit original intake note 03/17/2015:  "Kathryn Ramsey is a very pleasant G4P4 who is seen in consultation at the request of Dr Collene Mares and Dr Garwin Brothers for peritoneal carcinomatosis. The patient has a history of a workup by urology for gross hematuria which included a pelvic examine was suggested for extrinsic mass effect on the bladder on cystoscopy.  Cystoscopy took place on in December 2016. The patient denies abdominal pain bloating early satiety or abdominal distention.  On 08/03/2015 at [facility] she underwent a CT scan of the abdomen and pelvis as ordered by Dr Baruch Gouty. This revealed a 1.4 cm low attenuation lesion on the posterior right hepatic lobe suggestive of a hemangioma. Status post hysterectomy. No ovarian masses. Moderate ascites. Omental caking seen in the lateral left abdomen and pelvis. Peritoneal nodularity in the left lateral pelvic cul-de-sac. No gross extrinsic compression on the bladder was identified on imaging.  The patient was then seen by her gastroenterologist, Dr. Collene Mares, who performed a colonoscopy which was unremarkable.  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 She was treated adjuvantly with carboplatin and paclitaxel, as detailed below. Her CA 125 dropped from 1226 on 03/20/2015 to 26.1 by 06/16/2015.  Her subsequent history is as detailed below.  I PAST MEDICAL HISTORY: Past Medical History:  Diagnosis Date  . Acute sensory neuropathy 06/26/2015  . Allergy   . Anemia   . Asthma    illness induced asthma  . Blood transfusion without reported diagnosis    at age 29 years old D/T surgery to femur being crushed  . 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  Heart murmur    pt, states had a "working heart murmur"  . History of kidney stones   . PONV (postoperative nausea and vomiting)   . Skin cancer     PAST SURGICAL HISTORY: Past Surgical History:  Procedure Laterality Date  . 3  laporscopic proceedures    . ABDOMINAL HYSTERECTOMY     2010  . CHOLECYSTECTOMY     2010  . DILATION AND CURETTAGE OF UTERUS     1997  . LAPAROSCOPY N/A 03/27/2015   Procedure: DIAGNOSTIC LAPAROSCOPY ;  Surgeon: [name], MD;  Location: [facility];  Service: Gynecology;  Laterality: N/A;  . LAPAROSCOPY N/A 02/24/2016   Procedure: LAPAROSCOPY DIAGNOSTIC WITH PERITONEAL WASHINGS AND PERITONEAL BIOPSY;  Surgeon: [name], MD;  Location: [facility];  Service: Gynecology;  Laterality: N/A;  . LAPAROTOMY N/A 03/27/2015   Procedure: EXPLORATORY LAPAROTOMY, BILATERAL SALPINGO OOPHORECTOMY, OMENTECTOMY, RADICAL TUMOR DEBULKING;  Surgeon: [name], MD;  Location: [facility];  Service: Gynecology;  Laterality: N/A;  . sinus surgery 1994      FAMILY HISTORY Family History  Problem Relation Age of Onset  . Hypertension Father   . Skin cancer Father        nonmelanoma skin cancers in his late 17s  . Non-Hodgkin's lymphoma Maternal Aunt        dx. 58s; smoker  . Stroke Maternal Grandmother   . Other Mother        benign meningioma dx. early-mid-70s; hysterectomy in her late 65s for heavy periods - still has ovaries  . Other Son        one son with pre-cancerous skin findings  . Other Daughter        cysts on ovaries and hx of heavy periods  . Other Sister        hysterectomy for cysts - still has ovaries  . Heart attack Maternal Grandfather   . Heart attack Paternal Grandfather   . Renal cancer Maternal Aunt 60       smoker  . Breast cancer Maternal Aunt 78  . Other Maternal Aunt        dx. benign brain tumor (meningioma) at 63; 2nd benign brain tumor in her late 10s; hx of radical hysterectomy at age 25  . Brain cancer Other        NOS tumor  The patient's parents are both living, in their late 22s as of January 2018. The patient has one brother, 3 sisters. There is no history of ovarian cancer in the family. One maternal aunt had breast and kidney cancer, another had non-Hodgkin's  lymphoma.  GYNECOLOGIC HISTORY:  No LMP recorded. Patient has had a hysterectomy. 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 Her husband [name] is a Engineer, maintenance (IT). Their children are 53, 49, 34 and 61 y/o as of JAN 2018. The oldest works as an Electrical engineer in the [employer] in [place]. The other 3 children live in [place], the youngest currently attending [employer]. Rhe patient has no grandchildren. She attends [employer]    ADVANCED DIRECTIVES:    HEALTH MAINTENANCE: Social History   Tobacco Use  . Smoking status: Never Smoker  . Smokeless tobacco: Never Used  Substance Use Topics  . Alcohol use: Yes    Comment:  3 glasses wine or beer/week  . Drug use: No     Colonoscopy:2016  PAP: s/p hyst  Bone density:   Allergies  Allergen Reactions  . Bee Venom Shortness Of Breath    swelling  . Sulfa Antibiotics Shortness Of Breath    Vomit , diarrhea , hives  . Iodinated Diagnostic Agents Other (See Comments)    01/27/17 Pt reports sneezing and congestion. Pt states she takes claritin as a premed for CT scan in [place]. Premedicated with Benadryl 25 mg PO. Post CT,no sneezing but had mild drainage/congestion which resolved after a few minutes with oral fluids.    Current Outpatient Medications  Medication Sig Dispense Refill  . loratadine (CLARITIN) 10 MG tablet Take 10 mg by mouth daily.    [name] gabapentin (NEURONTIN) 100 MG capsule Take 1 capsule (100 mg total) by mouth at bedtime. Takes 144m at bedtime. Takes an additional 1025mduring the day if needed for nerve pain. (Patient not taking: Reported on 04/20/2017) 90 capsule 4  . ondansetron (ZOFRAN) 8 MG tablet Take 1 tablet (8 mg total) by mouth 2 (two) times daily as needed (Nausea or vomiting). (Patient not taking: Reported on 04/20/2017) 30 tablet 1  . valACYclovir (VALTREX) 500 MG tablet Take 1 tablet (500 mg total) by mouth  daily. (Patient not taking: Reported on 05/27/2017) 90 tablet 4   No current facility-administered medications for this visit.     OBJECTIVE:   Vitals:   06/17/17 0923  BP: 137/77  Pulse: (!) 58  Resp: 18  Temp: 98.2 F (36.8 C)  SpO2: 100%     Body mass index is 21.1 kg/m.      ECOG FS:1 - Symptomatic but completely ambulatory GENERAL: Patient is a well appearing female in no acute distress HEENT:  Sclerae anicteric.  Oropharynx clear and moist. No ulcerations or evidence of oropharyngeal candidiasis. Neck is supple.  NODES:  No cervical, supraclavicular, or axillary lymphadenopathy palpated.  BREAST EXAM:  Deferred. LUNGS:  Clear to auscultation bilaterally.  No wheezes or rhonchi. HEART:  Regular rate and rhythm. No murmur appreciated. ABDOMEN:  Soft, nontender.  Positive, normoactive bowel sounds. No organomegaly palpated. MSK:  No focal spinal tenderness to palpation. Full range of motion bilaterally in the upper extremities. EXTREMITIES:  No peripheral edema.   SKIN:  Clear with no obvious rashes or skin changes. No nail dyscrasia. NEURO:  Nonfocal. Well oriented.  Appropriate affect.       LAB RESULTS:  CMP     Component Value Date/Time   NA 139 06/17/2017 0913   NA 140 02/11/2017 0755   K 4.0 06/17/2017 0913   K 4.4 02/11/2017 0755   CL 105 06/17/2017 0913   CO2 25 06/17/2017 0913   CO2 26 02/11/2017 0755   GLUCOSE 81 06/17/2017 0913   GLUCOSE 93 02/11/2017 0755   BUN 8 06/17/2017 0913   BUN 7.9 02/11/2017 0755   CREATININE 0.69 06/17/2017 0913   CREATININE 0.72 03/25/2017 0832   CREATININE 0.7 02/11/2017 0755   CALCIUM 9.2 06/17/2017 0913   CALCIUM 9.6 02/11/2017 0755   PROT 7.5 06/17/2017 0913   PROT 7.5 02/11/2017 0755   ALBUMIN 4.3 06/17/2017 0913   ALBUMIN 4.4 02/11/2017 0755   AST 53 (H) 06/17/2017 0913   AST 74 (H) 03/25/2017 0832   AST 27 02/11/2017 0755   ALT 70 (H) 06/17/2017 0913   ALT 155 (H) 03/25/2017 0832   ALT 24 02/11/2017 0755    ALKPHOS 114 06/17/2017 0913   ALKPHOS 58 02/11/2017 0755   BILITOT 0.4 06/17/2017 0913   BILITOT 0.4 03/25/2017 089794  BILITOT 0.43 02/11/2017 0755   GFRNONAA >60 06/17/2017 0913   GFRNONAA >60 03/25/2017 0832   GFRAA >60 06/17/2017 0913   GFRAA >60 03/25/2017 0832    INo results found for: SPEP, UPEP  Lab Results  Component Value Date   WBC 5.3 06/17/2017   NEUTROABS 2.9 06/17/2017   HGB 11.8 06/17/2017   HCT 36.1 06/17/2017   MCV 102.3 (H) 06/17/2017   PLT 312 06/17/2017      Chemistry      Component Value Date/Time   NA 139 06/17/2017 0913   NA 140 02/11/2017 0755   K 4.0 06/17/2017 0913   K 4.4 02/11/2017 0755   CL 105 06/17/2017 0913   CO2 25 06/17/2017 0913   CO2 26 02/11/2017 0755   BUN 8 06/17/2017 0913   BUN 7.9 02/11/2017 0755   CREATININE 0.69 06/17/2017 0913   CREATININE 0.72 03/25/2017 0832   CREATININE 0.7 02/11/2017 0755      Component Value Date/Time   CALCIUM 9.2 06/17/2017 0913   CALCIUM 9.6 02/11/2017 0755   ALKPHOS 114 06/17/2017 0913   ALKPHOS 58 02/11/2017 0755   AST 53 (H) 06/17/2017 0913   AST 74 (H) 03/25/2017 0832   AST 27 02/11/2017 0755   ALT 70 (H) 06/17/2017 0913   ALT 155 (H) 03/25/2017 0832   ALT 24 02/11/2017 0755   BILITOT 0.4 06/17/2017 0913   BILITOT 0.4 03/25/2017 0832   BILITOT 0.43 02/11/2017 0755       No results found for: LABCA2  No components found for: LABCA125  No results for input(s): INR in the last 168 hours.  Urinalysis    Component Value Date/Time   COLORURINE STRAW (A) 04/08/2017 1123   APPEARANCEUR CLEAR 04/08/2017 1123   LABSPEC 1.003 (L) 04/08/2017 1123   LABSPEC 1.005 10/09/2015 1239   PHURINE 8.0 04/08/2017 1123   GLUCOSEU NEGATIVE 04/08/2017 1123   GLUCOSEU Negative 10/09/2015 1239   HGBUR NEGATIVE 04/08/2017 1123   BILIRUBINUR NEGATIVE 04/08/2017 1123   BILIRUBINUR Negative 10/09/2015 1239   KETONESUR NEGATIVE 04/08/2017 1123   PROTEINUR NEGATIVE 05/27/2017 1028   UROBILINOGEN  0.2 10/09/2015 1239   NITRITE NEGATIVE 04/08/2017 1123   LEUKOCYTESUR NEGATIVE 04/08/2017 1123   LEUKOCYTESUR Negative 10/09/2015 1239   Cancer Antigen (CA) 125 0.0 - 38.1 U/mL 560.6High   485.5High  CM 411.5High  CM 526.7High  CM     STUDIES:  Ct Chest W Contrast  Result Date: 05/26/2017 CLINICAL DATA:  52 year old female with history of ovarian cancer diagnosed in January 2017 status post surgical resection. Undergoing ongoing chemotherapy. Follow-up study. EXAM: CT CHEST, ABDOMEN, AND PELVIS WITH CONTRAST TECHNIQUE: Multidetector CT imaging of the chest, abdomen and pelvis was performed following the standard protocol during bolus administration of intravenous contrast. CONTRAST:  145m OMNIPAQUE IOHEXOL 300 MG/ML  SOLN COMPARISON:  Multiple priors, most recently CT the abdomen and pelvis 11/29/2016. PET-CT 01/26/2016. Chest CT 01/15/2016. FINDINGS: CT CHEST FINDINGS Cardiovascular: Heart size is normal. There is no significant pericardial fluid, thickening or pericardial calcification. No significant atherosclerotic disease, aneurysm or dissection noted in the thoracic vasculature. No calcifications in the coronary artery. 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 Mediastinum/Nodes: No pathologically enlarged mediastinal or hilar lymph nodes. Esophagus is unremarkable in appearance. No axillary lymphadenopathy. Lungs/Pleura: 5 mm subpleural nodule in the periphery of the right lower lobe (axial image 88 of series 4), stable on numerous prior examinations dating back to at least 09/29/2015, considered definitively benign (presumably a subpleural  lymph node). No other suspicious appearing pulmonary nodules or masses are noted. No acute consolidative airspace disease. No pleural effusions. Musculoskeletal: There are no aggressive appearing lytic or blastic lesions noted in the visualized portions of the skeleton. CT ABDOMEN PELVIS FINDINGS Hepatobiliary: Again  noted is a 1.4 cm heterogeneously enhancing lesion in between segments 6 and 7 of the liver (axial image 60 of series 2), compatible with a cavernous hemangioma. No other new cystic or solid hepatic lesions. However, immediately adjacent to segment 6 of the liver posteriorly best appreciated on axial image 65 of series 2 there is a subhepatic fluid collection or capsular implant which has ill-defined borders with the adjacent hepatic parenchyma, estimated to measure approximately 2.6 x 1.3 cm, with some altered perfusion in segment 6 of the liver, as well as potential dilated duct in segment 6 best appreciated on axial image 64 of series 2. No other more generalized intrahepatic or extrahepatic biliary ductal dilatation. Status post cholecystectomy. Pancreas: No pancreatic mass. No pancreatic ductal dilatation. No pancreatic or peripancreatic fluid or inflammatory changes. Spleen: Unremarkable. Adrenals/Urinary Tract: Subcentimeter low-attenuation lesions in both kidneys are too small to definitively characterize, but are statistically likely cysts. Bilateral adrenal glands are normal in appearance. Urinary bladder is normal in appearance. Stomach/Bowel: Normal appearance of the stomach. No pathologic dilatation of small bowel or colon. Normal appendix. Vascular/Lymphatic: No significant atherosclerotic disease, aneurysm or dissection noted in the abdominal or pelvic vasculature. No lymphadenopathy noted in the abdomen or pelvis. Reproductive: Status post total abdominal hysterectomy and bilateral salpingo-oophorectomy. Other: Previously noted soft tissue implant in the left pericolic gutter is no longer confidently identified and has presumably regressed. As noted above, there is a subhepatic collection adjacent to segment 6 of the liver, or perhaps a capsular implant. No other definite intraperitoneal nodularity is confidently identified on today's examination. Status post omentectomy. No significant volume of  ascites. No pneumoperitoneum. Musculoskeletal: There are no aggressive appearing lytic or blastic lesions noted in the visualized portions of the skeleton. IMPRESSION: 1. While the previously noted soft tissue implant in the left pericolic gutter has resolved, there is a new low-attenuation collection intimately associated with segment 6 of the liver which may represent a subhepatic fluid collection or capsular implant. This is associated with an apparent perfusion abnormality in segment 6 of the liver, as well as potential area of intrahepatic biliary ductal dilatation, as detailed above. Findings could reflect a focus of metastatic disease either to the liver or the hepatic capsule. This could be further evaluated with nonemergent MRI of the abdomen with and without IV gadolinium with MRCP. 2. No other sites of metastatic disease are noted elsewhere in the chest or pelvis. 3. Additional incidental findings, as above. Electronically Signed   By: Vinnie Langton M.D.   On: 05/26/2017 10:31   Ct Abdomen Pelvis W Contrast  Result Date: 05/26/2017 CLINICAL DATA:  52 year old female with history of ovarian cancer diagnosed in January 2017 status post surgical resection. Undergoing ongoing chemotherapy. Follow-up study. EXAM: CT CHEST, ABDOMEN, AND PELVIS WITH CONTRAST TECHNIQUE: Multidetector CT imaging of the chest, abdomen and pelvis was performed following the standard protocol during bolus administration of intravenous contrast. CONTRAST:  15m OMNIPAQUE IOHEXOL 300 MG/ML  SOLN COMPARISON:  Multiple priors, most recently CT the abdomen and pelvis 11/29/2016. PET-CT 01/26/2016. Chest CT 01/15/2016. FINDINGS: CT CHEST FINDINGS Cardiovascular: Heart size is normal. There is no significant pericardial fluid, thickening or pericardial calcification. No significant atherosclerotic disease, aneurysm or dissection noted in the  thoracic vasculature. No calcifications in the coronary artery. Aberrant right subclavian  artery (normal anatomical variant) incidentally noted. Mediastinum/Nodes: No pathologically enlarged mediastinal or hilar lymph nodes. Esophagus is unremarkable in appearance. No axillary lymphadenopathy. Lungs/Pleura: 5 mm subpleural nodule in the periphery of the right lower lobe (axial image 88 of series 4), stable on numerous prior examinations dating back to at least 09/29/2015, considered definitively benign (presumably a subpleural lymph node). No other suspicious appearing pulmonary nodules or masses are noted. No acute consolidative airspace disease. No pleural effusions. Musculoskeletal: There are no aggressive appearing lytic or blastic lesions noted in the visualized portions of the skeleton. CT ABDOMEN PELVIS FINDINGS Hepatobiliary: Again noted is a 1.4 cm heterogeneously enhancing lesion in between segments 6 and 7 of the liver (axial image 60 of series 2), compatible with a cavernous hemangioma. No other new cystic or solid hepatic lesions. However, immediately adjacent to segment 6 of the liver posteriorly best appreciated on axial image 65 of series 2 there is a subhepatic fluid collection or capsular implant which has ill-defined borders with the adjacent hepatic parenchyma, estimated to measure approximately 2.6 x 1.3 cm, with some altered perfusion in segment 6 of the liver, as well as potential dilated duct in segment 6 best appreciated on axial image 64 of series 2. No other more generalized intrahepatic or extrahepatic biliary ductal dilatation. Status post cholecystectomy. Pancreas: No pancreatic mass. No pancreatic ductal dilatation. No pancreatic or peripancreatic fluid or inflammatory changes. Spleen: Unremarkable. Adrenals/Urinary Tract: Subcentimeter low-attenuation lesions in both kidneys are too small to definitively characterize, but are statistically likely cysts. Bilateral adrenal glands are normal in appearance. Urinary bladder is normal in appearance. Stomach/Bowel: Normal  appearance of the stomach. No pathologic dilatation of small bowel or colon. Normal appendix. Vascular/Lymphatic: No significant atherosclerotic disease, aneurysm or dissection noted in the abdominal or pelvic vasculature. No lymphadenopathy noted in the abdomen or pelvis. Reproductive: Status post total abdominal hysterectomy and bilateral salpingo-oophorectomy. Other: Previously noted soft tissue implant in the left pericolic gutter is no longer confidently identified and has presumably regressed. As noted above, there is a subhepatic collection adjacent to segment 6 of the liver, or perhaps a capsular implant. No other definite intraperitoneal nodularity is confidently identified on today's examination. Status post omentectomy. No significant volume of ascites. No pneumoperitoneum. Musculoskeletal: There are no aggressive appearing lytic or blastic lesions noted in the visualized portions of the skeleton. IMPRESSION: 1. While the previously noted soft tissue implant in the left pericolic gutter has resolved, there is a new low-attenuation collection intimately associated with segment 6 of the liver which may represent a subhepatic fluid collection or capsular implant. This is associated with an apparent perfusion abnormality in segment 6 of the liver, as well as potential area of intrahepatic biliary ductal dilatation, as detailed above. Findings could reflect a focus of metastatic disease either to the liver or the hepatic capsule. This could be further evaluated with nonemergent MRI of the abdomen with and without IV gadolinium with MRCP. 2. No other sites of metastatic disease are noted elsewhere in the chest or pelvis. 3. Additional incidental findings, as above. Electronically Signed   By: Vinnie Langton M.D.   On: 05/26/2017 10:31   Mr 3d Recon At Scanner  Result Date: 06/03/2017 CLINICAL DATA:  Evaluate possible lesion within the liver identified on CT from 05/26/2017. History of ovarian cancer.  EXAM: MRI ABDOMEN WITHOUT AND WITH CONTRAST (INCLUDING MRCP) TECHNIQUE: Multiplanar multisequence MR imaging of the abdomen was performed  both before and after the administration of intravenous contrast. Heavily T2-weighted images of the biliary and pancreatic ducts were obtained, and three-dimensional MRCP images were rendered by post processing. CONTRAST:  19m MULTIHANCE GADOBENATE DIMEGLUMINE 529 MG/ML IV SOLN COMPARISON:  05/26/2017. FINDINGS: Lower chest: No acute findings. Hepatobiliary: Posterior right lobe of liver hemangioma measures 1.4 cm, image 22/3. No suspicious enhancing liver lesion identified to suggest parenchymal metastasis. There is a small volume of increased T2 signal along the inferior surface of segment 6 of the liver, image 34/3, image 37/3 and image 18/9. Previous cholecystectomy. No biliary dilatation. The common bile duct has a normal caliber. Pancreas: The pancreas appears normal. No main duct dilatation or inflammation. Spleen:  Within normal limits in size and appearance. Adrenals/Urinary Tract: The adrenal glands appear normal. T1 hyperintense and T2 hypointense lesion arising from the upper pole of left kidney measures 9 mm and likely represents a small hemorrhagic cysts. No mass or hydronephrosis identified. Stomach/Bowel: Visualized portions within the abdomen are unremarkable. Vascular/Lymphatic: No pathologically enlarged lymph nodes identified. No abdominal aortic aneurysm demonstrated. Other:  None Musculoskeletal: No suspicious bone lesions identified. IMPRESSION: 1. No definite enhancing liver parenchymal abnormality identified to suggest solid organ metastasis. 2. Again noted is focal area of fluid signal along the inferior aspect of segment 6 of the liver. In patient that has a history of known peritoneal disease this is favored to represent an area of loculated malignant ascites versus mucinous peritoneal implant. Electronically Signed   By: TKerby MoorsM.D.   On:  06/03/2017 09:44   Mr Abdomen Mrcp WMoise BoringContast  Result Date: 06/03/2017 CLINICAL DATA:  Evaluate possible lesion within the liver identified on CT from 05/26/2017. History of ovarian cancer. EXAM: MRI ABDOMEN WITHOUT AND WITH CONTRAST (INCLUDING MRCP) TECHNIQUE: Multiplanar multisequence MR imaging of the abdomen was performed both before and after the administration of intravenous contrast. Heavily T2-weighted images of the biliary and pancreatic ducts were obtained, and three-dimensional MRCP images were rendered by post processing. CONTRAST:  173mMULTIHANCE GADOBENATE DIMEGLUMINE 529 MG/ML IV SOLN COMPARISON:  05/26/2017. FINDINGS: Lower chest: No acute findings. Hepatobiliary: Posterior right lobe of liver hemangioma measures 1.4 cm, image 22/3. No suspicious enhancing liver lesion identified to suggest parenchymal metastasis. There is a small volume of increased T2 signal along the inferior surface of segment 6 of the liver, image 34/3, image 37/3 and image 18/9. Previous cholecystectomy. No biliary dilatation. The common bile duct has a normal caliber. Pancreas: The pancreas appears normal. No main duct dilatation or inflammation. Spleen:  Within normal limits in size and appearance. Adrenals/Urinary Tract: The adrenal glands appear normal. T1 hyperintense and T2 hypointense lesion arising from the upper pole of left kidney measures 9 mm and likely represents a small hemorrhagic cysts. No mass or hydronephrosis identified. Stomach/Bowel: Visualized portions within the abdomen are unremarkable. Vascular/Lymphatic: No pathologically enlarged lymph nodes identified. No abdominal aortic aneurysm demonstrated. Other:  None Musculoskeletal: No suspicious bone lesions identified. IMPRESSION: 1. No definite enhancing liver parenchymal abnormality identified to suggest solid organ metastasis. 2. Again noted is focal area of fluid signal along the inferior aspect of segment 6 of the liver. In patient that has a  history of known peritoneal disease this is favored to represent an area of loculated malignant ascites versus mucinous peritoneal implant. Electronically Signed   By: TaKerby Moors.D.   On: 06/03/2017 09:44     ELIGIBLE FOR AVAILABLE RESEARCH PROTOCOL:  [facility]  ASSESSMENT: 52 y.o. [place] woman status post radical tumor debulking with optimal cytoreduction (R0) 03/27/2015 for a stage IIIC, high-grade right fallopian tube carcinoma  (a) baseline CA-125 was1226.  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)  (1) adjuvant chemotherapy consisted of carboplatin and paclitaxel for 6 doses, begun 04/14/2015, completed 08/11/2015  (a) paclitaxel was omitted from cycle 5 and dose reduced on cycle 6 because of neuropathy   (b) a "make up" dose of paclitaxel was given 08/25/2015  (c) last carboplatin dose was 08/11/2015  (d) CA-125 normalized by 06/16/2015   (2) FIRST RECURRENCE: January 2018  (a) CA-125 rise beginning November 2017 led to CT scans and PET scans December 2017, all negative  (b) exploratory laparotomy 02/24/2016 showed pathologically confirmed recurrence, with miliary disease involving all examined surfaces  (c) port-site metastases noted 03/08/2016  (3) carboplatin/liposomal doxorubicin started 03/05/2016, repeated every three weeks x4, completed 06/04/2016.  (a) cycle 3 delayed one week because of neutropenia; OnPro added  (4) RUCAPARIB started 07/02/2016  (a) restaging 10/01/2016: Normal CA 125, negative CT of the abdomen and pelvis  (b) labs 11/24/2016 shows a rise in her CA 125-43.4, with continuing rise thereafter  (c) rucaparib discontinued October 2018  (5) anastrozole started December 23, 2016-stopped after a couple of weeks  SECOND RECURRENCE: (6)  Gemcitabine/Bevacizumab started on 02/04/2017  (a) gemcitabine omitted cycle 4 because of intercurrent infection  (b) gemcitabine resumed with cycle 5, with intervening rise in  Ca 125  (c) repeat CT scan of the abdomen and pelvis on 05/26/2017 does not confirm obvious disease progression  (d) cycle 6 of gemcitabine/bevacizumab delayed 1 week (started 05/27/2017  PLAN  Kathryn Ramsey is doing moderately well today.  Her CBC is stable and I reviewed this with her in detail (CMP pending).  She will proceed with treatment today.  We reviewed her scans.  I am not sure if her scans had been reviewed today.  I called the radiology reading room and spoke with Dr. Clovis Riley.  He and I discussed the MRCP and the CT of the abdomen and pelvis.  He didn't note anything that could attribute to the pain.  He let me know that he would review the CT with Dr. Weber Cooks, and if any addendum needed to be made, it would and would be resulted in her chart.   I will talk to Dr. Jana Hakim when he returns on Monday about this.   Shigeko will return in one week for labs and Gemcitabine.  She knows to call for any questions or concerns prior to her next appointment with [name].    A total of (30) minutes of face-to-face time was spent with this patient with greater than 50% of that time in counseling and care-coordination.   Wilber Bihari, NP 06/17/17 10:05 AM Medical Oncology and Hematology [facility] [address] [place], [place] [address] Tel. [phone]    Fax. [phone]

## 2017-06-18 LAB — CA 125: Cancer Antigen (CA) 125: 609.9 U/mL — ABNORMAL HIGH (ref 0.0–38.1)

## 2017-06-20 ENCOUNTER — Encounter: Payer: Self-pay | Admitting: Physical Therapy

## 2017-06-20 ENCOUNTER — Other Ambulatory Visit: Payer: Self-pay | Admitting: *Deleted

## 2017-06-20 ENCOUNTER — Other Ambulatory Visit: Payer: Self-pay | Admitting: Oncology

## 2017-06-20 ENCOUNTER — Encounter: Payer: Self-pay | Admitting: Oncology

## 2017-06-20 ENCOUNTER — Encounter: Payer: Self-pay | Admitting: Adult Health

## 2017-06-20 ENCOUNTER — Other Ambulatory Visit: Payer: Self-pay | Admitting: Adult Health

## 2017-06-20 ENCOUNTER — Ambulatory Visit: Payer: 59 | Attending: Oncology | Admitting: Physical Therapy

## 2017-06-20 DIAGNOSIS — M62838 Other muscle spasm: Secondary | ICD-10-CM | POA: Diagnosis present

## 2017-06-20 DIAGNOSIS — C5701 Malignant neoplasm of right fallopian tube: Secondary | ICD-10-CM

## 2017-06-20 DIAGNOSIS — M6281 Muscle weakness (generalized): Secondary | ICD-10-CM | POA: Diagnosis present

## 2017-06-20 DIAGNOSIS — C762 Malignant neoplasm of abdomen: Secondary | ICD-10-CM

## 2017-06-20 NOTE — Therapy (Signed)
[facility] [address] [place], [place], [address] Phone: [phone]   Fax:  [phone]  Physical Therapy Treatment  Patient Details  Name: Kathryn Ramsey MRN: [id number] Date of Birth: 02/17/1965 Referring Provider: Dr. Lurline Del   Encounter Date: 06/20/2017  PT End of Session - 06/20/17 1408    Visit Number  8    Date for PT Re-Evaluation  07/14/17    Authorization Type  [employer]    Authorization - Visit Number  8    Authorization - Number of Visits  23    PT Start Time  1400    PT Stop Time  1440    PT Time Calculation (min)  40 min    Activity Tolerance  Patient tolerated treatment well    Behavior During Therapy  [facility] for tasks assessed/performed       Past Medical History:  Diagnosis Date  . Acute sensory neuropathy 06/26/2015  . Allergy   . Anemia   . Asthma    illness induced asthma  . Blood transfusion without reported diagnosis    at age 52 years old D/T surgery to femur being crushed  . 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 Heart murmur    pt, states had a "working heart murmur"  . History of kidney stones   . PONV (postoperative nausea and vomiting)   . Skin cancer     Past Surgical History:  Procedure Laterality Date  . 3 laporscopic proceedures    . ABDOMINAL HYSTERECTOMY     2010  . CHOLECYSTECTOMY     2010  . DILATION AND CURETTAGE OF UTERUS     1997  . LAPAROSCOPY N/A 03/27/2015   Procedure: DIAGNOSTIC LAPAROSCOPY ;  Surgeon: [name], MD;  Location: [facility];  Service: Gynecology;  Laterality: N/A;  . LAPAROSCOPY N/A 02/24/2016   Procedure: LAPAROSCOPY DIAGNOSTIC WITH PERITONEAL WASHINGS AND PERITONEAL BIOPSY;  Surgeon: [name], MD;  Location: [facility];  Service: Gynecology;  Laterality: N/A;  . LAPAROTOMY N/A 03/27/2015   Procedure: EXPLORATORY LAPAROTOMY, BILATERAL SALPINGO OOPHORECTOMY, OMENTECTOMY,  RADICAL TUMOR DEBULKING;  Surgeon: [name], MD;  Location: [facility];  Service: Gynecology;  Laterality: N/A;  . sinus surgery 1994      There were no vitals filed for this visit.  Subjective Assessment - 06/20/17 1404    Subjective  I am the same.  After she urinates, patient will feel pressure and stiff. I feel the massage on my abdomen helps.  My numbers are climbing at a slow pace. My pressure is lower now in the pelvic area.  Bowels are working well.  I will be having a MRI of the pelvis to check things out.  I still have tenderness in the abdomen.      Patient Stated Goals  reduce abdominal discomfort    Currently in Pain?  Yes    Pain Score  4     Pain Location  Abdomen    Pain Orientation  Mid    Pain Descriptors / Indicators  Discomfort bloating    Pain Type  Acute pain    Pain Onset  More than a month ago    Pain Frequency  Constant    Aggravating Factors   not sure now due to treatments and fluid in the abdomen    Pain Relieving Factors  massage    Multiple  Pain Sites  No                    Pelvic Floor Special Questions - 06/20/17 0001    Pelvic Floor Internal Exam  Patient confirms identification and approves PT to assess and treat the pelvic floor    Exam Type  Vaginal    Strength  good squeeze, good lift, able to hold agaisnt strong resistance    Strength # of seconds  5        [facility] Adult PT Treatment/Exercise - 06/20/17 0001      Lumbar Exercises: Sidelying   Other Sidelying Lumbar Exercises  lay on side with hands forward and rotate trunk bil. 10 x each      Lumbar Exercises: Quadruped   [place]  10 reps feels pulling in the lower abdomen and pelvic floor with cow    Other Quadruped Lumbar Exercises  quadruped placing hand under the opposit shoulder      Manual Therapy   Manual Therapy  Internal Pelvic Floor    Internal Pelvic Floor  bil. obturator internist, bil. levator ani, bil. urethra sphincter             PT Education -  06/20/17 1442    Education provided  Yes    Education Details  return to using the vaginal massager to loosen up the tissue to up     Person(s) Educated  Patient    Methods  Explanation    Comprehension  Verbalized understanding       PT Short Term Goals - 05/12/17 1143      PT SHORT TERM GOAL #1   Title  independent with scar massage to improve tissue mobility    Time  4    Period  Weeks    Status  Achieved      PT SHORT TERM GOAL #2   Title  understand how to perform abdominal massage to promote peristalic mobility to improve constipation    Time  4    Period  Weeks    Status  Achieved        PT Long Term Goals - 06/20/17 1409      PT LONG TERM GOAL #1   Title  independent with HEP    Baseline  still learning    Time  4    Period  Months    Status  On-going      PT LONG TERM GOAL #2   Title  reduction of abdominal pain as she is standing due tp reduction of fascial tightness    Baseline  no change in tightness when standing and hve more pain    Time  12    Period  Weeks    Status  On-going      PT LONG TERM GOAL #3   Title  mobility of abdominal scars improved >/= 75% so patient is able to contract lower abdominals     Time  4    Period  Months    Status  Achieved            Plan - 06/20/17 1408    Clinical Impression Statement  Patient had less pulling in the umbilicus and no pulling of pelvic floor with cow position after manual work internally.  Patient has not had intercourse due to nervous about pain.  Pelvic floor strength is 4/5 with 5 second contraction.  Patient will be having a MRI of the lower pelvis.  Patient  will benefit from skilled therapy to reduce pain and pulling while reducing the bloating.     Rehab Potential  Excellent    Clinical Impairments Affecting Rehab Potential  recurrent cancer with last episode 02/04/2017; presently taking chemotherapy    PT Frequency  2x / week    PT Duration  12 weeks    PT Treatment/Interventions   Therapeutic activities;Therapeutic exercise;Patient/family education;Neuromuscular re-education;Manual techniques;Scar mobilization;Dry needling    PT Next Visit Plan  work around the umbilicus in quadruped; possible internal release    PT Home Exercise Plan  progress as needed    Consulted and Agree with Plan of Care  Patient       Patient will benefit from skilled therapeutic intervention in order to improve the following deficits and impairments:  Pain, Increased fascial restricitons, Increased muscle spasms, Decreased activity tolerance  Visit Diagnosis: Other muscle spasm  Muscle weakness (generalized)     Problem List Patient Active Problem List   Diagnosis Date Noted  . Lichen sclerosus et atrophicus of the vulva 03/31/2016  . Abdominal wall mass of right lower quadrant 03/08/2016  . 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 Goals of care, counseling/discussion 02/27/2016  . Elevated CA-125 02/24/2016  . Vaginal dryness, menopausal 10/01/2015  . Drug-induced peripheral neuropathy ([place]) 09/02/2015  . Leukopenia due to antineoplastic chemotherapy ([place]) 09/02/2015  . Chemotherapy-induced neuropathy ([place]) 08/19/2015  . Cellulitis 07/03/2015  . Acute sensory neuropathy 06/26/2015  . High risk medication use 06/21/2015  . Facial paresthesia 06/18/2015  . Genetic testing 06/16/2015  . Restless legs 05/14/2015  . Family history of breast cancer in female 04/29/2015  . Chemotherapy-induced nausea 04/22/2015  . Chemotherapy induced neutropenia ([place]) 04/22/2015  . Cancer of right fallopian tube ([place]) 04/07/2015  . History of hysterectomy for benign disease 03/21/2015  . IBS (irritable bowel syndrome) 03/21/2015  . Dense breast tissue 03/21/2015  . History of cholecystectomy 03/21/2015  . 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, [place], [place], [address] Phone: [phone]   Fax:  [phone]  Name: Kathryn Ramsey MRN: [id number] Date of Birth: 1965-11-03

## 2017-06-22 ENCOUNTER — Other Ambulatory Visit: Payer: Self-pay | Admitting: *Deleted

## 2017-06-22 ENCOUNTER — Telehealth: Payer: Self-pay | Admitting: *Deleted

## 2017-06-22 ENCOUNTER — Ambulatory Visit (HOSPITAL_COMMUNITY)
Admission: RE | Admit: 2017-06-22 | Discharge: 2017-06-22 | Disposition: A | Discharge status: Home / Self Care | Payer: 59 | Source: Ambulatory Visit | Attending: Adult Health | Admitting: Adult Health

## 2017-06-22 DIAGNOSIS — C762 Malignant neoplasm of abdomen: Secondary | ICD-10-CM | POA: Insufficient documentation

## 2017-06-22 DIAGNOSIS — C5701 Malignant neoplasm of right fallopian tube: Secondary | ICD-10-CM | POA: Diagnosis present

## 2017-06-22 MED ORDER — GADOBENATE DIMEGLUMINE 529 MG/ML IV SOLN
11.0000 mL | Freq: Once | INTRAVENOUS | Status: AC | PRN
  Administered 2017-06-22: 11 mL via INTRAVENOUS

## 2017-06-22 NOTE — Telephone Encounter (Signed)
This RN spoke with pharmacy due to need to change Onpro order due Friday 5/10 to Neulasta to be given on Monday 5/13 - order changed.  Note pt's daughter is graduating this coming weekend and pt has side effects post neulasta that she would like to avoid over the weekend.  Appointment request sent to scheduling.

## 2017-06-23 ENCOUNTER — Other Ambulatory Visit: Payer: Self-pay | Admitting: *Deleted

## 2017-06-24 ENCOUNTER — Telehealth: Payer: Self-pay | Admitting: Oncology

## 2017-06-24 ENCOUNTER — Telehealth: Payer: Self-pay | Admitting: *Deleted

## 2017-06-24 ENCOUNTER — Inpatient Hospital Stay: Payer: 59

## 2017-06-24 ENCOUNTER — Encounter: Payer: Self-pay | Admitting: Oncology

## 2017-06-24 VITALS — BP 120/61 | HR 60 | Temp 97.9°F | Resp 18

## 2017-06-24 DIAGNOSIS — C801 Malignant (primary) neoplasm, unspecified: Principal | ICD-10-CM

## 2017-06-24 DIAGNOSIS — C5701 Malignant neoplasm of right fallopian tube: Secondary | ICD-10-CM

## 2017-06-24 DIAGNOSIS — Z5111 Encounter for antineoplastic chemotherapy: Secondary | ICD-10-CM | POA: Diagnosis not present

## 2017-06-24 DIAGNOSIS — C762 Malignant neoplasm of abdomen: Secondary | ICD-10-CM

## 2017-06-24 DIAGNOSIS — C786 Secondary malignant neoplasm of retroperitoneum and peritoneum: Secondary | ICD-10-CM

## 2017-06-24 DIAGNOSIS — Z7189 Other specified counseling: Secondary | ICD-10-CM

## 2017-06-24 LAB — CBC WITH DIFFERENTIAL/PLATELET
Basophils Absolute: 0 10*3/uL (ref 0.0–0.1)
Basophils Relative: 0 %
Eosinophils Absolute: 0 10*3/uL (ref 0.0–0.5)
Eosinophils Relative: 0 %
HCT: 37.5 % (ref 34.8–46.6)
Hemoglobin: 12.1 g/dL (ref 11.6–15.9)
Lymphocytes Relative: 47 %
Lymphs Abs: 1.6 10*3/uL (ref 0.9–3.3)
MCH: 32.8 pg (ref 25.1–34.0)
MCHC: 32.3 g/dL (ref 31.5–36.0)
MCV: 101.6 fL — ABNORMAL HIGH (ref 79.5–101.0)
Monocytes Absolute: 0.3 10*3/uL (ref 0.1–0.9)
Monocytes Relative: 7 %
Neutro Abs: 1.6 10*3/uL (ref 1.5–6.5)
Neutrophils Relative %: 46 %
Platelets: 318 10*3/uL (ref 145–400)
RBC: 3.69 MIL/uL — ABNORMAL LOW (ref 3.70–5.45)
RDW: 17.3 % — ABNORMAL HIGH (ref 11.2–14.5)
WBC: 3.5 10*3/uL — ABNORMAL LOW (ref 3.9–10.3)

## 2017-06-24 LAB — COMPREHENSIVE METABOLIC PANEL
ALT: 112 U/L — ABNORMAL HIGH (ref 0–55)
AST: 75 U/L — ABNORMAL HIGH (ref 5–34)
Albumin: 4.6 g/dL (ref 3.5–5.0)
Alkaline Phosphatase: 85 U/L (ref 40–150)
Anion gap: 8 (ref 3–11)
BUN: 10 mg/dL (ref 7–26)
CO2: 27 mmol/L (ref 22–29)
Calcium: 10 mg/dL (ref 8.4–10.4)
Chloride: 103 mmol/L (ref 98–109)
Creatinine, Ser: 0.74 mg/dL (ref 0.60–1.10)
GFR calc Af Amer: >60 mL/min (ref >60)
GFR calc non Af Amer: >60 mL/min (ref >60)
Glucose, Bld: 78 mg/dL (ref 70–140)
Potassium: 4 mmol/L (ref 3.5–5.1)
Sodium: 138 mmol/L (ref 136–145)
Total Bilirubin: 0.3 mg/dL (ref 0.2–1.2)
Total Protein: 8 g/dL (ref 6.4–8.3)

## 2017-06-24 LAB — TOTAL PROTEIN, URINE DIPSTICK: Protein, ur: NEGATIVE mg/dL

## 2017-06-24 MED ORDER — SODIUM CHLORIDE 0.9 % IV SOLN
Freq: Once | INTRAVENOUS | Status: AC
  Administered 2017-06-24: 10:00:00 via INTRAVENOUS

## 2017-06-24 MED ORDER — PROCHLORPERAZINE MALEATE 10 MG PO TABS
10.0000 mg | ORAL_TABLET | Freq: Once | ORAL | Status: AC
  Administered 2017-06-24: 10 mg via ORAL

## 2017-06-24 MED ORDER — LORAZEPAM 2 MG/ML IJ SOLN
0.5000 mg | Freq: Once | INTRAMUSCULAR | Status: AC
  Administered 2017-06-24: 0.5 mg via INTRAVENOUS

## 2017-06-24 MED ORDER — LORAZEPAM 1 MG PO TABS
ORAL_TABLET | ORAL | Status: AC
  Filled 2017-06-24: qty 1

## 2017-06-24 MED ORDER — PROCHLORPERAZINE MALEATE 10 MG PO TABS
ORAL_TABLET | ORAL | Status: AC
  Filled 2017-06-24: qty 1

## 2017-06-24 MED ORDER — LORAZEPAM 2 MG/ML IJ SOLN
INTRAMUSCULAR | Status: AC
  Filled 2017-06-24: qty 1

## 2017-06-24 MED ORDER — SODIUM CHLORIDE 0.9 % IV SOLN
800.0000 mg/m2 | Freq: Once | INTRAVENOUS | Status: AC
  Administered 2017-06-24: 1254 mg via INTRAVENOUS
  Filled 2017-06-24: qty 32.98

## 2017-06-24 NOTE — Telephone Encounter (Signed)
Patient called to reschedule  °

## 2017-06-24 NOTE — Progress Notes (Signed)
Okay to  Treat with ALT of 112 per [employer].

## 2017-06-24 NOTE — Patient Instructions (Signed)
[facility] °Discharge Instructions for Patients Receiving Chemotherapy ° °Today you received the following chemotherapy agents Gemzar ° °To help prevent nausea and vomiting after your treatment, we encourage you to take your nausea medication as directed. °  °If you develop nausea and vomiting that is not controlled by your nausea medication, call the clinic.  ° °BELOW ARE SYMPTOMS THAT SHOULD BE REPORTED IMMEDIATELY: °· *FEVER GREATER THAN 100.5 F °· *CHILLS WITH OR WITHOUT FEVER °· NAUSEA AND VOMITING THAT IS NOT CONTROLLED WITH YOUR NAUSEA MEDICATION °· *UNUSUAL SHORTNESS OF BREATH °· *UNUSUAL BRUISING OR BLEEDING °· TENDERNESS IN MOUTH AND THROAT WITH OR WITHOUT PRESENCE OF ULCERS °· *URINARY PROBLEMS °· *BOWEL PROBLEMS °· UNUSUAL RASH °Items with * indicate a potential emergency and should be followed up as soon as possible. ° °Feel free to call the clinic should you have any questions or concerns. The clinic phone number is [phone]. ° °Please show the CHEMO ALERT CARD at check-in to the Emergency Department and triage nurse. ° ° °

## 2017-06-24 NOTE — Telephone Encounter (Signed)
Per call from treatment room nurse requesting " ok to treat " with noted ALT of 112 per lab draw today.  Dx - stage IV ovarian ca being treated with Avastin/Gemzar d1 Gemzar d8 q 21 days.  Today is d8 of cycle 6.  Noted previous elevations with d8 therapy with good recovery.  Ok to treat order obtained from Dr Lindi Adie- RN informed.

## 2017-06-27 ENCOUNTER — Inpatient Hospital Stay: Payer: 59

## 2017-06-27 ENCOUNTER — Encounter: Payer: Self-pay | Admitting: Oncology

## 2017-06-27 ENCOUNTER — Other Ambulatory Visit: Payer: Self-pay | Admitting: Adult Health

## 2017-06-27 ENCOUNTER — Telehealth: Payer: Self-pay | Admitting: Adult Health

## 2017-06-27 ENCOUNTER — Ambulatory Visit: Payer: 59

## 2017-06-27 ENCOUNTER — Other Ambulatory Visit: Payer: Self-pay | Admitting: Oncology

## 2017-06-27 VITALS — BP 131/91 | HR 72 | Temp 98.5°F | Resp 18

## 2017-06-27 DIAGNOSIS — C762 Malignant neoplasm of abdomen: Secondary | ICD-10-CM

## 2017-06-27 DIAGNOSIS — C801 Malignant (primary) neoplasm, unspecified: Principal | ICD-10-CM

## 2017-06-27 DIAGNOSIS — Z5111 Encounter for antineoplastic chemotherapy: Secondary | ICD-10-CM | POA: Diagnosis not present

## 2017-06-27 DIAGNOSIS — C5701 Malignant neoplasm of right fallopian tube: Secondary | ICD-10-CM

## 2017-06-27 DIAGNOSIS — C786 Secondary malignant neoplasm of retroperitoneum and peritoneum: Secondary | ICD-10-CM

## 2017-06-27 DIAGNOSIS — Z7189 Other specified counseling: Secondary | ICD-10-CM

## 2017-06-27 MED ORDER — PEGFILGRASTIM-CBQV 6 MG/0.6ML ~~LOC~~ SOSY
6.0000 mg | PREFILLED_SYRINGE | Freq: Once | SUBCUTANEOUS | Status: AC
  Administered 2017-06-27: 6 mg via SUBCUTANEOUS

## 2017-06-27 MED ORDER — PEGFILGRASTIM-CBQV 6 MG/0.6ML ~~LOC~~ SOSY
PREFILLED_SYRINGE | SUBCUTANEOUS | Status: AC
  Filled 2017-06-27: qty 0.6

## 2017-06-27 NOTE — Patient Instructions (Signed)
Pegfilgrastim injection ([facility]) What is this medicine? PEGFILGRASTIM (PEG fil gra stim) is a long-acting granulocyte colony-stimulating factor that stimulates the growth of neutrophils, a type of white blood cell important in the body's fight against infection. It is used to reduce the incidence of fever and infection in patients with certain types of cancer who are receiving chemotherapy that affects the bone marrow, and to increase survival after being exposed to high doses of radiation. This medicine may be used for other purposes; ask your health care provider or pharmacist if you have questions. COMMON BRAND NAME(S): Neulasta What should I tell my health care provider before I take this medicine? They need to know if you have any of these conditions: -kidney disease -latex allergy -ongoing radiation therapy -sickle cell disease -skin reactions to acrylic adhesives (On-Body Injector only) -an unusual or allergic reaction to pegfilgrastim, filgrastim, other medicines, foods, dyes, or preservatives -pregnant or trying to get pregnant -breast-feeding How should I use this medicine? This medicine is for injection under the skin. If you get this medicine at home, you will be taught how to prepare and give the pre-filled syringe or how to use the On-body Injector. Refer to the patient Instructions for Use for detailed instructions. Use exactly as directed. Tell your healthcare provider immediately if you suspect that the On-body Injector may not have performed as intended or if you suspect the use of the On-body Injector resulted in a missed or partial dose. It is important that you put your used needles and syringes in a special sharps container. Do not put them in a trash can. If you do not have a sharps container, call your pharmacist or healthcare provider to get one. Talk to your pediatrician regarding the use of this medicine in children. While this drug may be prescribed for selected  conditions, precautions do apply. Overdosage: If you think you have taken too much of this medicine contact a poison control center or emergency room at once. NOTE: This medicine is only for you. Do not share this medicine with others. What if I miss a dose? It is important not to miss your dose. Call your doctor or health care professional if you miss your dose. If you miss a dose due to an On-body Injector failure or leakage, a new dose should be administered as soon as possible using a single prefilled syringe for manual use. What may interact with this medicine? Interactions have not been studied. Give your health care provider a list of all the medicines, herbs, non-prescription drugs, or dietary supplements you use. Also tell them if you smoke, drink alcohol, or use illegal drugs. Some items may interact with your medicine. This list may not describe all possible interactions. Give your health care provider a list of all the medicines, herbs, non-prescription drugs, or dietary supplements you use. Also tell them if you smoke, drink alcohol, or use illegal drugs. Some items may interact with your medicine. What should I watch for while using this medicine? You may need blood work done while you are taking this medicine. If you are going to need a MRI, CT scan, or other procedure, tell your doctor that you are using this medicine (On-Body Injector only). What side effects may I notice from receiving this medicine? Side effects that you should report to your doctor or health care professional as soon as possible: -allergic reactions like skin rash, itching or hives, swelling of the face, lips, or tongue -dizziness -fever -pain, redness, or irritation at   site where injected -pinpoint red spots on the skin -red or dark-brown urine -shortness of breath or breathing problems -stomach or side pain, or pain at the shoulder -swelling -tiredness -trouble passing urine or change in the amount of  urine Side effects that usually do not require medical attention (report to your doctor or health care professional if they continue or are bothersome): -bone pain -muscle pain This list may not describe all possible side effects. Call your doctor for medical advice about side effects. You may report side effects to FDA at 1-800-FDA-1088. Where should I keep my medicine? Keep out of the reach of children. Store pre-filled syringes in a refrigerator between 2 and 8 degrees C (36 and 46 degrees F). Do not freeze. Keep in carton to protect from light. Throw away this medicine if it is left out of the refrigerator for more than 48 hours. Throw away any unused medicine after the expiration date. NOTE: This sheet is a summary. It may not cover all possible information. If you have questions about this medicine, talk to your doctor, pharmacist, or health care provider.  2018 Elsevier/Gold Standard (2016-01-29 12:58:03)  

## 2017-06-27 NOTE — Telephone Encounter (Signed)
Called and reviewed scans with patient.  She wanted to cancel her injection appt.  Instead she will come today at 430.  Schedule message sent.    Wilber Bihari, NP

## 2017-06-28 ENCOUNTER — Encounter: Payer: Self-pay | Admitting: Oncology

## 2017-06-28 ENCOUNTER — Telehealth: Payer: Self-pay | Admitting: Oncology

## 2017-06-28 NOTE — Telephone Encounter (Signed)
Requested [employer] testing for on accession [id number] with Maudie Mercury in pathology.

## 2017-06-29 ENCOUNTER — Ambulatory Visit: Payer: 59 | Admitting: Physical Therapy

## 2017-06-29 ENCOUNTER — Other Ambulatory Visit: Payer: Self-pay | Admitting: Oncology

## 2017-06-29 ENCOUNTER — Encounter: Payer: Self-pay | Admitting: Physical Therapy

## 2017-06-29 DIAGNOSIS — M62838 Other muscle spasm: Secondary | ICD-10-CM

## 2017-06-29 DIAGNOSIS — M6281 Muscle weakness (generalized): Secondary | ICD-10-CM

## 2017-06-29 NOTE — Progress Notes (Unsigned)
[facility]  [phone] Fax:(336) [phone]     ID: Kathryn Ramsey DOB: June 10, 1965  MR#: [id number]  IEP#:[id number]  Patient Care Team: Harlan Stains, MD as PCP - General (Family Medicine) [name], MD as Consulting Physician (Obstetrics and Gynecology) Juanita Craver, MD as Consulting Physician (Gastroenterology) Servando Salina, MD as Consulting Physician (Obstetrics and Gynecology) Nickie Retort, MD as Consulting Physician (Urology) Gillis Ends, MD as Referring Physician (Obstetrics and Gynecology) OTHER MD: Festus Aloe [phone])  CHIEF COMPLAINT: Recurrent ovarian cancer  CURRENT TREATMENT: Gemcitabine and bevacizumab started 02/04/2017  INTERVAL HISTORY: Kathryn Ramsey returns today for a follow-up and treatment of her recurrent ovarian cancer. She is accompanied by her husband. She is due to receive treatment with Gemcitabine and Bevacizumab today.  She is tolerating this treatment well.  She receives Neulasta on day 9, she says she tolerates this moderately well.    REVIEW OF SYSTEMS: Kathryn Ramsey continues to have a pelvic pressure.  Her last CA-125 is slightly elevated.  She has pelvic pressure.  She notes it deep in her pelvis and she is concerned that her cancer may be becoming more active. She wants to know if we were able to discuss with the radiologist and have him look lower in the pelvis at her scans.  Cedric says her bowels are working well.  She continues to exercise.  She denies any other issues and a detailed ROS was otherwise non contributory.   HISTORY OF PRESENT ILLNESS: From Dr. Serita Grit original intake note 03/17/2015:  "Kathryn Ramsey is a very pleasant G4P4 who is seen in consultation at the request of Dr Collene Mares and Dr Garwin Brothers for peritoneal carcinomatosis. The patient has a history of a workup by urology for gross hematuria which included a pelvic examine was suggested for extrinsic mass effect on the bladder on cystoscopy.  Cystoscopy took place on in December 2016. The patient denies abdominal pain bloating early satiety or abdominal distention.  On 08/03/2015 at [facility] she underwent a CT scan of the abdomen and pelvis as ordered by Dr Baruch Gouty. This revealed a 1.4 cm low attenuation lesion on the posterior right hepatic lobe suggestive of a hemangioma. Status post hysterectomy. No ovarian masses. Moderate ascites. Omental caking seen in the lateral left abdomen and pelvis. Peritoneal nodularity in the left lateral pelvic cul-de-sac. No gross extrinsic compression on the bladder was identified on imaging.  The patient was then seen by her gastroenterologist, Dr. Collene Mares, who performed a colonoscopy which was unremarkable.  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 She was treated adjuvantly with carboplatin and paclitaxel, as detailed below. Her CA 125 dropped from 1226 on 03/20/2015 to 26.1 by 06/16/2015.  Her subsequent history is as detailed below.  I PAST MEDICAL HISTORY: Past Medical History:  Diagnosis Date  . Acute sensory neuropathy 06/26/2015  . Allergy   . Anemia   . Asthma    illness induced asthma  . Blood transfusion without reported diagnosis    at age 52 years old D/T surgery to femur being crushed  . 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  Heart murmur    pt, states had a "working heart murmur"  . History of kidney stones   . PONV (postoperative nausea and vomiting)   . Skin cancer     PAST SURGICAL HISTORY: Past Surgical History:  Procedure Laterality Date  . 3  laporscopic proceedures    . ABDOMINAL HYSTERECTOMY     2010  . CHOLECYSTECTOMY     2010  . DILATION AND CURETTAGE OF UTERUS     1997  . LAPAROSCOPY N/A 03/27/2015   Procedure: DIAGNOSTIC LAPAROSCOPY ;  Surgeon: [name], MD;  Location: [facility];  Service: Gynecology;  Laterality: N/A;  . LAPAROSCOPY N/A 02/24/2016   Procedure: LAPAROSCOPY DIAGNOSTIC WITH PERITONEAL WASHINGS AND PERITONEAL BIOPSY;  Surgeon: [name], MD;  Location: [facility];  Service: Gynecology;  Laterality: N/A;  . LAPAROTOMY N/A 03/27/2015   Procedure: EXPLORATORY LAPAROTOMY, BILATERAL SALPINGO OOPHORECTOMY, OMENTECTOMY, RADICAL TUMOR DEBULKING;  Surgeon: [name], MD;  Location: [facility];  Service: Gynecology;  Laterality: N/A;  . sinus surgery 1994      FAMILY HISTORY Family History  Problem Relation Age of Onset  . Hypertension Father   . Skin cancer Father        nonmelanoma skin cancers in his late 17s  . Non-Hodgkin's lymphoma Maternal Aunt        dx. 58s; smoker  . Stroke Maternal Grandmother   . Other Mother        benign meningioma dx. early-mid-70s; hysterectomy in her late 65s for heavy periods - still has ovaries  . Other Son        one son with pre-cancerous skin findings  . Other Daughter        cysts on ovaries and hx of heavy periods  . Other Sister        hysterectomy for cysts - still has ovaries  . Heart attack Maternal Grandfather   . Heart attack Paternal Grandfather   . Renal cancer Maternal Aunt 60       smoker  . Breast cancer Maternal Aunt 78  . Other Maternal Aunt        dx. benign brain tumor (meningioma) at 63; 2nd benign brain tumor in her late 10s; hx of radical hysterectomy at age 25  . Brain cancer Other        NOS tumor  The patient's parents are both living, in their late 22s as of January 2018. The patient has one brother, 3 sisters. There is no history of ovarian cancer in the family. One maternal aunt had breast and kidney cancer, another had non-Hodgkin's  lymphoma.  GYNECOLOGIC HISTORY:  No LMP recorded. Patient has had a hysterectomy. 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 Her husband [name] is a Engineer, maintenance (IT). Their children are 53, 49, 34 and 61 y/o as of JAN 2018. The oldest works as an Electrical engineer in the [employer] in [place]. The other 3 children live in [place], the youngest currently attending [employer]. Rhe patient has no grandchildren. She attends [employer]    ADVANCED DIRECTIVES:    HEALTH MAINTENANCE: Social History   Tobacco Use  . Smoking status: Never Smoker  . Smokeless tobacco: Never Used  Substance Use Topics  . Alcohol use: Yes    Comment:  3 glasses wine or beer/week  . Drug use: No     Colonoscopy:2016  PAP: s/p hyst  Bone density:   Allergies  Allergen Reactions  . Bee Venom Shortness Of Breath    swelling  . Sulfa Antibiotics Shortness Of Breath    Vomit , diarrhea , hives  . Iodinated Diagnostic Agents Other (See Comments)    01/27/17 Pt reports sneezing and congestion. Pt states she takes claritin as a premed for CT scan in [place]. Premedicated with Benadryl 25 mg PO. Post CT,no sneezing but had mild drainage/congestion which resolved after a few minutes with oral fluids.    Current Outpatient Medications  Medication Sig Dispense Refill  . gabapentin (NEURONTIN) 100 MG capsule Take 1 capsule (100 mg total) by mouth at bedtime. Takes 120m at bedtime. Takes an additional 1071mduring the day if needed for nerve pain. (Patient not taking: Reported on 04/20/2017) 90 capsule 4  . loratadine (CLARITIN) 10 MG tablet Take 10 mg by mouth daily.    . ondansetron (ZOFRAN) 8 MG tablet Take 1 tablet (8 mg total) by mouth 2 (two) times daily as needed (Nausea or vomiting). (Patient not taking: Reported on 04/20/2017) 30 tablet 1  . valACYclovir (VALTREX) 500 MG tablet Take 1 tablet (500 mg total) by mouth  daily. (Patient not taking: Reported on 05/27/2017) 90 tablet 4   No current facility-administered medications for this visit.     OBJECTIVE:   There were no vitals filed for this visit.   There is no height or weight on file to calculate BMI.      ECOG FS:1 - Symptomatic but completely ambulatory GENERAL: Patient is a well appearing female in no acute distress HEENT:  Sclerae anicteric.  Oropharynx clear and moist. No ulcerations or evidence of oropharyngeal candidiasis. Neck is supple.  NODES:  No cervical, supraclavicular, or axillary lymphadenopathy palpated.  BREAST EXAM:  Deferred. LUNGS:  Clear to auscultation bilaterally.  No wheezes or rhonchi. HEART:  Regular rate and rhythm. No murmur appreciated. ABDOMEN:  Soft, nontender.  Positive, normoactive bowel sounds. No organomegaly palpated. MSK:  No focal spinal tenderness to palpation. Full range of motion bilaterally in the upper extremities. EXTREMITIES:  No peripheral edema.   SKIN:  Clear with no obvious rashes or skin changes. No nail dyscrasia. NEURO:  Nonfocal. Well oriented.  Appropriate affect.       LAB RESULTS:  CMP     Component Value Date/Time   NA 138 06/24/2017 0821   NA 140 02/11/2017 0755   K 4.0 06/24/2017 0821   K 4.4 02/11/2017 0755   CL 103 06/24/2017 0821   CO2 27 06/24/2017 0821   CO2 26 02/11/2017 0755   GLUCOSE 78 06/24/2017 0821   GLUCOSE 93 02/11/2017 0755   BUN 10 06/24/2017 0821   BUN 7.9 02/11/2017 0755   CREATININE 0.74 06/24/2017 0821   CREATININE 0.72 03/25/2017 0832   CREATININE 0.7 02/11/2017 0755   CALCIUM 10.0 06/24/2017 0821   CALCIUM 9.6 02/11/2017 0755   PROT 8.0 06/24/2017 0821   PROT 7.5 02/11/2017 0755   ALBUMIN 4.6 06/24/2017 0821   ALBUMIN 4.4 02/11/2017 0755   AST 75 (H) 06/24/2017 0821   AST 74 (H) 03/25/2017 0832   AST 27 02/11/2017 0755   ALT 112 (H) 06/24/2017 0821   ALT 155 (H) 03/25/2017 0832   ALT 24 02/11/2017 0755   ALKPHOS 85 06/24/2017 0821    ALKPHOS 58 02/11/2017 0755   BILITOT 0.3 06/24/2017 0821   BILITOT 0.4 03/25/2017 0832   BILITOT 0.43 02/11/2017 0755   GFRNONAA >60 06/24/2017 081572  GFRNONAA >60 03/25/2017 0832   GFRAA >60 06/24/2017 0821   GFRAA >60 03/25/2017 0832    INo results found for: SPEP, UPEP  Lab Results  Component Value Date   WBC 3.5 (L) 06/24/2017   NEUTROABS 1.6 06/24/2017   HGB 12.1 06/24/2017   HCT 37.5 06/24/2017   MCV 101.6 (H) 06/24/2017   PLT 318 06/24/2017      Chemistry      Component Value Date/Time   NA 138 06/24/2017 0821   NA 140 02/11/2017 0755   K 4.0 06/24/2017 0821   K 4.4 02/11/2017 0755   CL 103 06/24/2017 0821   CO2 27 06/24/2017 0821   CO2 26 02/11/2017 0755   BUN 10 06/24/2017 0821   BUN 7.9 02/11/2017 0755   CREATININE 0.74 06/24/2017 0821   CREATININE 0.72 03/25/2017 0832   CREATININE 0.7 02/11/2017 0755      Component Value Date/Time   CALCIUM 10.0 06/24/2017 0821   CALCIUM 9.6 02/11/2017 0755   ALKPHOS 85 06/24/2017 0821   ALKPHOS 58 02/11/2017 0755   AST 75 (H) 06/24/2017 0821   AST 74 (H) 03/25/2017 0832   AST 27 02/11/2017 0755   ALT 112 (H) 06/24/2017 0821   ALT 155 (H) 03/25/2017 0832   ALT 24 02/11/2017 0755   BILITOT 0.3 06/24/2017 0821   BILITOT 0.4 03/25/2017 0832   BILITOT 0.43 02/11/2017 0755       No results found for: LABCA2  No components found for: LABCA125  No results for input(s): INR in the last 168 hours.  Urinalysis    Component Value Date/Time   COLORURINE STRAW (A) 04/08/2017 1123   APPEARANCEUR CLEAR 04/08/2017 1123   LABSPEC 1.003 (L) 04/08/2017 1123   LABSPEC 1.005 10/09/2015 1239   PHURINE 8.0 04/08/2017 1123   GLUCOSEU NEGATIVE 04/08/2017 1123   GLUCOSEU Negative 10/09/2015 1239   HGBUR NEGATIVE 04/08/2017 1123   BILIRUBINUR NEGATIVE 04/08/2017 1123   BILIRUBINUR Negative 10/09/2015 [place] 04/08/2017 1123   PROTEINUR negative 06/24/2017 0830   UROBILINOGEN 0.2 10/09/2015 1239   NITRITE  NEGATIVE 04/08/2017 1123   LEUKOCYTESUR NEGATIVE 04/08/2017 1123   LEUKOCYTESUR Negative 10/09/2015 1239   Cancer Antigen (CA) 125 0.0 - 38.1 U/mL 560.6High   485.5High  CM 411.5High  CM 526.7High  CM     STUDIES:  Mr Pelvis W Wo Contrast  Result Date: 06/23/2017 CLINICAL DATA:  Right fallopian tube carcinoma. Peritoneal carcinomatosis. Rising tumor markers and worsening pelvic pressure. EXAM: MRI PELVIS WITHOUT AND WITH CONTRAST TECHNIQUE: Multiplanar multisequence MR imaging of the pelvis was performed both before and after administration of intravenous contrast. CONTRAST:  34m MULTIHANCE GADOBENATE DIMEGLUMINE 529 MG/ML IV SOLN COMPARISON:  CT on 05/26/2017 FINDINGS: Urinary Tract: Unremarkable urinary bladder. Bowel: Unremarkable pelvic bowel loops. Vascular/Lymphatic: No pathologically enlarged lymph nodes or other significant abnormality. Reproductive: Uterus: Previous hysterectomy.  Normal appearance of vaginal cuff. Right ovary: Surgically absent.  No adnexal mass identified. Left ovary:  Surgically absent.  No adnexal mass identified. Other: No abnormal peritoneal soft tissue density or enhancement. No ascites. Musculoskeletal:  Unremarkable. IMPRESSION: No evidence of recurrent or metastatic carcinoma within the pelvis. No other acute findings or significant abnormality. Electronically Signed   By: JEarle GellM.D.   On: 06/23/2017 09:51   Mr 3d Recon At Scanner  Result Date: 06/03/2017 CLINICAL DATA:  Evaluate possible lesion within the liver identified on CT from 05/26/2017. History of ovarian cancer. EXAM: MRI ABDOMEN WITHOUT AND WITH CONTRAST (  INCLUDING MRCP) TECHNIQUE: Multiplanar multisequence MR imaging of the abdomen was performed both before and after the administration of intravenous contrast. Heavily T2-weighted images of the biliary and pancreatic ducts were obtained, and three-dimensional MRCP images were rendered by post processing. CONTRAST:  47m MULTIHANCE GADOBENATE  DIMEGLUMINE 529 MG/ML IV SOLN COMPARISON:  05/26/2017. FINDINGS: Lower chest: No acute findings. Hepatobiliary: Posterior right lobe of liver hemangioma measures 1.4 cm, image 22/3. No suspicious enhancing liver lesion identified to suggest parenchymal metastasis. There is a small volume of increased T2 signal along the inferior surface of segment 6 of the liver, image 34/3, image 37/3 and image 18/9. Previous cholecystectomy. No biliary dilatation. The common bile duct has a normal caliber. Pancreas: The pancreas appears normal. No main duct dilatation or inflammation. Spleen:  Within normal limits in size and appearance. Adrenals/Urinary Tract: The adrenal glands appear normal. T1 hyperintense and T2 hypointense lesion arising from the upper pole of left kidney measures 9 mm and likely represents a small hemorrhagic cysts. No mass or hydronephrosis identified. Stomach/Bowel: Visualized portions within the abdomen are unremarkable. Vascular/Lymphatic: No pathologically enlarged lymph nodes identified. No abdominal aortic aneurysm demonstrated. Other:  None Musculoskeletal: No suspicious bone lesions identified. IMPRESSION: 1. No definite enhancing liver parenchymal abnormality identified to suggest solid organ metastasis. 2. Again noted is focal area of fluid signal along the inferior aspect of segment 6 of the liver. In patient that has a history of known peritoneal disease this is favored to represent an area of loculated malignant ascites versus mucinous peritoneal implant. Electronically Signed   By: TKerby MoorsM.D.   On: 06/03/2017 09:44   Mr Abdomen Mrcp WMoise BoringContast  Result Date: 06/03/2017 CLINICAL DATA:  Evaluate possible lesion within the liver identified on CT from 05/26/2017. History of ovarian cancer. EXAM: MRI ABDOMEN WITHOUT AND WITH CONTRAST (INCLUDING MRCP) TECHNIQUE: Multiplanar multisequence MR imaging of the abdomen was performed both before and after the administration of intravenous  contrast. Heavily T2-weighted images of the biliary and pancreatic ducts were obtained, and three-dimensional MRCP images were rendered by post processing. CONTRAST:  136mMULTIHANCE GADOBENATE DIMEGLUMINE 529 MG/ML IV SOLN COMPARISON:  05/26/2017. FINDINGS: Lower chest: No acute findings. Hepatobiliary: Posterior right lobe of liver hemangioma measures 1.4 cm, image 22/3. No suspicious enhancing liver lesion identified to suggest parenchymal metastasis. There is a small volume of increased T2 signal along the inferior surface of segment 6 of the liver, image 34/3, image 37/3 and image 18/9. Previous cholecystectomy. No biliary dilatation. The common bile duct has a normal caliber. Pancreas: The pancreas appears normal. No main duct dilatation or inflammation. Spleen:  Within normal limits in size and appearance. Adrenals/Urinary Tract: The adrenal glands appear normal. T1 hyperintense and T2 hypointense lesion arising from the upper pole of left kidney measures 9 mm and likely represents a small hemorrhagic cysts. No mass or hydronephrosis identified. Stomach/Bowel: Visualized portions within the abdomen are unremarkable. Vascular/Lymphatic: No pathologically enlarged lymph nodes identified. No abdominal aortic aneurysm demonstrated. Other:  None Musculoskeletal: No suspicious bone lesions identified. IMPRESSION: 1. No definite enhancing liver parenchymal abnormality identified to suggest solid organ metastasis. 2. Again noted is focal area of fluid signal along the inferior aspect of segment 6 of the liver. In patient that has a history of known peritoneal disease this is favored to represent an area of loculated malignant ascites versus mucinous peritoneal implant. Electronically Signed   By: TaKerby Moors.D.   On: 06/03/2017 09:44     ELIGIBLE  FOR AVAILABLE RESEARCH PROTOCOL:  discussing protocols at [facility] [place]: 52 y.o. [place] woman status post radical tumor debulking with  optimal cytoreduction (R0) 03/27/2015 for a stage IIIC, high-grade right fallopian tube carcinoma  (a) baseline CA-125 was1226.  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)  (1) adjuvant chemotherapy consisted of carboplatin and paclitaxel for 6 doses, begun 04/14/2015, completed 08/11/2015  (a) paclitaxel was omitted from cycle 5 and dose reduced on cycle 6 because of neuropathy   (b) a "make up" dose of paclitaxel was given 08/25/2015  (c) last carboplatin dose was 08/11/2015  (d) CA-125 normalized by 06/16/2015   (2) FIRST RECURRENCE: January 2018  (a) CA-125 rise beginning November 2017 led to CT scans and PET scans December 2017, all negative  (b) exploratory laparotomy 02/24/2016 showed pathologically confirmed recurrence, with miliary disease involving all examined surfaces  (c) port-site metastases noted 03/08/2016  (3) carboplatin/liposomal doxorubicin started 03/05/2016, repeated every three weeks x4, completed 06/04/2016.  (a) cycle 3 delayed one week because of neutropenia; OnPro added  (4) RUCAPARIB started 07/02/2016  (a) restaging 10/01/2016: Normal CA 125, negative CT of the abdomen and pelvis  (b) labs 11/24/2016 shows a rise in her CA 125-43.4, with continuing rise thereafter  (c) rucaparib discontinued October 2018  (5) anastrozole started December 23, 2016-stopped after a couple of weeks  SECOND RECURRENCE: (6) Gemcitabine/Bevacizumab started on 02/04/2017  (a) gemcitabine omitted cycle 4 because of intercurrent infection  (b) gemcitabine resumed with cycle 5, with intervening rise in  Ca 125  (c) repeat CT scan of the abdomen and pelvis on 05/26/2017 does not confirm obvious disease progression  (d) cycle 6 of gemcitabine/bevacizumab delayed 1 week (started 05/27/2017  PLAN  Kathryn Ramsey is doing  moderately well today.  Her CBC is stable and I reviewed this with her in detail (CMP pending).  She will proceed with treatment today.  We reviewed her scans.  I am not sure if her scans had been reviewed today.  I called the radiology reading room and spoke with Dr. Clovis Riley.  He and I discussed the MRCP and the CT of the abdomen and pelvis.  He didn't note anything that could attribute to the pain.  He let me know that he would review the CT with Dr. Weber Cooks, and if any addendum needed to be made, it would and would be resulted in her chart.   I will talk to Dr. Jana Hakim when he returns on Monday about this.   Avril will return in one week for labs and Gemcitabine.  She knows to call for any questions or concerns prior to her next appointment with [name].    A total of (30) minutes of face-to-face time was spent with this patient with greater than 50% of that time in counseling and care-coordination.   Wilber Bihari, NP 06/29/17 1:06 PM Medical Oncology and Hematology [facility] [address] [place],  [address] Tel. [phone]    Fax. [phone]
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
## 2017-06-30 ENCOUNTER — Other Ambulatory Visit: Payer: Self-pay | Admitting: Oncology

## 2017-07-01 NOTE — Progress Notes (Signed)
[place]  [phone]) [phone] Fax:(336) [phone]     ID: Kathryn Ramsey DOB: 28-Mar-1965  MR#: [id number]  ZMO#:[id number]  Patient Care Team: Harlan Stains, MD as PCP - General (Family Medicine) [name], MD as Consulting Physician (Obstetrics and Gynecology) Juanita Craver, MD as Consulting Physician (Gastroenterology) Servando Salina, MD as Consulting Physician (Obstetrics and Gynecology) Nickie Retort, MD as Consulting Physician (Urology) Gillis Ends, MD as Referring Physician (Obstetrics and Gynecology) OTHER MD: Festus Aloe [phone])  CHIEF COMPLAINT: Recurrent ovarian cancer  CURRENT TREATMENT: Abraxane  INTERVAL HISTORY: Kathryn Ramsey returns today for a follow-up and treatment of her recurrent ovarian cancer accompanied by her husband. Her last dose of gemcitabine/ bevacizumab was on 06/24/2017 discontinued due to disease progression. Today is day 11 of cycle 6. She notes an increase in headaches particularly after chemotherapy. She feels some pain, tenderness, and discomfort in her abdomen that is also painful to the touch. On days 4 and 5, she feels nauseous, but she says this is minimal and not bad enough to take medication. She denies issues with taste, appetite, or bowel movements. She also has some pain and tightness in her ankles that extend to her calves. She feels uneasy when standing and difficulty walking. The right big toe is numb at night. She denies numbness in her hands or fingers.    Since her last visit, she completed a MRI pelvis on 06/23/2017 showing: No evidence of recurrent or metastatic carcinoma within the pelvis. No other acute findings or significant abnormality   REVIEW OF SYSTEMS: Asiyah reports that she continues to work with Earlie Counts concerning her abdominal discomfort, but physical therapy techniques have only temporarily helped. For exercise, she walks a few times during the week. She tries to go to [employer] at least once per week. She denies unusual visual changes, nausea, vomiting, or dizziness. There has been no unusual cough, phlegm production, or pleurisy. This been no change in bowel or bladder habits. She denies unexplained fatigue or unexplained weight loss, bleeding, rash, or fever. A detailed review of systems was otherwise stable.   I queried Shoni in great detail regarding possible neuropathy symptoms from her earlier treatments.  She tells me her right big toe sometimes goes numb at night.  When she wakes up in the morning she has a feeling in the back of the legs, but not in her toes or feet, of tightness.  This is probably musculoskeletal and not related to neuropathy.  She has no numbness or tingling in her finger pads or toe pads except as noted   HISTORY OF PRESENT ILLNESS: From Dr. Serita Grit original intake note 03/17/2015:  "Kathryn Ramsey is a very pleasant G4P4 who is seen in consultation at the request of Dr Collene Mares and Dr Garwin Brothers for peritoneal carcinomatosis. The patient has a history of a workup by urology for gross hematuria which included a pelvic examine was suggested for extrinsic mass effect on the bladder on cystoscopy. Cystoscopy took place on in December 2016. The patient denies abdominal pain bloating early satiety or abdominal distention.  On 08/03/2015 at [facility] she underwent a CT scan of the abdomen and pelvis as ordered by Dr Baruch Gouty. This revealed a 1.4 cm low attenuation lesion on the posterior right hepatic lobe suggestive of a hemangioma. Status post hysterectomy. No ovarian masses. Moderate ascites. Omental caking seen in the lateral left abdomen and pelvis. Peritoneal nodularity in the left lateral pelvic cul-de-sac. No gross extrinsic compression on  the bladder was identified on imaging.  The patient was then seen by her gastroenterologist, Dr. Collene Mares, who performed a colonoscopy which was unremarkable.  Tumor markers were drawn on 08/04/2015  and these included a CA-125 that was elevated to 957, and a CEA that was normal at 1."  On 03/27/2015 the patient underwent diagnostic laparoscopy, exploratory laparotomy with bilateral salpingo-oophorectomy, omentectomy, and radical tumor debulking with optimal cytoreduction (R0) of what proved to be a stage IIIc right fallopian tube cancer. She was treated adjuvantly with carboplatin and paclitaxel, as detailed below. Her CA 125 dropped from 1226 on 03/20/2015 to 26.1 by 06/16/2015.  Her subsequent history is as detailed below.  I PAST MEDICAL HISTORY: Past Medical History:  Diagnosis Date  . Acute sensory neuropathy 06/26/2015  . Allergy   . Anemia   . Asthma    illness induced asthma  . Blood transfusion without reported diagnosis    at age 52 years old D/T surgery to femur being crushed  . 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 Heart murmur    pt, states had a "working heart murmur"  . History of kidney stones   . PONV (postoperative nausea and vomiting)   . Skin cancer     PAST SURGICAL HISTORY: Past Surgical History:  Procedure Laterality Date  . 3 laporscopic proceedures    . ABDOMINAL HYSTERECTOMY     2010  . CHOLECYSTECTOMY     2010  . DILATION AND CURETTAGE OF UTERUS     1997  . LAPAROSCOPY N/A 03/27/2015   Procedure: DIAGNOSTIC LAPAROSCOPY ;  Surgeon: [name], MD;  Location: [facility];  Service: Gynecology;  Laterality: N/A;  . LAPAROSCOPY N/A 02/24/2016   Procedure: LAPAROSCOPY DIAGNOSTIC WITH PERITONEAL WASHINGS AND PERITONEAL BIOPSY;  Surgeon: [name], MD;  Location: [facility];  Service: Gynecology;  Laterality: N/A;  . LAPAROTOMY N/A 03/27/2015   Procedure: EXPLORATORY LAPAROTOMY, BILATERAL SALPINGO OOPHORECTOMY, OMENTECTOMY, RADICAL TUMOR DEBULKING;  Surgeon: [name], MD;  Location: [facility];  Service: Gynecology;  Laterality: N/A;  . sinus surgery 1994      FAMILY HISTORY Family  History  Problem Relation Age of Onset  . Hypertension Father   . Skin cancer Father        nonmelanoma skin cancers in his late 55s  . Non-Hodgkin's lymphoma Maternal Aunt        dx. 54s; smoker  . Stroke Maternal Grandmother   . Other Mother        benign meningioma dx. early-mid-70s; hysterectomy in her late 61s for heavy periods - still has ovaries  . Other Son        one son with pre-cancerous skin findings  . Other Daughter        cysts on ovaries and hx of heavy periods  . Other Sister        hysterectomy for cysts - still has ovaries  . Heart attack Maternal Grandfather   . Heart attack Paternal Grandfather   . Renal cancer Maternal Aunt 60       smoker  . Breast cancer Maternal Aunt 78  . Other Maternal Aunt        dx. benign brain tumor (meningioma) at 37; 2nd benign brain tumor in her late 57s; hx of radical hysterectomy at age 27  . Brain cancer Other        NOS tumor  The patient's  parents are both living, in their late 37s as of January 2018. The patient has one brother, 3 sisters. There is no history of ovarian cancer in the family. One maternal aunt had breast and kidney cancer, another had non-Hodgkin's lymphoma.  GYNECOLOGIC HISTORY:  No LMP recorded. Patient has had a hysterectomy. 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 Her husband [name] is a Engineer, maintenance (IT). Their children are 33, 24, 28 and 26 y/o as of JAN 2018. The oldest works as an Electrical engineer in the [employer] in [place]. The other 3 children live in [place], the youngest currently attending [employer]. Rhe patient has no grandchildren. She attends [employer]    ADVANCED DIRECTIVES:    HEALTH MAINTENANCE: Social History   Tobacco Use  . Smoking status: Never Smoker  . Smokeless tobacco: Never Used  Substance Use Topics  . Alcohol use: Yes    Comment:  3 glasses wine or beer/week  . Drug  use: No     Colonoscopy:2016  PAP: s/p hyst  Bone density:   Allergies  Allergen Reactions  . Bee Venom Shortness Of Breath    swelling  . Sulfa Antibiotics Shortness Of Breath    Vomit , diarrhea , hives  . Iodinated Diagnostic Agents Other (See Comments)    01/27/17 Pt reports sneezing and congestion. Pt states she takes claritin as a premed for CT scan in [place]. Premedicated with Benadryl 25 mg PO. Post CT,no sneezing but had mild drainage/congestion which resolved after a few minutes with oral fluids.    Current Outpatient Medications  Medication Sig Dispense Refill  . gabapentin (NEURONTIN) 100 MG capsule Take 1 capsule (100 mg total) by mouth at bedtime. Takes 12m at bedtime. Takes an additional 1018mduring the day if needed for nerve pain. (Patient not taking: Reported on 04/20/2017) 90 capsule 4  . loratadine (CLARITIN) 10 MG tablet Take 10 mg by mouth daily.    . ondansetron (ZOFRAN) 8 MG tablet Take 1 tablet (8 mg total) by mouth 2 (two) times daily as needed (Nausea or vomiting). (Patient not taking: Reported on 04/20/2017) 30 tablet 1  . valACYclovir (VALTREX) 500 MG tablet Take 1 tablet (500 mg total) by mouth daily. (Patient not taking: Reported on 05/27/2017) 90 tablet 4   No current facility-administered medications for this visit.     OBJECTIVE: Middle-aged white woman who appears stated age  Vi3  07/04/17 0830  BP: 115/83  Pulse: 63  Resp: 18  Temp: 97.8 F (36.6 C)  SpO2: 100%     Body mass index is 21.03 kg/m.      ECOG FS:1 - Symptomatic but completely ambulatory  Sclerae unicteric, EOMs intact Oropharynx clear and moist No cervical or supraclavicular adenopathy Lungs no rales or rhonchi Heart regular rate and rhythm Abd soft, minimally tender to palpation, positive bowel sounds, no masses palpated MSK no focal spinal tenderness, no upper extremity lymphedema Neuro: nonfocal, well oriented, appropriate affect Breasts: Deferred Skin: The port  site metastases remain flat, and active  LAB RESULTS:  CMP     Component Value Date/Time   NA 138 06/24/2017 0821   NA 140 02/11/2017 0755   K 4.0 06/24/2017 0821   K 4.4 02/11/2017 0755   CL 103 06/24/2017 0821   CO2 27 06/24/2017 0821   CO2 26 02/11/2017 0755   GLUCOSE 78 06/24/2017 088756  GLUCOSE 93 02/11/2017 0755   BUN 10 06/24/2017 0821   BUN 7.9 02/11/2017 0755   CREATININE 0.74 06/24/2017 0821   CREATININE 0.72 03/25/2017 0832   CREATININE 0.7 02/11/2017 0755   CALCIUM 10.0 06/24/2017 0821   CALCIUM 9.6 02/11/2017 0755   PROT 8.0 06/24/2017 0821   PROT 7.5 02/11/2017 0755   ALBUMIN 4.6 06/24/2017 0821   ALBUMIN 4.4 02/11/2017 0755   AST 75 (H) 06/24/2017 0821   AST 74 (H) 03/25/2017 0832   AST 27 02/11/2017 0755   ALT 112 (H) 06/24/2017 0821   ALT 155 (H) 03/25/2017 0832   ALT 24 02/11/2017 0755   ALKPHOS 85 06/24/2017 0821   ALKPHOS 58 02/11/2017 0755   BILITOT 0.3 06/24/2017 0821   BILITOT 0.4 03/25/2017 0832   BILITOT 0.43 02/11/2017 0755   GFRNONAA >60 06/24/2017 0821   GFRNONAA >60 03/25/2017 0832   GFRAA >60 06/24/2017 0821   GFRAA >60 03/25/2017 0832    INo results found for: SPEP, UPEP  Lab Results  Component Value Date   WBC 3.5 (L) 06/24/2017   NEUTROABS 1.6 06/24/2017   HGB 12.1 06/24/2017   HCT 37.5 06/24/2017   MCV 101.6 (H) 06/24/2017   PLT 318 06/24/2017      Chemistry      Component Value Date/Time   NA 138 06/24/2017 0821   NA 140 02/11/2017 0755   K 4.0 06/24/2017 0821   K 4.4 02/11/2017 0755   CL 103 06/24/2017 0821   CO2 27 06/24/2017 0821   CO2 26 02/11/2017 0755   BUN 10 06/24/2017 0821   BUN 7.9 02/11/2017 0755   CREATININE 0.74 06/24/2017 0821   CREATININE 0.72 03/25/2017 0832   CREATININE 0.7 02/11/2017 0755      Component Value Date/Time   CALCIUM 10.0 06/24/2017 0821   CALCIUM 9.6 02/11/2017 0755   ALKPHOS 85 06/24/2017 0821   ALKPHOS 58 02/11/2017 0755   AST 75 (H) 06/24/2017 0821   AST 74 (H) 03/25/2017  0832   AST 27 02/11/2017 0755   ALT 112 (H) 06/24/2017 0821   ALT 155 (H) 03/25/2017 0832   ALT 24 02/11/2017 0755   BILITOT 0.3 06/24/2017 0821   BILITOT 0.4 03/25/2017 0832   BILITOT 0.43 02/11/2017 0755       No results found for: LABCA2  No components found for: QZESP233  No results for input(s): INR in the last 168 hours.  Urinalysis    Component Value Date/Time   COLORURINE STRAW (A) 04/08/2017 1123   APPEARANCEUR CLEAR 04/08/2017 1123   LABSPEC 1.003 (L) 04/08/2017 1123   LABSPEC 1.005 10/09/2015 1239   PHURINE 8.0 04/08/2017 1123   GLUCOSEU NEGATIVE 04/08/2017 1123   GLUCOSEU Negative 10/09/2015 1239   HGBUR NEGATIVE 04/08/2017 1123   BILIRUBINUR NEGATIVE 04/08/2017 1123   BILIRUBINUR Negative 10/09/2015 [place] 04/08/2017 1123   PROTEINUR negative 06/24/2017 0830   UROBILINOGEN 0.2 10/09/2015 1239   NITRITE NEGATIVE 04/08/2017 1123   LEUKOCYTESUR NEGATIVE 04/08/2017 1123   LEUKOCYTESUR Negative 10/09/2015 1239   CA 125 was 411 2 months ago has steadily risen to the current 609.9  STUDIES:  Mr Pelvis W Wo Contrast  Result Date: 06/23/2017 CLINICAL DATA:  Right fallopian tube carcinoma. Peritoneal carcinomatosis. Rising tumor markers and worsening pelvic pressure. EXAM: MRI PELVIS WITHOUT AND WITH CONTRAST TECHNIQUE: Multiplanar multisequence MR imaging of the pelvis was performed both before and after administration of intravenous contrast. CONTRAST:  44m MULTIHANCE GADOBENATE DIMEGLUMINE 529 MG/ML  IV SOLN COMPARISON:  CT on 05/26/2017 FINDINGS: Urinary Tract: Unremarkable urinary bladder. Bowel: Unremarkable pelvic bowel loops. Vascular/Lymphatic: No pathologically enlarged lymph nodes or other significant abnormality. Reproductive: Uterus: Previous hysterectomy.  Normal appearance of vaginal cuff. Right ovary: Surgically absent.  No adnexal mass identified. Left ovary:  Surgically absent.  No adnexal mass identified. Other: No abnormal  peritoneal soft tissue density or enhancement. No ascites. Musculoskeletal:  Unremarkable. IMPRESSION: No evidence of recurrent or metastatic carcinoma within the pelvis. No other acute findings or significant abnormality. Electronically Signed   By: Earle Gell M.D.   On: 06/23/2017 09:51     ELIGIBLE FOR AVAILABLE RESEARCH PROTOCOL:  discussing protocols at [facility] [place]: 52 y.o. BRCA negative [place] woman status post radical tumor debulking with optimal cytoreduction (R0) 03/27/2015 for a stage IIIC, high-grade right fallopian tube carcinoma  (a) baseline CA-125 was1226.  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)  (c) tumor is PD-L1 negative (02/24/2016)  (d) tumor is strongly estrogen receptor positive, progesterone receptor negative (02/24/2016)  (1) adjuvant chemotherapy consisted of carboplatin and paclitaxel for 6 doses, begun 04/14/2015, completed 08/11/2015  (a) paclitaxel was omitted from cycle 5 and dose reduced on cycle 6 because of neuropathy   (b) a "make up" dose of paclitaxel was given 08/25/2015  (c) last carboplatin dose was 08/11/2015  (d) CA-125 normalized by 06/16/2015   (2) FIRST RECURRENCE: January 2018  (a) CA-125 rise beginning November 2017 led to CT scans and PET scans December 2017, all negative  (b) exploratory laparotomy 02/24/2016 showed pathologically confirmed recurrence, with miliary disease involving all examined surfaces  (c) port-site metastases noted 03/08/2016  (3) carboplatin/liposomal doxorubicin started 03/05/2016, repeated every three weeks x4, completed 06/04/2016.  (a) cycle 3 delayed one week because of neutropenia; OnPro added  (b) CA 125 normalized after cycle 3  (4) RUCAPARIB maintenance started 07/02/2016  (a) restaging 10/01/2016: Normal CA  125, negative CT of the abdomen and pelvis  (b) labs 11/24/2016 shows a rise in her CA 125-43.4, with continuing rise thereafter  (c) rucaparib discontinued October 2018 with rising CA 125  (5) anastrozole started December 23, 2016-stopped after a couple of weeks  SECOND RECURRENCE: (6) Gemcitabine/Bevacizumab started on 02/04/2017  (a) gemcitabine omitted cycle 4 because of intercurrent infection  (b) gemcitabine resumed with cycle 5, with intervening rise in  Ca 125  (c) repeat CT scan of the abdomen and pelvis on 05/26/2017 does not confirm obvious disease progression  (d) cycle 5 of gemcitabine/bevacizumab delayed 1 week   (e) gemcitabine/ bevacizumab discontinued after 6 cycles, with rising CA 125 (last dose 06/24/2017)  (7) foundation one testing in process  (8) Abraxane to start 07/12/2017, to be repeated weekly as tolerated  PLAN I spent approximately 45 minutes today with Lelan Pons and her husband [name] so she would have a good understanding of her choices.  Ellar's ovarian cancer is clearly progressing.  Her Ca1 25 is rising and she is more symptomatic. However she has no measurable disease at present.  MRI of the pelvis on 06/23/2017 was negative.  MRI of the abdomen with MRCP 06/03/2017 shows a focal area of fluid signal along the inferior liver which could be loculated ascites.  What this means is that she likely will not qualify for a clinical trial at this point.  We have sent all this documentation  to Dr Robina Ade at [facility] and he has communicated directly with Lelan Pons.  In the absence of any measurable disease at present he has no trials to offer.  If she does not qualify for trial and the question is whether to treat or not to treat her void is strongly for treatment since she is symptomatic.  She wants to "beat this down" with strong treatment and then take some time off therapy in the fall if possible  The question is then how to proceed.  I think her best bet is Abraxane.   Of course other taxanes including Taxol and Taxotere could be considered.  Another possibility is topotecan.  This is generally well-tolerated.  We could do 2 cycles and assess for response in terms of symptoms and Ca1 25.  In the meantime we will get results of the foundation one test which may give [name] further targets.  If there is progression through the planned treatment I would next go to carboplatin, most likely with a second agent which could again be Doxil, or navelbine, or a very low docetaxel dose.  If the tumor responds to the [place] I would follow that up with niraparib.  After much discussion she decided she would like to give Abraxane a try.  We talked about the possible toxicity side effects and complications of this agent including of course hair loss, but also peripheral neuropathy, nail changes, fatigue, and low blood counts.  Going to start 07/12/2017 note that Ermie is going to be in [place] and then at the beach between July 9 and July 27 of this year.  This means there might not be a treatment between July 2 and July 30.  We will see what we can do about that when we get a little closer to those dates.  Of course if and when she develops measurable disease at any point that she might qualify for a study.   She has a good understanding of this plan.  I am going to see her with her day 8 treatment to make sure there have been no unusual toxicities.  She knows to call for any other issues that may develop before the next visit.  Ovadia Lopp, Virgie Dad, MD  07/04/17 12:29 PM Medical Oncology and Hematology [facility] [address] [place], [place] [address] Tel. [phone]    Fax. [phone]  This document serves as a record of services personally performed by Lurline Del, MD. It was created on his behalf by Sheron Nightingale, a trained medical scribe. The creation of this record is based on the scribe's personal observations and the provider's statements to  them.   I have reviewed the above documentation for accuracy and completeness, and I agree with the above.

## 2017-07-04 ENCOUNTER — Telehealth: Payer: Self-pay | Admitting: Oncology

## 2017-07-04 ENCOUNTER — Inpatient Hospital Stay: Payer: 59 | Admitting: Oncology

## 2017-07-04 VITALS — BP 115/83 | HR 63 | Temp 97.8°F | Resp 18 | Ht 63.0 in | Wt 118.7 lb

## 2017-07-04 DIAGNOSIS — C5701 Malignant neoplasm of right fallopian tube: Secondary | ICD-10-CM | POA: Diagnosis not present

## 2017-07-04 DIAGNOSIS — Z5111 Encounter for antineoplastic chemotherapy: Secondary | ICD-10-CM | POA: Diagnosis not present

## 2017-07-04 DIAGNOSIS — R109 Unspecified abdominal pain: Secondary | ICD-10-CM | POA: Diagnosis not present

## 2017-07-04 DIAGNOSIS — Z7189 Other specified counseling: Secondary | ICD-10-CM

## 2017-07-04 DIAGNOSIS — C762 Malignant neoplasm of abdomen: Secondary | ICD-10-CM

## 2017-07-04 DIAGNOSIS — G620 Drug-induced polyneuropathy: Secondary | ICD-10-CM

## 2017-07-04 DIAGNOSIS — C801 Malignant (primary) neoplasm, unspecified: Secondary | ICD-10-CM

## 2017-07-04 DIAGNOSIS — T451X5A Adverse effect of antineoplastic and immunosuppressive drugs, initial encounter: Secondary | ICD-10-CM

## 2017-07-04 DIAGNOSIS — C786 Secondary malignant neoplasm of retroperitoneum and peritoneum: Secondary | ICD-10-CM | POA: Diagnosis not present

## 2017-07-04 MED ORDER — PROCHLORPERAZINE MALEATE 10 MG PO TABS: 10.0000 mg | ORAL_TABLET | Freq: Four times a day (QID) | ORAL | 1 refills | Status: DC | PRN

## 2017-07-04 NOTE — Telephone Encounter (Signed)
Called regarding 5/28

## 2017-07-04 NOTE — Progress Notes (Signed)
DISCONTINUE OFF PATHWAY REGIMEN - Ovarian   [id number]:Gemcitabine 1,000 mg/m2 D1, 8  q21 Days:   A cycle is every 21 days:     Gemcitabine   **Always confirm dose/schedule in your pharmacy ordering system**    REASON: Disease Progression PRIOR TREATMENT: Off Pathway: Gemcitabine 1,000 mg/m2 D1, 8  q21 Days TREATMENT RESPONSE: Progressive Disease (PD)  START OFF PATHWAY REGIMEN - Ovarian   [id number]:Nab-Paclitaxel (Abraxane(R)) 100 mg/m2 D1,8,15 q28 days:   A cycle is every 28 days (3 weeks on and 1 week off):     Nab-paclitaxel (protein bound)   **Always confirm dose/schedule in your pharmacy ordering system**    Patient Characteristics: Recurrent or Progressive Disease, Third Line Therapy, Platinum Sensitive and >  6 Months Since Last Platinum Therapy, BRCA Mutation Absent/Unknown Therapeutic Status: Recurrent or Progressive Disease AJCC T Category: T3b AJCC N Category: NX AJCC M Category: M0 AJCC 8 Stage Grouping: IIIB BRCA Mutation Status: Absent Line of Therapy: Third Line  Intent of Therapy: Non-Curative / Palliative Intent, Not Discussed with Patient

## 2017-07-04 NOTE — Telephone Encounter (Signed)
Patient decline avs and calendar waiting on 5/28 approval

## 2017-07-06 ENCOUNTER — Ambulatory Visit: Payer: 59 | Admitting: Physical Therapy

## 2017-07-06 ENCOUNTER — Encounter: Payer: Self-pay | Admitting: Physical Therapy

## 2017-07-06 ENCOUNTER — Encounter: Payer: Self-pay | Admitting: Oncology

## 2017-07-06 DIAGNOSIS — M62838 Other muscle spasm: Secondary | ICD-10-CM | POA: Diagnosis not present

## 2017-07-06 DIAGNOSIS — M6281 Muscle weakness (generalized): Secondary | ICD-10-CM

## 2017-07-06 NOTE — Therapy (Signed)
[facility] [address], [place], [place], [address] Phone: [phone]   Fax:  [phone] Progress Note Reporting Period 04/08/2017 to 07/06/2017  See note below for Objective Data and Assessment of Progress/Goals.      Physical Therapy Treatment  Patient Details  Name: Kathryn Ramsey MRN: [id number] Date of Birth: Nov 10, 1965 Referring Provider: Dr. Lurline Del   Encounter Date: 07/06/2017  PT End of Session - 07/06/17 1409    Visit Number  10    Date for PT Re-Evaluation  10/06/17    Authorization Type  [employer]    Authorization - Visit Number  10    Authorization - Number of Visits  23    PT Start Time  1400    PT Stop Time  1440    PT Time Calculation (min)  40 min    Activity Tolerance  Patient tolerated treatment well    Behavior During Therapy  [facility] for tasks assessed/performed       Past Medical History:  Diagnosis Date  . Acute sensory neuropathy 06/26/2015  . Allergy   . Anemia   . Asthma    illness induced asthma  . Blood transfusion without reported diagnosis    at age 52 years old D/T surgery to femur being crushed  . 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 Heart murmur    pt, states had a "working heart murmur"  . History of kidney stones   . PONV (postoperative nausea and vomiting)   . Skin cancer     Past Surgical History:  Procedure Laterality Date  . 3 laporscopic proceedures    . ABDOMINAL HYSTERECTOMY     2010  . CHOLECYSTECTOMY     2010  . DILATION AND CURETTAGE OF UTERUS     1997  . LAPAROSCOPY N/A 03/27/2015   Procedure: DIAGNOSTIC LAPAROSCOPY ;  Surgeon: [name], MD;  Location: [facility];  Service: Gynecology;  Laterality: N/A;  . LAPAROSCOPY N/A 02/24/2016   Procedure: LAPAROSCOPY DIAGNOSTIC WITH PERITONEAL WASHINGS AND PERITONEAL BIOPSY;  Surgeon: [name], MD;  Location: [facility];  Service: Gynecology;   Laterality: N/A;  . LAPAROTOMY N/A 03/27/2015   Procedure: EXPLORATORY LAPAROTOMY, BILATERAL SALPINGO OOPHORECTOMY, OMENTECTOMY, RADICAL TUMOR DEBULKING;  Surgeon: [name], MD;  Location: [facility];  Service: Gynecology;  Laterality: N/A;  . sinus surgery 1994      There were no vitals filed for this visit.  Subjective Assessment - 07/06/17 1402    Subjective  I saw Dr. Jana Hakim due to numbers are rising. My markers are good indicators. I will start my next treatment next Tuesday. I have good and bad days.  I may have more discomfort with meat protein. I feel I have cancer in the pelvic floor area.     Patient Stated Goals  reduce abdominal discomfort    Currently in Pain?  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 Sarajane Jews Magrinat    Onset Date/Surgical Date  02/04/17    Prior Therapy  yes      Precautions   Precautions  Other (comment)    Precaution Comments  cancer      Restrictions   Weight Bearing Restrictions  No      [place] residence      Prior Function   Level of Independence  Independent    Leisure  yoga, orange theory      Cognition   Overall Cognitive Status  Within Functional Limits for tasks assessed      Observation/Other Assessments-Edema    Edema  Circumferential around abdomen at umbilicus prior to tx, 96.7 cm after 73.5      Sensation   Light Touch  Appears Intact on bilateral feet and  lower leg                Pelvic Floor Special Questions - 07/06/17 0001    Strength  good squeeze, good lift, able to hold agaisnt strong resistance        [facility] Adult PT Treatment/Exercise - 07/06/17 0001      Manual Therapy   Manual Therapy  Myofascial release;Soft tissue mobilization    Soft tissue mobilization  circular massage around the umbilicus    Myofascial Release  respiratory and urogenital diaphgram going through the 3 planes of fascia             PT Education - 07/06/17 1438    Education provided  Yes    Education Details  pelvic floor contraction in sitting    Person(s) Educated  Patient    Methods  Explanation;Demonstration;Verbal cues;Handout    Comprehension  Returned demonstration;Verbalized understanding       PT Short Term Goals - 05/12/17 1143      PT SHORT TERM GOAL #1   Title  independent with scar massage to improve tissue mobility    Time  4    Period  Weeks    Status  Achieved      PT SHORT TERM GOAL #2   Title  understand how to perform abdominal massage to promote peristalic mobility to improve constipation    Time  4    Period  Weeks    Status  Achieved        PT Long Term Goals - 07/06/17 1445      PT LONG TERM GOAL #1   Title  independent with HEP    Baseline  still learning    Time  4    Period  Months    Status  On-going      PT LONG TERM GOAL #2   Title  reduction of abdominal pain as she is standing due tp reduction of fascial tightness    Baseline  no change in tightness when standing and hve more pain    Time  12    Period  Weeks    Status  On-going      PT LONG TERM GOAL #3   Title  mobility of abdominal scars improved >/= 75% so patient is able to contract lower abdominals     Time  4  Period  Months    Status  Achieved      PT LONG TERM GOAL #4   Title  abdominal circumference is consistently 75 cm due to reduction of swelling    Time  12    Period  Weeks    Status  New    Target Date   10/06/17            Plan - 07/06/17 1442    Clinical Impression Statement  Patient is doing yoga and orange theory for execise several times per day for conditioning.  Patient abdomen went from 76.5 cm to 73.5 after manual work. Patient pelvic floor strength is 4/5.  Patient has some trigger points in the pelvic floor and restrictions around the urethra sphincter.  Patient is having an easier time with bowel movements due to abdominal massage.  Patient has fascial restrictions in the abdomen and pelvic floor where the manual work is decreasing the restrictions as she is going through chemotherapy and her markers rising.     Rehab Potential  Excellent    Clinical Impairments Affecting Rehab Potential  recurrent cancer with last episode 02/04/2017; presently taking chemotherapy    PT Frequency  2x / week    PT Duration  12 weeks    PT Treatment/Interventions  Therapeutic activities;Therapeutic exercise;Patient/family education;Neuromuscular re-education;Manual techniques;Scar mobilization;Dry needling    PT Next Visit Plan  work around the umbilicus in quadruped; possible internal release    PT Home Exercise Plan  progress as needed    Recommended Other Services  sent renewal note on 07/06/2017    Consulted and Agree with Plan of Care  Patient       Patient will benefit from skilled therapeutic intervention in order to improve the following deficits and impairments:  Pain, Increased fascial restricitons, Increased muscle spasms, Decreased activity tolerance  Visit Diagnosis: Other muscle spasm  Muscle weakness (generalized)     Problem List Patient Active Problem List   Diagnosis Date Noted  . Lichen sclerosus et atrophicus of the vulva 03/31/2016  . Abdominal wall mass of right lower quadrant 03/08/2016  . 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 Goals of care, counseling/discussion 02/27/2016  . Elevated CA-125 02/24/2016  . Vaginal dryness, menopausal 10/01/2015  .  Drug-induced peripheral neuropathy ([place]) 09/02/2015  . Leukopenia due to antineoplastic chemotherapy ([place]) 09/02/2015  . Chemotherapy-induced neuropathy ([place]) 08/19/2015  . Cellulitis 07/03/2015  . Acute sensory neuropathy 06/26/2015  . High risk medication use 06/21/2015  . Facial paresthesia 06/18/2015  . Genetic testing 06/16/2015  . Restless legs 05/14/2015  . Family history of breast cancer in female 04/29/2015  . Chemotherapy-induced nausea 04/22/2015  . Chemotherapy induced neutropenia ([place]) 04/22/2015  . 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 History of hysterectomy for benign disease 03/21/2015  . IBS (irritable bowel syndrome) 03/21/2015  . Dense breast tissue 03/21/2015  . History of cholecystectomy 03/21/2015  . 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 [place], [place], [address] Phone: [phone]   Fax:  [phone]  Name: Blu Mcglaun MRN: [id number] Date of Birth: October 09, 1965

## 2017-07-06 NOTE — Patient Instructions (Addendum)
Quick Contraction: Gravity Resisted (Sitting)   Sitting, quickly squeeze then fully relax pelvic floor. Perform _1__ sets of _5__. Rest for _1__ seconds between sets. Do _3__ times a day.  Copyright  VHI. All rights reserved.  Slow Contraction: Gravity Resisted (Sitting)   Sitting, slowly squeeze pelvic floor for _10__ seconds. Rest for _5__ seconds. Repeat _5__ times. Do _3__ times a day.  Copyright  VHI. All rights reserved.  [facility] [address], [address] [place], [place] [address] Phone # [phone] Fax [phone]  

## 2017-07-12 ENCOUNTER — Inpatient Hospital Stay: Payer: 59

## 2017-07-12 ENCOUNTER — Other Ambulatory Visit: Payer: 59

## 2017-07-12 VITALS — BP 115/73 | HR 65 | Temp 98.8°F | Resp 18

## 2017-07-12 DIAGNOSIS — C786 Secondary malignant neoplasm of retroperitoneum and peritoneum: Secondary | ICD-10-CM

## 2017-07-12 DIAGNOSIS — C801 Malignant (primary) neoplasm, unspecified: Principal | ICD-10-CM

## 2017-07-12 DIAGNOSIS — C762 Malignant neoplasm of abdomen: Secondary | ICD-10-CM

## 2017-07-12 DIAGNOSIS — C5701 Malignant neoplasm of right fallopian tube: Secondary | ICD-10-CM

## 2017-07-12 DIAGNOSIS — Z7189 Other specified counseling: Secondary | ICD-10-CM

## 2017-07-12 DIAGNOSIS — Z5111 Encounter for antineoplastic chemotherapy: Secondary | ICD-10-CM | POA: Diagnosis not present

## 2017-07-12 LAB — CBC WITH DIFFERENTIAL/PLATELET
Basophils Absolute: 0 10*3/uL (ref 0.0–0.1)
Basophils Relative: 0 %
Eosinophils Absolute: 0 10*3/uL (ref 0.0–0.5)
Eosinophils Relative: 0 %
HCT: 34.7 % — ABNORMAL LOW (ref 34.8–46.6)
Hemoglobin: 11.7 g/dL (ref 11.6–15.9)
Lymphocytes Relative: 29 %
Lymphs Abs: 1.5 10*3/uL (ref 0.9–3.3)
MCH: 33.9 pg (ref 25.1–34.0)
MCHC: 33.9 g/dL (ref 31.5–36.0)
MCV: 100.2 fL (ref 79.5–101.0)
Monocytes Absolute: 0.5 10*3/uL (ref 0.1–0.9)
Monocytes Relative: 9 %
Neutro Abs: 3.2 10*3/uL (ref 1.5–6.5)
Neutrophils Relative %: 62 %
Platelets: 342 10*3/uL (ref 145–400)
RBC: 3.46 MIL/uL — ABNORMAL LOW (ref 3.70–5.45)
RDW: 21.1 % — ABNORMAL HIGH (ref 11.2–14.5)
WBC: 5.3 10*3/uL (ref 3.9–10.3)

## 2017-07-12 LAB — COMPREHENSIVE METABOLIC PANEL
ALT: 45 U/L (ref 0–55)
AST: 32 U/L (ref 5–34)
Albumin: 4.2 g/dL (ref 3.5–5.0)
Alkaline Phosphatase: 94 U/L (ref 40–150)
Anion gap: 10 (ref 3–11)
BUN: 13 mg/dL (ref 7–26)
CO2: 24 mmol/L (ref 22–29)
Calcium: 9.1 mg/dL (ref 8.4–10.4)
Chloride: 105 mmol/L (ref 98–109)
Creatinine, Ser: 0.78 mg/dL (ref 0.60–1.10)
GFR calc Af Amer: >60 mL/min (ref >60)
GFR calc non Af Amer: >60 mL/min (ref >60)
Glucose, Bld: 101 mg/dL (ref 70–140)
Potassium: 4.1 mmol/L (ref 3.5–5.1)
Sodium: 139 mmol/L (ref 136–145)
Total Bilirubin: 0.3 mg/dL (ref 0.2–1.2)
Total Protein: 7.4 g/dL (ref 6.4–8.3)

## 2017-07-12 LAB — TOTAL PROTEIN, URINE DIPSTICK: Protein, ur: NEGATIVE mg/dL

## 2017-07-12 MED ORDER — PROCHLORPERAZINE MALEATE 10 MG PO TABS
10.0000 mg | ORAL_TABLET | Freq: Once | ORAL | Status: AC
  Administered 2017-07-12: 10 mg via ORAL

## 2017-07-12 MED ORDER — PACLITAXEL PROTEIN-BOUND CHEMO INJECTION 100 MG
100.0000 mg/m2 | Freq: Once | Status: AC
  Administered 2017-07-12: 150 mg via INTRAVENOUS
  Filled 2017-07-12: qty 30

## 2017-07-12 MED ORDER — SODIUM CHLORIDE 0.9 % IV SOLN
Freq: Once | INTRAVENOUS | Status: AC
  Administered 2017-07-12: 15:00:00 via INTRAVENOUS

## 2017-07-12 MED ORDER — PROCHLORPERAZINE MALEATE 10 MG PO TABS
ORAL_TABLET | ORAL | Status: AC
  Filled 2017-07-12: qty 1

## 2017-07-12 NOTE — Patient Instructions (Signed)
[facility] Discharge Instructions for Patients Receiving Chemotherapy  Today you received the following chemotherapy agents Abraxane.   To help prevent nausea and vomiting after your treatment, we encourage you to take your nausea medication.   If you develop nausea and vomiting that is not controlled by your nausea medication, call the clinic.   BELOW ARE SYMPTOMS THAT SHOULD BE REPORTED IMMEDIATELY:  *FEVER GREATER THAN 100.5 F  *CHILLS WITH OR WITHOUT FEVER  NAUSEA AND VOMITING THAT IS NOT CONTROLLED WITH YOUR NAUSEA MEDICATION  *UNUSUAL SHORTNESS OF BREATH  *UNUSUAL BRUISING OR BLEEDING  TENDERNESS IN MOUTH AND THROAT WITH OR WITHOUT PRESENCE OF ULCERS  *URINARY PROBLEMS  *BOWEL PROBLEMS  UNUSUAL RASH Items with * indicate a potential emergency and should be followed up as soon as possible.  Feel free to call the clinic should you have any questions or concerns. The clinic phone number is [phone].  Please show the CHEMO ALERT CARD at check-in to the Emergency Department and triage nurse.   Nanoparticle Albumin-Bound Paclitaxel injection What is this medicine? NANOPARTICLE ALBUMIN-BOUND PACLITAXEL (Na no PAHR ti kuhl al BYOO muhn-bound PAK li TAX el) is a chemotherapy drug. It targets fast dividing cells, like cancer cells, and causes these cells to die. This medicine is used to treat advanced breast cancer and advanced lung cancer. This medicine may be used for other purposes; ask your health care provider or pharmacist if you have questions. COMMON BRAND NAME(S): Abraxane What should I tell my health care provider before I take this medicine? They need to know if you have any of these conditions: -kidney disease -liver disease -low blood counts, like low platelets, red blood cells, or white blood cells -recent or ongoing radiation therapy -an unusual or allergic reaction to paclitaxel, albumin, other chemotherapy, other medicines, foods, dyes,  or preservatives -pregnant or trying to get pregnant -breast-feeding How should I use this medicine? This drug is given as an infusion into a vein. It is administered in a hospital or clinic by a specially trained health care professional. Talk to your pediatrician regarding the use of this medicine in children. Special care may be needed. Overdosage: If you think you have taken too much of this medicine contact a poison control center or emergency room at once. NOTE: This medicine is only for you. Do not share this medicine with others. What if I miss a dose? It is important not to miss your dose. Call your doctor or health care professional if you are unable to keep an appointment. What may interact with this medicine? -cyclosporine -diazepam -ketoconazole -medicines to increase blood counts like filgrastim, pegfilgrastim, sargramostim -other chemotherapy drugs like cisplatin, doxorubicin, epirubicin, etoposide, teniposide, vincristine -quinidine -testosterone -vaccines -verapamil Talk to your doctor or health care professional before taking any of these medicines: -acetaminophen -aspirin -ibuprofen -ketoprofen -naproxen This list may not describe all possible interactions. Give your health care provider a list of all the medicines, herbs, non-prescription drugs, or dietary supplements you use. Also tell them if you smoke, drink alcohol, or use illegal drugs. Some items may interact with your medicine. What should I watch for while using this medicine? Your condition will be monitored carefully while you are receiving this medicine. You will need important blood work done while you are taking this medicine. This medicine can cause serious allergic reactions. If you experience allergic reactions like skin rash, itching or hives, swelling of the face, lips, or tongue, tell your doctor or health care professional   away. In some cases, you may be given additional medicines to help with  side effects. Follow all directions for their use. This drug may make you feel generally unwell. This is not uncommon, as chemotherapy can affect healthy cells as well as cancer cells. Report any side effects. Continue your course of treatment even though you feel ill unless your doctor tells you to stop. Call your doctor or health care professional for advice if you get a fever, chills or sore throat, or other symptoms of a cold or flu. Do not treat yourself. This drug decreases your body's ability to fight infections. Try to avoid being around people who are sick. This medicine may increase your risk to bruise or bleed. Call your doctor or health care professional if you notice any unusual bleeding. Be careful brushing and flossing your teeth or using a toothpick because you may get an infection or bleed more easily. If you have any dental work done, tell your dentist you are receiving this medicine. Avoid taking products that contain aspirin, acetaminophen, ibuprofen, naproxen, or ketoprofen unless instructed by your doctor. These medicines may hide a fever. Do not become pregnant while taking this medicine. Women should inform their doctor if they wish to become pregnant or think they might be pregnant. There is a potential for serious side effects to an unborn child. Talk to your health care professional or pharmacist for more information. Do not breast-feed an infant while taking this medicine. Men are advised not to father a child while receiving this medicine. What side effects may I notice from receiving this medicine? Side effects that you should report to your doctor or health care professional as soon as possible: -allergic reactions like skin rash, itching or hives, swelling of the face, lips, or tongue -low blood counts - This drug may decrease the number of white blood cells, red blood cells and platelets. You may be at increased risk for infections and bleeding. -signs of infection -  fever or chills, cough, sore throat, pain or difficulty passing urine -signs of decreased platelets or bleeding - bruising, pinpoint red spots on the skin, black, tarry stools, nosebleeds -signs of decreased red blood cells - unusually weak or tired, fainting spells, lightheadedness -breathing problems -changes in vision -chest pain -high or low blood pressure -mouth sores -nausea and vomiting -pain, swelling, redness or irritation at the injection site -pain, tingling, numbness in the hands or feet -slow or irregular heartbeat -swelling of the ankle, feet, hands Side effects that usually do not require medical attention (report to your doctor or health care professional if they continue or are bothersome): -aches, pains -changes in the color of fingernails -diarrhea -hair loss -loss of appetite This list may not describe all possible side effects. Call your doctor for medical advice about side effects. You may report side effects to FDA at 1-800-FDA-1088. Where should I keep my medicine? This drug is given in a hospital or clinic and will not be stored at home. NOTE: This sheet is a summary. It may not cover all possible information. If you have questions about this medicine, talk to your doctor, pharmacist, or health care provider.  2018 Elsevier/Gold Standard (2014-12-04 10:05:20)

## 2017-07-13 ENCOUNTER — Encounter: Payer: 59 | Admitting: Physical Therapy

## 2017-07-13 LAB — CA 125: Cancer Antigen (CA) 125: 655.9 U/mL — ABNORMAL HIGH (ref 0.0–38.1)

## 2017-07-14 ENCOUNTER — Encounter (HOSPITAL_COMMUNITY): Payer: Self-pay | Admitting: Gynecologic Oncology

## 2017-07-15 NOTE — Progress Notes (Signed)
[facility]  Telephone:([phone]) [phone] Fax:(336) [phone]     ID: Kathryn Ramsey DOB: 25-Oct-1965  MR#: [id number]  TOI#:[id number]  Patient Care Team: Harlan Stains, MD as PCP - General (Family Medicine) [name], MD as Consulting Physician (Obstetrics and Gynecology) Juanita Craver, MD as Consulting Physician (Gastroenterology) Servando Salina, MD as Consulting Physician (Obstetrics and Gynecology) Nickie Retort, MD as Consulting Physician (Urology) Gillis Ends, MD as Referring Physician (Obstetrics and Gynecology) OTHER MD: Festus Aloe [phone])  CHIEF COMPLAINT: Recurrent ovarian cancer  CURRENT TREATMENT: Abraxane  INTERVAL HISTORY: Kathryn Ramsey returns today for a follow-up and treatment of her recurrent ovarian cancer accompanied by her husband. She is currently receiving weekly Abraxane, with a dose due today.  This is day 8 cycle 1 of 3 planned cycles, consisting of weekly paclitaxel x3 with 1 week off, repeated every 28 days.  She tolerated her first dose last week well overall. She felt a wave of nausea on days 3-4. She did not take any nausea medication for this, and she denies vomiting. If she has nausea, she tries eating and this usually helps. She complains of numbness in her right big toe that mostly occurs at night. She also has increased back pain.  On the only hand she did a lot of carrying and moving things this past weekend.  She feels prickling sensations in both shins at times.  This comes and goes. She denies oral sores, constipation, diarrhea, fever, or rash.    REVIEW OF SYSTEMS: Kathryn Ramsey reports that she was busy over the weekend. She and her husband were moving things, but she didn't do any heavy lifting. She felt some shooting "electric shocks" in her abdomen, but this wasn't painful. She is still exercising and going to yoga classes, but she feels that her body is weaker than it sed to be. She denies unusual headaches, visual  changes, nausea, vomiting, or dizziness. There has been no unusual cough, phlegm production, or pleurisy. This been no change in bowel or bladder habits. She denies unexplained fatigue or unexplained weight loss, bleeding, rash, or fever. A detailed review of systems was otherwise stable.    HISTORY OF PRESENT ILLNESS: From Dr. Serita Grit original intake note 03/17/2015:  "Kathryn Ramsey is a very pleasant G4P4 who is seen in consultation at the request of Dr Collene Mares and Dr Garwin Brothers for peritoneal carcinomatosis. The patient has a history of a workup by urology for gross hematuria which included a pelvic examine was suggested for extrinsic mass effect on the bladder on cystoscopy. Cystoscopy took place on in December 2016. The patient denies abdominal pain bloating early satiety or abdominal distention.  On 08/03/2015 at [facility] she underwent a CT scan of the abdomen and pelvis as ordered by Dr Baruch Gouty. This revealed a 1.4 cm low attenuation lesion on the posterior right hepatic lobe suggestive of a hemangioma. Status post hysterectomy. No ovarian masses. Moderate ascites. Omental caking seen in the lateral left abdomen and pelvis. Peritoneal nodularity in the left lateral pelvic cul-de-sac. No gross extrinsic compression on the bladder was identified on imaging.  The patient was then seen by her gastroenterologist, Dr. Collene Mares, who performed a colonoscopy which was unremarkable.  Tumor markers were drawn on 08/04/2015 and these included a CA-125 that was elevated to 957, and a CEA that was normal at 1."  On 03/27/2015 the patient underwent diagnostic laparoscopy, exploratory laparotomy with bilateral salpingo-oophorectomy, omentectomy, and radical tumor debulking with optimal cytoreduction (R0) of what proved to be  a stage IIIc right fallopian tube cancer. She was treated adjuvantly with carboplatin and paclitaxel, as detailed below. Her CA 125 dropped from 1226 on 03/20/2015 to 26.1 by  06/16/2015.  Her subsequent history is as detailed below.  I PAST MEDICAL HISTORY: Past Medical History:  Diagnosis Date  . Acute sensory neuropathy 06/26/2015  . Allergy   . Anemia   . Asthma    illness induced asthma  . Blood transfusion without reported diagnosis    at age 50 years old D/T surgery to femur being crushed  . 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 Heart murmur    pt, states had a "working heart murmur"  . History of kidney stones   . PONV (postoperative nausea and vomiting)   . Skin cancer     PAST SURGICAL HISTORY: Past Surgical History:  Procedure Laterality Date  . 3 laporscopic proceedures    . ABDOMINAL HYSTERECTOMY     2010  . CHOLECYSTECTOMY     2010  . DILATION AND CURETTAGE OF UTERUS     1997  . LAPAROSCOPY N/A 03/27/2015   Procedure: DIAGNOSTIC LAPAROSCOPY ;  Surgeon: [name], MD;  Location: [facility];  Service: Gynecology;  Laterality: N/A;  . LAPAROSCOPY N/A 02/24/2016   Procedure: LAPAROSCOPY DIAGNOSTIC WITH PERITONEAL WASHINGS AND PERITONEAL BIOPSY;  Surgeon: [name], MD;  Location: [facility];  Service: Gynecology;  Laterality: N/A;  . LAPAROTOMY N/A 03/27/2015   Procedure: EXPLORATORY LAPAROTOMY, BILATERAL SALPINGO OOPHORECTOMY, OMENTECTOMY, RADICAL TUMOR DEBULKING;  Surgeon: [name], MD;  Location: [facility];  Service: Gynecology;  Laterality: N/A;  . sinus surgery 1994      FAMILY HISTORY Family History  Problem Relation Age of Onset  . Hypertension Father   . Skin cancer Father        nonmelanoma skin cancers in his late 88s  . Non-Hodgkin's lymphoma Maternal Aunt        dx. 85s; smoker  . Stroke Maternal Grandmother   . Other Mother        benign meningioma dx. early-mid-70s; hysterectomy in her late 106s for heavy periods - still has ovaries  . Other Son        one son with pre-cancerous skin findings  . Other Daughter        cysts on ovaries and hx of  heavy periods  . Other Sister        hysterectomy for cysts - still has ovaries  . Heart attack Maternal Grandfather   . Heart attack Paternal Grandfather   . Renal cancer Maternal Aunt 60       smoker  . Breast cancer Maternal Aunt 78  . Other Maternal Aunt        dx. benign brain tumor (meningioma) at 54; 2nd benign brain tumor in her late 43s; hx of radical hysterectomy at age 67  . Brain cancer Other        NOS tumor  The patient's parents are both living, in their late 3s as of January 2018. The patient has one brother, 3 sisters. There is no history of ovarian cancer in the family. One maternal aunt had breast and kidney cancer, another had non-Hodgkin's lymphoma.  GYNECOLOGIC HISTORY:  No LMP recorded. Patient has had a hysterectomy. 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 Her husband [name] is a Engineer, maintenance (IT). Their children are 31, 99, 71 and 41 y/o as of JAN 2018. The oldest works as an Electrical engineer in the [employer] in [place]. The other 3 children live in [place], the youngest currently attending [employer]. The patient has no grandchildren. She attends [employer]    ADVANCED DIRECTIVES:    HEALTH MAINTENANCE: Social History   Tobacco Use  . Smoking status: Never Smoker  . Smokeless tobacco: Never Used  Substance Use Topics  . Alcohol use: Yes    Comment:  3 glasses wine or beer/week  . Drug use: No     Colonoscopy:2016  PAP: s/p hyst  Bone density:   Allergies  Allergen Reactions  . Bee Venom Shortness Of Breath    swelling  . Sulfa Antibiotics Shortness Of Breath    Vomit , diarrhea , hives  . Iodinated Diagnostic Agents Other (See Comments)    01/27/17 Pt reports sneezing and congestion. Pt states she takes claritin as a premed for CT scan in [place]. Premedicated with Benadryl 25 mg PO. Post CT,no sneezing but had mild drainage/congestion which  resolved after a few minutes with oral fluids.    Current Outpatient Medications  Medication Sig Dispense Refill  . gabapentin (NEURONTIN) 100 MG capsule Take 1 capsule (100 mg total) by mouth at bedtime. Takes 110m at bedtime. Takes an additional 103mduring the day if needed for nerve pain. (Patient not taking: Reported on 04/20/2017) 90 capsule 4  . loratadine (CLARITIN) 10 MG tablet Take 10 mg by mouth daily.    . prochlorperazine (COMPAZINE) 10 MG tablet Take 1 tablet (10 mg total) by mouth every 6 (six) hours as needed (Nausea or vomiting). 30 tablet 1  . valACYclovir (VALTREX) 500 MG tablet Take 1 tablet (500 mg total) by mouth daily. (Patient not taking: Reported on 05/27/2017) 90 tablet 4   No current facility-administered medications for this visit.    Facility-Administered Medications Ordered in Other Visits  Medication Dose Route Frequency Provider Last Rate Last Dose  . heparin lock flush 100 unit/mL  500 Units Intracatheter Once PRN Taite Baldassari, GuVirgie DadMD      . PACLitaxel-protein bound (ABRAXANE) chemo infusion 150 mg  100 mg/m2 (Treatment Plan Recorded) Intravenous Once [name] Donner, GuVirgie DadMD      . sodium chloride flush (NS) 0.9 % injection 10 mL  10 mL Intracatheter PRN Fines Kimberlin, GuVirgie DadMD        OBJECTIVE: Middle-aged white woman in no acute distress  There were no vitals filed for this visit.   There is no height or weight on file to calculate BMI.   For today's vitals please consult the chemotherapy follow-up sheet   ECOG FS:1 - Symptomatic but completely ambulatory  Sclerae unicteric, pupils round and equal Oropharynx clear and moist No cervical or supraclavicular adenopathy Lungs no rales or rhonchi Heart regular rate and rhythm Abd soft, nontender, positive bowel sounds MSK no focal spinal tenderness, no upper extremity lymphedema Neuro: nonfocal, well oriented, appropriate affect Breasts: Deferred  LAB RESULTS:  CMP     Component Value Date/Time   NA  135 (L) 07/19/2017 1206   NA 140 02/11/2017 0755   K 3.9 07/19/2017 1206   K 4.4 02/11/2017 0755   CL 101 07/19/2017 1206   CO2 25 07/19/2017 1206   CO2 26 02/11/2017 0755   GLUCOSE 75 07/19/2017 1206   GLUCOSE 93 02/11/2017 0755   BUN 10 07/19/2017 1206  BUN 7.9 02/11/2017 0755   CREATININE 0.69 07/19/2017 1206   CREATININE 0.72 03/25/2017 0832   CREATININE 0.7 02/11/2017 0755   CALCIUM 9.2 07/19/2017 1206   CALCIUM 9.6 02/11/2017 0755   PROT 7.5 07/19/2017 1206   PROT 7.5 02/11/2017 0755   ALBUMIN 4.3 07/19/2017 1206   ALBUMIN 4.4 02/11/2017 0755   AST 30 07/19/2017 1206   AST 74 (H) 03/25/2017 0832   AST 27 02/11/2017 0755   ALT 30 07/19/2017 1206   ALT 155 (H) 03/25/2017 0832   ALT 24 02/11/2017 0755   ALKPHOS 76 07/19/2017 1206   ALKPHOS 58 02/11/2017 0755   BILITOT 0.5 07/19/2017 1206   BILITOT 0.4 03/25/2017 0832   BILITOT 0.43 02/11/2017 0755   GFRNONAA >60 07/19/2017 1206   GFRNONAA >60 03/25/2017 0832   GFRAA >60 07/19/2017 1206   GFRAA >60 03/25/2017 0832    INo results found for: SPEP, UPEP  Lab Results  Component Value Date   WBC 3.6 (L) 07/19/2017   NEUTROABS 1.8 07/19/2017   HGB 10.4 (L) 07/19/2017   HCT 31.2 (L) 07/19/2017   MCV 100.0 07/19/2017   PLT 245 07/19/2017      Chemistry      Component Value Date/Time   NA 135 (L) 07/19/2017 1206   NA 140 02/11/2017 0755   K 3.9 07/19/2017 1206   K 4.4 02/11/2017 0755   CL 101 07/19/2017 1206   CO2 25 07/19/2017 1206   CO2 26 02/11/2017 0755   BUN 10 07/19/2017 1206   BUN 7.9 02/11/2017 0755   CREATININE 0.69 07/19/2017 1206   CREATININE 0.72 03/25/2017 0832   CREATININE 0.7 02/11/2017 0755      Component Value Date/Time   CALCIUM 9.2 07/19/2017 1206   CALCIUM 9.6 02/11/2017 0755   ALKPHOS 76 07/19/2017 1206   ALKPHOS 58 02/11/2017 0755   AST 30 07/19/2017 1206   AST 74 (H) 03/25/2017 0832   AST 27 02/11/2017 0755   ALT 30 07/19/2017 1206   ALT 155 (H) 03/25/2017 0832   ALT 24  02/11/2017 0755   BILITOT 0.5 07/19/2017 1206   BILITOT 0.4 03/25/2017 0832   BILITOT 0.43 02/11/2017 0755       No results found for: LABCA2  No components found for: LABCA125  No results for input(s): INR in the last 168 hours.  Urinalysis    Component Value Date/Time   COLORURINE STRAW (A) 04/08/2017 1123   APPEARANCEUR CLEAR 04/08/2017 1123   LABSPEC 1.003 (L) 04/08/2017 1123   LABSPEC 1.005 10/09/2015 1239   PHURINE 8.0 04/08/2017 1123   GLUCOSEU NEGATIVE 04/08/2017 1123   GLUCOSEU Negative 10/09/2015 1239   HGBUR NEGATIVE 04/08/2017 1123   BILIRUBINUR NEGATIVE 04/08/2017 1123   BILIRUBINUR Negative 10/09/2015 1239   KETONESUR NEGATIVE 04/08/2017 1123   PROTEINUR NEGATIVE 07/12/2017 1345   UROBILINOGEN 0.2 10/09/2015 1239   NITRITE NEGATIVE 04/08/2017 1123   LEUKOCYTESUR NEGATIVE 04/08/2017 1123   LEUKOCYTESUR Negative 10/09/2015 1239   CA 125 was 411 2 months ago has steadily risen to the current 609.9  STUDIES:  Mr Pelvis W Wo Contrast  Result Date: 06/23/2017 CLINICAL DATA:  Right fallopian tube carcinoma. Peritoneal carcinomatosis. Rising tumor markers and worsening pelvic pressure. EXAM: MRI PELVIS WITHOUT AND WITH CONTRAST TECHNIQUE: Multiplanar multisequence MR imaging of the pelvis was performed both before and after administration of intravenous contrast. CONTRAST:  72m MULTIHANCE GADOBENATE DIMEGLUMINE 529 MG/ML IV SOLN COMPARISON:  CT on 05/26/2017 FINDINGS: Urinary Tract: Unremarkable urinary bladder. Bowel:  Unremarkable pelvic bowel loops. Vascular/Lymphatic: No pathologically enlarged lymph nodes or other significant abnormality. Reproductive: Uterus: Previous hysterectomy.  Normal appearance of vaginal cuff. Right ovary: Surgically absent.  No adnexal mass identified. Left ovary:  Surgically absent.  No adnexal mass identified. Other: No abnormal peritoneal soft tissue density or enhancement. No ascites. Musculoskeletal:  Unremarkable. IMPRESSION: No  evidence of recurrent or metastatic carcinoma within the pelvis. No other acute findings or significant abnormality. Electronically Signed   By: Earle Gell M.D.   On: 06/23/2017 09:51     ELIGIBLE FOR AVAILABLE RESEARCH PROTOCOL:  discussing protocols at [facility]: 52 y.o. BRCA negative [place] woman status post radical tumor debulking with optimal cytoreduction (R0) 03/27/2015 for a stage IIIC, high-grade right fallopian tube carcinoma  (a) baseline CA-125 was1226.  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)  (c) tumor is PD-L1 negative (02/24/2016)  (d) tumor is strongly estrogen receptor positive, progesterone receptor negative (02/24/2016)  (1) adjuvant chemotherapy consisted of carboplatin and paclitaxel for 6 doses, begun 04/14/2015, completed 08/11/2015  (a) paclitaxel was omitted from cycle 5 and dose reduced on cycle 6 because of neuropathy   (b) a "make up" dose of paclitaxel was given 08/25/2015  (c) last carboplatin dose was 08/11/2015  (d) CA-125 normalized by 06/16/2015   (2) FIRST RECURRENCE: January 2018  (a) CA-125 rise beginning November 2017 led to CT scans and PET scans December 2017, all negative  (b) exploratory laparotomy 02/24/2016 showed pathologically confirmed recurrence, with miliary disease involving all examined surfaces  (c) port-site metastases noted 03/08/2016  (3) carboplatin/liposomal doxorubicin started 03/05/2016, repeated every three weeks x4, completed 06/04/2016.  (a) cycle 3 delayed one week because of neutropenia; OnPro added  (b) CA 125 normalized after cycle 3  (4) RUCAPARIB maintenance started 07/02/2016  (a) restaging 10/01/2016: Normal CA 125, negative CT of the abdomen and pelvis  (b) labs 11/24/2016 shows a rise in her CA 125-43.4, with  continuing rise thereafter  (c) rucaparib discontinued October 2018 with rising CA 125  (5) anastrozole started December 23, 2016-stopped after a couple of weeks  SECOND RECURRENCE: (6) Gemcitabine/Bevacizumab started on 02/04/2017  (a) gemcitabine omitted cycle 4 because of intercurrent infection  (b) gemcitabine resumed with cycle 5, with intervening rise in  Ca 125  (c) repeat CT scan of the abdomen and pelvis on 05/26/2017 does not confirm obvious disease progression  (d) cycle 5 of gemcitabine/bevacizumab delayed 1 week   (e) gemcitabine/ bevacizumab discontinued after 6 cycles, with rising CA 125 (last dose 06/24/2017)  (7) foundation one testing in process  (8) Abraxane to start 07/12/2017, to be repeated weekly as tolerated  PLAN Eular tolerated her first dose of Abraxane without any unusual side effects.  I have encouraged her to be aggressive if she develops nausea and go ahead and take nausea medication so she does not train herself to become nauseated and vomit.  I think she has some chronic problem in the right big toe but she has no evidence of neuropathy in any of the other toes or fingers.  At this point I think we can safely continue the Abraxane but as she will be treated some days in which there is no visit I have asked her if she has the slightest question regarding progressive neuropathy to not let herself be tested until she sees me or  1 of my partners.  She will return next week for her third dose, day 15 cycle 1, and then she will be off treatment until 08/09/2017, when I will see her again at the beginning of her second cycle  She knows to call for any other issues that may develop before the next visit.     Kathryn Ramsey, Virgie Dad, MD  07/19/17 2:06 PM Medical Oncology and Hematology [facility] [address] [place], [place] [address] Tel. [phone]    Fax. [phone]  Alice Rieger, am acting as scribe for Chauncey Cruel MD.  I,  Lurline Del MD, have reviewed the above documentation for accuracy and completeness, and I agree with the above.  \

## 2017-07-19 ENCOUNTER — Inpatient Hospital Stay: Payer: 59

## 2017-07-19 ENCOUNTER — Inpatient Hospital Stay: Payer: 59 | Attending: Oncology

## 2017-07-19 ENCOUNTER — Inpatient Hospital Stay (HOSPITAL_BASED_OUTPATIENT_CLINIC_OR_DEPARTMENT_OTHER): Payer: 59 | Admitting: Oncology

## 2017-07-19 VITALS — BP 113/74 | HR 61 | Temp 98.2°F

## 2017-07-19 DIAGNOSIS — Z79899 Other long term (current) drug therapy: Secondary | ICD-10-CM | POA: Insufficient documentation

## 2017-07-19 DIAGNOSIS — C786 Secondary malignant neoplasm of retroperitoneum and peritoneum: Secondary | ICD-10-CM | POA: Diagnosis not present

## 2017-07-19 DIAGNOSIS — R530 Neoplastic (malignant) related fatigue: Secondary | ICD-10-CM | POA: Diagnosis not present

## 2017-07-19 DIAGNOSIS — Z8051 Family history of malignant neoplasm of kidney: Secondary | ICD-10-CM | POA: Insufficient documentation

## 2017-07-19 DIAGNOSIS — Z7189 Other specified counseling: Secondary | ICD-10-CM

## 2017-07-19 DIAGNOSIS — G629 Polyneuropathy, unspecified: Secondary | ICD-10-CM

## 2017-07-19 DIAGNOSIS — Z9221 Personal history of antineoplastic chemotherapy: Secondary | ICD-10-CM

## 2017-07-19 DIAGNOSIS — Z5189 Encounter for other specified aftercare: Secondary | ICD-10-CM | POA: Diagnosis present

## 2017-07-19 DIAGNOSIS — Z5111 Encounter for antineoplastic chemotherapy: Secondary | ICD-10-CM | POA: Diagnosis not present

## 2017-07-19 DIAGNOSIS — C5701 Malignant neoplasm of right fallopian tube: Secondary | ICD-10-CM | POA: Insufficient documentation

## 2017-07-19 DIAGNOSIS — Z5112 Encounter for antineoplastic immunotherapy: Secondary | ICD-10-CM | POA: Diagnosis present

## 2017-07-19 DIAGNOSIS — Z170 Estrogen receptor positive status [ER+]: Secondary | ICD-10-CM

## 2017-07-19 DIAGNOSIS — C801 Malignant (primary) neoplasm, unspecified: Secondary | ICD-10-CM

## 2017-07-19 DIAGNOSIS — D701 Agranulocytosis secondary to cancer chemotherapy: Secondary | ICD-10-CM | POA: Diagnosis not present

## 2017-07-19 DIAGNOSIS — C762 Malignant neoplasm of abdomen: Secondary | ICD-10-CM

## 2017-07-19 LAB — COMPREHENSIVE METABOLIC PANEL
ALT: 30 U/L (ref 0–55)
AST: 30 U/L (ref 5–34)
Albumin: 4.3 g/dL (ref 3.5–5.0)
Alkaline Phosphatase: 76 U/L (ref 40–150)
Anion gap: 9 (ref 3–11)
BUN: 10 mg/dL (ref 7–26)
CO2: 25 mmol/L (ref 22–29)
Calcium: 9.2 mg/dL (ref 8.4–10.4)
Chloride: 101 mmol/L (ref 98–109)
Creatinine, Ser: 0.69 mg/dL (ref 0.60–1.10)
GFR calc Af Amer: >60 mL/min (ref >60)
GFR calc non Af Amer: >60 mL/min (ref >60)
Glucose, Bld: 75 mg/dL (ref 70–140)
Potassium: 3.9 mmol/L (ref 3.5–5.1)
Sodium: 135 mmol/L — ABNORMAL LOW (ref 136–145)
Total Bilirubin: 0.5 mg/dL (ref 0.2–1.2)
Total Protein: 7.5 g/dL (ref 6.4–8.3)

## 2017-07-19 LAB — CBC WITH DIFFERENTIAL/PLATELET
Basophils Absolute: 0 10*3/uL (ref 0.0–0.1)
Basophils Relative: 0 %
Eosinophils Absolute: 0 10*3/uL (ref 0.0–0.5)
Eosinophils Relative: 1 %
HCT: 31.2 % — ABNORMAL LOW (ref 34.8–46.6)
Hemoglobin: 10.4 g/dL — ABNORMAL LOW (ref 11.6–15.9)
Lymphocytes Relative: 45 %
Lymphs Abs: 1.6 10*3/uL (ref 0.9–3.3)
MCH: 33.3 pg (ref 25.1–34.0)
MCHC: 33.3 g/dL (ref 31.5–36.0)
MCV: 100 fL (ref 79.5–101.0)
Monocytes Absolute: 0.2 10*3/uL (ref 0.1–0.9)
Monocytes Relative: 4 %
Neutro Abs: 1.8 10*3/uL (ref 1.5–6.5)
Neutrophils Relative %: 50 %
Platelets: 245 10*3/uL (ref 145–400)
RBC: 3.12 MIL/uL — ABNORMAL LOW (ref 3.70–5.45)
RDW: 18.1 % — ABNORMAL HIGH (ref 11.2–14.5)
WBC: 3.6 10*3/uL — ABNORMAL LOW (ref 3.9–10.3)

## 2017-07-19 MED ORDER — HEPARIN SOD (PORK) LOCK FLUSH 100 UNIT/ML IV SOLN
500.0000 [IU] | Freq: Once | INTRAVENOUS | Status: DC | PRN
  Filled 2017-07-19: qty 5

## 2017-07-19 MED ORDER — SODIUM CHLORIDE 0.9 % IV SOLN
Freq: Once | INTRAVENOUS | Status: AC
  Administered 2017-07-19: 13:00:00 via INTRAVENOUS

## 2017-07-19 MED ORDER — PACLITAXEL PROTEIN-BOUND CHEMO INJECTION 100 MG
100.0000 mg/m2 | Freq: Once | Status: AC
  Administered 2017-07-19: 150 mg via INTRAVENOUS
  Filled 2017-07-19: qty 30

## 2017-07-19 MED ORDER — PROCHLORPERAZINE MALEATE 10 MG PO TABS
ORAL_TABLET | ORAL | Status: AC
  Filled 2017-07-19: qty 1

## 2017-07-19 MED ORDER — PROCHLORPERAZINE MALEATE 10 MG PO TABS
10.0000 mg | ORAL_TABLET | Freq: Once | ORAL | Status: AC
  Administered 2017-07-19: 10 mg via ORAL

## 2017-07-19 MED ORDER — SODIUM CHLORIDE 0.9% FLUSH
10.0000 mL | INTRAVENOUS | Status: DC | PRN
  Filled 2017-07-19: qty 10

## 2017-07-19 NOTE — Patient Instructions (Signed)
[facility] Discharge Instructions for Patients Receiving Chemotherapy  Today you received the following chemotherapy agents:  Abraxane.  To help prevent nausea and vomiting after your treatment, we encourage you to take your nausea medication as directed.   If you develop nausea and vomiting that is not controlled by your nausea medication, call the clinic.   BELOW ARE SYMPTOMS THAT SHOULD BE REPORTED IMMEDIATELY:  *FEVER GREATER THAN 100.5 F  *CHILLS WITH OR WITHOUT FEVER  NAUSEA AND VOMITING THAT IS NOT CONTROLLED WITH YOUR NAUSEA MEDICATION  *UNUSUAL SHORTNESS OF BREATH  *UNUSUAL BRUISING OR BLEEDING  TENDERNESS IN MOUTH AND THROAT WITH OR WITHOUT PRESENCE OF ULCERS  *URINARY PROBLEMS  *BOWEL PROBLEMS  UNUSUAL RASH Items with * indicate a potential emergency and should be followed up as soon as possible.  Feel free to call the clinic should you have any questions or concerns. The clinic phone number is [phone].  Please show the CHEMO ALERT CARD at check-in to the Emergency Department and triage nurse.   

## 2017-07-26 ENCOUNTER — Inpatient Hospital Stay: Payer: 59

## 2017-07-26 ENCOUNTER — Other Ambulatory Visit: Payer: Self-pay | Admitting: *Deleted

## 2017-07-26 ENCOUNTER — Other Ambulatory Visit: Payer: Self-pay | Admitting: Oncology

## 2017-07-26 ENCOUNTER — Telehealth: Payer: Self-pay | Admitting: *Deleted

## 2017-07-26 DIAGNOSIS — C786 Secondary malignant neoplasm of retroperitoneum and peritoneum: Secondary | ICD-10-CM

## 2017-07-26 DIAGNOSIS — Z7189 Other specified counseling: Secondary | ICD-10-CM

## 2017-07-26 DIAGNOSIS — C762 Malignant neoplasm of abdomen: Secondary | ICD-10-CM

## 2017-07-26 DIAGNOSIS — C5701 Malignant neoplasm of right fallopian tube: Secondary | ICD-10-CM

## 2017-07-26 DIAGNOSIS — C801 Malignant (primary) neoplasm, unspecified: Secondary | ICD-10-CM

## 2017-07-26 DIAGNOSIS — Z5111 Encounter for antineoplastic chemotherapy: Secondary | ICD-10-CM | POA: Diagnosis not present

## 2017-07-26 LAB — COMPREHENSIVE METABOLIC PANEL
ALT: 20 U/L (ref 0–55)
AST: 26 U/L (ref 5–34)
Albumin: 4.4 g/dL (ref 3.5–5.0)
Alkaline Phosphatase: 71 U/L (ref 40–150)
Anion gap: 9 (ref 3–11)
BUN: 10 mg/dL (ref 7–26)
CO2: 24 mmol/L (ref 22–29)
Calcium: 9.5 mg/dL (ref 8.4–10.4)
Chloride: 103 mmol/L (ref 98–109)
Creatinine, Ser: 0.66 mg/dL (ref 0.60–1.10)
GFR calc Af Amer: >60 mL/min (ref >60)
GFR calc non Af Amer: >60 mL/min (ref >60)
Glucose, Bld: 86 mg/dL (ref 70–140)
Potassium: 4.4 mmol/L (ref 3.5–5.1)
Sodium: 136 mmol/L (ref 136–145)
Total Bilirubin: 0.5 mg/dL (ref 0.2–1.2)
Total Protein: 7.3 g/dL (ref 6.4–8.3)

## 2017-07-26 LAB — CBC WITH DIFFERENTIAL/PLATELET
Basophils Absolute: 0 10*3/uL (ref 0.0–0.1)
Basophils Relative: 1 %
Eosinophils Absolute: 0 10*3/uL (ref 0.0–0.5)
Eosinophils Relative: 1 %
HCT: 31.7 % — ABNORMAL LOW (ref 34.8–46.6)
Hemoglobin: 10.4 g/dL — ABNORMAL LOW (ref 11.6–15.9)
Lymphocytes Relative: 57 %
Lymphs Abs: 1.3 10*3/uL (ref 0.9–3.3)
MCH: 33 pg (ref 25.1–34.0)
MCHC: 32.8 g/dL (ref 31.5–36.0)
MCV: 100.6 fL (ref 79.5–101.0)
Monocytes Absolute: 0.1 10*3/uL (ref 0.1–0.9)
Monocytes Relative: 6 %
Neutro Abs: 0.8 10*3/uL — ABNORMAL LOW (ref 1.5–6.5)
Neutrophils Relative %: 35 %
Platelets: 204 10*3/uL (ref 145–400)
RBC: 3.15 MIL/uL — ABNORMAL LOW (ref 3.70–5.45)
RDW: 18.2 % — ABNORMAL HIGH (ref 11.2–14.5)
WBC: 2.2 10*3/uL — ABNORMAL LOW (ref 3.9–10.3)

## 2017-07-26 MED ORDER — EMPTY CONTAINERS FLEXIBLE MISC
100.0000 mg/m2 | Freq: Once | Status: AC
  Administered 2017-07-26: 150 mg via INTRAVENOUS
  Filled 2017-07-26: qty 30

## 2017-07-26 MED ORDER — ONDANSETRON HCL 8 MG PO TABS
8.0000 mg | ORAL_TABLET | Freq: Once | ORAL | Status: AC
  Administered 2017-07-26: 8 mg via ORAL

## 2017-07-26 MED ORDER — PROCHLORPERAZINE MALEATE 10 MG PO TABS: 10.0000 mg | ORAL_TABLET | Freq: Once | ORAL | Status: DC

## 2017-07-26 MED ORDER — PROCHLORPERAZINE MALEATE 10 MG PO TABS
ORAL_TABLET | ORAL | Status: AC
  Filled 2017-07-26: qty 1

## 2017-07-26 MED ORDER — SODIUM CHLORIDE 0.9 % IV SOLN
Freq: Once | INTRAVENOUS | Status: AC
  Administered 2017-07-26: 13:00:00 via INTRAVENOUS

## 2017-07-26 MED ORDER — ONDANSETRON HCL 8 MG PO TABS
ORAL_TABLET | ORAL | Status: AC
  Filled 2017-07-26: qty 1

## 2017-07-26 NOTE — Patient Instructions (Signed)
[facility] Discharge Instructions for Patients Receiving Chemotherapy  Today you received the following chemotherapy agents:  Abraxane.  To help prevent nausea and vomiting after your treatment, we encourage you to take your nausea medication as directed.   If you develop nausea and vomiting that is not controlled by your nausea medication, call the clinic.   BELOW ARE SYMPTOMS THAT SHOULD BE REPORTED IMMEDIATELY:  *FEVER GREATER THAN 100.5 F  *CHILLS WITH OR WITHOUT FEVER  NAUSEA AND VOMITING THAT IS NOT CONTROLLED WITH YOUR NAUSEA MEDICATION  *UNUSUAL SHORTNESS OF BREATH  *UNUSUAL BRUISING OR BLEEDING  TENDERNESS IN MOUTH AND THROAT WITH OR WITHOUT PRESENCE OF ULCERS  *URINARY PROBLEMS  *BOWEL PROBLEMS  UNUSUAL RASH Items with * indicate a potential emergency and should be followed up as soon as possible.  Feel free to call the clinic should you have any questions or concerns. The clinic phone number is [phone].  Please show the CHEMO ALERT CARD at check-in to the Emergency Department and triage nurse.   

## 2017-07-26 NOTE — Telephone Encounter (Signed)
Noted ANC of 0.8 today and per MD recommendation - proceed with d 15 of Abraxane with Udenyca support late tomorrow.  Informed treatment room nurse and pharmacy.

## 2017-07-27 ENCOUNTER — Inpatient Hospital Stay: Payer: 59

## 2017-07-27 VITALS — BP 116/66 | HR 63 | Temp 97.7°F | Resp 18

## 2017-07-27 DIAGNOSIS — Z7189 Other specified counseling: Secondary | ICD-10-CM

## 2017-07-27 DIAGNOSIS — Z5111 Encounter for antineoplastic chemotherapy: Secondary | ICD-10-CM | POA: Diagnosis not present

## 2017-07-27 DIAGNOSIS — C5701 Malignant neoplasm of right fallopian tube: Secondary | ICD-10-CM

## 2017-07-27 DIAGNOSIS — C786 Secondary malignant neoplasm of retroperitoneum and peritoneum: Secondary | ICD-10-CM

## 2017-07-27 DIAGNOSIS — C762 Malignant neoplasm of abdomen: Secondary | ICD-10-CM

## 2017-07-27 DIAGNOSIS — C801 Malignant (primary) neoplasm, unspecified: Principal | ICD-10-CM

## 2017-07-27 LAB — CA 125: Cancer Antigen (CA) 125: 365.9 U/mL — ABNORMAL HIGH (ref 0.0–38.1)

## 2017-07-27 MED ORDER — PEGFILGRASTIM-CBQV 6 MG/0.6ML ~~LOC~~ SOSY
6.0000 mg | PREFILLED_SYRINGE | Freq: Once | SUBCUTANEOUS | Status: AC
  Administered 2017-07-27: 6 mg via SUBCUTANEOUS

## 2017-07-27 MED ORDER — PEGFILGRASTIM-CBQV 6 MG/0.6ML ~~LOC~~ SOSY
PREFILLED_SYRINGE | SUBCUTANEOUS | Status: AC
  Filled 2017-07-27: qty 0.6

## 2017-07-27 NOTE — Patient Instructions (Signed)
Pegfilgrastim injection ([facility]) What is this medicine? PEGFILGRASTIM (PEG fil gra stim) is a long-acting granulocyte colony-stimulating factor that stimulates the growth of neutrophils, a type of white blood cell important in the body's fight against infection. It is used to reduce the incidence of fever and infection in patients with certain types of cancer who are receiving chemotherapy that affects the bone marrow, and to increase survival after being exposed to high doses of radiation. This medicine may be used for other purposes; ask your health care provider or pharmacist if you have questions. COMMON BRAND NAME(S): Neulasta What should I tell my health care provider before I take this medicine? They need to know if you have any of these conditions: -kidney disease -latex allergy -ongoing radiation therapy -sickle cell disease -skin reactions to acrylic adhesives (On-Body Injector only) -an unusual or allergic reaction to pegfilgrastim, filgrastim, other medicines, foods, dyes, or preservatives -pregnant or trying to get pregnant -breast-feeding How should I use this medicine? This medicine is for injection under the skin. If you get this medicine at home, you will be taught how to prepare and give the pre-filled syringe or how to use the On-body Injector. Refer to the patient Instructions for Use for detailed instructions. Use exactly as directed. Tell your healthcare provider immediately if you suspect that the On-body Injector may not have performed as intended or if you suspect the use of the On-body Injector resulted in a missed or partial dose. It is important that you put your used needles and syringes in a special sharps container. Do not put them in a trash can. If you do not have a sharps container, call your pharmacist or healthcare provider to get one. Talk to your pediatrician regarding the use of this medicine in children. While this drug may be prescribed for selected  conditions, precautions do apply. Overdosage: If you think you have taken too much of this medicine contact a poison control center or emergency room at once. NOTE: This medicine is only for you. Do not share this medicine with others. What if I miss a dose? It is important not to miss your dose. Call your doctor or health care professional if you miss your dose. If you miss a dose due to an On-body Injector failure or leakage, a new dose should be administered as soon as possible using a single prefilled syringe for manual use. What may interact with this medicine? Interactions have not been studied. Give your health care provider a list of all the medicines, herbs, non-prescription drugs, or dietary supplements you use. Also tell them if you smoke, drink alcohol, or use illegal drugs. Some items may interact with your medicine. This list may not describe all possible interactions. Give your health care provider a list of all the medicines, herbs, non-prescription drugs, or dietary supplements you use. Also tell them if you smoke, drink alcohol, or use illegal drugs. Some items may interact with your medicine. What should I watch for while using this medicine? You may need blood work done while you are taking this medicine. If you are going to need a MRI, CT scan, or other procedure, tell your doctor that you are using this medicine (On-Body Injector only). What side effects may I notice from receiving this medicine? Side effects that you should report to your doctor or health care professional as soon as possible: -allergic reactions like skin rash, itching or hives, swelling of the face, lips, or tongue -dizziness -fever -pain, redness, or irritation at   site where injected -pinpoint red spots on the skin -red or dark-brown urine -shortness of breath or breathing problems -stomach or side pain, or pain at the shoulder -swelling -tiredness -trouble passing urine or change in the amount of  urine Side effects that usually do not require medical attention (report to your doctor or health care professional if they continue or are bothersome): -bone pain -muscle pain This list may not describe all possible side effects. Call your doctor for medical advice about side effects. You may report side effects to FDA at 1-800-FDA-1088. Where should I keep my medicine? Keep out of the reach of children. Store pre-filled syringes in a refrigerator between 2 and 8 degrees C (36 and 46 degrees F). Do not freeze. Keep in carton to protect from light. Throw away this medicine if it is left out of the refrigerator for more than 48 hours. Throw away any unused medicine after the expiration date. NOTE: This sheet is a summary. It may not cover all possible information. If you have questions about this medicine, talk to your doctor, pharmacist, or health care provider.  2018 Elsevier/Gold Standard (2016-01-29 12:58:03)  

## 2017-07-28 ENCOUNTER — Encounter: Payer: Self-pay | Admitting: Oncology

## 2017-08-01 ENCOUNTER — Telehealth: Payer: Self-pay

## 2017-08-01 NOTE — Telephone Encounter (Signed)
Pt called stating she has been having lose stools and low grade temp over the weekend- " didn't check my temp but I felt like it was about 99 and I took some Tylenol."   Pt instructed to check temp with a thermometer if she feels she's running a fever and to try to refrain from taking acetaminophen / ibuprofen because we want to see if her temp will spike above 100.78F as this would be an indicator that she may have an infection.  Pt verbalized understanding.   Pt states eating and drinking "fine" Pt encouraged to add a gatorade type drink to her fluids daily if still having lose stools. Pt instructed to call back if symptoms do not resolve or get worse.  Pt verbalizes understanding.

## 2017-08-02 ENCOUNTER — Other Ambulatory Visit: Payer: Self-pay | Admitting: Oncology

## 2017-08-02 ENCOUNTER — Telehealth: Payer: Self-pay | Admitting: Oncology

## 2017-08-02 DIAGNOSIS — R197 Diarrhea, unspecified: Secondary | ICD-10-CM | POA: Insufficient documentation

## 2017-08-02 MED ORDER — CHOLESTYRAMINE 4 G PO PACK: 4.0000 g | PACK | Freq: Two times a day (BID) | ORAL | 12 refills | Status: DC

## 2017-08-02 NOTE — Telephone Encounter (Signed)
Called pt to schedule a lab for today per 6/18 sch msg - left vm for pt to call back to schedule her lab

## 2017-08-02 NOTE — Progress Notes (Signed)
Kathryn Ramsey has severe diarrhea.  I called in [name] for her and asked her to come by and give [name] a sample.

## 2017-08-03 ENCOUNTER — Inpatient Hospital Stay: Payer: 59

## 2017-08-03 DIAGNOSIS — C762 Malignant neoplasm of abdomen: Secondary | ICD-10-CM

## 2017-08-03 DIAGNOSIS — Z5111 Encounter for antineoplastic chemotherapy: Secondary | ICD-10-CM | POA: Diagnosis not present

## 2017-08-03 DIAGNOSIS — C5701 Malignant neoplasm of right fallopian tube: Secondary | ICD-10-CM

## 2017-08-03 LAB — COMPREHENSIVE METABOLIC PANEL
ALT: 23 U/L (ref 0–55)
AST: 22 U/L (ref 5–34)
Albumin: 4.6 g/dL (ref 3.5–5.0)
Alkaline Phosphatase: 128 U/L (ref 40–150)
Anion gap: 9 (ref 3–11)
BUN: 7 mg/dL (ref 7–26)
CO2: 26 mmol/L (ref 22–29)
Calcium: 9.5 mg/dL (ref 8.4–10.4)
Chloride: 104 mmol/L (ref 98–109)
Creatinine, Ser: 0.72 mg/dL (ref 0.60–1.10)
GFR calc Af Amer: >60 mL/min (ref >60)
GFR calc non Af Amer: >60 mL/min (ref >60)
Glucose, Bld: 96 mg/dL (ref 70–140)
Potassium: 4 mmol/L (ref 3.5–5.1)
Sodium: 139 mmol/L (ref 136–145)
Total Bilirubin: 0.3 mg/dL (ref 0.2–1.2)
Total Protein: 7.9 g/dL (ref 6.4–8.3)

## 2017-08-03 LAB — CBC WITH DIFFERENTIAL/PLATELET
Basophils Absolute: 0.2 10*3/uL — ABNORMAL HIGH (ref 0.0–0.1)
Basophils Relative: 1 %
Eosinophils Absolute: 0 10*3/uL (ref 0.0–0.5)
Eosinophils Relative: 0 %
HCT: 35.4 % (ref 34.8–46.6)
Hemoglobin: 11.7 g/dL (ref 11.6–15.9)
Lymphocytes Relative: 16 %
Lymphs Abs: 2.5 10*3/uL (ref 0.9–3.3)
MCH: 33.9 pg (ref 25.1–34.0)
MCHC: 33.2 g/dL (ref 31.5–36.0)
MCV: 102.2 fL — ABNORMAL HIGH (ref 79.5–101.0)
Monocytes Absolute: 1.2 10*3/uL — ABNORMAL HIGH (ref 0.1–0.9)
Monocytes Relative: 8 %
Neutro Abs: 11.3 10*3/uL — ABNORMAL HIGH (ref 1.5–6.5)
Neutrophils Relative %: 75 %
Platelets: 268 10*3/uL (ref 145–400)
RBC: 3.46 MIL/uL — ABNORMAL LOW (ref 3.70–5.45)
RDW: 20.1 % — ABNORMAL HIGH (ref 11.2–14.5)
WBC: 15.2 10*3/uL — ABNORMAL HIGH (ref 3.9–10.3)

## 2017-08-03 LAB — TOTAL PROTEIN, URINE DIPSTICK: Protein, ur: NEGATIVE mg/dL

## 2017-08-04 ENCOUNTER — Telehealth: Payer: Self-pay | Admitting: *Deleted

## 2017-08-04 NOTE — Telephone Encounter (Signed)
"  Calling for lab results.  Not feeling well.  Head stopped up, lost's of bloody mucous, fever over the weekend but non today."      CBC-diff demonstrates high values so this nurse transferred call for further assistance.  CMET panel WNL.

## 2017-08-05 NOTE — Telephone Encounter (Signed)
This RN returned call to pt per message.  Kathryn Ramsey states she is having continued sinus pressure - she is using " a nettie pot several times a day ".  She has bloody mucous with nasal rinses.  This RN discussed above- including need to limit use of nettie pot to 1 time a day.  Discussed use of sudafed ( 24 hour release )- which patient took 1 time with benefit " but made me feel weird ".  Kathryn Ramsey inquired about her elevated WBC's - which this RN stated could be related to use of WBC growth factor used post her recent treatment.  Plan per call is pt will use sudafed at q every 4 hour dose - monitor benefit vs side effects. She will limit the nettie pot to 1 x daily and incorporate hot steam for sinus drainage.  She understands need for increased fluids and to call this office if she develops a fever.

## 2017-08-08 NOTE — Progress Notes (Signed)
Kathryn Ramsey  [phone]) [phone] Fax:(336) [phone]     ID: Kathryn Ramsey DOB: 1965/04/29  MR#: [id number]  HUT#:[id number]  Patient Care Team: Harlan Stains, MD as PCP - General (Family Medicine) [name], MD as Consulting Physician (Obstetrics and Gynecology) Juanita Craver, MD as Consulting Physician (Gastroenterology) Servando Salina, MD as Consulting Physician (Obstetrics and Gynecology) Nickie Retort, MD as Consulting Physician (Urology) Gillis Ends, MD as Referring Physician (Obstetrics and Gynecology) OTHER MD: Festus Aloe [phone])  CHIEF COMPLAINT:  ovarian cancer  CURRENT TREATMENT: Abraxane  INTERVAL HISTORY: Kathryn Ramsey returns today for a follow-up and treatment of her recurrent ovarian cancer accompanied by her sister Kathryn Ramsey, and Kathryn Ramsey. She is currently receiving Abraxane, given days 1, 8, and 15 of each 21-day cycle, with today being day 1 cycle 2.  She received udenyca on day 16 of cycle 1.   She has tolerated this treatment generally well. She notes that she had diarrhea and fever on days 2,3 and 4. The diarrhea has since resolved. She felt a decrease in energy but she is still very active. She is having nasal congestion with dry bloody mucus. She stopped claritin, which helped. She is experiencing transient sensory changes. She feels tingling and crawling on her lower legs, face and top of the head. She feels pulsing sensations on her back. These sensations are not constant and they completely resolve--for example they are not present right now.  She is having absolutely no peripheral neuropathy symptoms and specifically no numbness or tingling in her finger pads or toe pads.  She is losing her hair.   Her CA 125 is dropping significantly. Results for Kathryn Ramsey, Kathryn Ramsey (MRN [id number]) as of 08/09/2017 09:47  Ref. Range 04/28/2017 08:04 05/18/2017 08:29 06/17/2017 09:13 07/12/2017 13:44 07/26/2017 11:23  Cancer Antigen (CA) 125 Latest  Ref Range: 0.0 - 38.1 U/mL 485.5 (H) 560.6 (H) 609.9 (H) 655.9 (H) 365.9 (H)     REVIEW OF SYSTEMS: Kathryn Ramsey reports that she is going on vacation out of the country from 08/23/2017-09/10/2017. She denies unusual headaches, visual changes, nausea, vomiting, or dizziness. There has been no unusual cough, phlegm production, or pleurisy. This been no change in bowel or bladder habits. She denies unexplained fatigue or unexplained weight loss, bleeding, rash, or fever. A detailed review of systems was otherwise stable.    HISTORY OF PRESENT ILLNESS: From Dr. Serita Grit original intake note 03/17/2015:  "Kathryn Ramsey is a very pleasant G4P4 who is seen in consultation at the request of Dr Collene Mares and Dr Garwin Brothers for peritoneal carcinomatosis. The patient has a history of a workup by urology for gross hematuria which included a pelvic examine was suggested for extrinsic mass effect on the bladder on cystoscopy. Cystoscopy took place on in December 2016. The patient denies abdominal pain bloating early satiety or abdominal distention.  On 08/03/2015 at [facility] she underwent a CT scan of the abdomen and pelvis as ordered by Dr Baruch Gouty. This revealed a 1.4 cm low attenuation lesion on the posterior right hepatic lobe suggestive of a hemangioma. Status post hysterectomy. No ovarian masses. Moderate ascites. Omental caking seen in the lateral left abdomen and pelvis. Peritoneal nodularity in the left lateral pelvic cul-de-sac. No gross extrinsic compression on the bladder was identified on imaging.  The patient was then seen by her gastroenterologist, Dr. Collene Mares, who performed a colonoscopy which was unremarkable.  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 She was treated adjuvantly with carboplatin and paclitaxel, as detailed below. Her CA 125 dropped from 1226 on 03/20/2015 to 26.1 by 06/16/2015.  Her subsequent history is as detailed below.  I PAST MEDICAL HISTORY: Past Medical History:  Diagnosis Date  . Acute sensory neuropathy 06/26/2015  . Allergy   . Anemia   . Asthma    illness induced asthma  . Blood transfusion without reported diagnosis    at age 30 years old D/T surgery to femur being crushed  . 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 Heart murmur    pt, states had a "working heart murmur"  . History of kidney stones   . PONV (postoperative nausea and vomiting)   . Skin cancer     PAST SURGICAL HISTORY: Past Surgical History:  Procedure Laterality Date  . 3 laporscopic proceedures    . ABDOMINAL HYSTERECTOMY     2010  . CHOLECYSTECTOMY     2010  . DILATION AND CURETTAGE OF UTERUS     1997  . LAPAROSCOPY N/A 03/27/2015   Procedure: DIAGNOSTIC LAPAROSCOPY ;  Surgeon: [name], MD;  Location: [facility];  Service: Gynecology;  Laterality: N/A;  . LAPAROSCOPY N/A 02/24/2016   Procedure: LAPAROSCOPY DIAGNOSTIC WITH PERITONEAL WASHINGS AND PERITONEAL BIOPSY;  Surgeon: [name], MD;  Location: [facility];  Service: Gynecology;  Laterality: N/A;  . LAPAROTOMY N/A 03/27/2015   Procedure: EXPLORATORY LAPAROTOMY, BILATERAL SALPINGO OOPHORECTOMY, OMENTECTOMY, RADICAL TUMOR DEBULKING;  Surgeon: [name], MD;  Location: [facility];  Service: Gynecology;  Laterality: N/A;  . sinus surgery 1994      FAMILY HISTORY Family History  Problem Relation Age of Onset  . Hypertension Father   . Skin cancer Father        nonmelanoma skin cancers in his late 58s  . Non-Hodgkin's lymphoma Maternal Aunt        dx. 56s; smoker  . Stroke Maternal Grandmother   . Other Mother         benign meningioma dx. early-mid-70s; hysterectomy in her late 60s for heavy periods - still has ovaries  . Other Son        one son with pre-cancerous skin findings  . Other Daughter        cysts on ovaries and hx of heavy periods  . Other Sister        hysterectomy for cysts - still has ovaries  . Heart attack Maternal Grandfather   . Heart attack Paternal Grandfather   . Renal cancer Maternal Aunt 60       smoker  . Breast cancer Maternal Aunt 78  . Other Maternal Aunt        dx. benign brain tumor (meningioma) at 80; 2nd benign brain tumor in her late 26s; hx of radical hysterectomy at age 68  . Brain cancer Other        NOS tumor  The patient's parents are both living, in their late 78s as of January 2018. The patient has one brother, 3 sisters. There is no history of ovarian cancer in the family. One maternal aunt had breast and kidney cancer, another had non-Hodgkin's lymphoma.  GYNECOLOGIC HISTORY:  No LMP recorded. Patient has had a hysterectomy. 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 Her Kathryn Ramsey Kathryn Ramsey is a Engineer, maintenance (IT). Their children are 47, 84, 18 and 7 y/o as of JAN 2018. The oldest works as an Electrical engineer in the [employer] in [place]. The other 3 children live in [place], the youngest currently attending [employer]. The patient has no grandchildren. She attends [employer]    ADVANCED DIRECTIVES:    HEALTH MAINTENANCE: Social History   Tobacco Use  . Smoking status: Never Smoker  . Smokeless tobacco: Never Used  Substance Use Topics  . Alcohol use: Yes    Comment:  3 glasses wine or beer/week  . Drug use: No     Colonoscopy:2016  PAP: s/p hyst  Bone density:   Allergies  Allergen Reactions  . Bee Venom Shortness Of Breath    swelling  . Sulfa Antibiotics Shortness Of Breath    Vomit , diarrhea , hives  . Iodinated Diagnostic Agents Other (See  Comments)    01/27/17 Pt reports sneezing and congestion. Pt states she takes claritin as a premed for CT scan in [place]. Premedicated with Benadryl 25 mg PO. Post CT,no sneezing but had mild drainage/congestion which resolved after a few minutes with oral fluids.    Current Outpatient Medications  Medication Sig Dispense Refill  . cholestyramine (QUESTRAN) 4 g packet Take 1 packet (4 g total) by mouth 2 (two) times daily. 60 each 12  . gabapentin (NEURONTIN) 100 MG capsule Take 1 capsule (100 mg total) by mouth at bedtime. Takes 119m at bedtime. Takes an additional 109mduring the day if needed for nerve pain. (Patient not taking: Reported on 04/20/2017) 90 capsule 4  . loratadine (CLARITIN) 10 MG tablet Take 10 mg by mouth daily.    . prochlorperazine (COMPAZINE) 10 MG tablet Take 1 tablet (10 mg total) by mouth every 6 (six) hours as needed (Nausea or vomiting). 30 tablet 1  . valACYclovir (VALTREX) 500 MG tablet Take 1 tablet (500 mg total) by mouth daily. (Patient not taking: Reported on 05/27/2017) 90 tablet 4   No current facility-administered medications for this visit.     OBJECTIVE: Middle-aged white woman who appears stated age  Vi42  08/09/17 1146  BP: 136/69  Pulse: (!) 57  Resp: 18  Temp: 98 F (36.7 C)  SpO2: 100%     Body mass index is 21.19 kg/m.      ECOG FS:1 - Symptomatic but completely ambulatory  Sclerae unicteric, EOMs intact Oropharynx clear and moist No cervical or supraclavicular adenopathy Lungs no rales or rhonchi Heart regular rate and rhythm Abd soft, nontender, positive bowel sounds; the port site recurrences are entirely unapparent MSK no focal spinal tenderness, no upper extremity lymphedema Neuro: nonfocal, well oriented, appropriate affect Breasts: Deferred   LAB RESULTS:  CMP     Component Value Date/Time   NA 139 08/03/2017 0838   NA 140 02/11/2017 0755   K 4.0 08/03/2017 0838   K 4.4 02/11/2017 0755   CL 104 08/03/2017 0838    CO2 26 08/03/2017 0838   CO2 26 02/11/2017 0755   GLUCOSE 96 08/03/2017 0838   GLUCOSE 93 02/11/2017 0755   BUN 7 08/03/2017 0838   BUN 7.9 02/11/2017 0755   CREATININE 0.72 08/03/2017 0838   CREATININE 0.72 03/25/2017 0832   CREATININE 0.7 02/11/2017 0755  CALCIUM 9.5 08/03/2017 0838   CALCIUM 9.6 02/11/2017 0755   PROT 7.9 08/03/2017 0838   PROT 7.5 02/11/2017 0755   ALBUMIN 4.6 08/03/2017 0838   ALBUMIN 4.4 02/11/2017 0755   AST 22 08/03/2017 0838   AST 74 (H) 03/25/2017 0832   AST 27 02/11/2017 0755   ALT 23 08/03/2017 0838   ALT 155 (H) 03/25/2017 0832   ALT 24 02/11/2017 0755   ALKPHOS 128 08/03/2017 0838   ALKPHOS 58 02/11/2017 0755   BILITOT 0.3 08/03/2017 0838   BILITOT 0.4 03/25/2017 0832   BILITOT 0.43 02/11/2017 0755   GFRNONAA >60 08/03/2017 0838   GFRNONAA >60 03/25/2017 0832   GFRAA >60 08/03/2017 0838   GFRAA >60 03/25/2017 0832    INo results found for: SPEP, UPEP  Lab Results  Component Value Date   WBC 10.4 (H) 08/09/2017   NEUTROABS 7.8 (H) 08/09/2017   HGB 11.5 (L) 08/09/2017   HCT 35.2 08/09/2017   MCV 103.2 (H) 08/09/2017   PLT 232 08/09/2017      Chemistry      Component Value Date/Time   NA 139 08/03/2017 0838   NA 140 02/11/2017 0755   K 4.0 08/03/2017 0838   K 4.4 02/11/2017 0755   CL 104 08/03/2017 0838   CO2 26 08/03/2017 0838   CO2 26 02/11/2017 0755   BUN 7 08/03/2017 0838   BUN 7.9 02/11/2017 0755   CREATININE 0.72 08/03/2017 0838   CREATININE 0.72 03/25/2017 0832   CREATININE 0.7 02/11/2017 0755      Component Value Date/Time   CALCIUM 9.5 08/03/2017 0838   CALCIUM 9.6 02/11/2017 0755   ALKPHOS 128 08/03/2017 0838   ALKPHOS 58 02/11/2017 0755   AST 22 08/03/2017 0838   AST 74 (H) 03/25/2017 0832   AST 27 02/11/2017 0755   ALT 23 08/03/2017 0838   ALT 155 (H) 03/25/2017 0832   ALT 24 02/11/2017 0755   BILITOT 0.3 08/03/2017 0838   BILITOT 0.4 03/25/2017 0832   BILITOT 0.43 02/11/2017 0755       No results  found for: LABCA2  No components found for: DEYCX448  No results for input(s): INR in the last 168 hours.  Urinalysis    Component Value Date/Time   COLORURINE STRAW (A) 04/08/2017 1123   APPEARANCEUR CLEAR 04/08/2017 1123   LABSPEC 1.003 (L) 04/08/2017 1123   LABSPEC 1.005 10/09/2015 1239   PHURINE 8.0 04/08/2017 1123   GLUCOSEU NEGATIVE 04/08/2017 1123   GLUCOSEU Negative 10/09/2015 1239   HGBUR NEGATIVE 04/08/2017 1123   BILIRUBINUR NEGATIVE 04/08/2017 1123   BILIRUBINUR Negative 10/09/2015 [place] 04/08/2017 1123   PROTEINUR negative 08/03/2017 0837   UROBILINOGEN 0.2 10/09/2015 1239   NITRITE NEGATIVE 04/08/2017 1123   LEUKOCYTESUR NEGATIVE 04/08/2017 1123   LEUKOCYTESUR Negative 10/09/2015 1239    STUDIES:  No results found.   ELIGIBLE FOR AVAILABLE RESEARCH PROTOCOL:  discussing protocols at [facility] at [place]: 52 y.o. BRCA negative [place] woman status post radical tumor debulking with optimal cytoreduction (R0) 03/27/2015 for a stage IIIC, high-grade right fallopian tube carcinoma  (a) baseline CA-125 was1226.  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)  (c) tumor is PD-L1 negative (02/24/2016)  (d) tumor is strongly estrogen receptor positive, progesterone receptor negative (02/24/2016)  (1) adjuvant chemotherapy consisted of carboplatin and paclitaxel for 6  doses, begun 04/14/2015, completed 08/11/2015  (a) paclitaxel was omitted from cycle 5 and dose reduced on cycle 6 because of neuropathy   (b) a "make up" dose of paclitaxel was given 08/25/2015  (c) last carboplatin dose was 08/11/2015  (d) CA-125 normalized by 06/16/2015   (2) FIRST RECURRENCE: January 2018  (a) CA-125 rise beginning November 2017 led to CT scans and PET scans December 2017, all  negative  (b) exploratory laparotomy 02/24/2016 showed pathologically confirmed recurrence, with miliary disease involving all examined surfaces  (c) port-site metastases noted 03/08/2016  (3) carboplatin/liposomal doxorubicin started 03/05/2016, repeated every three weeks x4, completed 06/04/2016.  (a) cycle 3 delayed one week because of neutropenia; OnPro added  (b) CA 125 normalized after cycle 3  (4) RUCAPARIB maintenance started 07/02/2016  (a) restaging 10/01/2016: Normal CA 125, negative CT of the abdomen and pelvis  (b) labs 11/24/2016 shows a rise in her CA 125-43.4, with continuing rise thereafter  (c) rucaparib discontinued October 2018 with rising CA 125  (5) anastrozole started December 23, 2016-stopped after a couple of weeks  SECOND RECURRENCE: (6) Gemcitabine/Bevacizumab started on 02/04/2017  (a) gemcitabine omitted cycle 4 because of intercurrent infection  (b) gemcitabine resumed with cycle 5, with intervening rise in  Ca 125  (c) repeat CT scan of the abdomen and pelvis on 05/26/2017 does not confirm obvious disease progression  (d) cycle 5 of gemcitabine/bevacizumab delayed 1 week   (e) gemcitabine/ bevacizumab discontinued after 6 cycles, with rising CA 125 (last dose 06/24/2017)  (7) foundation one testing in process  (8) Abraxane started 07/12/2017, repeated weekly x3 with neulasta day 16 first cycle  (a) cycle 2 started 08/09/2017, will be day 1 day 8 only  (b) cycle 3 scheduled to start 09/13/2017  PLAN Mckensey is tolerating the Abraxane moderately well.  She will likely complete her hair loss in the next couple of weeks.  She is having mild fatigue (she remains very active) and she is having some unusual sensory symptoms which are transient, but no evidence of peripheral neuropathy.  Her counts are adequate.  Importantly her CA 125 appears to be dropping.  While she is expressing some reluctance to continue these treatments, pretty much everything else I have  will cause her more not fewer symptoms.  I would like to get as much mileage as we can add of Abraxane before switching.  Accordingly I strongly urged her to go ahead and proceed to a third cycle after she returns from her vacation.  Taxanes certainly can irritate the nasal mucosa.  I have asked her to keep it moist with saline, gave her the right recipe, and asked her not to blow her nose  After we complete the Abraxane she will be completely restaged  She knows to call for any other issues that may develop before the next visit.    Magrinat, Virgie Dad, MD  08/09/17 12:15 PM Medical Oncology and Hematology [facility] [address] [place], [place] [address] Tel. [phone]    Fax. [phone]  Alice Rieger, am acting as scribe for Chauncey Cruel MD.  I, Lurline Del MD, have reviewed the above documentation for accuracy and completeness, and I agree with the above.

## 2017-08-09 ENCOUNTER — Inpatient Hospital Stay (HOSPITAL_BASED_OUTPATIENT_CLINIC_OR_DEPARTMENT_OTHER): Payer: 59 | Admitting: Oncology

## 2017-08-09 ENCOUNTER — Telehealth: Payer: Self-pay | Admitting: Adult Health

## 2017-08-09 ENCOUNTER — Inpatient Hospital Stay: Payer: 59

## 2017-08-09 VITALS — BP 136/69 | HR 57 | Temp 98.0°F | Resp 18 | Wt 119.6 lb

## 2017-08-09 DIAGNOSIS — C801 Malignant (primary) neoplasm, unspecified: Principal | ICD-10-CM

## 2017-08-09 DIAGNOSIS — R530 Neoplastic (malignant) related fatigue: Secondary | ICD-10-CM

## 2017-08-09 DIAGNOSIS — C762 Malignant neoplasm of abdomen: Secondary | ICD-10-CM

## 2017-08-09 DIAGNOSIS — C786 Secondary malignant neoplasm of retroperitoneum and peritoneum: Secondary | ICD-10-CM

## 2017-08-09 DIAGNOSIS — D701 Agranulocytosis secondary to cancer chemotherapy: Secondary | ICD-10-CM

## 2017-08-09 DIAGNOSIS — Z9221 Personal history of antineoplastic chemotherapy: Secondary | ICD-10-CM

## 2017-08-09 DIAGNOSIS — Z8051 Family history of malignant neoplasm of kidney: Secondary | ICD-10-CM

## 2017-08-09 DIAGNOSIS — G629 Polyneuropathy, unspecified: Secondary | ICD-10-CM | POA: Diagnosis not present

## 2017-08-09 DIAGNOSIS — C5701 Malignant neoplasm of right fallopian tube: Secondary | ICD-10-CM

## 2017-08-09 DIAGNOSIS — Z7189 Other specified counseling: Secondary | ICD-10-CM

## 2017-08-09 DIAGNOSIS — Z79899 Other long term (current) drug therapy: Secondary | ICD-10-CM | POA: Diagnosis not present

## 2017-08-09 DIAGNOSIS — Z5111 Encounter for antineoplastic chemotherapy: Secondary | ICD-10-CM | POA: Diagnosis not present

## 2017-08-09 LAB — COMPREHENSIVE METABOLIC PANEL
ALT: 29 U/L (ref 0–44)
AST: 27 U/L (ref 15–41)
Albumin: 4.7 g/dL (ref 3.5–5.0)
Alkaline Phosphatase: 105 U/L (ref 38–126)
Anion gap: 11 (ref 5–15)
BUN: 10 mg/dL (ref 6–20)
CO2: 25 mmol/L (ref 22–32)
Calcium: 9.9 mg/dL (ref 8.9–10.3)
Chloride: 100 mmol/L (ref 98–111)
Creatinine, Ser: 0.72 mg/dL (ref 0.44–1.00)
GFR calc Af Amer: >60 mL/min (ref >60)
GFR calc non Af Amer: >60 mL/min (ref >60)
Glucose, Bld: 80 mg/dL (ref 70–99)
Potassium: 4.2 mmol/L (ref 3.5–5.1)
Sodium: 136 mmol/L (ref 135–145)
Total Bilirubin: 0.5 mg/dL (ref 0.3–1.2)
Total Protein: 8.1 g/dL (ref 6.5–8.1)

## 2017-08-09 LAB — CBC WITH DIFFERENTIAL/PLATELET
Basophils Absolute: 0 10*3/uL (ref 0.0–0.1)
Basophils Relative: 0 %
Eosinophils Absolute: 0 10*3/uL (ref 0.0–0.5)
Eosinophils Relative: 0 %
HCT: 35.2 % (ref 34.8–46.6)
Hemoglobin: 11.5 g/dL — ABNORMAL LOW (ref 11.6–15.9)
Lymphocytes Relative: 20 %
Lymphs Abs: 2.1 10*3/uL (ref 0.9–3.3)
MCH: 33.7 pg (ref 25.1–34.0)
MCHC: 32.7 g/dL (ref 31.5–36.0)
MCV: 103.2 fL — ABNORMAL HIGH (ref 79.5–101.0)
Monocytes Absolute: 0.5 10*3/uL (ref 0.1–0.9)
Monocytes Relative: 4 %
Neutro Abs: 7.8 10*3/uL — ABNORMAL HIGH (ref 1.5–6.5)
Neutrophils Relative %: 76 %
Platelets: 232 10*3/uL (ref 145–400)
RBC: 3.41 MIL/uL — ABNORMAL LOW (ref 3.70–5.45)
RDW: 17.8 % — ABNORMAL HIGH (ref 11.2–14.5)
WBC: 10.4 10*3/uL — ABNORMAL HIGH (ref 3.9–10.3)

## 2017-08-09 MED ORDER — ONDANSETRON HCL 8 MG PO TABS
ORAL_TABLET | ORAL | Status: AC
Start: 2017-08-09 — End: ?
  Filled 2017-08-09: qty 1

## 2017-08-09 MED ORDER — SODIUM CHLORIDE 0.9 % IV SOLN
Freq: Once | INTRAVENOUS | Status: AC
  Administered 2017-08-09: 13:00:00 via INTRAVENOUS

## 2017-08-09 MED ORDER — PACLITAXEL PROTEIN-BOUND CHEMO INJECTION 100 MG
100.0000 mg/m2 | Freq: Once | Status: AC
  Administered 2017-08-09: 150 mg via INTRAVENOUS
  Filled 2017-08-09: qty 30

## 2017-08-09 MED ORDER — ONDANSETRON HCL 8 MG PO TABS
8.0000 mg | ORAL_TABLET | Freq: Once | ORAL | Status: AC
  Administered 2017-08-09: 8 mg via ORAL

## 2017-08-09 NOTE — Patient Instructions (Signed)
[facility] Discharge Instructions for Patients Receiving Chemotherapy  Today you received the following chemotherapy agents Abraxane and.  To help prevent nausea and vomiting after your treatment, we encourage you to take your nausea medication as directed   If you develop nausea and vomiting that is not controlled by your nausea medication, call the clinic.   BELOW ARE SYMPTOMS THAT SHOULD BE REPORTED IMMEDIATELY:  *FEVER GREATER THAN 100.5 F  *CHILLS WITH OR WITHOUT FEVER  NAUSEA AND VOMITING THAT IS NOT CONTROLLED WITH YOUR NAUSEA MEDICATION  *UNUSUAL SHORTNESS OF BREATH  *UNUSUAL BRUISING OR BLEEDING  TENDERNESS IN MOUTH AND THROAT WITH OR WITHOUT PRESENCE OF ULCERS  *URINARY PROBLEMS  *BOWEL PROBLEMS  UNUSUAL RASH Items with * indicate a potential emergency and should be followed up as soon as possible.  Feel free to call the clinic should you have any questions or concerns. The clinic phone number is [phone].  Please show the [facility] at check-in to the Emergency Department and triage nurse.

## 2017-08-09 NOTE — Telephone Encounter (Signed)
Gave avs and calendar ° °

## 2017-08-10 LAB — CA 125: Cancer Antigen (CA) 125: 217.5 U/mL — ABNORMAL HIGH (ref 0.0–38.1)

## 2017-08-12 ENCOUNTER — Encounter: Payer: Self-pay | Admitting: Oncology

## 2017-08-12 ENCOUNTER — Telehealth: Payer: Self-pay | Admitting: Adult Health

## 2017-08-12 NOTE — Telephone Encounter (Signed)
Injection appointment changed per Jenny Reichmann flush nurse 6/28

## 2017-08-15 ENCOUNTER — Telehealth: Payer: Self-pay

## 2017-08-15 ENCOUNTER — Other Ambulatory Visit: Payer: Self-pay | Admitting: Oncology

## 2017-08-15 NOTE — Telephone Encounter (Signed)
Called Kathryn Ramsey and she is going to keep her appointments the way they are for now. Gardiner Rhyme

## 2017-08-16 ENCOUNTER — Inpatient Hospital Stay (HOSPITAL_BASED_OUTPATIENT_CLINIC_OR_DEPARTMENT_OTHER): Payer: 59 | Admitting: Medical

## 2017-08-16 ENCOUNTER — Inpatient Hospital Stay: Payer: 59 | Attending: Oncology

## 2017-08-16 ENCOUNTER — Other Ambulatory Visit: Payer: Self-pay | Admitting: Medical

## 2017-08-16 ENCOUNTER — Inpatient Hospital Stay: Payer: 59

## 2017-08-16 VITALS — BP 110/68 | HR 60 | Temp 98.1°F | Resp 18

## 2017-08-16 DIAGNOSIS — Z5189 Encounter for other specified aftercare: Secondary | ICD-10-CM | POA: Insufficient documentation

## 2017-08-16 DIAGNOSIS — Z7189 Other specified counseling: Secondary | ICD-10-CM

## 2017-08-16 DIAGNOSIS — Z803 Family history of malignant neoplasm of breast: Secondary | ICD-10-CM | POA: Insufficient documentation

## 2017-08-16 DIAGNOSIS — G620 Drug-induced polyneuropathy: Secondary | ICD-10-CM | POA: Insufficient documentation

## 2017-08-16 DIAGNOSIS — Z8051 Family history of malignant neoplasm of kidney: Secondary | ICD-10-CM | POA: Diagnosis not present

## 2017-08-16 DIAGNOSIS — Z5111 Encounter for antineoplastic chemotherapy: Secondary | ICD-10-CM | POA: Diagnosis not present

## 2017-08-16 DIAGNOSIS — Z8249 Family history of ischemic heart disease and other diseases of the circulatory system: Secondary | ICD-10-CM | POA: Insufficient documentation

## 2017-08-16 DIAGNOSIS — R070 Pain in throat: Secondary | ICD-10-CM

## 2017-08-16 DIAGNOSIS — C786 Secondary malignant neoplasm of retroperitoneum and peritoneum: Secondary | ICD-10-CM | POA: Diagnosis not present

## 2017-08-16 DIAGNOSIS — C5701 Malignant neoplasm of right fallopian tube: Secondary | ICD-10-CM

## 2017-08-16 DIAGNOSIS — Z85828 Personal history of other malignant neoplasm of skin: Secondary | ICD-10-CM | POA: Insufficient documentation

## 2017-08-16 DIAGNOSIS — J0100 Acute maxillary sinusitis, unspecified: Secondary | ICD-10-CM | POA: Diagnosis not present

## 2017-08-16 DIAGNOSIS — C762 Malignant neoplasm of abdomen: Secondary | ICD-10-CM

## 2017-08-16 DIAGNOSIS — Z808 Family history of malignant neoplasm of other organs or systems: Secondary | ICD-10-CM | POA: Diagnosis not present

## 2017-08-16 DIAGNOSIS — Z5112 Encounter for antineoplastic immunotherapy: Secondary | ICD-10-CM | POA: Diagnosis present

## 2017-08-16 DIAGNOSIS — C801 Malignant (primary) neoplasm, unspecified: Secondary | ICD-10-CM

## 2017-08-16 LAB — COMPREHENSIVE METABOLIC PANEL
ALT: 19 U/L (ref 0–44)
AST: 19 U/L (ref 15–41)
Albumin: 4.5 g/dL (ref 3.5–5.0)
Alkaline Phosphatase: 72 U/L (ref 38–126)
Anion gap: 6 (ref 5–15)
BUN: 5 mg/dL — ABNORMAL LOW (ref 6–20)
CO2: 28 mmol/L (ref 22–32)
Calcium: 9.5 mg/dL (ref 8.9–10.3)
Chloride: 104 mmol/L (ref 98–111)
Creatinine, Ser: 0.69 mg/dL (ref 0.44–1.00)
GFR calc Af Amer: >60 mL/min (ref >60)
GFR calc non Af Amer: >60 mL/min (ref >60)
Glucose, Bld: 82 mg/dL (ref 70–99)
Potassium: 4.1 mmol/L (ref 3.5–5.1)
Sodium: 138 mmol/L (ref 135–145)
Total Bilirubin: 0.6 mg/dL (ref 0.3–1.2)
Total Protein: 7.4 g/dL (ref 6.5–8.1)

## 2017-08-16 LAB — CBC WITH DIFFERENTIAL/PLATELET
Basophils Absolute: 0 10*3/uL (ref 0.0–0.1)
Basophils Relative: 0 %
Eosinophils Absolute: 0 10*3/uL (ref 0.0–0.5)
Eosinophils Relative: 0 %
HCT: 32.1 % — ABNORMAL LOW (ref 34.8–46.6)
Hemoglobin: 10.5 g/dL — ABNORMAL LOW (ref 11.6–15.9)
Lymphocytes Relative: 37 %
Lymphs Abs: 1.4 10*3/uL (ref 0.9–3.3)
MCH: 33.8 pg (ref 25.1–34.0)
MCHC: 32.7 g/dL (ref 31.5–36.0)
MCV: 103.2 fL — ABNORMAL HIGH (ref 79.5–101.0)
Monocytes Absolute: 0.1 10*3/uL (ref 0.1–0.9)
Monocytes Relative: 3 %
Neutro Abs: 2.2 10*3/uL (ref 1.5–6.5)
Neutrophils Relative %: 60 %
Platelets: 218 10*3/uL (ref 145–400)
RBC: 3.11 MIL/uL — ABNORMAL LOW (ref 3.70–5.45)
RDW: 16.4 % — ABNORMAL HIGH (ref 11.2–14.5)
WBC: 3.7 10*3/uL — ABNORMAL LOW (ref 3.9–10.3)

## 2017-08-16 LAB — TOTAL PROTEIN, URINE DIPSTICK: Protein, ur: NEGATIVE mg/dL

## 2017-08-16 MED ORDER — PACLITAXEL PROTEIN-BOUND CHEMO INJECTION 100 MG
100.0000 mg/m2 | Freq: Once | Status: AC
  Administered 2017-08-16: 150 mg via INTRAVENOUS
  Filled 2017-08-16: qty 30

## 2017-08-16 MED ORDER — SODIUM CHLORIDE 0.9 % IV SOLN
Freq: Once | INTRAVENOUS | Status: AC
  Administered 2017-08-16: 13:00:00 via INTRAVENOUS

## 2017-08-16 MED ORDER — ONDANSETRON HCL 8 MG PO TABS
8.0000 mg | ORAL_TABLET | Freq: Once | ORAL | Status: AC
  Administered 2017-08-16: 8 mg via ORAL

## 2017-08-16 MED ORDER — CEFUROXIME AXETIL 250 MG PO TABS: 250.0000 mg | ORAL_TABLET | Freq: Two times a day (BID) | ORAL | 0 refills | Status: AC

## 2017-08-16 MED ORDER — FLUTICASONE PROPIONATE 50 MCG/ACT NA SUSP: 1.0000 | Freq: Two times a day (BID) | NASAL | 2 refills | Status: DC

## 2017-08-16 MED ORDER — ONDANSETRON HCL 8 MG PO TABS
ORAL_TABLET | ORAL | Status: AC
  Filled 2017-08-16: qty 1

## 2017-08-16 MED ORDER — PROCHLORPERAZINE MALEATE 10 MG PO TABS: 10.0000 mg | ORAL_TABLET | Freq: Once | ORAL | Status: DC

## 2017-08-16 NOTE — Patient Instructions (Signed)
[place] Discharge Instructions for Patients Receiving Chemotherapy  Today you received the following chemotherapy agents Abraxane and.  To help prevent nausea and vomiting after your treatment, we encourage you to take your nausea medication as directed   If you develop nausea and vomiting that is not controlled by your nausea medication, call the clinic.   BELOW ARE SYMPTOMS THAT SHOULD BE REPORTED IMMEDIATELY:  *FEVER GREATER THAN 100.5 F  *CHILLS WITH OR WITHOUT FEVER  NAUSEA AND VOMITING THAT IS NOT CONTROLLED WITH YOUR NAUSEA MEDICATION  *UNUSUAL SHORTNESS OF BREATH  *UNUSUAL BRUISING OR BLEEDING  TENDERNESS IN MOUTH AND THROAT WITH OR WITHOUT PRESENCE OF ULCERS  *URINARY PROBLEMS  *BOWEL PROBLEMS  UNUSUAL RASH Items with * indicate a potential emergency and should be followed up as soon as possible.  Feel free to call the clinic should you have any questions or concerns. The clinic phone number is [phone].  Please show the [facility] at check-in to the Emergency Department and triage nurse.

## 2017-08-16 NOTE — Progress Notes (Signed)
Pt requests Zofran 8mg  po as premedication prior to chemo. Allergy listed to compazine. Orders changed to zofran for today and future orders. Compazine discontinued from treatment plan.  Hardie Pulley, PharmD, BCPS, BCOP

## 2017-08-17 ENCOUNTER — Ambulatory Visit: Payer: 59

## 2017-08-17 ENCOUNTER — Inpatient Hospital Stay: Payer: 59

## 2017-08-17 VITALS — BP 114/67 | HR 73 | Temp 98.1°F | Resp 20

## 2017-08-17 DIAGNOSIS — Z5111 Encounter for antineoplastic chemotherapy: Secondary | ICD-10-CM | POA: Diagnosis not present

## 2017-08-17 DIAGNOSIS — C5701 Malignant neoplasm of right fallopian tube: Secondary | ICD-10-CM

## 2017-08-17 DIAGNOSIS — C786 Secondary malignant neoplasm of retroperitoneum and peritoneum: Secondary | ICD-10-CM

## 2017-08-17 DIAGNOSIS — C762 Malignant neoplasm of abdomen: Secondary | ICD-10-CM

## 2017-08-17 DIAGNOSIS — Z7189 Other specified counseling: Secondary | ICD-10-CM

## 2017-08-17 DIAGNOSIS — C801 Malignant (primary) neoplasm, unspecified: Principal | ICD-10-CM

## 2017-08-17 LAB — CA 125: Cancer Antigen (CA) 125: 114.3 U/mL — ABNORMAL HIGH (ref 0.0–38.1)

## 2017-08-17 MED ORDER — PEGFILGRASTIM-CBQV 6 MG/0.6ML ~~LOC~~ SOSY
PREFILLED_SYRINGE | SUBCUTANEOUS | Status: AC
Start: 2017-08-17 — End: ?
  Filled 2017-08-17: qty 0.6

## 2017-08-17 MED ORDER — PEGFILGRASTIM-CBQV 6 MG/0.6ML ~~LOC~~ SOSY
6.0000 mg | PREFILLED_SYRINGE | Freq: Once | SUBCUTANEOUS | Status: AC
  Administered 2017-08-17: 6 mg via SUBCUTANEOUS

## 2017-08-17 NOTE — Progress Notes (Signed)
[facility] Progress Note   Kathryn Ramsey [id number] January 20, 1966 52 y.o.  Kathryn Ramsey is managed by Dr. Jana Hakim  Actively treated with chemotherapy/immunotherapy: yes  Current Therapy: Abraxane  Last Treated: 08/16/2017 (cycle 2, day 8)  Assessment: Plan:    Acute maxillary sinusitis, recurrence not specified  Cancer of right fallopian tube ([name])   Acute sinusitis: The patient was given a prescription for Ceftin 250 mg p.o. twice daily x10 days.  Additionally she was given a prescription for Flonase with directions to use 1 spray per nostril twice daily.   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  Please see After Visit Summary for patient specific instructions.  Future Appointments  Date Time Provider [place]  08/17/2017  2:45 PM [facility] INJ NURSE [facility] None  09/12/2017 11:15 AM [facility] LAB 6 [facility] None  09/13/2017  9:00 AM Causey, Charlestine Massed, NP [facility] None  09/13/2017  9:30 AM [facility] INFUSION [facility] None    No orders of the defined types were placed in this encounter.      Subjective:   Patient ID:  Kathryn Ramsey is a 52 y.o. (DOB May 23, 1965) female.  Chief Complaint: No chief complaint on file.   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  Patient reports having episodic right throat pain for the last 3 weeks with her pain increasing with swallowing.  She had chemotherapy last Tuesday and noted that the pain in her right throat increased with severity that evening.  She also reports nasal congestion, postnasal drainage and maxillary sinus pressure.  She has previously been seen by Dr. Jana Hakim who told her to decrease her use of Claritin and to attempt to decrease nose blowing.   She denies fevers, chills, sweats and has no productive cough.  She will be leaving for a trip to [place] and [place] next week.  Medications: I have reviewed the patient's current medications.  Allergies:  Allergies  Allergen Reactions  . Bee Venom Shortness Of Breath    swelling  . Compazine [Prochlorperazine Edisylate] Anaphylaxis, Anxiety and Palpitations  . Sulfa Antibiotics Shortness Of Breath    Vomit , diarrhea , hives  . Iodinated Diagnostic Agents Other (See Comments)    01/27/17 Pt reports sneezing and congestion. Pt states she takes claritin as a premed for CT scan in [place]. Premedicated with Benadryl 25 mg PO. Post CT,no sneezing but had mild drainage/congestion which resolved after a few minutes with oral fluids.    Past Medical History:  Diagnosis Date  . Acute sensory neuropathy 06/26/2015  . Allergy   . Anemia   . Asthma    illness induced asthma  . Blood transfusion without reported diagnosis    at age 19 years old D/T surgery to femur being crushed  . 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 Heart murmur    pt, states had a "working heart murmur"  . History of kidney stones   . PONV (postoperative nausea and vomiting)   . Skin cancer     Past Surgical History:  Procedure Laterality Date  . 3 laporscopic proceedures    . ABDOMINAL HYSTERECTOMY     2010  . CHOLECYSTECTOMY  2010  . [place]  . LAPAROSCOPY N/A 03/27/2015   Procedure: DIAGNOSTIC LAPAROSCOPY ;  Surgeon: [name], MD;  Location: [facility];  Service: Gynecology;  Laterality: N/A;  . LAPAROSCOPY N/A 02/24/2016   Procedure: LAPAROSCOPY DIAGNOSTIC WITH PERITONEAL WASHINGS AND PERITONEAL BIOPSY;  Surgeon: [name], MD;  Location: [facility];  Service: Gynecology;  Laterality: N/A;  . LAPAROTOMY N/A 03/27/2015   Procedure: EXPLORATORY LAPAROTOMY, BILATERAL SALPINGO OOPHORECTOMY, OMENTECTOMY, RADICAL  TUMOR DEBULKING;  Surgeon: [name], MD;  Location: [facility];  Service: Gynecology;  Laterality: N/A;  . sinus surgery 1994      Family History  Problem Relation Age of Onset  . Hypertension Father   . Skin cancer Father        nonmelanoma skin cancers in his late 78s  . Non-Hodgkin's lymphoma Maternal Aunt        dx. 58s; smoker  . Stroke Maternal Grandmother   . Other Mother        benign meningioma dx. early-mid-70s; hysterectomy in her late 38s for heavy periods - still has ovaries  . Other Son        one son with pre-cancerous skin findings  . Other Daughter        cysts on ovaries and hx of heavy periods  . Other Sister        hysterectomy for cysts - still has ovaries  . Heart attack Maternal Grandfather   . Heart attack Paternal Grandfather   . Renal cancer Maternal Aunt 60       smoker  . Breast cancer Maternal Aunt 78  . Other Maternal Aunt        dx. benign brain tumor (meningioma) at 5; 2nd benign brain tumor in her late 50s; hx of radical hysterectomy at age 53  . Brain cancer Other        NOS tumor    Social History   Socioeconomic History  . Marital status: Married    Spouse name: Not on file  . Number of children: 4  . Years of education: 28  . Highest education level: Not on file  Occupational History  . Occupation: Biomedical scientist  Social Needs  . Financial resource strain: Not on file  . Food insecurity:    Worry: Not on file    Inability: Not on file  . Transportation needs:    Medical: Not on file    Non-medical: Not on file  Tobacco Use  . Smoking status: Never Smoker  . Smokeless tobacco: Never Used  Substance and Sexual Activity  . Alcohol use: Yes    Comment:  3 glasses wine or beer/week  . Drug use: No  . Sexual activity: Not on file  Lifestyle  . Physical activity:    Days per week: Not on file    Minutes per session: Not on file  . Stress: Not on file  Relationships  . Social connections:    Talks on phone: Not on file    Gets  together: Not on file    Attends religious service: Not on file    Active member of club or organization: Not on file    Attends meetings of clubs or organizations: Not on file    Relationship status: Not on file  . Intimate partner violence:    Fear of current or ex partner: Not on file    Emotionally abused: Not on file    Physically abused:  Not on file    Forced sexual activity: Not on file  Other Topics Concern  . Not on file  Social History Narrative   Lives at home w/ husband and children   Right-handed   3 cups caffeine per day    Past Medical History, Surgical history, Social history, and Family history were reviewed and updated as appropriate.   Please see review of systems for further details on the patient's review from today.   Review of Systems:  Review of Systems  Constitutional: Negative for chills, diaphoresis and fever.  HENT: Positive for postnasal drip, sinus pressure, sinus pain and sore throat. Negative for congestion, rhinorrhea and sneezing.   Respiratory: Negative for cough and shortness of breath.   Neurological: Negative for headaches.    Objective:   Physical Exam:  There were no vitals taken for this visit. ECOG: 0  Physical Exam  Constitutional: No distress.  HENT:  Head: Normocephalic and atraumatic.  Nose: Right sinus exhibits maxillary sinus tenderness. Right sinus exhibits no frontal sinus tenderness. Left sinus exhibits maxillary sinus tenderness. Left sinus exhibits no frontal sinus tenderness.  Mouth/Throat: No oropharyngeal exudate.  Cardiovascular: Normal rate, regular rhythm and normal heart sounds. Exam reveals no gallop and no friction rub.  No murmur heard. Pulmonary/Chest: Effort normal and breath sounds normal. No respiratory distress. She has no wheezes. She has no rales.  Neurological: She is alert.  Skin: Skin is warm and dry. No rash noted. She is not diaphoretic. No erythema.    Lab Review:     Component Value  Date/Time   NA 138 08/16/2017 1120   NA 140 02/11/2017 0755   K 4.1 08/16/2017 1120   K 4.4 02/11/2017 0755   CL 104 08/16/2017 1120   CO2 28 08/16/2017 1120   CO2 26 02/11/2017 0755   GLUCOSE 82 08/16/2017 1120   GLUCOSE 93 02/11/2017 0755   BUN 5 (L) 08/16/2017 1120   BUN 7.9 02/11/2017 0755   CREATININE 0.69 08/16/2017 1120   CREATININE 0.72 03/25/2017 0832   CREATININE 0.7 02/11/2017 0755   CALCIUM 9.5 08/16/2017 1120   CALCIUM 9.6 02/11/2017 0755   PROT 7.4 08/16/2017 1120   PROT 7.5 02/11/2017 0755   ALBUMIN 4.5 08/16/2017 1120   ALBUMIN 4.4 02/11/2017 0755   AST 19 08/16/2017 1120   AST 74 (H) 03/25/2017 0832   AST 27 02/11/2017 0755   ALT 19 08/16/2017 1120   ALT 155 (H) 03/25/2017 0832   ALT 24 02/11/2017 0755   ALKPHOS 72 08/16/2017 1120   ALKPHOS 58 02/11/2017 0755   BILITOT 0.6 08/16/2017 1120   BILITOT 0.4 03/25/2017 0832   BILITOT 0.43 02/11/2017 0755   GFRNONAA >60 08/16/2017 1120   GFRNONAA >60 03/25/2017 0832   GFRAA >60 08/16/2017 1120   GFRAA >60 03/25/2017 0832       Component Value Date/Time   WBC 3.7 (L) 08/16/2017 1120   RBC 3.11 (L) 08/16/2017 1120   HGB 10.5 (L) 08/16/2017 1120   HGB 11.2 (L) 05/06/2017 0925   HGB 12.1 02/11/2017 0755   HCT 32.1 (L) 08/16/2017 1120   HCT 35.8 02/11/2017 0755   PLT 218 08/16/2017 1120   PLT 188 05/06/2017 0925   PLT 151 02/11/2017 0755   MCV 103.2 (H) 08/16/2017 1120   MCV 103.5 (H) 02/11/2017 0755   MCH 33.8 08/16/2017 1120   MCHC 32.7 08/16/2017 1120   RDW 16.4 (H) 08/16/2017 1120   RDW 12.4 02/11/2017 0755   LYMPHSABS  1.4 08/16/2017 1120   LYMPHSABS 1.7 02/11/2017 0755   MONOABS 0.1 08/16/2017 1120   MONOABS 0.1 02/11/2017 0755   EOSABS 0.0 08/16/2017 1120   EOSABS 0.0 02/11/2017 0755   BASOSABS 0.0 08/16/2017 1120   BASOSABS 0.0 02/11/2017 0755   -------------------------------  Imaging from last 24 hours (if applicable):  Radiology interpretation: No results found.

## 2017-08-17 NOTE — Patient Instructions (Signed)
Pegfilgrastim injection What is this medicine? PEGFILGRASTIM (PEG fil gra stim) is a long-acting granulocyte colony-stimulating factor that stimulates the growth of neutrophils, a type of white blood cell important in the body's fight against infection. It is used to reduce the incidence of fever and infection in patients with certain types of cancer who are receiving chemotherapy that affects the bone marrow, and to increase survival after being exposed to high doses of radiation. This medicine may be used for other purposes; ask your health care provider or pharmacist if you have questions. COMMON BRAND NAME(S): Neulasta What should I tell my health care provider before I take this medicine? They need to know if you have any of these conditions: -kidney disease -latex allergy -ongoing radiation therapy -sickle cell disease -skin reactions to acrylic adhesives (On-Body Injector only) -an unusual or allergic reaction to pegfilgrastim, filgrastim, other medicines, foods, dyes, or preservatives -pregnant or trying to get pregnant -breast-feeding How should I use this medicine? This medicine is for injection under the skin. If you get this medicine at home, you will be taught how to prepare and give the pre-filled syringe or how to use the On-body Injector. Refer to the patient Instructions for Use for detailed instructions. Use exactly as directed. Tell your healthcare provider immediately if you suspect that the On-body Injector may not have performed as intended or if you suspect the use of the On-body Injector resulted in a missed or partial dose. It is important that you put your used needles and syringes in a special sharps container. Do not put them in a trash can. If you do not have a sharps container, call your pharmacist or healthcare provider to get one. Talk to your pediatrician regarding the use of this medicine in children. While this drug may be prescribed for selected conditions,  precautions do apply. Overdosage: If you think you have taken too much of this medicine contact a poison control center or emergency room at once. NOTE: This medicine is only for you. Do not share this medicine with others. What if I miss a dose? It is important not to miss your dose. Call your doctor or health care professional if you miss your dose. If you miss a dose due to an On-body Injector failure or leakage, a new dose should be administered as soon as possible using a single prefilled syringe for manual use. What may interact with this medicine? Interactions have not been studied. Give your health care provider a list of all the medicines, herbs, non-prescription drugs, or dietary supplements you use. Also tell them if you smoke, drink alcohol, or use illegal drugs. Some items may interact with your medicine. This list may not describe all possible interactions. Give your health care provider a list of all the medicines, herbs, non-prescription drugs, or dietary supplements you use. Also tell them if you smoke, drink alcohol, or use illegal drugs. Some items may interact with your medicine. What should I watch for while using this medicine? You may need blood work done while you are taking this medicine. If you are going to need a MRI, CT scan, or other procedure, tell your doctor that you are using this medicine (On-Body Injector only). What side effects may I notice from receiving this medicine? Side effects that you should report to your doctor or health care professional as soon as possible: -allergic reactions like skin rash, itching or hives, swelling of the face, lips, or tongue -dizziness -fever -pain, redness, or irritation at site   where injected -pinpoint red spots on the skin -red or dark-brown urine -shortness of breath or breathing problems -stomach or side pain, or pain at the shoulder -swelling -tiredness -trouble passing urine or change in the amount of urine Side  effects that usually do not require medical attention (report to your doctor or health care professional if they continue or are bothersome): -bone pain -muscle pain This list may not describe all possible side effects. Call your doctor for medical advice about side effects. You may report side effects to FDA at 1-800-FDA-1088. Where should I keep my medicine? Keep out of the reach of children. Store pre-filled syringes in a refrigerator between 2 and 8 degrees C (36 and 46 degrees F). Do not freeze. Keep in carton to protect from light. Throw away this medicine if it is left out of the refrigerator for more than 48 hours. Throw away any unused medicine after the expiration date. NOTE: This sheet is a summary. It may not cover all possible information. If you have questions about this medicine, talk to your doctor, pharmacist, or health care provider.  2018 Elsevier/Gold Standard (2016-01-29 12:58:03)  

## 2017-08-19 ENCOUNTER — Telehealth: Payer: Self-pay

## 2017-08-19 NOTE — Telephone Encounter (Signed)
Returned patient's call regarding wanting lab results for CA125.  Left message for patient to call back at her convenience for results.

## 2017-08-22 ENCOUNTER — Encounter: Payer: Self-pay | Admitting: Physical Therapy

## 2017-08-22 ENCOUNTER — Ambulatory Visit: Payer: 59 | Attending: Oncology | Admitting: Physical Therapy

## 2017-08-22 DIAGNOSIS — M62838 Other muscle spasm: Secondary | ICD-10-CM | POA: Diagnosis present

## 2017-08-22 DIAGNOSIS — M6281 Muscle weakness (generalized): Secondary | ICD-10-CM | POA: Insufficient documentation

## 2017-08-22 NOTE — Therapy (Addendum)
[facility] [address], [place], [place], [address] Phone: [phone]   Fax:  [phone]  Physical Therapy Treatment  Patient Details  Name: Kathryn Ramsey MRN: [id number] Date of Birth: 02-27-1965 Referring Provider: Dr. Lurline Del   Encounter Date: 08/22/2017  PT End of Session - 08/22/17 1624    Visit Number  11    Date for PT Re-Evaluation  10/06/17    Authorization Type  [employer]    Authorization - Visit Number  11    Authorization - Number of Visits  23    PT Start Time  2330    PT Stop Time  1620    PT Time Calculation (min)  50 min    Activity Tolerance  Patient tolerated treatment well    Behavior During Therapy  [facility] for tasks assessed/performed       Past Medical History:  Diagnosis Date  . Acute sensory neuropathy 06/26/2015  . Allergy   . Anemia   . Asthma    illness induced asthma  . Blood transfusion without reported diagnosis    at age 26 years old D/T surgery to femur being crushed  . 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 Heart murmur    pt, states had a "working heart murmur"  . History of kidney stones   . PONV (postoperative nausea and vomiting)   . Skin cancer     Past Surgical History:  Procedure Laterality Date  . 3 laporscopic proceedures    . ABDOMINAL HYSTERECTOMY     2010  . CHOLECYSTECTOMY     2010  . DILATION AND CURETTAGE OF UTERUS     1997  . LAPAROSCOPY N/A 03/27/2015   Procedure: DIAGNOSTIC LAPAROSCOPY ;  Surgeon: [name], MD;  Location: [facility];  Service: Gynecology;  Laterality: N/A;  . LAPAROSCOPY N/A 02/24/2016   Procedure: LAPAROSCOPY DIAGNOSTIC WITH PERITONEAL WASHINGS AND PERITONEAL BIOPSY;  Surgeon: [name], MD;  Location: [facility];  Service: Gynecology;  Laterality: N/A;  . LAPAROTOMY N/A 03/27/2015   Procedure: EXPLORATORY LAPAROTOMY, BILATERAL SALPINGO OOPHORECTOMY, OMENTECTOMY,  RADICAL TUMOR DEBULKING;  Surgeon: [name], MD;  Location: [facility];  Service: Gynecology;  Laterality: N/A;  . sinus surgery 1994      There were no vitals filed for this visit.  Subjective Assessment - 08/22/17 1533    Subjective  I switched my medicine.  the new medication has been working. My levels have decreased. I did not have therapy due to chemotherapy. I am leaving for [place] tomorrow.  I am feeling constipated. My bloating is down.     Patient Stated Goals  reduce abdominal discomfort    Currently in Pain?  Yes    Pain Score  2     Pain Location  Abdomen    Pain Orientation  Mid;Lower    Pain Descriptors / Indicators  Pressure    Pain Type  Acute pain    Pain Onset  More than a month ago    Pain Frequency  Constant    Aggravating Factors   foods, stages of chemotherapy    Pain Relieving Factors  massage    Multiple Pain Sites  No         [facility] PT Assessment - 08/22/17 0001      Observation/Other Assessments-Edema    Edema  Circumferential around abdomen at umbilicus prior to tx,  79 cm after 74.5                   [facility] Adult PT Treatment/Exercise - 08/22/17 0001      Manual Therapy   Manual Therapy  Myofascial release;Soft tissue mobilization    Soft tissue mobilization  circular massage around the umbilicus    Myofascial Release  respiratory, umbilicus  and urogenital diaphgram going through the 3 planes of fascia               PT Short Term Goals - 05/12/17 1143      PT SHORT TERM GOAL #1   Title  independent with scar massage to improve tissue mobility    Time  4    Period  Weeks    Status  Achieved      PT SHORT TERM GOAL #2   Title  understand how to perform abdominal massage to promote peristalic mobility to improve constipation    Time  4    Period  Weeks    Status  Achieved        PT Long Term Goals - 08/22/17 1538      PT LONG TERM GOAL #1   Title  independent with HEP    Baseline  still learning    Time  4     Period  Months    Status  On-going      PT LONG TERM GOAL #2   Title  reduction of abdominal pain as she is standing due tp reduction of fascial tightness    Baseline  reduced by 40% due to level are down with new chemotherapy treatment    Time  12    Period  Weeks    Status  On-going      PT LONG TERM GOAL #3   Title  mobility of abdominal scars improved >/= 75% so patient is able to contract lower abdominals     Time  4    Period  Months    Status  Achieved      PT LONG TERM GOAL #4   Title  abdominal circumference is consistently 75 cm due to reduction of swelling    Baseline  79 cm    Time  12    Period  Weeks    Status  On-going            Plan - 08/22/17 1625    Clinical Impression Statement  Patient has not come to physical therapy for 1 month due to changing her chemotherapy and her reactions to the chemotherapy.  Patient abdominal circumference is 79 cm prior to treatement and 74.5 cm afterwards.   Patient abdomen felt firm prior to treatment and afterwards was softer.  Patient is feeling fatiqued with her treatments.  She is leaving on vacation till the end of the month then will resume physical therapy. Patient will benefit from skilled therapy to improve tissue mobility and reduce discomfort.     Rehab Potential  Excellent    Clinical Impairments Affecting Rehab Potential  recurrent cancer with last episode 02/04/2017; presently taking chemotherapy    PT Frequency  2x / week    PT Duration  12 weeks    PT Treatment/Interventions  Therapeutic activities;Therapeutic exercise;Patient/family education;Neuromuscular re-education;Manual techniques;Scar mobilization;Dry needling    PT Next Visit Plan  work around the umbilicus in quadruped; possible internal release    PT Home Exercise Plan  progress as needed    Recommended Other Services  MD signed renewal note for 07/06/2017    Consulted and Agree with Plan of Care  Patient       Patient will benefit from skilled  therapeutic intervention in order to improve the following deficits and impairments:  Pain, Increased fascial restricitons, Increased muscle spasms, Decreased activity tolerance  Visit Diagnosis: Other muscle spasm  Muscle weakness (generalized)     Problem List Patient Active Problem List   Diagnosis Date Noted  . Diarrhea 08/02/2017  . Lichen sclerosus et atrophicus of the vulva 03/31/2016  . Abdominal wall mass of right lower quadrant 03/08/2016  . 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 Goals of care, counseling/discussion 02/27/2016  . Elevated CA-125 02/24/2016  . Vaginal dryness, menopausal 10/01/2015  . Drug-induced peripheral neuropathy ([place]) 09/02/2015  . Leukopenia due to antineoplastic chemotherapy ([place]) 09/02/2015  . Chemotherapy-induced neuropathy ([place]) 08/19/2015  . Cellulitis 07/03/2015  . Acute sensory neuropathy 06/26/2015  . High risk medication use 06/21/2015  . Facial paresthesia 06/18/2015  . Genetic testing 06/16/2015  . Restless legs 05/14/2015  . Family history of breast cancer in female 04/29/2015  . Chemotherapy-induced nausea 04/22/2015  . Chemotherapy induced neutropenia ([place]) 04/22/2015  . 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 History of hysterectomy for benign disease 03/21/2015  . IBS (irritable bowel syndrome) 03/21/2015  . Dense breast tissue 03/21/2015  . History of cholecystectomy 03/21/2015  . Abdominal carcinomatosis ([place]) 03/21/2015    Earlie Counts, PT 08/22/17 4:29 PM   [facility] [address] [place], [place], [address] Phone: [phone]   Fax:  [phone]  Name: Kathryn Ramsey MRN: [id number] Date of Birth: 06-19-1965  PHYSICAL THERAPY DISCHARGE SUMMARY  Visits from Start of Care: 11  Current functional level related to goals / functional outcomes: See above.   Remaining deficits: See above.    Education / Equipment: HEP Plan:                                                     Patient goals were partially met. Patient is being discharged due to not returning since the last visit.  Thank you for the referral. Earlie Counts, PT 11/08/17 12:16 PM '????

## 2017-09-12 ENCOUNTER — Inpatient Hospital Stay: Payer: 59

## 2017-09-12 DIAGNOSIS — C762 Malignant neoplasm of abdomen: Secondary | ICD-10-CM

## 2017-09-12 DIAGNOSIS — Z5111 Encounter for antineoplastic chemotherapy: Secondary | ICD-10-CM | POA: Diagnosis not present

## 2017-09-12 DIAGNOSIS — C801 Malignant (primary) neoplasm, unspecified: Secondary | ICD-10-CM

## 2017-09-12 DIAGNOSIS — C5701 Malignant neoplasm of right fallopian tube: Secondary | ICD-10-CM

## 2017-09-12 DIAGNOSIS — C786 Secondary malignant neoplasm of retroperitoneum and peritoneum: Secondary | ICD-10-CM

## 2017-09-12 LAB — COMPREHENSIVE METABOLIC PANEL
ALT: 19 U/L (ref 0–44)
AST: 23 U/L (ref 15–41)
Albumin: 4.5 g/dL (ref 3.5–5.0)
Alkaline Phosphatase: 68 U/L (ref 38–126)
Anion gap: 7 (ref 5–15)
BUN: 12 mg/dL (ref 6–20)
CO2: 28 mmol/L (ref 22–32)
Calcium: 9.5 mg/dL (ref 8.9–10.3)
Chloride: 101 mmol/L (ref 98–111)
Creatinine, Ser: 0.72 mg/dL (ref 0.44–1.00)
GFR calc Af Amer: >60 mL/min (ref >60)
GFR calc non Af Amer: >60 mL/min (ref >60)
Glucose, Bld: 80 mg/dL (ref 70–99)
Potassium: 4.3 mmol/L (ref 3.5–5.1)
Sodium: 136 mmol/L (ref 135–145)
Total Bilirubin: 0.5 mg/dL (ref 0.3–1.2)
Total Protein: 7.7 g/dL (ref 6.5–8.1)

## 2017-09-12 LAB — CBC WITH DIFFERENTIAL/PLATELET
Basophils Absolute: 0 10*3/uL (ref 0.0–0.1)
Basophils Relative: 1 %
Eosinophils Absolute: 0 10*3/uL (ref 0.0–0.5)
Eosinophils Relative: 0 %
HCT: 33.8 % — ABNORMAL LOW (ref 34.8–46.6)
Hemoglobin: 11.4 g/dL — ABNORMAL LOW (ref 11.6–15.9)
Lymphocytes Relative: 22 %
Lymphs Abs: 1.1 10*3/uL (ref 0.9–3.3)
MCH: 35.1 pg — ABNORMAL HIGH (ref 25.1–34.0)
MCHC: 33.6 g/dL (ref 31.5–36.0)
MCV: 104.5 fL — ABNORMAL HIGH (ref 79.5–101.0)
Monocytes Absolute: 0.4 10*3/uL (ref 0.1–0.9)
Monocytes Relative: 7 %
Neutro Abs: 3.4 10*3/uL (ref 1.5–6.5)
Neutrophils Relative %: 70 %
Platelets: 229 10*3/uL (ref 145–400)
RBC: 3.23 MIL/uL — ABNORMAL LOW (ref 3.70–5.45)
RDW: 14.7 % — ABNORMAL HIGH (ref 11.2–14.5)
WBC: 4.9 10*3/uL (ref 3.9–10.3)

## 2017-09-12 LAB — TOTAL PROTEIN, URINE DIPSTICK: Protein, ur: NEGATIVE mg/dL

## 2017-09-13 ENCOUNTER — Inpatient Hospital Stay: Payer: 59

## 2017-09-13 ENCOUNTER — Encounter: Payer: Self-pay | Admitting: Adult Health

## 2017-09-13 ENCOUNTER — Telehealth: Payer: Self-pay | Admitting: Adult Health

## 2017-09-13 ENCOUNTER — Inpatient Hospital Stay (HOSPITAL_BASED_OUTPATIENT_CLINIC_OR_DEPARTMENT_OTHER): Payer: 59 | Admitting: Adult Health

## 2017-09-13 ENCOUNTER — Other Ambulatory Visit: Payer: Self-pay | Admitting: Hematology and Oncology

## 2017-09-13 VITALS — BP 125/68 | HR 55 | Temp 97.8°F | Resp 18 | Ht 63.0 in | Wt 120.8 lb

## 2017-09-13 DIAGNOSIS — G620 Drug-induced polyneuropathy: Secondary | ICD-10-CM

## 2017-09-13 DIAGNOSIS — C801 Malignant (primary) neoplasm, unspecified: Principal | ICD-10-CM

## 2017-09-13 DIAGNOSIS — C5701 Malignant neoplasm of right fallopian tube: Secondary | ICD-10-CM

## 2017-09-13 DIAGNOSIS — C786 Secondary malignant neoplasm of retroperitoneum and peritoneum: Secondary | ICD-10-CM

## 2017-09-13 DIAGNOSIS — C762 Malignant neoplasm of abdomen: Secondary | ICD-10-CM

## 2017-09-13 DIAGNOSIS — Z7189 Other specified counseling: Secondary | ICD-10-CM

## 2017-09-13 DIAGNOSIS — Z5111 Encounter for antineoplastic chemotherapy: Secondary | ICD-10-CM | POA: Diagnosis not present

## 2017-09-13 LAB — CA 125: Cancer Antigen (CA) 125: 48.9 U/mL — ABNORMAL HIGH (ref 0.0–38.1)

## 2017-09-13 MED ORDER — SODIUM CHLORIDE 0.9 % IV SOLN
Freq: Once | INTRAVENOUS | Status: AC
  Administered 2017-09-13: 10:00:00 via INTRAVENOUS
  Filled 2017-09-13: qty 250

## 2017-09-13 MED ORDER — ONDANSETRON HCL 8 MG PO TABS
ORAL_TABLET | ORAL | Status: AC
  Filled 2017-09-13: qty 1

## 2017-09-13 MED ORDER — ONDANSETRON HCL 8 MG PO TABS
8.0000 mg | ORAL_TABLET | Freq: Once | ORAL | Status: AC
  Administered 2017-09-13: 8 mg via ORAL

## 2017-09-13 MED ORDER — PACLITAXEL PROTEIN-BOUND CHEMO INJECTION 100 MG
100.0000 mg/m2 | Freq: Once | Status: AC
  Administered 2017-09-13: 150 mg via INTRAVENOUS
  Filled 2017-09-13: qty 30

## 2017-09-13 NOTE — Progress Notes (Signed)
[place]  Telephone:(336) [phone] Fax:(336) [phone]     ID: Kathryn Ramsey DOB: [birth year]  MR#: [id number]  TFT#:[id number]  Patient Care Team: Harlan Stains, MD as PCP - General (Family Medicine) [name], MD as Consulting Physician (Obstetrics and Gynecology) Juanita Craver, MD as Consulting Physician (Gastroenterology) Servando Salina, MD as Consulting Physician (Obstetrics and Gynecology) Nickie Retort, MD as Consulting Physician (Urology) Gillis Ends, MD as Referring Physician (Obstetrics and Gynecology) OTHER MD: Festus Aloe [phone])  CHIEF COMPLAINT:  ovarian cancer  CURRENT TREATMENT: Abraxane  INTERVAL HISTORY: Kathryn Ramsey returns today for a follow-up and treatment of her recurrent ovarian cancer accompanied by her sister Kathryn Ramsey, and husband. She is currently receiving Abraxane, given days 1, 8,  of each 21-day cycle, with today being day 1 cycle 3.  She receives [place] on day 9 of each cycle.  She is tolerating treatment well.  Her CA 125 continues to fall and is 48.9 today.    REVIEW OF SYSTEMS: Kathryn Ramsey is doing well today.  She just returned from a trip to the beach. She has mild intermittent neuropathy that she notes in her shins.  She denies any issues in her fingertips or toes.  She is feeling well today.  She wants to make sure that she is receiving PO zofran prior to chemotherapy today.    She notes some occasional pelvic cramping and pressure that occurs from day to day.    Otherwise Kathryn Ramsey is feeling well.  She denies fevers, chills, chest pain, palpitations, shortness of breath, or any other issues.  A detailed ROS was otherwise non contributory.    HISTORY OF PRESENT ILLNESS: From Dr. Serita Grit original intake note 03/17/2015:  "Kathryn Ramsey is a very pleasant G4P4 who is seen in consultation at the request of Dr Collene Mares and Dr Garwin Brothers for peritoneal carcinomatosis. The patient has a history of a workup by urology for  gross hematuria which included a pelvic examine was suggested for extrinsic mass effect on the bladder on cystoscopy. Cystoscopy took place on in December 2016. The patient denies abdominal pain bloating early satiety or abdominal distention.  On 08/03/2015 at [facility] she underwent a CT scan of the abdomen and pelvis as ordered by Dr Baruch Gouty. This revealed a 1.4 cm low attenuation lesion on the posterior right hepatic lobe suggestive of a hemangioma. Status post hysterectomy. No ovarian masses. Moderate ascites. Omental caking seen in the lateral left abdomen and pelvis. Peritoneal nodularity in the left lateral pelvic cul-de-sac. No gross extrinsic compression on the bladder was identified on imaging.  The patient was then seen by her gastroenterologist, Dr. Collene Mares, who performed a colonoscopy which was unremarkable.  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 She was treated adjuvantly with carboplatin and paclitaxel, as detailed below. Her CA 125 dropped from 1226 on 03/20/2015 to 26.1 by 06/16/2015.  Her subsequent history is as detailed below.  I PAST MEDICAL HISTORY: Past Medical History:  Diagnosis Date  . Acute sensory neuropathy 06/26/2015  . Allergy   . Anemia   . Asthma    illness induced asthma  . Blood transfusion without reported diagnosis    at age 78 years old D/T  surgery to femur being crushed  . 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 Heart murmur    pt, states had a "working heart murmur"  . History of kidney stones   . PONV (postoperative nausea  and vomiting)   . Skin cancer     PAST SURGICAL HISTORY: Past Surgical History:  Procedure Laterality Date  . 3 laporscopic proceedures    . ABDOMINAL HYSTERECTOMY     2010  . CHOLECYSTECTOMY     2010  . DILATION AND CURETTAGE OF UTERUS     1997  . LAPAROSCOPY N/A 03/27/2015   Procedure: DIAGNOSTIC LAPAROSCOPY ;  Surgeon: [name], MD;  Location: [facility];  Service: Gynecology;  Laterality: N/A;  . LAPAROSCOPY N/A 02/24/2016   Procedure: LAPAROSCOPY DIAGNOSTIC WITH PERITONEAL WASHINGS AND PERITONEAL BIOPSY;  Surgeon: [name], MD;  Location: [facility];  Service: Gynecology;  Laterality: N/A;  . LAPAROTOMY N/A 03/27/2015   Procedure: EXPLORATORY LAPAROTOMY, BILATERAL SALPINGO OOPHORECTOMY, OMENTECTOMY, RADICAL TUMOR DEBULKING;  Surgeon: [name], MD;  Location: [facility];  Service: Gynecology;  Laterality: N/A;  . sinus surgery 1994      FAMILY HISTORY Family History  Problem Relation Age of Onset  . Hypertension Father   . Skin cancer Father        nonmelanoma skin cancers in his late 29s  . Non-Hodgkin's lymphoma Maternal Aunt        dx. 46s; smoker  . Stroke Maternal Grandmother   . Other Mother        benign meningioma dx. early-mid-70s; hysterectomy in her late 57s for heavy periods - still has ovaries  . Other Son        one son with pre-cancerous skin findings  . Other Daughter        cysts on ovaries and hx of heavy periods  . Other Sister        hysterectomy for cysts - still has ovaries  . Heart attack Maternal Grandfather   . Heart attack Paternal Grandfather   . Renal cancer Maternal Aunt 60       smoker  . Breast cancer Maternal Aunt 78  . Other Maternal Aunt        dx. benign brain tumor (meningioma) at 56; 2nd benign brain tumor in her late 25s; hx of radical hysterectomy at age 55  . Brain cancer Other        NOS tumor  The patient's parents are both living, in their late 41s as of January 2018. The patient has one brother, 3 sisters. There is no history of  ovarian cancer in the family. One maternal aunt had breast and kidney cancer, another had non-Hodgkin's lymphoma.  GYNECOLOGIC HISTORY:  No LMP recorded. Patient has had a hysterectomy. 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 Her husband [name] is a Engineer, maintenance (IT). Their children are 56, 19, 64 and 13 y/o as of JAN 2018. The oldest works as an Electrical engineer in the [employer] in [place]. The other 3 children live in [place], the youngest currently attending [employer]. The patient has no grandchildren. She attends [employer]    ADVANCED DIRECTIVES:    HEALTH MAINTENANCE: Social History   Tobacco Use  . Smoking status: Never Smoker  . Smokeless tobacco: Never Used  Substance Use  Topics  . Alcohol use: Yes    Comment:  3 glasses wine or beer/week  . Drug use: No     Colonoscopy:2016  PAP: s/p hyst  Bone density:   Allergies  Allergen Reactions  . Bee Venom Shortness Of Breath    swelling  . Compazine [Prochlorperazine Edisylate] Anaphylaxis, Anxiety and Palpitations  . Sulfa Antibiotics Shortness Of Breath    Vomit , diarrhea , hives  . Iodinated Diagnostic Agents Other (See Comments)    01/27/17 Pt reports sneezing and congestion. Pt states she takes claritin as a premed for CT scan in [place]. Premedicated with Benadryl 25 mg PO. Post CT,no sneezing but had mild drainage/congestion which resolved after a few minutes with oral fluids.    Current Outpatient Medications  Medication Sig Dispense Refill  . fluticasone (FLONASE) 50 MCG/ACT nasal spray Place 1 spray into both nostrils 2 (two) times daily. 16 g 2  . loratadine (CLARITIN) 10 MG tablet Take 10 mg by mouth daily.    . prochlorperazine (COMPAZINE) 10 MG tablet Take 1 tablet (10 mg total) by mouth every 6 (six) hours as needed (Nausea or vomiting). (Patient not taking: Reported on 09/13/2017) 30 tablet 1   No current  facility-administered medications for this visit.     OBJECTIVE:  Vitals:   09/13/17 0902  BP: 125/68  Pulse: (!) 55  Resp: 18  Temp: 97.8 F (36.6 C)  SpO2: 100%     Body mass index is 21.4 kg/m.   ECOG FS:1 - Symptomatic but completely ambulatory GENERAL: Patient is a well appearing female in no acute distress HEENT:  Sclerae anicteric.  Oropharynx clear and moist. No ulcerations or evidence of oropharyngeal candidiasis. Neck is supple.  NODES:  No cervical, supraclavicular, or axillary lymphadenopathy palpated.  BREAST EXAM:  Deferred. LUNGS:  Clear to auscultation bilaterally.  No wheezes or rhonchi. HEART:  Regular rate and rhythm. No murmur appreciated. ABDOMEN:  Soft, nontender.  Positive, normoactive bowel sounds. No organomegaly palpated. MSK:  No focal spinal tenderness to palpation. Full range of motion bilaterally in the upper extremities. EXTREMITIES:  No peripheral edema.   SKIN:  Clear with no obvious rashes or skin changes. No nail dyscrasia. NEURO:  Nonfocal. Well oriented.  Appropriate affect.     LAB RESULTS:  CMP     Component Value Date/Time   NA 136 09/12/2017 1125   NA 140 02/11/2017 0755   K 4.3 09/12/2017 1125   K 4.4 02/11/2017 0755   CL 101 09/12/2017 1125   CO2 28 09/12/2017 1125   CO2 26 02/11/2017 0755   GLUCOSE 80 09/12/2017 1125   GLUCOSE 93 02/11/2017 0755   BUN 12 09/12/2017 1125   BUN 7.9 02/11/2017 0755   CREATININE 0.72 09/12/2017 1125   CREATININE 0.72 03/25/2017 0832   CREATININE 0.7 02/11/2017 0755   CALCIUM 9.5 09/12/2017 1125   CALCIUM 9.6 02/11/2017 0755   PROT 7.7 09/12/2017 1125   PROT 7.5 02/11/2017 0755   ALBUMIN 4.5 09/12/2017 1125   ALBUMIN 4.4 02/11/2017 0755   AST 23 09/12/2017 1125   AST 74 (H) 03/25/2017 0832   AST 27 02/11/2017 0755   ALT 19 09/12/2017 1125   ALT 155 (H) 03/25/2017 0832   ALT 24 02/11/2017 0755   ALKPHOS 68 09/12/2017 1125   ALKPHOS 58 02/11/2017 0755   BILITOT 0.5 09/12/2017 1125    BILITOT 0.4 03/25/2017 0832   BILITOT 0.43 02/11/2017 0755   GFRNONAA >60 09/12/2017 1125   GFRNONAA >  60 03/25/2017 0832   GFRAA >60 09/12/2017 1125   GFRAA >60 03/25/2017 0832    INo results found for: SPEP, UPEP  Lab Results  Component Value Date   WBC 4.9 09/12/2017   NEUTROABS 3.4 09/12/2017   HGB 11.4 (L) 09/12/2017   HCT 33.8 (L) 09/12/2017   MCV 104.5 (H) 09/12/2017   PLT 229 09/12/2017      Chemistry      Component Value Date/Time   NA 136 09/12/2017 1125   NA 140 02/11/2017 0755   K 4.3 09/12/2017 1125   K 4.4 02/11/2017 0755   CL 101 09/12/2017 1125   CO2 28 09/12/2017 1125   CO2 26 02/11/2017 0755   BUN 12 09/12/2017 1125   BUN 7.9 02/11/2017 0755   CREATININE 0.72 09/12/2017 1125   CREATININE 0.72 03/25/2017 0832   CREATININE 0.7 02/11/2017 0755      Component Value Date/Time   CALCIUM 9.5 09/12/2017 1125   CALCIUM 9.6 02/11/2017 0755   ALKPHOS 68 09/12/2017 1125   ALKPHOS 58 02/11/2017 0755   AST 23 09/12/2017 1125   AST 74 (H) 03/25/2017 0832   AST 27 02/11/2017 0755   ALT 19 09/12/2017 1125   ALT 155 (H) 03/25/2017 0832   ALT 24 02/11/2017 0755   BILITOT 0.5 09/12/2017 1125   BILITOT 0.4 03/25/2017 0832   BILITOT 0.43 02/11/2017 0755       No results found for: LABCA2  No components found for: LABCA125  No results for input(s): INR in the last 168 hours.  Urinalysis    Component Value Date/Time   COLORURINE STRAW (A) 04/08/2017 1123   APPEARANCEUR CLEAR 04/08/2017 1123   LABSPEC 1.003 (L) 04/08/2017 1123   LABSPEC 1.005 10/09/2015 1239   PHURINE 8.0 04/08/2017 1123   GLUCOSEU NEGATIVE 04/08/2017 1123   GLUCOSEU Negative 10/09/2015 1239   HGBUR NEGATIVE 04/08/2017 1123   BILIRUBINUR NEGATIVE 04/08/2017 1123   BILIRUBINUR Negative 10/09/2015 1239   KETONESUR NEGATIVE 04/08/2017 1123   PROTEINUR NEGATIVE 09/12/2017 1125   UROBILINOGEN 0.2 10/09/2015 1239   NITRITE NEGATIVE 04/08/2017 1123   LEUKOCYTESUR NEGATIVE 04/08/2017 1123     LEUKOCYTESUR Negative 10/09/2015 1239    STUDIES:  No results found.   ELIGIBLE FOR AVAILABLE RESEARCH PROTOCOL:  discussing protocols at [facility]: 52 y.o. BRCA negative  woman status post radical tumor debulking with optimal cytoreduction (R0) 03/27/2015 for a stage IIIC, high-grade right fallopian tube carcinoma  (a) baseline CA-125 was1226.  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)  (c) tumor is PD-L1 negative (02/24/2016)  (d) tumor is strongly estrogen receptor positive, progesterone receptor negative (02/24/2016)  (1) adjuvant chemotherapy consisted of carboplatin and paclitaxel for 6 doses, begun 04/14/2015, completed 08/11/2015  (a) paclitaxel was omitted from cycle 5 and dose reduced on cycle 6 because of neuropathy   (b) a "make up" dose of paclitaxel was given 08/25/2015  (c) last carboplatin dose was 08/11/2015  (d) CA-125 normalized by 06/16/2015   (2) FIRST RECURRENCE: January 2018  (a) CA-125 rise beginning November 2017 led to CT scans and PET scans December 2017, all negative  (b) exploratory laparotomy 02/24/2016 showed pathologically confirmed recurrence, with miliary disease involving all examined surfaces  (c) port-site metastases noted 03/08/2016  (3) carboplatin/liposomal doxorubicin started 03/05/2016, repeated every three weeks x4, completed 06/04/2016.  (a) cycle 3  delayed one week because of neutropenia; OnPro added  (b) CA 125 normalized after cycle 3  (4) RUCAPARIB maintenance started 07/02/2016  (a) restaging 10/01/2016: Normal CA 125, negative CT of the abdomen and pelvis  (b) labs 11/24/2016 shows a rise in her CA 125-43.4, with continuing rise thereafter  (c) rucaparib discontinued October 2018 with rising CA 125  (5) anastrozole started  December 23, 2016-stopped after a couple of weeks  SECOND RECURRENCE: (6) Gemcitabine/Bevacizumab started on 02/04/2017  (a) gemcitabine omitted cycle 4 because of intercurrent infection  (b) gemcitabine resumed with cycle 5, with intervening rise in  Ca 125  (c) repeat CT scan of the abdomen and pelvis on 05/26/2017 does not confirm obvious disease progression  (d) cycle 5 of gemcitabine/bevacizumab delayed 1 week   (e) gemcitabine/ bevacizumab discontinued after 6 cycles, with rising CA 125 (last dose 06/24/2017)  (7) foundation one testing in process  (8) Abraxane started 07/12/2017, repeated weekly x3 with neulasta day 16 first cycle  (a) cycle 2 started 08/09/2017, will be day 1 day 8 only  (b) cycle 3 scheduled to start 09/13/2017, day 1 day 8 at patient request  PLAN Yenny is doing well today.  I reviewed her labs with her today.  Her CA125 has dropped to 48.9 today.  This is great news.  She will proceed with Abraxane today, she is tolerating it well.  She will receive this on day 1 and day 8 with Udenyca on day 9.    Caycee is delighted her tumor marker has dropped.  This is good news.  I printed off her foundation one testing, and she will review this with Dr. Jana Hakim the next time she sees him.   I am unsure what the plan is for her in regards to scans and f/u.  I will review her case with him when he returns from the beach.  She will return in one week for labs and day 8 of Abraxane.    She knows to call for any other issues that may develop before the next visit.  A total of (30) minutes of face-to-face time was spent with this patient with greater than 50% of that time in counseling and care-coordination.   Wilber Bihari, NP  09/13/17 9:08 AM Medical Oncology and Hematology [facility] [address] [place], [place] [address] Tel. [phone]    Fax. [phone]

## 2017-09-13 NOTE — Telephone Encounter (Signed)
Per 7/30 los.  Gave patient avs and calendar.

## 2017-09-13 NOTE — Patient Instructions (Signed)
[place] [facility] Discharge Instructions for Patients Receiving Chemotherapy  Today you received the following chemotherapy agents: Paclitaxel-protein bound (Abraxane)  To help prevent nausea and vomiting after your treatment, we encourage you to take your nausea medication as prescribed.    If you develop nausea and vomiting that is not controlled by your nausea medication, call the clinic.   BELOW ARE SYMPTOMS THAT SHOULD BE REPORTED IMMEDIATELY:  *FEVER GREATER THAN 100.5 F  *CHILLS WITH OR WITHOUT FEVER  NAUSEA AND VOMITING THAT IS NOT CONTROLLED WITH YOUR NAUSEA MEDICATION  *UNUSUAL SHORTNESS OF BREATH  *UNUSUAL BRUISING OR BLEEDING  TENDERNESS IN MOUTH AND THROAT WITH OR WITHOUT PRESENCE OF ULCERS  *URINARY PROBLEMS  *BOWEL PROBLEMS  UNUSUAL RASH Items with * indicate a potential emergency and should be followed up as soon as possible.  Feel free to call the clinic should you have any questions or concerns. The clinic phone number is [phone].  Please show the [facility] at check-in to the Emergency Department and triage nurse.

## 2017-09-19 ENCOUNTER — Inpatient Hospital Stay: Payer: 59 | Attending: Oncology

## 2017-09-19 DIAGNOSIS — Z90710 Acquired absence of both cervix and uterus: Secondary | ICD-10-CM | POA: Diagnosis not present

## 2017-09-19 DIAGNOSIS — C786 Secondary malignant neoplasm of retroperitoneum and peritoneum: Secondary | ICD-10-CM | POA: Insufficient documentation

## 2017-09-19 DIAGNOSIS — Z5189 Encounter for other specified aftercare: Secondary | ICD-10-CM | POA: Diagnosis present

## 2017-09-19 DIAGNOSIS — Z170 Estrogen receptor positive status [ER+]: Secondary | ICD-10-CM | POA: Diagnosis not present

## 2017-09-19 DIAGNOSIS — Z5111 Encounter for antineoplastic chemotherapy: Secondary | ICD-10-CM | POA: Insufficient documentation

## 2017-09-19 DIAGNOSIS — C5701 Malignant neoplasm of right fallopian tube: Secondary | ICD-10-CM | POA: Diagnosis not present

## 2017-09-19 DIAGNOSIS — C762 Malignant neoplasm of abdomen: Secondary | ICD-10-CM

## 2017-09-19 DIAGNOSIS — Z90722 Acquired absence of ovaries, bilateral: Secondary | ICD-10-CM | POA: Diagnosis not present

## 2017-09-19 LAB — CBC WITH DIFFERENTIAL/PLATELET
Basophils Absolute: 0 10*3/uL (ref 0.0–0.1)
Basophils Relative: 0 %
Eosinophils Absolute: 0 10*3/uL (ref 0.0–0.5)
Eosinophils Relative: 0 %
HCT: 32.4 % — ABNORMAL LOW (ref 34.8–46.6)
Hemoglobin: 10.9 g/dL — ABNORMAL LOW (ref 11.6–15.9)
Lymphocytes Relative: 44 %
Lymphs Abs: 1.2 10*3/uL (ref 0.9–3.3)
MCH: 34.7 pg — ABNORMAL HIGH (ref 25.1–34.0)
MCHC: 33.6 g/dL (ref 31.5–36.0)
MCV: 103.4 fL — ABNORMAL HIGH (ref 79.5–101.0)
Monocytes Absolute: 0.1 10*3/uL (ref 0.1–0.9)
Monocytes Relative: 2 %
Neutro Abs: 1.5 10*3/uL (ref 1.5–6.5)
Neutrophils Relative %: 54 %
Platelets: 190 10*3/uL (ref 145–400)
RBC: 3.13 MIL/uL — ABNORMAL LOW (ref 3.70–5.45)
RDW: 14.1 % (ref 11.2–14.5)
WBC: 2.8 10*3/uL — ABNORMAL LOW (ref 3.9–10.3)

## 2017-09-19 LAB — COMPREHENSIVE METABOLIC PANEL
ALT: 24 U/L (ref 0–44)
AST: 23 U/L (ref 15–41)
Albumin: 4.4 g/dL (ref 3.5–5.0)
Alkaline Phosphatase: 58 U/L (ref 38–126)
Anion gap: 10 (ref 5–15)
BUN: 10 mg/dL (ref 6–20)
CO2: 25 mmol/L (ref 22–32)
Calcium: 9.4 mg/dL (ref 8.9–10.3)
Chloride: 101 mmol/L (ref 98–111)
Creatinine, Ser: 0.71 mg/dL (ref 0.44–1.00)
GFR calc Af Amer: >60 mL/min (ref >60)
GFR calc non Af Amer: >60 mL/min (ref >60)
Glucose, Bld: 96 mg/dL (ref 70–99)
Potassium: 4.4 mmol/L (ref 3.5–5.1)
Sodium: 136 mmol/L (ref 135–145)
Total Bilirubin: 0.6 mg/dL (ref 0.3–1.2)
Total Protein: 7.4 g/dL (ref 6.5–8.1)

## 2017-09-20 ENCOUNTER — Inpatient Hospital Stay: Payer: 59

## 2017-09-20 ENCOUNTER — Encounter: Payer: Self-pay | Admitting: Oncology

## 2017-09-20 VITALS — BP 111/67 | HR 58 | Temp 97.8°F | Resp 17 | Ht 63.0 in | Wt 122.0 lb

## 2017-09-20 DIAGNOSIS — Z5111 Encounter for antineoplastic chemotherapy: Secondary | ICD-10-CM | POA: Diagnosis not present

## 2017-09-20 DIAGNOSIS — C786 Secondary malignant neoplasm of retroperitoneum and peritoneum: Secondary | ICD-10-CM

## 2017-09-20 DIAGNOSIS — C5701 Malignant neoplasm of right fallopian tube: Secondary | ICD-10-CM

## 2017-09-20 DIAGNOSIS — Z7189 Other specified counseling: Secondary | ICD-10-CM

## 2017-09-20 DIAGNOSIS — C762 Malignant neoplasm of abdomen: Secondary | ICD-10-CM

## 2017-09-20 DIAGNOSIS — C801 Malignant (primary) neoplasm, unspecified: Principal | ICD-10-CM

## 2017-09-20 MED ORDER — ONDANSETRON HCL 8 MG PO TABS
8.0000 mg | ORAL_TABLET | Freq: Once | ORAL | Status: AC
  Administered 2017-09-20: 8 mg via ORAL

## 2017-09-20 MED ORDER — PACLITAXEL PROTEIN-BOUND CHEMO INJECTION 100 MG
100.0000 mg/m2 | Freq: Once | Status: AC
  Administered 2017-09-20: 150 mg via INTRAVENOUS
  Filled 2017-09-20: qty 30

## 2017-09-20 MED ORDER — SODIUM CHLORIDE 0.9 % IV SOLN
Freq: Once | INTRAVENOUS | Status: AC
  Administered 2017-09-20: 09:00:00 via INTRAVENOUS
  Filled 2017-09-20: qty 250

## 2017-09-20 MED ORDER — ONDANSETRON HCL 8 MG PO TABS
ORAL_TABLET | ORAL | Status: AC
  Filled 2017-09-20: qty 1

## 2017-09-20 NOTE — Patient Instructions (Signed)
[facility] Discharge Instructions for Patients Receiving Chemotherapy  Today you received the following chemotherapy agents: Paclitaxel-protein bound (Abraxane)  To help prevent nausea and vomiting after your treatment, we encourage you to take your nausea medication as prescribed.    If you develop nausea and vomiting that is not controlled by your nausea medication, call the clinic.   BELOW ARE SYMPTOMS THAT SHOULD BE REPORTED IMMEDIATELY:  *FEVER GREATER THAN 100.5 F  *CHILLS WITH OR WITHOUT FEVER  NAUSEA AND VOMITING THAT IS NOT CONTROLLED WITH YOUR NAUSEA MEDICATION  *UNUSUAL SHORTNESS OF BREATH  *UNUSUAL BRUISING OR BLEEDING  TENDERNESS IN MOUTH AND THROAT WITH OR WITHOUT PRESENCE OF ULCERS  *URINARY PROBLEMS  *BOWEL PROBLEMS  UNUSUAL RASH Items with * indicate a potential emergency and should be followed up as soon as possible.  Feel free to call the clinic should you have any questions or concerns. The clinic phone number is [phone].  Please show the [facility] at check-in to the Emergency Department and triage nurse.

## 2017-09-21 ENCOUNTER — Inpatient Hospital Stay: Payer: 59

## 2017-09-21 VITALS — BP 117/70 | HR 70 | Temp 97.8°F | Resp 18

## 2017-09-21 DIAGNOSIS — C786 Secondary malignant neoplasm of retroperitoneum and peritoneum: Secondary | ICD-10-CM

## 2017-09-21 DIAGNOSIS — Z7189 Other specified counseling: Secondary | ICD-10-CM

## 2017-09-21 DIAGNOSIS — C5701 Malignant neoplasm of right fallopian tube: Secondary | ICD-10-CM

## 2017-09-21 DIAGNOSIS — C801 Malignant (primary) neoplasm, unspecified: Principal | ICD-10-CM

## 2017-09-21 DIAGNOSIS — Z5111 Encounter for antineoplastic chemotherapy: Secondary | ICD-10-CM | POA: Diagnosis not present

## 2017-09-21 DIAGNOSIS — C762 Malignant neoplasm of abdomen: Secondary | ICD-10-CM

## 2017-09-21 MED ORDER — PEGFILGRASTIM-CBQV 6 MG/0.6ML ~~LOC~~ SOSY
PREFILLED_SYRINGE | SUBCUTANEOUS | Status: AC
Start: 2017-09-21 — End: ?
  Filled 2017-09-21: qty 0.6

## 2017-09-21 MED ORDER — PEGFILGRASTIM-CBQV 6 MG/0.6ML ~~LOC~~ SOSY
6.0000 mg | PREFILLED_SYRINGE | Freq: Once | SUBCUTANEOUS | Status: AC
  Administered 2017-09-21: 6 mg via SUBCUTANEOUS

## 2017-09-21 NOTE — Patient Instructions (Signed)
Pegfilgrastim injection What is this medicine? PEGFILGRASTIM (PEG fil gra stim) is a long-acting granulocyte colony-stimulating factor that stimulates the growth of neutrophils, a type of white blood cell important in the body's fight against infection. It is used to reduce the incidence of fever and infection in patients with certain types of cancer who are receiving chemotherapy that affects the bone marrow, and to increase survival after being exposed to high doses of radiation. This medicine may be used for other purposes; ask your health care provider or pharmacist if you have questions. COMMON BRAND NAME(S): Neulasta What should I tell my health care provider before I take this medicine? They need to know if you have any of these conditions: -kidney disease -latex allergy -ongoing radiation therapy -sickle cell disease -skin reactions to acrylic adhesives (On-Body Injector only) -an unusual or allergic reaction to pegfilgrastim, filgrastim, other medicines, foods, dyes, or preservatives -pregnant or trying to get pregnant -breast-feeding How should I use this medicine? This medicine is for injection under the skin. If you get this medicine at home, you will be taught how to prepare and give the pre-filled syringe or how to use the On-body Injector. Refer to the patient Instructions for Use for detailed instructions. Use exactly as directed. Tell your healthcare provider immediately if you suspect that the On-body Injector may not have performed as intended or if you suspect the use of the On-body Injector resulted in a missed or partial dose. It is important that you put your used needles and syringes in a special sharps container. Do not put them in a trash can. If you do not have a sharps container, call your pharmacist or healthcare provider to get one. Talk to your pediatrician regarding the use of this medicine in children. While this drug may be prescribed for selected conditions,  precautions do apply. Overdosage: If you think you have taken too much of this medicine contact a poison control center or emergency room at once. NOTE: This medicine is only for you. Do not share this medicine with others. What if I miss a dose? It is important not to miss your dose. Call your doctor or health care professional if you miss your dose. If you miss a dose due to an On-body Injector failure or leakage, a new dose should be administered as soon as possible using a single prefilled syringe for manual use. What may interact with this medicine? Interactions have not been studied. Give your health care provider a list of all the medicines, herbs, non-prescription drugs, or dietary supplements you use. Also tell them if you smoke, drink alcohol, or use illegal drugs. Some items may interact with your medicine. This list may not describe all possible interactions. Give your health care provider a list of all the medicines, herbs, non-prescription drugs, or dietary supplements you use. Also tell them if you smoke, drink alcohol, or use illegal drugs. Some items may interact with your medicine. What should I watch for while using this medicine? You may need blood work done while you are taking this medicine. If you are going to need a MRI, CT scan, or other procedure, tell your doctor that you are using this medicine (On-Body Injector only). What side effects may I notice from receiving this medicine? Side effects that you should report to your doctor or health care professional as soon as possible: -allergic reactions like skin rash, itching or hives, swelling of the face, lips, or tongue -dizziness -fever -pain, redness, or irritation at site   where injected -pinpoint red spots on the skin -red or dark-brown urine -shortness of breath or breathing problems -stomach or side pain, or pain at the shoulder -swelling -tiredness -trouble passing urine or change in the amount of urine Side  effects that usually do not require medical attention (report to your doctor or health care professional if they continue or are bothersome): -bone pain -muscle pain This list may not describe all possible side effects. Call your doctor for medical advice about side effects. You may report side effects to FDA at 1-800-FDA-1088. Where should I keep my medicine? Keep out of the reach of children. Store pre-filled syringes in a refrigerator between 2 and 8 degrees C (36 and 46 degrees F). Do not freeze. Keep in carton to protect from light. Throw away this medicine if it is left out of the refrigerator for more than 48 hours. Throw away any unused medicine after the expiration date. NOTE: This sheet is a summary. It may not cover all possible information. If you have questions about this medicine, talk to your doctor, pharmacist, or health care provider.  2018 Elsevier/Gold Standard (2016-01-29 12:58:03)  

## 2017-09-23 ENCOUNTER — Telehealth: Payer: Self-pay | Admitting: Oncology

## 2017-09-23 NOTE — Telephone Encounter (Signed)
Spoke w/ pt re appts that were added per 8/7 sch msg

## 2017-10-04 ENCOUNTER — Other Ambulatory Visit: Payer: Self-pay | Admitting: Oncology

## 2017-10-10 ENCOUNTER — Other Ambulatory Visit: Payer: 59

## 2017-10-10 ENCOUNTER — Inpatient Hospital Stay: Payer: 59

## 2017-10-10 DIAGNOSIS — Z5111 Encounter for antineoplastic chemotherapy: Secondary | ICD-10-CM | POA: Diagnosis not present

## 2017-10-10 DIAGNOSIS — C786 Secondary malignant neoplasm of retroperitoneum and peritoneum: Secondary | ICD-10-CM

## 2017-10-10 DIAGNOSIS — C5701 Malignant neoplasm of right fallopian tube: Secondary | ICD-10-CM

## 2017-10-10 DIAGNOSIS — C801 Malignant (primary) neoplasm, unspecified: Secondary | ICD-10-CM

## 2017-10-10 DIAGNOSIS — C762 Malignant neoplasm of abdomen: Secondary | ICD-10-CM

## 2017-10-10 LAB — CBC WITH DIFFERENTIAL/PLATELET
Basophils Absolute: 0 10*3/uL (ref 0.0–0.1)
Basophils Relative: 0 %
Eosinophils Absolute: 0 10*3/uL (ref 0.0–0.5)
Eosinophils Relative: 0 %
HCT: 37.8 % (ref 34.8–46.6)
Hemoglobin: 12.2 g/dL (ref 11.6–15.9)
Lymphocytes Relative: 27 %
Lymphs Abs: 1.2 10*3/uL (ref 0.9–3.3)
MCH: 33.2 pg (ref 25.1–34.0)
MCHC: 32.3 g/dL (ref 31.5–36.0)
MCV: 103 fL — ABNORMAL HIGH (ref 79.5–101.0)
Monocytes Absolute: 0.3 10*3/uL (ref 0.1–0.9)
Monocytes Relative: 7 %
Neutro Abs: 3 10*3/uL (ref 1.5–6.5)
Neutrophils Relative %: 66 %
Platelets: 253 10*3/uL (ref 145–400)
RBC: 3.67 MIL/uL — ABNORMAL LOW (ref 3.70–5.45)
RDW: 13.7 % (ref 11.2–14.5)
WBC: 4.5 10*3/uL (ref 3.9–10.3)

## 2017-10-10 LAB — COMPREHENSIVE METABOLIC PANEL
ALT: 25 U/L (ref 0–44)
AST: 23 U/L (ref 15–41)
Albumin: 4.8 g/dL (ref 3.5–5.0)
Alkaline Phosphatase: 75 U/L (ref 38–126)
Anion gap: 9 (ref 5–15)
BUN: 13 mg/dL (ref 6–20)
CO2: 27 mmol/L (ref 22–32)
Calcium: 9.9 mg/dL (ref 8.9–10.3)
Chloride: 104 mmol/L (ref 98–111)
Creatinine, Ser: 0.7 mg/dL (ref 0.44–1.00)
GFR calc Af Amer: >60 mL/min (ref >60)
GFR calc non Af Amer: >60 mL/min (ref >60)
Glucose, Bld: 97 mg/dL (ref 70–99)
Potassium: 3.7 mmol/L (ref 3.5–5.1)
Sodium: 140 mmol/L (ref 135–145)
Total Bilirubin: 0.6 mg/dL (ref 0.3–1.2)
Total Protein: 8.1 g/dL (ref 6.5–8.1)

## 2017-10-10 LAB — TOTAL PROTEIN, URINE DIPSTICK: Protein, ur: NEGATIVE mg/dL

## 2017-10-11 ENCOUNTER — Inpatient Hospital Stay (HOSPITAL_BASED_OUTPATIENT_CLINIC_OR_DEPARTMENT_OTHER): Payer: 59 | Admitting: Adult Health

## 2017-10-11 ENCOUNTER — Inpatient Hospital Stay: Payer: 59

## 2017-10-11 ENCOUNTER — Encounter: Payer: Self-pay | Admitting: Adult Health

## 2017-10-11 ENCOUNTER — Other Ambulatory Visit: Payer: Self-pay | Admitting: Oncology

## 2017-10-11 ENCOUNTER — Ambulatory Visit: Payer: 59 | Admitting: Oncology

## 2017-10-11 VITALS — BP 122/63 | HR 58 | Temp 97.5°F | Resp 18 | Ht 63.0 in | Wt 121.9 lb

## 2017-10-11 DIAGNOSIS — C5701 Malignant neoplasm of right fallopian tube: Secondary | ICD-10-CM

## 2017-10-11 DIAGNOSIS — C786 Secondary malignant neoplasm of retroperitoneum and peritoneum: Secondary | ICD-10-CM | POA: Diagnosis not present

## 2017-10-11 DIAGNOSIS — Z7189 Other specified counseling: Secondary | ICD-10-CM

## 2017-10-11 DIAGNOSIS — Z170 Estrogen receptor positive status [ER+]: Secondary | ICD-10-CM

## 2017-10-11 DIAGNOSIS — C801 Malignant (primary) neoplasm, unspecified: Principal | ICD-10-CM

## 2017-10-11 DIAGNOSIS — Z5111 Encounter for antineoplastic chemotherapy: Secondary | ICD-10-CM | POA: Diagnosis not present

## 2017-10-11 DIAGNOSIS — C762 Malignant neoplasm of abdomen: Secondary | ICD-10-CM

## 2017-10-11 LAB — CA 125: Cancer Antigen (CA) 125: 39.5 U/mL — ABNORMAL HIGH (ref 0.0–38.1)

## 2017-10-11 MED ORDER — PACLITAXEL PROTEIN-BOUND CHEMO INJECTION 100 MG
100.0000 mg/m2 | Freq: Once | INTRAVENOUS | Status: AC
  Administered 2017-10-11: 150 mg via INTRAVENOUS
  Filled 2017-10-11: qty 30

## 2017-10-11 MED ORDER — ONDANSETRON HCL 8 MG PO TABS
8.0000 mg | ORAL_TABLET | Freq: Once | ORAL | Status: AC
  Administered 2017-10-11: 8 mg via ORAL

## 2017-10-11 MED ORDER — SODIUM CHLORIDE 0.9 % IV SOLN
Freq: Once | INTRAVENOUS | Status: AC
  Administered 2017-10-11: 16:00:00 via INTRAVENOUS
  Filled 2017-10-11: qty 250

## 2017-10-11 MED ORDER — PACLITAXEL PROTEIN-BOUND CHEMO INJECTION 100 MG
100.0000 mg/m2 | Freq: Once | Status: DC
  Filled 2017-10-11: qty 30

## 2017-10-11 MED ORDER — ONDANSETRON HCL 8 MG PO TABS
ORAL_TABLET | ORAL | Status: AC
  Filled 2017-10-11: qty 1

## 2017-10-11 NOTE — Progress Notes (Addendum)
[facility]  Telephone:([phone]) [phone] Fax:(336) [phone]     ID: [name] DOB: 1965/11/23  MR#: [id number]  GXQ#:[id number]  Patient Care Team: Harlan Stains, MD as PCP - General (Family Medicine) [name], MD as Consulting Physician (Obstetrics and Gynecology) Juanita Craver, MD as Consulting Physician (Gastroenterology) Servando Salina, MD as Consulting Physician (Obstetrics and Gynecology) Nickie Retort, MD as Consulting Physician (Urology) Gillis Ends, MD as Referring Physician (Obstetrics and Gynecology) OTHER MD: Festus Aloe [phone])  CHIEF COMPLAINT:  ovarian cancer  CURRENT TREATMENT: Abraxane  INTERVAL HISTORY: Chamara returns today for a follow-up and treatment of her recurrent ovarian cancer accompanied by her sister Opal Sidles, and husband. She is currently receiving Abraxane, given days 1, 8,  of each 21-day cycle, with today being day 1 cycle 4.  She receives [place] on day 9 of each cycle.  She is tolerating treatment well.  Her CA 125 continues to fall and is 39.5 today.    REVIEW OF SYSTEMS: Harriette is doing well today. She notes some numbness and tingling in her shins, ankles, and in the back of her neck and in her back.  She denies any numbness and tingling in her fingertips and toes.   She also has some nasal congestion and bloody drainage in her nose, particularly the first four days after chemtoherapy.    Teletha denies any concerns today. A detailed ROS was otherwise non contributory.    HISTORY OF PRESENT ILLNESS: From Dr. Serita Grit original intake note 03/17/2015:  "Rozena Fierro is a very pleasant G4P4 who is seen in consultation at the request of Dr Collene Mares and Dr Garwin Brothers for peritoneal carcinomatosis. The patient has a history of a workup by urology for gross hematuria which included a pelvic examine was suggested for extrinsic mass effect on the bladder on cystoscopy. Cystoscopy took place on in December 2016. The  patient denies abdominal pain bloating early satiety or abdominal distention.  On 08/03/2015 at [facility] she underwent a CT scan of the abdomen and pelvis as ordered by Dr Baruch Gouty. This revealed a 1.4 cm low attenuation lesion on the posterior right hepatic lobe suggestive of a hemangioma. Status post hysterectomy. No ovarian masses. Moderate ascites. Omental caking seen in the lateral left abdomen and pelvis. Peritoneal nodularity in the left lateral pelvic cul-de-sac. No gross extrinsic compression on the bladder was identified on imaging.  The patient was then seen by her gastroenterologist, Dr. Collene Mares, who performed a colonoscopy which was unremarkable.  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 She was treated adjuvantly with carboplatin and paclitaxel, as detailed below. Her CA 125 dropped from 1226 on 03/20/2015 to 26.1 by 06/16/2015.  Her subsequent history is as detailed below.  I PAST MEDICAL HISTORY: Past Medical History:  Diagnosis Date  . Acute sensory neuropathy 06/26/2015  . Allergy   . Anemia   . Asthma    illness induced asthma  . Blood transfusion without reported diagnosis    at age 52 years old D/T surgery to femur being crushed  . Complication of anesthesia    hx. of allergic to ether (had surgery at 52 years and had reaction to the ether  . Heart  murmur    pt, states had a "working heart murmur"  . History of kidney stones   . PONV (postoperative nausea and vomiting)   . Skin cancer     PAST SURGICAL HISTORY: Past Surgical History:  Procedure Laterality Date  . 3 laporscopic proceedures    . ABDOMINAL  HYSTERECTOMY     2010  . CHOLECYSTECTOMY     2010  . DILATION AND CURETTAGE OF UTERUS     1997  . LAPAROSCOPY N/A 03/27/2015   Procedure: DIAGNOSTIC LAPAROSCOPY ;  Surgeon: [name], MD;  Location: [facility];  Service: Gynecology;  Laterality: N/A;  . LAPAROSCOPY N/A 02/24/2016   Procedure: LAPAROSCOPY DIAGNOSTIC WITH PERITONEAL WASHINGS AND PERITONEAL BIOPSY;  Surgeon: [name], MD;  Location: [facility];  Service: Gynecology;  Laterality: N/A;  . LAPAROTOMY N/A 03/27/2015   Procedure: EXPLORATORY LAPAROTOMY, BILATERAL SALPINGO OOPHORECTOMY, OMENTECTOMY, RADICAL TUMOR DEBULKING;  Surgeon: [name], MD;  Location: [facility];  Service: Gynecology;  Laterality: N/A;  . sinus surgery 1994      FAMILY HISTORY Family History  Problem Relation Age of Onset  . Hypertension Father   . Skin cancer Father        nonmelanoma skin cancers in his late 27s  . Non-Hodgkin's lymphoma Maternal Aunt        dx. 70s; smoker  . Stroke Maternal Grandmother   . Other Mother        benign meningioma dx. early-mid-70s; hysterectomy in her late 67s for heavy periods - still has ovaries  . Other Son        one son with pre-cancerous skin findings  . Other Daughter        cysts on ovaries and hx of heavy periods  . Other Sister        hysterectomy for cysts - still has ovaries  . Heart attack Maternal Grandfather   . Heart attack Paternal Grandfather   . Renal cancer Maternal Aunt 60       smoker  . Breast cancer Maternal Aunt 78  . Other Maternal Aunt        dx. benign brain tumor (meningioma) at 42; 2nd benign brain tumor in her late 74s; hx of radical hysterectomy at age 81  . Brain cancer Other        NOS tumor  The patient's parents are both living, in their late 57s as of January 2018. The patient has one brother, 3 sisters. There is no history of ovarian cancer in the family. One maternal aunt had breast and kidney cancer, another had non-Hodgkin's lymphoma.  GYNECOLOGIC HISTORY:  No LMP recorded.  Patient has had a hysterectomy. 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 Her husband [name] is a Engineer, maintenance (IT). Their children are 27, 10, 55 and 36 y/o as of JAN 2018. The oldest works as an Electrical engineer in the [employer] in [place]. The other 3 children live in [place], the youngest currently attending [employer]. The patient has no grandchildren. She attends [employer]    ADVANCED DIRECTIVES:    HEALTH MAINTENANCE: Social History   Tobacco Use  . Smoking status: Never Smoker  . Smokeless tobacco: Never Used  Substance Use Topics  . Alcohol use: Yes    Comment:  3 glasses wine or beer/week  . Drug use: No     Colonoscopy:2016  PAP: s/p hyst  Bone  density:   Allergies  Allergen Reactions  . Bee Venom Shortness Of Breath    swelling  . Compazine [Prochlorperazine Edisylate] Anaphylaxis, Anxiety and Palpitations  . Sulfa Antibiotics Shortness Of Breath    Vomit , diarrhea , hives  . Iodinated Diagnostic Agents Other (See Comments)    01/27/17 Pt reports sneezing and congestion. Pt states she takes claritin as a premed for CT scan in [place]. Premedicated with Benadryl 25 mg PO. Post CT,no sneezing but had mild drainage/congestion which resolved after a few minutes with oral fluids.    Current Outpatient Medications  Medication Sig Dispense Refill  . fluticasone (FLONASE) 50 MCG/ACT nasal spray Place 1 spray into both nostrils 2 (two) times daily. 16 g 2  . loratadine (CLARITIN) 10 MG tablet Take 10 mg by mouth daily.     No current facility-administered medications for this visit.     OBJECTIVE:  Vitals:   10/11/17 1441  BP: 122/63  Pulse: (!) 58  Resp: 18  Temp: (!) 97.5 F (36.4 C)  SpO2: 100%     Body mass index is 21.59 kg/m.   ECOG FS:1 - Symptomatic but completely ambulatory GENERAL: Patient is a well appearing female in no acute distress HEENT:   Sclerae anicteric.  Oropharynx clear and moist. No ulcerations or evidence of oropharyngeal candidiasis. Neck is supple.  NODES:  No cervical, supraclavicular, or axillary lymphadenopathy palpated.  BREAST EXAM:  Deferred. LUNGS:  Clear to auscultation bilaterally.  No wheezes or rhonchi. HEART:  Regular rate and rhythm. No murmur appreciated. ABDOMEN:  Soft, mildly tender.  Positive, normoactive bowel sounds. No organomegaly palpated. MSK:  No focal spinal tenderness to palpation. Full range of motion bilaterally in the upper extremities. EXTREMITIES:  No peripheral edema.   SKIN:  Clear with no obvious rashes or skin changes. No nail dyscrasia. NEURO:  Nonfocal. Well oriented.  Appropriate affect.     LAB RESULTS:  CMP     Component Value Date/Time   NA 140 10/10/2017 1112   NA 140 02/11/2017 0755   K 3.7 10/10/2017 1112   K 4.4 02/11/2017 0755   CL 104 10/10/2017 1112   CO2 27 10/10/2017 1112   CO2 26 02/11/2017 0755   GLUCOSE 97 10/10/2017 1112   GLUCOSE 93 02/11/2017 0755   BUN 13 10/10/2017 1112   BUN 7.9 02/11/2017 0755   CREATININE 0.70 10/10/2017 1112   CREATININE 0.72 03/25/2017 0832   CREATININE 0.7 02/11/2017 0755   CALCIUM 9.9 10/10/2017 1112   CALCIUM 9.6 02/11/2017 0755   PROT 8.1 10/10/2017 1112   PROT 7.5 02/11/2017 0755   ALBUMIN 4.8 10/10/2017 1112   ALBUMIN 4.4 02/11/2017 0755   AST 23 10/10/2017 1112   AST 74 (H) 03/25/2017 0832   AST 27 02/11/2017 0755   ALT 25 10/10/2017 1112   ALT 155 (H) 03/25/2017 0832   ALT 24 02/11/2017 0755   ALKPHOS 75 10/10/2017 1112   ALKPHOS 58 02/11/2017 0755   BILITOT 0.6 10/10/2017 1112   BILITOT 0.4 03/25/2017 0832   BILITOT 0.43 02/11/2017 0755   GFRNONAA >60 10/10/2017 1112   GFRNONAA >60 03/25/2017 0832   GFRAA >60 10/10/2017 1112   GFRAA >60 03/25/2017 0832    INo results found for: SPEP, UPEP  Lab Results  Component Value Date   WBC 4.5 10/10/2017   NEUTROABS 3.0 10/10/2017   HGB 12.2 10/10/2017     HCT 37.8 10/10/2017   MCV 103.0 (H) 10/10/2017   PLT 253 10/10/2017  Chemistry      Component Value Date/Time   NA 140 10/10/2017 1112   NA 140 02/11/2017 0755   K 3.7 10/10/2017 1112   K 4.4 02/11/2017 0755   CL 104 10/10/2017 1112   CO2 27 10/10/2017 1112   CO2 26 02/11/2017 0755   BUN 13 10/10/2017 1112   BUN 7.9 02/11/2017 0755   CREATININE 0.70 10/10/2017 1112   CREATININE 0.72 03/25/2017 0832   CREATININE 0.7 02/11/2017 0755      Component Value Date/Time   CALCIUM 9.9 10/10/2017 1112   CALCIUM 9.6 02/11/2017 0755   ALKPHOS 75 10/10/2017 1112   ALKPHOS 58 02/11/2017 0755   AST 23 10/10/2017 1112   AST 74 (H) 03/25/2017 0832   AST 27 02/11/2017 0755   ALT 25 10/10/2017 1112   ALT 155 (H) 03/25/2017 0832   ALT 24 02/11/2017 0755   BILITOT 0.6 10/10/2017 1112   BILITOT 0.4 03/25/2017 0832   BILITOT 0.43 02/11/2017 0755       No results found for: LABCA2  No components found for: LABCA125  No results for input(s): INR in the last 168 hours.  Urinalysis    Component Value Date/Time   COLORURINE STRAW (A) 04/08/2017 1123   APPEARANCEUR CLEAR 04/08/2017 1123   LABSPEC 1.003 (L) 04/08/2017 1123   LABSPEC 1.005 10/09/2015 1239   PHURINE 8.0 04/08/2017 1123   GLUCOSEU NEGATIVE 04/08/2017 1123   GLUCOSEU Negative 10/09/2015 1239   HGBUR NEGATIVE 04/08/2017 1123   BILIRUBINUR NEGATIVE 04/08/2017 1123   BILIRUBINUR Negative 10/09/2015 1239   KETONESUR NEGATIVE 04/08/2017 1123   PROTEINUR NEGATIVE 10/10/2017 1113   UROBILINOGEN 0.2 10/09/2015 1239   NITRITE NEGATIVE 04/08/2017 1123   LEUKOCYTESUR NEGATIVE 04/08/2017 1123   LEUKOCYTESUR Negative 10/09/2015 1239    STUDIES:  No results found.   ELIGIBLE FOR AVAILABLE RESEARCH PROTOCOL:  discussing protocols at [facility] at [place]: 52 y.o. BRCA negative [place] woman status post radical tumor debulking with optimal cytoreduction (R0) 03/27/2015 for a stage IIIC, high-grade right  fallopian tube carcinoma  (a) baseline CA-125 was1226.  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)  (c) tumor is PD-L1 negative (02/24/2016)  (d) tumor is strongly estrogen receptor positive, progesterone receptor negative (02/24/2016)  (1) adjuvant chemotherapy consisted of carboplatin and paclitaxel for 6 doses, begun 04/14/2015, completed 08/11/2015  (a) paclitaxel was omitted from cycle 5 and dose reduced on cycle 6 because of neuropathy   (b) a "make up" dose of paclitaxel was given 08/25/2015  (c) last carboplatin dose was 08/11/2015  (d) CA-125 normalized by 06/16/2015   (2) FIRST RECURRENCE: January 2018  (a) CA-125 rise beginning November 2017 led to CT scans and PET scans December 2017, all negative  (b) exploratory laparotomy 02/24/2016 showed pathologically confirmed recurrence, with miliary disease involving all examined surfaces  (c) port-site metastases noted 03/08/2016  (d) the January 2018 tumor sample was tested for the estrogen receptor and he was 80 percent positive with strong staining, progesterone receptor negative  (3) carboplatin/liposomal doxorubicin started 03/05/2016, repeated every three weeks x4, completed 06/04/2016.  (a) cycle 3 delayed one week because of neutropenia; OnPro added  (b) CA 125 normalized after cycle 3  (4) RUCAPARIB maintenance started 07/02/2016  (a) restaging 10/01/2016: Normal CA 125, negative CT of the abdomen and pelvis  (b) labs 11/24/2016 shows a rise in her  CA 125-43.4, with continuing rise thereafter  (c) rucaparib discontinued October 2018 with rising CA 125  (5) anastrozole started December 23, 2016-stopped after a couple of weeks  SECOND RECURRENCE: (6) Gemcitabine/Bevacizumab started on 02/04/2017  (a) gemcitabine omitted cycle 4 because of intercurrent  infection  (b) gemcitabine resumed with cycle 5, with intervening rise in  Ca 125  (c) repeat CT scan of the abdomen and pelvis on 05/26/2017 does not confirm obvious disease progression  (d) cycle 5 of gemcitabine/bevacizumab delayed 1 week   (e) gemcitabine/ bevacizumab discontinued after 6 cycles, with rising CA 125 (last dose 06/24/2017)  (7) foundation one testing did not show a high mutation burden and the microsatellite status was stable.  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  These were not immediately actionable  (8) Abraxane started 07/12/2017, repeated weekly x3 with neulasta day 16 first cycle  (a) cycle 2 started 08/09/2017, will be day 1 day 8 only  (b) cycle 3 scheduled to start 09/13/2017, day 1 day 8 at patient request  PLAN Kimorah is doing well today.  I reviewed her labs with her today.  Her CA125 has dropped to again to 39.5  today.  This is great news.  She will proceed with Abraxane today, cycle 4 day 1, she is tolerating it well.  She will receive this on day 1 and day 8 with Udenyca on day 9.  She has recently been doing this on a 28 day cycle instead of a 21 day cycle and is tolerating having a two week off break better.    Lelan Pons met with Dr. Jana Hakim who reviewed her plan with her.  She will receive cycle 4 and then cycle 5 of her chemotherapy.  He recommended she reach out to her oncologist that she had seen at [facility] for suggestions regarding transitioning to a PARP or other oral therapy once she completes her treatment.  We are pleased that her tumor markers have continued to decrease.    Domonique will return in 1 week for cycle 4 day 8, and then in 4 weeks for labs, f/u, and cycle 5 of her treatment.  She knows to call for any other issues that may develop before the next visit.  A total of (30) minutes of face-to-face time was spent with this patient with greater than 50% of that time in counseling and care-coordination.   Wilber Bihari, NP  10/11/17  2:51 PM Medical Oncology and Hematology [facility] [address] [place], [place] [address] Tel. [phone]    Fax. [phone]   ADDENDUM: Elida is having a very good response to the paclitaxel/Abraxane, and her Ca1 25 has just about normalized.  She is very eager to go off treatment and I certainly can understand that.  I would note however that despite being on treatment she is incredibly active, has done a wonderful job with the organization on ovarian cancer that she sponsors, raised quite a bit of money, and the majority of my patients would love to have the energy that she has even while she is getting chemo.  I drew her a graph showing that the limit of detection is not cure and that the deeper we get into remission then the more time hopefully she will have off treatment.  She agreed to an additional 4 doses of Taxol.  I think it would be very reasonable to go off after that.  The question will be to want to put in  its place.  We did not have good luck with through Her rib and she does not seem to be a candidate for pembrolizumab or congeners.  I will check with Dr. Theora Gianotti at [facility], Dr. Denman George here, and Dr. Robina Ade at [facility] to see what they suggest.  Antiestrogens may be a relatively well-tolerated possibility.  She will see me again in mid October and will be restaged before that visit.  At this point I am delighted that she is doing so well.  I personally saw this patient and performed a substantive portion of this encounter with the listed APP documented above.   Chauncey Cruel, MD Medical Oncology and Hematology [facility] [address] [place], [place] [address] Tel. [phone]    Fax. [phone]

## 2017-10-12 ENCOUNTER — Encounter: Payer: Self-pay | Admitting: Gynecologic Oncology

## 2017-10-12 ENCOUNTER — Inpatient Hospital Stay (HOSPITAL_BASED_OUTPATIENT_CLINIC_OR_DEPARTMENT_OTHER): Payer: 59 | Admitting: Gynecologic Oncology

## 2017-10-12 ENCOUNTER — Telehealth: Payer: Self-pay | Admitting: Adult Health

## 2017-10-12 ENCOUNTER — Encounter: Payer: Self-pay | Admitting: Adult Health

## 2017-10-12 VITALS — BP 109/74 | HR 64 | Temp 97.7°F | Resp 18 | Ht 63.0 in | Wt 121.0 lb

## 2017-10-12 DIAGNOSIS — Z5111 Encounter for antineoplastic chemotherapy: Secondary | ICD-10-CM | POA: Diagnosis not present

## 2017-10-12 DIAGNOSIS — Z90722 Acquired absence of ovaries, bilateral: Secondary | ICD-10-CM | POA: Diagnosis not present

## 2017-10-12 DIAGNOSIS — C5701 Malignant neoplasm of right fallopian tube: Secondary | ICD-10-CM | POA: Diagnosis not present

## 2017-10-12 DIAGNOSIS — C786 Secondary malignant neoplasm of retroperitoneum and peritoneum: Secondary | ICD-10-CM | POA: Diagnosis not present

## 2017-10-12 DIAGNOSIS — Z90710 Acquired absence of both cervix and uterus: Secondary | ICD-10-CM

## 2017-10-12 NOTE — Patient Instructions (Signed)
Please follow-up with Dr Jana Hakim as scheduled.  Elmo Putt is our Art therapist. Phone [phone]
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
## 2017-10-12 NOTE — Telephone Encounter (Signed)
Patient stopped by on 8/28 to make appts from 8/27 los as she did not have time yesterday.  Patient did not need calendar or avs.

## 2017-10-18 ENCOUNTER — Telehealth: Payer: Self-pay | Admitting: *Deleted

## 2017-10-18 ENCOUNTER — Inpatient Hospital Stay: Payer: 59 | Attending: Oncology

## 2017-10-18 ENCOUNTER — Other Ambulatory Visit: Payer: 59

## 2017-10-18 DIAGNOSIS — Z5189 Encounter for other specified aftercare: Secondary | ICD-10-CM | POA: Insufficient documentation

## 2017-10-18 DIAGNOSIS — Z9221 Personal history of antineoplastic chemotherapy: Secondary | ICD-10-CM | POA: Insufficient documentation

## 2017-10-18 DIAGNOSIS — C762 Malignant neoplasm of abdomen: Secondary | ICD-10-CM

## 2017-10-18 DIAGNOSIS — C561 Malignant neoplasm of right ovary: Secondary | ICD-10-CM | POA: Diagnosis present

## 2017-10-18 DIAGNOSIS — D701 Agranulocytosis secondary to cancer chemotherapy: Secondary | ICD-10-CM | POA: Insufficient documentation

## 2017-10-18 DIAGNOSIS — Z5111 Encounter for antineoplastic chemotherapy: Secondary | ICD-10-CM | POA: Insufficient documentation

## 2017-10-18 DIAGNOSIS — Z170 Estrogen receptor positive status [ER+]: Secondary | ICD-10-CM | POA: Diagnosis not present

## 2017-10-18 DIAGNOSIS — C5701 Malignant neoplasm of right fallopian tube: Secondary | ICD-10-CM

## 2017-10-18 LAB — COMPREHENSIVE METABOLIC PANEL
ALT: 24 U/L (ref 0–44)
AST: 24 U/L (ref 15–41)
Albumin: 4.5 g/dL (ref 3.5–5.0)
Alkaline Phosphatase: 60 U/L (ref 38–126)
Anion gap: 9 (ref 5–15)
BUN: 9 mg/dL (ref 6–20)
CO2: 26 mmol/L (ref 22–32)
Calcium: 9.9 mg/dL (ref 8.9–10.3)
Chloride: 103 mmol/L (ref 98–111)
Creatinine, Ser: 0.72 mg/dL (ref 0.44–1.00)
GFR calc Af Amer: >60 mL/min (ref >60)
GFR calc non Af Amer: >60 mL/min (ref >60)
Glucose, Bld: 96 mg/dL (ref 70–99)
Potassium: 4.4 mmol/L (ref 3.5–5.1)
Sodium: 138 mmol/L (ref 135–145)
Total Bilirubin: 0.4 mg/dL (ref 0.3–1.2)
Total Protein: 7.8 g/dL (ref 6.5–8.1)

## 2017-10-18 LAB — CBC WITH DIFFERENTIAL/PLATELET
Basophils Absolute: 0 10*3/uL (ref 0.0–0.1)
Basophils Relative: 1 %
Eosinophils Absolute: 0 10*3/uL (ref 0.0–0.5)
Eosinophils Relative: 1 %
HCT: 36.4 % (ref 34.8–46.6)
Hemoglobin: 12.2 g/dL (ref 11.6–15.9)
Lymphocytes Relative: 65 %
Lymphs Abs: 1.4 10*3/uL (ref 0.9–3.3)
MCH: 33.9 pg (ref 25.1–34.0)
MCHC: 33.5 g/dL (ref 31.5–36.0)
MCV: 101.3 fL — ABNORMAL HIGH (ref 79.5–101.0)
Monocytes Absolute: 0.1 10*3/uL (ref 0.1–0.9)
Monocytes Relative: 5 %
Neutro Abs: 0.6 10*3/uL — ABNORMAL LOW (ref 1.5–6.5)
Neutrophils Relative %: 28 %
Platelets: 187 10*3/uL (ref 145–400)
RBC: 3.59 MIL/uL — ABNORMAL LOW (ref 3.70–5.45)
RDW: 14 % (ref 11.2–14.5)
WBC: 2.1 10*3/uL — ABNORMAL LOW (ref 3.9–10.3)

## 2017-10-18 NOTE — Telephone Encounter (Signed)
This RN was notified of lab value obtained today - for treatment for d8 cycle 4 of abraxane with neulasta support d9.  Noted [place] today of 0.6 and per MD- d8 treatment needs to be held - if pt wants treatment this week we can recheck lab on Thursday or Friday and treat if ANC is 1.0 or greater can proceed with treatment.  This RN called pt and obtained personal identified VM- detailed message left per above, including for pt to NOT come in at 830 for treatment. Informed her this RN will contact her in AM.

## 2017-10-19 ENCOUNTER — Ambulatory Visit: Payer: 59

## 2017-10-19 ENCOUNTER — Other Ambulatory Visit: Payer: Self-pay | Admitting: *Deleted

## 2017-10-19 DIAGNOSIS — C5701 Malignant neoplasm of right fallopian tube: Secondary | ICD-10-CM

## 2017-10-20 ENCOUNTER — Ambulatory Visit: Payer: 59

## 2017-10-20 ENCOUNTER — Inpatient Hospital Stay: Payer: 59

## 2017-10-20 VITALS — BP 118/71 | HR 56 | Temp 98.0°F | Resp 18

## 2017-10-20 DIAGNOSIS — Z7189 Other specified counseling: Secondary | ICD-10-CM

## 2017-10-20 DIAGNOSIS — C5701 Malignant neoplasm of right fallopian tube: Secondary | ICD-10-CM

## 2017-10-20 DIAGNOSIS — Z5111 Encounter for antineoplastic chemotherapy: Secondary | ICD-10-CM | POA: Diagnosis not present

## 2017-10-20 DIAGNOSIS — C762 Malignant neoplasm of abdomen: Secondary | ICD-10-CM

## 2017-10-20 DIAGNOSIS — C801 Malignant (primary) neoplasm, unspecified: Principal | ICD-10-CM

## 2017-10-20 DIAGNOSIS — C786 Secondary malignant neoplasm of retroperitoneum and peritoneum: Secondary | ICD-10-CM

## 2017-10-20 LAB — CBC WITH DIFFERENTIAL (CANCER CENTER ONLY)
Basophils Absolute: 0 10*3/uL (ref 0.0–0.1)
Basophils Relative: 0 %
Eosinophils Absolute: 0 10*3/uL (ref 0.0–0.5)
Eosinophils Relative: 1 %
HCT: 37.3 % (ref 34.8–46.6)
Hemoglobin: 12.3 g/dL (ref 11.6–15.9)
Lymphocytes Relative: 57 %
Lymphs Abs: 1.6 10*3/uL (ref 0.9–3.3)
MCH: 33.3 pg (ref 25.1–34.0)
MCHC: 33 g/dL (ref 31.5–36.0)
MCV: 101.1 fL — ABNORMAL HIGH (ref 79.5–101.0)
Monocytes Absolute: 0.2 10*3/uL (ref 0.1–0.9)
Monocytes Relative: 6 %
Neutro Abs: 1 10*3/uL — ABNORMAL LOW (ref 1.5–6.5)
Neutrophils Relative %: 36 %
Platelet Count: 211 10*3/uL (ref 145–400)
RBC: 3.69 MIL/uL — ABNORMAL LOW (ref 3.70–5.45)
RDW: 13.5 % (ref 11.2–14.5)
WBC Count: 2.7 10*3/uL — ABNORMAL LOW (ref 3.9–10.3)

## 2017-10-20 MED ORDER — SODIUM CHLORIDE 0.9 % IV SOLN
Freq: Once | INTRAVENOUS | Status: AC
  Administered 2017-10-20: 10:00:00 via INTRAVENOUS
  Filled 2017-10-20: qty 250

## 2017-10-20 MED ORDER — PACLITAXEL PROTEIN-BOUND CHEMO INJECTION 100 MG
100.0000 mg/m2 | Freq: Once | Status: DC
  Filled 2017-10-20: qty 30

## 2017-10-20 MED ORDER — PACLITAXEL PROTEIN-BOUND CHEMO INJECTION 100 MG
100.0000 mg/m2 | Freq: Once | INTRAVENOUS | Status: AC
  Administered 2017-10-20: 150 mg via INTRAVENOUS
  Filled 2017-10-20: qty 30

## 2017-10-20 MED ORDER — HEPARIN SOD (PORK) LOCK FLUSH 100 UNIT/ML IV SOLN
500.0000 [IU] | Freq: Once | INTRAVENOUS | Status: DC | PRN
  Filled 2017-10-20: qty 5

## 2017-10-20 MED ORDER — ONDANSETRON HCL 8 MG PO TABS
8.0000 mg | ORAL_TABLET | Freq: Once | ORAL | Status: AC
  Administered 2017-10-20: 8 mg via ORAL

## 2017-10-20 MED ORDER — ONDANSETRON HCL 8 MG PO TABS
ORAL_TABLET | ORAL | Status: AC
  Filled 2017-10-20: qty 1

## 2017-10-20 MED ORDER — SODIUM CHLORIDE 0.9% FLUSH
10.0000 mL | INTRAVENOUS | Status: DC | PRN
  Filled 2017-10-20: qty 10

## 2017-10-20 NOTE — Progress Notes (Signed)
Ok to tx today with ANC 1.0

## 2017-10-21 ENCOUNTER — Inpatient Hospital Stay: Payer: 59

## 2017-10-21 DIAGNOSIS — C786 Secondary malignant neoplasm of retroperitoneum and peritoneum: Secondary | ICD-10-CM

## 2017-10-21 DIAGNOSIS — C801 Malignant (primary) neoplasm, unspecified: Principal | ICD-10-CM

## 2017-10-21 DIAGNOSIS — C762 Malignant neoplasm of abdomen: Secondary | ICD-10-CM

## 2017-10-21 DIAGNOSIS — C5701 Malignant neoplasm of right fallopian tube: Secondary | ICD-10-CM

## 2017-10-21 DIAGNOSIS — Z7189 Other specified counseling: Secondary | ICD-10-CM

## 2017-10-21 DIAGNOSIS — Z5111 Encounter for antineoplastic chemotherapy: Secondary | ICD-10-CM | POA: Diagnosis not present

## 2017-10-21 MED ORDER — PEGFILGRASTIM-CBQV 6 MG/0.6ML ~~LOC~~ SOSY
PREFILLED_SYRINGE | SUBCUTANEOUS | Status: AC
  Filled 2017-10-21: qty 0.6

## 2017-10-21 MED ORDER — PEGFILGRASTIM-CBQV 6 MG/0.6ML ~~LOC~~ SOSY
6.0000 mg | PREFILLED_SYRINGE | Freq: Once | SUBCUTANEOUS | Status: AC
  Administered 2017-10-21: 6 mg via SUBCUTANEOUS

## 2017-11-07 ENCOUNTER — Telehealth: Payer: Self-pay | Admitting: Adult Health

## 2017-11-07 ENCOUNTER — Inpatient Hospital Stay: Payer: 59

## 2017-11-07 ENCOUNTER — Encounter: Payer: Self-pay | Admitting: Adult Health

## 2017-11-07 ENCOUNTER — Inpatient Hospital Stay: Payer: 59 | Admitting: Adult Health

## 2017-11-07 ENCOUNTER — Other Ambulatory Visit: Payer: 59

## 2017-11-07 VITALS — BP 103/70 | HR 58 | Temp 98.5°F | Resp 18 | Ht 63.0 in | Wt 118.4 lb

## 2017-11-07 DIAGNOSIS — C5701 Malignant neoplasm of right fallopian tube: Secondary | ICD-10-CM

## 2017-11-07 DIAGNOSIS — Z170 Estrogen receptor positive status [ER+]: Secondary | ICD-10-CM | POA: Diagnosis not present

## 2017-11-07 DIAGNOSIS — C762 Malignant neoplasm of abdomen: Secondary | ICD-10-CM

## 2017-11-07 DIAGNOSIS — T451X5A Adverse effect of antineoplastic and immunosuppressive drugs, initial encounter: Principal | ICD-10-CM

## 2017-11-07 DIAGNOSIS — D701 Agranulocytosis secondary to cancer chemotherapy: Secondary | ICD-10-CM | POA: Diagnosis not present

## 2017-11-07 DIAGNOSIS — Z9221 Personal history of antineoplastic chemotherapy: Secondary | ICD-10-CM

## 2017-11-07 DIAGNOSIS — C786 Secondary malignant neoplasm of retroperitoneum and peritoneum: Secondary | ICD-10-CM

## 2017-11-07 DIAGNOSIS — Z5111 Encounter for antineoplastic chemotherapy: Secondary | ICD-10-CM | POA: Diagnosis not present

## 2017-11-07 DIAGNOSIS — C801 Malignant (primary) neoplasm, unspecified: Secondary | ICD-10-CM

## 2017-11-07 LAB — COMPREHENSIVE METABOLIC PANEL
ALT: 17 U/L (ref 0–44)
AST: 21 U/L (ref 15–41)
Albumin: 4.4 g/dL (ref 3.5–5.0)
Alkaline Phosphatase: 75 U/L (ref 38–126)
Anion gap: 9 (ref 5–15)
BUN: 7 mg/dL (ref 6–20)
CO2: 27 mmol/L (ref 22–32)
Calcium: 10 mg/dL (ref 8.9–10.3)
Chloride: 106 mmol/L (ref 98–111)
Creatinine, Ser: 0.71 mg/dL (ref 0.44–1.00)
GFR calc Af Amer: >60 mL/min (ref >60)
GFR calc non Af Amer: >60 mL/min (ref >60)
Glucose, Bld: 90 mg/dL (ref 70–99)
Potassium: 4.5 mmol/L (ref 3.5–5.1)
Sodium: 142 mmol/L (ref 135–145)
Total Bilirubin: 0.4 mg/dL (ref 0.3–1.2)
Total Protein: 7.6 g/dL (ref 6.5–8.1)

## 2017-11-07 LAB — CBC WITH DIFFERENTIAL/PLATELET
Basophils Absolute: 0 10*3/uL (ref 0.0–0.1)
Basophils Relative: 0 %
Eosinophils Absolute: 0 10*3/uL (ref 0.0–0.5)
Eosinophils Relative: 0 %
HCT: 38.4 % (ref 34.8–46.6)
Hemoglobin: 12.4 g/dL (ref 11.6–15.9)
Lymphocytes Relative: 57 %
Lymphs Abs: 1.7 10*3/uL (ref 0.9–3.3)
MCH: 33 pg (ref 25.1–34.0)
MCHC: 32.3 g/dL (ref 31.5–36.0)
MCV: 102.1 fL — ABNORMAL HIGH (ref 79.5–101.0)
Monocytes Absolute: 0.3 10*3/uL (ref 0.1–0.9)
Monocytes Relative: 11 %
Neutro Abs: 0.9 10*3/uL — ABNORMAL LOW (ref 1.5–6.5)
Neutrophils Relative %: 32 %
Platelets: 257 10*3/uL (ref 145–400)
RBC: 3.76 MIL/uL (ref 3.70–5.45)
RDW: 14.5 % (ref 11.2–14.5)
WBC: 2.9 10*3/uL — ABNORMAL LOW (ref 3.9–10.3)

## 2017-11-07 MED ORDER — TBO-FILGRASTIM 480 MCG/0.8ML ~~LOC~~ SOSY
PREFILLED_SYRINGE | SUBCUTANEOUS | Status: AC
  Filled 2017-11-07: qty 0.8

## 2017-11-07 MED ORDER — TBO-FILGRASTIM 480 MCG/0.8ML ~~LOC~~ SOSY
480.0000 ug | PREFILLED_SYRINGE | Freq: Once | SUBCUTANEOUS | Status: AC
  Administered 2017-11-07: 480 ug via SUBCUTANEOUS

## 2017-11-07 NOTE — Patient Instructions (Signed)
Tbo-Filgrastim injection What is this medicine? TBO-FILGRASTIM (T B O fil GRA stim) is a granulocyte colony-stimulating factor that stimulates the growth of neutrophils, a type of white blood cell important in the body's fight against infection. It is used to reduce the incidence of fever and infection in patients with certain types of cancer who are receiving chemotherapy that affects the bone marrow. This medicine may be used for other purposes; ask your health care provider or pharmacist if you have questions. COMMON BRAND NAME(S): Granix What should I tell my health care provider before I take this medicine? They need to know if you have any of these conditions: -bone scan or tests planned -kidney disease -sickle cell anemia -an unusual or allergic reaction to tbo-filgrastim, filgrastim, pegfilgrastim, other medicines, foods, dyes, or preservatives -pregnant or trying to get pregnant -breast-feeding How should I use this medicine? This medicine is for injection under the skin. If you get this medicine at home, you will be taught how to prepare and give this medicine. Refer to the Instructions for Use that come with your medication packaging. Use exactly as directed. Take your medicine at regular intervals. Do not take your medicine more often than directed. It is important that you put your used needles and syringes in a special sharps container. Do not put them in a trash can. If you do not have a sharps container, call your pharmacist or healthcare provider to get one. Talk to your pediatrician regarding the use of this medicine in children. Special care may be needed. Overdosage: If you think you have taken too much of this medicine contact a poison control center or emergency room at once. NOTE: This medicine is only for you. Do not share this medicine with others. What if I miss a dose? It is important not to miss your dose. Call your doctor or health care professional if you miss a  dose. What may interact with this medicine? This medicine may interact with the following medications: -medicines that may cause a release of neutrophils, such as lithium This list may not describe all possible interactions. Give your health care provider a list of all the medicines, herbs, non-prescription drugs, or dietary supplements you use. Also tell them if you smoke, drink alcohol, or use illegal drugs. Some items may interact with your medicine. What should I watch for while using this medicine? You may need blood work done while you are taking this medicine. What side effects may I notice from receiving this medicine? Side effects that you should report to your doctor or health care professional as soon as possible: -allergic reactions like skin rash, itching or hives, swelling of the face, lips, or tongue -blood in the urine -dark urine -dizziness -fast heartbeat -feeling faint -shortness of breath or breathing problems -signs and symptoms of infection like fever or chills; cough; or sore throat -signs and symptoms of kidney injury like trouble passing urine or change in the amount of urine -stomach or side pain, or pain at the shoulder -sweating -swelling of the legs, ankles, or abdomen -tiredness Side effects that usually do not require medical attention (report to your doctor or health care professional if they continue or are bothersome): -bone pain -headache -muscle pain -vomiting This list may not describe all possible side effects. Call your doctor for medical advice about side effects. You may report side effects to FDA at 1-800-FDA-1088. Where should I keep my medicine? Keep out of the reach of children. Store in a refrigerator between   2 and 8 degrees C (36 and 46 degrees F). Keep in carton to protect from light. Throw away this medicine if it is left out of the refrigerator for more than 5 consecutive days. Throw away any unused medicine after the expiration  date. NOTE: This sheet is a summary. It may not cover all possible information. If you have questions about this medicine, talk to your doctor, pharmacist, or health care provider.  2018 Elsevier/Gold Standard (2015-03-24 19:07:04)  

## 2017-11-07 NOTE — Telephone Encounter (Signed)
Called patient to let her know a lab was added for 7:45 am tomorrow morning before her infusion.  Per LC, okay for lab at that time before infusion.  Also let patient know that 2 injections have been added per 9/23 los.

## 2017-11-07 NOTE — Progress Notes (Signed)
Kathryn Ramsey Ramsey  Telephone:(336) [phone] [phone]     ID: Kathryn Ramsey Ramsey DOB: 1966-02-15  MR#: [id number]  XJO#:[id number]  Patient Care Team: Harlan Stains, MD as PCP - General (Family Medicine) [name], MD as Consulting Physician (Obstetrics and Gynecology) Juanita Craver, MD as Consulting Physician (Gastroenterology) Servando Salina, MD as Consulting Physician (Obstetrics and Gynecology) Nickie Retort, MD as Consulting Physician (Urology) Gillis Ends, MD as Referring Physician (Obstetrics and Gynecology) OTHER MD: Festus Aloe [phone])  CHIEF COMPLAINT:  ovarian cancer  CURRENT TREATMENT: Abraxane  INTERVAL HISTORY: Kathryn Ramsey Ramsey returns today for a follow-up and treatment of her recurrent ovarian cancer accompanied by her sister Kathryn Ramsey Ramsey, and Kathryn Ramsey. She is currently receiving Abraxane, given days 1, 8,  of each 21-day cycle, with tomorrow being day 1 cycle 5.  She receives [place] on day 9 of each cycle.  She is tolerating treatment well.  Her CA 125 has continued to decrease, and this level is pending today.  REVIEW OF SYSTEMS: Kathryn Ramsey Ramsey is doing well today.  She is due for treatment with Abraxane.  She had two weeks off and her energy level is improving.    Kathryn Ramsey Ramsey denies any concerns today. Her ANC is slightly low at 0.9.  Since her last visit with [name], she did see Dr. Denman George in follow up.  A detailed ROS was otherwise non contributory.    HISTORY OF PRESENT ILLNESS: From Dr. Serita Grit original intake note 03/17/2015:  "Kathryn Ramsey Ramsey is a very pleasant G4P4 who is seen in consultation at the request of Dr Collene Mares and Dr Garwin Brothers for peritoneal carcinomatosis. The patient has a history of a workup by urology for gross hematuria which included a pelvic examine was suggested for extrinsic mass effect on the bladder on cystoscopy. Cystoscopy took place on in December 2016. The patient denies abdominal pain bloating early satiety or abdominal  distention.  On 08/03/2015 at [facility] she underwent a CT scan of the abdomen and pelvis as ordered by Dr Baruch Gouty. This revealed a 1.4 cm low attenuation lesion on the posterior right hepatic lobe suggestive of a hemangioma. Status post hysterectomy. No ovarian masses. Moderate ascites. Omental caking seen in the lateral left abdomen and pelvis. Peritoneal nodularity in the left lateral pelvic cul-de-sac. No gross extrinsic compression on the bladder was identified on imaging.  The patient was then seen by her gastroenterologist, Dr. Collene Mares, who performed a colonoscopy which was unremarkable.  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 She was treated adjuvantly with carboplatin and paclitaxel, as detailed below. Her CA 125 dropped from 1226 on 03/20/2015 to 26.1 by 06/16/2015.  Her subsequent history is as detailed below.  I PAST MEDICAL HISTORY: Past Medical History:  Diagnosis Date  . Acute sensory neuropathy 06/26/2015  . Allergy   . Anemia   . Asthma    illness induced asthma  . Blood transfusion without reported diagnosis    at age 54 years old D/T surgery to femur being crushed  . 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 Heart murmur    pt, states had a "working heart  murmur"  . History of kidney stones   . PONV (postoperative nausea and vomiting)   . Skin cancer     PAST SURGICAL HISTORY: Past Surgical History:  Procedure Laterality Date  . 3 laporscopic proceedures    . ABDOMINAL HYSTERECTOMY     2010  . CHOLECYSTECTOMY     2010  . DILATION AND  CURETTAGE OF UTERUS     1997  . LAPAROSCOPY N/A 03/27/2015   Procedure: DIAGNOSTIC LAPAROSCOPY ;  Surgeon: [name], MD;  Location: [facility];  Service: Gynecology;  Laterality: N/A;  . LAPAROSCOPY N/A 02/24/2016   Procedure: LAPAROSCOPY DIAGNOSTIC WITH PERITONEAL WASHINGS AND PERITONEAL BIOPSY;  Surgeon: [name], MD;  Location: [facility];  Service: Gynecology;  Laterality: N/A;  . LAPAROTOMY N/A 03/27/2015   Procedure: EXPLORATORY LAPAROTOMY, BILATERAL SALPINGO OOPHORECTOMY, OMENTECTOMY, RADICAL TUMOR DEBULKING;  Surgeon: [name], MD;  Location: [facility];  Service: Gynecology;  Laterality: N/A;  . sinus surgery 1994      FAMILY HISTORY Family History  Problem Relation Age of Onset  . Hypertension Father   . Skin cancer Father        nonmelanoma skin cancers in his late 12s  . Non-Hodgkin's lymphoma Maternal Aunt        dx. 69s; smoker  . Stroke Maternal Grandmother   . Other Mother        benign meningioma dx. early-mid-70s; hysterectomy in her late 66s for heavy periods - still has ovaries  . Other Son        one son with pre-cancerous skin findings  . Other Daughter        cysts on ovaries and hx of heavy periods  . Other Sister        hysterectomy for cysts - still has ovaries  . Heart attack Maternal Grandfather   . Heart attack Paternal Grandfather   . Renal cancer Maternal Aunt 60       smoker  . Breast cancer Maternal Aunt 78  . Other Maternal Aunt        dx. benign brain tumor (meningioma) at 13; 2nd benign brain tumor in her late 11s; hx of radical hysterectomy at age 28  . Brain cancer Other        NOS tumor  The patient's parents are both living, in their late 69s as of January 2018. The patient has one brother, 3 sisters. There is no history of ovarian cancer in the family. One maternal aunt had breast and kidney cancer, another had non-Hodgkin's lymphoma.  GYNECOLOGIC HISTORY:  No LMP recorded. Patient has had a hysterectomy. 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 Her Kathryn Ramsey Ramsey is a Engineer, maintenance (IT). Their children are 59, 90, 34 and 40 y/o as of JAN 2018. The oldest works as an Electrical engineer in the [employer] in [place]. The other 3 children live in [place], the youngest currently attending [employer]. The patient has no grandchildren. She attends [employer]    ADVANCED DIRECTIVES:    HEALTH MAINTENANCE: Social History   Tobacco Use  . Smoking status: Never Smoker  . Smokeless tobacco: Never Used  Substance Use Topics  . Alcohol use: Yes    Comment:  3 glasses wine or beer/week  . Drug use: No     Colonoscopy:2016  PAP: s/p hyst  Bone density:   Allergies  Allergen Reactions  . Bee  Venom Shortness Of Breath    swelling  . Compazine [Prochlorperazine Edisylate] Anaphylaxis, Anxiety and Palpitations  . Sulfa Antibiotics Shortness Of Breath    Vomit , diarrhea , hives  . Iodinated Diagnostic Agents Other (See Comments)    01/27/17 Pt reports sneezing and congestion. Pt states she takes claritin as a premed for CT scan in [place]. Premedicated with Benadryl 25 mg PO. Post CT,no sneezing but had mild drainage/congestion which resolved after a few minutes with oral fluids.    Current Outpatient Medications  Medication Sig Dispense Refill  . fluticasone (FLONASE) 50 MCG/ACT nasal spray Place 1 spray into both nostrils 2 (two) times daily. (Patient taking differently: Place 1 spray into both nostrils as needed. ) 16 g 2  . loratadine (CLARITIN) 10 MG tablet Take 10 mg by mouth as needed.      No current facility-administered medications for this visit.     OBJECTIVE:  Vitals:   11/07/17 0905  BP: 103/70  Pulse: (!) 58  Resp: 18  Temp: 98.5 F (36.9 C)  SpO2: 100%     Body mass index is 20.97 kg/m.   ECOG FS:1 - Symptomatic but completely ambulatory GENERAL: Patient is a well appearing female in no acute distress HEENT:   Sclerae anicteric.  Oropharynx clear and moist. No ulcerations or evidence of oropharyngeal candidiasis. Neck is supple.  NODES:  No cervical, supraclavicular, or axillary lymphadenopathy palpated.  BREAST EXAM:  Deferred. LUNGS:  Clear to auscultation bilaterally.  No wheezes or rhonchi. HEART:  Regular rate and rhythm. No murmur appreciated. ABDOMEN:  Soft, mildly tender.  Positive, normoactive bowel sounds. No organomegaly palpated. MSK:  No focal spinal tenderness to palpation. Full range of motion bilaterally in the upper extremities. EXTREMITIES:  No peripheral edema.   SKIN:  Clear with no obvious rashes or skin changes. No nail dyscrasia. NEURO:  Nonfocal. Well oriented.  Appropriate affect.     LAB RESULTS:  CMP     Component Value Date/Time   NA 138 10/18/2017 0852   NA 140 02/11/2017 0755   K 4.4 10/18/2017 0852   K 4.4 02/11/2017 0755   CL 103 10/18/2017 0852   CO2 26 10/18/2017 0852   CO2 26 02/11/2017 0755   GLUCOSE 96 10/18/2017 0852   GLUCOSE 93 02/11/2017 0755   BUN 9 10/18/2017 0852   BUN 7.9 02/11/2017 0755   CREATININE 0.72 10/18/2017 0852   CREATININE 0.72 03/25/2017 0832   CREATININE 0.7 02/11/2017 0755   CALCIUM 9.9 10/18/2017 0852   CALCIUM 9.6 02/11/2017 0755   PROT 7.8 10/18/2017 0852   PROT 7.5 02/11/2017 0755   ALBUMIN 4.5 10/18/2017 0852   ALBUMIN 4.4 02/11/2017 0755   AST 24 10/18/2017 0852   AST 74 (H) 03/25/2017 0832   AST 27 02/11/2017 0755   ALT 24 10/18/2017 0852   ALT 155 (H) 03/25/2017 0832   ALT 24 02/11/2017 0755   ALKPHOS 60 10/18/2017 0852   ALKPHOS 58 02/11/2017 0755   BILITOT 0.4 10/18/2017 0852   BILITOT 0.4 03/25/2017 0832   BILITOT 0.43 02/11/2017 0755   GFRNONAA >60 10/18/2017 0852   GFRNONAA >60 03/25/2017 0832   GFRAA >60 10/18/2017 0852   GFRAA >60 03/25/2017 0832    INo results found for: SPEP, UPEP  Lab Results  Component Value Date   WBC 2.9 (L) 11/07/2017   NEUTROABS 0.9 (L) 11/07/2017   HGB 12.4  11/07/2017   HCT 38.4 11/07/2017   MCV 102.1 (H) 11/07/2017  PLT 257 11/07/2017      Chemistry      Component Value Date/Time   NA 138 10/18/2017 0852   NA 140 02/11/2017 0755   K 4.4 10/18/2017 0852   K 4.4 02/11/2017 0755   CL 103 10/18/2017 0852   CO2 26 10/18/2017 0852   CO2 26 02/11/2017 0755   BUN 9 10/18/2017 0852   BUN 7.9 02/11/2017 0755   CREATININE 0.72 10/18/2017 0852   CREATININE 0.72 03/25/2017 0832   CREATININE 0.7 02/11/2017 0755      Component Value Date/Time   CALCIUM 9.9 10/18/2017 0852   CALCIUM 9.6 02/11/2017 0755   ALKPHOS 60 10/18/2017 0852   ALKPHOS 58 02/11/2017 0755   AST 24 10/18/2017 0852   AST 74 (H) 03/25/2017 0832   AST 27 02/11/2017 0755   ALT 24 10/18/2017 0852   ALT 155 (H) 03/25/2017 0832   ALT 24 02/11/2017 0755   BILITOT 0.4 10/18/2017 0852   BILITOT 0.4 03/25/2017 0832   BILITOT 0.43 02/11/2017 0755       No results found for: LABCA2  No components found for: XAJOI786  No results for input(s): INR in the last 168 hours.  Urinalysis    Component Value Date/Time   COLORURINE STRAW (A) 04/08/2017 1123   APPEARANCEUR CLEAR 04/08/2017 1123   LABSPEC 1.003 (L) 04/08/2017 1123   LABSPEC 1.005 10/09/2015 1239   PHURINE 8.0 04/08/2017 1123   GLUCOSEU NEGATIVE 04/08/2017 1123   GLUCOSEU Negative 10/09/2015 1239   HGBUR NEGATIVE 04/08/2017 1123   BILIRUBINUR NEGATIVE 04/08/2017 1123   BILIRUBINUR Negative 10/09/2015 1239   KETONESUR NEGATIVE 04/08/2017 1123   PROTEINUR NEGATIVE 10/10/2017 1113   UROBILINOGEN 0.2 10/09/2015 1239   NITRITE NEGATIVE 04/08/2017 1123   LEUKOCYTESUR NEGATIVE 04/08/2017 1123   LEUKOCYTESUR Negative 10/09/2015 1239    STUDIES:  No results found.   ELIGIBLE FOR AVAILABLE RESEARCH PROTOCOL:  discussing protocols at [facility] at [place]: 51 y.o. BRCA negative [place] woman status post radical tumor debulking with optimal cytoreduction (R0) 03/27/2015 for a stage IIIC,  high-grade right fallopian tube carcinoma  (a) baseline CA-125 was1226.  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)  (c) tumor is PD-L1 negative (02/24/2016)  (d) tumor is strongly estrogen receptor positive, progesterone receptor negative (02/24/2016)  (1) adjuvant chemotherapy consisted of carboplatin and paclitaxel for 6 doses, begun 04/14/2015, completed 08/11/2015  (a) paclitaxel was omitted from cycle 5 and dose reduced on cycle 6 because of neuropathy   (b) a "make up" dose of paclitaxel was given 08/25/2015  (c) last carboplatin dose was 08/11/2015  (d) CA-125 normalized by 06/16/2015   (2) FIRST RECURRENCE: January 2018  (a) CA-125 rise beginning November 2017 led to CT scans and PET scans December 2017, all negative  (b) exploratory laparotomy 02/24/2016 showed pathologically confirmed recurrence, with miliary disease involving all examined surfaces  (c) port-site metastases noted 03/08/2016  (d) the January 2018 tumor sample was tested for the estrogen receptor and he was 80 percent positive with strong staining, progesterone receptor negative  (3) carboplatin/liposomal doxorubicin started 03/05/2016, repeated every three weeks x4, completed 06/04/2016.  (a) cycle 3 delayed one week because of neutropenia; OnPro added  (b) CA 125 normalized after cycle 3  (4) RUCAPARIB maintenance started 07/02/2016  (a) restaging 10/01/2016: Normal CA 125, negative CT of the abdomen and pelvis  (  b) labs 11/24/2016 shows a rise in her CA 125-43.4, with continuing rise thereafter  (c) rucaparib discontinued October 2018 with rising CA 125  (5) anastrozole started December 23, 2016-stopped after a couple of weeks  SECOND RECURRENCE: (6) Gemcitabine/Bevacizumab started on 02/04/2017  (a) gemcitabine omitted cycle 4 because of  intercurrent infection  (b) gemcitabine resumed with cycle 5, with intervening rise in  Ca 125  (c) repeat CT scan of the abdomen and pelvis on 05/26/2017 does not confirm obvious disease progression  (d) cycle 5 of gemcitabine/bevacizumab delayed 1 week   (e) gemcitabine/ bevacizumab discontinued after 6 cycles, with rising CA 125 (last dose 06/24/2017)  (7) foundation one testing did not show a high mutation burden and the microsatellite status was stable.  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  These were not immediately actionable  (8) Abraxane started 07/12/2017, repeated weekly x3 with neulasta day 16 first cycle  (a) cycle 2 started 08/09/2017, will be day 1 day 8 only  (b) cycle 3 scheduled to start 09/13/2017, day 1 day 8 at patient request  PLAN Jolissa is doing well today.  She continues to tolerate her treatment well, and has no signs of progression.  Her CA-125 is pending today.  Her ANC is 0.9.  I reviewed this with Dr. Lindi Adie.  She will receive Granix x 1 today and we will recheck CBC tomorrow and then she will proceed with treatment.  She will also likely benefit from 2-3 days of Granix between days 1 and 8 of her treatments.    We reviewed that she will need restaging scans after cycle 5 and prior to her follow up with Dr. Jana Hakim.  I reviewed this with Joylene John, NP and they recommend repeat CT abdomen/pelvis.  This is what I ordered.    She knows to call for any other issues that may develop before the next visit.  A total of (30) minutes of face-to-face time was spent with this patient with greater than 50% of that time in counseling and care-coordination.   Wilber Bihari, NP  11/07/17 9:25 AM Medical Oncology and Hematology [facility] [address] [place],  [address] Tel. [phone]    Fax. [phone]

## 2017-11-08 ENCOUNTER — Inpatient Hospital Stay: Payer: 59

## 2017-11-08 VITALS — BP 123/77 | HR 63 | Temp 98.5°F | Resp 16

## 2017-11-08 DIAGNOSIS — C762 Malignant neoplasm of abdomen: Secondary | ICD-10-CM

## 2017-11-08 DIAGNOSIS — Z7189 Other specified counseling: Secondary | ICD-10-CM

## 2017-11-08 DIAGNOSIS — C5701 Malignant neoplasm of right fallopian tube: Secondary | ICD-10-CM

## 2017-11-08 DIAGNOSIS — C801 Malignant (primary) neoplasm, unspecified: Principal | ICD-10-CM

## 2017-11-08 DIAGNOSIS — Z5111 Encounter for antineoplastic chemotherapy: Secondary | ICD-10-CM | POA: Diagnosis not present

## 2017-11-08 DIAGNOSIS — C786 Secondary malignant neoplasm of retroperitoneum and peritoneum: Secondary | ICD-10-CM

## 2017-11-08 LAB — CBC WITH DIFFERENTIAL/PLATELET
Basophils Absolute: 0 10*3/uL (ref 0.0–0.1)
Basophils Relative: 0 %
Eosinophils Absolute: 0 10*3/uL (ref 0.0–0.5)
Eosinophils Relative: 0 %
HCT: 37.2 % (ref 34.8–46.6)
Hemoglobin: 12.3 g/dL (ref 11.6–15.9)
Lymphocytes Relative: 10 %
Lymphs Abs: 2.7 10*3/uL (ref 0.9–3.3)
MCH: 33.6 pg (ref 25.1–34.0)
MCHC: 33.1 g/dL (ref 31.5–36.0)
MCV: 101.6 fL — ABNORMAL HIGH (ref 79.5–101.0)
Monocytes Absolute: 0.8 10*3/uL (ref 0.1–0.9)
Monocytes Relative: 3 %
Neutro Abs: 22.6 10*3/uL — ABNORMAL HIGH (ref 1.5–6.5)
Neutrophils Relative %: 87 %
Platelets: 244 10*3/uL (ref 145–400)
RBC: 3.66 MIL/uL — ABNORMAL LOW (ref 3.70–5.45)
RDW: 14.5 % (ref 11.2–14.5)
WBC: 26.2 10*3/uL — ABNORMAL HIGH (ref 3.9–10.3)

## 2017-11-08 LAB — CA 125: Cancer Antigen (CA) 125: 38.9 U/mL — ABNORMAL HIGH (ref 0.0–38.1)

## 2017-11-08 MED ORDER — ONDANSETRON HCL 8 MG PO TABS
ORAL_TABLET | ORAL | Status: AC
  Filled 2017-11-08: qty 1

## 2017-11-08 MED ORDER — SODIUM CHLORIDE 0.9 % IV SOLN
Freq: Once | INTRAVENOUS | Status: AC
  Administered 2017-11-08: 09:00:00 via INTRAVENOUS
  Filled 2017-11-08: qty 250

## 2017-11-08 MED ORDER — PACLITAXEL PROTEIN-BOUND CHEMO INJECTION 100 MG
100.0000 mg/m2 | Freq: Once | Status: DC
  Filled 2017-11-08: qty 30

## 2017-11-08 MED ORDER — ONDANSETRON HCL 8 MG PO TABS
8.0000 mg | ORAL_TABLET | Freq: Once | ORAL | Status: AC
  Administered 2017-11-08: 8 mg via ORAL

## 2017-11-08 MED ORDER — PACLITAXEL PROTEIN-BOUND CHEMO INJECTION 100 MG
100.0000 mg/m2 | Freq: Once | INTRAVENOUS | Status: AC
  Administered 2017-11-08: 150 mg via INTRAVENOUS
  Filled 2017-11-08: qty 30

## 2017-11-08 NOTE — Patient Instructions (Signed)
[place] Discharge Instructions for Patients Receiving Chemotherapy  Today you received the following chemotherapy agents: Paclitaxel-protein bound (Abraxane)  To help prevent nausea and vomiting after your treatment, we encourage you to take your nausea medication as prescribed.    If you develop nausea and vomiting that is not controlled by your nausea medication, call the clinic.   BELOW ARE SYMPTOMS THAT SHOULD BE REPORTED IMMEDIATELY:  *FEVER GREATER THAN 100.5 F  *CHILLS WITH OR WITHOUT FEVER  NAUSEA AND VOMITING THAT IS NOT CONTROLLED WITH YOUR NAUSEA MEDICATION  *UNUSUAL SHORTNESS OF BREATH  *UNUSUAL BRUISING OR BLEEDING  TENDERNESS IN MOUTH AND THROAT WITH OR WITHOUT PRESENCE OF ULCERS  *URINARY PROBLEMS  *BOWEL PROBLEMS  UNUSUAL RASH Items with * indicate a potential emergency and should be followed up as soon as possible.  Feel free to call the clinic should you have any questions or concerns. The clinic phone number is [phone].  Please show the [place] at check-in to the Emergency Department and triage nurse.

## 2017-11-09 ENCOUNTER — Encounter: Payer: Self-pay | Admitting: Adult Health

## 2017-11-10 ENCOUNTER — Telehealth: Payer: Self-pay | Admitting: Oncology

## 2017-11-10 ENCOUNTER — Other Ambulatory Visit: Payer: Self-pay | Admitting: Adult Health

## 2017-11-10 NOTE — Telephone Encounter (Signed)
Cancelled appt per 9/26 sch message - left message for patient with appt date and time.

## 2017-11-11 ENCOUNTER — Other Ambulatory Visit: Payer: Self-pay

## 2017-11-11 ENCOUNTER — Ambulatory Visit: Payer: 59

## 2017-11-12 ENCOUNTER — Ambulatory Visit: Payer: 59

## 2017-11-14 ENCOUNTER — Telehealth: Payer: Self-pay | Admitting: Oncology

## 2017-11-14 ENCOUNTER — Other Ambulatory Visit: Payer: Self-pay | Admitting: Adult Health

## 2017-11-14 ENCOUNTER — Inpatient Hospital Stay: Payer: 59

## 2017-11-14 ENCOUNTER — Other Ambulatory Visit: Payer: Self-pay | Admitting: Oncology

## 2017-11-14 DIAGNOSIS — C762 Malignant neoplasm of abdomen: Secondary | ICD-10-CM

## 2017-11-14 DIAGNOSIS — Z5111 Encounter for antineoplastic chemotherapy: Secondary | ICD-10-CM | POA: Diagnosis not present

## 2017-11-14 DIAGNOSIS — C5701 Malignant neoplasm of right fallopian tube: Secondary | ICD-10-CM

## 2017-11-14 LAB — CBC WITH DIFFERENTIAL/PLATELET
Basophils Absolute: 0 10*3/uL (ref 0.0–0.1)
Basophils Relative: 0 %
Eosinophils Absolute: 0 10*3/uL (ref 0.0–0.5)
Eosinophils Relative: 0 %
HCT: 34.5 % — ABNORMAL LOW (ref 34.8–46.6)
Hemoglobin: 11.7 g/dL (ref 11.6–15.9)
Lymphocytes Relative: 72 %
Lymphs Abs: 1.3 10*3/uL (ref 0.9–3.3)
MCH: 33.3 pg (ref 25.1–34.0)
MCHC: 34 g/dL (ref 31.5–36.0)
MCV: 97.9 fL (ref 79.5–101.0)
Monocytes Absolute: 0.1 10*3/uL (ref 0.1–0.9)
Monocytes Relative: 5 %
Neutro Abs: 0.4 10*3/uL — CL (ref 1.5–6.5)
Neutrophils Relative %: 23 %
Platelets: 172 10*3/uL (ref 145–400)
RBC: 3.52 MIL/uL — ABNORMAL LOW (ref 3.70–5.45)
RDW: 14.4 % (ref 11.2–14.5)
WBC: 1.9 10*3/uL — ABNORMAL LOW (ref 3.9–10.3)

## 2017-11-14 LAB — CMP (CANCER CENTER ONLY)
ALT: 22 U/L (ref 0–44)
AST: 21 U/L (ref 15–41)
Albumin: 4.4 g/dL (ref 3.5–5.0)
Alkaline Phosphatase: 66 U/L (ref 38–126)
Anion gap: 9 (ref 5–15)
BUN: 10 mg/dL (ref 6–20)
CO2: 28 mmol/L (ref 22–32)
Calcium: 9.6 mg/dL (ref 8.9–10.3)
Chloride: 104 mmol/L (ref 98–111)
Creatinine: 0.66 mg/dL (ref 0.44–1.00)
GFR, Est AFR Am: >60 mL/min (ref >60)
GFR, Estimated: >60 mL/min (ref >60)
Glucose, Bld: 89 mg/dL (ref 70–99)
Potassium: 3.9 mmol/L (ref 3.5–5.1)
Sodium: 141 mmol/L (ref 135–145)
Total Bilirubin: 0.5 mg/dL (ref 0.3–1.2)
Total Protein: 7.7 g/dL (ref 6.5–8.1)

## 2017-11-14 MED ORDER — TBO-FILGRASTIM 300 MCG/0.5ML ~~LOC~~ SOSY
300.0000 ug | PREFILLED_SYRINGE | Freq: Once | SUBCUTANEOUS | Status: AC
  Administered 2017-11-14: 300 ug via SUBCUTANEOUS

## 2017-11-14 MED ORDER — TBO-FILGRASTIM 300 MCG/0.5ML ~~LOC~~ SOSY
PREFILLED_SYRINGE | SUBCUTANEOUS | Status: AC
  Filled 2017-11-14: qty 0.5

## 2017-11-14 NOTE — Telephone Encounter (Signed)
Moved 10/1 lab back to 8:30 am due to lab meeting. Lab to be drawn in infusion - ok per [facility]. Left message for patient.

## 2017-11-14 NOTE — Patient Instructions (Signed)
Tbo-Filgrastim injection What is this medicine? TBO-FILGRASTIM (T B O fil GRA stim) is a granulocyte colony-stimulating factor that stimulates the growth of neutrophils, a type of white blood cell important in the body's fight against infection. It is used to reduce the incidence of fever and infection in patients with certain types of cancer who are receiving chemotherapy that affects the bone marrow. This medicine may be used for other purposes; ask your health care provider or pharmacist if you have questions. COMMON BRAND NAME(S): Granix What should I tell my health care provider before I take this medicine? They need to know if you have any of these conditions: -bone scan or tests planned -kidney disease -sickle cell anemia -an unusual or allergic reaction to tbo-filgrastim, filgrastim, pegfilgrastim, other medicines, foods, dyes, or preservatives -pregnant or trying to get pregnant -breast-feeding How should I use this medicine? This medicine is for injection under the skin. If you get this medicine at home, you will be taught how to prepare and give this medicine. Refer to the Instructions for Use that come with your medication packaging. Use exactly as directed. Take your medicine at regular intervals. Do not take your medicine more often than directed. It is important that you put your used needles and syringes in a special sharps container. Do not put them in a trash can. If you do not have a sharps container, call your pharmacist or healthcare provider to get one. Talk to your pediatrician regarding the use of this medicine in children. Special care may be needed. Overdosage: If you think you have taken too much of this medicine contact a poison control center or emergency room at once. NOTE: This medicine is only for you. Do not share this medicine with others. What if I miss a dose? It is important not to miss your dose. Call your doctor or health care professional if you miss a  dose. What may interact with this medicine? This medicine may interact with the following medications: -medicines that may cause a release of neutrophils, such as lithium This list may not describe all possible interactions. Give your health care provider a list of all the medicines, herbs, non-prescription drugs, or dietary supplements you use. Also tell them if you smoke, drink alcohol, or use illegal drugs. Some items may interact with your medicine. What should I watch for while using this medicine? You may need blood work done while you are taking this medicine. What side effects may I notice from receiving this medicine? Side effects that you should report to your doctor or health care professional as soon as possible: -allergic reactions like skin rash, itching or hives, swelling of the face, lips, or tongue -blood in the urine -dark urine -dizziness -fast heartbeat -feeling faint -shortness of breath or breathing problems -signs and symptoms of infection like fever or chills; cough; or sore throat -signs and symptoms of kidney injury like trouble passing urine or change in the amount of urine -stomach or side pain, or pain at the shoulder -sweating -swelling of the legs, ankles, or abdomen -tiredness Side effects that usually do not require medical attention (report to your doctor or health care professional if they continue or are bothersome): -bone pain -headache -muscle pain -vomiting This list may not describe all possible side effects. Call your doctor for medical advice about side effects. You may report side effects to FDA at 1-800-FDA-1088. Where should I keep my medicine? Keep out of the reach of children. Store in a refrigerator between   2 and 8 degrees C (36 and 46 degrees F). Keep in carton to protect from light. Throw away this medicine if it is left out of the refrigerator for more than 5 consecutive days. Throw away any unused medicine after the expiration  date. NOTE: This sheet is a summary. It may not cover all possible information. If you have questions about this medicine, talk to your doctor, pharmacist, or health care provider.  2018 Elsevier/Gold Standard (2015-03-24 19:07:04)  

## 2017-11-15 ENCOUNTER — Inpatient Hospital Stay: Payer: 59 | Attending: Oncology

## 2017-11-15 ENCOUNTER — Inpatient Hospital Stay: Payer: 59

## 2017-11-15 ENCOUNTER — Other Ambulatory Visit: Payer: Self-pay | Admitting: *Deleted

## 2017-11-15 VITALS — BP 116/64 | HR 61 | Temp 98.0°F | Resp 18

## 2017-11-15 DIAGNOSIS — C5701 Malignant neoplasm of right fallopian tube: Secondary | ICD-10-CM | POA: Diagnosis not present

## 2017-11-15 DIAGNOSIS — C762 Malignant neoplasm of abdomen: Secondary | ICD-10-CM

## 2017-11-15 DIAGNOSIS — Z5111 Encounter for antineoplastic chemotherapy: Secondary | ICD-10-CM | POA: Diagnosis present

## 2017-11-15 DIAGNOSIS — Z7189 Other specified counseling: Secondary | ICD-10-CM

## 2017-11-15 DIAGNOSIS — C801 Malignant (primary) neoplasm, unspecified: Secondary | ICD-10-CM

## 2017-11-15 DIAGNOSIS — C786 Secondary malignant neoplasm of retroperitoneum and peritoneum: Secondary | ICD-10-CM

## 2017-11-15 DIAGNOSIS — Z5189 Encounter for other specified aftercare: Secondary | ICD-10-CM | POA: Diagnosis present

## 2017-11-15 LAB — CBC WITH DIFFERENTIAL/PLATELET
Basophils Absolute: 0 10*3/uL (ref 0.0–0.1)
Basophils Relative: 0 %
Eosinophils Absolute: 0 10*3/uL (ref 0.0–0.5)
Eosinophils Relative: 0 %
HCT: 33.8 % — ABNORMAL LOW (ref 34.8–46.6)
Hemoglobin: 11.4 g/dL — ABNORMAL LOW (ref 11.6–15.9)
Lymphocytes Relative: 23 %
Lymphs Abs: 1.7 10*3/uL (ref 0.9–3.3)
MCH: 33.6 pg (ref 25.1–34.0)
MCHC: 33.8 g/dL (ref 31.5–36.0)
MCV: 99.5 fL (ref 79.5–101.0)
Monocytes Absolute: 0.2 10*3/uL (ref 0.1–0.9)
Monocytes Relative: 3 %
Neutro Abs: 5.3 10*3/uL (ref 1.5–6.5)
Neutrophils Relative %: 74 %
Platelets: 158 10*3/uL (ref 145–400)
RBC: 3.39 MIL/uL — ABNORMAL LOW (ref 3.70–5.45)
RDW: 14.1 % (ref 11.2–14.5)
WBC: 7.3 10*3/uL (ref 3.9–10.3)

## 2017-11-15 MED ORDER — PACLITAXEL PROTEIN-BOUND CHEMO INJECTION 100 MG
100.0000 mg/m2 | Freq: Once | INTRAVENOUS | Status: AC
  Administered 2017-11-15: 150 mg via INTRAVENOUS
  Filled 2017-11-15: qty 30

## 2017-11-15 MED ORDER — ONDANSETRON HCL 8 MG PO TABS
8.0000 mg | ORAL_TABLET | Freq: Once | ORAL | Status: AC
  Administered 2017-11-15: 8 mg via ORAL

## 2017-11-15 MED ORDER — SODIUM CHLORIDE 0.9 % IV SOLN
Freq: Once | INTRAVENOUS | Status: AC
  Administered 2017-11-15: 09:00:00 via INTRAVENOUS
  Filled 2017-11-15: qty 250

## 2017-11-15 MED ORDER — ONDANSETRON HCL 8 MG PO TABS
ORAL_TABLET | ORAL | Status: AC
  Filled 2017-11-15: qty 1

## 2017-11-15 NOTE — Progress Notes (Signed)
Ok to treat with new CBC from 10/1 and old CMP from 9/30 per [facility]

## 2017-11-15 NOTE — Patient Instructions (Signed)
[place] Discharge Instructions for Patients Receiving Chemotherapy  Today you received the following chemotherapy agent: Paclitaxel-protein bound (Abraxane)  To help prevent nausea and vomiting after your treatment, we encourage you to take your nausea medication as prescribed.    If you develop nausea and vomiting that is not controlled by your nausea medication, call the clinic.   BELOW ARE SYMPTOMS THAT SHOULD BE REPORTED IMMEDIATELY:  *FEVER GREATER THAN 100.5 F  *CHILLS WITH OR WITHOUT FEVER  NAUSEA AND VOMITING THAT IS NOT CONTROLLED WITH YOUR NAUSEA MEDICATION  *UNUSUAL SHORTNESS OF BREATH  *UNUSUAL BRUISING OR BLEEDING  TENDERNESS IN MOUTH AND THROAT WITH OR WITHOUT PRESENCE OF ULCERS  *URINARY PROBLEMS  *BOWEL PROBLEMS  UNUSUAL RASH Items with * indicate a potential emergency and should be followed up as soon as possible.  Feel free to call the clinic should you have any questions or concerns. The clinic phone number is [phone].  Please show the [place] at check-in to the Emergency Department and triage nurse.

## 2017-11-16 ENCOUNTER — Inpatient Hospital Stay: Payer: 59

## 2017-11-16 DIAGNOSIS — Z7189 Other specified counseling: Secondary | ICD-10-CM

## 2017-11-16 DIAGNOSIS — C5701 Malignant neoplasm of right fallopian tube: Secondary | ICD-10-CM | POA: Diagnosis not present

## 2017-11-16 DIAGNOSIS — C762 Malignant neoplasm of abdomen: Secondary | ICD-10-CM

## 2017-11-16 DIAGNOSIS — C801 Malignant (primary) neoplasm, unspecified: Secondary | ICD-10-CM

## 2017-11-16 DIAGNOSIS — C786 Secondary malignant neoplasm of retroperitoneum and peritoneum: Secondary | ICD-10-CM

## 2017-11-16 MED ORDER — PEGFILGRASTIM-CBQV 6 MG/0.6ML ~~LOC~~ SOSY
6.0000 mg | PREFILLED_SYRINGE | Freq: Once | SUBCUTANEOUS | Status: AC
  Administered 2017-11-16: 6 mg via SUBCUTANEOUS

## 2017-11-16 NOTE — Patient Instructions (Signed)
Pegfilgrastim injection What is this medicine? PEGFILGRASTIM (PEG fil gra stim) is a long-acting granulocyte colony-stimulating factor that stimulates the growth of neutrophils, a type of white blood cell important in the body's fight against infection. It is used to reduce the incidence of fever and infection in patients with certain types of cancer who are receiving chemotherapy that affects the bone marrow, and to increase survival after being exposed to high doses of radiation. This medicine may be used for other purposes; ask your health care provider or pharmacist if you have questions. COMMON BRAND NAME(S): Neulasta What should I tell my health care provider before I take this medicine? They need to know if you have any of these conditions: -kidney disease -latex allergy -ongoing radiation therapy -sickle cell disease -skin reactions to acrylic adhesives (On-Body Injector only) -an unusual or allergic reaction to pegfilgrastim, filgrastim, other medicines, foods, dyes, or preservatives -pregnant or trying to get pregnant -breast-feeding How should I use this medicine? This medicine is for injection under the skin. If you get this medicine at home, you will be taught how to prepare and give the pre-filled syringe or how to use the On-body Injector. Refer to the patient Instructions for Use for detailed instructions. Use exactly as directed. Tell your healthcare provider immediately if you suspect that the On-body Injector may not have performed as intended or if you suspect the use of the On-body Injector resulted in a missed or partial dose. It is important that you put your used needles and syringes in a special sharps container. Do not put them in a trash can. If you do not have a sharps container, call your pharmacist or healthcare provider to get one. Talk to your pediatrician regarding the use of this medicine in children. While this drug may be prescribed for selected conditions,  precautions do apply. Overdosage: If you think you have taken too much of this medicine contact a poison control center or emergency room at once. NOTE: This medicine is only for you. Do not share this medicine with others. What if I miss a dose? It is important not to miss your dose. Call your doctor or health care professional if you miss your dose. If you miss a dose due to an On-body Injector failure or leakage, a new dose should be administered as soon as possible using a single prefilled syringe for manual use. What may interact with this medicine? Interactions have not been studied. Give your health care provider a list of all the medicines, herbs, non-prescription drugs, or dietary supplements you use. Also tell them if you smoke, drink alcohol, or use illegal drugs. Some items may interact with your medicine. This list may not describe all possible interactions. Give your health care provider a list of all the medicines, herbs, non-prescription drugs, or dietary supplements you use. Also tell them if you smoke, drink alcohol, or use illegal drugs. Some items may interact with your medicine. What should I watch for while using this medicine? You may need blood work done while you are taking this medicine. If you are going to need a MRI, CT scan, or other procedure, tell your doctor that you are using this medicine (On-Body Injector only). What side effects may I notice from receiving this medicine? Side effects that you should report to your doctor or health care professional as soon as possible: -allergic reactions like skin rash, itching or hives, swelling of the face, lips, or tongue -dizziness -fever -pain, redness, or irritation at site   where injected -pinpoint red spots on the skin -red or dark-brown urine -shortness of breath or breathing problems -stomach or side pain, or pain at the shoulder -swelling -tiredness -trouble passing urine or change in the amount of urine Side  effects that usually do not require medical attention (report to your doctor or health care professional if they continue or are bothersome): -bone pain -muscle pain This list may not describe all possible side effects. Call your doctor for medical advice about side effects. You may report side effects to FDA at 1-800-FDA-1088. Where should I keep my medicine? Keep out of the reach of children. Store pre-filled syringes in a refrigerator between 2 and 8 degrees C (36 and 46 degrees F). Do not freeze. Keep in carton to protect from light. Throw away this medicine if it is left out of the refrigerator for more than 48 hours. Throw away any unused medicine after the expiration date. NOTE: This sheet is a summary. It may not cover all possible information. If you have questions about this medicine, talk to your doctor, pharmacist, or health care provider.  2018 Elsevier/Gold Standard (2016-01-29 12:58:03)  

## 2017-11-21 ENCOUNTER — Telehealth: Payer: Self-pay | Admitting: *Deleted

## 2017-11-21 ENCOUNTER — Other Ambulatory Visit: Payer: Self-pay | Admitting: *Deleted

## 2017-11-21 DIAGNOSIS — C762 Malignant neoplasm of abdomen: Secondary | ICD-10-CM

## 2017-11-21 DIAGNOSIS — C5701 Malignant neoplasm of right fallopian tube: Secondary | ICD-10-CM

## 2017-11-21 DIAGNOSIS — R509 Fever, unspecified: Secondary | ICD-10-CM

## 2017-11-21 NOTE — Telephone Encounter (Signed)
This RN retrieved VM at 346 pm left by pt at 230 pm stating " I woke up feeling kinda achy and cold - thought is was just some back pain so I took an Aleve but then about 2 hours after taking the Aleve I took my temp and it was 100.7 "  Per return call to pt - she states she took 2 tylenol post VM and " felt like my fever broke " " I am feeling better now "  Kathryn Ramsey stated she had been around a baby and small child last week who may have had a virus.  This RN discussed concern with above symptoms occurring while on chemo - and being immunocompromised.  While pt was on the phone this RN inquired with MD regarding above with instructions for pt to start Cipro tonight and come in the AM for lab work up.  Discussed with pt with verbalized understanding and scheduling of lab appt for 930 am.

## 2017-11-22 ENCOUNTER — Inpatient Hospital Stay: Payer: 59

## 2017-11-22 ENCOUNTER — Other Ambulatory Visit: Payer: Self-pay | Admitting: Oncology

## 2017-11-22 DIAGNOSIS — R509 Fever, unspecified: Secondary | ICD-10-CM

## 2017-11-22 DIAGNOSIS — C5701 Malignant neoplasm of right fallopian tube: Secondary | ICD-10-CM

## 2017-11-22 DIAGNOSIS — C762 Malignant neoplasm of abdomen: Secondary | ICD-10-CM

## 2017-11-22 LAB — URINALYSIS, COMPLETE (UACMP) WITH MICROSCOPIC
Bacteria, UA: NONE SEEN
Bilirubin Urine: NEGATIVE
Glucose, UA: NEGATIVE mg/dL
Hgb urine dipstick: NEGATIVE
Ketones, ur: NEGATIVE mg/dL
Leukocytes, UA: NEGATIVE
Nitrite: NEGATIVE
Protein, ur: NEGATIVE mg/dL
Specific Gravity, Urine: 1.002 — ABNORMAL LOW (ref 1.005–1.030)
pH: 7 (ref 5.0–8.0)

## 2017-11-23 LAB — URINE CULTURE: Culture: NO GROWTH

## 2017-11-24 ENCOUNTER — Telehealth: Payer: Self-pay

## 2017-11-24 ENCOUNTER — Other Ambulatory Visit: Payer: Self-pay | Admitting: Adult Health

## 2017-11-24 MED ORDER — PREDNISONE 50 MG PO TABS: ORAL_TABLET | ORAL | 0 refills | Status: AC

## 2017-11-24 MED ORDER — DIPHENHYDRAMINE HCL 50 MG PO CAPS: ORAL_CAPSULE | ORAL | 0 refills | Status: AC

## 2017-11-24 NOTE — Telephone Encounter (Signed)
Spoke with patient to inform that steroid prep was sent to her pharmacy for CT on Mon 10/14.  Patient stated that she will not take the prednisone because she has not taken it in the past and has been fine.  She said she will take Claritin as she has always done in the past because that has worked fine for her.  Patient voiced concerns for what prednisone may do to her and how it might make her feel.

## 2017-11-27 LAB — CULTURE, BLOOD (SINGLE): Culture: NO GROWTH

## 2017-11-28 ENCOUNTER — Other Ambulatory Visit: Payer: Self-pay | Admitting: Oncology

## 2017-11-28 ENCOUNTER — Ambulatory Visit (HOSPITAL_COMMUNITY)
Admission: RE | Admit: 2017-11-28 | Discharge: 2017-11-28 | Disposition: A | Discharge status: Home / Self Care | Payer: 59 | Source: Ambulatory Visit | Attending: Adult Health | Admitting: Adult Health

## 2017-11-28 ENCOUNTER — Encounter (HOSPITAL_COMMUNITY): Payer: Self-pay

## 2017-11-28 ENCOUNTER — Inpatient Hospital Stay: Payer: 59

## 2017-11-28 DIAGNOSIS — C5701 Malignant neoplasm of right fallopian tube: Secondary | ICD-10-CM | POA: Insufficient documentation

## 2017-11-28 DIAGNOSIS — C762 Malignant neoplasm of abdomen: Secondary | ICD-10-CM | POA: Diagnosis not present

## 2017-11-28 DIAGNOSIS — D701 Agranulocytosis secondary to cancer chemotherapy: Secondary | ICD-10-CM | POA: Diagnosis not present

## 2017-11-28 DIAGNOSIS — C801 Malignant (primary) neoplasm, unspecified: Secondary | ICD-10-CM

## 2017-11-28 DIAGNOSIS — C786 Secondary malignant neoplasm of retroperitoneum and peritoneum: Secondary | ICD-10-CM

## 2017-11-28 DIAGNOSIS — T451X5A Adverse effect of antineoplastic and immunosuppressive drugs, initial encounter: Secondary | ICD-10-CM | POA: Insufficient documentation

## 2017-11-28 LAB — CBC WITH DIFFERENTIAL/PLATELET
Abs Immature Granulocytes: 0.51 10*3/uL — ABNORMAL HIGH (ref 0.00–0.07)
Basophils Absolute: 0 10*3/uL (ref 0.0–0.1)
Basophils Relative: 1 %
Eosinophils Absolute: 0 10*3/uL (ref 0.0–0.5)
Eosinophils Relative: 0 %
HCT: 36.6 % (ref 36.0–46.0)
Hemoglobin: 11.5 g/dL — ABNORMAL LOW (ref 12.0–15.0)
Immature Granulocytes: 7 %
Lymphocytes Relative: 28 %
Lymphs Abs: 2.1 10*3/uL (ref 0.7–4.0)
MCH: 32 pg (ref 26.0–34.0)
MCHC: 31.4 g/dL (ref 30.0–36.0)
MCV: 101.9 fL — ABNORMAL HIGH (ref 80.0–100.0)
Monocytes Absolute: 0.5 10*3/uL (ref 0.1–1.0)
Monocytes Relative: 7 %
Neutro Abs: 4.3 10*3/uL (ref 1.7–7.7)
Neutrophils Relative %: 57 %
Platelets: 167 10*3/uL (ref 150–400)
RBC: 3.59 MIL/uL — ABNORMAL LOW (ref 3.87–5.11)
RDW: 15.2 % (ref 11.5–15.5)
WBC: 7.5 10*3/uL (ref 4.0–10.5)
nRBC: 0 % (ref 0.0–0.2)

## 2017-11-28 LAB — COMPREHENSIVE METABOLIC PANEL
ALT: 17 U/L (ref 0–44)
AST: 18 U/L (ref 15–41)
Albumin: 4.7 g/dL (ref 3.5–5.0)
Alkaline Phosphatase: 78 U/L (ref 38–126)
Anion gap: 7 (ref 5–15)
BUN: 11 mg/dL (ref 6–20)
CO2: 28 mmol/L (ref 22–32)
Calcium: 9.8 mg/dL (ref 8.9–10.3)
Chloride: 105 mmol/L (ref 98–111)
Creatinine, Ser: 0.66 mg/dL (ref 0.44–1.00)
GFR calc Af Amer: >60 mL/min (ref >60)
GFR calc non Af Amer: >60 mL/min (ref >60)
Glucose, Bld: 99 mg/dL (ref 70–99)
Potassium: 4.5 mmol/L (ref 3.5–5.1)
Sodium: 140 mmol/L (ref 135–145)
Total Bilirubin: 0.5 mg/dL (ref 0.3–1.2)
Total Protein: 7.7 g/dL (ref 6.5–8.1)

## 2017-11-28 MED ORDER — SODIUM CHLORIDE 0.9 % IJ SOLN
INTRAMUSCULAR | Status: AC
  Filled 2017-11-28: qty 50

## 2017-11-28 MED ORDER — IOHEXOL 300 MG/ML  SOLN
100.0000 mL | Freq: Once | INTRAMUSCULAR | Status: AC | PRN
  Administered 2017-11-28: 100 mL via INTRAVENOUS

## 2017-11-28 NOTE — Progress Notes (Signed)
I called Kathryn Ramsey with the results of her CT scan which are very favorable.  I phoned Dr. Jenkins Rouge back at [facility] and left a message and also fax number results.  I also sent notes to Drs. Denman George and [facility].  Hopefully we will hear from some of them at least before Kathryn Ramsey returns on Wednesday.  What we really need is a maintenance study.  I do not know if one is available.

## 2017-11-28 NOTE — Progress Notes (Signed)
[place]  [phone] [phone]     ID: Kathryn Ramsey DOB: 20-Apr-1965  MR#: [id number]  YKD#:[id number]  Patient Care Team: Harlan Stains, MD as PCP - General (Family Medicine) [name], MD as Consulting Physician (Obstetrics and Gynecology) Juanita Craver, MD as Consulting Physician (Gastroenterology) Servando Salina, MD as Consulting Physician (Obstetrics and Gynecology) Nickie Retort, MD as Consulting Physician (Urology) Gillis Ends, MD as Referring Physician (Obstetrics and Gynecology) OTHER MD: Festus Aloe [phone])  CHIEF COMPLAINT:  ovarian cancer  CURRENT TREATMENT: Abraxane  INTERVAL HISTORY: Kathryn Ramsey returns today for a follow-up and treatment of her recurrent ovarian cancer accompanied by her sister Opal Sidles, and husband. She is currently receiving Abraxane, given days 1, 8,  of each 21-day cycle, with tomorrow being day 1 cycle 5.  She receives [place] on day 9 of each cycle.  She is tolerating treatment well.  Her CA 125 has continued to decrease, and this level is pending today.  REVIEW OF SYSTEMS: Kathryn Ramsey is doing well today.  She is due for treatment with Abraxane.  She had two weeks off and her energy level is improving.    Glorious denies any concerns today. Her ANC is slightly low at 0.9.  Since her last visit with [name], she did see Dr. Denman George in follow up.  A detailed ROS was otherwise non contributory.    HISTORY OF PRESENT ILLNESS: From Dr. Serita Grit original intake note 03/17/2015:  "Imunique Samad is a very pleasant G4P4 who is seen in consultation at the request of Dr Collene Mares and Dr Garwin Brothers for peritoneal carcinomatosis. The patient has a history of a workup by urology for gross hematuria which included a pelvic examine was suggested for extrinsic mass effect on the bladder on cystoscopy. Cystoscopy took place on in December 2016. The patient denies abdominal pain bloating early satiety or abdominal  distention.  On 08/03/2015 at [facility] she underwent a CT scan of the abdomen and pelvis as ordered by Dr Baruch Gouty. This revealed a 1.4 cm low attenuation lesion on the posterior right hepatic lobe suggestive of a hemangioma. Status post hysterectomy. No ovarian masses. Moderate ascites. Omental caking seen in the lateral left abdomen and pelvis. Peritoneal nodularity in the left lateral pelvic cul-de-sac. No gross extrinsic compression on the bladder was identified on imaging.  The patient was then seen by her gastroenterologist, Dr. Collene Mares, who performed a colonoscopy which was unremarkable.  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 She was treated adjuvantly with carboplatin and paclitaxel, as detailed below. Her CA 125 dropped from 1226 on 03/20/2015 to 26.1 by 06/16/2015.  Her subsequent history is as detailed below.  I PAST MEDICAL HISTORY: Past Medical History:  Diagnosis Date  . Acute sensory neuropathy 06/26/2015  . Allergy   . Anemia   . Asthma    illness induced asthma  . Blood transfusion without reported diagnosis    at age 70 years old D/T surgery to femur being crushed  . 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 Heart murmur    pt, states had a "working heart  murmur"  . History of kidney stones   . PONV (postoperative nausea and vomiting)   . Skin cancer     PAST SURGICAL HISTORY: Past Surgical History:  Procedure Laterality Date  . 3 laporscopic proceedures    . ABDOMINAL HYSTERECTOMY     2010  . CHOLECYSTECTOMY     2010  . DILATION AND  CURETTAGE OF UTERUS     1997  . LAPAROSCOPY N/A 03/27/2015   Procedure: DIAGNOSTIC LAPAROSCOPY ;  Surgeon: [name], MD;  Location: [facility];  Service: Gynecology;  Laterality: N/A;  . LAPAROSCOPY N/A 02/24/2016   Procedure: LAPAROSCOPY DIAGNOSTIC WITH PERITONEAL WASHINGS AND PERITONEAL BIOPSY;  Surgeon: [name], MD;  Location: [facility];  Service: Gynecology;  Laterality: N/A;  . LAPAROTOMY N/A 03/27/2015   Procedure: EXPLORATORY LAPAROTOMY, BILATERAL SALPINGO OOPHORECTOMY, OMENTECTOMY, RADICAL TUMOR DEBULKING;  Surgeon: [name], MD;  Location: [facility];  Service: Gynecology;  Laterality: N/A;  . sinus surgery 1994      FAMILY HISTORY Family History  Problem Relation Age of Onset  . Hypertension Father   . Skin cancer Father        nonmelanoma skin cancers in his late 50s  . Non-Hodgkin's lymphoma Maternal Aunt        dx. 66s; smoker  . Stroke Maternal Grandmother   . Other Mother        benign meningioma dx. early-mid-70s; hysterectomy in her late 33s for heavy periods - still has ovaries  . Other Son        one son with pre-cancerous skin findings  . Other Daughter        cysts on ovaries and hx of heavy periods  . Other Sister        hysterectomy for cysts - still has ovaries  . Heart attack Maternal Grandfather   . Heart attack Paternal Grandfather   . Renal cancer Maternal Aunt 60       smoker  . Breast cancer Maternal Aunt 78  . Other Maternal Aunt        dx. benign brain tumor (meningioma) at 35; 2nd benign brain tumor in her late 25s; hx of radical hysterectomy at age 89  . Brain cancer Other        NOS tumor  The patient's parents are both living, in their late 68s as of January 2018. The patient has one brother, 3 sisters. There is no history of ovarian cancer in the family. One maternal aunt had breast and kidney cancer, another had non-Hodgkin's lymphoma.  GYNECOLOGIC HISTORY:  No LMP recorded. Patient has had a hysterectomy. 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 Her husband [name] is a Engineer, maintenance (IT). Their children are 37, 83, 29 and 26 y/o as of JAN 2018. The oldest works as an Electrical engineer in the [employer] in [place]. The other 3 children live in [place], the youngest currently attending [employer]. The patient has no grandchildren. She attends [employer]    ADVANCED DIRECTIVES:    HEALTH MAINTENANCE: Social History   Tobacco Use  . Smoking status: Never Smoker  . Smokeless tobacco: Never Used  Substance Use Topics  . Alcohol use: Yes    Comment:  3 glasses wine or beer/week  . Drug use: No     Colonoscopy:2016  PAP: s/p hyst  Bone density:   Allergies  Allergen Reactions  . Bee  Venom Shortness Of Breath    swelling  . Compazine [Prochlorperazine Edisylate] Anaphylaxis, Anxiety and Palpitations  . Sulfa Antibiotics Shortness Of Breath    Vomit , diarrhea , hives  . Iodinated Diagnostic Agents Other (See Comments)    01/27/17 Pt reports sneezing and congestion. Pt states she takes claritin as a premed for CT scan in [place]. Premedicated with Benadryl 25 mg PO. Post CT,no sneezing but had mild drainage/congestion which resolved after a few minutes with oral fluids.    Current Outpatient Medications  Medication Sig Dispense Refill  . diphenhydrAMINE (BENADRYL) 50 MG capsule 50 mg Diphenhydramine (Benadryl) PO within 1 hour of the injection 1 capsule 0  . fluticasone (FLONASE) 50 MCG/ACT nasal spray Place 1 spray into both nostrils 2 (two) times daily. (Patient taking differently: Place 1 spray into both nostrils as needed. ) 16 g 2  . loratadine (CLARITIN) 10 MG tablet Take 10 mg by mouth as needed.     . predniSONE (DELTASONE) 50 MG tablet 50 mg Prednisone PO 13, 7, and 1 hour before the injection 3 tablet 0   No current facility-administered medications for this visit.    Facility-Administered Medications Ordered in Other  Visits  Medication Dose Route Frequency Provider Last Rate Last Dose  . sodium chloride 0.9 % injection             OBJECTIVE:  There were no vitals filed for this visit.   There is no height or weight on file to calculate BMI.   ECOG FS:1 - Symptomatic but completely ambulatory GENERAL: Patient is a well appearing female in no acute distress HEENT:  Sclerae anicteric.  Oropharynx clear and moist. No ulcerations or evidence of oropharyngeal candidiasis. Neck is supple.  NODES:  No cervical, supraclavicular, or axillary lymphadenopathy palpated.  BREAST EXAM:  Deferred. LUNGS:  Clear to auscultation bilaterally.  No wheezes or rhonchi. HEART:  Regular rate and rhythm. No murmur appreciated. ABDOMEN:  Soft, mildly tender.  Positive, normoactive bowel sounds. No organomegaly palpated. MSK:  No focal spinal tenderness to palpation. Full range of motion bilaterally in the upper extremities. EXTREMITIES:  No peripheral edema.   SKIN:  Clear with no obvious rashes or skin changes. No nail dyscrasia. NEURO:  Nonfocal. Well oriented.  Appropriate affect.     LAB RESULTS:  CMP     Component Value Date/Time   NA 140 11/28/2017 0832   NA 140 02/11/2017 0755   K 4.5 11/28/2017 0832   K 4.4 02/11/2017 0755   CL 105 11/28/2017 0832   CO2 28 11/28/2017 0832   CO2 26 02/11/2017 0755   GLUCOSE 99 11/28/2017 0832   GLUCOSE 93 02/11/2017 0755   BUN 11 11/28/2017 0832   BUN 7.9 02/11/2017 0755   CREATININE 0.66 11/28/2017 0832   CREATININE 0.66 11/14/2017 0846   CREATININE 0.7 02/11/2017 0755   CALCIUM 9.8 11/28/2017 0832   CALCIUM 9.6 02/11/2017 0755   PROT 7.7 11/28/2017 0832   PROT 7.5 02/11/2017 0755   ALBUMIN 4.7 11/28/2017 0832   ALBUMIN 4.4 02/11/2017 0755   AST 18 11/28/2017 0832   AST 21 11/14/2017 0846   AST 27 02/11/2017 0755   ALT 17 11/28/2017 0832   ALT 22 11/14/2017 0846   ALT 24 02/11/2017 0755   ALKPHOS 78 11/28/2017 0832   ALKPHOS 58 02/11/2017 0755   BILITOT 0.5  11/28/2017 0832   BILITOT 0.5 11/14/2017 0846   BILITOT 0.43 02/11/2017 0755   GFRNONAA >60 11/28/2017 0240  GFRNONAA >60 11/14/2017 0846   GFRAA >60 11/28/2017 0832   GFRAA >60 11/14/2017 0846    INo results found for: SPEP, UPEP  Lab Results  Component Value Date   WBC 7.5 11/28/2017   NEUTROABS 4.3 11/28/2017   HGB 11.5 (L) 11/28/2017   HCT 36.6 11/28/2017   MCV 101.9 (H) 11/28/2017   PLT 167 11/28/2017      Chemistry      Component Value Date/Time   NA 140 11/28/2017 0832   NA 140 02/11/2017 0755   K 4.5 11/28/2017 0832   K 4.4 02/11/2017 0755   CL 105 11/28/2017 0832   CO2 28 11/28/2017 0832   CO2 26 02/11/2017 0755   BUN 11 11/28/2017 0832   BUN 7.9 02/11/2017 0755   CREATININE 0.66 11/28/2017 0832   CREATININE 0.66 11/14/2017 0846   CREATININE 0.7 02/11/2017 0755      Component Value Date/Time   CALCIUM 9.8 11/28/2017 0832   CALCIUM 9.6 02/11/2017 0755   ALKPHOS 78 11/28/2017 0832   ALKPHOS 58 02/11/2017 0755   AST 18 11/28/2017 0832   AST 21 11/14/2017 0846   AST 27 02/11/2017 0755   ALT 17 11/28/2017 0832   ALT 22 11/14/2017 0846   ALT 24 02/11/2017 0755   BILITOT 0.5 11/28/2017 0832   BILITOT 0.5 11/14/2017 0846   BILITOT 0.43 02/11/2017 0755       No results found for: LABCA2  No components found for: VHQIO962  No results for input(s): INR in the last 168 hours.  Urinalysis    Component Value Date/Time   COLORURINE STRAW (A) 11/22/2017 0940   APPEARANCEUR CLEAR 11/22/2017 0940   LABSPEC 1.002 (L) 11/22/2017 0940   LABSPEC 1.005 10/09/2015 1239   PHURINE 7.0 11/22/2017 0940   GLUCOSEU NEGATIVE 11/22/2017 0940   GLUCOSEU Negative 10/09/2015 1239   HGBUR NEGATIVE 11/22/2017 0940   BILIRUBINUR NEGATIVE 11/22/2017 0940   BILIRUBINUR Negative 10/09/2015 1239   KETONESUR NEGATIVE 11/22/2017 0940   PROTEINUR NEGATIVE 11/22/2017 0940   UROBILINOGEN 0.2 10/09/2015 1239   NITRITE NEGATIVE 11/22/2017 0940   LEUKOCYTESUR NEGATIVE 11/22/2017  0940   LEUKOCYTESUR Negative 10/09/2015 1239    STUDIES:  Ct Abdomen Pelvis W Contrast  Result Date: 11/28/2017 CLINICAL DATA:  Follow-up treatment for ovarian cancer EXAM: CT ABDOMEN AND PELVIS WITH CONTRAST TECHNIQUE: Multidetector CT imaging of the abdomen and pelvis was performed using the standard protocol following bolus administration of intravenous contrast. CONTRAST:  123m OMNIPAQUE IOHEXOL 300 MG/ML  SOLN COMPARISON:  05/26/2017 FINDINGS: Lower chest: No acute abnormality. Hepatobiliary: Right lobe of liver hemangioma is again noted measuring 1.5 cm. No suspicious liver lesions identified. Previous cholecystectomy. No biliary dilatation. Pancreas: Unremarkable. No pancreatic ductal dilatation or surrounding inflammatory changes. Spleen: Normal in size without focal abnormality. Adrenals/Urinary Tract: Normal adrenal glands. Normal appearance of both kidneys. No mass or hydronephrosis. The urinary bladder is normal. Stomach/Bowel: Stomach is unremarkable. The small bowel loops have a normal course and caliber without obstruction. No pathologic dilatation of the colon. Vascular/Lymphatic: Normal appearance of the abdominal aorta. No enlarged retroperitoneal or mesenteric adenopathy. No enlarged pelvic or inguinal lymph nodes. Reproductive: Previous hysterectomy.  No adnexal mass. Other: No ascites or focal fluid collections. The previously noted loculated fluid along the undersurface of right lobe of liver has resolved in the interval. No suspicious peritoneal nodule or mass identified. Musculoskeletal: No acute or significant osseous findings. IMPRESSION: 1. No evidence to suggest residual or recurrent tumor within the abdomen or pelvis.  2. Interval resolution of low-attenuation loculated fluid along the inferior margin of liver. Electronically Signed   By: Kerby Moors M.D.   On: 11/28/2017 13:59     ELIGIBLE FOR AVAILABLE RESEARCH PROTOCOL:  discussing protocols at [facility] at  [place]: 52 y.o. BRCA negative [place] woman status post radical tumor debulking with optimal cytoreduction (R0) 03/27/2015 for a stage IIIC, high-grade right fallopian tube carcinoma  (a) baseline CA-125 was1226.  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)  (c) tumor is PD-L1 negative (02/24/2016)  (d) tumor is strongly estrogen receptor positive, progesterone receptor negative (02/24/2016)  (1) adjuvant chemotherapy consisted of carboplatin and paclitaxel for 6 doses, begun 04/14/2015, completed 08/11/2015  (a) paclitaxel was omitted from cycle 5 and dose reduced on cycle 6 because of neuropathy   (b) a "make up" dose of paclitaxel was given 08/25/2015  (c) last carboplatin dose was 08/11/2015  (d) CA-125 normalized by 06/16/2015   (2) FIRST RECURRENCE: January 2018  (a) CA-125 rise beginning November 2017 led to CT scans and PET scans December 2017, all negative  (b) exploratory laparotomy 02/24/2016 showed pathologically confirmed recurrence, with miliary disease involving all examined surfaces  (c) port-site metastases noted 03/08/2016  (d) the January 2018 tumor sample was tested for the estrogen receptor and he was 80 percent positive with strong staining, progesterone receptor negative  (3) carboplatin/liposomal doxorubicin started 03/05/2016, repeated every three weeks x4, completed 06/04/2016.  (a) cycle 3 delayed one week because of neutropenia; OnPro added  (b) CA 125 normalized after cycle 3  (4) RUCAPARIB maintenance started 07/02/2016  (a) restaging 10/01/2016: Normal CA 125, negative CT of the abdomen and pelvis  (b) labs 11/24/2016 shows a rise in her CA 125-43.4, with continuing rise thereafter  (c) rucaparib discontinued October 2018 with rising CA 125  (5) anastrozole started  December 23, 2016-stopped after a couple of weeks  SECOND RECURRENCE: (6) Gemcitabine/Bevacizumab started on 02/04/2017  (a) gemcitabine omitted cycle 4 because of intercurrent infection  (b) gemcitabine resumed with cycle 5, with intervening rise in  Ca 125  (c) repeat CT scan of the abdomen and pelvis on 05/26/2017 does not confirm obvious disease progression  (d) cycle 5 of gemcitabine/bevacizumab delayed 1 week   (e) gemcitabine/ bevacizumab discontinued after 6 cycles, with rising CA 125 (last dose 06/24/2017)  (7) foundation one testing did not show a high mutation burden and the microsatellite status was stable.  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  These were not immediately actionable  (8) Abraxane started 07/12/2017, repeated days 1 and 8 of every 21-day cycle, completing 5 cycles 11/15/2017  (a) cycle 2 started 08/09/2017, will be day 1 day 8 only  (b) cycle 3 scheduled to start 09/13/2017, day 1 day 8 at patient request  (c) CT of the abdomen and pelvis 11/28/2017 shows no measurable or residual disease  (d) Ca125 has normalized  PLAN Rayshawn tolerated her treatment remarkably well and currently is back in remission.  The question is how to proceed at this point.  I will ask Dr. Mirian Capuchin opinion at [facility], as well as Dr. Theora Gianotti at [facility] and Dr. Denman George locally.  It would be optimal if there were a study that [facility] would qualify for at this time.  In the absence of that it is not clear whether we should go back  to some form of maintenance or simply observe.   Virgie Dad Magrinat MD  11/28/17 5:34 PM Medical Oncology and Hematology [facility] [address] [place], [place] [address] Tel. [phone]    Fax. [phone]

## 2017-11-29 LAB — CA 125: Cancer Antigen (CA) 125: 48.1 U/mL — ABNORMAL HIGH (ref 0.0–38.1)

## 2017-11-29 NOTE — Progress Notes (Signed)
[place]  [phone] [phone]     ID: [name] DOB: [birth year]  MR#: [id number]  DZH#:[id number]  Patient Care Team: Harlan Stains, MD as PCP - General (Family Medicine) [name], MD as Consulting Physician (Obstetrics and Gynecology) Juanita Craver, MD as Consulting Physician (Gastroenterology) Servando Salina, MD as Consulting Physician (Obstetrics and Gynecology) Nickie Retort, MD as Consulting Physician (Urology) Gillis Ends, MD as Referring Physician (Obstetrics and Gynecology) OTHER MD: Festus Aloe [phone])  CHIEF COMPLAINT:  ovarian cancer  CURRENT TREATMENT: Observation  INTERVAL HISTORY: Norena returns today for a follow-up and treatment of her recurrent ovarian cancer {accompanied by her husband. She is received Abraxane, given on days 1 and 8 of every 21 day cycle, between 07/12/2017 and 11/15/2017.  After 5 cycles, she was restaged.  She completed a CT abdomen and pelvis on 11/28/2017 showing: No evidence to suggest residual or recurrent tumor within the abdomen or pelvis. Interval resolution of low-attenuation loculated fluid along the inferior margin of liver.  Currently, 2 weeks after her most recent treatment, she feels fatigued. She has not been able to exercise as much as she would like. She also feels physically sore. She does not takes naps during the day and she sleeps well at night. Her appetite and bowel movements are normal. Her sinuses are better and her peripheral neuropathy has improved.   There has been a small bump in her Ca1 25  Ref Range & Units 1d ago 3wk ago 61moago 277mogo 28m728moo  Cancer Antigen (CA) 125 0.0 - 38.1 U/mL 48.1High   38.9High  CM 39.5High  CM 48.9High  CM 114.3High  CM     REVIEW OF SYSTEMS: MarCharneseports that other than her fatigue, she is well overall. She denies unusual headaches, visual changes, nausea, vomiting, or dizziness. There has been no  unusual cough, phlegm production, or pleurisy. There has been no change in bowel or bladder habits. She denies unexplained fatigue or unexplained weight loss, bleeding, rash, or fever. A detailed review of systems was otherwise stable.    HISTORY OF PRESENT ILLNESS: From Dr. RosSerita Gritiginal intake note 03/17/2015:  "MarLajean Boese a very pleasant G4P4 who is seen in consultation at the request of Dr ManCollene Maresd Dr CouGarwin Brothersr peritoneal carcinomatosis. The patient has a history of a workup by urology for gross hematuria which included a pelvic examine was suggested for extrinsic mass effect on the bladder on cystoscopy. Cystoscopy took place on in December 2016. The patient denies abdominal pain bloating early satiety or abdominal distention.  On 08/03/2015 at [facility] she underwent a CT scan of the abdomen and pelvis as ordered by Dr BriBaruch Goutyhis revealed a 1.4 cm low attenuation lesion on the posterior right hepatic lobe suggestive of a hemangioma. Status post hysterectomy. No ovarian masses. Moderate ascites. Omental caking seen in the lateral left abdomen and pelvis. Peritoneal nodularity in the left lateral pelvic cul-de-sac. No gross extrinsic compression on the bladder was identified on imaging.  The patient was then seen by her gastroenterologist, Dr. ManCollene Maresho performed a colonoscopy which was unremarkable.  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 She was  treated adjuvantly with carboplatin and paclitaxel, as detailed below. Her CA 125 dropped from 1226 on 03/20/2015 to 26.1 by 06/16/2015.  Her subsequent history is as detailed  below.  I PAST MEDICAL HISTORY: Past Medical History:  Diagnosis Date  . Acute sensory neuropathy 06/26/2015  . Allergy   . Anemia   . Asthma    illness induced asthma  . Blood transfusion without reported diagnosis    at age 58 years old D/T surgery to femur being crushed  . 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 Heart murmur    pt, states had a "working heart murmur"  . History of kidney stones   . PONV (postoperative nausea and vomiting)   . Skin cancer     PAST SURGICAL HISTORY: Past Surgical History:  Procedure Laterality Date  . 3 laporscopic proceedures    . ABDOMINAL HYSTERECTOMY     2010  . CHOLECYSTECTOMY     2010  . DILATION AND CURETTAGE OF UTERUS     1997  . LAPAROSCOPY N/A 03/27/2015   Procedure: DIAGNOSTIC LAPAROSCOPY ;  Surgeon: [name], MD;  Location: [facility];  Service: Gynecology;  Laterality: N/A;  . LAPAROSCOPY N/A 02/24/2016   Procedure: LAPAROSCOPY DIAGNOSTIC WITH PERITONEAL WASHINGS AND PERITONEAL BIOPSY;  Surgeon: [name], MD;  Location: [facility];  Service: Gynecology;  Laterality: N/A;  . LAPAROTOMY N/A 03/27/2015   Procedure: EXPLORATORY LAPAROTOMY, BILATERAL SALPINGO OOPHORECTOMY, OMENTECTOMY, RADICAL TUMOR DEBULKING;  Surgeon: [name], MD;  Location: [facility];  Service: Gynecology;  Laterality: N/A;  . sinus surgery 1994      FAMILY HISTORY Family History  Problem Relation Age of Onset  . Hypertension Father   . Skin cancer Father        nonmelanoma skin cancers in his late 78s  . Non-Hodgkin's lymphoma Maternal Aunt        dx. 51s; smoker  . Stroke Maternal Grandmother   . Other Mother        benign meningioma dx. early-mid-70s; hysterectomy in her late 11s for heavy periods - still has ovaries  . Other Son        one son with pre-cancerous skin findings  . Other Daughter        cysts on ovaries and hx of heavy periods  . Other Sister        hysterectomy for  cysts - still has ovaries  . Heart attack Maternal Grandfather   . Heart attack Paternal Grandfather   . Renal cancer Maternal Aunt 60       smoker  . Breast cancer Maternal Aunt 78  . Other Maternal Aunt        dx. benign brain tumor (meningioma) at 72; 2nd benign brain tumor in her late 9s; hx of radical hysterectomy at age 47  . Brain cancer Other        NOS tumor  The patient's parents are both living, in their late 74s as of January 2018. The patient has one brother, 3 sisters. There is no history of ovarian cancer in the family. One maternal aunt had breast and kidney cancer, another had non-Hodgkin's lymphoma.  GYNECOLOGIC HISTORY:  No LMP recorded. Patient has had a hysterectomy. 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 Her husband [name] is a  CPA. Their children are 88, 57, 10 and 69 y/o as of JAN 2018. The oldest works as an Electrical engineer in the [employer] in [place]. The other 3 children live in [place], the youngest currently attending [employer]. The patient has no grandchildren. She attends [employer]    ADVANCED DIRECTIVES:    HEALTH MAINTENANCE: Social History   Tobacco Use  . Smoking status: Never Smoker  . Smokeless tobacco: Never Used  Substance Use Topics  . Alcohol use: Yes    Comment:  3 glasses wine or beer/week  . Drug use: No     Colonoscopy:2016  PAP: s/p hyst  Bone density:   Allergies  Allergen Reactions  . Bee Venom Shortness Of Breath    swelling  . Compazine [Prochlorperazine Edisylate] Anaphylaxis, Anxiety and Palpitations  . Sulfa Antibiotics Shortness Of Breath    Vomit , diarrhea , hives  . Iodinated Diagnostic Agents Other (See Comments)    01/27/17 Pt reports sneezing and congestion. Pt states she takes claritin as a premed for CT scan in [place]. Premedicated with Benadryl 25 mg PO. Post CT,no sneezing but had mild  drainage/congestion which resolved after a few minutes with oral fluids.    Current Outpatient Medications  Medication Sig Dispense Refill  . diphenhydrAMINE (BENADRYL) 50 MG capsule 50 mg Diphenhydramine (Benadryl) PO within 1 hour of the injection 1 capsule 0  . fluticasone (FLONASE) 50 MCG/ACT nasal spray Place 1 spray into both nostrils 2 (two) times daily. (Patient taking differently: Place 1 spray into both nostrils as needed. ) 16 g 2  . loratadine (CLARITIN) 10 MG tablet Take 10 mg by mouth as needed.      No current facility-administered medications for this visit.     OBJECTIVE: Middle-aged white woman who appears well  Vitals:   11/30/17 0835  BP: 119/74  Pulse: 62  Resp: 18  Temp: 98.5 F (36.9 C)  SpO2: 100%     Body mass index is 22.07 kg/m.   ECOG FS:1 - Symptomatic but completely ambulatory  Sclerae unicteric, EOMs intact Oropharynx clear and moist No cervical or supraclavicular adenopathy Lungs no rales or rhonchi Heart regular rate and rhythm Abd soft, nontender, positive bowel sounds; no masses palpated; the port site metastases are not palpable MSK no focal spinal tenderness, no upper extremity lymphedema Neuro: nonfocal, well oriented, appropriate affect Breasts: Deferred      LAB RESULTS:  CMP     Component Value Date/Time   NA 140 11/28/2017 0832   NA 140 02/11/2017 0755   K 4.5 11/28/2017 0832   K 4.4 02/11/2017 0755   CL 105 11/28/2017 0832   CO2 28 11/28/2017 0832   CO2 26 02/11/2017 0755   GLUCOSE 99 11/28/2017 0832   GLUCOSE 93 02/11/2017 0755   BUN 11 11/28/2017 0832   BUN 7.9 02/11/2017 0755   CREATININE 0.66 11/28/2017 0832   CREATININE 0.66 11/14/2017 0846   CREATININE 0.7 02/11/2017 0755   CALCIUM 9.8 11/28/2017 0832   CALCIUM 9.6 02/11/2017 0755   PROT 7.7 11/28/2017 0832   PROT 7.5 02/11/2017 0755   ALBUMIN 4.7 11/28/2017 0832   ALBUMIN 4.4 02/11/2017 0755   AST 18 11/28/2017 0832   AST 21 11/14/2017 0846   AST 27  02/11/2017 0755   ALT 17 11/28/2017 0832   ALT 22 11/14/2017 0846   ALT 24 02/11/2017 0755   ALKPHOS 78 11/28/2017 0832   ALKPHOS 58 02/11/2017 0755   BILITOT 0.5 11/28/2017 2426  BILITOT 0.5 11/14/2017 0846   BILITOT 0.43 02/11/2017 0755   GFRNONAA >60 11/28/2017 0832   GFRNONAA >60 11/14/2017 0846   GFRAA >60 11/28/2017 0832   GFRAA >60 11/14/2017 0846    INo results found for: SPEP, UPEP  Lab Results  Component Value Date   WBC 7.5 11/28/2017   NEUTROABS 4.3 11/28/2017   HGB 11.5 (L) 11/28/2017   HCT 36.6 11/28/2017   MCV 101.9 (H) 11/28/2017   PLT 167 11/28/2017      Chemistry      Component Value Date/Time   NA 140 11/28/2017 0832   NA 140 02/11/2017 0755   K 4.5 11/28/2017 0832   K 4.4 02/11/2017 0755   CL 105 11/28/2017 0832   CO2 28 11/28/2017 0832   CO2 26 02/11/2017 0755   BUN 11 11/28/2017 0832   BUN 7.9 02/11/2017 0755   CREATININE 0.66 11/28/2017 0832   CREATININE 0.66 11/14/2017 0846   CREATININE 0.7 02/11/2017 0755      Component Value Date/Time   CALCIUM 9.8 11/28/2017 0832   CALCIUM 9.6 02/11/2017 0755   ALKPHOS 78 11/28/2017 0832   ALKPHOS 58 02/11/2017 0755   AST 18 11/28/2017 0832   AST 21 11/14/2017 0846   AST 27 02/11/2017 0755   ALT 17 11/28/2017 0832   ALT 22 11/14/2017 0846   ALT 24 02/11/2017 0755   BILITOT 0.5 11/28/2017 0832   BILITOT 0.5 11/14/2017 0846   BILITOT 0.43 02/11/2017 0755       No results found for: LABCA2  No components found for: [id number]  No results for input(s): INR in the last 168 hours.  Urinalysis    Component Value Date/Time   COLORURINE STRAW (A) 11/22/2017 0940   APPEARANCEUR CLEAR 11/22/2017 0940   LABSPEC 1.002 (L) 11/22/2017 0940   LABSPEC 1.005 10/09/2015 1239   PHURINE 7.0 11/22/2017 0940   GLUCOSEU NEGATIVE 11/22/2017 0940   GLUCOSEU Negative 10/09/2015 1239   HGBUR NEGATIVE 11/22/2017 0940   BILIRUBINUR NEGATIVE 11/22/2017 0940   BILIRUBINUR Negative 10/09/2015 1239   KETONESUR  NEGATIVE 11/22/2017 0940   PROTEINUR NEGATIVE 11/22/2017 0940   UROBILINOGEN 0.2 10/09/2015 1239   NITRITE NEGATIVE 11/22/2017 0940   LEUKOCYTESUR NEGATIVE 11/22/2017 0940   LEUKOCYTESUR Negative 10/09/2015 1239    STUDIES:  Ct Abdomen Pelvis W Contrast  Result Date: 11/28/2017 CLINICAL DATA:  Follow-up treatment for ovarian cancer EXAM: CT ABDOMEN AND PELVIS WITH CONTRAST TECHNIQUE: Multidetector CT imaging of the abdomen and pelvis was performed using the standard protocol following bolus administration of intravenous contrast. CONTRAST:  17m OMNIPAQUE IOHEXOL 300 MG/ML  SOLN COMPARISON:  05/26/2017 FINDINGS: Lower chest: No acute abnormality. Hepatobiliary: Right lobe of liver hemangioma is again noted measuring 1.5 cm. No suspicious liver lesions identified. Previous cholecystectomy. No biliary dilatation. Pancreas: Unremarkable. No pancreatic ductal dilatation or surrounding inflammatory changes. Spleen: Normal in size without focal abnormality. Adrenals/Urinary Tract: Normal adrenal glands. Normal appearance of both kidneys. No mass or hydronephrosis. The urinary bladder is normal. Stomach/Bowel: Stomach is unremarkable. The small bowel loops have a normal course and caliber without obstruction. No pathologic dilatation of the colon. Vascular/Lymphatic: Normal appearance of the abdominal aorta. No enlarged retroperitoneal or mesenteric adenopathy. No enlarged pelvic or inguinal lymph nodes. Reproductive: Previous hysterectomy.  No adnexal mass. Other: No ascites or focal fluid collections. The previously noted loculated fluid along the undersurface of right lobe of liver has resolved in the interval. No suspicious peritoneal nodule or mass identified. Musculoskeletal: No acute or  significant osseous findings. IMPRESSION: 1. No evidence to suggest residual or recurrent tumor within the abdomen or pelvis. 2. Interval resolution of low-attenuation loculated fluid along the inferior margin of liver.  Electronically Signed   By: Kerby Moors M.D.   On: 11/28/2017 13:59     ELIGIBLE FOR AVAILABLE RESEARCH PROTOCOL:  discussing protocols at [facility] [place]: 52 y.o. BRCA negative [place] woman status post radical tumor debulking with optimal cytoreduction (R0) 03/27/2015 for a stage IIIC, high-grade right fallopian tube carcinoma  (a) baseline CA-125 was 1226.  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)  (c) tumor is PD-L1 negative (02/24/2016)  (d) tumor is strongly estrogen receptor positive, progesterone receptor negative (02/24/2016)  (1) adjuvant chemotherapy consisted of carboplatin and paclitaxel for 6 doses, begun 04/14/2015, completed 08/11/2015  (a) paclitaxel was omitted from cycle 5 and dose reduced on cycle 6 because of neuropathy   (b) a "make up" dose of paclitaxel was given 08/25/2015  (c) last carboplatin dose was 08/11/2015  (d) CA-125 normalized by 06/16/2015   (2) FIRST RECURRENCE: January 2018  (a) CA-125 rise beginning November 2017 led to CT scans and PET scans December 2017, all negative  (b) exploratory laparotomy 02/24/2016 showed pathologically confirmed recurrence, with miliary disease involving all examined surfaces  (c) port-site metastases noted 03/08/2016  (d) the January 2018 tumor sample was tested for the estrogen receptor and he was 80 percent positive with strong staining, progesterone receptor negative  (3) carboplatin/liposomal doxorubicin started 03/05/2016, repeated every three weeks x4, completed 06/04/2016.  (a) cycle 3 delayed one week because of neutropenia; OnPro added  (b) CA 125 normalized after cycle 3  (4) RUCAPARIB maintenance started 07/02/2016  (a) restaging 10/01/2016: Normal CA 125, negative CT of the abdomen and pelvis  (b) labs 11/24/2016  shows a rise in her CA 125-43.4, with continuing rise thereafter  (c) rucaparib discontinued October 2018 with rising CA 125  (5) anastrozole started December 23, 2016-stopped after a couple of weeks with poor tolerance  SECOND RECURRENCE: (6) Gemcitabine/Bevacizumab started on 02/04/2017  (a) gemcitabine omitted cycle 4 because of intercurrent infection  (b) gemcitabine resumed with cycle 5, with intervening rise in  Ca 125  (c) repeat CT scan of the abdomen and pelvis on 05/26/2017 does not confirm obvious disease progression  (d) cycle 5 of gemcitabine/bevacizumab delayed 1 week   (e) gemcitabine/ bevacizumab discontinued after 6 cycles, with rising CA 125 (last dose 06/24/2017)  (7) foundation one testing did not show a high mutation burden and the microsatellite status was stable.  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  These were not immediately actionable  (8) Abraxane started 07/12/2017, given weekly or weeks 1 and 2 of each 3 weeks cycle, last dose 11/15/2017 (5 cycles)  (a) cycle 2 started 08/09/2017, will be day 1 day 8 only  (b) cycle 3 scheduled to start 09/13/2017, day 1 day 8 at patient request  (c) CT of the abdomen and pelvis 11/28/2017 shows no measurable disease  PLAN Ofelia did very well with her chemotherapy.  She had no peripheral neuropathy.  She did require some growth factor support to maintain the dosing intensity.  She does not do well with Neulasta but tolerates Neupogen well.  Her Ca1 25 normalized and her repeat CT scan of the abdomen and pelvis shows no measurable disease,  and complete resolution of the earlier subtle findings.  Her Ca1 25 is stirring some.  We discussed going back on a part inhibitor, continuing chemotherapy, or taking a "holiday".  That is what I would suggest.  That was also Dr. Serita Grit recommendation.  I have sent her information to Dr. Theora Gianotti at [facility] and Dr. Robina Ade at [facility] and I will be interested in their opinion  regarding this decision.  Certainly if we had a maintenance study that Avalee would qualify for I would encourage her to participate  Otherwise the plan is to monitor her labs every 4 weeks (I offered her I brought her monitoring interval but she prefers monthly) and she will see me again in January.  At that point we can consider whether or not restaging studies should be obtained and whether further resumption of treatment is indicated  She knows to call for any other issues that may develop before the next visit. Furqan Gosselin, Virgie Dad, MD  11/30/17 8:57 AM Medical Oncology and Hematology [facility] [address] [place], [place] [address] Tel. [phone]    Fax. [phone]  Alice Rieger, am acting as scribe for Chauncey Cruel MD.  I, Lurline Del MD, have reviewed the above documentation for accuracy and completeness, and I agree with the above.

## 2017-11-30 ENCOUNTER — Inpatient Hospital Stay (HOSPITAL_BASED_OUTPATIENT_CLINIC_OR_DEPARTMENT_OTHER): Payer: 59 | Admitting: Oncology

## 2017-11-30 ENCOUNTER — Other Ambulatory Visit: Payer: Self-pay | Admitting: Oncology

## 2017-11-30 ENCOUNTER — Telehealth: Payer: Self-pay | Admitting: Oncology

## 2017-11-30 ENCOUNTER — Other Ambulatory Visit: Payer: 59

## 2017-11-30 VITALS — BP 119/74 | HR 62 | Temp 98.5°F | Resp 18 | Ht 63.0 in | Wt 124.6 lb

## 2017-11-30 DIAGNOSIS — N951 Menopausal and female climacteric states: Secondary | ICD-10-CM

## 2017-11-30 DIAGNOSIS — C786 Secondary malignant neoplasm of retroperitoneum and peritoneum: Secondary | ICD-10-CM

## 2017-11-30 DIAGNOSIS — C5701 Malignant neoplasm of right fallopian tube: Secondary | ICD-10-CM | POA: Diagnosis not present

## 2017-11-30 DIAGNOSIS — Z23 Encounter for immunization: Secondary | ICD-10-CM

## 2017-11-30 DIAGNOSIS — C762 Malignant neoplasm of abdomen: Secondary | ICD-10-CM

## 2017-11-30 DIAGNOSIS — C801 Malignant (primary) neoplasm, unspecified: Principal | ICD-10-CM

## 2017-11-30 MED ORDER — INFLUENZA VAC SPLIT QUAD 0.5 ML IM SUSY
PREFILLED_SYRINGE | INTRAMUSCULAR | Status: AC
  Filled 2017-11-30: qty 0.5

## 2017-11-30 MED ORDER — INFLUENZA VAC SPLIT QUAD 0.5 ML IM SUSY: 0.5000 mL | PREFILLED_SYRINGE | Freq: Once | INTRAMUSCULAR | Status: DC

## 2017-11-30 NOTE — Progress Notes (Signed)
Fluarix Quad given today lot [id number], EXP 06/20, [employer]oSmithK[employer], right arm

## 2017-11-30 NOTE — Telephone Encounter (Signed)
Per 10/16 decline avs and calendar

## 2017-12-01 LAB — CBC WITH DIFFERENTIAL (CANCER CENTER ONLY)
Band Neutrophils: 16 %
Basophils Absolute: 0.1 10*3/uL (ref 0.0–0.1)
Basophils Relative: 1 %
Eosinophils Absolute: 0.1 10*3/uL (ref 0.0–0.5)
Eosinophils Relative: 0 %
HCT: 31.3 % — ABNORMAL LOW (ref 36.0–46.0)
Hemoglobin: 10 g/dL — ABNORMAL LOW (ref 12.0–15.0)
Lymphocytes Relative: 20 %
Lymphs Abs: 3.2 10*3/uL (ref 0.7–4.0)
MCH: 32.4 pg (ref 26.0–34.0)
MCHC: 31.9 g/dL (ref 30.0–36.0)
MCV: 101.3 fL — ABNORMAL HIGH (ref 80.0–100.0)
Metamyelocytes Relative: 2 %
Monocytes Absolute: 2.5 10*3/uL — ABNORMAL HIGH (ref 0.1–1.0)
Monocytes Relative: 16 %
Myelocytes: 3 %
Neutro Abs: 7.6 10*3/uL (ref 1.7–7.7)
Neutrophils Relative %: 43 %
Platelet Count: 165 10*3/uL (ref 150–400)
RBC: 3.09 MIL/uL — ABNORMAL LOW (ref 3.87–5.11)
RDW: 14.8 % (ref 11.5–15.5)
WBC Count: 15.9 10*3/uL — ABNORMAL HIGH (ref 4.0–10.5)
nRBC: 0.9 % — ABNORMAL HIGH (ref 0.0–0.2)

## 2017-12-05 ENCOUNTER — Other Ambulatory Visit: Payer: Self-pay | Admitting: Oncology

## 2017-12-05 NOTE — Progress Notes (Unsigned)
Received this note from Dr. Theora Gianotti:  Theora Gianotti, Venida Jarvis, MD  Anetha Slagel, Virgie Dad, MD        Hello,  I would not treat with a PARPi - she is BRCA wild-type and PARPi for treatment is not approved outside of this population. So exciting that she is with evidence of disease after this last chemotherapy treatment. If she recurs she may be eligible for a PARPi trial. Is she HRD positive?  Thank you and feel free to call me [phone]. I am only in [place] 2 days out of this month and do not regularly check this Epic account.  Best wishes  Sincerely  [place]

## 2017-12-06 ENCOUNTER — Telehealth: Payer: Self-pay | Admitting: Neurology

## 2017-12-06 MED ORDER — GABAPENTIN 100 MG PO CAPS: 100.0000 mg | ORAL_CAPSULE | Freq: Three times a day (TID) | ORAL | 3 refills | Status: DC

## 2017-12-06 NOTE — Telephone Encounter (Signed)
The patient ran out of the gabapentin and the neuropathic pain has worsened, a new Rx will be sent in.

## 2017-12-22 ENCOUNTER — Encounter: Payer: Self-pay | Admitting: Oncology

## 2017-12-27 ENCOUNTER — Inpatient Hospital Stay: Payer: 59 | Attending: Oncology

## 2017-12-28 ENCOUNTER — Other Ambulatory Visit: Payer: Self-pay | Admitting: Oncology

## 2018-01-16 ENCOUNTER — Inpatient Hospital Stay: Payer: 59 | Attending: Oncology

## 2018-01-16 ENCOUNTER — Other Ambulatory Visit: Payer: Self-pay | Admitting: *Deleted

## 2018-01-16 DIAGNOSIS — C762 Malignant neoplasm of abdomen: Secondary | ICD-10-CM | POA: Diagnosis present

## 2018-01-16 DIAGNOSIS — C5701 Malignant neoplasm of right fallopian tube: Secondary | ICD-10-CM

## 2018-01-16 LAB — CMP (CANCER CENTER ONLY)
ALT: 19 U/L (ref 0–44)
AST: 25 U/L (ref 15–41)
Albumin: 4.5 g/dL (ref 3.5–5.0)
Alkaline Phosphatase: 60 U/L (ref 38–126)
Anion gap: 9 (ref 5–15)
BUN: 9 mg/dL (ref 6–20)
CO2: 27 mmol/L (ref 22–32)
Calcium: 9.6 mg/dL (ref 8.9–10.3)
Chloride: 104 mmol/L (ref 98–111)
Creatinine: 0.79 mg/dL (ref 0.44–1.00)
GFR, Est AFR Am: >60 mL/min (ref >60)
GFR, Estimated: >60 mL/min (ref >60)
Glucose, Bld: 95 mg/dL (ref 70–99)
Potassium: 4.4 mmol/L (ref 3.5–5.1)
Sodium: 140 mmol/L (ref 135–145)
Total Bilirubin: 0.5 mg/dL (ref 0.3–1.2)
Total Protein: 7.8 g/dL (ref 6.5–8.1)

## 2018-01-16 LAB — CBC WITH DIFFERENTIAL (CANCER CENTER ONLY)
Abs Immature Granulocytes: 0.01 10*3/uL (ref 0.00–0.07)
Basophils Absolute: 0 10*3/uL (ref 0.0–0.1)
Basophils Relative: 0 %
Eosinophils Absolute: 0 10*3/uL (ref 0.0–0.5)
Eosinophils Relative: 0 %
HCT: 40.5 % (ref 36.0–46.0)
Hemoglobin: 13.1 g/dL (ref 12.0–15.0)
Immature Granulocytes: 0 %
Lymphocytes Relative: 36 %
Lymphs Abs: 1.4 10*3/uL (ref 0.7–4.0)
MCH: 31.9 pg (ref 26.0–34.0)
MCHC: 32.3 g/dL (ref 30.0–36.0)
MCV: 98.5 fL (ref 80.0–100.0)
Monocytes Absolute: 0.2 10*3/uL (ref 0.1–1.0)
Monocytes Relative: 5 %
Neutro Abs: 2.3 10*3/uL (ref 1.7–7.7)
Neutrophils Relative %: 59 %
Platelet Count: 217 10*3/uL (ref 150–400)
RBC: 4.11 MIL/uL (ref 3.87–5.11)
RDW: 12.8 % (ref 11.5–15.5)
WBC Count: 3.9 10*3/uL — ABNORMAL LOW (ref 4.0–10.5)
nRBC: 0 % (ref 0.0–0.2)

## 2018-01-17 ENCOUNTER — Encounter: Payer: Self-pay | Admitting: Oncology

## 2018-01-17 ENCOUNTER — Ambulatory Visit (HOSPITAL_COMMUNITY)
Admission: RE | Admit: 2018-01-17 | Discharge: 2018-01-17 | Disposition: A | Discharge status: Home / Self Care | Payer: 59 | Source: Ambulatory Visit | Attending: Oncology | Admitting: Oncology

## 2018-01-17 ENCOUNTER — Telehealth: Payer: Self-pay | Admitting: *Deleted

## 2018-01-17 ENCOUNTER — Other Ambulatory Visit: Payer: Self-pay | Admitting: *Deleted

## 2018-01-17 DIAGNOSIS — R140 Abdominal distension (gaseous): Secondary | ICD-10-CM

## 2018-01-17 LAB — CA 125: Cancer Antigen (CA) 125: 1372 U/mL — ABNORMAL HIGH (ref 0.0–38.1)

## 2018-01-17 LAB — CANCER ANTIGEN 27.29: CA 27.29: 27.4 U/mL (ref 0.0–38.6)

## 2018-01-17 MED ORDER — IOHEXOL 300 MG/ML  SOLN
100.0000 mL | Freq: Once | INTRAMUSCULAR | Status: AC | PRN
  Administered 2018-01-17: 100 mL via INTRAVENOUS

## 2018-01-17 MED ORDER — SODIUM CHLORIDE (PF) 0.9 % IJ SOLN
INTRAMUSCULAR | Status: AC
  Filled 2018-01-17: qty 50

## 2018-01-17 NOTE — Telephone Encounter (Signed)
This RN spoke with pt per noted elevated tumor marker in setting of recurrent symptoms.  Kathryn Ramsey states over the past week she has had increased nausea , abd pain with heaviness and noted " filling kinda full " Kathryn Ramsey states bowels are moving with diarrhea occurring on Sunday and formed stool this am.  Per MD- recommendation is for pt to obtain CT for evaluation.  Pt is agreeable to plan.  This RN called Central Scheduling and obtained appointment for this afternoon.  Pt aware.

## 2018-01-18 ENCOUNTER — Other Ambulatory Visit: Payer: Self-pay | Admitting: Oncology

## 2018-01-18 ENCOUNTER — Telehealth: Payer: Self-pay | Admitting: Gynecologic Oncology

## 2018-01-18 DIAGNOSIS — C762 Malignant neoplasm of abdomen: Secondary | ICD-10-CM

## 2018-01-18 DIAGNOSIS — C786 Secondary malignant neoplasm of retroperitoneum and peritoneum: Secondary | ICD-10-CM

## 2018-01-18 DIAGNOSIS — C5701 Malignant neoplasm of right fallopian tube: Secondary | ICD-10-CM

## 2018-01-18 DIAGNOSIS — C801 Malignant (primary) neoplasm, unspecified: Principal | ICD-10-CM

## 2018-01-18 NOTE — Telephone Encounter (Signed)
Discussed CT findings. Likely has carcinomatosis coating surface of small intestine but no large mass, and only tiny ascites. Will obtain PET to see if something more measurable as a target lesion which might make her eligible for clinical trial.  If not, would consider platinum (eg cis) possibly in a double (eg gem/cis).  Would also consider anti-estrogen therapy as maintenancy given ER positivity. Discussed anti-estrogens with chemotherapy as doublet often associated with high VTE risk.  Thereasa Solo, MD

## 2018-01-18 NOTE — Progress Notes (Signed)
Kathryn Ramsey has not been feeling well.  She came in for a Ca1 25 check and it was quite elevated at 1372.  (Prior was 48.1).  We just restage her with a CT of the abdomen and pelvis which shows only a little bit of ascitic fluid.  We are going to obtain a PET scan.  If we do find measurable disease she may qualify for study available currently at [facility] or possibly at [facility].  If there is no measurable disease very likely we will go with cisplatin and gemcitabine as suggested by Dr. Denman George.

## 2018-01-24 ENCOUNTER — Other Ambulatory Visit: Payer: 59

## 2018-01-24 ENCOUNTER — Ambulatory Visit (HOSPITAL_COMMUNITY): Admission: RE | Admit: 2018-01-24 | Payer: 59 | Source: Ambulatory Visit

## 2018-01-24 ENCOUNTER — Encounter (HOSPITAL_COMMUNITY)
Admission: RE | Admit: 2018-01-24 | Discharge: 2018-01-24 | Disposition: A | Discharge status: Home / Self Care | Payer: 59 | Source: Ambulatory Visit | Attending: Oncology | Admitting: Oncology

## 2018-01-24 DIAGNOSIS — C786 Secondary malignant neoplasm of retroperitoneum and peritoneum: Secondary | ICD-10-CM | POA: Diagnosis not present

## 2018-01-24 DIAGNOSIS — C801 Malignant (primary) neoplasm, unspecified: Secondary | ICD-10-CM | POA: Insufficient documentation

## 2018-01-24 DIAGNOSIS — C762 Malignant neoplasm of abdomen: Secondary | ICD-10-CM | POA: Diagnosis present

## 2018-01-24 DIAGNOSIS — C5701 Malignant neoplasm of right fallopian tube: Secondary | ICD-10-CM | POA: Insufficient documentation

## 2018-01-24 LAB — GLUCOSE, CAPILLARY: Glucose-Capillary: 88 mg/dL (ref 70–99)

## 2018-01-24 MED ORDER — FLUDEOXYGLUCOSE F - 18 (FDG) INJECTION
6.5800 | Freq: Once | INTRAVENOUS | Status: AC | PRN
  Administered 2018-01-24: 6.58 via INTRAVENOUS

## 2018-01-25 ENCOUNTER — Telehealth: Payer: Self-pay

## 2018-01-25 ENCOUNTER — Other Ambulatory Visit: Payer: Self-pay | Admitting: Oncology

## 2018-01-25 NOTE — Telephone Encounter (Signed)
Returned call to [facility] regarding call report.  Will forward for Dr. Virgie Dad review.

## 2018-01-26 ENCOUNTER — Other Ambulatory Visit: Payer: Self-pay | Admitting: Oncology

## 2018-01-26 ENCOUNTER — Encounter: Payer: Self-pay | Admitting: Oncology

## 2018-01-26 NOTE — Progress Notes (Unsigned)
[place]

## 2018-01-27 ENCOUNTER — Other Ambulatory Visit: Payer: Self-pay | Admitting: Oncology

## 2018-01-27 ENCOUNTER — Telehealth: Payer: Self-pay | Admitting: Oncology

## 2018-01-27 ENCOUNTER — Telehealth: Payer: Self-pay | Admitting: *Deleted

## 2018-01-27 NOTE — Telephone Encounter (Signed)
[place] about eligibility criteria for the vaccine trial per Dr. Denman George.  They recommended talking to the clinical research nurse at [phone].  Left a message requested a return call.

## 2018-01-27 NOTE — Progress Notes (Signed)
Dr. Alisia Ferrari clinic fax number is [phone]

## 2018-01-27 NOTE — Telephone Encounter (Signed)
This RN per MD request called to [facility] radiology - for CD of recent studies - to be mailed to :  [facility] [place] [address] Attention- Lisbeth Ply RN.  Recent dictation faxed with verified receipt to Dr Robina Ade at [phone]

## 2018-01-30 ENCOUNTER — Other Ambulatory Visit: Payer: Self-pay | Admitting: Oncology

## 2018-01-30 DIAGNOSIS — C801 Malignant (primary) neoplasm, unspecified: Principal | ICD-10-CM

## 2018-01-30 DIAGNOSIS — C5701 Malignant neoplasm of right fallopian tube: Secondary | ICD-10-CM

## 2018-01-30 DIAGNOSIS — C786 Secondary malignant neoplasm of retroperitoneum and peritoneum: Secondary | ICD-10-CM

## 2018-01-31 ENCOUNTER — Telehealth: Payer: Self-pay | Admitting: *Deleted

## 2018-01-31 ENCOUNTER — Other Ambulatory Visit: Payer: Self-pay | Admitting: *Deleted

## 2018-01-31 NOTE — Telephone Encounter (Signed)
This RN contacted [facility] for request for authorization for MRI of the pelvis.   Note [facility] dept could not enter via portal due to active CT of the abdomen under review and above " is a comparable study".  Per discussion with case intake manager clinical data given including pt's history of dx, surgery and chemotherapy.  Informed of recent complaint of bloating and abdominal pain with elevation of CA 125 from 58 to 1,300 resulting in need for STAT CT showing recurrent carcinomatosis. No measurable tumor identified.  PET scan obtained showing progression of carcinomatosis but again did not give measurable disease.  Plan is for treatment change but need scan showing measurable disease for appropriate treatment response.  Above data will be given to the medical team for review and further evaluation.  Informed status can now be checked on the [facility] portal.  This RN will inform our [facility] staff of above.

## 2018-02-01 ENCOUNTER — Other Ambulatory Visit: Payer: Self-pay | Admitting: Oncology

## 2018-02-02 ENCOUNTER — Telehealth: Payer: Self-pay | Admitting: Oncology

## 2018-02-02 NOTE — Telephone Encounter (Signed)
Scheduled appt per 12/18 sch message - pt is aware of appt

## 2018-02-03 ENCOUNTER — Telehealth: Payer: Self-pay | Admitting: *Deleted

## 2018-02-03 ENCOUNTER — Ambulatory Visit (HOSPITAL_COMMUNITY)
Admission: RE | Admit: 2018-02-03 | Discharge: 2018-02-03 | Disposition: A | Discharge status: Home / Self Care | Payer: 59 | Source: Ambulatory Visit | Attending: Oncology | Admitting: Oncology

## 2018-02-03 ENCOUNTER — Other Ambulatory Visit: Payer: Self-pay | Admitting: Oncology

## 2018-02-03 DIAGNOSIS — C5701 Malignant neoplasm of right fallopian tube: Secondary | ICD-10-CM

## 2018-02-03 DIAGNOSIS — C801 Malignant (primary) neoplasm, unspecified: Secondary | ICD-10-CM | POA: Insufficient documentation

## 2018-02-03 DIAGNOSIS — C786 Secondary malignant neoplasm of retroperitoneum and peritoneum: Secondary | ICD-10-CM | POA: Insufficient documentation

## 2018-02-03 MED ORDER — GADOBUTROL 1 MMOL/ML IV SOLN
6.0000 mL | Freq: Once | INTRAVENOUS | Status: AC | PRN
  Administered 2018-02-03: 6 mL via INTRAVENOUS

## 2018-02-03 NOTE — Telephone Encounter (Signed)
Per Peer to Peer by MD authorization obtained for MRI of the pelvis : [id number].  This note will be sent to [facility] for communication of above.

## 2018-02-03 NOTE — Progress Notes (Signed)
x

## 2018-02-06 ENCOUNTER — Other Ambulatory Visit: Payer: Self-pay | Admitting: Oncology

## 2018-02-06 NOTE — Progress Notes (Signed)
DISCONTINUE OFF PATHWAY REGIMEN - Ovarian   [id number]:Nab-Paclitaxel (Abraxane(R)) 100 mg/m2 D1,8,15 q28 days:   A cycle is every 28 days (3 weeks on and 1 week off):     Nab-paclitaxel (protein bound)   **Always confirm dose/schedule in your pharmacy ordering system**  REASON: Disease Progression PRIOR TREATMENT: Off Pathway: Nab-Paclitaxel (Abraxane(R)) 100 mg/m2 D1,8,15 q28 days TREATMENT RESPONSE: Progressive Disease (PD)  START OFF PATHWAY REGIMEN - Ovarian   [id number]:Cisplatin 25 mg/m2 D1,8 + Gemcitabine 1,000 mg/m2 D1,8 q21 Days:   A cycle is every 21 days:     Gemcitabine      Cisplatin   **Always confirm dose/schedule in your pharmacy ordering system**  Patient Characteristics: Recurrent or Progressive Disease, Third Line Therapy, Platinum Sensitive and >  6 Months Since Last Platinum Therapy, BRCA Mutation Absent/Unknown Therapeutic Status: Recurrent or Progressive Disease BRCA Mutation Status: Absent Line of Therapy: Third Line  Intent of Therapy: Non-Curative / Palliative Intent, Discussed with Patient

## 2018-02-13 ENCOUNTER — Other Ambulatory Visit: Payer: Self-pay | Admitting: Oncology

## 2018-02-14 NOTE — Progress Notes (Signed)
[facility]  [phone] Fax:(336) [phone]     ID: Kathryn Ramsey DOB: Nov 07, 1965  MR#: [id number]  CBS#:[id number]  Patient Care Team: Harlan Stains, MD as PCP - General (Family Medicine) [name], MD as Consulting Physician (Obstetrics and Gynecology) Juanita Craver, MD as Consulting Physician (Gastroenterology) Servando Salina, MD as Consulting Physician (Obstetrics and Gynecology) Nickie Retort, MD as Consulting Physician (Urology) Gillis Ends, MD as Referring Physician (Obstetrics and Gynecology) OTHER MD: Festus Aloe [phone])   CHIEF COMPLAINT:  ovarian cancer   CURRENT TREATMENT: Cisplatin, gemcitabine   INTERVAL HISTORY: Kathryn Ramsey returns today for follow-up of her recurrent ovarian cancer. She is accompanied by her husband.  The patient continues under observation.   Since her last visit here on 11/30/2017, she underwent a skull base to thigh PET scan on 01/24/2018 showing Rim of metabolic activity associated with the fluid collection within the deep pelvis consistent with malignant ascites / peritoneal metastasis. Additionally rim of metabolic activity over liver capsule and inferior to the spleen also consistent with peritoneal metastasis. Overall disease is minimal but increasing from recent CT scans. No evidence of solid organ metastasis.  No skeletal metastasis.  She also underwent a pelvis MRI with and without contrast on 02/03/2018 showing: A small volume fluid in the central pelvis and cul-de-sac. The peritoneal lining of the posterior cul-de-sac shows thin, smooth enhancement throughout but no focal thickening or nodularity. Similar very subtle, thin peritoneal enhancement is seen along the mid right pelvic sidewall and anteriorly along the left pelvic sidewall. No discernible enhancing mesenteric nodule or mass in the pelvis.  Results for TINITA, BROOKER (MRN [id number]) as of 02/17/2018 08:30  Ref. Range  09/12/2017 11:25 10/10/2017 11:12 11/07/2017 08:50 11/28/2017 08:32 01/16/2018 12:40  Cancer Antigen (CA) 125 Latest Ref Range: 0.0 - 38.1 U/mL 48.9 (H) 39.5 (H) 38.9 (H) 48.1 (H) 1,372.0 (H)    REVIEW OF SYSTEMS: For the [place], Kathryn Ramsey spent a lot of time with family; she celebrated New Years at [employer]. Sabree states that she feels like her abdomen is full of fluid. She states that her weight is 10 lbs up from her normal and she didn't eat enough to warrant that amount of weight gain. She still has a good appetite, however. Her bowel movements are still normal. She has less urgency to urinate, but she has not had a accident. She has some sharp pains in her abdomen. The patient denies unusual headaches, visual changes, nausea, vomiting, or dizziness. There has been no unusual cough, phlegm production, or pleurisy. This been no change in bowel habits. The patient denies unexplained fatigue, bleeding, rash, or fever. A detailed review of systems was otherwise noncontributory.    HISTORY OF PRESENT ILLNESS: From Dr. Serita Grit original intake note 03/17/2015:  "[name] is a very pleasant G4P4 who is seen in consultation at the request of Dr Collene Mares and Dr Garwin Brothers for peritoneal carcinomatosis. The patient has a history of a workup by urology for gross hematuria which included a pelvic examine was suggested for extrinsic mass effect on the bladder on cystoscopy. Cystoscopy took place on in December 2016. The patient denies abdominal pain bloating early satiety or abdominal distention.  On 08/03/2015 at [facility] she underwent a CT scan of the abdomen and pelvis as ordered by Dr Baruch Gouty. This revealed a 1.4 cm low attenuation lesion on the posterior right hepatic lobe suggestive of a hemangioma. Status post hysterectomy. No ovarian masses. Moderate ascites. Omental caking seen in  the lateral left abdomen and pelvis. Peritoneal nodularity in the left lateral pelvic cul-de-sac. No gross extrinsic  compression on the bladder was identified on imaging.  The patient was then seen by her gastroenterologist, Dr. Collene Mares, who performed a colonoscopy which was unremarkable.  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 She was treated adjuvantly with carboplatin and paclitaxel, as detailed below. Her CA 125 dropped from 1226 on 03/20/2015 to 26.1 by 06/16/2015.  Her subsequent history is as detailed below.  I PAST MEDICAL HISTORY: Past Medical History:  Diagnosis Date  . Acute sensory neuropathy 06/26/2015  . Allergy   . Anemia   . Asthma    illness induced asthma  . Blood transfusion without reported diagnosis    at age 52 years old D/T surgery to femur being crushed  . 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 Heart murmur    pt, states had a "working heart murmur"  . History of kidney stones   . PONV (postoperative nausea and vomiting)   . Skin cancer     PAST SURGICAL HISTORY: Past Surgical History:  Procedure Laterality Date  . 3 laporscopic proceedures    . ABDOMINAL HYSTERECTOMY     2010  . CHOLECYSTECTOMY     2010  . DILATION AND CURETTAGE OF UTERUS     1997  . LAPAROSCOPY N/A 03/27/2015   Procedure: DIAGNOSTIC LAPAROSCOPY ;  Surgeon: [name], MD;  Location: [facility];  Service: Gynecology;  Laterality: N/A;  . LAPAROSCOPY N/A 02/24/2016   Procedure: LAPAROSCOPY DIAGNOSTIC WITH PERITONEAL WASHINGS AND PERITONEAL BIOPSY;  Surgeon: [name], MD;  Location: [facility];  Service: Gynecology;  Laterality: N/A;  . LAPAROTOMY N/A 03/27/2015   Procedure: EXPLORATORY LAPAROTOMY,  BILATERAL SALPINGO OOPHORECTOMY, OMENTECTOMY, RADICAL TUMOR DEBULKING;  Surgeon: [name], MD;  Location: [facility];  Service: Gynecology;  Laterality: N/A;  . sinus surgery 1994      FAMILY HISTORY Family History  Problem Relation Age of Onset  . Hypertension Father   . Skin cancer Father        nonmelanoma skin cancers in his late 5s  . Non-Hodgkin's lymphoma Maternal Aunt        dx. 42s; smoker  . Stroke Maternal Grandmother   . Other Mother        benign meningioma dx. early-mid-70s; hysterectomy in her late 43s for heavy periods - still has ovaries  . Other Son        one son with pre-cancerous skin findings  . Other Daughter        cysts on ovaries and hx of heavy periods  . Other Sister        hysterectomy for cysts - still has ovaries  . Heart attack Maternal Grandfather   . Heart attack Paternal Grandfather   . Renal cancer Maternal Aunt 60       smoker  . Breast cancer Maternal Aunt 78  . Other Maternal Aunt        dx. benign brain tumor (meningioma) at 58; 2nd benign brain tumor in her late 54s; hx of radical hysterectomy at  age 79  . Brain cancer Other        NOS tumor  The patient's parents are both living, in their late 74s as of January 2018. The patient has one brother, 3 sisters. There is no history of ovarian cancer in the family. One maternal aunt had breast and kidney cancer, another had non-Hodgkin's lymphoma.  GYNECOLOGIC HISTORY:  No LMP recorded. Patient has had a hysterectomy. 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 Her husband [name] is a Engineer, maintenance (IT). Their children are 12, 85, 99 and 50 y/o as of JAN 2018. The oldest works as an Electrical engineer in the [employer] in [place]. The other 3 children live in [place], the youngest currently attending [employer]. The patient has no grandchildren. She attends [employer]    ADVANCED DIRECTIVES:     HEALTH MAINTENANCE: Social History   Tobacco Use  . Smoking status: Never Smoker  . Smokeless tobacco: Never Used  Substance Use Topics  . Alcohol use: Yes    Comment:  3 glasses wine or beer/week  . Drug use: No     Colonoscopy:2016  PAP: s/p hyst  Bone density:   Allergies  Allergen Reactions  . Bee Venom Shortness Of Breath    swelling  . Compazine [Prochlorperazine Edisylate] Anaphylaxis, Anxiety and Palpitations  . Sulfa Antibiotics Shortness Of Breath    Vomit , diarrhea , hives    Current Outpatient Medications  Medication Sig Dispense Refill  . dexamethasone (DECADRON) 4 MG tablet Take 2 tablets by mouth once a day on the day after cisplatin chemotherapy and then take 2 tablets two times a day for 2 days. Take with food. 30 tablet 1  . diphenhydrAMINE (BENADRYL) 50 MG capsule 50 mg Diphenhydramine (Benadryl) PO within 1 hour of the injection 1 capsule 0  . fluticasone (FLONASE) 50 MCG/ACT nasal spray Place 1 spray into both nostrils 2 (two) times daily. (Patient taking differently: Place 1 spray into both nostrils as needed. ) 16 g 2  . gabapentin (NEURONTIN) 100 MG capsule Take 1 capsule (100 mg total) by mouth 3 (three) times daily. 270 capsule 3  . loratadine (CLARITIN) 10 MG tablet Take 10 mg by mouth as needed.     . ondansetron (ZOFRAN) 8 MG tablet Take 1 tablet (8 mg total) by mouth 2 (two) times daily as needed. Start on the third day after cisplatin chemotherapy. 30 tablet 1  . prochlorperazine (COMPAZINE) 10 MG tablet Take 1 tablet (10 mg total) by mouth every 6 (six) hours as needed (Nausea or vomiting). 30 tablet 1   No current facility-administered medications for this visit.     OBJECTIVE: Middle-aged white woman who appears stated age  55:   02/17/18 0810  BP: 129/81  Pulse: 61  Resp: 18  Temp: 98.3 F (36.8 C)  SpO2: 100%     Body mass index is 23.03 kg/m.   ECOG FS:1 - Symptomatic but completely ambulatory  Sclerae unicteric,  pupils round and equal Oropharynx clear and moist No cervical or supraclavicular adenopathy Lungs no rales or rhonchi Heart regular rate and rhythm Abd moderately terse, nontender, positive bowel sounds, port site metastases not palpable MSK no focal spinal tenderness, no lymphedema Neuro: nonfocal, well oriented, appropriate affect Breasts: Deferred  LAB RESULTS:  CMP     Component Value Date/Time   NA 140 01/16/2018 1240   NA 140  02/11/2017 0755   K 4.4 01/16/2018 1240   K 4.4 02/11/2017 0755   CL 104 01/16/2018 1240   CO2 27 01/16/2018 1240   CO2 26 02/11/2017 0755   GLUCOSE 95 01/16/2018 1240   GLUCOSE 93 02/11/2017 0755   BUN 9 01/16/2018 1240   BUN 7.9 02/11/2017 0755   CREATININE 0.79 01/16/2018 1240   CREATININE 0.7 02/11/2017 0755   CALCIUM 9.6 01/16/2018 1240   CALCIUM 9.6 02/11/2017 0755   PROT 7.8 01/16/2018 1240   PROT 7.5 02/11/2017 0755   ALBUMIN 4.5 01/16/2018 1240   ALBUMIN 4.4 02/11/2017 0755   AST 25 01/16/2018 1240   AST 27 02/11/2017 0755   ALT 19 01/16/2018 1240   ALT 24 02/11/2017 0755   ALKPHOS 60 01/16/2018 1240   ALKPHOS 58 02/11/2017 0755   BILITOT 0.5 01/16/2018 1240   BILITOT 0.43 02/11/2017 0755   GFRNONAA >60 01/16/2018 1240   GFRAA >60 01/16/2018 1240    INo results found for: SPEP, UPEP  Lab Results  Component Value Date   WBC 3.9 (L) 01/16/2018   NEUTROABS 2.3 01/16/2018   HGB 13.1 01/16/2018   HCT 40.5 01/16/2018   MCV 98.5 01/16/2018   PLT 217 01/16/2018      Chemistry      Component Value Date/Time   NA 140 01/16/2018 1240   NA 140 02/11/2017 0755   K 4.4 01/16/2018 1240   K 4.4 02/11/2017 0755   CL 104 01/16/2018 1240   CO2 27 01/16/2018 1240   CO2 26 02/11/2017 0755   BUN 9 01/16/2018 1240   BUN 7.9 02/11/2017 0755   CREATININE 0.79 01/16/2018 1240   CREATININE 0.7 02/11/2017 0755      Component Value Date/Time   CALCIUM 9.6 01/16/2018 1240   CALCIUM 9.6 02/11/2017 0755   ALKPHOS 60 01/16/2018 1240    ALKPHOS 58 02/11/2017 0755   AST 25 01/16/2018 1240   AST 27 02/11/2017 0755   ALT 19 01/16/2018 1240   ALT 24 02/11/2017 0755   BILITOT 0.5 01/16/2018 1240   BILITOT 0.43 02/11/2017 0755       No results found for: LABCA2  No components found for: LABCA125  No results for input(s): INR in the last 168 hours.  Urinalysis    Component Value Date/Time   COLORURINE STRAW (A) 11/22/2017 0940   APPEARANCEUR CLEAR 11/22/2017 0940   LABSPEC 1.002 (L) 11/22/2017 0940   LABSPEC 1.005 10/09/2015 1239   PHURINE 7.0 11/22/2017 0940   GLUCOSEU NEGATIVE 11/22/2017 0940   GLUCOSEU Negative 10/09/2015 1239   HGBUR NEGATIVE 11/22/2017 0940   BILIRUBINUR NEGATIVE 11/22/2017 0940   BILIRUBINUR Negative 10/09/2015 1239   KETONESUR NEGATIVE 11/22/2017 0940   PROTEINUR NEGATIVE 11/22/2017 0940   UROBILINOGEN 0.2 10/09/2015 1239   NITRITE NEGATIVE 11/22/2017 0940   LEUKOCYTESUR NEGATIVE 11/22/2017 0940   LEUKOCYTESUR Negative 10/09/2015 1239    STUDIES: Mr Pelvis W Wo Contrast  Result Date: 02/03/2018 CLINICAL DATA:  Ovarian cancer. Patient with peritoneal enhancement in the pelvis on CT and a rim of hypermetabolism along the peritoneal lining of the pelvis on recent PET-CT. PET-CT also rim like activity over the liver capsule and inferior spleen. EXAM: MRI PELVIS WITHOUT AND WITH CONTRAST TECHNIQUE: Multiplanar multisequence MR imaging of the pelvis was performed both before and after administration of intravenous contrast. CONTRAST:  7 cc Gadavist COMPARISON:  PET-CT 01/24/2018.  CT abdomen and pelvis 01/17/2018. FINDINGS: Urinary Tract: Bladder is nondistended. Urethra is normal in appearance. Bowel:  No evidence for bowel dilatation in the pelvis. Vascular/Lymphatic: Common iliac artery and vein patent bilaterally. No evidence for pelvic sidewall lymphadenopathy. Reproductive:  Uterus surgically absent.  There is no adnexal mass. Other: Small volume free fluid is identified in the central and  posterior pelvis. Fluid is homogeneous and T1 hypointense. T2 imaging shows a fluid fluid layer (image 11/sagittal series 10) although the fluid in each layer is homogeneous. Postcontrast imaging shows diffuse, thin, smooth enhancement involving the posterior peritoneum of the cul-de-sac (well demonstrated on axial image 49 of postcontrast T1 series 902). There is some associated peritoneal enhancement along the right pelvic sidewall as well. Minimal enhancement noted along the anterior left pelvic sidewall. The peritoneal enhancement is thin and smooth throughout with no measurable nodule or thickening evident. No mesenteric nodule or abnormal enhancing tissue is evident. Musculoskeletal: No abnormal marrow enhancement within the visualized bony anatomy. IMPRESSION: 1. As noted on prior imaging, the patient has small volume fluid in the central pelvis and cul-de-sac. The peritoneal lining of the posterior cul-de-sac shows thin, smooth enhancement throughout but no focal thickening or nodularity. Similar very subtle, thin peritoneal enhancement is seen along the mid right pelvic sidewall and anteriorly along the left pelvic sidewall. 2. No discernible enhancing mesenteric nodule or mass in the pelvis. Electronically Signed   By: Misty Stanley M.D.   On: 02/03/2018 18:32   Nm Pet Image Restag (ps) Skull Base To Thigh  Result Date: 01/25/2018 CLINICAL DATA:  Initial treatment strategy for germ-cell tumor. Ovarian cancer. Rising CA 125. EXAM: NUCLEAR MEDICINE PET SKULL BASE TO THIGH TECHNIQUE: 6.58 mCi F-18 FDG was injected intravenously. Full-ring PET imaging was performed from the skull base to thigh after the radiotracer. CT data was obtained and used for attenuation correction and anatomic localization. Fasting blood glucose: 88 mg/dl COMPARISON:  PET-CT 01/26/2016, abdominal MRI 06/02/2017, CT 11/28/2017 FINDINGS: Mediastinal blood pool activity: SUV max 1.78 NECK: No hypermetabolic lymph nodes in the neck.  Incidental CT findings: none CHEST: No hypermetabolic mediastinal or hilar nodes. No suspicious pulmonary nodules on the CT scan. Incidental CT findings: none ABDOMEN/PELVIS: There is metabolic activity associated with the small volume of intraperitoneal free fluid in the deep RIGHT pelvis. There is subtle nodular thickening along the peritoneal surface which has localized metabolic activity (SUV max equal 4.8). The volume of fluid is increased from recent CT scan There is a rim of metabolic activity along the anterior margin liver and just deep to the spleen along the peritoneal surface. There is small amount fluid in these regions with no specific nodularity on this noncontrast exam. The activity along the liver margin is moderate with SUV max equal 4.2. Similar activity inferior to the spleen with SUV max equal 4.1 (images 98 and 101 of the fused data set respectively). No additional evidence metastatic disease in the abdomen pelvis. No hypermetabolic lymph nodes. No sidewall abnormality. Incidental CT findings: Post hysterectomy and cholecystectomy. SKELETON: No focal hypermetabolic activity to suggest skeletal metastasis. Incidental CT findings: none IMPRESSION: 1. Rim of metabolic activity associated with the fluid collection within the deep pelvis consistent with MALIGNANT ASCITES / PERITONEAL METASTASIS. Additionally rim of metabolic activity over liver capsule and inferior to the spleen also consistent with peritoneal metastasis. Overall disease is minimal but increasing from recent CT scans. 2. No evidence of solid organ metastasis.  No skeletal metastasis. These results will be called to the ordering clinician or representative by the Radiologist Assistant, and communication documented in the PACS or zVision  Dashboard. Electronically Signed   By: Suzy Bouchard M.D.   On: 01/25/2018 09:25     ELIGIBLE FOR AVAILABLE RESEARCH PROTOCOL:  discussing protocols at [facility] [place]: 52  y.o. BRCA negative [place] woman status post radical tumor debulking with optimal cytoreduction (R0) 03/27/2015 for a stage IIIC, high-grade right fallopian tube carcinoma  (a) baseline CA-125 was 1226.  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)  (c) tumor is PD-L1 negative (02/24/2016)  (d) tumor is strongly estrogen receptor positive, progesterone receptor negative (02/24/2016)  (1) adjuvant chemotherapy consisted of carboplatin and paclitaxel for 6 doses, begun 04/14/2015, completed 08/11/2015  (a) paclitaxel was omitted from cycle 5 and dose reduced on cycle 6 because of neuropathy   (b) a "make up" dose of paclitaxel was given 08/25/2015  (c) last carboplatin dose was 08/11/2015  (d) CA-125 normalized by 06/16/2015   (2) FIRST RECURRENCE: January 2018  (a) CA-125 rise beginning November 2017 led to CT scans and PET scans December 2017, all negative  (b) exploratory laparotomy 02/24/2016 showed pathologically confirmed recurrence, with miliary disease involving all examined surfaces  (c) port-site metastases noted 03/08/2016  (d) the January 2018 tumor sample was tested for the estrogen receptor and he was 80 percent positive with strong staining, progesterone receptor negative  (3) carboplatin/liposomal doxorubicin started 03/05/2016, repeated every three weeks x4, completed 06/04/2016.  (a) cycle 3 delayed one week because of neutropenia; OnPro added  (b) CA 125 normalized after cycle 3  (4) RUCAPARIB maintenance started 07/02/2016  (a) restaging 10/01/2016: Normal CA 125, negative CT of the abdomen and pelvis  (b) labs 11/24/2016 shows a rise in her CA 125-43.4, with continuing rise thereafter  (c) rucaparib discontinued October 2018 with rising CA 125  (5) anastrozole started December 23, 2016-stopped after a  couple of weeks with poor tolerance  SECOND RECURRENCE: (6) Gemcitabine/Bevacizumab started on 02/04/2017  (a) gemcitabine omitted cycle 4 because of intercurrent infection  (b) gemcitabine resumed with cycle 5, with intervening rise in  Ca 125  (c) repeat CT scan of the abdomen and pelvis on 05/26/2017 does not confirm obvious disease progression  (d) cycle 5 of gemcitabine/bevacizumab delayed 1 week   (e) gemcitabine/ bevacizumab discontinued after 6 cycles, with rising CA 125 (last dose 06/24/2017)  (7) foundation one testing did not show a high mutation burden and the microsatellite status was stable.  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  These were not immediately actionable  (8) Abraxane started 07/12/2017, given weekly or weeks 1 and 2 of each 3 weeks cycle, last dose 11/15/2017 (5 cycles)  (a) cycle 2 started 08/09/2017, will be day 1 day 8 only  (b) cycle 3 scheduled to start 09/13/2017, day 1 day 8 at patient request  (c) CT of the abdomen and pelvis 11/28/2017 shows no measurable disease  (d) PET scan 01/25/2018 and MRI of the pelvis 02/03/2018 show no measurable disease  (e) patient has symptomatic ascites and a rapidly rising Ca1 25  (9) to start cis-platinum/gemcitabine days 1 and 8 of each 21-day cycle on 02/18/2018   PLAN: Kirsti remains very interested in participating in a study although she understands the majority of phase 1 studies do not result in significant responses.  Nevertheless she has no measurable disease that we can detect and therefore she needs to choose at this point  between observation and treatment.  Unfortunately she is symptomatic.  Her Ca 125 is rising very rapidly.  She is concerned that if we do not intervene now she may be in significant trouble given the multiple possible complications of untreated carcinomatosis.  We discussed specifically the combination of cis-platinum and etoposide.  She is aware of the multiple side effects  particularly of platinum in this setting.  They include hair loss, possible damage to her hearing, kidneys, or peripheral nerves, very rare cases of thrombotic microangiopathy, hypomagnesemia and hypokalemia questions, as well as of course severe nausea and vomiting and low blood counts.  We will of course hydrate her appropriately before treatment.  I have written for her to receive dexamethasone twice daily on days 2 and 3, with Compazine as needed days to 3 and 4 and ondansetron beginning on day 3.  I have urged her to take these medications as prescribed, since otherwise she may have intractable nausea and vomiting problems requiring admission.  I have urged her to drink 2 to 3 quarts a day and she feels she will not have a problem with that.  She will also start magnesium supplementation.  We are trying to obtain approval from her insurance and if so she is scheduled for treatment tomorrow.  She is having lab work today.  She is scheduled to see me on 02/23/2018.  Her next treatment may be oh 1/10 or oh 1/11 depending on availability.  Tentatively I am scheduling her for paracentesis on 02/22/2018.  She may cancel that if she finds her abdominal issues symptomatically improved after her first dose of chemotherapy.  She knows to call for any other issues that may develop before the next visit. Magrinat, Virgie Dad, MD  02/17/18 9:01 AM Medical Oncology and Hematology [facility] [address] [place], [place] [address] Tel. [phone]    Fax. [phone]   I, Jacqualyn Posey am acting as a Education administrator for Chauncey Cruel, MD.   I, Lurline Del MD, have reviewed the above documentation for accuracy and completeness, and I agree with the above.

## 2018-02-17 ENCOUNTER — Telehealth: Payer: Self-pay | Admitting: Oncology

## 2018-02-17 ENCOUNTER — Inpatient Hospital Stay: Payer: 59

## 2018-02-17 ENCOUNTER — Inpatient Hospital Stay: Payer: 59 | Attending: Oncology | Admitting: Oncology

## 2018-02-17 VITALS — BP 129/81 | HR 61 | Temp 98.3°F | Resp 18 | Ht 63.0 in | Wt 130.0 lb

## 2018-02-17 DIAGNOSIS — C5701 Malignant neoplasm of right fallopian tube: Secondary | ICD-10-CM | POA: Insufficient documentation

## 2018-02-17 DIAGNOSIS — G893 Neoplasm related pain (acute) (chronic): Secondary | ICD-10-CM | POA: Diagnosis not present

## 2018-02-17 DIAGNOSIS — C786 Secondary malignant neoplasm of retroperitoneum and peritoneum: Secondary | ICD-10-CM

## 2018-02-17 DIAGNOSIS — Z79899 Other long term (current) drug therapy: Secondary | ICD-10-CM | POA: Diagnosis not present

## 2018-02-17 DIAGNOSIS — C762 Malignant neoplasm of abdomen: Secondary | ICD-10-CM

## 2018-02-17 DIAGNOSIS — Z5189 Encounter for other specified aftercare: Secondary | ICD-10-CM | POA: Insufficient documentation

## 2018-02-17 DIAGNOSIS — C801 Malignant (primary) neoplasm, unspecified: Secondary | ICD-10-CM

## 2018-02-17 DIAGNOSIS — D701 Agranulocytosis secondary to cancer chemotherapy: Secondary | ICD-10-CM | POA: Insufficient documentation

## 2018-02-17 DIAGNOSIS — Z7189 Other specified counseling: Secondary | ICD-10-CM

## 2018-02-17 DIAGNOSIS — Z5111 Encounter for antineoplastic chemotherapy: Secondary | ICD-10-CM | POA: Insufficient documentation

## 2018-02-17 DIAGNOSIS — K5900 Constipation, unspecified: Secondary | ICD-10-CM | POA: Insufficient documentation

## 2018-02-17 DIAGNOSIS — Z85828 Personal history of other malignant neoplasm of skin: Secondary | ICD-10-CM | POA: Diagnosis not present

## 2018-02-17 LAB — CBC WITH DIFFERENTIAL/PLATELET
Abs Immature Granulocytes: 0.01 10*3/uL (ref 0.00–0.07)
Basophils Absolute: 0 10*3/uL (ref 0.0–0.1)
Basophils Relative: 0 %
Eosinophils Absolute: 0 10*3/uL (ref 0.0–0.5)
Eosinophils Relative: 0 %
HCT: 39.9 % (ref 36.0–46.0)
Hemoglobin: 12.8 g/dL (ref 12.0–15.0)
Immature Granulocytes: 0 %
Lymphocytes Relative: 25 %
Lymphs Abs: 1.2 10*3/uL (ref 0.7–4.0)
MCH: 31 pg (ref 26.0–34.0)
MCHC: 32.1 g/dL (ref 30.0–36.0)
MCV: 96.6 fL (ref 80.0–100.0)
Monocytes Absolute: 0.4 10*3/uL (ref 0.1–1.0)
Monocytes Relative: 7 %
Neutro Abs: 3.1 10*3/uL (ref 1.7–7.7)
Neutrophils Relative %: 68 %
Platelets: 305 10*3/uL (ref 150–400)
RBC: 4.13 MIL/uL (ref 3.87–5.11)
RDW: 12.2 % (ref 11.5–15.5)
WBC: 4.7 10*3/uL (ref 4.0–10.5)
nRBC: 0 % (ref 0.0–0.2)

## 2018-02-17 LAB — COMPREHENSIVE METABOLIC PANEL
ALT: 32 U/L (ref 0–44)
AST: 36 U/L (ref 15–41)
Albumin: 3.9 g/dL (ref 3.5–5.0)
Alkaline Phosphatase: 67 U/L (ref 38–126)
Anion gap: 9 (ref 5–15)
BUN: 6 mg/dL (ref 6–20)
CO2: 28 mmol/L (ref 22–32)
Calcium: 9.6 mg/dL (ref 8.9–10.3)
Chloride: 104 mmol/L (ref 98–111)
Creatinine, Ser: 0.68 mg/dL (ref 0.44–1.00)
GFR calc Af Amer: >60 mL/min (ref >60)
GFR calc non Af Amer: >60 mL/min (ref >60)
Glucose, Bld: 96 mg/dL (ref 70–99)
Potassium: 4.9 mmol/L (ref 3.5–5.1)
Sodium: 141 mmol/L (ref 135–145)
Total Bilirubin: 0.4 mg/dL (ref 0.3–1.2)
Total Protein: 7.2 g/dL (ref 6.5–8.1)

## 2018-02-17 MED ORDER — ONDANSETRON HCL 8 MG PO TABS: 8.0000 mg | ORAL_TABLET | Freq: Two times a day (BID) | ORAL | 1 refills | Status: DC | PRN

## 2018-02-17 MED ORDER — PROCHLORPERAZINE MALEATE 10 MG PO TABS: 10.0000 mg | ORAL_TABLET | Freq: Four times a day (QID) | ORAL | 1 refills | Status: DC | PRN

## 2018-02-17 MED ORDER — MAGNESIUM OXIDE 400 (241.3 MG) MG PO TABS: 400.0000 mg | ORAL_TABLET | Freq: Every day | ORAL | 3 refills | Status: DC

## 2018-02-17 MED ORDER — DEXAMETHASONE 4 MG PO TABS: ORAL_TABLET | ORAL | 1 refills | Status: DC

## 2018-02-17 NOTE — Telephone Encounter (Signed)
Gave avs and calendar waiting for approval for 1/10

## 2018-02-17 NOTE — Telephone Encounter (Signed)
Called regarding 1/10

## 2018-02-18 ENCOUNTER — Inpatient Hospital Stay: Payer: 59

## 2018-02-18 VITALS — BP 131/83 | HR 71 | Temp 98.2°F | Resp 16

## 2018-02-18 DIAGNOSIS — C762 Malignant neoplasm of abdomen: Secondary | ICD-10-CM

## 2018-02-18 DIAGNOSIS — Z7189 Other specified counseling: Secondary | ICD-10-CM

## 2018-02-18 DIAGNOSIS — C786 Secondary malignant neoplasm of retroperitoneum and peritoneum: Secondary | ICD-10-CM

## 2018-02-18 DIAGNOSIS — C5701 Malignant neoplasm of right fallopian tube: Secondary | ICD-10-CM

## 2018-02-18 DIAGNOSIS — C801 Malignant (primary) neoplasm, unspecified: Principal | ICD-10-CM

## 2018-02-18 DIAGNOSIS — Z5189 Encounter for other specified aftercare: Secondary | ICD-10-CM | POA: Diagnosis not present

## 2018-02-18 LAB — CA 125: Cancer Antigen (CA) 125: 4173 U/mL — ABNORMAL HIGH (ref 0.0–38.1)

## 2018-02-18 MED ORDER — PALONOSETRON HCL INJECTION 0.25 MG/5ML
0.2500 mg | Freq: Once | INTRAVENOUS | Status: AC
  Administered 2018-02-18: 0.25 mg via INTRAVENOUS

## 2018-02-18 MED ORDER — SODIUM CHLORIDE 0.9 % IV SOLN
Freq: Once | INTRAVENOUS | Status: AC
  Administered 2018-02-18: 12:00:00 via INTRAVENOUS
  Filled 2018-02-18: qty 250

## 2018-02-18 MED ORDER — SODIUM CHLORIDE 0.9 % IV SOLN
800.0000 mg/m2 | Freq: Once | INTRAVENOUS | Status: AC
  Administered 2018-02-18: 1254 mg via INTRAVENOUS
  Filled 2018-02-18: qty 32.98

## 2018-02-18 MED ORDER — POTASSIUM CHLORIDE 2 MEQ/ML IV SOLN
Freq: Once | INTRAVENOUS | Status: AC
  Administered 2018-02-18: 10:00:00 via INTRAVENOUS
  Filled 2018-02-18: qty 10

## 2018-02-18 MED ORDER — SODIUM CHLORIDE 0.9 % IV SOLN
750.0000 mL | Freq: Once | INTRAVENOUS | Status: DC
  Filled 2018-02-18: qty 750

## 2018-02-18 MED ORDER — SODIUM CHLORIDE 0.9 % IV SOLN
40.0000 mg/m2 | Freq: Once | INTRAVENOUS | Status: AC
  Administered 2018-02-18: 63 mg via INTRAVENOUS
  Filled 2018-02-18: qty 63

## 2018-02-18 MED ORDER — SODIUM CHLORIDE 0.9 % IV SOLN
Freq: Once | INTRAVENOUS | Status: DC
  Filled 2018-02-18: qty 250

## 2018-02-18 MED ORDER — SODIUM CHLORIDE 0.9 % IV SOLN
Freq: Once | INTRAVENOUS | Status: AC
  Administered 2018-02-18: 11:00:00 via INTRAVENOUS
  Filled 2018-02-18: qty 5

## 2018-02-18 MED ORDER — PALONOSETRON HCL INJECTION 0.25 MG/5ML
INTRAVENOUS | Status: AC
  Filled 2018-02-18: qty 5

## 2018-02-18 NOTE — Progress Notes (Signed)
Okay to run hydration fluids with cisplatin today.UOP over 1200 thus far..   First time Cisplatin treatment. Pt. Tolerated with out issues.

## 2018-02-18 NOTE — Patient Instructions (Signed)
[facility] Discharge Instructions for Patients Receiving Chemotherapy  Today you received the following chemotherapy agents Gemzar, Cisplatin. To help prevent nausea and vomiting after your treatment, we encourage you to take your nausea medication. DO NOT TAKE ZOFRAN FOR THREE DAYS AFTER TREATMENT.   If you develop nausea and vomiting that is not controlled by your nausea medication, call the clinic.   BELOW ARE SYMPTOMS THAT SHOULD BE REPORTED IMMEDIATELY:  *FEVER GREATER THAN 100.5 F  *CHILLS WITH OR WITHOUT FEVER  NAUSEA AND VOMITING THAT IS NOT CONTROLLED WITH YOUR NAUSEA MEDICATION  *UNUSUAL SHORTNESS OF BREATH  *UNUSUAL BRUISING OR BLEEDING  TENDERNESS IN MOUTH AND THROAT WITH OR WITHOUT PRESENCE OF ULCERS  *URINARY PROBLEMS  *BOWEL PROBLEMS  UNUSUAL RASH Items with * indicate a potential emergency and should be followed up as soon as possible.  Feel free to call the clinic should you have any questions or concerns. The clinic phone number is [phone].  Please show the [place] at check-in to the Emergency Department and triage nurse.

## 2018-02-20 ENCOUNTER — Ambulatory Visit (HOSPITAL_COMMUNITY)
Admission: RE | Admit: 2018-02-20 | Discharge: 2018-02-20 | Disposition: A | Discharge status: Home / Self Care | Payer: 59 | Source: Ambulatory Visit | Attending: Oncology | Admitting: Oncology

## 2018-02-20 ENCOUNTER — Other Ambulatory Visit: Payer: Self-pay | Admitting: Oncology

## 2018-02-20 ENCOUNTER — Other Ambulatory Visit: Payer: 59

## 2018-02-20 ENCOUNTER — Encounter: Payer: Self-pay | Admitting: Oncology

## 2018-02-20 ENCOUNTER — Other Ambulatory Visit: Payer: Self-pay | Admitting: *Deleted

## 2018-02-20 DIAGNOSIS — C762 Malignant neoplasm of abdomen: Secondary | ICD-10-CM

## 2018-02-20 DIAGNOSIS — R188 Other ascites: Secondary | ICD-10-CM | POA: Diagnosis not present

## 2018-02-20 DIAGNOSIS — C569 Malignant neoplasm of unspecified ovary: Secondary | ICD-10-CM | POA: Diagnosis not present

## 2018-02-20 MED ORDER — LIDOCAINE HCL 1 % IJ SOLN
INTRAMUSCULAR | Status: AC
  Filled 2018-02-20: qty 20

## 2018-02-20 NOTE — Progress Notes (Signed)
Working on the paracentesis now - and then will get the order and work on the port- I will call you asap- Val

## 2018-02-20 NOTE — Progress Notes (Signed)
Patient presented today for image guided paracentesis due to abdominal distention. Patient followed by Dr. Jana Hakim for ovarian cancer.   Limited abdominal ultrasound performed today which shows trace amount of fluid in right lower and right upper quadrants that is not amenable to percutaneous drainage today. Images were reviewed with patient who states understanding and agrees to forego procedure today.   Please call IR with questions or concerns.  Candiss Norse, PA-C

## 2018-02-21 ENCOUNTER — Other Ambulatory Visit: Payer: 59

## 2018-02-21 ENCOUNTER — Ambulatory Visit: Payer: 59

## 2018-02-21 NOTE — Progress Notes (Signed)
Kathryn  [phone] [phone]     ID: [name] DOB: 1966-01-08  MR#: [id number]  JJO#:[id number]  Patient Care Team: Harlan Stains, MD as PCP - General (Family Medicine) [name], MD as Consulting Physician (Obstetrics and Gynecology) Juanita Craver, MD as Consulting Physician (Gastroenterology) Servando Salina, MD as Consulting Physician (Obstetrics and Gynecology) Nickie Retort, MD (Inactive) as Consulting Physician (Urology) Gillis Ends, MD as Referring Physician (Obstetrics and Gynecology) OTHER MD: Festus Aloe [phone])   CHIEF COMPLAINT:  ovarian cancer   CURRENT TREATMENT: Cisplatin, gemcitabine   INTERVAL HISTORY: Kathryn Ramsey returns today for follow-up and treatment of her recurrent ovarian cancer. She is accompanied by her husband.  The patient continues on cisplatin on days 1 and 8 of each 21-day cycle. Today is day 6 cycle 1.   Since her last visit here, she has not undergone any new studies.  Results for Kathryn Ramsey, Kathryn Ramsey (MRN [id number]) as of 02/23/2018 08:41  Ref. Range 10/10/2017 11:12 11/07/2017 08:50 11/28/2017 08:32 01/16/2018 12:40 02/17/2018 09:30  Cancer Antigen (CA) 125 Latest Ref Range: 0.0 - 38.1 U/mL 39.5 (H) 38.9 (H) 48.1 (H) 1,372.0 (H) 4,173.0 (H)     REVIEW OF SYSTEMS: Kathryn Ramsey states that she feels horrible; she felt bad before chemotherapy, "but nothing like this." She states that after chemotherapy on 02/18/2018, she felt the best on 02/19/2018. She has felt nauseated, for which she has been drinking lemon ginger tea and Zofran for. She has not vomited. She feels pain in her abdomen; the pain mostly feels "heavy," but sometimes there are shooting pains, which are better with sitting and laying down. She is also experiencing burning in her hands and feet, mostly in the palms and soles. She is shakey after waking up at night. She has been experiencing constipation, which she treated with  milk of magnesea, walking, and prune juice, with little relief. She eventually had a hard bowel movement on 02/22/2018. She plans to continue to drink prune juice to help her with bowel regularity. The patient denies unusual headaches, visual changes, vomiting, or dizziness. There has been no unusual cough, phlegm production, or pleurisy. This been no change in bladder habits. The patient denies unexplained weight loss, bleeding, rash, or fever. A detailed review of systems was otherwise noncontributory.     HISTORY OF PRESENT ILLNESS: From Dr. Serita Grit original intake note 03/17/2015:  "Kathryn Ramsey is a very pleasant G4P4 who is seen in consultation at the request of Dr Collene Mares and Dr Garwin Brothers for peritoneal carcinomatosis. The patient has a history of a workup by urology for gross hematuria which included a pelvic examine was suggested for extrinsic mass effect on the bladder on cystoscopy. Cystoscopy took place on in December 2016. The patient denies abdominal pain bloating early satiety or abdominal distention.  On 08/03/2015 at [facility] she underwent a CT scan of the abdomen and pelvis as ordered by Dr Baruch Gouty. This revealed a 1.4 cm low attenuation lesion on the posterior right hepatic lobe suggestive of a hemangioma. Status post hysterectomy. No ovarian masses. Moderate ascites. Omental caking seen in the lateral left abdomen and pelvis. Peritoneal nodularity in the left lateral pelvic cul-de-sac. No gross extrinsic compression on the bladder was identified on imaging.  The patient was then seen by her gastroenterologist, Dr. Collene Mares, who performed a colonoscopy which was unremarkable.  Tumor markers were drawn on 08/04/2015 and these included a CA-125 that was elevated to 957, and a CEA that was normal at  1."  On 03/27/2015 the patient underwent diagnostic laparoscopy, exploratory laparotomy with bilateral salpingo-oophorectomy, omentectomy, and radical tumor debulking with optimal  cytoreduction (R0) of what proved to be a stage IIIc right fallopian tube cancer. She was treated adjuvantly with carboplatin and paclitaxel, as detailed below. Her CA 125 dropped from 1226 on 03/20/2015 to 26.1 by 06/16/2015.  Her subsequent history is as detailed below.  I PAST MEDICAL HISTORY: Past Medical History:  Diagnosis Date  . Acute sensory neuropathy 06/26/2015  . Allergy   . Anemia   . Asthma    illness induced asthma  . Blood transfusion without reported diagnosis    at age 17 years old D/T surgery to femur being crushed  . 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 Heart murmur    pt, states had a "working heart murmur"  . History of kidney stones   . PONV (postoperative nausea and vomiting)   . Skin cancer     PAST SURGICAL HISTORY: Past Surgical History:  Procedure Laterality Date  . 3 laporscopic proceedures    . ABDOMINAL HYSTERECTOMY     2010  . CHOLECYSTECTOMY     2010  . DILATION AND CURETTAGE OF UTERUS     1997  . LAPAROSCOPY N/A 03/27/2015   Procedure: DIAGNOSTIC LAPAROSCOPY ;  Surgeon: [name], MD;  Location: [facility];  Service: Gynecology;  Laterality: N/A;  . LAPAROSCOPY N/A 02/24/2016   Procedure: LAPAROSCOPY DIAGNOSTIC WITH PERITONEAL WASHINGS AND PERITONEAL BIOPSY;  Surgeon: [name], MD;  Location: [facility];  Service: Gynecology;  Laterality: N/A;  . LAPAROTOMY N/A 03/27/2015   Procedure: EXPLORATORY LAPAROTOMY, BILATERAL SALPINGO OOPHORECTOMY, OMENTECTOMY, RADICAL TUMOR DEBULKING;  Surgeon: [name], MD;  Location: [facility];  Service: Gynecology;  Laterality: N/A;  . sinus surgery 1994      FAMILY HISTORY Family History  Problem Relation Age of Onset  . Hypertension Father   . Skin cancer Father        nonmelanoma skin cancers in his late 45s  . Non-Hodgkin's lymphoma Maternal Aunt        dx. 26s; smoker  . Stroke Maternal Grandmother   . Other Mother        benign  meningioma dx. early-mid-70s; hysterectomy in her late 58s for heavy periods - still has ovaries  . Other Son        one son with pre-cancerous skin findings  . Other Daughter        cysts on ovaries and hx of heavy periods  . Other Sister        hysterectomy for cysts - still has ovaries  . Heart attack Maternal Grandfather   . Heart attack Paternal Grandfather   . Renal cancer Maternal Aunt 60       smoker  . Breast cancer Maternal Aunt 78  . Other Maternal Aunt        dx. benign brain tumor (meningioma) at 4; 2nd benign brain tumor in her late 68s; hx of radical hysterectomy at age 5  . Brain cancer Other        NOS tumor  The patient's parents are both living, in their late 32s as of January 2018. The patient has one brother, 3 sisters. There is no history of ovarian cancer in the family. One maternal aunt had breast and kidney cancer, another had non-Hodgkin's lymphoma.  GYNECOLOGIC HISTORY:  No LMP recorded.  Patient has had a hysterectomy. 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 Her husband Kathryn Ramsey is a Engineer, maintenance (IT). Their children are 70, 59, 67 and 14 y/o as of JAN 2018. The oldest works as an Electrical engineer in the [employer] in [place]. The other 3 children live in [place], the youngest currently attending [employer]. The patient has no grandchildren. She attends [employer]    ADVANCED DIRECTIVES:    HEALTH MAINTENANCE: Social History   Tobacco Use  . Smoking status: Never Smoker  . Smokeless tobacco: Never Used  Substance Use Topics  . Alcohol use: Yes    Comment:  3 glasses wine or beer/week  . Drug use: No     Colonoscopy:2016  PAP: s/p hyst  Bone density:   Allergies  Allergen Reactions  . Bee Venom Shortness Of Breath    swelling  . Compazine [Prochlorperazine Edisylate] Anaphylaxis, Anxiety and Palpitations  . Sulfa Antibiotics Shortness Of  Breath    Vomit , diarrhea , hives    Current Outpatient Medications  Medication Sig Dispense Refill  . dexamethasone (DECADRON) 4 MG tablet Take 2 tablets by mouth once a day on the day after cisplatin chemotherapy and then take 2 tablets two times a day for 2 days. Take with food. 30 tablet 1  . diphenhydrAMINE (BENADRYL) 50 MG capsule 50 mg Diphenhydramine (Benadryl) PO within 1 hour of the injection 1 capsule 0  . fluticasone (FLONASE) 50 MCG/ACT nasal spray Place 1 spray into both nostrils 2 (two) times daily. (Patient taking differently: Place 1 spray into both nostrils as needed. ) 16 g 2  . gabapentin (NEURONTIN) 100 MG capsule Take 1 capsule (100 mg total) by mouth 3 (three) times daily. 270 capsule 3  . lidocaine-prilocaine (EMLA) cream Apply 1 application topically as needed. 30 g 0  . loratadine (CLARITIN) 10 MG tablet Take 10 mg by mouth as needed.     . magnesium oxide (MAG-OX) 400 (241.3 Mg) MG tablet Take 1 tablet (400 mg total) by mouth daily. 60 tablet 3  . ondansetron (ZOFRAN) 8 MG tablet Take 1 tablet (8 mg total) by mouth 2 (two) times daily as needed. Start on the third day after cisplatin chemotherapy. 30 tablet 1  . prochlorperazine (COMPAZINE) 10 MG tablet Take 1 tablet (10 mg total) by mouth every 6 (six) hours as needed (Nausea or vomiting). 30 tablet 1   No current facility-administered medications for this visit.     OBJECTIVE: Middle-aged white Ramsey who appears stated age  64:   02/23/18 0813  BP: 98/69  Pulse: 76  Resp: 18  Temp: 98.5 F (36.9 C)  SpO2: 99%     Body mass index is 22.46 kg/m.   ECOG FS:1 - Symptomatic but completely ambulatory  Sclerae unicteric, EOMs intact No cervical or supraclavicular adenopathy Lungs no rales or rhonchi Heart regular rate and rhythm Abd soft, nontender, positive bowel sounds, the port site metastases are not palpable, overall the abdomen appears less distended, no masses palpable MSK no focal spinal  tenderness, no upper extremity lymphedema Neuro: nonfocal, well oriented, appropriate affect Breasts: Deferred  LAB RESULTS:  CMP     Component Value Date/Time   NA 141 02/17/2018 0930   NA 140 02/11/2017 0755   K 4.9 02/17/2018 0930   K 4.4 02/11/2017 0755   CL 104 02/17/2018 0930   CO2 28 02/17/2018  0930   CO2 26 02/11/2017 0755   GLUCOSE 96 02/17/2018 0930   GLUCOSE 93 02/11/2017 0755   BUN 6 02/17/2018 0930   BUN 7.9 02/11/2017 0755   CREATININE 0.68 02/17/2018 0930   CREATININE 0.79 01/16/2018 1240   CREATININE 0.7 02/11/2017 0755   CALCIUM 9.6 02/17/2018 0930   CALCIUM 9.6 02/11/2017 0755   PROT 7.2 02/17/2018 0930   PROT 7.5 02/11/2017 0755   ALBUMIN 3.9 02/17/2018 0930   ALBUMIN 4.4 02/11/2017 0755   AST 36 02/17/2018 0930   AST 25 01/16/2018 1240   AST 27 02/11/2017 0755   ALT 32 02/17/2018 0930   ALT 19 01/16/2018 1240   ALT 24 02/11/2017 0755   ALKPHOS 67 02/17/2018 0930   ALKPHOS 58 02/11/2017 0755   BILITOT 0.4 02/17/2018 0930   BILITOT 0.5 01/16/2018 1240   BILITOT 0.43 02/11/2017 0755   GFRNONAA >60 02/17/2018 0930   GFRNONAA >60 01/16/2018 1240   GFRAA >60 02/17/2018 0930   GFRAA >60 01/16/2018 1240    INo results found for: SPEP, UPEP  Lab Results  Component Value Date   WBC 4.3 02/23/2018   NEUTROABS 2.8 02/23/2018   HGB 14.2 02/23/2018   HCT 43.1 02/23/2018   MCV 95.4 02/23/2018   PLT 317 02/23/2018      Chemistry      Component Value Date/Time   NA 141 02/17/2018 0930   NA 140 02/11/2017 0755   K 4.9 02/17/2018 0930   K 4.4 02/11/2017 0755   CL 104 02/17/2018 0930   CO2 28 02/17/2018 0930   CO2 26 02/11/2017 0755   BUN 6 02/17/2018 0930   BUN 7.9 02/11/2017 0755   CREATININE 0.68 02/17/2018 0930   CREATININE 0.79 01/16/2018 1240   CREATININE 0.7 02/11/2017 0755      Component Value Date/Time   CALCIUM 9.6 02/17/2018 0930   CALCIUM 9.6 02/11/2017 0755   ALKPHOS 67 02/17/2018 0930   ALKPHOS 58 02/11/2017 0755   AST 36  02/17/2018 0930   AST 25 01/16/2018 1240   AST 27 02/11/2017 0755   ALT 32 02/17/2018 0930   ALT 19 01/16/2018 1240   ALT 24 02/11/2017 0755   BILITOT 0.4 02/17/2018 0930   BILITOT 0.5 01/16/2018 1240   BILITOT 0.43 02/11/2017 0755       No results found for: LABCA2  No components found for: [id number]  No results for input(s): INR in the last 168 hours.  Urinalysis    Component Value Date/Time   COLORURINE STRAW (A) 11/22/2017 0940   APPEARANCEUR CLEAR 11/22/2017 0940   LABSPEC 1.002 (L) 11/22/2017 0940   LABSPEC 1.005 10/09/2015 1239   PHURINE 7.0 11/22/2017 0940   GLUCOSEU NEGATIVE 11/22/2017 0940   GLUCOSEU Negative 10/09/2015 1239   HGBUR NEGATIVE 11/22/2017 0940   BILIRUBINUR NEGATIVE 11/22/2017 0940   BILIRUBINUR Negative 10/09/2015 1239   KETONESUR NEGATIVE 11/22/2017 0940   PROTEINUR NEGATIVE 11/22/2017 0940   UROBILINOGEN 0.2 10/09/2015 1239   NITRITE NEGATIVE 11/22/2017 0940   LEUKOCYTESUR NEGATIVE 11/22/2017 0940   LEUKOCYTESUR Negative 10/09/2015 1239    STUDIES: Mr Pelvis W Wo Contrast  Result Date: 02/03/2018 CLINICAL DATA:  Ovarian cancer. Patient with peritoneal enhancement in the pelvis on CT and a rim of hypermetabolism along the peritoneal lining of the pelvis on recent PET-CT. PET-CT also rim like activity over the liver capsule and inferior spleen. EXAM: MRI PELVIS WITHOUT AND WITH CONTRAST TECHNIQUE: Multiplanar multisequence MR imaging of the pelvis was performed both  before and after administration of intravenous contrast. CONTRAST:  7 cc Gadavist COMPARISON:  PET-CT 01/24/2018.  CT abdomen and pelvis 01/17/2018. FINDINGS: Urinary Tract: Bladder is nondistended. Urethra is normal in appearance. Bowel:  No evidence for bowel dilatation in the pelvis. Vascular/Lymphatic: Common iliac artery and vein patent bilaterally. No evidence for pelvic sidewall lymphadenopathy. Reproductive:  Uterus surgically absent.  There is no adnexal mass. Other: Small  volume free fluid is identified in the central and posterior pelvis. Fluid is homogeneous and T1 hypointense. T2 imaging shows a fluid fluid layer (image 11/sagittal series 10) although the fluid in each layer is homogeneous. Postcontrast imaging shows diffuse, thin, smooth enhancement involving the posterior peritoneum of the cul-de-sac (well demonstrated on axial image 49 of postcontrast T1 series 902). There is some associated peritoneal enhancement along the right pelvic sidewall as well. Minimal enhancement noted along the anterior left pelvic sidewall. The peritoneal enhancement is thin and smooth throughout with no measurable nodule or thickening evident. No mesenteric nodule or abnormal enhancing tissue is evident. Musculoskeletal: No abnormal marrow enhancement within the visualized bony anatomy. IMPRESSION: 1. As noted on prior imaging, the patient has small volume fluid in the central pelvis and cul-de-sac. The peritoneal lining of the posterior cul-de-sac shows thin, smooth enhancement throughout but no focal thickening or nodularity. Similar very subtle, thin peritoneal enhancement is seen along the mid right pelvic sidewall and anteriorly along the left pelvic sidewall. 2. No discernible enhancing mesenteric nodule or mass in the pelvis. Electronically Signed   By: Misty Stanley M.D.   On: 02/03/2018 18:32   Nm Pet Image Restag (ps) Skull Base To Thigh  Result Date: 01/25/2018 CLINICAL DATA:  Initial treatment strategy for germ-cell tumor. Ovarian cancer. Rising CA 125. EXAM: NUCLEAR MEDICINE PET SKULL BASE TO THIGH TECHNIQUE: 6.58 mCi F-18 FDG was injected intravenously. Full-ring PET imaging was performed from the skull base to thigh after the radiotracer. CT data was obtained and used for attenuation correction and anatomic localization. Fasting blood glucose: 88 mg/dl COMPARISON:  PET-CT 01/26/2016, abdominal MRI 06/02/2017, CT 11/28/2017 FINDINGS: Mediastinal blood pool activity: SUV max 1.78  NECK: No hypermetabolic lymph nodes in the neck. Incidental CT findings: none CHEST: No hypermetabolic mediastinal or hilar nodes. No suspicious pulmonary nodules on the CT scan. Incidental CT findings: none ABDOMEN/PELVIS: There is metabolic activity associated with the small volume of intraperitoneal free fluid in the deep RIGHT pelvis. There is subtle nodular thickening along the peritoneal surface which has localized metabolic activity (SUV max equal 4.8). The volume of fluid is increased from recent CT scan There is a rim of metabolic activity along the anterior margin liver and just deep to the spleen along the peritoneal surface. There is small amount fluid in these regions with no specific nodularity on this noncontrast exam. The activity along the liver margin is moderate with SUV max equal 4.2. Similar activity inferior to the spleen with SUV max equal 4.1 (images 98 and 101 of the fused data set respectively). No additional evidence metastatic disease in the abdomen pelvis. No hypermetabolic lymph nodes. No sidewall abnormality. Incidental CT findings: Post hysterectomy and cholecystectomy. SKELETON: No focal hypermetabolic activity to suggest skeletal metastasis. Incidental CT findings: none IMPRESSION: 1. Rim of metabolic activity associated with the fluid collection within the deep pelvis consistent with MALIGNANT ASCITES / PERITONEAL METASTASIS. Additionally rim of metabolic activity over liver capsule and inferior to the spleen also consistent with peritoneal metastasis. Overall disease is minimal but increasing from recent CT  scans. 2. No evidence of solid organ metastasis.  No skeletal metastasis. These results will be called to the ordering clinician or representative by the Radiologist Assistant, and communication documented in the PACS or zVision Dashboard. Electronically Signed   By: Suzy Bouchard M.D.   On: 01/25/2018 09:25   Ir Abdomen US Limited  Result Date: 02/20/2018 CLINICAL DATA:   Abdominal carcinomatosis.  Paracentesis requested. EXAM: LIMITED ABDOMEN ULTRASOUND FOR ASCITES TECHNIQUE: Limited ultrasound survey for ascites was performed in all four abdominal quadrants. COMPARISON:  MR 04/06/2017 and previous FINDINGS: There is a small amount of scattered ascites. No large pocket for safe therapeutic paracentesis. IMPRESSION: Small volume ascites.  Paracentesis deferred. Electronically Signed   By: Lucrezia Europe M.D.   On: 02/20/2018 15:20     ELIGIBLE FOR AVAILABLE RESEARCH PROTOCOL:  discussing protocols at [facility] [place]: 54 y.o. BRCA negative Kathryn Ramsey status post radical tumor debulking with optimal cytoreduction (R0) 03/27/2015 for a stage IIIC, high-grade right fallopian tube carcinoma  (a) baseline CA-125 was 1226.  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)  (c) tumor is PD-L1 negative (02/24/2016)  (d) tumor is strongly estrogen receptor positive, progesterone receptor negative (02/24/2016)  (1) adjuvant chemotherapy consisted of carboplatin and paclitaxel for 6 doses, begun 04/14/2015, completed 08/11/2015  (a) paclitaxel was omitted from cycle 5 and dose reduced on cycle 6 because of neuropathy   (b) a "make up" dose of paclitaxel was given 08/25/2015  (c) last carboplatin dose was 08/11/2015  (d) CA-125 normalized by 06/16/2015   (2) FIRST RECURRENCE: January 2018  (a) CA-125 rise beginning November 2017 led to CT scans and PET scans December 2017, all negative  (b) exploratory laparotomy 02/24/2016 showed pathologically confirmed recurrence, with miliary disease involving all examined surfaces  (c) port-site metastases noted 03/08/2016  (d) the January 2018 tumor sample was tested for the estrogen receptor and he was 80 percent positive with strong staining,  progesterone receptor negative  (3) carboplatin/liposomal doxorubicin started 03/05/2016, repeated every three weeks x4, completed 06/04/2016.  (a) cycle 3 delayed one week because of neutropenia; OnPro added  (b) CA 125 normalized after cycle 3  (4) RUCAPARIB maintenance started 07/02/2016  (a) restaging 10/01/2016: Normal CA 125, negative CT of the abdomen and pelvis  (b) labs 11/24/2016 shows a rise in her CA 125-43.4, with continuing rise thereafter  (c) rucaparib discontinued October 2018 with rising CA 125  (5) anastrozole started December 23, 2016-stopped after a couple of weeks with poor tolerance  SECOND RECURRENCE: (6) Gemcitabine/Bevacizumab started on 02/04/2017  (a) gemcitabine omitted cycle 4 because of intercurrent infection  (b) gemcitabine resumed with cycle 5, with intervening rise in  Ca 125  (c) repeat CT scan of the abdomen and pelvis on 05/26/2017 does not confirm obvious disease progression  (d) cycle 5 of gemcitabine/bevacizumab delayed 1 week   (e) gemcitabine/ bevacizumab discontinued after 6 cycles, with rising CA 125 (last dose 06/24/2017)  (7) foundation one testing did not show a high mutation burden and the microsatellite status was stable.  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  These were not immediately actionable  (8) Abraxane started 07/12/2017, given weekly or weeks 1 and 2 of each 3 weeks cycle, last dose 11/15/2017 (5 cycles)  (a) cycle 2 started 08/09/2017, will be day 1 day 8 only  (b)  cycle 3 scheduled to start 09/13/2017, day 1 day 8 at patient request  (c) CT of the abdomen and pelvis 11/28/2017 shows no measurable disease  (d) PET scan 01/25/2018 and MRI of the pelvis 02/03/2018 show no measurable disease  (e) patient has symptomatic ascites and a rapidly rising Ca1 25  (9) to start cis-platinum/gemcitabine days 1 and 8 of each 21-day cycle on 02/18/2018   PLAN: Kathryn Ramsey is having quite a few symptoms and it is difficult for me to  sort out how much is due to the cancer itself, and how much is due to the treatment she received.  Certainly cisplatin is a harsh drug.  She did quite well as far as nausea is concerned and while on steroids she seemed to be doing fairly well, with symptoms getting worse when she came off the Decadron.  Certainly the constipation has not helped.  It was a great disappointment to her that she was not able to undergo paracentesis.  In the past she has found that to be great relief.  She is having plantar Palmar symptoms but no worsening neuropathy symptoms.  Her counts today are remarkably good.  At this point I think we need to press on with the chemotherapy  We are going to fix what we can fix.  She will take magnesium citrate today.  She will continue her stool softeners, and she will add MiraLAX as needed.  She knows she needs to have a soft bowel movement at least every 48 hours and if on the third day she has not had a bowel movement she will do the magnesium citrate again.    We are going to extend the Decadron, and in addition to what she is already scheduled to receive, on day 4 and 5 she will take 4 mg with breakfast.  She is scheduled for port placement on 03/08/2018.  I have written her for EMLA cream and explained how to use it.  I have also written so that all her labs in the future will be through flush  I will check lab work on 03/01/2018 just to check for nadirs.  At this point I do not anticipate her needing Neulasta.  She will see me again on 03/10/2018 and her next chemotherapy will be moved to Mondays, 03/13/2018 at her request.  I think this is reasonable not only so she can enjoy the weekend but also so if there are problems she can come into our office and be treated instead of having to go to the emergency room  On 03/10/2018 we will repeat a Ca1 25  She knows to call for any other issues that may develop before the next visit.    Kathryn Ramsey, Virgie Dad, MD  02/23/18 9:01  AM Medical Oncology and Hematology [facility] [address] [place], [place] [address] Tel. [phone]    Fax. [phone]   I, Jacqualyn Posey am acting as a Education administrator for Chauncey Cruel, MD.   I, Lurline Del MD, have reviewed the above documentation for accuracy and completeness, and I agree with the above.

## 2018-02-22 ENCOUNTER — Telehealth: Payer: Self-pay

## 2018-02-22 DIAGNOSIS — C5701 Malignant neoplasm of right fallopian tube: Secondary | ICD-10-CM

## 2018-02-22 DIAGNOSIS — C762 Malignant neoplasm of abdomen: Secondary | ICD-10-CM

## 2018-02-22 NOTE — Telephone Encounter (Signed)
Patient called to report severe constipation, and request to get a port placed.   Nurse returned call.  Patient reports last BM on Saturday, 02/18/2018.  Patient c/o nausea over past few days, however has improved today.  Denies any fever, does have generalized weakness since treatment.  Patient is utilizing OTC stool softeners and will be getting prune juice today.  Nurse encouraged milk of magnesium, pt voiced agreement will get that today and try.  Pt will update if no BM with MOM.  Will place orders for port placement.  Pt will continue to utilize antiemetics as needed.  No further needs at this time.

## 2018-02-23 ENCOUNTER — Inpatient Hospital Stay (HOSPITAL_BASED_OUTPATIENT_CLINIC_OR_DEPARTMENT_OTHER): Payer: 59 | Admitting: Oncology

## 2018-02-23 ENCOUNTER — Inpatient Hospital Stay: Payer: 59

## 2018-02-23 ENCOUNTER — Telehealth: Payer: Self-pay | Admitting: Oncology

## 2018-02-23 VITALS — BP 98/69 | HR 76 | Temp 98.5°F | Resp 18 | Ht 63.0 in | Wt 126.8 lb

## 2018-02-23 DIAGNOSIS — Z5189 Encounter for other specified aftercare: Secondary | ICD-10-CM | POA: Diagnosis not present

## 2018-02-23 DIAGNOSIS — C762 Malignant neoplasm of abdomen: Secondary | ICD-10-CM | POA: Diagnosis not present

## 2018-02-23 DIAGNOSIS — G620 Drug-induced polyneuropathy: Secondary | ICD-10-CM

## 2018-02-23 DIAGNOSIS — K5900 Constipation, unspecified: Secondary | ICD-10-CM

## 2018-02-23 DIAGNOSIS — C5701 Malignant neoplasm of right fallopian tube: Secondary | ICD-10-CM

## 2018-02-23 DIAGNOSIS — Z85828 Personal history of other malignant neoplasm of skin: Secondary | ICD-10-CM

## 2018-02-23 DIAGNOSIS — Z79899 Other long term (current) drug therapy: Secondary | ICD-10-CM

## 2018-02-23 DIAGNOSIS — C786 Secondary malignant neoplasm of retroperitoneum and peritoneum: Secondary | ICD-10-CM

## 2018-02-23 DIAGNOSIS — D701 Agranulocytosis secondary to cancer chemotherapy: Secondary | ICD-10-CM

## 2018-02-23 DIAGNOSIS — T451X5A Adverse effect of antineoplastic and immunosuppressive drugs, initial encounter: Secondary | ICD-10-CM

## 2018-02-23 LAB — CBC WITH DIFFERENTIAL/PLATELET
Abs Immature Granulocytes: 0 10*3/uL (ref 0.00–0.07)
Basophils Absolute: 0 10*3/uL (ref 0.0–0.1)
Basophils Relative: 0 %
Eosinophils Absolute: 0 10*3/uL (ref 0.0–0.5)
Eosinophils Relative: 1 %
HCT: 43.1 % (ref 36.0–46.0)
Hemoglobin: 14.2 g/dL (ref 12.0–15.0)
Immature Granulocytes: 0 %
Lymphocytes Relative: 34 %
Lymphs Abs: 1.5 10*3/uL (ref 0.7–4.0)
MCH: 31.4 pg (ref 26.0–34.0)
MCHC: 32.9 g/dL (ref 30.0–36.0)
MCV: 95.4 fL (ref 80.0–100.0)
Monocytes Absolute: 0 10*3/uL — ABNORMAL LOW (ref 0.1–1.0)
Monocytes Relative: 0 %
Neutro Abs: 2.8 10*3/uL (ref 1.7–7.7)
Neutrophils Relative %: 65 %
Platelets: 317 10*3/uL (ref 150–400)
RBC: 4.52 MIL/uL (ref 3.87–5.11)
RDW: 12 % (ref 11.5–15.5)
WBC: 4.3 10*3/uL (ref 4.0–10.5)
nRBC: 0 % (ref 0.0–0.2)

## 2018-02-23 LAB — COMPREHENSIVE METABOLIC PANEL
ALT: 163 U/L — ABNORMAL HIGH (ref 0–44)
AST: 90 U/L — ABNORMAL HIGH (ref 15–41)
Albumin: 3.9 g/dL (ref 3.5–5.0)
Alkaline Phosphatase: 76 U/L (ref 38–126)
Anion gap: 12 (ref 5–15)
BUN: 17 mg/dL (ref 6–20)
CO2: 28 mmol/L (ref 22–32)
Calcium: 9.5 mg/dL (ref 8.9–10.3)
Chloride: 99 mmol/L (ref 98–111)
Creatinine, Ser: 0.76 mg/dL (ref 0.44–1.00)
GFR calc Af Amer: >60 mL/min (ref >60)
GFR calc non Af Amer: >60 mL/min (ref >60)
Glucose, Bld: 91 mg/dL (ref 70–99)
Potassium: 4.4 mmol/L (ref 3.5–5.1)
Sodium: 139 mmol/L (ref 135–145)
Total Bilirubin: 0.5 mg/dL (ref 0.3–1.2)
Total Protein: 7.6 g/dL (ref 6.5–8.1)

## 2018-02-23 MED ORDER — LIDOCAINE-PRILOCAINE 2.5-2.5 % EX CREA: 1.0000 "application " | TOPICAL_CREAM | CUTANEOUS | 0 refills | Status: DC | PRN

## 2018-02-23 NOTE — Telephone Encounter (Signed)
Gave avs and calendar could not schedule pt for 2/3 with MD no flush and lab he wanted 8

## 2018-02-24 ENCOUNTER — Telehealth: Payer: Self-pay | Admitting: *Deleted

## 2018-02-24 ENCOUNTER — Inpatient Hospital Stay: Payer: 59

## 2018-02-24 ENCOUNTER — Other Ambulatory Visit: Payer: Self-pay | Admitting: Oncology

## 2018-02-24 VITALS — BP 106/74 | HR 78 | Temp 98.1°F | Resp 18

## 2018-02-24 DIAGNOSIS — Z5189 Encounter for other specified aftercare: Secondary | ICD-10-CM | POA: Diagnosis not present

## 2018-02-24 DIAGNOSIS — C786 Secondary malignant neoplasm of retroperitoneum and peritoneum: Secondary | ICD-10-CM

## 2018-02-24 DIAGNOSIS — Z7189 Other specified counseling: Secondary | ICD-10-CM

## 2018-02-24 DIAGNOSIS — C762 Malignant neoplasm of abdomen: Secondary | ICD-10-CM

## 2018-02-24 DIAGNOSIS — C5701 Malignant neoplasm of right fallopian tube: Secondary | ICD-10-CM

## 2018-02-24 DIAGNOSIS — C801 Malignant (primary) neoplasm, unspecified: Secondary | ICD-10-CM

## 2018-02-24 MED ORDER — SODIUM CHLORIDE 0.9 % IV SOLN
40.0000 mg/m2 | Freq: Once | INTRAVENOUS | Status: AC
  Administered 2018-02-24: 63 mg via INTRAVENOUS
  Filled 2018-02-24: qty 63

## 2018-02-24 MED ORDER — SODIUM CHLORIDE 0.9 % IV SOLN
Freq: Once | INTRAVENOUS | Status: AC
  Administered 2018-02-24: 12:00:00 via INTRAVENOUS
  Filled 2018-02-24: qty 250

## 2018-02-24 MED ORDER — SODIUM CHLORIDE 0.9 % IV SOLN
Freq: Once | INTRAVENOUS | Status: AC
  Administered 2018-02-24: 11:00:00 via INTRAVENOUS
  Filled 2018-02-24: qty 5

## 2018-02-24 MED ORDER — POTASSIUM CHLORIDE 2 MEQ/ML IV SOLN
Freq: Once | INTRAVENOUS | Status: AC
  Administered 2018-02-24: 09:00:00 via INTRAVENOUS
  Filled 2018-02-24: qty 10

## 2018-02-24 MED ORDER — SODIUM CHLORIDE 0.9 % IV SOLN
Freq: Once | INTRAVENOUS | Status: AC
  Administered 2018-02-24: 08:00:00 via INTRAVENOUS
  Filled 2018-02-24: qty 250

## 2018-02-24 MED ORDER — SODIUM CHLORIDE 0.9 % IV SOLN
800.0000 mg/m2 | Freq: Once | INTRAVENOUS | Status: AC
  Administered 2018-02-24: 1254 mg via INTRAVENOUS
  Filled 2018-02-24: qty 32.98

## 2018-02-24 MED ORDER — ALUM & MAG HYDROXIDE-SIMETH 200-200-20 MG/5ML PO SUSP
30.0000 mL | ORAL | Status: DC | PRN
  Administered 2018-02-24: 30 mL via ORAL

## 2018-02-24 MED ORDER — PALONOSETRON HCL INJECTION 0.25 MG/5ML
0.2500 mg | Freq: Once | INTRAVENOUS | Status: AC
  Administered 2018-02-24: 0.25 mg via INTRAVENOUS

## 2018-02-24 MED ORDER — PALONOSETRON HCL INJECTION 0.25 MG/5ML
INTRAVENOUS | Status: AC
  Filled 2018-02-24: qty 5

## 2018-02-24 MED ORDER — ALUM & MAG HYDROXIDE-SIMETH 200-200-20 MG/5ML PO SUSP
ORAL | Status: AC
  Filled 2018-02-24: qty 30

## 2018-02-24 NOTE — Telephone Encounter (Signed)
Per review of elevated AST and ALT by MD - order given to proceed with treatment today. Informed infusion room nurse.

## 2018-02-24 NOTE — Patient Instructions (Signed)
[facility] Discharge Instructions for Patients Receiving Chemotherapy  Today you received the following chemotherapy agents Gemzar, Cisplatin. To help prevent nausea and vomiting after your treatment, we encourage you to take your nausea medication. DO NOT TAKE ZOFRAN FOR THREE DAYS AFTER TREATMENT.   If you develop nausea and vomiting that is not controlled by your nausea medication, call the clinic.   BELOW ARE SYMPTOMS THAT SHOULD BE REPORTED IMMEDIATELY:  *FEVER GREATER THAN 100.5 F  *CHILLS WITH OR WITHOUT FEVER  NAUSEA AND VOMITING THAT IS NOT CONTROLLED WITH YOUR NAUSEA MEDICATION  *UNUSUAL SHORTNESS OF BREATH  *UNUSUAL BRUISING OR BLEEDING  TENDERNESS IN MOUTH AND THROAT WITH OR WITHOUT PRESENCE OF ULCERS  *URINARY PROBLEMS  *BOWEL PROBLEMS  UNUSUAL RASH Items with * indicate a potential emergency and should be followed up as soon as possible.  Feel free to call the clinic should you have any questions or concerns. The clinic phone number is [phone].  Please show the [facility] at check-in to the Emergency Department and triage nurse.

## 2018-03-06 ENCOUNTER — Encounter: Payer: Self-pay | Admitting: Adult Health

## 2018-03-06 ENCOUNTER — Telehealth: Payer: Self-pay | Admitting: *Deleted

## 2018-03-06 ENCOUNTER — Inpatient Hospital Stay (HOSPITAL_BASED_OUTPATIENT_CLINIC_OR_DEPARTMENT_OTHER): Payer: 59 | Admitting: Adult Health

## 2018-03-06 VITALS — BP 101/65 | HR 69 | Temp 98.6°F | Resp 18 | Ht 63.0 in | Wt 122.7 lb

## 2018-03-06 DIAGNOSIS — G893 Neoplasm related pain (acute) (chronic): Secondary | ICD-10-CM

## 2018-03-06 DIAGNOSIS — Z79899 Other long term (current) drug therapy: Secondary | ICD-10-CM

## 2018-03-06 DIAGNOSIS — C762 Malignant neoplasm of abdomen: Secondary | ICD-10-CM

## 2018-03-06 DIAGNOSIS — Z5189 Encounter for other specified aftercare: Secondary | ICD-10-CM | POA: Diagnosis not present

## 2018-03-06 DIAGNOSIS — C786 Secondary malignant neoplasm of retroperitoneum and peritoneum: Secondary | ICD-10-CM | POA: Diagnosis not present

## 2018-03-06 DIAGNOSIS — C5701 Malignant neoplasm of right fallopian tube: Secondary | ICD-10-CM

## 2018-03-06 DIAGNOSIS — Z85828 Personal history of other malignant neoplasm of skin: Secondary | ICD-10-CM

## 2018-03-06 MED ORDER — TRAMADOL HCL 50 MG PO TABS: 50.0000 mg | ORAL_TABLET | Freq: Four times a day (QID) | ORAL | 0 refills | Status: DC | PRN

## 2018-03-06 MED ORDER — OMEPRAZOLE 40 MG PO CPDR: 40.0000 mg | DELAYED_RELEASE_CAPSULE | Freq: Every day | ORAL | 5 refills | Status: DC

## 2018-03-06 NOTE — Telephone Encounter (Signed)
This RN returned VM left by the patient stating " pain is getting the better of me and I am using the max amount of tylenol and ibuprofen Dr Jana Hakim recommended "  Kathryn Ramsey stated she is doing the max amount of above meds as well as " keeping my stools soft with use of colace and hydrating well "  She states pain is " excruciating at times and then subsides and I am trying to stay ahead of it with the tylenol and ibuprofen "  Kathryn Ramsey also stated a " mental thing " " If the pain is because of the chemo then I feel like I can tolerate doing this but it's getting the better of me "  This RN discussed above- including pain may not be that the chemo isn't working or her tumor is growing but that her body is more fatigued from the treatment regimen and therefore pain is more noticeable.  Kathryn Ramsey is concerned about using the " max dose of medications and having the port placed this Wednesday "  Above discussed  and well as probable need for stronger pain med that would allow her to use less with better outcome and function.  Plan per above is pt will come in today to be seen for pain medication changes.  She is scheduled for follow up with MD post lab draw ( date of lab needs to be changed to 1/23 for results to be available on 1/24 with visit ).  This note will be sent to attending provider per added visit today.

## 2018-03-06 NOTE — Progress Notes (Signed)
[facility]  [phone] [phone]     ID: [name] DOB: 1965/08/31  MR#: [id number]  GDJ#:[id number]  Patient Care Team: Harlan Stains, MD as PCP - General (Family Medicine) [name], MD as Consulting Physician (Obstetrics and Gynecology) Juanita Craver, MD as Consulting Physician (Gastroenterology) Servando Salina, MD as Consulting Physician (Obstetrics and Gynecology) Nickie Retort, MD (Inactive) as Consulting Physician (Urology) Gillis Ends, MD as Referring Physician (Obstetrics and Gynecology) OTHER MD: Festus Aloe [phone])   CHIEF COMPLAINT:  ovarian cancer   CURRENT TREATMENT: Cisplatin, gemcitabine   INTERVAL HISTORY: Leara returns today for follow-up and treatment of her recurrent ovarian cancer. She is accompanied by her husband.  The patient continues on cisplatin and Gemcitabine on days 1 and 8 of each 21-day cycle.  She has received two doses of this.    REVIEW OF SYSTEMS: Shanta is having increased abdominal pain x 2 weeks.  She has been taking tylenol and Ibuprofen around the clock without much relief.  She notes a generalized pain.  She notes occasional pain that shoots up into her shoulder.  She notes that she feels like the fluid in her stomach has decreased.  She underwent an ultrasound on 02/20/2018 and they couldn't identify an area to tap.  She feels like she needs something for pain.    Kineta is having regular bowel movements.  She denies any fevers or chills.  A detailed ROS was otherwise non contributory.     HISTORY OF PRESENT ILLNESS: From Dr. Serita Grit original intake note 03/17/2015:  "Jissell Trafton is a very pleasant G4P4 who is seen in consultation at the request of Dr Collene Mares and Dr Garwin Brothers for peritoneal carcinomatosis. The patient has a history of a workup by urology for gross hematuria which included a pelvic examine was suggested for extrinsic mass effect on the bladder on  cystoscopy. Cystoscopy took place on in December 2016. The patient denies abdominal pain bloating early satiety or abdominal distention.  On 08/03/2015 at [facility] she underwent a CT scan of the abdomen and pelvis as ordered by Dr Baruch Gouty. This revealed a 1.4 cm low attenuation lesion on the posterior right hepatic lobe suggestive of a hemangioma. Status post hysterectomy. No ovarian masses. Moderate ascites. Omental caking seen in the lateral left abdomen and pelvis. Peritoneal nodularity in the left lateral pelvic cul-de-sac. No gross extrinsic compression on the bladder was identified on imaging.  The patient was then seen by her gastroenterologist, Dr. Collene Mares, who performed a colonoscopy which was unremarkable.  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 She was treated adjuvantly with carboplatin and paclitaxel, as detailed below. Her CA 125 dropped from 1226 on 03/20/2015 to 26.1 by 06/16/2015.  Her subsequent history is as detailed below.  I PAST MEDICAL HISTORY: Past Medical History:  Diagnosis Date  . Acute sensory neuropathy 06/26/2015  . Allergy   . Anemia   . Asthma    illness induced asthma  . Blood transfusion without reported diagnosis    at age 49 years old D/T surgery to femur being crushed  . Complication of anesthesia    hx. of allergic to ether (had surgery at 5 years  and had reaction to the ether  . Heart murmur    pt, states had a "working heart murmur"  . History of kidney stones   . PONV (postoperative nausea and vomiting)   . Skin cancer     PAST SURGICAL HISTORY: Past Surgical History:  Procedure Laterality  Date  . 3 laporscopic proceedures    . ABDOMINAL HYSTERECTOMY     2010  . CHOLECYSTECTOMY     2010  . DILATION AND CURETTAGE OF UTERUS     1997  . LAPAROSCOPY N/A 03/27/2015   Procedure: DIAGNOSTIC LAPAROSCOPY ;  Surgeon: [name], MD;  Location: [facility];  Service: Gynecology;  Laterality: N/A;  . LAPAROSCOPY N/A 02/24/2016   Procedure: LAPAROSCOPY DIAGNOSTIC WITH PERITONEAL WASHINGS AND PERITONEAL BIOPSY;  Surgeon: [name], MD;  Location: [facility];  Service: Gynecology;  Laterality: N/A;  . LAPAROTOMY N/A 03/27/2015   Procedure: EXPLORATORY LAPAROTOMY, BILATERAL SALPINGO OOPHORECTOMY, OMENTECTOMY, RADICAL TUMOR DEBULKING;  Surgeon: [name], MD;  Location: [facility];  Service: Gynecology;  Laterality: N/A;  . sinus surgery 1994      FAMILY HISTORY Family History  Problem Relation Age of Onset  . Hypertension Father   . Skin cancer Father        nonmelanoma skin cancers in his late 30s  . Non-Hodgkin's lymphoma Maternal Aunt        dx. 65s; smoker  . Stroke Maternal Grandmother   . Other Mother        benign meningioma dx. early-mid-70s; hysterectomy in her late 56s for heavy periods - still has ovaries  . Other Son        one son with pre-cancerous skin findings  . Other Daughter        cysts on ovaries and hx of heavy periods  . Other Sister        hysterectomy for cysts - still has ovaries  . Heart attack Maternal Grandfather   . Heart attack Paternal Grandfather   . Renal cancer Maternal Aunt 60       smoker  . Breast cancer Maternal Aunt 78  . Other Maternal Aunt        dx. benign brain tumor (meningioma) at 65; 2nd benign brain tumor in her late 22s; hx of radical hysterectomy at age 20  . Brain cancer Other        NOS tumor  The patient's parents are both living, in their late 76s as of January 2018. The patient has one brother, 3 sisters. There is no history of ovarian cancer in the family. One maternal aunt had breast and kidney cancer, another had non-Hodgkin's  lymphoma.  GYNECOLOGIC HISTORY:  No LMP recorded. Patient has had a hysterectomy. 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 Her husband [name] is a Engineer, maintenance (IT). Their children are 73, 47, 66 and 95 y/o as of JAN 2018. The oldest works as an Electrical engineer in the [employer] in [place]. The other 3 children live in [place], the youngest currently attending [place]. The patient has no grandchildren. She attends [employer]     ADVANCED DIRECTIVES:    HEALTH MAINTENANCE: Social History   Tobacco Use  . Smoking status: Never Smoker  . Smokeless tobacco: Never Used  Substance Use Topics  . Alcohol use: Yes    Comment:  3 glasses wine or beer/week  . Drug use: No  Colonoscopy:2016  PAP: s/p hyst  Bone density:   Allergies  Allergen Reactions  . Bee Venom Shortness Of Breath    swelling  . Compazine [Prochlorperazine Edisylate] Anaphylaxis, Anxiety and Palpitations  . Sulfa Antibiotics Shortness Of Breath    Vomit , diarrhea , hives    Current Outpatient Medications  Medication Sig Dispense Refill  . dexamethasone (DECADRON) 4 MG tablet Take 2 tablets by mouth once a day on the day after cisplatin chemotherapy and then take 2 tablets two times a day for 2 days. Take with food. 30 tablet 1  . fluticasone (FLONASE) 50 MCG/ACT nasal spray Place 1 spray into both nostrils 2 (two) times daily. (Patient taking differently: Place 1 spray into both nostrils as needed. ) 16 g 2  . gabapentin (NEURONTIN) 100 MG capsule Take 1 capsule (100 mg total) by mouth 3 (three) times daily. 270 capsule 3  . lidocaine-prilocaine (EMLA) cream Apply 1 application topically as needed. 30 g 0  . loratadine (CLARITIN) 10 MG tablet Take 10 mg by mouth as needed.     . magnesium oxide (MAG-OX) 400 (241.3 Mg) MG tablet Take 1 tablet (400 mg total) by mouth daily. 60 tablet 3  . ondansetron (ZOFRAN)  8 MG tablet Take 1 tablet (8 mg total) by mouth 2 (two) times daily as needed. Start on the third day after cisplatin chemotherapy. 30 tablet 1  . sertraline (ZOLOFT) 50 MG tablet Take 50 mg by mouth daily.    . diphenhydrAMINE (BENADRYL) 50 MG capsule 50 mg Diphenhydramine (Benadryl) PO within 1 hour of the injection 1 capsule 0  . prochlorperazine (COMPAZINE) 10 MG tablet Take 1 tablet (10 mg total) by mouth every 6 (six) hours as needed (Nausea or vomiting). (Patient not taking: Reported on 03/06/2018) 30 tablet 1   No current facility-administered medications for this visit.     OBJECTIVE:   Vitals:   03/06/18 1337  BP: 101/65  Pulse: 69  Resp: 18  Temp: 98.6 F (37 C)  SpO2: 100%     Body mass index is 21.74 kg/m.   ECOG FS:1 - Symptomatic but completely ambulatory GENERAL: Patient is a tired appearing female in no acute distress HEENT:  Sclerae anicteric.  Oropharynx clear and moist. No ulcerations or evidence of oropharyngeal candidiasis. Neck is supple.  NODES:  No cervical, supraclavicular, or axillary lymphadenopathy palpated.  LUNGS:  Clear to auscultation bilaterally.  No wheezes or rhonchi. HEART:  Regular rate and rhythm. No murmur appreciated. ABDOMEN:  Distended, slightly tender throughout.  Positive, normoactive bowel sounds. No organomegaly palpated. MSK:  No focal spinal tenderness to palpation. Full range of motion bilaterally in the upper extremities. EXTREMITIES:  No peripheral edema.   SKIN:  Clear with no obvious rashes or skin changes. No nail dyscrasia. NEURO:  Nonfocal. Well oriented.  Appropriate affect.    LAB RESULTS:  CMP     Component Value Date/Time   NA 139 02/23/2018 0806   NA 140 02/11/2017 0755   K 4.4 02/23/2018 0806   K 4.4 02/11/2017 0755   CL 99 02/23/2018 0806   CO2 28 02/23/2018 0806   CO2 26 02/11/2017 0755   GLUCOSE 91 02/23/2018 0806   GLUCOSE 93 02/11/2017 0755   BUN 17 02/23/2018 0806   BUN 7.9 02/11/2017 0755    CREATININE 0.76 02/23/2018 0806   CREATININE 0.79 01/16/2018 1240   CREATININE 0.7 02/11/2017 0755   CALCIUM 9.5 02/23/2018 0806   CALCIUM 9.6 02/11/2017 0755  PROT 7.6 02/23/2018 0806   PROT 7.5 02/11/2017 0755   ALBUMIN 3.9 02/23/2018 0806   ALBUMIN 4.4 02/11/2017 0755   AST 90 (H) 02/23/2018 0806   AST 25 01/16/2018 1240   AST 27 02/11/2017 0755   ALT 163 (H) 02/23/2018 0806   ALT 19 01/16/2018 1240   ALT 24 02/11/2017 0755   ALKPHOS 76 02/23/2018 0806   ALKPHOS 58 02/11/2017 0755   BILITOT 0.5 02/23/2018 0806   BILITOT 0.5 01/16/2018 1240   BILITOT 0.43 02/11/2017 0755   GFRNONAA >60 02/23/2018 0806   GFRNONAA >60 01/16/2018 1240   GFRAA >60 02/23/2018 0806   GFRAA >60 01/16/2018 1240    INo results found for: SPEP, UPEP  Lab Results  Component Value Date   WBC 4.3 02/23/2018   NEUTROABS 2.8 02/23/2018   HGB 14.2 02/23/2018   HCT 43.1 02/23/2018   MCV 95.4 02/23/2018   PLT 317 02/23/2018      Chemistry      Component Value Date/Time   NA 139 02/23/2018 0806   NA 140 02/11/2017 0755   K 4.4 02/23/2018 0806   K 4.4 02/11/2017 0755   CL 99 02/23/2018 0806   CO2 28 02/23/2018 0806   CO2 26 02/11/2017 0755   BUN 17 02/23/2018 0806   BUN 7.9 02/11/2017 0755   CREATININE 0.76 02/23/2018 0806   CREATININE 0.79 01/16/2018 1240   CREATININE 0.7 02/11/2017 0755      Component Value Date/Time   CALCIUM 9.5 02/23/2018 0806   CALCIUM 9.6 02/11/2017 0755   ALKPHOS 76 02/23/2018 0806   ALKPHOS 58 02/11/2017 0755   AST 90 (H) 02/23/2018 0806   AST 25 01/16/2018 1240   AST 27 02/11/2017 0755   ALT 163 (H) 02/23/2018 0806   ALT 19 01/16/2018 1240   ALT 24 02/11/2017 0755   BILITOT 0.5 02/23/2018 0806   BILITOT 0.5 01/16/2018 1240   BILITOT 0.43 02/11/2017 0755       No results found for: LABCA2  No components found for: MEQAS341  No results for input(s): INR in the last 168 hours.  Urinalysis    Component Value Date/Time   COLORURINE STRAW (A)  11/22/2017 0940   APPEARANCEUR CLEAR 11/22/2017 0940   LABSPEC 1.002 (L) 11/22/2017 0940   LABSPEC 1.005 10/09/2015 1239   PHURINE 7.0 11/22/2017 0940   GLUCOSEU NEGATIVE 11/22/2017 0940   GLUCOSEU Negative 10/09/2015 1239   HGBUR NEGATIVE 11/22/2017 0940   BILIRUBINUR NEGATIVE 11/22/2017 0940   BILIRUBINUR Negative 10/09/2015 1239   KETONESUR NEGATIVE 11/22/2017 0940   PROTEINUR NEGATIVE 11/22/2017 0940   UROBILINOGEN 0.2 10/09/2015 1239   NITRITE NEGATIVE 11/22/2017 0940   LEUKOCYTESUR NEGATIVE 11/22/2017 0940   LEUKOCYTESUR Negative 10/09/2015 1239    STUDIES: Ir Abdomen US Limited  Result Date: 02/20/2018 CLINICAL DATA:  Abdominal carcinomatosis.  Paracentesis requested. EXAM: LIMITED ABDOMEN ULTRASOUND FOR ASCITES TECHNIQUE: Limited ultrasound survey for ascites was performed in all four abdominal quadrants. COMPARISON:  MR 04/06/2017 and previous FINDINGS: There is a small amount of scattered ascites. No large pocket for safe therapeutic paracentesis. IMPRESSION: Small volume ascites.  Paracentesis deferred. Electronically Signed   By: Lucrezia Europe M.D.   On: 02/20/2018 15:20     ELIGIBLE FOR AVAILABLE RESEARCH PROTOCOL:  discussing protocols at [facility] at [place]: 53 y.o. BRCA negative [employer] woman status post radical tumor debulking with optimal cytoreduction (R0) 03/27/2015 for a stage IIIC, high-grade right fallopian tube carcinoma  (a) baseline CA-125 was 1226.  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  (c.1347A>G (p.Lys449Lys)  (c) tumor is PD-L1 negative (02/24/2016)  (d) tumor is strongly estrogen receptor positive, progesterone receptor negative (02/24/2016)  (1) adjuvant chemotherapy consisted of carboplatin and paclitaxel for 6 doses, begun 04/14/2015, completed 08/11/2015  (a) paclitaxel was omitted  from cycle 5 and dose reduced on cycle 6 because of neuropathy   (b) a "make up" dose of paclitaxel was given 08/25/2015  (c) last carboplatin dose was 08/11/2015  (d) CA-125 normalized by 06/16/2015   (2) FIRST RECURRENCE: January 2018  (a) CA-125 rise beginning November 2017 led to CT scans and PET scans December 2017, all negative  (b) exploratory laparotomy 02/24/2016 showed pathologically confirmed recurrence, with miliary disease involving all examined surfaces  (c) port-site metastases noted 03/08/2016  (d) the January 2018 tumor sample was tested for the estrogen receptor and he was 80 percent positive with strong staining, progesterone receptor negative  (3) carboplatin/liposomal doxorubicin started 03/05/2016, repeated every three weeks x4, completed 06/04/2016.  (a) cycle 3 delayed one week because of neutropenia; OnPro added  (b) CA 125 normalized after cycle 3  (4) RUCAPARIB maintenance started 07/02/2016  (a) restaging 10/01/2016: Normal CA 125, negative CT of the abdomen and pelvis  (b) labs 11/24/2016 shows a rise in her CA 125-43.4, with continuing rise thereafter  (c) rucaparib discontinued October 2018 with rising CA 125  (5) anastrozole started December 23, 2016-stopped after a couple of weeks with poor tolerance  SECOND RECURRENCE: (6) Gemcitabine/Bevacizumab started on 02/04/2017  (a) gemcitabine omitted cycle 4 because of intercurrent infection  (b) gemcitabine resumed with cycle 5, with intervening rise in  Ca 125  (c) repeat CT scan of the abdomen and pelvis on 05/26/2017 does not confirm obvious disease progression  (d) cycle 5 of gemcitabine/bevacizumab delayed 1 week   (e) gemcitabine/ bevacizumab discontinued after 6 cycles, with rising CA 125 (last dose 06/24/2017)  (7) foundation one testing did not show a high mutation burden and the microsatellite status was stable.  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  These were not immediately  actionable  (8) Abraxane started 07/12/2017, given weekly or weeks 1 and 2 of each 3 weeks cycle, last dose 11/15/2017 (5 cycles)  (a) cycle 2 started 08/09/2017, will be day 1 day 8 only  (b) cycle 3 scheduled to start 09/13/2017, day 1 day 8 at patient request  (c) CT of the abdomen and pelvis 11/28/2017 shows no measurable disease  (d) PET scan 01/25/2018 and MRI of the pelvis 02/03/2018 show no measurable disease  (e) patient has symptomatic ascites and a rapidly rising Ca1 25  (9) to start cis-platinum/gemcitabine days 1 and 8 of each 21-day cycle on 02/18/2018   PLAN: Livian is doing moderately well today.  She does have increased pain.  I sent in tramadol for her to take every 6 hours as needed.  We reviewed that she could alternate this with Ibuprofen. We discussed the possibility of imaging with KUB, ultrasound, or CT scan, however she declines at this point.  Red flags reviewed with patient.  Seeley will return on Friday for labs and f/u with Dr. Jana Hakim.  She knows to call for any questions or concerns prior to her next appointment with [name].    A total of (20) minutes of face-to-face time was spent with this patient with greater than  50% of that time in counseling and care-coordination.   Wilber Bihari, NP  03/06/18 1:47 PM Medical Oncology and Hematology [facility] [address] [place], [place] [address] Tel. [phone]    Fax. [phone]

## 2018-03-07 ENCOUNTER — Other Ambulatory Visit: Payer: Self-pay | Admitting: Radiology

## 2018-03-08 ENCOUNTER — Other Ambulatory Visit: Payer: Self-pay

## 2018-03-08 ENCOUNTER — Ambulatory Visit (HOSPITAL_COMMUNITY)
Admission: RE | Admit: 2018-03-08 | Discharge: 2018-03-08 | Disposition: A | Discharge status: Home / Self Care | Payer: 59 | Source: Ambulatory Visit | Attending: Oncology | Admitting: Oncology

## 2018-03-08 ENCOUNTER — Encounter (HOSPITAL_COMMUNITY): Payer: Self-pay

## 2018-03-08 DIAGNOSIS — C762 Malignant neoplasm of abdomen: Secondary | ICD-10-CM

## 2018-03-08 DIAGNOSIS — Z79899 Other long term (current) drug therapy: Secondary | ICD-10-CM | POA: Diagnosis not present

## 2018-03-08 DIAGNOSIS — C569 Malignant neoplasm of unspecified ovary: Secondary | ICD-10-CM | POA: Diagnosis not present

## 2018-03-08 DIAGNOSIS — Z9221 Personal history of antineoplastic chemotherapy: Secondary | ICD-10-CM | POA: Insufficient documentation

## 2018-03-08 DIAGNOSIS — C5701 Malignant neoplasm of right fallopian tube: Secondary | ICD-10-CM

## 2018-03-08 HISTORY — PX: IR IMAGING GUIDED PORT INSERTION: IMG5740

## 2018-03-08 LAB — CBC WITH DIFFERENTIAL/PLATELET
Abs Immature Granulocytes: 0 10*3/uL (ref 0.00–0.07)
Basophils Absolute: 0 10*3/uL (ref 0.0–0.1)
Basophils Relative: 0 %
Eosinophils Absolute: 0 10*3/uL (ref 0.0–0.5)
Eosinophils Relative: 0 %
HCT: 32.1 % — ABNORMAL LOW (ref 36.0–46.0)
Hemoglobin: 10.4 g/dL — ABNORMAL LOW (ref 12.0–15.0)
Immature Granulocytes: 0 %
Lymphocytes Relative: 55 %
Lymphs Abs: 1 10*3/uL (ref 0.7–4.0)
MCH: 31.2 pg (ref 26.0–34.0)
MCHC: 32.4 g/dL (ref 30.0–36.0)
MCV: 96.4 fL (ref 80.0–100.0)
Monocytes Absolute: 0.3 10*3/uL (ref 0.1–1.0)
Monocytes Relative: 15 %
Neutro Abs: 0.5 10*3/uL — ABNORMAL LOW (ref 1.7–7.7)
Neutrophils Relative %: 30 %
Platelets: 145 10*3/uL — ABNORMAL LOW (ref 150–400)
RBC: 3.33 MIL/uL — ABNORMAL LOW (ref 3.87–5.11)
RDW: 11.9 % (ref 11.5–15.5)
WBC: 1.8 10*3/uL — ABNORMAL LOW (ref 4.0–10.5)
nRBC: 0 % (ref 0.0–0.2)

## 2018-03-08 LAB — PROTIME-INR
INR: 0.99
Prothrombin Time: 13 seconds (ref 11.4–15.2)

## 2018-03-08 MED ORDER — MIDAZOLAM HCL 2 MG/2ML IJ SOLN
INTRAMUSCULAR | Status: AC
  Filled 2018-03-08: qty 4

## 2018-03-08 MED ORDER — CEFAZOLIN SODIUM-DEXTROSE 2-4 GM/100ML-% IV SOLN
2.0000 g | INTRAVENOUS | Status: AC
  Administered 2018-03-08: 2 g via INTRAVENOUS

## 2018-03-08 MED ORDER — LIDOCAINE HCL 1 % IJ SOLN
INTRAMUSCULAR | Status: AC | PRN
  Administered 2018-03-08: 5 mL

## 2018-03-08 MED ORDER — FENTANYL CITRATE (PF) 100 MCG/2ML IJ SOLN
INTRAMUSCULAR | Status: AC | PRN
  Administered 2018-03-08 (×2): 50 ug via INTRAVENOUS

## 2018-03-08 MED ORDER — FENTANYL CITRATE (PF) 100 MCG/2ML IJ SOLN
INTRAMUSCULAR | Status: AC
  Filled 2018-03-08: qty 2

## 2018-03-08 MED ORDER — HEPARIN SOD (PORK) LOCK FLUSH 100 UNIT/ML IV SOLN
INTRAVENOUS | Status: AC
  Filled 2018-03-08: qty 5

## 2018-03-08 MED ORDER — CEFAZOLIN SODIUM-DEXTROSE 2-4 GM/100ML-% IV SOLN
INTRAVENOUS | Status: AC
  Administered 2018-03-08: 2 g via INTRAVENOUS
  Filled 2018-03-08: qty 100

## 2018-03-08 MED ORDER — SODIUM CHLORIDE 0.9 % IV SOLN
INTRAVENOUS | Status: DC
  Administered 2018-03-08: 14:00:00 via INTRAVENOUS

## 2018-03-08 MED ORDER — LIDOCAINE HCL 1 % IJ SOLN
INTRAMUSCULAR | Status: AC | PRN
  Administered 2018-03-08: 10 mL

## 2018-03-08 MED ORDER — MIDAZOLAM HCL 2 MG/2ML IJ SOLN
INTRAMUSCULAR | Status: AC | PRN
  Administered 2018-03-08 (×2): 1 mg via INTRAVENOUS

## 2018-03-08 MED ORDER — LIDOCAINE-EPINEPHRINE (PF) 2 %-1:200000 IJ SOLN
INTRAMUSCULAR | Status: AC
  Filled 2018-03-08: qty 20

## 2018-03-08 NOTE — Consult Note (Signed)
Chief Complaint: History of ovarian cancer, poor venous access  Referring Physician(s): Magrinat,Gustav C  Patient Status: [facility] - Out-pt  History of Present Illness: Kathryn Ramsey is a 53 y.o. female with past medical history significant for nephrolithiasis, asthma and recent diagnosis of ovarian cancer, who presents today for image guided portacatheter placement.  Patient is accompanied by her family and friends though serves as her own historian.  Patient has undergone several rounds of chemotherapy though has tolerated the regimen thus far fairly well.    She is currently without complaint.  Specifically, no chest pain, shortness of breath, fever or chills.  Past Medical History:  Diagnosis Date  . Acute sensory neuropathy 06/26/2015  . Allergy   . Anemia   . Asthma    illness induced asthma  . Blood transfusion without reported diagnosis    at age 40 years old D/T surgery to femur being crushed  . 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 Heart murmur    pt, states had a "working heart murmur"  . History of kidney stones   . PONV (postoperative nausea and vomiting)   . Skin cancer     Past Surgical History:  Procedure Laterality Date  . 3 laporscopic proceedures    . ABDOMINAL HYSTERECTOMY     2010  . CHOLECYSTECTOMY     2010  . DILATION AND CURETTAGE OF UTERUS     1997  . LAPAROSCOPY N/A 03/27/2015   Procedure: DIAGNOSTIC LAPAROSCOPY ;  Surgeon: [name], MD;  Location: [facility];  Service: Gynecology;  Laterality: N/A;  . LAPAROSCOPY N/A 02/24/2016   Procedure: LAPAROSCOPY DIAGNOSTIC WITH PERITONEAL WASHINGS AND PERITONEAL BIOPSY;  Surgeon: [name], MD;  Location: [facility];  Service: Gynecology;  Laterality: N/A;  . LAPAROTOMY N/A 03/27/2015   Procedure: EXPLORATORY LAPAROTOMY, BILATERAL SALPINGO OOPHORECTOMY, OMENTECTOMY, RADICAL TUMOR DEBULKING;  Surgeon: [name], MD;  Location: [facility];   Service: Gynecology;  Laterality: N/A;  . sinus surgery 1994      Allergies: Bee venom; Compazine [prochlorperazine edisylate]; and Sulfa antibiotics  Medications: Prior to Admission medications   Medication Sig Start Date End Date Taking? Authorizing Provider  dexamethasone (DECADRON) 4 MG tablet Take 2 tablets by mouth once a day on the day after cisplatin chemotherapy and then take 2 tablets two times a day for 2 days. Take with food. 02/17/18  Yes Magrinat, Virgie Dad, MD  gabapentin (NEURONTIN) 100 MG capsule Take 1 capsule (100 mg total) by mouth 3 (three) times daily. 12/06/17  Yes [name], MD  magnesium oxide (MAG-OX) 400 (241.3 Mg) MG tablet Take 1 tablet (400 mg total) by mouth daily. 02/17/18  Yes Magrinat, Virgie Dad, MD  omeprazole (PRILOSEC) 40 MG capsule Take 1 capsule (40 mg total) by mouth daily. 03/06/18  Yes Causey, Charlestine Massed, NP  ondansetron (ZOFRAN) 8 MG tablet Take 1 tablet (8 mg total) by mouth 2 (two) times daily as needed. Start on the third day after cisplatin chemotherapy. 02/17/18  Yes Magrinat, Virgie Dad, MD  sertraline (ZOLOFT) 50 MG tablet Take 50 mg by mouth daily. 02/10/18  Yes [provider]  traMADol (ULTRAM) 50 MG tablet Take 1 tablet (50 mg total) by mouth every 6 (six) hours as needed. 03/06/18  Yes Causey, Charlestine Massed, NP  diphenhydrAMINE (BENADRYL) 50 MG capsule 50 mg Diphenhydramine (Benadryl) PO within 1 hour of the injection 11/28/17 11/28/17  Gardenia Phlegm, NP  fluticasone (FLONASE) 50 MCG/ACT nasal spray Place 1 spray into both nostrils 2 (two) times daily. Patient taking differently: Place 1 spray into both nostrils as needed.  08/16/17   Tanner, Lyndon Code., PA-C  lidocaine-prilocaine (EMLA) cream Apply 1 application topically as needed. 02/23/18   Magrinat, Virgie Dad, MD  loratadine (CLARITIN) 10 MG tablet Take 10 mg by mouth as needed.     [provider]  prochlorperazine (COMPAZINE) 10 MG tablet Take 1 tablet (10 mg  total) by mouth every 6 (six) hours as needed (Nausea or vomiting). Patient not taking: Reported on 03/06/2018 02/17/18   Magrinat, Virgie Dad, MD     Family History  Problem Relation Age of Onset  . Hypertension Father   . Skin cancer Father        nonmelanoma skin cancers in his late 24s  . Non-Hodgkin's lymphoma Maternal Aunt        dx. 84s; smoker  . Stroke Maternal Grandmother   . Other Mother        benign meningioma dx. early-mid-70s; hysterectomy in her late 37s for heavy periods - still has ovaries  . Other Son        one son with pre-cancerous skin findings  . Other Daughter        cysts on ovaries and hx of heavy periods  . Other Sister        hysterectomy for cysts - still has ovaries  . Heart attack Maternal Grandfather   . Heart attack Paternal Grandfather   . Renal cancer Maternal Aunt 60       smoker  . Breast cancer Maternal Aunt 78  . Other Maternal Aunt        dx. benign brain tumor (meningioma) at 70; 2nd benign brain tumor in her late 91s; hx of radical hysterectomy at age 54  . Brain cancer Other        NOS tumor    Social History   Socioeconomic History  . Marital status: Married    Spouse name: Not on file  . Number of children: 4  . Years of education: 59  . Highest education level: Not on file  Occupational History  . Occupation: Biomedical scientist  Social Needs  . Financial resource strain: Not on file  . Food insecurity:    Worry: Not on file    Inability: Not on file  . Transportation needs:    Medical: Not on file    Non-medical: Not on file  Tobacco Use  . Smoking status: Never Smoker  . Smokeless tobacco: Never Used  Substance and Sexual Activity  . Alcohol use: Yes    Comment:  3 glasses wine or beer/week  . Drug use: No  . Sexual activity: Not on file  Lifestyle  . Physical activity:    Days per week: Not on file    Minutes per session: Not on file  . Stress: Not on file  Relationships  . Social connections:    Talks on phone: Not  on file    Gets together: Not on file    Attends religious service: Not on file    Active member of club or organization: Not on file    Attends meetings of clubs or organizations: Not on file    Relationship status: Not on file  Other Topics Concern  . Not on file  Social History Narrative   Lives at home w/ husband and children   Right-handed  3 cups caffeine per day    ECOG Status: 1 - Symptomatic but completely ambulatory  Review of Systems: A 12 point ROS discussed and pertinent positives are indicated in the HPI above.  All other systems are negative.  Review of Systems  Constitutional: Negative for activity change, appetite change, fatigue and fever.  Respiratory: Negative.   Cardiovascular: Negative.   Gastrointestinal: Negative.     Vital Signs: There were no vitals taken for this visit.  Physical Exam Vitals signs and nursing note reviewed.  Constitutional:      Appearance: Normal appearance.  Cardiovascular:     Rate and Rhythm: Regular rhythm.  Pulmonary:     Effort: Pulmonary effort is normal.  Psychiatric:        Mood and Affect: Mood normal.        Behavior: Behavior normal.        Thought Content: Thought content normal.        Judgment: Judgment normal.     Imaging: Ir Abdomen US Limited  Result Date: 02/20/2018 CLINICAL DATA:  Abdominal carcinomatosis.  Paracentesis requested. EXAM: LIMITED ABDOMEN ULTRASOUND FOR ASCITES TECHNIQUE: Limited ultrasound survey for ascites was performed in all four abdominal quadrants. COMPARISON:  MR 04/06/2017 and previous FINDINGS: There is a small amount of scattered ascites. No large pocket for safe therapeutic paracentesis. IMPRESSION: Small volume ascites.  Paracentesis deferred. Electronically Signed   By: Lucrezia Europe M.D.   On: 02/20/2018 15:20    Labs:  CBC: Recent Labs    11/28/17 0832 01/16/18 1240 02/17/18 0930 02/23/18 0806  WBC 7.5 3.9* 4.7 4.3  HGB 11.5* 13.1 12.8 14.2  HCT 36.6 40.5 39.9 43.1   PLT 167 217 305 317    COAGS: Recent Labs    03/08/18 1338  INR 0.99    BMP: Recent Labs    11/28/17 0832 01/16/18 1240 02/17/18 0930 02/23/18 0806  NA 140 140 141 139  K 4.5 4.4 4.9 4.4  CL 105 104 104 99  CO2 28 27 28 28   GLUCOSE 99 95 96 91  BUN 11 9 6 17   CALCIUM 9.8 9.6 9.6 9.5  CREATININE 0.66 0.79 0.68 0.76  GFRNONAA >60 >60 >60 >60  GFRAA >60 >60 >60 >60    LIVER FUNCTION TESTS: Recent Labs    11/28/17 0832 01/16/18 1240 02/17/18 0930 02/23/18 0806  BILITOT 0.5 0.5 0.4 0.5  AST 18 25 36 90*  ALT 17 19 32 163*  ALKPHOS 78 60 67 76  PROT 7.7 7.8 7.2 7.6  ALBUMIN 4.7 4.5 3.9 3.9    TUMOR MARKERS: No results for input(s): AFPTM, CEA, CA199, CHROMGRNA in the last 8760 hours.  Assessment and Plan:  Kathryn Ramsey is a 53 y.o. female with past medical history significant for nephrolithiasis, asthma and recent diagnosis of ovarian cancer, who presents today for image guided portacatheter placement.  Patient is accompanied by her family and friends though serves as her own historian.  She is currently without complaint.  Risks and benefits of image guided port-a-catheter placement was discussed with the patient including, but not limited to bleeding, infection, pneumothorax, or fibrin sheath development and need for additional procedures.  All of the patient's questions were answered, patient is agreeable to proceed.  Consent signed and in chart.  Thank you for this interesting consult.  I greatly enjoyed meeting Kathryn Ramsey and look forward to participating in their care.  A copy of this report was sent to the requesting provider on this date.  Electronically Signed: Sandi Mariscal, MD 03/08/2018, 2:29 PM   I spent a total of 15 Minutes in face to face in clinical consultation, greater than 50% of which was counseling/coordinating care for port placement.

## 2018-03-08 NOTE — Procedures (Signed)
Pre Procedure Dx: Ovarian Cancer Post Procedural Dx: Same  Successful placement of right IJ approach port-a-cath with tip at the superior caval atrial junction. The catheter is ready for immediate use.  Estimated Blood Loss: Minimal  Complications: None immediate.  Jay Ivah Girardot, MD Pager #: [phone]   

## 2018-03-08 NOTE — Discharge Instructions (Signed)
Do not use EMLA cream until port has healed. It can dissolve the skin glue.     Implanted [facility] Guide An implanted port is a device that is placed under the skin. It is usually placed in the chest. The device can be used to give IV medicine, to take blood, or for dialysis. You may have an implanted port if:  You need IV medicine that would be irritating to the small veins in your hands or arms.  You need IV medicines, such as antibiotics, for a long period of time.  You need IV nutrition for a long period of time.  You need dialysis. Having a port means that your health care provider will not need to use the veins in your arms for these procedures. You may have fewer limitations when using a port than you would if you used other types of long-term IVs, and you will likely be able to return to normal activities after your incision heals. An implanted port has two main parts:  Reservoir. The reservoir is the part where a needle is inserted to give medicines or draw blood. The reservoir is round. After it is placed, it appears as a small, raised area under your skin.  Catheter. The catheter is a thin, flexible tube that connects the reservoir to a vein. Medicine that is inserted into the reservoir goes into the catheter and then into the vein. How is my port accessed? To access your port:  A numbing cream may be placed on the skin over the port site.  Your health care provider will put on a mask and sterile gloves.  The skin over your port will be cleaned carefully with a germ-killing soap and allowed to dry.  Your health care provider will gently pinch the port and insert a needle into it.  Your health care provider will check for a blood return to make sure the port is in the vein and is not clogged.  If your port needs to remain accessed to get medicine continuously (constant infusion), your health care provider will place a clear bandage (dressing) over the needle site. The  dressing and needle will need to be changed every week, or as told by your health care provider. What is flushing? Flushing helps keep the port from getting clogged. Follow instructions from your health care provider about how and when to flush the port. Ports are usually flushed with saline solution or a medicine called heparin. The need for flushing will depend on how the port is used:  If the port is only used from time to time to give medicines or draw blood, the port may need to be flushed: ? Before and after medicines have been given. ? Before and after blood has been drawn. ? As part of routine maintenance. Flushing may be recommended every 4-6 weeks.  If a constant infusion is running, the port may not need to be flushed.  Throw away any syringes in a disposal container that is meant for sharp items (sharps container). You can buy a sharps container from a pharmacy, or you can make one by using an empty hard plastic bottle with a cover. How long will my port stay implanted? The port can stay in for as long as your health care provider thinks it is needed. When it is time for the port to come out, a surgery will be done to remove it. The surgery will be similar to the procedure that was done to put the  port in. Follow these instructions at home:   Flush your port as told by your health care provider.  If you need an infusion over several days, follow instructions from your health care provider about how to take care of your port site. Make sure you: ? Wash your hands with soap and water before you change your dressing. If soap and water are not available, use alcohol-based hand sanitizer. ? Change your dressing as told by your health care provider. ? Place any used dressings or infusion bags into a plastic bag. Throw that bag in the trash. ? Keep the dressing that covers the needle clean and dry. Do not get it wet. ? Do not use scissors or sharp objects near the tube. ? Keep the tube  clamped, unless it is being used.  Check your port site every day for signs of infection. Check for: ? Redness, swelling, or pain. ? Fluid or blood. ? Pus or a bad smell.  Protect the skin around the port site. ? Avoid wearing bra straps that rub or irritate the site. ? Protect the skin around your port from seat belts. Place a soft pad over your chest if needed.  Bathe or shower as told by your health care provider. The site may get wet as long as you are not actively receiving an infusion.  Return to your normal activities as told by your health care provider. Ask your health care provider what activities are safe for you.  Carry a medical alert card or wear a medical alert bracelet at all times. This will let health care providers know that you have an implanted port in case of an emergency. Get help right away if:  You have redness, swelling, or pain at the port site.  You have fluid or blood coming from your port site.  You have pus or a bad smell coming from the port site.  You have a fever. Summary  Implanted ports are usually placed in the chest for long-term IV access.  Follow instructions from your health care provider about flushing the port and changing bandages (dressings).  Take care of the area around your port by avoiding clothing that puts pressure on the area, and by watching for signs of infection.  Protect the skin around your port from seat belts. Place a soft pad over your chest if needed.  Get help right away if you have a fever or you have redness, swelling, pain, drainage, or a bad smell at the port site. This information is not intended to replace advice given to you by your health care provider. Make sure you discuss any questions you have with your health care provider. Document Released: 02/01/2005 Document Revised: 03/06/2016 Document Reviewed: 03/06/2016 Elsevier Interactive Patient Education  2019 [place].     Moderate Conscious  Sedation, Adult, Care After These instructions provide you with information about caring for yourself after your procedure. Your health care provider may also give you more specific instructions. Your treatment has been planned according to current medical practices, but problems sometimes occur. Call your health care provider if you have any problems or questions after your procedure. What can I expect after the procedure? After your procedure, it is common:  To feel sleepy for several hours.  To feel clumsy and have poor balance for several hours.  To have poor judgment for several hours.  To vomit if you eat too soon. Follow these instructions at home: For at least 24 hours after the  procedure:   Do not: ? Participate in activities where you could fall or become injured. ? Drive. ? Use heavy machinery. ? Drink alcohol. ? Take sleeping pills or medicines that cause drowsiness. ? Make important decisions or sign legal documents. ? Take care of children on your own.  Rest. Eating and drinking  Follow the diet recommended by your health care provider.  If you vomit: ? Drink water, juice, or soup when you can drink without vomiting. ? Make sure you have little or no nausea before eating solid foods. General instructions  Have a responsible adult stay with you until you are awake and alert.  Take over-the-counter and prescription medicines only as told by your health care provider.  If you smoke, do not smoke without supervision.  Keep all follow-up visits as told by your health care provider. This is important. Contact a health care provider if:  You keep feeling nauseous or you keep vomiting.  You feel light-headed.  You develop a rash.  You have a fever. Get help right away if:  You have trouble breathing. This information is not intended to replace advice given to you by your health care provider. Make sure you discuss any questions you have with your health  care provider. Document Released: 11/22/2012 Document Revised: 07/07/2015 Document Reviewed: 05/24/2015 Elsevier Interactive Patient Education  2019 [employer].

## 2018-03-09 ENCOUNTER — Inpatient Hospital Stay: Payer: 59

## 2018-03-09 ENCOUNTER — Encounter: Payer: Self-pay | Admitting: Oncology

## 2018-03-09 DIAGNOSIS — C5701 Malignant neoplasm of right fallopian tube: Secondary | ICD-10-CM

## 2018-03-09 DIAGNOSIS — D701 Agranulocytosis secondary to cancer chemotherapy: Secondary | ICD-10-CM

## 2018-03-09 DIAGNOSIS — T451X5A Adverse effect of antineoplastic and immunosuppressive drugs, initial encounter: Secondary | ICD-10-CM

## 2018-03-09 DIAGNOSIS — Z5189 Encounter for other specified aftercare: Secondary | ICD-10-CM | POA: Diagnosis not present

## 2018-03-09 DIAGNOSIS — G620 Drug-induced polyneuropathy: Secondary | ICD-10-CM

## 2018-03-09 DIAGNOSIS — C762 Malignant neoplasm of abdomen: Secondary | ICD-10-CM

## 2018-03-09 LAB — CBC WITH DIFFERENTIAL/PLATELET
Abs Immature Granulocytes: 0.03 10*3/uL (ref 0.00–0.07)
Basophils Absolute: 0 10*3/uL (ref 0.0–0.1)
Basophils Relative: 0 %
Eosinophils Absolute: 0 10*3/uL (ref 0.0–0.5)
Eosinophils Relative: 0 %
HCT: 34.9 % — ABNORMAL LOW (ref 36.0–46.0)
Hemoglobin: 11.4 g/dL — ABNORMAL LOW (ref 12.0–15.0)
Immature Granulocytes: 2 %
Lymphocytes Relative: 58 %
Lymphs Abs: 0.9 10*3/uL (ref 0.7–4.0)
MCH: 30.6 pg (ref 26.0–34.0)
MCHC: 32.7 g/dL (ref 30.0–36.0)
MCV: 93.8 fL (ref 80.0–100.0)
Monocytes Absolute: 0.3 10*3/uL (ref 0.1–1.0)
Monocytes Relative: 17 %
Neutro Abs: 0.3 10*3/uL — CL (ref 1.7–7.7)
Neutrophils Relative %: 23 %
Platelets: 203 10*3/uL (ref 150–400)
RBC: 3.72 MIL/uL — ABNORMAL LOW (ref 3.87–5.11)
RDW: 11.9 % (ref 11.5–15.5)
WBC: 1.5 10*3/uL — ABNORMAL LOW (ref 4.0–10.5)
nRBC: 0 % (ref 0.0–0.2)

## 2018-03-09 LAB — COMPREHENSIVE METABOLIC PANEL
ALT: 18 U/L (ref 0–44)
AST: 16 U/L (ref 15–41)
Albumin: 3.6 g/dL (ref 3.5–5.0)
Alkaline Phosphatase: 88 U/L (ref 38–126)
Anion gap: 12 (ref 5–15)
BUN: 6 mg/dL (ref 6–20)
CO2: 26 mmol/L (ref 22–32)
Calcium: 9.5 mg/dL (ref 8.9–10.3)
Chloride: 101 mmol/L (ref 98–111)
Creatinine, Ser: 0.64 mg/dL (ref 0.44–1.00)
GFR calc Af Amer: >60 mL/min (ref >60)
GFR calc non Af Amer: >60 mL/min (ref >60)
Glucose, Bld: 97 mg/dL (ref 70–99)
Potassium: 3.9 mmol/L (ref 3.5–5.1)
Sodium: 139 mmol/L (ref 135–145)
Total Bilirubin: 0.2 mg/dL — ABNORMAL LOW (ref 0.3–1.2)
Total Protein: 7.8 g/dL (ref 6.5–8.1)

## 2018-03-09 NOTE — Progress Notes (Signed)
[facility]  [phone] [phone]     ID: Kathryn Ramsey DOB: 22-Apr-1965  MR#: [id number]  GXQ#:[id number]  Patient Care Team: Harlan Stains, MD as PCP - General (Family Medicine) [name], MD as Consulting Physician (Obstetrics and Gynecology) Juanita Craver, MD as Consulting Physician (Gastroenterology) Servando Salina, MD as Consulting Physician (Obstetrics and Gynecology) Nickie Retort, MD as Consulting Physician (Urology) Gillis Ends, MD as Referring Physician (Obstetrics and Gynecology) OTHER MD: Festus Aloe [phone])   CHIEF COMPLAINT:  ovarian cancer   CURRENT TREATMENT: Cisplatin, gemcitabine   INTERVAL HISTORY: Kathryn Ramsey returns today for follow-up and treatment of her recurrent ovarian cancer. She is accompanied by her husband.  She continues on cisplatin/gemcitabine. She will receive her next dose on 03/13/2018.  Since her last visit here, she has not undergone any additional studies.   Per patient message on 03/09/2018, Kathryn Ramsey is interested in receiving a second opinion from Dr. Virgilio Belling at [facility] because she could potentially participate in a "cutting edge trial that does not require measurable disease to qualify" that she heard about from a friend that is a Freight forwarder.   Results for Kathryn Ramsey (MRN [id number]) as of 03/10/2018 09:08  Ref. Range 11/07/2017 08:50 11/28/2017 08:32 01/16/2018 12:40 02/17/2018 09:30 03/09/2018 08:24  CA 27.29 Latest Ref Range: 0.0 - 38.6 U/mL   27.4    Cancer Antigen (CA) 125 Latest Ref Range: 0.0 - 38.1 U/mL 38.9 (H) 48.1 (H) 1,372.0 (H) 4,173.0 (H) 3,218.0 (H)   She had a port inserted by Dr. Pascal Lux on 03/08/2018 and it went very well.  She "hates that she didn't get it done sooner.  "   REVIEW OF SYSTEMS: Kathryn Ramsey is experiencing variable, occasionally significant pain. When she stretches, walks, or takes a deep breath, she feels pain. She took tramadol for pain,  which made her tired, but she didn't see any difference in pain than in Tylenol plus ibuprofen.She has some mild constipation; she has had a bowel movement about every day, but they are somewhat hard.  She believes that her sensation to urinate is changed. She feels the weight of her bladder rather than the urgency to go. The patient denies unusual headaches, visual changes, nausea, vomiting, or dizziness. There has been no unusual cough, phlegm production, or pleurisy. The patient denies unexplained weight loss, bleeding, rash, or fever. A detailed review of systems was otherwise noncontributory.    HISTORY OF PRESENT ILLNESS: From Dr. Serita Grit original intake note 03/17/2015:  "Kathryn Ramsey is a very pleasant G4P4 who is seen in consultation at the request of Dr Collene Mares and Dr Garwin Brothers for peritoneal carcinomatosis. The patient has a history of a workup by urology for gross hematuria which included a pelvic examine was suggested for extrinsic mass effect on the bladder on cystoscopy. Cystoscopy took place on in December 2016. The patient denies abdominal pain bloating early satiety or abdominal distention.  On 08/03/2015 at [facility] she underwent a CT scan of the abdomen and pelvis as ordered by Dr Baruch Gouty. This revealed a 1.4 cm low attenuation lesion on the posterior right hepatic lobe suggestive of a hemangioma. Status post hysterectomy. No ovarian masses. Moderate ascites. Omental caking seen in the lateral left abdomen and pelvis. Peritoneal nodularity in the left lateral pelvic cul-de-sac. No gross extrinsic compression on the bladder was identified on imaging.  The patient was then seen by her gastroenterologist, Dr. Collene Mares, who performed a colonoscopy which was unremarkable.  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 She was treated adjuvantly with carboplatin and paclitaxel, as detailed below. Her CA 125 dropped from 1226 on 03/20/2015 to 26.1 by 06/16/2015.  Her subsequent history is as detailed below.  I PAST MEDICAL HISTORY: Past Medical History:  Diagnosis Date  . Acute sensory neuropathy 06/26/2015  . Allergy   . Anemia   . Asthma    illness induced asthma  . Blood transfusion without reported diagnosis    at age 89 years old D/T surgery to femur being crushed  . 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 Heart murmur    pt, states had a "working heart murmur"  . History of kidney stones   . Neutropenia, drug-induced ([place]) 03/10/2018  . PONV (postoperative nausea and vomiting)   . Skin cancer     PAST SURGICAL HISTORY: Past Surgical History:  Procedure Laterality Date  . 3 laporscopic proceedures    . ABDOMINAL HYSTERECTOMY     2010  . CHOLECYSTECTOMY     2010  . DILATION AND CURETTAGE OF UTERUS     1997  . IR IMAGING GUIDED PORT INSERTION  03/08/2018  . LAPAROSCOPY N/A 03/27/2015   Procedure: DIAGNOSTIC LAPAROSCOPY ;  Surgeon: [name], MD;  Location: [facility];  Service: Gynecology;  Laterality: N/A;  . LAPAROSCOPY N/A 02/24/2016   Procedure: LAPAROSCOPY DIAGNOSTIC WITH PERITONEAL WASHINGS AND PERITONEAL BIOPSY;  Surgeon: [name], MD;  Location: [facility];  Service: Gynecology;  Laterality: N/A;  . LAPAROTOMY N/A 03/27/2015   Procedure: EXPLORATORY LAPAROTOMY, BILATERAL SALPINGO OOPHORECTOMY, OMENTECTOMY, RADICAL TUMOR DEBULKING;  Surgeon: [name], MD;  Location: [facility];  Service: Gynecology;  Laterality: N/A;  . sinus surgery 1994      FAMILY HISTORY Family History  Problem Relation Age of Onset  . Hypertension Father   . Skin cancer  Father        nonmelanoma skin cancers in his late 70s  . Non-Hodgkin's lymphoma Maternal Aunt        dx. 43s; smoker  . Stroke Maternal Grandmother   . Other Mother        benign meningioma dx. early-mid-70s; hysterectomy in her late 8s for heavy periods - still has ovaries  . Other Son        one son with pre-cancerous skin findings  . Other Daughter        cysts on ovaries and hx of heavy periods  . Other Sister        hysterectomy for cysts - still has ovaries  . Heart attack Maternal Grandfather   . Heart attack Paternal Grandfather   . Renal cancer Maternal Aunt 60       smoker  . Breast cancer Maternal Aunt 78  . Other Maternal Aunt        dx. benign brain tumor (meningioma) at 66; 2nd benign brain tumor in her late 49s; hx of radical hysterectomy at age 46  . Brain cancer Other        NOS tumor  The patient's parents are both living, in their late 62s as of January 2018.  The patient has one brother, 3 sisters. There is no history of ovarian cancer in the family. One maternal aunt had breast and kidney cancer, another had non-Hodgkin's lymphoma.  GYNECOLOGIC HISTORY:  No LMP recorded. Patient has had a hysterectomy. 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 Her husband [name] is a Engineer, maintenance (IT). Their children are 4, 44, 45 and 37 y/o as of JAN 2018. The oldest works as an Electrical engineer in the [employer] in [place]. The other 3 children live in [place], the youngest currently attending [employer]. The patient has no grandchildren. She attends [employer]     ADVANCED DIRECTIVES:    HEALTH MAINTENANCE: Social History   Tobacco Use  . Smoking status: Never Smoker  . Smokeless tobacco: Never Used  Substance Use Topics  . Alcohol use: Yes    Comment:  3 glasses wine or beer/week  . Drug use: No     Colonoscopy:2016  PAP: s/p hyst  Bone density:   Allergies    Allergen Reactions  . Bee Venom Shortness Of Breath    swelling  . Compazine [Prochlorperazine Edisylate] Anaphylaxis, Anxiety and Palpitations  . Sulfa Antibiotics Shortness Of Breath    Vomit , diarrhea , hives    Current Outpatient Medications  Medication Sig Dispense Refill  . dexamethasone (DECADRON) 4 MG tablet Take 2 tablets by mouth once a day on the day after cisplatin chemotherapy and then take 2 tablets two times a day for 2 days. Take with food. 30 tablet 1  . diphenhydrAMINE (BENADRYL) 50 MG capsule 50 mg Diphenhydramine (Benadryl) PO within 1 hour of the injection 1 capsule 0  . fluticasone (FLONASE) 50 MCG/ACT nasal spray Place 1 spray into both nostrils 2 (two) times daily. (Patient taking differently: Place 1 spray into both nostrils as needed. ) 16 g 2  . gabapentin (NEURONTIN) 100 MG capsule Take 1 capsule (100 mg total) by mouth 3 (three) times daily. 270 capsule 3  . lidocaine-prilocaine (EMLA) cream Apply 1 application topically as needed. 30 g 0  . loratadine (CLARITIN) 10 MG tablet Take 10 mg by mouth as needed.     . magnesium oxide (MAG-OX) 400 (241.3 Mg) MG tablet Take 1 tablet (400 mg total) by mouth daily. 60 tablet 3  . omeprazole (PRILOSEC) 40 MG capsule Take 1 capsule (40 mg total) by mouth daily. 30 capsule 5  . ondansetron (ZOFRAN) 8 MG tablet Take 1 tablet (8 mg total) by mouth 2 (two) times daily as needed. Start on the third day after cisplatin chemotherapy. 30 tablet 1  . prochlorperazine (COMPAZINE) 10 MG tablet Take 1 tablet (10 mg total) by mouth every 6 (six) hours as needed (Nausea or vomiting). (Patient not taking: Reported on 03/06/2018) 30 tablet 1  . sertraline (ZOLOFT) 50 MG tablet Take 50 mg by mouth daily.    . traMADol (ULTRAM) 50 MG tablet Take 1 tablet (50 mg total) by mouth every 6 (six) hours as needed. 30 tablet 0   No current facility-administered medications for this visit.     OBJECTIVE: Middle-aged white woman who appears younger  than stated age  26:   03/10/18 0908  BP: 112/66  Pulse: 68  Resp: 16  Temp: 97.8 F (36.6 C)  SpO2: 100%     Body mass index is 21.93 kg/m.   ECOG FS:1 - Symptomatic but completely ambulatory  Sclerae  unicteric, EOMs intact Oropharynx clear and moist No cervical or supraclavicular adenopathy Lungs no rales or rhonchi Heart regular rate and rhythm Abd soft, nontender, positive bowel sounds, no palpable masses at the port sites MSK no focal spinal tenderness, no upper extremity lymphedema Neuro: nonfocal, well oriented, appropriate affect Breasts: Deferred    LAB RESULTS:  CMP     Component Value Date/Time   NA 139 03/09/2018 0824   NA 140 02/11/2017 0755   K 3.9 03/09/2018 0824   K 4.4 02/11/2017 0755   CL 101 03/09/2018 0824   CO2 26 03/09/2018 0824   CO2 26 02/11/2017 0755   GLUCOSE 97 03/09/2018 0824   GLUCOSE 93 02/11/2017 0755   BUN 6 03/09/2018 0824   BUN 7.9 02/11/2017 0755   CREATININE 0.64 03/09/2018 0824   CREATININE 0.79 01/16/2018 1240   CREATININE 0.7 02/11/2017 0755   CALCIUM 9.5 03/09/2018 0824   CALCIUM 9.6 02/11/2017 0755   PROT 7.8 03/09/2018 0824   PROT 7.5 02/11/2017 0755   ALBUMIN 3.6 03/09/2018 0824   ALBUMIN 4.4 02/11/2017 0755   AST 16 03/09/2018 0824   AST 25 01/16/2018 1240   AST 27 02/11/2017 0755   ALT 18 03/09/2018 0824   ALT 19 01/16/2018 1240   ALT 24 02/11/2017 0755   ALKPHOS 88 03/09/2018 0824   ALKPHOS 58 02/11/2017 0755   BILITOT 0.2 (L) 03/09/2018 0824   BILITOT 0.5 01/16/2018 1240   BILITOT 0.43 02/11/2017 0755   GFRNONAA >60 03/09/2018 0824   GFRNONAA >60 01/16/2018 1240   GFRAA >60 03/09/2018 0824   GFRAA >60 01/16/2018 1240    INo results found for: SPEP, UPEP  Lab Results  Component Value Date   WBC 1.5 (L) 03/09/2018   NEUTROABS 0.3 (LL) 03/09/2018   HGB 11.4 (L) 03/09/2018   HCT 34.9 (L) 03/09/2018   MCV 93.8 03/09/2018   PLT 203 03/09/2018      Chemistry      Component Value Date/Time    NA 139 03/09/2018 0824   NA 140 02/11/2017 0755   K 3.9 03/09/2018 0824   K 4.4 02/11/2017 0755   CL 101 03/09/2018 0824   CO2 26 03/09/2018 0824   CO2 26 02/11/2017 0755   BUN 6 03/09/2018 0824   BUN 7.9 02/11/2017 0755   CREATININE 0.64 03/09/2018 0824   CREATININE 0.79 01/16/2018 1240   CREATININE 0.7 02/11/2017 0755      Component Value Date/Time   CALCIUM 9.5 03/09/2018 0824   CALCIUM 9.6 02/11/2017 0755   ALKPHOS 88 03/09/2018 0824   ALKPHOS 58 02/11/2017 0755   AST 16 03/09/2018 0824   AST 25 01/16/2018 1240   AST 27 02/11/2017 0755   ALT 18 03/09/2018 0824   ALT 19 01/16/2018 1240   ALT 24 02/11/2017 0755   BILITOT 0.2 (L) 03/09/2018 0824   BILITOT 0.5 01/16/2018 1240   BILITOT 0.43 02/11/2017 0755       No results found for: LABCA2  No components found for: LABCA125  Recent Labs  Lab 03/08/18 1338  INR 0.99    Urinalysis    Component Value Date/Time   COLORURINE STRAW (A) 11/22/2017 0940   APPEARANCEUR CLEAR 11/22/2017 0940   LABSPEC 1.002 (L) 11/22/2017 0940   LABSPEC 1.005 10/09/2015 1239   PHURINE 7.0 11/22/2017 0940   GLUCOSEU NEGATIVE 11/22/2017 0940   GLUCOSEU Negative 10/09/2015 1239   HGBUR NEGATIVE 11/22/2017 0940   BILIRUBINUR NEGATIVE 11/22/2017 0940   BILIRUBINUR Negative 10/09/2015 1239  KETONESUR NEGATIVE 11/22/2017 0940   PROTEINUR NEGATIVE 11/22/2017 0940   UROBILINOGEN 0.2 10/09/2015 1239   NITRITE NEGATIVE 11/22/2017 0940   LEUKOCYTESUR NEGATIVE 11/22/2017 0940   LEUKOCYTESUR Negative 10/09/2015 1239    STUDIES: Ir Abdomen US Limited  Result Date: 02/20/2018 CLINICAL DATA:  Abdominal carcinomatosis.  Paracentesis requested. EXAM: LIMITED ABDOMEN ULTRASOUND FOR ASCITES TECHNIQUE: Limited ultrasound survey for ascites was performed in all four abdominal quadrants. COMPARISON:  MR 04/06/2017 and previous FINDINGS: There is a small amount of scattered ascites. No large pocket for safe therapeutic paracentesis. IMPRESSION: Small  volume ascites.  Paracentesis deferred. Electronically Signed   By: Lucrezia Europe M.D.   On: 02/20/2018 15:20   Ir Imaging Guided Port Insertion  Result Date: 03/08/2018 INDICATION: History of ovarian cancer. In need of durable intravenous access for chemotherapy administration EXAM: IMPLANTED PORT A CATH PLACEMENT WITH ULTRASOUND AND FLUOROSCOPIC GUIDANCE COMPARISON:  None. MEDICATIONS: Ancef 2 gm IV; The antibiotic was administered within an appropriate time interval prior to skin puncture. ANESTHESIA/SEDATION: Moderate (conscious) sedation was employed during this procedure. A total of Versed 2 mg and Fentanyl 100 mcg was administered intravenously. Moderate Sedation Time: 25 minutes. The patient's level of consciousness and vital signs were monitored continuously by radiology nursing throughout the procedure under my direct supervision. CONTRAST:  None FLUOROSCOPY TIME:  36 seconds (1.7 mGy) COMPLICATIONS: None immediate. PROCEDURE: The procedure, risks, benefits, and alternatives were explained to the patient. Questions regarding the procedure were encouraged and answered. The patient understands and consents to the procedure. The right neck and chest were prepped with chlorhexidine in a sterile fashion, and a sterile drape was applied covering the operative field. Maximum barrier sterile technique with sterile gowns and gloves were used for the procedure. A timeout was performed prior to the initiation of the procedure. Local anesthesia was provided with 1% lidocaine with epinephrine. After creating a small venotomy incision, a micropuncture kit was utilized to access the internal jugular vein. Real-time ultrasound guidance was utilized for vascular access including the acquisition of a permanent ultrasound image documenting patency of the accessed vessel. The microwire was utilized to measure appropriate catheter length. A subcutaneous port pocket was then created along the upper chest wall utilizing a  combination of sharp and blunt dissection. The pocket was irrigated with sterile saline. A single lumen thin power injectable port was chosen for placement. The 8 Fr catheter was tunneled from the port pocket site to the venotomy incision. The port was placed in the pocket. The external catheter was trimmed to appropriate length. At the venotomy, an 8 Fr peel-away sheath was placed over a guidewire under fluoroscopic guidance. The catheter was then placed through the sheath and the sheath was removed. Final catheter positioning was confirmed and documented with a fluoroscopic spot radiograph. The port was accessed with a Huber needle, aspirated and flushed with heparinized saline. The venotomy site was closed with an interrupted 4-0 Vicryl suture. The port pocket incision was closed with interrupted 2-0 Vicryl suture and the skin was opposed with a running subcuticular 4-0 Vicryl suture. Dermabond and Steri-strips were applied to both incisions. Dressings were placed. The patient tolerated the procedure well without immediate post procedural complication. FINDINGS: After catheter placement, the tip lies within the superior cavoatrial junction. The catheter aspirates and flushes normally and is ready for immediate use. IMPRESSION: Successful placement of a right internal jugular approach power injectable Port-A-Cath. The catheter is ready for immediate use. Electronically Signed   By: Eldridge Abrahams.D.  On: 03/08/2018 15:37     ELIGIBLE FOR AVAILABLE RESEARCH PROTOCOL:  discussing protocols at [facility] [place]: 53 y.o. BRCA negative [place] woman status post radical tumor debulking with optimal cytoreduction (R0) 03/27/2015 for a stage IIIC, high-grade right fallopian tube carcinoma  (a) baseline CA-125 was 1226.  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>G (p.Lys449Lys)  (c) tumor is PD-L1 negative (02/24/2016)  (d) tumor is strongly estrogen receptor positive, progesterone receptor negative (02/24/2016)  (1) adjuvant chemotherapy consisted of carboplatin and paclitaxel for 6 doses, begun 04/14/2015, completed 08/11/2015  (a) paclitaxel was omitted from cycle 5 and dose reduced on cycle 6 because of neuropathy   (b) a "make up" dose of paclitaxel was given 08/25/2015  (c) last carboplatin dose was 08/11/2015  (d) CA-125 normalized by 06/16/2015   (2) FIRST RECURRENCE: January 2018  (a) CA-125 rise beginning November 2017 led to CT scans and PET scans December 2017, all negative  (b) exploratory laparotomy 02/24/2016 showed pathologically confirmed recurrence, with miliary disease involving all examined surfaces  (c) port-site metastases noted 03/08/2016  (d) the January 2018 tumor sample was tested for the estrogen receptor and he was 80 percent positive with strong staining, progesterone receptor negative  (3) carboplatin/liposomal doxorubicin started 03/05/2016, repeated every three weeks x4, completed 06/04/2016.  (a) cycle 3 delayed one week because of neutropenia; OnPro added  (b) CA 125 normalized after cycle 3  (4) RUCAPARIB maintenance started 07/02/2016  (a) restaging 10/01/2016: Normal CA 125, negative CT of the abdomen and pelvis  (b) labs 11/24/2016 shows a rise in her CA 125-43.4, with continuing rise thereafter  (c) rucaparib discontinued October 2018 with rising CA 125  (5) anastrozole started December 23, 2016-stopped after a couple of weeks with poor tolerance  SECOND RECURRENCE: (6) Gemcitabine/Bevacizumab started on 02/04/2017  (a) gemcitabine omitted cycle 4 because of intercurrent infection  (b) gemcitabine resumed with cycle 5, with intervening rise in  Ca 125  (c) repeat CT scan of the abdomen and pelvis on 05/26/2017 does not confirm obvious disease progression  (d) cycle 5 of  gemcitabine/bevacizumab delayed 1 week   (e) gemcitabine/ bevacizumab discontinued after 6 cycles, with rising CA 125 (last dose 06/24/2017)  (7) foundation one testing did not show a high mutation burden and the microsatellite status was stable.  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  These were not immediately actionable  (8) Abraxane started 07/12/2017, given weekly or weeks 1 and 2 of each 3 weeks cycle, last dose 11/15/2017 (5 cycles)  (a) cycle 2 started 08/09/2017, will be day 1 day 8 only  (b) cycle 3 scheduled to start 09/13/2017, day 1 day 8 at patient request  (c) CT of the abdomen and pelvis 11/28/2017 shows no measurable disease  (d) PET scan 01/25/2018 and MRI of the pelvis 02/03/2018 show no measurable disease  (e) patient has symptomatic ascites and a rapidly rising Ca1 25  (9) started cisplatin/gemcitabine days 1 and 8 of each 21-day cycle on 02/18/2018   PLAN: Kathryn Ramsey is having more pain from her carcinomatosis.  Very likely we are dealing with an early partial obstruction.  She is having some constipation but this is not a new problem and it is not any worse.  We discussed bowel prophylaxis and she needs to have soft bowel movements every day if possible.  She  does feel better when she does have a bowel movement which is a great motivator.  We went over stool softeners, MiraLAX, as well as if necessary enemas or other options.  I offered to write her a stronger pain medicine in case she gets worse over the weekend but she tells me that would not be necessary.  Her counts were a little low and we gave her a dose of growth factor today. This should allow [name] to treat her Monday as scheduled. She will need additional doses days 2, 3 and 4 as well as day 9 if we are to keep her treatments on time.  The drop in the CA 125 is very encouraging.  She knows to call for any other problems that may develop   , Kathryn Dad, MD  03/10/18 5:00 PM Medical Oncology and  Hematology [facility] [address] [place], [place] [address] Tel. [phone]    Fax. [phone]  I, Jacqualyn Posey am acting as a Education administrator for Chauncey Cruel, MD.   I, Lurline Del MD, have reviewed the above documentation for accuracy and completeness, and I agree with the above.

## 2018-03-10 ENCOUNTER — Inpatient Hospital Stay: Payer: 59 | Admitting: Oncology

## 2018-03-10 ENCOUNTER — Other Ambulatory Visit: Payer: 59

## 2018-03-10 ENCOUNTER — Telehealth: Payer: Self-pay | Admitting: Oncology

## 2018-03-10 ENCOUNTER — Encounter: Payer: Self-pay | Admitting: Oncology

## 2018-03-10 VITALS — BP 112/66 | HR 68 | Temp 97.8°F | Resp 16 | Ht 63.0 in | Wt 123.8 lb

## 2018-03-10 DIAGNOSIS — Z5189 Encounter for other specified aftercare: Secondary | ICD-10-CM | POA: Diagnosis not present

## 2018-03-10 DIAGNOSIS — C786 Secondary malignant neoplasm of retroperitoneum and peritoneum: Secondary | ICD-10-CM

## 2018-03-10 DIAGNOSIS — C762 Malignant neoplasm of abdomen: Secondary | ICD-10-CM | POA: Diagnosis not present

## 2018-03-10 DIAGNOSIS — C801 Malignant (primary) neoplasm, unspecified: Principal | ICD-10-CM

## 2018-03-10 DIAGNOSIS — C5701 Malignant neoplasm of right fallopian tube: Secondary | ICD-10-CM

## 2018-03-10 DIAGNOSIS — Z79899 Other long term (current) drug therapy: Secondary | ICD-10-CM

## 2018-03-10 DIAGNOSIS — D702 Other drug-induced agranulocytosis: Secondary | ICD-10-CM

## 2018-03-10 DIAGNOSIS — D701 Agranulocytosis secondary to cancer chemotherapy: Secondary | ICD-10-CM | POA: Diagnosis not present

## 2018-03-10 DIAGNOSIS — Z85828 Personal history of other malignant neoplasm of skin: Secondary | ICD-10-CM

## 2018-03-10 DIAGNOSIS — G893 Neoplasm related pain (acute) (chronic): Secondary | ICD-10-CM

## 2018-03-10 DIAGNOSIS — K5900 Constipation, unspecified: Secondary | ICD-10-CM

## 2018-03-10 HISTORY — DX: Other drug-induced agranulocytosis: D70.2

## 2018-03-10 LAB — CA 125: Cancer Antigen (CA) 125: 3218 U/mL — ABNORMAL HIGH (ref 0.0–38.1)

## 2018-03-10 MED ORDER — TBO-FILGRASTIM 480 MCG/0.8ML ~~LOC~~ SOSY
480.0000 ug | PREFILLED_SYRINGE | Freq: Once | SUBCUTANEOUS | Status: DC
  Administered 2018-03-10: 480 ug via SUBCUTANEOUS

## 2018-03-10 MED ORDER — FILGRASTIM 480 MCG/0.8ML IJ SOSY: 480.0000 ug | PREFILLED_SYRINGE | Freq: Once | INTRAMUSCULAR | Status: DC

## 2018-03-10 MED ORDER — TBO-FILGRASTIM 480 MCG/0.8ML ~~LOC~~ SOSY
PREFILLED_SYRINGE | SUBCUTANEOUS | Status: AC
  Filled 2018-03-10: qty 0.8

## 2018-03-10 NOTE — Telephone Encounter (Signed)
No los °

## 2018-03-13 ENCOUNTER — Inpatient Hospital Stay: Payer: 59

## 2018-03-13 ENCOUNTER — Encounter: Payer: Self-pay | Admitting: Pharmacist

## 2018-03-13 ENCOUNTER — Other Ambulatory Visit: Payer: Self-pay | Admitting: Oncology

## 2018-03-13 VITALS — BP 115/67 | HR 69 | Temp 98.4°F | Resp 16

## 2018-03-13 DIAGNOSIS — C762 Malignant neoplasm of abdomen: Secondary | ICD-10-CM

## 2018-03-13 DIAGNOSIS — C5701 Malignant neoplasm of right fallopian tube: Secondary | ICD-10-CM

## 2018-03-13 DIAGNOSIS — Z7189 Other specified counseling: Secondary | ICD-10-CM

## 2018-03-13 DIAGNOSIS — Z5189 Encounter for other specified aftercare: Secondary | ICD-10-CM | POA: Diagnosis not present

## 2018-03-13 DIAGNOSIS — C786 Secondary malignant neoplasm of retroperitoneum and peritoneum: Secondary | ICD-10-CM

## 2018-03-13 DIAGNOSIS — C801 Malignant (primary) neoplasm, unspecified: Principal | ICD-10-CM

## 2018-03-13 LAB — CBC WITH DIFFERENTIAL/PLATELET
Abs Immature Granulocytes: 1.07 10*3/uL — ABNORMAL HIGH (ref 0.00–0.07)
Basophils Absolute: 0 10*3/uL (ref 0.0–0.1)
Basophils Relative: 0 %
Eosinophils Absolute: 0 10*3/uL (ref 0.0–0.5)
Eosinophils Relative: 0 %
HCT: 30.2 % — ABNORMAL LOW (ref 36.0–46.0)
Hemoglobin: 9.9 g/dL — ABNORMAL LOW (ref 12.0–15.0)
Immature Granulocytes: 13 %
Lymphocytes Relative: 24 %
Lymphs Abs: 2.1 10*3/uL (ref 0.7–4.0)
MCH: 30.7 pg (ref 26.0–34.0)
MCHC: 32.8 g/dL (ref 30.0–36.0)
MCV: 93.5 fL (ref 80.0–100.0)
Monocytes Absolute: 1.8 10*3/uL — ABNORMAL HIGH (ref 0.1–1.0)
Monocytes Relative: 21 %
Neutro Abs: 3.7 10*3/uL (ref 1.7–7.7)
Neutrophils Relative %: 42 %
Platelets: 528 10*3/uL — ABNORMAL HIGH (ref 150–400)
RBC: 3.23 MIL/uL — ABNORMAL LOW (ref 3.87–5.11)
RDW: 12.4 % (ref 11.5–15.5)
Smear Review: 1
WBC: 8.6 10*3/uL (ref 4.0–10.5)
nRBC: 0.3 % — ABNORMAL HIGH (ref 0.0–0.2)

## 2018-03-13 MED ORDER — SODIUM CHLORIDE 0.9 % IV SOLN
800.0000 mg/m2 | Freq: Once | INTRAVENOUS | Status: AC
  Administered 2018-03-13: 1254 mg via INTRAVENOUS
  Filled 2018-03-13: qty 32.98

## 2018-03-13 MED ORDER — PALONOSETRON HCL INJECTION 0.25 MG/5ML
0.2500 mg | Freq: Once | INTRAVENOUS | Status: AC
  Administered 2018-03-13: 0.25 mg via INTRAVENOUS

## 2018-03-13 MED ORDER — SODIUM CHLORIDE 0.9 % IV SOLN
Freq: Once | INTRAVENOUS | Status: AC
  Administered 2018-03-13: 09:00:00 via INTRAVENOUS
  Filled 2018-03-13: qty 250

## 2018-03-13 MED ORDER — SODIUM CHLORIDE 0.9 % IV SOLN
40.0000 mg/m2 | Freq: Once | INTRAVENOUS | Status: AC
  Administered 2018-03-13: 63 mg via INTRAVENOUS
  Filled 2018-03-13: qty 63

## 2018-03-13 MED ORDER — PALONOSETRON HCL INJECTION 0.25 MG/5ML
INTRAVENOUS | Status: AC
  Filled 2018-03-13: qty 5

## 2018-03-13 MED ORDER — POTASSIUM CHLORIDE 2 MEQ/ML IV SOLN
Freq: Once | INTRAVENOUS | Status: AC
  Administered 2018-03-13: 10:00:00 via INTRAVENOUS
  Filled 2018-03-13: qty 10

## 2018-03-13 MED ORDER — SODIUM CHLORIDE 0.9% FLUSH
10.0000 mL | INTRAVENOUS | Status: DC | PRN
  Administered 2018-03-13: 10 mL
  Filled 2018-03-13: qty 10

## 2018-03-13 MED ORDER — HEPARIN SOD (PORK) LOCK FLUSH 100 UNIT/ML IV SOLN
500.0000 [IU] | Freq: Once | INTRAVENOUS | Status: AC | PRN
  Administered 2018-03-13: 500 [IU]
  Filled 2018-03-13: qty 5

## 2018-03-13 MED ORDER — SODIUM CHLORIDE 0.9 % IV SOLN
Freq: Once | INTRAVENOUS | Status: AC
  Administered 2018-03-13: 12:00:00 via INTRAVENOUS
  Filled 2018-03-13: qty 5

## 2018-03-13 NOTE — Patient Instructions (Signed)
[facility] Discharge Instructions for Patients Receiving Chemotherapy  Today you received the following chemotherapy agents Gemzar, Cisplatin. To help prevent nausea and vomiting after your treatment, we encourage you to take your nausea medication. DO NOT TAKE ZOFRAN FOR THREE DAYS AFTER TREATMENT.   If you develop nausea and vomiting that is not controlled by your nausea medication, call the clinic.   BELOW ARE SYMPTOMS THAT SHOULD BE REPORTED IMMEDIATELY:  *FEVER GREATER THAN 100.5 F  *CHILLS WITH OR WITHOUT FEVER  NAUSEA AND VOMITING THAT IS NOT CONTROLLED WITH YOUR NAUSEA MEDICATION  *UNUSUAL SHORTNESS OF BREATH  *UNUSUAL BRUISING OR BLEEDING  TENDERNESS IN MOUTH AND THROAT WITH OR WITHOUT PRESENCE OF ULCERS  *URINARY PROBLEMS  *BOWEL PROBLEMS  UNUSUAL RASH Items with * indicate a potential emergency and should be followed up as soon as possible.  Feel free to call the clinic should you have any questions or concerns. The clinic phone number is [phone].  Please show the [facility] at check-in to the Emergency Department and triage nurse.

## 2018-03-15 ENCOUNTER — Inpatient Hospital Stay: Payer: 59

## 2018-03-15 VITALS — BP 114/50 | HR 53 | Temp 97.7°F | Resp 18

## 2018-03-15 DIAGNOSIS — C801 Malignant (primary) neoplasm, unspecified: Principal | ICD-10-CM

## 2018-03-15 DIAGNOSIS — Z5189 Encounter for other specified aftercare: Secondary | ICD-10-CM | POA: Diagnosis not present

## 2018-03-15 DIAGNOSIS — C786 Secondary malignant neoplasm of retroperitoneum and peritoneum: Secondary | ICD-10-CM

## 2018-03-15 DIAGNOSIS — C5701 Malignant neoplasm of right fallopian tube: Secondary | ICD-10-CM

## 2018-03-15 DIAGNOSIS — C762 Malignant neoplasm of abdomen: Secondary | ICD-10-CM

## 2018-03-15 DIAGNOSIS — Z7189 Other specified counseling: Secondary | ICD-10-CM

## 2018-03-15 MED ORDER — FILGRASTIM-SNDZ 480 MCG/0.8ML IJ SOSY
480.0000 ug | PREFILLED_SYRINGE | Freq: Once | INTRAMUSCULAR | Status: AC
  Administered 2018-03-15: 480 ug via SUBCUTANEOUS
  Filled 2018-03-15: qty 0.8

## 2018-03-15 NOTE — Patient Instructions (Signed)
Filgrastim, G-CSF injection What is this medicine? FILGRASTIM, G-CSF (fil GRA stim) is a granulocyte colony-stimulating factor that stimulates the growth of neutrophils, a type of white blood cell (WBC) important in the body's fight against infection. It is used to reduce the incidence of fever and infection in patients with certain types of cancer who are receiving chemotherapy that affects the bone marrow, to stimulate blood cell production for removal of WBCs from the body prior to a bone marrow transplantation, to reduce the incidence of fever and infection in patients who have severe chronic neutropenia, and to improve survival outcomes following high-dose radiation exposure that is toxic to the bone marrow. This medicine may be used for other purposes; ask your health care provider or pharmacist if you have questions. COMMON BRAND NAME(S): Neupogen, Nivestym, Zarxio What should I tell my health care provider before I take this medicine? They need to know if you have any of these conditions: -kidney disease -latex allergy -ongoing radiation therapy -sickle cell disease -an unusual or allergic reaction to filgrastim, pegfilgrastim, other medicines, foods, dyes, or preservatives -pregnant or trying to get pregnant -breast-feeding How should I use this medicine? This medicine is for injection under the skin or infusion into a vein. As an infusion into a vein, it is usually given by a health care professional in a hospital or clinic setting. If you get this medicine at home, you will be taught how to prepare and give this medicine. Refer to the Instructions for Use that come with your medication packaging. Use exactly as directed. Take your medicine at regular intervals. Do not take your medicine more often than directed. It is important that you put your used needles and syringes in a special sharps container. Do not put them in a trash can. If you do not have a sharps container, call your  pharmacist or healthcare provider to get one. Talk to your pediatrician regarding the use of this medicine in children. While this drug may be prescribed for children as young as 7 months for selected conditions, precautions do apply. Overdosage: If you think you have taken too much of this medicine contact a poison control center or emergency room at once. NOTE: This medicine is only for you. Do not share this medicine with others. What if I miss a dose? It is important not to miss your dose. Call your doctor or health care professional if you miss a dose. What may interact with this medicine? This medicine may interact with the following medications: -medicines that may cause a release of neutrophils, such as lithium This list may not describe all possible interactions. Give your health care provider a list of all the medicines, herbs, non-prescription drugs, or dietary supplements you use. Also tell them if you smoke, drink alcohol, or use illegal drugs. Some items may interact with your medicine. What should I watch for while using this medicine? You may need blood work done while you are taking this medicine. What side effects may I notice from receiving this medicine? Side effects that you should report to your doctor or health care professional as soon as possible: -allergic reactions like skin rash, itching or hives, swelling of the face, lips, or tongue -back pain -dizziness or feeling faint -fever -pain, redness, or irritation at site where injected -pinpoint red spots on the skin -shortness of breath or breathing problems -signs and symptoms of kidney injury like trouble passing urine, change in the amount of urine, or red or dark-brown urine -stomach   or side pain, or pain at the shoulder -swelling -tiredness -unusual bleeding or bruising Side effects that usually do not require medical attention (report to your doctor or health care professional if they continue or are  bothersome): -bone pain -cough -diarrhea -hair loss -headache -muscle pain This list may not describe all possible side effects. Call your doctor for medical advice about side effects. You may report side effects to FDA at 1-800-FDA-1088. Where should I keep my medicine? Keep out of the reach of children. Store in a refrigerator between 2 and 8 degrees C (36 and 46 degrees F). Do not freeze. Keep in carton to protect from light. Throw away this medicine if vials or syringes are left out of the refrigerator for more than 24 hours. Throw away any unused medicine after the expiration date. NOTE: This sheet is a summary. It may not cover all possible information. If you have questions about this medicine, talk to your doctor, pharmacist, or health care provider.  2019 [employer] (2017-09-16 15:39:45)  

## 2018-03-16 ENCOUNTER — Inpatient Hospital Stay: Payer: 59

## 2018-03-16 VITALS — BP 116/69 | HR 60 | Temp 98.1°F | Resp 18

## 2018-03-16 DIAGNOSIS — D701 Agranulocytosis secondary to cancer chemotherapy: Secondary | ICD-10-CM

## 2018-03-16 DIAGNOSIS — C762 Malignant neoplasm of abdomen: Secondary | ICD-10-CM

## 2018-03-16 DIAGNOSIS — Z5189 Encounter for other specified aftercare: Secondary | ICD-10-CM | POA: Diagnosis not present

## 2018-03-16 DIAGNOSIS — D702 Other drug-induced agranulocytosis: Secondary | ICD-10-CM

## 2018-03-16 DIAGNOSIS — T451X5A Adverse effect of antineoplastic and immunosuppressive drugs, initial encounter: Secondary | ICD-10-CM

## 2018-03-16 MED ORDER — FILGRASTIM-SNDZ 480 MCG/0.8ML IJ SOSY
480.0000 ug | PREFILLED_SYRINGE | Freq: Once | INTRAMUSCULAR | Status: AC
  Administered 2018-03-16: 480 ug via SUBCUTANEOUS
  Filled 2018-03-16: qty 0.8

## 2018-03-16 NOTE — Patient Instructions (Signed)
Filgrastim, G-CSF injection What is this medicine? FILGRASTIM, G-CSF (fil GRA stim) is a granulocyte colony-stimulating factor that stimulates the growth of neutrophils, a type of white blood cell (WBC) important in the body's fight against infection. It is used to reduce the incidence of fever and infection in patients with certain types of cancer who are receiving chemotherapy that affects the bone marrow, to stimulate blood cell production for removal of WBCs from the body prior to a bone marrow transplantation, to reduce the incidence of fever and infection in patients who have severe chronic neutropenia, and to improve survival outcomes following high-dose radiation exposure that is toxic to the bone marrow. This medicine may be used for other purposes; ask your health care provider or pharmacist if you have questions. COMMON BRAND NAME(S): Neupogen, Nivestym, Zarxio What should I tell my health care provider before I take this medicine? They need to know if you have any of these conditions: -kidney disease -latex allergy -ongoing radiation therapy -sickle cell disease -an unusual or allergic reaction to filgrastim, pegfilgrastim, other medicines, foods, dyes, or preservatives -pregnant or trying to get pregnant -breast-feeding How should I use this medicine? This medicine is for injection under the skin or infusion into a vein. As an infusion into a vein, it is usually given by a health care professional in a hospital or clinic setting. If you get this medicine at home, you will be taught how to prepare and give this medicine. Refer to the Instructions for Use that come with your medication packaging. Use exactly as directed. Take your medicine at regular intervals. Do not take your medicine more often than directed. It is important that you put your used needles and syringes in a special sharps container. Do not put them in a trash can. If you do not have a sharps container, call your  pharmacist or healthcare provider to get one. Talk to your pediatrician regarding the use of this medicine in children. While this drug may be prescribed for children as young as 7 months for selected conditions, precautions do apply. Overdosage: If you think you have taken too much of this medicine contact a poison control center or emergency room at once. NOTE: This medicine is only for you. Do not share this medicine with others. What if I miss a dose? It is important not to miss your dose. Call your doctor or health care professional if you miss a dose. What may interact with this medicine? This medicine may interact with the following medications: -medicines that may cause a release of neutrophils, such as lithium This list may not describe all possible interactions. Give your health care provider a list of all the medicines, herbs, non-prescription drugs, or dietary supplements you use. Also tell them if you smoke, drink alcohol, or use illegal drugs. Some items may interact with your medicine. What should I watch for while using this medicine? You may need blood work done while you are taking this medicine. What side effects may I notice from receiving this medicine? Side effects that you should report to your doctor or health care professional as soon as possible: -allergic reactions like skin rash, itching or hives, swelling of the face, lips, or tongue -back pain -dizziness or feeling faint -fever -pain, redness, or irritation at site where injected -pinpoint red spots on the skin -shortness of breath or breathing problems -signs and symptoms of kidney injury like trouble passing urine, change in the amount of urine, or red or dark-brown urine -stomach   or side pain, or pain at the shoulder -swelling -tiredness -unusual bleeding or bruising Side effects that usually do not require medical attention (report to your doctor or health care professional if they continue or are  bothersome): -bone pain -cough -diarrhea -hair loss -headache -muscle pain This list may not describe all possible side effects. Call your doctor for medical advice about side effects. You may report side effects to FDA at 1-800-FDA-1088. Where should I keep my medicine? Keep out of the reach of children. Store in a refrigerator between 2 and 8 degrees C (36 and 46 degrees F). Do not freeze. Keep in carton to protect from light. Throw away this medicine if vials or syringes are left out of the refrigerator for more than 24 hours. Throw away any unused medicine after the expiration date. NOTE: This sheet is a summary. It may not cover all possible information. If you have questions about this medicine, talk to your doctor, pharmacist, or health care provider.  2019 [employer] (2017-09-16 15:39:45)  

## 2018-03-17 ENCOUNTER — Inpatient Hospital Stay: Payer: 59

## 2018-03-17 ENCOUNTER — Other Ambulatory Visit: Payer: Self-pay | Admitting: Oncology

## 2018-03-17 VITALS — BP 105/65 | HR 71 | Temp 98.1°F | Resp 18

## 2018-03-17 DIAGNOSIS — T451X5A Adverse effect of antineoplastic and immunosuppressive drugs, initial encounter: Secondary | ICD-10-CM

## 2018-03-17 DIAGNOSIS — C5701 Malignant neoplasm of right fallopian tube: Secondary | ICD-10-CM

## 2018-03-17 DIAGNOSIS — Z7189 Other specified counseling: Secondary | ICD-10-CM

## 2018-03-17 DIAGNOSIS — Z5189 Encounter for other specified aftercare: Secondary | ICD-10-CM | POA: Diagnosis not present

## 2018-03-17 DIAGNOSIS — C762 Malignant neoplasm of abdomen: Secondary | ICD-10-CM

## 2018-03-17 DIAGNOSIS — C786 Secondary malignant neoplasm of retroperitoneum and peritoneum: Secondary | ICD-10-CM

## 2018-03-17 DIAGNOSIS — C801 Malignant (primary) neoplasm, unspecified: Principal | ICD-10-CM

## 2018-03-17 DIAGNOSIS — D702 Other drug-induced agranulocytosis: Secondary | ICD-10-CM

## 2018-03-17 DIAGNOSIS — D701 Agranulocytosis secondary to cancer chemotherapy: Secondary | ICD-10-CM

## 2018-03-17 MED ORDER — FILGRASTIM-SNDZ 480 MCG/0.8ML IJ SOSY
480.0000 ug | PREFILLED_SYRINGE | Freq: Once | INTRAMUSCULAR | Status: AC
  Administered 2018-03-17: 480 ug via SUBCUTANEOUS
  Filled 2018-03-17: qty 0.8

## 2018-03-17 NOTE — Patient Instructions (Signed)
Filgrastim, G-CSF injection What is this medicine? FILGRASTIM, G-CSF (fil GRA stim) is a granulocyte colony-stimulating factor that stimulates the growth of neutrophils, a type of white blood cell (WBC) important in the body's fight against infection. It is used to reduce the incidence of fever and infection in patients with certain types of cancer who are receiving chemotherapy that affects the bone marrow, to stimulate blood cell production for removal of WBCs from the body prior to a bone marrow transplantation, to reduce the incidence of fever and infection in patients who have severe chronic neutropenia, and to improve survival outcomes following high-dose radiation exposure that is toxic to the bone marrow. This medicine may be used for other purposes; ask your health care provider or pharmacist if you have questions. COMMON BRAND NAME(S): Neupogen, Nivestym, Zarxio What should I tell my health care provider before I take this medicine? They need to know if you have any of these conditions: -kidney disease -latex allergy -ongoing radiation therapy -sickle cell disease -an unusual or allergic reaction to filgrastim, pegfilgrastim, other medicines, foods, dyes, or preservatives -pregnant or trying to get pregnant -breast-feeding How should I use this medicine? This medicine is for injection under the skin or infusion into a vein. As an infusion into a vein, it is usually given by a health care professional in a hospital or clinic setting. If you get this medicine at home, you will be taught how to prepare and give this medicine. Refer to the Instructions for Use that come with your medication packaging. Use exactly as directed. Take your medicine at regular intervals. Do not take your medicine more often than directed. It is important that you put your used needles and syringes in a special sharps container. Do not put them in a trash can. If you do not have a sharps container, call your  pharmacist or healthcare provider to get one. Talk to your pediatrician regarding the use of this medicine in children. While this drug may be prescribed for children as young as 7 months for selected conditions, precautions do apply. Overdosage: If you think you have taken too much of this medicine contact a poison control center or emergency room at once. NOTE: This medicine is only for you. Do not share this medicine with others. What if I miss a dose? It is important not to miss your dose. Call your doctor or health care professional if you miss a dose. What may interact with this medicine? This medicine may interact with the following medications: -medicines that may cause a release of neutrophils, such as lithium This list may not describe all possible interactions. Give your health care provider a list of all the medicines, herbs, non-prescription drugs, or dietary supplements you use. Also tell them if you smoke, drink alcohol, or use illegal drugs. Some items may interact with your medicine. What should I watch for while using this medicine? You may need blood work done while you are taking this medicine. What side effects may I notice from receiving this medicine? Side effects that you should report to your doctor or health care professional as soon as possible: -allergic reactions like skin rash, itching or hives, swelling of the face, lips, or tongue -back pain -dizziness or feeling faint -fever -pain, redness, or irritation at site where injected -pinpoint red spots on the skin -shortness of breath or breathing problems -signs and symptoms of kidney injury like trouble passing urine, change in the amount of urine, or red or dark-brown urine -stomach   or side pain, or pain at the shoulder -swelling -tiredness -unusual bleeding or bruising Side effects that usually do not require medical attention (report to your doctor or health care professional if they continue or are  bothersome): -bone pain -cough -diarrhea -hair loss -headache -muscle pain This list may not describe all possible side effects. Call your doctor for medical advice about side effects. You may report side effects to FDA at 1-800-FDA-1088. Where should I keep my medicine? Keep out of the reach of children. Store in a refrigerator between 2 and 8 degrees C (36 and 46 degrees F). Do not freeze. Keep in carton to protect from light. Throw away this medicine if vials or syringes are left out of the refrigerator for more than 24 hours. Throw away any unused medicine after the expiration date. NOTE: This sheet is a summary. It may not cover all possible information. If you have questions about this medicine, talk to your doctor, pharmacist, or health care provider.  2019 [employer] (2017-09-16 15:39:45)  

## 2018-03-20 ENCOUNTER — Inpatient Hospital Stay: Payer: 59 | Attending: Oncology

## 2018-03-20 ENCOUNTER — Inpatient Hospital Stay: Payer: 59

## 2018-03-20 ENCOUNTER — Encounter: Payer: Self-pay | Admitting: Adult Health

## 2018-03-20 ENCOUNTER — Inpatient Hospital Stay (HOSPITAL_BASED_OUTPATIENT_CLINIC_OR_DEPARTMENT_OTHER): Payer: 59 | Admitting: Adult Health

## 2018-03-20 ENCOUNTER — Telehealth: Payer: Self-pay | Admitting: Adult Health

## 2018-03-20 VITALS — BP 136/73 | HR 64 | Temp 98.8°F | Resp 18 | Ht 63.0 in | Wt 124.1 lb

## 2018-03-20 DIAGNOSIS — C786 Secondary malignant neoplasm of retroperitoneum and peritoneum: Secondary | ICD-10-CM

## 2018-03-20 DIAGNOSIS — M7989 Other specified soft tissue disorders: Secondary | ICD-10-CM | POA: Diagnosis not present

## 2018-03-20 DIAGNOSIS — Z79899 Other long term (current) drug therapy: Secondary | ICD-10-CM | POA: Insufficient documentation

## 2018-03-20 DIAGNOSIS — Z5111 Encounter for antineoplastic chemotherapy: Secondary | ICD-10-CM | POA: Insufficient documentation

## 2018-03-20 DIAGNOSIS — C762 Malignant neoplasm of abdomen: Secondary | ICD-10-CM

## 2018-03-20 DIAGNOSIS — C801 Malignant (primary) neoplasm, unspecified: Principal | ICD-10-CM

## 2018-03-20 DIAGNOSIS — D701 Agranulocytosis secondary to cancer chemotherapy: Secondary | ICD-10-CM | POA: Diagnosis not present

## 2018-03-20 DIAGNOSIS — C5701 Malignant neoplasm of right fallopian tube: Secondary | ICD-10-CM | POA: Diagnosis not present

## 2018-03-20 DIAGNOSIS — Z5189 Encounter for other specified aftercare: Secondary | ICD-10-CM | POA: Insufficient documentation

## 2018-03-20 DIAGNOSIS — Z7189 Other specified counseling: Secondary | ICD-10-CM

## 2018-03-20 LAB — COMPREHENSIVE METABOLIC PANEL
ALT: 22 U/L (ref 0–44)
AST: 17 U/L (ref 15–41)
Albumin: 4 g/dL (ref 3.5–5.0)
Alkaline Phosphatase: 139 U/L — ABNORMAL HIGH (ref 38–126)
Anion gap: 13 (ref 5–15)
BUN: 15 mg/dL (ref 6–20)
CO2: 25 mmol/L (ref 22–32)
Calcium: 9.5 mg/dL (ref 8.9–10.3)
Chloride: 99 mmol/L (ref 98–111)
Creatinine, Ser: 0.77 mg/dL (ref 0.44–1.00)
GFR calc Af Amer: >60 mL/min (ref >60)
GFR calc non Af Amer: >60 mL/min (ref >60)
Glucose, Bld: 91 mg/dL (ref 70–99)
Potassium: 3.9 mmol/L (ref 3.5–5.1)
Sodium: 137 mmol/L (ref 135–145)
Total Bilirubin: 0.2 mg/dL — ABNORMAL LOW (ref 0.3–1.2)
Total Protein: 8.1 g/dL (ref 6.5–8.1)

## 2018-03-20 LAB — CBC WITH DIFFERENTIAL/PLATELET
Abs Immature Granulocytes: 1.46 10*3/uL — ABNORMAL HIGH (ref 0.00–0.07)
Basophils Absolute: 0 10*3/uL (ref 0.0–0.1)
Basophils Relative: 0 %
Eosinophils Absolute: 0 10*3/uL (ref 0.0–0.5)
Eosinophils Relative: 0 %
HCT: 35 % — ABNORMAL LOW (ref 36.0–46.0)
Hemoglobin: 11.2 g/dL — ABNORMAL LOW (ref 12.0–15.0)
Immature Granulocytes: 14 %
Lymphocytes Relative: 21 %
Lymphs Abs: 2.2 10*3/uL (ref 0.7–4.0)
MCH: 30.5 pg (ref 26.0–34.0)
MCHC: 32 g/dL (ref 30.0–36.0)
MCV: 95.4 fL (ref 80.0–100.0)
Monocytes Absolute: 0.9 10*3/uL (ref 0.1–1.0)
Monocytes Relative: 8 %
Neutro Abs: 5.9 10*3/uL (ref 1.7–7.7)
Neutrophils Relative %: 57 %
Platelets: 287 10*3/uL (ref 150–400)
RBC: 3.67 MIL/uL — ABNORMAL LOW (ref 3.87–5.11)
RDW: 12.5 % (ref 11.5–15.5)
WBC: 10.5 10*3/uL (ref 4.0–10.5)
nRBC: 0 % (ref 0.0–0.2)

## 2018-03-20 MED ORDER — PALONOSETRON HCL INJECTION 0.25 MG/5ML
INTRAVENOUS | Status: AC
  Filled 2018-03-20: qty 5

## 2018-03-20 MED ORDER — PALONOSETRON HCL INJECTION 0.25 MG/5ML
0.2500 mg | Freq: Once | INTRAVENOUS | Status: AC
  Administered 2018-03-20: 0.25 mg via INTRAVENOUS

## 2018-03-20 MED ORDER — SODIUM CHLORIDE 0.9 % IV SOLN
800.0000 mg/m2 | Freq: Once | INTRAVENOUS | Status: AC
  Administered 2018-03-20: 1254 mg via INTRAVENOUS
  Filled 2018-03-20: qty 32.98

## 2018-03-20 MED ORDER — PEGFILGRASTIM 6 MG/0.6ML ~~LOC~~ PSKT
PREFILLED_SYRINGE | SUBCUTANEOUS | Status: AC
  Filled 2018-03-20: qty 0.6

## 2018-03-20 MED ORDER — POTASSIUM CHLORIDE 2 MEQ/ML IV SOLN
Freq: Once | INTRAVENOUS | Status: AC
  Administered 2018-03-20: 11:00:00 via INTRAVENOUS
  Filled 2018-03-20: qty 10

## 2018-03-20 MED ORDER — SODIUM CHLORIDE 0.9% FLUSH
10.0000 mL | INTRAVENOUS | Status: DC | PRN
  Administered 2018-03-20: 10 mL
  Filled 2018-03-20: qty 10

## 2018-03-20 MED ORDER — SODIUM CHLORIDE 0.9 % IV SOLN
40.0000 mg/m2 | Freq: Once | INTRAVENOUS | Status: AC
  Administered 2018-03-20: 63 mg via INTRAVENOUS
  Filled 2018-03-20: qty 63

## 2018-03-20 MED ORDER — SODIUM CHLORIDE 0.9 % IV SOLN
Freq: Once | INTRAVENOUS | Status: AC
  Administered 2018-03-20: 12:00:00 via INTRAVENOUS
  Filled 2018-03-20: qty 5

## 2018-03-20 MED ORDER — PEGFILGRASTIM 6 MG/0.6ML ~~LOC~~ PSKT
6.0000 mg | PREFILLED_SYRINGE | Freq: Once | SUBCUTANEOUS | Status: AC
  Administered 2018-03-20: 6 mg via SUBCUTANEOUS

## 2018-03-20 MED ORDER — SODIUM CHLORIDE 0.9 % IV SOLN
Freq: Once | INTRAVENOUS | Status: AC
  Administered 2018-03-20: 10:00:00 via INTRAVENOUS
  Filled 2018-03-20: qty 250

## 2018-03-20 MED ORDER — HEPARIN SOD (PORK) LOCK FLUSH 100 UNIT/ML IV SOLN
500.0000 [IU] | Freq: Once | INTRAVENOUS | Status: AC | PRN
  Administered 2018-03-20: 500 [IU]
  Filled 2018-03-20: qty 5

## 2018-03-20 NOTE — Progress Notes (Signed)
[place]  [phone] Fax:([phone]) [phone]     ID: Kathryn Ramsey DOB: 1965-06-29  MR#: [id number]  PZW#:[id number]  Patient Care Team: Harlan Stains, MD as PCP - General (Family Medicine) [name], MD as Consulting Physician (Obstetrics and Gynecology) Juanita Craver, MD as Consulting Physician (Gastroenterology) Servando Salina, MD as Consulting Physician (Obstetrics and Gynecology) Nickie Retort, MD as Consulting Physician (Urology) Gillis Ends, MD as Referring Physician (Obstetrics and Gynecology) OTHER MD: Festus Aloe [phone])   CHIEF COMPLAINT:  ovarian cancer   CURRENT TREATMENT: Cisplatin, gemcitabine   INTERVAL HISTORY: Orabelle returns today for follow-up and treatment of her recurrent ovarian cancer. Kathryn Ramsey is accompanied by her husband.  Kathryn Ramsey continues on cisplatin/gemcitabine and notes her stomach is starting to feel better.  REVIEW OF SYSTEMS: Charlane is doing well today.  Kathryn Ramsey notes an improvement in her abdominal pain and distention.  Kathryn Ramsey continues to have soft bowel movements and these are regular.  Kathryn Ramsey notes that on her right wrist where the gemcitabine infiltrated last week is still throbbing and slightly swollen from time to time.  There is no redness.  Kathryn Ramsey has not had any fevers or chills.  Kathryn Ramsey says that Kathryn Ramsey does ice her fingertips and toes during her chemotherapy infusions.  Kathryn Ramsey says that Kathryn Ramsey has not noted any peripheral neuroapthy.  Kathryn Ramsey wants to know if Kathryn Ramsey needs all three growth factor injections on days 2, 3, and 4.  Kathryn Ramsey had an increased amount of bone pain and achiness that was difficult to manage.  This has since resolved.  Kathryn Ramsey hasn't had any chest pain, palpitations, cough, or shortness of breath.  A detailed ROS was otherwise non contributory.    HISTORY OF PRESENT ILLNESS: From Dr. Serita Grit original intake note 03/17/2015:  "Kathryn Ramsey is a very pleasant G4P4 who is seen in consultation at the request  of Dr Collene Mares and Dr Garwin Brothers for peritoneal carcinomatosis. The patient has a history of a workup by urology for gross hematuria which included a pelvic examine was suggested for extrinsic mass effect on the bladder on cystoscopy. Cystoscopy took place on in December 2016. The patient denies abdominal pain bloating early satiety or abdominal distention.  On 08/03/2015 at [facility] Kathryn Ramsey underwent a CT scan of the abdomen and pelvis as ordered by Dr Baruch Gouty. This revealed a 1.4 cm low attenuation lesion on the posterior right hepatic lobe suggestive of a hemangioma. Status post hysterectomy. No ovarian masses. Moderate ascites. Omental caking seen in the lateral left abdomen and pelvis. Peritoneal nodularity in the left lateral pelvic cul-de-sac. No gross extrinsic compression on the bladder was identified on imaging.  The patient was then seen by her gastroenterologist, Dr. Collene Mares, who performed a colonoscopy which was unremarkable.  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 Kathryn Ramsey was treated adjuvantly with carboplatin and paclitaxel, as detailed below. Her CA 125 dropped from 1226 on 03/20/2015 to 26.1 by 06/16/2015.  Her subsequent history is as detailed below.  I PAST MEDICAL HISTORY: Past Medical History:  Diagnosis Date  . Acute sensory neuropathy 06/26/2015  . Allergy   . Anemia   . Asthma    illness induced asthma  . Blood  transfusion without reported diagnosis    at age 53 years old D/T surgery to femur being crushed  . 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 Heart  murmur    pt, states had a "working heart murmur"  . History of kidney stones   . Neutropenia, drug-induced (Castana) 03/10/2018  . PONV (postoperative nausea and vomiting)   . Skin cancer     PAST SURGICAL HISTORY: Past Surgical History:  Procedure Laterality Date  . 3 laporscopic proceedures    . ABDOMINAL HYSTERECTOMY     2010  . CHOLECYSTECTOMY     2010  . DILATION AND CURETTAGE OF UTERUS     1997  . IR IMAGING GUIDED PORT INSERTION  03/08/2018  . LAPAROSCOPY N/A 03/27/2015   Procedure: DIAGNOSTIC LAPAROSCOPY ;  Surgeon: [name], MD;  Location: [facility];  Service: Gynecology;  Laterality: N/A;  . LAPAROSCOPY N/A 02/24/2016   Procedure: LAPAROSCOPY DIAGNOSTIC WITH PERITONEAL WASHINGS AND PERITONEAL BIOPSY;  Surgeon: [name], MD;  Location: [facility];  Service: Gynecology;  Laterality: N/A;  . LAPAROTOMY N/A 03/27/2015   Procedure: EXPLORATORY LAPAROTOMY, BILATERAL SALPINGO OOPHORECTOMY, OMENTECTOMY, RADICAL TUMOR DEBULKING;  Surgeon: [name], MD;  Location: [facility];  Service: Gynecology;  Laterality: N/A;  . sinus surgery 1994      FAMILY HISTORY Family History  Problem Relation Age of Onset  . Hypertension Father   . Skin cancer Father        nonmelanoma skin cancers in his late 81s  . Non-Hodgkin's lymphoma Maternal Aunt        dx. 61s; smoker  . Stroke Maternal Grandmother   . Other Mother        benign meningioma dx. early-mid-70s; hysterectomy in her late 74s for heavy periods - still has ovaries  . Other Son        one son with pre-cancerous skin findings  . Other Daughter        cysts on ovaries and hx of heavy periods  . Other Sister        hysterectomy for cysts - still has ovaries  . Heart attack Maternal Grandfather   . Heart attack Paternal Grandfather   . Renal cancer Maternal Aunt 60       smoker  . Breast cancer Maternal Aunt 78  . Other Maternal Aunt        dx. benign brain tumor (meningioma) at 33; 2nd benign brain tumor in her late 55s; hx of radical  hysterectomy at age 63  . Brain cancer Other        NOS tumor  The patient's parents are both living, in their late 35s as of January 2018. The patient has one brother, 3 sisters. There is no history of ovarian cancer in the family. One maternal aunt had breast and kidney cancer, another had non-Hodgkin's lymphoma.  GYNECOLOGIC HISTORY:  No LMP recorded. Patient has had a hysterectomy. 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 Her husband [name] is a Engineer, maintenance (IT). Their children are 69, 61, 41 and 16 y/o as of JAN 2018. The oldest works as an Electrical engineer in the [employer] in [place]. The other 3 children live in [place], the youngest currently attending [employer]. The patient has no grandchildren. Kathryn Ramsey attends [employer]  ADVANCED DIRECTIVES:    HEALTH MAINTENANCE: Social History   Tobacco Use  . Smoking status: Never Smoker  . Smokeless tobacco: Never Used  Substance Use Topics  . Alcohol use: Yes    Comment:  3 glasses wine or beer/week  . Drug use: No     Colonoscopy:2016  PAP: s/p hyst  Bone density:   Allergies  Allergen Reactions  . Bee Venom Shortness Of Breath    swelling  . Compazine [Prochlorperazine Edisylate] Anaphylaxis, Anxiety and Palpitations  . Sulfa Antibiotics Shortness Of Breath    Vomit , diarrhea , hives    Current Outpatient Medications  Medication Sig Dispense Refill  . dexamethasone (DECADRON) 4 MG tablet 2 TABS BY MOUTH ONCE A DAY ON THE DAY AFTER CISPLATIN CHEMO, THEN 2 TABS 2X/DAY FOR 2 DAYS. 30 tablet 0  . fluticasone (FLONASE) 50 MCG/ACT nasal spray Place 1 spray into both nostrils 2 (two) times daily. (Patient taking differently: Place 1 spray into both nostrils as needed. ) 16 g 2  . gabapentin (NEURONTIN) 100 MG capsule Take 1 capsule (100 mg total) by mouth 3 (three) times daily. 270 capsule 3  . lidocaine-prilocaine (EMLA)  cream Apply 1 application topically as needed. 30 g 0  . loratadine (CLARITIN) 10 MG tablet Take 10 mg by mouth as needed.     . magnesium oxide (MAG-OX) 400 (241.3 Mg) MG tablet Take 1 tablet (400 mg total) by mouth daily. 60 tablet 3  . omeprazole (PRILOSEC) 40 MG capsule Take 1 capsule (40 mg total) by mouth daily. 30 capsule 5  . ondansetron (ZOFRAN) 8 MG tablet Take 1 tablet (8 mg total) by mouth 2 (two) times daily as needed. Start on the third day after cisplatin chemotherapy. 30 tablet 1  . prochlorperazine (COMPAZINE) 10 MG tablet Take 1 tablet (10 mg total) by mouth every 6 (six) hours as needed (Nausea or vomiting). 30 tablet 1  . sertraline (ZOLOFT) 50 MG tablet Take 50 mg by mouth daily.    . traMADol (ULTRAM) 50 MG tablet Take 1 tablet (50 mg total) by mouth every 6 (six) hours as needed. 30 tablet 0  . diphenhydrAMINE (BENADRYL) 50 MG capsule 50 mg Diphenhydramine (Benadryl) PO within 1 hour of the injection 1 capsule 0   No current facility-administered medications for this visit.     OBJECTIVE: Vitals:   03/20/18 0837  BP: 136/73  Pulse: 64  Resp: 18  Temp: 98.8 F (37.1 C)  SpO2: 99%     Body mass index is 21.98 kg/m.   ECOG FS:1 - Symptomatic but completely ambulatory GENERAL: Patient is a well appearing female in no acute distress HEENT:  Sclerae anicteric.  Oropharynx clear and moist. No ulcerations or evidence of oropharyngeal candidiasis. Neck is supple.  NODES:  No cervical, supraclavicular, or axillary lymphadenopathy palpated.  LUNGS:  Clear to auscultation bilaterally.  No wheezes or rhonchi. HEART:  Regular rate and rhythm. No murmur appreciated. ABDOMEN:  Soft, mildly tender, however distention is improved as compared to her last visit.  Positive, normoactive bowel sounds. No organomegaly palpated. MSK:  No focal spinal tenderness to palpation. Full range of motion bilaterally in the upper extremities. EXTREMITIES:  No peripheral edema.   SKIN:  Clear  with no obvious rashes or skin changes. No nail dyscrasia. NEURO:  Nonfocal. Well oriented.  Appropriate affect.      LAB RESULTS:  CMP     Component Value Date/Time   NA 137 03/20/2018 8250  NA 140 02/11/2017 0755   K 3.9 03/20/2018 0808   K 4.4 02/11/2017 0755   CL 99 03/20/2018 0808   CO2 25 03/20/2018 0808   CO2 26 02/11/2017 0755   GLUCOSE 91 03/20/2018 0808   GLUCOSE 93 02/11/2017 0755   BUN 15 03/20/2018 0808   BUN 7.9 02/11/2017 0755   CREATININE 0.77 03/20/2018 0808   CREATININE 0.79 01/16/2018 1240   CREATININE 0.7 02/11/2017 0755   CALCIUM 9.5 03/20/2018 0808   CALCIUM 9.6 02/11/2017 0755   PROT 8.1 03/20/2018 0808   PROT 7.5 02/11/2017 0755   ALBUMIN 4.0 03/20/2018 0808   ALBUMIN 4.4 02/11/2017 0755   AST 17 03/20/2018 0808   AST 25 01/16/2018 1240   AST 27 02/11/2017 0755   ALT 22 03/20/2018 0808   ALT 19 01/16/2018 1240   ALT 24 02/11/2017 0755   ALKPHOS 139 (H) 03/20/2018 0808   ALKPHOS 58 02/11/2017 0755   BILITOT 0.2 (L) 03/20/2018 0808   BILITOT 0.5 01/16/2018 1240   BILITOT 0.43 02/11/2017 0755   GFRNONAA >60 03/20/2018 0808   GFRNONAA >60 01/16/2018 1240   GFRAA >60 03/20/2018 0808   GFRAA >60 01/16/2018 1240    INo results found for: SPEP, UPEP  Lab Results  Component Value Date   WBC 10.5 03/20/2018   NEUTROABS 5.9 03/20/2018   HGB 11.2 (L) 03/20/2018   HCT 35.0 (L) 03/20/2018   MCV 95.4 03/20/2018   PLT 287 03/20/2018      Chemistry      Component Value Date/Time   NA 137 03/20/2018 0808   NA 140 02/11/2017 0755   K 3.9 03/20/2018 0808   K 4.4 02/11/2017 0755   CL 99 03/20/2018 0808   CO2 25 03/20/2018 0808   CO2 26 02/11/2017 0755   BUN 15 03/20/2018 0808   BUN 7.9 02/11/2017 0755   CREATININE 0.77 03/20/2018 0808   CREATININE 0.79 01/16/2018 1240   CREATININE 0.7 02/11/2017 0755      Component Value Date/Time   CALCIUM 9.5 03/20/2018 0808   CALCIUM 9.6 02/11/2017 0755   ALKPHOS 139 (H) 03/20/2018 0808   ALKPHOS  58 02/11/2017 0755   AST 17 03/20/2018 0808   AST 25 01/16/2018 1240   AST 27 02/11/2017 0755   ALT 22 03/20/2018 0808   ALT 19 01/16/2018 1240   ALT 24 02/11/2017 0755   BILITOT 0.2 (L) 03/20/2018 0808   BILITOT 0.5 01/16/2018 1240   BILITOT 0.43 02/11/2017 0755       No results found for: LABCA2  No components found for: [id number]  No results for input(s): INR in the last 168 hours.  Urinalysis    Component Value Date/Time   COLORURINE STRAW (A) 11/22/2017 0940   APPEARANCEUR CLEAR 11/22/2017 0940   LABSPEC 1.002 (L) 11/22/2017 0940   LABSPEC 1.005 10/09/2015 1239   PHURINE 7.0 11/22/2017 0940   GLUCOSEU NEGATIVE 11/22/2017 0940   GLUCOSEU Negative 10/09/2015 1239   HGBUR NEGATIVE 11/22/2017 0940   BILIRUBINUR NEGATIVE 11/22/2017 0940   BILIRUBINUR Negative 10/09/2015 1239   KETONESUR NEGATIVE 11/22/2017 0940   PROTEINUR NEGATIVE 11/22/2017 0940   UROBILINOGEN 0.2 10/09/2015 1239   NITRITE NEGATIVE 11/22/2017 0940   LEUKOCYTESUR NEGATIVE 11/22/2017 0940   LEUKOCYTESUR Negative 10/09/2015 1239    STUDIES: Ir Abdomen US Limited  Result Date: 02/20/2018 CLINICAL DATA:  Abdominal carcinomatosis.  Paracentesis requested. EXAM: LIMITED ABDOMEN ULTRASOUND FOR ASCITES TECHNIQUE: Limited ultrasound survey for ascites was performed in all four abdominal quadrants.  COMPARISON:  MR 04/06/2017 and previous FINDINGS: There is a small amount of scattered ascites. No large pocket for safe therapeutic paracentesis. IMPRESSION: Small volume ascites.  Paracentesis deferred. Electronically Signed   By: Lucrezia Europe M.D.   On: 02/20/2018 15:20   Ir Imaging Guided Port Insertion  Result Date: 03/08/2018 INDICATION: History of ovarian cancer. In need of durable intravenous access for chemotherapy administration EXAM: IMPLANTED PORT A CATH PLACEMENT WITH ULTRASOUND AND FLUOROSCOPIC GUIDANCE COMPARISON:  None. MEDICATIONS: Ancef 2 gm IV; The antibiotic was administered within an appropriate time  interval prior to skin puncture. ANESTHESIA/SEDATION: Moderate (conscious) sedation was employed during this procedure. A total of Versed 2 mg and Fentanyl 100 mcg was administered intravenously. Moderate Sedation Time: 25 minutes. The patient's level of consciousness and vital signs were monitored continuously by radiology nursing throughout the procedure under my direct supervision. CONTRAST:  None FLUOROSCOPY TIME:  36 seconds (1.7 mGy) COMPLICATIONS: None immediate. PROCEDURE: The procedure, risks, benefits, and alternatives were explained to the patient. Questions regarding the procedure were encouraged and answered. The patient understands and consents to the procedure. The right neck and chest were prepped with chlorhexidine in a sterile fashion, and a sterile drape was applied covering the operative field. Maximum barrier sterile technique with sterile gowns and gloves were used for the procedure. A timeout was performed prior to the initiation of the procedure. Local anesthesia was provided with 1% lidocaine with epinephrine. After creating a small venotomy incision, a micropuncture kit was utilized to access the internal jugular vein. Real-time ultrasound guidance was utilized for vascular access including the acquisition of a permanent ultrasound image documenting patency of the accessed vessel. The microwire was utilized to measure appropriate catheter length. A subcutaneous port pocket was then created along the upper chest wall utilizing a combination of sharp and blunt dissection. The pocket was irrigated with sterile saline. A single lumen thin power injectable port was chosen for placement. The 8 Fr catheter was tunneled from the port pocket site to the venotomy incision. The port was placed in the pocket. The external catheter was trimmed to appropriate length. At the venotomy, an 8 Fr peel-away sheath was placed over a guidewire under fluoroscopic guidance. The catheter was then placed through the  sheath and the sheath was removed. Final catheter positioning was confirmed and documented with a fluoroscopic spot radiograph. The port was accessed with a Huber needle, aspirated and flushed with heparinized saline. The venotomy site was closed with an interrupted 4-0 Vicryl suture. The port pocket incision was closed with interrupted 2-0 Vicryl suture and the skin was opposed with a running subcuticular 4-0 Vicryl suture. Dermabond and Steri-strips were applied to both incisions. Dressings were placed. The patient tolerated the procedure well without immediate post procedural complication. FINDINGS: After catheter placement, the tip lies within the superior cavoatrial junction. The catheter aspirates and flushes normally and is ready for immediate use. IMPRESSION: Successful placement of a right internal jugular approach power injectable Port-A-Cath. The catheter is ready for immediate use. Electronically Signed   By: Sandi Mariscal M.D.   On: 03/08/2018 15:37     ELIGIBLE FOR AVAILABLE RESEARCH PROTOCOL:  discussing protocols at [facility] [place]: 53 y.o. BRCA negative [place] woman status post radical tumor debulking with optimal cytoreduction (R0) 03/27/2015 for a stage IIIC, high-grade right fallopian tube carcinoma  (a) baseline CA-125 was 1226.  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)  (c) tumor is PD-L1 negative (02/24/2016)  (d) tumor is strongly estrogen receptor positive, progesterone receptor negative (02/24/2016)  (1) adjuvant chemotherapy consisted of carboplatin and paclitaxel for 6 doses, begun 04/14/2015, completed 08/11/2015  (a) paclitaxel was omitted from cycle 5 and dose reduced on cycle 6 because of neuropathy   (b) a "make up" dose of paclitaxel was given 08/25/2015  (c) last carboplatin  dose was 08/11/2015  (d) CA-125 normalized by 06/16/2015   (2) FIRST RECURRENCE: January 2018  (a) CA-125 rise beginning November 2017 led to CT scans and PET scans December 2017, all negative  (b) exploratory laparotomy 02/24/2016 showed pathologically confirmed recurrence, with miliary disease involving all examined surfaces  (c) port-site metastases noted 03/08/2016  (d) the January 2018 tumor sample was tested for the estrogen receptor and he was 80 percent positive with strong staining, progesterone receptor negative  (3) carboplatin/liposomal doxorubicin started 03/05/2016, repeated every three weeks x4, completed 06/04/2016.  (a) cycle 3 delayed one week because of neutropenia; OnPro added  (b) CA 125 normalized after cycle 3  (4) RUCAPARIB maintenance started 07/02/2016  (a) restaging 10/01/2016: Normal CA 125, negative CT of the abdomen and pelvis  (b) labs 11/24/2016 shows a rise in her CA 125-43.4, with continuing rise thereafter  (c) rucaparib discontinued October 2018 with rising CA 125  (5) anastrozole started December 23, 2016-stopped after a couple of weeks with poor tolerance  SECOND RECURRENCE: (6) Gemcitabine/Bevacizumab started on 02/04/2017  (a) gemcitabine omitted cycle 4 because of intercurrent infection  (b) gemcitabine resumed with cycle 5, with intervening rise in  Ca 125  (c) repeat CT scan of the abdomen and pelvis on 05/26/2017 does not confirm obvious disease progression  (d) cycle 5 of gemcitabine/bevacizumab delayed 1 week   (e) gemcitabine/ bevacizumab discontinued after 6 cycles, with rising CA 125 (last dose 06/24/2017)  (7) foundation one testing did not show a high mutation burden and the microsatellite status was stable.  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  These were not immediately actionable  (8) Abraxane started 07/12/2017, given weekly or weeks 1 and 2 of each 3 weeks cycle, last dose 11/15/2017 (5 cycles)  (a) cycle 2 started  08/09/2017, will be day 1 day 8 only  (b) cycle 3 scheduled to start 09/13/2017, day 1 day 8 at patient request  (c) CT of the abdomen and pelvis 11/28/2017 shows no measurable disease  (d) PET scan 01/25/2018 and MRI of the pelvis 02/03/2018 show no measurable disease  (e) patient has symptomatic ascites and a rapidly rising Ca1 25  (9) started cisplatin/gemcitabine days 1 and 8 of each 21-day cycle on 02/18/2018   PLAN: Kia is doing better today.  Her labs are stable and Kathryn Ramsey will proceed with Gemcitabine and Cisplatin today.  We will continue to keep a close eye on any peripheral neuropathy.    Veleria and I reviewed the case of her injections.  Kathryn Ramsey gets neupogen biosimilar on days 2, 3, and 4 and on day 9 at 462mg per injection.  Due to her increased bone pain and the fact that her wbc is 10.5 today, Kathryn Ramsey will only receive the neupogen biosimilar on days 2, 3, and 9 for her next cycle.    Her wrist where the gemcitabine infiltrated has some slight swelling.  Kathryn Ramsey is going to apply heat there today, which should hopefully get that area to loosen up.  The drop in the CA 125 is very encouraging, along with the improvement in her symptoms/physical exam of her symptoms.  Kathryn Ramsey can take Tramadol if Kathryn Ramsey needs it for pain, but the improvement is encouraging.    Kathryn Ramsey will return in 2 weeks for labs, f/u, and her next cycle of Cisplatin/Gemcitabine.  Kathryn Ramsey knows to call for any questions or concerns prior to her next appointment with [name].    A total of (30) minutes of face-to-face time was spent with this patient with greater than 50% of that time in counseling and care-coordination.   Wilber Bihari, NP  03/20/18 9:17 AM Medical Oncology and Hematology [facility] [address] [place],  [address] Tel. [phone]    Fax. [phone]

## 2018-03-20 NOTE — Telephone Encounter (Signed)
Per 02/03 los, waiting for approval to schedule remaining appointments. Will inform patient once appointments are made

## 2018-03-20 NOTE — Patient Instructions (Signed)
[facility] Discharge Instructions for Patients Receiving Chemotherapy  Today you received the following chemotherapy agents Gemzar and Cisplatin  To help prevent nausea and vomiting after your treatment, we encourage you to take your nausea medication as directed   If you develop nausea and vomiting that is not controlled by your nausea medication, call the clinic.   BELOW ARE SYMPTOMS THAT SHOULD BE REPORTED IMMEDIATELY:  *FEVER GREATER THAN 100.5 F  *CHILLS WITH OR WITHOUT FEVER  NAUSEA AND VOMITING THAT IS NOT CONTROLLED WITH YOUR NAUSEA MEDICATION  *UNUSUAL SHORTNESS OF BREATH  *UNUSUAL BRUISING OR BLEEDING  TENDERNESS IN MOUTH AND THROAT WITH OR WITHOUT PRESENCE OF ULCERS  *URINARY PROBLEMS  *BOWEL PROBLEMS  UNUSUAL RASH Items with * indicate a potential emergency and should be followed up as soon as possible.  Feel free to call the clinic should you have any questions or concerns. The clinic phone number is [phone].  Please show the CHEMO ALERT CARD at check-in to the Emergency Department and triage nurse.   

## 2018-03-20 NOTE — Progress Notes (Signed)
Patient reports that she "suffered" with her Zarxio injections. States she is accustomed to bone pain; however, "this was different." Complained of "severe pain" in anterior chest, jaws, back, and bilateral axilla. Pain was at max on Thursday and persisted through the weekend. States she is still having pain, but it seems to be improving.

## 2018-03-21 ENCOUNTER — Ambulatory Visit: Payer: 59

## 2018-03-21 ENCOUNTER — Telehealth: Payer: Self-pay | Admitting: Adult Health

## 2018-03-21 ENCOUNTER — Other Ambulatory Visit: Payer: Self-pay | Admitting: Oncology

## 2018-03-21 DIAGNOSIS — Z1231 Encounter for screening mammogram for malignant neoplasm of breast: Secondary | ICD-10-CM

## 2018-03-21 NOTE — Telephone Encounter (Signed)
Talked to Kathryn Ramsey about her appointments.  She noted that she has not received her schedule for 2/17 and is concerned that she will not get an appointment.  I reviewed with her that she will be able to take two weeks off to accommodate her skiing trip in 04/2018.  She would like to go ahead and get scheduled for 3/16 and 3/23 appointments.  She would also like to change from Zarxio back to [place], since her body seemed to tolerate the granix better.  She notes this was reviewed with Rob.  I let her know that I would work on the above.    Wilber Bihari, NP

## 2018-03-22 ENCOUNTER — Other Ambulatory Visit: Payer: Self-pay | Admitting: Oncology

## 2018-03-24 ENCOUNTER — Other Ambulatory Visit: Payer: Self-pay | Admitting: Oncology

## 2018-03-29 ENCOUNTER — Other Ambulatory Visit: Payer: Self-pay | Admitting: Oncology

## 2018-04-03 ENCOUNTER — Inpatient Hospital Stay: Payer: 59

## 2018-04-03 VITALS — BP 129/87 | HR 63 | Temp 98.2°F | Resp 18

## 2018-04-03 DIAGNOSIS — C762 Malignant neoplasm of abdomen: Secondary | ICD-10-CM

## 2018-04-03 DIAGNOSIS — T451X5A Adverse effect of antineoplastic and immunosuppressive drugs, initial encounter: Secondary | ICD-10-CM

## 2018-04-03 DIAGNOSIS — D701 Agranulocytosis secondary to cancer chemotherapy: Secondary | ICD-10-CM

## 2018-04-03 DIAGNOSIS — C801 Malignant (primary) neoplasm, unspecified: Principal | ICD-10-CM

## 2018-04-03 DIAGNOSIS — C5701 Malignant neoplasm of right fallopian tube: Secondary | ICD-10-CM

## 2018-04-03 DIAGNOSIS — G620 Drug-induced polyneuropathy: Secondary | ICD-10-CM

## 2018-04-03 DIAGNOSIS — Z7189 Other specified counseling: Secondary | ICD-10-CM

## 2018-04-03 DIAGNOSIS — Z5111 Encounter for antineoplastic chemotherapy: Secondary | ICD-10-CM | POA: Diagnosis not present

## 2018-04-03 DIAGNOSIS — C786 Secondary malignant neoplasm of retroperitoneum and peritoneum: Secondary | ICD-10-CM

## 2018-04-03 LAB — CBC WITH DIFFERENTIAL/PLATELET
Abs Immature Granulocytes: 0.29 10*3/uL — ABNORMAL HIGH (ref 0.00–0.07)
Basophils Absolute: 0 10*3/uL (ref 0.0–0.1)
Basophils Relative: 0 %
Eosinophils Absolute: 0 10*3/uL (ref 0.0–0.5)
Eosinophils Relative: 0 %
HCT: 29.2 % — ABNORMAL LOW (ref 36.0–46.0)
Hemoglobin: 9.4 g/dL — ABNORMAL LOW (ref 12.0–15.0)
Immature Granulocytes: 5 %
Lymphocytes Relative: 32 %
Lymphs Abs: 1.9 10*3/uL (ref 0.7–4.0)
MCH: 30.5 pg (ref 26.0–34.0)
MCHC: 32.2 g/dL (ref 30.0–36.0)
MCV: 94.8 fL (ref 80.0–100.0)
Monocytes Absolute: 0.6 10*3/uL (ref 0.1–1.0)
Monocytes Relative: 10 %
Neutro Abs: 3.2 10*3/uL (ref 1.7–7.7)
Neutrophils Relative %: 53 %
Platelets: 212 10*3/uL (ref 150–400)
RBC: 3.08 MIL/uL — ABNORMAL LOW (ref 3.87–5.11)
RDW: 14.6 % (ref 11.5–15.5)
WBC: 6 10*3/uL (ref 4.0–10.5)
nRBC: 0.3 % — ABNORMAL HIGH (ref 0.0–0.2)

## 2018-04-03 LAB — COMPREHENSIVE METABOLIC PANEL
ALT: 15 U/L (ref 0–44)
AST: 16 U/L (ref 15–41)
Albumin: 4 g/dL (ref 3.5–5.0)
Alkaline Phosphatase: 104 U/L (ref 38–126)
Anion gap: 10 (ref 5–15)
BUN: 8 mg/dL (ref 6–20)
CO2: 25 mmol/L (ref 22–32)
Calcium: 9.4 mg/dL (ref 8.9–10.3)
Chloride: 105 mmol/L (ref 98–111)
Creatinine, Ser: 0.66 mg/dL (ref 0.44–1.00)
GFR calc Af Amer: >60 mL/min (ref >60)
GFR calc non Af Amer: >60 mL/min (ref >60)
Glucose, Bld: 85 mg/dL (ref 70–99)
Potassium: 4 mmol/L (ref 3.5–5.1)
Sodium: 140 mmol/L (ref 135–145)
Total Bilirubin: 0.3 mg/dL (ref 0.3–1.2)
Total Protein: 7 g/dL (ref 6.5–8.1)

## 2018-04-03 MED ORDER — SODIUM CHLORIDE 0.9 % IV SOLN
Freq: Once | INTRAVENOUS | Status: AC
  Administered 2018-04-03: 10:00:00 via INTRAVENOUS
  Filled 2018-04-03: qty 250

## 2018-04-03 MED ORDER — SODIUM CHLORIDE 0.9 % IV SOLN
40.0000 mg/m2 | Freq: Once | INTRAVENOUS | Status: AC
  Administered 2018-04-03: 63 mg via INTRAVENOUS
  Filled 2018-04-03: qty 63

## 2018-04-03 MED ORDER — HEPARIN SOD (PORK) LOCK FLUSH 100 UNIT/ML IV SOLN
500.0000 [IU] | Freq: Once | INTRAVENOUS | Status: AC | PRN
  Administered 2018-04-03: 500 [IU]
  Filled 2018-04-03: qty 5

## 2018-04-03 MED ORDER — PALONOSETRON HCL INJECTION 0.25 MG/5ML
0.2500 mg | Freq: Once | INTRAVENOUS | Status: AC
  Administered 2018-04-03: 0.25 mg via INTRAVENOUS

## 2018-04-03 MED ORDER — SODIUM CHLORIDE 0.9 % IV SOLN
800.0000 mg/m2 | Freq: Once | INTRAVENOUS | Status: AC
  Administered 2018-04-03: 1254 mg via INTRAVENOUS
  Filled 2018-04-03: qty 32.98

## 2018-04-03 MED ORDER — SODIUM CHLORIDE 0.9% FLUSH
10.0000 mL | INTRAVENOUS | Status: DC | PRN
  Administered 2018-04-03: 10 mL
  Filled 2018-04-03: qty 10

## 2018-04-03 MED ORDER — POTASSIUM CHLORIDE 2 MEQ/ML IV SOLN
Freq: Once | INTRAVENOUS | Status: AC
  Administered 2018-04-03: 10:00:00 via INTRAVENOUS
  Filled 2018-04-03: qty 10

## 2018-04-03 MED ORDER — PALONOSETRON HCL INJECTION 0.25 MG/5ML
INTRAVENOUS | Status: AC
  Filled 2018-04-03: qty 5

## 2018-04-03 MED ORDER — SODIUM CHLORIDE 0.9 % IV SOLN
Freq: Once | INTRAVENOUS | Status: AC
  Administered 2018-04-03: 12:00:00 via INTRAVENOUS
  Filled 2018-04-03: qty 5

## 2018-04-03 NOTE — Patient Instructions (Signed)
[facility] Discharge Instructions for Patients Receiving Chemotherapy  Today you received the following chemotherapy agents Gemzar and Cisplatin  To help prevent nausea and vomiting after your treatment, we encourage you to take your nausea medication as directed   If you develop nausea and vomiting that is not controlled by your nausea medication, call the clinic.   BELOW ARE SYMPTOMS THAT SHOULD BE REPORTED IMMEDIATELY:  *FEVER GREATER THAN 100.5 F  *CHILLS WITH OR WITHOUT FEVER  NAUSEA AND VOMITING THAT IS NOT CONTROLLED WITH YOUR NAUSEA MEDICATION  *UNUSUAL SHORTNESS OF BREATH  *UNUSUAL BRUISING OR BLEEDING  TENDERNESS IN MOUTH AND THROAT WITH OR WITHOUT PRESENCE OF ULCERS  *URINARY PROBLEMS  *BOWEL PROBLEMS  UNUSUAL RASH Items with * indicate a potential emergency and should be followed up as soon as possible.  Feel free to call the clinic should you have any questions or concerns. The clinic phone number is [phone].  Please show the CHEMO ALERT CARD at check-in to the Emergency Department and triage nurse.   

## 2018-04-04 ENCOUNTER — Inpatient Hospital Stay: Payer: 59

## 2018-04-04 VITALS — BP 106/70 | HR 63 | Temp 98.2°F | Resp 18

## 2018-04-04 DIAGNOSIS — D701 Agranulocytosis secondary to cancer chemotherapy: Secondary | ICD-10-CM

## 2018-04-04 DIAGNOSIS — C762 Malignant neoplasm of abdomen: Secondary | ICD-10-CM

## 2018-04-04 DIAGNOSIS — Z5111 Encounter for antineoplastic chemotherapy: Secondary | ICD-10-CM | POA: Diagnosis not present

## 2018-04-04 DIAGNOSIS — D702 Other drug-induced agranulocytosis: Secondary | ICD-10-CM

## 2018-04-04 DIAGNOSIS — T451X5A Adverse effect of antineoplastic and immunosuppressive drugs, initial encounter: Secondary | ICD-10-CM

## 2018-04-04 LAB — CA 125: Cancer Antigen (CA) 125: 608 U/mL — ABNORMAL HIGH (ref 0.0–38.1)

## 2018-04-04 MED ORDER — TBO-FILGRASTIM 480 MCG/0.8ML ~~LOC~~ SOSY
480.0000 ug | PREFILLED_SYRINGE | Freq: Once | SUBCUTANEOUS | Status: AC
  Administered 2018-04-04: 480 ug via SUBCUTANEOUS

## 2018-04-04 MED ORDER — TBO-FILGRASTIM 480 MCG/0.8ML ~~LOC~~ SOSY
PREFILLED_SYRINGE | SUBCUTANEOUS | Status: AC
  Filled 2018-04-04: qty 0.8

## 2018-04-04 NOTE — Patient Instructions (Signed)
Tbo-Filgrastim injection What is this medicine? TBO-FILGRASTIM (T B O fil GRA stim) is a granulocyte colony-stimulating factor that stimulates the growth of neutrophils, a type of white blood cell important in the body's fight against infection. It is used to reduce the incidence of fever and infection in patients with certain types of cancer who are receiving chemotherapy that affects the bone marrow. This medicine may be used for other purposes; ask your health care provider or pharmacist if you have questions. COMMON BRAND NAME(S): Granix What should I tell my health care provider before I take this medicine? They need to know if you have any of these conditions: -bone scan or tests planned -kidney disease -sickle cell anemia -an unusual or allergic reaction to tbo-filgrastim, filgrastim, pegfilgrastim, other medicines, foods, dyes, or preservatives -pregnant or trying to get pregnant -breast-feeding How should I use this medicine? This medicine is for injection under the skin. If you get this medicine at home, you will be taught how to prepare and give this medicine. Refer to the Instructions for Use that come with your medication packaging. Use exactly as directed. Take your medicine at regular intervals. Do not take your medicine more often than directed. It is important that you put your used needles and syringes in a special sharps container. Do not put them in a trash can. If you do not have a sharps container, call your pharmacist or healthcare provider to get one. Talk to your pediatrician regarding the use of this medicine in children. While this drug may be prescribed for children as young as 1 month of age for selected conditions, precautions do apply. Overdosage: If you think you have taken too much of this medicine contact a poison control center or emergency room at once. NOTE: This medicine is only for you. Do not share this medicine with others. What if I miss a dose? It is  important not to miss your dose. Call your doctor or health care professional if you miss a dose. What may interact with this medicine? This medicine may interact with the following medications: -medicines that may cause a release of neutrophils, such as lithium This list may not describe all possible interactions. Give your health care provider a list of all the medicines, herbs, non-prescription drugs, or dietary supplements you use. Also tell them if you smoke, drink alcohol, or use illegal drugs. Some items may interact with your medicine. What should I watch for while using this medicine? You may need blood work done while you are taking this medicine. What side effects may I notice from receiving this medicine? Side effects that you should report to your doctor or health care professional as soon as possible: -allergic reactions like skin rash, itching or hives, swelling of the face, lips, or tongue -back pain -blood in the urine -dark urine -dizziness -fast heartbeat -feeling faint -shortness of breath or breathing problems -signs and symptoms of infection like fever or chills; cough; or sore throat -signs and symptoms of kidney injury like trouble passing urine or change in the amount of urine -stomach or side pain, or pain at the shoulder -sweating -swelling of the legs, ankles, or abdomen -tiredness Side effects that usually do not require medical attention (report to your doctor or health care professional if they continue or are bothersome): -bone pain -diarrhea -headache -muscle pain -vomiting This list may not describe all possible side effects. Call your doctor for medical advice about side effects. You may report side effects to FDA at   1-800-FDA-1088. Where should I keep my medicine? Keep out of the reach of children. Store in a refrigerator between 2 and 8 degrees C (36 and 46 degrees F). Keep in carton to protect from light. Throw away this medicine if it is left out  of the refrigerator for more than 5 consecutive days. Throw away any unused medicine after the expiration date. NOTE: This sheet is a summary. It may not cover all possible information. If you have questions about this medicine, talk to your doctor, pharmacist, or health care provider.  2019 [employer] (2016-09-21 16:56:18)  

## 2018-04-05 ENCOUNTER — Inpatient Hospital Stay: Payer: 59

## 2018-04-05 VITALS — BP 101/56 | HR 68 | Temp 97.9°F | Resp 18

## 2018-04-05 DIAGNOSIS — T451X5A Adverse effect of antineoplastic and immunosuppressive drugs, initial encounter: Secondary | ICD-10-CM

## 2018-04-05 DIAGNOSIS — C762 Malignant neoplasm of abdomen: Secondary | ICD-10-CM

## 2018-04-05 DIAGNOSIS — Z5111 Encounter for antineoplastic chemotherapy: Secondary | ICD-10-CM | POA: Diagnosis not present

## 2018-04-05 DIAGNOSIS — D702 Other drug-induced agranulocytosis: Secondary | ICD-10-CM

## 2018-04-05 DIAGNOSIS — D701 Agranulocytosis secondary to cancer chemotherapy: Secondary | ICD-10-CM

## 2018-04-05 MED ORDER — TBO-FILGRASTIM 480 MCG/0.8ML ~~LOC~~ SOSY
PREFILLED_SYRINGE | SUBCUTANEOUS | Status: AC
  Filled 2018-04-05: qty 0.8

## 2018-04-05 MED ORDER — TBO-FILGRASTIM 480 MCG/0.8ML ~~LOC~~ SOSY
480.0000 ug | PREFILLED_SYRINGE | Freq: Once | SUBCUTANEOUS | Status: AC
  Administered 2018-04-05: 480 ug via SUBCUTANEOUS

## 2018-04-05 NOTE — Patient Instructions (Signed)
Tbo-Filgrastim injection What is this medicine? TBO-FILGRASTIM (T B O fil GRA stim) is a granulocyte colony-stimulating factor that stimulates the growth of neutrophils, a type of white blood cell important in the body's fight against infection. It is used to reduce the incidence of fever and infection in patients with certain types of cancer who are receiving chemotherapy that affects the bone marrow. This medicine may be used for other purposes; ask your health care provider or pharmacist if you have questions. COMMON BRAND NAME(S): Granix What should I tell my health care provider before I take this medicine? They need to know if you have any of these conditions: -bone scan or tests planned -kidney disease -sickle cell anemia -an unusual or allergic reaction to tbo-filgrastim, filgrastim, pegfilgrastim, other medicines, foods, dyes, or preservatives -pregnant or trying to get pregnant -breast-feeding How should I use this medicine? This medicine is for injection under the skin. If you get this medicine at home, you will be taught how to prepare and give this medicine. Refer to the Instructions for Use that come with your medication packaging. Use exactly as directed. Take your medicine at regular intervals. Do not take your medicine more often than directed. It is important that you put your used needles and syringes in a special sharps container. Do not put them in a trash can. If you do not have a sharps container, call your pharmacist or healthcare provider to get one. Talk to your pediatrician regarding the use of this medicine in children. While this drug may be prescribed for children as young as 1 month of age for selected conditions, precautions do apply. Overdosage: If you think you have taken too much of this medicine contact a poison control center or emergency room at once. NOTE: This medicine is only for you. Do not share this medicine with others. What if I miss a dose? It is  important not to miss your dose. Call your doctor or health care professional if you miss a dose. What may interact with this medicine? This medicine may interact with the following medications: -medicines that may cause a release of neutrophils, such as lithium This list may not describe all possible interactions. Give your health care provider a list of all the medicines, herbs, non-prescription drugs, or dietary supplements you use. Also tell them if you smoke, drink alcohol, or use illegal drugs. Some items may interact with your medicine. What should I watch for while using this medicine? You may need blood work done while you are taking this medicine. What side effects may I notice from receiving this medicine? Side effects that you should report to your doctor or health care professional as soon as possible: -allergic reactions like skin rash, itching or hives, swelling of the face, lips, or tongue -back pain -blood in the urine -dark urine -dizziness -fast heartbeat -feeling faint -shortness of breath or breathing problems -signs and symptoms of infection like fever or chills; cough; or sore throat -signs and symptoms of kidney injury like trouble passing urine or change in the amount of urine -stomach or side pain, or pain at the shoulder -sweating -swelling of the legs, ankles, or abdomen -tiredness Side effects that usually do not require medical attention (report to your doctor or health care professional if they continue or are bothersome): -bone pain -diarrhea -headache -muscle pain -vomiting This list may not describe all possible side effects. Call your doctor for medical advice about side effects. You may report side effects to FDA at   1-800-FDA-1088. Where should I keep my medicine? Keep out of the reach of children. Store in a refrigerator between 2 and 8 degrees C (36 and 46 degrees F). Keep in carton to protect from light. Throw away this medicine if it is left out  of the refrigerator for more than 5 consecutive days. Throw away any unused medicine after the expiration date. NOTE: This sheet is a summary. It may not cover all possible information. If you have questions about this medicine, talk to your doctor, pharmacist, or health care provider.  2019 [employer] (2016-09-21 16:56:18)  

## 2018-04-06 ENCOUNTER — Telehealth: Payer: Self-pay

## 2018-04-06 ENCOUNTER — Inpatient Hospital Stay: Payer: 59 | Admitting: Adult Health

## 2018-04-06 NOTE — Progress Notes (Deleted)
[place]  Telephone:([phone]) [phone] Fax:(336) [phone]     ID: Kathryn Ramsey DOB: 1965-04-24  MR#: [id number]  BPZ#:[id number]  Patient Care Team: Harlan Stains, MD as PCP - General (Family Medicine) [name], MD as Consulting Physician (Obstetrics and Gynecology) Juanita Craver, MD as Consulting Physician (Gastroenterology) Servando Salina, MD as Consulting Physician (Obstetrics and Gynecology) Nickie Retort, MD as Consulting Physician (Urology) Gillis Ends, MD as Referring Physician (Obstetrics and Gynecology) OTHER MD: Festus Aloe [phone])   CHIEF COMPLAINT:  ovarian cancer   CURRENT TREATMENT: Cisplatin, gemcitabine   INTERVAL HISTORY: Kathryn Ramsey returns today for follow-up and treatment of her recurrent ovarian cancer. She is accompanied by her husband.  She continues on cisplatin/gemcitabine and notes her stomach is starting to feel better.  REVIEW OF SYSTEMS: Kathryn Ramsey  HISTORY OF PRESENT ILLNESS: From Dr. Serita Grit original intake note 03/17/2015:  "Kathryn Ramsey is a very pleasant G4P4 who is seen in consultation at the request of Dr Collene Mares and Dr Garwin Brothers for peritoneal carcinomatosis. The patient has a history of a workup by urology for gross hematuria which included a pelvic examine was suggested for extrinsic mass effect on the bladder on cystoscopy. Cystoscopy took place on in December 2016. The patient denies abdominal pain bloating early satiety or abdominal distention.  On 08/03/2015 at [facility] she underwent a CT scan of the abdomen and pelvis as ordered by Dr Baruch Gouty. This revealed a 1.4 cm low attenuation lesion on the posterior right hepatic lobe suggestive of a hemangioma. Status post hysterectomy. No ovarian masses. Moderate ascites. Omental caking seen in the lateral left abdomen and pelvis. Peritoneal nodularity in the left lateral pelvic cul-de-sac. No gross extrinsic compression on the bladder was  identified on imaging.  The patient was then seen by her gastroenterologist, Dr. Collene Mares, who performed a colonoscopy which was unremarkable.  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 She was treated adjuvantly with carboplatin and paclitaxel, as detailed below. Her CA 125 dropped from 1226 on 03/20/2015 to 26.1 by 06/16/2015.  Her subsequent history is as detailed below.  I PAST MEDICAL HISTORY: Past Medical History:  Diagnosis Date  . Acute sensory neuropathy 06/26/2015  . Allergy   . Anemia   . Asthma    illness induced asthma  . Blood transfusion without reported diagnosis    at age 68 years old D/T surgery to femur being crushed  . 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 Heart murmur    pt, states had a "working heart murmur"  . History of kidney stones   . Neutropenia, drug-induced ([place]) 03/10/2018  . PONV (postoperative nausea and vomiting)   . Skin cancer     PAST SURGICAL HISTORY: Past Surgical History:  Procedure Laterality Date  . 3 laporscopic proceedures    . ABDOMINAL HYSTERECTOMY     2010  . CHOLECYSTECTOMY     2010  . DILATION AND CURETTAGE OF UTERUS     1997  . IR IMAGING GUIDED PORT INSERTION  03/08/2018  . LAPAROSCOPY N/A 03/27/2015   Procedure: DIAGNOSTIC LAPAROSCOPY ;  Surgeon: Terrence Dupont  Denman George, MD;  Location: [facility];  Service: Gynecology;  Laterality: N/A;  . LAPAROSCOPY N/A 02/24/2016   Procedure: LAPAROSCOPY DIAGNOSTIC WITH PERITONEAL WASHINGS AND PERITONEAL BIOPSY;  Surgeon: [name], MD;  Location: [facility];  Service: Gynecology;  Laterality: N/A;    . LAPAROTOMY N/A 03/27/2015   Procedure: EXPLORATORY LAPAROTOMY, BILATERAL SALPINGO OOPHORECTOMY, OMENTECTOMY, RADICAL TUMOR DEBULKING;  Surgeon: [name], MD;  Location: [facility];  Service: Gynecology;  Laterality: N/A;  . sinus surgery 1994      FAMILY HISTORY Family History  Problem Relation Age of Onset  . Hypertension Father   . Skin cancer Father        nonmelanoma skin cancers in his late 26s  . Non-Hodgkin's lymphoma Maternal Aunt        dx. 19s; smoker  . Stroke Maternal Grandmother   . Other Mother        benign meningioma dx. early-mid-70s; hysterectomy in her late 40s for heavy periods - still has ovaries  . Other Son        one son with pre-cancerous skin findings  . Other Daughter        cysts on ovaries and hx of heavy periods  . Other Sister        hysterectomy for cysts - still has ovaries  . Heart attack Maternal Grandfather   . Heart attack Paternal Grandfather   . Renal cancer Maternal Aunt 60       smoker  . Breast cancer Maternal Aunt 78  . Other Maternal Aunt        dx. benign brain tumor (meningioma) at 88; 2nd benign brain tumor in her late 76s; hx of radical hysterectomy at age 43  . Brain cancer Other        NOS tumor  The patient's parents are both living, in their late 84s as of January 2018. The patient has one brother, 3 sisters. There is no history of ovarian cancer in the family. One maternal aunt had breast and kidney cancer, another had non-Hodgkin's lymphoma.  GYNECOLOGIC HISTORY:  No LMP recorded. Patient has had a hysterectomy. 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 Her husband Kathryn Ramsey is a Engineer, maintenance (IT). Their children are 2, 23, 68 and 66 y/o as of JAN 2018. The oldest works as an Electrical engineer in the [employer] in [place]. The other 3 children live in [place], the youngest currently attending [employer]. The patient has no grandchildren. She  attends [employer]     ADVANCED DIRECTIVES:    HEALTH MAINTENANCE: Social History   Tobacco Use  . Smoking status: Never Smoker  . Smokeless tobacco: Never Used  Substance Use Topics  . Alcohol use: Yes    Comment:  3 glasses wine or beer/week  . Drug use: No     Colonoscopy:2016  PAP: s/p hyst  Bone density:   Allergies  Allergen Reactions  . Bee Venom Shortness Of Breath    swelling  . Compazine [Prochlorperazine Edisylate] Anaphylaxis, Anxiety and Palpitations  . Sulfa Antibiotics Shortness Of Breath    Vomit , diarrhea , hives  . Zarxio [Filgrastim]     Current Outpatient Medications  Medication Sig Dispense Refill  . dexamethasone (DECADRON) 4 MG tablet 2 TABS BY MOUTH ONCE A DAY ON THE DAY AFTER CISPLATIN CHEMO, THEN 2 TABS 2X/DAY FOR 2 DAYS. 30 tablet 0  . diphenhydrAMINE (BENADRYL)  50 MG capsule 50 mg Diphenhydramine (Benadryl) PO within 1 hour of the injection 1 capsule 0  . fluticasone (FLONASE) 50 MCG/ACT nasal spray Place 1 spray into both nostrils 2 (two) times daily. (Patient taking differently: Place 1 spray into both nostrils as needed. ) 16 g 2  . gabapentin (NEURONTIN) 100 MG capsule Take 1 capsule (100 mg total) by mouth 3 (three) times daily. 270 capsule 3  . lidocaine-prilocaine (EMLA) cream APPLY TOPICALLY AS NEEDED. 30 g 0  . loratadine (CLARITIN) 10 MG tablet Take 10 mg by mouth as needed.     . magnesium oxide (MAG-OX) 400 (241.3 Mg) MG tablet Take 1 tablet (400 mg total) by mouth daily. 60 tablet 3  . omeprazole (PRILOSEC) 40 MG capsule Take 1 capsule (40 mg total) by mouth daily. 30 capsule 5  . ondansetron (ZOFRAN) 8 MG tablet Take 1 tablet (8 mg total) by mouth 2 (two) times daily as needed. Start on the third day after cisplatin chemotherapy. 30 tablet 1  . prochlorperazine (COMPAZINE) 10 MG tablet Take 1 tablet (10 mg total) by mouth every 6 (six) hours as needed (Nausea or vomiting). 30 tablet 1  . sertraline (ZOLOFT) 50 MG  tablet TAKE 1 TABLET ONCE DAILY. 30 tablet 2  . traMADol (ULTRAM) 50 MG tablet Take 1 tablet (50 mg total) by mouth every 6 (six) hours as needed. 30 tablet 0   No current facility-administered medications for this visit.     OBJECTIVE: There were no vitals filed for this visit.   There is no height or weight on file to calculate BMI.   ECOG FS:1 - Symptomatic but completely ambulatory GENERAL: Patient is a well appearing female in no acute distress HEENT:  Sclerae anicteric.  Oropharynx clear and moist. No ulcerations or evidence of oropharyngeal candidiasis. Neck is supple.  NODES:  No cervical, supraclavicular, or axillary lymphadenopathy palpated.  LUNGS:  Clear to auscultation bilaterally.  No wheezes or rhonchi. HEART:  Regular rate and rhythm. No murmur appreciated. ABDOMEN:  Soft, mildly tender, however distention is improved as compared to her last visit.  Positive, normoactive bowel sounds. No organomegaly palpated. MSK:  No focal spinal tenderness to palpation. Full range of motion bilaterally in the upper extremities. EXTREMITIES:  No peripheral edema.   SKIN:  Clear with no obvious rashes or skin changes. No nail dyscrasia. NEURO:  Nonfocal. Well oriented.  Appropriate affect.      LAB RESULTS:  CMP     Component Value Date/Time   NA 140 04/03/2018 0900   NA 140 02/11/2017 0755   K 4.0 04/03/2018 0900   K 4.4 02/11/2017 0755   CL 105 04/03/2018 0900   CO2 25 04/03/2018 0900   CO2 26 02/11/2017 0755   GLUCOSE 85 04/03/2018 0900   GLUCOSE 93 02/11/2017 0755   BUN 8 04/03/2018 0900   BUN 7.9 02/11/2017 0755   CREATININE 0.66 04/03/2018 0900   CREATININE 0.79 01/16/2018 1240   CREATININE 0.7 02/11/2017 0755   CALCIUM 9.4 04/03/2018 0900   CALCIUM 9.6 02/11/2017 0755   PROT 7.0 04/03/2018 0900   PROT 7.5 02/11/2017 0755   ALBUMIN 4.0 04/03/2018 0900   ALBUMIN 4.4 02/11/2017 0755   AST 16 04/03/2018 0900   AST 25 01/16/2018 1240   AST 27 02/11/2017 0755    ALT 15 04/03/2018 0900   ALT 19 01/16/2018 1240   ALT 24 02/11/2017 0755   ALKPHOS 104 04/03/2018 0900   ALKPHOS 58 02/11/2017 0755  BILITOT 0.3 04/03/2018 0900   BILITOT 0.5 01/16/2018 1240   BILITOT 0.43 02/11/2017 0755   GFRNONAA >60 04/03/2018 0900   GFRNONAA >60 01/16/2018 1240   GFRAA >60 04/03/2018 0900   GFRAA >60 01/16/2018 1240    INo results found for: SPEP, UPEP  Lab Results  Component Value Date   WBC 6.0 04/03/2018   NEUTROABS 3.2 04/03/2018   HGB 9.4 (L) 04/03/2018   HCT 29.2 (L) 04/03/2018   MCV 94.8 04/03/2018   PLT 212 04/03/2018      Chemistry      Component Value Date/Time   NA 140 04/03/2018 0900   NA 140 02/11/2017 0755   K 4.0 04/03/2018 0900   K 4.4 02/11/2017 0755   CL 105 04/03/2018 0900   CO2 25 04/03/2018 0900   CO2 26 02/11/2017 0755   BUN 8 04/03/2018 0900   BUN 7.9 02/11/2017 0755   CREATININE 0.66 04/03/2018 0900   CREATININE 0.79 01/16/2018 1240   CREATININE 0.7 02/11/2017 0755      Component Value Date/Time   CALCIUM 9.4 04/03/2018 0900   CALCIUM 9.6 02/11/2017 0755   ALKPHOS 104 04/03/2018 0900   ALKPHOS 58 02/11/2017 0755   AST 16 04/03/2018 0900   AST 25 01/16/2018 1240   AST 27 02/11/2017 0755   ALT 15 04/03/2018 0900   ALT 19 01/16/2018 1240   ALT 24 02/11/2017 0755   BILITOT 0.3 04/03/2018 0900   BILITOT 0.5 01/16/2018 1240   BILITOT 0.43 02/11/2017 0755       No results found for: LABCA2  No components found for: JOINO676  No results for input(s): INR in the last 168 hours.  Urinalysis    Component Value Date/Time   COLORURINE STRAW (A) 11/22/2017 0940   APPEARANCEUR CLEAR 11/22/2017 0940   LABSPEC 1.002 (L) 11/22/2017 0940   LABSPEC 1.005 10/09/2015 1239   PHURINE 7.0 11/22/2017 0940   GLUCOSEU NEGATIVE 11/22/2017 0940   GLUCOSEU Negative 10/09/2015 1239   HGBUR NEGATIVE 11/22/2017 0940   BILIRUBINUR NEGATIVE 11/22/2017 0940   BILIRUBINUR Negative 10/09/2015 1239   KETONESUR NEGATIVE 11/22/2017  0940   PROTEINUR NEGATIVE 11/22/2017 0940   UROBILINOGEN 0.2 10/09/2015 1239   NITRITE NEGATIVE 11/22/2017 0940   LEUKOCYTESUR NEGATIVE 11/22/2017 0940   LEUKOCYTESUR Negative 10/09/2015 1239    STUDIES: Ir Imaging Guided Port Insertion  Result Date: 03/08/2018 INDICATION: History of ovarian cancer. In need of durable intravenous access for chemotherapy administration EXAM: IMPLANTED PORT A CATH PLACEMENT WITH ULTRASOUND AND FLUOROSCOPIC GUIDANCE COMPARISON:  None. MEDICATIONS: Ancef 2 gm IV; The antibiotic was administered within an appropriate time interval prior to skin puncture. ANESTHESIA/SEDATION: Moderate (conscious) sedation was employed during this procedure. A total of Versed 2 mg and Fentanyl 100 mcg was administered intravenously. Moderate Sedation Time: 25 minutes. The patient's level of consciousness and vital signs were monitored continuously by radiology nursing throughout the procedure under my direct supervision. CONTRAST:  None FLUOROSCOPY TIME:  36 seconds (1.7 mGy) COMPLICATIONS: None immediate. PROCEDURE: The procedure, risks, benefits, and alternatives were explained to the patient. Questions regarding the procedure were encouraged and answered. The patient understands and consents to the procedure. The right neck and chest were prepped with chlorhexidine in a sterile fashion, and a sterile drape was applied covering the operative field. Maximum barrier sterile technique with sterile gowns and gloves were used for the procedure. A timeout was performed prior to the initiation of the procedure. Local anesthesia was provided with 1% lidocaine with epinephrine.  After creating a small venotomy incision, a micropuncture kit was utilized to access the internal jugular vein. Real-time ultrasound guidance was utilized for vascular access including the acquisition of a permanent ultrasound image documenting patency of the accessed vessel. The microwire was utilized to measure appropriate  catheter length. A subcutaneous port pocket was then created along the upper chest wall utilizing a combination of sharp and blunt dissection. The pocket was irrigated with sterile saline. A single lumen thin power injectable port was chosen for placement. The 8 Fr catheter was tunneled from the port pocket site to the venotomy incision. The port was placed in the pocket. The external catheter was trimmed to appropriate length. At the venotomy, an 8 Fr peel-away sheath was placed over a guidewire under fluoroscopic guidance. The catheter was then placed through the sheath and the sheath was removed. Final catheter positioning was confirmed and documented with a fluoroscopic spot radiograph. The port was accessed with a Huber needle, aspirated and flushed with heparinized saline. The venotomy site was closed with an interrupted 4-0 Vicryl suture. The port pocket incision was closed with interrupted 2-0 Vicryl suture and the skin was opposed with a running subcuticular 4-0 Vicryl suture. Dermabond and Steri-strips were applied to both incisions. Dressings were placed. The patient tolerated the procedure well without immediate post procedural complication. FINDINGS: After catheter placement, the tip lies within the superior cavoatrial junction. The catheter aspirates and flushes normally and is ready for immediate use. IMPRESSION: Successful placement of a right internal jugular approach power injectable Port-A-Cath. The catheter is ready for immediate use. Electronically Signed   By: Sandi Mariscal M.D.   On: 03/08/2018 15:37     ELIGIBLE FOR AVAILABLE RESEARCH PROTOCOL:  discussing protocols at [facility] at [place]: 53 y.o. BRCA negative [place] woman status post radical tumor debulking with optimal cytoreduction (R0) 03/27/2015 for a stage IIIC, high-grade right fallopian tube carcinoma  (a) baseline CA-125 was 1226.  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)  (c) tumor is PD-L1 negative (02/24/2016)  (d) tumor is strongly estrogen receptor positive, progesterone receptor negative (02/24/2016)  (1) adjuvant chemotherapy consisted of carboplatin and paclitaxel for 6 doses, begun 04/14/2015, completed 08/11/2015  (a) paclitaxel was omitted from cycle 5 and dose reduced on cycle 6 because of neuropathy   (b) a "make up" dose of paclitaxel was given 08/25/2015  (c) last carboplatin dose was 08/11/2015  (d) CA-125 normalized by 06/16/2015   (2) FIRST RECURRENCE: January 2018  (a) CA-125 rise beginning November 2017 led to CT scans and PET scans December 2017, all negative  (b) exploratory laparotomy 02/24/2016 showed pathologically confirmed recurrence, with miliary disease involving all examined surfaces  (c) port-site metastases noted 03/08/2016  (d) the January 2018 tumor sample was tested for the estrogen receptor and he was 80 percent positive with strong staining, progesterone receptor negative  (3) carboplatin/liposomal doxorubicin started 03/05/2016, repeated every three weeks x4, completed 06/04/2016.  (a) cycle 3 delayed one week because of neutropenia; OnPro added  (b) CA 125 normalized after cycle 3  (4) RUCAPARIB maintenance started 07/02/2016  (a) restaging 10/01/2016: Normal CA 125, negative CT of the abdomen and pelvis  (b) labs 11/24/2016 shows a rise in her CA 125-43.4, with continuing rise thereafter  (c) rucaparib discontinued October 2018 with rising CA 125  (5) anastrozole started December 23, 2016-stopped  after a couple of weeks with poor tolerance  SECOND RECURRENCE: (6) Gemcitabine/Bevacizumab started on 02/04/2017  (a) gemcitabine omitted cycle 4 because of intercurrent infection  (b) gemcitabine resumed with cycle 5, with intervening rise in  Ca 125  (c) repeat CT scan of the  abdomen and pelvis on 05/26/2017 does not confirm obvious disease progression  (d) cycle 5 of gemcitabine/bevacizumab delayed 1 week   (e) gemcitabine/ bevacizumab discontinued after 6 cycles, with rising CA 125 (last dose 06/24/2017)  (7) foundation one testing did not show a high mutation burden and the microsatellite status was stable.  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  These were not immediately actionable  (8) Abraxane started 07/12/2017, given weekly or weeks 1 and 2 of each 3 weeks cycle, last dose 11/15/2017 (5 cycles)  (a) cycle 2 started 08/09/2017, will be day 1 day 8 only  (b) cycle 3 scheduled to start 09/13/2017, day 1 day 8 at patient request  (c) CT of the abdomen and pelvis 11/28/2017 shows no measurable disease  (d) PET scan 01/25/2018 and MRI of the pelvis 02/03/2018 show no measurable disease  (e) patient has symptomatic ascites and a rapidly rising Ca1 25  (9) started cisplatin/gemcitabine days 1 and 8 of each 21-day cycle on 02/18/2018   PLAN: [place] is  She will return in 2 weeks for labs, f/u, and her next cycle of Cisplatin/Gemcitabine.  She knows to call for any questions or concerns prior to her next appointment with [name].    A total of (30) minutes of face-to-face time was spent with this patient with greater than 50% of that time in counseling and care-coordination.   Kathryn Bihari, NP  04/06/18 8:36 AM Medical Oncology and Hematology [facility] [address] [place], [place] [address] Tel. [phone]    Fax. [phone]

## 2018-04-06 NOTE — Telephone Encounter (Signed)
Patient LVM stating that "I cannot make it in today.  I called earlier in the week to cancel but I still see the appointment on there (my chart)."  She said to tell NP she is "doing well."  The reason she can't come is because she is going out of town to see her daughter for a few days .Patient  Kathryn Ramsey that she saw her CA marker.

## 2018-04-10 ENCOUNTER — Inpatient Hospital Stay: Payer: 59

## 2018-04-10 ENCOUNTER — Other Ambulatory Visit: Payer: Self-pay | Admitting: Oncology

## 2018-04-10 VITALS — BP 113/74 | HR 64 | Temp 98.6°F | Resp 18

## 2018-04-10 DIAGNOSIS — Z7189 Other specified counseling: Secondary | ICD-10-CM

## 2018-04-10 DIAGNOSIS — C762 Malignant neoplasm of abdomen: Secondary | ICD-10-CM

## 2018-04-10 DIAGNOSIS — C786 Secondary malignant neoplasm of retroperitoneum and peritoneum: Secondary | ICD-10-CM

## 2018-04-10 DIAGNOSIS — C5701 Malignant neoplasm of right fallopian tube: Secondary | ICD-10-CM

## 2018-04-10 DIAGNOSIS — C801 Malignant (primary) neoplasm, unspecified: Principal | ICD-10-CM

## 2018-04-10 DIAGNOSIS — Z95828 Presence of other vascular implants and grafts: Secondary | ICD-10-CM

## 2018-04-10 DIAGNOSIS — Z5111 Encounter for antineoplastic chemotherapy: Secondary | ICD-10-CM | POA: Diagnosis not present

## 2018-04-10 LAB — COMPREHENSIVE METABOLIC PANEL
ALT: 27 U/L (ref 0–44)
AST: 20 U/L (ref 15–41)
Albumin: 4.3 g/dL (ref 3.5–5.0)
Alkaline Phosphatase: 96 U/L (ref 38–126)
Anion gap: 10 (ref 5–15)
BUN: 11 mg/dL (ref 6–20)
CO2: 24 mmol/L (ref 22–32)
Calcium: 9.3 mg/dL (ref 8.9–10.3)
Chloride: 101 mmol/L (ref 98–111)
Creatinine, Ser: 0.69 mg/dL (ref 0.44–1.00)
GFR calc Af Amer: >60 mL/min (ref >60)
GFR calc non Af Amer: >60 mL/min (ref >60)
Glucose, Bld: 87 mg/dL (ref 70–99)
Potassium: 4.3 mmol/L (ref 3.5–5.1)
Sodium: 135 mmol/L (ref 135–145)
Total Bilirubin: 0.4 mg/dL (ref 0.3–1.2)
Total Protein: 7.4 g/dL (ref 6.5–8.1)

## 2018-04-10 LAB — CBC WITH DIFFERENTIAL/PLATELET
Abs Immature Granulocytes: 0.16 10*3/uL — ABNORMAL HIGH (ref 0.00–0.07)
Basophils Absolute: 0 10*3/uL (ref 0.0–0.1)
Basophils Relative: 0 %
Eosinophils Absolute: 0 10*3/uL (ref 0.0–0.5)
Eosinophils Relative: 0 %
HCT: 30.3 % — ABNORMAL LOW (ref 36.0–46.0)
Hemoglobin: 9.7 g/dL — ABNORMAL LOW (ref 12.0–15.0)
Immature Granulocytes: 4 %
Lymphocytes Relative: 43 %
Lymphs Abs: 1.9 10*3/uL (ref 0.7–4.0)
MCH: 30 pg (ref 26.0–34.0)
MCHC: 32 g/dL (ref 30.0–36.0)
MCV: 93.8 fL (ref 80.0–100.0)
Monocytes Absolute: 0.6 10*3/uL (ref 0.1–1.0)
Monocytes Relative: 13 %
Neutro Abs: 1.8 10*3/uL (ref 1.7–7.7)
Neutrophils Relative %: 40 %
Platelets: 320 10*3/uL (ref 150–400)
RBC: 3.23 MIL/uL — ABNORMAL LOW (ref 3.87–5.11)
RDW: 14.9 % (ref 11.5–15.5)
WBC: 4.4 10*3/uL (ref 4.0–10.5)
nRBC: 0.5 % — ABNORMAL HIGH (ref 0.0–0.2)

## 2018-04-10 MED ORDER — PALONOSETRON HCL INJECTION 0.25 MG/5ML
INTRAVENOUS | Status: AC
  Filled 2018-04-10: qty 5

## 2018-04-10 MED ORDER — SODIUM CHLORIDE 0.9% FLUSH
10.0000 mL | INTRAVENOUS | Status: DC | PRN
  Administered 2018-04-10: 10 mL
  Filled 2018-04-10: qty 10

## 2018-04-10 MED ORDER — POTASSIUM CHLORIDE 2 MEQ/ML IV SOLN
Freq: Once | INTRAVENOUS | Status: AC
  Administered 2018-04-10: 10:00:00 via INTRAVENOUS
  Filled 2018-04-10: qty 10

## 2018-04-10 MED ORDER — PEGFILGRASTIM 6 MG/0.6ML ~~LOC~~ PSKT
6.0000 mg | PREFILLED_SYRINGE | Freq: Once | SUBCUTANEOUS | Status: AC
  Administered 2018-04-10: 6 mg via SUBCUTANEOUS

## 2018-04-10 MED ORDER — SODIUM CHLORIDE 0.9 % IV SOLN
Freq: Once | INTRAVENOUS | Status: DC
  Filled 2018-04-10: qty 250

## 2018-04-10 MED ORDER — SODIUM CHLORIDE 0.9 % IV SOLN
Freq: Once | INTRAVENOUS | Status: AC
  Administered 2018-04-10: 09:00:00 via INTRAVENOUS
  Filled 2018-04-10: qty 250

## 2018-04-10 MED ORDER — HEPARIN SOD (PORK) LOCK FLUSH 100 UNIT/ML IV SOLN
500.0000 [IU] | Freq: Once | INTRAVENOUS | Status: AC | PRN
  Administered 2018-04-10: 500 [IU]
  Filled 2018-04-10: qty 5

## 2018-04-10 MED ORDER — PEGFILGRASTIM 6 MG/0.6ML ~~LOC~~ PSKT
PREFILLED_SYRINGE | SUBCUTANEOUS | Status: AC
  Filled 2018-04-10: qty 0.6

## 2018-04-10 MED ORDER — SODIUM CHLORIDE 0.9 % IV SOLN
Freq: Once | INTRAVENOUS | Status: AC
  Administered 2018-04-10: 12:00:00 via INTRAVENOUS
  Filled 2018-04-10: qty 5

## 2018-04-10 MED ORDER — PALONOSETRON HCL INJECTION 0.25 MG/5ML
0.2500 mg | Freq: Once | INTRAVENOUS | Status: AC
  Administered 2018-04-10: 0.25 mg via INTRAVENOUS

## 2018-04-10 MED ORDER — SODIUM CHLORIDE 0.9% FLUSH
10.0000 mL | INTRAVENOUS | Status: DC | PRN
  Administered 2018-04-10: 10 mL via INTRAVENOUS
  Filled 2018-04-10: qty 10

## 2018-04-10 MED ORDER — SODIUM CHLORIDE 0.9 % IV SOLN
800.0000 mg/m2 | Freq: Once | INTRAVENOUS | Status: AC
  Administered 2018-04-10: 1254 mg via INTRAVENOUS
  Filled 2018-04-10: qty 32.98

## 2018-04-10 MED ORDER — SODIUM CHLORIDE 0.9 % IV SOLN
40.0000 mg/m2 | Freq: Once | INTRAVENOUS | Status: AC
  Administered 2018-04-10: 63 mg via INTRAVENOUS
  Filled 2018-04-10: qty 63

## 2018-04-10 NOTE — Patient Instructions (Addendum)
[facility] Discharge Instructions for Patients Receiving Chemotherapy  Removed Onpro after 8:00pm on Tuesday, 04/11/18.  Today you received the following chemotherapy agents:  Gemzar and Cisplatin.  To help prevent nausea and vomiting after your treatment, we encourage you to take your nausea medication as directed.   If you develop nausea and vomiting that is not controlled by your nausea medication, call the clinic.   BELOW ARE SYMPTOMS THAT SHOULD BE REPORTED IMMEDIATELY:  *FEVER GREATER THAN 100.5 F  *CHILLS WITH OR WITHOUT FEVER  NAUSEA AND VOMITING THAT IS NOT CONTROLLED WITH YOUR NAUSEA MEDICATION  *UNUSUAL SHORTNESS OF BREATH  *UNUSUAL BRUISING OR BLEEDING  TENDERNESS IN MOUTH AND THROAT WITH OR WITHOUT PRESENCE OF ULCERS  *URINARY PROBLEMS  *BOWEL PROBLEMS  UNUSUAL RASH Items with * indicate a potential emergency and should be followed up as soon as possible.  Feel free to call the clinic should you have any questions or concerns. The clinic phone number is [phone].  Please show the CHEMO ALERT CARD at check-in to the Emergency Department and triage nurse.   

## 2018-04-11 ENCOUNTER — Telehealth: Payer: Self-pay | Admitting: Oncology

## 2018-04-11 ENCOUNTER — Ambulatory Visit: Payer: 59

## 2018-04-11 NOTE — Telephone Encounter (Signed)
Scheduled appt per 2/25 sch message - unable to reach patient - left message with appt date and time

## 2018-04-12 ENCOUNTER — Ambulatory Visit: Payer: 59

## 2018-04-13 ENCOUNTER — Ambulatory Visit: Payer: 59

## 2018-04-17 ENCOUNTER — Other Ambulatory Visit: Payer: Self-pay | Admitting: Oncology

## 2018-05-01 ENCOUNTER — Inpatient Hospital Stay (HOSPITAL_BASED_OUTPATIENT_CLINIC_OR_DEPARTMENT_OTHER): Payer: 59 | Admitting: Adult Health

## 2018-05-01 ENCOUNTER — Other Ambulatory Visit: Payer: Self-pay

## 2018-05-01 ENCOUNTER — Encounter: Payer: Self-pay | Admitting: Adult Health

## 2018-05-01 ENCOUNTER — Inpatient Hospital Stay: Payer: 59

## 2018-05-01 ENCOUNTER — Inpatient Hospital Stay: Payer: 59 | Attending: Oncology

## 2018-05-01 VITALS — BP 117/73 | HR 74 | Temp 98.9°F | Resp 18 | Ht 63.0 in | Wt 125.1 lb

## 2018-05-01 DIAGNOSIS — Z5111 Encounter for antineoplastic chemotherapy: Secondary | ICD-10-CM | POA: Diagnosis not present

## 2018-05-01 DIAGNOSIS — D701 Agranulocytosis secondary to cancer chemotherapy: Secondary | ICD-10-CM

## 2018-05-01 DIAGNOSIS — C801 Malignant (primary) neoplasm, unspecified: Secondary | ICD-10-CM

## 2018-05-01 DIAGNOSIS — C5701 Malignant neoplasm of right fallopian tube: Secondary | ICD-10-CM

## 2018-05-01 DIAGNOSIS — D702 Other drug-induced agranulocytosis: Secondary | ICD-10-CM

## 2018-05-01 DIAGNOSIS — C786 Secondary malignant neoplasm of retroperitoneum and peritoneum: Secondary | ICD-10-CM | POA: Diagnosis not present

## 2018-05-01 DIAGNOSIS — T451X5A Adverse effect of antineoplastic and immunosuppressive drugs, initial encounter: Secondary | ICD-10-CM

## 2018-05-01 DIAGNOSIS — Z5189 Encounter for other specified aftercare: Secondary | ICD-10-CM | POA: Diagnosis not present

## 2018-05-01 DIAGNOSIS — C762 Malignant neoplasm of abdomen: Secondary | ICD-10-CM

## 2018-05-01 DIAGNOSIS — Z95828 Presence of other vascular implants and grafts: Secondary | ICD-10-CM | POA: Insufficient documentation

## 2018-05-01 DIAGNOSIS — G620 Drug-induced polyneuropathy: Secondary | ICD-10-CM

## 2018-05-01 DIAGNOSIS — Z7189 Other specified counseling: Secondary | ICD-10-CM

## 2018-05-01 LAB — CBC WITH DIFFERENTIAL/PLATELET
Abs Immature Granulocytes: 0.02 10*3/uL (ref 0.00–0.07)
Basophils Absolute: 0 10*3/uL (ref 0.0–0.1)
Basophils Relative: 0 %
Eosinophils Absolute: 0.1 10*3/uL (ref 0.0–0.5)
Eosinophils Relative: 2 %
HCT: 30.1 % — ABNORMAL LOW (ref 36.0–46.0)
Hemoglobin: 9.6 g/dL — ABNORMAL LOW (ref 12.0–15.0)
Immature Granulocytes: 0 %
Lymphocytes Relative: 26 %
Lymphs Abs: 1.6 10*3/uL (ref 0.7–4.0)
MCH: 32.7 pg (ref 26.0–34.0)
MCHC: 31.9 g/dL (ref 30.0–36.0)
MCV: 102.4 fL — ABNORMAL HIGH (ref 80.0–100.0)
Monocytes Absolute: 0.7 10*3/uL (ref 0.1–1.0)
Monocytes Relative: 11 %
Neutro Abs: 3.6 10*3/uL (ref 1.7–7.7)
Neutrophils Relative %: 61 %
Platelets: 362 10*3/uL (ref 150–400)
RBC: 2.94 MIL/uL — ABNORMAL LOW (ref 3.87–5.11)
RDW: 22.5 % — ABNORMAL HIGH (ref 11.5–15.5)
WBC: 5.9 10*3/uL (ref 4.0–10.5)
nRBC: 0 % (ref 0.0–0.2)

## 2018-05-01 LAB — CMP (CANCER CENTER ONLY)
ALT: 18 U/L (ref 0–44)
AST: 20 U/L (ref 15–41)
Albumin: 4.3 g/dL (ref 3.5–5.0)
Alkaline Phosphatase: 81 U/L (ref 38–126)
Anion gap: 12 (ref 5–15)
BUN: 14 mg/dL (ref 6–20)
CO2: 20 mmol/L — ABNORMAL LOW (ref 22–32)
Calcium: 9.4 mg/dL (ref 8.9–10.3)
Chloride: 104 mmol/L (ref 98–111)
Creatinine: 0.75 mg/dL (ref 0.44–1.00)
GFR, Est AFR Am: >60 mL/min (ref >60)
GFR, Estimated: >60 mL/min (ref >60)
Glucose, Bld: 89 mg/dL (ref 70–99)
Potassium: 4.3 mmol/L (ref 3.5–5.1)
Sodium: 136 mmol/L (ref 135–145)
Total Bilirubin: 0.6 mg/dL (ref 0.3–1.2)
Total Protein: 7.6 g/dL (ref 6.5–8.1)

## 2018-05-01 MED ORDER — PALONOSETRON HCL INJECTION 0.25 MG/5ML
0.2500 mg | Freq: Once | INTRAVENOUS | Status: AC
  Administered 2018-05-01: 0.25 mg via INTRAVENOUS

## 2018-05-01 MED ORDER — PALONOSETRON HCL INJECTION 0.25 MG/5ML
INTRAVENOUS | Status: AC
  Filled 2018-05-01: qty 5

## 2018-05-01 MED ORDER — POTASSIUM CHLORIDE 2 MEQ/ML IV SOLN
Freq: Once | INTRAVENOUS | Status: AC
  Administered 2018-05-01: 11:00:00 via INTRAVENOUS
  Filled 2018-05-01: qty 10

## 2018-05-01 MED ORDER — SODIUM CHLORIDE 0.9% FLUSH
10.0000 mL | Freq: Once | INTRAVENOUS | Status: AC
  Administered 2018-05-01: 10 mL
  Filled 2018-05-01: qty 10

## 2018-05-01 MED ORDER — HEPARIN SOD (PORK) LOCK FLUSH 100 UNIT/ML IV SOLN
500.0000 [IU] | Freq: Once | INTRAVENOUS | Status: AC
  Administered 2018-05-01: 500 [IU]
  Filled 2018-05-01: qty 5

## 2018-05-01 MED ORDER — SODIUM CHLORIDE 0.9 % IV SOLN
800.0000 mg/m2 | Freq: Once | INTRAVENOUS | Status: AC
  Administered 2018-05-01: 1254 mg via INTRAVENOUS
  Filled 2018-05-01: qty 32.98

## 2018-05-01 MED ORDER — SODIUM CHLORIDE 0.9 % IV SOLN
Freq: Once | INTRAVENOUS | Status: AC
  Administered 2018-05-01: 11:00:00 via INTRAVENOUS
  Filled 2018-05-01: qty 250

## 2018-05-01 MED ORDER — SODIUM CHLORIDE 0.9 % IV SOLN
Freq: Once | INTRAVENOUS | Status: AC
  Administered 2018-05-01: 13:00:00 via INTRAVENOUS
  Filled 2018-05-01: qty 5

## 2018-05-01 MED ORDER — SODIUM CHLORIDE 0.9 % IV SOLN
40.0000 mg/m2 | Freq: Once | INTRAVENOUS | Status: AC
  Administered 2018-05-01: 63 mg via INTRAVENOUS
  Filled 2018-05-01: qty 63

## 2018-05-01 NOTE — Patient Instructions (Signed)
[facility] Discharge Instructions for Patients Receiving Chemotherapy  Removed Onpro after 8:00pm on Tuesday, 04/11/18.  Today you received the following chemotherapy agents:  Gemzar and Cisplatin.  To help prevent nausea and vomiting after your treatment, we encourage you to take your nausea medication as directed.   If you develop nausea and vomiting that is not controlled by your nausea medication, call the clinic.   BELOW ARE SYMPTOMS THAT SHOULD BE REPORTED IMMEDIATELY:  *FEVER GREATER THAN 100.5 F  *CHILLS WITH OR WITHOUT FEVER  NAUSEA AND VOMITING THAT IS NOT CONTROLLED WITH YOUR NAUSEA MEDICATION  *UNUSUAL SHORTNESS OF BREATH  *UNUSUAL BRUISING OR BLEEDING  TENDERNESS IN MOUTH AND THROAT WITH OR WITHOUT PRESENCE OF ULCERS  *URINARY PROBLEMS  *BOWEL PROBLEMS  UNUSUAL RASH Items with * indicate a potential emergency and should be followed up as soon as possible.  Feel free to call the clinic should you have any questions or concerns. The clinic phone number is [phone].  Please show the CHEMO ALERT CARD at check-in to the Emergency Department and triage nurse.   

## 2018-05-01 NOTE — Progress Notes (Addendum)
[facility]  [phone] Fax:(336) [phone]     ID: [name] DOB: November 22, 1965  MR#: [id number]  OEV#:[id number]  Patient Care Team: Harlan Stains, MD as PCP - General (Family Medicine) [name], MD as Consulting Physician (Obstetrics and Gynecology) Juanita Craver, MD as Consulting Physician (Gastroenterology) Servando Salina, MD as Consulting Physician (Obstetrics and Gynecology) Nickie Retort, MD as Consulting Physician (Urology) Gillis Ends, MD as Referring Physician (Obstetrics and Gynecology) OTHER MD: Festus Aloe [phone])   CHIEF COMPLAINT:  ovarian cancer   CURRENT TREATMENT: Cisplatin, gemcitabine   INTERVAL HISTORY: Shirell returns today for follow-up and treatment of her recurrent ovarian cancer. She is accompanied by her husband.  She continues on cisplatin/gemcitabine given on days 1 and 8 of a 21 day cycle with granix given on days 3 and 4, and Onpro given on day 9.  Today is cycle 4 day 1 of treatment.  Itxel traveled to [place], then [place], [place] last week.  She flew through [facility] to the [place] airport.    She stayed at a condo in [place] that was [place], [place].  She notes that she and her kids had itchy eyes and started feeling some sneezing and nasal drainage.  She took some claritin and feels better since taking it.   No known high risk contacts.  No fevers, chills, cough, shortness of breath, or sore throat.    REVIEW OF SYSTEMS: Berenise notes that for 6 days after receiving chemotherapy she has soreness all over her body.  She says this happens with each week, regardless of which injections she receives.  She only received 2 injections in between days 1 and 8 of her most recent cycle and her WBC remained within parameters.  Her nausea has become easier to manage.  She is eating and drinking well.  She says that she is urinating well also.  Hoa has some constipation initially after chemotherapy and  milk of magnesia will help.  She is taking stool softeners daily.  She has had continued stomach bloating and notes that it is slightly improved.  She has a spot in her right upper quadrant that is a pinching and burning area.   Adalei is doing well otherwise.  A detailed ROS was otherwise non contributory.    HISTORY OF PRESENT ILLNESS: From Dr. Serita Grit original intake note 03/17/2015:  "Tacarra Justo is a very pleasant G4P4 who is seen in consultation at the request of Dr Collene Mares and Dr Garwin Brothers for peritoneal carcinomatosis. The patient has a history of a workup by urology for gross hematuria which included a pelvic examine was suggested for extrinsic mass effect on the bladder on cystoscopy. Cystoscopy took place on in December 2016. The patient denies abdominal pain bloating early satiety or abdominal distention.  On 08/03/2015 at [facility] she underwent a CT scan of the abdomen and pelvis as ordered by Dr Baruch Gouty. This revealed a 1.4 cm low attenuation lesion on the posterior right hepatic lobe suggestive of a hemangioma. Status post hysterectomy. No ovarian masses. Moderate ascites. Omental caking seen in the lateral left abdomen and pelvis. Peritoneal nodularity in the left lateral pelvic cul-de-sac. No gross extrinsic compression on the bladder was identified on imaging.  The patient was then seen by her gastroenterologist, Dr. Collene Mares, who performed a colonoscopy which was unremarkable.  Tumor markers were drawn on 08/04/2015 and these included a CA-125 that was elevated to 957, and a CEA that was normal at 1."  On  03/27/2015 the patient underwent diagnostic laparoscopy, exploratory laparotomy with bilateral salpingo-oophorectomy, omentectomy, and radical tumor debulking with optimal cytoreduction (R0) of what proved to be a stage IIIc right fallopian tube cancer. She was treated adjuvantly with carboplatin and paclitaxel, as detailed below. Her CA 125 dropped from 1226 on 03/20/2015 to  26.1 by 06/16/2015.  Her subsequent history is as detailed below.  I PAST MEDICAL HISTORY: Past Medical History:  Diagnosis Date  . Acute sensory neuropathy 06/26/2015  . Allergy   . Anemia   . Asthma    illness induced asthma  . Blood transfusion without reported diagnosis    at age 53 years old D/T surgery to femur being crushed  . Complication of anesthesia    hx. of allergic to ether (had surgery at 53 years and had reaction to the ether  . Heart murmur    pt, states had a "working heart murmur"  . History of kidney stones   . Neutropenia, drug-induced ([facility]) 03/10/2018  . PONV (postoperative nausea and vomiting)   . Skin cancer     PAST SURGICAL HISTORY: Past Surgical History:  Procedure Laterality Date  . 3 laporscopic proceedures    . ABDOMINAL HYSTERECTOMY     2010  . CHOLECYSTECTOMY     2010  . DILATION AND CURETTAGE OF UTERUS     1997  . IR IMAGING GUIDED PORT INSERTION  03/08/2018  . LAPAROSCOPY N/A 03/27/2015   Procedure: DIAGNOSTIC LAPAROSCOPY ;  Surgeon: [name], MD;  Location: [facility];  Service: Gynecology;  Laterality: N/A;  . LAPAROSCOPY N/A 02/24/2016   Procedure: LAPAROSCOPY DIAGNOSTIC WITH PERITONEAL WASHINGS AND PERITONEAL BIOPSY;  Surgeon: [name], MD;  Location: [facility];  Service: Gynecology;  Laterality: N/A;  . LAPAROTOMY N/A 03/27/2015   Procedure: EXPLORATORY LAPAROTOMY, BILATERAL SALPINGO OOPHORECTOMY, OMENTECTOMY, RADICAL TUMOR DEBULKING;  Surgeon: [name], MD;  Location: [facility];  Service: Gynecology;  Laterality: N/A;  . sinus surgery 1994      FAMILY HISTORY Family History  Problem Relation Age of Onset  . Hypertension Father   . Skin cancer Father        nonmelanoma skin cancers in his late 41s  . Non-Hodgkin's lymphoma Maternal Aunt        dx. 58s; smoker  . Stroke Maternal Grandmother   . Other Mother        benign meningioma dx. early-mid-70s; hysterectomy in her late 52s for heavy periods - still has ovaries  . Other Son         one son with pre-cancerous skin findings  . Other Daughter        cysts on ovaries and hx of heavy periods  . Other Sister        hysterectomy for cysts - still has ovaries  . Heart attack Maternal Grandfather   . Heart attack Paternal Grandfather   . Renal cancer Maternal Aunt 60       smoker  . Breast cancer Maternal Aunt 78  . Other Maternal Aunt        dx. benign brain tumor (meningioma) at 24; 2nd benign brain tumor in her late 10s; hx of radical hysterectomy at age 40  . Brain cancer Other        NOS tumor  The patient's parents are both living, in their late 70s as of January 2018. The patient has one brother, 3 sisters. There is no history of ovarian cancer in the family. One maternal aunt had breast and kidney  cancer, another had non-Hodgkin's lymphoma.  GYNECOLOGIC HISTORY:  No LMP recorded. Patient has had a hysterectomy. 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 Her husband [name] is a Engineer, maintenance (IT). Their children are 49, 46, 40 and 90 y/o as of JAN 2018. The oldest works as an Electrical engineer in the [employer] in [place]. The other 3 children live in [place], the youngest currently attending [employer]. The patient has no grandchildren. She attends [employer]     ADVANCED DIRECTIVES:    HEALTH MAINTENANCE: Social History   Tobacco Use  . Smoking status: Never Smoker  . Smokeless tobacco: Never Used  Substance Use Topics  . Alcohol use: Yes    Comment:  3 glasses wine or beer/week  . Drug use: No     Colonoscopy:2016  PAP: s/p hyst  Bone density:   Allergies  Allergen Reactions  . Bee Venom Shortness Of Breath    swelling  . Compazine [Prochlorperazine Edisylate] Anaphylaxis, Anxiety and Palpitations  . Sulfa Antibiotics Shortness Of Breath    Vomit , diarrhea , hives  . Zarxio [Filgrastim]     Current Outpatient Medications  Medication Sig  Dispense Refill  . dexamethasone (DECADRON) 4 MG tablet 2 TABS BY MOUTH ONCE A DAY ON THE DAY AFTER CISPLATIN CHEMO, THEN 2 TABS 2X/DAY FOR 2 DAYS. 30 tablet 0  . fluticasone (FLONASE) 50 MCG/ACT nasal spray Place 1 spray into both nostrils 2 (two) times daily. (Patient taking differently: Place 1 spray into both nostrils as needed. ) 16 g 2  . gabapentin (NEURONTIN) 100 MG capsule Take 1 capsule (100 mg total) by mouth 3 (three) times daily. 270 capsule 3  . lidocaine-prilocaine (EMLA) cream APPLY TOPICALLY AS NEEDED. 30 g 0  . loratadine (CLARITIN) 10 MG tablet Take 10 mg by mouth as needed.     . magnesium oxide (MAG-OX) 400 (241.3 Mg) MG tablet Take 1 tablet (400 mg total) by mouth daily. 60 tablet 3  . omeprazole (PRILOSEC) 40 MG capsule Take 1 capsule (40 mg total) by mouth daily. 30 capsule 5  . ondansetron (ZOFRAN) 8 MG tablet Take 1 tablet (8 mg total) by mouth 2 (two) times daily as needed. Start on the third day after cisplatin chemotherapy. 30 tablet 1  . prochlorperazine (COMPAZINE) 10 MG tablet Take 1 tablet (10 mg total) by mouth every 6 (six) hours as needed (Nausea or vomiting). 30 tablet 1  . sertraline (ZOLOFT) 50 MG tablet TAKE 1 TABLET ONCE DAILY. 30 tablet 2  . traMADol (ULTRAM) 50 MG tablet Take 1 tablet (50 mg total) by mouth every 6 (six) hours as needed. 30 tablet 0  . diphenhydrAMINE (BENADRYL) 50 MG capsule 50 mg Diphenhydramine (Benadryl) PO within 1 hour of the injection 1 capsule 0   No current facility-administered medications for this visit.     OBJECTIVE: Vitals:   05/01/18 0923  BP: 117/73  Pulse: 74  Resp: 18  Temp: 98.9 F (37.2 C)  SpO2: 96%     Body mass index is 22.16 kg/m.   ECOG FS:1 - Symptomatic but completely ambulatory GENERAL: Patient is a well appearing female in no acute distress HEENT:  Sclerae anicteric.  Oropharynx clear and moist. No ulcerations or evidence of oropharyngeal candidiasis. Neck is supple.  NODES:  No cervical,  supraclavicular, or axillary lymphadenopathy palpated.  LUNGS:  Clear to auscultation bilaterally.  No  wheezes or rhonchi. HEART:  Regular rate and rhythm. No murmur appreciated. ABDOMEN:  Soft, mildly tender, very mildly distended.  Positive, normoactive bowel sounds. No organomegaly palpated. MSK:  No focal spinal tenderness to palpation. Full range of motion bilaterally in the upper extremities. EXTREMITIES:  No peripheral edema.   SKIN:  Clear with no obvious rashes or skin changes. No nail dyscrasia. NEURO:  Nonfocal. Well oriented.  Appropriate affect.      LAB RESULTS:  CMP     Component Value Date/Time   NA 135 04/10/2018 0814   NA 140 02/11/2017 0755   K 4.3 04/10/2018 0814   K 4.4 02/11/2017 0755   CL 101 04/10/2018 0814   CO2 24 04/10/2018 0814   CO2 26 02/11/2017 0755   GLUCOSE 87 04/10/2018 0814   GLUCOSE 93 02/11/2017 0755   BUN 11 04/10/2018 0814   BUN 7.9 02/11/2017 0755   CREATININE 0.69 04/10/2018 0814   CREATININE 0.79 01/16/2018 1240   CREATININE 0.7 02/11/2017 0755   CALCIUM 9.3 04/10/2018 0814   CALCIUM 9.6 02/11/2017 0755   PROT 7.4 04/10/2018 0814   PROT 7.5 02/11/2017 0755   ALBUMIN 4.3 04/10/2018 0814   ALBUMIN 4.4 02/11/2017 0755   AST 20 04/10/2018 0814   AST 25 01/16/2018 1240   AST 27 02/11/2017 0755   ALT 27 04/10/2018 0814   ALT 19 01/16/2018 1240   ALT 24 02/11/2017 0755   ALKPHOS 96 04/10/2018 0814   ALKPHOS 58 02/11/2017 0755   BILITOT 0.4 04/10/2018 0814   BILITOT 0.5 01/16/2018 1240   BILITOT 0.43 02/11/2017 0755   GFRNONAA >60 04/10/2018 0814   GFRNONAA >60 01/16/2018 1240   GFRAA >60 04/10/2018 0814   GFRAA >60 01/16/2018 1240    INo results found for: SPEP, UPEP  Lab Results  Component Value Date   WBC 5.9 05/01/2018   NEUTROABS 3.6 05/01/2018   HGB 9.6 (L) 05/01/2018   HCT 30.1 (L) 05/01/2018   MCV 102.4 (H) 05/01/2018   PLT 362 05/01/2018      Chemistry      Component Value Date/Time   NA 135 04/10/2018  0814   NA 140 02/11/2017 0755   K 4.3 04/10/2018 0814   K 4.4 02/11/2017 0755   CL 101 04/10/2018 0814   CO2 24 04/10/2018 0814   CO2 26 02/11/2017 0755   BUN 11 04/10/2018 0814   BUN 7.9 02/11/2017 0755   CREATININE 0.69 04/10/2018 0814   CREATININE 0.79 01/16/2018 1240   CREATININE 0.7 02/11/2017 0755      Component Value Date/Time   CALCIUM 9.3 04/10/2018 0814   CALCIUM 9.6 02/11/2017 0755   ALKPHOS 96 04/10/2018 0814   ALKPHOS 58 02/11/2017 0755   AST 20 04/10/2018 0814   AST 25 01/16/2018 1240   AST 27 02/11/2017 0755   ALT 27 04/10/2018 0814   ALT 19 01/16/2018 1240   ALT 24 02/11/2017 0755   BILITOT 0.4 04/10/2018 0814   BILITOT 0.5 01/16/2018 1240   BILITOT 0.43 02/11/2017 0755       No results found for: LABCA2  No components found for: QIONG295  No results for input(s): INR in the last 168 hours.  Urinalysis    Component Value Date/Time   COLORURINE STRAW (A) 11/22/2017 0940   APPEARANCEUR CLEAR 11/22/2017 0940   LABSPEC 1.002 (L) 11/22/2017 0940   LABSPEC 1.005 10/09/2015 1239   PHURINE 7.0 11/22/2017 0940   GLUCOSEU NEGATIVE 11/22/2017 0940   GLUCOSEU Negative 10/09/2015  [place] 11/22/2017 0940   BILIRUBINUR NEGATIVE 11/22/2017 0940   BILIRUBINUR Negative 10/09/2015 1239   KETONESUR NEGATIVE 11/22/2017 0940   PROTEINUR NEGATIVE 11/22/2017 0940   UROBILINOGEN 0.2 10/09/2015 1239   NITRITE NEGATIVE 11/22/2017 0940   LEUKOCYTESUR NEGATIVE 11/22/2017 0940   LEUKOCYTESUR Negative 10/09/2015 1239    STUDIES: No results found.   ELIGIBLE FOR AVAILABLE RESEARCH PROTOCOL:  discussing protocols at [facility]: 28 y.o. BRCA negative [place] woman status post radical tumor debulking with optimal cytoreduction (R0) 03/27/2015 for a stage IIIC, high-grade right fallopian tube carcinoma  (a) baseline CA-125 was 1226.  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  (c.1347A>G (p.Lys449Lys)  (c) tumor is PD-L1 negative (02/24/2016)  (d) tumor is strongly estrogen receptor positive, progesterone receptor negative (02/24/2016)  (1) adjuvant chemotherapy consisted of carboplatin and paclitaxel for 6 doses, begun 04/14/2015, completed 08/11/2015  (a) paclitaxel was omitted from cycle 5 and dose reduced on cycle 6 because of neuropathy   (b) a "make up" dose of paclitaxel was given 08/25/2015  (c) last carboplatin dose was 08/11/2015  (d) CA-125 normalized by 06/16/2015   (2) FIRST RECURRENCE: January 2018  (a) CA-125 rise beginning November 2017 led to CT scans and PET scans December 2017, all negative  (b) exploratory laparotomy 02/24/2016 showed pathologically confirmed recurrence, with miliary disease involving all examined surfaces  (c) port-site metastases noted 03/08/2016  (d) the January 2018 tumor sample was tested for the estrogen receptor and he was 80 percent positive with strong staining, progesterone receptor negative  (3) carboplatin/liposomal doxorubicin started 03/05/2016, repeated every three weeks x4, completed 06/04/2016.  (a) cycle 3 delayed one week because of neutropenia; OnPro added  (b) CA 125 normalized after cycle 3  (4) RUCAPARIB maintenance started 07/02/2016  (a) restaging 10/01/2016: Normal CA 125, negative CT of the abdomen and pelvis  (b) labs 11/24/2016 shows a rise in her CA 125-43.4, with continuing rise thereafter  (c) rucaparib discontinued October 2018 with rising CA 125  (5) anastrozole started December 23, 2016-stopped after a couple of weeks with poor tolerance  SECOND RECURRENCE: (6) Gemcitabine/Bevacizumab started on 02/04/2017  (a) gemcitabine omitted cycle 4 because of intercurrent infection  (b) gemcitabine resumed with cycle 5, with intervening rise in  Ca 125  (c) repeat CT scan of the abdomen and pelvis on  05/26/2017 does not confirm obvious disease progression  (d) cycle 5 of gemcitabine/bevacizumab delayed 1 week   (e) gemcitabine/ bevacizumab discontinued after 6 cycles, with rising CA 125 (last dose 06/24/2017)  (7) foundation one testing did not show a high mutation burden and the microsatellite status was stable.  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  These were not immediately actionable  (8) Abraxane started 07/12/2017, given weekly or weeks 1 and 2 of each 3 weeks cycle, last dose 11/15/2017 (5 cycles)  (a) cycle 2 started 08/09/2017, will be day 1 day 8 only  (b) cycle 3 scheduled to start 09/13/2017, day 1 day 8 at patient request  (c) CT of the abdomen and pelvis 11/28/2017 shows no measurable disease  (d) PET scan 01/25/2018 and MRI of the pelvis 02/03/2018 show no measurable disease  (e) patient has symptomatic ascites and a rapidly rising Ca1 25  (9) started cisplatin/gemcitabine days 1 and 8 of each 21-day cycle on 02/18/2018   PLAN:  Olayinka's labs are stable, and we have seen a good response in her tumor marker, from over 3000 to 608.  This is good news.  She will proceed with her fourth cycle of Cisplatin and Gemcitabine today.  We reviewed her injections.  She did well with granix for 2 days instead of 3 and she will do this between days 1 and 8 of her treatment for this cycle.  She met with Dr. Jana Hakim today as well, who overall is very pleased with how she is tolerating along with responding to treatment.  He discussed her travel to [place], and confirmed that she is indeed asymptomatic for Coronavirus.  He did review parameters to call us, such as low grade fevers, cough, and we will order screening if those do develop.    I recommended Malkia continue on Claritin for her sneezing and eye itching since it seems to be helping those symptoms.    She will continue her current bowel regimen.    She will return Wednesday and Thursday for injection, in 1 week for  labs and day 8 of Cisplatin/Gemcitabine, and in 3 weeks she will see Dr. Jana Hakim with her fifth cycle of treatment.    Wilber Bihari, NP  05/01/18 9:48 AM Medical Oncology and Hematology [facility] [address] [place], [place] [address] Tel. [phone]    Fax. [phone]   ADDENDUM: Aolani is tolerating her chemotherapy well, with no unexpected side effects.  The fact that she was able to go skiing recently is remarkable.  While she still has some mild and intermittent abdominal symptoms, these appear improved and the drop in her Ca1 25 has been very encouraging  She will receive the first day of her fourth cycle today, and day 8 next week.  I will add OnPro to her day 8 treatments  The question remains what we will do once we complete 6 cycles assuming we get to normalize her markers.  It is not clear that another round of PARP inhibitors would be helpful  I personally saw this patient and performed a substantive portion of this encounter with the listed APP documented above.   Chauncey Cruel, MD Medical Oncology and Hematology [facility] [address] [place], [place] [address] Tel. [phone]    Fax. [phone]

## 2018-05-02 ENCOUNTER — Other Ambulatory Visit: Payer: Self-pay | Admitting: Oncology

## 2018-05-02 DIAGNOSIS — C5701 Malignant neoplasm of right fallopian tube: Secondary | ICD-10-CM

## 2018-05-02 DIAGNOSIS — C786 Secondary malignant neoplasm of retroperitoneum and peritoneum: Secondary | ICD-10-CM

## 2018-05-02 DIAGNOSIS — Z7189 Other specified counseling: Secondary | ICD-10-CM

## 2018-05-02 DIAGNOSIS — C801 Malignant (primary) neoplasm, unspecified: Principal | ICD-10-CM

## 2018-05-02 DIAGNOSIS — C762 Malignant neoplasm of abdomen: Secondary | ICD-10-CM

## 2018-05-02 LAB — CA 125: Cancer Antigen (CA) 125: 140 U/mL — ABNORMAL HIGH (ref 0.0–38.1)

## 2018-05-03 ENCOUNTER — Inpatient Hospital Stay: Payer: 59

## 2018-05-03 ENCOUNTER — Other Ambulatory Visit: Payer: Self-pay

## 2018-05-03 DIAGNOSIS — C762 Malignant neoplasm of abdomen: Secondary | ICD-10-CM

## 2018-05-03 DIAGNOSIS — Z95828 Presence of other vascular implants and grafts: Secondary | ICD-10-CM

## 2018-05-03 DIAGNOSIS — D701 Agranulocytosis secondary to cancer chemotherapy: Secondary | ICD-10-CM

## 2018-05-03 DIAGNOSIS — T451X5A Adverse effect of antineoplastic and immunosuppressive drugs, initial encounter: Secondary | ICD-10-CM

## 2018-05-03 DIAGNOSIS — Z5111 Encounter for antineoplastic chemotherapy: Secondary | ICD-10-CM | POA: Diagnosis not present

## 2018-05-03 DIAGNOSIS — D702 Other drug-induced agranulocytosis: Secondary | ICD-10-CM

## 2018-05-03 MED ORDER — TBO-FILGRASTIM 480 MCG/0.8ML ~~LOC~~ SOSY
480.0000 ug | PREFILLED_SYRINGE | Freq: Once | SUBCUTANEOUS | Status: AC
  Administered 2018-05-03: 480 ug via SUBCUTANEOUS

## 2018-05-03 MED ORDER — TBO-FILGRASTIM 480 MCG/0.8ML ~~LOC~~ SOSY
PREFILLED_SYRINGE | SUBCUTANEOUS | Status: AC
  Filled 2018-05-03: qty 0.8

## 2018-05-03 NOTE — Patient Instructions (Signed)
Tbo-Filgrastim injection What is this medicine? TBO-FILGRASTIM (T B O fil GRA stim) is a granulocyte colony-stimulating factor that stimulates the growth of neutrophils, a type of white blood cell important in the body's fight against infection. It is used to reduce the incidence of fever and infection in patients with certain types of cancer who are receiving chemotherapy that affects the bone marrow. This medicine may be used for other purposes; ask your health care provider or pharmacist if you have questions. COMMON BRAND NAME(S): Granix What should I tell my health care provider before I take this medicine? They need to know if you have any of these conditions: -bone scan or tests planned -kidney disease -sickle cell anemia -an unusual or allergic reaction to tbo-filgrastim, filgrastim, pegfilgrastim, other medicines, foods, dyes, or preservatives -pregnant or trying to get pregnant -breast-feeding How should I use this medicine? This medicine is for injection under the skin. If you get this medicine at home, you will be taught how to prepare and give this medicine. Refer to the Instructions for Use that come with your medication packaging. Use exactly as directed. Take your medicine at regular intervals. Do not take your medicine more often than directed. It is important that you put your used needles and syringes in a special sharps container. Do not put them in a trash can. If you do not have a sharps container, call your pharmacist or healthcare provider to get one. Talk to your pediatrician regarding the use of this medicine in children. While this drug may be prescribed for children as young as 1 month of age for selected conditions, precautions do apply. Overdosage: If you think you have taken too much of this medicine contact a poison control center or emergency room at once. NOTE: This medicine is only for you. Do not share this medicine with others. What if I miss a dose? It is  important not to miss your dose. Call your doctor or health care professional if you miss a dose. What may interact with this medicine? This medicine may interact with the following medications: -medicines that may cause a release of neutrophils, such as lithium This list may not describe all possible interactions. Give your health care provider a list of all the medicines, herbs, non-prescription drugs, or dietary supplements you use. Also tell them if you smoke, drink alcohol, or use illegal drugs. Some items may interact with your medicine. What should I watch for while using this medicine? You may need blood work done while you are taking this medicine. What side effects may I notice from receiving this medicine? Side effects that you should report to your doctor or health care professional as soon as possible: -allergic reactions like skin rash, itching or hives, swelling of the face, lips, or tongue -back pain -blood in the urine -dark urine -dizziness -fast heartbeat -feeling faint -shortness of breath or breathing problems -signs and symptoms of infection like fever or chills; cough; or sore throat -signs and symptoms of kidney injury like trouble passing urine or change in the amount of urine -stomach or side pain, or pain at the shoulder -sweating -swelling of the legs, ankles, or abdomen -tiredness Side effects that usually do not require medical attention (report to your doctor or health care professional if they continue or are bothersome): -bone pain -diarrhea -headache -muscle pain -vomiting This list may not describe all possible side effects. Call your doctor for medical advice about side effects. You may report side effects to FDA at   1-800-FDA-1088. Where should I keep my medicine? Keep out of the reach of children. Store in a refrigerator between 2 and 8 degrees C (36 and 46 degrees F). Keep in carton to protect from light. Throw away this medicine if it is left out  of the refrigerator for more than 5 consecutive days. Throw away any unused medicine after the expiration date. NOTE: This sheet is a summary. It may not cover all possible information. If you have questions about this medicine, talk to your doctor, pharmacist, or health care provider.  2019 [employer] (2016-09-21 16:56:18)  

## 2018-05-04 ENCOUNTER — Other Ambulatory Visit: Payer: Self-pay

## 2018-05-04 ENCOUNTER — Inpatient Hospital Stay: Payer: 59

## 2018-05-04 DIAGNOSIS — Z95828 Presence of other vascular implants and grafts: Secondary | ICD-10-CM

## 2018-05-04 DIAGNOSIS — C762 Malignant neoplasm of abdomen: Secondary | ICD-10-CM

## 2018-05-04 DIAGNOSIS — D701 Agranulocytosis secondary to cancer chemotherapy: Secondary | ICD-10-CM

## 2018-05-04 DIAGNOSIS — T451X5A Adverse effect of antineoplastic and immunosuppressive drugs, initial encounter: Secondary | ICD-10-CM

## 2018-05-04 DIAGNOSIS — Z5111 Encounter for antineoplastic chemotherapy: Secondary | ICD-10-CM | POA: Diagnosis not present

## 2018-05-04 DIAGNOSIS — D702 Other drug-induced agranulocytosis: Secondary | ICD-10-CM

## 2018-05-04 MED ORDER — TBO-FILGRASTIM 480 MCG/0.8ML ~~LOC~~ SOSY
PREFILLED_SYRINGE | SUBCUTANEOUS | Status: AC
  Filled 2018-05-04: qty 0.8

## 2018-05-04 MED ORDER — TBO-FILGRASTIM 480 MCG/0.8ML ~~LOC~~ SOSY
480.0000 ug | PREFILLED_SYRINGE | Freq: Once | SUBCUTANEOUS | Status: AC
  Administered 2018-05-04: 480 ug via SUBCUTANEOUS

## 2018-05-04 NOTE — Patient Instructions (Signed)
Tbo-Filgrastim injection What is this medicine? TBO-FILGRASTIM (T B O fil GRA stim) is a granulocyte colony-stimulating factor that stimulates the growth of neutrophils, a type of white blood cell important in the body's fight against infection. It is used to reduce the incidence of fever and infection in patients with certain types of cancer who are receiving chemotherapy that affects the bone marrow. This medicine may be used for other purposes; ask your health care provider or pharmacist if you have questions. COMMON BRAND NAME(S): Granix What should I tell my health care provider before I take this medicine? They need to know if you have any of these conditions: -bone scan or tests planned -kidney disease -sickle cell anemia -an unusual or allergic reaction to tbo-filgrastim, filgrastim, pegfilgrastim, other medicines, foods, dyes, or preservatives -pregnant or trying to get pregnant -breast-feeding How should I use this medicine? This medicine is for injection under the skin. If you get this medicine at home, you will be taught how to prepare and give this medicine. Refer to the Instructions for Use that come with your medication packaging. Use exactly as directed. Take your medicine at regular intervals. Do not take your medicine more often than directed. It is important that you put your used needles and syringes in a special sharps container. Do not put them in a trash can. If you do not have a sharps container, call your pharmacist or healthcare provider to get one. Talk to your pediatrician regarding the use of this medicine in children. While this drug may be prescribed for children as young as 1 month of age for selected conditions, precautions do apply. Overdosage: If you think you have taken too much of this medicine contact a poison control center or emergency room at once. NOTE: This medicine is only for you. Do not share this medicine with others. What if I miss a dose? It is  important not to miss your dose. Call your doctor or health care professional if you miss a dose. What may interact with this medicine? This medicine may interact with the following medications: -medicines that may cause a release of neutrophils, such as lithium This list may not describe all possible interactions. Give your health care provider a list of all the medicines, herbs, non-prescription drugs, or dietary supplements you use. Also tell them if you smoke, drink alcohol, or use illegal drugs. Some items may interact with your medicine. What should I watch for while using this medicine? You may need blood work done while you are taking this medicine. What side effects may I notice from receiving this medicine? Side effects that you should report to your doctor or health care professional as soon as possible: -allergic reactions like skin rash, itching or hives, swelling of the face, lips, or tongue -back pain -blood in the urine -dark urine -dizziness -fast heartbeat -feeling faint -shortness of breath or breathing problems -signs and symptoms of infection like fever or chills; cough; or sore throat -signs and symptoms of kidney injury like trouble passing urine or change in the amount of urine -stomach or side pain, or pain at the shoulder -sweating -swelling of the legs, ankles, or abdomen -tiredness Side effects that usually do not require medical attention (report to your doctor or health care professional if they continue or are bothersome): -bone pain -diarrhea -headache -muscle pain -vomiting This list may not describe all possible side effects. Call your doctor for medical advice about side effects. You may report side effects to FDA at   1-800-FDA-1088. Where should I keep my medicine? Keep out of the reach of children. Store in a refrigerator between 2 and 8 degrees C (36 and 46 degrees F). Keep in carton to protect from light. Throw away this medicine if it is left out  of the refrigerator for more than 5 consecutive days. Throw away any unused medicine after the expiration date. NOTE: This sheet is a summary. It may not cover all possible information. If you have questions about this medicine, talk to your doctor, pharmacist, or health care provider.  2019 [employer] (2016-09-21 16:56:18)  

## 2018-05-08 ENCOUNTER — Inpatient Hospital Stay: Payer: 59

## 2018-05-08 ENCOUNTER — Other Ambulatory Visit: Payer: Self-pay

## 2018-05-08 ENCOUNTER — Ambulatory Visit: Payer: 59 | Admitting: Adult Health

## 2018-05-08 VITALS — BP 118/67 | HR 61 | Temp 98.7°F | Resp 18 | Wt 125.2 lb

## 2018-05-08 DIAGNOSIS — Z95828 Presence of other vascular implants and grafts: Secondary | ICD-10-CM

## 2018-05-08 DIAGNOSIS — C762 Malignant neoplasm of abdomen: Secondary | ICD-10-CM

## 2018-05-08 DIAGNOSIS — C786 Secondary malignant neoplasm of retroperitoneum and peritoneum: Secondary | ICD-10-CM

## 2018-05-08 DIAGNOSIS — D702 Other drug-induced agranulocytosis: Secondary | ICD-10-CM

## 2018-05-08 DIAGNOSIS — Z5111 Encounter for antineoplastic chemotherapy: Secondary | ICD-10-CM | POA: Diagnosis not present

## 2018-05-08 DIAGNOSIS — D701 Agranulocytosis secondary to cancer chemotherapy: Secondary | ICD-10-CM

## 2018-05-08 DIAGNOSIS — C801 Malignant (primary) neoplasm, unspecified: Principal | ICD-10-CM

## 2018-05-08 DIAGNOSIS — C5701 Malignant neoplasm of right fallopian tube: Secondary | ICD-10-CM

## 2018-05-08 DIAGNOSIS — Z7189 Other specified counseling: Secondary | ICD-10-CM

## 2018-05-08 DIAGNOSIS — T451X5A Adverse effect of antineoplastic and immunosuppressive drugs, initial encounter: Secondary | ICD-10-CM

## 2018-05-08 LAB — CMP (CANCER CENTER ONLY)
ALT: 25 U/L (ref 0–44)
AST: 22 U/L (ref 15–41)
Albumin: 3.9 g/dL (ref 3.5–5.0)
Alkaline Phosphatase: 67 U/L (ref 38–126)
Anion gap: 10 (ref 5–15)
BUN: 13 mg/dL (ref 6–20)
CO2: 22 mmol/L (ref 22–32)
Calcium: 8.6 mg/dL — ABNORMAL LOW (ref 8.9–10.3)
Chloride: 106 mmol/L (ref 98–111)
Creatinine: 0.74 mg/dL (ref 0.44–1.00)
GFR, Est AFR Am: >60 mL/min (ref >60)
GFR, Estimated: >60 mL/min (ref >60)
Glucose, Bld: 105 mg/dL — ABNORMAL HIGH (ref 70–99)
Potassium: 4.4 mmol/L (ref 3.5–5.1)
Sodium: 138 mmol/L (ref 135–145)
Total Bilirubin: 0.3 mg/dL (ref 0.3–1.2)
Total Protein: 6.9 g/dL (ref 6.5–8.1)

## 2018-05-08 LAB — CBC WITH DIFFERENTIAL/PLATELET
Abs Immature Granulocytes: 0.03 10*3/uL (ref 0.00–0.07)
Basophils Absolute: 0 10*3/uL (ref 0.0–0.1)
Basophils Relative: 0 %
Eosinophils Absolute: 0 10*3/uL (ref 0.0–0.5)
Eosinophils Relative: 0 %
HCT: 28.3 % — ABNORMAL LOW (ref 36.0–46.0)
Hemoglobin: 9.1 g/dL — ABNORMAL LOW (ref 12.0–15.0)
Immature Granulocytes: 1 %
Lymphocytes Relative: 50 %
Lymphs Abs: 1.4 10*3/uL (ref 0.7–4.0)
MCH: 33.2 pg (ref 26.0–34.0)
MCHC: 32.2 g/dL (ref 30.0–36.0)
MCV: 103.3 fL — ABNORMAL HIGH (ref 80.0–100.0)
Monocytes Absolute: 0.3 10*3/uL (ref 0.1–1.0)
Monocytes Relative: 12 %
Neutro Abs: 1 10*3/uL — ABNORMAL LOW (ref 1.7–7.7)
Neutrophils Relative %: 37 %
Platelets: 214 10*3/uL (ref 150–400)
RBC: 2.74 MIL/uL — ABNORMAL LOW (ref 3.87–5.11)
RDW: 20.2 % — ABNORMAL HIGH (ref 11.5–15.5)
WBC: 2.8 10*3/uL — ABNORMAL LOW (ref 4.0–10.5)
nRBC: 0 % (ref 0.0–0.2)

## 2018-05-08 LAB — MAGNESIUM: Magnesium: 1.5 mg/dL — ABNORMAL LOW (ref 1.7–2.4)

## 2018-05-08 MED ORDER — SODIUM CHLORIDE 0.9 % IV SOLN
Freq: Once | INTRAVENOUS | Status: AC
  Administered 2018-05-08: 13:00:00 via INTRAVENOUS
  Filled 2018-05-08: qty 5

## 2018-05-08 MED ORDER — PALONOSETRON HCL INJECTION 0.25 MG/5ML
0.2500 mg | Freq: Once | INTRAVENOUS | Status: AC
  Administered 2018-05-08: 0.25 mg via INTRAVENOUS

## 2018-05-08 MED ORDER — SODIUM CHLORIDE 0.9 % IV SOLN
800.0000 mg/m2 | Freq: Once | INTRAVENOUS | Status: AC
  Administered 2018-05-08: 1254 mg via INTRAVENOUS
  Filled 2018-05-08: qty 32.98

## 2018-05-08 MED ORDER — SODIUM CHLORIDE 0.9% FLUSH
10.0000 mL | INTRAVENOUS | Status: DC | PRN
  Administered 2018-05-08: 10 mL
  Filled 2018-05-08: qty 10

## 2018-05-08 MED ORDER — SODIUM CHLORIDE 0.9 % IV SOLN
Freq: Once | INTRAVENOUS | Status: AC
  Administered 2018-05-08: 10:00:00 via INTRAVENOUS
  Filled 2018-05-08: qty 250

## 2018-05-08 MED ORDER — PEGFILGRASTIM 6 MG/0.6ML ~~LOC~~ PSKT
6.0000 mg | PREFILLED_SYRINGE | Freq: Once | SUBCUTANEOUS | Status: AC
  Administered 2018-05-08: 6 mg via SUBCUTANEOUS

## 2018-05-08 MED ORDER — PALONOSETRON HCL INJECTION 0.25 MG/5ML
INTRAVENOUS | Status: AC
  Filled 2018-05-08: qty 5

## 2018-05-08 MED ORDER — SODIUM CHLORIDE 0.9% FLUSH
10.0000 mL | Freq: Once | INTRAVENOUS | Status: AC
  Administered 2018-05-08: 10 mL
  Filled 2018-05-08: qty 10

## 2018-05-08 MED ORDER — PEGFILGRASTIM 6 MG/0.6ML ~~LOC~~ PSKT
PREFILLED_SYRINGE | SUBCUTANEOUS | Status: AC
  Filled 2018-05-08: qty 0.6

## 2018-05-08 MED ORDER — POTASSIUM CHLORIDE 2 MEQ/ML IV SOLN
Freq: Once | INTRAVENOUS | Status: AC
  Administered 2018-05-08: 11:00:00 via INTRAVENOUS
  Filled 2018-05-08: qty 10

## 2018-05-08 MED ORDER — SODIUM CHLORIDE 0.9 % IV SOLN
40.0000 mg/m2 | Freq: Once | INTRAVENOUS | Status: AC
  Administered 2018-05-08: 63 mg via INTRAVENOUS
  Filled 2018-05-08: qty 63

## 2018-05-08 MED ORDER — HEPARIN SOD (PORK) LOCK FLUSH 100 UNIT/ML IV SOLN
500.0000 [IU] | Freq: Once | INTRAVENOUS | Status: AC | PRN
  Administered 2018-05-08: 500 [IU]
  Filled 2018-05-08: qty 5

## 2018-05-08 NOTE — Progress Notes (Signed)
Per Dr. Jana Hakim, ok to treat with ANC of 1.0. Pt will receive Neulasta onpro at completion of treatment today.

## 2018-05-08 NOTE — Patient Instructions (Signed)
Coronavirus (COVID-19) Are you at risk?  Are you at risk for the Coronavirus (COVID-19)?  To be considered HIGH RISK for Coronavirus (COVID-19), you have to meet the following criteria:  . Traveled to [place], [place], [name], [place] or [place]; or in the [place] to [place], [place], [place], or [place]; and have fever, cough, and shortness of breath within the last 2 weeks of travel OR . Been in close contact with a person diagnosed with COVID-19 within the last 2 weeks and have fever, cough, and shortness of breath . IF YOU DO NOT MEET THESE CRITERIA, YOU ARE CONSIDERED LOW RISK FOR COVID-19.  What to do if you are HIGH RISK for COVID-19?  . If you are having a medical emergency, call 911. . Seek medical care right away. Before you go to a doctor's office, urgent care or emergency department, call ahead and tell them about your recent travel, contact with someone diagnosed with COVID-19, and your symptoms. You should receive instructions from your physician's office regarding next steps of care.  . When you arrive at healthcare provider, tell the healthcare staff immediately you have returned from visiting [place], [place], [place], [place] or [name]; or traveled in the [place] to [place], [place], [place], or [place]; in the last two weeks or you have been in close contact with a person diagnosed with COVID-19 in the last 2 weeks.   . Tell the health care staff about your symptoms: fever, cough and shortness of breath. . After you have been seen by a medical provider, you will be either: o Tested for (COVID-19) and discharged home on quarantine except to seek medical care if symptoms worsen, and asked to  - Stay home and avoid contact with others until you get your results (4-5 days)  - Avoid travel on public transportation if possible (such as bus, train, or airplane) or o Sent to the Emergency Department by EMS for evaluation, COVID-19 testing, and possible  admission depending on your condition and test results.  What to do if you are LOW RISK for COVID-19?  Reduce your risk of any infection by using the same precautions used for avoiding the common cold or flu:  . Wash your hands often with soap and warm water for at least 20 seconds.  If soap and water are not readily available, use an alcohol-based hand sanitizer with at least 60% alcohol.  . If coughing or sneezing, cover your mouth and nose by coughing or sneezing into the elbow areas of your shirt or coat, into a tissue or into your sleeve (not your hands). . Avoid shaking hands with others and consider head nods or verbal greetings only. . Avoid touching your eyes, nose, or mouth with unwashed hands.  . Avoid close contact with people who are sick. . Avoid places or events with large numbers of people in one location, like concerts or sporting events. . Carefully consider travel plans you have or are making. . If you are planning any travel outside or inside the [place], visit the CDC's Travelers' Health webpage for the latest health notices. . If you have some symptoms but not all symptoms, continue to monitor at home and seek medical attention if your symptoms worsen. . If you are having a medical emergency, call 911.   ADDITIONAL HEALTHCARE OPTIONS FOR PATIENTS  [facility] / e-Visit: [email or web address]         [facility]: [phone]  [facility]: [phone]                   [facility]: [phone]   [facility] Discharge Instructions for Patients Receiving Chemotherapy  Today you received the following chemotherapy agents Gemzar and Cisplatin   To help prevent nausea and vomiting after your treatment, we encourage you to take your nausea medication as directed.    If you develop nausea and vomiting that is not controlled by your nausea medication, call the clinic.    BELOW ARE SYMPTOMS THAT SHOULD BE REPORTED IMMEDIATELY:  *FEVER GREATER THAN 100.5 F  *CHILLS WITH OR WITHOUT FEVER  NAUSEA AND VOMITING THAT IS NOT CONTROLLED WITH YOUR NAUSEA MEDICATION  *UNUSUAL SHORTNESS OF BREATH  *UNUSUAL BRUISING OR BLEEDING  TENDERNESS IN MOUTH AND THROAT WITH OR WITHOUT PRESENCE OF ULCERS  *URINARY PROBLEMS  *BOWEL PROBLEMS  UNUSUAL RASH Items with * indicate a potential emergency and should be followed up as soon as possible.  Feel free to call the clinic should you have any questions or concerns. The clinic phone number is [phone].  Please show the CHEMO ALERT CARD at check-in to the Emergency Department and triage nurse.   

## 2018-05-09 NOTE — Progress Notes (Signed)
Mag level = 1.5. ok to treat. Pt has not been taking her Magnesium tablets recently.   Hardie Pulley, PharmD, BCPS, BCOP

## 2018-05-11 ENCOUNTER — Other Ambulatory Visit: Payer: Self-pay | Admitting: Oncology

## 2018-05-11 ENCOUNTER — Telehealth: Payer: Self-pay

## 2018-05-11 NOTE — Telephone Encounter (Signed)
Per Dr. Jana Hakim, inform patient he will be in office to speak with her on 4/6.    Pt will confidential voicemail - Nurse left voicemail informing patient provider will be in office 4/6.  Contact information left for additional questions if needed.

## 2018-05-12 ENCOUNTER — Ambulatory Visit: Payer: 59

## 2018-05-19 NOTE — Progress Notes (Addendum)
[place]  [phone] [phone]     ID: Kathryn Ramsey DOB: Jul 28, 1965  MR#: [id number]  HGD#:[id number]  Patient Care Team: Harlan Stains, MD as PCP - General (Family Medicine) [name], MD as Consulting Physician (Obstetrics and Gynecology) Juanita Craver, MD as Consulting Physician (Gastroenterology) Servando Salina, MD as Consulting Physician (Obstetrics and Gynecology) Nickie Retort, MD as Consulting Physician (Urology) Gillis Ends, MD as Referring Physician (Obstetrics and Gynecology) OTHER MD: Festus Aloe [phone])   CHIEF COMPLAINT:  ovarian cancer   CURRENT TREATMENT: Cisplatin, gemcitabine; Aranesp   INTERVAL HISTORY: Kathryn Ramsey returns today for follow-up and treatment of her recurrent ovarian cancer.   She continues on cisplatin/gemcitabine given on days 1 and 8 of a 21 day cycle with granix given on days 3 and 4, and Onpro given on day 9.   She reports good days and bad days, with the best day being the day following chemotherapy, likely secondary to the steroids. She states fatigue is the worst problem she is having, which is at least partly going to be due to her significant anemia.  She also has significant gastro intestinal issues (constipation lasting about a week, reflux problems and bloating), and tenderness to her back and upper arms.   She is scheduled for screening mammogram on 06/07/2018.   REVIEW OF SYSTEMS: Kathryn Ramsey reports she tries to walk at least two miles once or twice a week. She states she works to keep her weight stable. She reports an ingrown toenail, a bump on her tongue, headaches, and a pinch in her right upper quadrant (especially when she stretches). The patient denies visual changes, nausea, vomiting, stiff neck, dizziness, or gait imbalance. There has been no cough, phlegm production, or pleurisy, no chest pain or pressure, and no change in bladder habits. The patient denies  fever, rash, bleeding, unexplained fatigue or unexplained weight loss. A detailed review of systems was otherwise entirely negative.   HISTORY OF PRESENT ILLNESS: From Dr. Serita Grit original intake note 03/17/2015:  "Kathryn Ramsey is a very pleasant G4P4 who is seen in consultation at the request of Dr Collene Mares and Dr Garwin Brothers for peritoneal carcinomatosis. The patient has a history of a workup by urology for gross hematuria which included a pelvic examine was suggested for extrinsic mass effect on the bladder on cystoscopy. Cystoscopy took place on in December 2016. The patient denies abdominal pain bloating early satiety or abdominal distention.  On 08/03/2015 at [facility] she underwent a CT scan of the abdomen and pelvis as ordered by Dr Baruch Gouty. This revealed a 1.4 cm low attenuation lesion on the posterior right hepatic lobe suggestive of a hemangioma. Status post hysterectomy. No ovarian masses. Moderate ascites. Omental caking seen in the lateral left abdomen and pelvis. Peritoneal nodularity in the left lateral pelvic cul-de-sac. No gross extrinsic compression on the bladder was identified on imaging.  The patient was then seen by her gastroenterologist, Dr. Collene Mares, who performed a colonoscopy which was unremarkable.  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 She was treated adjuvantly with carboplatin and paclitaxel, as detailed below. Her CA 125 dropped from 1226 on 03/20/2015 to 26.1 by 06/16/2015.  Her subsequent history  is as detailed above.   PAST MEDICAL HISTORY: Past Medical History:  Diagnosis Date  . Acute sensory neuropathy 06/26/2015  .  Allergy   . Anemia   . Asthma    illness induced asthma  . Blood transfusion without reported diagnosis    at age 31 years old D/T surgery to femur being crushed  . 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 Heart murmur    pt, states had a "working heart murmur"  . History of Ramsey stones   . Neutropenia, drug-induced ([place]) 03/10/2018  . PONV (postoperative nausea and vomiting)   . Skin cancer     PAST SURGICAL HISTORY: Past Surgical History:  Procedure Laterality Date  . 3 laporscopic proceedures    . ABDOMINAL HYSTERECTOMY     2010  . CHOLECYSTECTOMY     2010  . DILATION AND CURETTAGE OF UTERUS     1997  . IR IMAGING GUIDED PORT INSERTION  03/08/2018  . LAPAROSCOPY N/A 03/27/2015   Procedure: DIAGNOSTIC LAPAROSCOPY ;  Surgeon: [name], MD;  Location: [facility];  Service: Gynecology;  Laterality: N/A;  . LAPAROSCOPY N/A 02/24/2016   Procedure: LAPAROSCOPY DIAGNOSTIC WITH PERITONEAL WASHINGS AND PERITONEAL BIOPSY;  Surgeon: [name], MD;  Location: [facility];  Service: Gynecology;  Laterality: N/A;  . LAPAROTOMY N/A 03/27/2015   Procedure: EXPLORATORY LAPAROTOMY, BILATERAL SALPINGO OOPHORECTOMY, OMENTECTOMY, RADICAL TUMOR DEBULKING;  Surgeon: [name], MD;  Location: [facility];  Service: Gynecology;  Laterality: N/A;  . sinus surgery 1994      FAMILY HISTORY Family History  Problem Relation Age of Onset  . Hypertension Father   . Skin cancer Father        nonmelanoma skin cancers in his late 2s  . Non-Hodgkin's lymphoma Maternal Aunt        dx. 40s; smoker  . Stroke Maternal Grandmother   . Other Mother        benign meningioma dx. early-mid-70s; hysterectomy in her late 75s for heavy periods - still has ovaries  . Other Son        one son with pre-cancerous skin findings  . Other Daughter        cysts on ovaries and hx of heavy periods  . Other Sister        hysterectomy for cysts - still has ovaries   . Heart attack Maternal Grandfather   . Heart attack Paternal Grandfather   . Renal cancer Maternal Aunt 60       smoker  . Breast cancer Maternal Aunt 78  . Other Maternal Aunt        dx. benign brain tumor (meningioma) at 66; 2nd benign brain tumor in her late 58s; hx of radical hysterectomy at age 61  . Brain cancer Other        NOS tumor  The patient's parents are both living, in their late 56s as of January 2018. The patient has one brother, 3 sisters. There is no history of ovarian cancer in the family. One maternal aunt had breast and Ramsey cancer, another had non-Hodgkin's lymphoma.  GYNECOLOGIC HISTORY:  No LMP recorded. Patient has had a hysterectomy. 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 Her husband [name] is a Engineer, maintenance (IT). Their children are 23, 28, 77 and 68 y/o  as of JAN 2018. The oldest works as an Electrical engineer in the [employer] in [place]. The other 3 children live in [place], the youngest currently attending [employer]. The patient has no grandchildren. She attends [employer]     ADVANCED DIRECTIVES:    HEALTH MAINTENANCE: Social History   Tobacco Use  . Smoking status: Never Smoker  . Smokeless tobacco: Never Used  Substance Use Topics  . Alcohol use: Yes    Comment:  3 glasses wine or beer/week  . Drug use: No     Colonoscopy:2016  PAP: s/p hyst  Bone density:   Allergies  Allergen Reactions  . Bee Venom Shortness Of Breath    swelling  . Compazine [Prochlorperazine Edisylate] Anaphylaxis, Anxiety and Palpitations  . Sulfa Antibiotics Shortness Of Breath    Vomit , diarrhea , hives  . Zarxio [Filgrastim]     Current Outpatient Medications  Medication Sig Dispense Refill  . dexamethasone (DECADRON) 4 MG tablet 2 TABS BY MOUTH ONCE A DAY ON THE DAY AFTER CISPLATIN CHEMO, THEN 2 TABS 2X/DAY FOR 2 DAYS. 30 tablet 0  . diphenhydrAMINE  (BENADRYL) 50 MG capsule 50 mg Diphenhydramine (Benadryl) PO within 1 hour of the injection 1 capsule 0  . fluticasone (FLONASE) 50 MCG/ACT nasal spray Place 1 spray into both nostrils 2 (two) times daily. (Patient taking differently: Place 1 spray into both nostrils as needed. ) 16 g 2  . gabapentin (NEURONTIN) 100 MG capsule Take 1 capsule (100 mg total) by mouth 3 (three) times daily. 270 capsule 3  . lidocaine-prilocaine (EMLA) cream APPLY TOPICALLY AS NEEDED. 30 g 0  . loratadine (CLARITIN) 10 MG tablet Take 10 mg by mouth as needed.     . magnesium oxide (MAG-OX) 400 (241.3 Mg) MG tablet Take 1 tablet (400 mg total) by mouth daily. 60 tablet 3  . omeprazole (PRILOSEC) 40 MG capsule Take 1 capsule (40 mg total) by mouth daily. 30 capsule 5  . ondansetron (ZOFRAN) 8 MG tablet Take 1 tablet (8 mg total) by mouth 2 (two) times daily as needed. Start on the third day after cisplatin chemotherapy. 30 tablet 1  . prochlorperazine (COMPAZINE) 10 MG tablet Take 1 tablet (10 mg total) by mouth every 6 (six) hours as needed (Nausea or vomiting). 30 tablet 1  . sertraline (ZOLOFT) 50 MG tablet TAKE 1 TABLET ONCE DAILY. 30 tablet 2  . traMADol (ULTRAM) 50 MG tablet Take 1 tablet (50 mg total) by mouth every 6 (six) hours as needed. 30 tablet 0   No current facility-administered medications for this visit.     OBJECTIVE: Middle-aged white woman who appears stated age 53:   05/22/18 0951  BP: 130/68  Pulse: 63  Resp: 18  Temp: 98.6 F (37 C)  SpO2: 100%     Body mass index is 23.24 kg/m.   ECOG FS:1 - Symptomatic but completely ambulatory  Sclerae unicteric, EOMs intact No cervical or supraclavicular adenopathy Lungs no rales or rhonchi Heart regular rate and rhythm Abd soft, nontender, positive bowel sounds, no obvious recurrence of her port site metastases MSK no focal spinal tenderness, no upper extremity lymphedema Neuro: nonfocal, well oriented, appropriate affect Breasts: Deferred      LAB RESULTS:  CMP     Component Value Date/Time   NA 138 05/08/2018 0827   NA 140 02/11/2017 0755   K 4.4 05/08/2018 0827   K 4.4 02/11/2017 0755   CL 106 05/08/2018 0827   CO2 22 05/08/2018  0827   CO2 26 02/11/2017 0755   GLUCOSE 105 (H) 05/08/2018 0827   GLUCOSE 93 02/11/2017 0755   BUN 13 05/08/2018 0827   BUN 7.9 02/11/2017 0755   CREATININE 0.74 05/08/2018 0827   CREATININE 0.7 02/11/2017 0755   CALCIUM 8.6 (L) 05/08/2018 0827   CALCIUM 9.6 02/11/2017 0755   PROT 6.9 05/08/2018 0827   PROT 7.5 02/11/2017 0755   ALBUMIN 3.9 05/08/2018 0827   ALBUMIN 4.4 02/11/2017 0755   AST 22 05/08/2018 0827   AST 27 02/11/2017 0755   ALT 25 05/08/2018 0827   ALT 24 02/11/2017 0755   ALKPHOS 67 05/08/2018 0827   ALKPHOS 58 02/11/2017 0755   BILITOT 0.3 05/08/2018 0827   BILITOT 0.43 02/11/2017 0755   GFRNONAA >60 05/08/2018 0827   GFRAA >60 05/08/2018 0827    INo results found for: SPEP, UPEP  Lab Results  Component Value Date   WBC 4.3 05/22/2018   NEUTROABS 2.0 05/22/2018   HGB 8.6 (L) 05/22/2018   HCT 26.9 (L) 05/22/2018   MCV 108.5 (H) 05/22/2018   PLT 164 05/22/2018      Chemistry      Component Value Date/Time   NA 138 05/08/2018 0827   NA 140 02/11/2017 0755   K 4.4 05/08/2018 0827   K 4.4 02/11/2017 0755   CL 106 05/08/2018 0827   CO2 22 05/08/2018 0827   CO2 26 02/11/2017 0755   BUN 13 05/08/2018 0827   BUN 7.9 02/11/2017 0755   CREATININE 0.74 05/08/2018 0827   CREATININE 0.7 02/11/2017 0755      Component Value Date/Time   CALCIUM 8.6 (L) 05/08/2018 0827   CALCIUM 9.6 02/11/2017 0755   ALKPHOS 67 05/08/2018 0827   ALKPHOS 58 02/11/2017 0755   AST 22 05/08/2018 0827   AST 27 02/11/2017 0755   ALT 25 05/08/2018 0827   ALT 24 02/11/2017 0755   BILITOT 0.3 05/08/2018 0827   BILITOT 0.43 02/11/2017 0755       No results found for: LABCA2  No components found for: LABCA125  No results for input(s): INR in the last 168 hours.   Urinalysis    Component Value Date/Time   COLORURINE STRAW (A) 11/22/2017 0940   APPEARANCEUR CLEAR 11/22/2017 0940   LABSPEC 1.002 (L) 11/22/2017 0940   LABSPEC 1.005 10/09/2015 1239   PHURINE 7.0 11/22/2017 0940   GLUCOSEU NEGATIVE 11/22/2017 0940   GLUCOSEU Negative 10/09/2015 1239   HGBUR NEGATIVE 11/22/2017 0940   BILIRUBINUR NEGATIVE 11/22/2017 0940   BILIRUBINUR Negative 10/09/2015 1239   KETONESUR NEGATIVE 11/22/2017 0940   PROTEINUR NEGATIVE 11/22/2017 0940   UROBILINOGEN 0.2 10/09/2015 1239   NITRITE NEGATIVE 11/22/2017 0940   LEUKOCYTESUR NEGATIVE 11/22/2017 0940   LEUKOCYTESUR Negative 10/09/2015 1239    STUDIES: No results found.   ELIGIBLE FOR AVAILABLE RESEARCH PROTOCOL:  discussing protocols at [facility] [place]: 53 y.o. BRCA negative [place] woman status post radical tumor debulking with optimal cytoreduction (R0) 03/27/2015 for a stage IIIC, high-grade right fallopian tube carcinoma  (a) baseline CA-125 was 1226.  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)  (c) tumor is PD-L1 negative (02/24/2016)  (d) tumor is strongly estrogen receptor positive, progesterone receptor negative (02/24/2016)  (1) adjuvant chemotherapy consisted of carboplatin and paclitaxel for 6 doses, begun 04/14/2015, completed 08/11/2015  (a) paclitaxel was omitted from  cycle 5 and dose reduced on cycle 6 because of neuropathy   (b) a "make up" dose of paclitaxel was given 08/25/2015  (c) last carboplatin dose was 08/11/2015  (d) CA-125 normalized by 06/16/2015   (2) FIRST RECURRENCE: January 2018  (a) CA-125 rise beginning November 2017 led to CT scans and PET scans December 2017, all negative  (b) exploratory laparotomy 02/24/2016 showed pathologically confirmed recurrence, with miliary disease  involving all examined surfaces  (c) port-site metastases noted 03/08/2016  (d) the January 2018 tumor sample was tested for the estrogen receptor and he was 80 percent positive with strong staining, progesterone receptor negative  (3) carboplatin/liposomal doxorubicin started 03/05/2016, repeated every three weeks x4, completed 06/04/2016.  (a) cycle 3 delayed one week because of neutropenia; OnPro added  (b) CA 125 normalized after cycle 3  (4) RUCAPARIB maintenance started 07/02/2016  (a) restaging 10/01/2016: Normal CA 125, negative CT of the abdomen and pelvis  (b) labs 11/24/2016 shows a rise in her CA 125-43.4, with continuing rise thereafter  (c) rucaparib discontinued October 2018 with rising CA 125  (5) anastrozole started December 23, 2016-stopped after a couple of weeks with poor tolerance  SECOND RECURRENCE: (6) Gemcitabine/Bevacizumab started on 02/04/2017  (a) gemcitabine omitted cycle 4 because of intercurrent infection  (b) gemcitabine resumed with cycle 5, with intervening rise in  Ca 125  (c) repeat CT scan of the abdomen and pelvis on 05/26/2017 does not confirm obvious disease progression  (d) cycle 5 of gemcitabine/bevacizumab delayed 1 week   (e) gemcitabine/ bevacizumab discontinued after 6 cycles, with rising CA 125 (last dose 06/24/2017)  (7) foundation one testing did not show a high mutation burden and the microsatellite status was stable.  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  These were not immediately actionable  (8) Abraxane started 07/12/2017, given weekly or weeks 1 and 2 of each 3 weeks cycle, last dose 11/15/2017 (5 cycles)  (a) cycle 2 started 08/09/2017, will be day 1 day 8 only  (b) cycle 3 scheduled to start 09/13/2017, day 1 day 8 at patient request  (c) CT of the abdomen and pelvis 11/28/2017 shows no measurable disease  (d) PET scan 01/25/2018 and MRI of the pelvis 02/03/2018 show no measurable disease  (e) patient has symptomatic  ascites and a rapidly rising Ca1 25  (9) started cisplatin/gemcitabine days 1 and 8 of each 21-day cycle on 02/18/2018   PLAN: Kathryn Ramsey is tolerating her chemotherapy actually remarkably well.  I think the main reason for her fatigue is not so much of the chemotherapy although that certainly is contributing, but the anemia.  We are currently not transfusing for that indication unless the hemoglobin is less than 7.  We can however start Aranesp and see if that helps her get through her chemotherapy treatments with fewer symptoms.  She will receive her first dose on 05/25/2018.  She understands the possible side effects toxicities and complications of this agent and also the fact that it may not work for some time.  I am adding a ferritin to her labs today  We discussed how long we are going to be continuing this treatment and the plan is to get her Ca125 to the normal level and then do 2 cycles (4 doses) beyond that.  She is already looking ahead to that time and I would be interested in the thoughts of her other physicians including Dr. Theora Gianotti regarding what we would try for maintenance.  Recall Lelan Pons had  rucaparib previously and it did not seem to make much of a difference.  We again discussed how to manage constipation.  She is having a variety of symptoms related to the epigastrium and right upper quadrant which may be related to tumor or could be related to scar tissue or could be simply related to reflux.  We went over the hypersensitivity she sometimes experiences on her back which is not a form of peripheral neuropathy but could be well related to nerve issues from chemotherapy  For her minimal mouth sore I am starting her on Valtrex 1000 mg daily for the next 5 days  She will see me again in 3 weeks with the beginning of her next cycle.  She knows to call for any other problem that may develop before the next visit.     Chauncey Cruel, MD Medical Oncology and Hematology [facility] [address] [place], [place] [address] Tel. [phone]    Fax. [phone]  I, Wilburn Mylar, am acting as scribe for Dr. Virgie Dad. Magrinat.  I, Lurline Del MD, have reviewed the above documentation for accuracy and completeness, and I agree with the above.   ADDENDUM: I discussed Kathryn Ramsey's situation with Dr. Theora Gianotti.  She felt in the PA RP studies the control arm usually had about 5 months before requiring treatment, whereas responders and the PA RP arm of course had much longer.  The point that she does not believe Kathryn Ramsey got any benefit at all from the rucaparib.  Her suggestion is that once we stop chemo she take a drug holiday.  She does not have any "magic tricks" in her back pocket at this point

## 2018-05-22 ENCOUNTER — Inpatient Hospital Stay: Payer: 59

## 2018-05-22 ENCOUNTER — Inpatient Hospital Stay: Payer: 59 | Attending: Oncology

## 2018-05-22 ENCOUNTER — Inpatient Hospital Stay (HOSPITAL_BASED_OUTPATIENT_CLINIC_OR_DEPARTMENT_OTHER): Payer: 59 | Admitting: Oncology

## 2018-05-22 ENCOUNTER — Other Ambulatory Visit: Payer: Self-pay

## 2018-05-22 VITALS — BP 130/68 | HR 63 | Temp 98.6°F | Resp 18 | Ht 63.0 in | Wt 131.2 lb

## 2018-05-22 DIAGNOSIS — D701 Agranulocytosis secondary to cancer chemotherapy: Secondary | ICD-10-CM | POA: Insufficient documentation

## 2018-05-22 DIAGNOSIS — C5701 Malignant neoplasm of right fallopian tube: Secondary | ICD-10-CM

## 2018-05-22 DIAGNOSIS — D702 Other drug-induced agranulocytosis: Secondary | ICD-10-CM

## 2018-05-22 DIAGNOSIS — Z8249 Family history of ischemic heart disease and other diseases of the circulatory system: Secondary | ICD-10-CM | POA: Diagnosis not present

## 2018-05-22 DIAGNOSIS — R5383 Other fatigue: Secondary | ICD-10-CM | POA: Insufficient documentation

## 2018-05-22 DIAGNOSIS — K1379 Other lesions of oral mucosa: Secondary | ICD-10-CM | POA: Insufficient documentation

## 2018-05-22 DIAGNOSIS — Z79899 Other long term (current) drug therapy: Secondary | ICD-10-CM | POA: Diagnosis not present

## 2018-05-22 DIAGNOSIS — C762 Malignant neoplasm of abdomen: Secondary | ICD-10-CM

## 2018-05-22 DIAGNOSIS — T451X5A Adverse effect of antineoplastic and immunosuppressive drugs, initial encounter: Secondary | ICD-10-CM

## 2018-05-22 DIAGNOSIS — C786 Secondary malignant neoplasm of retroperitoneum and peritoneum: Secondary | ICD-10-CM | POA: Diagnosis not present

## 2018-05-22 DIAGNOSIS — R188 Other ascites: Secondary | ICD-10-CM | POA: Insufficient documentation

## 2018-05-22 DIAGNOSIS — D649 Anemia, unspecified: Secondary | ICD-10-CM | POA: Diagnosis not present

## 2018-05-22 DIAGNOSIS — K219 Gastro-esophageal reflux disease without esophagitis: Secondary | ICD-10-CM | POA: Diagnosis not present

## 2018-05-22 DIAGNOSIS — Z5189 Encounter for other specified aftercare: Secondary | ICD-10-CM | POA: Diagnosis present

## 2018-05-22 DIAGNOSIS — C801 Malignant (primary) neoplasm, unspecified: Principal | ICD-10-CM

## 2018-05-22 DIAGNOSIS — K5900 Constipation, unspecified: Secondary | ICD-10-CM | POA: Insufficient documentation

## 2018-05-22 DIAGNOSIS — Z7951 Long term (current) use of inhaled steroids: Secondary | ICD-10-CM | POA: Diagnosis not present

## 2018-05-22 DIAGNOSIS — D6481 Anemia due to antineoplastic chemotherapy: Secondary | ICD-10-CM

## 2018-05-22 DIAGNOSIS — Z5111 Encounter for antineoplastic chemotherapy: Secondary | ICD-10-CM | POA: Insufficient documentation

## 2018-05-22 DIAGNOSIS — Z7189 Other specified counseling: Secondary | ICD-10-CM

## 2018-05-22 DIAGNOSIS — Z85828 Personal history of other malignant neoplasm of skin: Secondary | ICD-10-CM

## 2018-05-22 DIAGNOSIS — R140 Abdominal distension (gaseous): Secondary | ICD-10-CM | POA: Insufficient documentation

## 2018-05-22 DIAGNOSIS — G620 Drug-induced polyneuropathy: Secondary | ICD-10-CM

## 2018-05-22 DIAGNOSIS — Z95828 Presence of other vascular implants and grafts: Secondary | ICD-10-CM

## 2018-05-22 LAB — CBC WITH DIFFERENTIAL/PLATELET
Abs Immature Granulocytes: 0.05 10*3/uL (ref 0.00–0.07)
Basophils Absolute: 0 10*3/uL (ref 0.0–0.1)
Basophils Relative: 0 %
Eosinophils Absolute: 0 10*3/uL (ref 0.0–0.5)
Eosinophils Relative: 0 %
HCT: 26.9 % — ABNORMAL LOW (ref 36.0–46.0)
Hemoglobin: 8.6 g/dL — ABNORMAL LOW (ref 12.0–15.0)
Immature Granulocytes: 1 %
Lymphocytes Relative: 38 %
Lymphs Abs: 1.6 10*3/uL (ref 0.7–4.0)
MCH: 34.7 pg — ABNORMAL HIGH (ref 26.0–34.0)
MCHC: 32 g/dL (ref 30.0–36.0)
MCV: 108.5 fL — ABNORMAL HIGH (ref 80.0–100.0)
Monocytes Absolute: 0.6 10*3/uL (ref 0.1–1.0)
Monocytes Relative: 13 %
Neutro Abs: 2 10*3/uL (ref 1.7–7.7)
Neutrophils Relative %: 48 %
Platelets: 164 10*3/uL (ref 150–400)
RBC: 2.48 MIL/uL — ABNORMAL LOW (ref 3.87–5.11)
RDW: 21.1 % — ABNORMAL HIGH (ref 11.5–15.5)
WBC: 4.3 10*3/uL (ref 4.0–10.5)
nRBC: 0 % (ref 0.0–0.2)

## 2018-05-22 LAB — COMPREHENSIVE METABOLIC PANEL
ALT: 19 U/L (ref 0–44)
AST: 20 U/L (ref 15–41)
Albumin: 4.2 g/dL (ref 3.5–5.0)
Alkaline Phosphatase: 90 U/L (ref 38–126)
Anion gap: 8 (ref 5–15)
BUN: 11 mg/dL (ref 6–20)
CO2: 24 mmol/L (ref 22–32)
Calcium: 9.1 mg/dL (ref 8.9–10.3)
Chloride: 103 mmol/L (ref 98–111)
Creatinine, Ser: 0.8 mg/dL (ref 0.44–1.00)
GFR calc Af Amer: >60 mL/min (ref >60)
GFR calc non Af Amer: >60 mL/min (ref >60)
Glucose, Bld: 89 mg/dL (ref 70–99)
Potassium: 4.3 mmol/L (ref 3.5–5.1)
Sodium: 135 mmol/L (ref 135–145)
Total Bilirubin: 0.3 mg/dL (ref 0.3–1.2)
Total Protein: 7.2 g/dL (ref 6.5–8.1)

## 2018-05-22 LAB — MAGNESIUM: Magnesium: 1.9 mg/dL (ref 1.7–2.4)

## 2018-05-22 MED ORDER — SODIUM CHLORIDE 0.9% FLUSH
10.0000 mL | INTRAVENOUS | Status: DC | PRN
  Administered 2018-05-22: 10 mL
  Filled 2018-05-22: qty 10

## 2018-05-22 MED ORDER — OMEPRAZOLE 40 MG PO CPDR: 40.0000 mg | DELAYED_RELEASE_CAPSULE | Freq: Every day | ORAL | 5 refills | Status: DC

## 2018-05-22 MED ORDER — POTASSIUM CHLORIDE 2 MEQ/ML IV SOLN
Freq: Once | INTRAVENOUS | Status: AC
  Administered 2018-05-22: 11:00:00 via INTRAVENOUS
  Filled 2018-05-22: qty 10

## 2018-05-22 MED ORDER — PEGFILGRASTIM 6 MG/0.6ML ~~LOC~~ PSKT
PREFILLED_SYRINGE | SUBCUTANEOUS | Status: AC
  Filled 2018-05-22: qty 0.6

## 2018-05-22 MED ORDER — VALACYCLOVIR HCL 1 G PO TABS: 1000.0000 mg | ORAL_TABLET | Freq: Every day | ORAL | 0 refills | Status: DC

## 2018-05-22 MED ORDER — SODIUM CHLORIDE 0.9 % IV SOLN
Freq: Once | INTRAVENOUS | Status: AC
  Administered 2018-05-22: 11:00:00 via INTRAVENOUS
  Filled 2018-05-22: qty 250

## 2018-05-22 MED ORDER — SODIUM CHLORIDE 0.9 % IV SOLN
800.0000 mg/m2 | Freq: Once | INTRAVENOUS | Status: AC
  Administered 2018-05-22: 14:00:00 1254 mg via INTRAVENOUS
  Filled 2018-05-22: qty 32.98

## 2018-05-22 MED ORDER — PALONOSETRON HCL INJECTION 0.25 MG/5ML
0.2500 mg | Freq: Once | INTRAVENOUS | Status: AC
  Administered 2018-05-22: 0.25 mg via INTRAVENOUS

## 2018-05-22 MED ORDER — PALONOSETRON HCL INJECTION 0.25 MG/5ML
INTRAVENOUS | Status: AC
  Filled 2018-05-22: qty 5

## 2018-05-22 MED ORDER — SODIUM CHLORIDE 0.9 % IV SOLN
40.0000 mg/m2 | Freq: Once | INTRAVENOUS | Status: AC
  Administered 2018-05-22: 15:00:00 63 mg via INTRAVENOUS
  Filled 2018-05-22: qty 63

## 2018-05-22 MED ORDER — SODIUM CHLORIDE 0.9 % IV SOLN
Freq: Once | INTRAVENOUS | Status: AC
  Administered 2018-05-22: 13:00:00 via INTRAVENOUS
  Filled 2018-05-22: qty 5

## 2018-05-22 MED ORDER — HEPARIN SOD (PORK) LOCK FLUSH 100 UNIT/ML IV SOLN
500.0000 [IU] | Freq: Once | INTRAVENOUS | Status: AC | PRN
  Administered 2018-05-22: 500 [IU]
  Filled 2018-05-22: qty 5

## 2018-05-22 MED ORDER — SODIUM CHLORIDE 0.9% FLUSH
10.0000 mL | Freq: Once | INTRAVENOUS | Status: AC
  Administered 2018-05-22: 10 mL
  Filled 2018-05-22: qty 10

## 2018-05-22 MED ORDER — OMEPRAZOLE 40 MG PO CPDR: 40.0000 mg | DELAYED_RELEASE_CAPSULE | Freq: Every day | ORAL | 5 refills | Status: AC

## 2018-05-22 NOTE — Patient Instructions (Signed)
Coronavirus (COVID-19) Are you at risk?  Are you at risk for the Coronavirus (COVID-19)?  To be considered HIGH RISK for Coronavirus (COVID-19), you have to meet the following criteria:  . Traveled to [place], [place], [name], [place] or [place]; or in the [place] to [place], [place], [place], or [place]; and have fever, cough, and shortness of breath within the last 2 weeks of travel OR . Been in close contact with a person diagnosed with COVID-19 within the last 2 weeks and have fever, cough, and shortness of breath . IF YOU DO NOT MEET THESE CRITERIA, YOU ARE CONSIDERED LOW RISK FOR COVID-19.  What to do if you are HIGH RISK for COVID-19?  . If you are having a medical emergency, call 911. . Seek medical care right away. Before you go to a doctor's office, urgent care or emergency department, call ahead and tell them about your recent travel, contact with someone diagnosed with COVID-19, and your symptoms. You should receive instructions from your physician's office regarding next steps of care.  . When you arrive at healthcare provider, tell the healthcare staff immediately you have returned from visiting [place], [place], [place], [place] or [name]; or traveled in the [place] to [place], [place], [place], or [place]; in the last two weeks or you have been in close contact with a person diagnosed with COVID-19 in the last 2 weeks.   . Tell the health care staff about your symptoms: fever, cough and shortness of breath. . After you have been seen by a medical provider, you will be either: o Tested for (COVID-19) and discharged home on quarantine except to seek medical care if symptoms worsen, and asked to  - Stay home and avoid contact with others until you get your results (4-5 days)  - Avoid travel on public transportation if possible (such as bus, train, or airplane) or o Sent to the Emergency Department by EMS for evaluation, COVID-19 testing, and possible  admission depending on your condition and test results.  What to do if you are LOW RISK for COVID-19?  Reduce your risk of any infection by using the same precautions used for avoiding the common cold or flu:  . Wash your hands often with soap and warm water for at least 20 seconds.  If soap and water are not readily available, use an alcohol-based hand sanitizer with at least 60% alcohol.  . If coughing or sneezing, cover your mouth and nose by coughing or sneezing into the elbow areas of your shirt or coat, into a tissue or into your sleeve (not your hands). . Avoid shaking hands with others and consider head nods or verbal greetings only. . Avoid touching your eyes, nose, or mouth with unwashed hands.  . Avoid close contact with people who are sick. . Avoid places or events with large numbers of people in one location, like concerts or sporting events. . Carefully consider travel plans you have or are making. . If you are planning any travel outside or inside the [place], visit the CDC's Travelers' Health webpage for the latest health notices. . If you have some symptoms but not all symptoms, continue to monitor at home and seek medical attention if your symptoms worsen. . If you are having a medical emergency, call 911.   ADDITIONAL HEALTHCARE OPTIONS FOR PATIENTS  [facility] / e-Visit: [email or web address]         [facility]: [phone]  [facility]: [phone]                   [facility]: [phone]   [facility] Discharge Instructions for Patients Receiving Chemotherapy  Today you received the following chemotherapy agents: Gemcitabine (Gemzar) and Cisplatin (Platinol)  To help prevent nausea and vomiting after your treatment, we encourage you to take your nausea medication as directed.    If you develop nausea and vomiting that is not controlled by your nausea  medication, call the clinic.   BELOW ARE SYMPTOMS THAT SHOULD BE REPORTED IMMEDIATELY:  *FEVER GREATER THAN 100.5 F  *CHILLS WITH OR WITHOUT FEVER  NAUSEA AND VOMITING THAT IS NOT CONTROLLED WITH YOUR NAUSEA MEDICATION  *UNUSUAL SHORTNESS OF BREATH  *UNUSUAL BRUISING OR BLEEDING  TENDERNESS IN MOUTH AND THROAT WITH OR WITHOUT PRESENCE OF ULCERS  *URINARY PROBLEMS  *BOWEL PROBLEMS  UNUSUAL RASH Items with * indicate a potential emergency and should be followed up as soon as possible.  Feel free to call the clinic should you have any questions or concerns. The clinic phone number is [phone].  Please show the CHEMO ALERT CARD at check-in to the Emergency Department and triage nurse.   

## 2018-05-23 ENCOUNTER — Encounter: Payer: Self-pay | Admitting: Adult Health

## 2018-05-23 ENCOUNTER — Other Ambulatory Visit: Payer: Self-pay | Admitting: Oncology

## 2018-05-23 DIAGNOSIS — C762 Malignant neoplasm of abdomen: Secondary | ICD-10-CM

## 2018-05-23 DIAGNOSIS — C786 Secondary malignant neoplasm of retroperitoneum and peritoneum: Secondary | ICD-10-CM

## 2018-05-23 DIAGNOSIS — C801 Malignant (primary) neoplasm, unspecified: Principal | ICD-10-CM

## 2018-05-23 DIAGNOSIS — C5701 Malignant neoplasm of right fallopian tube: Secondary | ICD-10-CM

## 2018-05-23 DIAGNOSIS — Z7189 Other specified counseling: Secondary | ICD-10-CM

## 2018-05-23 LAB — CA 125: Cancer Antigen (CA) 125: 101 U/mL — ABNORMAL HIGH (ref 0.0–38.1)

## 2018-05-23 LAB — FERRITIN: Ferritin: 192 ng/mL (ref 11–307)

## 2018-05-24 ENCOUNTER — Other Ambulatory Visit: Payer: Self-pay

## 2018-05-24 ENCOUNTER — Inpatient Hospital Stay: Payer: 59

## 2018-05-24 VITALS — BP 122/65 | HR 61 | Temp 98.3°F | Resp 18

## 2018-05-24 DIAGNOSIS — D702 Other drug-induced agranulocytosis: Secondary | ICD-10-CM

## 2018-05-24 DIAGNOSIS — Z95828 Presence of other vascular implants and grafts: Secondary | ICD-10-CM

## 2018-05-24 DIAGNOSIS — T451X5A Adverse effect of antineoplastic and immunosuppressive drugs, initial encounter: Secondary | ICD-10-CM

## 2018-05-24 DIAGNOSIS — C5701 Malignant neoplasm of right fallopian tube: Secondary | ICD-10-CM | POA: Diagnosis not present

## 2018-05-24 DIAGNOSIS — C762 Malignant neoplasm of abdomen: Secondary | ICD-10-CM

## 2018-05-24 DIAGNOSIS — D701 Agranulocytosis secondary to cancer chemotherapy: Secondary | ICD-10-CM

## 2018-05-24 MED ORDER — DARBEPOETIN ALFA 300 MCG/0.6ML IJ SOSY
PREFILLED_SYRINGE | INTRAMUSCULAR | Status: AC
  Filled 2018-05-24: qty 0.6

## 2018-05-24 MED ORDER — TBO-FILGRASTIM 480 MCG/0.8ML ~~LOC~~ SOSY
PREFILLED_SYRINGE | SUBCUTANEOUS | Status: AC
  Filled 2018-05-24: qty 0.8

## 2018-05-24 MED ORDER — DARBEPOETIN ALFA 300 MCG/0.6ML IJ SOSY
300.0000 ug | PREFILLED_SYRINGE | Freq: Once | INTRAMUSCULAR | Status: AC
  Administered 2018-05-24: 300 ug via SUBCUTANEOUS

## 2018-05-24 MED ORDER — TBO-FILGRASTIM 480 MCG/0.8ML ~~LOC~~ SOSY
480.0000 ug | PREFILLED_SYRINGE | Freq: Once | SUBCUTANEOUS | Status: AC
  Administered 2018-05-24: 480 ug via SUBCUTANEOUS

## 2018-05-24 NOTE — Patient Instructions (Signed)
Tbo-Filgrastim injection What is this medicine? TBO-FILGRASTIM (T B O fil GRA stim) is a granulocyte colony-stimulating factor that stimulates the growth of neutrophils, a type of white blood cell important in the body's fight against infection. It is used to reduce the incidence of fever and infection in patients with certain types of cancer who are receiving chemotherapy that affects the bone marrow. This medicine may be used for other purposes; ask your health care provider or pharmacist if you have questions. COMMON BRAND NAME(S): Granix What should I tell my health care provider before I take this medicine? They need to know if you have any of these conditions: -bone scan or tests planned -kidney disease -sickle cell anemia -an unusual or allergic reaction to tbo-filgrastim, filgrastim, pegfilgrastim, other medicines, foods, dyes, or preservatives -pregnant or trying to get pregnant -breast-feeding How should I use this medicine? This medicine is for injection under the skin. If you get this medicine at home, you will be taught how to prepare and give this medicine. Refer to the Instructions for Use that come with your medication packaging. Use exactly as directed. Take your medicine at regular intervals. Do not take your medicine more often than directed. It is important that you put your used needles and syringes in a special sharps container. Do not put them in a trash can. If you do not have a sharps container, call your pharmacist or healthcare provider to get one. Talk to your pediatrician regarding the use of this medicine in children. While this drug may be prescribed for children as young as 1 month of age for selected conditions, precautions do apply. Overdosage: If you think you have taken too much of this medicine contact a poison control center or emergency room at once. NOTE: This medicine is only for you. Do not share this medicine with others. What if I miss a dose? It is  important not to miss your dose. Call your doctor or health care professional if you miss a dose. What may interact with this medicine? This medicine may interact with the following medications: -medicines that may cause a release of neutrophils, such as lithium This list may not describe all possible interactions. Give your health care provider a list of all the medicines, herbs, non-prescription drugs, or dietary supplements you use. Also tell them if you smoke, drink alcohol, or use illegal drugs. Some items may interact with your medicine. What should I watch for while using this medicine? You may need blood work done while you are taking this medicine. What side effects may I notice from receiving this medicine? Side effects that you should report to your doctor or health care professional as soon as possible: -allergic reactions like skin rash, itching or hives, swelling of the face, lips, or tongue -back pain -blood in the urine -dark urine -dizziness -fast heartbeat -feeling faint -shortness of breath or breathing problems -signs and symptoms of infection like fever or chills; cough; or sore throat -signs and symptoms of kidney injury like trouble passing urine or change in the amount of urine -stomach or side pain, or pain at the shoulder -sweating -swelling of the legs, ankles, or abdomen -tiredness Side effects that usually do not require medical attention (report to your doctor or health care professional if they continue or are bothersome): -bone pain -diarrhea -headache -muscle pain -vomiting This list may not describe all possible side effects. Call your doctor for medical advice about side effects. You may report side effects to FDA at   1-800-FDA-1088. Where should I keep my medicine? Keep out of the reach of children. Store in a refrigerator between 2 and 8 degrees C (36 and 46 degrees F). Keep in carton to protect from light. Throw away this medicine if it is left out  of the refrigerator for more than 5 consecutive days. Throw away any unused medicine after the expiration date. NOTE: This sheet is a summary. It may not cover all possible information. If you have questions about this medicine, talk to your doctor, pharmacist, or health care provider.  2019 [employer] Standard (2016-09-21 16:56:18)   Darbepoetin Alfa injection What is this medicine? DARBEPOETIN ALFA (dar be POE e tin AL fa) helps your body make more red blood cells. It is used to treat anemia caused by chronic kidney failure and chemotherapy. This medicine may be used for other purposes; ask your health care provider or pharmacist if you have questions. COMMON BRAND NAME(S): Aranesp What should I tell my health care provider before I take this medicine? They need to know if you have any of these conditions: -blood clotting disorders or history of blood clots -cancer patient not on chemotherapy -cystic fibrosis -heart disease, such as angina, heart failure, or a history of a heart attack -hemoglobin level of 12 g/dL or greater -high blood pressure -low levels of folate, iron, or vitamin B12 -seizures -an unusual or allergic reaction to darbepoetin, erythropoietin, albumin, hamster proteins, latex, other medicines, foods, dyes, or preservatives -pregnant or trying to get pregnant -breast-feeding How should I use this medicine? This medicine is for injection into a vein or under the skin. It is usually given by a health care professional in a hospital or clinic setting. If you get this medicine at home, you will be taught how to prepare and give this medicine. Use exactly as directed. Take your medicine at regular intervals. Do not take your medicine more often than directed. It is important that you put your used needles and syringes in a special sharps container. Do not put them in a trash can. If you do not have a sharps container, call your pharmacist or healthcare provider to get  one. A special MedGuide will be given to you by the pharmacist with each prescription and refill. Be sure to read this information carefully each time. Talk to your pediatrician regarding the use of this medicine in children. While this medicine may be used in children as young as 5 month of age for selected conditions, precautions do apply. Overdosage: If you think you have taken too much of this medicine contact a poison control center or emergency room at once. NOTE: This medicine is only for you. Do not share this medicine with others. What if I miss a dose? If you miss a dose, take it as soon as you can. If it is almost time for your next dose, take only that dose. Do not take double or extra doses. What may interact with this medicine? Do not take this medicine with any of the following medications: -epoetin alfa This list may not describe all possible interactions. Give your health care provider a list of all the medicines, herbs, non-prescription drugs, or dietary supplements you use. Also tell them if you smoke, drink alcohol, or use illegal drugs. Some items may interact with your medicine. What should I watch for while using this medicine? Your condition will be monitored carefully while you are receiving this medicine. You may need blood work done while you are taking this medicine.  This medicine may cause a decrease in vitamin B6. You should make sure that you get enough vitamin B6 while you are taking this medicine. Discuss the foods you eat and the vitamins you take with your health care professional. What side effects may I notice from receiving this medicine? Side effects that you should report to your doctor or health care professional as soon as possible: -allergic reactions like skin rash, itching or hives, swelling of the face, lips, or tongue -breathing problems -changes in vision -chest pain -confusion, trouble speaking or understanding -feeling faint or lightheaded,  falls -high blood pressure -muscle aches or pains -pain, swelling, warmth in the leg -rapid weight gain -severe headaches -sudden numbness or weakness of the face, arm or leg -trouble walking, dizziness, loss of balance or coordination -seizures (convulsions) -swelling of the ankles, feet, hands -unusually weak or tired Side effects that usually do not require medical attention (report to your doctor or health care professional if they continue or are bothersome): -diarrhea -fever, chills (flu-like symptoms) -headaches -nausea, vomiting -redness, stinging, or swelling at site where injected This list may not describe all possible side effects. Call your doctor for medical advice about side effects. You may report side effects to FDA at 1-800-FDA-1088. Where should I keep my medicine? Keep out of the reach of children. Store in a refrigerator between 2 and 8 degrees C (36 and 46 degrees F). Do not freeze. Do not shake. Throw away any unused portion if using a single-dose vial. Throw away any unused medicine after the expiration date. NOTE: This sheet is a summary. It may not cover all possible information. If you have questions about this medicine, talk to your doctor, pharmacist, or health care provider.  2019 [employer] Standard (2017-02-16 16:44:20)

## 2018-05-25 ENCOUNTER — Other Ambulatory Visit: Payer: Self-pay

## 2018-05-25 ENCOUNTER — Inpatient Hospital Stay: Payer: 59

## 2018-05-25 DIAGNOSIS — D702 Other drug-induced agranulocytosis: Secondary | ICD-10-CM

## 2018-05-25 DIAGNOSIS — C5701 Malignant neoplasm of right fallopian tube: Secondary | ICD-10-CM | POA: Diagnosis not present

## 2018-05-25 DIAGNOSIS — T451X5A Adverse effect of antineoplastic and immunosuppressive drugs, initial encounter: Secondary | ICD-10-CM

## 2018-05-25 DIAGNOSIS — Z95828 Presence of other vascular implants and grafts: Secondary | ICD-10-CM

## 2018-05-25 DIAGNOSIS — C762 Malignant neoplasm of abdomen: Secondary | ICD-10-CM

## 2018-05-25 DIAGNOSIS — D701 Agranulocytosis secondary to cancer chemotherapy: Secondary | ICD-10-CM

## 2018-05-25 MED ORDER — TBO-FILGRASTIM 480 MCG/0.8ML ~~LOC~~ SOSY
PREFILLED_SYRINGE | SUBCUTANEOUS | Status: AC
  Filled 2018-05-25: qty 0.8

## 2018-05-25 MED ORDER — TBO-FILGRASTIM 480 MCG/0.8ML ~~LOC~~ SOSY
480.0000 ug | PREFILLED_SYRINGE | Freq: Once | SUBCUTANEOUS | Status: AC
  Administered 2018-05-25: 12:00:00 480 ug via SUBCUTANEOUS

## 2018-05-29 ENCOUNTER — Inpatient Hospital Stay: Payer: 59

## 2018-05-29 ENCOUNTER — Other Ambulatory Visit: Payer: Self-pay

## 2018-05-29 VITALS — BP 113/68 | HR 66 | Temp 98.1°F | Resp 18

## 2018-05-29 DIAGNOSIS — D701 Agranulocytosis secondary to cancer chemotherapy: Secondary | ICD-10-CM

## 2018-05-29 DIAGNOSIS — Z7189 Other specified counseling: Secondary | ICD-10-CM

## 2018-05-29 DIAGNOSIS — C5701 Malignant neoplasm of right fallopian tube: Secondary | ICD-10-CM

## 2018-05-29 DIAGNOSIS — G620 Drug-induced polyneuropathy: Secondary | ICD-10-CM

## 2018-05-29 DIAGNOSIS — C762 Malignant neoplasm of abdomen: Secondary | ICD-10-CM

## 2018-05-29 DIAGNOSIS — T451X5A Adverse effect of antineoplastic and immunosuppressive drugs, initial encounter: Secondary | ICD-10-CM

## 2018-05-29 DIAGNOSIS — C801 Malignant (primary) neoplasm, unspecified: Principal | ICD-10-CM

## 2018-05-29 DIAGNOSIS — C786 Secondary malignant neoplasm of retroperitoneum and peritoneum: Secondary | ICD-10-CM

## 2018-05-29 LAB — COMPREHENSIVE METABOLIC PANEL
ALT: 21 U/L (ref 0–44)
AST: 17 U/L (ref 15–41)
Albumin: 4.1 g/dL (ref 3.5–5.0)
Alkaline Phosphatase: 87 U/L (ref 38–126)
Anion gap: 10 (ref 5–15)
BUN: 18 mg/dL (ref 6–20)
CO2: 25 mmol/L (ref 22–32)
Calcium: 9 mg/dL (ref 8.9–10.3)
Chloride: 102 mmol/L (ref 98–111)
Creatinine, Ser: 0.83 mg/dL (ref 0.44–1.00)
GFR calc Af Amer: >60 mL/min (ref >60)
GFR calc non Af Amer: >60 mL/min (ref >60)
Glucose, Bld: 87 mg/dL (ref 70–99)
Potassium: 4.6 mmol/L (ref 3.5–5.1)
Sodium: 137 mmol/L (ref 135–145)
Total Bilirubin: 0.2 mg/dL — ABNORMAL LOW (ref 0.3–1.2)
Total Protein: 7.1 g/dL (ref 6.5–8.1)

## 2018-05-29 LAB — CBC WITH DIFFERENTIAL/PLATELET
Abs Immature Granulocytes: 0.12 10*3/uL — ABNORMAL HIGH (ref 0.00–0.07)
Basophils Absolute: 0 10*3/uL (ref 0.0–0.1)
Basophils Relative: 0 %
Eosinophils Absolute: 0 10*3/uL (ref 0.0–0.5)
Eosinophils Relative: 0 %
HCT: 27.2 % — ABNORMAL LOW (ref 36.0–46.0)
Hemoglobin: 8.6 g/dL — ABNORMAL LOW (ref 12.0–15.0)
Immature Granulocytes: 3 %
Lymphocytes Relative: 48 %
Lymphs Abs: 1.7 10*3/uL (ref 0.7–4.0)
MCH: 34.4 pg — ABNORMAL HIGH (ref 26.0–34.0)
MCHC: 31.6 g/dL (ref 30.0–36.0)
MCV: 108.8 fL — ABNORMAL HIGH (ref 80.0–100.0)
Monocytes Absolute: 0.4 10*3/uL (ref 0.1–1.0)
Monocytes Relative: 12 %
Neutro Abs: 1.3 10*3/uL — ABNORMAL LOW (ref 1.7–7.7)
Neutrophils Relative %: 37 %
Platelets: 203 10*3/uL (ref 150–400)
RBC: 2.5 MIL/uL — ABNORMAL LOW (ref 3.87–5.11)
RDW: 18.7 % — ABNORMAL HIGH (ref 11.5–15.5)
WBC: 3.6 10*3/uL — ABNORMAL LOW (ref 4.0–10.5)
nRBC: 0.6 % — ABNORMAL HIGH (ref 0.0–0.2)

## 2018-05-29 MED ORDER — PALONOSETRON HCL INJECTION 0.25 MG/5ML
0.2500 mg | Freq: Once | INTRAVENOUS | Status: AC
  Administered 2018-05-29: 0.25 mg via INTRAVENOUS

## 2018-05-29 MED ORDER — PEGFILGRASTIM 6 MG/0.6ML ~~LOC~~ PSKT
PREFILLED_SYRINGE | SUBCUTANEOUS | Status: AC
  Filled 2018-05-29: qty 0.6

## 2018-05-29 MED ORDER — SODIUM CHLORIDE 0.9% FLUSH
10.0000 mL | INTRAVENOUS | Status: DC | PRN
  Administered 2018-05-29: 17:00:00 10 mL
  Filled 2018-05-29: qty 10

## 2018-05-29 MED ORDER — PEGFILGRASTIM 6 MG/0.6ML ~~LOC~~ PSKT
6.0000 mg | PREFILLED_SYRINGE | Freq: Once | SUBCUTANEOUS | Status: AC
  Administered 2018-05-29: 6 mg via SUBCUTANEOUS

## 2018-05-29 MED ORDER — SODIUM CHLORIDE 0.9 % IV SOLN
Freq: Once | INTRAVENOUS | Status: AC
  Administered 2018-05-29: 11:00:00 via INTRAVENOUS
  Filled 2018-05-29: qty 250

## 2018-05-29 MED ORDER — SODIUM CHLORIDE 0.9 % IV SOLN
Freq: Once | INTRAVENOUS | Status: AC
  Administered 2018-05-29: 13:00:00 via INTRAVENOUS
  Filled 2018-05-29: qty 5

## 2018-05-29 MED ORDER — SODIUM CHLORIDE 0.9 % IV SOLN
40.0000 mg/m2 | Freq: Once | INTRAVENOUS | Status: AC
  Administered 2018-05-29: 63 mg via INTRAVENOUS
  Filled 2018-05-29: qty 63

## 2018-05-29 MED ORDER — HEPARIN SOD (PORK) LOCK FLUSH 100 UNIT/ML IV SOLN
500.0000 [IU] | Freq: Once | INTRAVENOUS | Status: AC | PRN
  Administered 2018-05-29: 500 [IU]
  Filled 2018-05-29: qty 5

## 2018-05-29 MED ORDER — PALONOSETRON HCL INJECTION 0.25 MG/5ML
INTRAVENOUS | Status: AC
  Filled 2018-05-29: qty 5

## 2018-05-29 MED ORDER — POTASSIUM CHLORIDE 2 MEQ/ML IV SOLN
Freq: Once | INTRAVENOUS | Status: AC
  Administered 2018-05-29: 11:00:00 via INTRAVENOUS
  Filled 2018-05-29: qty 10

## 2018-05-29 MED ORDER — SODIUM CHLORIDE 0.9 % IV SOLN
800.0000 mg/m2 | Freq: Once | INTRAVENOUS | Status: AC
  Administered 2018-05-29: 1254 mg via INTRAVENOUS
  Filled 2018-05-29: qty 32.98

## 2018-05-29 NOTE — Patient Instructions (Signed)
[facility] Discharge Instructions for Patients Receiving Chemotherapy  Removed [facility] after 8:00pm on Tuesday, 04/11/18.  Today you received the following chemotherapy agents:  Gemzar and Cisplatin.  To help prevent nausea and vomiting after your treatment, we encourage you to take your nausea medication as directed.   If you develop nausea and vomiting that is not controlled by your nausea medication, call the clinic.   BELOW ARE SYMPTOMS THAT SHOULD BE REPORTED IMMEDIATELY:  *FEVER GREATER THAN 100.5 F  *CHILLS WITH OR WITHOUT FEVER  NAUSEA AND VOMITING THAT IS NOT CONTROLLED WITH YOUR NAUSEA MEDICATION  *UNUSUAL SHORTNESS OF BREATH  *UNUSUAL BRUISING OR BLEEDING  TENDERNESS IN MOUTH AND THROAT WITH OR WITHOUT PRESENCE OF ULCERS  *URINARY PROBLEMS  *BOWEL PROBLEMS  UNUSUAL RASH Items with * indicate a potential emergency and should be followed up as soon as possible.  Feel free to call the clinic should you have any questions or concerns. The clinic phone number is [phone].  Please show the [facility] at check-in to the Emergency Department and triage nurse.

## 2018-05-29 NOTE — Progress Notes (Signed)
Ok to treat today with ANC 1.3 per Dr. Jana Hakim.

## 2018-05-30 LAB — CA 125: Cancer Antigen (CA) 125: 113 U/mL — ABNORMAL HIGH (ref 0.0–38.1)

## 2018-06-07 ENCOUNTER — Ambulatory Visit: Payer: 59

## 2018-06-07 ENCOUNTER — Encounter: Payer: Self-pay | Admitting: Oncology

## 2018-06-08 ENCOUNTER — Other Ambulatory Visit: Payer: Self-pay | Admitting: Oncology

## 2018-06-09 NOTE — Progress Notes (Signed)
[place]  Telephone:([phone]) [phone] Fax:(336) [phone]     ID: Kathryn Ramsey DOB: April 07, 1965  MR#: [id number]  TLX#:[id number]  Patient Care Team: Harlan Stains, MD as PCP - General (Family Medicine) [name], MD as Consulting Physician (Obstetrics and Gynecology) Juanita Craver, MD as Consulting Physician (Gastroenterology) Servando Salina, MD as Consulting Physician (Obstetrics and Gynecology) Nickie Retort, MD as Consulting Physician (Urology) Gillis Ends, MD as Referring Physician (Obstetrics and Gynecology) OTHER MD: Festus Aloe [phone])   CHIEF COMPLAINT:  ovarian cancer  CURRENT TREATMENT: Cisplatin, gemcitabine; Aranesp   INTERVAL HISTORY: Kathryn Ramsey returns today for follow-up and treatment of her recurrent ovarian cancer. Her husband is present for our discussion via speaker phone.  She continues on cisplatin/gemcitabine given on days 1 and 8 of a 21 day cycle with granix given on days 3 and 4, and Onpro given on day 9.  Today is day 1 cycle 6.  Treatment has resulted in a remarkable drop in her tumor marker.  She has tolerated treatment moderately well although she has experienced increasing fatigue.  Part of that is due to anemia.  In addition she continues to have discomfort in the upper abdomen, which is somewhat intermittent. She also reports constipation for 10 days following her last infusion on 05/29/2018.   Results for ZOLA, RUNION (MRN [id number]) as of 06/12/2018 08:13  Ref. Range 03/09/2018 08:24 04/03/2018 09:00 05/01/2018 09:11 05/22/2018 09:52 05/29/2018 09:40  Cancer Antigen (CA) 125 Latest Ref Range: 0.0 - 38.1 U/mL 3,218.0 (H) 608.0 (H) 140.0 (H) 101.0 (H) 113.0 (H)   She also receives Aranesp every 3 weeks.  Her last dose was 05/24/2018 so she can receive that today as well  She receives Granix on 4/29 and 4/30.  Those orders are in place.  She is scheduled for screening mammogram on 08/02/2018.   REVIEW OF  SYSTEMS: Kathryn Ramsey reports doing well overall. She notes a continued pinch in her upper right quadrant that she feels has gotten worse over the last two weeks. She reports she continues to do yoga, which helps her to feel better. The patient denies unusual headaches, visual changes, nausea, vomiting, stiff neck, dizziness, or gait imbalance. There has been no cough, phlegm production, or pleurisy, no chest pain or pressure, and no change in bowel or bladder habits. The patient denies fever, rash, bleeding, unexplained fatigue or unexplained weight loss. A detailed review of systems was otherwise entirely negative.   HISTORY OF PRESENT ILLNESS: From Dr. Serita Grit original intake note 03/17/2015:  "Kathryn Ramsey is a very pleasant G4P4 who is seen in consultation at the request of Dr Collene Mares and Dr Garwin Brothers for peritoneal carcinomatosis. The patient has a history of a workup by urology for gross hematuria which included a pelvic examine was suggested for extrinsic mass effect on the bladder on cystoscopy. Cystoscopy took place on in December 2016. The patient denies abdominal pain bloating early satiety or abdominal distention.  On 08/03/2015 at [facility] she underwent a CT scan of the abdomen and pelvis as ordered by Dr Baruch Gouty. This revealed a 1.4 cm low attenuation lesion on the posterior right hepatic lobe suggestive of a hemangioma. Status post hysterectomy. No ovarian masses. Moderate ascites. Omental caking seen in the lateral left abdomen and pelvis. Peritoneal nodularity in the left lateral pelvic cul-de-sac. No gross extrinsic compression on the bladder was identified on imaging.  The patient was then seen by her gastroenterologist, Dr. Collene Mares, who performed a colonoscopy which was unremarkable.  Tumor markers were drawn on 08/04/2015 and these included a CA-125 that was elevated to 957, and a CEA that was normal at 1."  On 03/27/2015 the patient underwent diagnostic laparoscopy, exploratory  laparotomy with bilateral salpingo-oophorectomy, omentectomy, and radical tumor debulking with optimal cytoreduction (R0) of what proved to be a stage IIIc right fallopian tube cancer. She was treated adjuvantly with carboplatin and paclitaxel, as detailed below. Her CA 125 dropped from 1226 on 03/20/2015 to 26.1 by 06/16/2015.  Her subsequent history is as detailed above.   PAST MEDICAL HISTORY: Past Medical History:  Diagnosis Date  . Acute sensory neuropathy 06/26/2015  . Allergy   . Anemia   . Asthma    illness induced asthma  . Blood transfusion without reported diagnosis    at age 70 years old D/T surgery to femur being crushed  . 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 Heart murmur    pt, states had a "working heart murmur"  . History of Ramsey stones   . Neutropenia, drug-induced ([place]) 03/10/2018  . PONV (postoperative nausea and vomiting)   . Skin cancer     PAST SURGICAL HISTORY: Past Surgical History:  Procedure Laterality Date  . 3 laporscopic proceedures    . ABDOMINAL HYSTERECTOMY     2010  . CHOLECYSTECTOMY     2010  . DILATION AND CURETTAGE OF UTERUS     1997  . IR IMAGING GUIDED PORT INSERTION  03/08/2018  . LAPAROSCOPY N/A 03/27/2015   Procedure: DIAGNOSTIC LAPAROSCOPY ;  Surgeon: [name], MD;  Location: [facility];  Service: Gynecology;  Laterality: N/A;  . LAPAROSCOPY N/A 02/24/2016   Procedure: LAPAROSCOPY DIAGNOSTIC WITH PERITONEAL WASHINGS AND PERITONEAL BIOPSY;  Surgeon: [name], MD;  Location: [facility];  Service: Gynecology;  Laterality: N/A;  . LAPAROTOMY N/A 03/27/2015   Procedure: EXPLORATORY LAPAROTOMY, BILATERAL SALPINGO OOPHORECTOMY, OMENTECTOMY, RADICAL TUMOR DEBULKING;  Surgeon: [name], MD;  Location: [facility];  Service: Gynecology;  Laterality: N/A;  . sinus surgery 1994      FAMILY HISTORY Family History  Problem Relation Age of Onset  . Hypertension Father   . Skin  cancer Father        nonmelanoma skin cancers in his late 90s  . Non-Hodgkin's lymphoma Maternal Aunt        dx. 28s; smoker  . Stroke Maternal Grandmother   . Other Mother        benign meningioma dx. early-mid-70s; hysterectomy in her late 62s for heavy periods - still has ovaries  . Other Son        one son with pre-cancerous skin findings  . Other Daughter        cysts on ovaries and hx of heavy periods  . Other Sister        hysterectomy for cysts - still has ovaries  . Heart attack Maternal Grandfather   . Heart attack Paternal Grandfather   . Renal cancer Maternal Aunt 60       smoker  . Breast cancer Maternal Aunt 78  . Other Maternal Aunt        dx. benign brain tumor (meningioma) at 36; 2nd benign brain tumor in her late 31s; hx of radical hysterectomy at age 28  . Brain cancer Other        NOS tumor  The patient's parents are both living, in their late 81s as of  January 2018. The patient has one brother, 3 sisters. There is no history of ovarian cancer in the family. One maternal aunt had breast and Ramsey cancer, another had non-Hodgkin's lymphoma.  GYNECOLOGIC HISTORY:  No LMP recorded. Patient has had a hysterectomy. 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 Her husband [name] is a Engineer, maintenance (IT). Their children are 39, 88, 78 and 16 y/o as of JAN 2018. The oldest works as an Electrical engineer in the [employer] in [place]. The other 3 children live in [place], the youngest currently attending [employer]. The patient has no grandchildren. She attends [employer]     ADVANCED DIRECTIVES:    HEALTH MAINTENANCE: Social History   Tobacco Use  . Smoking status: Never Smoker  . Smokeless tobacco: Never Used  Substance Use Topics  . Alcohol use: Yes    Comment:  3 glasses wine or beer/week  . Drug use: No     Colonoscopy:2016  PAP: s/p hyst  Bone density:    Allergies  Allergen Reactions  . Bee Venom Shortness Of Breath    swelling  . Compazine [Prochlorperazine Edisylate] Anaphylaxis, Anxiety and Palpitations  . Sulfa Antibiotics Shortness Of Breath    Vomit , diarrhea , hives  . Zarxio [Filgrastim]     Current Outpatient Medications  Medication Sig Dispense Refill  . dexamethasone (DECADRON) 4 MG tablet 2 TABS BY MOUTH ONCE A DAY ON THE DAY AFTER CISPLATIN CHEMO, THEN 2 TABS 2X/DAY FOR 2 DAYS. 30 tablet 0  . diphenhydrAMINE (BENADRYL) 50 MG capsule 50 mg Diphenhydramine (Benadryl) PO within 1 hour of the injection 1 capsule 0  . fluticasone (FLONASE) 50 MCG/ACT nasal spray Place 1 spray into both nostrils 2 (two) times daily. (Patient taking differently: Place 1 spray into both nostrils as needed. ) 16 g 2  . gabapentin (NEURONTIN) 100 MG capsule Take 1 capsule (100 mg total) by mouth 3 (three) times daily. 270 capsule 3  . lidocaine-prilocaine (EMLA) cream APPLY TOPICALLY AS NEEDED. 30 g 0  . loratadine (CLARITIN) 10 MG tablet Take 10 mg by mouth as needed.     . magnesium oxide (MAG-OX) 400 (241.3 Mg) MG tablet Take 1 tablet (400 mg total) by mouth daily. 60 tablet 3  . omeprazole (PRILOSEC) 40 MG capsule Take 1 capsule (40 mg total) by mouth daily. 30 capsule 5  . ondansetron (ZOFRAN) 8 MG tablet Take 1 tablet (8 mg total) by mouth 2 (two) times daily as needed. Start on the third day after cisplatin chemotherapy. 30 tablet 1  . prochlorperazine (COMPAZINE) 10 MG tablet Take 1 tablet (10 mg total) by mouth every 6 (six) hours as needed (Nausea or vomiting). 30 tablet 1  . sertraline (ZOLOFT) 50 MG tablet TAKE 1 TABLET ONCE DAILY. 30 tablet 2  . traMADol (ULTRAM) 50 MG tablet Take 1 tablet (50 mg total) by mouth every 6 (six) hours as needed. 30 tablet 0  . valACYclovir (VALTREX) 1000 MG tablet Take 1 tablet (1,000 mg total) by mouth daily. 5 tablet 0   No current facility-administered medications for this visit.     OBJECTIVE:  Middle-aged white woman in no acute distress Vitals:   06/12/18 0925  BP: (!) 139/57  Pulse: 64  Resp: 18  Temp: 98.3 F (36.8 C)  SpO2: 100%     Body mass index is 23.19 kg/m.   ECOG FS:1 -  Symptomatic but completely ambulatory  Sclerae unicteric, pupils round and equal Wearing a mask No cervical or supraclavicular adenopathy Lungs no rales or rhonchi Heart regular rate and rhythm Abd soft, positive bowel sounds, no masses palpated in the upper abdomen, no tenderness to deep palpation, no recurrence of her port site metastases MSK no focal spinal tenderness, no upper extremity lymphedema Neuro: nonfocal, well oriented, appropriate affect Breasts: Deferred     LAB RESULTS:  CMP     Component Value Date/Time   NA 137 05/29/2018 0940   NA 140 02/11/2017 0755   K 4.6 05/29/2018 0940   K 4.4 02/11/2017 0755   CL 102 05/29/2018 0940   CO2 25 05/29/2018 0940   CO2 26 02/11/2017 0755   GLUCOSE 87 05/29/2018 0940   GLUCOSE 93 02/11/2017 0755   BUN 18 05/29/2018 0940   BUN 7.9 02/11/2017 0755   CREATININE 0.83 05/29/2018 0940   CREATININE 0.74 05/08/2018 0827   CREATININE 0.7 02/11/2017 0755   CALCIUM 9.0 05/29/2018 0940   CALCIUM 9.6 02/11/2017 0755   PROT 7.1 05/29/2018 0940   PROT 7.5 02/11/2017 0755   ALBUMIN 4.1 05/29/2018 0940   ALBUMIN 4.4 02/11/2017 0755   AST 17 05/29/2018 0940   AST 22 05/08/2018 0827   AST 27 02/11/2017 0755   ALT 21 05/29/2018 0940   ALT 25 05/08/2018 0827   ALT 24 02/11/2017 0755   ALKPHOS 87 05/29/2018 0940   ALKPHOS 58 02/11/2017 0755   BILITOT 0.2 (L) 05/29/2018 0940   BILITOT 0.3 05/08/2018 0827   BILITOT 0.43 02/11/2017 0755   GFRNONAA >60 05/29/2018 0940   GFRNONAA >60 05/08/2018 0827   GFRAA >60 05/29/2018 0940   GFRAA >60 05/08/2018 0827    INo results found for: SPEP, UPEP  Lab Results  Component Value Date   WBC 8.2 06/12/2018   NEUTROABS PENDING 06/12/2018   HGB 7.6 (L) 06/12/2018   HCT 23.7 (L) 06/12/2018    MCV 112.3 (H) 06/12/2018   PLT 158 06/12/2018      Chemistry      Component Value Date/Time   NA 137 05/29/2018 0940   NA 140 02/11/2017 0755   K 4.6 05/29/2018 0940   K 4.4 02/11/2017 0755   CL 102 05/29/2018 0940   CO2 25 05/29/2018 0940   CO2 26 02/11/2017 0755   BUN 18 05/29/2018 0940   BUN 7.9 02/11/2017 0755   CREATININE 0.83 05/29/2018 0940   CREATININE 0.74 05/08/2018 0827   CREATININE 0.7 02/11/2017 0755      Component Value Date/Time   CALCIUM 9.0 05/29/2018 0940   CALCIUM 9.6 02/11/2017 0755   ALKPHOS 87 05/29/2018 0940   ALKPHOS 58 02/11/2017 0755   AST 17 05/29/2018 0940   AST 22 05/08/2018 0827   AST 27 02/11/2017 0755   ALT 21 05/29/2018 0940   ALT 25 05/08/2018 0827   ALT 24 02/11/2017 0755   BILITOT 0.2 (L) 05/29/2018 0940   BILITOT 0.3 05/08/2018 0827   BILITOT 0.43 02/11/2017 0755       No results found for: LABCA2  No components found for: LABCA125  No results for input(s): INR in the last 168 hours.  Urinalysis    Component Value Date/Time   COLORURINE STRAW (A) 11/22/2017 0940   APPEARANCEUR CLEAR 11/22/2017 0940   LABSPEC 1.002 (L) 11/22/2017 0940   LABSPEC 1.005 10/09/2015 1239   PHURINE 7.0 11/22/2017 0940   GLUCOSEU NEGATIVE 11/22/2017 0940   GLUCOSEU Negative 10/09/2015 1239  HGBUR NEGATIVE 11/22/2017 0940   BILIRUBINUR NEGATIVE 11/22/2017 0940   BILIRUBINUR Negative 10/09/2015 1239   KETONESUR NEGATIVE 11/22/2017 0940   PROTEINUR NEGATIVE 11/22/2017 0940   UROBILINOGEN 0.2 10/09/2015 1239   NITRITE NEGATIVE 11/22/2017 0940   LEUKOCYTESUR NEGATIVE 11/22/2017 0940   LEUKOCYTESUR Negative 10/09/2015 1239    STUDIES: No results found.   ELIGIBLE FOR AVAILABLE RESEARCH PROTOCOL:  discussing protocols at [facility] at [place]: 53 y.o. BRCA negative [place] woman status post radical tumor debulking with optimal cytoreduction (R0) 03/27/2015 for a stage IIIC, high-grade right fallopian tube carcinoma  (a)  baseline CA-125 was 1226.  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)  (c) tumor is PD-L1 negative (02/24/2016)  (d) tumor is strongly estrogen receptor positive, progesterone receptor negative (02/24/2016)  (1) adjuvant chemotherapy consisted of carboplatin and paclitaxel for 6 doses, begun 04/14/2015, completed 08/11/2015  (a) paclitaxel was omitted from cycle 5 and dose reduced on cycle 6 because of neuropathy   (b) a "make up" dose of paclitaxel was given 08/25/2015  (c) last carboplatin dose was 08/11/2015  (d) CA-125 normalized by 06/16/2015   (2) FIRST RECURRENCE: January 2018  (a) CA-125 rise beginning November 2017 led to CT scans and PET scans December 2017, all negative  (b) exploratory laparotomy 02/24/2016 showed pathologically confirmed recurrence, with miliary disease involving all examined surfaces  (c) port-site metastases noted 03/08/2016  (d) the January 2018 tumor sample was tested for the estrogen receptor and he was 80 percent positive with strong staining, progesterone receptor negative  (3) carboplatin/liposomal doxorubicin started 03/05/2016, repeated every three weeks x4, completed 06/04/2016.  (a) cycle 3 delayed one week because of neutropenia; OnPro added  (b) CA 125 normalized after cycle 3  (4) RUCAPARIB maintenance started 07/02/2016  (a) restaging 10/01/2016: Normal CA 125, negative CT of the abdomen and pelvis  (b) labs 11/24/2016 shows a rise in her CA 125-43.4, with continuing rise thereafter  (c) rucaparib discontinued October 2018 with rising CA 125  (5) anastrozole started December 23, 2016-stopped after a couple of weeks with poor tolerance  SECOND RECURRENCE: (6) Gemcitabine/Bevacizumab started on 02/04/2017  (a) gemcitabine omitted cycle 4 because of intercurrent infection   (b) gemcitabine resumed with cycle 5, with intervening rise in  Ca 125  (c) repeat CT scan of the abdomen and pelvis on 05/26/2017 does not confirm obvious disease progression  (d) cycle 5 of gemcitabine/bevacizumab delayed 1 week   (e) gemcitabine/ bevacizumab discontinued after 6 cycles, with rising CA 125 (last dose 06/24/2017)  (7) foundation one testing did not show a high mutation burden and the microsatellite status was stable.  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  These were not immediately actionable  (8) Abraxane started 07/12/2017, given weekly or weeks 1 and 2 of each 3 weeks cycle, last dose 11/15/2017 (5 cycles)  (a) cycle 2 started 08/09/2017, will be day 1 day 8 only  (b) cycle 3 scheduled to start 09/13/2017, day 1 day 8 at patient request  (c) CT of the abdomen and pelvis 11/28/2017 shows no measurable disease  (d) PET scan 01/25/2018 and MRI of the pelvis 02/03/2018 show no measurable disease  (e) patient has symptomatic ascites and a rapidly rising Ca1 25  (9) started cisplatin/gemcitabine days 1 and 8 of each 21-day cycle on 02/18/2018   PLAN: Kathryn Ramsey has had  5 cycles, 10 doses of cisplatin/gemcitabine.  She has tolerated this generally well but it is beginning to wear on her.  She has had a rough past 2 weeks and is severely anemic at present.  I discussed with her Dr. Gershon Crane comments and suggested that this may be a good point to stop treatment and initiate observation.  Kathryn Ramsey certainly is comfortable not being treated today since he has had such a rough past 2 weeks.  She would like to see me again next week and consider resuming treatment versus not resuming treatment at that time.  Between now and then though it would be useful to have a scan tells for we are and I have set her up for a PET scan hopefully to be done this week.  Of course we will also have 1 more reading on her Ca1 71 which was drawn today  She will receive Aranesp today, but no chemo of  course as discussed above  We reviewed her molecular studies which are PD-L1 negative and are not suggestive of benefit from pembrolizumab or similar drugs.  She knows to call for any other issues that may develop before her next visit here.  Chauncey Cruel, MD Medical Oncology and Hematology [facility] [address] [place],  [address] Tel. [phone]    Fax. [phone]  I, Wilburn Mylar, am acting as scribe for Dr. Virgie Dad. Magrinat.  I, Lurline Del MD, have reviewed the above documentation for accuracy and completeness, and I agree with the above.

## 2018-06-12 ENCOUNTER — Other Ambulatory Visit: Payer: Self-pay

## 2018-06-12 ENCOUNTER — Inpatient Hospital Stay (HOSPITAL_BASED_OUTPATIENT_CLINIC_OR_DEPARTMENT_OTHER): Payer: 59 | Admitting: Oncology

## 2018-06-12 ENCOUNTER — Inpatient Hospital Stay: Payer: 59

## 2018-06-12 ENCOUNTER — Other Ambulatory Visit: Payer: Self-pay | Admitting: *Deleted

## 2018-06-12 VITALS — BP 139/57 | HR 64 | Temp 98.3°F | Resp 18 | Ht 63.0 in | Wt 130.9 lb

## 2018-06-12 DIAGNOSIS — C801 Malignant (primary) neoplasm, unspecified: Principal | ICD-10-CM

## 2018-06-12 DIAGNOSIS — C786 Secondary malignant neoplasm of retroperitoneum and peritoneum: Secondary | ICD-10-CM

## 2018-06-12 DIAGNOSIS — R188 Other ascites: Secondary | ICD-10-CM

## 2018-06-12 DIAGNOSIS — K5900 Constipation, unspecified: Secondary | ICD-10-CM

## 2018-06-12 DIAGNOSIS — Z95828 Presence of other vascular implants and grafts: Secondary | ICD-10-CM

## 2018-06-12 DIAGNOSIS — T451X5A Adverse effect of antineoplastic and immunosuppressive drugs, initial encounter: Secondary | ICD-10-CM

## 2018-06-12 DIAGNOSIS — D701 Agranulocytosis secondary to cancer chemotherapy: Secondary | ICD-10-CM

## 2018-06-12 DIAGNOSIS — Z7951 Long term (current) use of inhaled steroids: Secondary | ICD-10-CM

## 2018-06-12 DIAGNOSIS — C762 Malignant neoplasm of abdomen: Secondary | ICD-10-CM

## 2018-06-12 DIAGNOSIS — D649 Anemia, unspecified: Secondary | ICD-10-CM

## 2018-06-12 DIAGNOSIS — C5701 Malignant neoplasm of right fallopian tube: Secondary | ICD-10-CM

## 2018-06-12 DIAGNOSIS — Z8249 Family history of ischemic heart disease and other diseases of the circulatory system: Secondary | ICD-10-CM

## 2018-06-12 DIAGNOSIS — D702 Other drug-induced agranulocytosis: Secondary | ICD-10-CM

## 2018-06-12 DIAGNOSIS — Z79899 Other long term (current) drug therapy: Secondary | ICD-10-CM

## 2018-06-12 DIAGNOSIS — Z85828 Personal history of other malignant neoplasm of skin: Secondary | ICD-10-CM

## 2018-06-12 DIAGNOSIS — R5383 Other fatigue: Secondary | ICD-10-CM

## 2018-06-12 LAB — CBC WITH DIFFERENTIAL/PLATELET
Abs Immature Granulocytes: 0.59 10*3/uL — ABNORMAL HIGH (ref 0.00–0.07)
Basophils Absolute: 0 10*3/uL (ref 0.0–0.1)
Basophils Relative: 0 %
Eosinophils Absolute: 0 10*3/uL (ref 0.0–0.5)
Eosinophils Relative: 0 %
HCT: 23.7 % — ABNORMAL LOW (ref 36.0–46.0)
Hemoglobin: 7.6 g/dL — ABNORMAL LOW (ref 12.0–15.0)
Immature Granulocytes: 7 %
Lymphocytes Relative: 26 %
Lymphs Abs: 2.1 10*3/uL (ref 0.7–4.0)
MCH: 36 pg — ABNORMAL HIGH (ref 26.0–34.0)
MCHC: 32.1 g/dL (ref 30.0–36.0)
MCV: 112.3 fL — ABNORMAL HIGH (ref 80.0–100.0)
Monocytes Absolute: 0.9 10*3/uL (ref 0.1–1.0)
Monocytes Relative: 11 %
Neutro Abs: 4.6 10*3/uL (ref 1.7–7.7)
Neutrophils Relative %: 56 %
Platelets: 158 10*3/uL (ref 150–400)
RBC: 2.11 MIL/uL — ABNORMAL LOW (ref 3.87–5.11)
RDW: 17.5 % — ABNORMAL HIGH (ref 11.5–15.5)
WBC: 8.2 10*3/uL (ref 4.0–10.5)
nRBC: 1.2 % — ABNORMAL HIGH (ref 0.0–0.2)

## 2018-06-12 LAB — COMPREHENSIVE METABOLIC PANEL
ALT: 14 U/L (ref 0–44)
AST: 17 U/L (ref 15–41)
Albumin: 4.2 g/dL (ref 3.5–5.0)
Alkaline Phosphatase: 99 U/L (ref 38–126)
Anion gap: 10 (ref 5–15)
BUN: 12 mg/dL (ref 6–20)
CO2: 21 mmol/L — ABNORMAL LOW (ref 22–32)
Calcium: 9 mg/dL (ref 8.9–10.3)
Chloride: 108 mmol/L (ref 98–111)
Creatinine, Ser: 0.84 mg/dL (ref 0.44–1.00)
GFR calc Af Amer: >60 mL/min (ref >60)
GFR calc non Af Amer: >60 mL/min (ref >60)
Glucose, Bld: 84 mg/dL (ref 70–99)
Potassium: 4.8 mmol/L (ref 3.5–5.1)
Sodium: 139 mmol/L (ref 135–145)
Total Bilirubin: <0.2 mg/dL — ABNORMAL LOW (ref 0.3–1.2)
Total Protein: 6.9 g/dL (ref 6.5–8.1)

## 2018-06-12 LAB — MAGNESIUM: Magnesium: 1.7 mg/dL (ref 1.7–2.4)

## 2018-06-12 MED ORDER — DARBEPOETIN ALFA 300 MCG/0.6ML IJ SOSY
PREFILLED_SYRINGE | INTRAMUSCULAR | Status: AC
  Filled 2018-06-12: qty 0.6

## 2018-06-12 MED ORDER — SODIUM CHLORIDE 0.9% FLUSH
10.0000 mL | INTRAVENOUS | Status: DC | PRN
  Administered 2018-06-12: 09:00:00 10 mL via INTRAVENOUS
  Filled 2018-06-12: qty 10

## 2018-06-12 MED ORDER — DARBEPOETIN ALFA 300 MCG/0.6ML IJ SOSY
300.0000 ug | PREFILLED_SYRINGE | Freq: Once | INTRAMUSCULAR | Status: AC
  Administered 2018-06-12: 11:00:00 300 ug via SUBCUTANEOUS

## 2018-06-12 NOTE — Patient Instructions (Signed)

## 2018-06-13 ENCOUNTER — Encounter: Payer: Self-pay | Admitting: *Deleted

## 2018-06-14 ENCOUNTER — Ambulatory Visit: Payer: 59

## 2018-06-14 ENCOUNTER — Encounter: Payer: Self-pay | Admitting: Oncology

## 2018-06-15 ENCOUNTER — Ambulatory Visit: Payer: 59

## 2018-06-16 ENCOUNTER — Telehealth: Payer: Self-pay | Admitting: Adult Health

## 2018-06-16 NOTE — Telephone Encounter (Signed)
I called [employer] and spoke with Ms. Terry.  She notified me of the status of CPT request [phone]. Approved from 06/16/2018 through 07/31/2018 [id number].  I repeated this number back to her and she verified it is correct.   Time spent: 3 minutes  Wilber Bihari, NP
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
## 2018-06-19 ENCOUNTER — Inpatient Hospital Stay: Payer: 59

## 2018-06-19 ENCOUNTER — Other Ambulatory Visit: Payer: Self-pay

## 2018-06-19 ENCOUNTER — Other Ambulatory Visit: Payer: 59

## 2018-06-19 ENCOUNTER — Inpatient Hospital Stay (HOSPITAL_BASED_OUTPATIENT_CLINIC_OR_DEPARTMENT_OTHER): Payer: 59 | Admitting: Oncology

## 2018-06-19 ENCOUNTER — Inpatient Hospital Stay: Payer: 59 | Attending: Oncology

## 2018-06-19 VITALS — BP 141/77 | HR 66 | Temp 97.6°F | Resp 18 | Ht 63.0 in | Wt 129.7 lb

## 2018-06-19 DIAGNOSIS — C5701 Malignant neoplasm of right fallopian tube: Secondary | ICD-10-CM | POA: Diagnosis not present

## 2018-06-19 DIAGNOSIS — D701 Agranulocytosis secondary to cancer chemotherapy: Secondary | ICD-10-CM | POA: Insufficient documentation

## 2018-06-19 DIAGNOSIS — Z5189 Encounter for other specified aftercare: Secondary | ICD-10-CM | POA: Diagnosis present

## 2018-06-19 DIAGNOSIS — C762 Malignant neoplasm of abdomen: Secondary | ICD-10-CM | POA: Diagnosis not present

## 2018-06-19 DIAGNOSIS — Z7951 Long term (current) use of inhaled steroids: Secondary | ICD-10-CM | POA: Diagnosis not present

## 2018-06-19 DIAGNOSIS — Z79899 Other long term (current) drug therapy: Secondary | ICD-10-CM

## 2018-06-19 DIAGNOSIS — G629 Polyneuropathy, unspecified: Secondary | ICD-10-CM | POA: Insufficient documentation

## 2018-06-19 DIAGNOSIS — C786 Secondary malignant neoplasm of retroperitoneum and peritoneum: Secondary | ICD-10-CM

## 2018-06-19 DIAGNOSIS — Z5111 Encounter for antineoplastic chemotherapy: Secondary | ICD-10-CM | POA: Diagnosis not present

## 2018-06-19 DIAGNOSIS — R188 Other ascites: Secondary | ICD-10-CM | POA: Insufficient documentation

## 2018-06-19 DIAGNOSIS — C801 Malignant (primary) neoplasm, unspecified: Principal | ICD-10-CM

## 2018-06-19 DIAGNOSIS — T451X5A Adverse effect of antineoplastic and immunosuppressive drugs, initial encounter: Secondary | ICD-10-CM

## 2018-06-19 LAB — CBC WITH DIFFERENTIAL/PLATELET
Abs Immature Granulocytes: 0.03 10*3/uL (ref 0.00–0.07)
Basophils Absolute: 0 10*3/uL (ref 0.0–0.1)
Basophils Relative: 1 %
Eosinophils Absolute: 0 10*3/uL (ref 0.0–0.5)
Eosinophils Relative: 0 %
HCT: 31.6 % — ABNORMAL LOW (ref 36.0–46.0)
Hemoglobin: 9.8 g/dL — ABNORMAL LOW (ref 12.0–15.0)
Immature Granulocytes: 1 %
Lymphocytes Relative: 22 %
Lymphs Abs: 1.3 10*3/uL (ref 0.7–4.0)
MCH: 35.8 pg — ABNORMAL HIGH (ref 26.0–34.0)
MCHC: 31 g/dL (ref 30.0–36.0)
MCV: 115.3 fL — ABNORMAL HIGH (ref 80.0–100.0)
Monocytes Absolute: 0.7 10*3/uL (ref 0.1–1.0)
Monocytes Relative: 11 %
Neutro Abs: 4 10*3/uL (ref 1.7–7.7)
Neutrophils Relative %: 65 %
Platelets: 363 10*3/uL (ref 150–400)
RBC: 2.74 MIL/uL — ABNORMAL LOW (ref 3.87–5.11)
RDW: 18.4 % — ABNORMAL HIGH (ref 11.5–15.5)
WBC: 6 10*3/uL (ref 4.0–10.5)
nRBC: 0 % (ref 0.0–0.2)

## 2018-06-19 LAB — CMP (CANCER CENTER ONLY)
ALT: 13 U/L (ref 0–44)
AST: 22 U/L (ref 15–41)
Albumin: 4.7 g/dL (ref 3.5–5.0)
Alkaline Phosphatase: 84 U/L (ref 38–126)
Anion gap: 11 (ref 5–15)
BUN: 14 mg/dL (ref 6–20)
CO2: 23 mmol/L (ref 22–32)
Calcium: 9.9 mg/dL (ref 8.9–10.3)
Chloride: 105 mmol/L (ref 98–111)
Creatinine: 1.06 mg/dL — ABNORMAL HIGH (ref 0.44–1.00)
GFR, Est AFR Am: >60 mL/min (ref >60)
GFR, Estimated: 59 mL/min — ABNORMAL LOW (ref >60)
Glucose, Bld: 90 mg/dL (ref 70–99)
Potassium: 5.1 mmol/L (ref 3.5–5.1)
Sodium: 139 mmol/L (ref 135–145)
Total Bilirubin: 0.2 mg/dL — ABNORMAL LOW (ref 0.3–1.2)
Total Protein: 8.2 g/dL — ABNORMAL HIGH (ref 6.5–8.1)

## 2018-06-20 ENCOUNTER — Telehealth: Payer: Self-pay | Admitting: Oncology

## 2018-06-20 LAB — CA 125: Cancer Antigen (CA) 125: 116 U/mL — ABNORMAL HIGH (ref 0.0–38.1)

## 2018-06-20 NOTE — Telephone Encounter (Signed)
Per 5/4 schedule message cancelled 5/8 lab/port. Confirmed with patient. Other appointments remain the same.

## 2018-06-20 NOTE — Telephone Encounter (Signed)
Called regarding schedule °

## 2018-06-21 ENCOUNTER — Other Ambulatory Visit: Payer: Self-pay

## 2018-06-21 ENCOUNTER — Encounter (HOSPITAL_COMMUNITY)
Admission: RE | Admit: 2018-06-21 | Discharge: 2018-06-21 | Disposition: A | Discharge status: Home / Self Care | Payer: 59 | Source: Ambulatory Visit | Attending: Oncology | Admitting: Oncology

## 2018-06-21 DIAGNOSIS — C801 Malignant (primary) neoplasm, unspecified: Secondary | ICD-10-CM | POA: Diagnosis present

## 2018-06-21 DIAGNOSIS — C762 Malignant neoplasm of abdomen: Secondary | ICD-10-CM | POA: Diagnosis present

## 2018-06-21 DIAGNOSIS — C786 Secondary malignant neoplasm of retroperitoneum and peritoneum: Secondary | ICD-10-CM | POA: Diagnosis present

## 2018-06-21 DIAGNOSIS — C5701 Malignant neoplasm of right fallopian tube: Secondary | ICD-10-CM | POA: Diagnosis present

## 2018-06-21 LAB — GLUCOSE, CAPILLARY: Glucose-Capillary: 89 mg/dL (ref 70–99)

## 2018-06-21 MED ORDER — FLUDEOXYGLUCOSE F - 18 (FDG) INJECTION: 6.4000 | Freq: Once | INTRAVENOUS | Status: DC | PRN

## 2018-06-22 NOTE — Progress Notes (Signed)
[facility]  [phone] [phone]    ID: Kathryn Ramsey DOB: 22-Jan-1966  MR#: [id number]  OFB#:[id number]  Patient Care Team: Harlan Stains, MD as PCP - General (Family Medicine) [name], MD as Consulting Physician (Obstetrics and Gynecology) Juanita Craver, MD as Consulting Physician (Gastroenterology) Servando Salina, MD as Consulting Physician (Obstetrics and Gynecology) Nickie Retort, MD as Consulting Physician (Urology) Gillis Ends, MD as Referring Physician (Obstetrics and Gynecology) OTHER MD: Festus Aloe [phone])   CHIEF COMPLAINT:  ovarian cancer  CURRENT TREATMENT: Cisplatin, gemcitabine; Aranesp; bevacizumab   INTERVAL HISTORY: Gene was seen today for follow-up and treatment of her recurrent ovarian cancer. She is accompanied by her husband via telephone.  She continues on cisplatin/gemcitabine given on days 1 and 8 of a 21 day cycle with granix given on days 3 and 4, and Onpro given on day 9. Today is day 1 cycle 6.  The start of this cycle was delayed so that she could be restaged. She continues to have some neuropathy in her hands and feet. The tingling in her feet comes and goes, but it is rather constant in her fingertips; however, she states that the tingling in her fingertips is very mild.  She also receives Aranesp every 3 weeks.  Her last dose was 06/12/2018.   She received Granix on 4/9.  Since her last visit here, she underwent a full body PET scan on 06/21/2018 showing: Overall improvement in peritoneal metastasis compared to PET-CT scan 01/24/2018. Reduced surface area of metabolic peritoneal metastasis along the RIGHT hepatic margin with only a small region remaining. Resolution of hypermetabolic peritoneal thickening along the inferior margin of the spleen. Marked improvement in hypermetabolic peritoneal thickening and peritoneal fluid within the pelvis. Single solitary new calcified  nodule in the deep peritoneal fat LEFT adjacent to the rectum.  Her CA125 appears to have plateaued.   Results for ADELEINE, PASK (MRN [id number]) as of 06/22/2018 17:10  Ref. Range 04/03/2018 09:00 05/01/2018 09:11 05/22/2018 09:52 05/29/2018 09:40 06/19/2018 09:31  Cancer Antigen (CA) 125 Latest Ref Range: 0.0 - 38.1 U/mL 608.0 (H) 140.0 (H) 101.0 (H) 113.0 (H) 116.0 (H)      REVIEW OF SYSTEMS: Karmela notes that she is hydrating more and moving around more. She also states that she feels stronger overall compared to how she felt at the last few visits. The patient denies unusual headaches, visual changes, nausea, vomiting, or dizziness. There has been no unusual cough, phlegm production, or pleurisy. This been no change in bowel or bladder habits. The patient denies unexplained fatigue or unexplained weight loss, bleeding, rash, or fever. A detailed review of systems was otherwise noncontributory.    HISTORY OF PRESENT ILLNESS: From Dr. Serita Grit original intake note 03/17/2015:  "Kathryn Ramsey is a very pleasant G4P4 who is seen in consultation at the request of Dr Collene Mares and Dr Garwin Brothers for peritoneal carcinomatosis. The patient has a history of a workup by urology for gross hematuria which included a pelvic examine was suggested for extrinsic mass effect on the bladder on cystoscopy. Cystoscopy took place on in December 2016. The patient denies abdominal pain bloating early satiety or abdominal distention.  On 08/03/2015 at [facility] she underwent a CT scan of the abdomen and pelvis as ordered by Dr Baruch Gouty. This revealed a 1.4 cm low attenuation lesion on the posterior right hepatic lobe suggestive of a hemangioma. Status post hysterectomy. No ovarian masses. Moderate ascites. Omental caking seen in the lateral left  abdomen and pelvis. Peritoneal nodularity in the left lateral pelvic cul-de-sac. No gross extrinsic compression on the bladder was identified on imaging.  The patient was then seen by  her gastroenterologist, Dr. Collene Mares, who performed a colonoscopy which was unremarkable.  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 She was treated adjuvantly with carboplatin and paclitaxel, as detailed below. Her CA 125 dropped from 1226 on 03/20/2015 to 26.1 by 06/16/2015.  Her subsequent history is as detailed above.   PAST MEDICAL HISTORY: Past Medical History:  Diagnosis Date   Acute sensory neuropathy 06/26/2015   Allergy    Anemia    Asthma    illness induced asthma   Blood transfusion without reported diagnosis    at age 22 years old D/T surgery to femur being crushed   Complication of anesthesia    hx. of allergic to ether (had surgery at 5 years and had reaction to the ether   Heart murmur    pt, states had a "working heart murmur"   History of kidney stones    Neutropenia, drug-induced ([place]) 03/10/2018   PONV (postoperative nausea and vomiting)    Skin cancer     PAST SURGICAL HISTORY: Past Surgical History:  Procedure Laterality Date   3 laporscopic proceedures     ABDOMINAL HYSTERECTOMY     2010   CHOLECYSTECTOMY     2010   DILATION AND CURETTAGE OF UTERUS     1997   IR IMAGING GUIDED PORT INSERTION  03/08/2018   LAPAROSCOPY N/A 03/27/2015   Procedure: DIAGNOSTIC LAPAROSCOPY ;  Surgeon: [name], MD;  Location: [facility];  Service: Gynecology;  Laterality: N/A;   LAPAROSCOPY N/A 02/24/2016   Procedure: LAPAROSCOPY DIAGNOSTIC WITH PERITONEAL WASHINGS AND PERITONEAL BIOPSY;  Surgeon: [name], MD;  Location: [facility];  Service: Gynecology;  Laterality: N/A;   LAPAROTOMY N/A 03/27/2015   Procedure: EXPLORATORY  LAPAROTOMY, BILATERAL SALPINGO OOPHORECTOMY, OMENTECTOMY, RADICAL TUMOR DEBULKING;  Surgeon: [name], MD;  Location: [facility];  Service: Gynecology;  Laterality: N/A;   sinus surgery 1994      FAMILY HISTORY Family History  Problem Relation Age of Onset   Hypertension Father    Skin cancer Father        nonmelanoma skin cancers in his late 44s   Non-Hodgkin's lymphoma Maternal Aunt        dx. 30s; smoker   Stroke Maternal Grandmother    Other Mother        benign meningioma dx. early-mid-70s; hysterectomy in her late 83s for heavy periods - still has ovaries   Other Son        one son with pre-cancerous skin findings   Other Daughter        cysts on ovaries and hx of heavy periods   Other Sister        hysterectomy for cysts - still has ovaries   Heart attack Maternal Grandfather    Heart attack Paternal Grandfather    Renal cancer Maternal Aunt 60       smoker   Breast cancer Maternal Aunt 78   Other Maternal Aunt        dx. benign brain tumor (meningioma) at 44; 2nd  benign brain tumor in her late 3s; hx of radical hysterectomy at age 59   Brain cancer Other        NOS tumor  The patient's parents are both living, in their late 74s as of January 2018. The patient has one brother, 3 sisters. There is no history of ovarian cancer in the family. One maternal aunt had breast and kidney cancer, another had non-Hodgkin's lymphoma.   GYNECOLOGIC HISTORY:  No LMP recorded. Patient has had a hysterectomy. 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 Her husband [name] is a Engineer, maintenance (IT). Their children are 72, 60, 41 and 13 y/o as of JAN 2018. The oldest works as an Electrical engineer in the [employer] in [place]. The other 3 children live in [place], the youngest currently attending [employer]. The patient has no grandchildren. She attends [employer]   ADVANCED  DIRECTIVES:    HEALTH MAINTENANCE: Social History   Tobacco Use   Smoking status: Never Smoker   Smokeless tobacco: Never Used  Substance Use Topics   Alcohol use: Yes    Comment:  3 glasses wine or beer/week   Drug use: No    Colonoscopy:2016  PAP: s/p hyst  Bone density:   Allergies  Allergen Reactions   Bee Venom Shortness Of Breath    swelling   Compazine [Prochlorperazine Edisylate] Anaphylaxis, Anxiety and Palpitations   Sulfa Antibiotics Shortness Of Breath    Vomit , diarrhea , hives   Zarxio [Filgrastim]     Current Outpatient Medications  Medication Sig Dispense Refill   dexamethasone (DECADRON) 4 MG tablet 2 TABS BY MOUTH ONCE A DAY ON THE DAY AFTER CISPLATIN CHEMO, THEN 2 TABS 2X/DAY FOR 2 DAYS. 30 tablet 0   diphenhydrAMINE (BENADRYL) 50 MG capsule 50 mg Diphenhydramine (Benadryl) PO within 1 hour of the injection 1 capsule 0   fluticasone (FLONASE) 50 MCG/ACT nasal spray Place 1 spray into both nostrils 2 (two) times daily. (Patient taking differently: Place 1 spray into both nostrils as needed. ) 16 g 2   gabapentin (NEURONTIN) 100 MG capsule Take 1 capsule (100 mg total) by mouth 3 (three) times daily. 270 capsule 3   lidocaine-prilocaine (EMLA) cream APPLY TOPICALLY AS NEEDED. 30 g 0   loratadine (CLARITIN) 10 MG tablet Take 10 mg by mouth as needed.      magnesium oxide (MAG-OX) 400 (241.3 Mg) MG tablet Take 1 tablet (400 mg total) by mouth daily. 60 tablet 3   omeprazole (PRILOSEC) 40 MG capsule Take 1 capsule (40 mg total) by mouth daily. 30 capsule 5   ondansetron (ZOFRAN) 8 MG tablet Take 1 tablet (8 mg total) by mouth 2 (two) times daily as needed. Start on the third day after cisplatin chemotherapy. 30 tablet 1   prochlorperazine (COMPAZINE) 10 MG tablet Take 1 tablet (10 mg total) by mouth every 6 (six) hours as needed (Nausea or vomiting). 30 tablet 1   sertraline (ZOLOFT) 50 MG tablet TAKE 1 TABLET ONCE DAILY. 30 tablet 2    traMADol (ULTRAM) 50 MG tablet Take 1 tablet (50 mg total) by mouth every 6 (six) hours as needed. 30 tablet 0   valACYclovir (VALTREX) 1000 MG tablet Take 1 tablet (1,000 mg total) by mouth daily. 5 tablet 0   No current facility-administered medications for this visit.    Facility-Administered Medications Ordered in Other Visits  Medication  Dose Route Frequency Provider Last Rate Last Dose   fludeoxyglucose F - 18 (FDG) injection 6.4 millicurie  6.4 millicurie Intravenous Once PRN Gus Height, MD        OBJECTIVE: Middle-aged white woman in no acute distress  Vitals:   06/23/18 0902  BP: 140/73  Pulse: (!) 59  Resp: 18  Temp: 98.3 F (36.8 C)  SpO2: 100%     Body mass index is 22.8 kg/m.   ECOG FS:1 - Symptomatic but completely ambulatory  Sclerae unicteric, pupils round and equal Wearing a mask No cervical or supraclavicular adenopathy Lungs no rales or rhonchi Heart regular rate and rhythm Abd soft, nontender, positive bowel sounds, no port site recurrence palpable MSK no focal spinal tenderness, no upper extremity lymphedema Neuro: nonfocal, well oriented, appropriate affect Breasts: Deferred     LAB RESULTS:  CMP     Component Value Date/Time   NA 139 06/19/2018 0931   NA 140 02/11/2017 0755   K 5.1 06/19/2018 0931   K 4.4 02/11/2017 0755   CL 105 06/19/2018 0931   CO2 23 06/19/2018 0931   CO2 26 02/11/2017 0755   GLUCOSE 90 06/19/2018 0931   GLUCOSE 93 02/11/2017 0755   BUN 14 06/19/2018 0931   BUN 7.9 02/11/2017 0755   CREATININE 1.06 (H) 06/19/2018 0931   CREATININE 0.7 02/11/2017 0755   CALCIUM 9.9 06/19/2018 0931   CALCIUM 9.6 02/11/2017 0755   PROT 8.2 (H) 06/19/2018 0931   PROT 7.5 02/11/2017 0755   ALBUMIN 4.7 06/19/2018 0931   ALBUMIN 4.4 02/11/2017 0755   AST 22 06/19/2018 0931   AST 27 02/11/2017 0755   ALT 13 06/19/2018 0931   ALT 24 02/11/2017 0755   ALKPHOS 84 06/19/2018 0931   ALKPHOS 58 02/11/2017 0755   BILITOT 0.2 (L)  06/19/2018 0931   BILITOT 0.43 02/11/2017 0755   GFRNONAA 59 (L) 06/19/2018 0931   GFRAA >60 06/19/2018 0931    INo results found for: SPEP, UPEP  Lab Results  Component Value Date   WBC 6.0 06/19/2018   NEUTROABS 4.0 06/19/2018   HGB 9.8 (L) 06/19/2018   HCT 31.6 (L) 06/19/2018   MCV 115.3 (H) 06/19/2018   PLT 363 06/19/2018      Chemistry      Component Value Date/Time   NA 139 06/19/2018 0931   NA 140 02/11/2017 0755   K 5.1 06/19/2018 0931   K 4.4 02/11/2017 0755   CL 105 06/19/2018 0931   CO2 23 06/19/2018 0931   CO2 26 02/11/2017 0755   BUN 14 06/19/2018 0931   BUN 7.9 02/11/2017 0755   CREATININE 1.06 (H) 06/19/2018 0931   CREATININE 0.7 02/11/2017 0755      Component Value Date/Time   CALCIUM 9.9 06/19/2018 0931   CALCIUM 9.6 02/11/2017 0755   ALKPHOS 84 06/19/2018 0931   ALKPHOS 58 02/11/2017 0755   AST 22 06/19/2018 0931   AST 27 02/11/2017 0755   ALT 13 06/19/2018 0931   ALT 24 02/11/2017 0755   BILITOT 0.2 (L) 06/19/2018 0931   BILITOT 0.43 02/11/2017 0755       No results found for: LABCA2  No components found for: LABCA125  No results for input(s): INR in the last 168 hours.  Urinalysis    Component Value Date/Time   COLORURINE STRAW (A) 11/22/2017 0940   APPEARANCEUR CLEAR 11/22/2017 0940   LABSPEC 1.002 (L) 11/22/2017 0940   LABSPEC 1.005 10/09/2015 1239   PHURINE 7.0 11/22/2017 0940  GLUCOSEU NEGATIVE 11/22/2017 0940   GLUCOSEU Negative 10/09/2015 1239   HGBUR NEGATIVE 11/22/2017 0940   BILIRUBINUR NEGATIVE 11/22/2017 0940   BILIRUBINUR Negative 10/09/2015 1239   KETONESUR NEGATIVE 11/22/2017 0940   PROTEINUR NEGATIVE 11/22/2017 0940   UROBILINOGEN 0.2 10/09/2015 1239   NITRITE NEGATIVE 11/22/2017 0940   LEUKOCYTESUR NEGATIVE 11/22/2017 0940   LEUKOCYTESUR Negative 10/09/2015 1239    STUDIES: Nm Pet Image Restag (ps) Skull Base To Thigh  Result Date: 06/21/2018 CLINICAL DATA:  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 Ovarian cancer with recurrence. Ongoing chemotherapy. EXAM: NUCLEAR MEDICINE PET SKULL BASE TO THIGH TECHNIQUE: 6.4 mCi F-18 FDG was injected intravenously. Full-ring PET imaging was performed from the skull base to thigh after the radiotracer. CT data was obtained and used for attenuation correction and anatomic localization. Fasting blood glucose: 89 mg/dl COMPARISON:  PET-CT 01/24/2018, MRI 02/03/2018 FINDINGS: Mediastinal blood pool activity: SUV max 2.4 NECK: No hypermetabolic lymph nodes in the neck. Incidental CT findings: none CHEST: No hypermetabolic mediastinal or hilar nodes. No suspicious pulmonary nodules on the CT scan. Incidental CT findings: none ABDOMEN/PELVIS: subtle rim of metabolic activity along the RIGHT hepatic lobe has decreased in surface area compared to PET-CT 01/24/2018. One subtle focus of activity does remain along the posterior RIGHT aspect of the hepatic capsule with SUV max equal 4.5. Similar to metabolic activity along the anterior hepatic capsule on comparison exam (SUV max equal 4.2). Interval resolution of the peritoneal metabolic active thickening/fluid inferior to the spleen. Within the pelvis, the hypermetabolic fluid / peritoneal thickening in the deep pelvis has for the most part resolved. Minimal residual fluid. No significant residual activity. One focus of hypermetabolic activity is present in the deep LEFT perirectal fat with SUV max equal 5.7 on image 169. There is a small calcified nodule at this level measuring 10 mm on image 169/4. This calcified nodule appears new from prior. No new peritoneal disease elsewhere in the abdomen. No hypermetabolic abdominopelvic lymph nodes. No solid organ involvement. Incidental CT findings: Post hysterectomy and oophorectomy. SKELETON: No focal hypermetabolic activity to suggest skeletal metastasis. Incidental CT findings: none IMPRESSION: 1. Overall improvement in peritoneal metastasis compared to PET-CT scan 01/24/2018.  2. Reduced surface area of metabolic peritoneal metastasis along the RIGHT hepatic margin with only a small region remaining. 3. Resolution of hypermetabolic peritoneal thickening along the inferior margin of the spleen. 4. Marked improvement in hypermetabolic peritoneal thickening and peritoneal fluid within the pelvis. Single solitary new calcified nodule in the deep peritoneal fat LEFT adjacent to the rectum. Electronically Signed   By: Suzy Bouchard M.D.   On: 06/21/2018 09:39    ELIGIBLE FOR AVAILABLE RESEARCH PROTOCOL:  discussing protocols at [facility] [place]: 53 y.o. BRCA negative [place] woman status post radical tumor debulking with optimal cytoreduction (R0) 03/27/2015 for a stage IIIC, high-grade right fallopian tube carcinoma  (a) baseline CA-125 was 1226.  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)  (c) tumor is PD-L1 negative (02/24/2016)  (d) tumor is strongly estrogen receptor positive, progesterone receptor negative (02/24/2016)  (1) adjuvant chemotherapy consisted of carboplatin and paclitaxel for 6 doses, begun 04/14/2015, completed 08/11/2015  (a) paclitaxel was omitted from cycle 5 and dose reduced on cycle 6 because of neuropathy   (b) a "make up" dose of paclitaxel was given 08/25/2015  (c) last carboplatin  dose was 08/11/2015  (d) CA-125 normalized by 06/16/2015   (2) FIRST RECURRENCE: January 2018  (a) CA-125 rise beginning November 2017 led to CT scans and PET scans December 2017, all negative  (b) exploratory laparotomy 02/24/2016 showed pathologically confirmed recurrence, with miliary disease involving all examined surfaces  (c) port-site metastases noted 03/08/2016  (d) the January 2018 tumor sample was tested for the estrogen receptor and he was 80 percent positive with  strong staining, progesterone receptor negative  (3) carboplatin/liposomal doxorubicin started 03/05/2016, repeated every three weeks x4, completed 06/04/2016.  (a) cycle 3 delayed one week because of neutropenia; OnPro added  (b) CA 125 normalized after cycle 3  (4) RUCAPARIB maintenance started 07/02/2016  (a) restaging 10/01/2016: Normal CA 125, negative CT of the abdomen and pelvis  (b) labs 11/24/2016 shows a rise in her CA 125-43.4, with continuing rise thereafter  (c) rucaparib discontinued October 2018 with rising CA 125  (5) anastrozole started December 23, 2016-stopped after a couple of weeks with poor tolerance  SECOND RECURRENCE: (6) Gemcitabine/Bevacizumab started on 02/04/2017  (a) gemcitabine omitted cycle 4 because of intercurrent infection  (b) gemcitabine resumed with cycle 5, with intervening rise in  Ca 125  (c) repeat CT scan of the abdomen and pelvis on 05/26/2017 does not confirm obvious disease progression  (d) cycle 5 of gemcitabine/bevacizumab delayed 1 week   (e) gemcitabine/ bevacizumab discontinued after 6 cycles, with rising CA 125 (last dose 06/24/2017)  (7) foundation one testing did not show a high mutation burden and the microsatellite status was stable.  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  These were not immediately actionable  (8) Abraxane started 07/12/2017, given weekly or weeks 1 and 2 of each 3 weeks cycle, last dose 11/15/2017 (5 cycles)  (a) cycle 2 started 08/09/2017, will be day 1 day 8 only  (b) cycle 3 scheduled to start 09/13/2017, day 1 day 8 at patient request  (c) CT of the abdomen and pelvis 11/28/2017 shows no measurable disease  (d) PET scan 01/25/2018 and MRI of the pelvis 02/03/2018 show no measurable disease  (e) patient has symptomatic ascites and a rapidly rising Ca1 25  (9) started cisplatin/gemcitabine days 1 and 8 of each 21-day cycle on 02/18/2018   PLAN: We discussed the trend in her Ca1 25 and the results  of the recent staging scan.  She is clearly responding to treatment.  There is one possibly new area, which may or may not be disease, but in general the tumor burden is significantly decreased.  There has not been complete resolution and we are continuing therapy, with a dose today, and then the day 8 treatment actually more like day 11 will be on 07/03/2018.  She will continue to receive Granix days 5 and 6 and then on pro with the day "8" treatment  She is having tingling in her fingers, minimally so in her feet.  Of course this could be carpal tunnel as well but cisplatin is known to cause neuropathy.  She is also having a little bit of tinnitus.  I wonder if we should switch to carboplatin at this point.  I will discuss that with Dr. Denman George and also whether she thinks adding bevacizumab at this point would be helpful or whether we should wait and just use that for maintenance  We will repeat lab work 07/13/2018 and she will see me again for the beginning of cycle 7 07/17/2018  She knows to call for any  other issue that may develop before then.  Fardeen Steinberger, Virgie Dad, MD  06/23/18 9:29 AM Medical Oncology and Hematology [facility] [address] [place], [place] [address] Tel. [phone]    Fax. [phone]   I, Jacqualyn Posey am acting as a Education administrator for Chauncey Cruel, MD.   I, Lurline Del MD, have reviewed the above documentation for accuracy and completeness, and I agree with the above.

## 2018-06-23 ENCOUNTER — Other Ambulatory Visit: Payer: 59

## 2018-06-23 ENCOUNTER — Inpatient Hospital Stay: Payer: 59

## 2018-06-23 ENCOUNTER — Inpatient Hospital Stay (HOSPITAL_BASED_OUTPATIENT_CLINIC_OR_DEPARTMENT_OTHER): Payer: 59 | Admitting: Oncology

## 2018-06-23 ENCOUNTER — Other Ambulatory Visit: Payer: Self-pay

## 2018-06-23 VITALS — BP 140/73 | HR 59 | Temp 98.3°F | Resp 18 | Ht 63.0 in | Wt 128.7 lb

## 2018-06-23 DIAGNOSIS — C762 Malignant neoplasm of abdomen: Secondary | ICD-10-CM

## 2018-06-23 DIAGNOSIS — D701 Agranulocytosis secondary to cancer chemotherapy: Secondary | ICD-10-CM

## 2018-06-23 DIAGNOSIS — Z7951 Long term (current) use of inhaled steroids: Secondary | ICD-10-CM

## 2018-06-23 DIAGNOSIS — G629 Polyneuropathy, unspecified: Secondary | ICD-10-CM

## 2018-06-23 DIAGNOSIS — C786 Secondary malignant neoplasm of retroperitoneum and peritoneum: Secondary | ICD-10-CM | POA: Diagnosis not present

## 2018-06-23 DIAGNOSIS — C5701 Malignant neoplasm of right fallopian tube: Secondary | ICD-10-CM

## 2018-06-23 DIAGNOSIS — Z7189 Other specified counseling: Secondary | ICD-10-CM

## 2018-06-23 DIAGNOSIS — Z79899 Other long term (current) drug therapy: Secondary | ICD-10-CM

## 2018-06-23 DIAGNOSIS — Z5111 Encounter for antineoplastic chemotherapy: Secondary | ICD-10-CM | POA: Diagnosis not present

## 2018-06-23 DIAGNOSIS — R188 Other ascites: Secondary | ICD-10-CM

## 2018-06-23 MED ORDER — SODIUM CHLORIDE 0.9 % IV SOLN
800.0000 mg/m2 | Freq: Once | INTRAVENOUS | Status: AC
  Administered 2018-06-23: 1254 mg via INTRAVENOUS
  Filled 2018-06-23: qty 32.98

## 2018-06-23 MED ORDER — SODIUM CHLORIDE 0.9 % IV SOLN
40.0000 mg/m2 | Freq: Once | INTRAVENOUS | Status: AC
  Administered 2018-06-23: 63 mg via INTRAVENOUS
  Filled 2018-06-23: qty 63

## 2018-06-23 MED ORDER — SODIUM CHLORIDE 0.9 % IV SOLN
Freq: Once | INTRAVENOUS | Status: AC
  Administered 2018-06-23: 12:00:00 via INTRAVENOUS
  Filled 2018-06-23: qty 5

## 2018-06-23 MED ORDER — SODIUM CHLORIDE 0.9% FLUSH
10.0000 mL | INTRAVENOUS | Status: DC | PRN
  Administered 2018-06-23: 10 mL
  Filled 2018-06-23: qty 10

## 2018-06-23 MED ORDER — POTASSIUM CHLORIDE 2 MEQ/ML IV SOLN
Freq: Once | INTRAVENOUS | Status: AC
  Administered 2018-06-23: 10:00:00 via INTRAVENOUS
  Filled 2018-06-23: qty 10

## 2018-06-23 MED ORDER — LIDOCAINE-PRILOCAINE 2.5-2.5 % EX CREA
TOPICAL_CREAM | CUTANEOUS | Status: AC
  Filled 2018-06-23: qty 5

## 2018-06-23 MED ORDER — PALONOSETRON HCL INJECTION 0.25 MG/5ML
0.2500 mg | Freq: Once | INTRAVENOUS | Status: AC
  Administered 2018-06-23: 0.25 mg via INTRAVENOUS

## 2018-06-23 MED ORDER — HEPARIN SOD (PORK) LOCK FLUSH 100 UNIT/ML IV SOLN
500.0000 [IU] | Freq: Once | INTRAVENOUS | Status: AC | PRN
  Administered 2018-06-23: 500 [IU]
  Filled 2018-06-23: qty 5

## 2018-06-23 MED ORDER — PALONOSETRON HCL INJECTION 0.25 MG/5ML
INTRAVENOUS | Status: AC
  Filled 2018-06-23: qty 5

## 2018-06-23 MED ORDER — SODIUM CHLORIDE 0.9 % IV SOLN
Freq: Once | INTRAVENOUS | Status: AC
  Administered 2018-06-23: 10:00:00 via INTRAVENOUS
  Filled 2018-06-23: qty 250

## 2018-06-23 NOTE — Patient Instructions (Signed)
[facility] Discharge Instructions for Patients Receiving Chemotherapy  Removed Onpro after 8:00pm on Tuesday, 04/11/18.  Today you received the following chemotherapy agents:  Gemzar and Cisplatin.  To help prevent nausea and vomiting after your treatment, we encourage you to take your nausea medication as directed.   If you develop nausea and vomiting that is not controlled by your nausea medication, call the clinic.   BELOW ARE SYMPTOMS THAT SHOULD BE REPORTED IMMEDIATELY:  *FEVER GREATER THAN 100.5 F  *CHILLS WITH OR WITHOUT FEVER  NAUSEA AND VOMITING THAT IS NOT CONTROLLED WITH YOUR NAUSEA MEDICATION  *UNUSUAL SHORTNESS OF BREATH  *UNUSUAL BRUISING OR BLEEDING  TENDERNESS IN MOUTH AND THROAT WITH OR WITHOUT PRESENCE OF ULCERS  *URINARY PROBLEMS  *BOWEL PROBLEMS  UNUSUAL RASH Items with * indicate a potential emergency and should be followed up as soon as possible.  Feel free to call the clinic should you have any questions or concerns. The clinic phone number is [phone].  Please show the [facility] at check-in to the Emergency Department and triage nurse.

## 2018-06-25 ENCOUNTER — Other Ambulatory Visit: Payer: Self-pay | Admitting: Oncology

## 2018-06-26 ENCOUNTER — Other Ambulatory Visit: Payer: Self-pay

## 2018-06-26 ENCOUNTER — Other Ambulatory Visit: Payer: Self-pay | Admitting: Oncology

## 2018-06-26 ENCOUNTER — Inpatient Hospital Stay: Payer: 59

## 2018-06-26 VITALS — BP 138/75 | HR 60 | Temp 98.0°F | Resp 18

## 2018-06-26 DIAGNOSIS — T451X5A Adverse effect of antineoplastic and immunosuppressive drugs, initial encounter: Secondary | ICD-10-CM

## 2018-06-26 DIAGNOSIS — C762 Malignant neoplasm of abdomen: Secondary | ICD-10-CM

## 2018-06-26 DIAGNOSIS — D702 Other drug-induced agranulocytosis: Secondary | ICD-10-CM

## 2018-06-26 DIAGNOSIS — Z95828 Presence of other vascular implants and grafts: Secondary | ICD-10-CM

## 2018-06-26 DIAGNOSIS — Z5111 Encounter for antineoplastic chemotherapy: Secondary | ICD-10-CM | POA: Diagnosis not present

## 2018-06-26 DIAGNOSIS — D701 Agranulocytosis secondary to cancer chemotherapy: Secondary | ICD-10-CM

## 2018-06-26 MED ORDER — TBO-FILGRASTIM 480 MCG/0.8ML ~~LOC~~ SOSY
480.0000 ug | PREFILLED_SYRINGE | Freq: Once | SUBCUTANEOUS | Status: AC
  Administered 2018-06-26: 480 ug via SUBCUTANEOUS

## 2018-06-26 MED ORDER — TBO-FILGRASTIM 480 MCG/0.8ML ~~LOC~~ SOSY
PREFILLED_SYRINGE | SUBCUTANEOUS | Status: AC
  Filled 2018-06-26: qty 0.8

## 2018-06-26 NOTE — Progress Notes (Unsigned)
I called Kathryn Ramsey and let her know that were going to be switching from cisplatin to carboplatin.  With this final treatment that she gets next week she will receive the carboplatin AUC of 2 but with the subsequent cycles she will have an AUC of 5 on day 1 and no carbo on day 8, repeat every 21 days.  She will receive OnPro with a day 8 treatment.

## 2018-06-27 ENCOUNTER — Other Ambulatory Visit: Payer: Self-pay

## 2018-06-27 ENCOUNTER — Inpatient Hospital Stay: Payer: 59

## 2018-06-27 DIAGNOSIS — Z95828 Presence of other vascular implants and grafts: Secondary | ICD-10-CM

## 2018-06-27 DIAGNOSIS — T451X5A Adverse effect of antineoplastic and immunosuppressive drugs, initial encounter: Secondary | ICD-10-CM

## 2018-06-27 DIAGNOSIS — Z5111 Encounter for antineoplastic chemotherapy: Secondary | ICD-10-CM | POA: Diagnosis not present

## 2018-06-27 DIAGNOSIS — C762 Malignant neoplasm of abdomen: Secondary | ICD-10-CM

## 2018-06-27 DIAGNOSIS — D701 Agranulocytosis secondary to cancer chemotherapy: Secondary | ICD-10-CM

## 2018-06-27 DIAGNOSIS — D702 Other drug-induced agranulocytosis: Secondary | ICD-10-CM

## 2018-06-27 MED ORDER — TBO-FILGRASTIM 480 MCG/0.8ML ~~LOC~~ SOSY
480.0000 ug | PREFILLED_SYRINGE | Freq: Once | SUBCUTANEOUS | Status: AC
  Administered 2018-06-27: 480 ug via SUBCUTANEOUS

## 2018-06-27 MED ORDER — TBO-FILGRASTIM 480 MCG/0.8ML ~~LOC~~ SOSY
PREFILLED_SYRINGE | SUBCUTANEOUS | Status: AC
  Filled 2018-06-27: qty 0.8

## 2018-07-03 ENCOUNTER — Inpatient Hospital Stay: Payer: 59

## 2018-07-03 ENCOUNTER — Other Ambulatory Visit: Payer: Self-pay | Admitting: Oncology

## 2018-07-03 ENCOUNTER — Other Ambulatory Visit: Payer: Self-pay

## 2018-07-03 ENCOUNTER — Telehealth: Payer: Self-pay | Admitting: *Deleted

## 2018-07-03 ENCOUNTER — Ambulatory Visit: Payer: 59 | Admitting: Oncology

## 2018-07-03 VITALS — BP 123/84 | HR 63 | Temp 98.3°F | Resp 16 | Ht 63.0 in | Wt 127.2 lb

## 2018-07-03 DIAGNOSIS — D702 Other drug-induced agranulocytosis: Secondary | ICD-10-CM

## 2018-07-03 DIAGNOSIS — Z95828 Presence of other vascular implants and grafts: Secondary | ICD-10-CM

## 2018-07-03 DIAGNOSIS — D701 Agranulocytosis secondary to cancer chemotherapy: Secondary | ICD-10-CM

## 2018-07-03 DIAGNOSIS — C762 Malignant neoplasm of abdomen: Secondary | ICD-10-CM

## 2018-07-03 DIAGNOSIS — G620 Drug-induced polyneuropathy: Secondary | ICD-10-CM

## 2018-07-03 DIAGNOSIS — C5701 Malignant neoplasm of right fallopian tube: Secondary | ICD-10-CM

## 2018-07-03 DIAGNOSIS — Z7189 Other specified counseling: Secondary | ICD-10-CM

## 2018-07-03 DIAGNOSIS — C786 Secondary malignant neoplasm of retroperitoneum and peritoneum: Secondary | ICD-10-CM

## 2018-07-03 DIAGNOSIS — Z5111 Encounter for antineoplastic chemotherapy: Secondary | ICD-10-CM | POA: Diagnosis not present

## 2018-07-03 LAB — COMPREHENSIVE METABOLIC PANEL
ALT: 21 U/L (ref 0–44)
AST: 20 U/L (ref 15–41)
Albumin: 4.2 g/dL (ref 3.5–5.0)
Alkaline Phosphatase: 63 U/L (ref 38–126)
Anion gap: 7 (ref 5–15)
BUN: 16 mg/dL (ref 6–20)
CO2: 24 mmol/L (ref 22–32)
Calcium: 9.2 mg/dL (ref 8.9–10.3)
Chloride: 106 mmol/L (ref 98–111)
Creatinine, Ser: 0.85 mg/dL (ref 0.44–1.00)
GFR calc Af Amer: >60 mL/min (ref >60)
GFR calc non Af Amer: >60 mL/min (ref >60)
Glucose, Bld: 85 mg/dL (ref 70–99)
Potassium: 4.4 mmol/L (ref 3.5–5.1)
Sodium: 137 mmol/L (ref 135–145)
Total Bilirubin: 0.2 mg/dL — ABNORMAL LOW (ref 0.3–1.2)
Total Protein: 7.1 g/dL (ref 6.5–8.1)

## 2018-07-03 LAB — CBC WITH DIFFERENTIAL/PLATELET
Abs Immature Granulocytes: 0.01 10*3/uL (ref 0.00–0.07)
Basophils Absolute: 0 10*3/uL (ref 0.0–0.1)
Basophils Relative: 0 %
Eosinophils Absolute: 0 10*3/uL (ref 0.0–0.5)
Eosinophils Relative: 0 %
HCT: 30.7 % — ABNORMAL LOW (ref 36.0–46.0)
Hemoglobin: 9.8 g/dL — ABNORMAL LOW (ref 12.0–15.0)
Immature Granulocytes: 0 %
Lymphocytes Relative: 42 %
Lymphs Abs: 1.4 10*3/uL (ref 0.7–4.0)
MCH: 35.5 pg — ABNORMAL HIGH (ref 26.0–34.0)
MCHC: 31.9 g/dL (ref 30.0–36.0)
MCV: 111.2 fL — ABNORMAL HIGH (ref 80.0–100.0)
Monocytes Absolute: 0.4 10*3/uL (ref 0.1–1.0)
Monocytes Relative: 12 %
Neutro Abs: 1.5 10*3/uL — ABNORMAL LOW (ref 1.7–7.7)
Neutrophils Relative %: 46 %
Platelets: 128 10*3/uL — ABNORMAL LOW (ref 150–400)
RBC: 2.76 MIL/uL — ABNORMAL LOW (ref 3.87–5.11)
RDW: 13.8 % (ref 11.5–15.5)
WBC: 3.4 10*3/uL — ABNORMAL LOW (ref 4.0–10.5)
nRBC: 0 % (ref 0.0–0.2)

## 2018-07-03 MED ORDER — SODIUM CHLORIDE 0.9% FLUSH
10.0000 mL | INTRAVENOUS | Status: DC | PRN
  Administered 2018-07-03: 10 mL
  Filled 2018-07-03: qty 10

## 2018-07-03 MED ORDER — SODIUM CHLORIDE 0.9 % IV SOLN
800.0000 mg/m2 | Freq: Once | INTRAVENOUS | Status: AC
  Administered 2018-07-03: 1254 mg via INTRAVENOUS
  Filled 2018-07-03: qty 32.98

## 2018-07-03 MED ORDER — DARBEPOETIN ALFA 300 MCG/0.6ML IJ SOSY
PREFILLED_SYRINGE | INTRAMUSCULAR | Status: AC
  Filled 2018-07-03: qty 0.6

## 2018-07-03 MED ORDER — PALONOSETRON HCL INJECTION 0.25 MG/5ML
0.2500 mg | Freq: Once | INTRAVENOUS | Status: AC
  Administered 2018-07-03: 0.25 mg via INTRAVENOUS

## 2018-07-03 MED ORDER — SODIUM CHLORIDE 0.9 % IV SOLN
Freq: Once | INTRAVENOUS | Status: AC
  Administered 2018-07-03: 11:00:00 via INTRAVENOUS
  Filled 2018-07-03: qty 250

## 2018-07-03 MED ORDER — PEGFILGRASTIM 6 MG/0.6ML ~~LOC~~ PSKT
PREFILLED_SYRINGE | SUBCUTANEOUS | Status: AC
  Filled 2018-07-03: qty 0.6

## 2018-07-03 MED ORDER — HEPARIN SOD (PORK) LOCK FLUSH 100 UNIT/ML IV SOLN
500.0000 [IU] | Freq: Once | INTRAVENOUS | Status: AC | PRN
  Administered 2018-07-03: 500 [IU]
  Filled 2018-07-03: qty 5

## 2018-07-03 MED ORDER — PALONOSETRON HCL INJECTION 0.25 MG/5ML
INTRAVENOUS | Status: AC
  Filled 2018-07-03: qty 5

## 2018-07-03 MED ORDER — DARBEPOETIN ALFA 300 MCG/0.6ML IJ SOSY
300.0000 ug | PREFILLED_SYRINGE | Freq: Once | INTRAMUSCULAR | Status: AC
  Administered 2018-07-03: 300 ug via SUBCUTANEOUS

## 2018-07-03 MED ORDER — SODIUM CHLORIDE 0.9 % IV SOLN
Freq: Once | INTRAVENOUS | Status: AC
  Administered 2018-07-03: 11:00:00 via INTRAVENOUS
  Filled 2018-07-03: qty 5

## 2018-07-03 MED ORDER — PEGFILGRASTIM 6 MG/0.6ML ~~LOC~~ PSKT
6.0000 mg | PREFILLED_SYRINGE | Freq: Once | SUBCUTANEOUS | Status: AC
  Administered 2018-07-03: 14:00:00 6 mg via SUBCUTANEOUS

## 2018-07-03 MED ORDER — SODIUM CHLORIDE 0.9 % IV SOLN
183.4000 mg | Freq: Once | INTRAVENOUS | Status: AC
  Administered 2018-07-03: 180 mg via INTRAVENOUS
  Filled 2018-07-03: qty 18

## 2018-07-03 MED ORDER — SODIUM CHLORIDE 0.9% FLUSH
10.0000 mL | INTRAVENOUS | Status: DC | PRN
  Administered 2018-07-03: 10 mL via INTRAVENOUS
  Filled 2018-07-03: qty 10

## 2018-07-03 NOTE — Telephone Encounter (Signed)
Per MD - chemo treatment plan changed from cisplatin to [place]- pt does not need associated fluid orders on cisplatin treatment plan for carboplatin. May d/c.

## 2018-07-03 NOTE — Patient Instructions (Signed)
[facility] Center Discharge Instructions for Patients Receiving Chemotherapy  Today you received the following chemotherapy agents:  Gemzar, Carboplatin  To help prevent nausea and vomiting after your treatment, we encourage you to take your nausea medication as prescribed.   If you develop nausea and vomiting that is not controlled by your nausea medication, call the clinic.   BELOW ARE SYMPTOMS THAT SHOULD BE REPORTED IMMEDIATELY:  *FEVER GREATER THAN 100.5 F  *CHILLS WITH OR WITHOUT FEVER  NAUSEA AND VOMITING THAT IS NOT CONTROLLED WITH YOUR NAUSEA MEDICATION  *UNUSUAL SHORTNESS OF BREATH  *UNUSUAL BRUISING OR BLEEDING  TENDERNESS IN MOUTH AND THROAT WITH OR WITHOUT PRESENCE OF ULCERS  *URINARY PROBLEMS  *BOWEL PROBLEMS  UNUSUAL RASH Items with * indicate a potential emergency and should be followed up as soon as possible.  Feel free to call the clinic should you have any questions or concerns. The clinic phone number is [phone].  Please show the CHEMO ALERT CARD at check-in to the Emergency Department and triage nurse.   

## 2018-07-04 LAB — CA 125: Cancer Antigen (CA) 125: 116 U/mL — ABNORMAL HIGH (ref 0.0–38.1)

## 2018-07-05 ENCOUNTER — Ambulatory Visit: Payer: 59

## 2018-07-06 ENCOUNTER — Ambulatory Visit: Payer: 59

## 2018-07-08 ENCOUNTER — Other Ambulatory Visit: Payer: Self-pay | Admitting: Oncology

## 2018-07-11 ENCOUNTER — Ambulatory Visit: Payer: 59

## 2018-07-11 ENCOUNTER — Other Ambulatory Visit: Payer: 59

## 2018-07-12 ENCOUNTER — Other Ambulatory Visit: Payer: Self-pay | Admitting: *Deleted

## 2018-07-12 DIAGNOSIS — C5701 Malignant neoplasm of right fallopian tube: Secondary | ICD-10-CM

## 2018-07-12 DIAGNOSIS — C762 Malignant neoplasm of abdomen: Secondary | ICD-10-CM

## 2018-07-13 ENCOUNTER — Inpatient Hospital Stay: Payer: 59

## 2018-07-13 ENCOUNTER — Other Ambulatory Visit: Payer: Self-pay

## 2018-07-13 ENCOUNTER — Other Ambulatory Visit: Payer: Self-pay | Admitting: Oncology

## 2018-07-13 DIAGNOSIS — Z5111 Encounter for antineoplastic chemotherapy: Secondary | ICD-10-CM | POA: Diagnosis not present

## 2018-07-13 DIAGNOSIS — C762 Malignant neoplasm of abdomen: Secondary | ICD-10-CM

## 2018-07-13 DIAGNOSIS — C5701 Malignant neoplasm of right fallopian tube: Secondary | ICD-10-CM

## 2018-07-13 LAB — CBC WITH DIFFERENTIAL/PLATELET
Abs Immature Granulocytes: 0.03 10*3/uL (ref 0.00–0.07)
Basophils Absolute: 0 10*3/uL (ref 0.0–0.1)
Basophils Relative: 0 %
Eosinophils Absolute: 0 10*3/uL (ref 0.0–0.5)
Eosinophils Relative: 0 %
HCT: 31 % — ABNORMAL LOW (ref 36.0–46.0)
Hemoglobin: 9.7 g/dL — ABNORMAL LOW (ref 12.0–15.0)
Immature Granulocytes: 1 %
Lymphocytes Relative: 28 %
Lymphs Abs: 1.5 10*3/uL (ref 0.7–4.0)
MCH: 34.9 pg — ABNORMAL HIGH (ref 26.0–34.0)
MCHC: 31.3 g/dL (ref 30.0–36.0)
MCV: 111.5 fL — ABNORMAL HIGH (ref 80.0–100.0)
Monocytes Absolute: 0.4 10*3/uL (ref 0.1–1.0)
Monocytes Relative: 7 %
Neutro Abs: 3.4 10*3/uL (ref 1.7–7.7)
Neutrophils Relative %: 64 %
Platelets: 62 10*3/uL — ABNORMAL LOW (ref 150–400)
RBC: 2.78 MIL/uL — ABNORMAL LOW (ref 3.87–5.11)
RDW: 13.7 % (ref 11.5–15.5)
WBC: 5.3 10*3/uL (ref 4.0–10.5)
nRBC: 0 % (ref 0.0–0.2)

## 2018-07-13 LAB — COMPREHENSIVE METABOLIC PANEL
ALT: 25 U/L (ref 0–44)
AST: 23 U/L (ref 15–41)
Albumin: 4.5 g/dL (ref 3.5–5.0)
Alkaline Phosphatase: 111 U/L (ref 38–126)
Anion gap: 9 (ref 5–15)
BUN: 17 mg/dL (ref 6–20)
CO2: 24 mmol/L (ref 22–32)
Calcium: 9.7 mg/dL (ref 8.9–10.3)
Chloride: 106 mmol/L (ref 98–111)
Creatinine, Ser: 1.07 mg/dL — ABNORMAL HIGH (ref 0.44–1.00)
GFR calc Af Amer: >60 mL/min (ref >60)
GFR calc non Af Amer: 58 mL/min — ABNORMAL LOW (ref >60)
Glucose, Bld: 96 mg/dL (ref 70–99)
Potassium: 4.9 mmol/L (ref 3.5–5.1)
Sodium: 139 mmol/L (ref 135–145)
Total Bilirubin: 0.2 mg/dL — ABNORMAL LOW (ref 0.3–1.2)
Total Protein: 7.5 g/dL (ref 6.5–8.1)

## 2018-07-13 MED ORDER — HEPARIN SOD (PORK) LOCK FLUSH 100 UNIT/ML IV SOLN
500.0000 [IU] | Freq: Once | INTRAVENOUS | Status: DC
  Filled 2018-07-13: qty 5

## 2018-07-13 MED ORDER — SODIUM CHLORIDE 0.9% FLUSH
10.0000 mL | INTRAVENOUS | Status: AC | PRN
  Filled 2018-07-13: qty 10

## 2018-07-13 NOTE — Progress Notes (Signed)
DISCONTINUE OFF PATHWAY REGIMEN - Ovarian   OFF00991:Cisplatin 25 mg/m2 D1,8 + Gemcitabine 1,000 mg/m2 D1,8 q21 Days:   A cycle is every 21 days:     Gemcitabine      Cisplatin   **Always confirm dose/schedule in your pharmacy ordering system**  REASON: Other Reason PRIOR TREATMENT: Off Pathway: Cisplatin 25 mg/m2 D1,8 + Gemcitabine 1,000 mg/m2 D1,8 q21 Days TREATMENT RESPONSE: Partial Response (PR)  START OFF PATHWAY REGIMEN - Ovarian   OFF00169:Carboplatin AUC=5 D1 + Gemcitabine 800 mg/m2 D1, 8 q28 Days:   A cycle is every 28 days:     Gemcitabine      Carboplatin   **Always confirm dose/schedule in your pharmacy ordering system**  Patient Characteristics: Recurrent or Progressive Disease, Fourth Line and Beyond, BRCA Mutation Absent/Unknown Therapeutic Status: Recurrent or Progressive Disease BRCA Mutation Status: Absent Line of Therapy: Fourth Line and Beyond  Intent of Therapy: Non-Curative / Palliative Intent, Discussed with Patient
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
## 2018-07-14 LAB — CA 125: Cancer Antigen (CA) 125: 99 U/mL — ABNORMAL HIGH (ref 0.0–38.1)

## 2018-07-17 ENCOUNTER — Telehealth: Payer: Self-pay | Admitting: Oncology

## 2018-07-17 ENCOUNTER — Inpatient Hospital Stay: Payer: 59 | Attending: Oncology

## 2018-07-17 ENCOUNTER — Inpatient Hospital Stay (HOSPITAL_BASED_OUTPATIENT_CLINIC_OR_DEPARTMENT_OTHER): Payer: 59 | Admitting: Oncology

## 2018-07-17 ENCOUNTER — Encounter: Payer: Self-pay | Admitting: Oncology

## 2018-07-17 ENCOUNTER — Other Ambulatory Visit: Payer: Self-pay

## 2018-07-17 VITALS — BP 132/78 | HR 63 | Resp 16

## 2018-07-17 VITALS — BP 139/72 | HR 63 | Temp 98.3°F | Resp 18 | Ht 63.0 in | Wt 131.7 lb

## 2018-07-17 DIAGNOSIS — G620 Drug-induced polyneuropathy: Secondary | ICD-10-CM

## 2018-07-17 DIAGNOSIS — D701 Agranulocytosis secondary to cancer chemotherapy: Secondary | ICD-10-CM | POA: Insufficient documentation

## 2018-07-17 DIAGNOSIS — C786 Secondary malignant neoplasm of retroperitoneum and peritoneum: Secondary | ICD-10-CM

## 2018-07-17 DIAGNOSIS — Z5189 Encounter for other specified aftercare: Secondary | ICD-10-CM | POA: Insufficient documentation

## 2018-07-17 DIAGNOSIS — C5701 Malignant neoplasm of right fallopian tube: Secondary | ICD-10-CM | POA: Insufficient documentation

## 2018-07-17 DIAGNOSIS — Z5111 Encounter for antineoplastic chemotherapy: Secondary | ICD-10-CM | POA: Insufficient documentation

## 2018-07-17 DIAGNOSIS — C762 Malignant neoplasm of abdomen: Secondary | ICD-10-CM

## 2018-07-17 DIAGNOSIS — Z79899 Other long term (current) drug therapy: Secondary | ICD-10-CM

## 2018-07-17 DIAGNOSIS — D6481 Anemia due to antineoplastic chemotherapy: Secondary | ICD-10-CM | POA: Diagnosis not present

## 2018-07-17 DIAGNOSIS — Z7189 Other specified counseling: Secondary | ICD-10-CM

## 2018-07-17 DIAGNOSIS — R188 Other ascites: Secondary | ICD-10-CM | POA: Diagnosis not present

## 2018-07-17 DIAGNOSIS — Z7951 Long term (current) use of inhaled steroids: Secondary | ICD-10-CM | POA: Insufficient documentation

## 2018-07-17 MED ORDER — SODIUM CHLORIDE 0.9 % IV SOLN
1254.0000 mg | Freq: Once | INTRAVENOUS | Status: AC
  Administered 2018-07-17: 1254 mg via INTRAVENOUS
  Filled 2018-07-17: qty 32.98

## 2018-07-17 MED ORDER — PALONOSETRON HCL INJECTION 0.25 MG/5ML
0.2500 mg | Freq: Once | INTRAVENOUS | Status: AC
  Administered 2018-07-17: 0.25 mg via INTRAVENOUS

## 2018-07-17 MED ORDER — SODIUM CHLORIDE 0.9 % IV SOLN: 10.0000 mg | Freq: Once | INTRAVENOUS | Status: DC

## 2018-07-17 MED ORDER — HEPARIN SOD (PORK) LOCK FLUSH 100 UNIT/ML IV SOLN
500.0000 [IU] | Freq: Once | INTRAVENOUS | Status: AC | PRN
  Administered 2018-07-17: 500 [IU]
  Filled 2018-07-17: qty 5

## 2018-07-17 MED ORDER — SODIUM CHLORIDE 0.9% FLUSH
10.0000 mL | INTRAVENOUS | Status: DC | PRN
  Administered 2018-07-17: 10 mL
  Filled 2018-07-17: qty 10

## 2018-07-17 MED ORDER — SODIUM CHLORIDE 0.9 % IV SOLN
180.0000 mg | Freq: Once | INTRAVENOUS | Status: AC
  Administered 2018-07-17: 180 mg via INTRAVENOUS
  Filled 2018-07-17: qty 18

## 2018-07-17 MED ORDER — DEXAMETHASONE SODIUM PHOSPHATE 10 MG/ML IJ SOLN
10.0000 mg | Freq: Once | INTRAMUSCULAR | Status: AC
  Administered 2018-07-17: 10 mg via INTRAVENOUS

## 2018-07-17 MED ORDER — SODIUM CHLORIDE 0.9 % IV SOLN
Freq: Once | INTRAVENOUS | Status: AC
  Administered 2018-07-17: 09:00:00 via INTRAVENOUS
  Filled 2018-07-17: qty 250

## 2018-07-17 MED ORDER — PALONOSETRON HCL INJECTION 0.25 MG/5ML
INTRAVENOUS | Status: AC
  Filled 2018-07-17: qty 5

## 2018-07-17 MED ORDER — PROCHLORPERAZINE MALEATE 10 MG PO TABS: 10.0000 mg | ORAL_TABLET | Freq: Four times a day (QID) | ORAL | 1 refills | Status: DC | PRN

## 2018-07-17 MED ORDER — DEXAMETHASONE SODIUM PHOSPHATE 10 MG/ML IJ SOLN
INTRAMUSCULAR | Status: AC
  Filled 2018-07-17: qty 1

## 2018-07-17 MED ORDER — DEXAMETHASONE 4 MG PO TABS: 8.0000 mg | ORAL_TABLET | Freq: Every day | ORAL | 1 refills | Status: DC

## 2018-07-17 NOTE — Patient Instructions (Signed)
[facility] Discharge Instructions for Patients Receiving Chemotherapy  Today you received the following chemotherapy agents:  Gemzar, Carboplatin  To help prevent nausea and vomiting after your treatment, we encourage you to take your nausea medication as prescribed.   If you develop nausea and vomiting that is not controlled by your nausea medication, call the clinic.   BELOW ARE SYMPTOMS THAT SHOULD BE REPORTED IMMEDIATELY:  *FEVER GREATER THAN 100.5 F  *CHILLS WITH OR WITHOUT FEVER  NAUSEA AND VOMITING THAT IS NOT CONTROLLED WITH YOUR NAUSEA MEDICATION  *UNUSUAL SHORTNESS OF BREATH  *UNUSUAL BRUISING OR BLEEDING  TENDERNESS IN MOUTH AND THROAT WITH OR WITHOUT PRESENCE OF ULCERS  *URINARY PROBLEMS  *BOWEL PROBLEMS  UNUSUAL RASH Items with * indicate a potential emergency and should be followed up as soon as possible.  Feel free to call the clinic should you have any questions or concerns. The clinic phone number is [phone].  Please show the CHEMO ALERT CARD at check-in to the Emergency Department and triage nurse.   

## 2018-07-17 NOTE — Telephone Encounter (Signed)
Scheduled appt per sch msg. Called and left patient a msg.  °

## 2018-07-18 ENCOUNTER — Other Ambulatory Visit: Payer: Self-pay | Admitting: Oncology

## 2018-07-18 ENCOUNTER — Other Ambulatory Visit: Payer: Self-pay

## 2018-07-18 ENCOUNTER — Inpatient Hospital Stay: Payer: 59

## 2018-07-18 DIAGNOSIS — D701 Agranulocytosis secondary to cancer chemotherapy: Secondary | ICD-10-CM

## 2018-07-18 DIAGNOSIS — T451X5A Adverse effect of antineoplastic and immunosuppressive drugs, initial encounter: Secondary | ICD-10-CM

## 2018-07-18 DIAGNOSIS — D702 Other drug-induced agranulocytosis: Secondary | ICD-10-CM

## 2018-07-18 DIAGNOSIS — Z5111 Encounter for antineoplastic chemotherapy: Secondary | ICD-10-CM | POA: Diagnosis not present

## 2018-07-18 DIAGNOSIS — Z95828 Presence of other vascular implants and grafts: Secondary | ICD-10-CM

## 2018-07-18 DIAGNOSIS — C762 Malignant neoplasm of abdomen: Secondary | ICD-10-CM

## 2018-07-18 MED ORDER — TBO-FILGRASTIM 480 MCG/0.8ML ~~LOC~~ SOSY
480.0000 ug | PREFILLED_SYRINGE | Freq: Once | SUBCUTANEOUS | Status: AC
  Administered 2018-07-18: 480 ug via SUBCUTANEOUS

## 2018-07-19 ENCOUNTER — Other Ambulatory Visit: Payer: Self-pay

## 2018-07-19 ENCOUNTER — Inpatient Hospital Stay: Payer: 59

## 2018-07-19 DIAGNOSIS — D702 Other drug-induced agranulocytosis: Secondary | ICD-10-CM

## 2018-07-19 DIAGNOSIS — Z95828 Presence of other vascular implants and grafts: Secondary | ICD-10-CM

## 2018-07-19 DIAGNOSIS — Z5111 Encounter for antineoplastic chemotherapy: Secondary | ICD-10-CM | POA: Diagnosis not present

## 2018-07-19 DIAGNOSIS — D701 Agranulocytosis secondary to cancer chemotherapy: Secondary | ICD-10-CM

## 2018-07-19 DIAGNOSIS — C762 Malignant neoplasm of abdomen: Secondary | ICD-10-CM

## 2018-07-19 MED ORDER — TBO-FILGRASTIM 480 MCG/0.8ML ~~LOC~~ SOSY
PREFILLED_SYRINGE | SUBCUTANEOUS | Status: AC
  Filled 2018-07-19: qty 0.8

## 2018-07-19 MED ORDER — TBO-FILGRASTIM 480 MCG/0.8ML ~~LOC~~ SOSY
480.0000 ug | PREFILLED_SYRINGE | Freq: Once | SUBCUTANEOUS | Status: AC
  Administered 2018-07-19: 480 ug via SUBCUTANEOUS

## 2018-07-20 ENCOUNTER — Other Ambulatory Visit: Payer: Self-pay | Admitting: Oncology

## 2018-07-23 NOTE — Progress Notes (Signed)
[place]  [phone] Fax:(336) [phone]    ID: Kathryn Ramsey DOB: [birth year]  MR#: [id number]  QQI#:[id number]  Patient Care Team: Harlan Stains, MD as PCP - General (Family Medicine) [name], MD as Consulting Physician (Obstetrics and Gynecology) Juanita Craver, MD as Consulting Physician (Gastroenterology) Servando Salina, MD as Consulting Physician (Obstetrics and Gynecology) Nickie Retort, MD as Consulting Physician (Urology) Gillis Ends, MD as Referring Physician (Obstetrics and Gynecology) OTHER MD: Festus Aloe [phone])   CHIEF COMPLAINT:  ovarian cancer  CURRENT TREATMENT: Cisplatin, gemcitabine; Aranesp; bevacizumab   INTERVAL HISTORY: Kathryn Ramsey was seen today for follow-up and treatment of her recurrent ovarian cancer.   She continues on adjuvant chemotherapy which was recently changed, because of neuropathy issues, to carboplatin (instead of cis-platinum) and gemcitabine on days 1 and 8 of each 21-day cycle. She also receives Granix support on days 2 and 3. Today is supposed to be day 8 cycle 1 of this combination, but her counts are too low and we will have to make some changes.   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 She wonders if her anxiety exacerbates it. She reports the gabapentin helps her sleep.  She also receives Aranesp every 3 weeks.  Her last dose was 07/03/2018.   We are following her tumor marker, which has stabilized. Lab Results  Component Value Date   CAN125 99.0 (H) 07/13/2018   CAN125 116.0 (H) 07/03/2018   CAN125 116.0 (H) 06/19/2018   CAN125 113.0 (H) 05/29/2018   CAN125 101.0 (H) 05/22/2018   She is scheduled for routine bilateral screening mammogram on 08/02/2018.   REVIEW OF SYSTEMS: Kathryn Ramsey reports doing well overall. She is tired today-- [place] she [place]. The patient denies unusual headaches,  visual changes, nausea, vomiting, stiff neck, dizziness, or gait imbalance. There has been no cough, phlegm production, or pleurisy, no chest pain or pressure, and no change in bowel or bladder habits. The patient denies fever, rash, bleeding, unexplained fatigue or unexplained weight loss. A detailed review of systems was otherwise entirely negative.   HISTORY OF PRESENT ILLNESS: From Dr. Serita Grit original intake note 03/17/2015:  "Kathryn Ramsey is a very pleasant G4P4 who is seen in consultation at the request of Dr Collene Mares and Dr Garwin Brothers for peritoneal carcinomatosis. The patient has a history of a workup by urology for gross hematuria which included a pelvic examine was suggested for extrinsic mass effect on the bladder on cystoscopy. Cystoscopy took place on in December 2016. The patient denies abdominal pain bloating early satiety or abdominal distention.  On 08/03/2015 at [facility] she underwent a CT scan of the abdomen and pelvis as ordered by Dr Baruch Gouty. This revealed a 1.4 cm low attenuation lesion on the posterior right hepatic lobe suggestive of a hemangioma. Status post hysterectomy. No ovarian masses. Moderate ascites. Omental caking seen in the lateral left abdomen and pelvis. Peritoneal nodularity in the left lateral pelvic cul-de-sac. No gross extrinsic compression on the bladder was identified on imaging.  The patient was then seen by her gastroenterologist, Dr. Collene Mares, who performed a colonoscopy which was unremarkable.  Tumor markers were drawn on 08/04/2015 and these included a CA-125 that was elevated to 957, and a CEA that was normal at 1."  On 03/27/2015 the patient underwent diagnostic laparoscopy, exploratory laparotomy with bilateral salpingo-oophorectomy, omentectomy, and radical tumor debulking with optimal cytoreduction (R0) of what proved  to be a stage IIIc right fallopian tube cancer. She was treated adjuvantly with carboplatin and paclitaxel, as detailed below.  Her CA 125 dropped from 1226 on 03/20/2015 to 26.1 by 06/16/2015.  Her subsequent history is as detailed above.   PAST MEDICAL HISTORY: Past Medical History:  Diagnosis Date  . Acute sensory neuropathy 06/26/2015  . Allergy   . Anemia   . Asthma    illness induced asthma  . Blood transfusion without reported diagnosis    at age 71 years old D/T surgery to femur being crushed  . 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 Heart murmur    pt, states had a "working heart murmur"  . History of kidney stones   . Neutropenia, drug-induced ([place]) 03/10/2018  . PONV (postoperative nausea and vomiting)   . Skin cancer     PAST SURGICAL HISTORY: Past Surgical History:  Procedure Laterality Date  . 3 laporscopic proceedures    . ABDOMINAL HYSTERECTOMY     2010  . CHOLECYSTECTOMY     2010  . DILATION AND CURETTAGE OF UTERUS     1997  . IR IMAGING GUIDED PORT INSERTION  03/08/2018  . LAPAROSCOPY N/A 03/27/2015   Procedure: DIAGNOSTIC LAPAROSCOPY ;  Surgeon: [name], MD;  Location: [facility];  Service: Gynecology;  Laterality: N/A;  . LAPAROSCOPY N/A 02/24/2016   Procedure: LAPAROSCOPY DIAGNOSTIC WITH PERITONEAL WASHINGS AND PERITONEAL BIOPSY;  Surgeon: [name], MD;  Location: [facility];  Service: Gynecology;  Laterality: N/A;  . LAPAROTOMY N/A 03/27/2015   Procedure: EXPLORATORY LAPAROTOMY, BILATERAL SALPINGO OOPHORECTOMY, OMENTECTOMY, RADICAL TUMOR DEBULKING;  Surgeon: [name], MD;  Location: [facility];  Service: Gynecology;  Laterality: N/A;  . sinus surgery 1994      FAMILY HISTORY Family History  Problem Relation Age of Onset  . Hypertension Father   . Skin cancer Father        nonmelanoma skin cancers in his late 66s  . Non-Hodgkin's lymphoma Maternal Aunt        dx. 24s; smoker  . Stroke Maternal Grandmother   . Other Mother        benign meningioma dx. early-mid-70s; hysterectomy in her late 2s for heavy  periods - still has ovaries  . Other Son        one son with pre-cancerous skin findings  . Other Daughter        cysts on ovaries and hx of heavy periods  . Other Sister        hysterectomy for cysts - still has ovaries  . Heart attack Maternal Grandfather   . Heart attack Paternal Grandfather   . Renal cancer Maternal Aunt 60       smoker  . Breast cancer Maternal Aunt 78  . Other Maternal Aunt        dx. benign brain tumor (meningioma) at 62; 2nd benign brain tumor in her late 55s; hx of radical hysterectomy at age 61  . Brain cancer Other        NOS tumor  The patient's parents are both living, in their late 40s as of January 2018. The patient has one brother, 3 sisters. There is no history of ovarian cancer in the family. One maternal aunt had breast and kidney cancer, another had non-Hodgkin's lymphoma.   GYNECOLOGIC HISTORY:  No LMP recorded. Patient has had a hysterectomy. 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 Her husband [name] is a Engineer, maintenance (IT). Their children are 10, 27, 32 and 11 y/o as of JAN 2018. The oldest works as an Electrical engineer in the [employer] in [place]. The other 3 children live in [place], the youngest currently attending [employer]. The patient has no grandchildren. She attends [employer]   ADVANCED DIRECTIVES:    HEALTH MAINTENANCE: Social History   Tobacco Use  . Smoking status: Never Smoker  . Smokeless tobacco: Never Used  Substance Use Topics  . Alcohol use: Yes    Comment:  3 glasses wine or beer/week  . Drug use: No    Colonoscopy:2016  PAP: s/p hyst  Bone density:   Allergies  Allergen Reactions  . Bee Venom Shortness Of Breath    swelling  . Compazine [Prochlorperazine Edisylate] Anaphylaxis, Anxiety and Palpitations  . Sulfa Antibiotics Shortness Of Breath    Vomit , diarrhea , hives  . Zarxio [Filgrastim]      Current Outpatient Medications  Medication Sig Dispense Refill  . diphenhydrAMINE (BENADRYL) 50 MG capsule 50 mg Diphenhydramine (Benadryl) PO within 1 hour of the injection 1 capsule 0  . fluticasone (FLONASE) 50 MCG/ACT nasal spray Place 1 spray into both nostrils 2 (two) times daily. (Patient taking differently: Place 1 spray into both nostrils as needed. ) 16 g 2  . gabapentin (NEURONTIN) 100 MG capsule Take 1 capsule (100 mg total) by mouth 3 (three) times daily. 270 capsule 3  . lidocaine-prilocaine (EMLA) cream APPLY TOPICALLY AS NEEDED. 30 g 0  . loratadine (CLARITIN) 10 MG tablet Take 10 mg by mouth as needed.     . magnesium oxide (MAG-OX) 400 (241.3 Mg) MG tablet Take 1 tablet (400 mg total) by mouth daily. 60 tablet 3  . omeprazole (PRILOSEC) 40 MG capsule Take 1 capsule (40 mg total) by mouth daily. 30 capsule 5  . prochlorperazine (COMPAZINE) 10 MG tablet Take 1 tablet (10 mg total) by mouth every 6 (six) hours as needed (Nausea or vomiting). 30 tablet 1  . sertraline (ZOLOFT) 50 MG tablet TAKE 1 TABLET ONCE DAILY. 30 tablet 0  . traMADol (ULTRAM) 50 MG tablet Take 1 tablet (50 mg total) by mouth every 6 (six) hours as needed. 30 tablet 0  . valACYclovir (VALTREX) 1000 MG tablet Take 1 tablet (1,000 mg total) by mouth daily. 5 tablet 0   No current facility-administered medications for this visit.    Facility-Administered Medications Ordered in Other Visits  Medication Dose Route Frequency Provider Last Rate Last Dose  . heparin lock flush 100 unit/mL  500 Units Intravenous Once Yutaka Holberg, Virgie Dad, MD      . sodium chloride flush (NS) 0.9 % injection 10 mL  10 mL Intravenous PRN Bradie Sangiovanni, Virgie Dad, MD        OBJECTIVE: Middle-aged white woman in no acute distress  Vitals:   07/24/18 0907  BP: (!) 144/74  Pulse: 69  Resp: 16  Temp: 98.7 F (37.1 C)  SpO2: 100%     Body mass index is 23.26 kg/m.   ECOG FS:1 - Symptomatic but completely ambulatory   LAB RESULTS:   CMP     Component Value Date/Time   NA 139 07/13/2018 0909   NA 140 02/11/2017 0755   K 4.9 07/13/2018 0909   K 4.4 02/11/2017 0755   CL 106 07/13/2018 0909  CO2 24 07/13/2018 0909   CO2 26 02/11/2017 0755   GLUCOSE 96 07/13/2018 0909   GLUCOSE 93 02/11/2017 0755   BUN 17 07/13/2018 0909   BUN 7.9 02/11/2017 0755   CREATININE 1.07 (H) 07/13/2018 0909   CREATININE 1.06 (H) 06/19/2018 0931   CREATININE 0.7 02/11/2017 0755   CALCIUM 9.7 07/13/2018 0909   CALCIUM 9.6 02/11/2017 0755   PROT 7.5 07/13/2018 0909   PROT 7.5 02/11/2017 0755   ALBUMIN 4.5 07/13/2018 0909   ALBUMIN 4.4 02/11/2017 0755   AST 23 07/13/2018 0909   AST 22 06/19/2018 0931   AST 27 02/11/2017 0755   ALT 25 07/13/2018 0909   ALT 13 06/19/2018 0931   ALT 24 02/11/2017 0755   ALKPHOS 111 07/13/2018 0909   ALKPHOS 58 02/11/2017 0755   BILITOT 0.2 (L) 07/13/2018 0909   BILITOT 0.2 (L) 06/19/2018 0931   BILITOT 0.43 02/11/2017 0755   GFRNONAA 58 (L) 07/13/2018 0909   GFRNONAA 59 (L) 06/19/2018 0931   GFRAA >60 07/13/2018 0909   GFRAA >60 06/19/2018 0931    INo results found for: SPEP, UPEP  Lab Results  Component Value Date   WBC 2.1 (L) 07/24/2018   NEUTROABS 0.5 (L) 07/24/2018   HGB 8.7 (L) 07/24/2018   HCT 27.2 (L) 07/24/2018   MCV 109.7 (H) 07/24/2018   PLT 90 (L) 07/24/2018      Chemistry      Component Value Date/Time   NA 139 07/13/2018 0909   NA 140 02/11/2017 0755   K 4.9 07/13/2018 0909   K 4.4 02/11/2017 0755   CL 106 07/13/2018 0909   CO2 24 07/13/2018 0909   CO2 26 02/11/2017 0755   BUN 17 07/13/2018 0909   BUN 7.9 02/11/2017 0755   CREATININE 1.07 (H) 07/13/2018 0909   CREATININE 1.06 (H) 06/19/2018 0931   CREATININE 0.7 02/11/2017 0755      Component Value Date/Time   CALCIUM 9.7 07/13/2018 0909   CALCIUM 9.6 02/11/2017 0755   ALKPHOS 111 07/13/2018 0909   ALKPHOS 58 02/11/2017 0755   AST 23 07/13/2018 0909   AST 22 06/19/2018 0931   AST 27 02/11/2017 0755   ALT  25 07/13/2018 0909   ALT 13 06/19/2018 0931   ALT 24 02/11/2017 0755   BILITOT 0.2 (L) 07/13/2018 0909   BILITOT 0.2 (L) 06/19/2018 0931   BILITOT 0.43 02/11/2017 0755       No results found for: LABCA2  No components found for: LABCA125  No results for input(s): INR in the last 168 hours.  Urinalysis    Component Value Date/Time   COLORURINE STRAW (A) 11/22/2017 0940   APPEARANCEUR CLEAR 11/22/2017 0940   LABSPEC 1.002 (L) 11/22/2017 0940   LABSPEC 1.005 10/09/2015 1239   PHURINE 7.0 11/22/2017 0940   GLUCOSEU NEGATIVE 11/22/2017 0940   GLUCOSEU Negative 10/09/2015 1239   HGBUR NEGATIVE 11/22/2017 0940   BILIRUBINUR NEGATIVE 11/22/2017 0940   BILIRUBINUR Negative 10/09/2015 1239   KETONESUR NEGATIVE 11/22/2017 0940   PROTEINUR NEGATIVE 11/22/2017 0940   UROBILINOGEN 0.2 10/09/2015 1239   NITRITE NEGATIVE 11/22/2017 0940   LEUKOCYTESUR NEGATIVE 11/22/2017 0940   LEUKOCYTESUR Negative 10/09/2015 1239    STUDIES: No results found.  ELIGIBLE FOR AVAILABLE RESEARCH PROTOCOL:  discussing protocols at [facility] at [place]: 53 y.o. BRCA negative [place] woman status post radical tumor debulking with optimal cytoreduction (R0) 03/27/2015 for a stage IIIC, high-grade right fallopian tube carcinoma  (a) baseline CA-125 was  1226.  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)  (c) tumor is PD-L1 negative (02/24/2016)  (d) tumor is strongly estrogen receptor positive, progesterone receptor negative (02/24/2016)  (1) adjuvant chemotherapy consisted of carboplatin and paclitaxel for 6 doses, begun 04/14/2015, completed 08/11/2015  (a) paclitaxel was omitted from cycle 5 and dose reduced on cycle 6 because of neuropathy   (b) a "make up" dose of paclitaxel was given 08/25/2015  (c) last  carboplatin dose was 08/11/2015  (d) CA-125 normalized by 06/16/2015   (2) FIRST RECURRENCE: January 2018  (a) CA-125 rise beginning November 2017 led to CT scans and PET scans December 2017, all negative  (b) exploratory laparotomy 02/24/2016 showed pathologically confirmed recurrence, with miliary disease involving all examined surfaces  (c) port-site metastases noted 03/08/2016  (d) the January 2018 tumor sample was tested for the estrogen receptor and he was 80 percent positive with strong staining, progesterone receptor negative  (3) carboplatin/liposomal doxorubicin started 03/05/2016, repeated every three weeks x4, completed 06/04/2016.  (a) cycle 3 delayed one week because of neutropenia; OnPro added  (b) CA 125 normalized after cycle 3  (4) RUCAPARIB maintenance started 07/02/2016  (a) restaging 10/01/2016: Normal CA 125, negative CT of the abdomen and pelvis  (b) labs 11/24/2016 shows a rise in her CA 125-43.4, with continuing rise thereafter  (c) rucaparib discontinued October 2018 with rising CA 125  (5) anastrozole started December 23, 2016-stopped after a couple of weeks with poor tolerance  SECOND RECURRENCE: (6) Gemcitabine/Bevacizumab started on 02/04/2017  (a) gemcitabine omitted cycle 4 because of intercurrent infection  (b) gemcitabine resumed with cycle 5, with intervening rise in  Ca 125  (c) repeat CT scan of the abdomen and pelvis on 05/26/2017 does not confirm obvious disease progression  (d) cycle 5 of gemcitabine/bevacizumab delayed 1 week   (e) gemcitabine/ bevacizumab discontinued after 6 cycles, with rising CA 125 (last dose 06/24/2017)  (7) foundation one testing did not show a high mutation burden and the microsatellite status was stable.  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  These were not immediately actionable  (8) Abraxane started 07/12/2017, given weekly or weeks 1 and 2 of each 3 weeks cycle, last dose 11/15/2017 (5 cycles)  (a) cycle 2  started 08/09/2017, will be day 1 day 8 only  (b) cycle 3 scheduled to start 09/13/2017, day 1 day 8 at patient request  (c) CT of the abdomen and pelvis 11/28/2017 shows no measurable disease  (d) PET scan 01/25/2018 and MRI of the pelvis 02/03/2018 show no measurable disease  (e) patient has symptomatic ascites and a rapidly rising Ca1 25  (9) started cisplatin/gemcitabine days 1 and 8 of each 21-day cycle on 02/18/2018  (a) switched to carboplatin/ gemcitabine 07/17/2018 due to neuropathy  (b) carbo/gemzar changed to Q14 days 07/24/2018 due to neutropenia   PLAN: Kathryn Ramsey did receive 2 doses of Granix after her day 1 carboplatin/Gemzar dose, but despite that today her [place] is 500.  Obviously we cannot treat her today and we discussed the various options.  Of course I can give her more Granix and try to treat her next week.  If I did that I would drop the doses of chemo.  Alternatively we can switch treatment to every 2 weeks with OnPro support.  In that case I would give her the current dose.  She would prefer the latter approach.  This gives her more time on her own without having to be coming here all the time for Granix particularly as she enjoys going to the [facility] and being with family.  In terms of effectiveness I would expect similar results.  The bottom line of course is that her marrow is fairly exhausted and we have to take that into account as we move forward with her treatment.  Accordingly she will not be treated today.  She will receive OnPro today.  She will return 08/07/2018 and will receive chemo that day.  After she has been on the current treatment at least 2 months we will consider restaging her.  In the meantime of course we are continuing to follow the Ca1 25  I do not have a solution for the neuropathy she has developed from the cis-platinum.  I suggest that she increase her gabapentin.  She will work with that.  She knows to call for any other issue that may develop  before the next visit. Terez Montee, Virgie Dad, MD  07/24/18 9:26 AM Medical Oncology and Hematology [facility] [address] [place], [place] [address] Tel. [phone]    Fax. [phone]    I, Wilburn Mylar, am acting as scribe for Dr. Virgie Dad. Aliya Sol.  I, Lurline Del MD, have reviewed the above documentation for accuracy and completeness, and I agree with the above.

## 2018-07-24 ENCOUNTER — Inpatient Hospital Stay: Payer: 59

## 2018-07-24 ENCOUNTER — Other Ambulatory Visit: Payer: Self-pay

## 2018-07-24 ENCOUNTER — Inpatient Hospital Stay (HOSPITAL_BASED_OUTPATIENT_CLINIC_OR_DEPARTMENT_OTHER): Payer: 59 | Admitting: Oncology

## 2018-07-24 VITALS — BP 144/74 | HR 69 | Temp 98.7°F | Resp 16 | Ht 63.0 in | Wt 131.3 lb

## 2018-07-24 DIAGNOSIS — C5701 Malignant neoplasm of right fallopian tube: Secondary | ICD-10-CM

## 2018-07-24 DIAGNOSIS — D701 Agranulocytosis secondary to cancer chemotherapy: Secondary | ICD-10-CM

## 2018-07-24 DIAGNOSIS — Z7189 Other specified counseling: Secondary | ICD-10-CM

## 2018-07-24 DIAGNOSIS — C762 Malignant neoplasm of abdomen: Secondary | ICD-10-CM

## 2018-07-24 DIAGNOSIS — T451X5A Adverse effect of antineoplastic and immunosuppressive drugs, initial encounter: Secondary | ICD-10-CM

## 2018-07-24 DIAGNOSIS — G620 Drug-induced polyneuropathy: Secondary | ICD-10-CM | POA: Insufficient documentation

## 2018-07-24 DIAGNOSIS — C786 Secondary malignant neoplasm of retroperitoneum and peritoneum: Secondary | ICD-10-CM

## 2018-07-24 DIAGNOSIS — Z79899 Other long term (current) drug therapy: Secondary | ICD-10-CM

## 2018-07-24 DIAGNOSIS — Z7951 Long term (current) use of inhaled steroids: Secondary | ICD-10-CM

## 2018-07-24 DIAGNOSIS — Z5111 Encounter for antineoplastic chemotherapy: Secondary | ICD-10-CM | POA: Diagnosis not present

## 2018-07-24 DIAGNOSIS — R188 Other ascites: Secondary | ICD-10-CM

## 2018-07-24 DIAGNOSIS — D702 Other drug-induced agranulocytosis: Secondary | ICD-10-CM

## 2018-07-24 DIAGNOSIS — N951 Menopausal and female climacteric states: Secondary | ICD-10-CM

## 2018-07-24 DIAGNOSIS — Z95828 Presence of other vascular implants and grafts: Secondary | ICD-10-CM

## 2018-07-24 LAB — COMPREHENSIVE METABOLIC PANEL
ALT: 44 U/L (ref 0–44)
AST: 39 U/L (ref 15–41)
Albumin: 4.3 g/dL (ref 3.5–5.0)
Alkaline Phosphatase: 96 U/L (ref 38–126)
Anion gap: 10 (ref 5–15)
BUN: 20 mg/dL (ref 6–20)
CO2: 21 mmol/L — ABNORMAL LOW (ref 22–32)
Calcium: 9 mg/dL (ref 8.9–10.3)
Chloride: 107 mmol/L (ref 98–111)
Creatinine, Ser: 0.99 mg/dL (ref 0.44–1.00)
GFR calc Af Amer: >60 mL/min (ref >60)
GFR calc non Af Amer: >60 mL/min (ref >60)
Glucose, Bld: 88 mg/dL (ref 70–99)
Potassium: 4.7 mmol/L (ref 3.5–5.1)
Sodium: 138 mmol/L (ref 135–145)
Total Bilirubin: 0.5 mg/dL (ref 0.3–1.2)
Total Protein: 7.3 g/dL (ref 6.5–8.1)

## 2018-07-24 LAB — CBC WITH DIFFERENTIAL/PLATELET
Abs Immature Granulocytes: 0 10*3/uL (ref 0.00–0.07)
Basophils Absolute: 0 10*3/uL (ref 0.0–0.1)
Basophils Relative: 1 %
Eosinophils Absolute: 0 10*3/uL (ref 0.0–0.5)
Eosinophils Relative: 0 %
HCT: 27.2 % — ABNORMAL LOW (ref 36.0–46.0)
Hemoglobin: 8.7 g/dL — ABNORMAL LOW (ref 12.0–15.0)
Immature Granulocytes: 0 %
Lymphocytes Relative: 69 %
Lymphs Abs: 1.4 10*3/uL (ref 0.7–4.0)
MCH: 35.1 pg — ABNORMAL HIGH (ref 26.0–34.0)
MCHC: 32 g/dL (ref 30.0–36.0)
MCV: 109.7 fL — ABNORMAL HIGH (ref 80.0–100.0)
Monocytes Absolute: 0.2 10*3/uL (ref 0.1–1.0)
Monocytes Relative: 7 %
Neutro Abs: 0.5 10*3/uL — ABNORMAL LOW (ref 1.7–7.7)
Neutrophils Relative %: 23 %
Platelets: 90 10*3/uL — ABNORMAL LOW (ref 150–400)
RBC: 2.48 MIL/uL — ABNORMAL LOW (ref 3.87–5.11)
RDW: 14.4 % (ref 11.5–15.5)
WBC: 2.1 10*3/uL — ABNORMAL LOW (ref 4.0–10.5)
nRBC: 0 % (ref 0.0–0.2)

## 2018-07-24 MED ORDER — HEPARIN SOD (PORK) LOCK FLUSH 100 UNIT/ML IV SOLN
500.0000 [IU] | Freq: Once | INTRAVENOUS | Status: AC | PRN
  Administered 2018-07-24: 500 [IU]
  Filled 2018-07-24: qty 5

## 2018-07-24 MED ORDER — SODIUM CHLORIDE 0.9% FLUSH
10.0000 mL | INTRAVENOUS | Status: DC | PRN
  Administered 2018-07-24: 10 mL
  Filled 2018-07-24: qty 10

## 2018-07-24 MED ORDER — PEGFILGRASTIM 6 MG/0.6ML ~~LOC~~ PSKT
PREFILLED_SYRINGE | SUBCUTANEOUS | Status: AC
  Filled 2018-07-24: qty 0.6

## 2018-07-24 MED ORDER — PEGFILGRASTIM 6 MG/0.6ML ~~LOC~~ PSKT
6.0000 mg | PREFILLED_SYRINGE | Freq: Once | SUBCUTANEOUS | Status: AC
  Administered 2018-07-24: 6 mg via SUBCUTANEOUS

## 2018-07-24 NOTE — Progress Notes (Signed)
Reviewed giving Neulasta instead of Onpro today since patient isn't getting chemo today. Medication could be delivered sooner without need to wear Onpro device. Neulasta Onpro already scanned and given to patient. Patient left clinic prior to pharmacist verification of the order. Medication scanned without override per RN.  Neulasta Onpro given per MD plan. Pt to return 6/22 for chemo.  Hardie Pulley, PharmD, BCPS, BCOP

## 2018-07-24 NOTE — Patient Instructions (Signed)
Pegfilgrastim injection What is this medicine? PEGFILGRASTIM (PEG fil gra stim) is a long-acting granulocyte colony-stimulating factor that stimulates the growth of neutrophils, a type of white blood cell important in the body's fight against infection. It is used to reduce the incidence of fever and infection in patients with certain types of cancer who are receiving chemotherapy that affects the bone marrow, and to increase survival after being exposed to high doses of radiation. This medicine may be used for other purposes; ask your health care provider or pharmacist if you have questions. COMMON BRAND NAME(S): Domenic Moras, UDENYCA What should I tell my health care provider before I take this medicine? They need to know if you have any of these conditions: -kidney disease -latex allergy -ongoing radiation therapy -sickle cell disease -skin reactions to acrylic adhesives (On-Body Injector only) -an unusual or allergic reaction to pegfilgrastim, filgrastim, other medicines, foods, dyes, or preservatives -pregnant or trying to get pregnant -breast-feeding How should I use this medicine? This medicine is for injection under the skin. If you get this medicine at home, you will be taught how to prepare and give the pre-filled syringe or how to use the On-body Injector. Refer to the patient Instructions for Use for detailed instructions. Use exactly as directed. Tell your healthcare provider immediately if you suspect that the On-body Injector may not have performed as intended or if you suspect the use of the On-body Injector resulted in a missed or partial dose. It is important that you put your used needles and syringes in a special sharps container. Do not put them in a trash can. If you do not have a sharps container, call your pharmacist or healthcare provider to get one. Talk to your pediatrician regarding the use of this medicine in children. While this drug may be prescribed for  selected conditions, precautions do apply. Overdosage: If you think you have taken too much of this medicine contact a poison control center or emergency room at once. NOTE: This medicine is only for you. Do not share this medicine with others. What if I miss a dose? It is important not to miss your dose. Call your doctor or health care professional if you miss your dose. If you miss a dose due to an On-body Injector failure or leakage, a new dose should be administered as soon as possible using a single prefilled syringe for manual use. What may interact with this medicine? Interactions have not been studied. Give your health care provider a list of all the medicines, herbs, non-prescription drugs, or dietary supplements you use. Also tell them if you smoke, drink alcohol, or use illegal drugs. Some items may interact with your medicine. This list may not describe all possible interactions. Give your health care provider a list of all the medicines, herbs, non-prescription drugs, or dietary supplements you use. Also tell them if you smoke, drink alcohol, or use illegal drugs. Some items may interact with your medicine. What should I watch for while using this medicine? You may need blood work done while you are taking this medicine. If you are going to need a MRI, CT scan, or other procedure, tell your doctor that you are using this medicine (On-Body Injector only). What side effects may I notice from receiving this medicine? Side effects that you should report to your doctor or health care professional as soon as possible: -allergic reactions like skin rash, itching or hives, swelling of the face, lips, or tongue -back pain -dizziness -fever -pain, redness,  or irritation at site where injected -pinpoint red spots on the skin -red or dark-brown urine -shortness of breath or breathing problems -stomach or side pain, or pain at the shoulder -swelling -tiredness -trouble passing urine or  change in the amount of urine Side effects that usually do not require medical attention (report to your doctor or health care professional if they continue or are bothersome): -bone pain -muscle pain This list may not describe all possible side effects. Call your doctor for medical advice about side effects. You may report side effects to FDA at 1-800-FDA-1088. Where should I keep my medicine? Keep out of the reach of children. If you are using this medicine at home, you will be instructed on how to store it. Throw away any unused medicine after the expiration date on the label. NOTE: This sheet is a summary. It may not cover all possible information. If you have questions about this medicine, talk to your doctor, pharmacist, or health care provider.  2019 Elsevier/Gold Standard (2017-05-09 16:57:08)  Coronavirus (COVID-19) Are you at risk?  Are you at risk for the Coronavirus (COVID-19)?  To be considered HIGH RISK for Coronavirus (COVID-19), you have to meet the following criteria:  . Traveled to [place], [place], [place], [place] or [place]; or in the [place] to [place], [place], [place], or [place]; and have fever, cough, and shortness of breath within the last 2 weeks of travel OR . Been in close contact with a person diagnosed with COVID-19 within the last 2 weeks and have fever, cough, and shortness of breath . IF YOU DO NOT MEET THESE CRITERIA, YOU ARE CONSIDERED LOW RISK FOR COVID-19.  What to do if you are HIGH RISK for COVID-19?  [name] If you are having a medical emergency, call 911. . Seek medical care right away. Before you go to a doctor's office, urgent care or emergency department, call ahead and tell them about your recent travel, contact with someone diagnosed with COVID-19, and your symptoms. You should receive instructions from your physician's office regarding next steps of care.  . When you arrive at healthcare provider, tell the healthcare staff immediately  you have returned from visiting [place], [place], [place], [place] or [place]; or traveled in the [place] to [place], [place], [place], or [place]; in the last two weeks or you have been in close contact with a person diagnosed with COVID-19 in the last 2 weeks.   . Tell the health care staff about your symptoms: fever, cough and shortness of breath. . After you have been seen by a medical provider, you will be either: o Tested for (COVID-19) and discharged home on quarantine except to seek medical care if symptoms worsen, and asked to  - Stay home and avoid contact with others until you get your results (4-5 days)  - Avoid travel on public transportation if possible (such as bus, train, or airplane) or o Sent to the Emergency Department by EMS for evaluation, COVID-19 testing, and possible admission depending on your condition and test results.  What to do if you are LOW RISK for COVID-19?  Reduce your risk of any infection by using the same precautions used for avoiding the common cold or flu:  [name] Wash your hands often with soap and warm water for at least 20 seconds.  If soap and water are not readily available, use an alcohol-based hand sanitizer with at least 60% alcohol.  . If coughing or sneezing, cover  your mouth and nose by coughing or sneezing into the elbow areas of your shirt or coat, into a tissue or into your sleeve (not your hands). . Avoid shaking hands with others and consider head nods or verbal greetings only. . Avoid touching your eyes, nose, or mouth with unwashed hands.  . Avoid close contact with people who are sick. . Avoid places or events with large numbers of people in one location, like concerts or sporting events. . Carefully consider travel plans you have or are making. . If you are planning any travel outside or inside the [name], visit the CDC's Travelers' Health webpage for the latest health notices. . If you have some symptoms but not all symptoms, continue  to monitor at home and seek medical attention if your symptoms worsen. . If you are having a medical emergency, call 911.   [place] / e-Visit: [email or web address]         [facility]: [facility]: [phone]                   [facility]: [phone]

## 2018-07-25 ENCOUNTER — Telehealth: Payer: Self-pay | Admitting: Oncology

## 2018-07-25 ENCOUNTER — Telehealth: Payer: Self-pay

## 2018-07-25 LAB — CA 125: Cancer Antigen (CA) 125: 115 U/mL — ABNORMAL HIGH (ref 0.0–38.1)

## 2018-07-25 NOTE — Telephone Encounter (Signed)
Talk with patient regarding schedule °

## 2018-07-25 NOTE — Telephone Encounter (Signed)
Pt called for CA 125 results. Pt also request to reschedule 7/20 appts for 7/17.  msg sent to schedulers.

## 2018-07-26 ENCOUNTER — Telehealth: Payer: Self-pay | Admitting: Neurology

## 2018-07-26 MED ORDER — GABAPENTIN 100 MG PO CAPS: 200.0000 mg | ORAL_CAPSULE | Freq: Three times a day (TID) | ORAL | 1 refills | Status: DC

## 2018-07-26 NOTE — Telephone Encounter (Signed)
I called the patient.  The patient has undergone a course of cisplatin, she began having some numbness in the feet intermittently on this chemotherapy agent, and subsequently was switched over to carboplatin.  The patient has continued to progress some, she is now having intermittent numbness up to the knee level and numbness in the hands with paresthesias.  When she walks at times, her legs may feel somewhat weak.  The patient is however having some problems with anemia on the chemotherapy.  The patient has gabapentin, but she is taking only very low-dose of the drug, she is on the 100 mg capsules.  I indicated that she easily could double her current dose taking 200 mg 3 times daily, I indicated that unlike Taxol, cisplatin causes a true peripheral neuropathy, this can heal following cessation of the chemotherapy agent.  A prescription will be sent in for the gabapentin.

## 2018-07-27 ENCOUNTER — Telehealth: Payer: Self-pay | Admitting: Oncology

## 2018-07-27 NOTE — Telephone Encounter (Signed)
R/s appt per 6/09 sch message - unable to reach pt . Left message with appt date and time  7/20 r/s to 7/17

## 2018-08-02 ENCOUNTER — Other Ambulatory Visit: Payer: Self-pay

## 2018-08-02 ENCOUNTER — Ambulatory Visit
Admission: RE | Admit: 2018-08-02 | Discharge: 2018-08-02 | Disposition: A | Discharge status: Home / Self Care | Payer: 59 | Source: Ambulatory Visit | Attending: Oncology | Admitting: Oncology

## 2018-08-02 DIAGNOSIS — Z1231 Encounter for screening mammogram for malignant neoplasm of breast: Secondary | ICD-10-CM

## 2018-08-06 NOTE — Progress Notes (Signed)
[place]  [phone] [phone]    ID: Kathryn Ramsey DOB: 02-Apr-1965  MR#: [id number]  OEU#:[id number]  Patient Care Team: Harlan Stains, MD as PCP - General (Family Medicine) [name], MD as Consulting Physician (Obstetrics and Gynecology) Juanita Craver, MD as Consulting Physician (Gastroenterology) Servando Salina, MD as Consulting Physician (Obstetrics and Gynecology) Nickie Retort, MD as Consulting Physician (Urology) Gillis Ends, MD as Referring Physician (Obstetrics and Gynecology) OTHER MD: Festus Aloe [phone]) Floyde Parkins   CHIEF COMPLAINT:  ovarian cancer  CURRENT TREATMENT: carboplatin, gemcitabine; Aranesp; bevacizumab   INTERVAL HISTORY: Kathryn Ramsey returns today for follow-up and treatment of her recurrent ovarian cancer.  Her adjuvant chemotherapy was changed, because of neuropathy issues, to carboplatin (instead of cis-platinum) and gemcitabine on days 1 and 8 of each 21-day cycle. She also received Granix support on days 2 and 3.  However on day 8 cycle 1 her ANC was still 500 despite the Granix.  At that point we decided to change the treatment to every 2weeks with on pro support.  She is here today to receive the first dose of this new schedule.   She states that her belly feels better compared to in the past; the sensation of something "catching" in her right upper quadrant is not there right now.  She believes that it takes a while after chemo to get her bowels moving. She generally takes a stool softener, but she will take Miralax if she need is. She is having neuropathy, with numbness reaching up to her knees sometimes when she goes walking; it is more painful at night; she is currently on 200 mg of gabapentin at bedtime and that is helping some  She also receives Aranesp currently every 3 weeks, with her last dose received on 07/02/2016.  Since her last visit here, she underwent a digital  screening bilateral mammogram with tomography on 08/02/2018 showing: Breast Density Category C. There is no mammographic evidence of malignancy.    We are following her tumor marker, which has stabilized.   REVIEW OF SYSTEMS: Kathryn Ramsey celebrated her 33rd anniversary with her husband this past weekend. She notes that she gets more winded than in the past. The patient denies unusual headaches, visual changes, nausea, vomiting, or dizziness. There has been no unusual cough, phlegm production, or pleurisy. This been no change in bowel or bladder habits. The patient denies unexplained fatigue or unexplained weight loss, bleeding, rash, or fever. A detailed review of systems was otherwise noncontributory.    HISTORY OF PRESENT ILLNESS: From Dr. Serita Grit original intake note 03/17/2015:  "Kathryn Ramsey is a very pleasant G4P4 who is seen in consultation at the request of Dr Collene Mares and Dr Garwin Brothers for peritoneal carcinomatosis. The patient has a history of a workup by urology for gross hematuria which included a pelvic examine was suggested for extrinsic mass effect on the bladder on cystoscopy. Cystoscopy took place on in December 2016. The patient denies abdominal pain bloating early satiety or abdominal distention.  On 08/03/2015 at [facility] she underwent a CT scan of the abdomen and pelvis as ordered by Dr Baruch Gouty. This revealed a 1.4 cm low attenuation lesion on the posterior right hepatic lobe suggestive of a hemangioma. Status post hysterectomy. No ovarian masses. Moderate ascites. Omental caking seen in the lateral left abdomen and pelvis. Peritoneal nodularity in the left lateral pelvic cul-de-sac. No gross extrinsic compression on the bladder was identified on imaging.  The patient was then seen by  her gastroenterologist, Dr. Collene Mares, who performed a colonoscopy which was unremarkable.  Tumor markers were drawn on 08/04/2015 and these included a CA-125 that was elevated to 957, and a CEA that  was normal at 1."  On 03/27/2015 the patient underwent diagnostic laparoscopy, exploratory laparotomy with bilateral salpingo-oophorectomy, omentectomy, and radical tumor debulking with optimal cytoreduction (R0) of what proved to be a stage IIIc right fallopian tube cancer. She was treated adjuvantly with carboplatin and paclitaxel, as detailed below. Her CA 125 dropped from 1226 on 03/20/2015 to 26.1 by 06/16/2015.  Her subsequent history is as detailed above.   PAST MEDICAL HISTORY: Past Medical History:  Diagnosis Date   Acute sensory neuropathy 06/26/2015   Allergy    Anemia    Asthma    illness induced asthma   Blood transfusion without reported diagnosis    at age 23 years old D/T surgery to femur being crushed   Complication of anesthesia    hx. of allergic to ether (had surgery at 5 years and had reaction to the ether   Heart murmur    pt, states had a "working heart murmur"   History of kidney stones    Neutropenia, drug-induced ([place]) 03/10/2018   PONV (postoperative nausea and vomiting)    Skin cancer     PAST SURGICAL HISTORY: Past Surgical History:  Procedure Laterality Date   3 laporscopic proceedures     ABDOMINAL HYSTERECTOMY     2010   CHOLECYSTECTOMY     2010   DILATION AND CURETTAGE OF UTERUS     1997   IR IMAGING GUIDED PORT INSERTION  03/08/2018   LAPAROSCOPY N/A 03/27/2015   Procedure: DIAGNOSTIC LAPAROSCOPY ;  Surgeon: [name], MD;  Location: [facility];  Service: Gynecology;  Laterality: N/A;   LAPAROSCOPY N/A 02/24/2016   Procedure: LAPAROSCOPY DIAGNOSTIC WITH PERITONEAL WASHINGS AND PERITONEAL BIOPSY;  Surgeon: [name], MD;  Location: [facility];  Service: Gynecology;  Laterality: N/A;   LAPAROTOMY N/A 03/27/2015   Procedure: EXPLORATORY LAPAROTOMY, BILATERAL SALPINGO OOPHORECTOMY, OMENTECTOMY, RADICAL TUMOR DEBULKING;  Surgeon: [name], MD;  Location: [facility];  Service: Gynecology;  Laterality: N/A;   sinus surgery 1994       FAMILY HISTORY Family History  Problem Relation Age of Onset   Hypertension Father    Skin cancer Father        nonmelanoma skin cancers in his late 41s   Non-Hodgkin's lymphoma Maternal Aunt        dx. 65s; smoker   Stroke Maternal Grandmother    Other Mother        benign meningioma dx. early-mid-70s; hysterectomy in her late 60s for heavy periods - still has ovaries   Other Son        one son with pre-cancerous skin findings   Other Daughter        cysts on ovaries and hx of heavy periods   Other Sister        hysterectomy for cysts - still has ovaries   Heart attack Maternal Grandfather    Heart attack Paternal Grandfather    Renal cancer Maternal Aunt 60       smoker   Breast cancer Maternal Aunt 78   Other Maternal Aunt        dx. benign brain tumor (meningioma) at 37; 2nd benign brain tumor in her late 51s; hx of radical hysterectomy at age 16   Brain cancer Other        NOS tumor  The patient's parents are both living, in their late 43s as of January 2018. The patient has one brother, 3 sisters. There is no history of ovarian cancer in the family. One maternal aunt had breast and kidney cancer, another had non-Hodgkin's lymphoma.   GYNECOLOGIC HISTORY:  No LMP recorded. Patient has had a hysterectomy. 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 Her husband [name] is a Engineer, maintenance (IT). Their children are 12, 76, 41 and 59 y/o as of JAN 2018. The oldest works as an Electrical engineer in the [employer] in [place]. The other 3 children live in [place], the youngest currently attending [employer]. The patient has no grandchildren. She attends [employer]   ADVANCED DIRECTIVES:    HEALTH MAINTENANCE: Social History   Tobacco Use   Smoking status: Never Smoker   Smokeless tobacco: Never Used  Substance Use Topics   Alcohol use: Yes    Comment:  3  glasses wine or beer/week   Drug use: No    Colonoscopy:2016  PAP: s/p hyst  Bone density:   Allergies  Allergen Reactions   Bee Venom Shortness Of Breath    swelling   Compazine [Prochlorperazine Edisylate] Anaphylaxis, Anxiety and Palpitations   Sulfa Antibiotics Shortness Of Breath    Vomit , diarrhea , hives   Zarxio [Filgrastim]     Current Outpatient Medications  Medication Sig Dispense Refill   diphenhydrAMINE (BENADRYL) 50 MG capsule 50 mg Diphenhydramine (Benadryl) PO within 1 hour of the injection 1 capsule 0   fluticasone (FLONASE) 50 MCG/ACT nasal spray Place 1 spray into both nostrils 2 (two) times daily. (Patient taking differently: Place 1 spray into both nostrils as needed. ) 16 g 2   gabapentin (NEURONTIN) 100 MG capsule Take 2 capsules (200 mg total) by mouth 3 (three) times daily. 540 capsule 1   lidocaine-prilocaine (EMLA) cream APPLY TOPICALLY AS NEEDED. 30 g 0   loratadine (CLARITIN) 10 MG tablet Take 10 mg by mouth as needed.      magnesium oxide (MAG-OX) 400 (241.3 Mg) MG tablet Take 1 tablet (400 mg total) by mouth daily. 60 tablet 3   omeprazole (PRILOSEC) 40 MG capsule Take 1 capsule (40 mg total) by mouth daily. 30 capsule 5   prochlorperazine (COMPAZINE) 10 MG tablet Take 1 tablet (10 mg total) by mouth every 6 (six) hours as needed (Nausea or vomiting). 30 tablet 1   sertraline (ZOLOFT) 50 MG tablet TAKE 1 TABLET ONCE DAILY. 30 tablet 0   traMADol (ULTRAM) 50 MG tablet Take 1 tablet (50 mg total) by mouth every 6 (six) hours as needed. 30 tablet 0   valACYclovir (VALTREX) 1000 MG tablet Take 1 tablet (1,000 mg total) by mouth daily. 5 tablet 0   No current facility-administered medications for this visit.    Facility-Administered Medications Ordered in Other Visits  Medication Dose Route Frequency Provider Last Rate Last Dose   heparin lock flush 100 unit/mL  500 Units Intravenous Once Risha Barretta, Virgie Dad, MD       sodium chloride  flush (NS) 0.9 % injection 10 mL  10 mL Intravenous PRN Sabriah Hobbins, Virgie Dad, MD        OBJECTIVE: Middle-aged white woman appears stated age  Vitals:   08/07/18 1212  BP: 118/63  Pulse: 74  Resp: 18  SpO2: 100%     Body mass index is 22.99 kg/m.  ECOG FS:1 - Symptomatic but completely ambulatory Sclerae unicteric, EOMs intact No cervical or supraclavicular adenopathy Lungs no rales or rhonchi Heart regular rate and rhythm Abd soft, nontender, positive bowel sounds, no port site recurrence palpated MSK no focal spinal tenderness, no upper extremity lymphedema Neuro: nonfocal, well oriented, appropriate affect Breasts: Deferred  LAB RESULTS:  CMP     Component Value Date/Time   NA 138 07/24/2018 0855   NA 140 02/11/2017 0755   K 4.7 07/24/2018 0855   K 4.4 02/11/2017 0755   CL 107 07/24/2018 0855   CO2 21 (L) 07/24/2018 0855   CO2 26 02/11/2017 0755   GLUCOSE 88 07/24/2018 0855   GLUCOSE 93 02/11/2017 0755   BUN 20 07/24/2018 0855   BUN 7.9 02/11/2017 0755   CREATININE 0.99 07/24/2018 0855   CREATININE 1.06 (H) 06/19/2018 0931   CREATININE 0.7 02/11/2017 0755   CALCIUM 9.0 07/24/2018 0855   CALCIUM 9.6 02/11/2017 0755   PROT 7.3 07/24/2018 0855   PROT 7.5 02/11/2017 0755   ALBUMIN 4.3 07/24/2018 0855   ALBUMIN 4.4 02/11/2017 0755   AST 39 07/24/2018 0855   AST 22 06/19/2018 0931   AST 27 02/11/2017 0755   ALT 44 07/24/2018 0855   ALT 13 06/19/2018 0931   ALT 24 02/11/2017 0755   ALKPHOS 96 07/24/2018 0855   ALKPHOS 58 02/11/2017 0755   BILITOT 0.5 07/24/2018 0855   BILITOT 0.2 (L) 06/19/2018 0931   BILITOT 0.43 02/11/2017 0755   GFRNONAA >60 07/24/2018 0855   GFRNONAA 59 (L) 06/19/2018 0931   GFRAA >60 07/24/2018 0855   GFRAA >60 06/19/2018 0931    INo results found for: SPEP, UPEP  Lab Results  Component Value Date   WBC 3.6 (L) 08/07/2018   NEUTROABS 2.0 08/07/2018   HGB 8.2 (L) 08/07/2018   HCT 26.1 (L) 08/07/2018   MCV 110.6 (H) 08/07/2018    PLT 170 08/07/2018      Chemistry      Component Value Date/Time   NA 138 07/24/2018 0855   NA 140 02/11/2017 0755   K 4.7 07/24/2018 0855   K 4.4 02/11/2017 0755   CL 107 07/24/2018 0855   CO2 21 (L) 07/24/2018 0855   CO2 26 02/11/2017 0755   BUN 20 07/24/2018 0855   BUN 7.9 02/11/2017 0755   CREATININE 0.99 07/24/2018 0855   CREATININE 1.06 (H) 06/19/2018 0931   CREATININE 0.7 02/11/2017 0755      Component Value Date/Time   CALCIUM 9.0 07/24/2018 0855   CALCIUM 9.6 02/11/2017 0755   ALKPHOS 96 07/24/2018 0855   ALKPHOS 58 02/11/2017 0755   AST 39 07/24/2018 0855   AST 22 06/19/2018 0931   AST 27 02/11/2017 0755   ALT 44 07/24/2018 0855   ALT 13 06/19/2018 0931   ALT 24 02/11/2017 0755   BILITOT 0.5 07/24/2018 0855   BILITOT 0.2 (L) 06/19/2018 0931   BILITOT 0.43 02/11/2017 0755       No results found for: LABCA2  No components found for: LABCA125  No results for input(s): INR in the last 168 hours.  Urinalysis    Component Value Date/Time   COLORURINE STRAW (A) 11/22/2017 0940   APPEARANCEUR CLEAR 11/22/2017 0940   LABSPEC 1.002 (L) 11/22/2017 0940   LABSPEC 1.005 10/09/2015 1239   PHURINE 7.0 11/22/2017 0940   GLUCOSEU NEGATIVE 11/22/2017 0940   GLUCOSEU Negative 10/09/2015 1239   HGBUR NEGATIVE 11/22/2017 0940   BILIRUBINUR NEGATIVE 11/22/2017 0940  BILIRUBINUR Negative 10/09/2015 1239   KETONESUR NEGATIVE 11/22/2017 0940   PROTEINUR NEGATIVE 11/22/2017 0940   UROBILINOGEN 0.2 10/09/2015 1239   NITRITE NEGATIVE 11/22/2017 0940   LEUKOCYTESUR NEGATIVE 11/22/2017 0940   LEUKOCYTESUR Negative 10/09/2015 1239    STUDIES: Mm 3d Screen Breast Bilateral  Result Date: 08/02/2018 CLINICAL DATA:  Screening. EXAM: DIGITAL SCREENING BILATERAL MAMMOGRAM WITH TOMO AND CAD COMPARISON:  Previous exam(s). ACR Breast Density Category c: The breast tissue is heterogeneously dense, which may obscure small masses. FINDINGS: There are no findings suspicious for  malignancy. Images were processed with CAD. IMPRESSION: No mammographic evidence of malignancy. A result letter of this screening mammogram will be mailed directly to the patient. RECOMMENDATION: Screening mammogram in one year. (Code:SM-B-01Y) BI-RADS CATEGORY  1: Negative. Electronically Signed   By: Ammie Ferrier M.D.   On: 08/02/2018 16:45    ELIGIBLE FOR AVAILABLE RESEARCH PROTOCOL:  no  ASSESSMENT: 53 y.o. BRCA negative [place] woman status post radical tumor debulking with optimal cytoreduction (R0) 03/27/2015 for a stage IIIC, high-grade right fallopian tube carcinoma  (a) baseline CA-125 was 1226.  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)  (c) tumor is PD-L1 negative (02/24/2016)  (d) tumor is strongly estrogen receptor positive, progesterone receptor negative (02/24/2016)  (1) adjuvant chemotherapy consisted of carboplatin and paclitaxel for 6 doses, begun 04/14/2015, completed 08/11/2015  (a) paclitaxel was omitted from cycle 5 and dose reduced on cycle 6 because of neuropathy   (b) a "make up" dose of paclitaxel was given 08/25/2015  (c) last carboplatin dose was 08/11/2015  (d) CA-125 normalized by 06/16/2015   (2) FIRST RECURRENCE: January 2018  (a) CA-125 rise beginning November 2017 led to CT scans and PET scans December 2017, all negative  (b) exploratory laparotomy 02/24/2016 showed pathologically confirmed recurrence, with miliary disease involving all examined surfaces  (c) port-site metastases noted 03/08/2016  (d) the January 2018 tumor sample was tested for the estrogen receptor and he was 80 percent positive with strong staining, progesterone receptor negative  (3) carboplatin/liposomal doxorubicin started 03/05/2016, repeated every three weeks x4, completed 06/04/2016.  (a) cycle 3 delayed  one week because of neutropenia; OnPro added  (b) CA 125 normalized after cycle 3  (4) RUCAPARIB maintenance started 07/02/2016  (a) restaging 10/01/2016: Normal CA 125, negative CT of the abdomen and pelvis  (b) labs 11/24/2016 shows a rise in her CA 125-43.4, with continuing rise thereafter  (c) rucaparib discontinued October 2018 with rising CA 125  (5) anastrozole started December 23, 2016-stopped after a couple of weeks with poor tolerance  SECOND RECURRENCE: (6) Gemcitabine/Bevacizumab started on 02/04/2017  (a) gemcitabine omitted cycle 4 because of intercurrent infection  (b) gemcitabine resumed with cycle 5, with intervening rise in  Ca 125  (c) repeat CT scan of the abdomen and pelvis on 05/26/2017 does not confirm obvious disease progression  (d) cycle 5 of gemcitabine/bevacizumab delayed 1 week   (e) gemcitabine/ bevacizumab discontinued after 6 cycles, with rising CA 125 (last dose 06/24/2017)  (7) foundation one testing did not show a high mutation burden and the microsatellite status was stable.  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  These were not immediately actionable  (8) Abraxane started 07/12/2017, given weekly or weeks 1 and 2 of each 3 weeks cycle, last dose 11/15/2017 (5 cycles)  (a) cycle 2 started 08/09/2017,  will be day 1 day 8 only  (b) cycle 3 scheduled to start 09/13/2017, day 1 day 8 at patient request  (c) CT of the abdomen and pelvis 11/28/2017 shows no measurable disease  (d) PET scan 01/25/2018 and MRI of the pelvis 02/03/2018 show no measurable disease  (e) patient has symptomatic ascites and a rapidly rising Ca1 25  (9) started cisplatin/gemcitabine days 1 and 8 of each 21-day cycle on 02/18/2018  (a) switched to carboplatin/ gemcitabine 07/17/2018 due to neuropathy  (b) carbo/gemzar changed to Q14 days 07/24/2018 due to neutropenia  (10) associated problems  (a) anemia due to chemotherapy: On Aranesp  (b) 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  She is going to receive [place] and Gemzar today with OnPro and her next dose will be again in 2 weeks.  Since we are doing 2-week treatments I am switching her Aranesp to every 2 weeks as well.  She will receive a dose today.  Her most recent ferritin was 192.  We will repeat that at the next visit.  I am hopeful her peripheral neuropathy begins to settle down now that she is off cis-platinum.  I explained to her how to titrate up her gabapentin as needed at bedtime.  She is now taking it 2 hours prior to bedtime and that seems to be helping.  She is already looking forward to the time when she will stop the chemo, which probably will be in a couple of months.  At that point we will go on a different part inhibitor.  It is very favorable that the "catch" which was constantly bothering her in the right upper quadrant appears to have resolved  She knows to call for any other issue that may develop before the next visit.   Avin Upperman, Virgie Dad, MD  08/07/18 12:39 PM Medical Oncology and Hematology [facility] [address] [place], [place] [address] Tel. [phone]    Fax. [phone]   I, Jacqualyn Posey am acting as a Education administrator for Chauncey Cruel, MD.   I, Lurline Del MD, have reviewed the above documentation for accuracy and completeness, and I agree with the above.

## 2018-08-07 ENCOUNTER — Inpatient Hospital Stay (HOSPITAL_BASED_OUTPATIENT_CLINIC_OR_DEPARTMENT_OTHER): Payer: 59 | Admitting: Oncology

## 2018-08-07 ENCOUNTER — Inpatient Hospital Stay: Payer: 59

## 2018-08-07 ENCOUNTER — Other Ambulatory Visit: Payer: Self-pay

## 2018-08-07 VITALS — BP 118/63 | HR 74 | Resp 18 | Ht 63.0 in | Wt 129.8 lb

## 2018-08-07 DIAGNOSIS — D701 Agranulocytosis secondary to cancer chemotherapy: Secondary | ICD-10-CM | POA: Diagnosis not present

## 2018-08-07 DIAGNOSIS — C5701 Malignant neoplasm of right fallopian tube: Secondary | ICD-10-CM | POA: Diagnosis not present

## 2018-08-07 DIAGNOSIS — Z79899 Other long term (current) drug therapy: Secondary | ICD-10-CM

## 2018-08-07 DIAGNOSIS — D702 Other drug-induced agranulocytosis: Secondary | ICD-10-CM

## 2018-08-07 DIAGNOSIS — Z95828 Presence of other vascular implants and grafts: Secondary | ICD-10-CM

## 2018-08-07 DIAGNOSIS — G620 Drug-induced polyneuropathy: Secondary | ICD-10-CM

## 2018-08-07 DIAGNOSIS — C762 Malignant neoplasm of abdomen: Secondary | ICD-10-CM

## 2018-08-07 DIAGNOSIS — C786 Secondary malignant neoplasm of retroperitoneum and peritoneum: Secondary | ICD-10-CM

## 2018-08-07 DIAGNOSIS — T451X5A Adverse effect of antineoplastic and immunosuppressive drugs, initial encounter: Secondary | ICD-10-CM

## 2018-08-07 DIAGNOSIS — Z5111 Encounter for antineoplastic chemotherapy: Secondary | ICD-10-CM | POA: Diagnosis not present

## 2018-08-07 DIAGNOSIS — Z7189 Other specified counseling: Secondary | ICD-10-CM

## 2018-08-07 DIAGNOSIS — R188 Other ascites: Secondary | ICD-10-CM

## 2018-08-07 DIAGNOSIS — D6481 Anemia due to antineoplastic chemotherapy: Secondary | ICD-10-CM

## 2018-08-07 DIAGNOSIS — Z7951 Long term (current) use of inhaled steroids: Secondary | ICD-10-CM

## 2018-08-07 LAB — CBC WITH DIFFERENTIAL/PLATELET
Abs Immature Granulocytes: 0.02 10*3/uL (ref 0.00–0.07)
Basophils Absolute: 0 10*3/uL (ref 0.0–0.1)
Basophils Relative: 0 %
Eosinophils Absolute: 0 10*3/uL (ref 0.0–0.5)
Eosinophils Relative: 0 %
HCT: 26.1 % — ABNORMAL LOW (ref 36.0–46.0)
Hemoglobin: 8.2 g/dL — ABNORMAL LOW (ref 12.0–15.0)
Immature Granulocytes: 1 %
Lymphocytes Relative: 32 %
Lymphs Abs: 1.2 10*3/uL (ref 0.7–4.0)
MCH: 34.7 pg — ABNORMAL HIGH (ref 26.0–34.0)
MCHC: 31.4 g/dL (ref 30.0–36.0)
MCV: 110.6 fL — ABNORMAL HIGH (ref 80.0–100.0)
Monocytes Absolute: 0.4 10*3/uL (ref 0.1–1.0)
Monocytes Relative: 12 %
Neutro Abs: 2 10*3/uL (ref 1.7–7.7)
Neutrophils Relative %: 55 %
Platelets: 170 10*3/uL (ref 150–400)
RBC: 2.36 MIL/uL — ABNORMAL LOW (ref 3.87–5.11)
RDW: 15.6 % — ABNORMAL HIGH (ref 11.5–15.5)
WBC: 3.6 10*3/uL — ABNORMAL LOW (ref 4.0–10.5)
nRBC: 0 % (ref 0.0–0.2)

## 2018-08-07 LAB — COMPREHENSIVE METABOLIC PANEL
ALT: 24 U/L (ref 0–44)
AST: 24 U/L (ref 15–41)
Albumin: 4.3 g/dL (ref 3.5–5.0)
Alkaline Phosphatase: 95 U/L (ref 38–126)
Anion gap: 9 (ref 5–15)
BUN: 23 mg/dL — ABNORMAL HIGH (ref 6–20)
CO2: 21 mmol/L — ABNORMAL LOW (ref 22–32)
Calcium: 8.9 mg/dL (ref 8.9–10.3)
Chloride: 109 mmol/L (ref 98–111)
Creatinine, Ser: 1.18 mg/dL — ABNORMAL HIGH (ref 0.44–1.00)
GFR calc Af Amer: >60 mL/min (ref >60)
GFR calc non Af Amer: 52 mL/min — ABNORMAL LOW (ref >60)
Glucose, Bld: 96 mg/dL (ref 70–99)
Potassium: 4.6 mmol/L (ref 3.5–5.1)
Sodium: 139 mmol/L (ref 135–145)
Total Bilirubin: 0.3 mg/dL (ref 0.3–1.2)
Total Protein: 7.4 g/dL (ref 6.5–8.1)

## 2018-08-07 MED ORDER — FAMOTIDINE IN NACL 20-0.9 MG/50ML-% IV SOLN
20.0000 mg | Freq: Once | INTRAVENOUS | Status: AC
  Administered 2018-08-07: 20 mg via INTRAVENOUS

## 2018-08-07 MED ORDER — DARBEPOETIN ALFA 300 MCG/0.6ML IJ SOSY
300.0000 ug | PREFILLED_SYRINGE | Freq: Once | INTRAMUSCULAR | Status: AC
  Administered 2018-08-07: 300 ug via SUBCUTANEOUS

## 2018-08-07 MED ORDER — SODIUM CHLORIDE 0.9 % IV SOLN
148.2000 mg | Freq: Once | INTRAVENOUS | Status: AC
  Administered 2018-08-07: 150 mg via INTRAVENOUS
  Filled 2018-08-07: qty 15

## 2018-08-07 MED ORDER — PALONOSETRON HCL INJECTION 0.25 MG/5ML
0.2500 mg | Freq: Once | INTRAVENOUS | Status: AC
  Administered 2018-08-07: 0.25 mg via INTRAVENOUS

## 2018-08-07 MED ORDER — DIPHENHYDRAMINE HCL 50 MG/ML IJ SOLN
INTRAMUSCULAR | Status: AC
  Filled 2018-08-07: qty 1

## 2018-08-07 MED ORDER — SODIUM CHLORIDE 0.9% FLUSH
10.0000 mL | INTRAVENOUS | Status: DC | PRN
  Administered 2018-08-07: 10 mL
  Filled 2018-08-07: qty 10

## 2018-08-07 MED ORDER — DEXAMETHASONE SODIUM PHOSPHATE 10 MG/ML IJ SOLN
INTRAMUSCULAR | Status: AC
  Filled 2018-08-07: qty 1

## 2018-08-07 MED ORDER — PALONOSETRON HCL INJECTION 0.25 MG/5ML
INTRAVENOUS | Status: AC
  Filled 2018-08-07: qty 5

## 2018-08-07 MED ORDER — SODIUM CHLORIDE 0.9 % IV SOLN
1254.0000 mg | Freq: Once | INTRAVENOUS | Status: AC
  Administered 2018-08-07: 1254 mg via INTRAVENOUS
  Filled 2018-08-07: qty 32.98

## 2018-08-07 MED ORDER — DARBEPOETIN ALFA 300 MCG/0.6ML IJ SOSY
PREFILLED_SYRINGE | INTRAMUSCULAR | Status: AC
  Filled 2018-08-07: qty 0.6

## 2018-08-07 MED ORDER — DEXAMETHASONE SODIUM PHOSPHATE 10 MG/ML IJ SOLN
10.0000 mg | Freq: Once | INTRAMUSCULAR | Status: AC
  Administered 2018-08-07: 10 mg via INTRAVENOUS

## 2018-08-07 MED ORDER — PEGFILGRASTIM 6 MG/0.6ML ~~LOC~~ PSKT
6.0000 mg | PREFILLED_SYRINGE | Freq: Once | SUBCUTANEOUS | Status: AC
  Administered 2018-08-07: 6 mg via SUBCUTANEOUS

## 2018-08-07 MED ORDER — FAMOTIDINE IN NACL 20-0.9 MG/50ML-% IV SOLN
INTRAVENOUS | Status: AC
  Filled 2018-08-07: qty 50

## 2018-08-07 MED ORDER — PEGFILGRASTIM 6 MG/0.6ML ~~LOC~~ PSKT
PREFILLED_SYRINGE | SUBCUTANEOUS | Status: AC
  Filled 2018-08-07: qty 0.6

## 2018-08-07 MED ORDER — DIPHENHYDRAMINE HCL 50 MG/ML IJ SOLN
25.0000 mg | Freq: Once | INTRAMUSCULAR | Status: AC
  Administered 2018-08-07: 25 mg via INTRAVENOUS

## 2018-08-07 MED ORDER — SODIUM CHLORIDE 0.9% FLUSH
10.0000 mL | INTRAVENOUS | Status: DC | PRN
  Administered 2018-08-07: 12:00:00 10 mL
  Filled 2018-08-07: qty 10

## 2018-08-07 MED ORDER — SODIUM CHLORIDE 0.9 % IV SOLN
Freq: Once | INTRAVENOUS | Status: AC
  Administered 2018-08-07: 14:00:00 via INTRAVENOUS
  Filled 2018-08-07: qty 250

## 2018-08-07 MED ORDER — HEPARIN SOD (PORK) LOCK FLUSH 100 UNIT/ML IV SOLN
500.0000 [IU] | Freq: Once | INTRAVENOUS | Status: DC | PRN
  Filled 2018-08-07: qty 5

## 2018-08-07 MED ORDER — HEPARIN SOD (PORK) LOCK FLUSH 100 UNIT/ML IV SOLN
500.0000 [IU] | Freq: Once | INTRAVENOUS | Status: AC | PRN
  Administered 2018-08-07: 500 [IU]
  Filled 2018-08-07: qty 5

## 2018-08-07 MED ORDER — ALTEPLASE 2 MG IJ SOLR
2.0000 mg | Freq: Once | INTRAMUSCULAR | Status: DC | PRN
  Filled 2018-08-07: qty 2

## 2018-08-07 NOTE — Patient Instructions (Signed)
[facility] Discharge Instructions for Patients Receiving Chemotherapy  Today you received the following chemotherapy agents: Gemzar/Carboplatin.  To help prevent nausea and vomiting after your treatment, we encourage you to take your nausea medication as directed.   If you develop nausea and vomiting that is not controlled by your nausea medication, call the clinic.   BELOW ARE SYMPTOMS THAT SHOULD BE REPORTED IMMEDIATELY:  *FEVER GREATER THAN 100.5 F  *CHILLS WITH OR WITHOUT FEVER  NAUSEA AND VOMITING THAT IS NOT CONTROLLED WITH YOUR NAUSEA MEDICATION  *UNUSUAL SHORTNESS OF BREATH  *UNUSUAL BRUISING OR BLEEDING  TENDERNESS IN MOUTH AND THROAT WITH OR WITHOUT PRESENCE OF ULCERS  *URINARY PROBLEMS  *BOWEL PROBLEMS  UNUSUAL RASH Items with * indicate a potential emergency and should be followed up as soon as possible.  Feel free to call the clinic should you have any questions or concerns. The clinic phone number is [phone].  Please show the CHEMO ALERT CARD at check-in to the Emergency Department and triage nurse.   

## 2018-08-07 NOTE — Addendum Note (Signed)
Addended by: Chauncey Cruel on: 08/07/2018 03:39 PM   Modules accepted: Orders

## 2018-08-10 ENCOUNTER — Telehealth: Payer: Self-pay | Admitting: Oncology

## 2018-08-10 NOTE — Telephone Encounter (Signed)
I could not reach mailbox was full

## 2018-08-20 NOTE — Progress Notes (Signed)
[place]  [phone] Fax:(336) [phone]    ID: [name] DOB: 1965/11/26  MR#: [id number]  SUP#:[id number]  Patient Care Team: Harlan Stains, MD as PCP - General (Family Medicine) [name], MD as Consulting Physician (Obstetrics and Gynecology) Juanita Craver, MD as Consulting Physician (Gastroenterology) Servando Salina, MD as Consulting Physician (Obstetrics and Gynecology) Nickie Retort, MD as Consulting Physician (Urology) Gillis Ends, MD as Referring Physician (Obstetrics and Gynecology) OTHER MD: Festus Aloe [phone]) Floyde Parkins   CHIEF COMPLAINT:  ovarian cancer  CURRENT TREATMENT: carboplatin, gemcitabine; Aranesp; bevacizumab   INTERVAL HISTORY: Modelle returns today for follow-up and treatment of her recurrent ovarian cancer.  She continues on chemotherapy currently consisting of carboplatin and gemcitabinegiven every 14 days, with onpro support. Today is day 1 cycle 3. The pain in her right upper quadrant has not completely resolved, but it feels much better than it did previously. The bloated feeling she felt before has resolved. For her constipation, she usually treats with medicine, but this last time she believes that she ate something that helped clear her out. She notes some pain in her feet due to neuropathy. She does have some boney aches from the OnPro which she treats with Claritin.   She also continues on Aranesp every 2 weeks, with her most recent dose received on 08/07/2018.    Since her last visit here, she has not undergone any additional studies.    Results for VERIA, STRADLEY (MRN [id number]) as of 08/20/2018 16:35  Ref. Range 05/29/2018 09:40 06/19/2018 09:31 07/03/2018 08:53 07/13/2018 09:09 07/24/2018 08:55  Cancer Antigen (CA) 125 Latest Ref Range: 0.0 - 38.1 U/mL 113.0 (H) 116.0 (H) 116.0 (H) 99.0 (H) 115.0 (H)     REVIEW OF SYSTEMS: Niyonna notes that the heat is really hard on her. She did  get out to walk some, but she is only getting about a mile in due to the heat. She does also complain of restless leg at night. The patient denies unusual headaches, visual changes, nausea, vomiting, or dizziness. There has been no unusual cough, phlegm production, or pleurisy. This been no change in bladder habits. The patient denies unexplained fatigue or unexplained weight loss, bleeding, rash, or fever. A detailed review of systems was otherwise noncontributory.    HISTORY OF PRESENT ILLNESS: From Dr. Serita Grit original intake note 03/17/2015:  "Salley Boxley is a very pleasant G4P4 who is seen in consultation at the request of Dr Collene Mares and Dr Garwin Brothers for peritoneal carcinomatosis. The patient has a history of a workup by urology for gross hematuria which included a pelvic examine was suggested for extrinsic mass effect on the bladder on cystoscopy. Cystoscopy took place on in December 2016. The patient denies abdominal pain bloating early satiety or abdominal distention.  On 08/03/2015 at [facility] she underwent a CT scan of the abdomen and pelvis as ordered by Dr Baruch Gouty. This revealed a 1.4 cm low attenuation lesion on the posterior right hepatic lobe suggestive of a hemangioma. Status post hysterectomy. No ovarian masses. Moderate ascites. Omental caking seen in the lateral left abdomen and pelvis. Peritoneal nodularity in the left lateral pelvic cul-de-sac. No gross extrinsic compression on the bladder was identified on imaging.  The patient was then seen by her gastroenterologist, Dr. Collene Mares, who performed a colonoscopy which was unremarkable.  Tumor markers were drawn on 08/04/2015 and these included a CA-125 that was elevated to 957, and a CEA that was normal at 1."  On 03/27/2015  the patient underwent diagnostic laparoscopy, exploratory laparotomy with bilateral salpingo-oophorectomy, omentectomy, and radical tumor debulking with optimal cytoreduction (R0) of what proved to be a  stage IIIc right fallopian tube cancer. She was treated adjuvantly with carboplatin and paclitaxel, as detailed below. Her CA 125 dropped from 1226 on 03/20/2015 to 26.1 by 06/16/2015.  Her subsequent history is as detailed above.   PAST MEDICAL HISTORY: Past Medical History:  Diagnosis Date  . Acute sensory neuropathy 06/26/2015  . Allergy   . Anemia   . Asthma    illness induced asthma  . Blood transfusion without reported diagnosis    at age 53 years old D/T surgery to femur being crushed  . 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 Heart murmur    pt, states had a "working heart murmur"  . History of kidney stones   . Neutropenia, drug-induced ([place]) 03/10/2018  . PONV (postoperative nausea and vomiting)   . Skin cancer     PAST SURGICAL HISTORY: Past Surgical History:  Procedure Laterality Date  . 3 laporscopic proceedures    . ABDOMINAL HYSTERECTOMY     2010  . CHOLECYSTECTOMY     2010  . DILATION AND CURETTAGE OF UTERUS     1997  . IR IMAGING GUIDED PORT INSERTION  03/08/2018  . LAPAROSCOPY N/A 03/27/2015   Procedure: DIAGNOSTIC LAPAROSCOPY ;  Surgeon: [name], MD;  Location: [facility];  Service: Gynecology;  Laterality: N/A;  . LAPAROSCOPY N/A 02/24/2016   Procedure: LAPAROSCOPY DIAGNOSTIC WITH PERITONEAL WASHINGS AND PERITONEAL BIOPSY;  Surgeon: [name], MD;  Location: [facility];  Service: Gynecology;  Laterality: N/A;  . LAPAROTOMY N/A 03/27/2015   Procedure: EXPLORATORY LAPAROTOMY, BILATERAL SALPINGO OOPHORECTOMY, OMENTECTOMY, RADICAL TUMOR DEBULKING;  Surgeon: [name], MD;  Location: [facility];  Service: Gynecology;  Laterality: N/A;  . sinus surgery 1994      FAMILY HISTORY Family History  Problem Relation Age of Onset  . Hypertension Father   . Skin cancer Father        nonmelanoma skin cancers in his late 58s  . Non-Hodgkin's lymphoma Maternal Aunt        dx. 43s; smoker  . Stroke  Maternal Grandmother   . Other Mother        benign meningioma dx. early-mid-70s; hysterectomy in her late 42s for heavy periods - still has ovaries  . Other Son        one son with pre-cancerous skin findings  . Other Daughter        cysts on ovaries and hx of heavy periods  . Other Sister        hysterectomy for cysts - still has ovaries  . Heart attack Maternal Grandfather   . Heart attack Paternal Grandfather   . Renal cancer Maternal Aunt 60       smoker  . Breast cancer Maternal Aunt 78  . Other Maternal Aunt        dx. benign brain tumor (meningioma) at 32; 2nd benign brain tumor in her late 72s; hx of radical hysterectomy at age 71  . Brain cancer Other        NOS tumor  The patient's parents are both living, in their late 45s as of January 2018. The patient has one brother, 3 sisters. There is no history of ovarian cancer in the family. One maternal aunt had breast and kidney cancer,  another had non-Hodgkin's lymphoma.   GYNECOLOGIC HISTORY:  No LMP recorded. Patient has had a hysterectomy. 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 Her husband [name] is a Engineer, maintenance (IT). Their children are 39, 47, 60 and 63 y/o as of JAN 2018. The oldest works as an Electrical engineer in the [employer] in [place]. The other 3 children live in [place], the youngest currently attending [employer]. The patient has no grandchildren. She attends [employer]   ADVANCED DIRECTIVES:    HEALTH MAINTENANCE: Social History   Tobacco Use  . Smoking status: Never Smoker  . Smokeless tobacco: Never Used  Substance Use Topics  . Alcohol use: Yes    Comment:  3 glasses wine or beer/week  . Drug use: No    Colonoscopy:2016  PAP: s/p hyst  Bone density:   Allergies  Allergen Reactions  . Bee Venom Shortness Of Breath    swelling  . Compazine [Prochlorperazine Edisylate] Anaphylaxis, Anxiety  and Palpitations  . Sulfa Antibiotics Shortness Of Breath    Vomit , diarrhea , hives  . Zarxio [Filgrastim]     Current Outpatient Medications  Medication Sig Dispense Refill  . diphenhydrAMINE (BENADRYL) 50 MG capsule 50 mg Diphenhydramine (Benadryl) PO within 1 hour of the injection 1 capsule 0  . fluticasone (FLONASE) 50 MCG/ACT nasal spray Place 1 spray into both nostrils 2 (two) times daily. (Patient taking differently: Place 1 spray into both nostrils as needed. ) 16 g 2  . gabapentin (NEURONTIN) 100 MG capsule Take 2 capsules (200 mg total) by mouth 3 (three) times daily. 540 capsule 1  . lidocaine-prilocaine (EMLA) cream APPLY TOPICALLY AS NEEDED. 30 g 0  . loratadine (CLARITIN) 10 MG tablet Take 10 mg by mouth as needed.     . magnesium oxide (MAG-OX) 400 (241.3 Mg) MG tablet Take 1 tablet (400 mg total) by mouth daily. 60 tablet 3  . omeprazole (PRILOSEC) 40 MG capsule Take 1 capsule (40 mg total) by mouth daily. 30 capsule 5  . prochlorperazine (COMPAZINE) 10 MG tablet Take 1 tablet (10 mg total) by mouth every 6 (six) hours as needed (Nausea or vomiting). 30 tablet 1  . sertraline (ZOLOFT) 50 MG tablet TAKE 1 TABLET ONCE DAILY. 30 tablet 0  . traMADol (ULTRAM) 50 MG tablet Take 1 tablet (50 mg total) by mouth every 6 (six) hours as needed. 30 tablet 0  . valACYclovir (VALTREX) 1000 MG tablet Take 1 tablet (1,000 mg total) by mouth daily. 5 tablet 0   No current facility-administered medications for this visit.    Facility-Administered Medications Ordered in Other Visits  Medication Dose Route Frequency Provider Last Rate Last Dose  . heparin lock flush 100 unit/mL  500 Units Intravenous Once Magrinat, Virgie Dad, MD      . sodium chloride flush (NS) 0.9 % injection 10 mL  10 mL Intravenous PRN Magrinat, Virgie Dad, MD        OBJECTIVE: Middle-aged white woman in no acute distress  Vitals:   08/21/18 0809  BP: 118/67  Pulse: 68  Resp: 20  Temp: 98.5 F (36.9 C)  SpO2: 99%      Body mass index is 23.37 kg/m.   ECOG FS:1 - Symptomatic but completely ambulatory  Sclerae unicteric, pupils round and equal Wearing a mask No cervical or supraclavicular adenopathy Lungs no rales or rhonchi Heart regular rate and  rhythm Abd soft, nontender, positive bowel sounds, no evidence of port site metastasis recurrence MSK no focal spinal tenderness, no upper extremity lymphedema Neuro: nonfocal, well oriented, appropriate affect Breasts: Deferred  LAB RESULTS:  CMP     Component Value Date/Time   NA 139 08/07/2018 1203   NA 140 02/11/2017 0755   K 4.6 08/07/2018 1203   K 4.4 02/11/2017 0755   CL 109 08/07/2018 1203   CO2 21 (L) 08/07/2018 1203   CO2 26 02/11/2017 0755   GLUCOSE 96 08/07/2018 1203   GLUCOSE 93 02/11/2017 0755   BUN 23 (H) 08/07/2018 1203   BUN 7.9 02/11/2017 0755   CREATININE 1.18 (H) 08/07/2018 1203   CREATININE 1.06 (H) 06/19/2018 0931   CREATININE 0.7 02/11/2017 0755   CALCIUM 8.9 08/07/2018 1203   CALCIUM 9.6 02/11/2017 0755   PROT 7.4 08/07/2018 1203   PROT 7.5 02/11/2017 0755   ALBUMIN 4.3 08/07/2018 1203   ALBUMIN 4.4 02/11/2017 0755   AST 24 08/07/2018 1203   AST 22 06/19/2018 0931   AST 27 02/11/2017 0755   ALT 24 08/07/2018 1203   ALT 13 06/19/2018 0931   ALT 24 02/11/2017 0755   ALKPHOS 95 08/07/2018 1203   ALKPHOS 58 02/11/2017 0755   BILITOT 0.3 08/07/2018 1203   BILITOT 0.2 (L) 06/19/2018 0931   BILITOT 0.43 02/11/2017 0755   GFRNONAA 52 (L) 08/07/2018 1203   GFRNONAA 59 (L) 06/19/2018 0931   GFRAA >60 08/07/2018 1203   GFRAA >60 06/19/2018 0931    INo results found for: SPEP, UPEP  Lab Results  Component Value Date   WBC 3.6 (L) 08/07/2018   NEUTROABS 2.0 08/07/2018   HGB 8.2 (L) 08/07/2018   HCT 26.1 (L) 08/07/2018   MCV 110.6 (H) 08/07/2018   PLT 170 08/07/2018      Chemistry      Component Value Date/Time   NA 139 08/07/2018 1203   NA 140 02/11/2017 0755   K 4.6 08/07/2018 1203   K 4.4  02/11/2017 0755   CL 109 08/07/2018 1203   CO2 21 (L) 08/07/2018 1203   CO2 26 02/11/2017 0755   BUN 23 (H) 08/07/2018 1203   BUN 7.9 02/11/2017 0755   CREATININE 1.18 (H) 08/07/2018 1203   CREATININE 1.06 (H) 06/19/2018 0931   CREATININE 0.7 02/11/2017 0755      Component Value Date/Time   CALCIUM 8.9 08/07/2018 1203   CALCIUM 9.6 02/11/2017 0755   ALKPHOS 95 08/07/2018 1203   ALKPHOS 58 02/11/2017 0755   AST 24 08/07/2018 1203   AST 22 06/19/2018 0931   AST 27 02/11/2017 0755   ALT 24 08/07/2018 1203   ALT 13 06/19/2018 0931   ALT 24 02/11/2017 0755   BILITOT 0.3 08/07/2018 1203   BILITOT 0.2 (L) 06/19/2018 0931   BILITOT 0.43 02/11/2017 0755       No results found for: LABCA2  No components found for: LABCA125  No results for input(s): INR in the last 168 hours.  Urinalysis    Component Value Date/Time   COLORURINE STRAW (A) 11/22/2017 0940   APPEARANCEUR CLEAR 11/22/2017 0940   LABSPEC 1.002 (L) 11/22/2017 0940   LABSPEC 1.005 10/09/2015 1239   PHURINE 7.0 11/22/2017 0940   GLUCOSEU NEGATIVE 11/22/2017 0940   GLUCOSEU Negative 10/09/2015 1239   HGBUR NEGATIVE 11/22/2017 0940   BILIRUBINUR NEGATIVE 11/22/2017 0940   BILIRUBINUR Negative 10/09/2015 [place] 11/22/2017 0940   PROTEINUR NEGATIVE 11/22/2017 0940  UROBILINOGEN 0.2 10/09/2015 1239   NITRITE NEGATIVE 11/22/2017 0940   LEUKOCYTESUR NEGATIVE 11/22/2017 0940   LEUKOCYTESUR Negative 10/09/2015 1239    STUDIES: Mm 3d Screen Breast Bilateral  Result Date: 08/02/2018 CLINICAL DATA:  Screening. EXAM: DIGITAL SCREENING BILATERAL MAMMOGRAM WITH TOMO AND CAD COMPARISON:  Previous exam(s). ACR Breast Density Category c: The breast tissue is heterogeneously dense, which may obscure small masses. FINDINGS: There are no findings suspicious for malignancy. Images were processed with CAD. IMPRESSION: No mammographic evidence of malignancy. A result letter of this screening mammogram will be  mailed directly to the patient. RECOMMENDATION: Screening mammogram in one year. (Code:SM-B-01Y) BI-RADS CATEGORY  1: Negative. Electronically Signed   By: Ammie Ferrier M.D.   On: 08/02/2018 16:45    ELIGIBLE FOR AVAILABLE RESEARCH PROTOCOL:  no  ASSESSMENT: 53 y.o. BRCA negative [place] woman status post radical tumor debulking with optimal cytoreduction (R0) 03/27/2015 for a stage IIIC, high-grade right fallopian tube carcinoma  (a) baseline CA-125 was 1226.  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)  (c) tumor is PD-L1 negative (02/24/2016)  (d) tumor is strongly estrogen receptor positive, progesterone receptor negative (02/24/2016)  (1) adjuvant chemotherapy consisted of carboplatin and paclitaxel for 6 doses, begun 04/14/2015, completed 08/11/2015  (a) paclitaxel was omitted from cycle 5 and dose reduced on cycle 6 because of neuropathy   (b) a "make up" dose of paclitaxel was given 08/25/2015  (c) last carboplatin dose was 08/11/2015  (d) CA-125 normalized by 06/16/2015   (2) FIRST RECURRENCE: January 2018  (a) CA-125 rise beginning November 2017 led to CT scans and PET scans December 2017, all negative  (b) exploratory laparotomy 02/24/2016 showed pathologically confirmed recurrence, with miliary disease involving all examined surfaces  (c) port-site metastases noted 03/08/2016  (d) the January 2018 tumor sample was tested for the estrogen receptor and he was 80 percent positive with strong staining, progesterone receptor negative  (3) carboplatin/liposomal doxorubicin started 03/05/2016, repeated every three weeks x4, completed 06/04/2016.  (a) cycle 3 delayed one week because of neutropenia; OnPro added  (b) CA 125 normalized after cycle 3  (4) RUCAPARIB maintenance started 07/02/2016  (a) restaging  10/01/2016: Normal CA 125, negative CT of the abdomen and pelvis  (b) labs 11/24/2016 shows a rise in her CA 125-43.4, with continuing rise thereafter  (c) rucaparib discontinued October 2018 with rising CA 125  (5) anastrozole started December 23, 2016-stopped after a couple of weeks with poor tolerance  SECOND RECURRENCE: (6) Gemcitabine/Bevacizumab started on 02/04/2017  (a) gemcitabine omitted cycle 4 because of intercurrent infection  (b) gemcitabine resumed with cycle 5, with intervening rise in  Ca 125  (c) repeat CT scan of the abdomen and pelvis on 05/26/2017 does not confirm obvious disease progression  (d) cycle 5 of gemcitabine/bevacizumab delayed 1 week   (e) gemcitabine/ bevacizumab discontinued after 6 cycles, with rising CA 125 (last dose 06/24/2017)  (7) foundation one testing did not show a high mutation burden and the microsatellite status was stable.  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  These were not immediately actionable  (8) Abraxane started 07/12/2017, given weekly or weeks 1 and 2 of each 3 weeks cycle, last dose 11/15/2017 (5 cycles)  (a) cycle 2 started 08/09/2017, will be day 1 day 8 only  (b) cycle 3 scheduled to start 09/13/2017, day 1 day  8 at patient request  (c) CT of the abdomen and pelvis 11/28/2017 shows no measurable disease  (d) PET scan 01/25/2018 and MRI of the pelvis 02/03/2018 show no measurable disease  (e) patient has symptomatic ascites and a rapidly rising Ca1 25  (9) started cisplatin/gemcitabine days 1 and 8 of each 21-day cycle on 02/18/2018  (a) switched to carboplatin/ gemcitabine 07/17/2018 due to neuropathy  (b) carbo/gemzar changed to Q14 days 07/24/2018 due to neutropenia  (10) associated problems  (a) anemia due to chemotherapy: On Aranesp  (b) peripheral neuropathy due to chemotherapy: On gabapentin  (11) to start niraparib/Zejula 100 mg, 2 tabs daily at the completion of chemotherapy   PLAN: Quintavia is tolerating  the carboplatin/gemcitabine every 2 weeks well and she will receive a dose today.  The question is how many more treatments to do.  We are going to be following the Ca1 25 of course today and a month from now.  Barring any surprises, very likely after her July 31 dose we will obtain a PET scan and switch over to niraparib.  We discussed the fact that high doses of vitamins can interfere with chemotherapy.  She will make sure the vitamin supplement she is taking does not have megadoses of anything.  She is doing her best to ambulate but she does have neuropathy in her feet which is interfering with that.  The heat of course also does not help.  Otherwise she looks well and is doing terrific overall.  She will return in 2 weeks and see our nurse practitioner and then see me again in 4 weeks for a hopefully final dose of her current treatment.  She knows to call for any other issue that may develop before the next visit here.  Magrinat, Virgie Dad, MD  08/21/18 8:36 AM Medical Oncology and Hematology [facility] [address] [place], [place] [address] Tel. [phone]    Fax. [phone]   I, Jacqualyn Posey am acting as a Education administrator for Chauncey Cruel, MD.   I, Lurline Del MD, have reviewed the above documentation for accuracy and completeness, and I agree with the above.

## 2018-08-21 ENCOUNTER — Telehealth: Payer: Self-pay | Admitting: *Deleted

## 2018-08-21 ENCOUNTER — Inpatient Hospital Stay: Payer: 59 | Attending: Oncology

## 2018-08-21 ENCOUNTER — Other Ambulatory Visit: Payer: 59

## 2018-08-21 ENCOUNTER — Other Ambulatory Visit: Payer: Self-pay

## 2018-08-21 ENCOUNTER — Telehealth: Payer: Self-pay | Admitting: Oncology

## 2018-08-21 ENCOUNTER — Inpatient Hospital Stay: Payer: 59

## 2018-08-21 ENCOUNTER — Inpatient Hospital Stay (HOSPITAL_BASED_OUTPATIENT_CLINIC_OR_DEPARTMENT_OTHER): Payer: 59 | Admitting: Oncology

## 2018-08-21 ENCOUNTER — Ambulatory Visit: Payer: 59

## 2018-08-21 VITALS — BP 118/67 | HR 68 | Temp 98.5°F | Resp 20 | Ht 63.0 in | Wt 131.9 lb

## 2018-08-21 DIAGNOSIS — D6481 Anemia due to antineoplastic chemotherapy: Secondary | ICD-10-CM | POA: Insufficient documentation

## 2018-08-21 DIAGNOSIS — R188 Other ascites: Secondary | ICD-10-CM

## 2018-08-21 DIAGNOSIS — K219 Gastro-esophageal reflux disease without esophagitis: Secondary | ICD-10-CM | POA: Insufficient documentation

## 2018-08-21 DIAGNOSIS — D696 Thrombocytopenia, unspecified: Secondary | ICD-10-CM | POA: Insufficient documentation

## 2018-08-21 DIAGNOSIS — Z79899 Other long term (current) drug therapy: Secondary | ICD-10-CM | POA: Insufficient documentation

## 2018-08-21 DIAGNOSIS — C5701 Malignant neoplasm of right fallopian tube: Secondary | ICD-10-CM

## 2018-08-21 DIAGNOSIS — C762 Malignant neoplasm of abdomen: Secondary | ICD-10-CM

## 2018-08-21 DIAGNOSIS — G620 Drug-induced polyneuropathy: Secondary | ICD-10-CM | POA: Insufficient documentation

## 2018-08-21 DIAGNOSIS — D701 Agranulocytosis secondary to cancer chemotherapy: Secondary | ICD-10-CM

## 2018-08-21 DIAGNOSIS — R531 Weakness: Secondary | ICD-10-CM | POA: Diagnosis not present

## 2018-08-21 DIAGNOSIS — Z803 Family history of malignant neoplasm of breast: Secondary | ICD-10-CM | POA: Insufficient documentation

## 2018-08-21 DIAGNOSIS — Z85828 Personal history of other malignant neoplasm of skin: Secondary | ICD-10-CM | POA: Insufficient documentation

## 2018-08-21 DIAGNOSIS — Z5189 Encounter for other specified aftercare: Secondary | ICD-10-CM | POA: Diagnosis present

## 2018-08-21 DIAGNOSIS — T451X5A Adverse effect of antineoplastic and immunosuppressive drugs, initial encounter: Secondary | ICD-10-CM

## 2018-08-21 DIAGNOSIS — Z8249 Family history of ischemic heart disease and other diseases of the circulatory system: Secondary | ICD-10-CM | POA: Insufficient documentation

## 2018-08-21 DIAGNOSIS — C786 Secondary malignant neoplasm of retroperitoneum and peritoneum: Secondary | ICD-10-CM

## 2018-08-21 DIAGNOSIS — K5900 Constipation, unspecified: Secondary | ICD-10-CM | POA: Insufficient documentation

## 2018-08-21 DIAGNOSIS — Z5111 Encounter for antineoplastic chemotherapy: Secondary | ICD-10-CM | POA: Insufficient documentation

## 2018-08-21 DIAGNOSIS — R1011 Right upper quadrant pain: Secondary | ICD-10-CM | POA: Insufficient documentation

## 2018-08-21 DIAGNOSIS — D702 Other drug-induced agranulocytosis: Secondary | ICD-10-CM

## 2018-08-21 DIAGNOSIS — R5383 Other fatigue: Secondary | ICD-10-CM | POA: Diagnosis not present

## 2018-08-21 DIAGNOSIS — D709 Neutropenia, unspecified: Secondary | ICD-10-CM | POA: Diagnosis not present

## 2018-08-21 DIAGNOSIS — Z95828 Presence of other vascular implants and grafts: Secondary | ICD-10-CM

## 2018-08-21 DIAGNOSIS — Z7189 Other specified counseling: Secondary | ICD-10-CM

## 2018-08-21 LAB — CBC WITH DIFFERENTIAL/PLATELET
Abs Immature Granulocytes: 0.03 10*3/uL (ref 0.00–0.07)
Basophils Absolute: 0 10*3/uL (ref 0.0–0.1)
Basophils Relative: 0 %
Eosinophils Absolute: 0 10*3/uL (ref 0.0–0.5)
Eosinophils Relative: 1 %
HCT: 20.6 % — ABNORMAL LOW (ref 36.0–46.0)
Hemoglobin: 6.6 g/dL — CL (ref 12.0–15.0)
Immature Granulocytes: 1 %
Lymphocytes Relative: 39 %
Lymphs Abs: 1.5 10*3/uL (ref 0.7–4.0)
MCH: 34.7 pg — ABNORMAL HIGH (ref 26.0–34.0)
MCHC: 32 g/dL (ref 30.0–36.0)
MCV: 108.4 fL — ABNORMAL HIGH (ref 80.0–100.0)
Monocytes Absolute: 0.4 10*3/uL (ref 0.1–1.0)
Monocytes Relative: 11 %
Neutro Abs: 1.9 10*3/uL (ref 1.7–7.7)
Neutrophils Relative %: 48 %
Platelets: 84 10*3/uL — ABNORMAL LOW (ref 150–400)
RBC: 1.9 MIL/uL — ABNORMAL LOW (ref 3.87–5.11)
RDW: 15.7 % — ABNORMAL HIGH (ref 11.5–15.5)
WBC: 3.9 10*3/uL — ABNORMAL LOW (ref 4.0–10.5)
nRBC: 0 % (ref 0.0–0.2)

## 2018-08-21 LAB — COMPREHENSIVE METABOLIC PANEL
ALT: 26 U/L (ref 0–44)
AST: 26 U/L (ref 15–41)
Albumin: 4.2 g/dL (ref 3.5–5.0)
Alkaline Phosphatase: 92 U/L (ref 38–126)
Anion gap: 11 (ref 5–15)
BUN: 14 mg/dL (ref 6–20)
CO2: 21 mmol/L — ABNORMAL LOW (ref 22–32)
Calcium: 8.7 mg/dL — ABNORMAL LOW (ref 8.9–10.3)
Chloride: 109 mmol/L (ref 98–111)
Creatinine, Ser: 0.99 mg/dL (ref 0.44–1.00)
GFR calc Af Amer: >60 mL/min (ref >60)
GFR calc non Af Amer: >60 mL/min (ref >60)
Glucose, Bld: 89 mg/dL (ref 70–99)
Potassium: 4.4 mmol/L (ref 3.5–5.1)
Sodium: 141 mmol/L (ref 135–145)
Total Bilirubin: 0.2 mg/dL — ABNORMAL LOW (ref 0.3–1.2)
Total Protein: 7.1 g/dL (ref 6.5–8.1)

## 2018-08-21 LAB — FERRITIN: Ferritin: 325 ng/mL — ABNORMAL HIGH (ref 11–307)

## 2018-08-21 MED ORDER — SODIUM CHLORIDE 0.9 % IV SOLN
167.0000 mg | Freq: Once | INTRAVENOUS | Status: AC
  Administered 2018-08-21: 170 mg via INTRAVENOUS
  Filled 2018-08-21: qty 17

## 2018-08-21 MED ORDER — SODIUM CHLORIDE 0.9 % IV SOLN
Freq: Once | INTRAVENOUS | Status: AC
  Administered 2018-08-21: 10:00:00 via INTRAVENOUS
  Filled 2018-08-21: qty 250

## 2018-08-21 MED ORDER — DEXAMETHASONE SODIUM PHOSPHATE 10 MG/ML IJ SOLN
INTRAMUSCULAR | Status: AC
  Filled 2018-08-21: qty 1

## 2018-08-21 MED ORDER — DIPHENHYDRAMINE HCL 50 MG/ML IJ SOLN
25.0000 mg | Freq: Once | INTRAMUSCULAR | Status: AC
  Administered 2018-08-21: 25 mg via INTRAVENOUS

## 2018-08-21 MED ORDER — FAMOTIDINE IN NACL 20-0.9 MG/50ML-% IV SOLN
20.0000 mg | Freq: Once | INTRAVENOUS | Status: AC
  Administered 2018-08-21: 20 mg via INTRAVENOUS

## 2018-08-21 MED ORDER — DIPHENHYDRAMINE HCL 50 MG/ML IJ SOLN
INTRAMUSCULAR | Status: AC
  Filled 2018-08-21: qty 1

## 2018-08-21 MED ORDER — HEPARIN SOD (PORK) LOCK FLUSH 100 UNIT/ML IV SOLN
500.0000 [IU] | Freq: Once | INTRAVENOUS | Status: AC | PRN
  Administered 2018-08-21: 500 [IU]
  Filled 2018-08-21: qty 5

## 2018-08-21 MED ORDER — DARBEPOETIN ALFA 300 MCG/0.6ML IJ SOSY
300.0000 ug | PREFILLED_SYRINGE | Freq: Once | INTRAMUSCULAR | Status: AC
  Administered 2018-08-21: 300 ug via SUBCUTANEOUS

## 2018-08-21 MED ORDER — PALONOSETRON HCL INJECTION 0.25 MG/5ML
INTRAVENOUS | Status: AC
  Filled 2018-08-21: qty 5

## 2018-08-21 MED ORDER — PALONOSETRON HCL INJECTION 0.25 MG/5ML
0.2500 mg | Freq: Once | INTRAVENOUS | Status: AC
  Administered 2018-08-21: 0.25 mg via INTRAVENOUS

## 2018-08-21 MED ORDER — PEGFILGRASTIM 6 MG/0.6ML ~~LOC~~ PSKT
6.0000 mg | PREFILLED_SYRINGE | Freq: Once | SUBCUTANEOUS | Status: AC
  Administered 2018-08-21: 6 mg via SUBCUTANEOUS

## 2018-08-21 MED ORDER — DEXAMETHASONE SODIUM PHOSPHATE 10 MG/ML IJ SOLN
10.0000 mg | Freq: Once | INTRAMUSCULAR | Status: AC
  Administered 2018-08-21: 10 mg via INTRAVENOUS

## 2018-08-21 MED ORDER — PEGFILGRASTIM 6 MG/0.6ML ~~LOC~~ PSKT
PREFILLED_SYRINGE | SUBCUTANEOUS | Status: AC
  Filled 2018-08-21: qty 0.6

## 2018-08-21 MED ORDER — DARBEPOETIN ALFA 300 MCG/0.6ML IJ SOSY
PREFILLED_SYRINGE | INTRAMUSCULAR | Status: AC
  Filled 2018-08-21: qty 0.6

## 2018-08-21 MED ORDER — SODIUM CHLORIDE 0.9% FLUSH
10.0000 mL | INTRAVENOUS | Status: DC | PRN
  Administered 2018-08-21: 10 mL
  Filled 2018-08-21: qty 10

## 2018-08-21 MED ORDER — SODIUM CHLORIDE 0.9 % IV SOLN
800.0000 mg/m2 | Freq: Once | INTRAVENOUS | Status: AC
  Administered 2018-08-21: 1292 mg via INTRAVENOUS
  Filled 2018-08-21: qty 33.98

## 2018-08-21 MED ORDER — FAMOTIDINE IN NACL 20-0.9 MG/50ML-% IV SOLN
INTRAVENOUS | Status: AC
  Filled 2018-08-21: qty 50

## 2018-08-21 NOTE — Telephone Encounter (Signed)
Per MD notification of heme of 6.6 and platelets of 84,000 - MD stated ok to proceed with planned treatment for today. Will request to schedule blood transfusion for later this week.

## 2018-08-21 NOTE — Telephone Encounter (Signed)
Scheduled appt per 7/06 sch message - pt aware of appt date and time and will get an updated schedule in treatment area.

## 2018-08-21 NOTE — Patient Instructions (Signed)
[facility] Discharge Instructions for Patients Receiving Chemotherapy  Today you received the following chemotherapy agents: Gemzar/Carboplatin.  To help prevent nausea and vomiting after your treatment, we encourage you to take your nausea medication as directed.   If you develop nausea and vomiting that is not controlled by your nausea medication, call the clinic.   BELOW ARE SYMPTOMS THAT SHOULD BE REPORTED IMMEDIATELY:  *FEVER GREATER THAN 100.5 F  *CHILLS WITH OR WITHOUT FEVER  NAUSEA AND VOMITING THAT IS NOT CONTROLLED WITH YOUR NAUSEA MEDICATION  *UNUSUAL SHORTNESS OF BREATH  *UNUSUAL BRUISING OR BLEEDING  TENDERNESS IN MOUTH AND THROAT WITH OR WITHOUT PRESENCE OF ULCERS  *URINARY PROBLEMS  *BOWEL PROBLEMS  UNUSUAL RASH Items with * indicate a potential emergency and should be followed up as soon as possible.  Feel free to call the clinic should you have any questions or concerns. The clinic phone number is [phone].  Please show the CHEMO ALERT CARD at check-in to the Emergency Department and triage nurse.   

## 2018-08-22 ENCOUNTER — Telehealth: Payer: Self-pay | Admitting: Oncology

## 2018-08-22 ENCOUNTER — Other Ambulatory Visit: Payer: Self-pay | Admitting: *Deleted

## 2018-08-22 DIAGNOSIS — D6481 Anemia due to antineoplastic chemotherapy: Secondary | ICD-10-CM

## 2018-08-22 DIAGNOSIS — T451X5A Adverse effect of antineoplastic and immunosuppressive drugs, initial encounter: Secondary | ICD-10-CM

## 2018-08-22 LAB — CA 125: Cancer Antigen (CA) 125: 147 U/mL — ABNORMAL HIGH (ref 0.0–38.1)

## 2018-08-22 NOTE — Telephone Encounter (Signed)
I could not reach patient °

## 2018-08-23 ENCOUNTER — Other Ambulatory Visit: Payer: Self-pay

## 2018-08-23 ENCOUNTER — Telehealth: Payer: Self-pay | Admitting: *Deleted

## 2018-08-23 ENCOUNTER — Inpatient Hospital Stay: Payer: 59

## 2018-08-23 DIAGNOSIS — Z5111 Encounter for antineoplastic chemotherapy: Secondary | ICD-10-CM | POA: Diagnosis not present

## 2018-08-23 DIAGNOSIS — C762 Malignant neoplasm of abdomen: Secondary | ICD-10-CM

## 2018-08-23 DIAGNOSIS — C5701 Malignant neoplasm of right fallopian tube: Secondary | ICD-10-CM

## 2018-08-23 DIAGNOSIS — T451X5A Adverse effect of antineoplastic and immunosuppressive drugs, initial encounter: Secondary | ICD-10-CM

## 2018-08-23 DIAGNOSIS — D6481 Anemia due to antineoplastic chemotherapy: Secondary | ICD-10-CM

## 2018-08-23 LAB — CBC WITH DIFFERENTIAL/PLATELET
Abs Immature Granulocytes: 0.5 10*3/uL — ABNORMAL HIGH (ref 0.00–0.07)
Band Neutrophils: 10 %
Basophils Absolute: 0 10*3/uL (ref 0.0–0.1)
Basophils Relative: 0 %
Eosinophils Absolute: 0 10*3/uL (ref 0.0–0.5)
Eosinophils Relative: 0 %
HCT: 20.8 % — ABNORMAL LOW (ref 36.0–46.0)
Hemoglobin: 6.6 g/dL — CL (ref 12.0–15.0)
Lymphocytes Relative: 4 %
Lymphs Abs: 1 10*3/uL (ref 0.7–4.0)
MCH: 35.5 pg — ABNORMAL HIGH (ref 26.0–34.0)
MCHC: 31.7 g/dL (ref 30.0–36.0)
MCV: 111.8 fL — ABNORMAL HIGH (ref 80.0–100.0)
Metamyelocytes Relative: 2 %
Monocytes Absolute: 0.3 10*3/uL (ref 0.1–1.0)
Monocytes Relative: 1 %
Neutro Abs: 23.6 10*3/uL — ABNORMAL HIGH (ref 1.7–17.7)
Neutrophils Relative %: 83 %
Platelets: 96 10*3/uL — ABNORMAL LOW (ref 150–400)
RBC: 1.86 MIL/uL — ABNORMAL LOW (ref 3.87–5.11)
RDW: 16.3 % — ABNORMAL HIGH (ref 11.5–15.5)
WBC: 25.4 10*3/uL — ABNORMAL HIGH (ref 4.0–10.5)
nRBC: 0 % (ref 0.0–0.2)

## 2018-08-23 LAB — ABO/RH: ABO/RH(D): A POS

## 2018-08-23 LAB — PREPARE RBC (CROSSMATCH)

## 2018-08-23 LAB — COMPREHENSIVE METABOLIC PANEL
ALT: 71 U/L — ABNORMAL HIGH (ref 0–44)
AST: 64 U/L — ABNORMAL HIGH (ref 15–41)
Albumin: 4.3 g/dL (ref 3.5–5.0)
Alkaline Phosphatase: 104 U/L (ref 38–126)
Anion gap: 8 (ref 5–15)
BUN: 21 mg/dL — ABNORMAL HIGH (ref 6–20)
CO2: 23 mmol/L (ref 22–32)
Calcium: 8.7 mg/dL — ABNORMAL LOW (ref 8.9–10.3)
Chloride: 107 mmol/L (ref 98–111)
Creatinine, Ser: 1.03 mg/dL — ABNORMAL HIGH (ref 0.44–1.00)
GFR calc Af Amer: >60 mL/min (ref >60)
GFR calc non Af Amer: >60 mL/min (ref >60)
Glucose, Bld: 89 mg/dL (ref 70–99)
Potassium: 4.7 mmol/L (ref 3.5–5.1)
Sodium: 138 mmol/L (ref 135–145)
Total Bilirubin: 0.5 mg/dL (ref 0.3–1.2)
Total Protein: 7.3 g/dL (ref 6.5–8.1)

## 2018-08-23 LAB — MAGNESIUM: Magnesium: 2 mg/dL (ref 1.7–2.4)

## 2018-08-23 MED ORDER — SODIUM CHLORIDE 0.9% FLUSH
10.0000 mL | INTRAVENOUS | Status: AC | PRN
  Administered 2018-08-23: 10 mL
  Filled 2018-08-23: qty 10

## 2018-08-23 MED ORDER — HEPARIN SOD (PORK) LOCK FLUSH 100 UNIT/ML IV SOLN
250.0000 [IU] | INTRAVENOUS | Status: AC | PRN
  Administered 2018-08-23: 250 [IU]
  Filled 2018-08-23: qty 5

## 2018-08-23 MED ORDER — ACETAMINOPHEN 325 MG PO TABS
650.0000 mg | ORAL_TABLET | Freq: Once | ORAL | Status: AC
  Administered 2018-08-23: 650 mg via ORAL

## 2018-08-23 MED ORDER — DIPHENHYDRAMINE HCL 25 MG PO CAPS
25.0000 mg | ORAL_CAPSULE | Freq: Once | ORAL | Status: AC
  Administered 2018-08-23: 25 mg via ORAL

## 2018-08-23 MED ORDER — SODIUM CHLORIDE 0.9% IV SOLUTION
250.0000 mL | Freq: Once | INTRAVENOUS | Status: AC
  Administered 2018-08-23: 250 mL via INTRAVENOUS
  Filled 2018-08-23: qty 250

## 2018-08-23 MED ORDER — ACETAMINOPHEN 325 MG PO TABS
ORAL_TABLET | ORAL | Status: AC
  Filled 2018-08-23: qty 2

## 2018-08-23 MED ORDER — DIPHENHYDRAMINE HCL 25 MG PO CAPS
ORAL_CAPSULE | ORAL | Status: AC
  Filled 2018-08-23: qty 1

## 2018-08-23 NOTE — Patient Instructions (Signed)
Blood Transfusion, Adult, Care After This sheet gives you information about how to care for yourself after your procedure. Your doctor may also give you more specific instructions. If you have problems or questions, contact your doctor. Follow these instructions at home:   Take over-the-counter and prescription medicines only as told by your doctor.  Go back to your normal activities as told by your doctor.  Follow instructions from your doctor about how to take care of the area where an IV tube was put into your vein (insertion site). Make sure you: ? Wash your hands with soap and water before you change your bandage (dressing). If there is no soap and water, use hand sanitizer. ? Change your bandage as told by your doctor.  Check your IV insertion site every day for signs of infection. Check for: ? More redness, swelling, or pain. ? More fluid or blood. ? Warmth. ? Pus or a bad smell. Contact a doctor if:  You have more redness, swelling, or pain around the IV insertion site.  You have more fluid or blood coming from the IV insertion site.  Your IV insertion site feels warm to the touch.  You have pus or a bad smell coming from the IV insertion site.  Your pee (urine) turns pink, red, or brown.  You feel weak after doing your normal activities. Get help right away if:  You have signs of a serious allergic or body defense (immune) system reaction, including: ? Itchiness. ? Hives. ? Trouble breathing. ? Anxiety. ? Pain in your chest or lower back. ? Fever, flushing, and chills. ? Fast pulse. ? Rash. ? Watery poop (diarrhea). ? Throwing up (vomiting). ? Dark pee. ? Serious headache. ? Dizziness. ? Stiff neck. ? Yellow color in your face or the white parts of your eyes (jaundice). Summary  After a blood transfusion, return to your normal activities as told by your doctor.  Every day, check for signs of infection where the IV tube was put into your vein.  Some  signs of infection are warm skin, more redness and pain, more fluid or blood, and pus or a bad smell where the needle went in.  Contact your doctor if you feel weak or have any unusual symptoms. This information is not intended to replace advice given to you by your health care provider. Make sure you discuss any questions you have with your health care provider. Document Released: 02/22/2014 Document Revised: 06/08/2017 Document Reviewed: 09/26/2015 Elsevier Patient Education  2020 Elsevier Inc.  

## 2018-08-23 NOTE — Telephone Encounter (Signed)
Received call report from [facility].  "Today's Hgb = 6.6.  We do have and going ahead sending to blood bank."   Routing phone note with results.  Scheduled appointment noted for today for visit type as blood transfusion.

## 2018-08-24 LAB — TYPE AND SCREEN
ABO/RH(D): A POS
Antibody Screen: NEGATIVE
Unit division: 0

## 2018-08-24 LAB — BPAM RBC
Blood Product Expiration Date: 202007252359
ISSUE DATE / TIME: 202007081521
Unit Type and Rh: 6200

## 2018-08-25 ENCOUNTER — Telehealth: Payer: Self-pay | Admitting: *Deleted

## 2018-08-25 ENCOUNTER — Other Ambulatory Visit: Payer: Self-pay | Admitting: *Deleted

## 2018-08-25 DIAGNOSIS — D6481 Anemia due to antineoplastic chemotherapy: Secondary | ICD-10-CM

## 2018-08-25 NOTE — Telephone Encounter (Signed)
Pt called to this RN to state she " feels like a new woman after getting blood ".  She is concerned due to treatment on 7/17 " and then [place] are going to the beach for a week - I felt so weak prior to the transfusion".  This RN informed pt above can be accommodated - order entered for [facility] as well appt request to add time to current appt for possible blood transfusion.

## 2018-08-28 ENCOUNTER — Other Ambulatory Visit: Payer: Self-pay | Admitting: Oncology

## 2018-08-30 ENCOUNTER — Other Ambulatory Visit: Payer: Self-pay | Admitting: Oncology

## 2018-08-31 NOTE — Progress Notes (Signed)
[place]  Telephone:([phone]) [phone] Fax:(336) [phone]    ID: [name] DOB: 07/05/1965  MR#: [id number]  BOF#:[id number]  Patient Care Team: Harlan Stains, MD as PCP - General (Family Medicine) [name], MD as Consulting Physician (Obstetrics and Gynecology) Juanita Craver, MD as Consulting Physician (Gastroenterology) Servando Salina, MD as Consulting Physician (Obstetrics and Gynecology) Nickie Retort, MD as Consulting Physician (Urology) Gillis Ends, MD as Referring Physician (Obstetrics and Gynecology) OTHER MD: Festus Aloe [phone]) Floyde Parkins   CHIEF COMPLAINT:  ovarian cancer  CURRENT TREATMENT: carboplatin, gemcitabine; Aranesp; bevacizumab   INTERVAL HISTORY: Ruthe returns today for follow-up and treatment of her recurrent ovarian cancer.  She continues on chemotherapy currently consisting of carboplatin and gemcitabinegiven every 14 days, with onpro support. Today is day 1 cycle 3.   REVIEW OF SYSTEMS: Celicia is feeling very tired and weak.  She notes a slight easy bruising, on her arm, but no other easy bruising/bleeding.  She notes a mild shortness of breath on exertion.  She is still experiencing neuropathy, however her feet are worse than her hands, particularly at night.  Last night she didn't experience the pain and that was a welcome relief.  Aziyah denies any nausea or vomiting.  She has no areas of new pain.   Teria is going on vacation next week to [facility].  She has no fever or chills.  She is without cough, chest pain or palpitations.  She has no bowel/bladder changes. A detailed ROS was otherwise non contributory today.    HISTORY OF PRESENT ILLNESS: From Dr. Serita Grit original intake note 03/17/2015:  "Annika Selke is a very pleasant G4P4 who is seen in consultation at the request of Dr Collene Mares and Dr Garwin Brothers for peritoneal carcinomatosis. The patient has a history of a workup by urology for gross  hematuria which included a pelvic examine was suggested for extrinsic mass effect on the bladder on cystoscopy. Cystoscopy took place on in December 2016. The patient denies abdominal pain bloating early satiety or abdominal distention.  On 08/03/2015 at [facility] she underwent a CT scan of the abdomen and pelvis as ordered by Dr Baruch Gouty. This revealed a 1.4 cm low attenuation lesion on the posterior right hepatic lobe suggestive of a hemangioma. Status post hysterectomy. No ovarian masses. Moderate ascites. Omental caking seen in the lateral left abdomen and pelvis. Peritoneal nodularity in the left lateral pelvic cul-de-sac. No gross extrinsic compression on the bladder was identified on imaging.  The patient was then seen by her gastroenterologist, Dr. Collene Mares, who performed a colonoscopy which was unremarkable.  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 She was treated adjuvantly with carboplatin and paclitaxel, as detailed below. Her CA 125 dropped from 1226 on 03/20/2015 to 26.1 by 06/16/2015.  Her subsequent history is as detailed above.   PAST MEDICAL HISTORY: Past Medical History:  Diagnosis Date  . Acute sensory neuropathy 06/26/2015  . Allergy   . Anemia   . Asthma    illness induced asthma  . Blood transfusion without reported diagnosis    at age 53 years old D/T surgery to femur being crushed  . Complication of anesthesia    hx.  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 Heart murmur    pt, states had a "working heart murmur"  . History of kidney stones   . Neutropenia, drug-induced ([place])  03/10/2018  . PONV (postoperative nausea and vomiting)   . Skin cancer     PAST SURGICAL HISTORY: Past Surgical History:  Procedure Laterality Date  . 3 laporscopic proceedures    . ABDOMINAL HYSTERECTOMY     2010  . CHOLECYSTECTOMY     2010  . DILATION AND CURETTAGE OF UTERUS     1997  . IR IMAGING GUIDED PORT INSERTION  03/08/2018  . LAPAROSCOPY N/A 03/27/2015   Procedure: DIAGNOSTIC LAPAROSCOPY ;  Surgeon: [name], MD;  Location: [facility];  Service: Gynecology;  Laterality: N/A;  . LAPAROSCOPY N/A 02/24/2016   Procedure: LAPAROSCOPY DIAGNOSTIC WITH PERITONEAL WASHINGS AND PERITONEAL BIOPSY;  Surgeon: [name], MD;  Location: [facility];  Service: Gynecology;  Laterality: N/A;  . LAPAROTOMY N/A 03/27/2015   Procedure: EXPLORATORY LAPAROTOMY, BILATERAL SALPINGO OOPHORECTOMY, OMENTECTOMY, RADICAL TUMOR DEBULKING;  Surgeon: [name], MD;  Location: [facility];  Service: Gynecology;  Laterality: N/A;  . sinus surgery 1994      FAMILY HISTORY Family History  Problem Relation Age of Onset  . Hypertension Father   . Skin cancer Father        nonmelanoma skin cancers in his late 23s  . Non-Hodgkin's lymphoma Maternal Aunt        dx. 3s; smoker  . Stroke Maternal Grandmother   . Other Mother        benign meningioma dx. early-mid-70s; hysterectomy in her late 57s for heavy periods - still has ovaries  . Other Son        one son with pre-cancerous skin findings  . Other Daughter        cysts on ovaries and hx of heavy periods  . Other Sister        hysterectomy for cysts - still has ovaries  . Heart attack Maternal Grandfather   . Heart attack Paternal Grandfather   . Renal cancer Maternal Aunt 60       smoker  . Breast cancer Maternal Aunt 78  . Other Maternal Aunt        dx. benign brain tumor (meningioma) at 53; 2nd benign brain tumor in her late 88s; hx of radical hysterectomy at age 69  . Brain cancer Other        NOS tumor  The patient's parents are both living, in their late  41s as of January 2018. The patient has one brother, 3 sisters. There is no history of ovarian cancer in the family. One maternal aunt had breast and kidney cancer, another had non-Hodgkin's lymphoma.   GYNECOLOGIC HISTORY:  No LMP recorded. Patient has had a hysterectomy. 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 Her husband [name] is a Engineer, maintenance (IT). Their children are 63, 12, 64 and 34 y/o as of JAN 2018. The oldest works as an Electrical engineer in the [employer] in [place]. The other 3 children live in [place], the youngest currently attending [employer]. The patient has no grandchildren. She attends [employer]   ADVANCED DIRECTIVES:    HEALTH MAINTENANCE: Social History   Tobacco Use  . Smoking status: Never Smoker  . Smokeless tobacco: Never Used  Substance Use Topics  . Alcohol use: Yes    Comment:  3 glasses wine or beer/week  . Drug use: No    Colonoscopy:2016  PAP: s/p hyst  Bone density:   Allergies  Allergen Reactions  . Bee Venom Shortness Of Breath    swelling  . Compazine [Prochlorperazine Edisylate] Anaphylaxis, Anxiety and Palpitations  . Sulfa Antibiotics Shortness Of Breath    Vomit , diarrhea , hives  . Zarxio [Filgrastim]     Current Outpatient Medications  Medication Sig Dispense Refill  . fluticasone (FLONASE) 50 MCG/ACT nasal spray Place 1 spray into both nostrils 2 (two) times daily. (Patient taking differently: Place 1 spray into both nostrils as needed. ) 16 g 2  . gabapentin (NEURONTIN) 100 MG capsule Take 2 capsules (200 mg total) by mouth 3 (three) times daily. 540 capsule 1  . lidocaine-prilocaine (EMLA) cream APPLY TOPICALLY AS NEEDED. 30 g 0  . loratadine (CLARITIN) 10 MG tablet Take 10 mg by mouth as needed.     . magnesium oxide (MAG-OX) 400 (241.3 Mg) MG tablet Take 1 tablet (400 mg total) by mouth daily. 60 tablet 3  . omeprazole  (PRILOSEC) 40 MG capsule Take 1 capsule (40 mg total) by mouth daily. 30 capsule 5  . sertraline (ZOLOFT) 50 MG tablet TAKE 1 TABLET ONCE DAILY. 30 tablet 0  . valACYclovir (VALTREX) 1000 MG tablet Take 1 tablet (1,000 mg total) by mouth daily. 5 tablet 0  . diphenhydrAMINE (BENADRYL) 50 MG capsule 50 mg Diphenhydramine (Benadryl) PO within 1 hour of the injection 1 capsule 0   No current facility-administered medications for this visit.    Facility-Administered Medications Ordered in Other Visits  Medication Dose Route Frequency Provider Last Rate Last Dose  . heparin lock flush 100 unit/mL  500 Units Intravenous Once Magrinat, Virgie Dad, MD      . sodium chloride flush (NS) 0.9 % injection 10 mL  10 mL Intravenous PRN Magrinat, Virgie Dad, MD        OBJECTIVE:  Vitals:   09/01/18 0952  BP: (!) 128/55  Pulse: 68  Resp: 18  Temp: 98.7 F (37.1 C)  SpO2: 100%     Body mass index is 22.85 kg/m.   ECOG FS:1 - Symptomatic but completely ambulatory GENERAL: Patient is a tired and pale appearing female in no acute distress HEENT:  Sclerae anicteric.  Oropharynx clear and moist. No ulcerations or evidence of oropharyngeal candidiasis. Neck is supple.  NODES:  No cervical, supraclavicular, or axillary lymphadenopathy palpated.  LUNGS:  Clear to auscultation bilaterally.  No wheezes or rhonchi. HEART:  Regular rate and rhythm. No murmur appreciated. ABDOMEN:  Soft, nontender.  Positive, normoactive bowel sounds. No organomegaly palpated. MSK:  No focal spinal tenderness to palpation. Full range of motion bilaterally in the upper extremities. EXTREMITIES:  No peripheral edema.   SKIN:  Clear with no obvious rashes or skin changes. No nail dyscrasia. NEURO:  Nonfocal. Well oriented.  Appropriate affect.   LAB RESULTS:  CMP     Component Value Date/Time   NA 139 09/01/2018 0917   NA 140 02/11/2017 0755   K 4.9 09/01/2018 0917   K 4.4 02/11/2017 0755   CL 107 09/01/2018 0917   CO2 23  09/01/2018 0917   CO2 26 02/11/2017 0755   GLUCOSE 94 09/01/2018 0917   GLUCOSE 93 02/11/2017 0755   BUN 18 09/01/2018 0917   BUN 7.9 02/11/2017 0755   CREATININE 0.96 09/01/2018 1740  CREATININE 1.06 (H) 06/19/2018 0931   CREATININE 0.7 02/11/2017 0755   CALCIUM 9.3 09/01/2018 0917   CALCIUM 9.6 02/11/2017 0755   PROT 7.4 09/01/2018 0917   PROT 7.5 02/11/2017 0755   ALBUMIN 4.4 09/01/2018 0917   ALBUMIN 4.4 02/11/2017 0755   AST 28 09/01/2018 0917   AST 22 06/19/2018 0931   AST 27 02/11/2017 0755   ALT 40 09/01/2018 0917   ALT 13 06/19/2018 0931   ALT 24 02/11/2017 0755   ALKPHOS 119 09/01/2018 0917   ALKPHOS 58 02/11/2017 0755   BILITOT 0.3 09/01/2018 0917   BILITOT 0.2 (L) 06/19/2018 0931   BILITOT 0.43 02/11/2017 0755   GFRNONAA >60 09/01/2018 0917   GFRNONAA 59 (L) 06/19/2018 0931   GFRAA >60 09/01/2018 0917   GFRAA >60 06/19/2018 0931    INo results found for: SPEP, UPEP  Lab Results  Component Value Date   WBC 5.0 09/01/2018   NEUTROABS 2.7 09/01/2018   HGB 8.0 (L) 09/01/2018   HCT 24.0 (L) 09/01/2018   MCV 103.9 (H) 09/01/2018   PLT 55 (L) 09/01/2018      Chemistry      Component Value Date/Time   NA 139 09/01/2018 0917   NA 140 02/11/2017 0755   K 4.9 09/01/2018 0917   K 4.4 02/11/2017 0755   CL 107 09/01/2018 0917   CO2 23 09/01/2018 0917   CO2 26 02/11/2017 0755   BUN 18 09/01/2018 0917   BUN 7.9 02/11/2017 0755   CREATININE 0.96 09/01/2018 0917   CREATININE 1.06 (H) 06/19/2018 0931   CREATININE 0.7 02/11/2017 0755      Component Value Date/Time   CALCIUM 9.3 09/01/2018 0917   CALCIUM 9.6 02/11/2017 0755   ALKPHOS 119 09/01/2018 0917   ALKPHOS 58 02/11/2017 0755   AST 28 09/01/2018 0917   AST 22 06/19/2018 0931   AST 27 02/11/2017 0755   ALT 40 09/01/2018 0917   ALT 13 06/19/2018 0931   ALT 24 02/11/2017 0755   BILITOT 0.3 09/01/2018 0917   BILITOT 0.2 (L) 06/19/2018 0931   BILITOT 0.43 02/11/2017 0755       No results found  for: LABCA2  No components found for: IBBCW888  No results for input(s): INR in the last 168 hours.  Urinalysis    Component Value Date/Time   COLORURINE STRAW (A) 11/22/2017 0940   APPEARANCEUR CLEAR 11/22/2017 0940   LABSPEC 1.002 (L) 11/22/2017 0940   LABSPEC 1.005 10/09/2015 1239   PHURINE 7.0 11/22/2017 0940   GLUCOSEU NEGATIVE 11/22/2017 0940   GLUCOSEU Negative 10/09/2015 1239   HGBUR NEGATIVE 11/22/2017 0940   BILIRUBINUR NEGATIVE 11/22/2017 0940   BILIRUBINUR Negative 10/09/2015 1239   KETONESUR NEGATIVE 11/22/2017 0940   PROTEINUR NEGATIVE 11/22/2017 0940   UROBILINOGEN 0.2 10/09/2015 1239   NITRITE NEGATIVE 11/22/2017 0940   LEUKOCYTESUR NEGATIVE 11/22/2017 0940   LEUKOCYTESUR Negative 10/09/2015 1239    STUDIES: Mm 3d Screen Breast Bilateral  Result Date: 08/02/2018 CLINICAL DATA:  Screening. EXAM: DIGITAL SCREENING BILATERAL MAMMOGRAM WITH TOMO AND CAD COMPARISON:  Previous exam(s). ACR Breast Density Category c: The breast tissue is heterogeneously dense, which may obscure small masses. FINDINGS: There are no findings suspicious for malignancy. Images were processed with CAD. IMPRESSION: No mammographic evidence of malignancy. A result letter of this screening mammogram will be mailed directly to the patient. RECOMMENDATION: Screening mammogram in one year. (Code:SM-B-01Y) BI-RADS CATEGORY  1: Negative. Electronically Signed   By: Ammie Ferrier M.D.  On: 08/02/2018 16:45    ELIGIBLE FOR AVAILABLE RESEARCH PROTOCOL:  no  ASSESSMENT: 53 y.o. BRCA negative [place] woman status post radical tumor debulking with optimal cytoreduction (R0) 03/27/2015 for a stage IIIC, high-grade right fallopian tube carcinoma  (a) baseline CA-125 was 1226.  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)  (c) tumor  is PD-L1 negative (02/24/2016)  (d) tumor is strongly estrogen receptor positive, progesterone receptor negative (02/24/2016)  (1) adjuvant chemotherapy consisted of carboplatin and paclitaxel for 6 doses, begun 04/14/2015, completed 08/11/2015  (a) paclitaxel was omitted from cycle 5 and dose reduced on cycle 6 because of neuropathy   (b) a "make up" dose of paclitaxel was given 08/25/2015  (c) last carboplatin dose was 08/11/2015  (d) CA-125 normalized by 06/16/2015   (2) FIRST RECURRENCE: January 2018  (a) CA-125 rise beginning November 2017 led to CT scans and PET scans December 2017, all negative  (b) exploratory laparotomy 02/24/2016 showed pathologically confirmed recurrence, with miliary disease involving all examined surfaces  (c) port-site metastases noted 03/08/2016  (d) the January 2018 tumor sample was tested for the estrogen receptor and he was 80 percent positive with strong staining, progesterone receptor negative  (3) carboplatin/liposomal doxorubicin started 03/05/2016, repeated every three weeks x4, completed 06/04/2016.  (a) cycle 3 delayed one week because of neutropenia; OnPro added  (b) CA 125 normalized after cycle 3  (4) RUCAPARIB maintenance started 07/02/2016  (a) restaging 10/01/2016: Normal CA 125, negative CT of the abdomen and pelvis  (b) labs 11/24/2016 shows a rise in her CA 125-43.4, with continuing rise thereafter  (c) rucaparib discontinued October 2018 with rising CA 125  (5) anastrozole started December 23, 2016-stopped after a couple of weeks with poor tolerance  SECOND RECURRENCE: (6) Gemcitabine/Bevacizumab started on 02/04/2017  (a) gemcitabine omitted cycle 4 because of intercurrent infection  (b) gemcitabine resumed with cycle 5, with intervening rise in  Ca 125  (c) repeat CT scan of the abdomen and pelvis on 05/26/2017 does not confirm obvious disease progression  (d) cycle 5 of gemcitabine/bevacizumab delayed 1 week   (e) gemcitabine/  bevacizumab discontinued after 6 cycles, with rising CA 125 (last dose 06/24/2017)  (7) foundation one testing did not show a high mutation burden and the microsatellite status was stable.  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  These were not immediately actionable  (8) Abraxane started 07/12/2017, given weekly or weeks 1 and 2 of each 3 weeks cycle, last dose 11/15/2017 (5 cycles)  (a) cycle 2 started 08/09/2017, will be day 1 day 8 only  (b) cycle 3 scheduled to start 09/13/2017, day 1 day 8 at patient request  (c) CT of the abdomen and pelvis 11/28/2017 shows no measurable disease  (d) PET scan 01/25/2018 and MRI of the pelvis 02/03/2018 show no measurable disease  (e) patient has symptomatic ascites and a rapidly rising Ca1 25  (9) started cisplatin/gemcitabine days 1 and 8 of each 21-day cycle on 02/18/2018  (a) switched to carboplatin/ gemcitabine 07/17/2018 due to neuropathy  (b) carbo/gemzar changed to Q14 days 07/24/2018 due to neutropenia  (10) associated problems  (a) anemia due to chemotherapy: On Aranesp  (b) 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  She is still anemic, and is symptomatic from her anemia.  She really wants to receive one unit of blood, and I ordered this for her today.  Her platelets, however, are 55.  I recommended that we hold her treatment today, and that she return on 09/11/2018 for labs, f/u, and her next cycle of Gemcitabine/Carboplatin.  Gricel is in agreement.  Jamicia will return in 10 days.  She was recommended to continue with the appropriate pandemic precautions.  She knows to call for any other issue that may develop before the next visit here.  A total of (20) minutes of face-to-face time was spent with this patient with greater than 50% of that time in counseling and care-coordination.    Wilber Bihari, NP  09/01/18 10:35 AM Medical Oncology and Hematology [facility] [address] [place], [place] [address] Tel. [phone]    Fax. [phone]

## 2018-09-01 ENCOUNTER — Inpatient Hospital Stay: Payer: 59

## 2018-09-01 ENCOUNTER — Inpatient Hospital Stay (HOSPITAL_BASED_OUTPATIENT_CLINIC_OR_DEPARTMENT_OTHER): Payer: 59 | Admitting: Adult Health

## 2018-09-01 ENCOUNTER — Encounter: Payer: Self-pay | Admitting: Adult Health

## 2018-09-01 ENCOUNTER — Other Ambulatory Visit: Payer: Self-pay

## 2018-09-01 VITALS — BP 128/55 | HR 68 | Temp 98.7°F | Resp 18 | Ht 63.0 in | Wt 129.0 lb

## 2018-09-01 DIAGNOSIS — Z8249 Family history of ischemic heart disease and other diseases of the circulatory system: Secondary | ICD-10-CM

## 2018-09-01 DIAGNOSIS — R188 Other ascites: Secondary | ICD-10-CM

## 2018-09-01 DIAGNOSIS — T451X5A Adverse effect of antineoplastic and immunosuppressive drugs, initial encounter: Secondary | ICD-10-CM

## 2018-09-01 DIAGNOSIS — R531 Weakness: Secondary | ICD-10-CM

## 2018-09-01 DIAGNOSIS — D6481 Anemia due to antineoplastic chemotherapy: Secondary | ICD-10-CM

## 2018-09-01 DIAGNOSIS — C5701 Malignant neoplasm of right fallopian tube: Secondary | ICD-10-CM | POA: Diagnosis not present

## 2018-09-01 DIAGNOSIS — Z5111 Encounter for antineoplastic chemotherapy: Secondary | ICD-10-CM | POA: Diagnosis not present

## 2018-09-01 DIAGNOSIS — C762 Malignant neoplasm of abdomen: Secondary | ICD-10-CM

## 2018-09-01 DIAGNOSIS — Z95828 Presence of other vascular implants and grafts: Secondary | ICD-10-CM

## 2018-09-01 DIAGNOSIS — G620 Drug-induced polyneuropathy: Secondary | ICD-10-CM

## 2018-09-01 DIAGNOSIS — Z85828 Personal history of other malignant neoplasm of skin: Secondary | ICD-10-CM

## 2018-09-01 DIAGNOSIS — R1011 Right upper quadrant pain: Secondary | ICD-10-CM

## 2018-09-01 DIAGNOSIS — K5900 Constipation, unspecified: Secondary | ICD-10-CM

## 2018-09-01 DIAGNOSIS — C786 Secondary malignant neoplasm of retroperitoneum and peritoneum: Secondary | ICD-10-CM

## 2018-09-01 DIAGNOSIS — Z79899 Other long term (current) drug therapy: Secondary | ICD-10-CM

## 2018-09-01 DIAGNOSIS — D702 Other drug-induced agranulocytosis: Secondary | ICD-10-CM

## 2018-09-01 DIAGNOSIS — D709 Neutropenia, unspecified: Secondary | ICD-10-CM

## 2018-09-01 DIAGNOSIS — R5383 Other fatigue: Secondary | ICD-10-CM

## 2018-09-01 DIAGNOSIS — Z803 Family history of malignant neoplasm of breast: Secondary | ICD-10-CM

## 2018-09-01 DIAGNOSIS — D701 Agranulocytosis secondary to cancer chemotherapy: Secondary | ICD-10-CM

## 2018-09-01 DIAGNOSIS — K219 Gastro-esophageal reflux disease without esophagitis: Secondary | ICD-10-CM

## 2018-09-01 LAB — SAMPLE TO BLOOD BANK

## 2018-09-01 LAB — CBC WITH DIFFERENTIAL/PLATELET
Abs Immature Granulocytes: 0.05 10*3/uL (ref 0.00–0.07)
Basophils Absolute: 0 10*3/uL (ref 0.0–0.1)
Basophils Relative: 0 %
Eosinophils Absolute: 0 10*3/uL (ref 0.0–0.5)
Eosinophils Relative: 0 %
HCT: 24 % — ABNORMAL LOW (ref 36.0–46.0)
Hemoglobin: 8 g/dL — ABNORMAL LOW (ref 12.0–15.0)
Immature Granulocytes: 1 %
Lymphocytes Relative: 35 %
Lymphs Abs: 1.8 10*3/uL (ref 0.7–4.0)
MCH: 34.6 pg — ABNORMAL HIGH (ref 26.0–34.0)
MCHC: 33.3 g/dL (ref 30.0–36.0)
MCV: 103.9 fL — ABNORMAL HIGH (ref 80.0–100.0)
Monocytes Absolute: 0.5 10*3/uL (ref 0.1–1.0)
Monocytes Relative: 10 %
Neutro Abs: 2.7 10*3/uL (ref 1.7–7.7)
Neutrophils Relative %: 54 %
Platelets: 55 10*3/uL — ABNORMAL LOW (ref 150–400)
RBC: 2.31 MIL/uL — ABNORMAL LOW (ref 3.87–5.11)
RDW: 16.1 % — ABNORMAL HIGH (ref 11.5–15.5)
WBC: 5 10*3/uL (ref 4.0–10.5)
nRBC: 0 % (ref 0.0–0.2)

## 2018-09-01 LAB — COMPREHENSIVE METABOLIC PANEL
ALT: 40 U/L (ref 0–44)
AST: 28 U/L (ref 15–41)
Albumin: 4.4 g/dL (ref 3.5–5.0)
Alkaline Phosphatase: 119 U/L (ref 38–126)
Anion gap: 9 (ref 5–15)
BUN: 18 mg/dL (ref 6–20)
CO2: 23 mmol/L (ref 22–32)
Calcium: 9.3 mg/dL (ref 8.9–10.3)
Chloride: 107 mmol/L (ref 98–111)
Creatinine, Ser: 0.96 mg/dL (ref 0.44–1.00)
GFR calc Af Amer: >60 mL/min (ref >60)
GFR calc non Af Amer: >60 mL/min (ref >60)
Glucose, Bld: 94 mg/dL (ref 70–99)
Potassium: 4.9 mmol/L (ref 3.5–5.1)
Sodium: 139 mmol/L (ref 135–145)
Total Bilirubin: 0.3 mg/dL (ref 0.3–1.2)
Total Protein: 7.4 g/dL (ref 6.5–8.1)

## 2018-09-01 LAB — PREPARE RBC (CROSSMATCH)

## 2018-09-01 MED ORDER — DIPHENHYDRAMINE HCL 25 MG PO CAPS
25.0000 mg | ORAL_CAPSULE | Freq: Once | ORAL | Status: AC
  Administered 2018-09-01: 11:00:00 25 mg via ORAL

## 2018-09-01 MED ORDER — HEPARIN SOD (PORK) LOCK FLUSH 100 UNIT/ML IV SOLN
250.0000 [IU] | INTRAVENOUS | Status: DC | PRN
  Filled 2018-09-01: qty 5

## 2018-09-01 MED ORDER — ACETAMINOPHEN 325 MG PO TABS
650.0000 mg | ORAL_TABLET | Freq: Once | ORAL | Status: AC
  Administered 2018-09-01: 650 mg via ORAL

## 2018-09-01 MED ORDER — SODIUM CHLORIDE 0.9% FLUSH
10.0000 mL | INTRAVENOUS | Status: AC | PRN
  Administered 2018-09-01: 13:00:00 10 mL
  Filled 2018-09-01: qty 10

## 2018-09-01 MED ORDER — SODIUM CHLORIDE 0.9% IV SOLUTION
250.0000 mL | Freq: Once | INTRAVENOUS | Status: AC
  Administered 2018-09-01: 250 mL via INTRAVENOUS
  Filled 2018-09-01: qty 250

## 2018-09-01 MED ORDER — HEPARIN SOD (PORK) LOCK FLUSH 100 UNIT/ML IV SOLN
500.0000 [IU] | Freq: Every day | INTRAVENOUS | Status: AC | PRN
  Administered 2018-09-01: 500 [IU]
  Filled 2018-09-01: qty 5

## 2018-09-01 MED ORDER — SODIUM CHLORIDE 0.9% FLUSH
3.0000 mL | INTRAVENOUS | Status: DC | PRN
  Filled 2018-09-01: qty 10

## 2018-09-01 MED ORDER — DIPHENHYDRAMINE HCL 25 MG PO CAPS
ORAL_CAPSULE | ORAL | Status: AC
  Filled 2018-09-01: qty 1

## 2018-09-01 MED ORDER — ACETAMINOPHEN 325 MG PO TABS
ORAL_TABLET | ORAL | Status: AC
  Filled 2018-09-01: qty 2

## 2018-09-01 MED ORDER — SODIUM CHLORIDE 0.9% FLUSH
10.0000 mL | INTRAVENOUS | Status: DC | PRN
  Administered 2018-09-01: 10:00:00 10 mL
  Filled 2018-09-01: qty 10

## 2018-09-01 NOTE — Patient Instructions (Signed)

## 2018-09-01 NOTE — Patient Instructions (Signed)
Blood Transfusion, Adult, Care After This sheet gives you information about how to care for yourself after your procedure. Your doctor may also give you more specific instructions. If you have problems or questions, contact your doctor. Follow these instructions at home:   Take over-the-counter and prescription medicines only as told by your doctor.  Go back to your normal activities as told by your doctor.  Follow instructions from your doctor about how to take care of the area where an IV tube was put into your vein (insertion site). Make sure you: ? Wash your hands with soap and water before you change your bandage (dressing). If there is no soap and water, use hand sanitizer. ? Change your bandage as told by your doctor.  Check your IV insertion site every day for signs of infection. Check for: ? More redness, swelling, or pain. ? More fluid or blood. ? Warmth. ? Pus or a bad smell. Contact a doctor if:  You have more redness, swelling, or pain around the IV insertion site.  You have more fluid or blood coming from the IV insertion site.  Your IV insertion site feels warm to the touch.  You have pus or a bad smell coming from the IV insertion site.  Your pee (urine) turns pink, red, or brown.  You feel weak after doing your normal activities. Get help right away if:  You have signs of a serious allergic or body defense (immune) system reaction, including: ? Itchiness. ? Hives. ? Trouble breathing. ? Anxiety. ? Pain in your chest or lower back. ? Fever, flushing, and chills. ? Fast pulse. ? Rash. ? Watery poop (diarrhea). ? Throwing up (vomiting). ? Dark pee. ? Serious headache. ? Dizziness. ? Stiff neck. ? Yellow color in your face or the white parts of your eyes (jaundice). Summary  After a blood transfusion, return to your normal activities as told by your doctor.  Every day, check for signs of infection where the IV tube was put into your vein.  Some  signs of infection are warm skin, more redness and pain, more fluid or blood, and pus or a bad smell where the needle went in.  Contact your doctor if you feel weak or have any unusual symptoms. This information is not intended to replace advice given to you by your health care provider. Make sure you discuss any questions you have with your health care provider. Document Released: 02/22/2014 Document Revised: 06/08/2017 Document Reviewed: 09/26/2015 Elsevier Patient Education  2020 Elsevier Inc.  

## 2018-09-02 LAB — TYPE AND SCREEN
ABO/RH(D): A POS
Antibody Screen: NEGATIVE
Unit division: 0

## 2018-09-02 LAB — BPAM RBC
Blood Product Expiration Date: 202008132359
ISSUE DATE / TIME: 202007171124
Unit Type and Rh: 6200

## 2018-09-04 ENCOUNTER — Other Ambulatory Visit: Payer: 59

## 2018-09-04 ENCOUNTER — Ambulatory Visit: Payer: 59

## 2018-09-11 ENCOUNTER — Inpatient Hospital Stay: Payer: 59

## 2018-09-11 ENCOUNTER — Encounter: Payer: Self-pay | Admitting: Adult Health

## 2018-09-11 ENCOUNTER — Other Ambulatory Visit: Payer: Self-pay

## 2018-09-11 ENCOUNTER — Inpatient Hospital Stay (HOSPITAL_BASED_OUTPATIENT_CLINIC_OR_DEPARTMENT_OTHER): Payer: 59 | Admitting: Adult Health

## 2018-09-11 VITALS — BP 111/67 | HR 72 | Temp 98.7°F | Resp 18 | Ht 63.0 in | Wt 128.4 lb

## 2018-09-11 DIAGNOSIS — C5701 Malignant neoplasm of right fallopian tube: Secondary | ICD-10-CM

## 2018-09-11 DIAGNOSIS — C762 Malignant neoplasm of abdomen: Secondary | ICD-10-CM

## 2018-09-11 DIAGNOSIS — D702 Other drug-induced agranulocytosis: Secondary | ICD-10-CM

## 2018-09-11 DIAGNOSIS — T451X5A Adverse effect of antineoplastic and immunosuppressive drugs, initial encounter: Secondary | ICD-10-CM

## 2018-09-11 DIAGNOSIS — C786 Secondary malignant neoplasm of retroperitoneum and peritoneum: Secondary | ICD-10-CM

## 2018-09-11 DIAGNOSIS — R531 Weakness: Secondary | ICD-10-CM

## 2018-09-11 DIAGNOSIS — G620 Drug-induced polyneuropathy: Secondary | ICD-10-CM

## 2018-09-11 DIAGNOSIS — K5900 Constipation, unspecified: Secondary | ICD-10-CM

## 2018-09-11 DIAGNOSIS — D6481 Anemia due to antineoplastic chemotherapy: Secondary | ICD-10-CM

## 2018-09-11 DIAGNOSIS — Z5111 Encounter for antineoplastic chemotherapy: Secondary | ICD-10-CM | POA: Diagnosis not present

## 2018-09-11 DIAGNOSIS — D701 Agranulocytosis secondary to cancer chemotherapy: Secondary | ICD-10-CM

## 2018-09-11 DIAGNOSIS — D696 Thrombocytopenia, unspecified: Secondary | ICD-10-CM

## 2018-09-11 DIAGNOSIS — Z95828 Presence of other vascular implants and grafts: Secondary | ICD-10-CM

## 2018-09-11 DIAGNOSIS — Z79899 Other long term (current) drug therapy: Secondary | ICD-10-CM

## 2018-09-11 DIAGNOSIS — D709 Neutropenia, unspecified: Secondary | ICD-10-CM

## 2018-09-11 DIAGNOSIS — Z85828 Personal history of other malignant neoplasm of skin: Secondary | ICD-10-CM

## 2018-09-11 DIAGNOSIS — R5383 Other fatigue: Secondary | ICD-10-CM

## 2018-09-11 DIAGNOSIS — Z8249 Family history of ischemic heart disease and other diseases of the circulatory system: Secondary | ICD-10-CM

## 2018-09-11 DIAGNOSIS — K219 Gastro-esophageal reflux disease without esophagitis: Secondary | ICD-10-CM

## 2018-09-11 DIAGNOSIS — R188 Other ascites: Secondary | ICD-10-CM

## 2018-09-11 DIAGNOSIS — Z7189 Other specified counseling: Secondary | ICD-10-CM

## 2018-09-11 DIAGNOSIS — R1011 Right upper quadrant pain: Secondary | ICD-10-CM

## 2018-09-11 DIAGNOSIS — Z803 Family history of malignant neoplasm of breast: Secondary | ICD-10-CM

## 2018-09-11 LAB — COMPREHENSIVE METABOLIC PANEL
ALT: 26 U/L (ref 0–44)
AST: 32 U/L (ref 15–41)
Albumin: 4.6 g/dL (ref 3.5–5.0)
Alkaline Phosphatase: 86 U/L (ref 38–126)
Anion gap: 8 (ref 5–15)
BUN: 16 mg/dL (ref 6–20)
CO2: 25 mmol/L (ref 22–32)
Calcium: 9.6 mg/dL (ref 8.9–10.3)
Chloride: 106 mmol/L (ref 98–111)
Creatinine, Ser: 1.03 mg/dL — ABNORMAL HIGH (ref 0.44–1.00)
GFR calc Af Amer: >60 mL/min (ref >60)
GFR calc non Af Amer: >60 mL/min (ref >60)
Glucose, Bld: 101 mg/dL — ABNORMAL HIGH (ref 70–99)
Potassium: 4.6 mmol/L (ref 3.5–5.1)
Sodium: 139 mmol/L (ref 135–145)
Total Bilirubin: 0.4 mg/dL (ref 0.3–1.2)
Total Protein: 7.9 g/dL (ref 6.5–8.1)

## 2018-09-11 LAB — CBC WITH DIFFERENTIAL/PLATELET
Abs Immature Granulocytes: 0.01 10*3/uL (ref 0.00–0.07)
Basophils Absolute: 0 10*3/uL (ref 0.0–0.1)
Basophils Relative: 0 %
Eosinophils Absolute: 0 10*3/uL (ref 0.0–0.5)
Eosinophils Relative: 0 %
HCT: 31.6 % — ABNORMAL LOW (ref 36.0–46.0)
Hemoglobin: 10.3 g/dL — ABNORMAL LOW (ref 12.0–15.0)
Immature Granulocytes: 0 %
Lymphocytes Relative: 22 %
Lymphs Abs: 1.1 10*3/uL (ref 0.7–4.0)
MCH: 34.2 pg — ABNORMAL HIGH (ref 26.0–34.0)
MCHC: 32.6 g/dL (ref 30.0–36.0)
MCV: 105 fL — ABNORMAL HIGH (ref 80.0–100.0)
Monocytes Absolute: 0.5 10*3/uL (ref 0.1–1.0)
Monocytes Relative: 9 %
Neutro Abs: 3.5 10*3/uL (ref 1.7–7.7)
Neutrophils Relative %: 69 %
Platelets: 251 10*3/uL (ref 150–400)
RBC: 3.01 MIL/uL — ABNORMAL LOW (ref 3.87–5.11)
RDW: 17.6 % — ABNORMAL HIGH (ref 11.5–15.5)
WBC: 5.1 10*3/uL (ref 4.0–10.5)
nRBC: 0 % (ref 0.0–0.2)

## 2018-09-11 LAB — TYPE AND SCREEN
ABO/RH(D): A POS
Antibody Screen: NEGATIVE

## 2018-09-11 MED ORDER — SODIUM CHLORIDE 0.9% FLUSH
10.0000 mL | INTRAVENOUS | Status: DC | PRN
  Administered 2018-09-11: 10 mL
  Filled 2018-09-11: qty 10

## 2018-09-11 MED ORDER — SODIUM CHLORIDE 0.9 % IV SOLN
800.0000 mg/m2 | Freq: Once | INTRAVENOUS | Status: AC
  Administered 2018-09-11: 1292 mg via INTRAVENOUS
  Filled 2018-09-11: qty 33.98

## 2018-09-11 MED ORDER — PEGFILGRASTIM 6 MG/0.6ML ~~LOC~~ PSKT
PREFILLED_SYRINGE | SUBCUTANEOUS | Status: AC
  Filled 2018-09-11: qty 0.6

## 2018-09-11 MED ORDER — DEXAMETHASONE SODIUM PHOSPHATE 10 MG/ML IJ SOLN
10.0000 mg | Freq: Once | INTRAMUSCULAR | Status: AC
  Administered 2018-09-11: 10 mg via INTRAVENOUS

## 2018-09-11 MED ORDER — PEGFILGRASTIM 6 MG/0.6ML ~~LOC~~ PSKT
6.0000 mg | PREFILLED_SYRINGE | Freq: Once | SUBCUTANEOUS | Status: AC
  Administered 2018-09-11: 6 mg via SUBCUTANEOUS

## 2018-09-11 MED ORDER — DEXAMETHASONE SODIUM PHOSPHATE 10 MG/ML IJ SOLN
INTRAMUSCULAR | Status: AC
  Filled 2018-09-11: qty 1

## 2018-09-11 MED ORDER — PALONOSETRON HCL INJECTION 0.25 MG/5ML
INTRAVENOUS | Status: AC
  Filled 2018-09-11: qty 5

## 2018-09-11 MED ORDER — SODIUM CHLORIDE 0.9 % IV SOLN
Freq: Once | INTRAVENOUS | Status: AC
  Administered 2018-09-11: 14:00:00 via INTRAVENOUS
  Filled 2018-09-11: qty 250

## 2018-09-11 MED ORDER — SODIUM CHLORIDE 0.9 % IV SOLN
170.0000 mg | Freq: Once | INTRAVENOUS | Status: AC
  Administered 2018-09-11: 170 mg via INTRAVENOUS
  Filled 2018-09-11: qty 17

## 2018-09-11 MED ORDER — FAMOTIDINE IN NACL 20-0.9 MG/50ML-% IV SOLN
INTRAVENOUS | Status: AC
  Filled 2018-09-11: qty 50

## 2018-09-11 MED ORDER — PALONOSETRON HCL INJECTION 0.25 MG/5ML
0.2500 mg | Freq: Once | INTRAVENOUS | Status: AC
  Administered 2018-09-11: 0.25 mg via INTRAVENOUS

## 2018-09-11 MED ORDER — HEPARIN SOD (PORK) LOCK FLUSH 100 UNIT/ML IV SOLN
500.0000 [IU] | Freq: Once | INTRAVENOUS | Status: AC | PRN
  Administered 2018-09-11: 500 [IU]
  Filled 2018-09-11: qty 5

## 2018-09-11 MED ORDER — DIPHENHYDRAMINE HCL 50 MG/ML IJ SOLN
INTRAMUSCULAR | Status: AC
  Filled 2018-09-11: qty 1

## 2018-09-11 MED ORDER — FAMOTIDINE IN NACL 20-0.9 MG/50ML-% IV SOLN
20.0000 mg | Freq: Once | INTRAVENOUS | Status: AC
  Administered 2018-09-11: 20 mg via INTRAVENOUS

## 2018-09-11 MED ORDER — DIPHENHYDRAMINE HCL 50 MG/ML IJ SOLN
25.0000 mg | Freq: Once | INTRAMUSCULAR | Status: AC
  Administered 2018-09-11: 25 mg via INTRAVENOUS

## 2018-09-11 NOTE — Patient Instructions (Signed)

## 2018-09-11 NOTE — Patient Instructions (Signed)
[facility] Discharge Instructions for Patients Receiving Chemotherapy  Today you received the following chemotherapy agents: Gemzar/Carboplatin.  To help prevent nausea and vomiting after your treatment, we encourage you to take your nausea medication as directed.   If you develop nausea and vomiting that is not controlled by your nausea medication, call the clinic.   BELOW ARE SYMPTOMS THAT SHOULD BE REPORTED IMMEDIATELY:  *FEVER GREATER THAN 100.5 F  *CHILLS WITH OR WITHOUT FEVER  NAUSEA AND VOMITING THAT IS NOT CONTROLLED WITH YOUR NAUSEA MEDICATION  *UNUSUAL SHORTNESS OF BREATH  *UNUSUAL BRUISING OR BLEEDING  TENDERNESS IN MOUTH AND THROAT WITH OR WITHOUT PRESENCE OF ULCERS  *URINARY PROBLEMS  *BOWEL PROBLEMS  UNUSUAL RASH Items with * indicate a potential emergency and should be followed up as soon as possible.  Feel free to call the clinic should you have any questions or concerns. The clinic phone number is [phone].  Please show the CHEMO ALERT CARD at check-in to the Emergency Department and triage nurse.   

## 2018-09-11 NOTE — Progress Notes (Signed)
°[facility]  °[phone] [phone]  ° ° °ID: Kathryn Ramsey DOB: 01/19/1966  MR#: [id number]  CSN#:[id number] ° °Patient Care Team: °White, Cynthia, MD as PCP - General (Family Medicine) °Rossi, Emma, MD as Consulting Physician (Obstetrics and Gynecology) °Mann, Jyothi, MD as Consulting Physician (Gastroenterology) °Cousins, Sheronette, MD as Consulting Physician (Obstetrics and Gynecology) °Budzyn, Brian James, MD as Consulting Physician (Urology) °Secord, Angeles Alvarez, MD as Referring Physician (Obstetrics and Gynecology) °OTHER MD: Charles Levenback (FAX [phone]) Keith Willis ° ° °CHIEF COMPLAINT:  ovarian cancer ° °CURRENT TREATMENT: carboplatin, gemcitabine; Aranesp;  ° ° °INTERVAL HISTORY: °Kathryn Ramsey returns today for follow-up and treatment of her recurrent ovarian cancer. ° °She continues on chemotherapy currently consisting of carboplatin and gemcitabinegiven every 14 days, with onpro support. Today is day 1 cycle 3. We held her chemotherapy on Friday, 7/17 due to thrombocytopenia, anemia, and fatigue.  Instead she received one unit of blood.   ° °REVIEW OF SYSTEMS: °Kathryn Ramsey is feeling well.  She enjoyed her trip to [place] last week.  She still has issues with neuropathy.  She has not fallen, but is working to stabilize everything and is struggling with this.  She has intermittent pain from the neuropathy.  She is taking Gabapentin and it helps her sleep.  She is prescribed 200mg TID, however she only takes 2 at night because she doesn't want to get too sleepy throughout the day.  She notes the neuropathy isn't as bad in the mornings.   ° °Kathryn Ramsey is doing well otherwise.  She denies any fever or chills. She is without chest pain, palpitations, cough, shortness of breath.  She has no bowel/bladder issues.  She still has some bloating and occasional discomfort in her RUQ.  A detailed ROS was otherwise non contributory.   ° ° °HISTORY OF PRESENT ILLNESS: °From Dr. 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 The patient has a history of a workup by urology for gross hematuria which included a pelvic examine was suggested for extrinsic mass effect on the bladder on cystoscopy. Cystoscopy took place on in December 2016. The patient denies abdominal pain bloating early satiety or abdominal distention. °  °On 08/03/2015 at [facility] she underwent a CT scan of the abdomen and pelvis as ordered by Dr Brian Budzyn. This revealed a 1.4 cm low attenuation lesion on the posterior right hepatic lobe suggestive of a hemangioma. Status post hysterectomy. No ovarian masses. Moderate ascites. Omental caking seen in the lateral left abdomen and pelvis. Peritoneal nodularity in the left lateral pelvic cul-de-sac. No gross extrinsic compression on the bladder was identified on imaging. °  °The patient was then seen by her gastroenterologist, Dr. Mann, who performed a colonoscopy which was unremarkable. °  °Tumor markers were drawn on 08/04/2015 and these included a CA-125 that was elevated to 957, and a CEA that was normal at 1." ° °On 03/27/2015 the patient underwent diagnostic laparoscopy, exploratory laparotomy with bilateral salpingo-oophorectomy, omentectomy, and radical tumor debulking with optimal cytoreduction (R0) of what proved to be a stage IIIc right fallopian tube cancer. She was treated adjuvantly with carboplatin and paclitaxel, as detailed below. Her CA 125 dropped from 1226 on 03/20/2015 to 26.1 by 06/16/2015. ° °Her subsequent history is as detailed above. 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 (had surgery at 5 years and had reaction to the ether  °• Heart murmur   ° pt, states had a "working heart murmur"  °• History of kidney stones   °• Neutropenia, drug-induced (HCC) 03/10/2018  °• PONV (postoperative nausea and vomiting)   °• Skin cancer   ° ° °PAST SURGICAL HISTORY: °Past Surgical History:  °Procedure Laterality Date  °• 3 laporscopic proceedures    °• ABDOMINAL HYSTERECTOMY    ° 2010  °• CHOLECYSTECTOMY    ° 2010  °• DILATION AND CURETTAGE OF UTERUS    ° 1997  °• IR IMAGING GUIDED PORT INSERTION  03/08/2018  °• LAPAROSCOPY N/A 03/27/2015  ° Procedure: DIAGNOSTIC LAPAROSCOPY ;  Surgeon: Emma Rossi, MD;  Location: [facility];  Service: Gynecology;  Laterality: N/A;  °• LAPAROSCOPY N/A 02/24/2016  ° Procedure: LAPAROSCOPY DIAGNOSTIC WITH PERITONEAL WASHINGS AND PERITONEAL BIOPSY;  Surgeon: Emma Rossi, MD;  Location: [facility];  Service: Gynecology;  Laterality: N/A;  °• LAPAROTOMY N/A 03/27/2015  ° Procedure: EXPLORATORY LAPAROTOMY, BILATERAL SALPINGO OOPHORECTOMY, OMENTECTOMY, RADICAL TUMOR DEBULKING;  Surgeon: Emma Rossi, MD;  Location: [facility];  Service: Gynecology;  Laterality: N/A;  °• sinus surgery 1994    ° ° °FAMILY HISTORY °Family History  °Problem Relation Age of Onset  °• Hypertension Father   °• Skin cancer Father   °     nonmelanoma skin cancers in his late 80s  °• Non-Hodgkin's lymphoma Maternal Aunt   °     dx. 60s; smoker  °• Stroke Maternal Grandmother   °• Other Mother   °     benign meningioma dx. early-mid-70s; hysterectomy in her late 40s for heavy periods - still has ovaries  °• Other Son   °     one son with pre-cancerous skin findings  °• Other Daughter   °     cysts on ovaries and hx of heavy periods  °• Other Sister   °     hysterectomy for cysts - still has ovaries  °• Heart attack Maternal Grandfather   °• Heart attack Paternal Grandfather   °• Renal cancer Maternal Aunt 60  °     smoker  °• Breast cancer Maternal Aunt 78  °• Other  Maternal Aunt   °     dx. benign brain tumor (meningioma) at 30; 2nd benign brain tumor in her late 70s; hx of radical hysterectomy at age 38  °• Brain cancer Other   °     NOS tumor  °The patient's parents are both living, in their late 80s as of January 2018. The patient has one brother, 3 sisters. There is no history of ovarian cancer in the family. One maternal aunt had breast and kidney cancer, another had non-Hodgkin's lymphoma. ° ° °GYNECOLOGIC HISTORY:  °No LMP recorded. Patient has had a hysterectomy. 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 Her husband Tom is a CPA. Their children are 27, 25, 22 and 21 y/o as of JAN 2018. The oldest works as an advisor in the [employer] in [place]. The other 3 children live in [place], the   live in [place], the youngest currently attending [employer]. The patient has no grandchildren. She attends [employer]   ADVANCED DIRECTIVES:    HEALTH MAINTENANCE: Social History   Tobacco Use   Smoking status: Never Smoker   Smokeless tobacco: Never Used  Substance Use Topics   Alcohol use: Yes    Comment:  3 glasses wine or beer/week   Drug use: No    Colonoscopy:2016  PAP: s/p hyst  Bone density:   Allergies  Allergen Reactions   Bee Venom Shortness Of Breath    swelling   Compazine [Prochlorperazine Edisylate] Anaphylaxis, Anxiety and Palpitations   Sulfa Antibiotics Shortness Of Breath    Vomit , diarrhea , hives   Zarxio [Filgrastim]     Current Outpatient Medications  Medication Sig Dispense Refill   fluticasone (FLONASE) 50 MCG/ACT nasal spray Place 1 spray into both nostrils 2 (two) times daily. (Patient taking differently: Place 1 spray into both nostrils as needed. ) 16 g 2   gabapentin (NEURONTIN) 100 MG capsule Take 2 capsules (200 mg total) by mouth 3 (three) times daily. 540 capsule 1   lidocaine-prilocaine (EMLA)  cream APPLY TOPICALLY AS NEEDED. 30 g 0   loratadine (CLARITIN) 10 MG tablet Take 10 mg by mouth as needed.      magnesium oxide (MAG-OX) 400 (241.3 Mg) MG tablet Take 1 tablet (400 mg total) by mouth daily. 60 tablet 3   omeprazole (PRILOSEC) 40 MG capsule Take 1 capsule (40 mg total) by mouth daily. 30 capsule 5   sertraline (ZOLOFT) 50 MG tablet TAKE 1 TABLET ONCE DAILY. 30 tablet 0   valACYclovir (VALTREX) 1000 MG tablet Take 1 tablet (1,000 mg total) by mouth daily. 5 tablet 0   diphenhydrAMINE (BENADRYL) 50 MG capsule 50 mg Diphenhydramine (Benadryl) PO within 1 hour of the injection 1 capsule 0   No current facility-administered medications for this visit.    Facility-Administered Medications Ordered in Other Visits  Medication Dose Route Frequency Provider Last Rate Last Dose   heparin lock flush 100 unit/mL  500 Units Intravenous Once Magrinat, Virgie Dad, MD       sodium chloride flush (NS) 0.9 % injection 10 mL  10 mL Intravenous PRN Magrinat, Virgie Dad, MD        OBJECTIVE:  Vitals:   09/11/18 1303  BP: 111/67  Pulse: 72  Resp: 18  Temp: 98.7 F (37.1 C)  SpO2: 100%     Body mass index is 22.75 kg/m.   ECOG FS:1 - Symptomatic but completely ambulatory GENERAL: Patient isa well appearing female in no acute distress HEENT:  Sclerae anicteric.  Oropharynx clear and moist. No ulcerations or evidence of oropharyngeal candidiasis. Neck is supple.  NODES:  No cervical, supraclavicular, or axillary lymphadenopathy palpated.  LUNGS:  Clear to auscultation bilaterally.  No wheezes or rhonchi. HEART:  Regular rate and rhythm. No murmur appreciated. ABDOMEN:  Soft, nontender.  Positive, normoactive bowel sounds. No organomegaly palpated. MSK:  No focal spinal tenderness to palpation. Gait is normal. EXTREMITIES:  No peripheral edema.   SKIN:  Clear with no obvious rashes or skin changes. No nail dyscrasia. NEURO:  Nonfocal. Well oriented.  Appropriate affect.   LAB  RESULTS:  CMP     Component Value Date/Time   NA 139 09/01/2018 0917   NA 140 02/11/2017 0755   K 4.9 09/01/2018 0917   K 4.4 02/11/2017 0755   CL 107 09/01/2018 0917   CO2 23 09/01/2018 0917   CO2  GLUCOSE 94 09/01/2018 0917  ° GLUCOSE 93 02/11/2017 0755  ° BUN 18 09/01/2018 0917  ° BUN 7.9 02/11/2017 0755  ° CREATININE 0.96 09/01/2018 0917  ° CREATININE 1.06 (H) 06/19/2018 0931  ° CREATININE 0.7 02/11/2017 0755  ° CALCIUM 9.3 09/01/2018 0917  ° CALCIUM 9.6 02/11/2017 0755  ° PROT 7.4 09/01/2018 0917  ° PROT 7.5 02/11/2017 0755  ° ALBUMIN 4.4 09/01/2018 0917  ° ALBUMIN 4.4 02/11/2017 0755  ° AST 28 09/01/2018 0917  ° AST 22 06/19/2018 0931  ° AST 27 02/11/2017 0755  ° ALT 40 09/01/2018 0917  ° ALT 13 06/19/2018 0931  ° ALT 24 02/11/2017 0755  ° ALKPHOS 119 09/01/2018 0917  ° ALKPHOS 58 02/11/2017 0755  ° BILITOT 0.3 09/01/2018 0917  ° BILITOT 0.2 (L) 06/19/2018 0931  ° BILITOT 0.43 02/11/2017 0755  ° GFRNONAA >60 09/01/2018 0917  ° GFRNONAA 59 (L) 06/19/2018 0931  ° GFRAA >60 09/01/2018 0917  ° GFRAA >60 06/19/2018 0931  ° ° °INo results found for: SPEP, UPEP ° °Lab Results  °Component Value Date  ° WBC 5.1 09/11/2018  ° NEUTROABS 3.5 09/11/2018  ° HGB 10.3 (L) 09/11/2018  ° HCT 31.6 (L) 09/11/2018  ° MCV 105.0 (H) 09/11/2018  ° PLT 251 09/11/2018  ° ° °  Chemistry   °   °Component Value Date/Time  ° NA 139 09/01/2018 0917  ° NA 140 02/11/2017 0755  ° K 4.9 09/01/2018 0917  ° K 4.4 02/11/2017 0755  ° CL 107 09/01/2018 0917  ° CO2 23 09/01/2018 0917  ° CO2 26 02/11/2017 0755  ° BUN 18 09/01/2018 0917  ° BUN 7.9 02/11/2017 0755  ° CREATININE 0.96 09/01/2018 0917  ° CREATININE 1.06 (H) 06/19/2018 0931  ° CREATININE 0.7 02/11/2017 0755  °    °Component Value Date/Time  ° CALCIUM 9.3 09/01/2018 0917  ° CALCIUM 9.6 02/11/2017 0755  ° ALKPHOS 119 09/01/2018 0917  ° ALKPHOS 58 02/11/2017 0755  ° AST 28 09/01/2018 0917  ° AST 22 06/19/2018 0931  ° AST 27 02/11/2017 0755  ° ALT 40 09/01/2018  0917  ° ALT 13 06/19/2018 0931  ° ALT 24 02/11/2017 0755  ° BILITOT 0.3 09/01/2018 0917  ° BILITOT 0.2 (L) 06/19/2018 0931  ° BILITOT 0.43 02/11/2017 0755  °  ° ° ° °No results found for: LABCA2 ° °No components found for: LABCA125 ° °No results for input(s): INR in the last 168 hours. ° °Urinalysis °   °Component Value Date/Time  ° COLORURINE STRAW (A) 11/22/2017 0940  ° APPEARANCEUR CLEAR 11/22/2017 0940  ° LABSPEC 1.002 (L) 11/22/2017 0940  ° LABSPEC 1.005 10/09/2015 1239  ° PHURINE 7.0 11/22/2017 0940  ° GLUCOSEU NEGATIVE 11/22/2017 0940  ° GLUCOSEU Negative 10/09/2015 1239  ° HGBUR NEGATIVE 11/22/2017 0940  ° BILIRUBINUR NEGATIVE 11/22/2017 0940  ° BILIRUBINUR Negative 10/09/2015 1239  ° KETONESUR NEGATIVE 11/22/2017 0940  ° PROTEINUR NEGATIVE 11/22/2017 0940  ° UROBILINOGEN 0.2 10/09/2015 1239  ° NITRITE NEGATIVE 11/22/2017 0940  ° LEUKOCYTESUR NEGATIVE 11/22/2017 0940  ° LEUKOCYTESUR Negative 10/09/2015 1239  ° ° °STUDIES: °No results found. ° °ELIGIBLE FOR AVAILABLE RESEARCH PROTOCOL:  no ° °ASSESSMENT: 53 y.o. BRCA negative Kathryn Ramsey status post radical tumor debulking with optimal cytoreduction (R0) 03/27/2015 for a stage IIIC, high-grade right fallopian tube carcinoma ° (a) baseline CA-125 was 1226. 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  (c.1347A>G (p.Lys449Lys)  (c) tumor is PD-L1 negative (02/24/2016)  (d) tumor is strongly estrogen receptor positive, progesterone receptor negative (02/24/2016)  (1) adjuvant chemotherapy consisted of carboplatin and paclitaxel for 6 doses, begun 04/14/2015, completed 08/11/2015  (a) paclitaxel was omitted from cycle 5 and dose reduced on cycle 6 because of neuropathy   (b) a "make up" dose of paclitaxel was given 08/25/2015  (c) last carboplatin dose was 08/11/2015  (d) CA-125 normalized by  06/16/2015   (2) FIRST RECURRENCE: January 2018  (a) CA-125 rise beginning November 2017 led to CT scans and PET scans December 2017, all negative  (b) exploratory laparotomy 02/24/2016 showed pathologically confirmed recurrence, with miliary disease involving all examined surfaces  (c) port-site metastases noted 03/08/2016  (d) the January 2018 tumor sample was tested for the estrogen receptor and he was 80 percent positive with strong staining, progesterone receptor negative  (3) carboplatin/liposomal doxorubicin started 03/05/2016, repeated every three weeks x4, completed 06/04/2016.  (a) cycle 3 delayed one week because of neutropenia; OnPro added  (b) CA 125 normalized after cycle 3  (4) RUCAPARIB maintenance started 07/02/2016  (a) restaging 10/01/2016: Normal CA 125, negative CT of the abdomen and pelvis  (b) labs 11/24/2016 shows a rise in her CA 125-43.4, with continuing rise thereafter  (c) rucaparib discontinued October 2018 with rising CA 125  (5) anastrozole started December 23, 2016-stopped after a couple of weeks with poor tolerance  SECOND RECURRENCE: (6) Gemcitabine/Bevacizumab started on 02/04/2017  (a) gemcitabine omitted cycle 4 because of intercurrent infection  (b) gemcitabine resumed with cycle 5, with intervening rise in  Ca 125  (c) repeat CT scan of the abdomen and pelvis on 05/26/2017 does not confirm obvious disease progression  (d) cycle 5 of gemcitabine/bevacizumab delayed 1 week   (e) gemcitabine/ bevacizumab discontinued after 6 cycles, with rising CA 125 (last dose 06/24/2017)  (7) foundation one testing did not show a high mutation burden and the microsatellite status was stable.  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  These were not immediately actionable  (8) Abraxane started 07/12/2017, given weekly or weeks 1 and 2 of each 3 weeks cycle, last dose 11/15/2017 (5 cycles)  (a) cycle 2 started 08/09/2017, will be day 1 day 8 only  (b) cycle 3  scheduled to start 09/13/2017, day 1 day 8 at patient request  (c) CT of the abdomen and pelvis 11/28/2017 shows no measurable disease  (d) PET scan 01/25/2018 and MRI of the pelvis 02/03/2018 show no measurable disease  (e) patient has symptomatic ascites and a rapidly rising Ca1 25  (9) started cisplatin/gemcitabine days 1 and 8 of each 21-day cycle on 02/18/2018  (a) switched to carboplatin/ gemcitabine 07/17/2018 due to neuropathy  (b) carbo/gemzar changed to Q14 days 07/24/2018 due to neutropenia  (10) associated problems  (a) anemia due to chemotherapy: On Aranesp  (b) peripheral neuropathy due to chemotherapy: On gabapentin  (11) to start niraparib/Zejula 100 mg, 2 tabs daily at the completion of chemotherapy   PLAN: Kathryn Ramsey is doing well today.  Her labs are much improved and she will receive Gemcitabine/Carboplatin today with onpro support.  I am happy that she felt good and was able to enjoy her time with her family last week at the beach.  Kathryn Ramsey and I reviewed her peripheral neuropathy.  Right now, she feels that 261m at night time is all that she can tolerate from the Gabapentin.  I  referred her to PT to see if they could help with her treatment.    Kathryn Ramsey will return on 09/21/2018 for restaging PET scan.  She will see Dr. Jana Hakim on 09/25/2018 for labs, f/u, and her next treatment.  She was recommended to continue with the appropriate pandemic precautions. She knows to call for any questions or concerns prior to her next appointment with [name].   A total of (20) minutes of face-to-face time was spent with this patient with greater than 50% of that time in counseling and care-coordination.   Wilber Bihari, NP  09/11/18 1:11 PM [facility] [address] [place], [place] [address] Tel. [phone]    Fax. [phone]

## 2018-09-12 ENCOUNTER — Telehealth: Payer: Self-pay | Admitting: Oncology

## 2018-09-12 LAB — CA 125: Cancer Antigen (CA) 125: 258 U/mL — ABNORMAL HIGH (ref 0.0–38.1)

## 2018-09-12 NOTE — Telephone Encounter (Signed)
I talk with patient regarding schedule  

## 2018-09-15 ENCOUNTER — Other Ambulatory Visit: Payer: 59

## 2018-09-15 ENCOUNTER — Ambulatory Visit: Payer: 59

## 2018-09-15 ENCOUNTER — Ambulatory Visit: Payer: 59 | Admitting: Oncology

## 2018-09-18 ENCOUNTER — Ambulatory Visit: Payer: 59

## 2018-09-18 ENCOUNTER — Other Ambulatory Visit: Payer: 59

## 2018-09-19 ENCOUNTER — Ambulatory Visit: Payer: 59 | Attending: Adult Health | Admitting: Physical Therapy

## 2018-09-19 ENCOUNTER — Encounter: Payer: Self-pay | Admitting: Physical Therapy

## 2018-09-19 ENCOUNTER — Other Ambulatory Visit: Payer: Self-pay

## 2018-09-19 DIAGNOSIS — G620 Drug-induced polyneuropathy: Secondary | ICD-10-CM

## 2018-09-19 DIAGNOSIS — T451X5A Adverse effect of antineoplastic and immunosuppressive drugs, initial encounter: Secondary | ICD-10-CM | POA: Insufficient documentation

## 2018-09-19 DIAGNOSIS — M6281 Muscle weakness (generalized): Secondary | ICD-10-CM | POA: Diagnosis not present

## 2018-09-19 DIAGNOSIS — M62838 Other muscle spasm: Secondary | ICD-10-CM | POA: Diagnosis present

## 2018-09-19 DIAGNOSIS — R262 Difficulty in walking, not elsewhere classified: Secondary | ICD-10-CM | POA: Diagnosis present

## 2018-09-19 NOTE — Therapy (Signed)
[place], [place], [address] Phone: [phone]   Fax:  [phone]  Physical Therapy Evaluation  Patient Details  Name: Kathryn Ramsey MRN: [id number] Date of Birth: [birth year] Referring Provider (PT): Causey   Encounter Date: 09/19/2018  PT End of Session - 09/19/18 1213    Visit Number  1    Number of Visits  11    Date for PT Re-Evaluation  10/31/18    PT Start Time  1128    PT Stop Time  1207    PT Time Calculation (min)  39 min    Activity Tolerance  Patient tolerated treatment well    Behavior During Therapy  [facility] for tasks assessed/performed       Past Medical History:  Diagnosis Date  . Acute sensory neuropathy 06/26/2015  . Allergy   . Anemia   . Asthma    illness induced asthma  . Blood transfusion without reported diagnosis    at age 53 years old D/T surgery to femur being crushed  . 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 Heart murmur    pt, states had a "working heart murmur"  . History of kidney stones   . Neutropenia, drug-induced ([place]) 03/10/2018  . PONV (postoperative nausea and vomiting)   . Skin cancer     Past Surgical History:  Procedure Laterality Date  . 3 laporscopic proceedures    . ABDOMINAL HYSTERECTOMY     2010  . CHOLECYSTECTOMY     2010  . DILATION AND CURETTAGE OF UTERUS     1997  . IR IMAGING GUIDED PORT INSERTION  03/08/2018  . LAPAROSCOPY N/A 03/27/2015   Procedure: DIAGNOSTIC LAPAROSCOPY ;  Surgeon: [name], MD;  Location: [facility];  Service: Gynecology;  Laterality: N/A;  . LAPAROSCOPY N/A 02/24/2016   Procedure: LAPAROSCOPY DIAGNOSTIC WITH PERITONEAL WASHINGS AND PERITONEAL BIOPSY;  Surgeon: [name], MD;  Location: [facility];  Service: Gynecology;  Laterality: N/A;  . LAPAROTOMY N/A 03/27/2015   Procedure: EXPLORATORY LAPAROTOMY, BILATERAL SALPINGO OOPHORECTOMY, OMENTECTOMY, RADICAL  TUMOR DEBULKING;  Surgeon: [name], MD;  Location: [facility];  Service: Gynecology;  Laterality: N/A;  . sinus surgery 1994      There were no vitals filed for this visit.   Subjective Assessment - 09/19/18 1130    Subjective  I was on cisplatin and that might have been the culprit of this neuropathy. I have been on and off of chemo for 3.5 years. I used to get tingling in my feet and it used to go away and unfortunately this year it did not go away it just stays. The tingling and numbness is all the time. It is constant. It hurts and makes it hard to go to sleep.    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  Yes    Pain Score  6     Pain Location  Hand    Pain Orientation  Right;Left    Pain Descriptors / Indicators  Tingling;Pins and needles    Pain Type  Neuropathic pain    Pain Onset  More than a month ago    Pain Frequency  Constant    Aggravating Factors   nothing    Pain Relieving Factors  nothing    Effect of Pain on Daily Activities  hard to write, hard to use computer    Multiple Pain Sites  Yes    Pain Score  6    Pain Location  Foot    Pain Orientation  Right;Left    Pain Descriptors / Indicators  Pins and needles;Tingling;Numbness    Pain Type  Neuropathic pain    Pain Radiating Towards  towards knee    Pain Onset  More than a month ago    Pain Frequency  Constant    Aggravating Factors   nothing    Pain Relieving Factors  nothing    Effect of Pain on Daily Activities  very painful         [facility] PT Assessment - 09/19/18 0001      Assessment   Medical Diagnosis  R fallopiantube cancer    Referring Provider (PT)  Causey    Onset Date/Surgical Date  03/27/15    Hand Dominance  Right    Prior Therapy  none       Precautions   Precautions  Other (comment)    Precaution Comments  peripheral neuropathy      Restrictions   Weight Bearing Restrictions  No      Balance Screen   Has the patient fallen in the past 6 months  No    Has the patient had a decrease in activity level because of a fear of falling?   Yes    Is the patient reluctant to leave their home because of a fear of falling?   No      Home Film/video editor residence    Living Arrangements  Spouse/significant other    Available Help at Discharge  Family    Type of [place] to enter    Entrance Stairs-Number of Steps  2    Entrance Stairs-Rails  None    Home Layout  Two level    Alternate Level Stairs-Number of Steps  14    Alternate Level Stairs-Rails  Right      Prior Function   Level of Independence  Independent    Vocation  Part time employment    Contractor, computer work    Leisure  pt walks 1-2 days/wk for about 2 miles      [employer]   Overall Cognitive Status  Within Functional Limits for tasks assessed      Sensation   Light Touch  Appears Intact      ROM / Strength   AROM / PROM / Strength  AROM;Strength      AROM   Overall AROM   Within functional limits for tasks performed      Strength   Overall Strength  Within functional limits for tasks performed   5/5 grossly throughout    Overall Strength Comments  L hamstrings 4/5, R dorsiflexion      Transfers   Comments  30 sec sit to stand: 18 reps                Objective measurements completed on examination: See above findings.                   PT Long Term Goals - 09/19/18 1220      PT LONG TERM  GOAL #1   Title  Pt will report a 50% improvement in pain and tingling in bilateral feet to allow improved comfort    Time  6    Period  Weeks    Status  New    Target Date  10/31/18      PT LONG TERM GOAL #2   Title  Pt will  demonstrate 5/5 left hamstring strength to allow pt to return to PLOF    Baseline  4/5    Time  6    Period  Weeks    Status  New    Target Date  10/31/18      PT LONG TERM GOAL #3   Title  Pt will demonstrate 5/5 R dorisflexor strength to decrase fall risk    Baseline  4/5    Time  6    Period  Weeks    Status  New    Target Date  10/31/18      PT LONG TERM GOAL #4   Title  Pt will be independent in a home exercise program for continued strengthening and stretching    Time  6    Period  Weeks    Status  New    Target Date  10/31/18             Plan - 09/19/18 1214    Clinical Impression Statement  Pt presents to PT with peripheral neuropathy following over 3.5 years of on and off chemotherapy for treatment of R fallopian tube cancer with multiple recurrences. She was recently switches from cisplatin to carboplatin and is hoping this will help. Educated pt about the benefits of ice, heat, massage and e stim fr peripheral neuropathy. Also educated about benefits of weight bearing exercises to help decrease pain and sensitivity. Pt would benefit from skilled PT services to improve strength of L hamstring, R dorsiflexors and decrease pain in hands and feet from peripheral neuropathy.    Personal Factors and Comorbidities  Time since onset of injury/illness/exacerbation;Comorbidity 1    Comorbidities  cancer with multiple recurrences    Examination-Participation Restrictions  Community Activity    Stability/Clinical Decision Making  Evolving/Moderate complexity    Clinical Decision Making  Moderate    Rehab Potential  Good    PT Frequency  2x / week    PT Duration  6 weeks    PT Treatment/Interventions  ADLs/Self Care Home Management;Electrical Stimulation;Moist Heat;Cryotherapy;Therapeutic exercise;Balance training;Therapeutic activities;Patient/family education;Manual techniques    PT Next Visit Plan  begin weight bearing exercises for L hamstring, R d/f, do bike, high level  balance activities, try estim to feet    Recommended Other Services  purchasing a heated foot massager    Consulted and Agree with Plan of Care  Patient       Patient will benefit from skilled therapeutic intervention in order to improve the following deficits and impairments:  Impaired sensation, Decreased strength, Pain  Visit Diagnosis: 1. Muscle weakness (generalized)   2. Peripheral neuropathy due to chemotherapy ([place])   3. Difficulty in walking, not elsewhere classified        Problem List Patient Active Problem List   Diagnosis Date Noted  . Neuropathy due to chemotherapeutic drug ([place]) 07/24/2018  . Anemia due to antineoplastic chemotherapy 05/22/2018  . Port-A-Cath in place 05/01/2018  . Neutropenia, drug-induced ([place]) 03/10/2018  . Diarrhea 08/02/2017  . Lichen sclerosus et atrophicus of the vulva 03/31/2016  . Abdominal wall mass of right lower quadrant 03/08/2016  .  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 Goals of care, counseling/discussion 02/27/2016  . Elevated CA-125 02/24/2016  . Vaginal dryness, menopausal 10/01/2015  . Drug-induced peripheral neuropathy ([place]) 09/02/2015  . Leukopenia due to antineoplastic chemotherapy ([place]) 09/02/2015  . Chemotherapy-induced neuropathy ([place]) 08/19/2015  . Cellulitis 07/03/2015  . Acute sensory neuropathy 06/26/2015  . High risk medication use 06/21/2015  . Facial paresthesia 06/18/2015  . Genetic testing 06/16/2015  . Restless legs 05/14/2015  . Family history of breast cancer in female 04/29/2015  . Chemotherapy-induced nausea 04/22/2015  . Chemotherapy induced neutropenia ([place]) 04/22/2015  . Cancer of right fallopian tube ([place]) 04/07/2015  . History of hysterectomy for benign disease 03/21/2015  . IBS (irritable bowel syndrome) 03/21/2015  . Dense breast tissue 03/21/2015  . History of cholecystectomy 03/21/2015  . Abdominal carcinomatosis ([place]) 03/21/2015    Allyson Sabal [facility] 09/19/2018, 12:22  PM  [place], [place], [address] Phone: [phone]   Fax:  [phone]  Name: Stashia Sia MRN: [id number] Date of Birth: 1965/05/13  Manus Gunning, PT 09/19/18 12:23 PM

## 2018-09-20 ENCOUNTER — Ambulatory Visit: Payer: 59

## 2018-09-20 ENCOUNTER — Other Ambulatory Visit: Payer: Self-pay

## 2018-09-20 DIAGNOSIS — M62838 Other muscle spasm: Secondary | ICD-10-CM

## 2018-09-20 DIAGNOSIS — M6281 Muscle weakness (generalized): Secondary | ICD-10-CM

## 2018-09-20 DIAGNOSIS — G620 Drug-induced polyneuropathy: Secondary | ICD-10-CM

## 2018-09-20 DIAGNOSIS — R262 Difficulty in walking, not elsewhere classified: Secondary | ICD-10-CM

## 2018-09-20 DIAGNOSIS — T451X5A Adverse effect of antineoplastic and immunosuppressive drugs, initial encounter: Secondary | ICD-10-CM

## 2018-09-20 NOTE — Therapy (Signed)
[place], [place], [address] Phone: [phone]   Fax:  [phone]  Physical Therapy Treatment  Patient Details  Name: Kathryn Ramsey MRN: [id number] Date of Birth: [birth year] Referring Provider (PT): Causey   Encounter Date: 09/20/2018  PT End of Session - 09/20/18 1206    Visit Number  2    Number of Visits  11    Date for PT Re-Evaluation  10/31/18    PT Start Time  1105    PT Stop Time  1200    PT Time Calculation (min)  55 min    Activity Tolerance  Patient tolerated treatment well    Behavior During Therapy  [facility] for tasks assessed/performed       Past Medical History:  Diagnosis Date  . Acute sensory neuropathy 06/26/2015  . Allergy   . Anemia   . Asthma    illness induced asthma  . Blood transfusion without reported diagnosis    at age 53 years old D/T surgery to femur being crushed  . 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 Heart murmur    pt, states had a "working heart murmur"  . History of kidney stones   . Neutropenia, drug-induced ([place]) 03/10/2018  . PONV (postoperative nausea and vomiting)   . Skin cancer     Past Surgical History:  Procedure Laterality Date  . 3 laporscopic proceedures    . ABDOMINAL HYSTERECTOMY     2010  . CHOLECYSTECTOMY     2010  . DILATION AND CURETTAGE OF UTERUS     1997  . IR IMAGING GUIDED PORT INSERTION  03/08/2018  . LAPAROSCOPY N/A 03/27/2015   Procedure: DIAGNOSTIC LAPAROSCOPY ;  Surgeon: [name], MD;  Location: [facility];  Service: Gynecology;  Laterality: N/A;  . LAPAROSCOPY N/A 02/24/2016   Procedure: LAPAROSCOPY DIAGNOSTIC WITH PERITONEAL WASHINGS AND PERITONEAL BIOPSY;  Surgeon: [name], MD;  Location: [facility];  Service: Gynecology;  Laterality: N/A;  . LAPAROTOMY N/A 03/27/2015   Procedure: EXPLORATORY LAPAROTOMY, BILATERAL SALPINGO OOPHORECTOMY, OMENTECTOMY, RADICAL  TUMOR DEBULKING;  Surgeon: [name], MD;  Location: [facility];  Service: Gynecology;  Laterality: N/A;  . sinus surgery 1994      There were no vitals filed for this visit.  Subjective Assessment - 09/20/18 1108    Subjective  Nothing new since yesterday.    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  Yes    Pain Score  4     Pain Location  Foot    Pain Orientation  Right;Left    Pain Descriptors / Indicators  Tingling;Numbness    Pain Type  Neuropathic pain    Pain Onset  More than a month ago    Aggravating Factors   nothing, just end of day    Pain Relieving Factors  gabapentin helps some, especially since I've started taking it a little earlier like at dinner time, instead of bed                       [facility] Adult PT Treatment/Exercise - 09/20/18 0001      Neuro Re-ed  Neuro Re-ed Details   In // bars: Slow and controlled high knee marching 2x, Rt grapevine 1x, very easy so with fingertips along bars for same for eyes closed 1x each direction; backwards heel-toe with eyes open and closed 1x each; front step ups on 6" step with Airex 10x each side with 3 sec hold with each step up      Knee/Hip Exercises: Stretches   Active Hamstring Stretch  Right;Left;3 reps;20 seconds   seated edge of chair   Gastroc Stretch  Both;3 reps;20 seconds   in // bars on profitter     Knee/Hip Exercises: Aerobic   Elliptical  On Quickstart setting (ortho side) x5 mins with pt monitoring pt throughout      Knee/Hip Exercises: Machines for Strengthening   Cybex Knee Extension  15# x12    Cybex Knee Flexion  35# x12    Cybex Leg Press  65# 2x20      Modalities   Modalities  Buyer, retail Stimulation Location  Plantar surface    Electrical Stimulation Action  IFC (1 channel per foot)    Electrical Stimulation Parameters  80-150 Hz, x10 mins. Pt with good sensation (output 13 Rt and 16 Lt)    Electrical Stimulation Goals  Neuromuscular facilitation             PT Education - 09/20/18 1207    Education Details  Verblaly instructed pt in ideas for her hand CIPN like squeezing putty or playdoh in a bag, tapping each finger to thumb and try to progress timing of this and if she gets TENS can use that as well    Person(s) Educated  Patient    Methods  Explanation;Demonstration    Comprehension  Verbalized understanding          PT Long Term Goals - 09/19/18 1220      PT LONG TERM GOAL #1   Title  Pt will report a 50% improvement in pain and tingling in bilateral feet to allow improved comfort    Time  6    Period  Weeks    Status  New    Target Date  10/31/18      PT LONG TERM GOAL #2   Title  Pt will demonstrate 5/5 left hamstring strength to allow pt to return to PLOF    Baseline  4/5    Time  6    Period  Weeks    Status  New    Target Date  10/31/18      PT LONG TERM GOAL #3   Title  Pt will demonstrate 5/5 R dorisflexor strength to decrase fall risk    Baseline  4/5    Time  6    Period  Weeks    Status  New    Target Date  10/31/18      PT LONG TERM GOAL #4   Title  Pt will be independent in a home exercise program for continued strengthening and stretching    Time  6    Period  Weeks    Status  New    Target Date  10/31/18            Plan - 09/20/18 1206    Clinical Impression Statement  Pt tolerated first session of balance, bil LE strength and e stim very well. She reports some good progress noted in her sensation in her feet, especially after  IFC, by end of session.    Personal Factors and Comorbidities  Time since onset of injury/illness/exacerbation;Comorbidity 1    Comorbidities  cancer with multiple recurrences     Examination-Participation Restrictions  Community Activity    Stability/Clinical Decision Making  Evolving/Moderate complexity    Rehab Potential  Good    PT Frequency  2x / week    PT Duration  6 weeks    PT Treatment/Interventions  ADLs/Self Care Home Management;Electrical Stimulation;Moist Heat;Cryotherapy;Therapeutic exercise;Balance training;Therapeutic activities;Patient/family education;Manual techniques    PT Next Visit Plan  Cont weight bearing exercises for L hamstring, R d/f, elliptical, high level balance activities, estim to feet as she reports this felt very helpful    Consulted and Agree with Plan of Care  Patient       Patient will benefit from skilled therapeutic intervention in order to improve the following deficits and impairments:  Impaired sensation, Decreased strength, Pain  Visit Diagnosis: 1. Muscle weakness (generalized)   2. Peripheral neuropathy due to chemotherapy ([place])   3. Difficulty in walking, not elsewhere classified   4. Other muscle spasm        Problem List Patient Active Problem List   Diagnosis Date Noted  . Neuropathy due to chemotherapeutic drug ([place]) 07/24/2018  . Anemia due to antineoplastic chemotherapy 05/22/2018  . Port-A-Cath in place 05/01/2018  . Neutropenia, drug-induced ([place]) 03/10/2018  . Diarrhea 08/02/2017  . Lichen sclerosus et atrophicus of the vulva 03/31/2016  . Abdominal wall mass of right lower quadrant 03/08/2016  . 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 Goals of care, counseling/discussion 02/27/2016  . Elevated CA-125 02/24/2016  . Vaginal dryness, menopausal 10/01/2015  . Drug-induced peripheral neuropathy ([place]) 09/02/2015  . Leukopenia due to antineoplastic chemotherapy ([place]) 09/02/2015  . Chemotherapy-induced neuropathy ([place]) 08/19/2015  . Cellulitis 07/03/2015  . Acute sensory neuropathy 06/26/2015  . High risk medication use 06/21/2015  . Facial paresthesia 06/18/2015  . Genetic testing 06/16/2015   . Restless legs 05/14/2015  . Family history of breast cancer in female 04/29/2015  . Chemotherapy-induced nausea 04/22/2015  . Chemotherapy induced neutropenia ([place]) 04/22/2015  . Cancer of right fallopian tube ([place]) 04/07/2015  . History of hysterectomy for benign disease 03/21/2015  . IBS (irritable bowel syndrome) 03/21/2015  . Dense breast tissue 03/21/2015  . History of cholecystectomy 03/21/2015  . Abdominal carcinomatosis ([place]) 03/21/2015    Otelia Limes, PTA 09/20/2018, 12:10 PM  [place], [place], [address] Phone: [phone]   Fax:  [phone]  Name: Kathryn Ramsey MRN: [id number] Date of Birth: [birth year]

## 2018-09-21 ENCOUNTER — Ambulatory Visit (HOSPITAL_COMMUNITY): Admission: RE | Admit: 2018-09-21 | Payer: 59 | Source: Ambulatory Visit

## 2018-09-21 ENCOUNTER — Telehealth: Payer: Self-pay | Admitting: *Deleted

## 2018-09-21 ENCOUNTER — Other Ambulatory Visit: Payer: Self-pay | Admitting: Oncology

## 2018-09-21 NOTE — Telephone Encounter (Signed)
This RN received a call from pt stating she went for PET scan this morning " but could not get done because it hasn't been preauthorized "  " I was told we should know by this afternoon - but I haven't heard from anyone "  This RN reviewed chart and noted PET scan ordered 08/21/2018 with expected date for today was put in for prior auth on 09/19/2018 with note stated " going for clinical review ".  This RN informed Kathryn Ramsey her inquiry will be forwarded to appropriate personnel for follow up and contact so she can be rescheduled.

## 2018-09-25 ENCOUNTER — Inpatient Hospital Stay: Payer: 59

## 2018-09-25 ENCOUNTER — Inpatient Hospital Stay: Payer: 59 | Attending: Oncology

## 2018-09-25 ENCOUNTER — Inpatient Hospital Stay (HOSPITAL_BASED_OUTPATIENT_CLINIC_OR_DEPARTMENT_OTHER): Payer: 59 | Admitting: Oncology

## 2018-09-25 ENCOUNTER — Other Ambulatory Visit: Payer: Self-pay

## 2018-09-25 VITALS — BP 113/66 | HR 64 | Temp 98.3°F | Ht 63.0 in | Wt 128.7 lb

## 2018-09-25 DIAGNOSIS — T451X5A Adverse effect of antineoplastic and immunosuppressive drugs, initial encounter: Secondary | ICD-10-CM | POA: Insufficient documentation

## 2018-09-25 DIAGNOSIS — D702 Other drug-induced agranulocytosis: Secondary | ICD-10-CM

## 2018-09-25 DIAGNOSIS — Z8249 Family history of ischemic heart disease and other diseases of the circulatory system: Secondary | ICD-10-CM | POA: Insufficient documentation

## 2018-09-25 DIAGNOSIS — C786 Secondary malignant neoplasm of retroperitoneum and peritoneum: Secondary | ICD-10-CM | POA: Diagnosis not present

## 2018-09-25 DIAGNOSIS — Z808 Family history of malignant neoplasm of other organs or systems: Secondary | ICD-10-CM | POA: Diagnosis not present

## 2018-09-25 DIAGNOSIS — Z823 Family history of stroke: Secondary | ICD-10-CM | POA: Insufficient documentation

## 2018-09-25 DIAGNOSIS — Z888 Allergy status to other drugs, medicaments and biological substances status: Secondary | ICD-10-CM | POA: Insufficient documentation

## 2018-09-25 DIAGNOSIS — Z85828 Personal history of other malignant neoplasm of skin: Secondary | ICD-10-CM | POA: Insufficient documentation

## 2018-09-25 DIAGNOSIS — Z95828 Presence of other vascular implants and grafts: Secondary | ICD-10-CM | POA: Diagnosis not present

## 2018-09-25 DIAGNOSIS — C5701 Malignant neoplasm of right fallopian tube: Secondary | ICD-10-CM

## 2018-09-25 DIAGNOSIS — Z8051 Family history of malignant neoplasm of kidney: Secondary | ICD-10-CM | POA: Diagnosis not present

## 2018-09-25 DIAGNOSIS — Z79899 Other long term (current) drug therapy: Secondary | ICD-10-CM | POA: Diagnosis not present

## 2018-09-25 DIAGNOSIS — C762 Malignant neoplasm of abdomen: Secondary | ICD-10-CM

## 2018-09-25 DIAGNOSIS — D701 Agranulocytosis secondary to cancer chemotherapy: Secondary | ICD-10-CM

## 2018-09-25 DIAGNOSIS — G620 Drug-induced polyneuropathy: Secondary | ICD-10-CM | POA: Insufficient documentation

## 2018-09-25 DIAGNOSIS — Z882 Allergy status to sulfonamides status: Secondary | ICD-10-CM | POA: Insufficient documentation

## 2018-09-25 DIAGNOSIS — D6481 Anemia due to antineoplastic chemotherapy: Secondary | ICD-10-CM | POA: Diagnosis not present

## 2018-09-25 DIAGNOSIS — Z170 Estrogen receptor positive status [ER+]: Secondary | ICD-10-CM | POA: Insufficient documentation

## 2018-09-25 DIAGNOSIS — Z87442 Personal history of urinary calculi: Secondary | ICD-10-CM | POA: Diagnosis not present

## 2018-09-25 DIAGNOSIS — C569 Malignant neoplasm of unspecified ovary: Secondary | ICD-10-CM | POA: Insufficient documentation

## 2018-09-25 DIAGNOSIS — R188 Other ascites: Secondary | ICD-10-CM | POA: Diagnosis not present

## 2018-09-25 DIAGNOSIS — Z803 Family history of malignant neoplasm of breast: Secondary | ICD-10-CM | POA: Insufficient documentation

## 2018-09-25 DIAGNOSIS — C801 Malignant (primary) neoplasm, unspecified: Secondary | ICD-10-CM

## 2018-09-25 LAB — COMPREHENSIVE METABOLIC PANEL
ALT: 37 U/L (ref 0–44)
AST: 28 U/L (ref 15–41)
Albumin: 4.5 g/dL (ref 3.5–5.0)
Alkaline Phosphatase: 103 U/L (ref 38–126)
Anion gap: 12 (ref 5–15)
BUN: 20 mg/dL (ref 6–20)
CO2: 21 mmol/L — ABNORMAL LOW (ref 22–32)
Calcium: 9.5 mg/dL (ref 8.9–10.3)
Chloride: 105 mmol/L (ref 98–111)
Creatinine, Ser: 1.08 mg/dL — ABNORMAL HIGH (ref 0.44–1.00)
GFR calc Af Amer: >60 mL/min (ref >60)
GFR calc non Af Amer: 58 mL/min — ABNORMAL LOW (ref >60)
Glucose, Bld: 92 mg/dL (ref 70–99)
Potassium: 4.3 mmol/L (ref 3.5–5.1)
Sodium: 138 mmol/L (ref 135–145)
Total Bilirubin: 0.4 mg/dL (ref 0.3–1.2)
Total Protein: 7.5 g/dL (ref 6.5–8.1)

## 2018-09-25 LAB — CBC WITH DIFFERENTIAL/PLATELET
Abs Immature Granulocytes: 0.06 10*3/uL (ref 0.00–0.07)
Basophils Absolute: 0 10*3/uL (ref 0.0–0.1)
Basophils Relative: 0 %
Eosinophils Absolute: 0 10*3/uL (ref 0.0–0.5)
Eosinophils Relative: 0 %
HCT: 28 % — ABNORMAL LOW (ref 36.0–46.0)
Hemoglobin: 9.2 g/dL — ABNORMAL LOW (ref 12.0–15.0)
Immature Granulocytes: 1 %
Lymphocytes Relative: 29 %
Lymphs Abs: 1.8 10*3/uL (ref 0.7–4.0)
MCH: 34.2 pg — ABNORMAL HIGH (ref 26.0–34.0)
MCHC: 32.9 g/dL (ref 30.0–36.0)
MCV: 104.1 fL — ABNORMAL HIGH (ref 80.0–100.0)
Monocytes Absolute: 0.6 10*3/uL (ref 0.1–1.0)
Monocytes Relative: 10 %
Neutro Abs: 3.7 10*3/uL (ref 1.7–7.7)
Neutrophils Relative %: 60 %
Platelets: 119 10*3/uL — ABNORMAL LOW (ref 150–400)
RBC: 2.69 MIL/uL — ABNORMAL LOW (ref 3.87–5.11)
RDW: 16.7 % — ABNORMAL HIGH (ref 11.5–15.5)
WBC: 6.1 10*3/uL (ref 4.0–10.5)
nRBC: 0 % (ref 0.0–0.2)

## 2018-09-25 LAB — FERRITIN: Ferritin: 379 ng/mL — ABNORMAL HIGH (ref 11–307)

## 2018-09-25 MED ORDER — SODIUM CHLORIDE 0.9% FLUSH
10.0000 mL | INTRAVENOUS | Status: DC | PRN
  Administered 2018-09-25: 10 mL
  Filled 2018-09-25: qty 10

## 2018-09-25 MED ORDER — HEPARIN SOD (PORK) LOCK FLUSH 100 UNIT/ML IV SOLN
500.0000 [IU] | Freq: Once | INTRAVENOUS | Status: AC | PRN
  Administered 2018-09-25: 500 [IU]
  Filled 2018-09-25: qty 5
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
## 2018-09-25 NOTE — Patient Instructions (Signed)

## 2018-09-26 ENCOUNTER — Other Ambulatory Visit: Payer: Self-pay

## 2018-09-26 ENCOUNTER — Other Ambulatory Visit: Payer: Self-pay | Admitting: Oncology

## 2018-09-26 ENCOUNTER — Telehealth: Payer: Self-pay | Admitting: Oncology

## 2018-09-26 ENCOUNTER — Telehealth: Payer: Self-pay

## 2018-09-26 DIAGNOSIS — C786 Secondary malignant neoplasm of retroperitoneum and peritoneum: Secondary | ICD-10-CM

## 2018-09-26 DIAGNOSIS — C762 Malignant neoplasm of abdomen: Secondary | ICD-10-CM

## 2018-09-26 DIAGNOSIS — C5701 Malignant neoplasm of right fallopian tube: Secondary | ICD-10-CM

## 2018-09-26 LAB — CA 125: Cancer Antigen (CA) 125: 373 U/mL — ABNORMAL HIGH (ref 0.0–38.1)

## 2018-09-26 NOTE — Telephone Encounter (Signed)
FYI   PET Denied   Guidelines Section: ONC 21.4 Ovarian Cancer - Restaging/Recurrence, we cannot approve this  request. Your records show that you are being treated for cancer in your ovaries. This is a pair  of tiny glands in the female body. They make female hormones and produce and release eggs  in the female body. The reason this request cannot be approved is because:  Guidelines might support PET imaging in the restaging evaluation of ovarian cancer in one of  the following conditions. -CT scans are negative and CA-125 continues to rise or elevated LFTs.  -Conventional imaging failed to demonstrate tumor or if persistent radiographic mass with rising  tumor markers. The clinical information provided does not meet these criteria and, therefore, the  request is not indicated at this time.   A CT of the abdomen and pelvis with contrast ([id number]) is supported for the clinical  indication(s) presented. The clinical information provided describes a need for a CT of the  abdomen and pelvis with contrast which can be approved as an alternative. Please contact us if  you would like to accept this recommendation.   [place] at: [phone].  Case Number:[id number]   Please advise   Thank You,  Pashon D

## 2018-09-26 NOTE — Telephone Encounter (Signed)
PA for PET scan denied. CT abdomen and pelvis order placed. In basket msg sent to [employer] to start PA process. Spoke with pt by phone to make her aware.

## 2018-09-26 NOTE — Telephone Encounter (Signed)
I talk with patient regarding schedule  

## 2018-09-27 ENCOUNTER — Other Ambulatory Visit: Payer: Self-pay

## 2018-09-27 ENCOUNTER — Other Ambulatory Visit: Payer: Self-pay | Admitting: Oncology

## 2018-09-27 ENCOUNTER — Ambulatory Visit: Payer: 59 | Admitting: Physical Therapy

## 2018-09-27 ENCOUNTER — Telehealth: Payer: Self-pay | Admitting: Neurology

## 2018-09-27 ENCOUNTER — Encounter: Payer: Self-pay | Admitting: Physical Therapy

## 2018-09-27 DIAGNOSIS — M6281 Muscle weakness (generalized): Secondary | ICD-10-CM | POA: Diagnosis not present

## 2018-09-27 DIAGNOSIS — R262 Difficulty in walking, not elsewhere classified: Secondary | ICD-10-CM

## 2018-09-27 DIAGNOSIS — G620 Drug-induced polyneuropathy: Secondary | ICD-10-CM

## 2018-09-27 DIAGNOSIS — M62838 Other muscle spasm: Secondary | ICD-10-CM

## 2018-09-27 NOTE — Therapy (Signed)
[name], [place], [address] Phone: [phone]   Fax:  [phone]  Physical Therapy Treatment  Patient Details  Name: Kathryn Ramsey MRN: [id number] Date of Birth: Aug 22, 1965 Referring Provider (PT): Causey   Encounter Date: 09/27/2018  PT End of Session - 09/27/18 1605    Visit Number  3    Number of Visits  11    Date for PT Re-Evaluation  10/31/18    PT Start Time  1230    PT Stop Time  1320    PT Time Calculation (min)  50 min    Activity Tolerance  Patient tolerated treatment well    Behavior During Therapy  [facility] for tasks assessed/performed       Past Medical History:  Diagnosis Date  . Acute sensory neuropathy 06/26/2015  . Allergy   . Anemia   . Asthma    illness induced asthma  . Blood transfusion without reported diagnosis    at age 53 years old D/T surgery to femur being crushed  . 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 Heart murmur    pt, states had a "working heart murmur"  . History of kidney stones   . Neutropenia, drug-induced ([place]) 03/10/2018  . PONV (postoperative nausea and vomiting)   . Skin cancer     Past Surgical History:  Procedure Laterality Date  . 3 laporscopic proceedures    . ABDOMINAL HYSTERECTOMY     2010  . CHOLECYSTECTOMY     2010  . DILATION AND CURETTAGE OF UTERUS     1997  . IR IMAGING GUIDED PORT INSERTION  03/08/2018  . LAPAROSCOPY N/A 03/27/2015   Procedure: DIAGNOSTIC LAPAROSCOPY ;  Surgeon: [name], MD;  Location: [facility];  Service: Gynecology;  Laterality: N/A;  . LAPAROSCOPY N/A 02/24/2016   Procedure: LAPAROSCOPY DIAGNOSTIC WITH PERITONEAL WASHINGS AND PERITONEAL BIOPSY;  Surgeon: [name], MD;  Location: [facility];  Service: Gynecology;  Laterality: N/A;  . LAPAROTOMY N/A 03/27/2015   Procedure: EXPLORATORY LAPAROTOMY, BILATERAL SALPINGO OOPHORECTOMY, OMENTECTOMY, RADICAL  TUMOR DEBULKING;  Surgeon: [name], MD;  Location: [facility];  Service: Gynecology;  Laterality: N/A;  . sinus surgery 1994      There were no vitals filed for this visit.  Subjective Assessment - 09/27/18 1541    Subjective  Pt reports she thinks she got relief after the estim last session. 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  Yes    Pain Score  --   did not rate   Pain Location  Foot    Pain Orientation  Right;Left    Pain Descriptors / Indicators  Tightness;Numbness;Shooting    Pain Type  Neuropathic pain    Pain Radiating Towards  feet and toes    Aggravating Factors   Pt states shooting pain down to toes is aggravated by flexing her neck chin to chest    Pain Relieving Factors  gabapentin helps some                       [facility] Adult PT Treatment/Exercise - 09/27/18 0001  Exercises   Exercises  Knee/Hip;Ankle      Knee/Hip Exercises: Sidelying   Hip ABduction  Strengthening;Right;Left;10 reps    Clams  10 reps on each side with cues to keep core engaged     Other Sidelying Knee/Hip Exercises  small circles with leg in aobut 45 degrees of abduction x 10 reps with cues to keep core stable       Modalities   Modalities  Electrical Stimulation;Moist Heat      Moist Heat Therapy   Number Minutes Moist Heat  5 Minutes    Moist Heat Location  --   both feet      Electrical Stimulation   Electrical Stimulation Location  Plantar surface    Electrical Stimulation Action  !FC ( 1 channel per foot)     Electrical Stimulation Parameters  80-150 Hz x 10 minutes     Electrical Stimulation Goals  Neuromuscular facilitation      Ankle Exercises: Standing   Heel Raises  Right;Left;Both    Heel  Raises Limitations  1-2 reps unilaterally and bilateral       Ankle Exercises: Seated   Other Seated Ankle Exercises  toe scruches and push aways of towel for resisted toe flexion and extension and inversion and eversion              PT Education - 09/27/18 1604    Education Details  gave pt information about how to get a TENS unit online, link for [facility] for online exercise classes.  Also suggested that pt consider acupuncture if she does not get adequate relief from our treatment here.    Person(s) Educated  Patient    Methods  Explanation    Comprehension  Verbalized understanding          PT Long Term Goals - 09/19/18 1220      PT LONG TERM GOAL #1   Title  Pt will report a 50% improvement in pain and tingling in bilateral feet to allow improved comfort    Time  6    Period  Weeks    Status  New    Target Date  10/31/18      PT LONG TERM GOAL #2   Title  Pt will demonstrate 5/5 left hamstring strength to allow pt to return to PLOF    Baseline  4/5    Time  6    Period  Weeks    Status  New    Target Date  10/31/18      PT LONG TERM GOAL #3   Title  Pt will demonstrate 5/5 R dorisflexor strength to decrase fall risk    Baseline  4/5    Time  6    Period  Weeks    Status  New    Target Date  10/31/18      PT LONG TERM GOAL #4   Title  Pt will be independent in a home exercise program for continued strengthening and stretching    Time  6    Period  Weeks    Status  New    Target Date  10/31/18            Plan - 09/27/18 1605    Clinical Impression Statement  Pt did very well with ankle stabilty exercises today.  She had more diffiuculty with hip strengthening and will benefit from more work11 in this area.  Pt received releift from estim and she will consider getting a TENS  unit.    Comorbidities  cancer with multiple recurrences    Examination-Participation Restrictions  Community Activity    Stability/Clinical Decision Making  Evolving/Moderate  complexity    PT Treatment/Interventions  ADLs/Self Care Home Management;Electrical Stimulation;Moist Heat;Cryotherapy;Therapeutic exercise;Balance training;Therapeutic activities;Patient/family education;Manual techniques    PT Next Visit Plan  Cont exercise for hips and lower core, weight bearing exercises for L hamstring, R d/f, elliptical, high level balance activities, estim to feet as she reports this felt very helpful. moist heat and massage to feet as indicated.    PT Home Exercise Plan  Sent information to Wilber Bihari about getting a neuropathy cream ( see Marcene Brawn for more details)    Consulted and Agree with Plan of Care  Patient       Patient will benefit from skilled therapeutic intervention in order to improve the following deficits and impairments:  Impaired sensation, Decreased strength, Pain  Visit Diagnosis: 1. Muscle weakness (generalized)   2. Peripheral neuropathy due to chemotherapy ([place])   3. Difficulty in walking, not elsewhere classified   4. Other muscle spasm        Problem List Patient Active Problem List   Diagnosis Date Noted  . Neuropathy due to chemotherapeutic drug ([place]) 07/24/2018  . Anemia due to antineoplastic chemotherapy 05/22/2018  . Port-A-Cath in place 05/01/2018  . Neutropenia, drug-induced ([place]) 03/10/2018  . Diarrhea 08/02/2017  . Lichen sclerosus et atrophicus of the vulva 03/31/2016  . Abdominal wall mass of right lower quadrant 03/08/2016  . 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 Goals of care, counseling/discussion 02/27/2016  . Elevated CA-125 02/24/2016  . Vaginal dryness, menopausal 10/01/2015  . Drug-induced peripheral neuropathy ([place]) 09/02/2015  . Leukopenia due to antineoplastic chemotherapy ([place]) 09/02/2015  . Chemotherapy-induced neuropathy ([place]) 08/19/2015  . Cellulitis 07/03/2015  . Acute sensory neuropathy 06/26/2015  . High risk medication use 06/21/2015  . Facial paresthesia 06/18/2015  . Genetic testing  06/16/2015  . Restless legs 05/14/2015  . Family history of breast cancer in female 04/29/2015  . Chemotherapy-induced nausea 04/22/2015  . Chemotherapy induced neutropenia ([place]) 04/22/2015  . Cancer of right fallopian tube ([place]) 04/07/2015  . History of hysterectomy for benign disease 03/21/2015  . IBS (irritable bowel syndrome) 03/21/2015  . Dense breast tissue 03/21/2015  . History of cholecystectomy 03/21/2015  . Abdominal carcinomatosis ([place]) 03/21/2015   Donato Heinz. Owens Shark PT  Norwood Levo 09/27/2018, 4:13 PM  [place], [place], [address] Phone: [phone]   Fax:  [phone]  Name: Havanna Groner MRN: [id number] Date of Birth: [birth year]

## 2018-09-27 NOTE — Telephone Encounter (Signed)
I talked to the patient.  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  She has a mild shock sensation in both feet when she flexes her head down.  This has gradually worsened over time.  This may have developed while on cisplatin therapy.  Upon a search of the literature, cisplatin has been associated with this phenomena, and tends to get better when the chemotherapy agent is withdrawn.  I would probably go ahead and check a vitamin B12 level and copper level on her next blood draw to ensure that this is not a nutritional issue.  It is likely however that the cisplatin is the culprit.

## 2018-09-28 ENCOUNTER — Other Ambulatory Visit: Payer: Self-pay | Admitting: Oncology

## 2018-09-28 ENCOUNTER — Ambulatory Visit (HOSPITAL_COMMUNITY)
Admission: RE | Admit: 2018-09-28 | Discharge: 2018-09-28 | Disposition: A | Discharge status: Home / Self Care | Payer: 59 | Source: Ambulatory Visit | Attending: Oncology | Admitting: Oncology

## 2018-09-28 ENCOUNTER — Encounter: Payer: Self-pay | Admitting: Oncology

## 2018-09-28 DIAGNOSIS — C762 Malignant neoplasm of abdomen: Secondary | ICD-10-CM | POA: Insufficient documentation

## 2018-09-28 DIAGNOSIS — C5701 Malignant neoplasm of right fallopian tube: Secondary | ICD-10-CM | POA: Diagnosis not present

## 2018-09-28 DIAGNOSIS — C801 Malignant (primary) neoplasm, unspecified: Secondary | ICD-10-CM | POA: Insufficient documentation

## 2018-09-28 DIAGNOSIS — C786 Secondary malignant neoplasm of retroperitoneum and peritoneum: Secondary | ICD-10-CM

## 2018-09-28 MED ORDER — IOHEXOL 300 MG/ML  SOLN
100.0000 mL | Freq: Once | INTRAMUSCULAR | Status: AC | PRN
  Administered 2018-09-28: 80 mL via INTRAVENOUS

## 2018-09-28 MED ORDER — SODIUM CHLORIDE (PF) 0.9 % IJ SOLN
INTRAMUSCULAR | Status: AC
  Filled 2018-09-28: qty 50

## 2018-09-28 NOTE — Progress Notes (Signed)
I called Kathryn Ramsey and gave her the results of her CT.  Unfortunately it is very difficult to tell whether there has been progression or not.  We are going to need a PET scan to further evaluate this

## 2018-09-29 ENCOUNTER — Encounter

## 2018-09-29 ENCOUNTER — Ambulatory Visit (HOSPITAL_COMMUNITY): Payer: 59

## 2018-10-02 ENCOUNTER — Other Ambulatory Visit: Payer: Self-pay

## 2018-10-02 ENCOUNTER — Telehealth: Payer: Self-pay | Admitting: *Deleted

## 2018-10-02 ENCOUNTER — Encounter: Payer: Self-pay | Admitting: Rehabilitation

## 2018-10-02 ENCOUNTER — Ambulatory Visit: Payer: 59 | Admitting: Rehabilitation

## 2018-10-02 DIAGNOSIS — M6281 Muscle weakness (generalized): Secondary | ICD-10-CM | POA: Diagnosis not present

## 2018-10-02 DIAGNOSIS — R262 Difficulty in walking, not elsewhere classified: Secondary | ICD-10-CM

## 2018-10-02 DIAGNOSIS — G620 Drug-induced polyneuropathy: Secondary | ICD-10-CM

## 2018-10-02 DIAGNOSIS — M62838 Other muscle spasm: Secondary | ICD-10-CM

## 2018-10-02 NOTE — Telephone Encounter (Signed)
This RN spoke with pt per her call stating she has spoken with the [facility] doctor who is reviewing 2 trials that may be of benefit - and suggested best to hold treatment scheduled for 8/18 at this time.  Kathryn Ramsey would like to cancel appointments for tomorrow " please let Kathryn Ramsey know " and then have a virtual visit next week with Kathryn Ramsey to " discuss all my options- and if he could put his head together with Kathryn Ramsey or anyone else regarding treatment regimens that would be great "  [name] states she is hoping to hear from [facility] regarding trials later this week as well.  Appointments cancelled by this RN as well as this note will be sent to MD and NP for review of communication as well as date and time for virtual visit.

## 2018-10-02 NOTE — Therapy (Signed)
[place], [place], [address] Phone: [phone]   Fax:  [phone]  Physical Therapy Treatment  Patient Details  Name: Kathryn Ramsey MRN: [id number] Date of Birth: Aug 05, 1965 Referring Provider (PT): Causey   Encounter Date: 10/02/2018  PT End of Session - 10/02/18 1220    Visit Number  4    Number of Visits  11    Date for PT Re-Evaluation  10/31/18    PT Start Time  1130    PT Stop Time  1230    PT Time Calculation (min)  60 min    Activity Tolerance  Patient tolerated treatment well    Behavior During Therapy  [facility] for tasks assessed/performed       Past Medical History:  Diagnosis Date  . Acute sensory neuropathy 06/26/2015  . Allergy   . Anemia   . Asthma    illness induced asthma  . Blood transfusion without reported diagnosis    at age 7 years old D/T surgery to femur being crushed  . 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 Heart murmur    pt, states had a "working heart murmur"  . History of kidney stones   . Neutropenia, drug-induced ([place]) 03/10/2018  . PONV (postoperative nausea and vomiting)   . Skin cancer     Past Surgical History:  Procedure Laterality Date  . 3 laporscopic proceedures    . ABDOMINAL HYSTERECTOMY     2010  . CHOLECYSTECTOMY     2010  . DILATION AND CURETTAGE OF UTERUS     1997  . IR IMAGING GUIDED PORT INSERTION  03/08/2018  . LAPAROSCOPY N/A 03/27/2015   Procedure: DIAGNOSTIC LAPAROSCOPY ;  Surgeon: [name], MD;  [facility];  Service: Gynecology;  Laterality: N/A;  . LAPAROSCOPY N/A 02/24/2016   Procedure: LAPAROSCOPY DIAGNOSTIC WITH PERITONEAL WASHINGS AND PERITONEAL BIOPSY;  Surgeon: [name], MD;  [facility];  Service: Gynecology;  Laterality: N/A;  . LAPAROTOMY N/A 03/27/2015   Procedure: EXPLORATORY LAPAROTOMY, BILATERAL SALPINGO OOPHORECTOMY, OMENTECTOMY, RADICAL  TUMOR DEBULKING;  Surgeon: [name], MD;  [facility];  Service: Gynecology;  Laterality: N/A;  . sinus surgery 1994      There were no vitals filed for this visit.  Subjective Assessment - 10/02/18 1134    Subjective  They are definitely getting better,  Lt left is the better one.  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  Yes    Pain Score  2     Pain Location  Foot    Pain Orientation  Right;Left         [facility] PT Assessment - 10/02/18 0001      Standardized Balance Assessment   Standardized Balance Assessment  --   fullerton advanced balance scale; 30/40     High Level Balance   High Level Balance Comments  difficulty with head turns with gait,  and knees buckling with small hop in the hallway                   [facility] Adult PT Treatment/Exercise - 10/02/18 0001  High Level Balance   High Level Balance Comments  in parallel bars: SL stance, feet together with EC work on the blue foam, head turns R/L with feet together on blue foam, heel raises and toe raises x 10 each, heel raises x 10 on blue foam, red theraband single leg on blue foam resisted arm extension x 10 bil      Knee/Hip Exercises: Aerobic   Elliptical  quickstart x 59min level 1 having to stop at 86min today due to fatigue       Knee/Hip Exercises: Machines for Strengthening   Total Gym Leg Press  40# 2x10 and calf raise x 15      Moist Heat Therapy   Number Minutes Moist Heat  10 Minutes    Moist Heat Location  --   feet     Electrical Stimulation   Electrical Stimulation Location  Plantar surface    Electrical Stimulation Action  IFC    Electrical Stimulation Parameters  to tolerance    Electrical Stimulation Goals  Pain                   PT Long Term Goals - 09/19/18 1220      PT LONG TERM GOAL #1   Title  Pt will report a 50% improvement in pain and tingling in bilateral feet to allow improved comfort    Time  6    Period  Weeks    Status  New    Target Date  10/31/18      PT LONG TERM GOAL #2   Title  Pt will demonstrate 5/5 left hamstring strength to allow pt to return to PLOF    Baseline  4/5    Time  6    Period  Weeks    Status  New    Target Date  10/31/18      PT LONG TERM GOAL #3   Title  Pt will demonstrate 5/5 R dorisflexor strength to decrase fall risk    Baseline  4/5    Time  6    Period  Weeks    Status  New    Target Date  10/31/18      PT LONG TERM GOAL #4   Title  Pt will be independent in a home exercise program for continued strengthening and stretching    Time  6    Period  Weeks    Status  New    Target Date  10/31/18            Plan - 10/02/18 1220    Clinical Impression Statement  Has not purchased TENS unit yet but reports her feet seem to be feeling better.  Fullerton balance scale completed today as balance has not been foramlly assessed yet with difficulty on head turns with gait, and almost falling with small hop due to quad weakness with Rt leg buckling.  Added more high level balance today    PT Frequency  2x / week    PT Duration  6 weeks    PT Treatment/Interventions  ADLs/Self Care Home Management;Electrical Stimulation;Moist Heat;Cryotherapy;Therapeutic exercise;Balance training;Therapeutic activities;Patient/family education;Manual techniques    PT Next Visit Plan  Cont exercise for hips and lower core, higher level balance to include head turns and single leg, R d/f, elliptical,  estim to feet as she reports this felt very helpful. moist heat and massage to feet as indicated.       Patient will benefit from skilled therapeutic  intervention in order to improve the following deficits and impairments:     Visit Diagnosis: 1. Muscle  weakness (generalized)   2. Peripheral neuropathy due to chemotherapy ([place])   3. Difficulty in walking, not elsewhere classified   4. Other muscle spasm        Problem List Patient Active Problem List   Diagnosis Date Noted  . Neuropathy due to chemotherapeutic drug ([place]) 07/24/2018  . Anemia due to antineoplastic chemotherapy 05/22/2018  . Port-A-Cath in place 05/01/2018  . Neutropenia, drug-induced ([place]) 03/10/2018  . Diarrhea 08/02/2017  . Lichen sclerosus et atrophicus of the vulva 03/31/2016  . Abdominal wall mass of right lower quadrant 03/08/2016  . 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 Goals of care, counseling/discussion 02/27/2016  . Elevated CA-125 02/24/2016  . Vaginal dryness, menopausal 10/01/2015  . Drug-induced peripheral neuropathy ([place]) 09/02/2015  . Leukopenia due to antineoplastic chemotherapy ([place]) 09/02/2015  . Chemotherapy-induced neuropathy ([place]) 08/19/2015  . Cellulitis 07/03/2015  . Acute sensory neuropathy 06/26/2015  . High risk medication use 06/21/2015  . Facial paresthesia 06/18/2015  . Genetic testing 06/16/2015  . Restless legs 05/14/2015  . Family history of breast cancer in female 04/29/2015  . Chemotherapy-induced nausea 04/22/2015  . Chemotherapy induced neutropenia ([place]) 04/22/2015  . Cancer of right fallopian tube ([place]) 04/07/2015  . History of hysterectomy for benign disease 03/21/2015  . IBS (irritable bowel syndrome) 03/21/2015  . Dense breast tissue 03/21/2015  . History of cholecystectomy 03/21/2015  . Abdominal carcinomatosis ([place]) 03/21/2015    Stark Bray 10/02/2018, 12:23 PM  [place], [place], [address] Phone: [phone]   Fax:  [phone]  Name: Kathryn Ramsey MRN: [id number] Date of Birth: 05/25/1965

## 2018-10-03 ENCOUNTER — Inpatient Hospital Stay: Payer: 59 | Admitting: Adult Health

## 2018-10-03 ENCOUNTER — Inpatient Hospital Stay: Payer: 59

## 2018-10-04 ENCOUNTER — Other Ambulatory Visit: Payer: Self-pay

## 2018-10-04 ENCOUNTER — Ambulatory Visit: Payer: 59

## 2018-10-04 DIAGNOSIS — M6281 Muscle weakness (generalized): Secondary | ICD-10-CM

## 2018-10-04 DIAGNOSIS — G620 Drug-induced polyneuropathy: Secondary | ICD-10-CM

## 2018-10-04 DIAGNOSIS — M62838 Other muscle spasm: Secondary | ICD-10-CM

## 2018-10-04 DIAGNOSIS — R262 Difficulty in walking, not elsewhere classified: Secondary | ICD-10-CM

## 2018-10-04 NOTE — Therapy (Signed)
[place], [place], [address] Phone: [phone]   Fax:  [phone]  Physical Therapy Treatment  Patient Details  Name: Kathryn Ramsey MRN: [id number] Date of Birth: [birth year] Referring Provider (PT): Causey   Encounter Date: 10/04/2018  PT End of Session - 10/04/18 1200    Visit Number  5    Number of Visits  11    Date for PT Re-Evaluation  10/31/18    PT Start Time  1107    PT Stop Time  1152    PT Time Calculation (min)  45 min    Activity Tolerance  Patient tolerated treatment well    Behavior During Therapy  [facility] for tasks assessed/performed       Past Medical History:  Diagnosis Date  . Acute sensory neuropathy 06/26/2015  . Allergy   . Anemia   . Asthma    illness induced asthma  . Blood transfusion without reported diagnosis    at age 53 years old D/T surgery to femur being crushed  . 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 Heart murmur    pt, states had a "working heart murmur"  . History of kidney stones   . Neutropenia, drug-induced ([place]) 03/10/2018  . PONV (postoperative nausea and vomiting)   . Skin cancer     Past Surgical History:  Procedure Laterality Date  . 3 laporscopic proceedures    . ABDOMINAL HYSTERECTOMY     2010  . CHOLECYSTECTOMY     2010  . DILATION AND CURETTAGE OF UTERUS     1997  . IR IMAGING GUIDED PORT INSERTION  03/08/2018  . LAPAROSCOPY N/A 03/27/2015   Procedure: DIAGNOSTIC LAPAROSCOPY ;  Surgeon: [name], MD;  Location: [facility];  Service: Gynecology;  Laterality: N/A;  . LAPAROSCOPY N/A 02/24/2016   Procedure: LAPAROSCOPY DIAGNOSTIC WITH PERITONEAL WASHINGS AND PERITONEAL BIOPSY;  Surgeon: [name], MD;  Location: [facility];  Service: Gynecology;  Laterality: N/A;  . LAPAROTOMY N/A 03/27/2015   Procedure: EXPLORATORY LAPAROTOMY, BILATERAL SALPINGO OOPHORECTOMY, OMENTECTOMY, RADICAL  TUMOR DEBULKING;  Surgeon: [name], MD;  Location: [facility];  Service: Gynecology;  Laterality: N/A;  . sinus surgery 1994      There were no vitals filed for this visit.  Subjective Assessment - 10/04/18 1109    Subjective  I'm taking a break from some of my meds but talking with my doctor about that. Overall I am feeling better and glad I was able to come to therapy.    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  No/denies                       [facility] Adult PT Treatment/Exercise - 10/04/18 0001      Neuro Re-ed    Neuro Re-ed Details   In // bars: Front and back heel-toe walking with eyes closed and fingertips along bars during 2x each; SLS on green oval for heel raises x20 each side with gastroc stretch after each; slow and controlled high knee marching with head turns 4x each side; on SoftBoard with eyes open, then with ball toss for 1  min, 2x with standing rest between sets; then walking along edge of black peds mat on incline with Rt and Lt head turns 2x in each direction, this was very challenging for pt and SBA throughout      Knee/Hip Exercises: Stretches   Passive Hamstring Stretch  Right;Left;2 reps;20 seconds   seated edge of chair    Hip Flexor Stretch  Right;Left;2 reps;10 seconds    Piriformis Stretch  Right;Left;2 reps;10 seconds   holding // bars     Knee/Hip Exercises: Aerobic   Elliptical  quickstart x 5 min on ortho side. SOB after and just walked a few mins at slow pace to cool down       Knee/Hip Exercises: Machines for Strengthening   Total Gym Leg Press  60# 2x20, then toe raises on 40#     Hip Cybex  Extension 25# bil x10, then Rt abduction 25# and Lt 12.5# with bil tactile cuing for technique to keep  hips level      Hand Exercises   Digiticizer  1 min with whole grip using DigiFlex x1 min each, fatiguing for pt    Other Hand Exercises  Tried finger web (red) but pt reports not very challenging                  PT Long Term Goals - 09/19/18 1220      PT LONG TERM GOAL #1   Title  Pt will report a 50% improvement in pain and tingling in bilateral feet to allow improved comfort    Time  6    Period  Weeks    Status  New    Target Date  10/31/18      PT LONG TERM GOAL #2   Title  Pt will demonstrate 5/5 left hamstring strength to allow pt to return to PLOF    Baseline  4/5    Time  6    Period  Weeks    Status  New    Target Date  10/31/18      PT LONG TERM GOAL #3   Title  Pt will demonstrate 5/5 R dorisflexor strength to decrase fall risk    Baseline  4/5    Time  6    Period  Weeks    Status  New    Target Date  10/31/18      PT LONG TERM GOAL #4   Title  Pt will be independent in a home exercise program for continued strengthening and stretching    Time  6    Period  Weeks    Status  New    Target Date  10/31/18            Plan - 10/04/18 1201    Clinical Impression Statement  Pt reports feeling very challenged by all higher level balance activities today, also with hip cybex exercises. Multiple VCs today for core and gluteal engagement for increased core stability and SBA required with all balance activities though pt did not have any LOB that she couldn't self correct when using // bars. Also briefly at end of session educated her on some more hand strengthening tools she could look intp getting online like a finger web and a DigiFlex, which she was able to try today. Pt continues to be very encouraged by all progress she has been making in therapy.    Personal Factors and Comorbidities  Time since onset of injury/illness/exacerbation;Comorbidity 1    Comorbidities  cancer with multiple recurrences    Examination-Participation Restrictions   Community Activity    Stability/Clinical Decision Making  Evolving/Moderate complexity    Rehab Potential  Good    PT Frequency  2x / week    PT Duration  6 weeks    PT Treatment/Interventions  ADLs/Self Care Home Management;Electrical Stimulation;Moist Heat;Cryotherapy;Therapeutic exercise;Balance training;Therapeutic activities;Patient/family education;Manual techniques    PT Next Visit Plan  Cont exercise for hips and lower core, higher level balance to include head turns and single leg, R d/f, elliptical,  estim to feet as she reports this felt very helpful. moist heat and massage to feet as indicated.    PT Home Exercise Plan  Sent information to Wilber Bihari about getting a neuropathy cream ( see Marcene Brawn for more details)    Consulted and Agree with Plan of Care  Patient       Patient will benefit from skilled therapeutic intervention in order to improve the following deficits and impairments:  Impaired sensation, Decreased strength, Pain  Visit Diagnosis: 1. Muscle weakness (generalized)   2. Peripheral neuropathy due to chemotherapy ([place])   3. Difficulty in walking, not elsewhere classified   4. Other muscle spasm        Problem List Patient Active Problem List   Diagnosis Date Noted  . Neuropathy due to chemotherapeutic drug ([place]) 07/24/2018  . Anemia due to antineoplastic chemotherapy 05/22/2018  . Port-A-Cath in place 05/01/2018  . Neutropenia, drug-induced ([place]) 03/10/2018  . Diarrhea 08/02/2017  . Lichen sclerosus et atrophicus of the vulva 03/31/2016  . Abdominal wall mass of right lower quadrant 03/08/2016  . 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 Goals of care, counseling/discussion 02/27/2016  . Elevated CA-125 02/24/2016  . Vaginal dryness, menopausal 10/01/2015  . Drug-induced peripheral neuropathy ([place]) 09/02/2015  . Leukopenia due to antineoplastic chemotherapy ([place]) 09/02/2015  . Chemotherapy-induced neuropathy ([place]) 08/19/2015  . Cellulitis  07/03/2015  . Acute sensory neuropathy 06/26/2015  . High risk medication use 06/21/2015  . Facial paresthesia 06/18/2015  . Genetic testing 06/16/2015  . Restless legs 05/14/2015  . Family history of breast cancer in female 04/29/2015  . Chemotherapy-induced nausea 04/22/2015  . Chemotherapy induced neutropenia ([place]) 04/22/2015  . Cancer of right fallopian tube ([place]) 04/07/2015  . History of hysterectomy for benign disease 03/21/2015  . IBS (irritable bowel syndrome) 03/21/2015  . Dense breast tissue 03/21/2015  . History of cholecystectomy 03/21/2015  . Abdominal carcinomatosis ([place]) 03/21/2015    Otelia Limes, PTA 10/04/2018, 12:07 PM  [place], [place], [address] Phone: [phone]   Fax:  [phone]  Name: Sherica Paternostro MRN: [id number] Date of Birth: 09-05-1965

## 2018-10-10 ENCOUNTER — Ambulatory Visit: Payer: 59 | Admitting: Physical Therapy

## 2018-10-10 ENCOUNTER — Encounter: Payer: Self-pay | Admitting: Physical Therapy

## 2018-10-10 ENCOUNTER — Other Ambulatory Visit: Payer: Self-pay

## 2018-10-10 DIAGNOSIS — M6281 Muscle weakness (generalized): Secondary | ICD-10-CM

## 2018-10-10 DIAGNOSIS — G620 Drug-induced polyneuropathy: Secondary | ICD-10-CM

## 2018-10-10 DIAGNOSIS — T451X5A Adverse effect of antineoplastic and immunosuppressive drugs, initial encounter: Secondary | ICD-10-CM

## 2018-10-10 DIAGNOSIS — R262 Difficulty in walking, not elsewhere classified: Secondary | ICD-10-CM

## 2018-10-10 NOTE — Therapy (Signed)
[place], [place], [address] Phone: [phone]   Fax:  [phone]  Physical Therapy Treatment  Patient Details  Name: Kathryn Ramsey MRN: [id number] Date of Birth: 07-31-1965 Referring Provider (PT): Causey   Encounter Date: 10/10/2018  PT End of Session - 10/10/18 1125    Visit Number  6    Number of Visits  11    Date for PT Re-Evaluation  10/31/18    PT Start Time  1031    PT Stop Time  1117    PT Time Calculation (min)  46 min    Activity Tolerance  Patient tolerated treatment well    Behavior During Therapy  [facility] for tasks assessed/performed       Past Medical History:  Diagnosis Date  . Acute sensory neuropathy 06/26/2015  . Allergy   . Anemia   . Asthma    illness induced asthma  . Blood transfusion without reported diagnosis    at age 53 years old D/T surgery to femur being crushed  . Complication of anesthesia    hx. of allergic to ether (had surgery at 53 years and had reaction to the ether  . Heart murmur    pt, states had a "working heart murmur"  . History of kidney stones   . Neutropenia, drug-induced ([place]) 03/10/2018  . PONV (postoperative nausea and vomiting)   . Skin cancer     Past Surgical History:  Procedure Laterality Date  . 3 laporscopic proceedures    . ABDOMINAL HYSTERECTOMY     2010  . CHOLECYSTECTOMY     2010  . DILATION AND CURETTAGE OF UTERUS     1997  . IR IMAGING GUIDED PORT INSERTION  03/08/2018  . LAPAROSCOPY N/A 03/27/2015   Procedure: DIAGNOSTIC LAPAROSCOPY ;  Surgeon: [name], MD;  [facility];  Service: Gynecology;  Laterality: N/A;  . LAPAROSCOPY N/A 02/24/2016   Procedure: LAPAROSCOPY DIAGNOSTIC WITH PERITONEAL WASHINGS AND PERITONEAL BIOPSY;  Surgeon: [name], MD;  Location: [facility];  Service: Gynecology;  Laterality: N/A;  . LAPAROTOMY N/A 03/27/2015   Procedure: EXPLORATORY LAPAROTOMY, BILATERAL SALPINGO OOPHORECTOMY, OMENTECTOMY, RADICAL  TUMOR DEBULKING;  Surgeon: [name], MD;  Location: [facility];  Service: Gynecology;  Laterality: N/A;  . sinus surgery 1994      There were no vitals filed for this visit.  Subjective Assessment - 10/10/18 1033    Subjective  Since I saw you last I have seen improvement. I have had a lot of good appointments. I take my gabapentin at dinner time and I feel much better when I try to go to sleep. I am still having neuropathy but it is some better.    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  Yes    Pain Score  3     Pain Location  Abdomen    Pain Descriptors / Indicators  Discomfort                       [facility] Adult PT Treatment/Exercise - 10/10/18 0001      Neuro Re-ed    Neuro Re-ed Details   On balance foam: eyes closed x 30 sec, 3 way hip with no resistance x 10  reps in each direction (pt had difficulty with this and increased difficulty while standing on R), ball toss x 1 min, marching x 10 reps each foot; then down hall way with SBA: tandem walking in forward and backwards direction x 2, heel walking x 2, toe walking x 2, braiding x 2 each direction      Knee/Hip Exercises: Stretches   Passive Hamstring Stretch  Right;Left;1 rep;30 seconds   seated edge of chair    Hip Flexor Stretch  Right;Left;1 rep;30 seconds   standing runner stretch   Piriformis Stretch  Right;Left;1 rep;30 seconds   seated edge of chair     Knee/Hip Exercises: Aerobic   Elliptical  quickstart x 5 min 25 sec RPE 7-8, pt had a seated recovery period at the end      Knee/Hip Exercises: Machines for Strengthening   Total Gym Leg Press  60# 2x20, then toe raises on 40#     Hip Cybex  Extension 25# bil x10, then Rt abduction 25# and Lt 25# with bil  tactile cuing for technique to keep hips level      Hand Exercises   Hand Gripper with Medium Beads  1 min on hand gripper with medium rubber band bilaterally                  PT Long Term Goals - 09/19/18 1220      PT LONG TERM GOAL #1   Title  Pt will report a 50% improvement in pain and tingling in bilateral feet to allow improved comfort    Time  6    Period  Weeks    Status  New    Target Date  10/31/18      PT LONG TERM GOAL #2   Title  Pt will demonstrate 5/5 left hamstring strength to allow pt to return to PLOF    Baseline  4/5    Time  6    Period  Weeks    Status  New    Target Date  10/31/18      PT LONG TERM GOAL #3   Title  Pt will demonstrate 5/5 R dorisflexor strength to decrase fall risk    Baseline  4/5    Time  6    Period  Weeks    Status  New    Target Date  10/31/18      PT LONG TERM GOAL #4   Title  Pt will be independent in a home exercise program for continued strengthening and stretching    Time  6    Period  Weeks    Status  New    Target Date  10/31/18            Plan - 10/10/18 1125    Clinical Impression Statement  Pt feels that PT is helping her and she is realizing how much she can do physically. Continued to work on neuro re ed exercises to improve balance. Began working on high level balance activities in hall today instead of parallel bars and pt did well with this and did not require any assist. She was also able to tolerate increased resistance on left hip exercises.    Stability/Clinical Decision Making  Evolving/Moderate complexity    Rehab Potential  Good    PT Frequency  2x / week    PT Duration  6 weeks    PT Treatment/Interventions  ADLs/Self Care Home Management;Electrical Stimulation;Moist Heat;Cryotherapy;Therapeutic exercise;Balance training;Therapeutic activities;Patient/family education;Manual techniques  PT Next Visit Plan  pt would like to do TENS again, Cont exercise for hips and lower core, higher  level balance to include head turns and single leg, R d/f, elliptical,  estim to feet as she reports this felt very helpful. moist heat and massage to feet as indicated.    Consulted and Agree with Plan of Care  Patient       Patient will benefit from skilled therapeutic intervention in order to improve the following deficits and impairments:  Impaired sensation, Decreased strength, Pain  Visit Diagnosis: Muscle weakness (generalized)  Peripheral neuropathy due to chemotherapy ([place])  Difficulty in walking, not elsewhere classified     Problem List Patient Active Problem List   Diagnosis Date Noted  . Neuropathy due to chemotherapeutic drug ([place]) 07/24/2018  . Anemia due to antineoplastic chemotherapy 05/22/2018  . Port-A-Cath in place 05/01/2018  . Neutropenia, drug-induced ([place]) 03/10/2018  . Diarrhea 08/02/2017  . Lichen sclerosus et atrophicus of the vulva 03/31/2016  . Abdominal wall mass of right lower quadrant 03/08/2016  . 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 Goals of care, counseling/discussion 02/27/2016  . Elevated CA-125 02/24/2016  . Vaginal dryness, menopausal 10/01/2015  . Drug-induced peripheral neuropathy ([place]) 09/02/2015  . Leukopenia due to antineoplastic chemotherapy ([place]) 09/02/2015  . Chemotherapy-induced neuropathy ([place]) 08/19/2015  . Cellulitis 07/03/2015  . Acute sensory neuropathy 06/26/2015  . High risk medication use 06/21/2015  . Facial paresthesia 06/18/2015  . Genetic testing 06/16/2015  . Restless legs 05/14/2015  . Family history of breast cancer in female 04/29/2015  . Chemotherapy-induced nausea 04/22/2015  . Chemotherapy induced neutropenia ([place]) 04/22/2015  . Cancer of right fallopian tube ([place]) 04/07/2015  . History of hysterectomy for benign disease 03/21/2015  . IBS (irritable bowel syndrome) 03/21/2015  . Dense breast tissue 03/21/2015  . History of cholecystectomy 03/21/2015  . Abdominal carcinomatosis ([place]) 03/21/2015     Allyson Sabal [facility] 10/10/2018, 11:29 AM  [place], [place], [address] Phone: [phone]   Fax:  [phone]  Name: Catisha Curcuru MRN: [id number] Date of Birth: 06-20-1965  Manus Gunning, PT 10/10/18 11:29 AM

## 2018-10-12 ENCOUNTER — Ambulatory Visit: Payer: 59

## 2018-10-12 ENCOUNTER — Other Ambulatory Visit: Payer: Self-pay

## 2018-10-12 DIAGNOSIS — R262 Difficulty in walking, not elsewhere classified: Secondary | ICD-10-CM

## 2018-10-12 DIAGNOSIS — G620 Drug-induced polyneuropathy: Secondary | ICD-10-CM

## 2018-10-12 DIAGNOSIS — M62838 Other muscle spasm: Secondary | ICD-10-CM

## 2018-10-12 DIAGNOSIS — M6281 Muscle weakness (generalized): Secondary | ICD-10-CM | POA: Diagnosis not present

## 2018-10-12 DIAGNOSIS — T451X5A Adverse effect of antineoplastic and immunosuppressive drugs, initial encounter: Secondary | ICD-10-CM

## 2018-10-12 NOTE — Therapy (Signed)
[facility] [place], [place], [address] Phone: [phone]   Fax:  [phone]  Physical Therapy Treatment  Patient Details  Name: Kathryn Ramsey MRN: [id number] Date of Birth: 24-Jul-1965 Referring Provider (PT): Causey   Encounter Date: 10/12/2018  PT End of Session - 10/12/18 0818    Visit Number  7    Number of Visits  11    Date for PT Re-Evaluation  10/31/18    PT Start Time  0803    PT Stop Time  0903    PT Time Calculation (min)  60 min    Activity Tolerance  Patient tolerated treatment well    Behavior During Therapy  [facility] for tasks assessed/performed       Past Medical History:  Diagnosis Date  . Acute sensory neuropathy 06/26/2015  . Allergy   . Anemia   . Asthma    illness induced asthma  . Blood transfusion without reported diagnosis    at age 69 years old D/T surgery to femur being crushed  . 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 Heart murmur    pt, states had a "working heart murmur"  . History of kidney stones   . Neutropenia, drug-induced ([place]) 03/10/2018  . PONV (postoperative nausea and vomiting)   . Skin cancer     Past Surgical History:  Procedure Laterality Date  . 3 laporscopic proceedures    . ABDOMINAL HYSTERECTOMY     2010  . CHOLECYSTECTOMY     2010  . DILATION AND CURETTAGE OF UTERUS     1997  . IR IMAGING GUIDED PORT INSERTION  03/08/2018  . LAPAROSCOPY N/A 03/27/2015   Procedure: DIAGNOSTIC LAPAROSCOPY ;  Surgeon: [name], MD;  Location: [facility];  Service: Gynecology;  Laterality: N/A;  . LAPAROSCOPY N/A 02/24/2016   Procedure: LAPAROSCOPY DIAGNOSTIC WITH PERITONEAL WASHINGS AND PERITONEAL BIOPSY;  Surgeon: [name], MD;  Location: [facility];  Service: Gynecology;  Laterality: N/A;  . LAPAROTOMY N/A 03/27/2015   Procedure: EXPLORATORY LAPAROTOMY, BILATERAL SALPINGO OOPHORECTOMY, OMENTECTOMY, RADICAL  TUMOR DEBULKING;  Surgeon: [name], MD;  Location: [facility];  Service: Gynecology;  Laterality: N/A;  . sinus surgery 1994      There were no vitals filed for this visit.  Subjective Assessment - 10/12/18 0804    Subjective  My feet ended up hurting alot the night of my last session but I don't think it was from therapy. I was with a few of my girlfriends that night and had 2 glassess of wine which had sugar and I really feel like that is what made me worse. I read that sugar can make neuropathy worse so I think it was more than the wine than anything we did in therapy.    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  No/denies                       [facility] Adult PT Treatment/Exercise - 10/12/18 0001      Neuro Re-ed    Neuro Re-ed Details   On balance foam: eyes closed and alt marching 2 x 30 sec with  SBA and near corner of wall, 3 way hip with no resistance x 10 reps in each direction (pt had difficulty with this and increased difficulty while standing on R), ball toss x 1 min once with normal BOS, then 1x with narrow BOS,      Knee/Hip Exercises: Aerobic   Elliptical  QuickStart (on cancer rehab side) x6 mins on incline 5      Knee/Hip Exercises: Machines for Strengthening   Total Gym Leg Press  60# 2x20, then toe raises on 60#, 2x20     Hip Cybex  Bil hip 3 way raises, 25# x10 each and tactile and VCs for correct pelvic alignment and core engagment throuhgout for correct technique      Electrical Stimulation   Electrical Stimulation Location  Plantar surface; and dorsal hands/wrist    Electrical Stimulation Action  IFC using 2 cords, 1 channel per limb    Electrical Stimulation Parameters  80-150 Hz, x15 mins on feet, 10 mins on  hand (hand to go next door to borrow other cord). Pt with good sensation at each area, but reports better sensation at hands    Electrical Stimulation Goals  Pain                  PT Long Term Goals - 09/19/18 1220      PT LONG TERM GOAL #1   Title  Pt will report a 50% improvement in pain and tingling in bilateral feet to allow improved comfort    Time  6    Period  Weeks    Status  New    Target Date  10/31/18      PT LONG TERM GOAL #2   Title  Pt will demonstrate 5/5 left hamstring strength to allow pt to return to PLOF    Baseline  4/5    Time  6    Period  Weeks    Status  New    Target Date  10/31/18      PT LONG TERM GOAL #3   Title  Pt will demonstrate 5/5 R dorisflexor strength to decrase fall risk    Baseline  4/5    Time  6    Period  Weeks    Status  New    Target Date  10/31/18      PT LONG TERM GOAL #4   Title  Pt will be independent in a home exercise program for continued strengthening and stretching    Time  6    Period  Weeks    Status  New    Target Date  10/31/18            Plan - 10/12/18 0819    Clinical Impression Statement  Continued with bil LE strength and endurance with high level balance activities as well. Also continued with e stim today as pt reports this beneficial. Was able to get extra cord for e stim next door and do her hands as well which pt was grateful for and reports noting good benefit after session. Pt conts noting good improvement after each session with balance and has noted some decrease overall in CIPN symptoms.    Personal Factors and Comorbidities  Time since onset of injury/illness/exacerbation;Comorbidity 1    Comorbidities  cancer with multiple recurrences    Examination-Participation Restrictions  Community Activity    Stability/Clinical Decision Making  Evolving/Moderate complexity    Rehab Potential  Good    PT Frequency  2x /  week    PT Duration  6 weeks    PT Treatment/Interventions  ADLs/Self Care  Home Management;Electrical Stimulation;Moist Heat;Cryotherapy;Therapeutic exercise;Balance training;Therapeutic activities;Patient/family education;Manual techniques    PT Next Visit Plan  Pt would like to cont e stim at end of session to feet (and hands if therapist has time to get extra cord from next door), Cont exercise for hips and lower core, higher level balance to include head turns and single leg, R d/f, elliptical,  estim to feet as she reports this felt very helpful. moist heat and massage to feet as indicated.    PT Home Exercise Plan  Sent information to Wilber Bihari about getting a neuropathy cream ( see Marcene Brawn for more details)    Consulted and Agree with Plan of Care  Patient       Patient will benefit from skilled therapeutic intervention in order to improve the following deficits and impairments:  Impaired sensation, Decreased strength, Pain  Visit Diagnosis: Muscle weakness (generalized)  Peripheral neuropathy due to chemotherapy ([place])  Difficulty in walking, not elsewhere classified  Other muscle spasm     Problem List Patient Active Problem List   Diagnosis Date Noted  . Neuropathy due to chemotherapeutic drug ([place]) 07/24/2018  . Anemia due to antineoplastic chemotherapy 05/22/2018  . Port-A-Cath in place 05/01/2018  . Neutropenia, drug-induced ([place]) 03/10/2018  . Diarrhea 08/02/2017  . Lichen sclerosus et atrophicus of the vulva 03/31/2016  . Abdominal wall mass of right lower quadrant 03/08/2016  . 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 Goals of care, counseling/discussion 02/27/2016  . Elevated CA-125 02/24/2016  . Vaginal dryness, menopausal 10/01/2015  . Drug-induced peripheral neuropathy ([place]) 09/02/2015  . Leukopenia due to antineoplastic chemotherapy ([place]) 09/02/2015  . Chemotherapy-induced neuropathy ([place]) 08/19/2015  . Cellulitis 07/03/2015  . Acute sensory neuropathy 06/26/2015  . High risk medication use 06/21/2015  . Facial paresthesia  06/18/2015  . Genetic testing 06/16/2015  . Restless legs 05/14/2015  . Family history of breast cancer in female 04/29/2015  . Chemotherapy-induced nausea 04/22/2015  . Chemotherapy induced neutropenia ([place]) 04/22/2015  . Cancer of right fallopian tube ([place]) 04/07/2015  . History of hysterectomy for benign disease 03/21/2015  . IBS (irritable bowel syndrome) 03/21/2015  . Dense breast tissue 03/21/2015  . History of cholecystectomy 03/21/2015  . Abdominal carcinomatosis ([place]) 03/21/2015    Otelia Limes, PTA 10/12/2018, 9:01 AM  [facility] [place], [place], [address] Phone: [phone]   Fax:  [phone]  Name: Kathryn Ramsey MRN: [id number] Date of Birth: [birth year]

## 2018-10-16 ENCOUNTER — Other Ambulatory Visit: Payer: Self-pay | Admitting: Oncology

## 2018-10-16 NOTE — Progress Notes (Signed)
I spoke with Kathryn Ramsey.  She is considering an [id number] +/-pembrolizumab trial at [facility].  I encouraged her to participate.  She understands that she is now mildly symptomatic but may become severely symptomatic in perhaps 2 months and would then require chemo which would not allow her to participate in the trial.  She also understands that we do have more chemo we can give her but as time goes on the chance of a good response diminishes and the duration of any response also diminishes.   She has a small window at this point and I recommended she take it.

## 2018-10-17 ENCOUNTER — Encounter: Payer: 59 | Admitting: Physical Therapy

## 2018-10-18 ENCOUNTER — Inpatient Hospital Stay: Payer: 59 | Attending: Oncology | Admitting: Gynecologic Oncology

## 2018-10-18 DIAGNOSIS — C786 Secondary malignant neoplasm of retroperitoneum and peritoneum: Secondary | ICD-10-CM | POA: Diagnosis not present

## 2018-10-18 DIAGNOSIS — C5701 Malignant neoplasm of right fallopian tube: Secondary | ICD-10-CM

## 2018-10-18 DIAGNOSIS — R971 Elevated cancer antigen 125 [CA 125]: Secondary | ICD-10-CM | POA: Diagnosis not present

## 2018-10-18 NOTE — Progress Notes (Signed)
Gynecologic Oncology Telehealth Follow-up Note: Gyn-Onc  I connected with Kathryn Ramsey on 11/03/18 at  4:15 PM EDT by telephone and verified that I am speaking with the correct person using two identifiers.  I discussed the limitations, risks, security and privacy concerns of performing an evaluation and management service by telemedicine and the availability of in-person appointments. I also discussed with the patient that there may be a patient responsible charge related to this service. The patient expressed understanding and agreed to proceed.  Other persons participating in the visit and their role in the encounter: husband, listening in.  Patient's location: home Provider's location: [place]  Chief Complaint:  Chief Complaint  Patient presents with  . Ovarian Cancer   Assessment:    53 y.o. 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   Receiving salvage therapy with abraxane (6th line).   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  New relapse after 6th line therapy with progression of peritoneal disease (perihepatic) and CA 125 elevations.   Plan: I think that it is a good idea to attempt a non-cytotoxic on a phase I trial. Shaquasha has excellent performance status. She is highly motivated to try a clinical trial and she is a candidate for a novel immunotherapy trial. She has quality of life interrupting/limiting neuropathy, and therefore a break from cytotoxic regimens that would exacerbate this is welcome.   I reviewed her recent treatment regimens, looked at the CT scan and PET images and counseled her regarding my encouragement to explore novel therapeutics.   HPI/Tumor history:  Kathryn Ramsey is a very pleasant 53 year old G4P4 who was originally seen in consultation on 03/17/15 at the request of Dr Collene Mares and Dr Garwin Brothers for peritoneal carcinomatosis. The  patient had a history of a workup by urology for gross hematuria which included a pelvic examine was suggested for extrinsic mass effect on the bladder on cystoscopy. Cystoscopy took place on in December 2016. The patient denied abdominal pain bloating early satiety or abdominal distention.  On 08/03/2015 at [facility] she underwent a CT scan of the abdomen and pelvis as ordered by Dr Baruch Gouty. This revealed a 1.4 cm low attenuation lesion on the posterior right hepatic lobe suggestive of a hemangioma. Status post hysterectomy. No ovarian masses. Moderate ascites. Omental caking seen in the lateral left abdomen and pelvis. Peritoneal nodularity in the left lateral pelvic cul-de-sac. No gross extrinsic compression on the bladder was identified on imaging.  The patient was then seen by her gastroenterologist, Dr. Collene Mares, who performed a colonoscopy which was unremarkable.  Tumor markers were drawn on 08/04/2015 and these included a CA-125 that was elevated to 957, and a CEA that was normal at 1.  The patient was otherwise a very healthy woman. She is an Futures trader. She has had a history of 4 spontaneous vaginal deliveries. She had a remote history of endometriosis. She had a limited history of oral contraceptive pill usage (possibly 2 months). She a history of primary infertility and was treated with Clomid for her first pregnancy.  Her prior endometriosis was identified and treated with 3 laparoscopies. Her only other abdominal surgery was a laparoscopic cholecystectomy, and an LAVH on March 15th 2010 with removal of a right paratubal cyst for a fibroid uterus and menorrhagia. This was performed by Dr. Servando Salina.   She has no remarkable history for malignancy. Her maternal aunt had  non-hodgkins lymphoma.  On 03/27/15 she underwent diagnostic laparoscopy, exploratory laparotomy, BSO, omentectomy, argon beam ablation of tumor implants for an optimal cytoreduction (R0) for high grade  serous fallopian tube cancer. Final pathology confirmed that her primary tumor was the right fallopian tube.  She had an uncomplicated postoperative stay in hospital and was discharged on POD 2.  She was evaluated in the office on POD 7 for increased abdominal discomfort and feeling a protrusion in her upper abdomen.CT scan confirmed no hernia.  She went on to receive 6 cycles of carboplatin and paclitaxel adjuvant therapy between 04/14/15 and 08/11/15. She developed severe neuropathy symptoms which necessitated treatment delay of cycle 5 and weekly scheduling of taxol on cycle 6.  CA 125 normalized quickly during primary treatment:  1226 on 03/20/15 165 on 04/28/15 44 on 05/22/15 26.1 on 06/16/15 18.7 on 08/11/15 17 on 7/17/7 15 on 09/29/15.  Post-treatment imaging with CT chest,abdo, pelvis on 09/29/15 showed complete resolution of ascites and peritoneal implants. There was a stable 1.5cm liver hemangioma present.  On 01/06/16 she felt unwell with a headache and requested labs. On 01/07/16 CA 125 was noted to be elevated at 69.1.  On 01/16/16 she had a CT scan which showed no measurable recurrence. On 01/26/16 a PET/CT showed no measurable recurrence.  Repeat CA 125 on 02/13/16 was substantially elevated at 914.   On 02/24/16 she was taken to the operating room for a diagnostic laparoscopy which confirmed low volume carcinomatosis. This was biopsied and confirmed to be high grade serous carcinoma, consistent with recurrent ovarian cancer.  Since 02/28/15 she began noticing increasing firmness and lumps around the 2 right lower quadrant 35m port sites. These areas are not very painful, not draining, normal overlying skin, but slowly progressing in size. Ultrasound on 03/11/16: Limited sonographic evaluation in area of palpable concern in right lower quadrant of abdomen demonstrates complex abnormality with cystic component measuring 3.4 x 2.2 x 0.8 cm. Another complex abnormality measuring 3.2 x 2.6 x  1.0 cm is noted just inferior to the other abnormality. It is uncertain if these abnormalities represent possible hernias or masses.  Salvage (second line) Carboplatin and Doxil started 03/05/16.  CA 125 was 577 on day 1 of cycle 1 (03/05/16). Cycle 4 carb/Doxil was given on 06/04/16.  On 06/18/16 CA 125 had normalized to 27. CT abdo/pelvis on 06/17/16 showed no measurable disease, though I can appreciate the appearance of the abdominal wall lesions on the imaging.   She was started on Rucaparib PARP inhibitor maintenance therapy in May, 2018 which she tolerated well.  Follow-up CT abdo/pelvis in August, 2018 showed no measurable disease. CA 125 on 09/29/16: 23 CA 125 on 11/01/16: 29.5 CA 125 on 11/24/16: 43 representing possible recurrence and Rucaparib was discontinued.  Imaging CT abd/pelvis 11/30/17 showed slight growth of a 0.9 cm left paracolic gutter nodule, compatible with recurrent peritoneal metastasis. No additional discrete peritoneal nodules. No ascites. No adnexal masses.  She was prescribed anastrozole therapy between November 8th, 2018- November 30th, 2018.  Gembcitabine/Bevacizumab was started 02/04/17 x 6 cycles until 5/10/1.  CA 125 initially responded but then increased on cycle 5 and 6 demonstrating progression. CA 125 on 06/17/16 was 609.9.  Foundation One testing performed - no actionable mutations.  Abraxane (weekly) was started on 07/12/17. CA 125 responded and was 365 on 07/26/17, 217 on 08/09/17, 114 on 08/16/17, 49 on 09/12/17, 39.5 on 10/10/17. On 11/07/17 the CA 125 elevated to 39 and therefore a CT of the abdomen  and pelvis 11/28/2017 was performed but showed no measurable disease.  PET scan 01/25/2018 and MRI of the pelvis 02/03/2018 showed no measurable disease.  Interval Hx: Abraxane was discontinued on 11/15/2017 (after a total of 5 cycles) after confirmation of disease progression with rapidly rising CA 125 (1,372 on 01/17/19) and the development of symptomatic  ascites.  She was started on 6th line therapy with cisplatin/gemcitabine days 1 and 8 of each 21-day cycle on 02/18/2018. This was switched to carboplatin/ gemcitabine 07/17/2018 due to neuropathy, and was changed to Q14 days 07/24/2018 due to neutropenia.  PET on 06/21/18 showed overall improvement in peritoneal metastasis compared to PET-CT scan 01/24/2018. Reduced surface area of metabolic peritoneal metastasis along the RIGHT hepatic margin. Resolution of hypermetabolic peritoneal thickening along the inferior margin of the spleen. Marked improvement in hypermetabolic peritoneal thickening and peritoneal fluid within the pelvis. Single solitary new calcified nodule in the deep peritoneal fat LEFT adjacent to the rectum.  If she achieved a CR, the plan was to start niraparib/Zejula 100 mg, 2 tabs daily at the completion of chemotherapy.  CA 125's fell to 99 on 07/03/18, remaining stable in the 100 range through the summer of 2020, however, then began to increase on this regimen, increasing to 147 on 08/21/18 and 258 on 09/11/18 and 373 on 09/25/18. A repeat CT was performed on 09/28/18 which confirmed progression with subtle potential progression along the RIGHT hepatic margin with increase capsular thickening at site of prior hypermetabolic peritoneal metastasis. Two partially calcified peritoneal nodules in the deep pelvis are similar to comparison PET-CT scan and consistent with residual peritoneal carcinoma. No evidence of new peritoneal metastasis in the abdomen pelvis.  She enquired broadly about clinical trials and learnt that she was eligible for the phase I trial of PNT614 +/-pembrolizumab at [facility].  Past Medical History:  Diagnosis Date  . Acute sensory neuropathy 06/26/2015  . Allergy   . Anemia   . Asthma    illness induced asthma  . Blood transfusion without reported diagnosis    at age 28 years old D/T surgery to femur being crushed  . Complication of anesthesia    hx. of allergic  to ether (had surgery at 5 years and had reaction to the ether  . Heart murmur    pt, states had a "working heart murmur"  . History of kidney stones   . Neutropenia, drug-induced ([place]) 03/10/2018  . PONV (postoperative nausea and vomiting)   . Skin cancer    Past Surgical History:  Procedure Laterality Date  . 3 laporscopic proceedures    . ABDOMINAL HYSTERECTOMY     2010  . CHOLECYSTECTOMY     2010  . DILATION AND CURETTAGE OF UTERUS     1997  . IR IMAGING GUIDED PORT INSERTION  03/08/2018  . LAPAROSCOPY N/A 03/27/2015   Procedure: DIAGNOSTIC LAPAROSCOPY ;  Surgeon: [name], MD;  Location: [facility];  Service: Gynecology;  Laterality: N/A;  . LAPAROSCOPY N/A 02/24/2016   Procedure: LAPAROSCOPY DIAGNOSTIC WITH PERITONEAL WASHINGS AND PERITONEAL BIOPSY;  Surgeon: [name], MD;  Location: [facility];  Service: Gynecology;  Laterality: N/A;  . LAPAROTOMY N/A 03/27/2015   Procedure: EXPLORATORY LAPAROTOMY, BILATERAL SALPINGO OOPHORECTOMY, OMENTECTOMY, RADICAL TUMOR DEBULKING;  Surgeon: [name], MD;  Location: [facility];  Service: Gynecology;  Laterality: N/A;  . sinus surgery 1994     Family History  Problem Relation Age of Onset  . Hypertension Father   . Skin cancer Father  nonmelanoma skin cancers in his late 55s  . Non-Hodgkin's lymphoma Maternal Aunt        dx. 28s; smoker  . Stroke Maternal Grandmother   . Other Mother        benign meningioma dx. early-mid-70s; hysterectomy in her late 108s for heavy periods - still has ovaries  . Other Son        one son with pre-cancerous skin findings  . Other Daughter        cysts on ovaries and hx of heavy periods  . Other Sister        hysterectomy for cysts - still has ovaries  . Heart attack Maternal Grandfather   . Heart attack Paternal Grandfather   . Renal cancer Maternal Aunt 60       smoker  . Breast cancer Maternal Aunt 78  . Other Maternal Aunt        dx. benign brain tumor (meningioma) at 7; 2nd benign brain tumor in  her late 29s; hx of radical hysterectomy at age 35  . Brain cancer Other        NOS tumor   Social History   Socioeconomic History  . Marital status: Married    Spouse name: Not on file  . Number of children: 4  . Years of education: 32  . Highest education level: Not on file  Occupational History  . Occupation: Biomedical scientist  Social Needs  . Financial resource strain: Not on file  . Food insecurity    Worry: Not on file    Inability: Not on file  . Transportation needs    Medical: Not on file    Non-medical: Not on file  Tobacco Use  . Smoking status: Never Smoker  . Smokeless tobacco: Never Used  Substance and Sexual Activity  . Alcohol use: Yes    Comment:  3 glasses wine or beer/week  . Drug use: No  . Sexual activity: Not on file  Lifestyle  . Physical activity    Days per week: Not on file    Minutes per session: Not on file  . Stress: Not on file  Relationships  . Social Herbalist on phone: Not on file    Gets together: Not on file    Attends religious service: Not on file    Active member of club or organization: Not on file    Attends meetings of clubs or organizations: Not on file    Relationship status: Not on file  . Intimate partner violence    Fear of current or ex partner: Not on file    Emotionally abused: Not on file    Physically abused: Not on file    Forced sexual activity: Not on file  Other Topics Concern  . Not on file  Social History Narrative   Lives at home w/ husband and children   Right-handed   3 cups caffeine per day   Allergies  Allergen Reactions  . Bee Venom Shortness Of Breath    swelling  . Compazine [Prochlorperazine Edisylate] Anaphylaxis, Anxiety and Palpitations  . Sulfa Antibiotics Shortness Of Breath    Vomit , diarrhea , hives  . Zarxio [Filgrastim]    Current Outpatient Medications on File Prior to Visit  Medication Sig Dispense Refill  . diphenhydrAMINE (BENADRYL) 50 MG capsule 50 mg Diphenhydramine  (Benadryl) PO within 1 hour of the injection 1 capsule 0  . fluticasone (FLONASE) 50 MCG/ACT nasal spray Place 1 spray into  both nostrils 2 (two) times daily. (Patient taking differently: Place 1 spray into both nostrils as needed. ) 16 g 2  . gabapentin (NEURONTIN) 100 MG capsule Take 2 capsules (200 mg total) by mouth 3 (three) times daily. 540 capsule 1  . lidocaine-prilocaine (EMLA) cream APPLY TOPICALLY AS NEEDED. 30 g 0  . loratadine (CLARITIN) 10 MG tablet Take 10 mg by mouth as needed.     . magnesium oxide (MAG-OX) 400 (241.3 Mg) MG tablet Take 1 tablet (400 mg total) by mouth daily. 60 tablet 3  . omeprazole (PRILOSEC) 40 MG capsule Take 1 capsule (40 mg total) by mouth daily. 30 capsule 5  . valACYclovir (VALTREX) 1000 MG tablet Take 1 tablet (1,000 mg total) by mouth daily. 5 tablet 0   Current Facility-Administered Medications on File Prior to Visit  Medication Dose Route Frequency Provider Last Rate Last Dose  . heparin lock flush 100 unit/mL  500 Units Intravenous Once Magrinat, Virgie Dad, MD      . sodium chloride flush (NS) 0.9 % injection 10 mL  10 mL Intravenous PRN Magrinat, Virgie Dad, MD        Review of systems: Constitutional:  She has no weight gain or weight loss. She has no fever or chills. Eyes: No blurred vision Ears, Nose, Mouth, Throat: No dizziness, headaches or changes in hearing. No mouth sores. Cardiovascular: No chest pain, palpitations or edema. Respiratory:  No shortness of breath, wheezing or cough Gastrointestinal: She has normal bowel movements without diarrhea or constipation. She denies any nausea or vomiting. She denies blood in her stool or heart burn. Genitourinary:  She has vague pelvic pain, no pelvic pressure or changes in her urinary function. She has no hematuria, dysuria, or incontinence. She has no irregular vaginal bleeding or vaginal discharge + vaginal dryness Musculoskeletal: Denies muscle weakness or joint pains.  Skin:  She has no skin  changes, rashes or itching Neurological:  + peripheral neuropathy Psychiatric:  She denies depression or anxiety. Hematologic/Lymphatic:   No easy bruising or bleeding   Physical Exam: There were no vitals taken for this visit. No physical exam done due to web/telehealth visit.  I discussed the assessment and treatment plan with the patient. The patient was provided with an opportunity to ask questions and all were answered. The patient agreed with the plan and demonstrated an understanding of the instructions.   The patient was advised to call back or see an in-person evaluation if the symptoms worsen or if the condition fails to improve as anticipated.   I provided 30 minutes of face-to-face video visit time during this encounter, and > 50% was spent counseling as documented under my assessment & plan.   Thereasa Solo, MD

## 2018-10-19 ENCOUNTER — Ambulatory Visit: Payer: 59 | Attending: Adult Health | Admitting: Physical Therapy

## 2018-10-19 DIAGNOSIS — G620 Drug-induced polyneuropathy: Secondary | ICD-10-CM | POA: Insufficient documentation

## 2018-10-19 DIAGNOSIS — M6281 Muscle weakness (generalized): Secondary | ICD-10-CM | POA: Insufficient documentation

## 2018-10-19 DIAGNOSIS — R262 Difficulty in walking, not elsewhere classified: Secondary | ICD-10-CM | POA: Insufficient documentation

## 2018-10-19 DIAGNOSIS — T451X5A Adverse effect of antineoplastic and immunosuppressive drugs, initial encounter: Secondary | ICD-10-CM | POA: Insufficient documentation

## 2018-10-19 DIAGNOSIS — M62838 Other muscle spasm: Secondary | ICD-10-CM | POA: Insufficient documentation

## 2018-10-20 ENCOUNTER — Encounter: Payer: Self-pay | Admitting: Adult Health

## 2018-10-24 ENCOUNTER — Ambulatory Visit: Payer: 59

## 2018-10-24 ENCOUNTER — Other Ambulatory Visit: Payer: Self-pay

## 2018-10-24 DIAGNOSIS — M6281 Muscle weakness (generalized): Secondary | ICD-10-CM | POA: Diagnosis present

## 2018-10-24 DIAGNOSIS — M62838 Other muscle spasm: Secondary | ICD-10-CM

## 2018-10-24 DIAGNOSIS — G620 Drug-induced polyneuropathy: Secondary | ICD-10-CM

## 2018-10-24 DIAGNOSIS — R262 Difficulty in walking, not elsewhere classified: Secondary | ICD-10-CM | POA: Diagnosis not present

## 2018-10-24 DIAGNOSIS — T451X5A Adverse effect of antineoplastic and immunosuppressive drugs, initial encounter: Secondary | ICD-10-CM

## 2018-10-24 NOTE — Therapy (Signed)
[place], [place], [address] Phone: [phone]   Fax:  [phone]  Physical Therapy Treatment  Patient Details  Name: Kathryn Ramsey MRN: [id number] Date of Birth: 05-Sep-1965 Referring Provider (PT): Causey   Encounter Date: 10/24/2018  PT End of Session - 10/24/18 1143    Visit Number  8    Number of Visits  11    Date for PT Re-Evaluation  10/31/18    PT Start Time  1104    PT Stop Time  1145    PT Time Calculation (min)  41 min    Activity Tolerance  Patient tolerated treatment well    Behavior During Therapy  [facility] for tasks assessed/performed       Past Medical History:  Diagnosis Date  . Acute sensory neuropathy 06/26/2015  . Allergy   . Anemia   . Asthma    illness induced asthma  . Blood transfusion without reported diagnosis    at age 53 years old D/T surgery to femur being crushed  . 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 Heart murmur    pt, states had a "working heart murmur"  . History of kidney stones   . Neutropenia, drug-induced ([place]) 03/10/2018  . PONV (postoperative nausea and vomiting)   . Skin cancer     Past Surgical History:  Procedure Laterality Date  . 3 laporscopic proceedures    . ABDOMINAL HYSTERECTOMY     2010  . CHOLECYSTECTOMY     2010  . DILATION AND CURETTAGE OF UTERUS     1997  . IR IMAGING GUIDED PORT INSERTION  03/08/2018  . LAPAROSCOPY N/A 03/27/2015   Procedure: DIAGNOSTIC LAPAROSCOPY ;  Surgeon: [name], MD;  Location: [facility];  Service: Gynecology;  Laterality: N/A;  . LAPAROSCOPY N/A 02/24/2016   Procedure: LAPAROSCOPY DIAGNOSTIC WITH PERITONEAL WASHINGS AND PERITONEAL BIOPSY;  Surgeon: [name], MD;  Location: [facility];  Service: Gynecology;  Laterality: N/A;  . LAPAROTOMY N/A 03/27/2015   Procedure: EXPLORATORY LAPAROTOMY, BILATERAL SALPINGO OOPHORECTOMY, OMENTECTOMY, RADICAL  TUMOR DEBULKING;  Surgeon: [name], MD;  Location: [facility];  Service: Gynecology;  Laterality: N/A;  . sinus surgery 1994      There were no vitals filed for this visit.  Subjective Assessment - 10/24/18 1109    Subjective  I had a full body massage earlier today before I came so that felt really good. I feel like it woke up my nerve endings some, so I don't need TENS today. I haven't used the one I have at home yet but will soon.    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  No/denies                       [facility] Adult PT Treatment/Exercise - 10/24/18 0001      Neuro Re-ed    Neuro Re-ed Details   In // bars: Heel-toe front and retro with eyes closed and fingertips skimming the bars, [place] Rt and Lt no UE support 2x each direction; on bosu ball dome down for squats x10 returning therapist demo, then static standing on bosu  with head turns 2 x 30 sec, very challenging for pt. Then walking along edge of black peds mat with Rt and Lt head turns 1x each way, then 1x looking up/down which was challenging for pt but less so.      Knee/Hip Exercises: Stretches   Passive Hamstring Stretch  Right;Left;2 reps;20 seconds   seated edge of chair   Hip Flexor Stretch  Right;Left;2 reps;20 seconds   standing grabbing heel behind glut, fingertips on wall   Gastroc Stretch  Both;3 reps;20 seconds   at elliptical with balls of feet on bar on floor, +1 HHA     Knee/Hip Exercises: Aerobic   Elliptical  QuickStart (on cancer rehab side) x6 mins on incline 5 (on Cancer Rehab side)      Knee/Hip Exercises: Machines for Strengthening   Total Gym Leg Press  60# 2x20, then toe raises on 60#, x20, then x15 (increased calf fatigue so second stopped  early then brief calf stretch after)    Hip Cybex  Bil hip 3 way raises, 25# x10 each and tactile and VCs for correct pelvic alignment and core engagment throuhgout for correct technique                  PT Long Term Goals - 09/19/18 1220      PT LONG TERM GOAL #1   Title  Pt will report a 50% improvement in pain and tingling in bilateral feet to allow improved comfort    Time  6    Period  Weeks    Status  New    Target Date  10/31/18      PT LONG TERM GOAL #2   Title  Pt will demonstrate 5/5 left hamstring strength to allow pt to return to PLOF    Baseline  4/5    Time  6    Period  Weeks    Status  New    Target Date  10/31/18      PT LONG TERM GOAL #3   Title  Pt will demonstrate 5/5 R dorisflexor strength to decrase fall risk    Baseline  4/5    Time  6    Period  Weeks    Status  New    Target Date  10/31/18      PT LONG TERM GOAL #4   Title  Pt will be independent in a home exercise program for continued strengthening and stretching    Time  6    Period  Weeks    Status  New    Target Date  10/31/18            Plan - 10/24/18 1144    Clinical Impression Statement  Continued with high level balance activities in standing and walking. Pt is progressing well though still challenged by activities. Also continued with bil hip/LE strengthening. She is able to limit hip and lumbar compensations now with min VCs for correcting technique with these exercises. Instructed pt to try TENS unit each night before bed as she reports CIPN pain woke her up a few nights last week. Encouraged her to try this a few nights in a row to see if she starts noticing a decrease in nighttime symptoms. Pt verbalized understanding and was excited to try this.    Personal Factors and Comorbidities  Time since onset of injury/illness/exacerbation;Comorbidity 1    Comorbidities  cancer with multiple recurrences    Examination-Participation Restrictions  Community Activity  Stability/Clinical Decision Making  Evolving/Moderate complexity    Rehab Potential  Good    PT Frequency  2x / week    PT Duration  6 weeks    PT Treatment/Interventions  ADLs/Self Care Home Management;Electrical Stimulation;Moist Heat;Cryotherapy;Therapeutic exercise;Balance training;Therapeutic activities;Patient/family education;Manual techniques    PT Next Visit Plan  See if pt tried TENS at home yet. Cont exercise for hips and lower core, higher level balance to include head turns and single leg, R d/f, elliptical    Consulted and Agree with Plan of Care  Patient       Patient will benefit from skilled therapeutic intervention in order to improve the following deficits and impairments:  Impaired sensation, Decreased strength, Pain  Visit Diagnosis: Muscle weakness (generalized)  Peripheral neuropathy due to chemotherapy (HCC)  Difficulty in walking, not elsewhere classified  Other muscle spasm     Problem List Patient Active Problem List   Diagnosis Date Noted  . Neuropathy due to chemotherapeutic drug ([employer]) 07/24/2018  . Anemia due to antineoplastic chemotherapy 05/22/2018  . Port-A-Cath in place 05/01/2018  . Neutropenia, drug-induced ([place]) 03/10/2018  . Diarrhea 08/02/2017  . Lichen sclerosus et atrophicus of the vulva 03/31/2016  . Abdominal wall mass of right lower quadrant 03/08/2016  . 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 Goals of care, counseling/discussion 02/27/2016  . Elevated CA-125 02/24/2016  . Vaginal dryness, menopausal 10/01/2015  . Drug-induced peripheral neuropathy ([place]) 09/02/2015  . Leukopenia due to antineoplastic chemotherapy ([place]) 09/02/2015  . Chemotherapy-induced neuropathy ([place]) 08/19/2015  . Cellulitis 07/03/2015  . Acute sensory neuropathy 06/26/2015  . High risk medication use 06/21/2015  . Facial paresthesia 06/18/2015  . Genetic testing 06/16/2015  . Restless legs 05/14/2015  . Family history of breast cancer in female  04/29/2015  . Chemotherapy-induced nausea 04/22/2015  . Chemotherapy induced neutropenia ([place]) 04/22/2015  . Cancer of right fallopian tube ([place]) 04/07/2015  . History of hysterectomy for benign disease 03/21/2015  . IBS (irritable bowel syndrome) 03/21/2015  . Dense breast tissue 03/21/2015  . History of cholecystectomy 03/21/2015  . Abdominal carcinomatosis ([place]) 03/21/2015    Otelia Limes, PTA 10/24/2018, 11:55 AM  [place], [place], [address] Phone: [phone]   Fax:  [phone]  Name: Kathryn Ramsey MRN: [id number] Date of Birth: [birth year]

## 2018-10-26 ENCOUNTER — Encounter: Payer: Self-pay | Admitting: Adult Health

## 2018-10-26 ENCOUNTER — Ambulatory Visit: Payer: 59

## 2018-10-26 ENCOUNTER — Telehealth: Payer: Self-pay

## 2018-10-26 ENCOUNTER — Other Ambulatory Visit: Payer: Self-pay | Admitting: Oncology

## 2018-10-26 ENCOUNTER — Encounter: Payer: Self-pay | Admitting: Oncology

## 2018-10-26 ENCOUNTER — Other Ambulatory Visit: Payer: Self-pay

## 2018-10-26 DIAGNOSIS — R262 Difficulty in walking, not elsewhere classified: Secondary | ICD-10-CM

## 2018-10-26 DIAGNOSIS — G620 Drug-induced polyneuropathy: Secondary | ICD-10-CM

## 2018-10-26 DIAGNOSIS — T451X5A Adverse effect of antineoplastic and immunosuppressive drugs, initial encounter: Secondary | ICD-10-CM

## 2018-10-26 DIAGNOSIS — M6281 Muscle weakness (generalized): Secondary | ICD-10-CM

## 2018-10-26 DIAGNOSIS — M62838 Other muscle spasm: Secondary | ICD-10-CM

## 2018-10-26 NOTE — Therapy (Signed)
[place], [place], [address] Phone: [phone]   Fax:  [phone]  Physical Therapy Treatment  Patient Details  Name: Kathryn Ramsey MRN: [id number] Date of Birth: June 15, 1965 Referring Provider (PT): Causey   Encounter Date: 10/26/2018  PT End of Session - 10/26/18 1101    Visit Number  9    Number of Visits  11    Date for PT Re-Evaluation  10/31/18    PT Start Time  1003    PT Stop Time  1053    PT Time Calculation (min)  50 min    Activity Tolerance  Patient tolerated treatment well    Behavior During Therapy  [facility] for tasks assessed/performed       Past Medical History:  Diagnosis Date  . Acute sensory neuropathy 06/26/2015  . Allergy   . Anemia   . Asthma    illness induced asthma  . Blood transfusion without reported diagnosis    at age 40 years old D/T surgery to femur being crushed  . 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 Heart murmur    pt, states had a "working heart murmur"  . History of kidney stones   . Neutropenia, drug-induced ([place]) 03/10/2018  . PONV (postoperative nausea and vomiting)   . Skin cancer     Past Surgical History:  Procedure Laterality Date  . 3 laporscopic proceedures    . ABDOMINAL HYSTERECTOMY     2010  . CHOLECYSTECTOMY     2010  . DILATION AND CURETTAGE OF UTERUS     1997  . IR IMAGING GUIDED PORT INSERTION  03/08/2018  . LAPAROSCOPY N/A 03/27/2015   Procedure: DIAGNOSTIC LAPAROSCOPY ;  Surgeon: [name], MD;  [facility];  Service: Gynecology;  Laterality: N/A;  . LAPAROSCOPY N/A 02/24/2016   Procedure: LAPAROSCOPY DIAGNOSTIC WITH PERITONEAL WASHINGS AND PERITONEAL BIOPSY;  Surgeon: [name], MD;  Location: [facility];  Service: Gynecology;  Laterality: N/A;  . LAPAROTOMY N/A 03/27/2015   Procedure: EXPLORATORY LAPAROTOMY, BILATERAL SALPINGO OOPHORECTOMY, OMENTECTOMY, RADICAL  TUMOR DEBULKING;  Surgeon: [name], MD;  Location: [facility];  Service: Gynecology;  Laterality: N/A;  . sinus surgery 1994      There were no vitals filed for this visit.  Subjective Assessment - 10/26/18 1017    Subjective  I can tell my bil LE strength is improving which is starting to help my balance. I made an appointment for acupuncture to see if it will help my CIPN symptoms. Just something else to try. I still haven't been able to get the CIPN cream that you guys suggested, I don't think my doctor has signed off on it or something.    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  No/denies                       [facility] Adult PT Treatment/Exercise - 10/26/18 0001      Neuro Re-ed    Neuro Re-ed Details   In // bars: Toe walking with eyes closed and fingertips skimming the bars, Heel-toe front and retro with eyes closed and fingertips skimming the  bars 2x for both, [place] Rt and Lt no UE support 2x each direction (except fingertips on bars); on bosu ball dome down for squats x15 returning therapist demo, then static standing on bosu with head turns 2 x 30 sec, very challenging for pt and then up/down 2x30 sec; also single leg heel raises on green oval x15 each. Then walking along edge of black peds mat with Rt and Lt head turns 2x each way, then 1x working on fluid pacing with head turns.       Knee/Hip Exercises: Aerobic   Elliptical  QuickStart (on cancer rehab side) x6 mins on incline 5 (on Cancer Rehab side)      Knee/Hip Exercises: Machines for Strengthening   Total Gym Leg Press  60# x20, 80# x20, then toe raises 60# x20    Hip Cybex  Bil hip 3 way raises, 25# x12 each and tactile and VCs for correct pelvic alignment and  core engagment throuhgout for correct technique                  PT Long Term Goals - 09/19/18 1220      PT LONG TERM GOAL #1   Title  Pt will report a 50% improvement in pain and tingling in bilateral feet to allow improved comfort    Time  6    Period  Weeks    Status  New    Target Date  10/31/18      PT LONG TERM GOAL #2   Title  Pt will demonstrate 5/5 left hamstring strength to allow pt to return to PLOF    Baseline  4/5    Time  6    Period  Weeks    Status  New    Target Date  10/31/18      PT LONG TERM GOAL #3   Title  Pt will demonstrate 5/5 R dorisflexor strength to decrase fall risk    Baseline  4/5    Time  6    Period  Weeks    Status  New    Target Date  10/31/18      PT LONG TERM GOAL #4   Title  Pt will be independent in a home exercise program for continued strengthening and stretching    Time  6    Period  Weeks    Status  New    Target Date  10/31/18            Plan - 10/26/18 1102    Clinical Impression Statement  Pt is progressing well though still very challenged by higher level balance activities, especially with head turns. She reports noting that her bil Le strength is improving which is helping her confidence, though she does still find herself tripping over her feet at times, more so when fatigue. And her husband stays near when they are walking in case she has a LOB. After next week she would like to continue PT to further progress her bil LE strength and confidence with balance and community ambulation.    Personal Factors and Comorbidities  Time since onset of injury/illness/exacerbation;Comorbidity 1    Comorbidities  cancer with multiple recurrences    Examination-Participation Restrictions  Community Activity    Stability/Clinical Decision Making  Evolving/Moderate complexity    Rehab Potential  Good    PT Frequency  2x / week    PT Duration  6 weeks    PT Treatment/Interventions  ADLs/Self Care Home  Management;Electrical  Stimulation;Moist Heat;Cryotherapy;Therapeutic exercise;Balance training;Therapeutic activities;Patient/family education;Manual techniques    PT Next Visit Plan  See if pt tried TENS at home yet. Try single leg squat with foot on chair behind pt in // bars (on balance oval too??) Cont exercise for hips and lower core, higher level balance to include head turns and single leg, R d/f, elliptical    Consulted and Agree with Plan of Care  Patient       Patient will benefit from skilled therapeutic intervention in order to improve the following deficits and impairments:  Impaired sensation, Decreased strength, Pain  Visit Diagnosis: Muscle weakness (generalized)  Peripheral neuropathy due to chemotherapy (HCC)  Difficulty in walking, not elsewhere classified  Other muscle spasm     Problem List Patient Active Problem List   Diagnosis Date Noted  . Neuropathy due to chemotherapeutic drug ([place]) 07/24/2018  . Anemia due to antineoplastic chemotherapy 05/22/2018  . Port-A-Cath in place 05/01/2018  . Neutropenia, drug-induced ([place]) 03/10/2018  . Diarrhea 08/02/2017  . Lichen sclerosus et atrophicus of the vulva 03/31/2016  . Abdominal wall mass of right lower quadrant 03/08/2016  . 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 Goals of care, counseling/discussion 02/27/2016  . Elevated CA-125 02/24/2016  . Vaginal dryness, menopausal 10/01/2015  . Drug-induced peripheral neuropathy ([place]) 09/02/2015  . Leukopenia due to antineoplastic chemotherapy ([place]) 09/02/2015  . Chemotherapy-induced neuropathy ([place]) 08/19/2015  . Cellulitis 07/03/2015  . Acute sensory neuropathy 06/26/2015  . High risk medication use 06/21/2015  . Facial paresthesia 06/18/2015  . Genetic testing 06/16/2015  . Restless legs 05/14/2015  . Family history of breast cancer in female 04/29/2015  . Chemotherapy-induced nausea 04/22/2015  . Chemotherapy induced neutropenia ([place]) 04/22/2015  .  Cancer of right fallopian tube ([place]) 04/07/2015  . History of hysterectomy for benign disease 03/21/2015  . IBS (irritable bowel syndrome) 03/21/2015  . Dense breast tissue 03/21/2015  . History of cholecystectomy 03/21/2015  . 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

## 2018-10-26 NOTE — Telephone Encounter (Signed)
Called in Rx for peripheral neuropathy cream to [facility] [phone].

## 2018-10-30 ENCOUNTER — Ambulatory Visit: Payer: 59

## 2018-10-30 ENCOUNTER — Other Ambulatory Visit: Payer: Self-pay

## 2018-10-30 DIAGNOSIS — M62838 Other muscle spasm: Secondary | ICD-10-CM

## 2018-10-30 DIAGNOSIS — R262 Difficulty in walking, not elsewhere classified: Secondary | ICD-10-CM

## 2018-10-30 DIAGNOSIS — M6281 Muscle weakness (generalized): Secondary | ICD-10-CM

## 2018-10-30 DIAGNOSIS — G620 Drug-induced polyneuropathy: Secondary | ICD-10-CM

## 2018-10-30 NOTE — Therapy (Signed)
[place], [place], [address] Phone: [phone]   Fax:  [phone]  Physical Therapy Treatment  Patient Details  Name: Kathryn Ramsey MRN: [id number] Date of Birth: 1965/07/13 Referring Provider (PT): Causey   Encounter Date: 10/30/2018  PT End of Session - 10/30/18 1124    Visit Number  10    Number of Visits  11    Date for PT Re-Evaluation  10/31/18    PT Start Time  1101    PT Stop Time  1148    PT Time Calculation (min)  47 min    Activity Tolerance  Patient tolerated treatment well    Behavior During Therapy  [facility] for tasks assessed/performed       Past Medical History:  Diagnosis Date  . Acute sensory neuropathy 06/26/2015  . Allergy   . Anemia   . Asthma    illness induced asthma  . Blood transfusion without reported diagnosis    at age 51 years old D/T surgery to femur being crushed  . 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 Heart murmur    pt, states had a "working heart murmur"  . History of kidney stones   . Neutropenia, drug-induced ([place]) 03/10/2018  . PONV (postoperative nausea and vomiting)   . Skin cancer     Past Surgical History:  Procedure Laterality Date  . 3 laporscopic proceedures    . ABDOMINAL HYSTERECTOMY     2010  . CHOLECYSTECTOMY     2010  . DILATION AND CURETTAGE OF UTERUS     1997  . IR IMAGING GUIDED PORT INSERTION  03/08/2018  . LAPAROSCOPY N/A 03/27/2015   Procedure: DIAGNOSTIC LAPAROSCOPY ;  Surgeon: [name], MD;  [facility];  Service: Gynecology;  Laterality: N/A;  . LAPAROSCOPY N/A 02/24/2016   Procedure: LAPAROSCOPY DIAGNOSTIC WITH PERITONEAL WASHINGS AND PERITONEAL BIOPSY;  Surgeon: [name], MD;  [facility];  Service: Gynecology;  Laterality: N/A;  . LAPAROTOMY N/A 03/27/2015   Procedure: EXPLORATORY LAPAROTOMY, BILATERAL SALPINGO OOPHORECTOMY, OMENTECTOMY,  RADICAL TUMOR DEBULKING;  Surgeon: [name], MD;  [facility];  Service: Gynecology;  Laterality: N/A;  . sinus surgery 1994      There were no vitals filed for this visit.  Subjective Assessment - 10/30/18 1107    Subjective  [place] We went for a walk on Saturday and were just busy doing stuff so I'm more tired than usual today.    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  No/denies                       [facility] Adult PT Treatment/Exercise - 10/30/18 0001      Neuro Re-ed    Neuro Re-ed Details   In // bars: Toe walking with eyes closed and fingertips skimming the bars, Heel-toe front and retro with eyes closed and fingertips skimming the bars 2x for both; on bosu ball dome down for squats 2x10 (pt reports feeling stronger with these today), then static standing on bosu with narrow BOS for head turns 2 x 30  sec, very challenging for pt and then looking up/down 2x30 sec with VCs to work on equal weight displacement into feet (was on balls of feet more); also single leg heel raises on blue oval x15 each (with standing gastroc stretch 1x after each). Then walking along edge of black peds mat with Rt and Lt head turns 2x each way working on fluid pacing with head turns.       Knee/Hip Exercises: Stretches   Passive Hamstring Stretch  Right;Left;2 reps;20 seconds   seated edge of chair   Piriformis Stretch  Right;Left;2 reps;20 seconds      Knee/Hip Exercises: Aerobic   Elliptical  QuickStart (on ortho side) x5 mins      Knee/Hip Exercises: Machines for Strengthening   Total Gym Leg Press  80# 2 x 20, 60# 2x15 with rest breaks bvetween each set     Hip Cybex  Bil hip 3 way raises with  setting on 4 on each side, 25# x12 each and tactile and VCs for correct pelvic alignment and core engagment throuhgout for correct technique      Knee/Hip Exercises: Standing   Other Standing Knee Exercises  single leg squat with other leg behind pt on chair and hands on hips x10 each, very challenging for pt                  PT Long Term Goals - 09/19/18 1220      PT LONG TERM GOAL #1   Title  Pt will report a 50% improvement in pain and tingling in bilateral feet to allow improved comfort    Time  6    Period  Weeks    Status  New    Target Date  10/31/18      PT LONG TERM GOAL #2   Title  Pt will demonstrate 5/5 left hamstring strength to allow pt to return to PLOF    Baseline  4/5    Time  6    Period  Weeks    Status  New    Target Date  10/31/18      PT LONG TERM GOAL #3   Title  Pt will demonstrate 5/5 R dorisflexor strength to decrase fall risk    Baseline  4/5    Time  6    Period  Weeks    Status  New    Target Date  10/31/18      PT LONG TERM GOAL #4   Title  Pt will be independent in a home exercise program for continued strengthening and stretching    Time  6    Period  Weeks    Status  New    Target Date  10/31/18            Plan - 10/30/18 1137    Clinical Impression Statement  Pt continues to be challenged by exercises and balance activities done at each session. She reports noting some improvements with her endurance and strength but will still have times, like at night, with much increased CIPN pain that wakes her up at night. Pt would like to continue coming to therapy and will speak to PT at next session about this.    Personal Factors and Comorbidities  Time since onset of injury/illness/exacerbation;Comorbidity 1    Comorbidities  cancer with multiple recurrences    Examination-Participation Restrictions  Community Activity    Stability/Clinical Decision Making  Evolving/Moderate complexity    Rehab Potential  Good  PT Frequency   2x / week    PT Duration  6 weeks    PT Treatment/Interventions  ADLs/Self Care Home Management;Electrical Stimulation;Moist Heat;Cryotherapy;Therapeutic exercise;Balance training;Therapeutic activities;Patient/family education;Manual techniques    PT Next Visit Plan  Renewal next session. See if pt tried TENS at home yet. Cont exercise for hips and lower core, higher level balance to include head turns and single leg, R d/f, elliptical    Consulted and Agree with Plan of Care  Patient       Patient will benefit from skilled therapeutic intervention in order to improve the following deficits and impairments:  Impaired sensation, Decreased strength, Pain  Visit Diagnosis: Muscle weakness (generalized)  Peripheral neuropathy due to chemotherapy (HCC)  Difficulty in walking, not elsewhere classified  Other muscle spasm     Problem List Patient Active Problem List   Diagnosis Date Noted  . Neuropathy due to chemotherapeutic drug ([place]) 07/24/2018  . Anemia due to antineoplastic chemotherapy 05/22/2018  . Port-A-Cath in place 05/01/2018  . Neutropenia, drug-induced ([place]) 03/10/2018  . Diarrhea 08/02/2017  . Lichen sclerosus et atrophicus of the vulva 03/31/2016  . Abdominal wall mass of right lower quadrant 03/08/2016  . 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 Goals of care, counseling/discussion 02/27/2016  . Elevated CA-125 02/24/2016  . Vaginal dryness, menopausal 10/01/2015  . Drug-induced peripheral neuropathy ([place]) 09/02/2015  . Leukopenia due to antineoplastic chemotherapy ([place]) 09/02/2015  . Chemotherapy-induced neuropathy ([place]) 08/19/2015  . Cellulitis 07/03/2015  . Acute sensory neuropathy 06/26/2015  . High risk medication use 06/21/2015  . Facial paresthesia 06/18/2015  . Genetic testing 06/16/2015  . Restless legs 05/14/2015  . Family history of breast cancer in female 04/29/2015  . Chemotherapy-induced nausea 04/22/2015  . Chemotherapy induced neutropenia  ([place]) 04/22/2015  . Cancer of right fallopian tube ([place]) 04/07/2015  . History of hysterectomy for benign disease 03/21/2015  . IBS (irritable bowel syndrome) 03/21/2015  . Dense breast tissue 03/21/2015  . History of cholecystectomy 03/21/2015  . Abdominal carcinomatosis ([place]) 03/21/2015    Otelia Limes, PTA 10/30/2018, 11:52 AM  [place], [place], [address] Phone: [phone]   Fax:  [phone]  Name: Kathryn Ramsey MRN: [id number] Date of Birth: Sep 20, 1965

## 2018-10-31 ENCOUNTER — Encounter: Payer: 59 | Admitting: Physical Therapy

## 2018-11-02 ENCOUNTER — Other Ambulatory Visit: Payer: Self-pay

## 2018-11-02 ENCOUNTER — Ambulatory Visit: Payer: 59 | Admitting: Physical Therapy

## 2018-11-02 ENCOUNTER — Encounter: Payer: Self-pay | Admitting: Physical Therapy

## 2018-11-02 DIAGNOSIS — R262 Difficulty in walking, not elsewhere classified: Secondary | ICD-10-CM | POA: Diagnosis not present

## 2018-11-02 DIAGNOSIS — M62838 Other muscle spasm: Secondary | ICD-10-CM

## 2018-11-02 DIAGNOSIS — G620 Drug-induced polyneuropathy: Secondary | ICD-10-CM

## 2018-11-02 DIAGNOSIS — M6281 Muscle weakness (generalized): Secondary | ICD-10-CM

## 2018-11-02 NOTE — Therapy (Signed)
[place], [place], [address] Phone: [phone]   Fax:  [phone]  Physical Therapy Treatment  Patient Details  Name: Kathryn Ramsey MRN: [id number] Date of Birth: 1965/04/22 Referring Provider (PT): Causey   Encounter Date: 11/02/2018  PT End of Session - 11/02/18 1039    Visit Number  7    Number of Visits  18    Date for PT Re-Evaluation  12/14/18    PT Start Time  1031    PT Stop Time  1115    PT Time Calculation (min)  44 min    Activity Tolerance  Patient tolerated treatment well    Behavior During Therapy  [facility] for tasks assessed/performed       Past Medical History:  Diagnosis Date  . Acute sensory neuropathy 06/26/2015  . Allergy   . Anemia   . Asthma    illness induced asthma  . Blood transfusion without reported diagnosis    at age 53 years old D/T surgery to femur being crushed  . 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 Heart murmur    pt, states had a "working heart murmur"  . History of kidney stones   . Neutropenia, drug-induced ([place]) 03/10/2018  . PONV (postoperative nausea and vomiting)   . Skin cancer     Past Surgical History:  Procedure Laterality Date  . 3 laporscopic proceedures    . ABDOMINAL HYSTERECTOMY     2010  . CHOLECYSTECTOMY     2010  . DILATION AND CURETTAGE OF UTERUS     1997  . IR IMAGING GUIDED PORT INSERTION  03/08/2018  . LAPAROSCOPY N/A 03/27/2015   Procedure: DIAGNOSTIC LAPAROSCOPY ;  Surgeon: [name], MD;  [facility];  Service: Gynecology;  Laterality: N/A;  . LAPAROSCOPY N/A 02/24/2016   Procedure: LAPAROSCOPY DIAGNOSTIC WITH PERITONEAL WASHINGS AND PERITONEAL BIOPSY;  Surgeon: [name], MD;  [facility];  Service: Gynecology;  Laterality: N/A;  . LAPAROTOMY N/A 03/27/2015   Procedure: EXPLORATORY LAPAROTOMY, BILATERAL SALPINGO OOPHORECTOMY, OMENTECTOMY, RADICAL  TUMOR DEBULKING;  Surgeon: [name], MD;  [facility];  Service: Gynecology;  Laterality: N/A;  . sinus surgery 1994      There were no vitals filed for this visit.  Subjective Assessment - 11/02/18 1032    Subjective  PT has really improved my confidence. I have actually gone back to yoga which is outside which is safe. The numbness in my feet and legs is getting better. I just recently found a foot massager and I may use that. I have been using the TENS.    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  No/denies    Pain Score  0-No pain                       [facility] Adult PT Treatment/Exercise - 11/02/18 0001      Neuro Re-ed    Neuro Re-ed Details   on bosu ball dome down for squats 2x10 (pt report with increased difficulty with these today), then static standing on bosu with narrow BOS for head turns 2 x 30 sec,  very challenging for pt with v/c for equal weight displacement into feet (was on balls of feet more); also single leg heel raises on blue foam x15 each.       Knee/Hip Exercises: Stretches   Passive Hamstring Stretch  Right;Left;2 reps;20 seconds   seated edge of chair   Piriformis Stretch  Right;Left;2 reps;20 seconds      Knee/Hip Exercises: Aerobic   Elliptical  QuickStart (on cancer rehab side) x6 mins      Knee/Hip Exercises: Machines for Strengthening   Total Gym Leg Press  80# x 3 stoped due to fatigue, 60# 1x20 then heel raises 60# x 20    Hip Cybex  Bil hip 3 way raises with setting on 4 on each side, 25# x12 each and tactile and VCs for correct pelvic alignment and core engagment throuhgout for correct technique      Knee/Hip Exercises: Standing   Other Standing Knee Exercises  single leg squat with  other leg behind pt on chair and hands on hips x10 each, very challenging for pt                  PT Long Term Goals - 11/02/18 1037      PT LONG TERM GOAL #1   Title  Pt will report a 50% improvement in pain and tingling in bilateral feet to allow improved comfort    Baseline  still learning, 11/02/18- 25% improvement - comes and goes    Time  6    Period  Weeks    Status  On-going      PT LONG TERM GOAL #2   Title  Pt will demonstrate 5/5 left hamstring strength to allow pt to return to PLOF    Baseline  4/5, 11/02/18- 4+/5    Time  6    Period  Weeks    Status  On-going      PT LONG TERM GOAL #3   Title  Pt will demonstrate 5/5 R dorisflexor strength to decrase fall risk    Baseline  4/5, 11/02/18- 4+/5    Time  6    Period  Weeks    Status  On-going      PT LONG TERM GOAL #4   Title  Pt will be independent in a home exercise program for continued strengthening and stretching    Time  6    Period  Weeks    Status  On-going            Plan - 11/02/18 1126    Clinical Impression Statement  Assessed pt's progress towards goals in therapy. Pt is progressing towards all goals but has not met the goals yet. She is feeling much stronger and her CIPN is improving. She continues to have difficulty with higher level balance activities especially dynamic balance with head turns. Pt would benefit from continued skilled PT services to continue to improve balance and strength and progress pt towards meeting goals.    Rehab Potential  Good    PT Frequency  --   2x/wk for 1 week then decreasing to 1x/wk for 5 weeks   PT Duration  6 weeks    PT Treatment/Interventions  ADLs/Self Care Home Management;Electrical Stimulation;Moist Heat;Cryotherapy;Therapeutic exercise;Balance training;Therapeutic activities;Patient/family education;Manual techniques    PT Next Visit Plan  Cont exercise for hips and lower core, higher level balance to include head turns and single leg, R d/f,  elliptical    Consulted and Agree with Plan of Care  Patient       Patient will benefit from skilled therapeutic intervention in order to improve the following deficits and impairments:  Impaired sensation, Decreased strength, Pain  Visit Diagnosis: Muscle weakness (generalized)  Difficulty in walking, not elsewhere classified  Peripheral neuropathy due to chemotherapy [facility])  Other muscle spasm     Problem List Patient Active Problem List   Diagnosis Date Noted  . Neuropathy due to chemotherapeutic drug ([place]) 07/24/2018  . Anemia due to antineoplastic chemotherapy 05/22/2018  . Port-A-Cath in place 05/01/2018  . Neutropenia, drug-induced ([place]) 03/10/2018  . Diarrhea 08/02/2017  . Lichen sclerosus et atrophicus of the vulva 03/31/2016  . Abdominal wall mass of right lower quadrant 03/08/2016  . 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 Goals of care, counseling/discussion 02/27/2016  . Elevated CA-125 02/24/2016  . Vaginal dryness, menopausal 10/01/2015  . Drug-induced peripheral neuropathy ([place]) 09/02/2015  . Leukopenia due to antineoplastic chemotherapy ([place]) 09/02/2015  . Chemotherapy-induced neuropathy ([place]) 08/19/2015  . Cellulitis 07/03/2015  . Acute sensory neuropathy 06/26/2015  . High risk medication use 06/21/2015  . Facial paresthesia 06/18/2015  . Genetic testing 06/16/2015  . Restless legs 05/14/2015  . Family history of breast cancer in female 04/29/2015  . Chemotherapy-induced nausea 04/22/2015  . Chemotherapy induced neutropenia ([place]) 04/22/2015  . Cancer of right fallopian tube ([place]) 04/07/2015  . History of hysterectomy for benign disease 03/21/2015  . IBS (irritable bowel syndrome) 03/21/2015  . Dense breast tissue 03/21/2015  . History of cholecystectomy 03/21/2015  . Abdominal carcinomatosis ([place]) 03/21/2015    Allyson [facility] 11/02/2018, 11:29 AM  [place], [place], [address] Phone: [phone]   Fax:  [phone]  Name: Kathryn Ramsey MRN: [id number] Date of Birth: [birth year]  Manus Gunning, PT 11/02/18 11:29 AM

## 2018-11-07 ENCOUNTER — Ambulatory Visit: Payer: 59 | Admitting: Physical Therapy

## 2018-11-07 ENCOUNTER — Encounter: Payer: Self-pay | Admitting: Physical Therapy

## 2018-11-07 ENCOUNTER — Other Ambulatory Visit: Payer: Self-pay

## 2018-11-07 DIAGNOSIS — R262 Difficulty in walking, not elsewhere classified: Secondary | ICD-10-CM | POA: Diagnosis not present

## 2018-11-07 DIAGNOSIS — G620 Drug-induced polyneuropathy: Secondary | ICD-10-CM

## 2018-11-07 DIAGNOSIS — M6281 Muscle weakness (generalized): Secondary | ICD-10-CM

## 2018-11-07 NOTE — Therapy (Signed)
[place], [place], [address] Phone: [phone]   Fax:  [phone]  Physical Therapy Treatment  Patient Details  Name: Kathryn Ramsey MRN: [id number] Date of Birth: Mar 10, 1965 Referring Provider (PT): Causey   Encounter Date: 11/07/2018  PT End of Session - 11/07/18 1218    Visit Number  8    Number of Visits  18    Date for PT Re-Evaluation  12/14/18    PT Start Time  [id number]    PT Stop Time  1216    PT Time Calculation (min)  45 min    Activity Tolerance  Patient tolerated treatment well    Behavior During Therapy  [facility] for tasks assessed/performed       Past Medical History:  Diagnosis Date  . Acute sensory neuropathy 06/26/2015  . Allergy   . Anemia   . Asthma    illness induced asthma  . Blood transfusion without reported diagnosis    at age 53 years old D/T surgery to femur being crushed  . Complication of anesthesia    hx. of allergic to ether (had surgery at 53 years and had reaction to the ether  . Heart murmur    pt, states had a "working heart murmur"  . History of kidney stones   . Neutropenia, drug-induced ([place]) 03/10/2018  . PONV (postoperative nausea and vomiting)   . Skin cancer     Past Surgical History:  Procedure Laterality Date  . 3 laporscopic proceedures    . ABDOMINAL HYSTERECTOMY     2010  . CHOLECYSTECTOMY     2010  . DILATION AND CURETTAGE OF UTERUS     1997  . IR IMAGING GUIDED PORT INSERTION  03/08/2018  . LAPAROSCOPY N/A 03/27/2015   Procedure: DIAGNOSTIC LAPAROSCOPY ;  Surgeon: [name], MD;  [facility];  Service: Gynecology;  Laterality: N/A;  . LAPAROSCOPY N/A 02/24/2016   Procedure: LAPAROSCOPY DIAGNOSTIC WITH PERITONEAL WASHINGS AND PERITONEAL BIOPSY;  Surgeon: [name], MD;  [facility];  Service: Gynecology;  Laterality: N/A;  . LAPAROTOMY N/A 03/27/2015   Procedure: EXPLORATORY LAPAROTOMY, BILATERAL SALPINGO OOPHORECTOMY, OMENTECTOMY, RADICAL  TUMOR DEBULKING;  Surgeon: [name], MD;  [facility];  Service: Gynecology;  Laterality: N/A;  . sinus surgery 1994      There were no vitals filed for this visit.  Subjective Assessment - 11/07/18 1134    Subjective  I did have some soreness in my rib cage which is new and I am not sure if it is from the cancer or the exercises.    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  No/denies    Pain Score  0-No pain                       [facility] Adult PT Treatment/Exercise - 11/07/18 0001      Neuro Re-ed    Neuro Re-ed Details   In // bars: Toe walking with eyes closed and fingertips skimming the bars, Heel-toe front and retro with eyes closed and fingertips skimming the bars 2x for both; on bosu ball dome down for squats 2x10 (pt required less v/c for proper form), then static standing on bosu with narrow BOS for  head turns 2 x 30 sec, very challenging for pt and then looking up/down with normal BOS 2x30 sec also very challenging ; also single leg heel raises on blue foam x15 each (with standing gastroc stretch 1x after each). Then walking along edge of black peds mat with Rt and Lt head turns 2x each way working on fluid pacing with head turns.       Knee/Hip Exercises: Stretches   Passive Hamstring Stretch  Right;Left;2 reps;20 seconds   seated edge of chair   Piriformis Stretch  Right;Left;2 reps;20 seconds      Knee/Hip Exercises: Aerobic   Elliptical  QuickStart (on cancer rehab side) x5 mins      Knee/Hip Exercises: Machines for Strengthening   Total Gym Leg Press  80# x 2 sets of 20 then heel raises 80# x 20    Hip Cybex  Bil hip 3 way raises with setting on 4 on each side, 25# x15 each and tactile and VCs for correct  pelvic alignment and core engagment throuhgout for correct technique                  PT Long Term Goals - 11/02/18 1037      PT LONG TERM GOAL #1   Title  Pt will report a 50% improvement in pain and tingling in bilateral feet to allow improved comfort    Baseline  still learning, 11/02/18- 25% improvement - comes and goes    Time  6    Period  Weeks    Status  On-going      PT LONG TERM GOAL #2   Title  Pt will demonstrate 5/5 left hamstring strength to allow pt to return to PLOF    Baseline  4/5, 11/02/18- 4+/5    Time  6    Period  Weeks    Status  On-going      PT LONG TERM GOAL #3   Title  Pt will demonstrate 5/5 R dorisflexor strength to decrase fall risk    Baseline  4/5, 11/02/18- 4+/5    Time  6    Period  Weeks    Status  On-going      PT LONG TERM GOAL #4   Title  Pt will be independent in a home exercise program for continued strengthening and stretching    Time  6    Period  Weeks    Status  On-going            Plan - 11/07/18 1219    Clinical Impression Statement  Pt is still very challenged with balance exercises on Bosu ball. She looses balance often and requires wall for steady herself. She was able walk along slanted edge of peds mat with head turns with much improvement today and no losses of balance exhibiting fluid movement which is a big improvement. She was able to tolerate increase resistance for leg press and heel lifts today.    PT Frequency  --   2x/wk for 1 week then decreasing to 1x/wk for 5 weeks   PT Duration  6 weeks    PT Treatment/Interventions  ADLs/Self Care Home Management;Electrical Stimulation;Moist Heat;Cryotherapy;Therapeutic exercise;Balance training;Therapeutic activities;Patient/family education;Manual techniques    PT Next Visit Plan  Cont exercise for hips and lower core, higher level balance to include head turns and single leg, R d/f, elliptical    PT Home Exercise Plan  Sent information to Wilber Bihari about  getting a neuropathy cream ( see Marcene Brawn  for more details)    Consulted and Agree with Plan of Care  Patient       Patient will benefit from skilled therapeutic intervention in order to improve the following deficits and impairments:     Visit Diagnosis: Difficulty in walking, not elsewhere classified  Muscle weakness (generalized)  Peripheral neuropathy due to chemotherapy [facility])     Problem List Patient Active Problem List   Diagnosis Date Noted  . Neuropathy due to chemotherapeutic drug ([place]) 07/24/2018  . Anemia due to antineoplastic chemotherapy 05/22/2018  . Port-A-Cath in place 05/01/2018  . Neutropenia, drug-induced ([place]) 03/10/2018  . Diarrhea 08/02/2017  . Lichen sclerosus et atrophicus of the vulva 03/31/2016  . Abdominal wall mass of right lower quadrant 03/08/2016  . Carcinomatosis peritonei ([employer]) 02/28/2016  . Goals of care, counseling/discussion 02/27/2016  . Elevated CA-125 02/24/2016  . Vaginal dryness, menopausal 10/01/2015  . Drug-induced peripheral neuropathy ([place]) 09/02/2015  . Leukopenia due to antineoplastic chemotherapy ([place]) 09/02/2015  . Chemotherapy-induced neuropathy ([place]) 08/19/2015  . Cellulitis 07/03/2015  . Acute sensory neuropathy 06/26/2015  . High risk medication use 06/21/2015  . Facial paresthesia 06/18/2015  . Genetic testing 06/16/2015  . Restless legs 05/14/2015  . Family history of breast cancer in female 04/29/2015  . Chemotherapy-induced nausea 04/22/2015  . Chemotherapy induced neutropenia ([place]) 04/22/2015  . Cancer of right fallopian tube ([place]) 04/07/2015  . History of hysterectomy for benign disease 03/21/2015  . IBS (irritable bowel syndrome) 03/21/2015  . Dense breast tissue 03/21/2015  . History of cholecystectomy 03/21/2015  . 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

## 2018-11-09 ENCOUNTER — Other Ambulatory Visit: Payer: Self-pay

## 2018-11-09 ENCOUNTER — Ambulatory Visit: Payer: 59 | Admitting: Physical Therapy

## 2018-11-09 ENCOUNTER — Encounter: Payer: Self-pay | Admitting: Physical Therapy

## 2018-11-09 DIAGNOSIS — R262 Difficulty in walking, not elsewhere classified: Secondary | ICD-10-CM | POA: Diagnosis not present

## 2018-11-09 DIAGNOSIS — M6281 Muscle weakness (generalized): Secondary | ICD-10-CM

## 2018-11-09 DIAGNOSIS — T451X5A Adverse effect of antineoplastic and immunosuppressive drugs, initial encounter: Secondary | ICD-10-CM

## 2018-11-09 DIAGNOSIS — G620 Drug-induced polyneuropathy: Secondary | ICD-10-CM

## 2018-11-09 NOTE — Therapy (Signed)
[place], [place], [address] Phone: [phone]   Fax:  [phone]  Physical Therapy Treatment  Patient Details  Name: Kathryn Ramsey MRN: [id number] Date of Birth: 1965-03-22 Referring Provider (PT): Causey   Encounter Date: 11/09/2018  PT End of Session - 11/09/18 1226    Visit Number  9    Number of Visits  18    Date for PT Re-Evaluation  12/14/18    PT Start Time  1132    PT Stop Time  1215    PT Time Calculation (min)  43 min    Activity Tolerance  Patient tolerated treatment well    Behavior During Therapy  [facility] for tasks assessed/performed       Past Medical History:  Diagnosis Date  . Acute sensory neuropathy 06/26/2015  . Allergy   . Anemia   . Asthma    illness induced asthma  . Blood transfusion without reported diagnosis    at age 53 years old D/T surgery to femur being crushed  . Complication of anesthesia    hx. of allergic to ether (had surgery at 53 years and had reaction to the ether  . Heart murmur    pt, states had a "working heart murmur"  . History of kidney stones   . Neutropenia, drug-induced ([place]) 03/10/2018  . PONV (postoperative nausea and vomiting)   . Skin cancer     Past Surgical History:  Procedure Laterality Date  . 3 laporscopic proceedures    . ABDOMINAL HYSTERECTOMY     2010  . CHOLECYSTECTOMY     2010  . DILATION AND CURETTAGE OF UTERUS     1997  . IR IMAGING GUIDED PORT INSERTION  03/08/2018  . LAPAROSCOPY N/A 03/27/2015   Procedure: DIAGNOSTIC LAPAROSCOPY ;  Surgeon: [name], MD;  Location: [facility];  Service: Gynecology;  Laterality: N/A;  . LAPAROSCOPY N/A 02/24/2016   Procedure: LAPAROSCOPY DIAGNOSTIC WITH PERITONEAL WASHINGS AND PERITONEAL BIOPSY;  Surgeon: [name], MD;  Location: [facility];  Service: Gynecology;  Laterality: N/A;  . LAPAROTOMY N/A 03/27/2015   Procedure: EXPLORATORY LAPAROTOMY, BILATERAL SALPINGO OOPHORECTOMY, OMENTECTOMY, RADICAL  TUMOR DEBULKING;  Surgeon: [name], MD;  Location: [facility];  Service: Gynecology;  Laterality: N/A;  . sinus surgery 1994      There were no vitals filed for this visit.  Subjective Assessment - 11/09/18 1134    Subjective  I am still having some belly pain and I think it is the cancer.    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  Yes    Pain Score  3     Pain Location  Abdomen    Pain Descriptors / Indicators  Discomfort    Pain Type  Acute pain    Pain Onset  In the past 7 days                       [facility] Adult PT Treatment/Exercise - 11/09/18 0001      Neuro Re-ed    Neuro Re-ed Details   In // bars: Toe walking with eyes closed and fingertips skimming the bars, Heel-toe front and retro with eyes closed and fingertips skimming the bars 2x for both; on  bosu ball dome down for squats 2x15 (pt demonstrated increased stability today), then static standing on bosu with narrow BOS for head turns 2 x 30 sec, very challenging for pt and then looking up/down with normal BOS 2x30 sec also very challenging ; also single leg heel raises on blue foam x15 each (with standing gastroc stretch 1x after each). Then walking along edge of black peds mat with Rt and Lt head turns 2x each way working on fluid pacing with head turns.       Knee/Hip Exercises: Stretches   Passive Hamstring Stretch  Right;Left;2 reps;20 seconds   seated edge of chair   Piriformis Stretch  Right;Left;2 reps;20 seconds      Knee/Hip Exercises: Aerobic   Elliptical  QuickStart (on cancer rehab side) x5 min 30 sec      Knee/Hip Exercises: Machines for Strengthening   Total Gym Leg Press  80# x 2 sets of 20 then heel raises 80# x 20    Hip Cybex  Bil hip 3 way  raises with setting on 4 on each side, 25# x15 each and tactile and VCs for correct pelvic alignment and core engagment throuhgout for correct technique                  PT Long Term Goals - 11/02/18 1037      PT LONG TERM GOAL #1   Title  Pt will report a 50% improvement in pain and tingling in bilateral feet to allow improved comfort    Baseline  still learning, 11/02/18- 25% improvement - comes and goes    Time  6    Period  Weeks    Status  On-going      PT LONG TERM GOAL #2   Title  Pt will demonstrate 5/5 left hamstring strength to allow pt to return to PLOF    Baseline  4/5, 11/02/18- 4+/5    Time  6    Period  Weeks    Status  On-going      PT LONG TERM GOAL #3   Title  Pt will demonstrate 5/5 R dorisflexor strength to decrase fall risk    Baseline  4/5, 11/02/18- 4+/5    Time  6    Period  Weeks    Status  On-going      PT LONG TERM GOAL #4   Title  Pt will be independent in a home exercise program for continued strengthening and stretching    Time  6    Period  Weeks    Status  On-going            Plan - 11/09/18 1227    Clinical Impression Statement  Pt will miss next session due to participating in a clinical trial at [facility]. She demonstrated much more control today when doing squats on Bosu and did not loose her balance like she has previously. She is still very challenged by head turns when walking and on Bosu.    PT Frequency  1x / week    PT Duration  6 weeks    PT Treatment/Interventions  ADLs/Self Care Home Management;Electrical Stimulation;Moist Heat;Cryotherapy;Therapeutic exercise;Balance training;Therapeutic activities;Patient/family education;Manual techniques    PT Next Visit Plan  Cont exercise for hips and lower core, higher level balance to include head turns and single leg, R d/f, elliptical    Consulted and Agree with Plan of Care  Patient       Patient will benefit from skilled therapeutic intervention in  order to improve the  following deficits and impairments:     Visit Diagnosis: Difficulty in walking, not elsewhere classified  Peripheral neuropathy due to chemotherapy [facility])  Muscle weakness (generalized)     Problem List Patient Active Problem List   Diagnosis Date Noted  . Neuropathy due to chemotherapeutic drug ([place]) 07/24/2018  . Anemia due to antineoplastic chemotherapy 05/22/2018  . Port-A-Cath in place 05/01/2018  . Neutropenia, drug-induced ([place]) 03/10/2018  . Diarrhea 08/02/2017  . Lichen sclerosus et atrophicus of the vulva 03/31/2016  . Abdominal wall mass of right lower quadrant 03/08/2016  . 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 Goals of care, counseling/discussion 02/27/2016  . Elevated CA-125 02/24/2016  . Vaginal dryness, menopausal 10/01/2015  . Drug-induced peripheral neuropathy ([place]) 09/02/2015  . Leukopenia due to antineoplastic chemotherapy ([place]) 09/02/2015  . Chemotherapy-induced neuropathy ([place]) 08/19/2015  . Cellulitis 07/03/2015  . Acute sensory neuropathy 06/26/2015  . High risk medication use 06/21/2015  . Facial paresthesia 06/18/2015  . Genetic testing 06/16/2015  . Restless legs 05/14/2015  . Family history of breast cancer in female 04/29/2015  . Chemotherapy-induced nausea 04/22/2015  . Chemotherapy induced neutropenia ([place]) 04/22/2015  . Cancer of right fallopian tube ([place]) 04/07/2015  . History of hysterectomy for benign disease 03/21/2015  . IBS (irritable bowel syndrome) 03/21/2015  . Dense breast tissue 03/21/2015  . History of cholecystectomy 03/21/2015  . Abdominal carcinomatosis ([place]) 03/21/2015    Allyson Sabal [facility] 11/09/2018, 12:29 PM  [place], [place], [address] Phone: [phone]   Fax:  [phone]  Name: Kathryn Ramsey MRN: [id number] Date of Birth: [birth year]  Manus Gunning, PT 11/09/18 12:29 PM

## 2018-11-10 ENCOUNTER — Telehealth: Payer: Self-pay | Admitting: *Deleted

## 2018-11-10 NOTE — Telephone Encounter (Signed)
Pt left message on VM stating she has been approved for study at [facility] -phase 1 trial [id number] with or without pembrolizumab .  Kathryn Ramsey stated she will be admitted on Sunday the 27 for 4 days at [facility] for treatment.  " just want to touch base with my home team "  Return call number given as [phone].  Call returned- obtained pt's VM- validation , support and good thoughts with prayers given on message to her as well as request for her to call us next week with update.

## 2018-11-14 ENCOUNTER — Encounter: Payer: 59 | Admitting: Physical Therapy

## 2018-11-16 MED ORDER — CVS EAR DROPS OT
40.00 | OTIC | Status: DC
Start: 2018-11-16 — End: 2018-11-16

## 2018-11-16 MED ORDER — IBUPROFEN 600 MG PO TABS
600.00 | ORAL_TABLET | ORAL | Status: DC
Start: ? — End: 2018-11-16

## 2018-11-16 MED ORDER — DIPHENHYDRAMINE HCL 25 MG PO CAPS
50.00 | ORAL_CAPSULE | ORAL | Status: DC
Start: ? — End: 2018-11-16

## 2018-11-16 MED ORDER — GABAPENTIN 100 MG PO CAPS
200.00 | ORAL_CAPSULE | ORAL | Status: DC
Start: 2018-11-16 — End: 2018-11-16

## 2018-11-16 MED ORDER — Medication
5.00 | Status: DC
Start: ? — End: 2018-11-16

## 2018-11-16 MED ORDER — Medication
8.00 | Status: DC
Start: ? — End: 2018-11-16

## 2018-11-16 MED ORDER — SELECT BRAND INSULIN SYRINGE 29G X 1/2" 1 ML MISC
1.00 | Status: DC
Start: 2018-11-16 — End: 2018-11-16

## 2018-11-16 MED ORDER — QUINERVA 260 MG PO TABS
975.00 | ORAL_TABLET | ORAL | Status: DC
Start: ? — End: 2018-11-16

## 2018-11-16 MED ORDER — Medication
2.00 | Status: DC
Start: 2018-11-16 — End: 2018-11-16

## 2018-11-16 MED ORDER — SERTRALINE HCL 50 MG PO TABS
50.00 | ORAL_TABLET | ORAL | Status: DC
Start: 2018-11-17 — End: 2018-11-16

## 2018-11-16 MED ORDER — FAMOTIDINE 20 MG/2ML IV SOLN
20.00 | INTRAVENOUS | Status: DC
Start: ? — End: 2018-11-16

## 2018-11-16 MED ORDER — ONDANSETRON HCL 4 MG/2ML IJ SOLN
4.00 | INTRAMUSCULAR | Status: DC
Start: ? — End: 2018-11-16

## 2018-11-16 MED ORDER — FAMOTIDINE 20 MG PO TABS
20.00 | ORAL_TABLET | ORAL | Status: DC
Start: 2018-11-16 — End: 2018-11-16

## 2018-11-16 MED ORDER — TYLENOL WITH CODEINE #3 300-30 MG PO TABS
ORAL_TABLET | ORAL | Status: DC
Start: 2018-11-16 — End: 2018-11-16

## 2018-11-16 MED ORDER — LECITHIN-I-B6-CIDER VINEGAR 400-0.05-20-80 MG PO TABS
1.00 | ORAL_TABLET | ORAL | Status: DC
Start: 2018-11-17 — End: 2018-11-16

## 2018-11-16 MED ORDER — GENERIC EXTERNAL MEDICATION
1.00 | Status: DC
Start: 2018-11-17 — End: 2018-11-16

## 2018-11-16 MED ORDER — TUSSI PRES-B 2-15-200 MG/5ML PO LIQD
0.50 | ORAL | Status: DC
Start: ? — End: 2018-11-16

## 2018-11-16 MED ORDER — PIPERACILLIN SODIUM
5.00 | Status: DC
Start: ? — End: 2018-11-16

## 2018-11-20 ENCOUNTER — Ambulatory Visit: Payer: 59

## 2018-11-21 MED ORDER — SERTRALINE HCL 50 MG PO TABS
50.00 | ORAL_TABLET | ORAL | Status: DC
Start: 2018-11-22 — End: 2018-11-21

## 2018-11-21 MED ORDER — GENERIC EXTERNAL MEDICATION
1.00 | Status: DC
Start: 2018-11-21 — End: 2018-11-21

## 2018-11-21 MED ORDER — GABAPENTIN 100 MG PO CAPS
200.00 | ORAL_CAPSULE | ORAL | Status: DC
Start: 2018-11-21 — End: 2018-11-21

## 2018-11-21 MED ORDER — ACETAMINOPHEN-CODEINE #3 300-30 MG PO TABS
2.00 | ORAL_TABLET | ORAL | Status: DC
Start: ? — End: 2018-11-21

## 2018-11-21 MED ORDER — SENNOSIDES-DOCUSATE SODIUM 8.6-50 MG PO TABS
1.00 | ORAL_TABLET | ORAL | Status: DC
Start: 2018-11-21 — End: 2018-11-21

## 2018-11-21 MED ORDER — ONDANSETRON HCL 4 MG/2ML IJ SOLN
4.00 | INTRAMUSCULAR | Status: DC
Start: ? — End: 2018-11-21

## 2018-11-21 MED ORDER — IBUPROFEN 600 MG PO TABS
600.00 | ORAL_TABLET | ORAL | Status: DC
Start: ? — End: 2018-11-21

## 2018-11-21 MED ORDER — FEXOFENADINE HCL 60 MG PO TABS
60.00 | ORAL_TABLET | ORAL | Status: DC
Start: ? — End: 2018-11-21

## 2018-11-21 MED ORDER — BISACODYL 5 MG PO TBEC
10.00 | DELAYED_RELEASE_TABLET | ORAL | Status: DC
Start: ? — End: 2018-11-21

## 2018-11-21 MED ORDER — FAMOTIDINE 20 MG PO TABS
20.00 | ORAL_TABLET | ORAL | Status: DC
Start: 2018-11-21 — End: 2018-11-21

## 2018-11-21 MED ORDER — GENERIC EXTERNAL MEDICATION
8.00 | Status: DC
Start: ? — End: 2018-11-21

## 2018-11-29 ENCOUNTER — Ambulatory Visit: Payer: 59 | Attending: Adult Health

## 2018-11-29 ENCOUNTER — Other Ambulatory Visit: Payer: Self-pay

## 2018-11-29 DIAGNOSIS — G620 Drug-induced polyneuropathy: Secondary | ICD-10-CM | POA: Diagnosis present

## 2018-11-29 DIAGNOSIS — T451X5A Adverse effect of antineoplastic and immunosuppressive drugs, initial encounter: Secondary | ICD-10-CM | POA: Insufficient documentation

## 2018-11-29 DIAGNOSIS — M6281 Muscle weakness (generalized): Secondary | ICD-10-CM | POA: Diagnosis present

## 2018-11-29 DIAGNOSIS — R262 Difficulty in walking, not elsewhere classified: Secondary | ICD-10-CM | POA: Insufficient documentation

## 2018-11-29 NOTE — Therapy (Signed)
[place], [place], [address] Phone: [phone]   Fax:  [phone]  Physical Therapy Treatment  Patient Details  Name: Kathryn Ramsey MRN: [id number] Date of Birth: [birth year] Referring Provider (PT): Causey   Encounter Date: 11/29/2018  PT End of Session - 11/29/18 1053    Visit Number  10    Number of Visits  18    Date for PT Re-Evaluation  12/14/18    PT Start Time  1010   pt arrived late   PT Stop Time  1051    PT Time Calculation (min)  41 min       Past Medical History:  Diagnosis Date  . Acute sensory neuropathy 06/26/2015  . Allergy   . Anemia   . Asthma    illness induced asthma  . Blood transfusion without reported diagnosis    at age 28 years old D/T surgery to femur being crushed  . 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 Heart murmur    pt, states had a "working heart murmur"  . History of kidney stones   . Neutropenia, drug-induced ([place]) 03/10/2018  . PONV (postoperative nausea and vomiting)   . Skin cancer     Past Surgical History:  Procedure Laterality Date  . 3 laporscopic proceedures    . ABDOMINAL HYSTERECTOMY     2010  . CHOLECYSTECTOMY     2010  . DILATION AND CURETTAGE OF UTERUS     1997  . IR IMAGING GUIDED PORT INSERTION  03/08/2018  . LAPAROSCOPY N/A 03/27/2015   Procedure: DIAGNOSTIC LAPAROSCOPY ;  Surgeon: [name], MD;  [facility];  Service: Gynecology;  Laterality: N/A;  . LAPAROSCOPY N/A 02/24/2016   Procedure: LAPAROSCOPY DIAGNOSTIC WITH PERITONEAL WASHINGS AND PERITONEAL BIOPSY;  Surgeon: [name], MD;  [facility];  Service: Gynecology;  Laterality: N/A;  . LAPAROTOMY N/A 03/27/2015   Procedure: EXPLORATORY LAPAROTOMY, BILATERAL SALPINGO OOPHORECTOMY, OMENTECTOMY, RADICAL TUMOR DEBULKING;  Surgeon: [name], MD;  Location[facility];  Service: Gynecology;  Laterality:  N/A;  . sinus surgery 1994      There were no vitals filed for this visit.  Subjective Assessment - 11/29/18 1012    Subjective  I did the trial and was hospitalized for that over the past week and a half so they could monitor my reactions. After they got my immune system to calm down I handled it really well. I do still have residual soreness from my waist to upper thighs along with inflammation. The shots will continue for awhile if they work, so I don't know an end date for now.    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  No/denies   just tenderness at injection sites                      [facility] Adult PT Treatment/Exercise - 11/29/18 0001      Neuro Re-ed    Neuro Re-ed Details   In // bras: Added 3 lb ankle weights: slow and controlled high knee marching forward and backwards 2x each ; bil sidestepping with deep squat 1x each way;  heel-toe walking forward and backwards 2x each; then on side of black peds mat where sloped walked with head turns down and back 2x eaxh encouraging pt to walk with natural gait pattern, not rushed, and to focus where turning head back and forth, then heel-toe walking here as well 2x each on slope      Knee/Hip Exercises: Stretches   Passive Hamstring Stretch  Right;Left;2 reps;20 seconds   seated edge of chair   Piriformis Stretch  Right;Left;2 reps;20 seconds   at end of session     Knee/Hip Exercises: Aerobic   Elliptical  QuickStart (on ortho side) x5 min      Knee/Hip Exercises: Machines for Strengthening   Total Gym Leg Press  80# x 2 sets of 20    Hip Cybex  Bil hip 3 way raises with setting on 4 on each side, 25# x12 each and tactile and VCs for correct pelvic alignment and core  engagment throuhgout for correct technique                  PT Long Term Goals - 11/02/18 1037      PT LONG TERM GOAL #1   Title  Pt will report a 50% improvement in pain and tingling in bilateral feet to allow improved comfort    Baseline  still learning, 11/02/18- 25% improvement - comes and goes    Time  6    Period  Weeks    Status  On-going      PT LONG TERM GOAL #2   Title  Pt will demonstrate 5/5 left hamstring strength to allow pt to return to PLOF    Baseline  4/5, 11/02/18- 4+/5    Time  6    Period  Weeks    Status  On-going      PT LONG TERM GOAL #3   Title  Pt will demonstrate 5/5 R dorisflexor strength to decrase fall risk    Baseline  4/5, 11/02/18- 4+/5    Time  6    Period  Weeks    Status  On-going      PT LONG TERM GOAL #4   Title  Pt will be independent in a home exercise program for continued strengthening and stretching    Time  6    Period  Weeks    Status  On-going            Plan - 11/29/18 1054    Clinical Impression Statement  Pt returns after having clinical trial at [facility]. Though reporting some increased fatigue from being in hospital to be monitored during trial, she did well with continued higher level balance activities including adding ankle weights in // bars and reports being challenged by all.    Personal Factors and Comorbidities  Time since onset of injury/illness/exacerbation;Comorbidity 1    Comorbidities  cancer with multiple recurrences    Examination-Participation Restrictions  Community Activity    Stability/Clinical Decision Making  Evolving/Moderate complexity    Rehab Potential  Good    PT Frequency  1x / week    PT Duration  6 weeks    PT Treatment/Interventions  ADLs/Self Care Home Management;Electrical Stimulation;Moist Heat;Cryotherapy;Therapeutic exercise;Balance training;Therapeutic activities;Patient/family education;Manual techniques    PT Next Visit Plan  Cont exercise for hips and lower core, higher  level balance to include head turns and single leg, R d/f, elliptical    Consulted and Agree with Plan of Care  Patient  Patient will benefit from skilled therapeutic intervention in order to improve the following deficits and impairments:  Impaired sensation, Decreased strength, Pain  Visit Diagnosis: Difficulty in walking, not elsewhere classified  Peripheral neuropathy due to chemotherapy [facility])  Muscle weakness (generalized)     Problem List Patient Active Problem List   Diagnosis Date Noted  . Neuropathy due to chemotherapeutic drug ([place]) 07/24/2018  . Anemia due to antineoplastic chemotherapy 05/22/2018  . Port-A-Cath in place 05/01/2018  . Neutropenia, drug-induced ([place]) 03/10/2018  . Diarrhea 08/02/2017  . Lichen sclerosus et atrophicus of the vulva 03/31/2016  . Abdominal wall mass of right lower quadrant 03/08/2016  . 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 Goals of care, counseling/discussion 02/27/2016  . Elevated CA-125 02/24/2016  . Vaginal dryness, menopausal 10/01/2015  . Drug-induced peripheral neuropathy ([place]) 09/02/2015  . Leukopenia due to antineoplastic chemotherapy ([place]) 09/02/2015  . Chemotherapy-induced neuropathy ([place]) 08/19/2015  . Cellulitis 07/03/2015  . Acute sensory neuropathy 06/26/2015  . High risk medication use 06/21/2015  . Facial paresthesia 06/18/2015  . Genetic testing 06/16/2015  . Restless legs 05/14/2015  . Family history of breast cancer in female 04/29/2015  . Chemotherapy-induced nausea 04/22/2015  . Chemotherapy induced neutropenia ([place]) 04/22/2015  . Cancer of right fallopian tube ([place]) 04/07/2015  . History of hysterectomy for benign disease 03/21/2015  . IBS (irritable bowel syndrome) 03/21/2015  . Dense breast tissue 03/21/2015  . History of cholecystectomy 03/21/2015  . Abdominal carcinomatosis ([place]) 03/21/2015    Kathryn Ramsey, PTA 11/29/2018, 10:57 AM  [place], [place], [address] Phone: [phone]   Fax:  [phone]  Name: Kathryn Ramsey MRN: [id number] Date of Birth: August 12, 1965

## 2018-12-04 ENCOUNTER — Ambulatory Visit: Payer: 59

## 2018-12-04 ENCOUNTER — Other Ambulatory Visit: Payer: Self-pay

## 2018-12-04 DIAGNOSIS — M6281 Muscle weakness (generalized): Secondary | ICD-10-CM

## 2018-12-04 DIAGNOSIS — T451X5A Adverse effect of antineoplastic and immunosuppressive drugs, initial encounter: Secondary | ICD-10-CM

## 2018-12-04 DIAGNOSIS — R262 Difficulty in walking, not elsewhere classified: Secondary | ICD-10-CM

## 2018-12-04 DIAGNOSIS — G620 Drug-induced polyneuropathy: Secondary | ICD-10-CM

## 2018-12-04 NOTE — Therapy (Signed)
[place], [place], [address] Phone: [phone]   Fax:  [phone]  Physical Therapy Treatment  Patient Details  Name: Kathryn Ramsey MRN: [id number] Date of Birth: [birth year] Referring Provider (PT): Causey   Encounter Date: 12/04/2018  PT End of Session - 12/04/18 0852    Visit Number  11    Number of Visits  18    Date for PT Re-Evaluation  12/14/18    PT Start Time  0805    PT Stop Time  0845    PT Time Calculation (min)  40 min    Activity Tolerance  Patient tolerated treatment well    Behavior During Therapy  [facility] for tasks assessed/performed       Past Medical History:  Diagnosis Date  . Acute sensory neuropathy 06/26/2015  . Allergy   . Anemia   . Asthma    illness induced asthma  . Blood transfusion without reported diagnosis    at age 27 years old D/T surgery to femur being crushed  . 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 Heart murmur    pt, states had a "working heart murmur"  . History of kidney stones   . Neutropenia, drug-induced ([place]) 03/10/2018  . PONV (postoperative nausea and vomiting)   . Skin cancer     Past Surgical History:  Procedure Laterality Date  . 3 laporscopic proceedures    . ABDOMINAL HYSTERECTOMY     2010  . CHOLECYSTECTOMY     2010  . DILATION AND CURETTAGE OF UTERUS     1997  . IR IMAGING GUIDED PORT INSERTION  03/08/2018  . LAPAROSCOPY N/A 03/27/2015   Procedure: DIAGNOSTIC LAPAROSCOPY ;  Surgeon: [name], MD;  Location: [facility];  Service: Gynecology;  Laterality: N/A;  . LAPAROSCOPY N/A 02/24/2016   Procedure: LAPAROSCOPY DIAGNOSTIC WITH PERITONEAL WASHINGS AND PERITONEAL BIOPSY;  Surgeon: [name], MD;  Location: [facility];  Service: Gynecology;  Laterality: N/A;  . LAPAROTOMY N/A 03/27/2015   Procedure: EXPLORATORY LAPAROTOMY, BILATERAL SALPINGO OOPHORECTOMY, OMENTECTOMY,  RADICAL TUMOR DEBULKING;  Surgeon: [name], MD;  Location: [facility];  Service: Gynecology;  Laterality: N/A;  . sinus surgery 1994      There were no vitals filed for this visit.  Subjective Assessment - 12/04/18 0817    Subjective  I have another round of the trial shot today. I should be able to be in and out for the shots now that they know how I react after the hospital stay. Hopefully these end up being able to be done at home eventually and I don't have to keep going to [facility].    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  No/denies                       [facility] Adult PT Treatment/Exercise - 12/04/18 0001      Neuro Re-ed    Neuro Re-ed Details   In // bras: Added 3 lb ankle weights for slow and controlled high knee marching forward 2x ; heel-toe walking forward and backwards 2x each with eyes closed and fingertips skimming the bar;  then on side of black peds mat where sloped walked with head turns down and back 2x eaxh encouraging pt to walk with natural gait pattern, not rushed, and to focus when turning head back and forth 2x each and seemed more challenging than usual for pt today, then heel-toe walking here as well 1x each direction on slope with PTA near for SBA      Knee/Hip Exercises: Stretches   Passive Hamstring Stretch  Right;Left;2 reps;20 seconds   varied positions througout session in chair and in standing   Piriformis Stretch  Right;Left;2 reps;20 seconds   in varied positions during session in sitting and standing   Gastroc Stretch  Right;Left;2 reps;20 seconds   at different times during session in standing     Knee/Hip Exercises: Aerobic   Elliptical  QuickStart (on ortho side) x5 min      Knee/Hip  Exercises: Machines for Strengthening   Total Gym Leg Press  80# x 2 sets of 20; then calf presses 80# x20     Hip Cybex  Bil hip 3 way raises with setting on 4 on each side, 25# x12 each and tactile and VCs for correct pelvic alignment and core engagment throuhgout for correct technique      Knee/Hip Exercises: Standing   Forward Step Up  Right;Left;10 reps   on steps in peds gym, no HHA                 PT Long Term Goals - 11/02/18 1037      PT LONG TERM GOAL #1   Title  Pt will report a 50% improvement in pain and tingling in bilateral feet to allow improved comfort    Baseline  still learning, 11/02/18- 25% improvement - comes and goes    Time  6    Period  Weeks    Status  On-going      PT LONG TERM GOAL #2   Title  Pt will demonstrate 5/5 left hamstring strength to allow pt to return to PLOF    Baseline  4/5, 11/02/18- 4+/5    Time  6    Period  Weeks    Status  On-going      PT LONG TERM GOAL #3   Title  Pt will demonstrate 5/5 R dorisflexor strength to decrase fall risk    Baseline  4/5, 11/02/18- 4+/5    Time  6    Period  Weeks    Status  On-going      PT LONG TERM GOAL #4   Title  Pt will be independent in a home exercise program for continued strengthening and stretching    Time  6    Period  Weeks    Status  On-going            Plan - 12/04/18 [id number]    Clinical Impression Statement  Pt continues to be challenged by high level balance activities and machines. She begins another 2 week round of daily shots at [facility] for trial but does not require hospitalization this time, just has to drive to [place] daily. This may or may not effect next weeks session (fatigue or pain from injections).    Personal Factors and Comorbidities  Time since onset of injury/illness/exacerbation;Comorbidity 1    Comorbidities  cancer with multiple recurrences    Examination-Participation Restrictions  Community Activity    Stability/Clinical Decision Making  Evolving/Moderate  complexity    Rehab Potential  Good    PT  Frequency  1x / week    PT Duration  6 weeks    PT Treatment/Interventions  ADLs/Self Care Home Management;Electrical Stimulation;Moist Heat;Cryotherapy;Therapeutic exercise;Balance training;Therapeutic activities;Patient/family education;Manual techniques    PT Next Visit Plan  See how pt is feeling as she will be in the midst of r weeks of daily shots, may need to adjust treatment accordingly; Cont exercise for hips and lower core, higher level balance to include head turns and single leg, R d/f, elliptical    Consulted and Agree with Plan of Care  Patient       Patient will benefit from skilled therapeutic intervention in order to improve the following deficits and impairments:  Impaired sensation, Decreased strength, Pain  Visit Diagnosis: Difficulty in walking, not elsewhere classified  Peripheral neuropathy due to chemotherapy (HCC)  Muscle weakness (generalized)     Problem List Patient Active Problem List   Diagnosis Date Noted  . Neuropathy due to chemotherapeutic drug ([place]) 07/24/2018  . Anemia due to antineoplastic chemotherapy 05/22/2018  . Port-A-Cath in place 05/01/2018  . Neutropenia, drug-induced ([place]) 03/10/2018  . Diarrhea 08/02/2017  . Lichen sclerosus et atrophicus of the vulva 03/31/2016  . Abdominal wall mass of right lower quadrant 03/08/2016  . 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 Goals of care, counseling/discussion 02/27/2016  . Elevated CA-125 02/24/2016  . Vaginal dryness, menopausal 10/01/2015  . Drug-induced peripheral neuropathy ([place]) 09/02/2015  . Leukopenia due to antineoplastic chemotherapy ([place]) 09/02/2015  . Chemotherapy-induced neuropathy ([place]) 08/19/2015  . Cellulitis 07/03/2015  . Acute sensory neuropathy 06/26/2015  . High risk medication use 06/21/2015  . Facial paresthesia 06/18/2015  . Genetic testing 06/16/2015  . Restless legs 05/14/2015  . Family history of breast cancer in  female 04/29/2015  . Chemotherapy-induced nausea 04/22/2015  . Chemotherapy induced neutropenia ([place]) 04/22/2015  . Cancer of right fallopian tube ([place]) 04/07/2015  . History of hysterectomy for benign disease 03/21/2015  . IBS (irritable bowel syndrome) 03/21/2015  . Dense breast tissue 03/21/2015  . History of cholecystectomy 03/21/2015  . Abdominal carcinomatosis ([place]) 03/21/2015    Otelia Limes, PTA 12/04/2018, 8:57 AM  [place], [place], [address] Phone: [phone]   Fax:  [phone]  Name: Kathryn Ramsey MRN: [id number] Date of Birth: [birth year]

## 2018-12-12 ENCOUNTER — Encounter: Payer: 59 | Admitting: Physical Therapy

## 2018-12-18 ENCOUNTER — Ambulatory Visit: Payer: 59 | Attending: Adult Health

## 2018-12-18 ENCOUNTER — Other Ambulatory Visit: Payer: Self-pay

## 2018-12-18 DIAGNOSIS — T451X5A Adverse effect of antineoplastic and immunosuppressive drugs, initial encounter: Secondary | ICD-10-CM | POA: Insufficient documentation

## 2018-12-18 DIAGNOSIS — R262 Difficulty in walking, not elsewhere classified: Secondary | ICD-10-CM | POA: Diagnosis not present

## 2018-12-18 DIAGNOSIS — M6281 Muscle weakness (generalized): Secondary | ICD-10-CM

## 2018-12-18 DIAGNOSIS — M62838 Other muscle spasm: Secondary | ICD-10-CM

## 2018-12-18 DIAGNOSIS — G620 Drug-induced polyneuropathy: Secondary | ICD-10-CM | POA: Diagnosis present

## 2018-12-18 NOTE — Therapy (Addendum)
[place], [place], [address] Phone: [phone]   Fax:  [phone]  Physical Therapy Treatment  Patient Details  Name: Kathryn Ramsey MRN: [id number] Date of Birth: 1965-05-16 Referring Provider (PT): Causey   Encounter Date: 12/18/2018  PT End of Session - 12/18/18 1038    Visit Number  12    Number of Visits  18    Date for PT Re-Evaluation  12/14/18    PT Start Time  1006   pt arrived late   PT Stop Time  1057    PT Time Calculation (min)  51 min    Activity Tolerance  Patient tolerated treatment well    Behavior During Therapy  [facility] for tasks assessed/performed       Past Medical History:  Diagnosis Date  . Acute sensory neuropathy 06/26/2015  . Allergy   . Anemia   . Asthma    illness induced asthma  . Blood transfusion without reported diagnosis    at age 53 years old D/T surgery to femur being crushed  . 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 Heart murmur    pt, states had a "working heart murmur"  . History of kidney stones   . Neutropenia, drug-induced ([place]) 03/10/2018  . PONV (postoperative nausea and vomiting)   . Skin cancer     Past Surgical History:  Procedure Laterality Date  . 3 laporscopic proceedures    . ABDOMINAL HYSTERECTOMY     2010  . CHOLECYSTECTOMY     2010  . DILATION AND CURETTAGE OF UTERUS     1997  . IR IMAGING GUIDED PORT INSERTION  03/08/2018  . LAPAROSCOPY N/A 03/27/2015   Procedure: DIAGNOSTIC LAPAROSCOPY ;  Surgeon: [name], MD;  [facility]: [facility];  Service: Gynecology;  Laterality: N/A;  . LAPAROSCOPY N/A 02/24/2016   Procedure: LAPAROSCOPY DIAGNOSTIC WITH PERITONEAL WASHINGS AND PERITONEAL BIOPSY;  Surgeon: [name], MD;  Location: [facility];  Service: Gynecology;  Laterality: N/A;  . LAPAROTOMY N/A 03/27/2015   Procedure: EXPLORATORY LAPAROTOMY, BILATERAL SALPINGO OOPHORECTOMY,  OMENTECTOMY, RADICAL TUMOR DEBULKING;  Surgeon: [name], MD;  Location: [facility];  Service: Gynecology;  Laterality: N/A;  . sinus surgery 1994      There were no vitals filed for this visit.  Subjective Assessment - 12/18/18 1009    Subjective  I can tell my confidence has gotten much better with walking and my balance and my strength is improving for sure. I did have an incidence last week when I went for one of my injections after sitting in waiting room for awhile and my Rt foot fell asleep and I about fell. But overall I know what to do at home and so I guess I shold D/C even though I have really enjoyed coming to get stronger and be motivated.    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  Yes    Pain Score  2     Pain Location  Abdomen   at injection sites from trial drug   Pain Orientation  Right;Left    Pain Descriptors / Indicators  Tender    Pain Type  Acute pain    Pain Onset  1 to 4 weeks ago    Aggravating Factors   is just at injection sites    Pain Relieving Factors  just feels better after awhile                       [facility] Adult PT Treatment/Exercise - 12/18/18 0001      Neuro Re-ed    Neuro Re-ed Details   In // bras: Added 3 lb ankle weights for slow and controlled high knee marching forward and back 2x ; then standing on blue oval for hip 3 way raises with 3# still on ankle, 2x10 each      Knee/Hip Exercises: Stretches   Passive Hamstring Stretch  Right;Left;2 reps;20 seconds   seated in chair   Piriformis Stretch  Right;Left;2 reps;20 seconds    Gastroc Stretch  Right;Left;2 reps;20 seconds      Knee/Hip Exercises: Aerobic   Elliptical  QuickStart (on ortho side) x5 min      Knee/Hip Exercises:  Machines for Strengthening   Total Gym Leg Press  80# x 2 sets of 20; then calf presses 80# x20, x10     Hip Cybex  Bil hip 3 way raises with setting on 4 on each side, 25# x12 each, pt is able to perform now with very minor to no VCs for correct technique                  PT Long Term Goals - 12/18/18 1015      PT LONG TERM GOAL #1   Title  Pt will report a 50% improvement in pain and tingling in bilateral feet to allow improved comfort    Baseline  still learning, 11/02/18- 25% improvement - comes and goes; about 25% improvement with tingling but pain is 90% better-12/18/18    Status  Partially Met      PT LONG TERM GOAL #2   Title  Pt will demonstrate 5/5 left hamstring strength to allow pt to return to PLOF    Baseline  4/5, 11/02/18- 4+/5; 4+/5 - 12/18/18    Status  Partially Met      PT LONG TERM GOAL #3   Title  Pt will demonstrate 5/5 R dorisflexor strength to decrase fall risk    Baseline  4/5, 11/02/18- 4+/5; 5/5 - 12/18/18    Status  Achieved      PT LONG TERM GOAL #4   Title  Pt will be independent in a home exercise program for continued strengthening and stretching    Status  Achieved            Plan - 12/18/18 1040    Clinical Impression Statement  Spent beginning of session reviewing pts goals and assessing current progress along with also testing strength for goal check. Overall pt has made excellent progress and met all goals except Lt HS strength to be 5/5, but this did improve to 4+/5 so partially met. Pt is now ready for D/C at this time.    Personal Factors and Comorbidities  Time since onset of injury/illness/exacerbation;Comorbidity 1    Comorbidities  cancer with multiple recurrences    Examination-Participation Restrictions  Community Activity    Stability/Clinical Decision Making  Evolving/Moderate complexity    Rehab Potential  Good    PT Frequency  1x / week    PT Duration  6 weeks    PT Treatment/Interventions  ADLs/Self Care Home  Management;Electrical Stimulation;Moist Heat;Cryotherapy;Therapeutic exercise;Balance training;Therapeutic activities;Patient/family education;Manual techniques    PT Next Visit Plan  D/C this visit.    Consulted and Agree with Plan of Care  Patient       Patient will benefit from skilled therapeutic intervention in order to improve the following deficits and impairments:  Impaired sensation, Decreased strength, Pain  Visit Diagnosis: Difficulty in walking, not elsewhere classified  Peripheral neuropathy due to chemotherapy (HCC)  Muscle weakness (generalized)  Other muscle spasm     Problem List Patient Active Problem List   Diagnosis Date Noted  . Neuropathy due to chemotherapeutic drug ([place]) 07/24/2018  . Anemia due to antineoplastic chemotherapy 05/22/2018  . Port-A-Cath in place 05/01/2018  . Neutropenia, drug-induced ([place]) 03/10/2018  . Diarrhea 08/02/2017  . Lichen sclerosus et atrophicus of the vulva 03/31/2016  . Abdominal wall mass of right lower quadrant 03/08/2016  . 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 Goals of care, counseling/discussion 02/27/2016  . Elevated CA-125 02/24/2016  . Vaginal dryness, menopausal 10/01/2015  . Drug-induced peripheral neuropathy ([place]) 09/02/2015  . Leukopenia due to antineoplastic chemotherapy ([place]) 09/02/2015  . Chemotherapy-induced neuropathy ([place]) 08/19/2015  . Cellulitis 07/03/2015  . Acute sensory neuropathy 06/26/2015  . High risk medication use 06/21/2015  . Facial paresthesia 06/18/2015  . Genetic testing 06/16/2015  . Restless legs 05/14/2015  . Family history of breast cancer in female 04/29/2015  . Chemotherapy-induced nausea 04/22/2015  . Chemotherapy induced neutropenia ([place]) 04/22/2015  . Cancer of right fallopian tube ([place]) 04/07/2015  . History of hysterectomy for benign disease 03/21/2015  . IBS (irritable bowel syndrome) 03/21/2015  . Dense breast tissue 03/21/2015  . History of cholecystectomy  03/21/2015  . Abdominal carcinomatosis ([place]) 03/21/2015    Otelia Limes, PTA 12/18/2018, 12:04 PM  [place], [place], [address] Phone: [phone]   Fax:  [phone]  Name: Kathryn Ramsey MRN: [id number] Date of Birth: November 02, 1965  PHYSICAL THERAPY DISCHARGE SUMMARY  Visits from Start of Care: 12  Current functional level related to goals / functional outcomes: Goals all met or partially met   Remaining deficits: Left hamstring strength just shy of meeting goal. Pt will continue to work on this with HEP at home.    Education / Equipment: HEP  Plan: Patient agrees to discharge.  Patient goals were met. Patient is being discharged due to meeting the stated rehab goals.  ?????     [facility] [place], [place] 12/18/18 1:24 PM

## 2018-12-29 ENCOUNTER — Encounter: Payer: Self-pay | Admitting: Oncology

## 2019-01-10 ENCOUNTER — Other Ambulatory Visit: Payer: Self-pay

## 2019-01-10 ENCOUNTER — Other Ambulatory Visit: Payer: Self-pay | Admitting: Oncology

## 2019-01-10 ENCOUNTER — Encounter: Payer: Self-pay | Admitting: Oncology

## 2019-01-10 ENCOUNTER — Emergency Department (HOSPITAL_COMMUNITY)
Admission: EM | Admit: 2019-01-10 | Discharge: 2019-01-10 | Disposition: A | Discharge status: Home / Self Care | Payer: 59 | Attending: Emergency Medicine | Admitting: Emergency Medicine

## 2019-01-10 ENCOUNTER — Encounter (HOSPITAL_COMMUNITY): Payer: Self-pay | Admitting: Emergency Medicine

## 2019-01-10 ENCOUNTER — Other Ambulatory Visit: Payer: Self-pay | Admitting: *Deleted

## 2019-01-10 ENCOUNTER — Emergency Department (HOSPITAL_COMMUNITY): Payer: 59

## 2019-01-10 ENCOUNTER — Telehealth: Payer: Self-pay | Admitting: *Deleted

## 2019-01-10 DIAGNOSIS — R079 Chest pain, unspecified: Secondary | ICD-10-CM | POA: Diagnosis not present

## 2019-01-10 DIAGNOSIS — J45909 Unspecified asthma, uncomplicated: Secondary | ICD-10-CM | POA: Insufficient documentation

## 2019-01-10 DIAGNOSIS — Z79899 Other long term (current) drug therapy: Secondary | ICD-10-CM | POA: Diagnosis not present

## 2019-01-10 DIAGNOSIS — C786 Secondary malignant neoplasm of retroperitoneum and peritoneum: Secondary | ICD-10-CM

## 2019-01-10 DIAGNOSIS — J90 Pleural effusion, not elsewhere classified: Secondary | ICD-10-CM | POA: Insufficient documentation

## 2019-01-10 DIAGNOSIS — R0602 Shortness of breath: Secondary | ICD-10-CM | POA: Diagnosis present

## 2019-01-10 NOTE — ED Provider Notes (Signed)
[facility] DEPT Provider Note   CSN: [id number] Arrival date & time: 01/10/19  1557     History   Chief Complaint Chief Complaint  Patient presents with  . Shortness of Breath    HPI Kathryn Ramsey is a 53 y.o. female.     HPI  53 year old female presents with shortness of breath.  Has a known left pleural effusion from a CT scan at [facility] last week.  Has been doing with this for about a week or so.  No cough but a little bit of discomfort in her chest on that side as well as shortness of breath when she walks or exerts herself.  The shortness of breath is there but not terrible.  No fevers but her temperature was 99 when she checked tonight.  She denies any leg swelling.  When she called her oncologist they told her to come to the hospital.  Past Medical History:  Diagnosis Date  . Acute sensory neuropathy 06/26/2015  . Allergy   . Anemia   . Asthma    illness induced asthma  . Blood transfusion without reported diagnosis    at age 54 years old D/T surgery to femur being crushed  . 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 Heart murmur    pt, states had a "working heart murmur"  . History of kidney stones   . Neutropenia, drug-induced ([place]) 03/10/2018  . PONV (postoperative nausea and vomiting)   . Skin cancer     Patient Active Problem List   Diagnosis Date Noted  . Neuropathy due to chemotherapeutic drug ([place]) 07/24/2018  . Anemia due to antineoplastic chemotherapy 05/22/2018  . Port-A-Cath in place 05/01/2018  . Neutropenia, drug-induced ([place]) 03/10/2018  . Diarrhea 08/02/2017  . Lichen sclerosus et atrophicus of the vulva 03/31/2016  . Abdominal wall mass of right lower quadrant 03/08/2016  . 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 Goals of care, counseling/discussion 02/27/2016  . Elevated CA-125 02/24/2016  . Vaginal dryness, menopausal 10/01/2015  .  Drug-induced peripheral neuropathy ([place]) 09/02/2015  . Leukopenia due to antineoplastic chemotherapy ([place]) 09/02/2015  . Chemotherapy-induced neuropathy ([place]) 08/19/2015  . Cellulitis 07/03/2015  . Acute sensory neuropathy 06/26/2015  . High risk medication use 06/21/2015  . Facial paresthesia 06/18/2015  . Genetic testing 06/16/2015  . Restless legs 05/14/2015  . Family history of breast cancer in female 04/29/2015  . Chemotherapy-induced nausea 04/22/2015  . Chemotherapy induced neutropenia ([place]) 04/22/2015  . Cancer of right fallopian tube ([place]) 04/07/2015  . History of hysterectomy for benign disease 03/21/2015  . IBS (irritable bowel syndrome) 03/21/2015  . Dense breast tissue 03/21/2015  . History of cholecystectomy 03/21/2015  . Abdominal carcinomatosis ([place]) 03/21/2015    Past Surgical History:  Procedure Laterality Date  . 3 laporscopic proceedures    . ABDOMINAL HYSTERECTOMY     2010  . CHOLECYSTECTOMY     2010  . DILATION AND CURETTAGE OF UTERUS     1997  . IR IMAGING GUIDED PORT INSERTION  03/08/2018  . LAPAROSCOPY N/A 03/27/2015   Procedure: DIAGNOSTIC LAPAROSCOPY ;  Surgeon: [name], MD;  Location: [facility] ORS;  Service: Gynecology;  Laterality: N/A;  . LAPAROSCOPY N/A 02/24/2016   Procedure: LAPAROSCOPY DIAGNOSTIC WITH PERITONEAL WASHINGS AND PERITONEAL BIOPSY;  Surgeon: [name], MD;  Location: [facility] ORS;  Service: Gynecology;  Laterality: N/A;  . LAPAROTOMY N/A 03/27/2015  Procedure: EXPLORATORY LAPAROTOMY, BILATERAL SALPINGO OOPHORECTOMY, OMENTECTOMY, RADICAL TUMOR DEBULKING;  Surgeon: [name], MD;  Location: [facility]S;  Service: Gynecology;  Laterality: N/A;  . sinus surgery 1994       OB History   No obstetric history on file.      Home Medications    Prior to Admission medications   Medication Sig Start Date End Date Taking? Authorizing Provider  acetaminophen (TYLENOL) 500 MG tablet Take 2 tablets by mouth 3 (three) times daily as needed. 11/16/18  Yes  [provider]  hydrocortisone cream 1 % Apply 1 application topically 3 (three) times daily. 11/16/18 11/16/19 Yes [provider]  ibuprofen (ADVIL) 600 MG tablet Take 1 tablet by mouth 4 (four) times daily as needed. 11/16/18  Yes [provider]  senna-docusate (SENOKOT-S) 8.6-50 MG tablet Take 1-2 tablets by mouth 2 (two) times daily as needed. 11/21/18  Yes [provider]  acetaminophen-codeine (TYLENOL #3) 300-30 MG tablet Take 2 tablets by mouth 4 (four) times daily as needed. 11/21/18   [provider]  diphenhydrAMINE (BENADRYL) 50 MG capsule 50 mg Diphenhydramine (Benadryl) PO within 1 hour of the injection 11/28/17 11/28/17  Causey, Charlestine Massed, NP  fluticasone (FLONASE) 50 MCG/ACT nasal spray Place 1 spray into both nostrils 2 (two) times daily. Patient taking differently: Place 1 spray into both nostrils as needed.  08/16/17   Tanner, Lyndon Code., PA-C  gabapentin (NEURONTIN) 100 MG capsule Take 2 capsules (200 mg total) by mouth 3 (three) times daily. 07/26/18   [name], MD  lidocaine-prilocaine (EMLA) cream APPLY TOPICALLY AS NEEDED. 03/29/18   Magrinat, Virgie Dad, MD  loratadine (CLARITIN) 10 MG tablet Take 10 mg by mouth as needed.     [provider]  magnesium oxide (MAG-OX) 400 (241.3 Mg) MG tablet Take 1 tablet (400 mg total) by mouth daily. 02/17/18   Magrinat, Virgie Dad, MD  Multiple Vitamins-Minerals (MULTIVITAMIN ADULT PO) Take 1 tablet by mouth daily.    [provider]  Omega 3 1000 MG CAPS Take 1 capsule by mouth daily.    [provider]  omeprazole (PRILOSEC) 40 MG capsule Take 1 capsule (40 mg total) by mouth daily. 05/22/18   Magrinat, Virgie Dad, MD  sertraline (ZOLOFT) 50 MG tablet TAKE 1 TABLET ONCE DAILY. 10/27/18   Magrinat, Virgie Dad, MD  valACYclovir (VALTREX) 1000 MG tablet Take 1 tablet (1,000 mg total) by mouth daily. 05/22/18   Magrinat, Virgie Dad, MD  vitamin B-12 (CYANOCOBALAMIN) 1000 MCG  tablet Take 1 tablet by mouth daily.    [provider]    Family History Family History  Problem Relation Age of Onset  . Hypertension Father   . Skin cancer Father        nonmelanoma skin cancers in his late 11s  . Non-Hodgkin's lymphoma Maternal Aunt        dx. 74s; smoker  . Stroke Maternal Grandmother   . Other Mother        benign meningioma dx. early-mid-70s; hysterectomy in her late 18s for heavy periods - still has ovaries  . Other Son        one son with pre-cancerous skin findings  . Other Daughter        cysts on ovaries and hx of heavy periods  . Other Sister        hysterectomy for cysts - still has ovaries  . Heart attack Maternal Grandfather   . Heart attack Paternal Grandfather   .  Renal cancer Maternal Aunt 60       smoker  . Breast cancer Maternal Aunt 78  . Other Maternal Aunt        dx. benign brain tumor (meningioma) at 28; 2nd benign brain tumor in her late 82s; hx of radical hysterectomy at age 22  . Brain cancer Other        NOS tumor    Social History Social History   Tobacco Use  . Smoking status: Never Smoker  . Smokeless tobacco: Never Used  Substance Use Topics  . Alcohol use: Yes    Comment:  3 glasses wine or beer/week  . Drug use: No     Allergies   Bee venom, Compazine [prochlorperazine edisylate], Sulfa antibiotics, and Zarxio [filgrastim]   Review of Systems Review of Systems  Constitutional: Negative for fever.  Respiratory: Positive for shortness of breath. Negative for cough.   Cardiovascular: Positive for chest pain. Negative for leg swelling.  Gastrointestinal: Negative for abdominal distention, abdominal pain and vomiting.  All other systems reviewed and are negative.    Physical Exam Updated Vital Signs BP 124/77   Pulse 87   Temp 98.8 F (37.1 C) (Oral)   Resp 18   Ht 5\' 3"  (1.6 m)   Wt 55.3 kg   SpO2 99%   BMI 21.61 kg/m   Physical Exam Vitals signs and nursing note reviewed.   Constitutional:      Appearance: She is well-developed.  HENT:     Head: Normocephalic and atraumatic.     Right Ear: External ear normal.     Left Ear: External ear normal.     Nose: Nose normal.  Eyes:     General:        Right eye: No discharge.        Left eye: No discharge.  Cardiovascular:     Rate and Rhythm: Normal rate and regular rhythm.     Heart sounds: Normal heart sounds.  Pulmonary:     Effort: Pulmonary effort is normal. No tachypnea or accessory muscle usage.     Breath sounds: Examination of the left-lower field reveals decreased breath sounds. Decreased breath sounds present.  Abdominal:     Palpations: Abdomen is soft.     Tenderness: There is no abdominal tenderness.  Musculoskeletal:     Right lower leg: No edema.     Left lower leg: No edema.  Skin:    General: Skin is warm and dry.  Neurological:     Mental Status: She is alert.  Psychiatric:        Mood and Affect: Mood is not anxious.      ED Treatments / Results  Labs (all labs ordered are listed, but only abnormal results are displayed) Labs Reviewed - No data to display  EKG EKG Interpretation  Date/Time:  Wednesday January 10 2019 16:50:11 EST Ventricular Rate:  83 PR Interval:    QRS Duration: 90 QT Interval:  373 QTC Calculation: 439 R Axis:   67 Text Interpretation: Sinus rhythm no acute ST/T changes no significant change since 2017 Confirmed by Sherwood Gambler [phone]) on 01/10/2019 6:28:03 PM   Radiology Dg Chest 2 View  Result Date: 01/10/2019 CLINICAL DATA:  Shortness of breath. Fever. Chest pressure. EXAM: CHEST - 2 VIEW COMPARISON:  Lung bases from abdominal CT 09/28/2018. Chest CT PET CT 06/21/2018 FINDINGS: Right chest port remains in place. Moderate left pleural effusion with adjacent atelectasis/airspace disease. The right lung is clear. Normal  heart size and mediastinal contours. No pneumothorax. No acute osseous abnormalities are seen. IMPRESSION: Moderate left  pleural effusion with adjacent atelectasis/airspace disease. Electronically Signed   By: Keith Rake M.D.   On: 01/10/2019 17:31    Procedures Procedures (including critical care time)  Medications Ordered in ED Medications - No data to display   Initial Impression / Assessment and Plan / ED Course  I have reviewed the triage vital signs and the nursing notes.  Pertinent labs & imaging results that were available during my care of the patient were reviewed by me and considered in my medical decision making (see chart for details).        Patient does not have any increased work of breathing.  She feels okay and does need a thoracentesis but not emergently.  It is not available at this time of night here.  I discussed with radiology on-call, Dr. Joelyn Oms, who has given me the PA line to leave a message for them to call her tomorrow to schedule.  I think this is pretty reasonable.  She had a low-grade temperature but no overt fever.  She had labs drawn at [facility] yesterday which showed a normal WBC.  I do not think she needs further lab work here and she agrees.  We discussed return precautions but otherwise we will plan for outpatient thoracentesis.  Final Clinical Impressions(s) / ED Diagnoses   Final diagnoses:  Pleural effusion on left    ED Discharge Orders         Ordered    US THORACENTESIS ASP PLEURAL SPACE W/IMG GUIDE     01/10/19 Kai Levins, MD 01/10/19 1920

## 2019-01-10 NOTE — ED Notes (Signed)
Pt verbalizes understanding of DC instructions. Pt belongings returned and is ambulatory out of ED.  

## 2019-01-10 NOTE — ED Triage Notes (Signed)
Patient reports hx ovarian cancer. States seen at [facility] on Friday and CT showed fluid on heart and lungs. C/o worsening SOB and chest pressure. Denies cough.

## 2019-01-10 NOTE — Discharge Instructions (Signed)
You will be called tomorrow to help schedule your outpatient thoracentesis.  Do not eat or drink after midnight tonight in case they can do it tomorrow.  Otherwise, if you develop fever, severe worsening shortness of breath, or any other new/concerning symptoms then return to the ER for evaluation.

## 2019-01-10 NOTE — Telephone Encounter (Signed)
Received mychart message from pt stating she is experiencing shortness of breath due to increased fluid on her lungs.  RN placed call to pt and pt states she is experiencing a low grade fever of 99.9, chest pressure, shortness of breath, and unable to sleep at night due to the shortness of breath.  Per Dr. Jana Hakim, pt needs to be seen in the [facility] emergency department for further evaluation and workup.  Pt verbalized understanding.

## 2019-01-10 NOTE — Telephone Encounter (Signed)
error 

## 2019-01-10 NOTE — ED Notes (Signed)
Regenia Skeeter MD at bedside

## 2019-01-11 ENCOUNTER — Other Ambulatory Visit: Payer: Self-pay | Admitting: Oncology

## 2019-01-16 ENCOUNTER — Other Ambulatory Visit: Payer: Self-pay | Admitting: Oncology

## 2019-02-06 ENCOUNTER — Other Ambulatory Visit: Payer: Self-pay | Admitting: Oncology

## 2019-02-06 ENCOUNTER — Encounter: Payer: Self-pay | Admitting: Oncology

## 2019-02-06 ENCOUNTER — Other Ambulatory Visit: Payer: Self-pay | Admitting: *Deleted

## 2019-02-06 DIAGNOSIS — C762 Malignant neoplasm of abdomen: Secondary | ICD-10-CM

## 2019-02-06 NOTE — Progress Notes (Signed)
DISCONTINUE OFF PATHWAY REGIMEN - Ovarian   OFF00169:Carboplatin AUC=5 D1 + Gemcitabine 800 mg/m2 D1, 8 q28 Days:   A cycle is every 28 days:     Gemcitabine      Carboplatin   **Always confirm dose/schedule in your pharmacy ordering system**  REASON: Disease Progression PRIOR TREATMENT: Off Pathway: Carboplatin AUC=5 D1 + Gemcitabine 800 mg/m2 D1, 8 q28 Days TREATMENT RESPONSE: Progressive Disease (PD)  START OFF PATHWAY REGIMEN - Ovarian   [id number]:Nab-Paclitaxel (Abraxane) 100 mg/m2 D1,8,15 + Bevacizumab 10 mg/kg D1,15 q28 days:   A cycle is every 28 days (3 weeks on and 1 week off):     Nab-paclitaxel (protein bound)      Bevacizumab-xxxx   **Always confirm dose/schedule in your pharmacy ordering system**  Patient Characteristics: Recurrent or Progressive Disease, Fourth Line and Beyond, BRCA Mutation Absent Therapeutic Status: Recurrent or Progressive Disease BRCA Mutation Status: Absent Line of Therapy: Fourth Line and Beyond  Intent of Therapy: Non-Curative / Palliative Intent, Discussed with Patient

## 2019-02-06 NOTE — Progress Notes (Unsigned)
I was called by Dr Sherol Dade. Unfortunately she progressed through her study drug. She suggests we try abraxane. Bevacizumab, and if after 3-4 cycles there is not response try doxil/bev.  We are trying to schedule a paracentesis for her today.  She will see me 1/5 and we will discuss options with treatment to start 1/7. We will also discuss advanced directives at that time

## 2019-02-07 ENCOUNTER — Telehealth: Payer: Self-pay | Admitting: Oncology

## 2019-02-07 ENCOUNTER — Ambulatory Visit (HOSPITAL_COMMUNITY): Payer: 59

## 2019-02-07 NOTE — Telephone Encounter (Signed)
Scheduled appt per 12/22 sch message - pt is aware of appt date and time   

## 2019-02-12 ENCOUNTER — Encounter: Payer: Self-pay | Admitting: Oncology

## 2019-02-14 ENCOUNTER — Encounter: Payer: Self-pay | Admitting: Oncology

## 2019-02-15 ENCOUNTER — Encounter: Payer: Self-pay | Admitting: *Deleted

## 2019-02-15 ENCOUNTER — Other Ambulatory Visit: Payer: Self-pay | Admitting: *Deleted

## 2019-02-15 ENCOUNTER — Other Ambulatory Visit: Payer: Self-pay | Admitting: Oncology

## 2019-02-15 DIAGNOSIS — C5701 Malignant neoplasm of right fallopian tube: Secondary | ICD-10-CM

## 2019-02-15 MED ORDER — OXYCODONE HCL 5 MG PO TABS: 5.0000 mg | ORAL_TABLET | ORAL | 0 refills | Status: DC | PRN

## 2019-02-15 MED ORDER — GABAPENTIN 100 MG PO CAPS: 200.0000 mg | ORAL_CAPSULE | Freq: Three times a day (TID) | ORAL | 1 refills | Status: DC

## 2019-02-15 MED ORDER — TRAMADOL HCL 50 MG PO TABS: 50.0000 mg | ORAL_TABLET | Freq: Four times a day (QID) | ORAL | 0 refills | Status: DC | PRN

## 2019-02-15 MED ORDER — ACETAMINOPHEN-CODEINE #3 300-30 MG PO TABS: 2.0000 | ORAL_TABLET | Freq: Four times a day (QID) | ORAL | 0 refills | Status: DC | PRN

## 2019-02-18 NOTE — Progress Notes (Signed)
(  x) participated in AMV-564 trial at [facility] (a CD33-T-cell bivalent Ab that may restore anti-cancer immune effectiveness) 20. 10/25/18 Consenting for AMV-564 clinical trial 21. 11/13/18 AMV-564 initiation 75 mcg dose 22. 10/8 Dropped down to 50 mcg dose (I think that date is correct) 23. 10/19-21/20 Tx held due to Neutropenia 24. 12/06/18 Given Neulasta 25. 12/07/18 AMV-564 resumed 26. 01/03/19 CT C/A/P 10% RECIST progression 27. 01/15/19 Increased dose to 75 mcg/daily 28. 01/17/19 Thoracentesis with cytology c/w [place] primary  29. Paracentesis 02/05/2019: cytology c/w adenocarcinoma  (y) schedule of thoracenteses:  (a) left 01/17/2019 [facility])  (a) left 02/06/2019 [facility])  (z) schedule of paracenteses:  (a) 01/30/2019 ([place])  (b) 02/06/2019 ([place])

## 2019-02-19 NOTE — Progress Notes (Signed)
[place]  [phone] [phone]    ID: Kathryn Ramsey DOB: Jan 08, 1966  MR#: [id number]  ZDG#:[id number]  Patient Care Team: Kathryn Stains, MD as PCP - General (Family Medicine) Kathryn Amber, MD as Consulting Physician (Obstetrics and Gynecology) Kathryn Craver, MD as Consulting Physician (Gastroenterology) Kathryn Salina, MD as Consulting Physician (Obstetrics and Gynecology) Kathryn Retort, MD as Consulting Physician (Ramsey) Kathryn Ends, MD as Referring Physician (Obstetrics and Gynecology) Mettu, Kathryn Apo, MD as Referring Physician (Oncology) OTHER MD: Kathryn Ramsey [phone]) Kathryn Ramsey   CHIEF COMPLAINT:  ovarian cancer  CURRENT TREATMENT: Abraxane, bevacizumab   INTERVAL HISTORY: Kathryn Ramsey returns today for follow-up and treatment of her recurrent ovarian cancer accompanied by her husband.  She worked with Kathryn. Theora Ramsey and Kathryn. Dennison Ramsey at [facility] and was enrolled in the [id number] trial on 11/13/2018.  She tolerated treatment well, but this was discontinued on 02/02/2019 due to clinical progression (as noted in scans below).  She underwent chest/abdomen/pelvis CT on 01/03/2019 to evaluate treatment response. This unfortunately revealed: worsening peritoneal carcinomatosis with increasing ascites; new small to moderate left pleural effusion without discrete intrathoracic mass/lesion.  She proceeded to thoracentesis on 01/17/2019, with cytopathology ([id number]) confirming metastatic disease.  She also underwent paracentesis on 01/30/2019, which yielded 2.6 L of fluid.  She underwent follow up abdomen/pelvis MRI on 02/01/2019, which revealed: mild increase in size of malignant ascites and malignant left pleural effusion since prior CT on 11/18; re-demonstrated extensive peritoneal carcinomatosis.  She underwent repeat paracentesis and thoracentesis on 02/05/2019, which yielded 1.4 L of fluid. Cytopathology from the  procedure ([id number]) again showed malignant cells consistent with adenocarcinoma.  She most recently underwent repeat chest/abdomen/pelvis CT on 02/13/2019, which again revealed disease progression: interval increase in size of vaginal cuff mass, now measuring 5.5 cm (previously 4.2 cm); remaining peritoneal metastatic disease within malignant ascites remains unchanged; increase in nodularity along pleural surface on right, measuring up to 5 mm; increased size of left pleural effusion, now large with compressive atelectasis; nonspecific asymmetric lymph nodes within left axilla and subpectoral region may be reactive.   REVIEW OF SYSTEMS: Kathryn Ramsey has lost approximately 15 pounds as compared to July 2020.  Her hair is back and she is very happy with it.  She continues to be very active and she and her husband walked about 42mles 1 day over the holidays.  She has pain chiefly in the left chest wall area.  Sometimes if she has significant fluid accumulation she can also have shortness of breath.  She denies unusual headaches visual changes nausea or vomiting.  Her neuropathy particularly affects the feet and she is very careful when she walks so that she does not fall.  She does not however have the pain in the feet that she used to have previously.  It is just numbness at present.  She has some neuropathy in the hands as well but she is able to open jars and use pen or pencil without difficulty.  A detailed review of systems today was otherwise stable   HISTORY OF PRESENT ILLNESS: From Kathryn. RSerita Gritoriginal intake note 03/17/2015:  "Kathryn Mikelsis a very pleasant G4P4 who is seen in consultation at the request of Kathryn Ramsey Kathryn Ramsey peritoneal carcinomatosis. The patient has a history of a workup by Ramsey for gross hematuria which included a pelvic examine was suggested for extrinsic mass effect on the bladder on cystoscopy. Cystoscopy took place on in December 2016. The patient denies abdominal  pain bloating early satiety or abdominal distention.  On 08/03/2015 at Kathryn Ramsey she underwent a CT scan of the abdomen and pelvis as ordered by Kathryn Ramsey. This revealed a 1.4 cm low attenuation lesion on the posterior right hepatic lobe suggestive of a hemangioma. Status post hysterectomy. No ovarian masses. Moderate ascites. Omental caking seen in the lateral left abdomen and pelvis. Peritoneal nodularity in the left lateral pelvic cul-de-sac. No gross extrinsic compression on the bladder was identified on imaging.  The patient was then seen by her gastroenterologist, Kathryn. Collene Ramsey, who performed a colonoscopy which was unremarkable.  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 She was treated adjuvantly with carboplatin and paclitaxel, as detailed below. Her CA 125 dropped from 1226 on 03/20/2015 to 26.1 by 06/16/2015.  Her subsequent history is as detailed above.   PAST MEDICAL HISTORY: Past Medical History:  Diagnosis Date  . Acute sensory neuropathy 06/26/2015  . Allergy   . Anemia   . Asthma    illness induced asthma  . Blood transfusion without reported diagnosis    at age 43 years old D/T surgery to femur being crushed  . 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 Heart murmur    pt, states had a "working heart murmur"  . History of kidney stones   . Neutropenia, drug-induced ([place]) 03/10/2018  . PONV (postoperative nausea and vomiting)   . Skin cancer     PAST SURGICAL HISTORY: Past Surgical History:  Procedure Laterality Date  . 3 laporscopic proceedures      . ABDOMINAL HYSTERECTOMY     2010  . CHOLECYSTECTOMY     2010  . DILATION AND CURETTAGE OF UTERUS     1997  . IR IMAGING GUIDED PORT INSERTION  03/08/2018  . LAPAROSCOPY N/A 03/27/2015   Procedure: DIAGNOSTIC LAPAROSCOPY ;  Surgeon: Kathryn Amber, MD;  Location: [facility];  Service: Gynecology;  Laterality: N/A;  . LAPAROSCOPY N/A 02/24/2016   Procedure: LAPAROSCOPY DIAGNOSTIC WITH PERITONEAL WASHINGS AND PERITONEAL BIOPSY;  Surgeon: Kathryn Amber, MD;  Location: [facility];  Service: Gynecology;  Laterality: N/A;  . LAPAROTOMY N/A 03/27/2015   Procedure: EXPLORATORY LAPAROTOMY, BILATERAL SALPINGO OOPHORECTOMY, OMENTECTOMY, RADICAL TUMOR DEBULKING;  Surgeon: Kathryn Amber, MD;  Location: [facility];  Service: Gynecology;  Laterality: N/A;  . sinus surgery 1994      FAMILY HISTORY Family History  Problem Relation Age of Onset  . Hypertension Father   . Skin cancer Father        nonmelanoma skin cancers in his late 18s  . Non-Hodgkin's lymphoma Maternal Aunt        dx. 52s; smoker  . Stroke Maternal Grandmother   . Other Mother        benign meningioma dx. early-mid-70s; hysterectomy in her late 67s for heavy periods - still has ovaries  . Other Son        one son with pre-cancerous skin findings  . Other Daughter        cysts on ovaries and hx of heavy periods  . Other Sister  hysterectomy for cysts - still has ovaries  . Heart attack Maternal Grandfather   . Heart attack Paternal Grandfather   . Renal cancer Maternal Aunt 60       smoker  . Breast cancer Maternal Aunt 78  . Other Maternal Aunt        dx. benign brain tumor (meningioma) at 2; 2nd benign brain tumor in her late 78s; hx of radical hysterectomy at age 33  . Brain cancer Other        NOS tumor  The patient's parents are both living, in their late 42s as of January 2018. The patient has one brother, 3 sisters. There is no history of ovarian cancer in the family. One maternal aunt had breast and kidney cancer, another had  non-Hodgkin's lymphoma.   GYNECOLOGIC HISTORY:  No LMP recorded. Patient has had a hysterectomy. 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 Her husband [name] is a Engineer, maintenance (IT). Their children are 59, 3, 84 and 30 y/o as of JAN 2018. The oldest works as an Electrical engineer in the [employer] in [place]. The other 3 children live in [place], the youngest currently attending [employer]. The patient has no grandchildren. She attends [employer]   ADVANCED DIRECTIVES: In the absence of any documentation to the contrary, the patient's spouse is their HCPOA.    HEALTH MAINTENANCE: Social History   Tobacco Use  . Smoking status: Never Smoker  . Smokeless tobacco: Never Used  Substance Use Topics  . Alcohol use: Yes    Comment:  3 glasses wine or beer/week  . Drug use: No    Colonoscopy:2016  PAP: s/p hyst  Bone density:   Allergies  Allergen Reactions  . Bee Venom Shortness Of Breath    swelling  . Prochlorperazine Edisylate Anaphylaxis, Anxiety and Palpitations    Anaphylaxis, Anxiety and Palpitations   . Sulfa Antibiotics Shortness Of Breath    Vomit , diarrhea , hives  . Zarxio [Filgrastim]     Current Outpatient Medications  Medication Sig Dispense Refill  . acetaminophen (TYLENOL) 500 MG tablet Take 2 tablets by mouth 3 (three) times daily as needed.    [name] acetaminophen-codeine (TYLENOL #3) 300-30 MG tablet Take 2 tablets by mouth 4 (four) times daily as needed. 60 tablet 0  . albuterol (VENTOLIN HFA) 108 (90 Base) MCG/ACT inhaler Inhale into the lungs.    . diphenhydrAMINE (BENADRYL) 50 MG capsule 50 mg Diphenhydramine (Benadryl) PO within 1 hour of the injection 1 capsule 0  . fluticasone (FLONASE) 50 MCG/ACT nasal spray Place 1 spray into both nostrils 2 (two) times daily. (Patient taking differently: Place 1 spray into both nostrils as needed. ) 16 g 2  . gabapentin  (NEURONTIN) 100 MG capsule Take 2 capsules (200 mg total) by mouth 3 (three) times daily. 540 capsule 1  . hydrocortisone cream 1 % Apply 1 application topically 3 (three) times daily.    [name] ibuprofen (ADVIL) 600 MG tablet Take 1 tablet by mouth 4 (four) times daily as needed.    . lidocaine-prilocaine (EMLA) cream APPLY TOPICALLY AS NEEDED. 30 g 0  . loratadine (CLARITIN) 10 MG tablet Take 10 mg by mouth as needed.     . magnesium oxide (MAG-OX) 400 (241.3 Mg) MG tablet Take 1 tablet (400 mg total) by mouth daily. 60 tablet 3  . Multiple Vitamins-Minerals (MULTIVITAMIN ADULT PO)  Take 1 tablet by mouth daily.    . Omega 3 1000 MG CAPS Take 1 capsule by mouth daily.    [name] omeprazole (PRILOSEC) 40 MG capsule Take 1 capsule (40 mg total) by mouth daily. 30 capsule 5  . ondansetron (ZOFRAN) 8 MG tablet     . senna-docusate (SENOKOT-S) 8.6-50 MG tablet Take 1-2 tablets by mouth 2 (two) times daily as needed.    . sertraline (ZOLOFT) 50 MG tablet TAKE 1 TABLET ONCE DAILY. 30 tablet 3  . traMADol (ULTRAM) 50 MG tablet Take 1 tablet (50 mg total) by mouth every 6 (six) hours as needed. 60 tablet 0  . valACYclovir (VALTREX) 1000 MG tablet Take 1 tablet (1,000 mg total) by mouth daily. 5 tablet 0  . VENTOLIN HFA 108 (90 Base) MCG/ACT inhaler     . vitamin B-12 (CYANOCOBALAMIN) 1000 MCG tablet Take 1 tablet by mouth daily.     No current facility-administered medications for this visit.   Facility-Administered Medications Ordered in Other Visits  Medication Dose Route Frequency Provider Last Rate Last Admin  . 0.9 %  sodium chloride infusion   Intravenous Once Aubrey Blackard, Virgie Dad, MD      . heparin lock flush 100 unit/mL  500 Units Intravenous Once Verginia Toohey, Virgie Dad, MD      . heparin lock flush 100 unit/mL  500 Units Intracatheter Once PRN Cola Gane, Virgie Dad, MD      . heparin lock flush 100 unit/mL  500 Units Intracatheter Once PRN Shahmeer Bunn, Virgie Dad, MD      . sodium chloride flush (NS) 0.9 %  injection 10 mL  10 mL Intravenous PRN Hollie Wojahn, Virgie Dad, MD      . sodium chloride flush (NS) 0.9 % injection 10 mL  10 mL Intracatheter PRN Larrie Lucia, Virgie Dad, MD      . sodium chloride flush (NS) 0.9 % injection 10 mL  10 mL Intracatheter PRN Johnesha Acheampong, Virgie Dad, MD        OBJECTIVE: Middle-aged white woman who appears stated age Vitals:   02/20/19 1210  BP: 113/82  Pulse: 74  Resp: 16  Temp: 98.5 F (36.9 C)  SpO2: 100%     Body mass index is 20.23 kg/Kathryn.   Filed Weights   02/20/19 1210  Weight: 114 lb 3.2 oz (51.8 kg)    ECOG FS:1 - Symptomatic but completely ambulatory  Sclerae unicteric, EOMs intact Wearing a mask No cervical or supraclavicular adenopathy Lungs no rales or rhonchi Heart regular rate and rhythm Abd soft, nontender, positive bowel sounds MSK no focal spinal tenderness, no upper extremity lymphedema Neuro: nonfocal, well oriented, appropriate affect Breasts: Deferred  LAB RESULTS:  CMP     Component Value Date/Time   NA 137 02/20/2019 1230   NA 140 02/11/2017 0755   K 4.1 02/20/2019 1230   K 4.4 02/11/2017 0755   CL 102 02/20/2019 1230   CO2 24 02/20/2019 1230   CO2 26 02/11/2017 0755   GLUCOSE 91 02/20/2019 1230   GLUCOSE 93 02/11/2017 0755   BUN 13 02/20/2019 1230   BUN 7.9 02/11/2017 0755   CREATININE 0.79 02/20/2019 1230   CREATININE 1.06 (H) 06/19/2018 0931   CREATININE 0.7 02/11/2017 0755   CALCIUM 9.0 02/20/2019 1230   CALCIUM 9.6 02/11/2017 0755   PROT 7.0 02/20/2019 1230   PROT 7.5 02/11/2017 0755   ALBUMIN 3.1 (L) 02/20/2019 1230   ALBUMIN 4.4 02/11/2017 0755   AST 14 (L) 02/20/2019 1230   AST  22 06/19/2018 0931   AST 27 02/11/2017 0755   ALT 8 02/20/2019 1230   ALT 13 06/19/2018 0931   ALT 24 02/11/2017 0755   ALKPHOS 77 02/20/2019 1230   ALKPHOS 58 02/11/2017 0755   BILITOT 0.2 (L) 02/20/2019 1230   BILITOT 0.2 (L) 06/19/2018 0931   BILITOT 0.43 02/11/2017 0755   GFRNONAA >60 02/20/2019 1230   GFRNONAA 59 (L)  06/19/2018 0931   GFRAA >60 02/20/2019 1230   GFRAA >60 06/19/2018 0931    INo results found for: SPEP, UPEP  Lab Results  Component Value Date   WBC 3.7 (L) 02/20/2019   NEUTROABS 2.3 02/20/2019   HGB 8.4 (L) 02/20/2019   HCT 27.1 (L) 02/20/2019   MCV 92.2 02/20/2019   PLT 418 (H) 02/20/2019      Chemistry      Component Value Date/Time   NA 137 02/20/2019 1230   NA 140 02/11/2017 0755   K 4.1 02/20/2019 1230   K 4.4 02/11/2017 0755   CL 102 02/20/2019 1230   CO2 24 02/20/2019 1230   CO2 26 02/11/2017 0755   BUN 13 02/20/2019 1230   BUN 7.9 02/11/2017 0755   CREATININE 0.79 02/20/2019 1230   CREATININE 1.06 (H) 06/19/2018 0931   CREATININE 0.7 02/11/2017 0755      Component Value Date/Time   CALCIUM 9.0 02/20/2019 1230   CALCIUM 9.6 02/11/2017 0755   ALKPHOS 77 02/20/2019 1230   ALKPHOS 58 02/11/2017 0755   AST 14 (L) 02/20/2019 1230   AST 22 06/19/2018 0931   AST 27 02/11/2017 0755   ALT 8 02/20/2019 1230   ALT 13 06/19/2018 0931   ALT 24 02/11/2017 0755   BILITOT 0.2 (L) 02/20/2019 1230   BILITOT 0.2 (L) 06/19/2018 0931   BILITOT 0.43 02/11/2017 0755       No results found for: LABCA2  No components found for: LABCA125  No results for input(s): INR in the last 168 hours.  Urinalysis    Component Value Date/Time   COLORURINE STRAW (A) 11/22/2017 0940   APPEARANCEUR CLEAR 11/22/2017 0940   LABSPEC 1.002 (L) 11/22/2017 0940   LABSPEC 1.005 10/09/2015 1239   PHURINE 7.0 11/22/2017 0940   GLUCOSEU NEGATIVE 11/22/2017 0940   GLUCOSEU Negative 10/09/2015 1239   HGBUR NEGATIVE 11/22/2017 0940   BILIRUBINUR NEGATIVE 11/22/2017 0940   BILIRUBINUR Negative 10/09/2015 1239   KETONESUR NEGATIVE 11/22/2017 0940   PROTEINUR NEGATIVE 02/20/2019 1350   UROBILINOGEN 0.2 10/09/2015 1239   NITRITE NEGATIVE 11/22/2017 0940   LEUKOCYTESUR NEGATIVE 11/22/2017 0940   LEUKOCYTESUR Negative 10/09/2015 1239    STUDIES: Result Narrative  Procedure: CT Chest with  IV Contrast  Procedure: CT Abdomen and Pelvis with IV Contrast  Comparison: MRI from January 31, 2019  Indication: Clinical trial-research protocol, see free text, Cancer Restaging for Clinical Trial, Z00.6 Encounter for examination for normal comparison and control in clinical research program, C57.00 Malignant neoplasm of unspecified fallopian tube (CMS-HCC)  Technique: CT imaging was performed of the chest, abdomen, and pelvis following the administration of intravenous contrast. Iodinated contrast was used due to the indications for the examination, to improve disease detection and to further define anatomy.  3-D maximal intensity projection (MIP) reconstructions of the chest were performed to potentially increase study sensitivity. Coronal and sagittal images were also generated and reviewed.  Findings: Chest:  Multilevel degenerative changes of the thoracic spine. No suspicious lytic or sclerotic osseous lesions.  Asymmetric prominent left axillary and subpectoral lymph nodes  best seen on series 3 image 22 with subpectoral node measuring up to 5 mm in diameter.  Scattered nonenlarged lymph nodes are seen throughout the mediastinum. The heart is normal in size. Trace pericardial fluid. Aorta and pulmonary artery are of normal caliber.  Large left pleural effusion with compressive atelectasis. Minimal subpleural nodularity most notable on series 3 images 69 and 81 measuring up to 5 mm in diameter.  Abdomen and pelvis:  Multilevel degenerative changes of lumbosacral spine. No suspicious lytic or sclerotic osseous lesions.  Right hepatic lobe hemangioma remains unchanged. Gallbladder surgically absent. No dilatation the biliary tree.  Spleen, adrenal glands and pancreas demonstrate no significant abnormality.  Symmetric cortical enhancement the kidneys. Subcentimeter hypoattenuating renal lesions too small to characterize. No hydronephrosis. No  hydroureter. Bladder is partially distended.  Uterus surgically absent however at the level of the vaginal cough there is note again made of a enlarging heterogeneous enhancing soft tissue mass now measuring up to 5.5 x 3.8 cm, previously 4.2 x 2.4 cm with abutment/loss of fat plane at the anterior rectum also best seen on series 3 image 180.  Redemonstration of small volume ascites with extensive peritoneal thickening. Widespread peritoneal metastasis is again noted. For reference gastrosplenic lesion seen on series 3 image 111 now measures 2 cm in diameter, previously 2.1 cm. Right upper quadrant lesion abutting the right inferior hepatic lobe series 2 image 126 measuring up to 2.1 cm, previously 2 cm.  Vasculature is patent.  Cardiophrenic lymph nodes series 3 image 83 has increased in size now measuring up to 0.8 cm.  Impression: 1. Interval increase in size of mass centered in the vaginal cuff now measuring up to 5.5 cm previously 4.2 cm. Remaining peritoneal metastatic disease within malignant ascites remains unchanged.  2. Increase in nodularity the along the pleural surface on the right measuring up to 5 mm in diameter. Recommend attention on follow-up imaging.  3. Increased in size of left pleural effusion now large with compressive atelectasis.  4. Nonspecific asymmetric lymph nodes within the left axilla and subpectoral region may be reactive.  Electronically Reviewed by: Nehemiah Massed, MD, [facility] Electronically Reviewed on: 02/13/2019 9:54 AM  I have reviewed the images and concur with the above findings.  Electronically Signed by: Art Buff, MD, [facility] Electronically Signed on: 02/13/2019 12:25 PM  Other Result Information  Interface, Rad Results In - 02/13/2019 12:26 PM EST Procedure: CT Chest with IV Contrast  Procedure: CT Abdomen and Pelvis with IV Contrast  Comparison:  MRI from January 31, 2019  Indication:  Clinical  trial-research protocol, see free text, Cancer Restaging for Clinical Trial, Z00.6 Encounter for examination for normal comparison and control in clinical research program, C57.00 Malignant neoplasm of unspecified fallopian tube (CMS-HCC)  Technique:  CT imaging was performed of the chest, abdomen, and pelvis following the administration of intravenous contrast.  Iodinated contrast was used due to the indications for the examination, to improve disease detection and to further define anatomy.   3-D maximal intensity projection (MIP) reconstructions of the chest were performed to potentially increase study sensitivity. Coronal and sagittal images were also generated and reviewed.  Findings: Chest:   Multilevel degenerative changes of the thoracic spine. No suspicious lytic or sclerotic osseous lesions.  Asymmetric prominent left axillary and subpectoral lymph nodes best seen on series 3 image 22 with subpectoral node measuring up to 5 mm in diameter.  Scattered nonenlarged lymph nodes are seen throughout the mediastinum. The heart is normal in  size. Trace pericardial fluid. Aorta and pulmonary artery are of normal caliber.  Large left pleural effusion with compressive atelectasis. Minimal subpleural nodularity most notable on series 3 images 69 and 81 measuring up to 5 mm in diameter.  Abdomen and pelvis:  Multilevel degenerative changes of lumbosacral spine. No suspicious lytic or sclerotic osseous lesions.  Right hepatic lobe hemangioma remains unchanged. Gallbladder surgically absent. No dilatation the biliary tree.  Spleen, adrenal glands and pancreas demonstrate no significant abnormality.  Symmetric cortical enhancement the kidneys. Subcentimeter hypoattenuating renal lesions too small to characterize. No hydronephrosis. No hydroureter. Bladder is partially distended.  Uterus surgically absent however at the level of the vaginal cough there is note again made of a  enlarging heterogeneous enhancing soft tissue mass now measuring up to 5.5 x 3.8 cm, previously 4.2 x 2.4 cm with abutment/loss of fat plane at the anterior rectum also best seen on series 3 image 180.  Redemonstration of small volume ascites with extensive peritoneal thickening. Widespread peritoneal metastasis is again noted. For reference gastrosplenic lesion seen on series 3 image 111 now measures 2 cm in diameter, previously 2.1 cm. Right upper quadrant lesion abutting the right inferior hepatic lobe series 2 image 126 measuring up to 2.1 cm, previously 2 cm.  Vasculature is patent.  Cardiophrenic lymph nodes series 3 image 83 has increased in size now measuring up to 0.8 cm.  Impression: 1. Interval increase in size of mass centered in the vaginal cuff now measuring up to 5.5 cm previously 4.2 cm. Remaining peritoneal metastatic disease within malignant ascites remains unchanged.  2. Increase in nodularity the along the pleural surface on the right measuring up to 5 mm in diameter. Recommend attention on follow-up imaging.  3. Increased in size of left pleural effusion now large with compressive atelectasis.  4. Nonspecific asymmetric lymph nodes within the left axilla and subpectoral region may be reactive.  Electronically Reviewed by:  Nehemiah Massed, MD, [facility] Electronically Reviewed on:  02/13/2019 9:54 AM  I have reviewed the images and concur with the above findings.  Electronically Signed by:  Art Buff, MD, [facility] Electronically Signed on:  02/13/2019 12:25 PM  Status Results Details       ELIGIBLE FOR AVAILABLE RESEARCH PROTOCOL:  no  ASSESSMENT: 54 y.o. BRCA negative [place] woman status post radical tumor debulking with optimal cytoreduction (R0) 03/27/2015 for a stage IIIC, high-grade right fallopian tube carcinoma  (a) baseline CA-125 was 1226.  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  (c.[id number]>G (p.[id number])  (c) tumor is PD-L1 negative (02/24/2016)  (d) tumor is strongly estrogen receptor positive, progesterone receptor negative (02/24/2016)  (1) adjuvant chemotherapy consisted of carboplatin and paclitaxel for 6 doses, begun 04/14/2015, completed 08/11/2015  (a) paclitaxel was omitted from cycle 5 and dose reduced on cycle 6 because of neuropathy   (b) a "make up" dose of paclitaxel was given 08/25/2015  (c) last carboplatin dose was 08/11/2015  (d) CA-125 normalized by 06/16/2015   (2) FIRST RECURRENCE: January 2018  (a) CA-125 rise beginning November 2017 led to CT scans and PET scans December 2017, all negative  (b) exploratory laparotomy 02/24/2016 showed pathologically confirmed recurrence, with miliary disease involving all examined surfaces  (c) port-site metastases noted 03/08/2016  (d) the January 2018 tumor sample was tested for the estrogen receptor and he was 80 percent positive with strong staining,  progesterone receptor negative  (3) carboplatin/liposomal doxorubicin started 03/05/2016, repeated every three weeks x4, completed 06/04/2016.  (a) cycle 3 delayed one week because of neutropenia; OnPro added  (b) CA 125 normalized after cycle 3  (4) RUCAPARIB maintenance started 07/02/2016  (a) restaging 10/01/2016: Normal CA 125, negative CT of the abdomen and pelvis  (b) labs 11/24/2016 shows a rise in her CA 125-43.4, with continuing rise thereafter  (c) rucaparib discontinued October 2018 with rising CA 125  (5) anastrozole started December 23, 2016-stopped after a couple of weeks with poor tolerance  SECOND RECURRENCE: (6) Gemcitabine/Bevacizumab started on 02/04/2017  (a) gemcitabine omitted cycle 4 because of intercurrent infection  (b) gemcitabine resumed with cycle 5, with intervening rise in  Ca 125  (c) repeat CT scan of the abdomen and  pelvis on 05/26/2017 does not confirm obvious disease progression  (d) cycle 5 of gemcitabine/bevacizumab delayed 1 week   (e) gemcitabine/ bevacizumab discontinued after 6 cycles, with rising CA 125 (last dose 06/24/2017)  (7) foundation one testing did not show a high mutation burden and the microsatellite status was stable.  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  These were not immediately actionable  (8) Abraxane started 07/12/2017, given weekly or weeks 1 and 2 of each 3 weeks cycle, last dose 11/15/2017 (5 cycles)  (a) cycle 2 started 08/09/2017, will be day 1 day 8 only  (b) cycle 3 scheduled to start 09/13/2017, day 1 day 8 at patient request  (c) CT of the abdomen and pelvis 11/28/2017 shows no measurable disease  (d) PET scan 01/25/2018 and MRI of the pelvis 02/03/2018 show no measurable disease  (e) patient has symptomatic ascites and a rapidly rising Ca1 25  (9) started cisplatin/gemcitabine days 1 and 8 of each 21-day cycle on 02/18/2018  (a) switched to carboplatin/ gemcitabine 07/17/2018 due to neuropathy  (b) carbo/gemzar changed to Q14 days 07/24/2018 due to neutropenia  (c) considered niraparib/Zejula 100 mg, 2 tabs daily at the completion of chemotherapy  (10) associated problems  (a) 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 10/25/18 Consenting for AMV-564 clinical trial 21. 11/13/18 AMV-564 initiation 75 mcg dose 22. 10/8 Dropped down to 50 mcg dose (I think that date is correct) 23. 10/19-21/20 Tx held due to Neutropenia 24. 12/06/18 Given Neulasta 25. 12/07/18 AMV-564 resumed 26. 01/03/19 CT C/A/P 10% RECIST progression 27. 01/15/19 Increased dose to 75 mcg/daily 28. 01/17/19 Thoracentesis with cytology c/w Mullerian primary  29. 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  We reviewed her experience at [facility] which was generally well.  She tolerated the study drug without difficulty but unfortunately she had documented disease progression as noted on the CT pasted above, which will be are new baseline.  We discussed several issues.  First as she is requiring repeated thoracentesis and paracentesis.  I suggested she consider a Pleurx for the paracentesis.  She could easily drain ascites at home and that might actually also lower the need for thoracenteses.  She will consider this.  We discussed pain issues.  I suggested that  she take Tylenol 500 mg and either Aleve or Motrin 200 mg together 3 times a day for her baseline pain control.  If she has any breakthrough pain she will take tramadol as needed.    She understands she will be constipated due to the tramadol and needs to be on a bowel prophylaxis.  Currently she is using stool softeners and occasionally Senokot.  She needs to have a soft bowel movement every 2 or 3 days at the very least and if she is not accomplishing that she needs to let us know so we can suggest additional interventions  We reviewed advanced directives.  Of course her husband who was present today will be her healthcare power of attorney.  It is appropriate for her to consider a living will at present and we went over that in detail today.  She has the appropriate documents to complete and notarized at her discretion.  If she does complete then I will add them to her chart.  We then turned to the upcoming chemotherapy.  She understands that Taxol can worsen her already present peripheral neuropathy.  She also was alerted regarding problems with proteinuria blood  pressure and bleeding from the bevacizumab.  She will let me know if those or any other symptoms of concern develop before she returns next week.  When she sees me next week we can decide if she wants to be treated weekly x2 with a break, 3 times in a row with a break, or every 2 weeks.     Kathryn Ramsey, Virgie Dad, MD  02/20/19 5:21 PM Medical Oncology and Hematology [facility] [place], [place] [address] Tel. [phone]    Fax. [phone]   I, Wilburn Mylar, am acting as scribe for Kathryn. Virgie Dad. Sydell Prowell.  I, Lurline Del MD, have reviewed the above documentation for accuracy and completeness, and I agree with the above.

## 2019-02-20 ENCOUNTER — Inpatient Hospital Stay (HOSPITAL_BASED_OUTPATIENT_CLINIC_OR_DEPARTMENT_OTHER): Payer: 59 | Admitting: Oncology

## 2019-02-20 ENCOUNTER — Inpatient Hospital Stay: Payer: 59 | Admitting: Oncology

## 2019-02-20 ENCOUNTER — Telehealth: Payer: Self-pay | Admitting: Oncology

## 2019-02-20 ENCOUNTER — Inpatient Hospital Stay: Payer: 59 | Attending: Oncology

## 2019-02-20 ENCOUNTER — Inpatient Hospital Stay: Payer: 59

## 2019-02-20 ENCOUNTER — Other Ambulatory Visit: Payer: Self-pay | Admitting: Oncology

## 2019-02-20 ENCOUNTER — Other Ambulatory Visit: Payer: Self-pay

## 2019-02-20 VITALS — BP 113/82 | HR 74 | Temp 98.5°F | Resp 16 | Ht 63.0 in | Wt 114.2 lb

## 2019-02-20 VITALS — BP 110/64 | HR 69 | Temp 98.2°F | Resp 18

## 2019-02-20 DIAGNOSIS — N6489 Other specified disorders of breast: Secondary | ICD-10-CM | POA: Insufficient documentation

## 2019-02-20 DIAGNOSIS — J90 Pleural effusion, not elsewhere classified: Secondary | ICD-10-CM | POA: Diagnosis not present

## 2019-02-20 DIAGNOSIS — Z79899 Other long term (current) drug therapy: Secondary | ICD-10-CM | POA: Insufficient documentation

## 2019-02-20 DIAGNOSIS — C562 Malignant neoplasm of left ovary: Secondary | ICD-10-CM | POA: Diagnosis not present

## 2019-02-20 DIAGNOSIS — Z87442 Personal history of urinary calculi: Secondary | ICD-10-CM | POA: Insufficient documentation

## 2019-02-20 DIAGNOSIS — Z85828 Personal history of other malignant neoplasm of skin: Secondary | ICD-10-CM | POA: Insufficient documentation

## 2019-02-20 DIAGNOSIS — T451X5A Adverse effect of antineoplastic and immunosuppressive drugs, initial encounter: Secondary | ICD-10-CM | POA: Insufficient documentation

## 2019-02-20 DIAGNOSIS — Z8051 Family history of malignant neoplasm of kidney: Secondary | ICD-10-CM | POA: Insufficient documentation

## 2019-02-20 DIAGNOSIS — C762 Malignant neoplasm of abdomen: Secondary | ICD-10-CM | POA: Diagnosis not present

## 2019-02-20 DIAGNOSIS — D6481 Anemia due to antineoplastic chemotherapy: Secondary | ICD-10-CM | POA: Diagnosis not present

## 2019-02-20 DIAGNOSIS — Z803 Family history of malignant neoplasm of breast: Secondary | ICD-10-CM | POA: Insufficient documentation

## 2019-02-20 DIAGNOSIS — C786 Secondary malignant neoplasm of retroperitoneum and peritoneum: Secondary | ICD-10-CM

## 2019-02-20 DIAGNOSIS — Z7189 Other specified counseling: Secondary | ICD-10-CM

## 2019-02-20 DIAGNOSIS — D701 Agranulocytosis secondary to cancer chemotherapy: Secondary | ICD-10-CM | POA: Diagnosis not present

## 2019-02-20 DIAGNOSIS — Z5111 Encounter for antineoplastic chemotherapy: Secondary | ICD-10-CM | POA: Diagnosis present

## 2019-02-20 DIAGNOSIS — Z170 Estrogen receptor positive status [ER+]: Secondary | ICD-10-CM | POA: Diagnosis not present

## 2019-02-20 DIAGNOSIS — Z8249 Family history of ischemic heart disease and other diseases of the circulatory system: Secondary | ICD-10-CM | POA: Diagnosis not present

## 2019-02-20 DIAGNOSIS — D709 Neutropenia, unspecified: Secondary | ICD-10-CM | POA: Insufficient documentation

## 2019-02-20 DIAGNOSIS — G620 Drug-induced polyneuropathy: Secondary | ICD-10-CM | POA: Diagnosis not present

## 2019-02-20 DIAGNOSIS — Z9079 Acquired absence of other genital organ(s): Secondary | ICD-10-CM | POA: Diagnosis not present

## 2019-02-20 DIAGNOSIS — Z7289 Other problems related to lifestyle: Secondary | ICD-10-CM | POA: Insufficient documentation

## 2019-02-20 DIAGNOSIS — Z882 Allergy status to sulfonamides status: Secondary | ICD-10-CM | POA: Diagnosis not present

## 2019-02-20 DIAGNOSIS — Z5112 Encounter for antineoplastic immunotherapy: Secondary | ICD-10-CM | POA: Diagnosis present

## 2019-02-20 DIAGNOSIS — R519 Headache, unspecified: Secondary | ICD-10-CM | POA: Diagnosis not present

## 2019-02-20 DIAGNOSIS — K5900 Constipation, unspecified: Secondary | ICD-10-CM | POA: Insufficient documentation

## 2019-02-20 DIAGNOSIS — Z90722 Acquired absence of ovaries, bilateral: Secondary | ICD-10-CM | POA: Diagnosis not present

## 2019-02-20 DIAGNOSIS — C801 Malignant (primary) neoplasm, unspecified: Secondary | ICD-10-CM

## 2019-02-20 DIAGNOSIS — Z823 Family history of stroke: Secondary | ICD-10-CM | POA: Insufficient documentation

## 2019-02-20 DIAGNOSIS — G629 Polyneuropathy, unspecified: Secondary | ICD-10-CM | POA: Diagnosis not present

## 2019-02-20 DIAGNOSIS — R180 Malignant ascites: Secondary | ICD-10-CM | POA: Insufficient documentation

## 2019-02-20 DIAGNOSIS — C5701 Malignant neoplasm of right fallopian tube: Secondary | ICD-10-CM | POA: Insufficient documentation

## 2019-02-20 DIAGNOSIS — Z808 Family history of malignant neoplasm of other organs or systems: Secondary | ICD-10-CM | POA: Insufficient documentation

## 2019-02-20 LAB — COMPREHENSIVE METABOLIC PANEL
ALT: 8 U/L (ref 0–44)
AST: 14 U/L — ABNORMAL LOW (ref 15–41)
Albumin: 3.1 g/dL — ABNORMAL LOW (ref 3.5–5.0)
Alkaline Phosphatase: 77 U/L (ref 38–126)
Anion gap: 11 (ref 5–15)
BUN: 13 mg/dL (ref 6–20)
CO2: 24 mmol/L (ref 22–32)
Calcium: 9 mg/dL (ref 8.9–10.3)
Chloride: 102 mmol/L (ref 98–111)
Creatinine, Ser: 0.79 mg/dL (ref 0.44–1.00)
GFR calc Af Amer: >60 mL/min (ref >60)
GFR calc non Af Amer: >60 mL/min (ref >60)
Glucose, Bld: 91 mg/dL (ref 70–99)
Potassium: 4.1 mmol/L (ref 3.5–5.1)
Sodium: 137 mmol/L (ref 135–145)
Total Bilirubin: 0.2 mg/dL — ABNORMAL LOW (ref 0.3–1.2)
Total Protein: 7 g/dL (ref 6.5–8.1)

## 2019-02-20 LAB — RETICULOCYTES
Immature Retic Fract: 14.2 % (ref 2.3–15.9)
RBC.: 2.93 MIL/uL — ABNORMAL LOW (ref 3.87–5.11)
Retic Count, Absolute: 34.9 10*3/uL (ref 19.0–186.0)
Retic Ct Pct: 1.2 % (ref 0.4–3.1)

## 2019-02-20 LAB — CBC WITH DIFFERENTIAL/PLATELET
Abs Immature Granulocytes: 0.01 10*3/uL (ref 0.00–0.07)
Basophils Absolute: 0 10*3/uL (ref 0.0–0.1)
Basophils Relative: 1 %
Eosinophils Absolute: 0 10*3/uL (ref 0.0–0.5)
Eosinophils Relative: 1 %
HCT: 27.1 % — ABNORMAL LOW (ref 36.0–46.0)
Hemoglobin: 8.4 g/dL — ABNORMAL LOW (ref 12.0–15.0)
Immature Granulocytes: 0 %
Lymphocytes Relative: 29 %
Lymphs Abs: 1.1 10*3/uL (ref 0.7–4.0)
MCH: 28.6 pg (ref 26.0–34.0)
MCHC: 31 g/dL (ref 30.0–36.0)
MCV: 92.2 fL (ref 80.0–100.0)
Monocytes Absolute: 0.3 10*3/uL (ref 0.1–1.0)
Monocytes Relative: 7 %
Neutro Abs: 2.3 10*3/uL (ref 1.7–7.7)
Neutrophils Relative %: 62 %
Platelets: 418 10*3/uL — ABNORMAL HIGH (ref 150–400)
RBC: 2.94 MIL/uL — ABNORMAL LOW (ref 3.87–5.11)
RDW: 15.2 % (ref 11.5–15.5)
WBC: 3.7 10*3/uL — ABNORMAL LOW (ref 4.0–10.5)
nRBC: 0 % (ref 0.0–0.2)

## 2019-02-20 LAB — FERRITIN: Ferritin: 312 ng/mL — ABNORMAL HIGH (ref 11–307)

## 2019-02-20 LAB — TOTAL PROTEIN, URINE DIPSTICK: Protein, ur: NEGATIVE mg/dL

## 2019-02-20 MED ORDER — ONDANSETRON HCL 8 MG PO TABS
ORAL_TABLET | ORAL | Status: AC
  Filled 2019-02-20: qty 1

## 2019-02-20 MED ORDER — HEPARIN SOD (PORK) LOCK FLUSH 100 UNIT/ML IV SOLN
500.0000 [IU] | Freq: Once | INTRAVENOUS | Status: AC | PRN
  Administered 2019-02-20: 500 [IU]
  Filled 2019-02-20: qty 5

## 2019-02-20 MED ORDER — SODIUM CHLORIDE 0.9 % IV SOLN
Freq: Once | INTRAVENOUS | Status: AC
  Filled 2019-02-20: qty 250

## 2019-02-20 MED ORDER — SODIUM CHLORIDE 0.9% FLUSH
10.0000 mL | INTRAVENOUS | Status: DC | PRN
  Filled 2019-02-20: qty 10

## 2019-02-20 MED ORDER — ONDANSETRON HCL 8 MG PO TABS
8.0000 mg | ORAL_TABLET | Freq: Once | ORAL | Status: AC
  Administered 2019-02-20: 16:00:00 8 mg via ORAL

## 2019-02-20 MED ORDER — PROCHLORPERAZINE MALEATE 10 MG PO TABS: 10.0000 mg | ORAL_TABLET | Freq: Once | ORAL | Status: DC

## 2019-02-20 MED ORDER — SODIUM CHLORIDE 0.9 % IV SOLN
15.0000 mg/kg | Freq: Once | INTRAVENOUS | Status: AC
  Administered 2019-02-20: 15:00:00 800 mg via INTRAVENOUS
  Filled 2019-02-20: qty 32

## 2019-02-20 MED ORDER — SODIUM CHLORIDE 0.9% FLUSH
10.0000 mL | INTRAVENOUS | Status: DC | PRN
  Administered 2019-02-20: 17:00:00 10 mL
  Filled 2019-02-20: qty 10

## 2019-02-20 MED ORDER — SODIUM CHLORIDE 0.9 % IV SOLN
Freq: Once | INTRAVENOUS | Status: DC
  Filled 2019-02-20: qty 250

## 2019-02-20 MED ORDER — HEPARIN SOD (PORK) LOCK FLUSH 100 UNIT/ML IV SOLN
500.0000 [IU] | Freq: Once | INTRAVENOUS | Status: DC | PRN
  Filled 2019-02-20: qty 5

## 2019-02-20 MED ORDER — PACLITAXEL PROTEIN-BOUND CHEMO INJECTION 100 MG
100.0000 mg/m2 | Freq: Once | INTRAVENOUS | Status: AC
  Administered 2019-02-20: 150 mg via INTRAVENOUS
  Filled 2019-02-20: qty 30

## 2019-02-20 NOTE — Telephone Encounter (Signed)
Returned patient's phone call regarding cancelling 01/07 appointment, per patient's request appointment has been cancelled due to patient receiving the same treatment today.  Message to provider.

## 2019-02-20 NOTE — Patient Instructions (Signed)
Bevacizumab injection What is this medicine? BEVACIZUMAB (be va SIZ yoo mab) is a monoclonal antibody. It is used to treat many types of cancer. This medicine may be used for other purposes; ask your health care provider or pharmacist if you have questions. COMMON BRAND NAME(S): Avastin, MVASI, Zirabev What should I tell my health care provider before I take this medicine? They need to know if you have any of these conditions:  diabetes  heart disease  high blood pressure  history of coughing up blood  prior anthracycline chemotherapy (e.g., doxorubicin, daunorubicin, epirubicin)  recent or ongoing radiation therapy  recent or planning to have surgery  stroke  an unusual or allergic reaction to bevacizumab, hamster proteins, mouse proteins, other medicines, foods, dyes, or preservatives  pregnant or trying to get pregnant  breast-feeding How should I use this medicine? This medicine is for infusion into a vein. It is given by a health care professional in a hospital or clinic setting. Talk to your pediatrician regarding the use of this medicine in children. Special care may be needed. Overdosage: If you think you have taken too much of this medicine contact a poison control center or emergency room at once. NOTE: This medicine is only for you. Do not share this medicine with others. What if I miss a dose? It is important not to miss your dose. Call your doctor or health care professional if you are unable to keep an appointment. What may interact with this medicine? Interactions are not expected. This list may not describe all possible interactions. Give your health care provider a list of all the medicines, herbs, non-prescription drugs, or dietary supplements you use. Also tell them if you smoke, drink alcohol, or use illegal drugs. Some items may interact with your medicine. What should I watch for while using this medicine? Your condition will be monitored carefully while  you are receiving this medicine. You will need important blood work and urine testing done while you are taking this medicine. This medicine may increase your risk to bruise or bleed. Call your doctor or health care professional if you notice any unusual bleeding. Before having surgery, talk to your health care provider to make sure it is ok. This drug can increase the risk of poor healing of your surgical site or wound. You will need to stop this drug for 28 days before surgery. After surgery, wait at least 28 days before restarting this drug. Make sure the surgical site or wound is healed enough before restarting this drug. Talk to your health care provider if questions. Do not become pregnant while taking this medicine or for 6 months after stopping it. Women should inform their doctor if they wish to become pregnant or think they might be pregnant. There is a potential for serious side effects to an unborn child. Talk to your health care professional or pharmacist for more information. Do not breast-feed an infant while taking this medicine and for 6 months after the last dose. This medicine has caused ovarian failure in some women. This medicine may interfere with the ability to have a child. You should talk to your doctor or health care professional if you are concerned about your fertility. What side effects may I notice from receiving this medicine? Side effects that you should report to your doctor or health care professional as soon as possible:  allergic reactions like skin rash, itching or hives, swelling of the face, lips, or tongue  chest pain or chest tightness    chills  coughing up blood  high fever  seizures  severe constipation  signs and symptoms of bleeding such as bloody or black, tarry stools; red or dark-brown urine; spitting up blood or brown material that looks like coffee grounds; red spots on the skin; unusual bruising or bleeding from the eye, gums, or nose  signs  and symptoms of a blood clot such as breathing problems; chest pain; severe, sudden headache; pain, swelling, warmth in the leg  signs and symptoms of a stroke like changes in vision; confusion; trouble speaking or understanding; severe headaches; sudden numbness or weakness of the face, arm or leg; trouble walking; dizziness; loss of balance or coordination  stomach pain  sweating  swelling of legs or ankles  vomiting  weight gain Side effects that usually do not require medical attention (report to your doctor or health care professional if they continue or are bothersome):  back pain  changes in taste  decreased appetite  dry skin  nausea  tiredness This list may not describe all possible side effects. Call your doctor for medical advice about side effects. You may report side effects to FDA at 1-800-FDA-1088. Where should I keep my medicine? This drug is given in a hospital or clinic and will not be stored at home. NOTE: This sheet is a summary. It may not cover all possible information. If you have questions about this medicine, talk to your doctor, pharmacist, or health care provider.  2020 Elsevier/Gold Standard (2018-11-29 10:50:46)  Nanoparticle Albumin-Bound Paclitaxel injection What is this medicine? NANOPARTICLE ALBUMIN-BOUND PACLITAXEL (Na no PAHR ti kuhl al BYOO muhn-bound PAK li TAX el) is a chemotherapy drug. It targets fast dividing cells, like cancer cells, and causes these cells to die. This medicine is used to treat advanced breast cancer, lung cancer, and pancreatic cancer. This medicine may be used for other purposes; ask your health care provider or pharmacist if you have questions. COMMON BRAND NAME(S): Abraxane What should I tell my health care provider before I take this medicine? They need to know if you have any of these conditions:  kidney disease  liver disease  low blood counts, like low white cell, platelet, or red cell counts  lung or  breathing disease, like asthma  tingling of the fingers or toes, or other nerve disorder  an unusual or allergic reaction to paclitaxel, albumin, other chemotherapy, other medicines, foods, dyes, or preservatives  pregnant or trying to get pregnant  breast-feeding How should I use this medicine? This drug is given as an infusion into a vein. It is administered in a hospital or clinic by a specially trained health care professional. Talk to your pediatrician regarding the use of this medicine in children. Special care may be needed. Overdosage: If you think you have taken too much of this medicine contact a poison control center or emergency room at once. NOTE: This medicine is only for you. Do not share this medicine with others. What if I miss a dose? It is important not to miss your dose. Call your doctor or health care professional if you are unable to keep an appointment. What may interact with this medicine? This medicine may interact with the following medications:  antiviral medicines for hepatitis, HIV or AIDS  certain antibiotics like erythromycin and clarithromycin  certain medicines for fungal infections like ketoconazole and itraconazole  certain medicines for seizures like carbamazepine, phenobarbital, phenytoin  gemfibrozil  nefazodone  rifampin  St. John's wort This list may not describe all  possible interactions. Give your health care provider a list of all the medicines, herbs, non-prescription drugs, or dietary supplements you use. Also tell them if you smoke, drink alcohol, or use illegal drugs. Some items may interact with your medicine. What should I watch for while using this medicine? Your condition will be monitored carefully while you are receiving this medicine. You will need important blood work done while you are taking this medicine. This medicine can cause serious allergic reactions. If you experience allergic reactions like skin rash, itching or  hives, swelling of the face, lips, or tongue, tell your doctor or health care professional right away. In some cases, you may be given additional medicines to help with side effects. Follow all directions for their use. This drug may make you feel generally unwell. This is not uncommon, as chemotherapy can affect healthy cells as well as cancer cells. Report any side effects. Continue your course of treatment even though you feel ill unless your doctor tells you to stop. Call your doctor or health care professional for advice if you get a fever, chills or sore throat, or other symptoms of a cold or flu. Do not treat yourself. This drug decreases your body's ability to fight infections. Try to avoid being around people who are sick. This medicine may increase your risk to bruise or bleed. Call your doctor or health care professional if you notice any unusual bleeding. Be careful brushing and flossing your teeth or using a toothpick because you may get an infection or bleed more easily. If you have any dental work done, tell your dentist you are receiving this medicine. Avoid taking products that contain aspirin, acetaminophen, ibuprofen, naproxen, or ketoprofen unless instructed by your doctor. These medicines may hide a fever. Do not become pregnant while taking this medicine or for 6 months after stopping it. Women should inform their doctor if they wish to become pregnant or think they might be pregnant. Men should not father a child while taking this medicine or for 3 months after stopping it. There is a potential for serious side effects to an unborn child. Talk to your health care professional or pharmacist for more information. Do not breast-feed an infant while taking this medicine or for 2 weeks after stopping it. This medicine may interfere with the ability to get pregnant or to father a child. You should talk to your doctor or health care professional if you are concerned about your  fertility. What side effects may I notice from receiving this medicine? Side effects that you should report to your doctor or health care professional as soon as possible:  allergic reactions like skin rash, itching or hives, swelling of the face, lips, or tongue  breathing problems  changes in vision  fast, irregular heartbeat  low blood pressure  mouth sores  pain, tingling, numbness in the hands or feet  signs of decreased platelets or bleeding - bruising, pinpoint red spots on the skin, black, tarry stools, blood in the urine  signs of decreased red blood cells - unusually weak or tired, feeling faint or lightheaded, falls  signs of infection - fever or chills, cough, sore throat, pain or difficulty passing urine  signs and symptoms of liver injury like dark yellow or brown urine; general ill feeling or flu-like symptoms; light-colored stools; loss of appetite; nausea; right upper belly pain; unusually weak or tired; yellowing of the eyes or skin  swelling of the ankles, feet, hands  unusually slow heartbeat Side effects  that usually do not require medical attention (report to your doctor or health care professional if they continue or are bothersome):  diarrhea  hair loss  loss of appetite  nausea, vomiting  tiredness This list may not describe all possible side effects. Call your doctor for medical advice about side effects. You may report side effects to FDA at 1-800-FDA-1088. Where should I keep my medicine? This drug is given in a hospital or clinic and will not be stored at home. NOTE: This sheet is a summary. It may not cover all possible information. If you have questions about this medicine, talk to your doctor, pharmacist, or health care provider.  2020 Elsevier/Gold Standard (2016-10-05 13:03:45)

## 2019-02-20 NOTE — Progress Notes (Signed)
Okay to proceed today with abraxane/bevacizumab in setting of pending PA per Venora Maples. Auth status will be followed up.   Aranesp orders need to be changed to retacrit for insurance purposes. Dr. Jana Hakim said he may restart that next week. Please follow up plan.  Demetrius Charity, PharmD, [facility] Phone: [phone] 02/20/2019

## 2019-02-21 LAB — CA 125: Cancer Antigen (CA) 125: 5200 U/mL — ABNORMAL HIGH (ref 0.0–38.1)

## 2019-02-22 ENCOUNTER — Telehealth: Payer: Self-pay | Admitting: Oncology

## 2019-02-22 ENCOUNTER — Inpatient Hospital Stay: Payer: 59

## 2019-02-22 NOTE — Telephone Encounter (Signed)
I talk with patient regarding schedule  

## 2019-02-26 NOTE — Progress Notes (Signed)
[place]  [phone] [phone]    ID: Kathryn Ramsey DOB: 21-Mar-1965  MR#: [id number]  JSH#:[id number]  Patient Care Team: Kathryn Stains, MD as PCP - General (Family Medicine) Kathryn Amber, MD as Consulting Physician (Obstetrics and Gynecology) Kathryn Craver, MD as Consulting Physician (Gastroenterology) Kathryn Salina, MD as Consulting Physician (Obstetrics and Gynecology) Kathryn Retort, MD as Consulting Physician (Urology) Kathryn Ends, MD as Referring Physician (Obstetrics and Gynecology) Kathryn Ramsey, Kathryn Apo, MD as Referring Physician (Oncology) OTHER MD: Kathryn Ramsey [phone]) Kathryn Ramsey  NOTE: Patient does not want to be informed of her CA-125.  CHIEF COMPLAINT:  ovarian cancer  CURRENT TREATMENT: Abraxane, bevacizumab   INTERVAL HISTORY: Kathryn Ramsey returns today for follow-up and treatment of her recurrent ovarian cancer accompanied by her husband.  She was started on abraxane and bevacizumab last week on 02/20/2019.  She will receive Abraxane alone today.  We are following her CA-125. Results for Kathryn Ramsey, Kathryn Ramsey (MRN [id number]) as of 02/27/2019 09:58  Ref. Range 07/24/2018 08:55 08/21/2018 09:09 09/11/2018 12:23 09/25/2018 11:01 02/20/2019 12:30  Cancer Antigen (CA) 125 Latest Ref Range: 0.0 - 38.1 U/mL 115.0 (H) 147.0 (H) 258.0 (H) 373.0 (H) 5,200.0 (H)   REVIEW OF SYSTEMS: Kathryn Ramsey tolerated generally well but on day 2 she was very fatigued.  She did not have fever nausea or pain, she does decline around the house and did not do very much.  By day 3 she was feeling better ready.  She tells me the pain in her left abdomen is decreased.  She is having regular bowel movements about every 2 days but she has to manipulate herself sometimes.  Peripheral neuropathy is bothersome but not worse than before.  She has significant tingling in her hands or feet.  She is interested in trying acupuncture for this.  she takes gabapentin  in the afternoon and evening.  She got herself a cheap version of the cold cap.  She is having restless legs.  She does yoga for that. Aside from these issues a detailed review of systems today was stable   HISTORY OF PRESENT ILLNESS: From Kathryn. Serita Ramsey original intake note 03/17/2015:  "Kathryn Ramsey is a very pleasant G4P4 who is seen in consultation at the request of Kathryn Ramsey and Kathryn Ramsey for peritoneal carcinomatosis. The patient has a history of a workup by urology for gross hematuria which included a pelvic examine was suggested for extrinsic mass effect on the bladder on cystoscopy. Cystoscopy took place on in December 2016. The patient denies abdominal pain bloating early satiety or abdominal distention.  On 08/03/2015 at [facility] she underwent a CT scan of the abdomen and pelvis as ordered by Kathryn Ramsey. This revealed a 1.4 cm low attenuation lesion on the posterior right hepatic lobe suggestive of a hemangioma. Status post hysterectomy. No ovarian masses. Moderate ascites. Omental caking seen in the lateral left abdomen and pelvis. Peritoneal nodularity in the left lateral pelvic cul-de-sac. No gross extrinsic compression on the bladder was identified on imaging.  The patient was then seen by her gastroenterologist, Kathryn. Collene Ramsey, who performed a colonoscopy which was unremarkable.  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 She was treated  adjuvantly with carboplatin and paclitaxel, as detailed below. Her CA 125 dropped from 1226 on 03/20/2015 to 26.1 by 06/16/2015.  Her subsequent history is as detailed above.   PAST MEDICAL HISTORY: Past  Medical History:  Diagnosis Date  . Acute sensory neuropathy 06/26/2015  . Allergy   . Anemia   . Asthma    illness induced asthma  . Blood transfusion without reported diagnosis    at age 49 years old D/T surgery to femur being crushed  . 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 Heart murmur    pt, states had a "working heart murmur"  . History of kidney stones   . Neutropenia, drug-induced ([place]) 03/10/2018  . PONV (postoperative nausea and vomiting)   . Skin cancer     PAST SURGICAL HISTORY: Past Surgical History:  Procedure Laterality Date  . 3 laporscopic proceedures    . ABDOMINAL HYSTERECTOMY     2010  . CHOLECYSTECTOMY     2010  . DILATION AND CURETTAGE OF UTERUS     1997  . IR IMAGING GUIDED PORT INSERTION  03/08/2018  . LAPAROSCOPY N/A 03/27/2015   Procedure: DIAGNOSTIC LAPAROSCOPY ;  Surgeon: Kathryn Amber, MD;  Location: [facility];  Service: Gynecology;  Laterality: N/A;  . LAPAROSCOPY N/A 02/24/2016   Procedure: LAPAROSCOPY DIAGNOSTIC WITH PERITONEAL WASHINGS AND PERITONEAL BIOPSY;  Surgeon: Kathryn Amber, MD;  Location: [facility];  Service: Gynecology;  Laterality: N/A;  . LAPAROTOMY N/A 03/27/2015   Procedure: EXPLORATORY LAPAROTOMY, BILATERAL SALPINGO OOPHORECTOMY, OMENTECTOMY, RADICAL TUMOR DEBULKING;  Surgeon: Kathryn Amber, MD;  Location: [facility];  Service: Gynecology;  Laterality: N/A;  . sinus surgery 1994      FAMILY HISTORY Family History  Problem Relation Age of Onset  . Hypertension Father   . Skin cancer Father        nonmelanoma skin cancers in his late 74s  . Non-Hodgkin's lymphoma Maternal Aunt        dx. 46s; smoker  . Stroke Maternal Grandmother   . Other Mother        benign meningioma dx. early-mid-70s; hysterectomy in her late 83s for heavy periods - still has ovaries  . Other Son        one son with pre-cancerous skin findings  . Other Daughter        cysts on ovaries and hx of  heavy periods  . Other Sister        hysterectomy for cysts - still has ovaries  . Heart attack Maternal Grandfather   . Heart attack Paternal Grandfather   . Renal cancer Maternal Aunt 60       smoker  . Breast cancer Maternal Aunt 78  . Other Maternal Aunt        dx. benign brain tumor (meningioma) at 38; 2nd benign brain tumor in her late 17s; hx of radical hysterectomy at age 77  . Brain cancer Other        NOS tumor  The patient's parents are both living, in their late 31s as of January 2018. The patient has one brother, 3 sisters. There is no history of ovarian cancer in the family. One maternal aunt had breast and kidney cancer, another had non-Hodgkin's lymphoma.   GYNECOLOGIC HISTORY:  No LMP recorded. Patient has had a hysterectomy. 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 Her husband Kathryn Ramsey is a Engineer, maintenance (IT). Their children are 89, 63, 44 and 16 y/o as of JAN 2018. The oldest works as an Electrical engineer in the [employer] in [place]. The other 3 children live in [place], the youngest currently attending [employer]. The patient has no grandchildren. She attends [employer]   ADVANCED DIRECTIVES: In the absence of any documentation to the contrary, the patient's spouse is their HCPOA.    HEALTH MAINTENANCE: Social History   Tobacco Use  . Smoking status: Never Smoker  . Smokeless tobacco: Never Used  Substance Use Topics  . Alcohol use: Yes    Comment:  3 glasses wine or beer/week  . Drug use: No    Colonoscopy:2016  PAP: s/p hyst  Bone density:   Allergies  Allergen Reactions  . Bee Venom Shortness Of Breath    swelling  . Prochlorperazine Edisylate Anaphylaxis, Anxiety and Palpitations    Anaphylaxis, Anxiety and Palpitations   . Sulfa Antibiotics Shortness Of Breath    Vomit , diarrhea , hives  . Zarxio [Filgrastim]     Current Outpatient  Medications  Medication Sig Dispense Refill  . acetaminophen (TYLENOL) 500 MG tablet Take 2 tablets by mouth 3 (three) times daily as needed.    [name] acetaminophen-codeine (TYLENOL #3) 300-30 MG tablet Take 2 tablets by mouth 4 (four) times daily as needed. 60 tablet 0  . albuterol (VENTOLIN HFA) 108 (90 Base) MCG/ACT inhaler Inhale into the lungs.    . diphenhydrAMINE (BENADRYL) 50 MG capsule 50 mg Diphenhydramine (Benadryl) PO within 1 hour of the injection 1 capsule 0  . fluticasone (FLONASE) 50 MCG/ACT nasal spray Place 1 spray into both nostrils 2 (two) times daily. (Patient taking differently: Place 1 spray into both nostrils as needed. ) 16 g 2  . gabapentin (NEURONTIN) 100 MG capsule Take 2 capsules (200 mg total) by mouth 3 (three) times daily. 540 capsule 1  . hydrocortisone cream 1 % Apply 1 application topically 3 (three) times daily.    [name] ibuprofen (ADVIL) 600 MG tablet Take 1 tablet by mouth 4 (four) times daily as needed.    . lidocaine-prilocaine (EMLA) cream APPLY TOPICALLY AS NEEDED. 30 g 0  . loratadine (CLARITIN) 10 MG tablet Take 10 mg by mouth as needed.     . magnesium oxide (MAG-OX) 400 (241.3 Mg) MG tablet Take 1 tablet (400 mg total) by mouth daily. 60 tablet 3  . Multiple Vitamins-Minerals (MULTIVITAMIN ADULT PO) Take 1 tablet by mouth daily.    . Omega 3 1000 MG CAPS Take 1 capsule by mouth daily.    [name] omeprazole (PRILOSEC) 40 MG capsule Take 1 capsule (40 mg total) by mouth daily. 30 capsule 5  . ondansetron (ZOFRAN) 8 MG tablet     . senna-docusate (SENOKOT-S) 8.6-50 MG tablet Take 1-2 tablets by mouth 2 (two) times daily as needed.    . sertraline (ZOLOFT) 50 MG tablet TAKE 1 TABLET ONCE DAILY. 30 tablet 3  . traMADol (ULTRAM) 50 MG tablet Take 1 tablet (50 mg total) by mouth every 6 (six) hours as needed. 60 tablet 0  . valACYclovir (VALTREX) 1000 MG tablet Take 1 tablet (1,000 mg total) by mouth daily. 5 tablet 0  . VENTOLIN HFA 108 (90 Base) MCG/ACT inhaler     .  vitamin B-12 (CYANOCOBALAMIN) 1000 MCG tablet Take 1 tablet by mouth daily.     No current facility-administered medications for  this visit.   Facility-Administered Medications Ordered in Other Visits  Medication Dose Route Frequency Provider Last Rate Last Admin  . heparin lock flush 100 unit/mL  500 Units Intravenous Once Magrinat, Virgie Dad, MD      . sodium chloride flush (NS) 0.9 % injection 10 mL  10 mL Intravenous PRN Magrinat, Virgie Dad, MD        OBJECTIVE: Middle-aged white woman in no acute distress  Vitals:   02/27/19 0914  BP: 109/73  Pulse: 66  Resp: 18  Temp: 98.3 F (36.8 C)  SpO2: 100%     Body mass index is 19.98 kg/m.   Filed Weights   02/27/19 0914  Weight: 112 lb 12.8 oz (51.2 kg)    ECOG FS:1 - Symptomatic but completely ambulatory  Sclerae unicteric, EOMs intact Wearing a mask No cervical or supraclavicular adenopathy Lungs no rales or rhonchi, dullness to percussion left base Heart regular rate and rhythm Abd soft, nontender, positive bowel sounds, no masses palpated MSK no focal spinal tenderness, no upper extremity lymphedema Neuro: nonfocal, well oriented, appropriate affect Breasts: Deferred   LAB RESULTS:  CMP     Component Value Date/Time   NA 137 02/20/2019 1230   NA 140 02/11/2017 0755   K 4.1 02/20/2019 1230   K 4.4 02/11/2017 0755   CL 102 02/20/2019 1230   CO2 24 02/20/2019 1230   CO2 26 02/11/2017 0755   GLUCOSE 91 02/20/2019 1230   GLUCOSE 93 02/11/2017 0755   BUN 13 02/20/2019 1230   BUN 7.9 02/11/2017 0755   CREATININE 0.79 02/20/2019 1230   CREATININE 1.06 (H) 06/19/2018 0931   CREATININE 0.7 02/11/2017 0755   CALCIUM 9.0 02/20/2019 1230   CALCIUM 9.6 02/11/2017 0755   PROT 7.0 02/20/2019 1230   PROT 7.5 02/11/2017 0755   ALBUMIN 3.1 (L) 02/20/2019 1230   ALBUMIN 4.4 02/11/2017 0755   AST 14 (L) 02/20/2019 1230   AST 22 06/19/2018 0931   AST 27 02/11/2017 0755   ALT 8 02/20/2019 1230   ALT 13 06/19/2018 0931    ALT 24 02/11/2017 0755   ALKPHOS 77 02/20/2019 1230   ALKPHOS 58 02/11/2017 0755   BILITOT 0.2 (L) 02/20/2019 1230   BILITOT 0.2 (L) 06/19/2018 0931   BILITOT 0.43 02/11/2017 0755   GFRNONAA >60 02/20/2019 1230   GFRNONAA 59 (L) 06/19/2018 0931   GFRAA >60 02/20/2019 1230   GFRAA >60 06/19/2018 0931    INo results found for: SPEP, UPEP  Lab Results  Component Value Date   WBC 3.1 (L) 02/27/2019   NEUTROABS 1.5 (L) 02/27/2019   HGB 8.9 (L) 02/27/2019   HCT 28.7 (L) 02/27/2019   MCV 90.0 02/27/2019   PLT 360 02/27/2019      Chemistry      Component Value Date/Time   NA 137 02/20/2019 1230   NA 140 02/11/2017 0755   K 4.1 02/20/2019 1230   K 4.4 02/11/2017 0755   CL 102 02/20/2019 1230   CO2 24 02/20/2019 1230   CO2 26 02/11/2017 0755   BUN 13 02/20/2019 1230   BUN 7.9 02/11/2017 0755   CREATININE 0.79 02/20/2019 1230   CREATININE 1.06 (H) 06/19/2018 0931   CREATININE 0.7 02/11/2017 0755      Component Value Date/Time   CALCIUM 9.0 02/20/2019 1230   CALCIUM 9.6 02/11/2017 0755   ALKPHOS 77 02/20/2019 1230   ALKPHOS 58 02/11/2017 0755   AST 14 (L) 02/20/2019 1230   AST 22 06/19/2018  0931   AST 27 02/11/2017 0755   ALT 8 02/20/2019 1230   ALT 13 06/19/2018 0931   ALT 24 02/11/2017 0755   BILITOT 0.2 (L) 02/20/2019 1230   BILITOT 0.2 (L) 06/19/2018 0931   BILITOT 0.43 02/11/2017 0755       No results found for: LABCA2  No components found for: VELFY101  No results for input(s): INR in the last 168 hours.  Urinalysis    Component Value Date/Time   COLORURINE STRAW (A) 11/22/2017 0940   APPEARANCEUR CLEAR 11/22/2017 0940   LABSPEC 1.002 (L) 11/22/2017 0940   LABSPEC 1.005 10/09/2015 1239   PHURINE 7.0 11/22/2017 0940   GLUCOSEU NEGATIVE 11/22/2017 0940   GLUCOSEU Negative 10/09/2015 1239   HGBUR NEGATIVE 11/22/2017 0940   BILIRUBINUR NEGATIVE 11/22/2017 0940   BILIRUBINUR Negative 10/09/2015 1239   KETONESUR NEGATIVE 11/22/2017 0940   PROTEINUR  NEGATIVE 02/20/2019 1350   UROBILINOGEN 0.2 10/09/2015 1239   NITRITE NEGATIVE 11/22/2017 0940   LEUKOCYTESUR NEGATIVE 11/22/2017 0940   LEUKOCYTESUR Negative 10/09/2015 1239    STUDIES: No results found.   ELIGIBLE FOR AVAILABLE RESEARCH PROTOCOL:  no  ASSESSMENT: 54 y.o. BRCA negative Kathryn Ramsey woman status post radical tumor debulking with optimal cytoreduction (R0) 03/27/2015 for a stage IIIC, high-grade right fallopian tube carcinoma  (a) baseline CA-125 was 1226.  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)  (c) tumor is PD-L1 negative (02/24/2016)  (d) tumor is strongly estrogen receptor positive, progesterone receptor negative (02/24/2016)  (1) adjuvant chemotherapy consisted of carboplatin and paclitaxel for 6 doses, begun 04/14/2015, completed 08/11/2015  (a) paclitaxel was omitted from cycle 5 and dose reduced on cycle 6 because of neuropathy   (b) a "make up" dose of paclitaxel was given 08/25/2015  (c) last carboplatin dose was 08/11/2015  (d) CA-125 normalized by 06/16/2015   (2) FIRST RECURRENCE: January 2018  (a) CA-125 rise beginning November 2017 led to CT scans and PET scans December 2017, all negative  (b) exploratory laparotomy 02/24/2016 showed pathologically confirmed recurrence, with miliary disease involving all examined surfaces  (c) port-site metastases noted 03/08/2016  (d) the January 2018 tumor sample was tested for the estrogen receptor and he was 80 percent positive with strong staining, progesterone receptor negative  (3) carboplatin/liposomal doxorubicin started 03/05/2016, repeated every three weeks x4, completed 06/04/2016.  (a) cycle 3 delayed one week because of neutropenia; OnPro added  (b) CA 125 normalized after cycle 3  (4) RUCAPARIB maintenance started 07/02/2016  (a)  restaging 10/01/2016: Normal CA 125, negative CT of the abdomen and pelvis  (b) labs 11/24/2016 shows a rise in her CA 125-43.4, with continuing rise thereafter  (c) rucaparib discontinued October 2018 with rising CA 125  (5) anastrozole started December 23, 2016-stopped after a couple of weeks with poor tolerance  SECOND RECURRENCE: (6) Gemcitabine/Bevacizumab started on 02/04/2017  (a) gemcitabine omitted cycle 4 because of intercurrent infection  (b) gemcitabine resumed with cycle 5, with intervening rise in  Ca 125  (c) repeat CT scan of the abdomen and pelvis on 05/26/2017 does not confirm obvious disease progression  (d) cycle 5 of gemcitabine/bevacizumab delayed 1 week   (e) gemcitabine/ bevacizumab discontinued after 6 cycles, with rising CA 125 (last dose 06/24/2017)  (7) foundation one testing did not show a high mutation burden and the microsatellite status was stable.  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  These were not immediately actionable  (8) Abraxane started 07/12/2017, given weekly or weeks 1 and 2 of each 3 weeks cycle, last dose 11/15/2017 (5 cycles)  (a) cycle 2 started 08/09/2017, will be day 1 day 8 only  (b) cycle 3 scheduled to start 09/13/2017, day 1 day 8 at patient request  (c) CT of the abdomen and pelvis 11/28/2017 shows no measurable disease  (d) PET scan 01/25/2018 and MRI of the pelvis 02/03/2018 show no measurable disease  (e) patient has symptomatic ascites and a rapidly rising Ca1 25  (9) started cisplatin/gemcitabine days 1 and 8 of each 21-day cycle on 02/18/2018  (a) switched to carboplatin/ gemcitabine 07/17/2018 due to neuropathy  (b) carbo/gemzar changed to Q14 days 07/24/2018 due to neutropenia  (c) considered niraparib/Zejula 100 mg, 2 tabs daily at the completion of chemotherapy  (10) associated problems  (a) anemia due to chemotherapy: On Aranesp  (b) peripheral neuropathy due to chemotherapy: On gabapentin  (11) participated  in AMV-564 trial at [facility] (a CD33-T-cell bivalent Ab that may restore anti-cancer immune effectiveness) 20. 10/25/18 Consenting for AMV-564 clinical trial 21. 11/13/18 AMV-564 initiation 75 mcg dose 22. 10/8 Dropped down to 50 mcg dose (I think that date is correct) 23. 10/19-21/20 Tx held due to Neutropenia 24. 12/06/18 Given Neulasta 25. 12/07/18 AMV-564 resumed 26. 01/03/19 CT C/A/P 10% RECIST progression 27. 01/15/19 Increased dose to 75 mcg/daily 28. 01/17/19 Thoracentesis with cytology c/w Mullerian primary  29. Paracentesis 02/05/2019: cytology c/w adenocarcinoma  (12) schedule of thoracenteses:  (a) left 01/17/2019 [facility])  (a) left 02/06/2019 [facility])  (13) schedule of paracenteses:  (a) 01/30/2019 ([place])  (b) 02/06/2019 ([place])  (c) 02/18/2018 ([place])  (14) abraxane started 02/20/2019, repeated days 1 and 8 of every 21 day cycle  (a) bevacizumab/Avastin started 02/20/2019 and repeated every 21 days  PLAN: Zasha tolerated her first Abraxane/Avastin treatment generally well.  She had fatigue on day 2 and that was the primary problem.  This may occur with Abraxane alone or perhaps it is only going to happen with Abraxane plus Avastin.  We will find out tomorrow.  If this is going to be a recurrent problem we could try low-dose steroids on day 2.  Her peripheral neuropathy is troublesome but it is not worse.  She would like to try some acupuncture and I wrote her a prescription for that.  Meanwhile she continues on gabapentin 2 in the afternoon and 2 at bedtime.  We discussed with her to do the Abraxane 3 weeks on and 1 week off for 2 weeks on and 1 week off.  The difference is not great.  She would get 6 doses over 8 weeks or over 9 weeks.  We decided to having that third week off is worth it and that accordingly is the plan.  She has completed advanced directives but not yet notarized them.  She will see me again on the day 1 of each treatment cycle.  Note that is discussed above  she does not want to know her CA-125 unless it is trending down.  Right now she does not need either thoracentesis or paracentesis.  She is monitoring her weight.  She knows to call for any other issue that may develop before the next visit  Total encounter time 35 minutes*  Cuahutemoc Attar, Virgie Dad, MD  02/27/19 9:29 AM Medical Oncology and Hematology [facility] [place], [place] [address] Tel. [phone]  Fax. [phone]   I, Wilburn Mylar, am acting as scribe for Kathryn. Virgie Dad. Magrinat.  I, Lurline Del MD, have reviewed the above documentation for accuracy and completeness, and I agree with the above.   *Total Encounter Time as defined by the Centers for Medicare and Medicaid Services includes, in addition to the face-to-face time of a patient visit (documented in the note above) non-face-to-face time: obtaining and reviewing outside history, ordering and reviewing medications, tests or procedures, care coordination (communications with other health care professionals or caregivers) and documentation in the medical record.

## 2019-02-27 ENCOUNTER — Other Ambulatory Visit: Payer: Self-pay

## 2019-02-27 ENCOUNTER — Inpatient Hospital Stay: Payer: 59

## 2019-02-27 ENCOUNTER — Inpatient Hospital Stay (HOSPITAL_BASED_OUTPATIENT_CLINIC_OR_DEPARTMENT_OTHER): Payer: 59 | Admitting: Oncology

## 2019-02-27 VITALS — BP 109/73 | HR 66 | Temp 98.3°F | Resp 18 | Ht 63.0 in | Wt 112.8 lb

## 2019-02-27 DIAGNOSIS — D702 Other drug-induced agranulocytosis: Secondary | ICD-10-CM

## 2019-02-27 DIAGNOSIS — Z5112 Encounter for antineoplastic immunotherapy: Secondary | ICD-10-CM | POA: Diagnosis not present

## 2019-02-27 DIAGNOSIS — C762 Malignant neoplasm of abdomen: Secondary | ICD-10-CM

## 2019-02-27 DIAGNOSIS — Z7189 Other specified counseling: Secondary | ICD-10-CM | POA: Diagnosis not present

## 2019-02-27 DIAGNOSIS — C5701 Malignant neoplasm of right fallopian tube: Secondary | ICD-10-CM | POA: Diagnosis not present

## 2019-02-27 DIAGNOSIS — C801 Malignant (primary) neoplasm, unspecified: Secondary | ICD-10-CM

## 2019-02-27 DIAGNOSIS — C786 Secondary malignant neoplasm of retroperitoneum and peritoneum: Secondary | ICD-10-CM

## 2019-02-27 DIAGNOSIS — T451X5A Adverse effect of antineoplastic and immunosuppressive drugs, initial encounter: Secondary | ICD-10-CM

## 2019-02-27 DIAGNOSIS — Z95828 Presence of other vascular implants and grafts: Secondary | ICD-10-CM

## 2019-02-27 DIAGNOSIS — D701 Agranulocytosis secondary to cancer chemotherapy: Secondary | ICD-10-CM

## 2019-02-27 LAB — COMPREHENSIVE METABOLIC PANEL
ALT: 11 U/L (ref 0–44)
AST: 16 U/L (ref 15–41)
Albumin: 3.5 g/dL (ref 3.5–5.0)
Alkaline Phosphatase: 65 U/L (ref 38–126)
Anion gap: 11 (ref 5–15)
BUN: 18 mg/dL (ref 6–20)
CO2: 22 mmol/L (ref 22–32)
Calcium: 8.9 mg/dL (ref 8.9–10.3)
Chloride: 106 mmol/L (ref 98–111)
Creatinine, Ser: 0.86 mg/dL (ref 0.44–1.00)
GFR calc Af Amer: >60 mL/min (ref >60)
GFR calc non Af Amer: >60 mL/min (ref >60)
Glucose, Bld: 88 mg/dL (ref 70–99)
Potassium: 4.3 mmol/L (ref 3.5–5.1)
Sodium: 139 mmol/L (ref 135–145)
Total Bilirubin: 0.2 mg/dL — ABNORMAL LOW (ref 0.3–1.2)
Total Protein: 7.3 g/dL (ref 6.5–8.1)

## 2019-02-27 LAB — CBC WITH DIFFERENTIAL/PLATELET
Abs Immature Granulocytes: 0.01 10*3/uL (ref 0.00–0.07)
Basophils Absolute: 0 10*3/uL (ref 0.0–0.1)
Basophils Relative: 1 %
Eosinophils Absolute: 0.1 10*3/uL (ref 0.0–0.5)
Eosinophils Relative: 2 %
HCT: 28.7 % — ABNORMAL LOW (ref 36.0–46.0)
Hemoglobin: 8.9 g/dL — ABNORMAL LOW (ref 12.0–15.0)
Immature Granulocytes: 0 %
Lymphocytes Relative: 44 %
Lymphs Abs: 1.4 10*3/uL (ref 0.7–4.0)
MCH: 27.9 pg (ref 26.0–34.0)
MCHC: 31 g/dL (ref 30.0–36.0)
MCV: 90 fL (ref 80.0–100.0)
Monocytes Absolute: 0.2 10*3/uL (ref 0.1–1.0)
Monocytes Relative: 7 %
Neutro Abs: 1.5 10*3/uL — ABNORMAL LOW (ref 1.7–7.7)
Neutrophils Relative %: 46 %
Platelets: 360 10*3/uL (ref 150–400)
RBC: 3.19 MIL/uL — ABNORMAL LOW (ref 3.87–5.11)
RDW: 15.2 % (ref 11.5–15.5)
WBC: 3.1 10*3/uL — ABNORMAL LOW (ref 4.0–10.5)
nRBC: 0 % (ref 0.0–0.2)

## 2019-02-27 MED ORDER — HEPARIN SOD (PORK) LOCK FLUSH 100 UNIT/ML IV SOLN
500.0000 [IU] | Freq: Once | INTRAVENOUS | Status: AC | PRN
  Administered 2019-02-27: 12:00:00 500 [IU]
  Filled 2019-02-27: qty 5

## 2019-02-27 MED ORDER — EPOETIN ALFA-EPBX 40000 UNIT/ML IJ SOLN
40000.0000 [IU] | Freq: Once | INTRAMUSCULAR | Status: AC
  Administered 2019-02-27: 12:00:00 40000 [IU] via SUBCUTANEOUS

## 2019-02-27 MED ORDER — ONDANSETRON HCL 8 MG PO TABS
8.0000 mg | ORAL_TABLET | Freq: Once | ORAL | Status: AC
  Administered 2019-02-27: 10:00:00 8 mg via ORAL

## 2019-02-27 MED ORDER — ONDANSETRON HCL 8 MG PO TABS
ORAL_TABLET | ORAL | Status: AC
  Filled 2019-02-27: qty 1

## 2019-02-27 MED ORDER — SODIUM CHLORIDE 0.9% FLUSH
10.0000 mL | INTRAVENOUS | Status: DC | PRN
  Administered 2019-02-27: 12:00:00 10 mL
  Filled 2019-02-27: qty 10

## 2019-02-27 MED ORDER — SODIUM CHLORIDE 0.9 % IV SOLN
Freq: Once | INTRAVENOUS | Status: AC
  Filled 2019-02-27: qty 250

## 2019-02-27 MED ORDER — PACLITAXEL PROTEIN-BOUND CHEMO INJECTION 100 MG
100.0000 mg/m2 | Freq: Once | INTRAVENOUS | Status: AC
  Administered 2019-02-27: 150 mg via INTRAVENOUS
  Filled 2019-02-27: qty 30

## 2019-02-27 MED ORDER — SODIUM CHLORIDE 0.9% FLUSH
10.0000 mL | INTRAVENOUS | Status: DC | PRN
  Administered 2019-02-27: 10 mL
  Filled 2019-02-27: qty 10

## 2019-02-27 MED ORDER — EPOETIN ALFA-EPBX 40000 UNIT/ML IJ SOLN
INTRAMUSCULAR | Status: AC
  Filled 2019-02-27: qty 1

## 2019-02-27 NOTE — Patient Instructions (Signed)
[facility] Discharge Instructions for Patients Receiving Chemotherapy  Today you received the following chemotherapy agents:  Abraxane.  To help prevent nausea and vomiting after your treatment, we encourage you to take your nausea medication as directed.   If you develop nausea and vomiting that is not controlled by your nausea medication, call the clinic.   BELOW ARE SYMPTOMS THAT SHOULD BE REPORTED IMMEDIATELY:  *FEVER GREATER THAN 100.5 F  *CHILLS WITH OR WITHOUT FEVER  NAUSEA AND VOMITING THAT IS NOT CONTROLLED WITH YOUR NAUSEA MEDICATION  *UNUSUAL SHORTNESS OF BREATH  *UNUSUAL BRUISING OR BLEEDING  TENDERNESS IN MOUTH AND THROAT WITH OR WITHOUT PRESENCE OF ULCERS  *URINARY PROBLEMS  *BOWEL PROBLEMS  UNUSUAL RASH Items with * indicate a potential emergency and should be followed up as soon as possible.  Feel free to call the clinic should you have any questions or concerns. The clinic phone number is [phone].  Please show the CHEMO ALERT CARD at check-in to the Emergency Department and triage nurse.   

## 2019-03-06 ENCOUNTER — Other Ambulatory Visit: Payer: Self-pay | Admitting: Oncology

## 2019-03-13 ENCOUNTER — Inpatient Hospital Stay: Payer: 59

## 2019-03-13 ENCOUNTER — Other Ambulatory Visit: Payer: Self-pay

## 2019-03-13 ENCOUNTER — Inpatient Hospital Stay (HOSPITAL_BASED_OUTPATIENT_CLINIC_OR_DEPARTMENT_OTHER): Payer: 59 | Admitting: Adult Health

## 2019-03-13 ENCOUNTER — Encounter: Payer: Self-pay | Admitting: Adult Health

## 2019-03-13 ENCOUNTER — Encounter: Payer: Self-pay | Admitting: Oncology

## 2019-03-13 VITALS — BP 110/82 | HR 63 | Temp 97.4°F | Resp 17 | Ht 63.0 in | Wt 112.9 lb

## 2019-03-13 DIAGNOSIS — D702 Other drug-induced agranulocytosis: Secondary | ICD-10-CM

## 2019-03-13 DIAGNOSIS — C762 Malignant neoplasm of abdomen: Secondary | ICD-10-CM

## 2019-03-13 DIAGNOSIS — Z7189 Other specified counseling: Secondary | ICD-10-CM

## 2019-03-13 DIAGNOSIS — D701 Agranulocytosis secondary to cancer chemotherapy: Secondary | ICD-10-CM

## 2019-03-13 DIAGNOSIS — C801 Malignant (primary) neoplasm, unspecified: Secondary | ICD-10-CM | POA: Diagnosis not present

## 2019-03-13 DIAGNOSIS — C786 Secondary malignant neoplasm of retroperitoneum and peritoneum: Secondary | ICD-10-CM

## 2019-03-13 DIAGNOSIS — Z95828 Presence of other vascular implants and grafts: Secondary | ICD-10-CM

## 2019-03-13 DIAGNOSIS — G620 Drug-induced polyneuropathy: Secondary | ICD-10-CM

## 2019-03-13 DIAGNOSIS — T451X5A Adverse effect of antineoplastic and immunosuppressive drugs, initial encounter: Secondary | ICD-10-CM

## 2019-03-13 DIAGNOSIS — Z5112 Encounter for antineoplastic immunotherapy: Secondary | ICD-10-CM | POA: Diagnosis not present

## 2019-03-13 DIAGNOSIS — C5701 Malignant neoplasm of right fallopian tube: Secondary | ICD-10-CM

## 2019-03-13 LAB — COMPREHENSIVE METABOLIC PANEL
ALT: 10 U/L (ref 0–44)
AST: 18 U/L (ref 15–41)
Albumin: 3.9 g/dL (ref 3.5–5.0)
Alkaline Phosphatase: 67 U/L (ref 38–126)
Anion gap: 9 (ref 5–15)
BUN: 22 mg/dL — ABNORMAL HIGH (ref 6–20)
CO2: 24 mmol/L (ref 22–32)
Calcium: 9.2 mg/dL (ref 8.9–10.3)
Chloride: 104 mmol/L (ref 98–111)
Creatinine, Ser: 0.85 mg/dL (ref 0.44–1.00)
GFR calc Af Amer: >60 mL/min (ref >60)
GFR calc non Af Amer: >60 mL/min (ref >60)
Glucose, Bld: 88 mg/dL (ref 70–99)
Potassium: 4.4 mmol/L (ref 3.5–5.1)
Sodium: 137 mmol/L (ref 135–145)
Total Bilirubin: 0.3 mg/dL (ref 0.3–1.2)
Total Protein: 7.6 g/dL (ref 6.5–8.1)

## 2019-03-13 LAB — CBC WITH DIFFERENTIAL/PLATELET
Abs Immature Granulocytes: 0.01 10*3/uL (ref 0.00–0.07)
Basophils Absolute: 0 10*3/uL (ref 0.0–0.1)
Basophils Relative: 1 %
Eosinophils Absolute: 0 10*3/uL (ref 0.0–0.5)
Eosinophils Relative: 1 %
HCT: 32.7 % — ABNORMAL LOW (ref 36.0–46.0)
Hemoglobin: 10 g/dL — ABNORMAL LOW (ref 12.0–15.0)
Immature Granulocytes: 0 %
Lymphocytes Relative: 39 %
Lymphs Abs: 1.3 10*3/uL (ref 0.7–4.0)
MCH: 28.5 pg (ref 26.0–34.0)
MCHC: 30.6 g/dL (ref 30.0–36.0)
MCV: 93.2 fL (ref 80.0–100.0)
Monocytes Absolute: 0.4 10*3/uL (ref 0.1–1.0)
Monocytes Relative: 12 %
Neutro Abs: 1.6 10*3/uL — ABNORMAL LOW (ref 1.7–7.7)
Neutrophils Relative %: 47 %
Platelets: 319 10*3/uL (ref 150–400)
RBC: 3.51 MIL/uL — ABNORMAL LOW (ref 3.87–5.11)
RDW: 17.8 % — ABNORMAL HIGH (ref 11.5–15.5)
WBC: 3.3 10*3/uL — ABNORMAL LOW (ref 4.0–10.5)
nRBC: 0 % (ref 0.0–0.2)

## 2019-03-13 LAB — TOTAL PROTEIN, URINE DIPSTICK: Protein, ur: NEGATIVE mg/dL

## 2019-03-13 MED ORDER — ONDANSETRON HCL 8 MG PO TABS
8.0000 mg | ORAL_TABLET | Freq: Once | ORAL | Status: AC
  Administered 2019-03-13: 12:00:00 8 mg via ORAL

## 2019-03-13 MED ORDER — SODIUM CHLORIDE 0.9 % IV SOLN
Freq: Once | INTRAVENOUS | Status: AC
  Filled 2019-03-13: qty 250

## 2019-03-13 MED ORDER — HEPARIN SOD (PORK) LOCK FLUSH 100 UNIT/ML IV SOLN
500.0000 [IU] | Freq: Once | INTRAVENOUS | Status: AC | PRN
  Administered 2019-03-13: 14:00:00 500 [IU]
  Filled 2019-03-13: qty 5

## 2019-03-13 MED ORDER — PACLITAXEL PROTEIN-BOUND CHEMO INJECTION 100 MG
100.0000 mg/m2 | Freq: Once | INTRAVENOUS | Status: AC
  Administered 2019-03-13: 13:00:00 150 mg via INTRAVENOUS
  Filled 2019-03-13: qty 30

## 2019-03-13 MED ORDER — SODIUM CHLORIDE 0.9% FLUSH
10.0000 mL | INTRAVENOUS | Status: DC | PRN
  Administered 2019-03-13: 14:00:00 10 mL
  Filled 2019-03-13: qty 10

## 2019-03-13 MED ORDER — SODIUM CHLORIDE 0.9 % IV SOLN
15.0000 mg/kg | Freq: Once | INTRAVENOUS | Status: AC
  Administered 2019-03-13: 13:00:00 800 mg via INTRAVENOUS
  Filled 2019-03-13: qty 32

## 2019-03-13 MED ORDER — ONDANSETRON HCL 8 MG PO TABS
ORAL_TABLET | ORAL | Status: AC
  Filled 2019-03-13: qty 1

## 2019-03-13 MED ORDER — SODIUM CHLORIDE 0.9% FLUSH
10.0000 mL | INTRAVENOUS | Status: DC | PRN
  Administered 2019-03-13: 10 mL
  Filled 2019-03-13: qty 10

## 2019-03-13 NOTE — Progress Notes (Signed)
No Retacrit today per Burman Nieves in pharmacy

## 2019-03-13 NOTE — Patient Instructions (Signed)
[facility] Discharge Instructions for Patients Receiving Chemotherapy  Today you received the following chemotherapy agents:  Abraxane.  To help prevent nausea and vomiting after your treatment, we encourage you to take your nausea medication as directed.   If you develop nausea and vomiting that is not controlled by your nausea medication, call the clinic.   BELOW ARE SYMPTOMS THAT SHOULD BE REPORTED IMMEDIATELY:  *FEVER GREATER THAN 100.5 F  *CHILLS WITH OR WITHOUT FEVER  NAUSEA AND VOMITING THAT IS NOT CONTROLLED WITH YOUR NAUSEA MEDICATION  *UNUSUAL SHORTNESS OF BREATH  *UNUSUAL BRUISING OR BLEEDING  TENDERNESS IN MOUTH AND THROAT WITH OR WITHOUT PRESENCE OF ULCERS  *URINARY PROBLEMS  *BOWEL PROBLEMS  UNUSUAL RASH Items with * indicate a potential emergency and should be followed up as soon as possible.  Feel free to call the clinic should you have any questions or concerns. The clinic phone number is [phone].  Please show the CHEMO ALERT CARD at check-in to the Emergency Department and triage nurse.   

## 2019-03-13 NOTE — Patient Instructions (Signed)
COVID-19 Vaccine Information can be found at: [email or web address] For questions related to vaccine distribution or appointments, please email [email or web address] or call [phone]    

## 2019-03-13 NOTE — Progress Notes (Signed)
[place]  Telephone:([phone]) [phone] Fax:(336) [phone]    ID: [name] DOB: 01-24-1966  MR#: [id number]  YQI#:[id number]  Patient Care Team: Harlan Stains, MD as PCP - General (Family Medicine) [name], MD as Consulting Physician (Obstetrics and Gynecology) Juanita Craver, MD as Consulting Physician (Gastroenterology) Servando Salina, MD as Consulting Physician (Obstetrics and Gynecology) Nickie Retort, MD as Consulting Physician (Urology) Gillis Ends, MD as Referring Physician (Obstetrics and Gynecology) Mettu, Baltazar Apo, MD as Referring Physician (Oncology) OTHER MD: Festus Aloe [phone]) Floyde Parkins  NOTE: Patient does not want to be informed of her CA-125.  CHIEF COMPLAINT:  ovarian cancer  CURRENT TREATMENT: Abraxane, bevacizumab   INTERVAL HISTORY: Sagan returns today for follow-up and treatment of her recurrent ovarian cancer accompanied by her husband.  She is receiving treatment with Abraxane and Bevacizumab given on day 1 of a 21 day cycle and Abraxane alone given on day 8 of a 21 day cycle.  Today is cycle 2 day 1 of treatment.    REVIEW OF SYSTEMS: Kalie is doing well today.  She continues on treatment.  Her biggest issue remains her neuropathy.  She is undergoing acupuncture for this.  Insurance has denied it, however if we write a letter they may approve it.  She has an occasional headache after the Avastin.    Stuti has noted an improvement in her ascites since starting therapy.  She denies any new pain, bowel/bladder issues.  She denies any fever, chills, chest pain, cough, shortness of breath, nausea, vomiting.  A detailed ROS was otherwise non contributory.     HISTORY OF PRESENT ILLNESS: From Dr. Serita Grit original intake note 03/17/2015:  "Ryenne Lynam is a very pleasant G4P4 who is seen in consultation at the request of Dr Collene Mares and Dr Garwin Brothers for peritoneal carcinomatosis. The patient has a history  of a workup by urology for gross hematuria which included a pelvic examine was suggested for extrinsic mass effect on the bladder on cystoscopy. Cystoscopy took place on in December 2016. The patient denies abdominal pain bloating early satiety or abdominal distention.  On 08/03/2015 at [facility] she underwent a CT scan of the abdomen and pelvis as ordered by Dr Baruch Gouty. This revealed a 1.4 cm low attenuation lesion on the posterior right hepatic lobe suggestive of a hemangioma. Status post hysterectomy. No ovarian masses. Moderate ascites. Omental caking seen in the lateral left abdomen and pelvis. Peritoneal nodularity in the left lateral pelvic cul-de-sac. No gross extrinsic compression on the bladder was identified on imaging.  The patient was then seen by her gastroenterologist, Dr. Collene Mares, who performed a colonoscopy which was unremarkable.  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 She was treated adjuvantly with carboplatin and paclitaxel, as detailed below. Her CA 125 dropped from 1226 on 03/20/2015 to 26.1 by 06/16/2015.  Her subsequent history is as detailed above.   PAST MEDICAL HISTORY: Past Medical History:  Diagnosis Date  . Acute sensory neuropathy 06/26/2015  . Allergy   . Anemia   . Asthma    illness induced asthma  . Blood transfusion without reported diagnosis    at age 50 years old D/T surgery to femur  being crushed  . 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 Heart murmur    pt, states had a "working heart murmur"  . History of kidney stones   .  Neutropenia, drug-induced ([place]) 03/10/2018  . PONV (postoperative nausea and vomiting)   . Skin cancer     PAST SURGICAL HISTORY: Past Surgical History:  Procedure Laterality Date  . 3 laporscopic proceedures    . ABDOMINAL HYSTERECTOMY     2010  . CHOLECYSTECTOMY     2010  . DILATION AND CURETTAGE OF UTERUS     1997  . IR IMAGING GUIDED PORT INSERTION  03/08/2018  . LAPAROSCOPY N/A 03/27/2015   Procedure: DIAGNOSTIC LAPAROSCOPY ;  Surgeon: [name], MD;  Location: [facility];  Service: Gynecology;  Laterality: N/A;  . LAPAROSCOPY N/A 02/24/2016   Procedure: LAPAROSCOPY DIAGNOSTIC WITH PERITONEAL WASHINGS AND PERITONEAL BIOPSY;  Surgeon: [name], MD;  Location: [facility];  Service: Gynecology;  Laterality: N/A;  . LAPAROTOMY N/A 03/27/2015   Procedure: EXPLORATORY LAPAROTOMY, BILATERAL SALPINGO OOPHORECTOMY, OMENTECTOMY, RADICAL TUMOR DEBULKING;  Surgeon: [name], MD;  Location: [facility];  Service: Gynecology;  Laterality: N/A;  . sinus surgery 1994      FAMILY HISTORY Family History  Problem Relation Age of Onset  . Hypertension Father   . Skin cancer Father        nonmelanoma skin cancers in his late 18s  . Non-Hodgkin's lymphoma Maternal Aunt        dx. 23s; smoker  . Stroke Maternal Grandmother   . Other Mother        benign meningioma dx. early-mid-70s; hysterectomy in her late 46s for heavy periods - still has ovaries  . Other Son        one son with pre-cancerous skin findings  . Other Daughter        cysts on ovaries and hx of heavy periods  . Other Sister        hysterectomy for cysts - still has ovaries  . Heart attack Maternal Grandfather   . Heart attack Paternal Grandfather   . Renal cancer Maternal Aunt 60       smoker  . Breast cancer Maternal Aunt 78  . Other Maternal Aunt        dx. benign brain tumor (meningioma) at 88; 2nd benign brain tumor in her late 62s; hx of radical hysterectomy at age 77  . Brain cancer Other        NOS tumor  The patient's parents  are both living, in their late 64s as of January 2018. The patient has one brother, 3 sisters. There is no history of ovarian cancer in the family. One maternal aunt had breast and kidney cancer, another had non-Hodgkin's lymphoma.   GYNECOLOGIC HISTORY:  No LMP recorded. Patient has had a hysterectomy. 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 Her husband [name] is a Engineer, maintenance (IT). Their children are 51, 83, 31 and 4 y/o as of JAN 2018. The oldest works as an Electrical engineer in the [employer] in [place]. The other 3 children live in [place], the youngest currently attending [employer]. The patient has no grandchildren. She attends [employer]   ADVANCED DIRECTIVES: In the absence of any documentation to the contrary, the patient's spouse  is their HCPOA.    HEALTH MAINTENANCE: Social History   Tobacco Use  . Smoking status: Never Smoker  . Smokeless tobacco: Never Used  Substance Use Topics  . Alcohol use: Yes    Comment:  3 glasses wine or beer/week  . Drug use: No    Colonoscopy:2016  PAP: s/p hyst  Bone density:   Allergies  Allergen Reactions  . Bee Venom Shortness Of Breath    swelling  . Prochlorperazine Edisylate Anaphylaxis, Anxiety and Palpitations    Anaphylaxis, Anxiety and Palpitations   . Sulfa Antibiotics Shortness Of Breath    Vomit , diarrhea , hives  . Zarxio [Filgrastim]     Current Outpatient Medications  Medication Sig Dispense Refill  . acetaminophen (TYLENOL) 500 MG tablet Take 2 tablets by mouth 3 (three) times daily as needed.    [name] acetaminophen-codeine (TYLENOL #3) 300-30 MG tablet Take 2 tablets by mouth 4 (four) times daily as needed. 60 tablet 0  . albuterol (VENTOLIN HFA) 108 (90 Base) MCG/ACT inhaler Inhale into the lungs.    . gabapentin (NEURONTIN) 100 MG capsule Take 2 capsules (200 mg total) by mouth 3 (three) times daily. 540 capsule  1  . ibuprofen (ADVIL) 600 MG tablet Take 1 tablet by mouth 4 (four) times daily as needed.    . lidocaine-prilocaine (EMLA) cream APPLY TOPICALLY AS NEEDED. 30 g 0  . loratadine (CLARITIN) 10 MG tablet Take 10 mg by mouth as needed.     . magnesium oxide (MAG-OX) 400 (241.3 Mg) MG tablet Take 1 tablet (400 mg total) by mouth daily. 60 tablet 3  . Multiple Vitamins-Minerals (MULTIVITAMIN ADULT PO) Take 1 tablet by mouth daily.    . Omega 3 1000 MG CAPS Take 1 capsule by mouth daily.    . ondansetron (ZOFRAN) 8 MG tablet     . senna-docusate (SENOKOT-S) 8.6-50 MG tablet Take 1-2 tablets by mouth 2 (two) times daily as needed.    . sertraline (ZOLOFT) 50 MG tablet TAKE 1 TABLET ONCE DAILY. 30 tablet 0  . traMADol (ULTRAM) 50 MG tablet Take 1 tablet (50 mg total) by mouth every 6 (six) hours as needed. 60 tablet 0  . VENTOLIN HFA 108 (90 Base) MCG/ACT inhaler     . vitamin B-12 (CYANOCOBALAMIN) 1000 MCG tablet Take 1 tablet by mouth daily.    . diphenhydrAMINE (BENADRYL) 50 MG capsule 50 mg Diphenhydramine (Benadryl) PO within 1 hour of the injection 1 capsule 0  . fluticasone (FLONASE) 50 MCG/ACT nasal spray Place 1 spray into both nostrils 2 (two) times daily. (Patient taking differently: Place 1 spray into both nostrils as needed. ) 16 g 2  . hydrocortisone cream 1 % Apply 1 application topically 3 (three) times daily.    [name] omeprazole (PRILOSEC) 40 MG capsule Take 1 capsule (40 mg total) by mouth daily. 30 capsule 5  . valACYclovir (VALTREX) 1000 MG tablet Take 1 tablet (1,000 mg total) by mouth daily. 5 tablet 0   No current facility-administered medications for this visit.   Facility-Administered Medications Ordered in Other Visits  Medication Dose Route Frequency Provider Last Rate Last Admin  . bevacizumab-bvzr (ZIRABEV) 800 mg in sodium chloride 0.9 % 100 mL chemo infusion  15 mg/kg (Treatment Plan Recorded) Intravenous Once Gardenia Phlegm, NP      . heparin lock flush 100  unit/mL  500 Units Intravenous Once Magrinat, Virgie Dad, MD      . heparin lock flush 100 unit/mL  500  Units Intracatheter Once PRN Treyana Sturgell, Charlestine Massed, NP      . PACLitaxel-protein bound (ABRAXANE) chemo infusion 150 mg  100 mg/m2 (Treatment Plan Recorded) Intravenous Once Magrinat, Virgie Dad, MD      . sodium chloride flush (NS) 0.9 % injection 10 mL  10 mL Intravenous PRN Magrinat, Virgie Dad, MD      . sodium chloride flush (NS) 0.9 % injection 10 mL  10 mL Intracatheter PRN Gardenia Phlegm, NP        OBJECTIVE:   Vitals:   03/13/19 1011  BP: 110/82  Pulse: 63  Resp: 17  Temp: (!) 97.4 F (36.3 C)  SpO2: 100%     Body mass index is 20 kg/m.   Filed Weights   03/13/19 1011  Weight: 112 lb 14.4 oz (51.2 kg)    ECOG FS:1 - Symptomatic but completely ambulatory GENERAL: Patient is a well appearing female in no acute distress HEENT:  Sclerae anicteric.  Oropharynx clear and moist. No ulcerations or evidence of oropharyngeal candidiasis. Neck is supple.  NODES:  No cervical, supraclavicular, or axillary lymphadenopathy palpated.  BREAST EXAM:  Deferred. LUNGS:  Clear to auscultation bilaterally.  No wheezes or rhonchi. HEART:  Regular rate and rhythm. No murmur appreciated. ABDOMEN:  Soft, nontender.  Positive, normoactive bowel sounds. No organomegaly palpated. MSK:  No focal spinal tenderness to palpation. Full range of motion bilaterally in the upper extremities. EXTREMITIES:  No peripheral edema.   SKIN:  Clear with no obvious rashes or skin changes. No nail dyscrasia. NEURO:  Nonfocal. Well oriented.  Appropriate affect.     LAB RESULTS:  CMP     Component Value Date/Time   NA 137 03/13/2019 1001   NA 140 02/11/2017 0755   K 4.4 03/13/2019 1001   K 4.4 02/11/2017 0755   CL 104 03/13/2019 1001   CO2 24 03/13/2019 1001   CO2 26 02/11/2017 0755   GLUCOSE 88 03/13/2019 1001   GLUCOSE 93 02/11/2017 0755   BUN 22 (H) 03/13/2019 1001   BUN 7.9 02/11/2017  0755   CREATININE 0.85 03/13/2019 1001   CREATININE 1.06 (H) 06/19/2018 0931   CREATININE 0.7 02/11/2017 0755   CALCIUM 9.2 03/13/2019 1001   CALCIUM 9.6 02/11/2017 0755   PROT 7.6 03/13/2019 1001   PROT 7.5 02/11/2017 0755   ALBUMIN 3.9 03/13/2019 1001   ALBUMIN 4.4 02/11/2017 0755   AST 18 03/13/2019 1001   AST 22 06/19/2018 0931   AST 27 02/11/2017 0755   ALT 10 03/13/2019 1001   ALT 13 06/19/2018 0931   ALT 24 02/11/2017 0755   ALKPHOS 67 03/13/2019 1001   ALKPHOS 58 02/11/2017 0755   BILITOT 0.3 03/13/2019 1001   BILITOT 0.2 (L) 06/19/2018 0931   BILITOT 0.43 02/11/2017 0755   GFRNONAA >60 03/13/2019 1001   GFRNONAA 59 (L) 06/19/2018 0931   GFRAA >60 03/13/2019 1001   GFRAA >60 06/19/2018 0931    INo results found for: SPEP, UPEP  Lab Results  Component Value Date   WBC 3.3 (L) 03/13/2019   NEUTROABS 1.6 (L) 03/13/2019   HGB 10.0 (L) 03/13/2019   HCT 32.7 (L) 03/13/2019   MCV 93.2 03/13/2019   PLT 319 03/13/2019      Chemistry      Component Value Date/Time   NA 137 03/13/2019 1001   NA 140 02/11/2017 0755   K 4.4 03/13/2019 1001   K 4.4 02/11/2017 0755   CL 104 03/13/2019 1001   CO2  24 03/13/2019 1001   CO2 26 02/11/2017 0755   BUN 22 (H) 03/13/2019 1001   BUN 7.9 02/11/2017 0755   CREATININE 0.85 03/13/2019 1001   CREATININE 1.06 (H) 06/19/2018 0931   CREATININE 0.7 02/11/2017 0755      Component Value Date/Time   CALCIUM 9.2 03/13/2019 1001   CALCIUM 9.6 02/11/2017 0755   ALKPHOS 67 03/13/2019 1001   ALKPHOS 58 02/11/2017 0755   AST 18 03/13/2019 1001   AST 22 06/19/2018 0931   AST 27 02/11/2017 0755   ALT 10 03/13/2019 1001   ALT 13 06/19/2018 0931   ALT 24 02/11/2017 0755   BILITOT 0.3 03/13/2019 1001   BILITOT 0.2 (L) 06/19/2018 0931   BILITOT 0.43 02/11/2017 0755       No results found for: LABCA2  No components found for: LABCA125  No results for input(s): INR in the last 168 hours.  Urinalysis    Component Value Date/Time     COLORURINE STRAW (A) 11/22/2017 0940   APPEARANCEUR CLEAR 11/22/2017 0940   LABSPEC 1.002 (L) 11/22/2017 0940   LABSPEC 1.005 10/09/2015 1239   PHURINE 7.0 11/22/2017 0940   GLUCOSEU NEGATIVE 11/22/2017 0940   GLUCOSEU Negative 10/09/2015 1239   HGBUR NEGATIVE 11/22/2017 0940   BILIRUBINUR NEGATIVE 11/22/2017 0940   BILIRUBINUR Negative 10/09/2015 1239   KETONESUR NEGATIVE 11/22/2017 0940   PROTEINUR NEGATIVE 03/13/2019 1115   UROBILINOGEN 0.2 10/09/2015 1239   NITRITE NEGATIVE 11/22/2017 0940   LEUKOCYTESUR NEGATIVE 11/22/2017 0940   LEUKOCYTESUR Negative 10/09/2015 1239    STUDIES: No results found.   ELIGIBLE FOR AVAILABLE RESEARCH PROTOCOL:  no  ASSESSMENT: 55 y.o. BRCA negative [place] woman status post radical tumor debulking with optimal cytoreduction (R0) 03/27/2015 for a stage IIIC, high-grade right fallopian tube carcinoma  (a) baseline CA-125 was 1226.  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)  (c) tumor is PD-L1 negative (02/24/2016)  (d) tumor is strongly estrogen receptor positive, progesterone receptor negative (02/24/2016)  (1) adjuvant chemotherapy consisted of carboplatin and paclitaxel for 6 doses, begun 04/14/2015, completed 08/11/2015  (a) paclitaxel was omitted from cycle 5 and dose reduced on cycle 6 because of neuropathy   (b) a "make up" dose of paclitaxel was given 08/25/2015  (c) last carboplatin dose was 08/11/2015  (d) CA-125 normalized by 06/16/2015   (2) FIRST RECURRENCE: January 2018  (a) CA-125 rise beginning November 2017 led to CT scans and PET scans December 2017, all negative  (b) exploratory laparotomy 02/24/2016 showed pathologically confirmed recurrence, with miliary disease involving all examined surfaces  (c) port-site metastases noted 03/08/2016  (d) the  January 2018 tumor sample was tested for the estrogen receptor and he was 80 percent positive with strong staining, progesterone receptor negative  (3) carboplatin/liposomal doxorubicin started 03/05/2016, repeated every three weeks x4, completed 06/04/2016.  (a) cycle 3 delayed one week because of neutropenia; OnPro added  (b) CA 125 normalized after cycle 3  (4) RUCAPARIB maintenance started 07/02/2016  (a) restaging 10/01/2016: Normal CA 125, negative CT of the abdomen and pelvis  (b) labs 11/24/2016 shows a rise in her CA 125-43.4, with continuing rise thereafter  (c) rucaparib discontinued October 2018 with rising CA 125  (5) anastrozole started December 23, 2016-stopped after a couple of weeks with poor tolerance  SECOND RECURRENCE: (6) Gemcitabine/Bevacizumab started on 02/04/2017  (a) gemcitabine omitted cycle  4 because of intercurrent infection  (b) gemcitabine resumed with cycle 5, with intervening rise in  Ca 125  (c) repeat CT scan of the abdomen and pelvis on 05/26/2017 does not confirm obvious disease progression  (d) cycle 5 of gemcitabine/bevacizumab delayed 1 week   (e) gemcitabine/ bevacizumab discontinued after 6 cycles, with rising CA 125 (last dose 06/24/2017)  (7) [facility] one testing did not show a high mutation burden and the microsatellite status was stable.  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  These were not immediately actionable  (8) Abraxane started 07/12/2017, given weekly or weeks 1 and 2 of each 3 weeks cycle, last dose 11/15/2017 (5 cycles)  (a) cycle 2 started 08/09/2017, will be day 1 day 8 only  (b) cycle 3 scheduled to start 09/13/2017, day 1 day 8 at patient request  (c) CT of the abdomen and pelvis 11/28/2017 shows no measurable disease  (d) PET scan 01/25/2018 and MRI of the pelvis 02/03/2018 show no measurable disease  (e) patient has symptomatic ascites and a rapidly rising Ca1 25  (9) started cisplatin/gemcitabine days 1 and 8 of  each 21-day cycle on 02/18/2018  (a) switched to carboplatin/ gemcitabine 07/17/2018 due to neuropathy  (b) carbo/gemzar changed to Q14 days 07/24/2018 due to neutropenia  (c) considered niraparib/Zejula 100 mg, 2 tabs daily at the completion of chemotherapy  (10) associated problems  (a) anemia due to chemotherapy: On Aranesp  (b) 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 10/25/18 Consenting for AMV-564 clinical trial 21. 11/13/18 AMV-564 initiation 75 mcg dose 22. 10/8 Dropped down to 50 mcg dose (I think that date is correct) 23. 10/19-21/20 Tx held due to Neutropenia 24. 12/06/18 Given Neulasta 25. 12/07/18 AMV-564 resumed 26. 01/03/19 CT C/A/P 10% RECIST progression 27. 01/15/19 Increased dose to 75 mcg/daily 28. 01/17/19 Thoracentesis with cytology c/w [place] primary  29. Paracentesis 02/05/2019: cytology c/w adenocarcinoma  (12) schedule of thoracenteses:  (a) left 01/17/2019 [facility])  (a) left 02/06/2019 [facility])  (13) schedule of paracenteses:  (a) 01/30/2019 ([place])  (b) 02/06/2019 ([place])  (c) 02/18/2018 ([place])  (14) abraxane started 02/20/2019, repeated days 1 and 8 of every 21 day cycle  (a) bevacizumab/Avastin started 02/20/2019 and repeated every 21 days  PLAN: Tiffeny is doing well today.  She continues on treatment with Abraxane and Bevacizumab with good tolerance.  She says her symptoms are much improved since starting treatment.  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  This is stable and no worse, however she is working with acupuncture to see if this will help.  She will continue this.  I will look into data so we can write a letter to her insurance company.  Ellina needs a urine protein prior to each bevacizumab.  I placed an order for these.  We discussed her mild headache, and that is intermittent and manageable.  This is likely  secondary to the Bevacizumab.    Elizabeht will return in 1 week for day 8 of therapy, and in 3 weeks for cycle 3.  We are monitoring her tumor markers, which are not yet back and she does not want to know about them unless they are improved.  She was recommended to continue with the appropriate pandemic precautions. She knows to call for any questions that may arise between now and her next appointment.  We are happy to see her sooner if  needed.  Total time this encounter: 30 minutes  Wilber Bihari, NP 03/13/19 11:53 AM Medical Oncology and Hematology [facility] [place], [place] [address] Tel. [phone]    Fax. [phone]

## 2019-03-14 ENCOUNTER — Telehealth: Payer: Self-pay | Admitting: Adult Health

## 2019-03-14 LAB — CA 125: Cancer Antigen (CA) 125: 4105 U/mL — ABNORMAL HIGH (ref 0.0–38.1)

## 2019-03-14 NOTE — Telephone Encounter (Signed)
I left a patient a message regarding 2/16 time change

## 2019-03-20 ENCOUNTER — Other Ambulatory Visit: Payer: Self-pay | Admitting: Oncology

## 2019-03-20 ENCOUNTER — Inpatient Hospital Stay: Payer: 59 | Attending: Oncology

## 2019-03-20 ENCOUNTER — Inpatient Hospital Stay: Payer: 59

## 2019-03-20 ENCOUNTER — Other Ambulatory Visit: Payer: Self-pay

## 2019-03-20 VITALS — BP 119/69 | HR 65 | Temp 98.4°F | Resp 18

## 2019-03-20 DIAGNOSIS — Z87442 Personal history of urinary calculi: Secondary | ICD-10-CM | POA: Insufficient documentation

## 2019-03-20 DIAGNOSIS — Z7189 Other specified counseling: Secondary | ICD-10-CM

## 2019-03-20 DIAGNOSIS — Z5111 Encounter for antineoplastic chemotherapy: Secondary | ICD-10-CM | POA: Diagnosis present

## 2019-03-20 DIAGNOSIS — T451X5A Adverse effect of antineoplastic and immunosuppressive drugs, initial encounter: Secondary | ICD-10-CM | POA: Diagnosis not present

## 2019-03-20 DIAGNOSIS — Z9079 Acquired absence of other genital organ(s): Secondary | ICD-10-CM | POA: Diagnosis not present

## 2019-03-20 DIAGNOSIS — Z79899 Other long term (current) drug therapy: Secondary | ICD-10-CM | POA: Diagnosis not present

## 2019-03-20 DIAGNOSIS — R188 Other ascites: Secondary | ICD-10-CM | POA: Insufficient documentation

## 2019-03-20 DIAGNOSIS — D6481 Anemia due to antineoplastic chemotherapy: Secondary | ICD-10-CM | POA: Insufficient documentation

## 2019-03-20 DIAGNOSIS — Z85828 Personal history of other malignant neoplasm of skin: Secondary | ICD-10-CM | POA: Diagnosis not present

## 2019-03-20 DIAGNOSIS — D701 Agranulocytosis secondary to cancer chemotherapy: Secondary | ICD-10-CM

## 2019-03-20 DIAGNOSIS — C786 Secondary malignant neoplasm of retroperitoneum and peritoneum: Secondary | ICD-10-CM

## 2019-03-20 DIAGNOSIS — Z5112 Encounter for antineoplastic immunotherapy: Secondary | ICD-10-CM | POA: Insufficient documentation

## 2019-03-20 DIAGNOSIS — D709 Neutropenia, unspecified: Secondary | ICD-10-CM | POA: Diagnosis not present

## 2019-03-20 DIAGNOSIS — C762 Malignant neoplasm of abdomen: Secondary | ICD-10-CM

## 2019-03-20 DIAGNOSIS — Z90722 Acquired absence of ovaries, bilateral: Secondary | ICD-10-CM | POA: Insufficient documentation

## 2019-03-20 DIAGNOSIS — R5383 Other fatigue: Secondary | ICD-10-CM | POA: Diagnosis not present

## 2019-03-20 DIAGNOSIS — C5701 Malignant neoplasm of right fallopian tube: Secondary | ICD-10-CM | POA: Insufficient documentation

## 2019-03-20 DIAGNOSIS — G620 Drug-induced polyneuropathy: Secondary | ICD-10-CM | POA: Diagnosis not present

## 2019-03-20 DIAGNOSIS — D702 Other drug-induced agranulocytosis: Secondary | ICD-10-CM

## 2019-03-20 DIAGNOSIS — Z95828 Presence of other vascular implants and grafts: Secondary | ICD-10-CM

## 2019-03-20 DIAGNOSIS — Z170 Estrogen receptor positive status [ER+]: Secondary | ICD-10-CM | POA: Insufficient documentation

## 2019-03-20 DIAGNOSIS — Z5189 Encounter for other specified aftercare: Secondary | ICD-10-CM | POA: Insufficient documentation

## 2019-03-20 DIAGNOSIS — Z882 Allergy status to sulfonamides status: Secondary | ICD-10-CM | POA: Diagnosis not present

## 2019-03-20 LAB — CBC WITH DIFFERENTIAL/PLATELET
Abs Immature Granulocytes: 0.01 10*3/uL (ref 0.00–0.07)
Basophils Absolute: 0 10*3/uL (ref 0.0–0.1)
Basophils Relative: 1 %
Eosinophils Absolute: 0.1 10*3/uL (ref 0.0–0.5)
Eosinophils Relative: 2 %
HCT: 32.2 % — ABNORMAL LOW (ref 36.0–46.0)
Hemoglobin: 9.9 g/dL — ABNORMAL LOW (ref 12.0–15.0)
Immature Granulocytes: 0 %
Lymphocytes Relative: 50 %
Lymphs Abs: 1.5 10*3/uL (ref 0.7–4.0)
MCH: 28 pg (ref 26.0–34.0)
MCHC: 30.7 g/dL (ref 30.0–36.0)
MCV: 91.2 fL (ref 80.0–100.0)
Monocytes Absolute: 0.2 10*3/uL (ref 0.1–1.0)
Monocytes Relative: 7 %
Neutro Abs: 1.1 10*3/uL — ABNORMAL LOW (ref 1.7–7.7)
Neutrophils Relative %: 40 %
Platelets: 266 10*3/uL (ref 150–400)
RBC: 3.53 MIL/uL — ABNORMAL LOW (ref 3.87–5.11)
RDW: 17.4 % — ABNORMAL HIGH (ref 11.5–15.5)
WBC: 2.9 10*3/uL — ABNORMAL LOW (ref 4.0–10.5)
nRBC: 0 % (ref 0.0–0.2)

## 2019-03-20 LAB — COMPREHENSIVE METABOLIC PANEL
ALT: 15 U/L (ref 0–44)
AST: 21 U/L (ref 15–41)
Albumin: 4 g/dL (ref 3.5–5.0)
Alkaline Phosphatase: 65 U/L (ref 38–126)
Anion gap: 9 (ref 5–15)
BUN: 21 mg/dL — ABNORMAL HIGH (ref 6–20)
CO2: 25 mmol/L (ref 22–32)
Calcium: 9.1 mg/dL (ref 8.9–10.3)
Chloride: 103 mmol/L (ref 98–111)
Creatinine, Ser: 0.91 mg/dL (ref 0.44–1.00)
GFR calc Af Amer: >60 mL/min (ref >60)
GFR calc non Af Amer: >60 mL/min (ref >60)
Glucose, Bld: 91 mg/dL (ref 70–99)
Potassium: 4.5 mmol/L (ref 3.5–5.1)
Sodium: 137 mmol/L (ref 135–145)
Total Bilirubin: 0.3 mg/dL (ref 0.3–1.2)
Total Protein: 7.7 g/dL (ref 6.5–8.1)

## 2019-03-20 LAB — FERRITIN: Ferritin: 238 ng/mL (ref 11–307)

## 2019-03-20 MED ORDER — PACLITAXEL PROTEIN-BOUND CHEMO INJECTION 100 MG
100.0000 mg/m2 | Freq: Once | INTRAVENOUS | Status: AC
  Administered 2019-03-20: 12:00:00 150 mg via INTRAVENOUS
  Filled 2019-03-20: qty 30

## 2019-03-20 MED ORDER — ONDANSETRON HCL 8 MG PO TABS
ORAL_TABLET | ORAL | Status: AC
  Filled 2019-03-20: qty 1

## 2019-03-20 MED ORDER — EPOETIN ALFA-EPBX 40000 UNIT/ML IJ SOLN
INTRAMUSCULAR | Status: AC
  Filled 2019-03-20: qty 1

## 2019-03-20 MED ORDER — SODIUM CHLORIDE 0.9 % IV SOLN
Freq: Once | INTRAVENOUS | Status: AC
  Filled 2019-03-20: qty 250

## 2019-03-20 MED ORDER — SODIUM CHLORIDE 0.9% FLUSH
10.0000 mL | INTRAVENOUS | Status: DC | PRN
  Administered 2019-03-20: 10 mL
  Filled 2019-03-20: qty 10

## 2019-03-20 MED ORDER — HEPARIN SOD (PORK) LOCK FLUSH 100 UNIT/ML IV SOLN
500.0000 [IU] | Freq: Once | INTRAVENOUS | Status: DC | PRN
  Filled 2019-03-20: qty 5

## 2019-03-20 MED ORDER — ONDANSETRON HCL 8 MG PO TABS
8.0000 mg | ORAL_TABLET | Freq: Once | ORAL | Status: AC
  Administered 2019-03-20: 8 mg via ORAL

## 2019-03-20 MED ORDER — HEPARIN SOD (PORK) LOCK FLUSH 100 UNIT/ML IV SOLN
500.0000 [IU] | Freq: Once | INTRAVENOUS | Status: AC | PRN
  Administered 2019-03-20: 500 [IU]
  Filled 2019-03-20: qty 5

## 2019-03-20 MED ORDER — ALTEPLASE 2 MG IJ SOLR
2.0000 mg | Freq: Once | INTRAMUSCULAR | Status: DC | PRN
  Filled 2019-03-20: qty 2

## 2019-03-20 MED ORDER — EPOETIN ALFA-EPBX 40000 UNIT/ML IJ SOLN
40000.0000 [IU] | Freq: Once | INTRAMUSCULAR | Status: AC
  Administered 2019-03-20: 40000 [IU] via SUBCUTANEOUS

## 2019-03-20 NOTE — Patient Instructions (Signed)

## 2019-03-20 NOTE — Progress Notes (Signed)
OK to treat with labs and ANC today per MD Magrinat

## 2019-03-20 NOTE — Patient Instructions (Signed)
[facility] Discharge Instructions for Patients Receiving Chemotherapy  Today you received the following chemotherapy agents :  Abraxane.  To help prevent nausea and vomiting after your treatment, we encourage you to take your nausea medication as prescribed.   If you develop nausea and vomiting that is not controlled by your nausea medication, call the clinic.   BELOW ARE SYMPTOMS THAT SHOULD BE REPORTED IMMEDIATELY:  *FEVER GREATER THAN 100.5 F  *CHILLS WITH OR WITHOUT FEVER  NAUSEA AND VOMITING THAT IS NOT CONTROLLED WITH YOUR NAUSEA MEDICATION  *UNUSUAL SHORTNESS OF BREATH  *UNUSUAL BRUISING OR BLEEDING  TENDERNESS IN MOUTH AND THROAT WITH OR WITHOUT PRESENCE OF ULCERS  *URINARY PROBLEMS  *BOWEL PROBLEMS  UNUSUAL RASH Items with * indicate a potential emergency and should be followed up as soon as possible.  Feel free to call the clinic should you have any questions or concerns. The clinic phone number is [phone].  Please show the [facility] at check-in to the Emergency Department and triage nurse.   Epoetin Alfa injection What is this medicine? EPOETIN ALFA (e POE e tin AL fa) helps your body make more red blood cells. This medicine is used to treat anemia caused by chronic kidney disease, cancer chemotherapy, or HIV-therapy. It may also be used before surgery if you have anemia. This medicine may be used for other purposes; ask your health care provider or pharmacist if you have questions. COMMON BRAND NAME(S): Epogen, Procrit, Retacrit What should I tell my health care provider before I take this medicine? They need to know if you have any of these conditions:  cancer  heart disease  high blood pressure  history of blood clots  history of stroke  low levels of folate, iron, or vitamin B12 in the blood  seizures  an unusual or allergic reaction to erythropoietin, albumin, benzyl alcohol, hamster proteins, other medicines,  foods, dyes, or preservatives  pregnant or trying to get pregnant  breast-feeding How should I use this medicine? This medicine is for injection into a vein or under the skin. It is usually given by a health care professional in a hospital or clinic setting. If you get this medicine at home, you will be taught how to prepare and give this medicine. Use exactly as directed. Take your medicine at regular intervals. Do not take your medicine more often than directed. It is important that you put your used needles and syringes in a special sharps container. Do not put them in a trash can. If you do not have a sharps container, call your pharmacist or healthcare provider to get one. A special MedGuide will be given to you by the pharmacist with each prescription and refill. Be sure to read this information carefully each time. Talk to your pediatrician regarding the use of this medicine in children. While this drug may be prescribed for selected conditions, precautions do apply. Overdosage: If you think you have taken too much of this medicine contact a poison control center or emergency room at once. NOTE: This medicine is only for you. Do not share this medicine with others. What if I miss a dose? If you miss a dose, take it as soon as you can. If it is almost time for your next dose, take only that dose. Do not take double or extra doses. What may interact with this medicine? Interactions have not been studied. This list may not describe all possible interactions. Give your health care provider a list  of all the medicines, herbs, non-prescription drugs, or dietary supplements you use. Also tell them if you smoke, drink alcohol, or use illegal drugs. Some items may interact with your medicine. What should I watch for while using this medicine? Your condition will be monitored carefully while you are receiving this medicine. You may need blood work done while you are taking this medicine. This  medicine may cause a decrease in vitamin B6. You should make sure that you get enough vitamin B6 while you are taking this medicine. Discuss the foods you eat and the vitamins you take with your health care professional. What side effects may I notice from receiving this medicine? Side effects that you should report to your doctor or health care professional as soon as possible:  allergic reactions like skin rash, itching or hives, swelling of the face, lips, or tongue  seizures  signs and symptoms of a blood clot such as breathing problems; changes in vision; chest pain; severe, sudden headache; pain, swelling, warmth in the leg; trouble speaking; sudden numbness or weakness of the face, arm or leg  signs and symptoms of a stroke like changes in vision; confusion; trouble speaking or understanding; severe headaches; sudden numbness or weakness of the face, arm or leg; trouble walking; dizziness; loss of balance or coordination Side effects that usually do not require medical attention (report to your doctor or health care professional if they continue or are bothersome):  chills  cough  dizziness  fever  headaches  joint pain  muscle cramps  muscle pain  nausea, vomiting  pain, redness, or irritation at site where injected This list may not describe all possible side effects. Call your doctor for medical advice about side effects. You may report side effects to FDA at 1-800-FDA-1088. Where should I keep my medicine? Keep out of the reach of children. Store in a refrigerator between 2 and 8 degrees C (36 and 46 degrees F). Do not freeze or shake. Throw away any unused portion if using a single-dose vial. Multi-dose vials can be kept in the refrigerator for up to 21 days after the initial dose. Throw away unused medicine. NOTE: This sheet is a summary. It may not cover all possible information. If you have questions about this medicine, talk to your doctor, pharmacist, or health  care provider.  2020 Elsevier/Gold Standard (2016-09-10 08:35:19)

## 2019-03-20 NOTE — Progress Notes (Signed)
Kathryn Ramsey's ANC is down to 1.1.  She is going to be neutropenic and this may delay further treatments.  We are going to see if we can give her Granix for the next 3 days.

## 2019-03-21 ENCOUNTER — Other Ambulatory Visit: Payer: Self-pay

## 2019-03-21 ENCOUNTER — Inpatient Hospital Stay: Payer: 59

## 2019-03-21 VITALS — BP 118/68 | HR 68 | Temp 98.2°F | Resp 18

## 2019-03-21 DIAGNOSIS — D702 Other drug-induced agranulocytosis: Secondary | ICD-10-CM

## 2019-03-21 DIAGNOSIS — C762 Malignant neoplasm of abdomen: Secondary | ICD-10-CM

## 2019-03-21 DIAGNOSIS — Z95828 Presence of other vascular implants and grafts: Secondary | ICD-10-CM

## 2019-03-21 DIAGNOSIS — Z5112 Encounter for antineoplastic immunotherapy: Secondary | ICD-10-CM | POA: Diagnosis not present

## 2019-03-21 DIAGNOSIS — D701 Agranulocytosis secondary to cancer chemotherapy: Secondary | ICD-10-CM

## 2019-03-21 MED ORDER — FILGRASTIM-SNDZ 300 MCG/0.5ML IJ SOSY
PREFILLED_SYRINGE | INTRAMUSCULAR | Status: AC
  Filled 2019-03-21: qty 0.5

## 2019-03-21 MED ORDER — FILGRASTIM-SNDZ 300 MCG/0.5ML IJ SOSY
300.0000 ug | PREFILLED_SYRINGE | Freq: Once | INTRAMUSCULAR | Status: AC
  Administered 2019-03-21: 11:00:00 300 ug via SUBCUTANEOUS

## 2019-03-21 NOTE — Patient Instructions (Signed)

## 2019-03-21 NOTE — Progress Notes (Signed)
Spoke w/ Dr. Jana Hakim, okay to try zarxio again given that insurance will not cover granix anymore. Reaction to zarxio in the past were severe body aches and pains. Denies shortness of breath or any allergic-like symptoms. Patient will be educated to take loratadine 10mg  on days around her G-CSF injection and to call with any issues that arise.   Demetrius Charity, PharmD, [facility] Phone: [phone] 03/21/2019

## 2019-03-22 ENCOUNTER — Other Ambulatory Visit: Payer: Self-pay

## 2019-03-22 ENCOUNTER — Encounter: Payer: Self-pay | Admitting: Oncology

## 2019-03-22 ENCOUNTER — Telehealth: Payer: Self-pay | Admitting: *Deleted

## 2019-03-22 ENCOUNTER — Other Ambulatory Visit: Payer: Self-pay | Admitting: *Deleted

## 2019-03-22 ENCOUNTER — Inpatient Hospital Stay: Payer: 59

## 2019-03-22 VITALS — BP 116/80 | HR 63 | Temp 98.2°F | Resp 18

## 2019-03-22 DIAGNOSIS — D702 Other drug-induced agranulocytosis: Secondary | ICD-10-CM

## 2019-03-22 DIAGNOSIS — C762 Malignant neoplasm of abdomen: Secondary | ICD-10-CM

## 2019-03-22 DIAGNOSIS — Z5112 Encounter for antineoplastic immunotherapy: Secondary | ICD-10-CM | POA: Diagnosis not present

## 2019-03-22 DIAGNOSIS — D701 Agranulocytosis secondary to cancer chemotherapy: Secondary | ICD-10-CM

## 2019-03-22 DIAGNOSIS — Z95828 Presence of other vascular implants and grafts: Secondary | ICD-10-CM

## 2019-03-22 MED ORDER — FILGRASTIM-SNDZ 300 MCG/0.5ML IJ SOSY
PREFILLED_SYRINGE | INTRAMUSCULAR | Status: AC
  Filled 2019-03-22: qty 0.5

## 2019-03-22 MED ORDER — GABAPENTIN 100 MG PO CAPS: ORAL_CAPSULE | ORAL | 1 refills | Status: DC

## 2019-03-22 MED ORDER — GABAPENTIN 100 MG PO CAPS: ORAL_CAPSULE | ORAL | 1 refills | Status: AC

## 2019-03-22 MED ORDER — FILGRASTIM-SNDZ 300 MCG/0.5ML IJ SOSY
300.0000 ug | PREFILLED_SYRINGE | Freq: Once | INTRAMUSCULAR | Status: AC
  Administered 2019-03-22: 300 ug via SUBCUTANEOUS

## 2019-03-22 MED ORDER — SERTRALINE HCL 50 MG PO TABS: 75.0000 mg | ORAL_TABLET | Freq: Every day | ORAL | 3 refills | Status: DC

## 2019-03-22 NOTE — Progress Notes (Signed)
Patient tolerated zarxio injection fairly well. She did still have some body aches and pains. No s/sx of allergic reaction or shortness of breath. She felt okay to get the injection yet today and tomorrow. We discussed the possibility of trying a different option for next cycle if needed. We will look into this with her insurance company.   Demetrius Charity, PharmD, [facility] Phone: [phone] 03/22/2019

## 2019-03-22 NOTE — Patient Instructions (Signed)

## 2019-03-22 NOTE — Telephone Encounter (Signed)
This RN spoke with the patient per her call- stating need for increased dispense amount for recommended 300 mg of gabapentin at night vs 200 mg. Pt is requesting the 100 mg tablets.  She also states she has been recommended by her counselor to increase her antidepressant to 75 mg from 50 mg.  This RN sent refills reflecting the above to [facility] ([place]

## 2019-03-23 ENCOUNTER — Inpatient Hospital Stay: Payer: 59

## 2019-03-23 ENCOUNTER — Other Ambulatory Visit: Payer: Self-pay

## 2019-03-23 VITALS — BP 106/67 | HR 62 | Temp 98.0°F | Resp 18

## 2019-03-23 DIAGNOSIS — D702 Other drug-induced agranulocytosis: Secondary | ICD-10-CM

## 2019-03-23 DIAGNOSIS — Z95828 Presence of other vascular implants and grafts: Secondary | ICD-10-CM

## 2019-03-23 DIAGNOSIS — Z5112 Encounter for antineoplastic immunotherapy: Secondary | ICD-10-CM | POA: Diagnosis not present

## 2019-03-23 DIAGNOSIS — D701 Agranulocytosis secondary to cancer chemotherapy: Secondary | ICD-10-CM

## 2019-03-23 DIAGNOSIS — C762 Malignant neoplasm of abdomen: Secondary | ICD-10-CM

## 2019-03-23 DIAGNOSIS — T451X5A Adverse effect of antineoplastic and immunosuppressive drugs, initial encounter: Secondary | ICD-10-CM

## 2019-03-23 MED ORDER — FILGRASTIM-SNDZ 300 MCG/0.5ML IJ SOSY
300.0000 ug | PREFILLED_SYRINGE | Freq: Once | INTRAMUSCULAR | Status: AC
  Administered 2019-03-23: 300 ug via SUBCUTANEOUS

## 2019-03-23 MED ORDER — FILGRASTIM-SNDZ 300 MCG/0.5ML IJ SOSY
PREFILLED_SYRINGE | INTRAMUSCULAR | Status: AC
  Filled 2019-03-23: qty 0.5

## 2019-03-23 NOTE — Patient Instructions (Signed)

## 2019-03-24 ENCOUNTER — Other Ambulatory Visit: Payer: Self-pay | Admitting: Oncology

## 2019-04-03 ENCOUNTER — Inpatient Hospital Stay (HOSPITAL_BASED_OUTPATIENT_CLINIC_OR_DEPARTMENT_OTHER): Payer: 59 | Admitting: Adult Health

## 2019-04-03 ENCOUNTER — Other Ambulatory Visit: Payer: Self-pay

## 2019-04-03 ENCOUNTER — Inpatient Hospital Stay: Payer: 59

## 2019-04-03 ENCOUNTER — Ambulatory Visit: Payer: 59

## 2019-04-03 ENCOUNTER — Other Ambulatory Visit: Payer: 59

## 2019-04-03 VITALS — BP 117/79 | HR 64 | Temp 97.8°F | Resp 17 | Ht 63.0 in | Wt 115.1 lb

## 2019-04-03 DIAGNOSIS — T451X5A Adverse effect of antineoplastic and immunosuppressive drugs, initial encounter: Secondary | ICD-10-CM

## 2019-04-03 DIAGNOSIS — C762 Malignant neoplasm of abdomen: Secondary | ICD-10-CM

## 2019-04-03 DIAGNOSIS — Z7189 Other specified counseling: Secondary | ICD-10-CM

## 2019-04-03 DIAGNOSIS — D701 Agranulocytosis secondary to cancer chemotherapy: Secondary | ICD-10-CM

## 2019-04-03 DIAGNOSIS — C5701 Malignant neoplasm of right fallopian tube: Secondary | ICD-10-CM

## 2019-04-03 DIAGNOSIS — C786 Secondary malignant neoplasm of retroperitoneum and peritoneum: Secondary | ICD-10-CM

## 2019-04-03 DIAGNOSIS — G620 Drug-induced polyneuropathy: Secondary | ICD-10-CM

## 2019-04-03 DIAGNOSIS — Z5112 Encounter for antineoplastic immunotherapy: Secondary | ICD-10-CM | POA: Diagnosis not present

## 2019-04-03 LAB — CBC WITH DIFFERENTIAL (CANCER CENTER ONLY)
Abs Immature Granulocytes: 0.01 10*3/uL (ref 0.00–0.07)
Basophils Absolute: 0 10*3/uL (ref 0.0–0.1)
Basophils Relative: 1 %
Eosinophils Absolute: 0 10*3/uL (ref 0.0–0.5)
Eosinophils Relative: 1 %
HCT: 34.2 % — ABNORMAL LOW (ref 36.0–46.0)
Hemoglobin: 10.7 g/dL — ABNORMAL LOW (ref 12.0–15.0)
Immature Granulocytes: 0 %
Lymphocytes Relative: 38 %
Lymphs Abs: 1.5 10*3/uL (ref 0.7–4.0)
MCH: 28.8 pg (ref 26.0–34.0)
MCHC: 31.3 g/dL (ref 30.0–36.0)
MCV: 92.2 fL (ref 80.0–100.0)
Monocytes Absolute: 0.3 10*3/uL (ref 0.1–1.0)
Monocytes Relative: 9 %
Neutro Abs: 2 10*3/uL (ref 1.7–7.7)
Neutrophils Relative %: 51 %
Platelet Count: 306 10*3/uL (ref 150–400)
RBC: 3.71 MIL/uL — ABNORMAL LOW (ref 3.87–5.11)
RDW: 18.4 % — ABNORMAL HIGH (ref 11.5–15.5)
WBC Count: 3.9 10*3/uL — ABNORMAL LOW (ref 4.0–10.5)
nRBC: 0 % (ref 0.0–0.2)

## 2019-04-03 LAB — COMPREHENSIVE METABOLIC PANEL
ALT: 10 U/L (ref 0–44)
AST: 16 U/L (ref 15–41)
Albumin: 3.9 g/dL (ref 3.5–5.0)
Alkaline Phosphatase: 63 U/L (ref 38–126)
Anion gap: 8 (ref 5–15)
BUN: 15 mg/dL (ref 6–20)
CO2: 23 mmol/L (ref 22–32)
Calcium: 8.8 mg/dL — ABNORMAL LOW (ref 8.9–10.3)
Chloride: 106 mmol/L (ref 98–111)
Creatinine, Ser: 0.87 mg/dL (ref 0.44–1.00)
GFR calc Af Amer: >60 mL/min (ref >60)
GFR calc non Af Amer: >60 mL/min (ref >60)
Glucose, Bld: 90 mg/dL (ref 70–99)
Potassium: 4.4 mmol/L (ref 3.5–5.1)
Sodium: 137 mmol/L (ref 135–145)
Total Bilirubin: 0.4 mg/dL (ref 0.3–1.2)
Total Protein: 7.5 g/dL (ref 6.5–8.1)

## 2019-04-03 LAB — TOTAL PROTEIN, URINE DIPSTICK: Protein, ur: NEGATIVE mg/dL

## 2019-04-03 MED ORDER — PACLITAXEL PROTEIN-BOUND CHEMO INJECTION 100 MG
100.0000 mg/m2 | Freq: Once | INTRAVENOUS | Status: AC
  Administered 2019-04-03: 150 mg via INTRAVENOUS
  Filled 2019-04-03: qty 30

## 2019-04-03 MED ORDER — SODIUM CHLORIDE 0.9 % IV SOLN
15.0000 mg/kg | Freq: Once | INTRAVENOUS | Status: AC
  Administered 2019-04-03: 800 mg via INTRAVENOUS
  Filled 2019-04-03: qty 32

## 2019-04-03 MED ORDER — SODIUM CHLORIDE 0.9 % IV SOLN
Freq: Once | INTRAVENOUS | Status: AC
  Filled 2019-04-03: qty 250

## 2019-04-03 MED ORDER — ONDANSETRON HCL 4 MG/2ML IJ SOLN
INTRAMUSCULAR | Status: AC
  Filled 2019-04-03: qty 4

## 2019-04-03 MED ORDER — SODIUM CHLORIDE 0.9% FLUSH
10.0000 mL | INTRAVENOUS | Status: DC | PRN
  Administered 2019-04-03: 10 mL
  Filled 2019-04-03: qty 10

## 2019-04-03 MED ORDER — ONDANSETRON HCL 8 MG PO TABS
8.0000 mg | ORAL_TABLET | Freq: Once | ORAL | Status: AC
  Administered 2019-04-03: 8 mg via ORAL

## 2019-04-03 MED ORDER — ONDANSETRON HCL 8 MG PO TABS
ORAL_TABLET | ORAL | Status: AC
  Filled 2019-04-03: qty 1

## 2019-04-03 MED ORDER — HEPARIN SOD (PORK) LOCK FLUSH 100 UNIT/ML IV SOLN
500.0000 [IU] | Freq: Once | INTRAVENOUS | Status: AC | PRN
  Administered 2019-04-03: 500 [IU]
  Filled 2019-04-03: qty 5

## 2019-04-03 NOTE — Progress Notes (Signed)
[place]  Telephone:([phone]) [phone] [phone]) [phone]    ID: Kathryn Ramsey DOB: 07/18/1965  MR#: [id number]  NMM#:[id number]  Patient Care Team: Harlan Stains, MD as PCP - General (Family Medicine) [name], MD as Consulting Physician (Obstetrics and Gynecology) Juanita Craver, MD as Consulting Physician (Gastroenterology) Servando Salina, MD as Consulting Physician (Obstetrics and Gynecology) Nickie Retort, MD as Consulting Physician (Urology) Gillis Ends, MD as Referring Physician (Obstetrics and Gynecology) Mettu, Baltazar Apo, MD as Referring Physician (Oncology) OTHER MD: Festus Aloe [phone]) Floyde Parkins  NOTE: Patient does not want to be informed of her CA-125.  CHIEF COMPLAINT:  ovarian cancer  CURRENT TREATMENT: Abraxane, bevacizumab   INTERVAL HISTORY: [name] returns today for follow-up and treatment of her recurrent ovarian cancer accompanied by her husband.  She is receiving treatment with Abraxane and Bevacizumab given on day 1 of a 21 day cycle and Abraxane alone given on day 8 of a 21 day cycle.  Today is cycle 3 day 1 of treatment.    REVIEW OF SYSTEMS: Taiesha is doing well today.  She continues on treatment.  Her biggest issue remains her neuropathy.  She is undergoing acupuncture for this and notes that it is improved.    Mardella initially noted an improvement in her ascites, however she notes the ascites is slightly worse today, though still tremendously improved as compared to how she felt initially.    She had to receive Zarxio (Neupogen biosimilar) due to chemotherapy induced neutropenia on 2/3, 2/4, and 2/5.  She had body aches, fever, chills, headaches afterward, and mild itching.  She has really has a difficult time tolerating thsi injection as compared to Granix and is asking to receive granix instead in the future, if insurance would graciously allow.    Ciclaly denies any fever, chills, chest pain,  palpitations, cough, shortness of breath, chest pain, palpitations, bowel/bladder changes, nausea, vomiting, vision issues, headaches, or any other concerns.  A detailed ROS was otherwise non contributory.     HISTORY OF PRESENT ILLNESS: From Dr. Serita Grit original intake note 03/17/2015:  "Kathryn Ramsey is a very pleasant G4P4 who is seen in consultation at the request of Dr Collene Mares and Dr Garwin Brothers for peritoneal carcinomatosis. The patient has a history of a workup by urology for gross hematuria which included a pelvic examine was suggested for extrinsic mass effect on the bladder on cystoscopy. Cystoscopy took place on in December 2016. The patient denies abdominal pain bloating early satiety or abdominal distention.  On 08/03/2015 at [facility] she underwent a CT scan of the abdomen and pelvis as ordered by Dr Baruch Gouty. This revealed a 1.4 cm low attenuation lesion on the posterior right hepatic lobe suggestive of a hemangioma. Status post hysterectomy. No ovarian masses. Moderate ascites. Omental caking seen in the lateral left abdomen and pelvis. Peritoneal nodularity in the left lateral pelvic cul-de-sac. No gross extrinsic compression on the bladder was identified on imaging.  The patient was then seen by her gastroenterologist, Dr. Collene Mares, who performed a colonoscopy which was unremarkable.  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 She was treated adjuvantly with carboplatin and paclitaxel, as detailed below. Her CA 125 dropped from  1226 on 03/20/2015 to 26.1 by 06/16/2015.  Her subsequent history is as detailed above.   PAST MEDICAL HISTORY: Past  Medical History:  Diagnosis Date  . Acute sensory neuropathy 06/26/2015  . Allergy   . Anemia   . Asthma    illness induced asthma  . Blood transfusion without reported diagnosis    at age 75 years old D/T surgery to femur being crushed  . 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 Heart murmur    pt, states had a "working heart murmur"  . History of kidney stones   . Neutropenia, drug-induced ([place]) 03/10/2018  . PONV (postoperative nausea and vomiting)   . Skin cancer     PAST SURGICAL HISTORY: Past Surgical History:  Procedure Laterality Date  . 3 laporscopic proceedures    . ABDOMINAL HYSTERECTOMY     2010  . CHOLECYSTECTOMY     2010  . DILATION AND CURETTAGE OF UTERUS     1997  . IR IMAGING GUIDED PORT INSERTION  03/08/2018  . LAPAROSCOPY N/A 03/27/2015   Procedure: DIAGNOSTIC LAPAROSCOPY ;  Surgeon: [name], MD;  Location: [facility];  Service: Gynecology;  Laterality: N/A;  . LAPAROSCOPY N/A 02/24/2016   Procedure: LAPAROSCOPY DIAGNOSTIC WITH PERITONEAL WASHINGS AND PERITONEAL BIOPSY;  Surgeon: [name], MD;  Location: [facility];  Service: Gynecology;  Laterality: N/A;  . LAPAROTOMY N/A 03/27/2015   Procedure: EXPLORATORY LAPAROTOMY, BILATERAL SALPINGO OOPHORECTOMY, OMENTECTOMY, RADICAL TUMOR DEBULKING;  Surgeon: [name], MD;  Location: [facility];  Service: Gynecology;  Laterality: N/A;  . sinus surgery 1994      FAMILY HISTORY Family History  Problem Relation Age of Onset  . Hypertension Father   . Skin cancer Father        nonmelanoma skin cancers in his late 82s  . Non-Hodgkin's lymphoma Maternal Aunt        dx. 47s; smoker  . Stroke Maternal Grandmother   . Other Mother        benign meningioma dx. early-mid-70s; hysterectomy in her late 5s for heavy periods - still has ovaries  . Other Son        one son with pre-cancerous skin findings  . Other Daughter        cysts on ovaries and hx of  heavy periods  . Other Sister        hysterectomy for cysts - still has ovaries  . Heart attack Maternal Grandfather   . Heart attack Paternal Grandfather   . Renal cancer Maternal Aunt 60       smoker  . Breast cancer Maternal Aunt 78  . Other Maternal Aunt        dx. benign brain tumor (meningioma) at 13; 2nd benign brain tumor in her late 58s; hx of radical hysterectomy at age 37  . Brain cancer Other        NOS tumor  The patient's parents are both living, in their late 2s as of January 2018. The patient has one brother, 3 sisters. There is no history of ovarian cancer in the family. One maternal aunt had breast and kidney cancer, another had non-Hodgkin's lymphoma.   GYNECOLOGIC HISTORY:  No LMP recorded. Patient has had a hysterectomy. 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 Her husband  [name] is a Engineer, maintenance (IT). Their children are 46, 47, 65 and 95 y/o as of JAN 2018. The oldest works as an Electrical engineer in the [employer] in [place]. The other 3 children live in [place], the youngest currently attending [employer]. The patient has no grandchildren. She attends [employer]   ADVANCED DIRECTIVES: In the absence of any documentation to the contrary, the patient's spouse is their HCPOA.    HEALTH MAINTENANCE: Social History   Tobacco Use  . Smoking status: Never Smoker  . Smokeless tobacco: Never Used  Substance Use Topics  . Alcohol use: Yes    Comment:  3 glasses wine or beer/week  . Drug use: No    Colonoscopy:2016  PAP: s/p hyst  Bone density:   Allergies  Allergen Reactions  . Bee Venom Shortness Of Breath    swelling  . Prochlorperazine Edisylate Anaphylaxis, Anxiety and Palpitations    Anaphylaxis, Anxiety and Palpitations   . Sulfa Antibiotics Shortness Of Breath    Vomit , diarrhea , hives  . Zarxio [Filgrastim]     Current Outpatient  Medications  Medication Sig Dispense Refill  . acetaminophen (TYLENOL) 500 MG tablet Take 2 tablets by mouth 3 (three) times daily as needed.    [name] acetaminophen-codeine (TYLENOL #3) 300-30 MG tablet Take 2 tablets by mouth 4 (four) times daily as needed. 60 tablet 0  . albuterol (VENTOLIN HFA) 108 (90 Base) MCG/ACT inhaler Inhale into the lungs.    . fluticasone (FLONASE) 50 MCG/ACT nasal spray Place 1 spray into both nostrils 2 (two) times daily. (Patient taking differently: Place 1 spray into both nostrils as needed. ) 16 g 2  . gabapentin (NEURONTIN) 100 MG capsule 200 mg bid with 300 mg qhs 630 capsule 1  . hydrocortisone cream 1 % Apply 1 application topically 3 (three) times daily.    [name] ibuprofen (ADVIL) 600 MG tablet Take 1 tablet by mouth 4 (four) times daily as needed.    . lidocaine-prilocaine (EMLA) cream APPLY TOPICALLY AS NEEDED. 30 g 0  . loratadine (CLARITIN) 10 MG tablet Take 10 mg by mouth as needed.     . magnesium oxide (MAG-OX) 400 (241.3 Mg) MG tablet Take 1 tablet (400 mg total) by mouth daily. 60 tablet 3  . Multiple Vitamins-Minerals (MULTIVITAMIN ADULT PO) Take 1 tablet by mouth daily.    . Omega 3 1000 MG CAPS Take 1 capsule by mouth daily.    [name] omeprazole (PRILOSEC) 40 MG capsule Take 1 capsule (40 mg total) by mouth daily. 30 capsule 5  . ondansetron (ZOFRAN) 8 MG tablet     . senna-docusate (SENOKOT-S) 8.6-50 MG tablet Take 1-2 tablets by mouth 2 (two) times daily as needed.    . sertraline (ZOLOFT) 50 MG tablet Take 1.5 tablets (75 mg total) by mouth daily. Increase dose to 75 mg daily 45 tablet 3  . traMADol (ULTRAM) 50 MG tablet Take 1 tablet (50 mg total) by mouth every 6 (six) hours as needed. 60 tablet 0  . valACYclovir (VALTREX) 1000 MG tablet Take 1 tablet (1,000 mg total) by mouth daily. 5 tablet 0  . VENTOLIN HFA 108 (90 Base) MCG/ACT inhaler     . vitamin B-12 (CYANOCOBALAMIN) 1000 MCG tablet Take 1 tablet by mouth daily.    . diphenhydrAMINE (BENADRYL)  50 MG capsule 50 mg Diphenhydramine (Benadryl) PO within 1 hour of the injection 1 capsule 0   No current facility-administered medications for this visit.   Facility-Administered Medications Ordered  in Other Visits  Medication Dose Route Frequency Provider Last Rate Last Admin  . heparin lock flush 100 unit/mL  500 Units Intravenous Once Magrinat, Virgie Dad, MD      . sodium chloride flush (NS) 0.9 % injection 10 mL  10 mL Intravenous PRN Magrinat, Virgie Dad, MD        OBJECTIVE:   Vitals:   04/03/19 0955  BP: 117/79  Pulse: 64  Resp: 17  Temp: 97.8 F (36.6 C)  SpO2: 100%     Body mass index is 20.39 kg/m.   Filed Weights   04/03/19 0955  Weight: 115 lb 1.6 oz (52.2 kg)    ECOG FS:1 - Symptomatic but completely ambulatory GENERAL: Patient is a well appearing female in no acute distress HEENT:  Sclerae anicteric.  Mask in place.  Neck is supple.  NODES:  No cervical, supraclavicular, or axillary lymphadenopathy palpated.  BREAST EXAM:  Deferred. LUNGS:  Clear to auscultation bilaterally.  No wheezes or rhonchi. HEART:  Regular rate and rhythm. No murmur appreciated. ABDOMEN:  Soft, nontender.  Slightly distended.  Positive, normoactive bowel sounds. No organomegaly palpated. MSK:  No focal spinal tenderness to palpation. Full range of motion bilaterally in the upper extremities. EXTREMITIES:  No peripheral edema.   SKIN:  Clear with no obvious rashes or skin changes. No nail dyscrasia. NEURO:  Nonfocal. Well oriented.  Appropriate affect.     LAB RESULTS:  CMP     Component Value Date/Time   NA 137 04/03/2019 0930   NA 140 02/11/2017 0755   K 4.4 04/03/2019 0930   K 4.4 02/11/2017 0755   CL 106 04/03/2019 0930   CO2 23 04/03/2019 0930   CO2 26 02/11/2017 0755   GLUCOSE 90 04/03/2019 0930   GLUCOSE 93 02/11/2017 0755   BUN 15 04/03/2019 0930   BUN 7.9 02/11/2017 0755   CREATININE 0.87 04/03/2019 0930   CREATININE 1.06 (H) 06/19/2018 0931   CREATININE 0.7  02/11/2017 0755   CALCIUM 8.8 (L) 04/03/2019 0930   CALCIUM 9.6 02/11/2017 0755   PROT 7.5 04/03/2019 0930   PROT 7.5 02/11/2017 0755   ALBUMIN 3.9 04/03/2019 0930   ALBUMIN 4.4 02/11/2017 0755   AST 16 04/03/2019 0930   AST 22 06/19/2018 0931   AST 27 02/11/2017 0755   ALT 10 04/03/2019 0930   ALT 13 06/19/2018 0931   ALT 24 02/11/2017 0755   ALKPHOS 63 04/03/2019 0930   ALKPHOS 58 02/11/2017 0755   BILITOT 0.4 04/03/2019 0930   BILITOT 0.2 (L) 06/19/2018 0931   BILITOT 0.43 02/11/2017 0755   GFRNONAA >60 04/03/2019 0930   GFRNONAA 59 (L) 06/19/2018 0931   GFRAA >60 04/03/2019 0930   GFRAA >60 06/19/2018 0931    INo results found for: SPEP, UPEP  Lab Results  Component Value Date   WBC 3.9 (L) 04/03/2019   NEUTROABS 2.0 04/03/2019   HGB 10.7 (L) 04/03/2019   HCT 34.2 (L) 04/03/2019   MCV 92.2 04/03/2019   PLT 306 04/03/2019      Chemistry      Component Value Date/Time   NA 137 04/03/2019 0930   NA 140 02/11/2017 0755   K 4.4 04/03/2019 0930   K 4.4 02/11/2017 0755   CL 106 04/03/2019 0930   CO2 23 04/03/2019 0930   CO2 26 02/11/2017 0755   BUN 15 04/03/2019 0930   BUN 7.9 02/11/2017 0755   CREATININE 0.87 04/03/2019 0930   CREATININE 1.06 (H) 06/19/2018 2683  CREATININE 0.7 02/11/2017 0755      Component Value Date/Time   CALCIUM 8.8 (L) 04/03/2019 0930   CALCIUM 9.6 02/11/2017 0755   ALKPHOS 63 04/03/2019 0930   ALKPHOS 58 02/11/2017 0755   AST 16 04/03/2019 0930   AST 22 06/19/2018 0931   AST 27 02/11/2017 0755   ALT 10 04/03/2019 0930   ALT 13 06/19/2018 0931   ALT 24 02/11/2017 0755   BILITOT 0.4 04/03/2019 0930   BILITOT 0.2 (L) 06/19/2018 0931   BILITOT 0.43 02/11/2017 0755       No results found for: LABCA2  No components found for: LABCA125  No results for input(s): INR in the last 168 hours.  Urinalysis    Component Value Date/Time   COLORURINE STRAW (A) 11/22/2017 0940   APPEARANCEUR CLEAR 11/22/2017 0940   LABSPEC 1.002  (L) 11/22/2017 0940   LABSPEC 1.005 10/09/2015 1239   PHURINE 7.0 11/22/2017 0940   GLUCOSEU NEGATIVE 11/22/2017 0940   GLUCOSEU Negative 10/09/2015 1239   HGBUR NEGATIVE 11/22/2017 0940   BILIRUBINUR NEGATIVE 11/22/2017 0940   BILIRUBINUR Negative 10/09/2015 1239   KETONESUR NEGATIVE 11/22/2017 0940   PROTEINUR NEGATIVE 03/13/2019 1115   UROBILINOGEN 0.2 10/09/2015 1239   NITRITE NEGATIVE 11/22/2017 0940   LEUKOCYTESUR NEGATIVE 11/22/2017 0940   LEUKOCYTESUR Negative 10/09/2015 1239    STUDIES: No results found.   ELIGIBLE FOR AVAILABLE RESEARCH PROTOCOL:  no  ASSESSMENT: 54 y.o. BRCA negative [place] woman status post radical tumor debulking with optimal cytoreduction (R0) 03/27/2015 for a stage IIIC, high-grade right fallopian tube carcinoma  (a) baseline CA-125 was 1226.  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)  (c) tumor is PD-L1 negative (02/24/2016)  (d) tumor is strongly estrogen receptor positive, progesterone receptor negative (02/24/2016)  (1) adjuvant chemotherapy consisted of carboplatin and paclitaxel for 6 doses, begun 04/14/2015, completed 08/11/2015  (a) paclitaxel was omitted from cycle 5 and dose reduced on cycle 6 because of neuropathy   (b) a "make up" dose of paclitaxel was given 08/25/2015  (c) last carboplatin dose was 08/11/2015  (d) CA-125 normalized by 06/16/2015   (2) FIRST RECURRENCE: January 2018  (a) CA-125 rise beginning November 2017 led to CT scans and PET scans December 2017, all negative  (b) exploratory laparotomy 02/24/2016 showed pathologically confirmed recurrence, with miliary disease involving all examined surfaces  (c) port-site metastases noted 03/08/2016  (d) the January 2018 tumor sample was tested for the estrogen receptor and he was 80 percent positive  with strong staining, progesterone receptor negative  (3) carboplatin/liposomal doxorubicin started 03/05/2016, repeated every three weeks x4, completed 06/04/2016.  (a) cycle 3 delayed one week because of neutropenia; OnPro added  (b) CA 125 normalized after cycle 3  (4) RUCAPARIB maintenance started 07/02/2016  (a) restaging 10/01/2016: Normal CA 125, negative CT of the abdomen and pelvis  (b) labs 11/24/2016 shows a rise in her CA 125-43.4, with continuing rise thereafter  (c) rucaparib discontinued October 2018 with rising CA 125  (5) anastrozole started December 23, 2016-stopped after a couple of weeks with poor tolerance  SECOND RECURRENCE: (6) Gemcitabine/Bevacizumab started on 02/04/2017  (a) gemcitabine omitted cycle 4 because of intercurrent infection  (b) gemcitabine resumed with cycle 5, with intervening rise in  Ca 125  (c) repeat CT scan of the abdomen and pelvis on 05/26/2017 does not confirm obvious disease progression  (  d) cycle 5 of gemcitabine/bevacizumab delayed 1 week   (e) gemcitabine/ bevacizumab discontinued after 6 cycles, with rising CA 125 (last dose 06/24/2017)  (7) foundation one testing did not show a high mutation burden and the microsatellite status was stable.  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  These were not immediately actionable  (8) Abraxane started 07/12/2017, given weekly or weeks 1 and 2 of each 3 weeks cycle, last dose 11/15/2017 (5 cycles)  (a) cycle 2 started 08/09/2017, will be day 1 day 8 only  (b) cycle 3 scheduled to start 09/13/2017, day 1 day 8 at patient request  (c) CT of the abdomen and pelvis 11/28/2017 shows no measurable disease  (d) PET scan 01/25/2018 and MRI of the pelvis 02/03/2018 show no measurable disease  (e) patient has symptomatic ascites and a rapidly rising Ca1 25  (9) started cisplatin/gemcitabine days 1 and 8 of each 21-day cycle on 02/18/2018  (a) switched to carboplatin/ gemcitabine 07/17/2018 due to  neuropathy  (b) carbo/gemzar changed to Q14 days 07/24/2018 due to neutropenia  (c) considered niraparib/Zejula 100 mg, 2 tabs daily at the completion of chemotherapy  (10) associated problems  (a) anemia due to chemotherapy: On Aranesp  (b) 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 10/25/18 Consenting for AMV-564 clinical trial 21. 11/13/18 AMV-564 initiation 75 mcg dose 22. 10/8 Dropped down to 50 mcg dose (I think that date is correct) 23. 10/19-21/20 Tx held due to Neutropenia 24. 12/06/18 Given Neulasta 25. 12/07/18 AMV-564 resumed 26. 01/03/19 CT C/A/P 10% RECIST progression 27. 01/15/19 Increased dose to 75 mcg/daily 28. 01/17/19 Thoracentesis with cytology c/w [place] primary  29. Paracentesis 02/05/2019: cytology c/w adenocarcinoma  (12) schedule of thoracenteses:  (a) left 01/17/2019 [facility])  (a) left 02/06/2019 [facility])  (13) schedule of paracenteses:  (a) 01/30/2019 ([place])  (b) 02/06/2019 ([place])  (c) 02/18/2018 ([place])  (14) abraxane started 02/20/2019, repeated days 1 and 8 of every 21 day cycle  (a) bevacizumab/Avastin started 02/20/2019 and repeated every 21 days  (b) She cannot tolerate Zarxio due to side effects consisting of low grade fever, body aches, malaise, fatigue, and itching, whereas she has been able to tolerate Granix though  PLAN: Nashali is doing well today.  She continues on chemotherapy with Abraxane and Avastin and is tolerating this well. Her tumor markers have improved since her last treatment.    Ahtziri continues with acupuncture for her peripheral neuroapthy and is tolerating this well.    Lanise and I reviewed her treatment and when to do repeat scans again.  We discussed doing them either in 2 weeks versus 5 weeks depending on her abdominal distention, and tumor markers.  For now, I have ordered them to be  completed after her fourth cycle of treatment, however, if we need to move that up earlier we can do that as well.    Dawson will return in 1 week for day 8 of therapy, and in 3 weeks for cycle 4.  We are monitoring her tumor markers, which are not yet back and she does not want to know about them unless they are improved.  She was recommended to continue with the appropriate pandemic precautions. She knows to call for any questions that may arise between now and her next appointment.  We are happy to see her sooner if needed.  Total time this encounter: 30 minutes  Wilber Bihari, NP 04/03/19 10:22 AM Medical Oncology and Hematology [facility] [place], [place] [address] Tel. [phone]    Fax. [phone]  *Total Encounter Time as defined by the Centers for Medicare and Medicaid Services includes, in addition to the face-to-face time of a patient visit (documented in the note above) non-face-to-face time: obtaining and reviewing outside history, ordering and reviewing medications, tests or procedures, care coordination (communications with other health care professionals or caregivers) and documentation in the medical record.

## 2019-04-03 NOTE — Patient Instructions (Signed)
[facility] Discharge Instructions for Patients Receiving Chemotherapy  Today you received the following chemotherapy agents Abraxane, Zirabev.  To help prevent nausea and vomiting after your treatment, we encourage you to take your nausea medication.   If you develop nausea and vomiting that is not controlled by your nausea medication, call the clinic.   BELOW ARE SYMPTOMS THAT SHOULD BE REPORTED IMMEDIATELY:  *FEVER GREATER THAN 100.5 F  *CHILLS WITH OR WITHOUT FEVER  NAUSEA AND VOMITING THAT IS NOT CONTROLLED WITH YOUR NAUSEA MEDICATION  *UNUSUAL SHORTNESS OF BREATH  *UNUSUAL BRUISING OR BLEEDING  TENDERNESS IN MOUTH AND THROAT WITH OR WITHOUT PRESENCE OF ULCERS  *URINARY PROBLEMS  *BOWEL PROBLEMS  UNUSUAL RASH Items with * indicate a potential emergency and should be followed up as soon as possible.  Feel free to call the clinic should you have any questions or concerns. The clinic phone number is [phone].  Please show the [facility] at check-in to the Emergency Department and triage nurse.

## 2019-04-04 ENCOUNTER — Encounter: Payer: Self-pay | Admitting: Adult Health

## 2019-04-06 LAB — CA 125: Cancer Antigen (CA) 125: 5218 U/mL — ABNORMAL HIGH (ref 0.0–38.1)

## 2019-04-10 ENCOUNTER — Inpatient Hospital Stay: Payer: 59

## 2019-04-10 ENCOUNTER — Other Ambulatory Visit: Payer: Self-pay

## 2019-04-10 VITALS — BP 127/80 | HR 63 | Temp 98.5°F | Resp 18

## 2019-04-10 DIAGNOSIS — C786 Secondary malignant neoplasm of retroperitoneum and peritoneum: Secondary | ICD-10-CM

## 2019-04-10 DIAGNOSIS — C762 Malignant neoplasm of abdomen: Secondary | ICD-10-CM

## 2019-04-10 DIAGNOSIS — D701 Agranulocytosis secondary to cancer chemotherapy: Secondary | ICD-10-CM

## 2019-04-10 DIAGNOSIS — T451X5A Adverse effect of antineoplastic and immunosuppressive drugs, initial encounter: Secondary | ICD-10-CM

## 2019-04-10 DIAGNOSIS — Z5112 Encounter for antineoplastic immunotherapy: Secondary | ICD-10-CM | POA: Diagnosis not present

## 2019-04-10 DIAGNOSIS — D702 Other drug-induced agranulocytosis: Secondary | ICD-10-CM

## 2019-04-10 DIAGNOSIS — C5701 Malignant neoplasm of right fallopian tube: Secondary | ICD-10-CM

## 2019-04-10 DIAGNOSIS — Z95828 Presence of other vascular implants and grafts: Secondary | ICD-10-CM

## 2019-04-10 DIAGNOSIS — Z7189 Other specified counseling: Secondary | ICD-10-CM

## 2019-04-10 LAB — COMPREHENSIVE METABOLIC PANEL
ALT: 11 U/L (ref 0–44)
AST: 17 U/L (ref 15–41)
Albumin: 3.8 g/dL (ref 3.5–5.0)
Alkaline Phosphatase: 61 U/L (ref 38–126)
Anion gap: 10 (ref 5–15)
BUN: 21 mg/dL — ABNORMAL HIGH (ref 6–20)
CO2: 24 mmol/L (ref 22–32)
Calcium: 9.1 mg/dL (ref 8.9–10.3)
Chloride: 103 mmol/L (ref 98–111)
Creatinine, Ser: 0.84 mg/dL (ref 0.44–1.00)
GFR calc Af Amer: >60 mL/min (ref >60)
GFR calc non Af Amer: >60 mL/min (ref >60)
Glucose, Bld: 92 mg/dL (ref 70–99)
Potassium: 4.4 mmol/L (ref 3.5–5.1)
Sodium: 137 mmol/L (ref 135–145)
Total Bilirubin: 0.2 mg/dL — ABNORMAL LOW (ref 0.3–1.2)
Total Protein: 7.6 g/dL (ref 6.5–8.1)

## 2019-04-10 LAB — CBC WITH DIFFERENTIAL/PLATELET
Abs Immature Granulocytes: 0.01 10*3/uL (ref 0.00–0.07)
Basophils Absolute: 0 10*3/uL (ref 0.0–0.1)
Basophils Relative: 1 %
Eosinophils Absolute: 0 10*3/uL (ref 0.0–0.5)
Eosinophils Relative: 1 %
HCT: 32.9 % — ABNORMAL LOW (ref 36.0–46.0)
Hemoglobin: 10.1 g/dL — ABNORMAL LOW (ref 12.0–15.0)
Immature Granulocytes: 0 %
Lymphocytes Relative: 43 %
Lymphs Abs: 1.4 10*3/uL (ref 0.7–4.0)
MCH: 28.1 pg (ref 26.0–34.0)
MCHC: 30.7 g/dL (ref 30.0–36.0)
MCV: 91.6 fL (ref 80.0–100.0)
Monocytes Absolute: 0.2 10*3/uL (ref 0.1–1.0)
Monocytes Relative: 8 %
Neutro Abs: 1.5 10*3/uL — ABNORMAL LOW (ref 1.7–7.7)
Neutrophils Relative %: 47 %
Platelets: 273 10*3/uL (ref 150–400)
RBC: 3.59 MIL/uL — ABNORMAL LOW (ref 3.87–5.11)
RDW: 18.1 % — ABNORMAL HIGH (ref 11.5–15.5)
WBC: 3.2 10*3/uL — ABNORMAL LOW (ref 4.0–10.5)
nRBC: 0 % (ref 0.0–0.2)

## 2019-04-10 LAB — FERRITIN: Ferritin: 184 ng/mL (ref 11–307)

## 2019-04-10 MED ORDER — PACLITAXEL PROTEIN-BOUND CHEMO INJECTION 100 MG
100.0000 mg/m2 | Freq: Once | INTRAVENOUS | Status: AC
  Administered 2019-04-10: 11:00:00 150 mg via INTRAVENOUS
  Filled 2019-04-10: qty 30

## 2019-04-10 MED ORDER — HEPARIN SOD (PORK) LOCK FLUSH 100 UNIT/ML IV SOLN
500.0000 [IU] | Freq: Once | INTRAVENOUS | Status: AC | PRN
  Administered 2019-04-10: 500 [IU]
  Filled 2019-04-10: qty 5

## 2019-04-10 MED ORDER — ONDANSETRON HCL 8 MG PO TABS
ORAL_TABLET | ORAL | Status: AC
  Filled 2019-04-10: qty 1

## 2019-04-10 MED ORDER — PEGFILGRASTIM 6 MG/0.6ML ~~LOC~~ PSKT
PREFILLED_SYRINGE | SUBCUTANEOUS | Status: AC
  Filled 2019-04-10: qty 0.6

## 2019-04-10 MED ORDER — SODIUM CHLORIDE 0.9% FLUSH
10.0000 mL | INTRAVENOUS | Status: DC | PRN
  Administered 2019-04-10: 10 mL
  Filled 2019-04-10: qty 10

## 2019-04-10 MED ORDER — PEGFILGRASTIM 6 MG/0.6ML ~~LOC~~ PSKT
6.0000 mg | PREFILLED_SYRINGE | Freq: Once | SUBCUTANEOUS | Status: AC
  Administered 2019-04-10: 6 mg via SUBCUTANEOUS

## 2019-04-10 MED ORDER — SODIUM CHLORIDE 0.9% FLUSH
10.0000 mL | INTRAVENOUS | Status: DC | PRN
  Administered 2019-04-10: 09:00:00 10 mL
  Filled 2019-04-10: qty 10

## 2019-04-10 MED ORDER — SODIUM CHLORIDE 0.9 % IV SOLN
Freq: Once | INTRAVENOUS | Status: AC
  Filled 2019-04-10: qty 250

## 2019-04-10 MED ORDER — ONDANSETRON HCL 8 MG PO TABS
8.0000 mg | ORAL_TABLET | Freq: Once | ORAL | Status: AC
  Administered 2019-04-10: 8 mg via ORAL

## 2019-04-10 NOTE — Patient Instructions (Signed)

## 2019-04-10 NOTE — Patient Instructions (Signed)
[facility] Discharge Instructions for Patients Receiving Chemotherapy  Today you received the following chemotherapy agents:  Abraxane.  To help prevent nausea and vomiting after your treatment, we encourage you to take your nausea medication as directed.   If you develop nausea and vomiting that is not controlled by your nausea medication, call the clinic.   BELOW ARE SYMPTOMS THAT SHOULD BE REPORTED IMMEDIATELY:  *FEVER GREATER THAN 100.5 F  *CHILLS WITH OR WITHOUT FEVER  NAUSEA AND VOMITING THAT IS NOT CONTROLLED WITH YOUR NAUSEA MEDICATION  *UNUSUAL SHORTNESS OF BREATH  *UNUSUAL BRUISING OR BLEEDING  TENDERNESS IN MOUTH AND THROAT WITH OR WITHOUT PRESENCE OF ULCERS  *URINARY PROBLEMS  *BOWEL PROBLEMS  UNUSUAL RASH Items with * indicate a potential emergency and should be followed up as soon as possible.  Feel free to call the clinic should you have any questions or concerns. The clinic phone number is [phone].  Please show the CHEMO ALERT CARD at check-in to the Emergency Department and triage nurse.   

## 2019-04-13 ENCOUNTER — Other Ambulatory Visit: Payer: Self-pay | Admitting: Oncology

## 2019-04-13 ENCOUNTER — Telehealth: Payer: Self-pay

## 2019-04-13 ENCOUNTER — Telehealth: Payer: Self-pay | Admitting: *Deleted

## 2019-04-13 ENCOUNTER — Encounter: Payer: Self-pay | Admitting: Adult Health

## 2019-04-13 ENCOUNTER — Other Ambulatory Visit: Payer: Self-pay | Admitting: Adult Health

## 2019-04-13 NOTE — Progress Notes (Signed)
Scans moved to 3/5.  She will see Dr. Jana Hakim on 3/9.

## 2019-04-13 NOTE — Telephone Encounter (Signed)
Called and given new date for scan. Arrive at 7:15 for 0730 appt on 3/8, NPO 4 hours prior. Drink first bottle at 0530 and 2nd bottle at 0630. She verbalized understanding.

## 2019-04-13 NOTE — Telephone Encounter (Signed)
This RN post communication with MD and midlevel contacted Dr Gershon Crane office - and spoke with Triage nurse- Kathryn Ramsey regarding pt's inquiry regarding paracentesis.  Informed her of dates of last Avastin as well as most recent labs - which she was able to locate on Care Everywhere per this call.  Informed her pt is being advised of potential bleeding with paracentesis - and goal of care is control and comfort.  Of note pt has noted a 5 lb weight gain as well as feeling full with discomfort.  Kathryn Ramsey states she will informed Kathryn Ramsey- Dr Gershon Crane nurse of above for further scheduling of paracentesis.

## 2019-04-19 ENCOUNTER — Encounter: Payer: Self-pay | Admitting: Oncology

## 2019-04-23 ENCOUNTER — Other Ambulatory Visit: Payer: Self-pay

## 2019-04-23 ENCOUNTER — Ambulatory Visit (HOSPITAL_COMMUNITY)
Admission: RE | Admit: 2019-04-23 | Discharge: 2019-04-23 | Disposition: A | Discharge status: Home / Self Care | Payer: 59 | Source: Ambulatory Visit | Attending: Adult Health | Admitting: Adult Health

## 2019-04-23 ENCOUNTER — Encounter (HOSPITAL_COMMUNITY): Payer: Self-pay

## 2019-04-23 DIAGNOSIS — C762 Malignant neoplasm of abdomen: Secondary | ICD-10-CM | POA: Diagnosis present

## 2019-04-23 DIAGNOSIS — C5701 Malignant neoplasm of right fallopian tube: Secondary | ICD-10-CM | POA: Diagnosis present

## 2019-04-23 MED ORDER — IOHEXOL 300 MG/ML  SOLN: 100.0000 mL | Freq: Once | INTRAMUSCULAR | Status: DC | PRN

## 2019-04-23 MED ORDER — IOHEXOL 300 MG/ML  SOLN
75.0000 mL | Freq: Once | INTRAMUSCULAR | Status: AC | PRN
  Administered 2019-04-23: 75 mL via INTRAVENOUS

## 2019-04-23 MED ORDER — SODIUM CHLORIDE (PF) 0.9 % IJ SOLN
INTRAMUSCULAR | Status: AC
  Filled 2019-04-23: qty 50

## 2019-04-23 NOTE — Progress Notes (Signed)
[place]  [phone] [phone]    ID: Kathryn Ramsey DOB: 07/21/1965  MR#: [id number]  FGH#:[id number]  Patient Care Team: Harlan Stains, MD as PCP - General (Family Medicine) [name], MD as Consulting Physician (Obstetrics and Gynecology) Juanita Craver, MD as Consulting Physician (Gastroenterology) Servando Salina, MD as Consulting Physician (Obstetrics and Gynecology) Nickie Retort, MD as Consulting Physician (Urology) Gillis Ends, MD as Referring Physician (Obstetrics and Gynecology) Mettu, Baltazar Apo, MD as Referring Physician (Oncology) OTHER MD: Festus Aloe [phone]) Floyde Parkins  NOTE: Patient does not want to be informed of her CA-125.  CHIEF COMPLAINT:  ovarian cancer  CURRENT TREATMENT: Abraxane, bevacizumab   INTERVAL HISTORY: Kathryn Ramsey returns today for follow-up and treatment of her recurrent ovarian cancer accompanied by her husband [name].  She has been receiving treatment with Abraxane and Bevacizumab given on day 1 of a 21 day cycle and Abraxane alone given on day 8 of a 21 day cycle.  Today is cycle 4 day 1 of treatment.   However since her last visit, she underwent abdomen/pelvis CT yesterday, 04/23/2019 showing evidence of disease progression (detailed below).  Note she had to receive Zarxio (Neupogen biosimilar) due to chemotherapy induced neutropenia on 2/3, 2/4, and 2/5.  She had body aches, fever, chills, headaches afterward, and mild itching.  She has really has a difficult time tolerating thsi injection as compared to Granix and is asking to receive granix instead in the future, if insurance would graciously allow.   On 04/17/2019 she underwent ultrasound-guided paracentesis at [facility] with removal of 650 cc of milky yellow fluid.  That completely obliterated the pocket.  She felt better after the procedure, with less nausea in particular and did not have pain right after the procedure as  she has had in the past  She is here today to discuss next steps.  REVIEW OF SYSTEMS: Kathryn Ramsey is not having normal bowel movements and normal bladder function.  There has been no jaundice or evidence of biliary tract obstruction.  She does yoga at least 4 times a week and is planning to go back to her walking program.  She feels she can easily do 3 miles on a cool day day on a flat surface.  She has discomfort in her abdomen and sometimes when she stands for long time or walks there is a little bit of pocket in the left inguinal area which disappears when she sits or lies down.  If the discomfort is significant she will take a Tylenol and Aleve but she has not done this more than once a day so far and does not do that every day.  She is sleeping well, taking a little bit of magnesium at bedtime.   HISTORY OF PRESENT ILLNESS: From Dr. Serita Grit original intake note 03/17/2015:  "Kathryn Ramsey is a very pleasant G4P4 who is seen in consultation at the request of Dr Collene Mares and Dr Garwin Brothers for peritoneal carcinomatosis. The patient has a history of a workup by urology for gross hematuria which included a pelvic examine was suggested for extrinsic mass effect on the bladder on cystoscopy. Cystoscopy took place on in December 2016. The patient denies abdominal pain bloating early satiety or abdominal distention.  On 08/03/2015 at [facility] she underwent a CT scan of the abdomen and pelvis as ordered by Dr Baruch Gouty. This revealed a 1.4 cm low attenuation lesion on the posterior right hepatic lobe suggestive of a hemangioma. Status post hysterectomy. No ovarian  masses. Moderate ascites. Omental caking seen in the lateral left abdomen and pelvis. Peritoneal nodularity in the left lateral pelvic cul-de-sac. No gross extrinsic compression on the bladder was identified on imaging.  The patient was then seen by her gastroenterologist, Dr. Collene Mares, who performed a colonoscopy which was unremarkable.  Tumor  markers were drawn on 08/04/2015 and these included a CA-125 that was elevated to 957, and a CEA that was normal at 1."  On 03/27/2015 the patient underwent diagnostic laparoscopy, exploratory laparotomy with bilateral salpingo-oophorectomy, omentectomy, and radical tumor debulking with optimal cytoreduction (R0) of what proved to be a stage IIIc right fallopian tube cancer. She was treated adjuvantly with carboplatin and paclitaxel, as detailed below. Her CA 125 dropped from 1226 on 03/20/2015 to 26.1 by 06/16/2015.  Her subsequent history is as detailed above.   PAST MEDICAL HISTORY: Past Medical History:  Diagnosis Date   Acute sensory neuropathy 06/26/2015   Allergy    Anemia    Asthma    illness induced asthma   Blood transfusion without reported diagnosis    at age 35 years old D/T surgery to femur being crushed   Complication of anesthesia    hx. of allergic to ether (had surgery at 5 years and had reaction to the ether   Heart murmur    pt, states had a "working heart murmur"   History of kidney stones    Neutropenia, drug-induced ([place]) 03/10/2018   PONV (postoperative nausea and vomiting)    Skin cancer     PAST SURGICAL HISTORY: Past Surgical History:  Procedure Laterality Date   3 laporscopic proceedures     ABDOMINAL HYSTERECTOMY     2010   CHOLECYSTECTOMY     2010   DILATION AND CURETTAGE OF UTERUS     1997   IR IMAGING GUIDED PORT INSERTION  03/08/2018   LAPAROSCOPY N/A 03/27/2015   Procedure: DIAGNOSTIC LAPAROSCOPY ;  Surgeon: [name], MD;  Location: [facility];  Service: Gynecology;  Laterality: N/A;   LAPAROSCOPY N/A 02/24/2016   Procedure: LAPAROSCOPY DIAGNOSTIC WITH PERITONEAL WASHINGS AND PERITONEAL BIOPSY;  Surgeon: [name], MD;  Location: [facility];  Service: Gynecology;  Laterality: N/A;   LAPAROTOMY N/A 03/27/2015   Procedure: EXPLORATORY LAPAROTOMY, BILATERAL SALPINGO OOPHORECTOMY, OMENTECTOMY, RADICAL TUMOR DEBULKING;  Surgeon: [name], MD;  Location: [facility];  Service: Gynecology;  Laterality: N/A;   sinus surgery 1994      FAMILY HISTORY Family History  Problem Relation Age of Onset   Hypertension Father    Skin cancer Father        nonmelanoma skin cancers in his late 43s   Non-Hodgkin's lymphoma Maternal Aunt        dx. 16s; smoker   Stroke Maternal Grandmother    Other Mother        benign meningioma dx. early-mid-70s; hysterectomy in her late 5s for heavy periods - still has ovaries   Other Son        one son with pre-cancerous skin findings   Other Daughter        cysts on ovaries and hx of heavy periods   Other Sister        hysterectomy for cysts - still has ovaries   Heart attack Maternal Grandfather    Heart attack Paternal Grandfather    Renal cancer Maternal Aunt 60       smoker   Breast cancer Maternal Aunt 78   Other Maternal Aunt  dx. benign brain tumor (meningioma) at 17; 2nd benign brain tumor in her late 43s; hx of radical hysterectomy at age 32   Brain cancer Other        NOS tumor  The patient's parents are both living, in their late 75s as of January 2018. The patient has one brother, 3 sisters. There is no history of ovarian cancer in the family. One maternal aunt had breast and kidney cancer, another had non-Hodgkin's lymphoma.   GYNECOLOGIC HISTORY:  No LMP recorded. Patient has had a hysterectomy. 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 Her husband [name] is a Engineer, maintenance (IT). Their children are 20, 4, 79 and 27 y/o as of JAN 2018. The oldest works as an Electrical engineer in the [employer] in [place]. The other 3 children live in [place], the youngest currently attending [employer]. The patient has no grandchildren. She attends [employer]   ADVANCED DIRECTIVES: Enrika has completed advanced directives and they are filed.  Her husband [name] is her  healthcare power of attorney.  HEALTH MAINTENANCE: Social History   Tobacco Use   Smoking status: Never Smoker   Smokeless tobacco: Never Used  Substance Use Topics   Alcohol use: Yes    Comment:  3 glasses wine or beer/week   Drug use: No    Colonoscopy:2016  PAP: s/p hyst  Bone density:   Allergies  Allergen Reactions   Bee Venom Shortness Of Breath    swelling   Prochlorperazine Edisylate Anaphylaxis, Anxiety and Palpitations    Anaphylaxis, Anxiety and Palpitations    Sulfa Antibiotics Shortness Of Breath    Vomit , diarrhea , hives   Zarxio [Filgrastim]     Current Outpatient Medications  Medication Sig Dispense Refill   acetaminophen (TYLENOL) 500 MG tablet Take 2 tablets by mouth 3 (three) times daily as needed.     albuterol (VENTOLIN HFA) 108 (90 Base) MCG/ACT inhaler Inhale into the lungs.     diphenhydrAMINE (BENADRYL) 50 MG capsule 50 mg Diphenhydramine (Benadryl) PO within 1 hour of the injection 1 capsule 0   exemestane (AROMASIN) 25 MG tablet Take 1 tablet (25 mg total) by mouth daily after breakfast. 90 tablet 4   fluticasone (FLONASE) 50 MCG/ACT nasal spray Place 1 spray into both nostrils 2 (two) times daily. (Patient taking differently: Place 1 spray into both nostrils as needed. ) 16 g 2   gabapentin (NEURONTIN) 100 MG capsule 200 mg bid with 300 mg qhs 630 capsule 1   ibuprofen (ADVIL) 600 MG tablet Take 1 tablet by mouth 4 (four) times daily as needed.     lidocaine-prilocaine (EMLA) cream APPLY TOPICALLY AS NEEDED. 30 g 0   loratadine (CLARITIN) 10 MG tablet Take 10 mg by mouth as needed.      magnesium oxide (MAG-OX) 400 (241.3 Mg) MG tablet Take 1 tablet (400 mg total) by mouth daily. 60 tablet 3   Multiple Vitamins-Minerals (MULTIVITAMIN ADULT PO) Take 1 tablet by mouth daily.     Omega 3 1000 MG CAPS Take 1 capsule by mouth daily.     omeprazole (PRILOSEC) 40 MG capsule Take 1 capsule (40 mg total) by mouth daily. 30 capsule 5     ondansetron (ZOFRAN) 8 MG tablet      senna-docusate (SENOKOT-S) 8.6-50 MG tablet Take 1-2 tablets by mouth 2 (two) times daily as needed.  sertraline (ZOLOFT) 50 MG tablet Take 1.5 tablets (75 mg total) by mouth daily. Increase dose to 75 mg daily 45 tablet 3   traMADol (ULTRAM) 50 MG tablet Take 1 tablet (50 mg total) by mouth every 6 (six) hours as needed. 60 tablet 0   valACYclovir (VALTREX) 1000 MG tablet Take 1 tablet (1,000 mg total) by mouth daily. 5 tablet 0   VENTOLIN HFA 108 (90 Base) MCG/ACT inhaler      vitamin B-12 (CYANOCOBALAMIN) 1000 MCG tablet Take 1 tablet by mouth daily.     No current facility-administered medications for this visit.   Facility-Administered Medications Ordered in Other Visits  Medication Dose Route Frequency Provider Last Rate Last Admin   heparin lock flush 100 unit/mL  500 Units Intravenous Once Jahnya Trindade, Virgie Dad, MD       sodium chloride flush (NS) 0.9 % injection 10 mL  10 mL Intravenous PRN Jaisean Monteforte, Virgie Dad, MD        OBJECTIVE: Middle-aged white woman in no acute distress  Vitals:   04/24/19 0840  BP: 115/68  Pulse: 71  Resp: 18  Temp: 98.3 F (36.8 C)  SpO2: 99%     Body mass index is 20.71 kg/m.   Filed Weights   04/24/19 0840  Weight: 116 lb 14.4 oz (53 kg)    ECOG FS:1 - Symptomatic but completely ambulatory  Sclerae unicteric, EOMs intact Wearing a mask No cervical or supraclavicular adenopathy Lungs no rales or rhonchi Heart regular rate and rhythm Abd soft, minimally distended, nontender, positive bowel sounds; the area of port site recurrences are flat and show no erythema or tenderness. MSK no focal spinal tenderness Neuro: nonfocal, well oriented, positive affect Breasts: Deferred   LAB RESULTS:  CMP     Component Value Date/Time   NA 137 04/24/2019 0835   NA 140 02/11/2017 0755   K 4.5 04/24/2019 0835   K 4.4 02/11/2017 0755   CL 104 04/24/2019 0835   CO2 26 04/24/2019 0835   CO2 26  02/11/2017 0755   GLUCOSE 91 04/24/2019 0835   GLUCOSE 93 02/11/2017 0755   BUN 15 04/24/2019 0835   BUN 7.9 02/11/2017 0755   CREATININE 0.88 04/24/2019 0835   CREATININE 1.06 (H) 06/19/2018 0931   CREATININE 0.7 02/11/2017 0755   CALCIUM 9.2 04/24/2019 0835   CALCIUM 9.6 02/11/2017 0755   PROT 7.2 04/24/2019 0835   PROT 7.5 02/11/2017 0755   ALBUMIN 3.5 04/24/2019 0835   ALBUMIN 4.4 02/11/2017 0755   AST 17 04/24/2019 0835   AST 22 06/19/2018 0931   AST 27 02/11/2017 0755   ALT 11 04/24/2019 0835   ALT 13 06/19/2018 0931   ALT 24 02/11/2017 0755   ALKPHOS 108 04/24/2019 0835   ALKPHOS 58 02/11/2017 0755   BILITOT 0.2 (L) 04/24/2019 0835   BILITOT 0.2 (L) 06/19/2018 0931   BILITOT 0.43 02/11/2017 0755   GFRNONAA >60 04/24/2019 0835   GFRNONAA 59 (L) 06/19/2018 0931   GFRAA >60 04/24/2019 0835   GFRAA >60 06/19/2018 0931    INo results found for: SPEP, UPEP  Lab Results  Component Value Date   WBC 9.5 04/24/2019   NEUTROABS 7.4 04/24/2019   HGB 10.5 (L) 04/24/2019   HCT 33.6 (L) 04/24/2019   MCV 90.8 04/24/2019   PLT 327 04/24/2019      Chemistry      Component Value Date/Time   NA 137 04/24/2019 0835   NA 140 02/11/2017 0755   K 4.5  04/24/2019 0835   K 4.4 02/11/2017 0755   CL 104 04/24/2019 0835   CO2 26 04/24/2019 0835   CO2 26 02/11/2017 0755   BUN 15 04/24/2019 0835   BUN 7.9 02/11/2017 0755   CREATININE 0.88 04/24/2019 0835   CREATININE 1.06 (H) 06/19/2018 0931   CREATININE 0.7 02/11/2017 0755      Component Value Date/Time   CALCIUM 9.2 04/24/2019 0835   CALCIUM 9.6 02/11/2017 0755   ALKPHOS 108 04/24/2019 0835   ALKPHOS 58 02/11/2017 0755   AST 17 04/24/2019 0835   AST 22 06/19/2018 0931   AST 27 02/11/2017 0755   ALT 11 04/24/2019 0835   ALT 13 06/19/2018 0931   ALT 24 02/11/2017 0755   BILITOT 0.2 (L) 04/24/2019 0835   BILITOT 0.2 (L) 06/19/2018 0931   BILITOT 0.43 02/11/2017 0755       No results found for: LABCA2  No  components found for: LABCA125  No results for input(s): INR in the last 168 hours.  Urinalysis    Component Value Date/Time   COLORURINE STRAW (A) 11/22/2017 0940   APPEARANCEUR CLEAR 11/22/2017 0940   LABSPEC 1.002 (L) 11/22/2017 0940   LABSPEC 1.005 10/09/2015 1239   PHURINE 7.0 11/22/2017 0940   GLUCOSEU NEGATIVE 11/22/2017 0940   GLUCOSEU Negative 10/09/2015 1239   HGBUR NEGATIVE 11/22/2017 0940   BILIRUBINUR NEGATIVE 11/22/2017 0940   BILIRUBINUR Negative 10/09/2015 1239   KETONESUR NEGATIVE 11/22/2017 0940   PROTEINUR NEGATIVE 04/03/2019 1050   UROBILINOGEN 0.2 10/09/2015 1239   NITRITE NEGATIVE 11/22/2017 0940   LEUKOCYTESUR NEGATIVE 11/22/2017 0940   LEUKOCYTESUR Negative 10/09/2015 1239    STUDIES: CT Abdomen Pelvis W Contrast  Result Date: 04/23/2019 CLINICAL DATA:  Ovarian cancer staging. EXAM: CT ABDOMEN AND PELVIS WITH CONTRAST TECHNIQUE: Multidetector CT imaging of the abdomen and pelvis was performed using the standard protocol following bolus administration of intravenous contrast. CONTRAST:  79m OMNIPAQUE IOHEXOL 300 MG/ML  SOLN COMPARISON:  09/28/2018 FINDINGS: Lower chest: Signs of basilar atelectasis in the setting of a moderately large left-sided pleural effusion question mild pleural thickening (image 12, series 2) approximately 3 mm greatest thickness. Associated volume loss in the left lower lobe as above. Hepatobiliary: Hemangioma in the posterior right hepatic lobe as before. Scalloped hepatic margins due to moderate volume of peritoneal fluid that has developed since the previous exam. No signs of intraparenchymal hepatic lesion. The portal vein remains patent. Post cholecystectomy. Pancreas: Pancreas is unremarkable without signs of focal lesion, ductal dilation or inflammation. Spleen: The spleen is normal aside from fluid which surrounds the splenic margin anteriorly. Adrenals/Urinary Tract: Adrenal glands are normal. Kidneys with smooth bilateral renal  contours and no suspicious, focal hip renal lesion. Stomach/Bowel: Bowel loops in the abdomen are surrounded by peritoneal fluid. Thickening of mesenteric reflections and tethered appearance of bowel loops particularly the distal sigmoid colon and small bowel loops in the pelvis (image 63, series 2) these loops are held in close apposition with thickening of fascial in mesenteric planes, as much as 4 mm greatest thickness in this area. Clustered bowel loops with respect to mid and distal small bowel in the lower and central abdomen/upper pelvis. Wall there is some mild distension there is no overt evidence of obstruction with contrast passing into the distal small bowel and into the proximal and mid colon. Appendix not visualized. Vascular/Lymphatic: No signs of significant atherosclerotic change in the abdominal aorta. No signs of aneurysmal dilation. No adenopathy in the retroperitoneum or in the  upper abdomen. No signs of adenopathy in the pelvis. Reproductive: Post hysterectomy. Other: Peritoneal thickening throughout. Signs of nodularity as well. (Image 27, series 2) 4 mm nodule in the left upper quadrant adjacent to spleen and colon. (Image 70, series 2) 13 x 17 mm nodule in the cul-de-sac previously approximately 9 x 14 mm with some fullness of the right vaginal apex as well. Musculoskeletal: No signs of acute bone finding or evidence of destructive bone process. IMPRESSION: 1. Interval development of moderate volume peritoneal fluid. 2. Signs of peritoneal carcinomatosis. The largest nodule in the cul-de-sac measures 13 x 17 mm, previously 9 x 14 mm. 3. Mesenteric and peritoneal thickening with angulated bowel loops particularly mid to distal small bowel and rectosigmoid colon. Mild dilation of small bowel loops may indicate mild partial small bowel obstruction. There are no overt signs of obstruction currently. 4. Signs of basilar atelectasis in the setting of a moderately large left-sided pleural effusion.  Moderate pleural effusion may be indicative of malignant pleural fluid based on appearance and other findings on the exam. These results will be called to the ordering clinician or representative by the Radiologist Assistant, and communication documented in the PACS or zVision Dashboard. Electronically Signed   By: Zetta Bills M.D.   On: 04/23/2019 08:42     ELIGIBLE FOR AVAILABLE RESEARCH PROTOCOL:  no  ASSESSMENT: 54 y.o. BRCA negative [place] woman status post radical tumor debulking with optimal cytoreduction (R0) 03/27/2015 for a stage IIIC, high-grade right fallopian tube carcinoma  (a) baseline CA-125 was 1226.  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)  (c) tumor is PD-L1 negative (02/24/2016)  (d) tumor is strongly estrogen receptor positive, progesterone receptor negative (02/24/2016)  (1) adjuvant chemotherapy consisted of carboplatin and paclitaxel for 6 doses, begun 04/14/2015, completed 08/11/2015  (a) paclitaxel was omitted from cycle 5 and dose reduced on cycle 6 because of neuropathy   (b) a "make up" dose of paclitaxel was given 08/25/2015  (c) last carboplatin dose was 08/11/2015  (d) CA-125 normalized by 06/16/2015   (2) FIRST RECURRENCE: January 2018  (a) CA-125 rise beginning November 2017 led to CT scans and PET scans December 2017, all negative  (b) exploratory laparotomy 02/24/2016 showed pathologically confirmed recurrence, with miliary disease involving all examined surfaces  (c) port-site metastases noted 03/08/2016  (d) the January 2018 tumor sample was tested for the estrogen receptor and he was 80 percent positive with strong staining, progesterone receptor negative  (3) carboplatin/liposomal doxorubicin started 03/05/2016, repeated every three weeks x4, completed 06/04/2016.  (a)  cycle 3 delayed one week because of neutropenia; OnPro added  (b) CA 125 normalized after cycle 3  (4) RUCAPARIB maintenance started 07/02/2016  (a) restaging 10/01/2016: Normal CA 125, negative CT of the abdomen and pelvis  (b) labs 11/24/2016 shows a rise in her CA 125-43.4, with continuing rise thereafter  (c) rucaparib discontinued October 2018 with rising CA 125  (5) anastrozole started December 23, 2016-stopped after a couple of weeks with poor tolerance  SECOND RECURRENCE: (6) Gemcitabine/Bevacizumab started on 02/04/2017  (a) gemcitabine omitted cycle 4 because of intercurrent infection  (b) gemcitabine resumed with cycle 5, with intervening rise in  Ca 125  (c) repeat CT scan of the abdomen and pelvis on 05/26/2017 does not confirm obvious disease progression  (d) cycle 5 of gemcitabine/bevacizumab delayed 1 week   (  e) gemcitabine/ bevacizumab discontinued after 6 cycles, with rising CA 125 (last dose 06/24/2017)  (7) foundation one testing did not show a high mutation burden and the microsatellite status was stable.  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  These were not immediately actionable  (8) Abraxane started 07/12/2017, given weekly or weeks 1 and 2 of each 3 weeks cycle, last dose 11/15/2017 (5 cycles)  (a) cycle 2 started 08/09/2017, will be day 1 day 8 only  (b) cycle 3 scheduled to start 09/13/2017, day 1 day 8 at patient request  (c) CT of the abdomen and pelvis 11/28/2017 shows no measurable disease  (d) PET scan 01/25/2018 and MRI of the pelvis 02/03/2018 show no measurable disease  (e) patient has symptomatic ascites and a rapidly rising Ca1 25  (9) started cisplatin/gemcitabine days 1 and 8 of each 21-day cycle on 02/18/2018  (a) switched to carboplatin/ gemcitabine 07/17/2018 due to neuropathy  (b) carbo/gemzar changed to Q14 days 07/24/2018 due to neutropenia  (c) considered niraparib/Zejula 100 mg, 2 tabs daily at the completion of  chemotherapy  (10) associated problems  (a) anemia due to chemotherapy: On Aranesp  (b) 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 10/25/18 Consenting for AMV-564 clinical trial 21. 11/13/18 AMV-564 initiation 75 mcg dose 22. 10/8 Dropped down to 50 mcg dose (I think that date is correct) 23. 10/19-21/20 Tx held due to Neutropenia 24. 12/06/18 Given Neulasta 25. 12/07/18 AMV-564 resumed 26. 01/03/19 CT C/A/P 10% RECIST progression 27. 01/15/19 Increased dose to 75 mcg/daily 28. 01/17/19 Thoracentesis with cytology c/w Mullerian primary  29. Paracentesis 02/05/2019: cytology c/w adenocarcinoma  (12) schedule of thoracenteses:  (a) left 01/17/2019 [facility])  (b) left 02/06/2019 [facility])  (c)   (13) schedule of paracenteses:  (a) 01/30/2019 ([place])  (b) 02/06/2019 ([place])  (c) 02/18/2018 ([place])  (d) 04/17/2019 ([place])  (14) abraxane started 02/20/2019, repeated days 1 and 8 of every 21 day cycle  (a) bevacizumab/Avastin started 02/20/2019 and repeated every 21 days  (b) She cannot tolerate Zarxio due to side effects consisting of low grade fever, body aches, malaise, fatigue, and itching, whereas she has been able to tolerate Granix though  (c) bevacizumab and Abraxane discontinued after 04/10/2019 dose with evidence of progression  (15) exemestane started 04/24/2019  (16) considering: Phase 1 studies; P 32 installation; return to carbo/Doxil; supplements  PLAN: Nadea's cancer did not respond to Abraxane and bevacizumab and we are discontinuing that.  We know her tumor is strongly estrogen receptor positive.  Even though that does not guarantee a response, while we are waiting to see which way to go I think it is very reasonable for her to give exemestane a try.  We discussed the possible toxicities side effects and complications of this agent I have  put in the prescription for her to start it.  Fabiha is willing to travel if that is what it takes for her to participate in a promising phase 1 study.  I have discussed that with Dr. Theora Gianotti at [facility] and she will follow-up on that and let us know what is available.  Martha herself would be interested in going back to carbo/Doxil which she received a couple years ago with good response.  I am not sure if we are still doing P 32 installations but that is another idea we could consider.  Optimally though she would participate  in a trial and that is what we hope to have clarified within the next 2 to 3 weeks  She will see me again March 26 and we will catch up at that time  Total encounter time 35 minutes.Sarajane Jews C. Emilyrose Darrah, MD 04/24/19 9:39 AM Medical Oncology and Hematology [facility] [address], [place] [address] Tel. [phone]    Fax. [phone]   I, Wilburn Mylar, am acting as scribe for Dr. Virgie Dad. Eric Morganti.  I, Lurline Del MD, have reviewed the above documentation for accuracy and completeness, and I agree with the above.     *Total Encounter Time as defined by the Centers for Medicare and Medicaid Services includes, in addition to the face-to-face time of a patient visit (documented in the note above) non-face-to-face time: obtaining and reviewing outside history, ordering and reviewing medications, tests or procedures, care coordination (communications with other health care professionals or caregivers) and documentation in the medical record.

## 2019-04-24 ENCOUNTER — Inpatient Hospital Stay (HOSPITAL_BASED_OUTPATIENT_CLINIC_OR_DEPARTMENT_OTHER): Payer: 59 | Admitting: Oncology

## 2019-04-24 ENCOUNTER — Inpatient Hospital Stay: Payer: 59

## 2019-04-24 ENCOUNTER — Other Ambulatory Visit: Payer: Self-pay

## 2019-04-24 ENCOUNTER — Inpatient Hospital Stay: Payer: 59 | Attending: Oncology

## 2019-04-24 ENCOUNTER — Encounter: Payer: Self-pay | Admitting: Adult Health

## 2019-04-24 VITALS — BP 115/68 | HR 71 | Temp 98.3°F | Resp 18 | Ht 63.0 in | Wt 116.9 lb

## 2019-04-24 DIAGNOSIS — C786 Secondary malignant neoplasm of retroperitoneum and peritoneum: Secondary | ICD-10-CM | POA: Diagnosis not present

## 2019-04-24 DIAGNOSIS — Z87442 Personal history of urinary calculi: Secondary | ICD-10-CM | POA: Diagnosis not present

## 2019-04-24 DIAGNOSIS — J90 Pleural effusion, not elsewhere classified: Secondary | ICD-10-CM | POA: Diagnosis not present

## 2019-04-24 DIAGNOSIS — C5701 Malignant neoplasm of right fallopian tube: Secondary | ICD-10-CM

## 2019-04-24 DIAGNOSIS — D1803 Hemangioma of intra-abdominal structures: Secondary | ICD-10-CM | POA: Diagnosis not present

## 2019-04-24 DIAGNOSIS — N838 Other noninflammatory disorders of ovary, fallopian tube and broad ligament: Secondary | ICD-10-CM | POA: Insufficient documentation

## 2019-04-24 DIAGNOSIS — Z8249 Family history of ischemic heart disease and other diseases of the circulatory system: Secondary | ICD-10-CM | POA: Insufficient documentation

## 2019-04-24 DIAGNOSIS — Z882 Allergy status to sulfonamides status: Secondary | ICD-10-CM | POA: Insufficient documentation

## 2019-04-24 DIAGNOSIS — R0602 Shortness of breath: Secondary | ICD-10-CM | POA: Diagnosis not present

## 2019-04-24 DIAGNOSIS — D259 Leiomyoma of uterus, unspecified: Secondary | ICD-10-CM | POA: Insufficient documentation

## 2019-04-24 DIAGNOSIS — Z90722 Acquired absence of ovaries, bilateral: Secondary | ICD-10-CM | POA: Insufficient documentation

## 2019-04-24 DIAGNOSIS — G620 Drug-induced polyneuropathy: Secondary | ICD-10-CM | POA: Diagnosis not present

## 2019-04-24 DIAGNOSIS — D701 Agranulocytosis secondary to cancer chemotherapy: Secondary | ICD-10-CM

## 2019-04-24 DIAGNOSIS — Z7289 Other problems related to lifestyle: Secondary | ICD-10-CM | POA: Insufficient documentation

## 2019-04-24 DIAGNOSIS — Z5111 Encounter for antineoplastic chemotherapy: Secondary | ICD-10-CM | POA: Diagnosis present

## 2019-04-24 DIAGNOSIS — D709 Neutropenia, unspecified: Secondary | ICD-10-CM | POA: Insufficient documentation

## 2019-04-24 DIAGNOSIS — Z8051 Family history of malignant neoplasm of kidney: Secondary | ICD-10-CM | POA: Insufficient documentation

## 2019-04-24 DIAGNOSIS — C762 Malignant neoplasm of abdomen: Secondary | ICD-10-CM

## 2019-04-24 DIAGNOSIS — D6481 Anemia due to antineoplastic chemotherapy: Secondary | ICD-10-CM | POA: Diagnosis not present

## 2019-04-24 DIAGNOSIS — Z79899 Other long term (current) drug therapy: Secondary | ICD-10-CM | POA: Insufficient documentation

## 2019-04-24 DIAGNOSIS — R188 Other ascites: Secondary | ICD-10-CM | POA: Diagnosis not present

## 2019-04-24 DIAGNOSIS — K56609 Unspecified intestinal obstruction, unspecified as to partial versus complete obstruction: Secondary | ICD-10-CM | POA: Insufficient documentation

## 2019-04-24 DIAGNOSIS — C801 Malignant (primary) neoplasm, unspecified: Secondary | ICD-10-CM

## 2019-04-24 DIAGNOSIS — D702 Other drug-induced agranulocytosis: Secondary | ICD-10-CM

## 2019-04-24 DIAGNOSIS — K219 Gastro-esophageal reflux disease without esophagitis: Secondary | ICD-10-CM | POA: Insufficient documentation

## 2019-04-24 DIAGNOSIS — E46 Unspecified protein-calorie malnutrition: Secondary | ICD-10-CM | POA: Insufficient documentation

## 2019-04-24 DIAGNOSIS — Z79811 Long term (current) use of aromatase inhibitors: Secondary | ICD-10-CM | POA: Diagnosis not present

## 2019-04-24 DIAGNOSIS — Z9079 Acquired absence of other genital organ(s): Secondary | ICD-10-CM | POA: Insufficient documentation

## 2019-04-24 DIAGNOSIS — Z823 Family history of stroke: Secondary | ICD-10-CM | POA: Insufficient documentation

## 2019-04-24 DIAGNOSIS — T451X5A Adverse effect of antineoplastic and immunosuppressive drugs, initial encounter: Secondary | ICD-10-CM | POA: Insufficient documentation

## 2019-04-24 DIAGNOSIS — Z170 Estrogen receptor positive status [ER+]: Secondary | ICD-10-CM | POA: Diagnosis not present

## 2019-04-24 DIAGNOSIS — Z803 Family history of malignant neoplasm of breast: Secondary | ICD-10-CM | POA: Insufficient documentation

## 2019-04-24 DIAGNOSIS — D696 Thrombocytopenia, unspecified: Secondary | ICD-10-CM | POA: Insufficient documentation

## 2019-04-24 DIAGNOSIS — Z85828 Personal history of other malignant neoplasm of skin: Secondary | ICD-10-CM | POA: Diagnosis not present

## 2019-04-24 DIAGNOSIS — Z808 Family history of malignant neoplasm of other organs or systems: Secondary | ICD-10-CM | POA: Insufficient documentation

## 2019-04-24 DIAGNOSIS — Z95828 Presence of other vascular implants and grafts: Secondary | ICD-10-CM

## 2019-04-24 LAB — CBC WITH DIFFERENTIAL/PLATELET
Abs Immature Granulocytes: 0.06 10*3/uL (ref 0.00–0.07)
Basophils Absolute: 0 10*3/uL (ref 0.0–0.1)
Basophils Relative: 0 %
Eosinophils Absolute: 0 10*3/uL (ref 0.0–0.5)
Eosinophils Relative: 0 %
HCT: 33.6 % — ABNORMAL LOW (ref 36.0–46.0)
Hemoglobin: 10.5 g/dL — ABNORMAL LOW (ref 12.0–15.0)
Immature Granulocytes: 1 %
Lymphocytes Relative: 16 %
Lymphs Abs: 1.6 10*3/uL (ref 0.7–4.0)
MCH: 28.4 pg (ref 26.0–34.0)
MCHC: 31.3 g/dL (ref 30.0–36.0)
MCV: 90.8 fL (ref 80.0–100.0)
Monocytes Absolute: 0.5 10*3/uL (ref 0.1–1.0)
Monocytes Relative: 5 %
Neutro Abs: 7.4 10*3/uL (ref 1.7–7.7)
Neutrophils Relative %: 78 %
Platelets: 327 10*3/uL (ref 150–400)
RBC: 3.7 MIL/uL — ABNORMAL LOW (ref 3.87–5.11)
RDW: 19.9 % — ABNORMAL HIGH (ref 11.5–15.5)
WBC: 9.5 10*3/uL (ref 4.0–10.5)
nRBC: 0 % (ref 0.0–0.2)

## 2019-04-24 LAB — COMPREHENSIVE METABOLIC PANEL
ALT: 11 U/L (ref 0–44)
AST: 17 U/L (ref 15–41)
Albumin: 3.5 g/dL (ref 3.5–5.0)
Alkaline Phosphatase: 108 U/L (ref 38–126)
Anion gap: 7 (ref 5–15)
BUN: 15 mg/dL (ref 6–20)
CO2: 26 mmol/L (ref 22–32)
Calcium: 9.2 mg/dL (ref 8.9–10.3)
Chloride: 104 mmol/L (ref 98–111)
Creatinine, Ser: 0.88 mg/dL (ref 0.44–1.00)
GFR calc Af Amer: >60 mL/min (ref >60)
GFR calc non Af Amer: >60 mL/min (ref >60)
Glucose, Bld: 91 mg/dL (ref 70–99)
Potassium: 4.5 mmol/L (ref 3.5–5.1)
Sodium: 137 mmol/L (ref 135–145)
Total Bilirubin: 0.2 mg/dL — ABNORMAL LOW (ref 0.3–1.2)
Total Protein: 7.2 g/dL (ref 6.5–8.1)

## 2019-04-24 MED ORDER — EXEMESTANE 25 MG PO TABS: 25.0000 mg | ORAL_TABLET | Freq: Every day | ORAL | 4 refills | Status: DC

## 2019-04-24 MED ORDER — SODIUM CHLORIDE 0.9% FLUSH
10.0000 mL | INTRAVENOUS | Status: DC | PRN
  Administered 2019-04-24: 10 mL
  Filled 2019-04-24: qty 10

## 2019-04-25 ENCOUNTER — Telehealth: Payer: Self-pay | Admitting: Oncology

## 2019-04-25 LAB — CA 125: Cancer Antigen (CA) 125: 6009 U/mL — ABNORMAL HIGH (ref 0.0–38.1)

## 2019-04-25 NOTE — Telephone Encounter (Signed)
Rescheduled 3/25 appts to 3/26 per 3/9 los. Left voicemail with new appt details.

## 2019-04-26 ENCOUNTER — Telehealth: Payer: Self-pay | Admitting: *Deleted

## 2019-04-26 ENCOUNTER — Other Ambulatory Visit: Payer: Self-pay | Admitting: Oncology

## 2019-04-26 NOTE — Progress Notes (Signed)
Kathryn Ramsey  [phone] Fax:(336) [phone]    ID: Kathryn Ramsey DOB: 1965/03/28  MR#: [id number]  NLZ#:[id number]  Patient Care Team: Harlan Stains, MD as PCP - General (Family Medicine) [name], MD as Consulting Physician (Obstetrics and Gynecology) Juanita Craver, MD as Consulting Physician (Gastroenterology) Servando Salina, MD as Consulting Physician (Obstetrics and Gynecology) Nickie Retort, MD as Consulting Physician (Urology) Gillis Ends, MD as Referring Physician (Obstetrics and Gynecology) Mettu, Baltazar Apo, MD as Referring Physician (Oncology) OTHER MD: Festus Aloe [phone]) Floyde Parkins  NOTE: Patient does not want to be informed of her CA-125.  CHIEF COMPLAINT:  ovarian cancer  CURRENT TREATMENT: Abraxane, bevacizumab   INTERVAL HISTORY: Kathryn Ramsey returns today for follow-up and treatment of her recurrent ovarian cancer accompanied by her husband Kathryn Ramsey.  She has been receiving treatment with Abraxane and Bevacizumab given on day 1 of a 21 day cycle and Abraxane alone given on day 8 of a 21 day cycle.  Today is cycle 4 day 1 of treatment.   However since her last visit, she underwent abdomen/pelvis CT yesterday, 04/23/2019 showing evidence of disease progression (detailed below).  Note she had to receive Zarxio (Neupogen biosimilar) due to chemotherapy induced neutropenia on 2/3, 2/4, and 2/5.  She had body aches, fever, chills, headaches afterward, and mild itching.  She has really has a difficult time tolerating thsi injection as compared to Granix and is asking to receive granix instead in the future, if insurance would graciously allow.   On 04/17/2019 she underwent ultrasound-guided paracentesis at [facility] with removal of 650 cc of milky yellow fluid.  That completely obliterated the pocket.  She felt better after the procedure, with less nausea in particular and did not have pain right after the procedure as  she has had in the past  She is here today to discuss next steps.  REVIEW OF SYSTEMS: Kathryn Ramsey is not having normal bowel movements and normal bladder function.  There has been no jaundice or evidence of biliary tract obstruction.  She does yoga at least 4 times a week and is planning to go back to her walking program.  She feels she can easily do 3 miles on a cool day day on a flat surface.  She has discomfort in her abdomen and sometimes when she stands for long time or walks there is a little bit of pocket in the left inguinal area which disappears when she sits or lies down.  If the discomfort is significant she will take a Tylenol and Aleve but she has not done this more than once a day so far and does not do that every day.  She is sleeping well, taking a little bit of magnesium at bedtime.   HISTORY OF PRESENT ILLNESS: From Dr. Serita Grit original intake note 03/17/2015:  "Kathryn Ramsey is a very pleasant G4P4 who is seen in consultation at the request of Dr Collene Mares and Dr Garwin Brothers for peritoneal carcinomatosis. The patient has a history of a workup by urology for gross hematuria which included a pelvic examine was suggested for extrinsic mass effect on the bladder on cystoscopy. Cystoscopy took place on in December 2016. The patient denies abdominal pain bloating early satiety or abdominal distention.  On 08/03/2015 at [facility] she underwent a CT scan of the abdomen and pelvis as ordered by Dr Baruch Gouty. This revealed a 1.4 cm low attenuation lesion on the posterior right hepatic lobe suggestive of a hemangioma. Status post hysterectomy. No ovarian  masses. Moderate ascites. Omental caking seen in the lateral left abdomen and pelvis. Peritoneal nodularity in the left lateral pelvic cul-de-sac. No gross extrinsic compression on the bladder was identified on imaging.  The patient was then seen by her gastroenterologist, Dr. Collene Mares, who performed a colonoscopy which was unremarkable.  Tumor  markers were drawn on 08/04/2015 and these included a CA-125 that was elevated to 957, and a CEA that was normal at 1."  On 03/27/2015 the patient underwent diagnostic laparoscopy, exploratory laparotomy with bilateral salpingo-oophorectomy, omentectomy, and radical tumor debulking with optimal cytoreduction (R0) of what proved to be a stage IIIc right fallopian tube cancer. She was treated adjuvantly with carboplatin and paclitaxel, as detailed below. Her CA 125 dropped from 1226 on 03/20/2015 to 26.1 by 06/16/2015.  Her subsequent history is as detailed above.   PAST MEDICAL HISTORY: Past Medical History:  Diagnosis Date  . Acute sensory neuropathy 06/26/2015  . Allergy   . Anemia   . Asthma    illness induced asthma  . Blood transfusion without reported diagnosis    at age 35 years old D/T surgery to femur being crushed  . 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 Heart murmur    pt, states had a "working heart murmur"  . History of kidney stones   . Neutropenia, drug-induced ([place]) 03/10/2018  . PONV (postoperative nausea and vomiting)   . Skin cancer     PAST SURGICAL HISTORY: Past Surgical History:  Procedure Laterality Date  . 3 laporscopic proceedures    . ABDOMINAL HYSTERECTOMY     2010  . CHOLECYSTECTOMY     2010  . DILATION AND CURETTAGE OF UTERUS     1997  . IR IMAGING GUIDED PORT INSERTION  03/08/2018  . LAPAROSCOPY N/A 03/27/2015   Procedure: DIAGNOSTIC LAPAROSCOPY ;  Surgeon: [name], MD;  Location: [facility];  Service: Gynecology;  Laterality: N/A;  . LAPAROSCOPY N/A 02/24/2016   Procedure: LAPAROSCOPY DIAGNOSTIC WITH PERITONEAL WASHINGS AND PERITONEAL BIOPSY;  Surgeon: [name], MD;  Location: [facility];  Service: Gynecology;  Laterality: N/A;  . LAPAROTOMY N/A 03/27/2015   Procedure: EXPLORATORY LAPAROTOMY, BILATERAL SALPINGO OOPHORECTOMY, OMENTECTOMY, RADICAL TUMOR DEBULKING;  Surgeon: [name], MD;  Location: [facility];  Service: Gynecology;  Laterality: N/A;  . sinus surgery 1994      FAMILY HISTORY Family History  Problem Relation Age of Onset  . Hypertension Father   . Skin cancer Father        nonmelanoma skin cancers in his late 42s  . Non-Hodgkin's lymphoma Maternal Aunt        dx. 82s; smoker  . Stroke Maternal Grandmother   . Other Mother        benign meningioma dx. early-mid-70s; hysterectomy in her late 38s for heavy periods - still has ovaries  . Other Son        one son with pre-cancerous skin findings  . Other Daughter        cysts on ovaries and hx of heavy periods  . Other Sister        hysterectomy for cysts - still has ovaries  . Heart attack Maternal Grandfather   . Heart attack Paternal Grandfather   . Renal cancer Maternal Aunt 60       smoker  . Breast cancer Maternal Aunt 78  . Other Maternal Aunt  dx. benign brain tumor (meningioma) at 5; 2nd benign brain tumor in her late 57s; hx of radical hysterectomy at age 28  . Brain cancer Other        NOS tumor  The patient's parents are both living, in their late 38s as of January 2018. The patient has one brother, 3 sisters. There is no history of ovarian cancer in the family. One maternal aunt had breast and kidney cancer, another had non-Hodgkin's lymphoma.   GYNECOLOGIC HISTORY:  No LMP recorded. Patient has had a hysterectomy. 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 Her husband Kathryn Ramsey is a Engineer, maintenance (IT). Their children are 74, 47, 35 and 68 y/o as of JAN 2018. The oldest works as an Electrical engineer in the [employer] in [place]. The other 3 children live in [place], the youngest currently attending [employer]. The patient has no grandchildren. She attends [employer]   ADVANCED DIRECTIVES: Dianelly has completed advanced directives and they are filed.  Her husband Kathryn Ramsey is her  healthcare power of attorney.  HEALTH MAINTENANCE: Social History   Tobacco Use  . Smoking status: Never Smoker  . Smokeless tobacco: Never Used  Substance Use Topics  . Alcohol use: Yes    Comment:  3 glasses wine or beer/week  . Drug use: No    Colonoscopy:2016  PAP: s/p hyst  Bone density:   Allergies  Allergen Reactions  . Bee Venom Shortness Of Breath    swelling  . Prochlorperazine Edisylate Anaphylaxis, Anxiety and Palpitations    Anaphylaxis, Anxiety and Palpitations   . Sulfa Antibiotics Shortness Of Breath    Vomit , diarrhea , hives  . Zarxio [Filgrastim]     Current Outpatient Medications  Medication Sig Dispense Refill  . acetaminophen (TYLENOL) 500 MG tablet Take 2 tablets by mouth 3 (three) times daily as needed.    [name] albuterol (VENTOLIN HFA) 108 (90 Base) MCG/ACT inhaler Inhale into the lungs.    . diphenhydrAMINE (BENADRYL) 50 MG capsule 50 mg Diphenhydramine (Benadryl) PO within 1 hour of the injection 1 capsule 0  . exemestane (AROMASIN) 25 MG tablet Take 1 tablet (25 mg total) by mouth daily after breakfast. 90 tablet 4  . fluticasone (FLONASE) 50 MCG/ACT nasal spray Place 1 spray into both nostrils 2 (two) times daily. (Patient taking differently: Place 1 spray into both nostrils as needed. ) 16 g 2  . gabapentin (NEURONTIN) 100 MG capsule 200 mg bid with 300 mg qhs 630 capsule 1  . ibuprofen (ADVIL) 600 MG tablet Take 1 tablet by mouth 4 (four) times daily as needed.    . lidocaine-prilocaine (EMLA) cream APPLY TOPICALLY AS NEEDED. 30 g 0  . loratadine (CLARITIN) 10 MG tablet Take 10 mg by mouth as needed.     . magnesium oxide (MAG-OX) 400 (241.3 Mg) MG tablet Take 1 tablet (400 mg total) by mouth daily. 60 tablet 3  . Multiple Vitamins-Minerals (MULTIVITAMIN ADULT PO) Take 1 tablet by mouth daily.    . Omega 3 1000 MG CAPS Take 1 capsule by mouth daily.    [name] omeprazole (PRILOSEC) 40 MG capsule Take 1 capsule (40 mg total) by mouth daily. 30 capsule 5    . ondansetron (ZOFRAN) 8 MG tablet     . senna-docusate (SENOKOT-S) 8.6-50 MG tablet Take 1-2 tablets by mouth 2 (two) times daily as needed.    [name]  sertraline (ZOLOFT) 50 MG tablet Take 1.5 tablets (75 mg total) by mouth daily. Increase dose to 75 mg daily 45 tablet 3  . traMADol (ULTRAM) 50 MG tablet Take 1 tablet (50 mg total) by mouth every 6 (six) hours as needed. 60 tablet 0  . valACYclovir (VALTREX) 1000 MG tablet Take 1 tablet (1,000 mg total) by mouth daily. 5 tablet 0  . VENTOLIN HFA 108 (90 Base) MCG/ACT inhaler     . vitamin B-12 (CYANOCOBALAMIN) 1000 MCG tablet Take 1 tablet by mouth daily.     No current facility-administered medications for this visit.   Facility-Administered Medications Ordered in Other Visits  Medication Dose Route Frequency Provider Last Rate Last Admin  . heparin lock flush 100 unit/mL  500 Units Intravenous Once Raahil Ong, Virgie Dad, MD      . sodium chloride flush (NS) 0.9 % injection 10 mL  10 mL Intravenous PRN Jesiah Grismer, Virgie Dad, MD        OBJECTIVE: Middle-aged white woman in no acute distress  There were no vitals filed for this visit.   There is no height or weight on file to calculate BMI.   There were no vitals filed for this visit.  ECOG FS:1 - Symptomatic but completely ambulatory  Sclerae unicteric, EOMs intact Wearing a mask No cervical or supraclavicular adenopathy Lungs no rales or rhonchi Heart regular rate and rhythm Abd soft, minimally distended, nontender, positive bowel sounds; the area of port site recurrences are flat and show no erythema or tenderness. MSK no focal spinal tenderness Neuro: nonfocal, well oriented, positive affect Breasts: Deferred   LAB RESULTS:  CMP     Component Value Date/Time   NA 137 04/24/2019 0835   NA 140 02/11/2017 0755   K 4.5 04/24/2019 0835   K 4.4 02/11/2017 0755   CL 104 04/24/2019 0835   CO2 26 04/24/2019 0835   CO2 26 02/11/2017 0755   GLUCOSE 91 04/24/2019 0835   GLUCOSE 93  02/11/2017 0755   BUN 15 04/24/2019 0835   BUN 7.9 02/11/2017 0755   CREATININE 0.88 04/24/2019 0835   CREATININE 1.06 (H) 06/19/2018 0931   CREATININE 0.7 02/11/2017 0755   CALCIUM 9.2 04/24/2019 0835   CALCIUM 9.6 02/11/2017 0755   PROT 7.2 04/24/2019 0835   PROT 7.5 02/11/2017 0755   ALBUMIN 3.5 04/24/2019 0835   ALBUMIN 4.4 02/11/2017 0755   AST 17 04/24/2019 0835   AST 22 06/19/2018 0931   AST 27 02/11/2017 0755   ALT 11 04/24/2019 0835   ALT 13 06/19/2018 0931   ALT 24 02/11/2017 0755   ALKPHOS 108 04/24/2019 0835   ALKPHOS 58 02/11/2017 0755   BILITOT 0.2 (L) 04/24/2019 0835   BILITOT 0.2 (L) 06/19/2018 0931   BILITOT 0.43 02/11/2017 0755   GFRNONAA >60 04/24/2019 0835   GFRNONAA 59 (L) 06/19/2018 0931   GFRAA >60 04/24/2019 0835   GFRAA >60 06/19/2018 0931    INo results found for: SPEP, UPEP  Lab Results  Component Value Date   WBC 9.5 04/24/2019   NEUTROABS 7.4 04/24/2019   HGB 10.5 (L) 04/24/2019   HCT 33.6 (L) 04/24/2019   MCV 90.8 04/24/2019   PLT 327 04/24/2019      Chemistry      Component Value Date/Time   NA 137 04/24/2019 0835   NA 140 02/11/2017 0755   K 4.5 04/24/2019 0835   K 4.4 02/11/2017 0755   CL 104 04/24/2019 0835   CO2 26 04/24/2019 0835  CO2 26 02/11/2017 0755   BUN 15 04/24/2019 0835   BUN 7.9 02/11/2017 0755   CREATININE 0.88 04/24/2019 0835   CREATININE 1.06 (H) 06/19/2018 0931   CREATININE 0.7 02/11/2017 0755      Component Value Date/Time   CALCIUM 9.2 04/24/2019 0835   CALCIUM 9.6 02/11/2017 0755   ALKPHOS 108 04/24/2019 0835   ALKPHOS 58 02/11/2017 0755   AST 17 04/24/2019 0835   AST 22 06/19/2018 0931   AST 27 02/11/2017 0755   ALT 11 04/24/2019 0835   ALT 13 06/19/2018 0931   ALT 24 02/11/2017 0755   BILITOT 0.2 (L) 04/24/2019 0835   BILITOT 0.2 (L) 06/19/2018 0931   BILITOT 0.43 02/11/2017 0755       No results found for: LABCA2  No components found for: LABCA125  No results for input(s): INR in the  last 168 hours.  Urinalysis    Component Value Date/Time   COLORURINE STRAW (A) 11/22/2017 0940   APPEARANCEUR CLEAR 11/22/2017 0940   LABSPEC 1.002 (L) 11/22/2017 0940   LABSPEC 1.005 10/09/2015 1239   PHURINE 7.0 11/22/2017 0940   GLUCOSEU NEGATIVE 11/22/2017 0940   GLUCOSEU Negative 10/09/2015 1239   HGBUR NEGATIVE 11/22/2017 0940   BILIRUBINUR NEGATIVE 11/22/2017 0940   BILIRUBINUR Negative 10/09/2015 1239   KETONESUR NEGATIVE 11/22/2017 0940   PROTEINUR NEGATIVE 04/03/2019 1050   UROBILINOGEN 0.2 10/09/2015 1239   NITRITE NEGATIVE 11/22/2017 0940   LEUKOCYTESUR NEGATIVE 11/22/2017 0940   LEUKOCYTESUR Negative 10/09/2015 1239    STUDIES: CT Abdomen Pelvis W Contrast  Result Date: 04/23/2019 CLINICAL DATA:  Ovarian cancer staging. EXAM: CT ABDOMEN AND PELVIS WITH CONTRAST TECHNIQUE: Multidetector CT imaging of the abdomen and pelvis was performed using the standard protocol following bolus administration of intravenous contrast. CONTRAST:  35m OMNIPAQUE IOHEXOL 300 MG/ML  SOLN COMPARISON:  09/28/2018 FINDINGS: Lower chest: Signs of basilar atelectasis in the setting of a moderately large left-sided pleural effusion question mild pleural thickening (image 12, series 2) approximately 3 mm greatest thickness. Associated volume loss in the left lower lobe as above. Hepatobiliary: Hemangioma in the posterior right hepatic lobe as before. Scalloped hepatic margins due to moderate volume of peritoneal fluid that has developed since the previous exam. No signs of intraparenchymal hepatic lesion. The portal vein remains patent. Post cholecystectomy. Pancreas: Pancreas is unremarkable without signs of focal lesion, ductal dilation or inflammation. Spleen: The spleen is normal aside from fluid which surrounds the splenic margin anteriorly. Adrenals/Urinary Tract: Adrenal glands are normal. Kidneys with smooth bilateral renal contours and no suspicious, focal hip renal lesion. Stomach/Bowel: Bowel  loops in the abdomen are surrounded by peritoneal fluid. Thickening of mesenteric reflections and tethered appearance of bowel loops particularly the distal sigmoid colon and small bowel loops in the pelvis (image 63, series 2) these loops are held in close apposition with thickening of fascial in mesenteric planes, as much as 4 mm greatest thickness in this area. Clustered bowel loops with respect to mid and distal small bowel in the lower and central abdomen/upper pelvis. Wall there is some mild distension there is no overt evidence of obstruction with contrast passing into the distal small bowel and into the proximal and mid colon. Appendix not visualized. Vascular/Lymphatic: No signs of significant atherosclerotic change in the abdominal aorta. No signs of aneurysmal dilation. No adenopathy in the retroperitoneum or in the upper abdomen. No signs of adenopathy in the pelvis. Reproductive: Post hysterectomy. Other: Peritoneal thickening throughout. Signs of nodularity as well. (Image  27, series 2) 4 mm nodule in the left upper quadrant adjacent to spleen and colon. (Image 70, series 2) 13 x 17 mm nodule in the cul-de-sac previously approximately 9 x 14 mm with some fullness of the right vaginal apex as well. Musculoskeletal: No signs of acute bone finding or evidence of destructive bone process. IMPRESSION: 1. Interval development of moderate volume peritoneal fluid. 2. Signs of peritoneal carcinomatosis. The largest nodule in the cul-de-sac measures 13 x 17 mm, previously 9 x 14 mm. 3. Mesenteric and peritoneal thickening with angulated bowel loops particularly mid to distal small bowel and rectosigmoid colon. Mild dilation of small bowel loops may indicate mild partial small bowel obstruction. There are no overt signs of obstruction currently. 4. Signs of basilar atelectasis in the setting of a moderately large left-sided pleural effusion. Moderate pleural effusion may be indicative of malignant pleural fluid  based on appearance and other findings on the exam. These results will be called to the ordering clinician or representative by the Radiologist Assistant, and communication documented in the PACS or zVision Dashboard. Electronically Signed   By: Zetta Bills M.D.   On: 04/23/2019 08:42     ELIGIBLE FOR AVAILABLE RESEARCH PROTOCOL:  no  ASSESSMENT: 54 y.o. BRCA negative Kathryn Ramsey woman status post radical tumor debulking with optimal cytoreduction (R0) 03/27/2015 for a stage IIIC, high-grade right fallopian tube carcinoma  (a) baseline CA-125 was 1226.  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)  (c) tumor is PD-L1 negative (02/24/2016)  (d) tumor is strongly estrogen receptor positive, progesterone receptor negative (02/24/2016)  (1) adjuvant chemotherapy consisted of carboplatin and paclitaxel for 6 doses, begun 04/14/2015, completed 08/11/2015  (a) paclitaxel was omitted from cycle 5 and dose reduced on cycle 6 because of neuropathy   (b) a "make up" dose of paclitaxel was given 08/25/2015  (c) last carboplatin dose was 08/11/2015  (d) CA-125 normalized by 06/16/2015   (2) FIRST RECURRENCE: January 2018  (a) CA-125 rise beginning November 2017 led to CT scans and PET scans December 2017, all negative  (b) exploratory laparotomy 02/24/2016 showed pathologically confirmed recurrence, with miliary disease involving all examined surfaces  (c) port-site metastases noted 03/08/2016  (d) the January 2018 tumor sample was tested for the estrogen receptor and he was 80 percent positive with strong staining, progesterone receptor negative  (3) carboplatin/liposomal doxorubicin started 03/05/2016, repeated every three weeks x4, completed 06/04/2016.  (a) cycle 3 delayed one week because of neutropenia; OnPro added  (b) CA 125  normalized after cycle 3  (4) RUCAPARIB maintenance started 07/02/2016  (a) restaging 10/01/2016: Normal CA 125, negative CT of the abdomen and pelvis  (b) labs 11/24/2016 shows a rise in her CA 125-43.4, with continuing rise thereafter  (c) rucaparib discontinued October 2018 with rising CA 125  (5) anastrozole started December 23, 2016-stopped after a couple of weeks with poor tolerance  SECOND RECURRENCE: (6) Gemcitabine/Bevacizumab started on 02/04/2017  (a) gemcitabine omitted cycle 4 because of intercurrent infection  (b) gemcitabine resumed with cycle 5, with intervening rise in  Ca 125  (c) repeat CT scan of the abdomen and pelvis on 05/26/2017 does not confirm obvious disease progression  (d) cycle 5 of gemcitabine/bevacizumab delayed 1 week   (e) gemcitabine/ bevacizumab discontinued after 6 cycles, with rising CA 125 (last dose 06/24/2017)  (7) foundation one testing did not show  a high mutation burden and the microsatellite status was stable.  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  These were not immediately actionable  (8) Abraxane started 07/12/2017, given weekly or weeks 1 and 2 of each 3 weeks cycle, last dose 11/15/2017 (5 cycles)  (a) cycle 2 started 08/09/2017, will be day 1 day 8 only  (b) cycle 3 scheduled to start 09/13/2017, day 1 day 8 at patient request  (c) CT of the abdomen and pelvis 11/28/2017 shows no measurable disease  (d) PET scan 01/25/2018 and MRI of the pelvis 02/03/2018 show no measurable disease  (e) patient has symptomatic ascites and a rapidly rising Ca1 25  (9) started cisplatin/gemcitabine days 1 and 8 of each 21-day cycle on 02/18/2018  (a) switched to carboplatin/ gemcitabine 07/17/2018 due to neuropathy  (b) carbo/gemzar changed to Q14 days 07/24/2018 due to neutropenia  (c) considered niraparib/Zejula 100 mg, 2 tabs daily at the completion of chemotherapy  (10) associated problems  (a) anemia due to chemotherapy: On Aranesp  (b)  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 10/25/18 Consenting for AMV-564 clinical trial 21. 11/13/18 AMV-564 initiation 75 mcg dose 22. 10/8 Dropped down to 50 mcg dose (I think that date is correct) 23. 10/19-21/20 Tx held due to Neutropenia 24. 12/06/18 Given Neulasta 25. 12/07/18 AMV-564 resumed 26. 01/03/19 CT C/A/P 10% RECIST progression 27. 01/15/19 Increased dose to 75 mcg/daily 28. 01/17/19 Thoracentesis with cytology c/w Mullerian primary  29. Paracentesis 02/05/2019: cytology c/w adenocarcinoma  (12) schedule of thoracenteses:  (a) left 01/17/2019 [facility])  (b) left 02/06/2019 [facility])  (c)   (13) schedule of paracenteses:  (a) 01/30/2019 ([place])  (b) 02/06/2019 ([place])  (c) 02/18/2018 ([place])  (d) 04/17/2019 ([place])  (14) abraxane started 02/20/2019, repeated days 1 and 8 of every 21 day cycle  (a) bevacizumab/Avastin started 02/20/2019 and repeated every 21 days  (b) She cannot tolerate Zarxio due to side effects consisting of low grade fever, body aches, malaise, fatigue, and itching, whereas she has been able to tolerate Granix though  (c) bevacizumab and Abraxane discontinued after 04/10/2019 dose with evidence of progression  (15) exemestane started 04/24/2019  (16) considering: Phase 1 studies; P 32 installation; return to carbo/Doxil; supplements  PLAN: Ercia's cancer did not respond to Abraxane and bevacizumab and we are discontinuing that.  We know her tumor is strongly estrogen receptor positive.  Even though that does not guarantee a response, while we are waiting to see which way to go I think it is very reasonable for her to give exemestane a try.  We discussed the possible toxicities side effects and complications of this agent I have put in the prescription for her to start it.  Ambri is willing to travel if that is what it  takes for her to participate in a promising phase 1 study.  I have discussed that with Dr. Theora Gianotti at [facility] and she will follow-up on that and let us know what is available.  Tu herself would be interested in going back to carbo/Doxil which she received a couple years ago with good response.  I am not sure if we are still doing P 32 installations but that is another idea we could consider.  Optimally though she would participate in a trial and that is what we hope to have clarified within the next 2 to 3 weeks  She will  see me again March 26 and we will catch up at that time  Total encounter time 35 minutes.Sarajane Jews C. Jamiel Goncalves, MD 04/26/19 9:11 AM Medical Oncology and Hematology [facility] [place], [place] [address] Tel. [phone]    Fax. [phone]   I, Wilburn Mylar, am acting as scribe for Dr. Virgie Dad. Arhan Mcmanamon.  I, Lurline Del MD, have reviewed the above documentation for accuracy and completeness, and I agree with the above.     *Total Encounter Time as defined by the Centers for Medicare and Medicaid Services includes, in addition to the face-to-face time of a patient visit (documented in the note above) non-face-to-face time: obtaining and reviewing outside history, ordering and reviewing medications, tests or procedures, care coordination (communications with other health care professionals or caregivers) and documentation in the medical record.

## 2019-04-26 NOTE — Telephone Encounter (Signed)
Called and scheduled the patient for a follow up appt with Dr Denman George

## 2019-05-01 ENCOUNTER — Inpatient Hospital Stay: Payer: 59

## 2019-05-01 ENCOUNTER — Inpatient Hospital Stay (HOSPITAL_BASED_OUTPATIENT_CLINIC_OR_DEPARTMENT_OTHER): Payer: 59 | Admitting: Gynecologic Oncology

## 2019-05-01 ENCOUNTER — Other Ambulatory Visit: Payer: Self-pay

## 2019-05-01 ENCOUNTER — Encounter: Payer: Self-pay | Admitting: Gynecologic Oncology

## 2019-05-01 VITALS — BP 109/75 | HR 81 | Temp 98.2°F | Resp 17 | Ht 63.0 in | Wt 115.4 lb

## 2019-05-01 DIAGNOSIS — C569 Malignant neoplasm of unspecified ovary: Secondary | ICD-10-CM | POA: Diagnosis not present

## 2019-05-01 DIAGNOSIS — Z5111 Encounter for antineoplastic chemotherapy: Secondary | ICD-10-CM | POA: Diagnosis not present

## 2019-05-01 DIAGNOSIS — M6289 Other specified disorders of muscle: Secondary | ICD-10-CM | POA: Diagnosis not present

## 2019-05-01 NOTE — Progress Notes (Signed)
Gynecologic Oncology Follow-up Note  Chief Complaint:  Chief Complaint  Patient presents with  . Malignant neoplasm of ovary, unspecified laterality ([place])    Follow up   Assessment:    54 y.o. 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   S/p 8th line therapy. Currently on the anti-estrogen, Examestane.   Measurable disease with large volume ascites and left pleural effusion and deep pelvic cul de sac <2cm nodules. No bulky disease. 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 ECOG performance status 1.   Plan: I reviewed Paiton's CT images with her husband and her. We talked about the distribution of her disease (low volume, widespread, involving vital unresectable structures such as mesentery).  She expressed a strong desire to attempt further therapy (either conventional standard of care vs clinical trials). I explained that she is likely a poor candidate for most clinical trials given how many prior treatments she has received and the lack of identification of an actionable mutation in her tumor molecular testing.  She enquired about the role of surgery and has "heard about other women who have had multiple debulkings over the years and I feel that debulking would buy me some time". Reviewing her images I explained why her distribution of disease is not amenable to a debulking effort and that debulking only serves to reduce tumor volume on which chemotherapy can act.  Surgery alone for wide spread disease does not induce "remission" and certainly not cure. I explained that given that her tumor is so chemoresistant, even if there were dominant masses/nodules to debulk, there would be no good therapeutics available for her with postop to consolidate the surgical effort. She enquired regarding whether the deep cul de sac nodules were causing  her fluid accumulation and I explained that I did not think they were and that this fluid is a result of the widespread carcinomatosis and diaphragmatic involvement of her tumor.   I raised with her the option of no further therapy. I discussed that it is reasonable at this point to maximize quality of life-oriented efforts and discontinue attempts at chemotherapy. Marcayla expressed that she is not at all interested in that approach.  She strongly desires additional therapy.   I explained that based on my assessment of her functional status (ECOG 1) and her lab evaluations which are acceptable, she remains a candidate for attempts at 9th line therapy. I explained that the chances of any response, including temporary, are low, even negligible. However, provided her underlying medical status permits administration of cytotoxics, it is reasonable to consider them, with palliative intent. I explained that there would come a time in which the symptoms from her disease progression (eg bowel obstruction, malnutrition, weakness) would mean that she was no longer a candidate for additional chemotherapy, however I do not feel strongly that this time has been yet reached. I suspect it will come in the near future.   She is interested in retrying Doxil which she has not "seen" for approximately 2 years. This could be tried as a single agent or doublet with platinum. I explained that the caveats are that Doxil typically can be slow to demonstrate response, which, in her case of symptomatic ascites, may not be feasible. Additionally, she has seen a great deal of carboplatin and stands a higher risk for severe reaction. Finally, she has been treated with these agents before and therefore her tumor  has shown resistance to these agents already, therefore chances of a clinically meaningful response are low.  Understanding all of these caveats, she is still interested in pursuing Doxil +/- carboplatin, which I feel is  reasonable.  I explained that there were still other potential drugs to try, all of which have fairly low response rates. These include Topotecan, Navelbine, Hexalen, Cytoxan.   She will consider her options, discuss them further with Dr Theora Gianotti and then discuss with Dr Jana Hakim.  I suggested that if she desires additional chemotherapy (rather than hospice care) she should consider initiating treatment within the next 3-4 weeks as her disease progression during this time is assured and she may lose the window in which she is a medical candidate for trial of additional therapies.   It is important to note that if Sharicka were to present to the hospital with symptoms of bowel obstruction, she would not be considered by me to be a candidate for surgical relief of the obstruction. It would be my recommendation to proceed with palliative G-tube decompression (or NGT if life expectancy was <7month.  HPI/Tumor history:  MOrianna Biskupis a very pleasant 54year old G4P4 who was originally seen in consultation on 03/17/15 at the request of Dr MCollene Maresand Dr CGarwin Brothersfor peritoneal carcinomatosis. The patient had a history of a workup by urology for gross hematuria which included a pelvic examine was suggested for extrinsic mass effect on the bladder on cystoscopy. Cystoscopy took place on in December 2016. The patient denied abdominal pain bloating early satiety or abdominal distention.  On 08/03/2015 at [facility] she underwent a CT scan of the abdomen and pelvis as ordered by Dr BBaruch Gouty This revealed a 1.4 cm low attenuation lesion on the posterior right hepatic lobe suggestive of a hemangioma. Status post hysterectomy. No ovarian masses. Moderate ascites. Omental caking seen in the lateral left abdomen and pelvis. Peritoneal nodularity in the left lateral pelvic cul-de-sac. No gross extrinsic compression on the bladder was identified on imaging.  The patient was then seen by her gastroenterologist, Dr.  MCollene Mares who performed a colonoscopy which was unremarkable.  Tumor markers were drawn on 08/04/2015 and these included a CA-125 that was elevated to 957, and a CEA that was normal at 1.  The patient was otherwise a very healthy woman. She is an [employer] trader She has had a history of 4 spontaneous vaginal deliveries. She had a remote history of endometriosis. She had a limited history of oral contraceptive pill usage (possibly 2 months). She a history of primary infertility and was treated with Clomid for her first pregnancy.  Her prior endometriosis was identified and treated with 3 laparoscopies. Her only other abdominal surgery was a laparoscopic cholecystectomy, and an LAVH on March 15th 2010 with removal of a right paratubal cyst for a fibroid uterus and menorrhagia. This was performed by Dr. SServando Salina   She has no remarkable history for malignancy. Her maternal aunt had non-hodgkins lymphoma.  On 03/27/15 she underwent diagnostic laparoscopy, exploratory laparotomy, BSO, omentectomy, argon beam ablation of tumor implants for an optimal cytoreduction (R0) for high grade serous fallopian tube cancer. Final pathology confirmed that her primary tumor was the right fallopian tube. She had an uncomplicated postoperative course.  She went on to receive 6 cycles of carboplatin and paclitaxel adjuvant therapy between 04/14/15 and 08/11/15. She developed severe neuropathy symptoms which necessitated treatment delay of cycle 5 and weekly scheduling of taxol on cycle 6.  CA 125 normalized  quickly during primary treatment:  1226 on 03/20/15 165 on 04/28/15 44 on 05/22/15 26.1 on 06/16/15 18.7 on 08/11/15 17 on 7/17/7 15 on 09/29/15.  Post-treatment imaging with CT chest,abdo, pelvis on 09/29/15 showed complete resolution of ascites and peritoneal implants. There was a stable 1.5cm liver hemangioma present.  On 01/06/16 she felt unwell with a headache and requested labs. On 01/07/16 CA 125 was  noted to be elevated at 69.1.  On 01/16/16 she had a CT scan which showed no measurable recurrence. On 01/26/16 a PET/CT showed no measurable recurrence.  Repeat CA 125 on 02/13/16 was substantially elevated at 914.   On 02/24/16 she was taken to the operating room for a diagnostic laparoscopy which confirmed low volume carcinomatosis. This was biopsied and confirmed to be high grade serous carcinoma, consistent with recurrent ovarian cancer.  Since 02/28/15 she began noticing increasing firmness and lumps around the 2 right lower quadrant 37m port sites. These areas were felt to represent port-site metastases. They were not biopsied, but subsequently decreased with salvage chemotherapy.  Salvage (second line) Carboplatin and Doxil started 03/05/16.  CA 125 was 577 on day 1 of cycle 1 (03/05/16). Cycle 4 carb/Doxil was given on 06/04/16.  On 06/18/16 CA 125 had normalized to 27. CT abdo/pelvis on 06/17/16 showed no measurable disease.  She was started on Rucaparib PARP inhibitor maintenance therapy in May, 2018 which she tolerated well.  Follow-up CT abdo/pelvis in August, 2018 showed no measurable disease. CA 125 on 09/29/16: 23 CA 125 on 11/01/16: 29.5 CA 125 on 11/24/16: 43 representing possible recurrence and Rucaparib was discontinued.  Imaging CT abd/pelvis 11/30/17 showed slight growth of a 0.9 cm left paracolic gutter nodule, compatible with recurrent peritoneal metastasis. No additional discrete peritoneal nodules. No ascites. No adnexal masses.  She was prescribed anastrozole therapy between November 8th, 2018- November 30th, 2018.  Gembcitabine/Bevacizumab was started 02/04/17 x 6 cycles until 5/10/1.  CA 125 initially responded but then increased on cycle 5 and 6 demonstrating progression. CA 125 on 06/17/16 was 609.9.  [employer] One testing performed - no actionable mutations.  Abraxane (weekly) was started on 07/12/17. CA 125 responded and was 365 on 07/26/17, 217 on 08/09/17, 114  on 08/16/17, 49 on 09/12/17, 39.5 on 10/10/17. On 11/07/17 the CA 125 elevated to 39 and therefore a CT of the abdomen and pelvis 11/28/2017 was performed but showed no measurable disease.  PET scan 01/25/2018 and MRI of the pelvis 02/03/2018 showed no measurable disease.  Abraxane was discontinued on 11/15/2017 (after a total of 5 cycles) after confirmation of disease progression with rapidly rising CA 125 (1,372 on 01/17/19) and the development of symptomatic ascites.  She was started on 6th line therapy with cisplatin/gemcitabine days 1 and 8 of each 21-day cycle on 02/18/2018. This was switched to carboplatin/ gemcitabine 07/17/2018 due to neuropathy, and was changed to Q14 days 07/24/2018 due to neutropenia.  PET on 06/21/18 showed overall improvement in peritoneal metastasis compared to PET-CT scan 01/24/2018. Reduced surface area of metabolic peritoneal metastasis along the RIGHT hepatic margin. Resolution of hypermetabolic peritoneal thickening along the inferior margin of the spleen. Marked improvement in hypermetabolic peritoneal thickening and peritoneal fluid within the pelvis. Single solitary new calcified nodule in the deep peritoneal fat LEFT adjacent to the rectum.  CA 125's fell to 99 on 07/03/18, remaining stable in the 100 range through the summer of 2020, however, then began to increase on this regimen, increasing to 147 on 08/21/18 and 258 on 09/11/18 and  373 on 09/25/18. A repeat CT was performed on 09/28/18 which confirmed progression with subtle potential progression along the RIGHT hepatic margin with increase capsular thickening at site of prior hypermetabolic peritoneal metastasis. Two partially calcified peritoneal nodules in the deep pelvis are similar to comparison PET-CT scan and consistent with residual peritoneal carcinoma. No evidence of new peritoneal metastasis in the abdomen pelvis.  She enquired broadly about clinical trials and learnt that she was eligible for the phase I trial  of [id number] +/-pembrolizumab at [facility]. She participated in that study between November 13, 2018 until documented progression on February 05, 2019.  At that time she required paracentesis and thoracentesis both of which confirmed adenocarcinoma with features consistent with high-grade serous carcinoma.  CT imaging confirmed progression of disease with ascites and left pleural effusion. CA 125 on 02/20/19 was significantly elevated at 5,200  On February 20, 2019 she commenced 8th line cytotoxic chemotherapy with a Abraxane and bevacizumab in a 21-day cycle.  She overall tolerated this fairly well though continued to have side effects with nosebleeds and nasal drip from the bevacizumab. [id number] demonstrated progression on 3 cycles of this therapy:  03/13/19: 4,105 04/03/19: 5,218 04/24/19: 6,009 She continued to have accumulation of pleural effusions and ascites. She began having somewhat monthly paracentesis and thoracentesis for symptom relief during this period of treatment.  These were performed at [facility].  CT scan of the abdomen and pelvis performed on April 23, 2019 revealed interval development of moderate volume of peritoneal fluid, signs of peritoneal carcinomatosis.  The largest nodule was in the posterior cul-de-sac measuring 13 x 17 mm which had increased in size by approximately 40%.  There was mesenteric and peritoneal thickening with angulated bowel loops particularly mid to distal small bowel and rectosigmoid colon.  There was mild dilation of small bowel loops which may indicate mild partial small bowel obstruction but no overt obstruction.  There was a moderately large left-sided pleural effusion.  These findings were consistent with progression of disease with no large dominant masses but rather small volume carcinomatosis and caking of peritoneal surfaces.  Abraxane and bevacizumab was discontinued in March, 2021 after 3 cycles due to progression and she was started on  examestane (as her tumor exhibited ER positivity). She tolerated this well though demonstrated persistent fluid accumulation and symptoms.   Interval Hx: Today she presented for discussion of treatment options.  She reported having a paracentesis for approximately 1.5 L of serous ascites performed the day previously on April 30, 2019.  She reported symptom relief with respect to distention after paracentesis but not symptom relief with respect to her abdominal pain.  Ahmiya reported experiencing abdominal cramping and soreness on a somewhat persistent basis.  This is amplified after eating meals and after having a bowel movement.  She feels her bowels rearranging after eating or having bowel movements.  She takes ibuprofen and Tylenol Extra Strength for this.  Reluctantly she has tried Tylenol 3 which improves symptom relief however she is very nervous about constipation or dependence upon opiates.  She denies emesis or nausea.  She reports regular flatus and bowel movements.  With respect to her pleural effusion she has minimal symptoms other than some shortness of breath on extreme exertion.  She has some dry cough when laying flat at night.  She reported that when she met with her oncologist recently she felt some sense of despair as potentially the end of the line was new with respect to chemotherapy options.  She strongly desired continuing attempts at additional therapeutics.  She is also very interested in clinical trials if available though understood that she may not be a good candidate for that given her history of multiple prior regimens.  She has planned meetings with Dr. Theora Gianotti at [facility] to discuss options for on trial or standard of care therapy.   Past Medical History:  Diagnosis Date  . Acute sensory neuropathy 06/26/2015  . Allergy   . Anemia   . Asthma    illness induced asthma  . Blood transfusion without reported diagnosis    at age 59 years old D/T surgery to femur being crushed   . 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 Heart murmur    pt, states had a "working heart murmur"  . History of kidney stones   . Neutropenia, drug-induced ([place]) 03/10/2018  . PONV (postoperative nausea and vomiting)   . Skin cancer    Past Surgical History:  Procedure Laterality Date  . 3 laporscopic proceedures    . ABDOMINAL HYSTERECTOMY     2010  . CHOLECYSTECTOMY     2010  . DILATION AND CURETTAGE OF UTERUS     1997  . IR IMAGING GUIDED PORT INSERTION  03/08/2018  . LAPAROSCOPY N/A 03/27/2015   Procedure: DIAGNOSTIC LAPAROSCOPY ;  Surgeon: [name], MD;  Location: [facility];  Service: Gynecology;  Laterality: N/A;  . LAPAROSCOPY N/A 02/24/2016   Procedure: LAPAROSCOPY DIAGNOSTIC WITH PERITONEAL WASHINGS AND PERITONEAL BIOPSY;  Surgeon: [name], MD;  Location: [facility];  Service: Gynecology;  Laterality: N/A;  . LAPAROTOMY N/A 03/27/2015   Procedure: EXPLORATORY LAPAROTOMY, BILATERAL SALPINGO OOPHORECTOMY, OMENTECTOMY, RADICAL TUMOR DEBULKING;  Surgeon: [name], MD;  Location: [facility];  Service: Gynecology;  Laterality: N/A;  . sinus surgery 1994     Family History  Problem Relation Age of Onset  . Hypertension Father   . Skin cancer Father        nonmelanoma skin cancers in his late 26s  . Non-Hodgkin's lymphoma Maternal Aunt        dx. 37s; smoker  . Stroke Maternal Grandmother   . Other Mother        benign meningioma dx. early-mid-70s; hysterectomy in her late 45s for heavy periods - still has ovaries  . Other Son        one son with pre-cancerous skin findings  . Other Daughter        cysts on ovaries and hx of heavy periods  . Other Sister        hysterectomy for cysts - still has ovaries  . Heart attack Maternal Grandfather   . Heart attack Paternal Grandfather   . Renal cancer Maternal Aunt 60       smoker  . Breast cancer Maternal Aunt 78  . Other Maternal Aunt        dx. benign brain  tumor (meningioma) at 54; 2nd benign brain tumor in her late 104s; hx of radical hysterectomy at age 76  . Brain cancer Other        NOS tumor   Social History   Socioeconomic History  . Marital status: Married    Spouse name: Not on file  . Number of children: 4  . Years of education: 47  . Highest education level: Not on file  Occupational History  . Occupation: Home Office  Tobacco Use  .  Smoking status: Never Smoker  . Smokeless tobacco: Never Used  Substance and Sexual Activity  . Alcohol use: Yes    Comment:  3 glasses wine or beer/week  . Drug use: No  . Sexual activity: Not on file  Other Topics Concern  . Not on file  Social History Narrative   Lives at home w/ husband and children   Right-handed   3 cups caffeine per day   Social Determinants of Health   Financial Resource Strain:   . Difficulty of Paying Living Expenses:   Food Insecurity:   . Worried About Charity fundraiser in the Last Year:   . Arboriculturist in the Last Year:   Transportation Needs:   . Film/video editor (Medical):   [name] Lack of Transportation (Non-Medical):   Physical Activity:   . Days of Exercise per Week:   . Minutes of Exercise per Session:   Stress:   . Feeling of Stress :   Social Connections:   . Frequency of Communication with Friends and Family:   . Frequency of Social Gatherings with Friends and Family:   . Attends Religious Services:   . Active Member of Clubs or Organizations:   . Attends Archivist Meetings:   [name] Marital Status:   Intimate Partner Violence:   . Fear of Current or Ex-Partner:   . Emotionally Abused:   [name] Physically Abused:   . Sexually Abused:    Allergies  Allergen Reactions  . Bee Venom Shortness Of Breath    swelling  . Prochlorperazine Edisylate Anaphylaxis, Anxiety and Palpitations    Anaphylaxis, Anxiety and Palpitations   . Sulfa Antibiotics Shortness Of Breath    Vomit , diarrhea , hives  . Zarxio [Filgrastim]     Current Outpatient Medications on File Prior to Visit  Medication Sig Dispense Refill  . acetaminophen (TYLENOL) 500 MG tablet Take 2 tablets by mouth 3 (three) times daily as needed.    [name] albuterol (VENTOLIN HFA) 108 (90 Base) MCG/ACT inhaler Inhale into the lungs.    [name] exemestane (AROMASIN) 25 MG tablet Take 1 tablet (25 mg total) by mouth daily after breakfast. 90 tablet 4  . fluticasone (FLONASE) 50 MCG/ACT nasal spray Place 1 spray into both nostrils 2 (two) times daily. (Patient taking differently: Place 1 spray into both nostrils as needed. ) 16 g 2  . gabapentin (NEURONTIN) 100 MG capsule 200 mg bid with 300 mg qhs 630 capsule 1  . ibuprofen (ADVIL) 600 MG tablet Take 1 tablet by mouth 4 (four) times daily as needed.    . lidocaine-prilocaine (EMLA) cream APPLY TOPICALLY AS NEEDED. 30 g 0  . loratadine (CLARITIN) 10 MG tablet Take 10 mg by mouth as needed.     . magnesium oxide (MAG-OX) 400 (241.3 Mg) MG tablet Take 1 tablet (400 mg total) by mouth daily. 60 tablet 3  . Multiple Vitamins-Minerals (MULTIVITAMIN ADULT PO) Take 1 tablet by mouth daily.    . Omega 3 1000 MG CAPS Take 1 capsule by mouth daily.    [name] omeprazole (PRILOSEC) 40 MG capsule Take 1 capsule (40 mg total) by mouth daily. 30 capsule 5  . ondansetron (ZOFRAN) 8 MG tablet     . senna-docusate (SENOKOT-S) 8.6-50 MG tablet Take 1-2 tablets by mouth 2 (two) times daily as needed.    . sertraline (ZOLOFT) 50 MG tablet Take 1.5 tablets (75 mg total) by mouth daily. Increase dose to 75 mg daily 45  tablet 3  . traMADol (ULTRAM) 50 MG tablet Take 1 tablet (50 mg total) by mouth every 6 (six) hours as needed. 60 tablet 0  . valACYclovir (VALTREX) 1000 MG tablet Take 1 tablet (1,000 mg total) by mouth daily. 5 tablet 0  . VENTOLIN HFA 108 (90 Base) MCG/ACT inhaler     . vitamin B-12 (CYANOCOBALAMIN) 1000 MCG tablet Take 1 tablet by mouth daily.    . diphenhydrAMINE (BENADRYL) 50 MG capsule 50 mg Diphenhydramine (Benadryl) PO  within 1 hour of the injection 1 capsule 0   Current Facility-Administered Medications on File Prior to Visit  Medication Dose Route Frequency Provider Last Rate Last Admin  . heparin lock flush 100 unit/mL  500 Units Intravenous Once Magrinat, Virgie Dad, MD      . sodium chloride flush (NS) 0.9 % injection 10 mL  10 mL Intravenous PRN Magrinat, Virgie Dad, MD        Review of systems: Constitutional:  She has no weight gain or weight loss. She has no fever or chills. Eyes: No blurred vision Ears, Nose, Mouth, Throat: No dizziness, headaches or changes in hearing. No mouth sores. Cardiovascular: No chest pain, palpitations or edema. Respiratory:  + mild shortness of breath on exertion (eg climbing a flight of stairs) Gastrointestinal: + abdominal pains (colicky) after bowel movements or eating. No nausea/emesis, + flatus. Genitourinary:  She has vague pelvic pain, no pelvic pressure or changes in her urinary function. She has no hematuria, dysuria, or incontinence. She has no irregular vaginal bleeding or vaginal discharge + vaginal dryness Musculoskeletal: Denies muscle weakness or joint pains.  Skin:  She has no skin changes, rashes or itching Neurological:  + peripheral neuropathy Psychiatric:  She denies depression or anxiety. Hematologic/Lymphatic:   No easy bruising or bleeding   Physical Exam: Blood pressure 109/75, pulse 81, temperature 98.2 F (36.8 C), temperature source Temporal, resp. rate 17, height _0  (1.6 m), weight 115 lb 6.4 oz (52.3 kg), SpO2 100 %.  WD in NAD Neck  Supple NROM, without any enlargements.  Lymph Node Survey No cervical supraclavicular or inguinal adenopathy No rash/lesions/breakdown  Psychiatry  Alert and oriented to person, place, and time  Abdomen  Normoactive bowel sounds, abdomen soft, non-tender and thin without evidence of hernia. There is no apparent ascites on today's exam (s/p paracentesis), however she does have some distention  centrally, likely from underlying bowel matting.  Back No CVA tenderness Genito Urinary  Vulva/vagina: Normal external female genitalia.   No lesions. No discharge or bleeding.  Bladder/urethra:  No lesions or masses, well supported bladder  Vagina: smooth, no visible lesions. There is a 2cm firm nodule in the posterior rectovaginal septum that is minimally mobile.   Cervix and uterus surgically absent. Rectal  Rectovaginal septal mass appreciated on rectal exam without transrectal involvement or obstruction.   Extremities  No bilateral cyanosis, clubbing or edema.  45 minutes was spent with the patient in direct face to face communication (including disease status, treatment options, end of life planning/prognostic discussions). This was in addition to additional time spent reviewing notes from referring and co-treating providers, reviewing the CT scan images and reports and reviewing appropriate laboratory evaluations.   Thereasa Solo, MD

## 2019-05-01 NOTE — Patient Instructions (Signed)
Dr Denman George is recommending retrying Doxil with a platinum (eg carboplatin) as the next strategy. She will communicate this with Dr Jana Hakim.

## 2019-05-02 ENCOUNTER — Encounter: Payer: Self-pay | Admitting: Oncology

## 2019-05-02 ENCOUNTER — Telehealth: Payer: Self-pay | Admitting: *Deleted

## 2019-05-02 MED ORDER — DICYCLOMINE HCL 10 MG PO CAPS: 10.0000 mg | ORAL_CAPSULE | Freq: Three times a day (TID) | ORAL | 0 refills | Status: DC

## 2019-05-02 NOTE — Telephone Encounter (Signed)
This RN spoke with pt per her my chart message as well as reviewed with MD.  Per discussion- plan is for pt to start on Prilosec for GERD, bentyl has been sent to pharmacy for abd cramping which may also lessen BMs.  Kathryn Ramsey is reluctant to use anti diarrhea medication at this time due to concern for constipation.  She states diarrhea is often, watery and volume is no consistent. She is not having incontinence.  Presently Kathryn Ramsey is scheduled for visit this Friday with Dr Jana Hakim.

## 2019-05-03 ENCOUNTER — Ambulatory Visit (HOSPITAL_COMMUNITY): Payer: 59

## 2019-05-03 NOTE — Progress Notes (Signed)
[place]  [phone] Fax:([phone]    ID: Kathryn Ramsey DOB: 11/27/1965  MR#: [id number]  BEM#:[id number]  Patient Care Team: Kathryn Stains, MD as PCP - General (Family Medicine) Kathryn Amber, MD as Consulting Physician (Obstetrics and Gynecology) Kathryn Craver, MD as Consulting Physician (Gastroenterology) Kathryn Salina, MD as Consulting Physician (Obstetrics and Gynecology) Kathryn Retort, MD as Consulting Physician (Urology) Kathryn Ends, MD as Referring Physician (Obstetrics and Gynecology) Mettu, Baltazar Apo, MD as Referring Physician (Oncology) OTHER MD: Kathryn Ramsey [phone]) Kathryn Ramsey  NOTE: Patient does not want to be informed of her CA-125.  CHIEF COMPLAINT:  ovarian cancer  CURRENT TREATMENT: Liposomal doxorubicin, carboplatin   INTERVAL HISTORY: Kathryn Ramsey returns today for follow-up of her recurrent ovarian cancer accompanied by her husband Kathryn Ramsey.  Since her last visit, she met with Kathryn. Denman Ramsey to discuss her recent abdomen/pelvis CT scan. They reviewed Kathryn Ramsey's treatment options. Kathryn. Denman Ramsey discussed that October is not a candidate for surgery given her widespread disease.  They discussed the possibility of best supportive care/hospice referral and Kathryn. Denman Ramsey is particularly concerned that Kathryn Ramsey understands if she develops bowel obstruction the only way to go will be a nasogastric tube placement.  She is not a candidate for further surgery.  Kathryn Ramsey also met virtually with Kathryn. Theora Ramsey and they discussed the possibility of studies at [facility] or [facility].  Kathryn Ramsey tells me she is on a waiting list for a possible study at [facility] but she really has no further information on that  She has been taking exemestane for the last 2 weeks.  She has tolerated that well.  She is here today to discuss next steps.   REVIEW OF SYSTEMS: Kathryn Ramsey has abdominal pain.  She is controlling this with Tylenol and Advil.  She  also has oxycodone and tramadol at home.  She had significant diarrhea 2 days ago, then no bowel movement a day ago and then a loose but not diarrheal bowel movement this morning.  There has been no fever bleeding or rash.  She occasionally takes naps but is clearly out of bed most of the day.   HISTORY OF PRESENT ILLNESS: From Kathryn. Serita Ramsey original intake note 03/17/2015:  "Kathryn Ramsey is a very pleasant G4P4 who is seen in consultation at the request of Kathryn Ramsey and Kathryn Ramsey for peritoneal carcinomatosis. The patient has a history of a workup by urology for gross hematuria which included a pelvic examine was suggested for extrinsic mass effect on the bladder on cystoscopy. Cystoscopy took place on in December 2016. The patient denies abdominal pain bloating early satiety or abdominal distention.  On 08/03/2015 at [facility] she underwent a CT scan of the abdomen and pelvis as ordered by Kathryn Ramsey. This revealed a 1.4 cm low attenuation lesion on the posterior right hepatic lobe suggestive of a hemangioma. Status post hysterectomy. No ovarian masses. Moderate ascites. Omental caking seen in the lateral left abdomen and pelvis. Peritoneal nodularity in the left lateral pelvic cul-de-sac. No gross extrinsic compression on the bladder was identified on imaging.  The patient was then seen by her gastroenterologist, Kathryn. Collene Ramsey, who performed a colonoscopy which was unremarkable.  Tumor markers were drawn on 08/04/2015 and these included a CA-125 that was elevated to 957, and a CEA that was normal at 1."  On 03/27/2015 the patient underwent diagnostic laparoscopy, exploratory laparotomy with bilateral salpingo-oophorectomy, omentectomy, and radical tumor debulking with optimal cytoreduction (R0) of what proved to be  a stage IIIc right fallopian tube cancer. She was treated adjuvantly with carboplatin and paclitaxel, as detailed below. Her CA 125 dropped from 1226 on 03/20/2015 to 26.1 by  06/16/2015.  Her subsequent history is as detailed above.   PAST MEDICAL HISTORY: Past Medical History:  Diagnosis Date  . Acute sensory neuropathy 06/26/2015  . Allergy   . Anemia   . Asthma    illness induced asthma  . Blood transfusion without reported diagnosis    at age 75 years old D/T surgery to femur being crushed  . 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 Heart murmur    pt, states had a "working heart murmur"  . History of Ramsey stones   . Neutropenia, drug-induced ([place]) 03/10/2018  . PONV (postoperative nausea and vomiting)   . Skin cancer     PAST SURGICAL HISTORY: Past Surgical History:  Procedure Laterality Date  . 3 laporscopic proceedures    . ABDOMINAL HYSTERECTOMY     2010  . CHOLECYSTECTOMY     2010  . DILATION AND CURETTAGE OF UTERUS     1997  . IR IMAGING GUIDED PORT INSERTION  03/08/2018  . LAPAROSCOPY N/A 03/27/2015   Procedure: DIAGNOSTIC LAPAROSCOPY ;  Surgeon: Kathryn Amber, MD;  Location: [facility];  Service: Gynecology;  Laterality: N/A;  . LAPAROSCOPY N/A 02/24/2016   Procedure: LAPAROSCOPY DIAGNOSTIC WITH PERITONEAL WASHINGS AND PERITONEAL BIOPSY;  Surgeon: Kathryn Amber, MD;  Location: [facility];  Service: Gynecology;  Laterality: N/A;  . LAPAROTOMY N/A 03/27/2015   Procedure: EXPLORATORY LAPAROTOMY, BILATERAL SALPINGO OOPHORECTOMY, OMENTECTOMY, RADICAL TUMOR DEBULKING;  Surgeon: Kathryn Amber, MD;  Location: [facility];  Service: Gynecology;  Laterality: N/A;  . sinus surgery 1994      FAMILY HISTORY Family History  Problem Relation Age of Onset  . Hypertension Father   . Skin cancer Father        nonmelanoma skin cancers in his late 49s  . Non-Hodgkin's lymphoma Maternal Aunt        dx. 31s; smoker  . Stroke Maternal Grandmother   . Other Mother        benign meningioma dx. early-mid-70s; hysterectomy in her late 35s for heavy periods - still has ovaries  . Other Son        one  son with pre-cancerous skin findings  . Other Daughter        cysts on ovaries and hx of heavy periods  . Other Sister        hysterectomy for cysts - still has ovaries  . Heart attack Maternal Grandfather   . Heart attack Paternal Grandfather   . Renal cancer Maternal Aunt 60       smoker  . Breast cancer Maternal Aunt 78  . Other Maternal Aunt        dx. benign brain tumor (meningioma) at 82; 2nd benign brain tumor in her late 51s; hx of radical hysterectomy at age 60  . Brain cancer Other        NOS tumor  The patient's parents are both living, in their late 18s as of January 2018. The patient has one brother, 3 sisters. There is no history of ovarian cancer in the family. One maternal aunt had breast and Ramsey cancer, another had non-Hodgkin's lymphoma.   GYNECOLOGIC HISTORY:  No LMP recorded. Patient has had a hysterectomy. 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 Her husband Kathryn Ramsey is a Engineer, maintenance (IT). Their children are 39, 41, 68 and 34 y/o as of JAN 2018. The oldest works as an Electrical engineer in the [employer] in [place]. The other 3 children live in [place], the youngest currently attending [employer]. The patient has no grandchildren. She attends [employer]    ADVANCED DIRECTIVES: Kathryn Ramsey has completed advanced directives and they are filed.  Her husband Kathryn Ramsey is her healthcare power of attorney.   HEALTH MAINTENANCE: Social History   Tobacco Use  . Smoking status: Never Smoker  . Smokeless tobacco: Never Used  Substance Use Topics  . Alcohol use: Yes    Comment:  3 glasses wine or beer/week  . Drug use: No    Colonoscopy:2016  PAP: s/p hyst  Bone density:   Allergies  Allergen Reactions  . Bee Venom Shortness Of Breath    swelling  . Prochlorperazine Edisylate Anaphylaxis, Anxiety and Palpitations    Anaphylaxis, Anxiety and Palpitations   . Sulfa  Antibiotics Shortness Of Breath    Vomit , diarrhea , hives  . Zarxio [Filgrastim]     Current Outpatient Medications  Medication Sig Dispense Refill  . acetaminophen (TYLENOL) 500 MG tablet Take 2 tablets by mouth 3 (three) times daily as needed.    [name] albuterol (VENTOLIN HFA) 108 (90 Base) MCG/ACT inhaler Inhale into the lungs.    [name] dexamethasone (DECADRON) 4 MG tablet Take 2 tablets by mouth once a day for 3 days after chemo. Take with food. 30 tablet 1  . dicyclomine (BENTYL) 10 MG capsule Take 1 capsule (10 mg total) by mouth 4 (four) times daily -  before meals and at bedtime. 120 capsule 0  . diphenhydrAMINE (BENADRYL) 50 MG capsule 50 mg Diphenhydramine (Benadryl) PO within 1 hour of the injection 1 capsule 0  . exemestane (AROMASIN) 25 MG tablet Take 1 tablet (25 mg total) by mouth daily after breakfast. 90 tablet 4  . fluticasone (FLONASE) 50 MCG/ACT nasal spray Place 1 spray into both nostrils 2 (two) times daily. (Patient taking differently: Place 1 spray into both nostrils as needed. ) 16 g 2  . gabapentin (NEURONTIN) 100 MG capsule 200 mg bid with 300 mg qhs 630 capsule 1  . ibuprofen (ADVIL) 600 MG tablet Take 1 tablet by mouth 4 (four) times daily as needed.    . lidocaine-prilocaine (EMLA) cream APPLY TOPICALLY AS NEEDED. 30 g 0  . loratadine (CLARITIN) 10 MG tablet Take 10 mg by mouth as needed.     [name] LORazepam (ATIVAN) 0.5 MG tablet Take 1 tablet (0.5 mg total) by mouth at bedtime as needed (Nausea or vomiting). 20 tablet 0  . magnesium oxide (MAG-OX) 400 (241.3 Mg) MG tablet Take 1 tablet (400 mg total) by mouth daily. 60 tablet 3  . Multiple Vitamins-Minerals (MULTIVITAMIN ADULT PO) Take 1 tablet by mouth daily.    . Omega 3 1000 MG CAPS Take 1 capsule by mouth daily.    [name] omeprazole (PRILOSEC) 40 MG capsule Take 1 capsule (40 mg total) by mouth daily. 30 capsule 5  . ondansetron (ZOFRAN) 8 MG tablet     . ondansetron (ZOFRAN) 8 MG tablet Take 1 tablet (8 mg total) by  mouth 2 (two) times daily as needed. Start on the third day after chemotherapy. 30 tablet 1  . prochlorperazine (COMPAZINE) 10 MG tablet Take 1  tablet (10 mg total) by mouth every 6 (six) hours as needed (Nausea or vomiting). 30 tablet 1  . senna-docusate (SENOKOT-S) 8.6-50 MG tablet Take 1-2 tablets by mouth 2 (two) times daily as needed.    . sertraline (ZOLOFT) 50 MG tablet Take 1.5 tablets (75 mg total) by mouth daily. Increase dose to 75 mg daily 45 tablet 3  . traMADol (ULTRAM) 50 MG tablet Take 1 tablet (50 mg total) by mouth every 6 (six) hours as needed. 60 tablet 0  . valACYclovir (VALTREX) 1000 MG tablet Take 1 tablet (1,000 mg total) by mouth daily. 5 tablet 0  . VENTOLIN HFA 108 (90 Base) MCG/ACT inhaler     . vitamin B-12 (CYANOCOBALAMIN) 1000 MCG tablet Take 1 tablet by mouth daily.     No current facility-administered medications for this visit.   Facility-Administered Medications Ordered in Other Visits  Medication Dose Route Frequency Provider Last Rate Last Admin  . heparin lock flush 100 unit/mL  500 Units Intravenous Once Magrinat, Virgie Dad, MD      . sodium chloride flush (NS) 0.9 % injection 10 mL  10 mL Intravenous PRN Magrinat, Virgie Dad, MD        OBJECTIVE: Middle-aged white woman in no acute distress  Vitals:   05/04/19 1022  BP: 107/78  Pulse: 73  Resp: 18  Temp: 98.3 F (36.8 C)  SpO2: 100%     Body mass index is 20.39 kg/m.   Filed Weights   05/04/19 1022  Weight: 115 lb 1.6 oz (52.2 kg)    ECOG FS:1 - Symptomatic but completely ambulatory  Sclerae unicteric, EOMs intact Wearing a mask No cervical or supraclavicular adenopathy Lungs no rales or rhonchi, good excursion bilaterally Heart regular rate and rhythm Abd firm, slightly distended, positive bowel sounds MSK no focal spinal tenderness Neuro: nonfocal, well oriented, appropriate affect Breasts: Deferred  LAB RESULTS:  CMP     Component Value Date/Time   NA 137 04/24/2019 0835   NA  140 02/11/2017 0755   K 4.5 04/24/2019 0835   K 4.4 02/11/2017 0755   CL 104 04/24/2019 0835   CO2 26 04/24/2019 0835   CO2 26 02/11/2017 0755   GLUCOSE 91 04/24/2019 0835   GLUCOSE 93 02/11/2017 0755   BUN 15 04/24/2019 0835   BUN 7.9 02/11/2017 0755   CREATININE 0.88 04/24/2019 0835   CREATININE 1.06 (H) 06/19/2018 0931   CREATININE 0.7 02/11/2017 0755   CALCIUM 9.2 04/24/2019 0835   CALCIUM 9.6 02/11/2017 0755   PROT 7.2 04/24/2019 0835   PROT 7.5 02/11/2017 0755   ALBUMIN 3.5 04/24/2019 0835   ALBUMIN 4.4 02/11/2017 0755   AST 17 04/24/2019 0835   AST 22 06/19/2018 0931   AST 27 02/11/2017 0755   ALT 11 04/24/2019 0835   ALT 13 06/19/2018 0931   ALT 24 02/11/2017 0755   ALKPHOS 108 04/24/2019 0835   ALKPHOS 58 02/11/2017 0755   BILITOT 0.2 (L) 04/24/2019 0835   BILITOT 0.2 (L) 06/19/2018 0931   BILITOT 0.43 02/11/2017 0755   GFRNONAA >60 04/24/2019 0835   GFRNONAA 59 (L) 06/19/2018 0931   GFRAA >60 04/24/2019 0835   GFRAA >60 06/19/2018 0931    INo results found for: SPEP, UPEP  Lab Results  Component Value Date   WBC 9.5 04/24/2019   NEUTROABS 7.4 04/24/2019   HGB 10.5 (L) 04/24/2019   HCT 33.6 (L) 04/24/2019   MCV 90.8 04/24/2019   PLT 327 04/24/2019  Chemistry      Component Value Date/Time   NA 137 04/24/2019 0835   NA 140 02/11/2017 0755   K 4.5 04/24/2019 0835   K 4.4 02/11/2017 0755   CL 104 04/24/2019 0835   CO2 26 04/24/2019 0835   CO2 26 02/11/2017 0755   BUN 15 04/24/2019 0835   BUN 7.9 02/11/2017 0755   CREATININE 0.88 04/24/2019 0835   CREATININE 1.06 (H) 06/19/2018 0931   CREATININE 0.7 02/11/2017 0755      Component Value Date/Time   CALCIUM 9.2 04/24/2019 0835   CALCIUM 9.6 02/11/2017 0755   ALKPHOS 108 04/24/2019 0835   ALKPHOS 58 02/11/2017 0755   AST 17 04/24/2019 0835   AST 22 06/19/2018 0931   AST 27 02/11/2017 0755   ALT 11 04/24/2019 0835   ALT 13 06/19/2018 0931   ALT 24 02/11/2017 0755   BILITOT 0.2 (L)  04/24/2019 0835   BILITOT 0.2 (L) 06/19/2018 0931   BILITOT 0.43 02/11/2017 0755      No results found for: LABCA2  No components found for: LABCA125  No results for input(s): INR in the last 168 hours.  Urinalysis    Component Value Date/Time   COLORURINE STRAW (A) 11/22/2017 0940   APPEARANCEUR CLEAR 11/22/2017 0940   LABSPEC 1.002 (L) 11/22/2017 0940   LABSPEC 1.005 10/09/2015 1239   PHURINE 7.0 11/22/2017 0940   GLUCOSEU NEGATIVE 11/22/2017 0940   GLUCOSEU Negative 10/09/2015 1239   HGBUR NEGATIVE 11/22/2017 0940   BILIRUBINUR NEGATIVE 11/22/2017 0940   BILIRUBINUR Negative 10/09/2015 1239   KETONESUR NEGATIVE 11/22/2017 0940   PROTEINUR NEGATIVE 04/03/2019 1050   UROBILINOGEN 0.2 10/09/2015 1239   NITRITE NEGATIVE 11/22/2017 0940   LEUKOCYTESUR NEGATIVE 11/22/2017 0940   LEUKOCYTESUR Negative 10/09/2015 1239    STUDIES: CT Abdomen Pelvis W Contrast  Result Date: 04/23/2019 CLINICAL DATA:  Ovarian cancer staging. EXAM: CT ABDOMEN AND PELVIS WITH CONTRAST TECHNIQUE: Multidetector CT imaging of the abdomen and pelvis was performed using the standard protocol following bolus administration of intravenous contrast. CONTRAST:  67m OMNIPAQUE IOHEXOL 300 MG/ML  SOLN COMPARISON:  09/28/2018 FINDINGS: Lower chest: Signs of basilar atelectasis in the setting of a moderately large left-sided pleural effusion question mild pleural thickening (image 12, series 2) approximately 3 mm greatest thickness. Associated volume loss in the left lower lobe as above. Hepatobiliary: Hemangioma in the posterior right hepatic lobe as before. Scalloped hepatic margins due to moderate volume of peritoneal fluid that has developed since the previous exam. No signs of intraparenchymal hepatic lesion. The portal vein remains patent. Post cholecystectomy. Pancreas: Pancreas is unremarkable without signs of focal lesion, ductal dilation or inflammation. Spleen: The spleen is normal aside from fluid which  surrounds the splenic margin anteriorly. Adrenals/Urinary Tract: Adrenal glands are normal. Kidneys with smooth bilateral renal contours and no suspicious, focal hip renal lesion. Stomach/Bowel: Bowel loops in the abdomen are surrounded by peritoneal fluid. Thickening of mesenteric reflections and tethered appearance of bowel loops particularly the distal sigmoid colon and small bowel loops in the pelvis (image 63, series 2) these loops are held in close apposition with thickening of fascial in mesenteric planes, as much as 4 mm greatest thickness in this area. Clustered bowel loops with respect to mid and distal small bowel in the lower and central abdomen/upper pelvis. Wall there is some mild distension there is no overt evidence of obstruction with contrast passing into the distal small bowel and into the proximal and mid colon. Appendix not visualized.  Vascular/Lymphatic: No signs of significant atherosclerotic change in the abdominal aorta. No signs of aneurysmal dilation. No adenopathy in the retroperitoneum or in the upper abdomen. No signs of adenopathy in the pelvis. Reproductive: Post hysterectomy. Other: Peritoneal thickening throughout. Signs of nodularity as well. (Image 27, series 2) 4 mm nodule in the left upper quadrant adjacent to spleen and colon. (Image 70, series 2) 13 x 17 mm nodule in the cul-de-sac previously approximately 9 x 14 mm with some fullness of the right vaginal apex as well. Musculoskeletal: No signs of acute bone finding or evidence of destructive bone process. IMPRESSION: 1. Interval development of moderate volume peritoneal fluid. 2. Signs of peritoneal carcinomatosis. The largest nodule in the cul-de-sac measures 13 x 17 mm, previously 9 x 14 mm. 3. Mesenteric and peritoneal thickening with angulated bowel loops particularly mid to distal small bowel and rectosigmoid colon. Mild dilation of small bowel loops may indicate mild partial small bowel obstruction. There are no overt  signs of obstruction currently. 4. Signs of basilar atelectasis in the setting of a moderately large left-sided pleural effusion. Moderate pleural effusion may be indicative of malignant pleural fluid based on appearance and other findings on the exam. These results will be called to the ordering clinician or representative by the Radiologist Assistant, and communication documented in the PACS or zVision Dashboard. Electronically Signed   By: Zetta Bills M.D.   On: 04/23/2019 08:42     ELIGIBLE FOR AVAILABLE RESEARCH PROTOCOL:  no  ASSESSMENT: 54 y.o. BRCA negative [place] woman status post radical tumor debulking with optimal cytoreduction (R0) 03/27/2015 for a stage IIIC, high-grade right fallopian tube carcinoma  (a) baseline CA-125 was 1226.  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)  (c) tumor is PD-L1 negative (02/24/2016)  (d) tumor is strongly estrogen receptor positive, progesterone receptor negative (02/24/2016)  (1) adjuvant chemotherapy consisted of carboplatin and paclitaxel for 6 doses, begun 04/14/2015, completed 08/11/2015  (a) paclitaxel was omitted from cycle 5 and dose reduced on cycle 6 because of neuropathy   (b) a "make up" dose of paclitaxel was given 08/25/2015  (c) last carboplatin dose was 08/11/2015  (d) CA-125 normalized by 06/16/2015   (2) FIRST RECURRENCE: January 2018  (a) CA-125 rise beginning November 2017 led to CT scans and PET scans December 2017, all negative  (b) exploratory laparotomy 02/24/2016 showed pathologically confirmed recurrence, with miliary disease involving all examined surfaces  (c) port-site metastases noted 03/08/2016  (d) the January 2018 tumor sample was tested for the estrogen receptor and he was 80 percent positive with strong staining, progesterone receptor  negative  (3) carboplatin/liposomal doxorubicin started 03/05/2016, repeated every three weeks x4, completed 06/04/2016.  (a) cycle 3 delayed one week because of neutropenia; OnPro added  (b) CA 125 normalized after cycle 3  (4) RUCAPARIB maintenance started 07/02/2016  (a) restaging 10/01/2016: Normal CA 125, negative CT of the abdomen and pelvis  (b) labs 11/24/2016 shows a rise in her CA 125-43.4, with continuing rise thereafter  (c) rucaparib discontinued October 2018 with rising CA 125  (5) anastrozole started December 23, 2016-stopped after a couple of weeks with poor tolerance  SECOND RECURRENCE: (6) Gemcitabine/Bevacizumab started on 02/04/2017  (a) gemcitabine omitted cycle 4 because of intercurrent infection  (b) gemcitabine resumed with cycle 5, with intervening rise in  Ca 125  (c) repeat CT scan  of the abdomen and pelvis on 05/26/2017 does not confirm obvious disease progression  (d) cycle 5 of gemcitabine/bevacizumab delayed 1 week   (e) gemcitabine/ bevacizumab discontinued after 6 cycles, with rising CA 125 (last dose 06/24/2017)  (7) foundation one testing did not show a high mutation burden and the microsatellite status was stable.  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  These were not immediately actionable  (8) Abraxane started 07/12/2017, given weekly or weeks 1 and 2 of each 3 weeks cycle, last dose 11/15/2017 (5 cycles)  (a) cycle 2 started 08/09/2017, will be day 1 day 8 only  (b) cycle 3 scheduled to start 09/13/2017, day 1 day 8 at patient request  (c) CT of the abdomen and pelvis 11/28/2017 shows no measurable disease  (d) PET scan 01/25/2018 and MRI of the pelvis 02/03/2018 show no measurable disease  (e) patient has symptomatic ascites and a rapidly rising Ca1 25  (9) started cisplatin/gemcitabine days 1 and 8 of each 21-day cycle on 02/18/2018  (a) switched to carboplatin/ gemcitabine 07/17/2018 due to neuropathy  (b) carbo/gemzar changed to Q14  days 07/24/2018 due to neutropenia  (c) considered niraparib/Zejula 100 mg, 2 tabs daily at the completion of chemotherapy  (10) associated problems  (a) anemia due to chemotherapy: On Aranesp  (b) 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 10/25/18 Consenting for AMV-564 clinical trial 21. 11/13/18 AMV-564 initiation 75 mcg dose 22. 10/8 Dropped down to 50 mcg dose (I think that date is correct) 23. 10/19-21/20 Tx held due to Neutropenia 24. 12/06/18 Given Neulasta 25. 12/07/18 AMV-564 resumed 26. 01/03/19 CT C/A/P 10% RECIST progression 27. 01/15/19 Increased dose to 75 mcg/daily 28. 01/17/19 Thoracentesis with cytology c/w Mullerian primary  29. Paracentesis 02/05/2019: cytology c/w adenocarcinoma  (12) schedule of thoracenteses:  (a) left 01/17/2019 [facility])  (b) left 02/06/2019 [facility])  (c)   (13) schedule of paracenteses:  (a) 01/30/2019 ([place])  (b) 02/06/2019 ([place])  (c) 02/18/2018 ([place])  (d) 04/17/2019 ([place])  (14) abraxane started 02/20/2019, repeated days 1 and 8 of every 21 day cycle  (a) bevacizumab/Avastin started 02/20/2019 and repeated every 21 days  (b) She cannot tolerate Zarxio due to side effects consisting of low grade fever, body aches, malaise, fatigue, and itching, whereas she has been able to tolerate Granix though  (c) bevacizumab and Abraxane discontinued after 04/10/2019 dose with evidence of progression  (15) exemestane started 04/24/2019, discontinued after 2 weeks as patient opted for chemotherapy  (16) to start liposomal doxorubicin/carboplatin 05/10/2019, repeated every 28 days  (a) echocardiogram  (b) patient is interested in Startup if available through the "right to try" program   PLAN: Kathryn Ramsey's cancer did not respond to Abraxane and bevacizumab and we are discontinuing that.  She took exemestane for 2  weeks with good tolerance.  However we are putting that aside for now in favor of trying something more aggressive.  Kathryn. Denman Ramsey mentioned multiple drugs to her including navelbine topotecan and cyclophosphamide.  Kathryn Ramsey had a good response sometime ago to liposomal doxorubicin and carboplatin and that certainly is not unreasonable.  Today we discussed the possible toxicities side effects and complications of these agents and she is quite aware of them.  At this point her choice is to proceed with treatment.  We discussed how much Kathryn Ramsey family aside from her husband Kathryn Ramsey is aware of her situation.  Kathryn.  Denman Ramsey emphasized that if obstruction occurs supportive care is all we would be able to offer.  Today we discussed prognosis in terms of likely events in the next month or two understanding that doctors really do not know the future any more than anyone else.  I encouraged Kathryn Ramsey to discuss her situation very frankly with her children as right now she is in very good shape physically and mentally and it is not clear how long that situation will last given the likely course of this disease from this point.  She is interested in obtaining AZD 1775.  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  She tells me there is a program called "right to try", which may facilitate this.  I have asked our pharmacy to give [name] a hand in this.  I have copied the abstract of a phase 2 study involving this drug at the bottom of this note.  I have set [facility] up for an echo next week and chemotherapy to start 05/10/2019.  I will see her the following week to make sure she tolerated it well.  Total encounter time 45 minutes.Sarajane Jews C. Magrinat, MD 05/04/19 2:51 PM Medical Oncology and Hematology [facility] [place], [place] [address] Tel. [phone]    Fax. [phone]   I, Wilburn Mylar, am acting as scribe for Kathryn. Virgie Dad. Magrinat.  I, Lurline Del MD, have reviewed the above documentation for accuracy and completeness, and I agree with the above.    *Total Encounter Time as defined by the Centers for Medicare and Medicaid Services includes, in addition to the face-to-face time of a patient visit (documented in the note above) non-face-to-face time: obtaining and reviewing outside history, ordering and reviewing medications, tests or procedures, care coordination (communications with other health care professionals or caregivers) and documentation in the medical record.  Cancer Refractory or Resistant to First-Line Therapy Within 3 Months Theodoro [place] 1, Halford Decamp 1, Gabe S Sonke 1, [place] 1, Efraim H [place] 1, [place] 1, Dick Pluim 1, Beverlee Nims 1, Shelonitda Rose 1, Bunnie Philips 1, Tomoko [place] 1, Jos H [place] 1, Jan H M Schellens 1 Affiliations expand PMID: [id number] DOI: 10.1200/JCO.[email or web address] Abstract Purpose [id number] is a first-in-class, potent, and selective inhibitor of WEE1 with proof of chemopotentiation in p53-deficient tumors in preclinical models. In a phase I study, the maximum tolerated dose of AZD1775 in combination with carboplatin demonstrated target engagement. 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 Patients and Methods Patients were treated with carboplatin (area under the curve, 5 mg/mL?min) combined with AZD1775 225 mg orally twice daily over 2.5 days every 21-day cycle until disease progression. Results AZD1775 plus carboplatin demonstrated manageable toxicity; fatigue (87%),  nausea (78%), thrombocytopenia (70%), diarrhea (70%), and vomiting (48%) were the most common adverse events. The most frequent grade 3 or 4 adverse events were thrombocytopenia (48%) and neutropenia (37%). Of 24 patients enrolled, 21 patients were evaluable for efficacy end points. The overall response rate was 43% (95% CI, 22% to 66%), including one patient (5%) with a prolonged complete response.  Median progression-free and overall survival times were 5.3 months (95% CI, 2.3 to 9.0 months) and 12.6 months (95% CI, 4.9 to 19.7), respectively, with two patients with ongoing response for more than 31 and 42 months at data cutoff. Conclusion To our knowledge, this is the first report providing clinical proof that AZD1775 enhances carboplatin efficacy in TP53-mutated tumors. The encouraging antitumor activity observed in patients with TP53-mutated ovarian cancer who were refractory or resistant (< 3 months) to first-line therapy warrants further development.  Trial registration: ClinicalTrials.gov [id number].

## 2019-05-04 ENCOUNTER — Other Ambulatory Visit: Payer: Self-pay

## 2019-05-04 ENCOUNTER — Inpatient Hospital Stay (HOSPITAL_BASED_OUTPATIENT_CLINIC_OR_DEPARTMENT_OTHER): Payer: 59 | Admitting: Oncology

## 2019-05-04 VITALS — BP 107/78 | HR 73 | Temp 98.3°F | Resp 18 | Ht 63.0 in | Wt 115.1 lb

## 2019-05-04 DIAGNOSIS — C801 Malignant (primary) neoplasm, unspecified: Secondary | ICD-10-CM

## 2019-05-04 DIAGNOSIS — C5701 Malignant neoplasm of right fallopian tube: Secondary | ICD-10-CM | POA: Diagnosis not present

## 2019-05-04 DIAGNOSIS — C786 Secondary malignant neoplasm of retroperitoneum and peritoneum: Secondary | ICD-10-CM

## 2019-05-04 DIAGNOSIS — C762 Malignant neoplasm of abdomen: Secondary | ICD-10-CM

## 2019-05-04 DIAGNOSIS — G620 Drug-induced polyneuropathy: Secondary | ICD-10-CM

## 2019-05-04 DIAGNOSIS — Z7189 Other specified counseling: Secondary | ICD-10-CM

## 2019-05-04 DIAGNOSIS — Z5111 Encounter for antineoplastic chemotherapy: Secondary | ICD-10-CM | POA: Diagnosis not present

## 2019-05-04 DIAGNOSIS — T451X5A Adverse effect of antineoplastic and immunosuppressive drugs, initial encounter: Secondary | ICD-10-CM

## 2019-05-04 DIAGNOSIS — D701 Agranulocytosis secondary to cancer chemotherapy: Secondary | ICD-10-CM

## 2019-05-04 MED ORDER — ONDANSETRON HCL 8 MG PO TABS: 8.0000 mg | ORAL_TABLET | Freq: Two times a day (BID) | ORAL | 1 refills | Status: DC | PRN

## 2019-05-04 MED ORDER — DEXAMETHASONE 4 MG PO TABS: ORAL_TABLET | ORAL | 1 refills | Status: DC

## 2019-05-04 MED ORDER — LORAZEPAM 0.5 MG PO TABS: 0.5000 mg | ORAL_TABLET | Freq: Every evening | ORAL | 0 refills | Status: DC | PRN

## 2019-05-04 MED ORDER — PROCHLORPERAZINE MALEATE 10 MG PO TABS: 10.0000 mg | ORAL_TABLET | Freq: Four times a day (QID) | ORAL | 1 refills | Status: DC | PRN

## 2019-05-04 NOTE — Progress Notes (Signed)
DISCONTINUE OFF PATHWAY REGIMEN - Ovarian   [id number]:Nab-Paclitaxel (Abraxane) 100 mg/m2 D1,8,15 + Bevacizumab 10 mg/kg D1,15 q28 days:   A cycle is every 28 days (3 weeks on and 1 week off):     Nab-paclitaxel (protein bound)      Bevacizumab-xxxx   **Always confirm dose/schedule in your pharmacy ordering system**  REASON: Disease Progression PRIOR TREATMENT: Off Pathway: Nab-Paclitaxel (Abraxane) 100 mg/m2 D1,8,15 + Bevacizumab 10 mg/kg D1,15 q28 days TREATMENT RESPONSE: Progressive Disease (PD)  START OFF PATHWAY REGIMEN - Ovarian   [id number]:Liposomal Doxorubicin (Doxil) 30 mg/m2 + Carboplatin AUC=5 q28 Days:   A cycle is every 28 days:     Carboplatin      Liposomal doxorubicin   **Always confirm dose/schedule in your pharmacy ordering system**  Patient Characteristics: Recurrent or Progressive Disease, Fourth Line and Beyond, BRCA Mutation Absent Therapeutic Status: Recurrent or Progressive Disease BRCA Mutation Status: Absent Line of Therapy: Fourth Line and Beyond  Intent of Therapy: Non-Curative / Palliative Intent, Discussed with Patient

## 2019-05-07 ENCOUNTER — Telehealth: Payer: Self-pay | Admitting: Oncology

## 2019-05-07 ENCOUNTER — Encounter: Payer: Self-pay | Admitting: Oncology

## 2019-05-07 NOTE — Telephone Encounter (Signed)
Scheduled appt per 3/19 los. Pt confirmed appt date and time.

## 2019-05-08 ENCOUNTER — Telehealth: Payer: Self-pay | Admitting: Pharmacist

## 2019-05-08 NOTE — Progress Notes (Signed)
Pharmacist Chemotherapy Monitoring - Initial Assessment    Anticipated start date: 05/10/19   Regimen:  . Are orders appropriate based on the patient's diagnosis, regimen, and cycle? Yes . Does the plan date match the patient's scheduled date? Yes . Is the sequencing of drugs appropriate? Yes . Are the premedications appropriate for the patient's regimen? Yes . Prior Authorization for treatment is: Pending o If applicable, is the correct biosimilar selected based on the patient's insurance? Yes (will recommend Neulasta Onpro due to needing w/ regimen in the past/preferred biosimilar for pegfilgrastim)  Organ Function and Labs: [name] Are dose adjustments needed based on the patient's renal function, hepatic function, or hematologic function? No . Are appropriate labs ordered prior to the start of patient's treatment? Yes . Other organ system assessment, if indicated: doxorubicin liposomal: Echo/ MUGA - follow up results  Dose Assessment: . Are the drug doses appropriate? Yes . Are the following correct: o Drug concentrations Yes o IV fluid compatible with drug Yes o Administration routes Yes o Timing of therapy Yes . If applicable, does the patient have documented access for treatment and/or plans for port-a-cath placement? yes . If applicable, have lifetime cumulative doses been properly documented and assessed? Yes - antihistamines added for [employer]. Lifetime Dose Tracking  . Carboplatin: 6,110 mg = 0.01 % of the maximum lifetime dose of 999,999,999 mg  . Liposomal Doxorubicin: 118.329 mg/m2 (184 mg) = 26.3 % of the maximum lifetime dose of 450 mg/m2  o   Toxicity Monitoring/Prevention: . The patient has the following take home antiemetics prescribed: Ondansetron, Prochlorperazine, Dexamethasone and Lorazepam . The patient has the following take home medications prescribed: N/A . Medication allergies and previous infusion related reactions, if applicable, have been reviewed and  addressed. Yes . The patient's current medication list has been assessed for drug-drug interactions with their chemotherapy regimen. no significant drug-drug interactions were identified on review.  Order Review: . Are the treatment plan orders signed? Yes . Is the patient scheduled to see a provider prior to their treatment? Yes  I verify that I have reviewed each item in the above checklist and answered each question accordingly.  Norwood Levo [facility] 05/08/2019 12:17 PM

## 2019-05-09 ENCOUNTER — Ambulatory Visit (HOSPITAL_COMMUNITY)
Admission: RE | Admit: 2019-05-09 | Discharge: 2019-05-09 | Disposition: A | Discharge status: Home / Self Care | Payer: 59 | Source: Ambulatory Visit | Attending: Oncology | Admitting: Oncology

## 2019-05-09 DIAGNOSIS — Z01810 Encounter for preprocedural cardiovascular examination: Secondary | ICD-10-CM | POA: Insufficient documentation

## 2019-05-09 DIAGNOSIS — T451X5A Adverse effect of antineoplastic and immunosuppressive drugs, initial encounter: Secondary | ICD-10-CM | POA: Diagnosis not present

## 2019-05-09 DIAGNOSIS — Z7189 Other specified counseling: Secondary | ICD-10-CM | POA: Diagnosis not present

## 2019-05-09 DIAGNOSIS — C786 Secondary malignant neoplasm of retroperitoneum and peritoneum: Secondary | ICD-10-CM

## 2019-05-09 DIAGNOSIS — C762 Malignant neoplasm of abdomen: Secondary | ICD-10-CM

## 2019-05-09 DIAGNOSIS — C801 Malignant (primary) neoplasm, unspecified: Secondary | ICD-10-CM

## 2019-05-09 DIAGNOSIS — C5701 Malignant neoplasm of right fallopian tube: Secondary | ICD-10-CM | POA: Insufficient documentation

## 2019-05-09 DIAGNOSIS — Z0189 Encounter for other specified special examinations: Secondary | ICD-10-CM | POA: Diagnosis not present

## 2019-05-09 DIAGNOSIS — D701 Agranulocytosis secondary to cancer chemotherapy: Secondary | ICD-10-CM | POA: Diagnosis not present

## 2019-05-09 DIAGNOSIS — G620 Drug-induced polyneuropathy: Secondary | ICD-10-CM | POA: Diagnosis not present

## 2019-05-09 DIAGNOSIS — J90 Pleural effusion, not elsewhere classified: Secondary | ICD-10-CM | POA: Insufficient documentation

## 2019-05-09 NOTE — Progress Notes (Signed)
  Echocardiogram 2D Echocardiogram has been performed.  Kathryn Ramsey 05/09/2019, 9:51 AM

## 2019-05-09 NOTE — Progress Notes (Signed)
[place]  [phone]) [phone] [phone]    ID: Kathryn Ramsey DOB: 01/11/1966  MR#: [id number]  OHY#:[id number]  Patient Care Team: Harlan Stains, MD as PCP - General (Family Medicine) [name], MD as Consulting Physician (Obstetrics and Gynecology) Juanita Craver, MD as Consulting Physician (Gastroenterology) Servando Salina, MD as Consulting Physician (Obstetrics and Gynecology) Nickie Retort, MD as Consulting Physician (Urology) Gillis Ends, MD as Referring Physician (Obstetrics and Gynecology) Mettu, Baltazar Apo, MD as Referring Physician (Oncology) OTHER MD: Festus Aloe [phone]) Floyde Parkins  NOTE: Patient does not want to be informed of her CA-125.  CHIEF COMPLAINT:  ovarian cancer  CURRENT TREATMENT: Liposomal doxorubicin, carboplatin   INTERVAL HISTORY: Kathryn Ramsey returns today for follow-up of her recurrent ovarian cancer unaccompanied today.  She is here to start liposomal doxorubicin today, along with Carboplatin.  She underwent an echocardiogram yesterday that noted a well preserved EF of 55-60%.  Kathryn Ramsey underwent a paracentesis two or three weeks ago and had a liter or two of fluid drained off.  She has a pleural effusion that is drained intermittently.   Kathryn Ramsey notes that she is on a clinical trial wait list at [facility].  Our staff are also looking into [id number] study to see if we can gain compassionate use for it.  We have not been able to get in touch with a point person on that study yet.     REVIEW OF SYSTEMS: Kathryn Ramsey notes that her abdomen remains swollen.  It is improved since paracentesis, however she has areas of thickening where the cancer is, and she is going to see pelvic rehab next week to work with them on her abdomen.  She notes that the past couple of appointments, Dr. Jana Hakim has been very honest about her cancer and her prognosis.  She notes that she is not in denial about her situation, but that  also she wants to keep trying treatments that may help because she still feels relatively well and strong.  She notes that she has advanced directives in place and her kids know the seriousness of the situation.  She would prefer that her upcoming appointments remain more upbeat, now that everything about prognosis has been discussed in detail.    Kathryn Ramsey notes that she has GERD that is significant.  She is taking Omeprazole daily and notes that the GERD remains persistent, and sometimes she can feel the acid in her throat and this will cause vomiting.  She wants to know what else she might be able to do for this.  Kathryn Ramsey is having regular bowel movements.    Kathryn Ramsey denies any fever, chills, chest pain, cough, shortness of breath, bladder changes, nausea, headaches, vision issues, or any other concerns.  A detailed ROS Was otherwise non contributory.    HISTORY OF PRESENT ILLNESS: From Dr. Serita Grit original intake note 03/17/2015:  "Kathryn Ramsey is a very pleasant G4P4 who is seen in consultation at the request of Dr Collene Mares and Dr Garwin Brothers for peritoneal carcinomatosis. The patient has a history of a workup by urology for gross hematuria which included a pelvic examine was suggested for extrinsic mass effect on the bladder on cystoscopy. Cystoscopy took place on in December 2016. The patient denies abdominal pain bloating early satiety or abdominal distention.  On 08/03/2015 at [facility] she underwent a CT scan of the abdomen and pelvis as ordered by Dr Baruch Gouty. This revealed a 1.4 cm low attenuation lesion on the posterior right hepatic lobe suggestive  of a hemangioma. Status post hysterectomy. No ovarian masses. Moderate ascites. Omental caking seen in the lateral left abdomen and pelvis. Peritoneal nodularity in the left lateral pelvic cul-de-sac. No gross extrinsic compression on the bladder was identified on imaging.  The patient was then seen by her gastroenterologist, Dr. Collene Mares, who performed  a colonoscopy which was unremarkable.  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 She was treated adjuvantly with carboplatin and paclitaxel, as detailed below. Her CA 125 dropped from 1226 on 03/20/2015 to 26.1 by 06/16/2015.  Her subsequent history is as detailed above.   PAST MEDICAL HISTORY: Past Medical History:  Diagnosis Date  . Acute sensory neuropathy 06/26/2015  . Allergy   . Anemia   . Asthma    illness induced asthma  . Blood transfusion without reported diagnosis    at age 54 years old D/T surgery to femur being crushed  . 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 Heart murmur    pt, states had a "working heart murmur"  . History of kidney stones   . Neutropenia, drug-induced (Aten) 03/10/2018  . PONV (postoperative nausea and vomiting)   . Skin cancer     PAST SURGICAL HISTORY: Past Surgical History:  Procedure Laterality Date  . 3 laporscopic proceedures    . ABDOMINAL HYSTERECTOMY     2010  . CHOLECYSTECTOMY     2010  . DILATION AND CURETTAGE OF UTERUS     1997  . IR IMAGING GUIDED PORT INSERTION  03/08/2018  . LAPAROSCOPY N/A 03/27/2015   Procedure: DIAGNOSTIC LAPAROSCOPY ;  Surgeon: [name], MD;  Location: [facility];  Service: Gynecology;  Laterality: N/A;  . LAPAROSCOPY N/A 02/24/2016   Procedure: LAPAROSCOPY DIAGNOSTIC WITH PERITONEAL WASHINGS AND PERITONEAL BIOPSY;  Surgeon: [name], MD;  Location: [facility];  Service: Gynecology;  Laterality: N/A;  . LAPAROTOMY N/A 03/27/2015   Procedure: EXPLORATORY LAPAROTOMY, BILATERAL SALPINGO OOPHORECTOMY,  OMENTECTOMY, RADICAL TUMOR DEBULKING;  Surgeon: [name], MD;  Location: [facility];  Service: Gynecology;  Laterality: N/A;  . sinus surgery 1994      FAMILY HISTORY Family History  Problem Relation Age of Onset  . Hypertension Father   . Skin cancer Father        nonmelanoma skin cancers in his late 29s  . Non-Hodgkin's lymphoma Maternal Aunt        dx. 35s; smoker  . Stroke Maternal Grandmother   . Other Mother        benign meningioma dx. early-mid-70s; hysterectomy in her late 77s for heavy periods - still has ovaries  . Other Son        one son with pre-cancerous skin findings  . Other Daughter        cysts on ovaries and hx of heavy periods  . Other Sister        hysterectomy for cysts - still has ovaries  . Heart attack Maternal Grandfather   . Heart attack Paternal Grandfather   . Renal cancer Maternal Aunt 60       smoker  . Breast cancer Maternal Aunt 78  .  Other Maternal Aunt        dx. benign brain tumor (meningioma) at 57; 2nd benign brain tumor in her late 85s; hx of radical hysterectomy at age 18  . Brain cancer Other        NOS tumor  The patient's parents are both living, in their late 15s as of January 2018. The patient has one brother, 3 sisters. There is no history of ovarian cancer in the family. One maternal aunt had breast and kidney cancer, another had non-Hodgkin's lymphoma.   GYNECOLOGIC HISTORY:  No LMP recorded. Patient has had a hysterectomy. 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 Her husband [name] is a Engineer, maintenance (IT). Their children are 55, 108, 72 and 30 y/o as of JAN 2018. The oldest works as an Electrical engineer in the [employer] in [place]. The other 3 children live in [place], the youngest currently attending [employer]. The patient has no grandchildren. She attends [employer]    ADVANCED DIRECTIVES: Kathi has completed advanced  directives and they are filed.  Her husband [name] is her healthcare power of attorney.   HEALTH MAINTENANCE: Social History   Tobacco Use  . Smoking status: Never Smoker  . Smokeless tobacco: Never Used  Substance Use Topics  . Alcohol use: Yes    Comment:  3 glasses wine or beer/week  . Drug use: No    Colonoscopy:2016  PAP: s/p hyst  Bone density:   Allergies  Allergen Reactions  . Bee Venom Shortness Of Breath    swelling  . Prochlorperazine Edisylate Anaphylaxis, Anxiety and Palpitations    Anaphylaxis, Anxiety and Palpitations   . Sulfa Antibiotics Shortness Of Breath    Vomit , diarrhea , hives  . Zarxio [Filgrastim]     Current Outpatient Medications  Medication Sig Dispense Refill  . acetaminophen (TYLENOL) 500 MG tablet Take 2 tablets by mouth 3 (three) times daily as needed.    [name] albuterol (VENTOLIN HFA) 108 (90 Base) MCG/ACT inhaler Inhale into the lungs.    [name] dexamethasone (DECADRON) 4 MG tablet Take 2 tablets by mouth once a day for 3 days after chemo. Take with food. 30 tablet 1  . dicyclomine (BENTYL) 10 MG capsule Take 1 capsule (10 mg total) by mouth 4 (four) times daily -  before meals and at bedtime. 120 capsule 0  . exemestane (AROMASIN) 25 MG tablet Take 1 tablet (25 mg total) by mouth daily after breakfast. 90 tablet 4  . fluticasone (FLONASE) 50 MCG/ACT nasal spray Place 1 spray into both nostrils 2 (two) times daily. (Patient taking differently: Place 1 spray into both nostrils as needed. ) 16 g 2  . gabapentin (NEURONTIN) 100 MG capsule 200 mg bid with 300 mg qhs 630 capsule 1  . ibuprofen (ADVIL) 600 MG tablet Take 1 tablet by mouth 4 (four) times daily as needed.    . lidocaine-prilocaine (EMLA) cream APPLY TOPICALLY AS NEEDED. 30 g 0  . loratadine (CLARITIN) 10 MG tablet Take 10 mg by mouth as needed.     [name] LORazepam (ATIVAN) 0.5 MG tablet Take 1 tablet (0.5 mg total) by mouth at bedtime as needed (Nausea or vomiting). 20 tablet 0  . magnesium oxide  (MAG-OX) 400 (241.3 Mg) MG tablet Take 1 tablet (400 mg total) by mouth daily. 60 tablet 3  . Multiple Vitamins-Minerals (MULTIVITAMIN ADULT PO) Take 1 tablet  by mouth daily.    . Omega 3 1000 MG CAPS Take 1 capsule by mouth daily.    [name] omeprazole (PRILOSEC) 40 MG capsule Take 1 capsule (40 mg total) by mouth daily. 30 capsule 5  . ondansetron (ZOFRAN) 8 MG tablet     . ondansetron (ZOFRAN) 8 MG tablet Take 1 tablet (8 mg total) by mouth 2 (two) times daily as needed. Start on the third day after chemotherapy. 30 tablet 1  . prochlorperazine (COMPAZINE) 10 MG tablet Take 1 tablet (10 mg total) by mouth every 6 (six) hours as needed (Nausea or vomiting). 30 tablet 1  . senna-docusate (SENOKOT-S) 8.6-50 MG tablet Take 1-2 tablets by mouth 2 (two) times daily as needed.    . sertraline (ZOLOFT) 50 MG tablet Take 1.5 tablets (75 mg total) by mouth daily. Increase dose to 75 mg daily 45 tablet 3  . traMADol (ULTRAM) 50 MG tablet Take 1 tablet (50 mg total) by mouth every 6 (six) hours as needed. 60 tablet 0  . valACYclovir (VALTREX) 1000 MG tablet Take 1 tablet (1,000 mg total) by mouth daily. 5 tablet 0  . VENTOLIN HFA 108 (90 Base) MCG/ACT inhaler     . vitamin B-12 (CYANOCOBALAMIN) 1000 MCG tablet Take 1 tablet by mouth daily.    . diphenhydrAMINE (BENADRYL) 50 MG capsule 50 mg Diphenhydramine (Benadryl) PO within 1 hour of the injection 1 capsule 0   No current facility-administered medications for this visit.   Facility-Administered Medications Ordered in Other Visits  Medication Dose Route Frequency Provider Last Rate Last Admin  . CARBOplatin (PARAPLATIN) 380 mg in sodium chloride 0.9 % 250 mL chemo infusion  380 mg Intravenous Once Magrinat, Virgie Dad, MD      . DOXOrubicin HCL LIPOSOMAL (DOXIL) 46 mg in dextrose 5 % 250 mL chemo infusion  30 mg/m2 (Treatment Plan Recorded) Intravenous Once Magrinat, Virgie Dad, MD 273 mL/hr at 05/10/19 1348 46 mg at 05/10/19 1348  . heparin lock flush 100  unit/mL  500 Units Intravenous Once Magrinat, Virgie Dad, MD      . pegfilgrastim (NEULASTA ONPRO KIT) injection 6 mg  6 mg Subcutaneous Once Nora Sabey, Charlestine Massed, NP      . sodium chloride flush (NS) 0.9 % injection 10 mL  10 mL Intravenous PRN Magrinat, Virgie Dad, MD        OBJECTIVE:  Vitals:   05/10/19 0958  BP: 106/76  Pulse: 79  Resp: 18  Temp: 98 F (36.7 C)     Body mass index is 20.05 kg/m.   Filed Weights   05/10/19 0958  Weight: 113 lb 3.2 oz (51.3 kg)    ECOG FS:1 - Symptomatic but completely ambulatory GENERAL: Patient is a well appearing female in no acute distress HEENT:  Sclerae anicteric. Neck is supple.  NODES:  No cervical, supraclavicular, or axillary lymphadenopathy palpated.  BREAST EXAM:  Deferred. LUNGS:  Clear to auscultation bilaterally.  No wheezes or rhonchi. HEART:  Regular rate and rhythm. No murmur appreciated. ABDOMEN:  Soft, mildly distended with thickening noted in left upper quadrant and in upper pelvis  Positive, normoactive bowel sounds. No organomegaly palpated. MSK:  No focal spinal tenderness to palpation.  EXTREMITIES:  No peripheral edema.   SKIN:  Clear with no obvious rashes or skin changes. No nail dyscrasia. NEURO:  Nonfocal. Well oriented.  Appropriate affect.    LAB RESULTS:  CMP     Component Value Date/Time   NA 140 05/10/2019 0945  NA 140 02/11/2017 0755   K 4.3 05/10/2019 0945   K 4.4 02/11/2017 0755   CL 102 05/10/2019 0945   CO2 26 05/10/2019 0945   CO2 26 02/11/2017 0755   GLUCOSE 102 (H) 05/10/2019 0945   GLUCOSE 93 02/11/2017 0755   BUN 13 05/10/2019 0945   BUN 7.9 02/11/2017 0755   CREATININE 1.04 (H) 05/10/2019 0945   CREATININE 1.06 (H) 06/19/2018 0931   CREATININE 0.7 02/11/2017 0755   CALCIUM 9.2 05/10/2019 0945   CALCIUM 9.6 02/11/2017 0755   PROT 7.3 05/10/2019 0945   PROT 7.5 02/11/2017 0755   ALBUMIN 3.2 (L) 05/10/2019 0945   ALBUMIN 4.4 02/11/2017 0755   AST 14 (L) 05/10/2019 0945   AST  22 06/19/2018 0931   AST 27 02/11/2017 0755   ALT 10 05/10/2019 0945   ALT 13 06/19/2018 0931   ALT 24 02/11/2017 0755   ALKPHOS 88 05/10/2019 0945   ALKPHOS 58 02/11/2017 0755   BILITOT 0.2 (L) 05/10/2019 0945   BILITOT 0.2 (L) 06/19/2018 0931   BILITOT 0.43 02/11/2017 0755   GFRNONAA >60 05/10/2019 0945   GFRNONAA 59 (L) 06/19/2018 0931   GFRAA >60 05/10/2019 0945   GFRAA >60 06/19/2018 0931    INo results found for: SPEP, UPEP  Lab Results  Component Value Date   WBC 5.7 05/10/2019   NEUTROABS 4.1 05/10/2019   HGB 10.0 (L) 05/10/2019   HCT 32.5 (L) 05/10/2019   MCV 90.8 05/10/2019   PLT 494 (H) 05/10/2019      Chemistry      Component Value Date/Time   NA 140 05/10/2019 0945   NA 140 02/11/2017 0755   K 4.3 05/10/2019 0945   K 4.4 02/11/2017 0755   CL 102 05/10/2019 0945   CO2 26 05/10/2019 0945   CO2 26 02/11/2017 0755   BUN 13 05/10/2019 0945   BUN 7.9 02/11/2017 0755   CREATININE 1.04 (H) 05/10/2019 0945   CREATININE 1.06 (H) 06/19/2018 0931   CREATININE 0.7 02/11/2017 0755      Component Value Date/Time   CALCIUM 9.2 05/10/2019 0945   CALCIUM 9.6 02/11/2017 0755   ALKPHOS 88 05/10/2019 0945   ALKPHOS 58 02/11/2017 0755   AST 14 (L) 05/10/2019 0945   AST 22 06/19/2018 0931   AST 27 02/11/2017 0755   ALT 10 05/10/2019 0945   ALT 13 06/19/2018 0931   ALT 24 02/11/2017 0755   BILITOT 0.2 (L) 05/10/2019 0945   BILITOT 0.2 (L) 06/19/2018 0931   BILITOT 0.43 02/11/2017 0755      No results found for: LABCA2  No components found for: LABCA125  No results for input(s): INR in the last 168 hours.  Urinalysis    Component Value Date/Time   COLORURINE STRAW (A) 11/22/2017 0940   APPEARANCEUR CLEAR 11/22/2017 0940   LABSPEC 1.002 (L) 11/22/2017 0940   LABSPEC 1.005 10/09/2015 1239   PHURINE 7.0 11/22/2017 0940   GLUCOSEU NEGATIVE 11/22/2017 0940   GLUCOSEU Negative 10/09/2015 1239   HGBUR NEGATIVE 11/22/2017 0940   BILIRUBINUR NEGATIVE  11/22/2017 0940   BILIRUBINUR Negative 10/09/2015 1239   KETONESUR NEGATIVE 11/22/2017 0940   PROTEINUR NEGATIVE 04/03/2019 1050   UROBILINOGEN 0.2 10/09/2015 1239   NITRITE NEGATIVE 11/22/2017 0940   LEUKOCYTESUR NEGATIVE 11/22/2017 0940   LEUKOCYTESUR Negative 10/09/2015 1239    STUDIES: CT Abdomen Pelvis W Contrast  Result Date: 04/23/2019 CLINICAL DATA:  Ovarian cancer staging. EXAM: CT ABDOMEN AND PELVIS WITH CONTRAST TECHNIQUE: Multidetector CT  imaging of the abdomen and pelvis was performed using the standard protocol following bolus administration of intravenous contrast. CONTRAST:  37m OMNIPAQUE IOHEXOL 300 MG/ML  SOLN COMPARISON:  09/28/2018 FINDINGS: Lower chest: Signs of basilar atelectasis in the setting of a moderately large left-sided pleural effusion question mild pleural thickening (image 12, series 2) approximately 3 mm greatest thickness. Associated volume loss in the left lower lobe as above. Hepatobiliary: Hemangioma in the posterior right hepatic lobe as before. Scalloped hepatic margins due to moderate volume of peritoneal fluid that has developed since the previous exam. No signs of intraparenchymal hepatic lesion. The portal vein remains patent. Post cholecystectomy. Pancreas: Pancreas is unremarkable without signs of focal lesion, ductal dilation or inflammation. Spleen: The spleen is normal aside from fluid which surrounds the splenic margin anteriorly. Adrenals/Urinary Tract: Adrenal glands are normal. Kidneys with smooth bilateral renal contours and no suspicious, focal hip renal lesion. Stomach/Bowel: Bowel loops in the abdomen are surrounded by peritoneal fluid. Thickening of mesenteric reflections and tethered appearance of bowel loops particularly the distal sigmoid colon and small bowel loops in the pelvis (image 63, series 2) these loops are held in close apposition with thickening of fascial in mesenteric planes, as much as 4 mm greatest thickness in this area.  Clustered bowel loops with respect to mid and distal small bowel in the lower and central abdomen/upper pelvis. Wall there is some mild distension there is no overt evidence of obstruction with contrast passing into the distal small bowel and into the proximal and mid colon. Appendix not visualized. Vascular/Lymphatic: No signs of significant atherosclerotic change in the abdominal aorta. No signs of aneurysmal dilation. No adenopathy in the retroperitoneum or in the upper abdomen. No signs of adenopathy in the pelvis. Reproductive: Post hysterectomy. Other: Peritoneal thickening throughout. Signs of nodularity as well. (Image 27, series 2) 4 mm nodule in the left upper quadrant adjacent to spleen and colon. (Image 70, series 2) 13 x 17 mm nodule in the cul-de-sac previously approximately 9 x 14 mm with some fullness of the right vaginal apex as well. Musculoskeletal: No signs of acute bone finding or evidence of destructive bone process. IMPRESSION: 1. Interval development of moderate volume peritoneal fluid. 2. Signs of peritoneal carcinomatosis. The largest nodule in the cul-de-sac measures 13 x 17 mm, previously 9 x 14 mm. 3. Mesenteric and peritoneal thickening with angulated bowel loops particularly mid to distal small bowel and rectosigmoid colon. Mild dilation of small bowel loops may indicate mild partial small bowel obstruction. There are no overt signs of obstruction currently. 4. Signs of basilar atelectasis in the setting of a moderately large left-sided pleural effusion. Moderate pleural effusion may be indicative of malignant pleural fluid based on appearance and other findings on the exam. These results will be called to the ordering clinician or representative by the Radiologist Assistant, and communication documented in the PACS or zVision Dashboard. Electronically Signed   By: GZetta BillsM.D.   On: 04/23/2019 08:42   ECHOCARDIOGRAM COMPLETE  Result Date: 05/09/2019    ECHOCARDIOGRAM REPORT    Patient Name:   MSHARAY BELLISSIMODate of Exam: 05/09/2019 Medical Rec #:  [id number] Height:       63.0 in Accession #:    [id number]:       115.1 lb Date of Birth:  512/23/1967 BSA:          1.529 m Patient Age:    89years   BP:  107/78 mmHg Patient Gender: F          HR:           73 bpm. Exam Location:  Outpatient Procedure: 2D Echo Indications:    v67.2 chemo  History:        Patient has prior history of Echocardiogram examinations, most                 recent 03/05/2016. Cancer.  Sonographer:    Jannett Celestine RDCS (AE) Referring Phys: [place]  1. Left ventricular ejection fraction, by estimation, is 55 to 60%. The left ventricle has normal function. The left ventricle has no regional wall motion abnormalities. Left ventricular diastolic parameters are consistent with Grade II diastolic dysfunction (pseudonormalization). The average left ventricular global longitudinal strain is -16.8 %.  2. Right ventricular systolic function is normal. The right ventricular size is normal. There is normal pulmonary artery systolic pressure.  3. Large pleural effusion in the left lateral region.  4. The mitral valve is normal in structure. No evidence of mitral valve regurgitation. No evidence of mitral stenosis.  5. The aortic valve is normal in structure. Aortic valve regurgitation is not visualized. No aortic stenosis is present.  6. The inferior vena cava is normal in size with greater than 50% respiratory variability, suggesting right atrial pressure of 3 mmHg. FINDINGS  Left Ventricle: Left ventricular ejection fraction, by estimation, is 55 to 60%. The left ventricle has normal function. The left ventricle has no regional wall motion abnormalities. The average left ventricular global longitudinal strain is -16.8 %. The left ventricular internal cavity size was normal in size. There is no left ventricular hypertrophy. Left ventricular diastolic parameters are consistent with Grade II  diastolic dysfunction (pseudonormalization). Normal left ventricular filling pressure. Right Ventricle: The right ventricular size is normal. No increase in right ventricular wall thickness. Right ventricular systolic function is normal. There is normal pulmonary artery systolic pressure. The tricuspid regurgitant velocity is 1.57 m/s, and  with an assumed right atrial pressure of 3 mmHg, the estimated right ventricular systolic pressure is 63.8 mmHg. Left Atrium: Left atrial size was normal in size. Right Atrium: Right atrial size was normal in size. Pericardium: There is no evidence of pericardial effusion. Mitral Valve: The mitral valve is normal in structure. Normal mobility of the mitral valve leaflets. No evidence of mitral valve regurgitation. No evidence of mitral valve stenosis. Tricuspid Valve: The tricuspid valve is normal in structure. Tricuspid valve regurgitation is trivial. No evidence of tricuspid stenosis. Aortic Valve: The aortic valve is normal in structure. Aortic valve regurgitation is not visualized. No aortic stenosis is present. Pulmonic Valve: The pulmonic valve was normal in structure. Pulmonic valve regurgitation is not visualized. No evidence of pulmonic stenosis. Aorta: The aortic root is normal in size and structure. Venous: The inferior vena cava is normal in size with greater than 50% respiratory variability, suggesting right atrial pressure of 3 mmHg. IAS/Shunts: No atrial level shunt detected by color flow Doppler. Additional Comments: There is a large pleural effusion in the left lateral region.  LEFT VENTRICLE PLAX 2D LVIDd:         4.20 cm  Diastology LVIDs:         2.90 cm  LV e' lateral:   7.07 cm/s LV PW:         0.60 cm  LV E/e' lateral: 6.9 LV IVS:        0.70 cm  LV e' medial:  6.20 cm/s LVOT diam:     1.70 cm  LV E/e' medial:  7.9 LV SV:         33 LV SV Index:   21       2D Longitudinal Strain LVOT Area:     2.27 cm 2D Strain GLS (A2C):   -18.7 %                          2D Strain GLS (A3C):   -15.9 %                         2D Strain GLS (A4C):   -15.8 %                         2D Strain GLS Avg:     -16.8 % RIGHT VENTRICLE RV S prime:     11.20 cm/s TAPSE (M-mode): 1.4 cm LEFT ATRIUM             Index       RIGHT ATRIUM           Index LA diam:        2.80 cm 1.83 cm/m  RA Area:     10.90 cm LA Vol (A2C):   22.7 ml 14.85 ml/m RA Volume:   22.00 ml  14.39 ml/m LA Vol (A4C):   15.8 ml 10.33 ml/m LA Biplane Vol: 19.8 ml 12.95 ml/m  AORTIC VALVE LVOT Vmax:   79.00 cm/s LVOT Vmean:  54.000 cm/s LVOT VTI:    0.144 m  AORTA Ao Root diam: 2.70 cm MITRAL VALVE               TRICUSPID VALVE MV Area (PHT): 3.89 cm    TR Peak grad:   9.9 mmHg MV Decel Time: 195 msec    TR Vmax:        157.00 cm/s MV E velocity: 48.80 cm/s MV A velocity: 44.60 cm/s  SHUNTS MV E/A ratio:  1.09        Systemic VTI:  0.14 m                            Systemic Diam: 1.70 cm Skeet Latch MD Electronically signed by Skeet Latch MD Signature Date/Time: 05/09/2019/1:10:47 PM    Final      ELIGIBLE FOR AVAILABLE RESEARCH PROTOCOL:  no  ASSESSMENT: 54 y.o. BRCA negative [place] woman status post radical tumor debulking with optimal cytoreduction (R0) 03/27/2015 for a stage IIIC, high-grade right fallopian tube carcinoma  (a) baseline CA-125 was 1226.  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)  (c) tumor is PD-L1 negative (02/24/2016)  (d) tumor is strongly estrogen receptor positive, progesterone receptor negative (02/24/2016)  (1) adjuvant chemotherapy consisted of carboplatin and paclitaxel for 6 doses, begun 04/14/2015, completed 08/11/2015  (a) paclitaxel was omitted from cycle 5 and dose reduced on cycle 6 because of neuropathy   (b) a "make up" dose of paclitaxel was given 08/25/2015  (c) last carboplatin dose  was 08/11/2015  (d) CA-125 normalized by 06/16/2015   (2) FIRST RECURRENCE: January 2018  (a) CA-125 rise beginning November 2017 led to CT scans and PET scans December 2017, all negative  (b)  exploratory laparotomy 02/24/2016 showed pathologically confirmed recurrence, with miliary disease involving all examined surfaces  (c) port-site metastases noted 03/08/2016  (d) the January 2018 tumor sample was tested for the estrogen receptor and he was 80 percent positive with strong staining, progesterone receptor negative  (3) carboplatin/liposomal doxorubicin started 03/05/2016, repeated every three weeks x4, completed 06/04/2016.  (a) cycle 3 delayed one week because of neutropenia; OnPro added  (b) CA 125 normalized after cycle 3  (4) RUCAPARIB maintenance started 07/02/2016  (a) restaging 10/01/2016: Normal CA 125, negative CT of the abdomen and pelvis  (b) labs 11/24/2016 shows a rise in her CA 125-43.4, with continuing rise thereafter  (c) rucaparib discontinued October 2018 with rising CA 125  (5) anastrozole started December 23, 2016-stopped after a couple of weeks with poor tolerance  SECOND RECURRENCE: (6) Gemcitabine/Bevacizumab started on 02/04/2017  (a) gemcitabine omitted cycle 4 because of intercurrent infection  (b) gemcitabine resumed with cycle 5, with intervening rise in  Ca 125  (c) repeat CT scan of the abdomen and pelvis on 05/26/2017 does not confirm obvious disease progression  (d) cycle 5 of gemcitabine/bevacizumab delayed 1 week   (e) gemcitabine/ bevacizumab discontinued after 6 cycles, with rising CA 125 (last dose 06/24/2017)  (7) foundation one testing did not show a high mutation burden and the microsatellite status was stable.  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  These were not immediately actionable  (8) Abraxane started 07/12/2017, given weekly or weeks 1 and 2 of each 3 weeks cycle, last dose 11/15/2017 (5 cycles)  (a) cycle 2 started  08/09/2017, will be day 1 day 8 only  (b) cycle 3 scheduled to start 09/13/2017, day 1 day 8 at patient request  (c) CT of the abdomen and pelvis 11/28/2017 shows no measurable disease  (d) PET scan 01/25/2018 and MRI of the pelvis 02/03/2018 show no measurable disease  (e) patient has symptomatic ascites and a rapidly rising Ca1 25  (9) started cisplatin/gemcitabine days 1 and 8 of each 21-day cycle on 02/18/2018  (a) switched to carboplatin/ gemcitabine 07/17/2018 due to neuropathy  (b) carbo/gemzar changed to Q14 days 07/24/2018 due to neutropenia  (c) considered niraparib/Zejula 100 mg, 2 tabs daily at the completion of chemotherapy  (10) associated problems  (a) anemia due to chemotherapy: On Aranesp  (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 10/25/18 Consenting for AMV-564 clinical trial 21. 11/13/18 AMV-564 initiation 75 mcg dose 22. 10/8 Dropped down to 50 mcg dose (I think that date is correct) 23. 10/19-21/20 Tx held due to Neutropenia 24. 12/06/18 Given Neulasta 25. 12/07/18 AMV-564 resumed 26. 01/03/19 CT C/A/P 10% RECIST progression 27. 01/15/19 Increased dose to 75 mcg/daily 28. 01/17/19 Thoracentesis with cytology c/w Mullerian primary  29. Paracentesis 02/05/2019: cytology c/w adenocarcinoma  (12) schedule of thoracenteses:  (a) left 01/17/2019 [facility])  (b) left 02/06/2019 [facility])  (c)   (13) schedule of paracenteses:  (a) 01/30/2019 ([place])  (b) 02/06/2019 ([place])  (c) 02/18/2018 ([place])  (d) 04/17/2019 ([place])  (14) abraxane started 02/20/2019, repeated days 1 and 8 of every 21 day cycle  (a) bevacizumab/Avastin started 02/20/2019 and repeated every 21 days  (b) She cannot tolerate Zarxio due to side effects consisting of low grade fever, body aches, malaise, fatigue, and itching, whereas she has been able to tolerate Granix  though  (c) bevacizumab and Abraxane discontinued after  04/10/2019 dose with evidence of progression  (15) exemestane started 04/24/2019, discontinued after 2 weeks as patient opted for chemotherapy  (16) to start liposomal doxorubicin/carboplatin 05/10/2019, repeated every 28 days  (a) echocardiogram  (b) patient is interested in [place] if available through the "right to try" program   PLAN: Demetrius is doing moderately well today.  Her labs are stable.  I reviewed her echocardiogram with her which is normal.  She will start treatment today with liposomal doxorubicin and carboplatin today.    Sondi and I reviewed her GERD.  I recommended she try switching up her PPI from omeprazole to a different one such as nexium or prevacid.  She is going to consider this.    Ladelle and I talked about her situation.  We discussed palliative care and she understands what it is, and will let me know if/when she is ready for palliative care.  I let Athalia know that I will review her request for [name] with Dr. Jana Hakim.    We are continuing to work on getting [id number] treatment for [place].  Several staff members in pharmacy and Dr. Virgie Dad nurse are working on getting this, and we have not been successful at this point.    Libra will return next week for labs and f/u with Dr. Jana Hakim.  She was recommended to continue with the appropriate pandemic precautions. She knows to call for any questions that may arise between now and her next appointment.  We are happy to see her sooner if needed.  Total encounter time 30 minutes.Wilber Bihari, NP 05/10/19 1:54 PM Medical Oncology and Hematology [facility] [place], [place] [address] Tel. [phone]    Fax. [phone]   *Total Encounter Time as defined by the Centers for Medicare and Medicaid Services includes, in addition to the face-to-face time of a patient visit (documented in the note above) non-face-to-face time: obtaining  and reviewing outside history, ordering and reviewing medications, tests or procedures, care coordination (communications with other health care professionals or caregivers) and documentation in the medical record.

## 2019-05-10 ENCOUNTER — Encounter: Payer: Self-pay | Admitting: Adult Health

## 2019-05-10 ENCOUNTER — Inpatient Hospital Stay: Payer: 59

## 2019-05-10 ENCOUNTER — Other Ambulatory Visit: Payer: 59

## 2019-05-10 ENCOUNTER — Telehealth: Payer: Self-pay | Admitting: *Deleted

## 2019-05-10 ENCOUNTER — Inpatient Hospital Stay (HOSPITAL_BASED_OUTPATIENT_CLINIC_OR_DEPARTMENT_OTHER): Payer: 59 | Admitting: Adult Health

## 2019-05-10 ENCOUNTER — Other Ambulatory Visit: Payer: Self-pay

## 2019-05-10 ENCOUNTER — Ambulatory Visit: Payer: 59 | Admitting: Adult Health

## 2019-05-10 ENCOUNTER — Telehealth: Payer: Self-pay | Admitting: Adult Health

## 2019-05-10 ENCOUNTER — Other Ambulatory Visit: Payer: Self-pay | Admitting: Oncology

## 2019-05-10 VITALS — BP 106/76 | HR 79 | Temp 98.0°F | Resp 18 | Ht 63.0 in | Wt 113.2 lb

## 2019-05-10 DIAGNOSIS — T451X5A Adverse effect of antineoplastic and immunosuppressive drugs, initial encounter: Secondary | ICD-10-CM

## 2019-05-10 DIAGNOSIS — C786 Secondary malignant neoplasm of retroperitoneum and peritoneum: Secondary | ICD-10-CM

## 2019-05-10 DIAGNOSIS — C5701 Malignant neoplasm of right fallopian tube: Secondary | ICD-10-CM

## 2019-05-10 DIAGNOSIS — D702 Other drug-induced agranulocytosis: Secondary | ICD-10-CM

## 2019-05-10 DIAGNOSIS — C762 Malignant neoplasm of abdomen: Secondary | ICD-10-CM

## 2019-05-10 DIAGNOSIS — Z95828 Presence of other vascular implants and grafts: Secondary | ICD-10-CM

## 2019-05-10 DIAGNOSIS — D701 Agranulocytosis secondary to cancer chemotherapy: Secondary | ICD-10-CM

## 2019-05-10 DIAGNOSIS — Z5111 Encounter for antineoplastic chemotherapy: Secondary | ICD-10-CM | POA: Diagnosis not present

## 2019-05-10 DIAGNOSIS — Z7189 Other specified counseling: Secondary | ICD-10-CM

## 2019-05-10 LAB — COMPREHENSIVE METABOLIC PANEL
ALT: 10 U/L (ref 0–44)
AST: 14 U/L — ABNORMAL LOW (ref 15–41)
Albumin: 3.2 g/dL — ABNORMAL LOW (ref 3.5–5.0)
Alkaline Phosphatase: 88 U/L (ref 38–126)
Anion gap: 12 (ref 5–15)
BUN: 13 mg/dL (ref 6–20)
CO2: 26 mmol/L (ref 22–32)
Calcium: 9.2 mg/dL (ref 8.9–10.3)
Chloride: 102 mmol/L (ref 98–111)
Creatinine, Ser: 1.04 mg/dL — ABNORMAL HIGH (ref 0.44–1.00)
GFR calc Af Amer: >60 mL/min (ref >60)
GFR calc non Af Amer: >60 mL/min (ref >60)
Glucose, Bld: 102 mg/dL — ABNORMAL HIGH (ref 70–99)
Potassium: 4.3 mmol/L (ref 3.5–5.1)
Sodium: 140 mmol/L (ref 135–145)
Total Bilirubin: 0.2 mg/dL — ABNORMAL LOW (ref 0.3–1.2)
Total Protein: 7.3 g/dL (ref 6.5–8.1)

## 2019-05-10 LAB — CBC WITH DIFFERENTIAL/PLATELET
Abs Immature Granulocytes: 0.02 10*3/uL (ref 0.00–0.07)
Basophils Absolute: 0 10*3/uL (ref 0.0–0.1)
Basophils Relative: 0 %
Eosinophils Absolute: 0.1 10*3/uL (ref 0.0–0.5)
Eosinophils Relative: 1 %
HCT: 32.5 % — ABNORMAL LOW (ref 36.0–46.0)
Hemoglobin: 10 g/dL — ABNORMAL LOW (ref 12.0–15.0)
Immature Granulocytes: 0 %
Lymphocytes Relative: 18 %
Lymphs Abs: 1 10*3/uL (ref 0.7–4.0)
MCH: 27.9 pg (ref 26.0–34.0)
MCHC: 30.8 g/dL (ref 30.0–36.0)
MCV: 90.8 fL (ref 80.0–100.0)
Monocytes Absolute: 0.4 10*3/uL (ref 0.1–1.0)
Monocytes Relative: 8 %
Neutro Abs: 4.1 10*3/uL (ref 1.7–7.7)
Neutrophils Relative %: 73 %
Platelets: 494 10*3/uL — ABNORMAL HIGH (ref 150–400)
RBC: 3.58 MIL/uL — ABNORMAL LOW (ref 3.87–5.11)
RDW: 18.2 % — ABNORMAL HIGH (ref 11.5–15.5)
WBC: 5.7 10*3/uL (ref 4.0–10.5)
nRBC: 0 % (ref 0.0–0.2)

## 2019-05-10 LAB — FERRITIN: Ferritin: 190 ng/mL (ref 11–307)

## 2019-05-10 MED ORDER — HEPARIN SOD (PORK) LOCK FLUSH 100 UNIT/ML IV SOLN
500.0000 [IU] | Freq: Once | INTRAVENOUS | Status: AC | PRN
  Administered 2019-05-10: 500 [IU]
  Filled 2019-05-10: qty 5

## 2019-05-10 MED ORDER — FAMOTIDINE IN NACL 20-0.9 MG/50ML-% IV SOLN
20.0000 mg | Freq: Once | INTRAVENOUS | Status: AC
  Administered 2019-05-10: 20 mg via INTRAVENOUS

## 2019-05-10 MED ORDER — PEGFILGRASTIM 6 MG/0.6ML ~~LOC~~ PSKT
PREFILLED_SYRINGE | SUBCUTANEOUS | Status: AC
  Filled 2019-05-10: qty 0.6

## 2019-05-10 MED ORDER — SODIUM CHLORIDE 0.9 % IV SOLN: 377.0000 mg | Freq: Once | INTRAVENOUS | Status: DC

## 2019-05-10 MED ORDER — FAMOTIDINE IN NACL 20-0.9 MG/50ML-% IV SOLN
INTRAVENOUS | Status: AC
  Filled 2019-05-10: qty 50

## 2019-05-10 MED ORDER — DIPHENHYDRAMINE HCL 50 MG/ML IJ SOLN
50.0000 mg | Freq: Once | INTRAMUSCULAR | Status: AC
  Administered 2019-05-10: 50 mg via INTRAVENOUS

## 2019-05-10 MED ORDER — DIPHENHYDRAMINE HCL 50 MG/ML IJ SOLN
INTRAMUSCULAR | Status: AC
  Filled 2019-05-10: qty 1

## 2019-05-10 MED ORDER — SODIUM CHLORIDE 0.9 % IV SOLN
377.0000 mg | Freq: Once | INTRAVENOUS | Status: AC
  Administered 2019-05-10: 380 mg via INTRAVENOUS
  Filled 2019-05-10: qty 38

## 2019-05-10 MED ORDER — DEXAMETHASONE SODIUM PHOSPHATE 10 MG/ML IJ SOLN
10.0000 mg | Freq: Once | INTRAMUSCULAR | Status: AC
  Administered 2019-05-10: 10 mg via INTRAVENOUS

## 2019-05-10 MED ORDER — DEXAMETHASONE SODIUM PHOSPHATE 10 MG/ML IJ SOLN
INTRAMUSCULAR | Status: AC
  Filled 2019-05-10: qty 1

## 2019-05-10 MED ORDER — SODIUM CHLORIDE 0.9 % IV SOLN
150.0000 mg | Freq: Once | INTRAVENOUS | Status: AC
  Administered 2019-05-10: 150 mg via INTRAVENOUS
  Filled 2019-05-10: qty 150

## 2019-05-10 MED ORDER — DOXORUBICIN HCL LIPOSOMAL CHEMO INJECTION 2 MG/ML
30.0000 mg/m2 | Freq: Once | INTRAVENOUS | Status: AC
  Administered 2019-05-10: 46 mg via INTRAVENOUS
  Filled 2019-05-10: qty 23

## 2019-05-10 MED ORDER — PALONOSETRON HCL INJECTION 0.25 MG/5ML
INTRAVENOUS | Status: AC
  Filled 2019-05-10: qty 5

## 2019-05-10 MED ORDER — SODIUM CHLORIDE 0.9% FLUSH
10.0000 mL | INTRAVENOUS | Status: DC | PRN
  Administered 2019-05-10: 10 mL
  Filled 2019-05-10: qty 10

## 2019-05-10 MED ORDER — PALONOSETRON HCL INJECTION 0.25 MG/5ML
0.2500 mg | Freq: Once | INTRAVENOUS | Status: AC
  Administered 2019-05-10: 0.25 mg via INTRAVENOUS

## 2019-05-10 MED ORDER — DEXTROSE 5 % IV SOLN
Freq: Once | INTRAVENOUS | Status: AC
  Filled 2019-05-10: qty 250

## 2019-05-10 MED ORDER — PEGFILGRASTIM 6 MG/0.6ML ~~LOC~~ PSKT
6.0000 mg | PREFILLED_SYRINGE | Freq: Once | SUBCUTANEOUS | Status: AC
  Administered 2019-05-10: 6 mg via SUBCUTANEOUS

## 2019-05-10 NOTE — Telephone Encounter (Signed)
Provided Dr. Jana Hakim with phone number and email to [employer]'s early Access Program to apply for possible [id number] use.  [place] phone number: [phone] Email: [email or web address]  B. Corey Skains, PharmD, BCPS, BCOP

## 2019-05-10 NOTE — Patient Instructions (Signed)
[place] Discharge Instructions for Patients Receiving Chemotherapy  Today you received the following chemotherapy agents: Doxil, Carboplatin   To help prevent nausea and vomiting after your treatment, we encourage you to take your nausea medication as directed.    If you develop nausea and vomiting that is not controlled by your nausea medication, call the clinic.   BELOW ARE SYMPTOMS THAT SHOULD BE REPORTED IMMEDIATELY:  *FEVER GREATER THAN 100.5 F  *CHILLS WITH OR WITHOUT FEVER  NAUSEA AND VOMITING THAT IS NOT CONTROLLED WITH YOUR NAUSEA MEDICATION  *UNUSUAL SHORTNESS OF BREATH  *UNUSUAL BRUISING OR BLEEDING  TENDERNESS IN MOUTH AND THROAT WITH OR WITHOUT PRESENCE OF ULCERS  *URINARY PROBLEMS  *BOWEL PROBLEMS  UNUSUAL RASH Items with * indicate a potential emergency and should be followed up as soon as possible.  Feel free to call the clinic should you have any questions or concerns. The clinic phone number is [phone].  Please show the [place] at check-in to the Emergency Department and triage nurse.  Doxorubicin Liposomal injection What is this medicine? LIPOSOMAL DOXORUBICIN (LIP oh som al dox oh ROO bi sin) is a chemotherapy drug. This medicine is used to treat many kinds of cancer like Kaposi's sarcoma, multiple myeloma, and ovarian cancer. This medicine may be used for other purposes; ask your health care provider or pharmacist if you have questions. COMMON BRAND NAME(S): Doxil, Lipodox What should I tell my health care provider before I take this medicine? They need to know if you have any of these conditions:  blood disorders  heart disease  infection (especially a virus infection such as chickenpox, cold sores, or herpes)  liver disease  recent or ongoing radiation therapy  an unusual or allergic reaction to doxorubicin, other chemotherapy agents, soybeans, other medicines, foods, dyes, or preservatives  pregnant or  trying to get pregnant  breast-feeding How should I use this medicine? This drug is given as an infusion into a vein. It is administered in a hospital or clinic by a specially trained health care professional. If you have pain, swelling, burning or any unusual feeling around the site of your injection, tell your health care professional right away. Talk to your pediatrician regarding the use of this medicine in children. Special care may be needed. Overdosage: If you think you have taken too much of this medicine contact a poison control center or emergency room at once. NOTE: This medicine is only for you. Do not share this medicine with others. What if I miss a dose? It is important not to miss your dose. Call your doctor or health care professional if you are unable to keep an appointment. What may interact with this medicine? Do not take this medicine with any of the following medications:  zidovudine This medicine may also interact with the following medications:  medicines to increase blood counts like filgrastim, pegfilgrastim, sargramostim  vaccines Talk to your doctor or health care professional before taking any of these medicines:  acetaminophen  aspirin  ibuprofen  ketoprofen  naproxen This list may not describe all possible interactions. Give your health care provider a list of all the medicines, herbs, non-prescription drugs, or dietary supplements you use. Also tell them if you smoke, drink alcohol, or use illegal drugs. Some items may interact with your medicine. What should I watch for while using this medicine? Your condition will be monitored carefully while you are receiving this medicine. You may need blood work done while you are  taking this medicine. This drug may make you feel generally unwell. This is not uncommon, as chemotherapy can affect healthy cells as well as cancer cells. Report any side effects. Continue your course of treatment even though you feel  ill unless your doctor tells you to stop. Your urine may turn orange-red for a few days after your dose. This is not blood. If your urine is dark or brown, call your doctor. In some cases, you may be given additional medicines to help with side effects. Follow all directions for their use. Talk to your doctor about your risk of cancer. You may be more at risk for certain types of cancers if you take this medicine. Do not become pregnant while taking this medicine or for 6 months after stopping it. Women should inform their healthcare professional if they wish to become pregnant or think they may be pregnant. Men should not father a child while taking this medicine and for 6 months after stopping it. There is a potential for serious side effects to an unborn child. Talk to your health care professional or pharmacist for more information. Do not breast-feed an infant while taking this medicine. This medicine has caused ovarian failure in some women. This medicine may make it more difficult to get pregnant. Talk to your healthcare professional if you are concerned about your fertility. This medicine has caused decreased sperm counts in some men. This may make it more difficult to father a child. Talk to your healthcare professional if you are concerned about your fertility. This medicine may cause a decrease in Co-Enzyme Q-10. You should make sure that you get enough Co-Enzyme Q-10 while you are taking this medicine. Discuss the foods you eat and the vitamins you take with your health care professional. What side effects may I notice from receiving this medicine? Side effects that you should report to your doctor or health care professional as soon as possible:  allergic reactions like skin rash, itching or hives, swelling of the face, lips, or tongue  low blood counts - this medicine may decrease the number of white blood cells, red blood cells and platelets. You may be at increased risk for infections  and bleeding.  signs of hand-foot syndrome - tingling or burning, redness, flaking, swelling, small blisters, or small sores on the palms of your hands or the soles of your feet  signs of infection - fever or chills, cough, sore throat, pain or difficulty passing urine  signs of decreased platelets or bleeding - bruising, pinpoint red spots on the skin, black, tarry stools, blood in the urine  signs of decreased red blood cells - unusually weak or tired, fainting spells, lightheadedness  back pain, chills, facial flushing, fever, headache, tightness in the chest or throat during the infusion  breathing problems  chest pain  fast, irregular heartbeat  mouth pain, redness, sores  pain, swelling, redness at site where injected  pain, tingling, numbness in the hands or feet  swelling of ankles, feet, or hands  vomiting Side effects that usually do not require medical attention (report to your doctor or health care professional if they continue or are bothersome):  diarrhea  hair loss  loss of appetite  nail discoloration or damage  nausea  red or watery eyes  red colored urine  stomach upset This list may not describe all possible side effects. Call your doctor for medical advice about side effects. You may report side effects to FDA at 1-800-FDA-1088. Where should I keep  my medicine? This drug is given in a hospital or clinic and will not be stored at home. NOTE: This sheet is a summary. It may not cover all possible information. If you have questions about this medicine, talk to your doctor, pharmacist, or health care provider.  2020 Elsevier/Gold Standard (2017-10-10 15:13:26)  Carboplatin injection What is this medicine? CARBOPLATIN (KAR boe pla tin) is a chemotherapy drug. It targets fast dividing cells, like cancer cells, and causes these cells to die. This medicine is used to treat ovarian cancer and many other cancers. This medicine may be used for other  purposes; ask your health care provider or pharmacist if you have questions. COMMON BRAND NAME(S): Paraplatin What should I tell my health care provider before I take this medicine? They need to know if you have any of these conditions:  blood disorders  hearing problems  kidney disease  recent or ongoing radiation therapy  an unusual or allergic reaction to carboplatin, cisplatin, other chemotherapy, other medicines, foods, dyes, or preservatives  pregnant or trying to get pregnant  breast-feeding How should I use this medicine? This drug is usually given as an infusion into a vein. It is administered in a hospital or clinic by a specially trained health care professional. Talk to your pediatrician regarding the use of this medicine in children. Special care may be needed. Overdosage: If you think you have taken too much of this medicine contact a poison control center or emergency room at once. NOTE: This medicine is only for you. Do not share this medicine with others. What if I miss a dose? It is important not to miss a dose. Call your doctor or health care professional if you are unable to keep an appointment. What may interact with this medicine?  medicines for seizures  medicines to increase blood counts like filgrastim, pegfilgrastim, sargramostim  some antibiotics like amikacin, gentamicin, neomycin, streptomycin, tobramycin  vaccines Talk to your doctor or health care professional before taking any of these medicines:  acetaminophen  aspirin  ibuprofen  ketoprofen  naproxen This list may not describe all possible interactions. Give your health care provider a list of all the medicines, herbs, non-prescription drugs, or dietary supplements you use. Also tell them if you smoke, drink alcohol, or use illegal drugs. Some items may interact with your medicine. What should I watch for while using this medicine? Your condition will be monitored carefully while you  are receiving this medicine. You will need important blood work done while you are taking this medicine. This drug may make you feel generally unwell. This is not uncommon, as chemotherapy can affect healthy cells as well as cancer cells. Report any side effects. Continue your course of treatment even though you feel ill unless your doctor tells you to stop. In some cases, you may be given additional medicines to help with side effects. Follow all directions for their use. Call your doctor or health care professional for advice if you get a fever, chills or sore throat, or other symptoms of a cold or flu. Do not treat yourself. This drug decreases your body's ability to fight infections. Try to avoid being around people who are sick. This medicine may increase your risk to bruise or bleed. Call your doctor or health care professional if you notice any unusual bleeding. Be careful brushing and flossing your teeth or using a toothpick because you may get an infection or bleed more easily. If you have any dental work done, tell your dentist  you are receiving this medicine. Avoid taking products that contain aspirin, acetaminophen, ibuprofen, naproxen, or ketoprofen unless instructed by your doctor. These medicines may hide a fever. Do not become pregnant while taking this medicine. Women should inform their doctor if they wish to become pregnant or think they might be pregnant. There is a potential for serious side effects to an unborn child. Talk to your health care professional or pharmacist for more information. Do not breast-feed an infant while taking this medicine. What side effects may I notice from receiving this medicine? Side effects that you should report to your doctor or health care professional as soon as possible:  allergic reactions like skin rash, itching or hives, swelling of the face, lips, or tongue  signs of infection - fever or chills, cough, sore throat, pain or difficulty passing  urine  signs of decreased platelets or bleeding - bruising, pinpoint red spots on the skin, black, tarry stools, nosebleeds  signs of decreased red blood cells - unusually weak or tired, fainting spells, lightheadedness  breathing problems  changes in hearing  changes in vision  chest pain  high blood pressure  low blood counts - This drug may decrease the number of white blood cells, red blood cells and platelets. You may be at increased risk for infections and bleeding.  nausea and vomiting  pain, swelling, redness or irritation at the injection site  pain, tingling, numbness in the hands or feet  problems with balance, talking, walking  trouble passing urine or change in the amount of urine Side effects that usually do not require medical attention (report to your doctor or health care professional if they continue or are bothersome):  hair loss  loss of appetite  metallic taste in the mouth or changes in taste This list may not describe all possible side effects. Call your doctor for medical advice about side effects. You may report side effects to FDA at 1-800-FDA-1088. Where should I keep my medicine? This drug is given in a hospital or clinic and will not be stored at home. NOTE: This sheet is a summary. It may not cover all possible information. If you have questions about this medicine, talk to your doctor, pharmacist, or health care provider.  2020 Elsevier/Gold Standard (2007-05-09 14:38:05)

## 2019-05-10 NOTE — Telephone Encounter (Signed)
This RN contacted [employer] per information given .  Was informed by [place] to establish an account at [facility].  This RN attempted above but was not successful though it was noted on page " physician to establish account"  Though not able to make an account this RN reviewed data on site for compassionate release which mentioned contacted your local representative.  This RN contacted pharmacy and was given the name of Joanne Gavel at phone number [phone].  This RN was able to discuss request for compassionate release for investigational drug - study drug data given.  Merry Proud states he will follow up and inquire further on above and contact this RN.  Note above task took approximately 1 hour.

## 2019-05-10 NOTE — Telephone Encounter (Signed)
Scheduled appts per 3/25 los. Left voicemail regarding appt changes made to previously scheduled appts.

## 2019-05-10 NOTE — Patient Instructions (Signed)

## 2019-05-11 ENCOUNTER — Encounter: Payer: Self-pay | Admitting: Oncology

## 2019-05-11 ENCOUNTER — Inpatient Hospital Stay: Payer: 59

## 2019-05-11 ENCOUNTER — Inpatient Hospital Stay: Payer: 59 | Admitting: Oncology

## 2019-05-11 ENCOUNTER — Telehealth: Payer: Self-pay | Admitting: Oncology

## 2019-05-11 ENCOUNTER — Other Ambulatory Visit: Payer: Self-pay | Admitting: Oncology

## 2019-05-11 NOTE — Telephone Encounter (Signed)
Called and spoke with patient per 3/26 sch msg. Cancelled 4/1 appt per her request

## 2019-05-14 ENCOUNTER — Other Ambulatory Visit: Payer: Self-pay | Admitting: Oncology

## 2019-05-14 NOTE — Progress Notes (Unsigned)
Received today the note from Dr. Theora Gianotti at [facility] who saw Kathryn Ramsey on [phone].  She comments that she might be eligible for the [facility] trial at [facility] and the team will contact her.  She is looking at other sites and notes that the patient is interested in WEE1 inhibitors.

## 2019-05-15 ENCOUNTER — Other Ambulatory Visit: Payer: Self-pay | Admitting: Oncology

## 2019-05-15 ENCOUNTER — Telehealth: Payer: Self-pay

## 2019-05-15 ENCOUNTER — Inpatient Hospital Stay: Payer: 59

## 2019-05-15 ENCOUNTER — Encounter: Payer: Self-pay | Admitting: Oncology

## 2019-05-15 ENCOUNTER — Other Ambulatory Visit: Payer: Self-pay | Admitting: *Deleted

## 2019-05-15 ENCOUNTER — Other Ambulatory Visit: Payer: Self-pay

## 2019-05-15 VITALS — BP 126/72 | HR 72 | Temp 98.1°F | Resp 16

## 2019-05-15 DIAGNOSIS — R110 Nausea: Secondary | ICD-10-CM

## 2019-05-15 DIAGNOSIS — Z5111 Encounter for antineoplastic chemotherapy: Secondary | ICD-10-CM | POA: Diagnosis not present

## 2019-05-15 DIAGNOSIS — D701 Agranulocytosis secondary to cancer chemotherapy: Secondary | ICD-10-CM

## 2019-05-15 DIAGNOSIS — C762 Malignant neoplasm of abdomen: Secondary | ICD-10-CM

## 2019-05-15 DIAGNOSIS — C786 Secondary malignant neoplasm of retroperitoneum and peritoneum: Secondary | ICD-10-CM

## 2019-05-15 DIAGNOSIS — C5701 Malignant neoplasm of right fallopian tube: Secondary | ICD-10-CM

## 2019-05-15 DIAGNOSIS — T451X5A Adverse effect of antineoplastic and immunosuppressive drugs, initial encounter: Secondary | ICD-10-CM

## 2019-05-15 DIAGNOSIS — G620 Drug-induced polyneuropathy: Secondary | ICD-10-CM

## 2019-05-15 LAB — CMP (CANCER CENTER ONLY)
ALT: 11 U/L (ref 0–44)
AST: 15 U/L (ref 15–41)
Albumin: 3.2 g/dL — ABNORMAL LOW (ref 3.5–5.0)
Alkaline Phosphatase: 153 U/L — ABNORMAL HIGH (ref 38–126)
Anion gap: 9 (ref 5–15)
BUN: 15 mg/dL (ref 6–20)
CO2: 25 mmol/L (ref 22–32)
Calcium: 8.9 mg/dL (ref 8.9–10.3)
Chloride: 99 mmol/L (ref 98–111)
Creatinine: 0.88 mg/dL (ref 0.44–1.00)
GFR, Est AFR Am: >60 mL/min (ref >60)
GFR, Estimated: >60 mL/min (ref >60)
Glucose, Bld: 131 mg/dL — ABNORMAL HIGH (ref 70–99)
Potassium: 3.8 mmol/L (ref 3.5–5.1)
Sodium: 133 mmol/L — ABNORMAL LOW (ref 135–145)
Total Bilirubin: <0.2 mg/dL — ABNORMAL LOW (ref 0.3–1.2)
Total Protein: 7.1 g/dL (ref 6.5–8.1)

## 2019-05-15 LAB — CBC WITH DIFFERENTIAL (CANCER CENTER ONLY)
Abs Immature Granulocytes: 0.28 10*3/uL — ABNORMAL HIGH (ref 0.00–0.07)
Basophils Absolute: 0.1 10*3/uL (ref 0.0–0.1)
Basophils Relative: 0 %
Eosinophils Absolute: 0 10*3/uL (ref 0.0–0.5)
Eosinophils Relative: 0 %
HCT: 30.3 % — ABNORMAL LOW (ref 36.0–46.0)
Hemoglobin: 9.5 g/dL — ABNORMAL LOW (ref 12.0–15.0)
Immature Granulocytes: 2 %
Lymphocytes Relative: 6 %
Lymphs Abs: 1 10*3/uL (ref 0.7–4.0)
MCH: 28.8 pg (ref 26.0–34.0)
MCHC: 31.4 g/dL (ref 30.0–36.0)
MCV: 91.8 fL (ref 80.0–100.0)
Monocytes Absolute: 0.5 10*3/uL (ref 0.1–1.0)
Monocytes Relative: 3 %
Neutro Abs: 16 10*3/uL — ABNORMAL HIGH (ref 1.7–7.7)
Neutrophils Relative %: 89 %
Platelet Count: 367 10*3/uL (ref 150–400)
RBC: 3.3 MIL/uL — ABNORMAL LOW (ref 3.87–5.11)
RDW: 17.9 % — ABNORMAL HIGH (ref 11.5–15.5)
WBC Count: 17.9 10*3/uL — ABNORMAL HIGH (ref 4.0–10.5)
nRBC: 0 % (ref 0.0–0.2)

## 2019-05-15 MED ORDER — SODIUM CHLORIDE 0.9% FLUSH
10.0000 mL | INTRAVENOUS | Status: DC | PRN
  Administered 2019-05-15: 10 mL via INTRAVENOUS
  Filled 2019-05-15: qty 10

## 2019-05-15 MED ORDER — SCOPOLAMINE 1 MG/3DAYS TD PT72: 1.0000 | MEDICATED_PATCH | TRANSDERMAL | 0 refills | Status: DC

## 2019-05-15 MED ORDER — METOCLOPRAMIDE HCL 5 MG/ML IJ SOLN
10.0000 mg | Freq: Once | INTRAMUSCULAR | Status: AC
  Administered 2019-05-15: 10 mg via INTRAVENOUS

## 2019-05-15 MED ORDER — ACETAMINOPHEN 325 MG PO TABS
650.0000 mg | ORAL_TABLET | Freq: Once | ORAL | Status: AC
  Administered 2019-05-15: 650 mg via ORAL

## 2019-05-15 MED ORDER — ACETAMINOPHEN 325 MG PO TABS
ORAL_TABLET | ORAL | Status: AC
  Filled 2019-05-15: qty 2

## 2019-05-15 MED ORDER — METOCLOPRAMIDE HCL 5 MG PO TABS: 10.0000 mg | ORAL_TABLET | Freq: Three times a day (TID) | ORAL | 2 refills | Status: DC | PRN

## 2019-05-15 MED ORDER — HEPARIN SOD (PORK) LOCK FLUSH 100 UNIT/ML IV SOLN
500.0000 [IU] | Freq: Once | INTRAVENOUS | Status: AC
  Administered 2019-05-15: 500 [IU] via INTRAVENOUS
  Filled 2019-05-15: qty 5

## 2019-05-15 MED ORDER — PROMETHAZINE HCL 25 MG/ML IJ SOLN
25.0000 mg | Freq: Once | INTRAMUSCULAR | Status: AC
  Administered 2019-05-15: 12.5 mg via INTRAVENOUS

## 2019-05-15 MED ORDER — IBUPROFEN 200 MG PO TABS
200.0000 mg | ORAL_TABLET | Freq: Once | ORAL | Status: AC
  Administered 2019-05-15: 200 mg via ORAL

## 2019-05-15 MED ORDER — IBUPROFEN 200 MG PO TABS
ORAL_TABLET | ORAL | Status: AC
  Filled 2019-05-15: qty 1

## 2019-05-15 MED ORDER — METOCLOPRAMIDE HCL 5 MG/ML IJ SOLN
INTRAMUSCULAR | Status: AC
  Filled 2019-05-15: qty 2

## 2019-05-15 MED ORDER — PROMETHAZINE HCL 25 MG/ML IJ SOLN
INTRAMUSCULAR | Status: AC
  Filled 2019-05-15: qty 1

## 2019-05-15 MED ORDER — PROMETHAZINE HCL 25 MG PO TABS: 25.0000 mg | ORAL_TABLET | Freq: Four times a day (QID) | ORAL | 0 refills | Status: DC | PRN

## 2019-05-15 MED ORDER — SODIUM CHLORIDE 0.9 % IV SOLN
INTRAVENOUS | Status: AC
  Filled 2019-05-15 (×2): qty 250

## 2019-05-15 NOTE — Progress Notes (Signed)
Kathryn Ramsey called today to report that she has had quite a bit of vomiting the last few days and has become dehydrated.  She was also very constipated, likely secondary to the Zofran she has been using for nausea.  She did have a bowel movement today with the help of milk of magnesia, which she takes as a pill.  Her labs today are not bad.  I think the symptoms she is having are due to to the chemotherapy.  I think she will benefit from intravenous fluids daily for the next 2 days and these are being set up.

## 2019-05-15 NOTE — Patient Instructions (Signed)

## 2019-05-15 NOTE — Telephone Encounter (Signed)
Called Mrs. Sooy to let her know that she was scheduled for IV fluids for tomorrow at 3 pm and 4/1 at 3 pm per scheduling message.  Left her a voicemail on her cell phone.  Gardiner Rhyme, RN

## 2019-05-15 NOTE — Progress Notes (Signed)
Patient c/o nausea when she arrived for treatment today. Marlon Pel, RN notified. Orders entered for promethazine 25 mg IV. This RN noted patient's allergy to prochlorperazine prior to promethazine administration. Patient denies ever having an anaphylactic reaction and feels she has had phenergan in the past with no issues. Promethazine 12.5 mg diluted with 10 mL and given slow IVP. After receiving approximately 2 mL (2.5 mg over 6 mins), patient began c/o "feeling funny in my throat." Immediately stopped administering promethazine. Patient's symptoms quickly resolved to baseline with no further issues.

## 2019-05-16 ENCOUNTER — Telehealth: Payer: Self-pay | Admitting: *Deleted

## 2019-05-16 ENCOUNTER — Encounter: Payer: Self-pay | Admitting: *Deleted

## 2019-05-16 ENCOUNTER — Other Ambulatory Visit: Payer: Self-pay

## 2019-05-16 ENCOUNTER — Inpatient Hospital Stay: Payer: 59

## 2019-05-16 VITALS — BP 110/69 | HR 76 | Temp 98.0°F | Resp 18

## 2019-05-16 DIAGNOSIS — C762 Malignant neoplasm of abdomen: Secondary | ICD-10-CM

## 2019-05-16 DIAGNOSIS — Z5111 Encounter for antineoplastic chemotherapy: Secondary | ICD-10-CM | POA: Diagnosis not present

## 2019-05-16 DIAGNOSIS — Z95828 Presence of other vascular implants and grafts: Secondary | ICD-10-CM

## 2019-05-16 DIAGNOSIS — D701 Agranulocytosis secondary to cancer chemotherapy: Secondary | ICD-10-CM

## 2019-05-16 DIAGNOSIS — T451X5A Adverse effect of antineoplastic and immunosuppressive drugs, initial encounter: Secondary | ICD-10-CM

## 2019-05-16 DIAGNOSIS — D702 Other drug-induced agranulocytosis: Secondary | ICD-10-CM

## 2019-05-16 LAB — CA 125: Cancer Antigen (CA) 125: 8076 U/mL — ABNORMAL HIGH (ref 0.0–38.1)

## 2019-05-16 MED ORDER — HEPARIN SOD (PORK) LOCK FLUSH 100 UNIT/ML IV SOLN
500.0000 [IU] | Freq: Once | INTRAVENOUS | Status: AC | PRN
  Administered 2019-05-16: 500 [IU]
  Filled 2019-05-16: qty 5

## 2019-05-16 MED ORDER — SODIUM CHLORIDE 0.9% FLUSH
10.0000 mL | INTRAVENOUS | Status: DC | PRN
  Administered 2019-05-16: 10 mL
  Filled 2019-05-16: qty 10

## 2019-05-16 MED ORDER — SODIUM CHLORIDE 0.9 % IV SOLN
INTRAVENOUS | Status: DC
  Filled 2019-05-16 (×2): qty 250

## 2019-05-16 NOTE — Telephone Encounter (Signed)
This RN spoke with pt this am- she states she overall " feels stronger " post receiving the IV fluids yesterday.  Her primary issue is the continued abd discomfort " like everything is stuck there "  She states she has not picked up the new prescriptions sent yesterday.  This RN discussed the reglan and scopalamine prescriptions and how we feel they may be beneficial.  She is picking them up this am and will start.  Of note pt states she was unaware that she was to use decadron post chemo last week and there fore she did not take it.  In addition to above this RN informed pt of call from Dr Gershon Crane ( [place] ) who rquested additional Her 2 testing on original pathology.  This RN contacted pathology and requested the above.  Records also sent per [facility] email to MD Ouida Sills.  Kathryn Ramsey is aware of appointment today for additional IVF.  No other needs at this time.

## 2019-05-16 NOTE — Patient Instructions (Signed)

## 2019-05-16 NOTE — Progress Notes (Signed)
This RN faxed request to Tedra Coupe [phone]) for CT imaging disk from 04/23/19 be sent to MD Ouida Sills with Attention to Charlynne Cousins, RN at [address], [phone], [place] [address].

## 2019-05-17 ENCOUNTER — Inpatient Hospital Stay: Payer: 59 | Admitting: Oncology

## 2019-05-17 ENCOUNTER — Encounter: Payer: Self-pay | Admitting: Oncology

## 2019-05-17 ENCOUNTER — Inpatient Hospital Stay: Payer: 59

## 2019-05-17 ENCOUNTER — Telehealth: Payer: Self-pay | Admitting: *Deleted

## 2019-05-17 NOTE — Telephone Encounter (Signed)
Received message from Laurence Compton that pt had cancelled infusion appt today.  Message left for pt to call to let us know how she is doing.

## 2019-05-18 ENCOUNTER — Other Ambulatory Visit: Payer: Self-pay | Admitting: Oncology

## 2019-05-22 ENCOUNTER — Ambulatory Visit: Payer: 59 | Admitting: Physical Therapy

## 2019-05-23 ENCOUNTER — Ambulatory Visit: Payer: 59 | Attending: Gynecologic Oncology | Admitting: Physical Therapy

## 2019-05-23 ENCOUNTER — Other Ambulatory Visit: Payer: Self-pay

## 2019-05-23 ENCOUNTER — Encounter: Payer: Self-pay | Admitting: Oncology

## 2019-05-23 ENCOUNTER — Encounter: Payer: Self-pay | Admitting: Physical Therapy

## 2019-05-23 ENCOUNTER — Other Ambulatory Visit: Payer: Self-pay | Admitting: Oncology

## 2019-05-23 DIAGNOSIS — C762 Malignant neoplasm of abdomen: Secondary | ICD-10-CM | POA: Diagnosis present

## 2019-05-23 DIAGNOSIS — M62838 Other muscle spasm: Secondary | ICD-10-CM | POA: Insufficient documentation

## 2019-05-23 DIAGNOSIS — R109 Unspecified abdominal pain: Secondary | ICD-10-CM | POA: Insufficient documentation

## 2019-05-23 NOTE — Therapy (Signed)
[facility] [address], [place], [place], [address] Phone: [phone]   Fax:  [phone]  Physical Therapy Evaluation  Patient Details  Name: Kathryn Ramsey MRN: [id number] Date of Birth: [birth year] Referring Provider (PT): Dr. Lurline Del   Encounter Date: 05/23/2019  PT End of Session - 05/23/19 1318    Visit Number  1    Date for PT Re-Evaluation  08/15/19    Authorization Type  [employer]    Authorization - Visit Number  1    Authorization - Number of Visits  23    PT Start Time  1230    PT Stop Time  1310    PT Time Calculation (min)  40 min    Activity Tolerance  Patient tolerated treatment well    Behavior During Therapy  [facility] for tasks assessed/performed       Past Medical History:  Diagnosis Date  . Acute sensory neuropathy 06/26/2015  . Allergy   . Anemia   . Asthma    illness induced asthma  . Blood transfusion without reported diagnosis    at age 15 years old D/T surgery to femur being crushed  . 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 Heart murmur    pt, states had a "working heart murmur"  . History of kidney stones   . Neutropenia, drug-induced ([place]) 03/10/2018  . PONV (postoperative nausea and vomiting)   . Skin cancer     Past Surgical History:  Procedure Laterality Date  . 3 laporscopic proceedures    . ABDOMINAL HYSTERECTOMY     2010  . CHOLECYSTECTOMY     2010  . DILATION AND CURETTAGE OF UTERUS     1997  . IR IMAGING GUIDED PORT INSERTION  03/08/2018  . LAPAROSCOPY N/A 03/27/2015   Procedure: DIAGNOSTIC LAPAROSCOPY ;  Surgeon: [name], MD;  Location: [facility];  Service: Gynecology;  Laterality: N/A;  . LAPAROSCOPY N/A 02/24/2016   Procedure: LAPAROSCOPY DIAGNOSTIC WITH PERITONEAL WASHINGS AND PERITONEAL BIOPSY;  Surgeon: [name], MD;  Location: [facility];  Service: Gynecology;  Laterality: N/A;  . LAPAROTOMY N/A  03/27/2015   Procedure: EXPLORATORY LAPAROTOMY, BILATERAL SALPINGO OOPHORECTOMY, OMENTECTOMY, RADICAL TUMOR DEBULKING;  Surgeon: [name], MD;  Location: [facility];  Service: Gynecology;  Laterality: N/A;  . sinus surgery 1994      There were no vitals filed for this visit.   Subjective Assessment - 05/23/19 1239    Subjective  My intestines are sticking together. I have increased abdominal fluid. I am vomiting alot.    Patient Stated Goals  improve mobility of the intestines    Currently in Pain?  Yes    Pain Score  3    back pain is 3/10   Pain Location  Abdomen    Pain Orientation  Mid    Pain Descriptors / Indicators  Discomfort    Pain Type  Chronic pain    Pain Onset  More than a month ago    Pain Frequency  Constant    Aggravating Factors   swelloing of the abdomen    Pain Relieving Factors  not sure         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 Sarajane Jews Magrinat    Onset Date/Surgical Date  --   long term   Prior Therapy  yes      Precautions   Precautions  Other (comment)    Precaution Comments  current malignancy      Restrictions   Weight Bearing Restrictions  No      Balance Screen   Has the patient fallen in the past 6 months  No    Has the patient had a decrease in activity level because of a fear of falling?   No    Is the patient reluctant to leave their home because of a fear of falling?   No      Home Film/video editor residence      Prior Function   Level of Independence  Independent      Cognition   Overall Cognitive Status  Within Functional Limits for tasks assessed      Observation/Other Assessments-Edema    Edema  Circumferential      Circumferential Edema   Circumferential - Right  prior to treamtent  at umbilicus is 81 cm and after 81 cm      Posture/Postural Control   Posture/Postural Control  Postural limitations    Posture Comments  distended abdomen      Palpation   Palpation comment  decreased mobility of the left rib cage for bucket handle movement; tightness in the abdomen. Tightness in the diaphgram; unable to perform abdominal breath                Objective measurements completed on examination: See above findings.      [facility] Adult PT Treatment/Exercise - 05/23/19 0001      Manual Therapy   Manual Therapy  Soft tissue mobilization;Myofascial release    Soft tissue mobilization  circular motion of the abdomen to improve peristalic motion of the intestines    Myofascial Release  myofascial release of the lower abdomen along her scar to improve restrictions of the intestines, release of the upper quadrant to go through the fascial planes               PT Short Term Goals - 05/23/19 1401      PT SHORT TERM GOAL #1   Title  able to reports pain level decreased by 1 level after treatment due to improved visceral mobility    Time  4    Period  Weeks    Status  New    Target Date  06/20/19      PT SHORT TERM GOAL #2   Title  understand correct toileting technique to assist stool to come out with relaxation of the pelvic floor    Time  4    Period  Weeks    Status  New    Target Date  06/20/19      PT SHORT TERM GOAL #3   Title  ----      PT SHORT TERM GOAL #4   Title  -----      PT SHORT TERM GOAL #5   Title  ----        PT Long Term Goals - 05/23/19 1404      PT LONG TERM GOAL #1   Title  understand how to perfrom lower abdominal fascial work to reduce the intestines from sticking together due to her cancer    Baseline  -----    Time  12  Period  Weeks    Status  New    Target Date  08/15/19      PT LONG TERM GOAL #2   Title  pain level after treatment decreased by 2 pain levels due to improved visceral mobility    Baseline   ----    Time  12    Period  Weeks    Status  New    Target Date  08/15/19      PT LONG TERM GOAL #3   Title  abdominal circumference reduced >/= 1 cm after treatment due to improved visceral mobility    Baseline  ---    Time  12    Period  Weeks    Status  New    Target Date  08/15/19      PT LONG TERM GOAL #4   Title  --------    Baseline  ------             Plan - 05/23/19 1409    Clinical Impression Statement  Patient is a 54 year old female with Carcinomatosis peritonei, abdominal carcinomatosis; and cancer of right fallopian tube. Patient is presently having chemotherapy. Patient abdomen has expanded to 81 cm. Patient cancer makes her intestines and organs stick together in the abdomen. Patient reports she is vomiting more at the end of the day and could be due to the extension of her abdomen or the progression of her cancer. Patient has decreased mobility of the diaphragm and lower rib cage, left tighter than the right. Patient back and abdomen pain is 3/10. Patient will benefit from skilled therapy to improve abdominal visceral and intestinal mobility to prevent constipation and reduce her pain for comfort.    Personal Factors and Comorbidities  Comorbidity 3+;Age    Comorbidities  abdominal carcinomatosis; cancer of right fallopian tube; carcinomatosis peritonei; currently on chemotherapy; abdominal hysterectomy    Examination-Activity Limitations  Sleep;Bend;Locomotion Level    Examination-Participation Restrictions  Community Activity;Interpersonal Relationship    Stability/Clinical Decision Making  Evolving/Moderate complexity    Clinical Decision Making  Moderate    Rehab Potential  Good    PT Frequency  1x / week    PT Duration  12 weeks    PT Treatment/Interventions  Moist Heat;Therapeutic activities;Therapeutic exercise;Neuromuscular re-education;Manual techniques;Patient/family education    PT Next Visit Plan  visceral mobilization; mobilization of the diaphragm;  rib cage mobilization;    Consulted and Agree with Plan of Care  Patient       Patient will benefit from skilled therapeutic intervention in order to improve the following deficits and impairments:  Pain, Increased fascial restricitons, Decreased activity tolerance  Visit Diagnosis: Abdominal pain, unspecified abdominal location - Plan: PT plan of care cert/re-cert  Muscle spasm - Plan: PT plan of care cert/re-cert  Abdominal carcinomatosis ([place]) - Plan: PT plan of care cert/re-cert     Problem List Patient Active Problem List   Diagnosis Date Noted  . Neuropathy due to chemotherapeutic drug ([place]) 07/24/2018  . Anemia due to antineoplastic chemotherapy 05/22/2018  . Port-A-Cath in place 05/01/2018  . Neutropenia, drug-induced ([place]) 03/10/2018  . Diarrhea 08/02/2017  . Lichen sclerosus et atrophicus of the vulva 03/31/2016  . Abdominal wall mass of right lower quadrant 03/08/2016  . Carcinomatosis peritonei ([place]) 02/28/2016  . Goals of care, counseling/discussion 02/27/2016  . Elevated CA-125 02/24/2016  . Vaginal dryness, menopausal 10/01/2015  . Drug-induced peripheral neuropathy ([place]) 09/02/2015  . Leukopenia due to antineoplastic chemotherapy ([place]) 09/02/2015  . Chemotherapy-induced  neuropathy ([place]) 08/19/2015  . Cellulitis 07/03/2015  . Acute sensory neuropathy 06/26/2015  . High risk medication use 06/21/2015  . Facial paresthesia 06/18/2015  . Genetic testing 06/16/2015  . Restless legs 05/14/2015  . Family history of breast cancer in female 04/29/2015  . Chemotherapy-induced nausea 04/22/2015  . Chemotherapy induced neutropenia ([place]) 04/22/2015  . 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 History of hysterectomy for benign disease 03/21/2015  . IBS (irritable bowel syndrome) 03/21/2015  . Dense breast tissue 03/21/2015  . History of cholecystectomy 03/21/2015  . Abdominal carcinomatosis ([place]) 03/21/2015    Earlie Counts, PT 05/23/19 2:31 PM   [facility] [address], [place], [place], [address] Phone: [phone]   Fax:  [phone]  Name: Jenelle Malen MRN: [id number] Date of Birth: May 28, 1965

## 2019-05-24 ENCOUNTER — Other Ambulatory Visit: Payer: Self-pay | Admitting: Oncology

## 2019-05-24 ENCOUNTER — Ambulatory Visit: Payer: 59

## 2019-05-24 ENCOUNTER — Other Ambulatory Visit: Payer: Self-pay | Admitting: *Deleted

## 2019-05-24 DIAGNOSIS — R112 Nausea with vomiting, unspecified: Secondary | ICD-10-CM

## 2019-05-24 DIAGNOSIS — C762 Malignant neoplasm of abdomen: Secondary | ICD-10-CM

## 2019-05-24 DIAGNOSIS — C786 Secondary malignant neoplasm of retroperitoneum and peritoneum: Secondary | ICD-10-CM

## 2019-05-24 MED ORDER — PROMETHAZINE HCL 25 MG RE SUPP: 25.0000 mg | Freq: Four times a day (QID) | RECTAL | 0 refills | Status: DC | PRN

## 2019-05-24 MED ORDER — SODIUM CHLORIDE 0.9 % IV SOLN
INTRAVENOUS | Status: AC
  Filled 2019-05-24: qty 250

## 2019-05-24 NOTE — Progress Notes (Signed)
Stanton Kidney had a thoracentesis at [facility] I believe yesterday and she is having paracentesis today.  She has been having nausea and difficulty swallowing.  It does not sound like she is obstructed from her account.  I have offered her some fluids today and revisit in fluids tomorrow morning and have also called in Phenergan suppositories which I hope will be helpful.

## 2019-05-24 NOTE — Progress Notes (Unsigned)
N

## 2019-05-25 ENCOUNTER — Other Ambulatory Visit: Payer: Self-pay | Admitting: Oncology

## 2019-05-25 DIAGNOSIS — R1319 Other dysphagia: Secondary | ICD-10-CM

## 2019-05-25 DIAGNOSIS — R1310 Dysphagia, unspecified: Secondary | ICD-10-CM

## 2019-05-25 DIAGNOSIS — C786 Secondary malignant neoplasm of retroperitoneum and peritoneum: Secondary | ICD-10-CM

## 2019-05-25 NOTE — Progress Notes (Signed)
Kathryn Ramsey has had significant swallowing difficulties which I cannot easily explain.  I am putting her in for a barium swallow.

## 2019-05-28 ENCOUNTER — Encounter: Payer: Self-pay | Admitting: Oncology

## 2019-05-28 ENCOUNTER — Other Ambulatory Visit: Payer: Self-pay | Admitting: Oncology

## 2019-05-28 DIAGNOSIS — T451X5A Adverse effect of antineoplastic and immunosuppressive drugs, initial encounter: Secondary | ICD-10-CM

## 2019-05-28 DIAGNOSIS — C786 Secondary malignant neoplasm of retroperitoneum and peritoneum: Secondary | ICD-10-CM

## 2019-05-28 DIAGNOSIS — D6481 Anemia due to antineoplastic chemotherapy: Secondary | ICD-10-CM

## 2019-05-29 ENCOUNTER — Other Ambulatory Visit: Payer: Self-pay | Admitting: Oncology

## 2019-05-29 ENCOUNTER — Inpatient Hospital Stay: Payer: 59 | Attending: Oncology

## 2019-05-29 ENCOUNTER — Inpatient Hospital Stay: Payer: 59

## 2019-05-29 ENCOUNTER — Other Ambulatory Visit: Payer: Self-pay

## 2019-05-29 ENCOUNTER — Ambulatory Visit (HOSPITAL_COMMUNITY)
Admission: RE | Admit: 2019-05-29 | Discharge: 2019-05-29 | Disposition: A | Discharge status: Home / Self Care | Payer: 59 | Source: Ambulatory Visit | Attending: Oncology | Admitting: Oncology

## 2019-05-29 DIAGNOSIS — C5701 Malignant neoplasm of right fallopian tube: Secondary | ICD-10-CM | POA: Diagnosis present

## 2019-05-29 DIAGNOSIS — Z5111 Encounter for antineoplastic chemotherapy: Secondary | ICD-10-CM | POA: Insufficient documentation

## 2019-05-29 DIAGNOSIS — Z7952 Long term (current) use of systemic steroids: Secondary | ICD-10-CM | POA: Insufficient documentation

## 2019-05-29 DIAGNOSIS — Z803 Family history of malignant neoplasm of breast: Secondary | ICD-10-CM | POA: Insufficient documentation

## 2019-05-29 DIAGNOSIS — C786 Secondary malignant neoplasm of retroperitoneum and peritoneum: Secondary | ICD-10-CM

## 2019-05-29 DIAGNOSIS — R1310 Dysphagia, unspecified: Secondary | ICD-10-CM | POA: Diagnosis not present

## 2019-05-29 DIAGNOSIS — R5383 Other fatigue: Secondary | ICD-10-CM | POA: Insufficient documentation

## 2019-05-29 DIAGNOSIS — R1319 Other dysphagia: Secondary | ICD-10-CM

## 2019-05-29 DIAGNOSIS — K5900 Constipation, unspecified: Secondary | ICD-10-CM | POA: Insufficient documentation

## 2019-05-29 DIAGNOSIS — Z87442 Personal history of urinary calculi: Secondary | ICD-10-CM | POA: Diagnosis not present

## 2019-05-29 DIAGNOSIS — Z888 Allergy status to other drugs, medicaments and biological substances status: Secondary | ICD-10-CM | POA: Insufficient documentation

## 2019-05-29 DIAGNOSIS — D6481 Anemia due to antineoplastic chemotherapy: Secondary | ICD-10-CM

## 2019-05-29 DIAGNOSIS — Z823 Family history of stroke: Secondary | ICD-10-CM | POA: Diagnosis not present

## 2019-05-29 DIAGNOSIS — Z842 Family history of other diseases of the genitourinary system: Secondary | ICD-10-CM | POA: Insufficient documentation

## 2019-05-29 DIAGNOSIS — Z90722 Acquired absence of ovaries, bilateral: Secondary | ICD-10-CM | POA: Insufficient documentation

## 2019-05-29 DIAGNOSIS — Z807 Family history of other malignant neoplasms of lymphoid, hematopoietic and related tissues: Secondary | ICD-10-CM | POA: Insufficient documentation

## 2019-05-29 DIAGNOSIS — Z8051 Family history of malignant neoplasm of kidney: Secondary | ICD-10-CM | POA: Diagnosis not present

## 2019-05-29 DIAGNOSIS — Z8249 Family history of ischemic heart disease and other diseases of the circulatory system: Secondary | ICD-10-CM | POA: Diagnosis not present

## 2019-05-29 DIAGNOSIS — Z9079 Acquired absence of other genital organ(s): Secondary | ICD-10-CM | POA: Insufficient documentation

## 2019-05-29 DIAGNOSIS — C801 Malignant (primary) neoplasm, unspecified: Secondary | ICD-10-CM | POA: Diagnosis present

## 2019-05-29 DIAGNOSIS — R188 Other ascites: Secondary | ICD-10-CM | POA: Diagnosis not present

## 2019-05-29 DIAGNOSIS — Z5189 Encounter for other specified aftercare: Secondary | ICD-10-CM | POA: Insufficient documentation

## 2019-05-29 DIAGNOSIS — Z882 Allergy status to sulfonamides status: Secondary | ICD-10-CM | POA: Insufficient documentation

## 2019-05-29 DIAGNOSIS — D701 Agranulocytosis secondary to cancer chemotherapy: Secondary | ICD-10-CM | POA: Insufficient documentation

## 2019-05-29 DIAGNOSIS — G620 Drug-induced polyneuropathy: Secondary | ICD-10-CM | POA: Insufficient documentation

## 2019-05-29 DIAGNOSIS — Z170 Estrogen receptor positive status [ER+]: Secondary | ICD-10-CM | POA: Insufficient documentation

## 2019-05-29 DIAGNOSIS — J90 Pleural effusion, not elsewhere classified: Secondary | ICD-10-CM | POA: Insufficient documentation

## 2019-05-29 DIAGNOSIS — Z9221 Personal history of antineoplastic chemotherapy: Secondary | ICD-10-CM | POA: Insufficient documentation

## 2019-05-29 DIAGNOSIS — Z79899 Other long term (current) drug therapy: Secondary | ICD-10-CM | POA: Insufficient documentation

## 2019-05-29 DIAGNOSIS — C762 Malignant neoplasm of abdomen: Secondary | ICD-10-CM

## 2019-05-29 DIAGNOSIS — Z808 Family history of malignant neoplasm of other organs or systems: Secondary | ICD-10-CM | POA: Insufficient documentation

## 2019-05-29 LAB — CBC WITH DIFFERENTIAL/PLATELET
Abs Immature Granulocytes: 0.02 10*3/uL (ref 0.00–0.07)
Basophils Absolute: 0 10*3/uL (ref 0.0–0.1)
Basophils Relative: 0 %
Eosinophils Absolute: 0 10*3/uL (ref 0.0–0.5)
Eosinophils Relative: 0 %
HCT: 28 % — ABNORMAL LOW (ref 36.0–46.0)
Hemoglobin: 8.9 g/dL — ABNORMAL LOW (ref 12.0–15.0)
Immature Granulocytes: 1 %
Lymphocytes Relative: 38 %
Lymphs Abs: 0.6 10*3/uL — ABNORMAL LOW (ref 0.7–4.0)
MCH: 28.9 pg (ref 26.0–34.0)
MCHC: 31.8 g/dL (ref 30.0–36.0)
MCV: 90.9 fL (ref 80.0–100.0)
Monocytes Absolute: 0.1 10*3/uL (ref 0.1–1.0)
Monocytes Relative: 8 %
Neutro Abs: 0.9 10*3/uL — ABNORMAL LOW (ref 1.7–7.7)
Neutrophils Relative %: 53 %
Platelets: 41 10*3/uL — ABNORMAL LOW (ref 150–400)
RBC: 3.08 MIL/uL — ABNORMAL LOW (ref 3.87–5.11)
RDW: 15.7 % — ABNORMAL HIGH (ref 11.5–15.5)
WBC: 1.7 10*3/uL — ABNORMAL LOW (ref 4.0–10.5)
nRBC: 0 % (ref 0.0–0.2)

## 2019-05-29 LAB — PREPARE RBC (CROSSMATCH)

## 2019-05-29 MED ORDER — ACETAMINOPHEN 325 MG PO TABS
650.0000 mg | ORAL_TABLET | Freq: Once | ORAL | Status: AC
  Administered 2019-05-29: 14:00:00 650 mg via ORAL

## 2019-05-29 MED ORDER — HEPARIN SOD (PORK) LOCK FLUSH 100 UNIT/ML IV SOLN
500.0000 [IU] | Freq: Every day | INTRAVENOUS | Status: AC | PRN
  Administered 2019-05-29: 18:00:00 500 [IU]
  Filled 2019-05-29: qty 5

## 2019-05-29 MED ORDER — DIPHENHYDRAMINE HCL 25 MG PO CAPS
ORAL_CAPSULE | ORAL | Status: AC
  Filled 2019-05-29: qty 1

## 2019-05-29 MED ORDER — SODIUM CHLORIDE 0.9% FLUSH
10.0000 mL | INTRAVENOUS | Status: AC | PRN
  Administered 2019-05-29: 18:00:00 10 mL
  Filled 2019-05-29: qty 10

## 2019-05-29 MED ORDER — ACETAMINOPHEN 325 MG PO TABS
ORAL_TABLET | ORAL | Status: AC
  Filled 2019-05-29: qty 2

## 2019-05-29 MED ORDER — DIPHENHYDRAMINE HCL 25 MG PO CAPS: 25.0000 mg | ORAL_CAPSULE | Freq: Once | ORAL | Status: DC

## 2019-05-29 MED ORDER — SODIUM CHLORIDE 0.9% IV SOLUTION
250.0000 mL | Freq: Once | INTRAVENOUS | Status: AC
  Administered 2019-05-29: 14:00:00 250 mL via INTRAVENOUS
  Filled 2019-05-29: qty 250

## 2019-05-29 NOTE — Patient Instructions (Signed)
https://www.[employer].org/donate-blood/blood-donation-process/what-happens-to-donated-blood/blood-transfusions/types-of-blood-transfusions.html"> https://www.[employer].org/donate-blood/blood-donation-process/what-happens-to-donated-blood/blood-transfusions/risks-complications.html">  Blood Transfusion, Adult, Care After This sheet gives you information about how to care for yourself after your procedure. Your health care provider may also give you more specific instructions. If you have problems or questions, contact your health care provider. What can I expect after the procedure? After the procedure, it is common to have:  Bruising and soreness where the IV was inserted.  A fever or chills on the day of the procedure. This may be your body's response to the new blood cells received.  A headache. Follow these instructions at home: IV insertion site care      Follow instructions from your health care provider about how to take care of your IV insertion site. Make sure you: ? Wash your hands with soap and water before and after you change your bandage (dressing). If soap and water are not available, use hand sanitizer. ? Change your dressing as told by your health care provider.  Check your IV insertion site every day for signs of infection. Check for: ? Redness, swelling, or pain. ? Bleeding from the site. ? Warmth. ? Pus or a bad smell. General instructions  Take over-the-counter and prescription medicines only as told by your health care provider.  Rest as told by your health care provider.  Return to your normal activities as told by your health care provider.  Keep all follow-up visits as told by your health care provider. This is important. Contact a health care provider if:  You have itching or red, swollen areas of skin (hives).  You feel anxious.  You feel weak after doing your normal activities.  You have redness, swelling, warmth, or pain around the IV  insertion site.  You have blood coming from the IV insertion site that does not stop with pressure.  You have pus or a bad smell coming from your IV insertion site. Get help right away if:  You have symptoms of a serious allergic or immune system reaction, including: ? Trouble breathing or shortness of breath. ? Swelling of the face or feeling flushed. ? Fever or chills. ? Pain in the head, back, or chest. ? Dark urine or blood in the urine. ? Widespread rash. ? Fast heartbeat. ? Feeling dizzy or light-headed. If you receive your blood transfusion in an outpatient setting, you will be told whom to contact to report any reactions. These symptoms may represent a serious problem that is an emergency. Do not wait to see if the symptoms will go away. Get medical help right away. Call your local emergency services (911 in the [place] Do not drive yourself to the hospital. Summary  Bruising and tenderness around the IV insertion site are common.  Check your IV insertion site every day for signs of infection.  Rest as told by your health care provider. Return to your normal activities as told by your health care provider.  Get help right away for symptoms of a serious allergic or immune system reaction to blood transfusion. This information is not intended to replace advice given to you by your health care provider. Make sure you discuss any questions you have with your health care provider. Document Revised: 07/27/2018 Document Reviewed: 07/27/2018 Elsevier Patient Education  2020 [employer]  

## 2019-05-30 ENCOUNTER — Other Ambulatory Visit: Payer: 59

## 2019-05-30 LAB — TYPE AND SCREEN
ABO/RH(D): A POS
Antibody Screen: NEGATIVE
Unit division: 0
Unit division: 0

## 2019-05-30 LAB — BPAM RBC
Blood Product Expiration Date: 202104202359
Blood Product Expiration Date: 202104222359
ISSUE DATE / TIME: 202104131050
ISSUE DATE / TIME: 202104131050
Unit Type and Rh: 6200
Unit Type and Rh: 6200

## 2019-05-31 ENCOUNTER — Other Ambulatory Visit: Payer: Self-pay | Admitting: Oncology

## 2019-05-31 ENCOUNTER — Encounter: Payer: Self-pay | Admitting: Oncology

## 2019-06-01 NOTE — Progress Notes (Signed)
Pharmacist Chemotherapy Monitoring - Follow Up Assessment    I verify that I have reviewed each item in the below checklist:  . Regimen for the patient is scheduled for the appropriate day and plan matches scheduled date. [name] Appropriate non-routine labs are ordered dependent on drug ordered. . If applicable, additional medications reviewed and ordered per protocol based on lifetime cumulative doses and/or treatment regimen.   Plan for follow-up and/or issues identified: No . I-vent associated with next due treatment: No . MD and/or nursing notified: No  Philomena Course 06/01/2019 2:20 PM

## 2019-06-04 ENCOUNTER — Other Ambulatory Visit: Payer: Self-pay | Admitting: Oncology

## 2019-06-04 NOTE — Progress Notes (Unsigned)
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  I have requested both immunohistochemistry and FISH on the sample from 03/27/2015, accession number [id number]

## 2019-06-06 ENCOUNTER — Ambulatory Visit: Payer: 59 | Admitting: Physical Therapy

## 2019-06-06 NOTE — Progress Notes (Signed)
[place]  [phone] Fax:([phone]) [phone]    ID: Kathryn Ramsey DOB: 06-Oct-1965  MR#: [id number]  EYC#:[id number]  Patient Care Team: Chauncey Cruel, MD as PCP - General (Oncology) [name], MD as Consulting Physician (Obstetrics and Gynecology) Juanita Craver, MD as Consulting Physician (Gastroenterology) Servando Salina, MD as Consulting Physician (Obstetrics and Gynecology) Nickie Retort, MD as Consulting Physician (Urology) Gillis Ends, MD as Referring Physician (Obstetrics and Gynecology) Mettu, Baltazar Apo, MD as Referring Physician (Oncology) OTHER MD: Festus Aloe [phone]); Floyde Parkins; Gerhard Perches Matulonis [phone])  NOTE: Patient does not want to be informed of her CA-125.  CHIEF COMPLAINT:  ovarian cancer  CURRENT TREATMENT: Liposomal doxorubicin, carboplatin   INTERVAL HISTORY: [name] returns today for follow-up and treatment of her recurrent ovarian cancer  She began liposomal doxorubicin today, along with Carboplatin, on 05/10/2019.  This was very rough for her.  She did not take adequate nausea medication and she became very anemic.  She did benefit from the blood transfusion that she received before her trip to [place].  She was very appreciative of the attention she received there from Dr. Terrace Arabia.   Echocardiogram on 05/09/2019 noted a well preserved EF of 55-60%.  She is due for second dose today.  She has been working with Dr. Theora Gianotti at [facility] and Dr. Gerhard Perches Matulonis at [facility] and also has correspondence with an ovarian cancer specialist at [facility]..  Dr. Terrace Arabia does not feel she would qualify for the phase 2 trials available because of the number of prior treatments.  She could undergo a phase 1 clinical trial if available and one of the research nurses will be in touch with Shali if and when she wishes to pursue a clinical trial.  She also thought adding octreotide might help  mitigate some of the symptoms of her chronic small bowel obstruction.  I do not have experience using octreotide for that indication and actually am not comfortable with that at least at this point.  The patient also would be hesitant to use that she tells me.  Otherwise Dr. Terrace Arabia thought continuing carboplatin/liposomal doxorubicin was sensible and thought that perhaps this could be done every 3 weeks if tolerated.  Anmol's most recent left thoracentesis was 05/21/2019.  She tolerated that well   REVIEW OF SYSTEMS: Mikesha feels recovered and ready to go back to chemo.  She denies unusual headaches visual changes cough phlegm production or pleurisy.  Overall a detailed review of systems was stable.  She is concerned about her nutrition and wonders if some time she may need total parenteral nutrition.  HISTORY OF PRESENT ILLNESS: From Dr. Serita Grit original intake note 03/17/2015:  "Kathryn Ramsey is a very pleasant G4P4 who is seen in consultation at the request of Dr Collene Mares and Dr Garwin Brothers for peritoneal carcinomatosis. The patient has a history of a workup by urology for gross hematuria which included a pelvic examine was suggested for extrinsic mass effect on the bladder on cystoscopy. Cystoscopy took place on in December 2016. The patient denies abdominal pain bloating early satiety or abdominal distention.  On 08/03/2015 at [facility] she underwent a CT scan of the abdomen and pelvis as ordered by Dr Baruch Gouty. This revealed a 1.4 cm low attenuation lesion on the posterior right hepatic lobe suggestive of a hemangioma. Status post hysterectomy. No ovarian masses. Moderate ascites. Omental caking seen in the lateral left abdomen and pelvis. Peritoneal nodularity in the left lateral pelvic cul-de-sac. No  gross extrinsic compression on the bladder was identified on imaging.  The patient was then seen by her gastroenterologist, Dr. Collene Mares, who performed a colonoscopy which was  unremarkable.  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 She was treated adjuvantly with carboplatin and paclitaxel, as detailed below. Her CA 125 dropped from 1226 on 03/20/2015 to 26.1 by 06/16/2015.  Her subsequent history is as detailed above.   PAST MEDICAL HISTORY: Past Medical History:  Diagnosis Date  . Acute sensory neuropathy 06/26/2015  . Allergy   . Anemia   . Asthma    illness induced asthma  . Blood transfusion without reported diagnosis    at age 1 years old D/T surgery to femur being crushed  . 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 Heart murmur    pt, states had a "working heart murmur"  . History of kidney stones   . Neutropenia, drug-induced (Louisa) 03/10/2018  . PONV (postoperative nausea and vomiting)   . Skin cancer     PAST SURGICAL HISTORY: Past Surgical History:  Procedure Laterality Date  . 3 laporscopic proceedures    . ABDOMINAL HYSTERECTOMY     2010  . CHOLECYSTECTOMY     2010  . DILATION AND CURETTAGE OF UTERUS     1997  . IR IMAGING GUIDED PORT INSERTION  03/08/2018  . LAPAROSCOPY N/A 03/27/2015   Procedure: DIAGNOSTIC LAPAROSCOPY ;  Surgeon: [name], MD;  Location: [facility];  Service: Gynecology;  Laterality: N/A;  . LAPAROSCOPY N/A 02/24/2016   Procedure: LAPAROSCOPY DIAGNOSTIC WITH PERITONEAL WASHINGS AND PERITONEAL BIOPSY;  Surgeon: [name], MD;  Location: [facility];  Service: Gynecology;  Laterality: N/A;  . LAPAROTOMY N/A 03/27/2015   Procedure: EXPLORATORY LAPAROTOMY, BILATERAL SALPINGO OOPHORECTOMY, OMENTECTOMY, RADICAL TUMOR  DEBULKING;  Surgeon: [name], MD;  Location: [facility];  Service: Gynecology;  Laterality: N/A;  . sinus surgery 1994      FAMILY HISTORY Family History  Problem Relation Age of Onset  . Hypertension Father   . Skin cancer Father        nonmelanoma skin cancers in his late 13s  . Non-Hodgkin's lymphoma Maternal Aunt        dx. 29s; smoker  . Stroke Maternal Grandmother   . Other Mother        benign meningioma dx. early-mid-70s; hysterectomy in her late 44s for heavy periods - still has ovaries  . Other Son        one son with pre-cancerous skin findings  . Other Daughter        cysts on ovaries and hx of heavy periods  . Other Sister        hysterectomy for cysts - still has ovaries  . Heart attack Maternal Grandfather   . Heart attack Paternal Grandfather   . Renal cancer Maternal Aunt 60       smoker  . Breast cancer Maternal Aunt 78  . Other Maternal Aunt        dx. benign brain tumor (meningioma) at 27; 2nd benign brain tumor in her late 29s; hx of radical hysterectomy at  age 62  . Brain cancer Other        NOS tumor  The patient's parents are both living, in their late 68s as of January 2018. The patient has one brother, 3 sisters. There is no history of ovarian cancer in the family. One maternal aunt had breast and kidney cancer, another had non-Hodgkin's lymphoma.   GYNECOLOGIC HISTORY:  No LMP recorded. Patient has had a hysterectomy. 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 Her husband [name] is a Engineer, maintenance (IT). Their children are 88, 7, 67 and 31 y/o as of JAN 2018. The oldest works as an Electrical engineer in the [employer] in [place]. The other 3 children live in [place], the youngest currently attending [employer]. The patient has no grandchildren. She attends [employer]    ADVANCED DIRECTIVES: Runette has completed advanced directives and they are filed.  Her  husband [name] is her healthcare power of attorney.   HEALTH MAINTENANCE: Social History   Tobacco Use  . Smoking status: Never Smoker  . Smokeless tobacco: Never Used  Substance Use Topics  . Alcohol use: Yes    Comment:  3 glasses wine or beer/week  . Drug use: No    Colonoscopy:2016  PAP: s/p hyst  Bone density:   Allergies  Allergen Reactions  . Bee Venom Shortness Of Breath    swelling  . Prochlorperazine Edisylate Anaphylaxis, Anxiety and Palpitations    Anaphylaxis, Anxiety and Palpitations   . Sulfa Antibiotics Shortness Of Breath    Vomit , diarrhea , hives  . Phenergan [Promethazine Hcl] Other (See Comments)    "funny sensation in my throat"  . Zarxio [Filgrastim]     Current Outpatient Medications  Medication Sig Dispense Refill  . acetaminophen (TYLENOL) 500 MG tablet Take 2 tablets by mouth 3 (three) times daily as needed.    [name] albuterol (VENTOLIN HFA) 108 (90 Base) MCG/ACT inhaler Inhale into the lungs.    [name] dexamethasone (DECADRON) 4 MG tablet Take 2 tablets by mouth once a day for 3 days after chemo. Take with food. 30 tablet 1  . dicyclomine (BENTYL) 10 MG capsule Take 1 capsule (10 mg total) by mouth 4 (four) times daily -  before meals and at bedtime. 120 capsule 0  . diphenhydrAMINE (BENADRYL) 50 MG capsule 50 mg Diphenhydramine (Benadryl) PO within 1 hour of the injection 1 capsule 0  . fluticasone (FLONASE) 50 MCG/ACT nasal spray Place 1 spray into both nostrils 2 (two) times daily. (Patient taking differently: Place 1 spray into both nostrils as needed. ) 16 g 2  . gabapentin (NEURONTIN) 100 MG capsule 200 mg bid with 300 mg qhs 630 capsule 1  . ibuprofen (ADVIL) 600 MG tablet Take 1 tablet by mouth 4 (four) times daily as needed.    . lidocaine-prilocaine (EMLA) cream APPLY TOPICALLY AS NEEDED. 30 g 0  . loratadine (CLARITIN) 10 MG tablet Take 10 mg by mouth as needed.     [name] LORazepam (ATIVAN) 0.5 MG tablet Take 1 tablet (0.5 mg total) by mouth at  bedtime as needed (Nausea or vomiting). 20 tablet 0  . Magnesium (V-R MAGNESIUM) 250 MG TABS Take by mouth.    . magnesium oxide (MAG-OX) 400 (241.3 Mg) MG tablet Take 1 tablet (400 mg total) by mouth daily. 60 tablet 3  . metoCLOPramide (REGLAN) 5 MG tablet Take 2 tablets (10  mg total) by mouth every 8 (eight) hours as needed for nausea. 30 tablet 2  . Multiple Vitamins-Minerals (MULTIVITAMIN ADULT PO) Take 1 tablet by mouth daily.    . Omega 3 1000 MG CAPS Take 1 capsule by mouth daily.    [name] omeprazole (PRILOSEC) 40 MG capsule Take 1 capsule (40 mg total) by mouth daily. 30 capsule 5  . prochlorperazine (COMPAZINE) 5 MG tablet Take 1 tablet (5 mg total) by mouth every 8 (eight) hours as needed for nausea or vomiting. 30 tablet 6  . promethazine (PHENERGAN) 25 MG suppository Place 1 suppository (25 mg total) rectally every 6 (six) hours as needed for nausea or vomiting. 12 each 0  . scopolamine (TRANSDERM-SCOP) 1 MG/3DAYS Place 1 patch (1.5 mg total) onto the skin every 3 (three) days. 4 patch 0  . senna-docusate (SENOKOT-S) 8.6-50 MG tablet Take 1-2 tablets by mouth 2 (two) times daily as needed.    . sertraline (ZOLOFT) 50 MG tablet Take 1.5 tablets (75 mg total) by mouth daily. Increase dose to 75 mg daily 45 tablet 3  . traMADol (ULTRAM) 50 MG tablet Take 1 tablet (50 mg total) by mouth every 6 (six) hours as needed. 60 tablet 0  . valACYclovir (VALTREX) 1000 MG tablet Take 1 tablet (1,000 mg total) by mouth daily. 5 tablet 0  . VENTOLIN HFA 108 (90 Base) MCG/ACT inhaler     . vitamin B-12 (CYANOCOBALAMIN) 1000 MCG tablet Take 1 tablet by mouth daily.     No current facility-administered medications for this visit.   Facility-Administered Medications Ordered in Other Visits  Medication Dose Route Frequency Provider Last Rate Last Admin  . heparin lock flush 100 unit/mL  500 Units Intravenous Once Shelden Raborn, Virgie Dad, MD      . sodium chloride flush (NS) 0.9 % injection 10 mL  10 mL  Intravenous PRN Blima Jaimes, Virgie Dad, MD        OBJECTIVE: White woman in no acute distress Vitals:   06/07/19 1215  BP: 111/83  Pulse: 89  Resp: 20  Temp: 98.2 F (36.8 C)  SpO2: 100%     Body mass index is 18.28 kg/m.   Filed Weights   06/07/19 1215  Weight: 103 lb 3.2 oz (46.8 kg)    ECOG FS:1 - Symptomatic but completely ambulatory  Sclerae unicteric, EOMs intact Wearing a mask No cervical or supraclavicular adenopathy Lungs no rales or rhonchi Heart regular rate and rhythm Abd soft, nontender, positive bowel sounds MSK no focal spinal tenderness, no upper extremity lymphedema Neuro: nonfocal, well oriented, appropriate affect Breasts: Deferred   LAB RESULTS:  CMP     Component Value Date/Time   NA 133 (L) 05/15/2019 1430   NA 140 02/11/2017 0755   K 3.8 05/15/2019 1430   K 4.4 02/11/2017 0755   CL 99 05/15/2019 1430   CO2 25 05/15/2019 1430   CO2 26 02/11/2017 0755   GLUCOSE 131 (H) 05/15/2019 1430   GLUCOSE 93 02/11/2017 0755   BUN 15 05/15/2019 1430   BUN 7.9 02/11/2017 0755   CREATININE 0.88 05/15/2019 1430   CREATININE 0.7 02/11/2017 0755   CALCIUM 8.9 05/15/2019 1430   CALCIUM 9.6 02/11/2017 0755   PROT 7.1 05/15/2019 1430   PROT 7.5 02/11/2017 0755   ALBUMIN 3.2 (L) 05/15/2019 1430   ALBUMIN 4.4 02/11/2017 0755   AST 15 05/15/2019 1430   AST 27 02/11/2017 0755   ALT 11 05/15/2019 1430   ALT 24 02/11/2017 0755  ALKPHOS 153 (H) 05/15/2019 1430   ALKPHOS 58 02/11/2017 0755   BILITOT <0.2 (L) 05/15/2019 1430   BILITOT 0.43 02/11/2017 0755   GFRNONAA >60 05/15/2019 1430   GFRAA >60 05/15/2019 1430    INo results found for: SPEP, UPEP  Lab Results  Component Value Date   WBC 4.3 06/07/2019   NEUTROABS 2.8 06/07/2019   HGB 10.9 (L) 06/07/2019   HCT 33.6 (L) 06/07/2019   MCV 89.1 06/07/2019   PLT 324 06/07/2019      Chemistry      Component Value Date/Time   NA 133 (L) 05/15/2019 1430   NA 140 02/11/2017 0755   K 3.8 05/15/2019  1430   K 4.4 02/11/2017 0755   CL 99 05/15/2019 1430   CO2 25 05/15/2019 1430   CO2 26 02/11/2017 0755   BUN 15 05/15/2019 1430   BUN 7.9 02/11/2017 0755   CREATININE 0.88 05/15/2019 1430   CREATININE 0.7 02/11/2017 0755      Component Value Date/Time   CALCIUM 8.9 05/15/2019 1430   CALCIUM 9.6 02/11/2017 0755   ALKPHOS 153 (H) 05/15/2019 1430   ALKPHOS 58 02/11/2017 0755   AST 15 05/15/2019 1430   AST 27 02/11/2017 0755   ALT 11 05/15/2019 1430   ALT 24 02/11/2017 0755   BILITOT <0.2 (L) 05/15/2019 1430   BILITOT 0.43 02/11/2017 0755      No results found for: LABCA2  No components found for: LABCA125  No results for input(s): INR in the last 168 hours.  Urinalysis    Component Value Date/Time   COLORURINE STRAW (A) 11/22/2017 0940   APPEARANCEUR CLEAR 11/22/2017 0940   LABSPEC 1.002 (L) 11/22/2017 0940   LABSPEC 1.005 10/09/2015 1239   PHURINE 7.0 11/22/2017 0940   GLUCOSEU NEGATIVE 11/22/2017 0940   GLUCOSEU Negative 10/09/2015 1239   HGBUR NEGATIVE 11/22/2017 0940   BILIRUBINUR NEGATIVE 11/22/2017 0940   BILIRUBINUR Negative 10/09/2015 1239   KETONESUR NEGATIVE 11/22/2017 0940   PROTEINUR NEGATIVE 04/03/2019 1050   UROBILINOGEN 0.2 10/09/2015 1239   NITRITE NEGATIVE 11/22/2017 0940   LEUKOCYTESUR NEGATIVE 11/22/2017 0940   LEUKOCYTESUR Negative 10/09/2015 1239    STUDIES: DG UGI W DOUBLE CM (HD BA)  Result Date: 05/29/2019 CLINICAL DATA:  Ovarian cancer diagnosed 4.5 years ago. Vomiting 1 time a day for 2 weeks. Feeling of lump in throat. EXAM: UPPER GI SERIES WITHOUT KUB TECHNIQUE: Routine upper GI series was performed with thin and high density barium. FLUOROSCOPY TIME:  Fluoroscopy Time:  5 minutes and 40 seconds Radiation Exposure Index (if provided by the fluoroscopic device): Not applicable. Number of Acquired Spot Images: 1 COMPARISON:  CT of 04/23/2019 FINDINGS: Double contrast evaluation of the esophagus and stomach are minimally limited, as patient  had coughing spells and near emesis during administration of double contrast agent. Given this limitation, no mucosal abnormality identified. Double contrast evaluation of the stomach is also mildly degraded. No dominant mass or ulcer seen. No evidence of gastric outlet obstruction. The duodenal bulb is normal. Single-contrast evaluation of the hypopharynx and esophagus are unremarkable, without persistent narrowing or stricture. There is moderate dilatation of the duodenal C loop. Delayed image demonstrates contrast within the proximal jejunum. IMPRESSION: 1. Mild limitations involving the double contrast portion of the exam as detailed above. 2. No evidence of hypopharyngeal or esophageal abnormality to explain feeling of lump in throat or dysphagia. 3. No gastric outlet obstruction. Duodenal C-loop dilatation likely related to partial small bowel obstruction, when compared to  04/23/2019 CT. Electronically Signed   By: Abigail Miyamoto M.D.   On: 05/29/2019 11:02   ECHOCARDIOGRAM COMPLETE  Result Date: 05/09/2019    ECHOCARDIOGRAM REPORT   Patient Name:   BELLAMI FARRELLY Date of Exam: 05/09/2019 Medical Rec #:  [id number]  Height:       63.0 in Accession #:    [id number] Weight:       115.1 lb Date of Birth:  [birth year]  BSA:          1.529 m Patient Age:    59 years   BP:           107/78 mmHg Patient Gender: F          HR:           73 bpm. Exam Location:  Outpatient Procedure: 2D Echo Indications:    v67.2 chemo  History:        Patient has prior history of Echocardiogram examinations, most                 recent 03/05/2016. Cancer.  Sonographer:    Jannett Celestine RDCS (AE) Referring Phys: [place]  1. Left ventricular ejection fraction, by estimation, is 55 to 60%. The left ventricle has normal function. The left ventricle has no regional wall motion abnormalities. Left ventricular diastolic parameters are consistent with Grade II diastolic dysfunction (pseudonormalization). The average left  ventricular global longitudinal strain is -16.8 %.  2. Right ventricular systolic function is normal. The right ventricular size is normal. There is normal pulmonary artery systolic pressure.  3. Large pleural effusion in the left lateral region.  4. The mitral valve is normal in structure. No evidence of mitral valve regurgitation. No evidence of mitral stenosis.  5. The aortic valve is normal in structure. Aortic valve regurgitation is not visualized. No aortic stenosis is present.  6. The inferior vena cava is normal in size with greater than 50% respiratory variability, suggesting right atrial pressure of 3 mmHg. FINDINGS  Left Ventricle: Left ventricular ejection fraction, by estimation, is 55 to 60%. The left ventricle has normal function. The left ventricle has no regional wall motion abnormalities. The average left ventricular global longitudinal strain is -16.8 %. The left ventricular internal cavity size was normal in size. There is no left ventricular hypertrophy. Left ventricular diastolic parameters are consistent with Grade II diastolic dysfunction (pseudonormalization). Normal left ventricular filling pressure. Right Ventricle: The right ventricular size is normal. No increase in right ventricular wall thickness. Right ventricular systolic function is normal. There is normal pulmonary artery systolic pressure. The tricuspid regurgitant velocity is 1.57 m/s, and  with an assumed right atrial pressure of 3 mmHg, the estimated right ventricular systolic pressure is 33.3 mmHg. Left Atrium: Left atrial size was normal in size. Right Atrium: Right atrial size was normal in size. Pericardium: There is no evidence of pericardial effusion. Mitral Valve: The mitral valve is normal in structure. Normal mobility of the mitral valve leaflets. No evidence of mitral valve regurgitation. No evidence of mitral valve stenosis. Tricuspid Valve: The tricuspid valve is normal in structure. Tricuspid valve regurgitation is  trivial. No evidence of tricuspid stenosis. Aortic Valve: The aortic valve is normal in structure. Aortic valve regurgitation is not visualized. No aortic stenosis is present. Pulmonic Valve: The pulmonic valve was normal in structure. Pulmonic valve regurgitation is not visualized. No evidence of pulmonic stenosis. Aorta: The aortic root is normal in size and structure. Venous: The inferior vena  cava is normal in size with greater than 50% respiratory variability, suggesting right atrial pressure of 3 mmHg. IAS/Shunts: No atrial level shunt detected by color flow Doppler. Additional Comments: There is a large pleural effusion in the left lateral region.  LEFT VENTRICLE PLAX 2D LVIDd:         4.20 cm  Diastology LVIDs:         2.90 cm  LV e' lateral:   7.07 cm/s LV PW:         0.60 cm  LV E/e' lateral: 6.9 LV IVS:        0.70 cm  LV e' medial:    6.20 cm/s LVOT diam:     1.70 cm  LV E/e' medial:  7.9 LV SV:         33 LV SV Index:   21       2D Longitudinal Strain LVOT Area:     2.27 cm 2D Strain GLS (A2C):   -18.7 %                         2D Strain GLS (A3C):   -15.9 %                         2D Strain GLS (A4C):   -15.8 %                         2D Strain GLS Avg:     -16.8 % RIGHT VENTRICLE RV S prime:     11.20 cm/s TAPSE (M-mode): 1.4 cm LEFT ATRIUM             Index       RIGHT ATRIUM           Index LA diam:        2.80 cm 1.83 cm/m  RA Area:     10.90 cm LA Vol (A2C):   22.7 ml 14.85 ml/m RA Volume:   22.00 ml  14.39 ml/m LA Vol (A4C):   15.8 ml 10.33 ml/m LA Biplane Vol: 19.8 ml 12.95 ml/m  AORTIC VALVE LVOT Vmax:   79.00 cm/s LVOT Vmean:  54.000 cm/s LVOT VTI:    0.144 m  AORTA Ao Root diam: 2.70 cm MITRAL VALVE               TRICUSPID VALVE MV Area (PHT): 3.89 cm    TR Peak grad:   9.9 mmHg MV Decel Time: 195 msec    TR Vmax:        157.00 cm/s MV E velocity: 48.80 cm/s MV A velocity: 44.60 cm/s  SHUNTS MV E/A ratio:  1.09        Systemic VTI:  0.14 m                            Systemic Diam:  1.70 cm Skeet Latch MD Electronically signed by Skeet Latch MD Signature Date/Time: 05/09/2019/1:10:47 PM    Final      ELIGIBLE FOR AVAILABLE RESEARCH PROTOCOL:  no  ASSESSMENT: 54 y.o. BRCA negative [place] woman status post radical tumor debulking with optimal cytoreduction (R0) 03/27/2015 for a stage IIIC, high-grade right fallopian tube carcinoma  (a) baseline CA-125 was 1226.  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 (p.Lys449Lys)  (c) tumor is PD-L1 negative (02/24/2016)  (d) tumor is strongly estrogen receptor positive, progesterone receptor negative (02/24/2016)  (1) adjuvant chemotherapy consisted of carboplatin and paclitaxel for 6 doses, begun 04/14/2015, completed 08/11/2015  (a) paclitaxel was omitted from cycle 5 and dose reduced on cycle 6 because of neuropathy   (b) a "make up" dose of paclitaxel was given 08/25/2015  (c) last carboplatin dose was 08/11/2015  (d) CA-125 normalized by 06/16/2015   (2) FIRST RECURRENCE: January 2018  (a) CA-125 rise beginning November 2017 led to CT scans and PET scans December 2017, all negative  (b) exploratory laparotomy 02/24/2016 showed pathologically confirmed recurrence, with miliary disease involving all examined surfaces  (c) port-site metastases noted 03/08/2016  (d) the January 2018 tumor sample was tested for the estrogen receptor and he was 80 percent positive with strong staining, progesterone receptor negative  (3) carboplatin/liposomal doxorubicin started 03/05/2016, repeated every three weeks x4, completed 06/04/2016.  (a) cycle 3 delayed one week because of neutropenia; OnPro added  (b) CA 125 normalized after cycle 3  (4) RUCAPARIB maintenance started 07/02/2016  (a) restaging 10/01/2016: Normal CA 125, negative CT of the abdomen and pelvis  (b) labs  11/24/2016 shows a rise in her CA 125-43.4, with continuing rise thereafter  (c) rucaparib discontinued October 2018 with rising CA 125  (5) anastrozole started December 23, 2016-stopped after a couple of weeks with poor tolerance  SECOND RECURRENCE: (6) Gemcitabine/Bevacizumab started on 02/04/2017  (a) gemcitabine omitted cycle 4 because of intercurrent infection  (b) gemcitabine resumed with cycle 5, with intervening rise in  Ca 125  (c) repeat CT scan of the abdomen and pelvis on 05/26/2017 does not confirm obvious disease progression  (d) cycle 5 of gemcitabine/bevacizumab delayed 1 week   (e) gemcitabine/ bevacizumab discontinued after 6 cycles, with rising CA 125 (last dose 06/24/2017)  (7) foundation one testing did not show a high mutation burden and the microsatellite status was stable.  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  These were not immediately actionable  (8) Abraxane started 07/12/2017, given weekly or weeks 1 and 2 of each 3 weeks cycle, last dose 11/15/2017 (5 cycles)  (a) cycle 2 started 08/09/2017, will be day 1 day 8 only  (b) cycle 3 scheduled to start 09/13/2017, day 1 day 8 at patient request  (c) CT of the abdomen and pelvis 11/28/2017 shows no measurable disease  (d) PET scan 01/25/2018 and MRI of the pelvis 02/03/2018 show no measurable disease  (e) patient has symptomatic ascites and a rapidly rising Ca1 25  (9) started cisplatin/gemcitabine days 1 and 8 of each 21-day cycle on 02/18/2018  (a) switched to carboplatin/ gemcitabine 07/17/2018 due to neuropathy  (b) carbo/gemzar changed to Q14 days 07/24/2018 due to neutropenia  (c) considered niraparib/Zejula 100 mg, 2 tabs daily at the completion of chemotherapy  (10) associated problems  (a) anemia due to chemotherapy: On Aranesp  (b) 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 10/25/18 Consenting for AMV-564 clinical trial 21. 11/13/18 AMV-564 initiation 75 mcg dose 22. 10/8 Dropped down to 50 mcg dose (I think that date is correct) 23. 10/19-21/20 Tx held due to Neutropenia 24. 12/06/18 Given Neulasta 25. 12/07/18 AMV-564 resumed 26. 01/03/19 CT C/A/P 10% RECIST progression 27. 01/15/19 Increased dose to 75 mcg/daily 28. 01/17/19 Thoracentesis with  cytology c/w [place]er[place] primary  29. Paracentesis 02/05/2019: cytology c/w adenocarcinoma  (12) schedule of thoracenteses:  (a) left 01/17/2019 [facility])  (b) left 02/06/2019 [facility])  (c) 05/21/2019 on the left [facility])  (13) schedule of paracenteses:  (a) 01/30/2019 ([place])  (b) 02/06/2019 ([place])  (c) 02/18/2018 ([place])  (d) 04/17/2019 ([place])  (14) abraxane started 02/20/2019, repeated days 1 and 8 of every 21 day cycle  (a) bevacizumab/Avastin started 02/20/2019 and repeated every 21 days  (b) She cannot tolerate Zarxio due to side effects consisting of low grade fever, body aches, malaise, fatigue, and itching, whereas she has been able to tolerate Granix though  (c) bevacizumab and Abraxane discontinued after 04/10/2019 dose with evidence of progression  (15) exemestane started 04/24/2019, discontinued after 2 weeks as patient opted for chemotherapy  (16) started liposomal doxorubicin/carboplatin 05/10/2019, repeated every 28 days  (a) echocardiogram 05/08/2017 shows an ejection fraction in the 55-60% range.  (b) patient is interested in [place] if available through the "right to try" program  (c) HER-2 determination requested on prior pathology 06/04/2019  (d) considering [facility] study   PLAN: Christyna has recovered well from her first cycle of the current chemotherapy and we are ready to treat again today.  We are intensifying the antiemetics.  She will take dexamethasone 8 mg twice daily beginning on day 2 and continuing through day 4.  She will also use scopolamine.  Then she will take  Compazine 5 mg 3 times a day as needed.  We are avoiding Zofran because of constipation issues.  She also has Phenergan suppositories if she cannot take things p.o.  She will receive fluids on days 4 and 6.  I am also setting her up for a blood transfusion on day 10.  Of course we will check her hemoglobin prior to transfusing  We will try to get her treated on the third week, which would be 06[birth year]29/2019 (06/28/2021 is her birthday).  If we cannot treat her that day we will go back to the every 4-week schedule which we had originally and she is scheduled to be treated 07/05/2019 in that case  She will continue to keep her ears open and work through Drs at [employer], [employer] and [employer] in case a promising study opens for her.  She knows to call for any other issue that may develop before her next visit here.  Total encounter time 35 minutes.Sarajane Jews C. Honest Safranek, MD 06/07/19 12:50 PM Medical Oncology and Hematology [facility] [place], [place] [address] Tel. [phone]    Fax. [phone]   I, Wilburn Mylar, am acting as scribe for Dr. Virgie Dad. Virl Coble.  I, Lurline Del MD, have reviewed the above documentation for accuracy and completeness, and I agree with the above.    *Total Encounter Time as defined by the Centers for Medicare and Medicaid Services includes, in addition to the face-to-face time of a patient visit (documented in the note above) non-face-to-face time: obtaining and reviewing outside history, ordering and reviewing medications, tests or procedures, care coordination (communications with other health care professionals or caregivers) and documentation in the medical record.

## 2019-06-07 ENCOUNTER — Inpatient Hospital Stay: Payer: 59

## 2019-06-07 ENCOUNTER — Inpatient Hospital Stay (HOSPITAL_BASED_OUTPATIENT_CLINIC_OR_DEPARTMENT_OTHER): Payer: 59 | Admitting: Oncology

## 2019-06-07 ENCOUNTER — Other Ambulatory Visit: Payer: Self-pay

## 2019-06-07 ENCOUNTER — Other Ambulatory Visit: Payer: 59

## 2019-06-07 ENCOUNTER — Telehealth: Payer: Self-pay | Admitting: Oncology

## 2019-06-07 ENCOUNTER — Ambulatory Visit: Payer: 59

## 2019-06-07 VITALS — BP 111/83 | HR 89 | Temp 98.2°F | Resp 20 | Ht 63.0 in | Wt 103.2 lb

## 2019-06-07 DIAGNOSIS — C762 Malignant neoplasm of abdomen: Secondary | ICD-10-CM

## 2019-06-07 DIAGNOSIS — C801 Malignant (primary) neoplasm, unspecified: Secondary | ICD-10-CM | POA: Diagnosis not present

## 2019-06-07 DIAGNOSIS — C786 Secondary malignant neoplasm of retroperitoneum and peritoneum: Secondary | ICD-10-CM

## 2019-06-07 DIAGNOSIS — C5701 Malignant neoplasm of right fallopian tube: Secondary | ICD-10-CM

## 2019-06-07 DIAGNOSIS — D701 Agranulocytosis secondary to cancer chemotherapy: Secondary | ICD-10-CM

## 2019-06-07 DIAGNOSIS — Z7189 Other specified counseling: Secondary | ICD-10-CM

## 2019-06-07 DIAGNOSIS — Z5111 Encounter for antineoplastic chemotherapy: Secondary | ICD-10-CM | POA: Diagnosis not present

## 2019-06-07 DIAGNOSIS — G620 Drug-induced polyneuropathy: Secondary | ICD-10-CM

## 2019-06-07 LAB — CBC WITH DIFFERENTIAL/PLATELET
Abs Immature Granulocytes: 0.02 10*3/uL (ref 0.00–0.07)
Basophils Absolute: 0 10*3/uL (ref 0.0–0.1)
Basophils Relative: 0 %
Eosinophils Absolute: 0 10*3/uL (ref 0.0–0.5)
Eosinophils Relative: 0 %
HCT: 33.6 % — ABNORMAL LOW (ref 36.0–46.0)
Hemoglobin: 10.9 g/dL — ABNORMAL LOW (ref 12.0–15.0)
Immature Granulocytes: 1 %
Lymphocytes Relative: 21 %
Lymphs Abs: 0.9 10*3/uL (ref 0.7–4.0)
MCH: 28.9 pg (ref 26.0–34.0)
MCHC: 32.4 g/dL (ref 30.0–36.0)
MCV: 89.1 fL (ref 80.0–100.0)
Monocytes Absolute: 0.7 10*3/uL (ref 0.1–1.0)
Monocytes Relative: 15 %
Neutro Abs: 2.8 10*3/uL (ref 1.7–7.7)
Neutrophils Relative %: 63 %
Platelets: 324 10*3/uL (ref 150–400)
RBC: 3.77 MIL/uL — ABNORMAL LOW (ref 3.87–5.11)
RDW: 15.9 % — ABNORMAL HIGH (ref 11.5–15.5)
WBC: 4.3 10*3/uL (ref 4.0–10.5)
nRBC: 0 % (ref 0.0–0.2)

## 2019-06-07 LAB — CMP (CANCER CENTER ONLY)
ALT: 9 U/L (ref 0–44)
AST: 15 U/L (ref 15–41)
Albumin: 3.2 g/dL — ABNORMAL LOW (ref 3.5–5.0)
Alkaline Phosphatase: 104 U/L (ref 38–126)
Anion gap: 10 (ref 5–15)
BUN: 15 mg/dL (ref 6–20)
CO2: 25 mmol/L (ref 22–32)
Calcium: 9.2 mg/dL (ref 8.9–10.3)
Chloride: 101 mmol/L (ref 98–111)
Creatinine: 0.79 mg/dL (ref 0.44–1.00)
GFR, Est AFR Am: >60 mL/min (ref >60)
GFR, Estimated: >60 mL/min (ref >60)
Glucose, Bld: 95 mg/dL (ref 70–99)
Potassium: 4.2 mmol/L (ref 3.5–5.1)
Sodium: 136 mmol/L (ref 135–145)
Total Bilirubin: 0.3 mg/dL (ref 0.3–1.2)
Total Protein: 7.5 g/dL (ref 6.5–8.1)

## 2019-06-07 LAB — FERRITIN: Ferritin: 640 ng/mL — ABNORMAL HIGH (ref 11–307)

## 2019-06-07 MED ORDER — DOXORUBICIN HCL LIPOSOMAL CHEMO INJECTION 2 MG/ML
30.0000 mg/m2 | Freq: Once | INTRAVENOUS | Status: AC
  Administered 2019-06-07: 46 mg via INTRAVENOUS
  Filled 2019-06-07: qty 23

## 2019-06-07 MED ORDER — LORATADINE 10 MG PO TABS
10.0000 mg | ORAL_TABLET | Freq: Once | ORAL | Status: AC
  Administered 2019-06-07: 10 mg via ORAL

## 2019-06-07 MED ORDER — SODIUM CHLORIDE 0.9 % IV SOLN
10.0000 mg | Freq: Once | INTRAVENOUS | Status: AC
  Administered 2019-06-07: 10 mg via INTRAVENOUS
  Filled 2019-06-07: qty 10

## 2019-06-07 MED ORDER — PALONOSETRON HCL INJECTION 0.25 MG/5ML
0.2500 mg | Freq: Once | INTRAVENOUS | Status: AC
  Administered 2019-06-07: 0.25 mg via INTRAVENOUS

## 2019-06-07 MED ORDER — LORATADINE 10 MG PO TABS
ORAL_TABLET | ORAL | Status: AC
  Filled 2019-06-07: qty 1

## 2019-06-07 MED ORDER — PEGFILGRASTIM 6 MG/0.6ML ~~LOC~~ PSKT
6.0000 mg | PREFILLED_SYRINGE | Freq: Once | SUBCUTANEOUS | Status: AC
  Administered 2019-06-07: 6 mg via SUBCUTANEOUS

## 2019-06-07 MED ORDER — FAMOTIDINE IN NACL 20-0.9 MG/50ML-% IV SOLN
INTRAVENOUS | Status: AC
  Filled 2019-06-07: qty 50

## 2019-06-07 MED ORDER — DEXTROSE 5 % IV SOLN
Freq: Once | INTRAVENOUS | Status: AC
  Filled 2019-06-07: qty 250

## 2019-06-07 MED ORDER — SODIUM CHLORIDE 0.9% FLUSH
10.0000 mL | INTRAVENOUS | Status: DC | PRN
  Administered 2019-06-07: 10 mL
  Filled 2019-06-07: qty 10

## 2019-06-07 MED ORDER — PALONOSETRON HCL INJECTION 0.25 MG/5ML
INTRAVENOUS | Status: AC
  Filled 2019-06-07: qty 5

## 2019-06-07 MED ORDER — FAMOTIDINE IN NACL 20-0.9 MG/50ML-% IV SOLN
20.0000 mg | Freq: Once | INTRAVENOUS | Status: AC
  Administered 2019-06-07: 20 mg via INTRAVENOUS

## 2019-06-07 MED ORDER — PROCHLORPERAZINE MALEATE 5 MG PO TABS: 5.0000 mg | ORAL_TABLET | Freq: Three times a day (TID) | ORAL | 6 refills | Status: DC | PRN

## 2019-06-07 MED ORDER — SODIUM CHLORIDE 0.9 % IV SOLN
450.0000 mg | Freq: Once | INTRAVENOUS | Status: AC
  Administered 2019-06-07: 450 mg via INTRAVENOUS
  Filled 2019-06-07: qty 45

## 2019-06-07 MED ORDER — PEGFILGRASTIM 6 MG/0.6ML ~~LOC~~ PSKT
PREFILLED_SYRINGE | SUBCUTANEOUS | Status: AC
  Filled 2019-06-07: qty 0.6

## 2019-06-07 MED ORDER — SODIUM CHLORIDE 0.9 % IV SOLN
150.0000 mg | Freq: Once | INTRAVENOUS | Status: AC
  Administered 2019-06-07: 150 mg via INTRAVENOUS
  Filled 2019-06-07: qty 150

## 2019-06-07 MED ORDER — HEPARIN SOD (PORK) LOCK FLUSH 100 UNIT/ML IV SOLN
500.0000 [IU] | Freq: Once | INTRAVENOUS | Status: AC | PRN
  Administered 2019-06-07: 500 [IU]
  Filled 2019-06-07: qty 5

## 2019-06-07 NOTE — Addendum Note (Signed)
Addended by: Chauncey Cruel on: 06/07/2019 01:17 PM   Modules accepted: Orders

## 2019-06-07 NOTE — Patient Instructions (Signed)
[place] Discharge Instructions for Patients Receiving Chemotherapy  Today you received the following chemotherapy agents: Doxil/Carbo.  To help prevent nausea and vomiting after your treatment, we encourage you to take your nausea medication as directed.   If you develop nausea and vomiting that is not controlled by your nausea medication, call the clinic.   BELOW ARE SYMPTOMS THAT SHOULD BE REPORTED IMMEDIATELY:  *FEVER GREATER THAN 100.5 F  *CHILLS WITH OR WITHOUT FEVER  NAUSEA AND VOMITING THAT IS NOT CONTROLLED WITH YOUR NAUSEA MEDICATION  *UNUSUAL SHORTNESS OF BREATH  *UNUSUAL BRUISING OR BLEEDING  TENDERNESS IN MOUTH AND THROAT WITH OR WITHOUT PRESENCE OF ULCERS  *URINARY PROBLEMS  *BOWEL PROBLEMS  UNUSUAL RASH Items with * indicate a potential emergency and should be followed up as soon as possible.  Feel free to call the clinic should you have any questions or concerns. The clinic phone number is [phone].  Please show the [place] at check-in to the Emergency Department and triage nurse.

## 2019-06-07 NOTE — Addendum Note (Signed)
Addended by: Chauncey Cruel on: 06/07/2019 02:25 PM   Modules accepted: Orders

## 2019-06-07 NOTE — Telephone Encounter (Signed)
Scheduled appts per 4/22 los. Left voicemail with next appt details.

## 2019-06-07 NOTE — Progress Notes (Signed)
Confirmed Carboplatin dose increase to 450mg  today with MD.  Hardie Pulley, PharmD, BCPS, BCOP

## 2019-06-08 ENCOUNTER — Other Ambulatory Visit: Payer: Self-pay | Admitting: Oncology

## 2019-06-08 ENCOUNTER — Encounter: Payer: Self-pay | Admitting: Oncology

## 2019-06-08 LAB — CA 125: Cancer Antigen (CA) 125: 8644 U/mL — ABNORMAL HIGH (ref 0.0–38.1)

## 2019-06-08 NOTE — Progress Notes (Unsigned)
Patient: Kathryn Ramsey, Kathryn Ramsey Collected: 03/27/2015 Client: [facility] Accession: [id number] Received: 03/27/2015 [name], MD DOB: 30-Oct-1965 Age: 54 Gender: F Reported: 04/01/2015 [place] Patient Ph: [phone] MRN #: [id number] [place], [place] [address] Visit #: [id number].[id number] Chart #: Phone: Fax: CC: Melissa D. Cross, NP REPORT OF SURGICAL PATHOLOGY ADDITIONAL INFORMATION: 2. FLUORESCENCE IN-SITU HYBRIDIZATION Results: GROUP 5: HER2 **NEGATIVE** The HER2 gene was detected in the IHC (O) tissue sample received from this individual. HER2 FISH was performed by a technologist and cell imaging and analysis on the [employer]. RATIO OF HER2/CEN17 SIGNALS 1.35 AVERAGE HER2 COPY NUMBER PER CELL 1.75 The ratio of HER2/CEN 17 is within the range < 2.0 of HER2/CEN 17 and a copy number of HER2 signals per cell is <4.0. Arch Pathol Lab Med 1:1,2018 Vicente Males MD Pathologist, Electronic Signature ( Signed 06/06/2019) 2. By immunohistochemistry, the tumor cells are NEGTIVE for

## 2019-06-09 ENCOUNTER — Inpatient Hospital Stay: Payer: 59

## 2019-06-11 ENCOUNTER — Other Ambulatory Visit: Payer: Self-pay

## 2019-06-11 ENCOUNTER — Inpatient Hospital Stay: Payer: 59

## 2019-06-11 ENCOUNTER — Encounter: Payer: Self-pay | Admitting: Oncology

## 2019-06-11 VITALS — BP 117/72 | HR 67 | Temp 98.7°F | Resp 18 | Ht 63.0 in | Wt 100.6 lb

## 2019-06-11 DIAGNOSIS — D6481 Anemia due to antineoplastic chemotherapy: Secondary | ICD-10-CM

## 2019-06-11 DIAGNOSIS — Z95828 Presence of other vascular implants and grafts: Secondary | ICD-10-CM

## 2019-06-11 DIAGNOSIS — C786 Secondary malignant neoplasm of retroperitoneum and peritoneum: Secondary | ICD-10-CM

## 2019-06-11 DIAGNOSIS — Z5111 Encounter for antineoplastic chemotherapy: Secondary | ICD-10-CM | POA: Diagnosis not present

## 2019-06-11 DIAGNOSIS — C762 Malignant neoplasm of abdomen: Secondary | ICD-10-CM

## 2019-06-11 DIAGNOSIS — D702 Other drug-induced agranulocytosis: Secondary | ICD-10-CM

## 2019-06-11 DIAGNOSIS — D701 Agranulocytosis secondary to cancer chemotherapy: Secondary | ICD-10-CM

## 2019-06-11 DIAGNOSIS — T451X5A Adverse effect of antineoplastic and immunosuppressive drugs, initial encounter: Secondary | ICD-10-CM

## 2019-06-11 MED ORDER — PROCHLORPERAZINE MALEATE 10 MG PO TABS: 5.0000 mg | ORAL_TABLET | Freq: Once | ORAL | Status: DC | PRN

## 2019-06-11 MED ORDER — SODIUM CHLORIDE 0.9% FLUSH
3.0000 mL | INTRAVENOUS | Status: AC | PRN
  Administered 2019-06-11: 10 mL
  Filled 2019-06-11: qty 10

## 2019-06-11 MED ORDER — SODIUM CHLORIDE 0.45 % IV SOLN
Freq: Once | INTRAVENOUS | Status: AC
  Filled 2019-06-11: qty 1000

## 2019-06-11 MED ORDER — HEPARIN SOD (PORK) LOCK FLUSH 100 UNIT/ML IV SOLN
250.0000 [IU] | INTRAVENOUS | Status: AC | PRN
  Administered 2019-06-11: 500 [IU]
  Filled 2019-06-11: qty 5

## 2019-06-11 NOTE — Patient Instructions (Signed)

## 2019-06-11 NOTE — Progress Notes (Signed)
Pt received 1L .45% normal saline, tolerated well.  Declined antiemetic.  Able to eat/drink during tx w/out any issues, declined to use restroom.  VSS.

## 2019-06-13 ENCOUNTER — Other Ambulatory Visit: Payer: Self-pay

## 2019-06-13 ENCOUNTER — Encounter: Payer: Self-pay | Admitting: Physical Therapy

## 2019-06-13 ENCOUNTER — Ambulatory Visit: Payer: 59 | Admitting: Physical Therapy

## 2019-06-13 DIAGNOSIS — M62838 Other muscle spasm: Secondary | ICD-10-CM

## 2019-06-13 DIAGNOSIS — R109 Unspecified abdominal pain: Secondary | ICD-10-CM

## 2019-06-13 DIAGNOSIS — C762 Malignant neoplasm of abdomen: Secondary | ICD-10-CM

## 2019-06-13 NOTE — Therapy (Signed)
[facility] [address] [place], [place], [address] Phone: [phone]   Fax:  [phone]  Physical Therapy Treatment  Patient Details  Name: Kathryn Ramsey MRN: [id number] Date of Birth: [birth year] Referring Provider (PT): Dr. Lurline Del   Encounter Date: 06/13/2019  PT End of Session - 06/13/19 1452    Visit Number  2    Date for PT Re-Evaluation  08/15/19    Authorization Type  [employer]    Authorization - Visit Number  2    Authorization - Number of Visits  23    PT Start Time  [id number]    PT Stop Time  1525    PT Time Calculation (min)  40 min    Activity Tolerance  Patient tolerated treatment well    Behavior During Therapy  [facility] for tasks assessed/performed       Past Medical History:  Diagnosis Date  . Acute sensory neuropathy 06/26/2015  . Allergy   . Anemia   . Asthma    illness induced asthma  . Blood transfusion without reported diagnosis    at age 67 years old D/T surgery to femur being crushed  . 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 Heart murmur    pt, states had a "working heart murmur"  . History of kidney stones   . Neutropenia, drug-induced ([place]) 03/10/2018  . PONV (postoperative nausea and vomiting)   . Skin cancer     Past Surgical History:  Procedure Laterality Date  . 3 laporscopic proceedures    . ABDOMINAL HYSTERECTOMY     2010  . CHOLECYSTECTOMY     2010  . DILATION AND CURETTAGE OF UTERUS     1997  . IR IMAGING GUIDED PORT INSERTION  03/08/2018  . LAPAROSCOPY N/A 03/27/2015   Procedure: DIAGNOSTIC LAPAROSCOPY ;  Surgeon: [name], MD;  Location: [facility];  Service: Gynecology;  Laterality: N/A;  . LAPAROSCOPY N/A 02/24/2016   Procedure: LAPAROSCOPY DIAGNOSTIC WITH PERITONEAL WASHINGS AND PERITONEAL BIOPSY;  Surgeon: [name], MD;  Location: [facility];  Service: Gynecology;  Laterality: N/A;  . LAPAROTOMY N/A  03/27/2015   Procedure: EXPLORATORY LAPAROTOMY, BILATERAL SALPINGO OOPHORECTOMY, OMENTECTOMY, RADICAL TUMOR DEBULKING;  Surgeon: [name], MD;  Location: [facility];  Service: Gynecology;  Laterality: N/A;  . sinus surgery 1994      There were no vitals filed for this visit.  Subjective Assessment - 06/13/19 1450    Subjective  I had a bowel movement today. I am nauseated so not eating. My stomach feels stickiness. I am vomiting one time per night.    Patient Stated Goals  improve mobility of the intestines    Currently in Pain?  Yes    Pain Score  2     Pain Location  Abdomen    Pain Orientation  Mid    Pain Descriptors / Indicators  Discomfort    Pain Type  Chronic pain    Pain Onset  More than a month ago    Pain Frequency  Constant    Aggravating Factors   swelling in the abdomen    Pain Relieving Factors  not sure         [facility] PT Assessment - 06/13/19 0001      Observation/Other Assessments-Edema    Edema  Circumferential      Circumferential Edema   Circumferential - Right  prior to treamtent at umbilicus is 76 cm and after 76 cm                   [facility] Adult PT Treatment/Exercise - 06/13/19 0001      Exercises   Exercises  Other Exercises   discussed with patient walking 2 min. 3 times per day     Manual Therapy   Manual Therapy  Myofascial release    Soft tissue mobilization  circular motion of the abdomen to improve peristalic motion of the intestines    Myofascial Release  release around the stomach area, below the xipoid, iliocecal valve,              PT Education - 06/13/19 1530    Education Details  walking 2 min 3 times per day    Person(s) Educated  Patient    Methods  Explanation    Comprehension  Verbalized understanding       PT Short Term Goals - 06/13/19 1537      PT SHORT TERM GOAL #1   Title  able to reports pain level decreased by 1 level after treatment due to improved visceral mobility    Time  4    Period  Weeks     Status  On-going      PT SHORT TERM GOAL #2   Title  understand correct toileting technique to assist stool to come out with relaxation of the pelvic floor    Time  4    Period  Weeks    Status  On-going        PT Long Term Goals - 05/23/19 1404      PT LONG TERM GOAL #1   Title  understand how to perfrom lower abdominal fascial work to reduce the intestines from sticking together due to her cancer    Baseline  -----    Time  12    Period  Weeks    Status  New    Target Date  08/15/19      PT LONG TERM GOAL #2   Title  pain level after treatment decreased by 2 pain levels due to improved visceral mobility    Baseline  ----    Time  12    Period  Weeks    Status  New    Target Date  08/15/19      PT LONG TERM GOAL #3   Title  abdominal circumference reduced >/= 1 cm after treatment due to improved visceral mobility    Baseline  ---    Time  12    Period  Weeks    Status  New    Target Date  08/15/19      PT LONG TERM GOAL #4   Title  --------    Baseline  ------            Plan - 06/13/19 1530    Clinical Impression Statement  Patient waist circumference is 76 cm compared to 81 cm as the initial eval. Patient has decreased movement of the right upper quadrant area and along the midline. Patient had a bowel movement today but took several days for one. Patient reports she has bee fatiqued and not walking. Patient is vomiting at night and not able to eat much due to feeling nauseated. Patient will benefit from skilled therapy to improv eabdominal visceral and intestinal mobility to prevent constipation and reduce her pain for comfort.    Personal Factors and  Comorbidities  Comorbidity 3+;Age    Comorbidities  abdominal carcinomatosis; cancer of right fallopian tube; carcinomatosis peritonei; currently on chemotherapy; abdominal hysterectomy    Examination-Activity Limitations  Sleep;Bend;Locomotion Level    Examination-Participation Restrictions  Community  Activity;Interpersonal Relationship    Stability/Clinical Decision Making  Evolving/Moderate complexity    Rehab Potential  Good    PT Frequency  1x / week    PT Duration  12 weeks    PT Treatment/Interventions  Moist Heat;Therapeutic activities;Therapeutic exercise;Neuromuscular re-education;Manual techniques;Patient/family education    PT Next Visit Plan  visceral mobilization; mobilization of the diaphragm; rib cage mobilization; toileting    Recommended Other Services  MD signed initial eval    Consulted and Agree with Plan of Care  Patient       Patient will benefit from skilled therapeutic intervention in order to improve the following deficits and impairments:  Pain, Increased fascial restricitons, Decreased activity tolerance  Visit Diagnosis: Abdominal pain, unspecified abdominal location  Muscle spasm  Abdominal carcinomatosis [facility])     Problem List Patient Active Problem List   Diagnosis Date Noted  . Dysphagia 05/25/2019  . Neuropathy due to chemotherapeutic drug ([place]) 07/24/2018  . Anemia due to antineoplastic chemotherapy 05/22/2018  . Port-A-Cath in place 05/01/2018  . Neutropenia, drug-induced ([place]) 03/10/2018  . Diarrhea 08/02/2017  . Lichen sclerosus et atrophicus of the vulva 03/31/2016  . Abdominal wall mass of right lower quadrant 03/08/2016  . 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 Goals of care, counseling/discussion 02/27/2016  . Elevated CA-125 02/24/2016  . Vaginal dryness, menopausal 10/01/2015  . Drug-induced peripheral neuropathy ([place]) 09/02/2015  . Leukopenia due to antineoplastic chemotherapy ([place]) 09/02/2015  . Chemotherapy-induced neuropathy ([place]) 08/19/2015  . Cellulitis 07/03/2015  . Acute sensory neuropathy 06/26/2015  . High risk medication use 06/21/2015  . Facial paresthesia 06/18/2015  . Genetic testing 06/16/2015  . Restless legs 05/14/2015  . Family history of breast cancer in female 04/29/2015  . Chemotherapy-induced  nausea 04/22/2015  . Chemotherapy induced neutropenia ([place]) 04/22/2015  . 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 History of hysterectomy for benign disease 03/21/2015  . IBS (irritable bowel syndrome) 03/21/2015  . Dense breast tissue 03/21/2015  . History of cholecystectomy 03/21/2015  . Abdominal carcinomatosis ([place]) 03/21/2015    Earlie Counts, PT 06/13/19 3:38 PM   [facility] [address], [place], [place], [address] Phone: [phone]   Fax:  [phone]  Name: Kathryn Ramsey MRN: [id number] Date of Birth: 03-04-1965

## 2019-06-15 ENCOUNTER — Encounter: Payer: Self-pay | Admitting: Adult Health

## 2019-06-15 ENCOUNTER — Inpatient Hospital Stay: Payer: 59

## 2019-06-15 ENCOUNTER — Other Ambulatory Visit: Payer: Self-pay | Admitting: Oncology

## 2019-06-15 ENCOUNTER — Inpatient Hospital Stay: Payer: 59 | Admitting: Adult Health

## 2019-06-18 ENCOUNTER — Encounter: Payer: Self-pay | Admitting: Oncology

## 2019-06-19 ENCOUNTER — Inpatient Hospital Stay: Payer: 59 | Attending: Oncology

## 2019-06-19 ENCOUNTER — Other Ambulatory Visit: Payer: Self-pay

## 2019-06-19 ENCOUNTER — Telehealth: Payer: Self-pay | Admitting: *Deleted

## 2019-06-19 ENCOUNTER — Other Ambulatory Visit: Payer: Self-pay | Admitting: *Deleted

## 2019-06-19 ENCOUNTER — Inpatient Hospital Stay: Payer: 59

## 2019-06-19 VITALS — BP 111/89 | HR 83 | Resp 17

## 2019-06-19 DIAGNOSIS — Z823 Family history of stroke: Secondary | ICD-10-CM | POA: Insufficient documentation

## 2019-06-19 DIAGNOSIS — C762 Malignant neoplasm of abdomen: Secondary | ICD-10-CM

## 2019-06-19 DIAGNOSIS — R110 Nausea: Secondary | ICD-10-CM

## 2019-06-19 DIAGNOSIS — Z882 Allergy status to sulfonamides status: Secondary | ICD-10-CM | POA: Diagnosis not present

## 2019-06-19 DIAGNOSIS — Z87442 Personal history of urinary calculi: Secondary | ICD-10-CM | POA: Diagnosis not present

## 2019-06-19 DIAGNOSIS — Z842 Family history of other diseases of the genitourinary system: Secondary | ICD-10-CM | POA: Insufficient documentation

## 2019-06-19 DIAGNOSIS — D709 Neutropenia, unspecified: Secondary | ICD-10-CM | POA: Diagnosis not present

## 2019-06-19 DIAGNOSIS — Z9079 Acquired absence of other genital organ(s): Secondary | ICD-10-CM | POA: Diagnosis not present

## 2019-06-19 DIAGNOSIS — Z803 Family history of malignant neoplasm of breast: Secondary | ICD-10-CM | POA: Diagnosis not present

## 2019-06-19 DIAGNOSIS — Z808 Family history of malignant neoplasm of other organs or systems: Secondary | ICD-10-CM | POA: Insufficient documentation

## 2019-06-19 DIAGNOSIS — Z90722 Acquired absence of ovaries, bilateral: Secondary | ICD-10-CM | POA: Insufficient documentation

## 2019-06-19 DIAGNOSIS — Z8249 Family history of ischemic heart disease and other diseases of the circulatory system: Secondary | ICD-10-CM | POA: Diagnosis not present

## 2019-06-19 DIAGNOSIS — Z8051 Family history of malignant neoplasm of kidney: Secondary | ICD-10-CM | POA: Insufficient documentation

## 2019-06-19 DIAGNOSIS — C5701 Malignant neoplasm of right fallopian tube: Secondary | ICD-10-CM | POA: Insufficient documentation

## 2019-06-19 DIAGNOSIS — R188 Other ascites: Secondary | ICD-10-CM | POA: Diagnosis not present

## 2019-06-19 DIAGNOSIS — T451X5A Adverse effect of antineoplastic and immunosuppressive drugs, initial encounter: Secondary | ICD-10-CM | POA: Insufficient documentation

## 2019-06-19 DIAGNOSIS — Z888 Allergy status to other drugs, medicaments and biological substances status: Secondary | ICD-10-CM | POA: Insufficient documentation

## 2019-06-19 DIAGNOSIS — R066 Hiccough: Secondary | ICD-10-CM | POA: Diagnosis not present

## 2019-06-19 DIAGNOSIS — Z79899 Other long term (current) drug therapy: Secondary | ICD-10-CM | POA: Diagnosis not present

## 2019-06-19 DIAGNOSIS — R5383 Other fatigue: Secondary | ICD-10-CM | POA: Insufficient documentation

## 2019-06-19 DIAGNOSIS — D6481 Anemia due to antineoplastic chemotherapy: Secondary | ICD-10-CM | POA: Diagnosis not present

## 2019-06-19 DIAGNOSIS — Z95828 Presence of other vascular implants and grafts: Secondary | ICD-10-CM

## 2019-06-19 DIAGNOSIS — G620 Drug-induced polyneuropathy: Secondary | ICD-10-CM | POA: Insufficient documentation

## 2019-06-19 DIAGNOSIS — D701 Agranulocytosis secondary to cancer chemotherapy: Secondary | ICD-10-CM | POA: Diagnosis not present

## 2019-06-19 DIAGNOSIS — Z7952 Long term (current) use of systemic steroids: Secondary | ICD-10-CM | POA: Diagnosis not present

## 2019-06-19 DIAGNOSIS — Z85828 Personal history of other malignant neoplasm of skin: Secondary | ICD-10-CM | POA: Diagnosis not present

## 2019-06-19 DIAGNOSIS — D702 Other drug-induced agranulocytosis: Secondary | ICD-10-CM

## 2019-06-19 LAB — CBC WITH DIFFERENTIAL (CANCER CENTER ONLY)
Abs Immature Granulocytes: 0.12 10*3/uL — ABNORMAL HIGH (ref 0.00–0.07)
Basophils Absolute: 0 10*3/uL (ref 0.0–0.1)
Basophils Relative: 0 %
Eosinophils Absolute: 0 10*3/uL (ref 0.0–0.5)
Eosinophils Relative: 0 %
HCT: 29.5 % — ABNORMAL LOW (ref 36.0–46.0)
Hemoglobin: 9.6 g/dL — ABNORMAL LOW (ref 12.0–15.0)
Immature Granulocytes: 3 %
Lymphocytes Relative: 19 %
Lymphs Abs: 0.8 10*3/uL (ref 0.7–4.0)
MCH: 29.4 pg (ref 26.0–34.0)
MCHC: 32.5 g/dL (ref 30.0–36.0)
MCV: 90.2 fL (ref 80.0–100.0)
Monocytes Absolute: 0.2 10*3/uL (ref 0.1–1.0)
Monocytes Relative: 5 %
Neutro Abs: 2.9 10*3/uL (ref 1.7–7.7)
Neutrophils Relative %: 73 %
Platelet Count: 70 10*3/uL — ABNORMAL LOW (ref 150–400)
RBC: 3.27 MIL/uL — ABNORMAL LOW (ref 3.87–5.11)
RDW: 15.7 % — ABNORMAL HIGH (ref 11.5–15.5)
WBC Count: 3.9 10*3/uL — ABNORMAL LOW (ref 4.0–10.5)
nRBC: 0 % (ref 0.0–0.2)

## 2019-06-19 LAB — CMP (CANCER CENTER ONLY)
ALT: 10 U/L (ref 0–44)
AST: 13 U/L — ABNORMAL LOW (ref 15–41)
Albumin: 3.3 g/dL — ABNORMAL LOW (ref 3.5–5.0)
Alkaline Phosphatase: 123 U/L (ref 38–126)
Anion gap: 10 (ref 5–15)
BUN: 26 mg/dL — ABNORMAL HIGH (ref 6–20)
CO2: 26 mmol/L (ref 22–32)
Calcium: 9.1 mg/dL (ref 8.9–10.3)
Chloride: 100 mmol/L (ref 98–111)
Creatinine: 0.78 mg/dL (ref 0.44–1.00)
GFR, Est AFR Am: >60 mL/min (ref >60)
GFR, Estimated: >60 mL/min (ref >60)
Glucose, Bld: 105 mg/dL — ABNORMAL HIGH (ref 70–99)
Potassium: 3.7 mmol/L (ref 3.5–5.1)
Sodium: 136 mmol/L (ref 135–145)
Total Bilirubin: 0.5 mg/dL (ref 0.3–1.2)
Total Protein: 7.5 g/dL (ref 6.5–8.1)

## 2019-06-19 LAB — MAGNESIUM: Magnesium: 1.3 mg/dL — CL (ref 1.7–2.4)

## 2019-06-19 LAB — SAMPLE TO BLOOD BANK

## 2019-06-19 MED ORDER — MAGNESIUM SULFATE 2 GM/50ML IV SOLN
2.0000 g | Freq: Once | INTRAVENOUS | Status: AC
  Administered 2019-06-19: 16:00:00 2 g via INTRAVENOUS

## 2019-06-19 MED ORDER — HEPARIN SOD (PORK) LOCK FLUSH 100 UNIT/ML IV SOLN
500.0000 [IU] | Freq: Once | INTRAVENOUS | Status: AC
  Administered 2019-06-19: 17:00:00 500 [IU] via INTRAVENOUS
  Filled 2019-06-19: qty 5

## 2019-06-19 MED ORDER — SODIUM CHLORIDE 0.9% FLUSH
10.0000 mL | Freq: Once | INTRAVENOUS | Status: AC
  Administered 2019-06-19: 17:00:00 10 mL via INTRAVENOUS
  Filled 2019-06-19: qty 10

## 2019-06-19 MED ORDER — MAGNESIUM SULFATE 2 GM/50ML IV SOLN
INTRAVENOUS | Status: AC
  Filled 2019-06-19: qty 50

## 2019-06-19 MED ORDER — MAGNESIUM SULFATE 50 % IJ SOLN: 1.0000 g | Freq: Once | INTRAVENOUS | Status: DC

## 2019-06-19 MED ORDER — SODIUM CHLORIDE 0.9 % IV SOLN
Freq: Once | INTRAVENOUS | Status: AC
  Filled 2019-06-19: qty 250

## 2019-06-19 MED ORDER — SODIUM CHLORIDE 0.9 % IV SOLN
INTRAVENOUS | Status: AC
  Filled 2019-06-19 (×2): qty 250

## 2019-06-19 MED ORDER — SODIUM CHLORIDE 0.45 % IV SOLN
Freq: Once | INTRAVENOUS | Status: AC
  Filled 2019-06-19: qty 1000

## 2019-06-19 NOTE — Patient Instructions (Signed)

## 2019-06-19 NOTE — Telephone Encounter (Signed)
Per My Chart message pt states need for IVF today- obtained time per charge nurse and orders per MD review.  Pt made aware of above.

## 2019-06-21 ENCOUNTER — Inpatient Hospital Stay: Payer: 59

## 2019-06-21 ENCOUNTER — Other Ambulatory Visit: Payer: Self-pay

## 2019-06-21 ENCOUNTER — Encounter: Payer: Self-pay | Admitting: Physical Therapy

## 2019-06-21 ENCOUNTER — Ambulatory Visit: Payer: 59 | Attending: Gynecologic Oncology | Admitting: Physical Therapy

## 2019-06-21 DIAGNOSIS — R109 Unspecified abdominal pain: Secondary | ICD-10-CM | POA: Diagnosis not present

## 2019-06-21 DIAGNOSIS — M62838 Other muscle spasm: Secondary | ICD-10-CM | POA: Insufficient documentation

## 2019-06-21 DIAGNOSIS — C762 Malignant neoplasm of abdomen: Secondary | ICD-10-CM | POA: Diagnosis present

## 2019-06-21 DIAGNOSIS — Z95828 Presence of other vascular implants and grafts: Secondary | ICD-10-CM

## 2019-06-21 DIAGNOSIS — C5701 Malignant neoplasm of right fallopian tube: Secondary | ICD-10-CM | POA: Diagnosis not present

## 2019-06-21 MED ORDER — SODIUM CHLORIDE 0.45 % IV SOLN
Freq: Once | INTRAVENOUS | Status: AC
  Filled 2019-06-21: qty 1000

## 2019-06-21 MED ORDER — HEPARIN SOD (PORK) LOCK FLUSH 10 UNIT/ML IV SOLN: 10.0000 [IU] | Freq: Once | INTRAVENOUS | Status: DC

## 2019-06-21 MED ORDER — SODIUM CHLORIDE 0.9% FLUSH
10.0000 mL | Freq: Once | INTRAVENOUS | Status: AC
  Administered 2019-06-21: 10 mL via INTRAVENOUS
  Filled 2019-06-21: qty 10

## 2019-06-21 MED ORDER — SODIUM CHLORIDE 0.9 % IV SOLN
INTRAVENOUS | Status: DC
  Filled 2019-06-21: qty 250

## 2019-06-21 MED ORDER — MAGNESIUM SULFATE 50 % IJ SOLN
1.0000 g | Freq: Once | INTRAVENOUS | Status: AC
  Administered 2019-06-21: 1 g via INTRAVENOUS
  Filled 2019-06-21: qty 2

## 2019-06-21 MED ORDER — HEPARIN SOD (PORK) LOCK FLUSH 100 UNIT/ML IV SOLN
500.0000 [IU] | Freq: Once | INTRAVENOUS | Status: AC
  Administered 2019-06-21: 500 [IU] via INTRAVENOUS
  Filled 2019-06-21: qty 5

## 2019-06-21 NOTE — Therapy (Signed)
[facility] [address] [place], [place], [address] Phone: [phone]   Fax:  [phone]  Physical Therapy Treatment  Patient Details  Name: Kathryn Ramsey MRN: [id number] Date of Birth: Dec 06, 1965 Referring Provider (PT): Dr. Lurline Del   Encounter Date: 06/21/2019  PT End of Session - 06/21/19 1454    Visit Number  3    Date for PT Re-Evaluation  08/15/19    Authorization Type  U[employer]    Authorization - Visit Number  3    Authorization - Number of Visits  23    PT Start Time  [id number]    PT Stop Time  1525    PT Time Calculation (min)  40 min    Activity Tolerance  Patient tolerated treatment well    Behavior During Therapy  [facility] for tasks assessed/performed       Past Medical History:  Diagnosis Date  . Acute sensory neuropathy 06/26/2015  . Allergy   . Anemia   . Asthma    illness induced asthma  . Blood transfusion without reported diagnosis    at age 84 years old D/T surgery to femur being crushed  . 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 Heart murmur    pt, states had a "working heart murmur"  . History of kidney stones   . Neutropenia, drug-induced ([place]) 03/10/2018  . PONV (postoperative nausea and vomiting)   . Skin cancer     Past Surgical History:  Procedure Laterality Date  . 3 laporscopic proceedures    . ABDOMINAL HYSTERECTOMY     2010  . CHOLECYSTECTOMY     2010  . DILATION AND CURETTAGE OF UTERUS     1997  . IR IMAGING GUIDED PORT INSERTION  03/08/2018  . LAPAROSCOPY N/A 03/27/2015   Procedure: DIAGNOSTIC LAPAROSCOPY ;  Surgeon: [name], MD;  Location: [facility];  Service: Gynecology;  Laterality: N/A;  . LAPAROSCOPY N/A 02/24/2016   Procedure: LAPAROSCOPY DIAGNOSTIC WITH PERITONEAL WASHINGS AND PERITONEAL BIOPSY;  Surgeon: [name], MD;  Location: [facility];  Service: Gynecology;  Laterality: N/A;  . LAPAROTOMY N/A  03/27/2015   Procedure: EXPLORATORY LAPAROTOMY, BILATERAL SALPINGO OOPHORECTOMY, OMENTECTOMY, RADICAL TUMOR DEBULKING;  Surgeon: [name], MD;  Location: [facility];  Service: Gynecology;  Laterality: N/A;  . sinus surgery 1994      There were no vitals filed for this visit.  Subjective Assessment - 06/21/19 1450    Subjective  After the last visit I felt things got unstuck. I felt crampy after last visit and bowels were moving more. I have been getting fluid now to help me feel better. I have not thrown up in 2 days. My bowels movement serveral times yesterday.  I am walking 10 minutes.    Patient Stated Goals  improve mobility of the intestines    Currently in Pain?  Yes    Pain Score  3     Pain Location  Abdomen    Pain Orientation  Mid    Pain Descriptors / Indicators  Discomfort    Pain Type  Chronic pain    Pain Onset  More than a month ago    Pain Frequency  Constant    Aggravating Factors   swelling in the abdomen    Pain Relieving Factors  not sure    Multiple Pain Sites  No  [facility] Adult PT Treatment/Exercise - 06/21/19 0001      Therapeutic Activites    Therapeutic Activities  Other Therapeutic Activities    Other Therapeutic Activities  instructed patient on how to have a bowel movement, relaxing the pelvic floor, using a low voice,       Exercises   Exercises  Other Exercises    Other Exercises   education on doing twisting yoga moves to improve intestinal movement      Manual Therapy   Manual Therapy  Myofascial release    Myofascial Release  release around the stomach area, below the xipoid, iliocecal valve, , right side of the lateral abdominal             PT Education - 06/21/19 1529    Education Details  toileting    Person(s) Educated  Patient    Methods  Explanation    Comprehension  Verbalized understanding       PT Short Term Goals - 06/13/19 1537      PT SHORT TERM GOAL #1   Title  able to reports pain  level decreased by 1 level after treatment due to improved visceral mobility    Time  4    Period  Weeks    Status  On-going      PT SHORT TERM GOAL #2   Title  understand correct toileting technique to assist stool to come out with relaxation of the pelvic floor    Time  4    Period  Weeks    Status  On-going        PT Long Term Goals - 06/21/19 1532      PT LONG TERM GOAL #1   Title  understand how to perfrom lower abdominal fascial work to reduce the intestines from sticking together due to her cancer    Time  12    Period  Weeks    Status  On-going      PT LONG TERM GOAL #2   Title  pain level after treatment decreased by 2 pain levels due to improved visceral mobility    Period  Weeks    Status  On-going      PT LONG TERM GOAL #3   Title  abdominal circumference reduced >/= 1 cm after treatment due to improved visceral mobility    Time  12    Period  Weeks    Status  On-going            Plan - 06/21/19 1455    Clinical Impression Statement  Patient is now getting fluids to help with her dehydration. Patient was able to have a bowel movement after last visit. Patient had decreased swelling after therapy and increased bowel sounds. Patient has not vomited in the past 2 days. Patient is now able to eat a little better. Patient understands how to correctly toilet with pelvic floor relaxation and low sounds. Patient wil benefit from skilled therapy to improve abdominal viseral and intestinal mobility to prevent constipation and reduce her pain for comfort.    Personal Factors and Comorbidities  Comorbidity 3+;Age    Comorbidities  abdominal carcinomatosis; cancer of right fallopian tube; carcinomatosis peritonei; currently on chemotherapy; abdominal hysterectomy    Examination-Activity Limitations  Sleep;Bend;Locomotion Level    Examination-Participation Restrictions  Community Activity;Interpersonal Relationship    Stability/Clinical Decision Making  Evolving/Moderate  complexity    Rehab Potential  Good    PT Frequency  1x / week    PT  Duration  12 weeks    PT Treatment/Interventions  Moist Heat;Therapeutic activities;Therapeutic exercise;Neuromuscular re-education;Manual techniques;Patient/family education    PT Next Visit Plan  visceral mobilization; mobilization of the diaphragm; rib cage mobilization; measure abdominal circumference    Consulted and Agree with Plan of Care  Patient       Patient will benefit from skilled therapeutic intervention in order to improve the following deficits and impairments:  Pain, Increased fascial restricitons, Decreased activity tolerance  Visit Diagnosis: Abdominal pain, unspecified abdominal location  Muscle spasm  Abdominal carcinomatosis [facility])     Problem List Patient Active Problem List   Diagnosis Date Noted  . Dysphagia 05/25/2019  . Neuropathy due to chemotherapeutic drug ([place]) 07/24/2018  . Anemia due to antineoplastic chemotherapy 05/22/2018  . Port-A-Cath in place 05/01/2018  . Neutropenia, drug-induced ([place]) 03/10/2018  . Diarrhea 08/02/2017  . Lichen sclerosus et atrophicus of the vulva 03/31/2016  . Abdominal wall mass of right lower quadrant 03/08/2016  . 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 Goals of care, counseling/discussion 02/27/2016  . Elevated CA-125 02/24/2016  . Vaginal dryness, menopausal 10/01/2015  . Drug-induced peripheral neuropathy ([place]) 09/02/2015  . Leukopenia due to antineoplastic chemotherapy ([place]) 09/02/2015  . Chemotherapy-induced neuropathy ([place]) 08/19/2015  . Cellulitis 07/03/2015  . Acute sensory neuropathy 06/26/2015  . High risk medication use 06/21/2015  . Facial paresthesia 06/18/2015  . Genetic testing 06/16/2015  . Restless legs 05/14/2015  . Family history of breast cancer in female 04/29/2015  . Chemotherapy-induced nausea 04/22/2015  . Chemotherapy induced neutropenia ([place]) 04/22/2015  . 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  History of hysterectomy for benign disease 03/21/2015  . IBS (irritable bowel syndrome) 03/21/2015  . Dense breast tissue 03/21/2015  . History of cholecystectomy 03/21/2015  . Abdominal carcinomatosis ([place]) 03/21/2015    Earlie Counts, PT 06/21/19 3:34 PM   [facility] [address], [place], [place], [address] Phone: [phone]   Fax:  [phone]  Name: Kathryn Ramsey MRN: [id number] Date of Birth: January 24, 1966

## 2019-06-21 NOTE — Patient Instructions (Signed)
Toileting Techniques for Bowel Movements    An Evacuation/Defecation Plan   Here are the 4 basic points:  1. Lean forward enough for your elbows to rest on your knees 2. Support your feet on the floor or use a low stool if your feet don't touch the floor  3. Push out your belly as if you have swallowed a beach ball--you should feel a widening of your waist. "Belly Big, Belly Hard" 4. Open and relax your pelvic floor muscles, rather than tightening around the anus  While you are sitting on the toilet pay attention to the following areas: . Jaw and mouth position- relaxed not clenched . Angle of your hips - leaning slightly forward . Whether your feet touch the ground or not - should be flat and supported . Arm placement - rest against your thighs . Spine position - flat back . Waist . Breathing - exhale as you push (like blowing up a balloon or try using other sounds such as ahhhh, shhhhh, ohhhh or grrrrrrr) . Belly - hard and tight as you push . Anus (opening of the anal canal) - relaxed and open as you push . Anus - Tighten and lift pulling the muscle back in after you are done or if taking a break  If you are not successful after 10-15 minutes, try again later.  Avoid negative self-talk about your toileting experience.   Read this for more details and ask your PT if you need suggestions for adjustments or limitations:  1) Sitting on the toilet  a) Make sure your feet are supported - flat on the floor or step stool b) Many people find it effective to lean forward or raise their knees.  Propping your feet on a step stool (squatty potty is a brand name) can help the muscles around the anus to relax  c) When you lean forward, place your forearms on your thighs for support  2) Relaxing a) Breathe deeply and slowly in through your nose and out through your mouth. b) To become aware of how to relax your muscles, contracting and releasing muscles can be helpful.  Pull your pelvic floor  muscles in tightly by using the image of holding back gas, or closing around the anus (visualize making a circle smaller) and lifting the anus up and in.  Then release the muscles and your anus should drop down and feel open. Repeat 5 times ending with the feeling of relaxation. c) Keep your pelvic floor muscles relaxed; let your belly bulge out. d) The digestive tract starts at the mouth and ends at the anal opening, so be sure to relax both ends of the tube.  Place your tongue on the roof of your mouth with your teeth separated.  This helps relax your mouth and will help to relax the anus at the same time.  3) Emptying (defecation) a) Keep your pelvic floor and sphincter relaxed, then bulge your anal muscles.  Make the anal opening wide.  b) Stick your belly out as if you have swallowed a beach ball. c) Make your belly wall hard using your belly muscles while continuing to breathe. Doing this makes it easier to open your anus. d) Breath out and give a grunt (or try using other sounds such as ahhhh, shhhhh, ohhhh or grrrrrrr). e)  Can also try to act as if you are blowing up a balloon as you push  4) Finishing a) As you finish your bowel movement, pull the pelvic floor muscles   up and in.  This will leave your anus in the proper place rather than remaining pushed out and down. If you leave your anus pushed out and down, it will start to feel as though that is normal and give you incorrect signals about needing to have a bowel movement.  

## 2019-06-21 NOTE — Patient Instructions (Signed)

## 2019-06-25 ENCOUNTER — Encounter: Payer: Self-pay | Admitting: Physical Therapy

## 2019-06-25 ENCOUNTER — Other Ambulatory Visit: Payer: Self-pay

## 2019-06-25 ENCOUNTER — Ambulatory Visit: Payer: 59 | Admitting: Physical Therapy

## 2019-06-25 DIAGNOSIS — C762 Malignant neoplasm of abdomen: Secondary | ICD-10-CM

## 2019-06-25 DIAGNOSIS — M62838 Other muscle spasm: Secondary | ICD-10-CM

## 2019-06-25 DIAGNOSIS — R109 Unspecified abdominal pain: Secondary | ICD-10-CM | POA: Diagnosis not present
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
## 2019-06-26 ENCOUNTER — Telehealth: Payer: Self-pay | Admitting: *Deleted

## 2019-06-26 DIAGNOSIS — D701 Agranulocytosis secondary to cancer chemotherapy: Secondary | ICD-10-CM

## 2019-06-26 NOTE — Telephone Encounter (Signed)
Kathryn Ramsey will come in tomorrow at 0830 for labs. She will see Dr Jana Hakim on Thursday at 0830.   Message to scheduler

## 2019-06-26 NOTE — Progress Notes (Signed)
Pharmacist Chemotherapy Monitoring - Follow Up Assessment    I verify that I have reviewed each item in the below checklist:  . Regimen for the patient is scheduled for the appropriate day and plan matches scheduled date. [name] Appropriate non-routine labs are ordered dependent on drug ordered. . If applicable, additional medications reviewed and ordered per protocol based on lifetime cumulative doses and/or treatment regimen.   Plan for follow-up and/or issues identified: No . I-vent associated with next due treatment: No . MD and/or nursing notified: No  Kathryn Ramsey D 06/26/2019 3:43 PM

## 2019-06-26 NOTE — Telephone Encounter (Signed)
Kathryn Ramsey states she has noticed bruising "all over my body". She is aware that her platelets are low. Had acupuncture treatment and noticed immediate bruising. States she is feeling exhausted, but does not feel she needs IVF.   Is scheduled to see Dr Jana Hakim on Friday and have labs/ treatment on Monday. Is going out of town this Thursday. Wants to know if she can see Dr Jana Hakim Thursday morning or Monday during treatment.  Does not have acupuncture scheduled until later next week- will discuss with Dr Jana Hakim.

## 2019-06-27 ENCOUNTER — Other Ambulatory Visit: Payer: Self-pay

## 2019-06-27 ENCOUNTER — Inpatient Hospital Stay: Payer: 59

## 2019-06-27 DIAGNOSIS — C786 Secondary malignant neoplasm of retroperitoneum and peritoneum: Secondary | ICD-10-CM

## 2019-06-27 DIAGNOSIS — T451X5A Adverse effect of antineoplastic and immunosuppressive drugs, initial encounter: Secondary | ICD-10-CM

## 2019-06-27 DIAGNOSIS — G620 Drug-induced polyneuropathy: Secondary | ICD-10-CM

## 2019-06-27 DIAGNOSIS — C5701 Malignant neoplasm of right fallopian tube: Secondary | ICD-10-CM

## 2019-06-27 DIAGNOSIS — D701 Agranulocytosis secondary to cancer chemotherapy: Secondary | ICD-10-CM

## 2019-06-27 DIAGNOSIS — C762 Malignant neoplasm of abdomen: Secondary | ICD-10-CM

## 2019-06-27 LAB — CBC WITH DIFFERENTIAL (CANCER CENTER ONLY)
Abs Immature Granulocytes: 0.01 10*3/uL (ref 0.00–0.07)
Basophils Absolute: 0 10*3/uL (ref 0.0–0.1)
Basophils Relative: 0 %
Eosinophils Absolute: 0 10*3/uL (ref 0.0–0.5)
Eosinophils Relative: 0 %
HCT: 26.4 % — ABNORMAL LOW (ref 36.0–46.0)
Hemoglobin: 8.6 g/dL — ABNORMAL LOW (ref 12.0–15.0)
Immature Granulocytes: 1 %
Lymphocytes Relative: 38 %
Lymphs Abs: 0.7 10*3/uL (ref 0.7–4.0)
MCH: 29.1 pg (ref 26.0–34.0)
MCHC: 32.6 g/dL (ref 30.0–36.0)
MCV: 89.2 fL (ref 80.0–100.0)
Monocytes Absolute: 0.2 10*3/uL (ref 0.1–1.0)
Monocytes Relative: 13 %
Neutro Abs: 0.9 10*3/uL — ABNORMAL LOW (ref 1.7–7.7)
Neutrophils Relative %: 48 %
Platelet Count: 26 10*3/uL — ABNORMAL LOW (ref 150–400)
RBC: 2.96 MIL/uL — ABNORMAL LOW (ref 3.87–5.11)
RDW: 15.6 % — ABNORMAL HIGH (ref 11.5–15.5)
WBC Count: 1.8 10*3/uL — ABNORMAL LOW (ref 4.0–10.5)
nRBC: 0 % (ref 0.0–0.2)

## 2019-06-27 LAB — COMPREHENSIVE METABOLIC PANEL
ALT: 9 U/L (ref 0–44)
AST: 12 U/L — ABNORMAL LOW (ref 15–41)
Albumin: 3.4 g/dL — ABNORMAL LOW (ref 3.5–5.0)
Alkaline Phosphatase: 109 U/L (ref 38–126)
Anion gap: 12 (ref 5–15)
BUN: 18 mg/dL (ref 6–20)
CO2: 25 mmol/L (ref 22–32)
Calcium: 9.1 mg/dL (ref 8.9–10.3)
Chloride: 101 mmol/L (ref 98–111)
Creatinine, Ser: 0.89 mg/dL (ref 0.44–1.00)
GFR calc Af Amer: >60 mL/min (ref >60)
GFR calc non Af Amer: >60 mL/min (ref >60)
Glucose, Bld: 91 mg/dL (ref 70–99)
Potassium: 3.7 mmol/L (ref 3.5–5.1)
Sodium: 138 mmol/L (ref 135–145)
Total Bilirubin: 0.3 mg/dL (ref 0.3–1.2)
Total Protein: 7.7 g/dL (ref 6.5–8.1)

## 2019-06-27 LAB — SAMPLE TO BLOOD BANK

## 2019-06-27 NOTE — Progress Notes (Addendum)
[facility]  [phone] [phone]    ID: Kathryn Ramsey DOB: [birth year]  MR#: [id number]  EPP#:[id number]  Patient Care Team: Patient, No Pcp Per as PCP - General (General Practice) [name], MD as Consulting Physician (Obstetrics and Gynecology) Juanita Craver, MD as Consulting Physician (Gastroenterology) Servando Salina, MD as Consulting Physician (Obstetrics and Gynecology) Nickie Retort, MD as Consulting Physician (Urology) Gillis Ends, MD as Referring Physician (Obstetrics and Gynecology) Mettu, Baltazar Apo, MD as Referring Physician (Oncology) OTHER MD: Moss Mc [phone]); Floyde Parkins; Gerhard Perches Matulonis [phone])  NOTE: Patient does not want to be informed of her CA-125.  CHIEF COMPLAINT:  ovarian cancer  CURRENT TREATMENT: Liposomal doxorubicin, carboplatin   INTERVAL HISTORY: Kathryn Ramsey returns today for follow-up and treatment of her recurrent ovarian cancer accompanied by her husband Kathryn Ramsey  She began liposomal doxorubicin, along with Carboplatin, on 05/10/2019.  She is currently day 20 to cycle 2 and she was supposed to get treated again tomorrow but she backed off because this is her birthday weekend.  Her treatment has been rescheduled for Monday.   Echocardiogram on 05/09/2019 noted a well preserved EF of 55-60%.   She has been working with Dr. Theora Gianotti at [facility], Dr. Gerhard Perches Matulonis at [facility] and dr Moss Mc at [facility].  The idea is that if the current treatment does not work or it becomes intolerable there will be a plan in place.  [name]'s most recent left thoracentesis was 05/21/2019.  She was previously having fairly frequent thoracentesis so the fact that she has not required 1 in more than a month is certainly significant.    REVIEW OF SYSTEMS: She also reports less nausea and no vomiting in the last 2 weeks.  She continues with a partial obstruction, mildly  constipated, and to take Senokot for this every other day.  She for some reason has not started MiraLAX.  She is receiving acupuncture and that has helped the peripheral neuropathy some.  She has bruising in various places including her legs, a little bit on the abdomen, and one in the middle of her forehead which she attributes to and excessively vigorous bout of acupuncture.  She had an episode of headache Friday which resolved with no intervention.  She says in the evenings sometimes she feels tired cold and then gets a funny feeling that she describes as a crawly feeling over her scalp which last about 5 seconds.  She continues to have some reflux issues.  She tells me her weight at home has been stable   HISTORY OF PRESENT ILLNESS: From Dr. Serita Grit original intake note 03/17/2015:  "Kathryn Ramsey is a very pleasant G4P4 who is seen in consultation at the request of Dr Collene Mares and Dr Garwin Brothers for peritoneal carcinomatosis. The patient has a history of a workup by urology for gross hematuria which included a pelvic examine was suggested for extrinsic mass effect on the bladder on cystoscopy. Cystoscopy took place on in December 2016. The patient denies abdominal pain bloating early satiety or abdominal distention.  On 08/03/2015 at [facility] she underwent a CT scan of the abdomen and pelvis as ordered by Dr Baruch Gouty. This revealed a 1.4 cm low attenuation lesion on the posterior right hepatic lobe suggestive of a hemangioma. Status post hysterectomy. No ovarian masses. Moderate ascites. Omental caking seen in the lateral left abdomen and pelvis. Peritoneal nodularity in the left lateral pelvic cul-de-sac. No gross extrinsic compression on the bladder  was identified on imaging.  The patient was then seen by her gastroenterologist, Dr. Collene Mares, who performed a colonoscopy which was unremarkable.  Tumor markers were drawn on 08/04/2015 and these included a CA-125 that was elevated to 957, and a CEA  that was normal at 1."  On 03/27/2015 the patient underwent diagnostic laparoscopy, exploratory laparotomy with bilateral salpingo-oophorectomy, omentectomy, and radical tumor debulking with optimal cytoreduction (R0) of what proved to be a stage IIIc right fallopian tube cancer. She was treated adjuvantly with carboplatin and paclitaxel, as detailed below. Her CA 125 dropped from 1226 on 03/20/2015 to 26.1 by 06/16/2015.  Her subsequent history is as detailed above.   PAST MEDICAL HISTORY: Past Medical History:  Diagnosis Date  . Acute sensory neuropathy 06/26/2015  . Allergy   . Anemia   . Asthma    illness induced asthma  . Blood transfusion without reported diagnosis    at age 19 years old D/T surgery to femur being crushed  . 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 Heart murmur    pt, states had a "working heart murmur"  . History of kidney stones   . Neutropenia, drug-induced ([facility]) 03/10/2018  . PONV (postoperative nausea and vomiting)   . Skin cancer     PAST SURGICAL HISTORY: Past Surgical History:  Procedure Laterality Date  . 3 laporscopic proceedures    . ABDOMINAL HYSTERECTOMY     2010  . CHOLECYSTECTOMY     2010  . DILATION AND CURETTAGE OF UTERUS     1997  . IR IMAGING GUIDED PORT INSERTION  03/08/2018  . LAPAROSCOPY N/A 03/27/2015   Procedure: DIAGNOSTIC LAPAROSCOPY ;  Surgeon: [name], MD;  Location: [facility];  Service: Gynecology;  Laterality: N/A;  . LAPAROSCOPY N/A 02/24/2016   Procedure: LAPAROSCOPY DIAGNOSTIC WITH PERITONEAL WASHINGS AND PERITONEAL BIOPSY;  Surgeon: [name], MD;  Location: [facility];  Service: Gynecology;  Laterality: N/A;  . LAPAROTOMY N/A 03/27/2015   Procedure: EXPLORATORY LAPAROTOMY, BILATERAL SALPINGO OOPHORECTOMY, OMENTECTOMY, RADICAL TUMOR DEBULKING;  Surgeon: [name], MD;  Location: [facility];  Service: Gynecology;  Laterality: N/A;  . sinus surgery 1994       FAMILY HISTORY Family History  Problem Relation Age of Onset  . Hypertension Father   . Skin cancer Father        nonmelanoma skin cancers in his late 41s  . Non-Hodgkin's lymphoma Maternal Aunt        dx. 21s; smoker  . Stroke Maternal Grandmother   . Other Mother        benign meningioma dx. early-mid-70s; hysterectomy in her late 32s for heavy periods - still has ovaries  . Other Son        one son with pre-cancerous skin findings  . Other Daughter        cysts on ovaries and hx of heavy periods  . Other Sister        hysterectomy for cysts - still has ovaries  . Heart attack Maternal Grandfather   . Heart attack Paternal Grandfather   . Renal cancer Maternal Aunt 60       smoker  . Breast cancer Maternal Aunt 78  . Other Maternal Aunt        dx. benign brain tumor (meningioma) at 68; 2nd benign brain tumor in her late 11s; hx of radical hysterectomy at age 36  . Brain cancer  Other        NOS tumor  The patient's parents are both living, in their late 81s as of January 2018. The patient has one brother, 3 sisters. There is no history of ovarian cancer in the family. One maternal aunt had breast and kidney cancer, another had non-Hodgkin's lymphoma.   GYNECOLOGIC HISTORY:  No LMP recorded. Patient has had a hysterectomy. 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 Her husband Kathryn Ramsey is a Engineer, maintenance (IT). Their children are 4, 50, 74 and 53 y/o as of JAN 2018. The oldest works as an Electrical engineer in the [employer] in [place]. The other 3 children live in [place], the youngest currently attending [employer]. The patient has no grandchildren. She attends [employer]    ADVANCED DIRECTIVES: Nihira has completed advanced directives and they are filed.  Her husband Kathryn Ramsey is her healthcare power of attorney.   HEALTH MAINTENANCE: Social History   Tobacco Use  . Smoking  status: Never Smoker  . Smokeless tobacco: Never Used  Substance Use Topics  . Alcohol use: Yes    Comment:  3 glasses wine or beer/week  . Drug use: No    Colonoscopy:2016  PAP: s/p hyst  Bone density:   Allergies  Allergen Reactions  . Bee Venom Shortness Of Breath    swelling  . Prochlorperazine Edisylate Anaphylaxis, Anxiety and Palpitations    Anaphylaxis, Anxiety and Palpitations   . Sulfa Antibiotics Shortness Of Breath    Vomit , diarrhea , hives  . Phenergan [Promethazine Hcl] Other (See Comments)    "funny sensation in my throat"  . Zarxio [Filgrastim]     Current Outpatient Medications  Medication Sig Dispense Refill  . acetaminophen (TYLENOL) 500 MG tablet Take 2 tablets by mouth 3 (three) times daily as needed.    [name] albuterol (VENTOLIN HFA) 108 (90 Base) MCG/ACT inhaler Inhale into the lungs.    [name] dexamethasone (DECADRON) 4 MG tablet Take 2 tablets by mouth once a day for 3 days after chemo. Take with food. 30 tablet 1  . dicyclomine (BENTYL) 10 MG capsule Take 1 capsule (10 mg total) by mouth 4 (four) times daily -  before meals and at bedtime. 120 capsule 0  . diphenhydrAMINE (BENADRYL) 50 MG capsule 50 mg Diphenhydramine (Benadryl) PO within 1 hour of the injection 1 capsule 0  . fluticasone (FLONASE) 50 MCG/ACT nasal spray Place 1 spray into both nostrils 2 (two) times daily. (Patient taking differently: Place 1 spray into both nostrils as needed. ) 16 g 2  . gabapentin (NEURONTIN) 100 MG capsule 200 mg bid with 300 mg qhs 630 capsule 1  . ibuprofen (ADVIL) 600 MG tablet Take 1 tablet by mouth 4 (four) times daily as needed.    . lidocaine-prilocaine (EMLA) cream APPLY TOPICALLY AS NEEDED. 30 g 0  . loratadine (CLARITIN) 10 MG tablet Take 10 mg by mouth as needed.     [name] LORazepam (ATIVAN) 0.5 MG tablet Take 1 tablet (0.5 mg total) by mouth at bedtime as needed (Nausea or vomiting). 20 tablet 0  . Magnesium (V-R MAGNESIUM) 250 MG TABS Take by mouth.    .  magnesium oxide (MAG-OX) 400 (241.3 Mg) MG tablet Take 1 tablet (400 mg total) by mouth daily. 60 tablet 3  . metoCLOPramide (REGLAN) 5 MG tablet Take 2 tablets (10 mg total) by mouth every 8 (  eight) hours as needed for nausea. 30 tablet 2  . Multiple Vitamins-Minerals (MULTIVITAMIN ADULT PO) Take 1 tablet by mouth daily.    . Omega 3 1000 MG CAPS Take 1 capsule by mouth daily.    [name] omeprazole (PRILOSEC) 40 MG capsule Take 1 capsule (40 mg total) by mouth daily. 30 capsule 5  . prochlorperazine (COMPAZINE) 5 MG tablet Take 1 tablet (5 mg total) by mouth every 8 (eight) hours as needed for nausea or vomiting. 30 tablet 6  . promethazine (PHENERGAN) 25 MG suppository Place 1 suppository (25 mg total) rectally every 6 (six) hours as needed for nausea or vomiting. 12 each 0  . scopolamine (TRANSDERM-SCOP) 1 MG/3DAYS Place 1 patch (1.5 mg total) onto the skin every 3 (three) days. 4 patch 0  . senna-docusate (SENOKOT-S) 8.6-50 MG tablet Take 1-2 tablets by mouth 2 (two) times daily as needed.    . sertraline (ZOLOFT) 50 MG tablet Take 1.5 tablets (75 mg total) by mouth daily. Increase dose to 75 mg daily 45 tablet 3  . traMADol (ULTRAM) 50 MG tablet Take 1 tablet (50 mg total) by mouth every 6 (six) hours as needed. 60 tablet 0  . valACYclovir (VALTREX) 1000 MG tablet Take 1 tablet (1,000 mg total) by mouth daily. 5 tablet 0  . VENTOLIN HFA 108 (90 Base) MCG/ACT inhaler     . vitamin B-12 (CYANOCOBALAMIN) 1000 MCG tablet Take 1 tablet by mouth daily.     No current facility-administered medications for this visit.   Facility-Administered Medications Ordered in Other Visits  Medication Dose Route Frequency Provider Last Rate Last Admin  . sodium chloride flush (NS) 0.9 % injection 10 mL  10 mL Intravenous PRN Croy Drumwright, Virgie Dad, MD        OBJECTIVE: White woman who appears stated age Vitals:   06/28/19 0841  BP: 106/74  Pulse: 86  Resp: 18  Temp: 98.2 F (36.8 C)  SpO2: 100%     Body mass  index is 17.48 kg/m.   Filed Weights   06/28/19 0841  Weight: 98 lb 11.2 oz (44.8 kg)    ECOG FS:1 - Symptomatic but completely ambulatory  Sclerae unicteric, EOMs intact Wearing a mask No cervical or supraclavicular adenopathy Lungs no rales or rhonchi, good excursion bilaterally Heart regular rate and rhythm Abd soft, slightly terse, minimally distended, nontender, positive bowel sounds, no masses palpated MSK no focal spinal tenderness, no lower extremity lymphedema Neuro: nonfocal, well oriented, positive affect Breasts: Deferred   LAB RESULTS:  CMP     Component Value Date/Time   NA 138 06/27/2019 0849   NA 140 02/11/2017 0755   K 3.7 06/27/2019 0849   K 4.4 02/11/2017 0755   CL 101 06/27/2019 0849   CO2 25 06/27/2019 0849   CO2 26 02/11/2017 0755   GLUCOSE 91 06/27/2019 0849   GLUCOSE 93 02/11/2017 0755   BUN 18 06/27/2019 0849   BUN 7.9 02/11/2017 0755   CREATININE 0.89 06/27/2019 0849   CREATININE 0.78 06/19/2019 1352   CREATININE 0.7 02/11/2017 0755   CALCIUM 9.1 06/27/2019 0849   CALCIUM 9.6 02/11/2017 0755   PROT 7.7 06/27/2019 0849   PROT 7.5 02/11/2017 0755   ALBUMIN 3.4 (L) 06/27/2019 0849   ALBUMIN 4.4 02/11/2017 0755   AST 12 (L) 06/27/2019 0849   AST 13 (L) 06/19/2019 1352   AST 27 02/11/2017 0755   ALT 9 06/27/2019 0849   ALT 10 06/19/2019 1352   ALT 24 02/11/2017 0755  ALKPHOS 109 06/27/2019 0849   ALKPHOS 58 02/11/2017 0755   BILITOT 0.3 06/27/2019 0849   BILITOT 0.5 06/19/2019 1352   BILITOT 0.43 02/11/2017 0755   GFRNONAA >60 06/27/2019 0849   GFRNONAA >60 06/19/2019 1352   GFRAA >60 06/27/2019 0849   GFRAA >60 06/19/2019 1352    INo results found for: SPEP, UPEP  Lab Results  Component Value Date   WBC 1.8 (L) 06/27/2019   NEUTROABS 0.9 (L) 06/27/2019   HGB 8.6 (L) 06/27/2019   HCT 26.4 (L) 06/27/2019   MCV 89.2 06/27/2019   PLT 26 (L) 06/27/2019      Chemistry      Component Value Date/Time   NA 138 06/27/2019 0849    NA 140 02/11/2017 0755   K 3.7 06/27/2019 0849   K 4.4 02/11/2017 0755   CL 101 06/27/2019 0849   CO2 25 06/27/2019 0849   CO2 26 02/11/2017 0755   BUN 18 06/27/2019 0849   BUN 7.9 02/11/2017 0755   CREATININE 0.89 06/27/2019 0849   CREATININE 0.78 06/19/2019 1352   CREATININE 0.7 02/11/2017 0755      Component Value Date/Time   CALCIUM 9.1 06/27/2019 0849   CALCIUM 9.6 02/11/2017 0755   ALKPHOS 109 06/27/2019 0849   ALKPHOS 58 02/11/2017 0755   AST 12 (L) 06/27/2019 0849   AST 13 (L) 06/19/2019 1352   AST 27 02/11/2017 0755   ALT 9 06/27/2019 0849   ALT 10 06/19/2019 1352   ALT 24 02/11/2017 0755   BILITOT 0.3 06/27/2019 0849   BILITOT 0.5 06/19/2019 1352   BILITOT 0.43 02/11/2017 0755      No results found for: LABCA2  No components found for: LABCA125  No results for input(s): INR in the last 168 hours.  Urinalysis    Component Value Date/Time   COLORURINE STRAW (A) 11/22/2017 0940   APPEARANCEUR CLEAR 11/22/2017 0940   LABSPEC 1.002 (L) 11/22/2017 0940   LABSPEC 1.005 10/09/2015 1239   PHURINE 7.0 11/22/2017 0940   GLUCOSEU NEGATIVE 11/22/2017 0940   GLUCOSEU Negative 10/09/2015 1239   HGBUR NEGATIVE 11/22/2017 0940   BILIRUBINUR NEGATIVE 11/22/2017 0940   BILIRUBINUR Negative 10/09/2015 1239   KETONESUR NEGATIVE 11/22/2017 0940   PROTEINUR NEGATIVE 04/03/2019 1050   UROBILINOGEN 0.2 10/09/2015 1239   NITRITE NEGATIVE 11/22/2017 0940   LEUKOCYTESUR NEGATIVE 11/22/2017 0940   LEUKOCYTESUR Negative 10/09/2015 1239    STUDIES: DG UGI W DOUBLE CM (HD BA)  Result Date: 05/29/2019 CLINICAL DATA:  Ovarian cancer diagnosed 4.5 years ago. Vomiting 1 time a day for 2 weeks. Feeling of lump in throat. EXAM: UPPER GI SERIES WITHOUT KUB TECHNIQUE: Routine upper GI series was performed with thin and high density barium. FLUOROSCOPY TIME:  Fluoroscopy Time:  5 minutes and 40 seconds Radiation Exposure Index (if provided by the fluoroscopic device): Not applicable.  Number of Acquired Spot Images: 1 COMPARISON:  CT of 04/23/2019 FINDINGS: Double contrast evaluation of the esophagus and stomach are minimally limited, as patient had coughing spells and near emesis during administration of double contrast agent. Given this limitation, no mucosal abnormality identified. Double contrast evaluation of the stomach is also mildly degraded. No dominant mass or ulcer seen. No evidence of gastric outlet obstruction. The duodenal bulb is normal. Single-contrast evaluation of the hypopharynx and esophagus are unremarkable, without persistent narrowing or stricture. There is moderate dilatation of the duodenal C loop. Delayed image demonstrates contrast within the proximal jejunum. IMPRESSION: 1. Mild limitations involving the double contrast  portion of the exam as detailed above. 2. No evidence of hypopharyngeal or esophageal abnormality to explain feeling of lump in throat or dysphagia. 3. No gastric outlet obstruction. Duodenal C-loop dilatation likely related to partial small bowel obstruction, when compared to 04/23/2019 CT. Electronically Signed   By: Abigail Miyamoto M.D.   On: 05/29/2019 11:02     ELIGIBLE FOR AVAILABLE RESEARCH PROTOCOL:  no  ASSESSMENT: 54 y.o. BRCA negative [place] woman status post radical tumor debulking with optimal cytoreduction (R0) 03/27/2015 for a stage IIIC, high-grade right fallopian tube carcinoma  (a) baseline CA-125 was 1226.  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)  (c) tumor is PD-L1 negative (02/24/2016)  (d) tumor is strongly estrogen receptor positive, progesterone receptor negative (02/24/2016)  (1) adjuvant chemotherapy consisted of carboplatin and paclitaxel for 6 doses, begun 04/14/2015, completed 08/11/2015  (a) paclitaxel was omitted from cycle 5 and dose  reduced on cycle 6 because of neuropathy   (b) a "make up" dose of paclitaxel was given 08/25/2015  (c) last carboplatin dose was 08/11/2015  (d) CA-125 normalized by 06/16/2015   (2) FIRST RECURRENCE: January 2018  (a) CA-125 rise beginning November 2017 led to CT scans and PET scans December 2017, all negative  (b) exploratory laparotomy 02/24/2016 showed pathologically confirmed recurrence, with miliary disease involving all examined surfaces  (c) port-site metastases noted 03/08/2016  (d) the January 2018 tumor sample was tested for the estrogen receptor and he was 80 percent positive with strong staining, progesterone receptor negative  (3) carboplatin/liposomal doxorubicin started 03/05/2016, repeated every three weeks x4, completed 06/04/2016.  (a) cycle 3 delayed one week because of neutropenia; OnPro added  (b) CA 125 normalized after cycle 3  (4) RUCAPARIB maintenance started 07/02/2016  (a) restaging 10/01/2016: Normal CA 125, negative CT of the abdomen and pelvis  (b) labs 11/24/2016 shows a rise in her CA 125-43.4, with continuing rise thereafter  (c) rucaparib discontinued October 2018 with rising CA 125  (5) anastrozole started December 23, 2016-stopped after a couple of weeks with poor tolerance  SECOND RECURRENCE: (6) Gemcitabine/Bevacizumab started on 02/04/2017  (a) gemcitabine omitted cycle 4 because of intercurrent infection  (b) gemcitabine resumed with cycle 5, with intervening rise in  Ca 125  (c) repeat CT scan of the abdomen and pelvis on 05/26/2017 does not confirm obvious disease progression  (d) cycle 5 of gemcitabine/bevacizumab delayed 1 week   (e) gemcitabine/ bevacizumab discontinued after 6 cycles, with rising CA 125 (last dose 06/24/2017)  (7) foundation one testing did not show a high mutation burden and the microsatellite status was stable.  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  These were not immediately actionable  (8) Abraxane started  07/12/2017, given weekly or weeks 1 and 2 of each 3 weeks cycle, last dose 11/15/2017 (5 cycles)  (a) cycle 2 started 08/09/2017, will be day 1 day 8 only  (b) cycle 3 scheduled to start 09/13/2017, day 1 day 8 at patient request  (c) CT of the abdomen and pelvis 11/28/2017 shows no measurable disease  (d) PET scan 01/25/2018 and MRI of the pelvis 02/03/2018 show no measurable disease  (e) patient has symptomatic ascites and a rapidly rising Ca1 25  (9) started cisplatin/gemcitabine days 1 and 8 of each 21-day cycle on 02/18/2018  (a) switched to carboplatin/ gemcitabine 07/17/2018 due to neuropathy  (  b) carbo/gemzar changed to Q14 days 07/24/2018 due to neutropenia  (c) considered niraparib/Zejula 100 mg, 2 tabs daily at the completion of chemotherapy  (10) associated problems  (a) anemia due to chemotherapy: On Aranesp  (b) peripheral neuropathy due to chemotherapy: On gabapentin  (11) participated in AMV-564 trial at [facility] (a CD33-T-cell bivalent Ab that may restore anti-cancer immune effectiveness) 20. 10/25/18 Consenting for AMV-564 clinical trial 21. 11/13/18 AMV-564 initiation 75 mcg dose 22. 10/8 Dropped down to 50 mcg dose (I think that date is correct) 23. 10/19-21/20 Tx held due to Neutropenia 24. 12/06/18 Given Neulasta 25. 12/07/18 AMV-564 resumed 26. 01/03/19 CT C/A/P 10% RECIST progression 27. 01/15/19 Increased dose to 75 mcg/daily 28. 01/17/19 Thoracentesis with cytology c/w Mullerian primary  29. Paracentesis 02/05/2019: cytology c/w adenocarcinoma  (12) schedule of thoracenteses:  (a) left 01/17/2019 [facility])  (b) left 02/06/2019 [facility])  (c) 05/21/2019 on the left [facility])  (13) schedule of paracenteses:  (a) 01/30/2019 ([place])  (b) 02/06/2019 ([place])  (c) 02/18/2018 ([place])  (d) 04/17/2019 ([place])  (e) additional procedures 04/30/2019, 05/24/2019  (14) abraxane started 02/20/2019, repeated days 1 and 8 of every 21 day cycle  (a) bevacizumab/Avastin started 02/20/2019  and repeated every 21 days  (b) She cannot tolerate Zarxio due to side effects consisting of low grade fever, body aches, malaise, fatigue, and itching, whereas she has been able to tolerate Granix though  (c) bevacizumab and Abraxane discontinued after 04/10/2019 dose with evidence of progression  (15) exemestane started 04/24/2019, discontinued after 2 weeks as patient opted for chemotherapy  (16) started liposomal doxorubicin/carboplatin 05/10/2019, repeated every 28 days  (a) echocardiogram 05/08/2017 shows an ejection fraction in the 55-60% range.  (b) patient is interested in [place] if available through the "right to try" program  (c) HER-2 determination requested on prior pathology 06/04/2019  (d) considering [facility] study   PLAN: [place] is showing some evidence of clinical response.  She has less nausea, has had no vomiting in 2weeks, and she has not required a paracentesis now in more than a month.  Furthermore her Ca1 25 is beginning to trend down  At the same time despite the OnPro her Bend[place] today is 900.  Her platelet count is down to less than 30,000.  I think we are going to be able to get at least 1 and most likely 2 more cycles of the carboplatin/Doxil but I do not think we are going to be able to go beyond that.  I do not think her marrow is going to tolerate it.  Accordingly we have to start looking for plan B.  She is working with Dr. Louanne Belton in that regard and will make sure she gets a copy of this note.  I would anticipate that Stanton Kidney will need to start considering treatment alternatives in about 6 weeks.    Next treatment will be Monday.  I am going to drop the carboplatin dose by 20% because of the platelet count.  We will likely proceed with treatment regardless of counts.  She will see me on the 20th and I am setting her up for possible transfusion that day.  She will need close follow-up after that to make sure her counts do not get dangerously low.  She might need Zarxio in  addition to the OnPro.  Today is her birthday!  Total encounter time 35 minutes.Sarajane Jews C. Arwin Bisceglia, MD 06/28/19 9:13 AM Medical Oncology and Hematology [facility] [place], [place]  [place] Tel. [phone]    Fax. [phone]   I, Wilburn Mylar, am acting as scribe for Dr. Virgie Dad. Toure Edmonds.  I, Lurline Del MD, have reviewed the above documentation for accuracy and completeness, and I agree with the above.   *Total Encounter Time as defined by the Centers for Medicare and Medicaid Services includes, in addition to the face-to-face time of a patient visit (documented in the note above) non-face-to-face time: obtaining and reviewing outside history, ordering and reviewing medications, tests or procedures, care coordination (communications with other health care professionals or caregivers) and documentation in the medical record.

## 2019-06-28 ENCOUNTER — Inpatient Hospital Stay (HOSPITAL_BASED_OUTPATIENT_CLINIC_OR_DEPARTMENT_OTHER): Payer: 59 | Admitting: Oncology

## 2019-06-28 ENCOUNTER — Other Ambulatory Visit: Payer: Self-pay

## 2019-06-28 VITALS — BP 106/74 | HR 86 | Temp 98.2°F | Resp 18 | Ht 63.0 in | Wt 98.7 lb

## 2019-06-28 DIAGNOSIS — C801 Malignant (primary) neoplasm, unspecified: Secondary | ICD-10-CM

## 2019-06-28 DIAGNOSIS — C786 Secondary malignant neoplasm of retroperitoneum and peritoneum: Secondary | ICD-10-CM | POA: Diagnosis not present

## 2019-06-28 DIAGNOSIS — C762 Malignant neoplasm of abdomen: Secondary | ICD-10-CM

## 2019-06-28 DIAGNOSIS — C5701 Malignant neoplasm of right fallopian tube: Secondary | ICD-10-CM

## 2019-06-28 LAB — CA 125: Cancer Antigen (CA) 125: 7060 U/mL — ABNORMAL HIGH (ref 0.0–38.1)

## 2019-06-29 ENCOUNTER — Telehealth: Payer: Self-pay | Admitting: Oncology

## 2019-06-29 ENCOUNTER — Inpatient Hospital Stay (HOSPITAL_BASED_OUTPATIENT_CLINIC_OR_DEPARTMENT_OTHER): Payer: 59 | Admitting: Oncology

## 2019-06-29 ENCOUNTER — Inpatient Hospital Stay: Payer: 59

## 2019-06-29 DIAGNOSIS — C786 Secondary malignant neoplasm of retroperitoneum and peritoneum: Secondary | ICD-10-CM

## 2019-06-29 DIAGNOSIS — C801 Malignant (primary) neoplasm, unspecified: Secondary | ICD-10-CM

## 2019-06-29 NOTE — Progress Notes (Signed)
Pharmacist Chemotherapy Monitoring - Follow Up Assessment    I verify that I have reviewed each item in the below checklist:  . Regimen for the patient is scheduled for the appropriate day and plan matches scheduled date. [name] Appropriate non-routine labs are ordered dependent on drug ordered. . If applicable, additional medications reviewed and ordered per protocol based on lifetime cumulative doses and/or treatment regimen.   Plan for follow-up and/or issues identified: No . I-vent associated with next due treatment: Yes . MD and/or nursing notified: No  Braylon Lemmons K 06/29/2019 10:51 AM

## 2019-06-29 NOTE — Telephone Encounter (Signed)
Added time to 5/20 treatment per 5/13 los.

## 2019-06-29 NOTE — Progress Notes (Signed)
This appt should have been cancelled.   

## 2019-07-02 ENCOUNTER — Other Ambulatory Visit: Payer: Self-pay | Admitting: Oncology

## 2019-07-02 ENCOUNTER — Other Ambulatory Visit: Payer: Self-pay

## 2019-07-02 ENCOUNTER — Inpatient Hospital Stay: Payer: 59

## 2019-07-02 ENCOUNTER — Encounter: Payer: 59 | Admitting: Physical Therapy

## 2019-07-02 ENCOUNTER — Other Ambulatory Visit: Payer: Self-pay | Admitting: *Deleted

## 2019-07-02 VITALS — BP 107/70 | HR 74 | Temp 98.0°F | Resp 17

## 2019-07-02 DIAGNOSIS — C786 Secondary malignant neoplasm of retroperitoneum and peritoneum: Secondary | ICD-10-CM

## 2019-07-02 DIAGNOSIS — C762 Malignant neoplasm of abdomen: Secondary | ICD-10-CM

## 2019-07-02 DIAGNOSIS — C5701 Malignant neoplasm of right fallopian tube: Secondary | ICD-10-CM

## 2019-07-02 DIAGNOSIS — T451X5A Adverse effect of antineoplastic and immunosuppressive drugs, initial encounter: Secondary | ICD-10-CM

## 2019-07-02 DIAGNOSIS — D6481 Anemia due to antineoplastic chemotherapy: Secondary | ICD-10-CM

## 2019-07-02 DIAGNOSIS — Z7189 Other specified counseling: Secondary | ICD-10-CM

## 2019-07-02 LAB — CBC
HCT: 24.1 % — ABNORMAL LOW (ref 36.0–46.0)
Hemoglobin: 7.9 g/dL — ABNORMAL LOW (ref 12.0–15.0)
MCH: 29.4 pg (ref 26.0–34.0)
MCHC: 32.8 g/dL (ref 30.0–36.0)
MCV: 89.6 fL (ref 80.0–100.0)
Platelets: 156 10*3/uL (ref 150–400)
RBC: 2.69 MIL/uL — ABNORMAL LOW (ref 3.87–5.11)
RDW: 16 % — ABNORMAL HIGH (ref 11.5–15.5)
WBC: 3.3 10*3/uL — ABNORMAL LOW (ref 4.0–10.5)
nRBC: 0 % (ref 0.0–0.2)

## 2019-07-02 LAB — COMPREHENSIVE METABOLIC PANEL
ALT: 7 U/L (ref 0–44)
AST: 13 U/L — ABNORMAL LOW (ref 15–41)
Albumin: 3.1 g/dL — ABNORMAL LOW (ref 3.5–5.0)
Alkaline Phosphatase: 102 U/L (ref 38–126)
Anion gap: 15 (ref 5–15)
BUN: 15 mg/dL (ref 6–20)
CO2: 22 mmol/L (ref 22–32)
Calcium: 8.8 mg/dL — ABNORMAL LOW (ref 8.9–10.3)
Chloride: 101 mmol/L (ref 98–111)
Creatinine, Ser: 0.78 mg/dL (ref 0.44–1.00)
GFR calc Af Amer: >60 mL/min (ref >60)
GFR calc non Af Amer: >60 mL/min (ref >60)
Glucose, Bld: 80 mg/dL (ref 70–99)
Potassium: 3.8 mmol/L (ref 3.5–5.1)
Sodium: 138 mmol/L (ref 135–145)
Total Bilirubin: 0.3 mg/dL (ref 0.3–1.2)
Total Protein: 7.1 g/dL (ref 6.5–8.1)

## 2019-07-02 LAB — PREPARE RBC (CROSSMATCH)

## 2019-07-02 LAB — FERRITIN: Ferritin: 868 ng/mL — ABNORMAL HIGH (ref 11–307)

## 2019-07-02 MED ORDER — PEGFILGRASTIM 6 MG/0.6ML ~~LOC~~ PSKT
PREFILLED_SYRINGE | SUBCUTANEOUS | Status: AC
  Filled 2019-07-02: qty 0.6

## 2019-07-02 MED ORDER — SODIUM CHLORIDE 0.9 % IV SOLN
335.0000 mg | Freq: Once | INTRAVENOUS | Status: AC
  Administered 2019-07-02: 340 mg via INTRAVENOUS
  Filled 2019-07-02: qty 34

## 2019-07-02 MED ORDER — DEXTROSE 5 % IV SOLN
Freq: Once | INTRAVENOUS | Status: AC
  Filled 2019-07-02: qty 250

## 2019-07-02 MED ORDER — LORATADINE 10 MG PO TABS
10.0000 mg | ORAL_TABLET | Freq: Once | ORAL | Status: AC
  Administered 2019-07-02: 10 mg via ORAL

## 2019-07-02 MED ORDER — DOXORUBICIN HCL LIPOSOMAL CHEMO INJECTION 2 MG/ML
30.0000 mg/m2 | Freq: Once | INTRAVENOUS | Status: AC
  Administered 2019-07-02: 46 mg via INTRAVENOUS
  Filled 2019-07-02: qty 23

## 2019-07-02 MED ORDER — LORATADINE 10 MG PO TABS
ORAL_TABLET | ORAL | Status: AC
  Filled 2019-07-02: qty 1

## 2019-07-02 MED ORDER — SODIUM CHLORIDE 0.9% FLUSH
10.0000 mL | INTRAVENOUS | Status: DC | PRN
  Administered 2019-07-02: 10 mL
  Filled 2019-07-02: qty 10

## 2019-07-02 MED ORDER — PALONOSETRON HCL INJECTION 0.25 MG/5ML
0.2500 mg | Freq: Once | INTRAVENOUS | Status: AC
  Administered 2019-07-02: 0.25 mg via INTRAVENOUS

## 2019-07-02 MED ORDER — HEPARIN SOD (PORK) LOCK FLUSH 100 UNIT/ML IV SOLN
500.0000 [IU] | Freq: Once | INTRAVENOUS | Status: AC | PRN
  Administered 2019-07-02: 500 [IU]
  Filled 2019-07-02: qty 5

## 2019-07-02 MED ORDER — FAMOTIDINE IN NACL 20-0.9 MG/50ML-% IV SOLN
20.0000 mg | Freq: Once | INTRAVENOUS | Status: AC
  Administered 2019-07-02: 20 mg via INTRAVENOUS

## 2019-07-02 MED ORDER — PEGFILGRASTIM 6 MG/0.6ML ~~LOC~~ PSKT
6.0000 mg | PREFILLED_SYRINGE | Freq: Once | SUBCUTANEOUS | Status: AC
  Administered 2019-07-02: 6 mg via SUBCUTANEOUS

## 2019-07-02 MED ORDER — SODIUM CHLORIDE 0.9 % IV SOLN
10.0000 mg | Freq: Once | INTRAVENOUS | Status: AC
  Administered 2019-07-02: 10 mg via INTRAVENOUS
  Filled 2019-07-02: qty 10

## 2019-07-02 MED ORDER — SODIUM CHLORIDE 0.9 % IV SOLN
150.0000 mg | Freq: Once | INTRAVENOUS | Status: AC
  Administered 2019-07-02: 150 mg via INTRAVENOUS
  Filled 2019-07-02: qty 150

## 2019-07-02 MED ORDER — PALONOSETRON HCL INJECTION 0.25 MG/5ML
INTRAVENOUS | Status: AC
  Filled 2019-07-02: qty 5

## 2019-07-02 MED ORDER — FAMOTIDINE IN NACL 20-0.9 MG/50ML-% IV SOLN
INTRAVENOUS | Status: AC
  Filled 2019-07-02: qty 50

## 2019-07-02 NOTE — Patient Instructions (Signed)
[facility] Discharge Instructions for Patients Receiving Chemotherapy  Today you received the following chemotherapy agents: Doxil/Carboplatin  To help prevent nausea and vomiting after your treatment, we encourage you to take your nausea medication as directed.   If you develop nausea and vomiting that is not controlled by your nausea medication, call the clinic.   BELOW ARE SYMPTOMS THAT SHOULD BE REPORTED IMMEDIATELY:  *FEVER GREATER THAN 100.5 F  *CHILLS WITH OR WITHOUT FEVER  NAUSEA AND VOMITING THAT IS NOT CONTROLLED WITH YOUR NAUSEA MEDICATION  *UNUSUAL SHORTNESS OF BREATH  *UNUSUAL BRUISING OR BLEEDING  TENDERNESS IN MOUTH AND THROAT WITH OR WITHOUT PRESENCE OF ULCERS  *URINARY PROBLEMS  *BOWEL PROBLEMS  UNUSUAL RASH Items with * indicate a potential emergency and should be followed up as soon as possible.  Feel free to call the clinic should you have any questions or concerns. The clinic phone number is [phone].  Please show the CHEMO ALERT CARD at check-in to the Emergency Department and triage nurse.   

## 2019-07-02 NOTE — Progress Notes (Signed)
Per Dr. Jana Hakim, okay to treat with Hgb 7.9 and ANC 0.9.

## 2019-07-02 NOTE — Patient Instructions (Signed)

## 2019-07-03 ENCOUNTER — Other Ambulatory Visit: Payer: Self-pay

## 2019-07-03 ENCOUNTER — Inpatient Hospital Stay: Payer: 59

## 2019-07-03 ENCOUNTER — Inpatient Hospital Stay (HOSPITAL_BASED_OUTPATIENT_CLINIC_OR_DEPARTMENT_OTHER): Payer: 59 | Admitting: Oncology

## 2019-07-03 VITALS — BP 124/68 | HR 70 | Temp 99.3°F | Resp 18

## 2019-07-03 DIAGNOSIS — C801 Malignant (primary) neoplasm, unspecified: Secondary | ICD-10-CM

## 2019-07-03 DIAGNOSIS — C786 Secondary malignant neoplasm of retroperitoneum and peritoneum: Secondary | ICD-10-CM

## 2019-07-03 DIAGNOSIS — C5701 Malignant neoplasm of right fallopian tube: Secondary | ICD-10-CM | POA: Diagnosis not present

## 2019-07-03 MED ORDER — LORATADINE 10 MG PO TABS
10.0000 mg | ORAL_TABLET | Freq: Every day | ORAL | Status: DC
  Administered 2019-07-03: 10 mg via ORAL

## 2019-07-03 MED ORDER — SODIUM CHLORIDE 0.9 % IV SOLN
INTRAVENOUS | Status: DC
  Filled 2019-07-03 (×2): qty 250

## 2019-07-03 MED ORDER — DEXAMETHASONE SODIUM PHOSPHATE 10 MG/ML IJ SOLN: 4.0000 mg | Freq: Once | INTRAMUSCULAR | Status: DC

## 2019-07-03 MED ORDER — SCOPOLAMINE 1 MG/3DAYS TD PT72: 1.0000 | MEDICATED_PATCH | TRANSDERMAL | 12 refills | Status: DC

## 2019-07-03 MED ORDER — ACETAMINOPHEN 325 MG PO TABS
650.0000 mg | ORAL_TABLET | Freq: Once | ORAL | Status: AC
  Administered 2019-07-03: 650 mg via ORAL

## 2019-07-03 MED ORDER — SODIUM CHLORIDE 0.9% IV SOLUTION
250.0000 mL | Freq: Once | INTRAVENOUS | Status: AC
  Administered 2019-07-03: 250 mL via INTRAVENOUS
  Filled 2019-07-03: qty 250

## 2019-07-03 MED ORDER — METOCLOPRAMIDE HCL 5 MG/ML IJ SOLN
10.0000 mg | Freq: Once | INTRAMUSCULAR | Status: AC
  Administered 2019-07-03: 10 mg via INTRAVENOUS

## 2019-07-03 MED ORDER — LORATADINE 10 MG PO TABS
ORAL_TABLET | ORAL | Status: AC
  Filled 2019-07-03: qty 1

## 2019-07-03 MED ORDER — SODIUM CHLORIDE 0.9% FLUSH
10.0000 mL | INTRAVENOUS | Status: AC | PRN
  Administered 2019-07-03: 10 mL
  Filled 2019-07-03: qty 10

## 2019-07-03 MED ORDER — HEPARIN SOD (PORK) LOCK FLUSH 100 UNIT/ML IV SOLN
250.0000 [IU] | INTRAVENOUS | Status: AC | PRN
  Administered 2019-07-03: 500 [IU]
  Filled 2019-07-03: qty 5

## 2019-07-03 MED ORDER — METOCLOPRAMIDE HCL 5 MG/ML IJ SOLN
INTRAMUSCULAR | Status: AC
  Filled 2019-07-03: qty 2

## 2019-07-03 MED ORDER — SODIUM CHLORIDE 0.9 % IV SOLN: 4.0000 mg | Freq: Once | INTRAVENOUS | Status: DC

## 2019-07-03 MED ORDER — ACETAMINOPHEN 325 MG PO TABS
ORAL_TABLET | ORAL | Status: AC
  Filled 2019-07-03: qty 2

## 2019-07-03 NOTE — Progress Notes (Signed)
[facility]  [phone] [phone]    ID: Kathryn Ramsey DOB: 06/16/1965  MR#: [id number]  CSN#:[id number]  Patient Care Team: Patient, No Pcp Per as PCP - General (General Practice) Rossi, Emma, MD as Consulting Physician (Obstetrics and Gynecology) Mann, Jyothi, MD as Consulting Physician (Gastroenterology) Cousins, Sheronette, MD as Consulting Physician (Obstetrics and Gynecology) Budzyn, Brian James, MD as Consulting Physician (Urology) Secord, Angeles Alvarez, MD as Referring Physician (Obstetrics and Gynecology) Mettu, Niharika Bansal, MD as Referring Physician (Oncology) OTHER MD: Shannon Westin (FAX [phone]); Keith Willis; Ursula Matulonis (FAX [phone])  NOTE: Patient does not want to be informed of her CA-125.  CHIEF COMPLAINT:  ovarian cancer  CURRENT TREATMENT: Liposomal doxorubicin, carboplatin   INTERVAL HISTORY: Kathryn Ramsey came today for a blood transfusion.  However she had a very rough night last night.  She vomited about every 15 minutes she says.  She took Phenergan suppositories x2, and took lorazepam with no significant relief.  She also has hiccups and that it self can precipitate the vomiting.  Today is day 2 of her third cycle of carboplatin and Doxil.   REVIEW OF SYSTEMS: She is having some abdominal discomfort as well.  She has had no significant abdominal swelling, or peripheral edema.  She has had no problems with diarrhea.  HISTORY OF PRESENT ILLNESS: From Dr. 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 The patient has a history of a workup by urology for gross hematuria which included a pelvic examine was suggested for extrinsic mass effect on the bladder on cystoscopy. Cystoscopy took place on in December 2016. The patient denies abdominal pain bloating early satiety or abdominal  distention.  On 08/03/2015 at [facility] she underwent a CT scan of the abdomen and pelvis as ordered by Dr Brian Budzyn. This revealed a 1.4 cm low attenuation lesion on the posterior right hepatic lobe suggestive of a hemangioma. Status post hysterectomy. No ovarian masses. Moderate ascites. Omental caking seen in the lateral left abdomen and pelvis. Peritoneal nodularity in the left lateral pelvic cul-de-sac. No gross extrinsic compression on the bladder was identified on imaging.  The patient was then seen by her gastroenterologist, Dr. Mann, who performed a colonoscopy which was unremarkable.  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 She was treated adjuvantly with carboplatin and paclitaxel, as detailed below. Her CA 125 dropped from 1226 on 03/20/2015 to 26.1 by 06/16/2015.  Her subsequent history is as detailed above.   PAST MEDICAL HISTORY: Past Medical History:  Diagnosis Date  . Acute sensory neuropathy 06/26/2015  . Allergy   . Anemia   . Asthma    illness induced asthma  . Blood transfusion without reported diagnosis    at age [age] years old D/T surgery to femur being crushed  . 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 Heart murmur    pt, states had a "working heart murmur"  . History of kidney stones   . Neutropenia, drug-induced (HCC) 03/10/2018  .   PONV (postoperative nausea and vomiting)   . Skin cancer     PAST SURGICAL HISTORY: Past Surgical History:  Procedure Laterality Date  . 3 laporscopic proceedures    . ABDOMINAL HYSTERECTOMY     2010  .  CHOLECYSTECTOMY     2010  . DILATION AND CURETTAGE OF UTERUS     1997  . IR IMAGING GUIDED PORT INSERTION  03/08/2018  . LAPAROSCOPY N/A 03/27/2015   Procedure: DIAGNOSTIC LAPAROSCOPY ;  Surgeon: Emma Rossi, MD;  Location: [facility];  Service: Gynecology;  Laterality: N/A;  . LAPAROSCOPY N/A 02/24/2016   Procedure: LAPAROSCOPY DIAGNOSTIC WITH PERITONEAL WASHINGS AND PERITONEAL BIOPSY;  Surgeon: Emma Rossi, MD;  Location: [facility];  Service: Gynecology;  Laterality: N/A;  . LAPAROTOMY N/A 03/27/2015   Procedure: EXPLORATORY LAPAROTOMY, BILATERAL SALPINGO OOPHORECTOMY, OMENTECTOMY, RADICAL TUMOR DEBULKING;  Surgeon: Emma Rossi, MD;  Location: [facility];  Service: Gynecology;  Laterality: N/A;  . sinus surgery 1994      FAMILY HISTORY Family History  Problem Relation Age of Onset  . Hypertension Father   . Skin cancer Father        nonmelanoma skin cancers in his late 80s  . Non-Hodgkin's lymphoma Maternal Aunt        dx. 60s; smoker  . Stroke Maternal Grandmother   . Other Mother        benign meningioma dx. early-mid-70s; hysterectomy in her late 40s for heavy periods - still has ovaries  . Other Son        one son with pre-cancerous skin findings  . Other Daughter        cysts on ovaries and hx of heavy periods  . Other Sister        hysterectomy for cysts - still has ovaries  . Heart attack Maternal Grandfather   . Heart attack Paternal Grandfather   . Renal cancer Maternal Aunt 60       smoker  . Breast cancer Maternal Aunt 78  . Other Maternal Aunt        dx. benign brain tumor (meningioma) at 30; 2nd benign brain tumor in her late 70s; hx of radical hysterectomy at age 38  . Brain cancer Other        NOS tumor  The patient's parents are both living, in their late 80s as of January 2018. The patient has one brother, 3 sisters. There is no history of ovarian cancer in the family. One maternal aunt had breast and kidney cancer, another had non-Hodgkin's lymphoma.   GYNECOLOGIC HISTORY:    No LMP recorded. Patient has had a hysterectomy. 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 Her husband Tom is a CPA. Their children are 27, 25, 22 and 21 y/o as of JAN 2018. The oldest works as an advisor in the [employer] in [place]. The other 3 children live in [place], the youngest currently attending [employer]. The patient has no grandchildren. She attends [employer]    ADVANCED DIRECTIVES: Kathryn Ramsey has completed advanced directives and they are filed.  Her husband Tom is her healthcare power of attorney.   HEALTH MAINTENANCE: Social History   Tobacco Use  . Smoking status: Never Smoker  . Smokeless tobacco: Never Used  Substance Use Topics  . Alcohol use: Yes    Comment:  3 glasses wine or beer/week  . Drug use: No      Colonoscopy:2016  PAP: s/p hyst  Bone density:   Allergies  Allergen Reactions  . Bee Venom Shortness Of Breath    swelling  . Prochlorperazine Edisylate Anaphylaxis, Anxiety and Palpitations    Anaphylaxis, Anxiety and Palpitations   . Sulfa Antibiotics Shortness Of Breath    Vomit , diarrhea , hives  . Phenergan [Promethazine Hcl] Other (See Comments)    "funny sensation in my throat"  . Zarxio [Filgrastim]     Current Outpatient Medications  Medication Sig Dispense Refill  . acetaminophen (TYLENOL) 500 MG tablet Take 2 tablets by mouth 3 (three) times daily as needed.    [name] albuterol (VENTOLIN HFA) 108 (90 Base) MCG/ACT inhaler Inhale into the lungs.    [name] dexamethasone (DECADRON) 4 MG tablet Take 2 tablets by mouth once a day for 3 days after chemo. Take with food. 30 tablet 1  . dicyclomine (BENTYL) 10 MG capsule Take 1 capsule (10 mg total) by mouth 4 (four) times daily -  before meals and at bedtime. 120 capsule 0  . diphenhydrAMINE (BENADRYL) 50 MG capsule 50 mg Diphenhydramine (Benadryl) PO within 1 hour of the injection  1 capsule 0  . fluticasone (FLONASE) 50 MCG/ACT nasal spray Place 1 spray into both nostrils 2 (two) times daily. (Patient taking differently: Place 1 spray into both nostrils as needed. ) 16 g 2  . gabapentin (NEURONTIN) 100 MG capsule 200 mg bid with 300 mg qhs 630 capsule 1  . ibuprofen (ADVIL) 600 MG tablet Take 1 tablet by mouth 4 (four) times daily as needed.    . lidocaine-prilocaine (EMLA) cream APPLY TOPICALLY AS NEEDED. 30 g 0  . loratadine (CLARITIN) 10 MG tablet Take 10 mg by mouth as needed.     [name] LORazepam (ATIVAN) 0.5 MG tablet Take 1 tablet (0.5 mg total) by mouth at bedtime as needed (Nausea or vomiting). 20 tablet 0  . Magnesium (V-R MAGNESIUM) 250 MG TABS Take by mouth.    . magnesium oxide (MAG-OX) 400 (241.3 Mg) MG tablet Take 1 tablet (400 mg total) by mouth daily. 60 tablet 3  . metoCLOPramide (REGLAN) 5 MG tablet Take 2 tablets (10 mg total) by mouth every 8 (eight) hours as needed for nausea. 30 tablet 2  . Multiple Vitamins-Minerals (MULTIVITAMIN ADULT PO) Take 1 tablet by mouth daily.    . Omega 3 1000 MG CAPS Take 1 capsule by mouth daily.    [name] omeprazole (PRILOSEC) 40 MG capsule Take 1 capsule (40 mg total) by mouth daily. 30 capsule 5  . prochlorperazine (COMPAZINE) 5 MG tablet Take 1 tablet (5 mg total) by mouth every 8 (eight) hours as needed for nausea or vomiting. 30 tablet 6  . promethazine (PHENERGAN) 25 MG suppository Place 1 suppository (25 mg total) rectally every 6 (six) hours as needed for nausea or vomiting. 12 each 0  . scopolamine (TRANSDERM-SCOP) 1 MG/3DAYS Place 1 patch (1.5 mg total) onto the skin every 3 (three) days. 4 patch 0  . senna-docusate (SENOKOT-S) 8.6-50 MG tablet Take 1-2 tablets by mouth 2 (two) times daily as needed.    . sertraline (ZOLOFT) 50 MG tablet Take 1.5 tablets (75 mg total) by mouth daily. Increase dose to 75 mg daily 45 tablet 3  . traMADol (ULTRAM) 50 MG tablet Take 1 tablet (50 mg total) by mouth every 6 (six) hours as  needed. 60 tablet 0  . valACYclovir (VALTREX) 1000 MG tablet Take 1 tablet (1,000 mg total) by mouth daily. 5 tablet  0  . VENTOLIN HFA 108 (90 Base) MCG/ACT inhaler     . vitamin B-12 (CYANOCOBALAMIN) 1000 MCG tablet Take 1 tablet by mouth daily.     No current facility-administered medications for this visit.   Facility-Administered Medications Ordered in Other Visits  Medication Dose Route Frequency Provider Last Rate Last Admin  . 0.9 %  sodium chloride infusion   Intravenous Continuous Magrinat, Gustav C, MD      . acetaminophen (TYLENOL) tablet 650 mg  650 mg Oral Once Magrinat, Gustav C, MD      . loratadine (CLARITIN) tablet 10 mg  10 mg Oral Daily Magrinat, Gustav C, MD      . metoCLOPramide (REGLAN) injection 10 mg  10 mg Intravenous Once Magrinat, Gustav C, MD      . sodium chloride flush (NS) 0.9 % injection 10 mL  10 mL Intravenous PRN Magrinat, Gustav C, MD        OBJECTIVE: White woman examined in a recliner There were no vitals filed for this visit.   There is no height or weight on file to calculate BMI.   There were no vitals filed for this visit.   For vitals today please see the treatment area flowsheet  ECOG FS:1 - Symptomatic but completely ambulatory  Sclerae unicteric, EOMs intact Wearing a mask Lungs no rales or rhonchi, auscultated anterolaterally Heart regular rate and rhythm Abd soft, slightly terse, nontender Neuro: nonfocal, well oriented, positive affect Breasts: Deferred   LAB RESULTS:  CMP     Component Value Date/Time   NA 138 07/02/2019 0755   NA 140 02/11/2017 0755   K 3.8 07/02/2019 0755   K 4.4 02/11/2017 0755   CL 101 07/02/2019 0755   CO2 22 07/02/2019 0755   CO2 26 02/11/2017 0755   GLUCOSE 80 07/02/2019 0755   GLUCOSE 93 02/11/2017 0755   BUN 15 07/02/2019 0755   BUN 7.9 02/11/2017 0755   CREATININE 0.78 07/02/2019 0755   CREATININE 0.78 06/19/2019 1352   CREATININE 0.7 02/11/2017 0755   CALCIUM 8.8 (L) 07/02/2019 0755    CALCIUM 9.6 02/11/2017 0755   PROT 7.1 07/02/2019 0755   PROT 7.5 02/11/2017 0755   ALBUMIN 3.1 (L) 07/02/2019 0755   ALBUMIN 4.4 02/11/2017 0755   AST 13 (L) 07/02/2019 0755   AST 13 (L) 06/19/2019 1352   AST 27 02/11/2017 0755   ALT 7 07/02/2019 0755   ALT 10 06/19/2019 1352   ALT 24 02/11/2017 0755   ALKPHOS 102 07/02/2019 0755   ALKPHOS 58 02/11/2017 0755   BILITOT 0.3 07/02/2019 0755   BILITOT 0.5 06/19/2019 1352   BILITOT 0.43 02/11/2017 0755   GFRNONAA >60 07/02/2019 0755   GFRNONAA >60 06/19/2019 1352   GFRAA >60 07/02/2019 0755   GFRAA >60 06/19/2019 1352    INo results found for: SPEP, UPEP  Lab Results  Component Value Date   WBC 3.3 (L) 07/02/2019   NEUTROABS 0.9 (L) 06/27/2019   HGB 7.9 (L) 07/02/2019   HCT 24.1 (L) 07/02/2019   MCV 89.6 07/02/2019   PLT 156 07/02/2019      Chemistry      Component Value Date/Time   NA 138 07/02/2019 0755   NA 140 02/11/2017 0755   K 3.8 07/02/2019 0755   K 4.4 02/11/2017 0755   CL 101 07/02/2019 0755   CO2 22 07/02/2019 0755   CO2 26 02/11/2017 0755   BUN 15 07/02/2019 0755   BUN 7.9 02/11/2017 0755     CREATININE 0.78 07/02/2019 0755   CREATININE 0.78 06/19/2019 1352   CREATININE 0.7 02/11/2017 0755      Component Value Date/Time   CALCIUM 8.8 (L) 07/02/2019 0755   CALCIUM 9.6 02/11/2017 0755   ALKPHOS 102 07/02/2019 0755   ALKPHOS 58 02/11/2017 0755   AST 13 (L) 07/02/2019 0755   AST 13 (L) 06/19/2019 1352   AST 27 02/11/2017 0755   ALT 7 07/02/2019 0755   ALT 10 06/19/2019 1352   ALT 24 02/11/2017 0755   BILITOT 0.3 07/02/2019 0755   BILITOT 0.5 06/19/2019 1352   BILITOT 0.43 02/11/2017 0755      No results found for: LABCA2  No components found for: LABCA125  No results for input(s): INR in the last 168 hours.  Urinalysis    Component Value Date/Time   COLORURINE STRAW (A) 11/22/2017 0940   APPEARANCEUR CLEAR 11/22/2017 0940   LABSPEC 1.002 (L) 11/22/2017 0940   LABSPEC 1.005 10/09/2015  1239   PHURINE 7.0 11/22/2017 0940   GLUCOSEU NEGATIVE 11/22/2017 0940   GLUCOSEU Negative 10/09/2015 1239   HGBUR NEGATIVE 11/22/2017 0940   BILIRUBINUR NEGATIVE 11/22/2017 0940   BILIRUBINUR Negative 10/09/2015 1239   KETONESUR NEGATIVE 11/22/2017 0940   PROTEINUR NEGATIVE 04/03/2019 1050   UROBILINOGEN 0.2 10/09/2015 1239   NITRITE NEGATIVE 11/22/2017 0940   LEUKOCYTESUR NEGATIVE 11/22/2017 0940   LEUKOCYTESUR Negative 10/09/2015 1239    STUDIES: No results found.   ELIGIBLE FOR AVAILABLE RESEARCH PROTOCOL:  no  ASSESSMENT: 54 y.o. BRCA negative Kathryn Ramsey woman status post radical tumor debulking with optimal cytoreduction (R0) 03/27/2015 for a stage IIIC, high-grade right fallopian tube carcinoma  (a) baseline CA-125 was 1226.  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)  (c) tumor is PD-L1 negative (02/24/2016)  (d) tumor is strongly estrogen receptor positive, progesterone receptor negative (02/24/2016)  (1) adjuvant chemotherapy consisted of carboplatin and paclitaxel for 6 doses, begun 04/14/2015, completed 08/11/2015  (a) paclitaxel was omitted from cycle 5 and dose reduced on cycle 6 because of neuropathy   (b) a "make up" dose of paclitaxel was given 08/25/2015  (c) last carboplatin dose was 08/11/2015  (d) CA-125 normalized by 06/16/2015   (2) FIRST RECURRENCE: January 2018  (a) CA-125 rise beginning November 2017 led to CT scans and PET scans December 2017, all negative  (b) exploratory laparotomy 02/24/2016 showed pathologically confirmed recurrence, with miliary disease involving all examined surfaces  (c) port-site metastases noted 03/08/2016  (d) the January 2018 tumor sample was tested for the estrogen receptor and he was 80 percent positive with strong staining, progesterone receptor  negative  (3) carboplatin/liposomal doxorubicin started 03/05/2016, repeated every three weeks x4, completed 06/04/2016.  (a) cycle 3 delayed one week because of neutropenia; OnPro added  (b) CA 125 normalized after cycle 3  (4) RUCAPARIB maintenance started 07/02/2016  (a) restaging 10/01/2016: Normal CA 125, negative CT of the abdomen and pelvis  (b) labs 11/24/2016 shows a rise in her CA 125-43.4, with continuing rise thereafter  (c) rucaparib discontinued October 2018 with rising CA 125  (5) anastrozole started December 23, 2016-stopped after a couple of weeks with poor tolerance  SECOND RECURRENCE: (6) Gemcitabine/Bevacizumab started on 02/04/2017  (a) gemcitabine omitted cycle 4 because of intercurrent infection  (b) gemcitabine resumed with cycle 5, with intervening rise in  Ca 125  (c) repeat CT scan of  the abdomen and pelvis on 05/26/2017 does not confirm obvious disease progression  (d) cycle 5 of gemcitabine/bevacizumab delayed 1 week   (e) gemcitabine/ bevacizumab discontinued after 6 cycles, with rising CA 125 (last dose 06/24/2017)  (7) foundation one testing did not show a high mutation burden and the microsatellite status was stable.  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  These were not immediately actionable  (8) Abraxane started 07/12/2017, given weekly or weeks 1 and 2 of each 3 weeks cycle, last dose 11/15/2017 (5 cycles)  (a) cycle 2 started 08/09/2017, will be day 1 day 8 only  (b) cycle 3 scheduled to start 09/13/2017, day 1 day 8 at patient request  (c) CT of the abdomen and pelvis 11/28/2017 shows no measurable disease  (d) PET scan 01/25/2018 and MRI of the pelvis 02/03/2018 show no measurable disease  (e) patient has symptomatic ascites and a rapidly rising Ca1 25  (9) started cisplatin/gemcitabine days 1 and 8 of each 21-day cycle on 02/18/2018  (a) switched to carboplatin/ gemcitabine 07/17/2018 due to neuropathy  (b) carbo/gemzar changed to Q14  days 07/24/2018 due to neutropenia  (c) considered niraparib/Zejula 100 mg, 2 tabs daily at the completion of chemotherapy  (10) associated problems  (a) anemia due to chemotherapy: On Aranesp  (b) 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 10/25/18 Consenting for AMV-564 clinical trial 21. 11/13/18 AMV-564 initiation 75 mcg dose 22. 10/8 Dropped down to 50 mcg dose (I think that date is correct) 23. 10/19-21/20 Tx held due to Neutropenia 24. 12/06/18 Given Neulasta 25. 12/07/18 AMV-564 resumed 26. 01/03/19 CT C/A/P 10% RECIST progression 27. 01/15/19 Increased dose to 75 mcg/daily 28. 01/17/19 Thoracentesis with cytology c/w Mullerian primary  29. Paracentesis 02/05/2019: cytology c/w adenocarcinoma  (12) schedule of thoracenteses:  (a) left 01/17/2019 ([facility])  (b) left 02/06/2019 ([facility])  (c) 05/21/2019 on the left ([facility])  (13) schedule of paracenteses:  (a) 01/30/2019 ([facility])  (b) 02/06/2019 ([facility])  (c) 02/18/2018 ([facility])  (d) 04/17/2019 ([facility])  (e) additional procedures 04/30/2019, 05/24/2019  (14) abraxane started 02/20/2019, repeated days 1 and 8 of every 21 day cycle  (a) bevacizumab/Avastin started 02/20/2019 and repeated every 21 days  (b) She cannot tolerate Zarxio due to side effects consisting of low grade fever, body aches, malaise, fatigue, and itching, whereas she has been able to tolerate Granix though  (c) bevacizumab and Abraxane discontinued after 04/10/2019 dose with evidence of progression  (15) exemestane started 04/24/2019, discontinued after 2 weeks as patient opted for chemotherapy  (16) started liposomal doxorubicin/carboplatin 05/10/2019, repeated every 28 days  (a) echocardiogram 05/08/2017 shows an ejection fraction in the 55-60% range.  (b) patient is interested in [id number] if available through the "right to try"  program  (c) HER-2 determination requested on prior pathology 06/04/2019  (d) considering [facility] study   PLAN: Varsha had a terrible time last night with her chemo and we may need to reassess whether we are proceeding with cycle 4 or not.  She will be seeing me in 2 days.  She is receiving blood today.  I am adding IV fluids.  She will also have her Decadron IV and some metoclopramide IV.  I am putting her in for fluids tomorrow as well and we can check to see if she needs additional blood 2 days from now  Total encounter time 20 minutes.*     Virgie Dad. Keighley Deckman, MD 07/03/19 8:23 AM Medical Oncology and Hematology [facility] [place], [place] [address] Tel. [phone]    Fax. [phone]   I, Wilburn Mylar, am acting as scribe for Dr. Virgie Dad. Kearstyn Avitia.  I, Lurline Del MD, have reviewed the above documentation for accuracy and completeness, and I agree with the above.   *Total Encounter Time as defined by the Centers for Medicare and Medicaid Services includes, in addition to the face-to-face time of a patient visit (documented in the note above) non-face-to-face time: obtaining and reviewing outside history, ordering and reviewing medications, tests or procedures, care coordination (communications with other health care professionals or caregivers) and documentation in the medical record.

## 2019-07-03 NOTE — Patient Instructions (Signed)

## 2019-07-04 ENCOUNTER — Encounter: Payer: Self-pay | Admitting: Oncology

## 2019-07-04 ENCOUNTER — Inpatient Hospital Stay: Payer: 59

## 2019-07-04 ENCOUNTER — Telehealth: Payer: Self-pay | Admitting: Oncology

## 2019-07-04 ENCOUNTER — Other Ambulatory Visit: Payer: Self-pay | Admitting: *Deleted

## 2019-07-04 DIAGNOSIS — T451X5A Adverse effect of antineoplastic and immunosuppressive drugs, initial encounter: Secondary | ICD-10-CM

## 2019-07-04 DIAGNOSIS — C5701 Malignant neoplasm of right fallopian tube: Secondary | ICD-10-CM | POA: Diagnosis not present

## 2019-07-04 DIAGNOSIS — R110 Nausea: Secondary | ICD-10-CM

## 2019-07-04 LAB — TYPE AND SCREEN
ABO/RH(D): A POS
Antibody Screen: NEGATIVE
Unit division: 0

## 2019-07-04 LAB — BPAM RBC
Blood Product Expiration Date: 202106042359
ISSUE DATE / TIME: 202105180857
Unit Type and Rh: 6200

## 2019-07-04 MED ORDER — SODIUM CHLORIDE 0.9% FLUSH
10.0000 mL | INTRAVENOUS | Status: DC | PRN
  Administered 2019-07-04: 10 mL via INTRAVENOUS
  Filled 2019-07-04: qty 10

## 2019-07-04 MED ORDER — SODIUM CHLORIDE 0.45 % IV SOLN
INTRAVENOUS | Status: AC
  Filled 2019-07-04: qty 1000

## 2019-07-04 MED ORDER — HEPARIN SOD (PORK) LOCK FLUSH 100 UNIT/ML IV SOLN
500.0000 [IU] | Freq: Once | INTRAVENOUS | Status: AC
  Administered 2019-07-04: 500 [IU] via INTRAVENOUS
  Filled 2019-07-04: qty 5

## 2019-07-04 NOTE — Telephone Encounter (Signed)
Made no changes to pt's schedule appts were already scheduled for 5/18 los.

## 2019-07-04 NOTE — Patient Instructions (Signed)

## 2019-07-05 ENCOUNTER — Encounter: Payer: Self-pay | Admitting: Oncology

## 2019-07-05 ENCOUNTER — Inpatient Hospital Stay: Payer: 59

## 2019-07-05 ENCOUNTER — Inpatient Hospital Stay: Payer: 59 | Admitting: Oncology

## 2019-07-05 ENCOUNTER — Other Ambulatory Visit: Payer: 59

## 2019-07-05 ENCOUNTER — Ambulatory Visit: Payer: 59

## 2019-07-09 ENCOUNTER — Ambulatory Visit: Payer: 59 | Admitting: Physical Therapy

## 2019-07-09 ENCOUNTER — Other Ambulatory Visit: Payer: Self-pay | Admitting: Oncology

## 2019-07-09 ENCOUNTER — Encounter: Payer: Self-pay | Admitting: Physical Therapy

## 2019-07-09 ENCOUNTER — Other Ambulatory Visit: Payer: Self-pay

## 2019-07-09 DIAGNOSIS — C762 Malignant neoplasm of abdomen: Secondary | ICD-10-CM

## 2019-07-09 DIAGNOSIS — R109 Unspecified abdominal pain: Secondary | ICD-10-CM

## 2019-07-09 DIAGNOSIS — M62838 Other muscle spasm: Secondary | ICD-10-CM

## 2019-07-09 DIAGNOSIS — Z1231 Encounter for screening mammogram for malignant neoplasm of breast: Secondary | ICD-10-CM

## 2019-07-09 NOTE — Therapy (Signed)
[facility] [address] [place], [place], [address] Phone: [phone]   Fax:  [phone]  Physical Therapy Treatment  Patient Details  Name: Kathryn Ramsey MRN: [id number] Date of Birth: March 12, 1965 Referring Provider (PT): Dr. Lurline Del   Encounter Date: 07/09/2019  PT End of Session - 07/09/19 1346    Visit Number  5    Date for PT Re-Evaluation  08/15/19    Authorization Type  [employer]    Authorization - Visit Number  5    Authorization - Number of Visits  23    PT Start Time  [id number]   came late   PT Stop Time  1315    PT Time Calculation (min)  29 min    Activity Tolerance  Patient tolerated treatment well    Behavior During Therapy  [facility] for tasks assessed/performed       Past Medical History:  Diagnosis Date  . Acute sensory neuropathy 06/26/2015  . Allergy   . Anemia   . Asthma    illness induced asthma  . Blood transfusion without reported diagnosis    at age 36 years old D/T surgery to femur being crushed  . 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 Heart murmur    pt, states had a "working heart murmur"  . History of kidney stones   . Neutropenia, drug-induced ([place]) 03/10/2018  . PONV (postoperative nausea and vomiting)   . Skin cancer     Past Surgical History:  Procedure Laterality Date  . 3 laporscopic proceedures    . ABDOMINAL HYSTERECTOMY     2010  . CHOLECYSTECTOMY     2010  . DILATION AND CURETTAGE OF UTERUS     1997  . IR IMAGING GUIDED PORT INSERTION  03/08/2018  . LAPAROSCOPY N/A 03/27/2015   Procedure: DIAGNOSTIC LAPAROSCOPY ;  Surgeon: [name], MD;  Location: [facility];  Service: Gynecology;  Laterality: N/A;  . LAPAROSCOPY N/A 02/24/2016   Procedure: LAPAROSCOPY DIAGNOSTIC WITH PERITONEAL WASHINGS AND PERITONEAL BIOPSY;  Surgeon: [name], MD;  Location: [facility];  Service: Gynecology;  Laterality: N/A;  .  LAPAROTOMY N/A 03/27/2015   Procedure: EXPLORATORY LAPAROTOMY, BILATERAL SALPINGO OOPHORECTOMY, OMENTECTOMY, RADICAL TUMOR DEBULKING;  Surgeon: [name], MD;  Location: [facility];  Service: Gynecology;  Laterality: N/A;  . sinus surgery 1994      There were no vitals filed for this visit.  Subjective Assessment - 07/09/19 1251    Subjective  I am not feeling great today. I feel very weak today. It is hard for me to eat enogh. I had a bowel movment today that was soft.    Patient Stated Goals  improve mobility of the intestines    Currently in Pain?  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 Lurline Del    Prior Therapy  yes      Precautions   Precautions  Other (comment)    Precaution Comments  current malignancy      Observation/Other Assessments-Edema    Edema  Circumferential      Circumferential Edema   Circumferential - Right  prior to treamtent at umbilicus is 123456 cm and after 73 cm                    [facility] Adult PT Treatment/Exercise - 07/09/19 0001      Manual Therapy   Manual Therapy  Myofascial release;Soft tissue mobilization    Manual therapy comments  while patient getting manual work she has moist heat on the lumbar area    Soft tissue mobilization  gentle circular motion around the abdomen to promote peristalic motion of the abdomen    Myofascial Release  lifting up the intestines from the bladder, mobilizatio no fthe ilioceccal  valve, release of the left upper abdomen, release of the umbilicus               PT Short Term Goals - 06/25/19 1104      PT SHORT TERM GOAL #1   Title  able to reports pain level decreased by 1 level after treatment due to improved visceral mobility    Time  4    Period  Weeks    Status  Achieved      PT SHORT TERM GOAL #2   Title  understand correct toileting technique to assist stool to come out with relaxation of the pelvic floor    Time  4    Period  Weeks    Status  Achieved        PT Long Term Goals - 07/09/19 1345      PT LONG TERM GOAL #1   Title  understand how to perfrom lower abdominal fascial work to reduce the intestines from sticking together due to her cancer    Time  12    Period  Weeks    Status  On-going      PT LONG TERM GOAL #2   Title  pain level after treatment decreased by 2 pain levels due to improved visceral mobility    Time  12    Period  Weeks    Status  On-going      PT LONG TERM GOAL #3   Title  abdominal circumference reduced >/= 1 cm after treatment due to improved visceral mobility    Baseline  0.5cm    Time  12    Period  Weeks    Status  On-going            Plan - 07/09/19 1343    Clinical Impression Statement  Patient has had a bowel movement today. She is very fatiqued today. Patient circumference of the abdomen went from 73.5 cm to 73 cm after manual work. Patient had increased gurgling noise in the abdomen and softness after the manual work. Patient will benefit from skilled therapy to improve abdominal, visceral and intestinal mobility to prevent constipation and reduce pain for comfort.    Personal Factors and Comorbidities  Comorbidity 3+;Age    Comorbidities  abdominal carcinomatosis; cancer of right fallopian tube; carcinomatosis peritonei; currently on chemotherapy; abdominal hysterectomy    Examination-Activity Limitations  Sleep;Bend;Locomotion Level  Examination-Participation Restrictions  Community  Activity;Interpersonal Relationship    Stability/Clinical Decision Making  Evolving/Moderate complexity    Rehab Potential  Good    PT Frequency  1x / week    PT Duration  12 weeks    PT Treatment/Interventions  Moist Heat;Therapeutic activities;Therapeutic exercise;Neuromuscular re-education;Manual techniques;Patient/family education    PT Next Visit Plan  visceral mobilization; mobilization of the diaphragm; rib cage mobilization; measure abdominal circumference    Consulted and Agree with Plan of Care  Patient       Patient will benefit from skilled therapeutic intervention in order to improve the following deficits and impairments:  Pain, Increased fascial restricitons, Decreased activity tolerance  Visit Diagnosis: Abdominal pain, unspecified abdominal location  Muscle spasm  Abdominal carcinomatosis [facility])     Problem List Patient Active Problem List   Diagnosis Date Noted  . Dysphagia 05/25/2019  . Neuropathy due to chemotherapeutic drug ([place]) 07/24/2018  . Anemia due to antineoplastic chemotherapy 05/22/2018  . Port-A-Cath in place 05/01/2018  . Neutropenia, drug-induced ([place]) 03/10/2018  . Diarrhea 08/02/2017  . Lichen sclerosus et atrophicus of the vulva 03/31/2016  . Abdominal wall mass of right lower quadrant 03/08/2016  . 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 Goals of care, counseling/discussion 02/27/2016  . Elevated CA-125 02/24/2016  . Vaginal dryness, menopausal 10/01/2015  . Drug-induced peripheral neuropathy ([place]) 09/02/2015  . Leukopenia due to antineoplastic chemotherapy ([place]) 09/02/2015  . Chemotherapy-induced neuropathy ([place]) 08/19/2015  . Cellulitis 07/03/2015  . Acute sensory neuropathy 06/26/2015  . High risk medication use 06/21/2015  . Facial paresthesia 06/18/2015  . Genetic testing 06/16/2015  . Restless legs 05/14/2015  . Family history of breast cancer in female 04/29/2015  . Chemotherapy-induced nausea 04/22/2015  . Chemotherapy  induced neutropenia ([place]) 04/22/2015  . 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 History of hysterectomy for benign disease 03/21/2015  . IBS (irritable bowel syndrome) 03/21/2015  . Dense breast tissue 03/21/2015  . History of cholecystectomy 03/21/2015  . Abdominal carcinomatosis ([place]) 03/21/2015    Earlie Counts, PT 07/09/19 1:47 PM   [facility] [address], [place], [place], [address] Phone: [phone]   Fax:  [phone]  Name: Kathryn Ramsey MRN: [id number] Date of Birth: 04/18/1965

## 2019-07-18 ENCOUNTER — Telehealth: Payer: Self-pay | Admitting: Oncology

## 2019-07-18 ENCOUNTER — Other Ambulatory Visit: Payer: Self-pay | Admitting: Oncology

## 2019-07-18 DIAGNOSIS — C786 Secondary malignant neoplasm of retroperitoneum and peritoneum: Secondary | ICD-10-CM

## 2019-07-18 NOTE — Telephone Encounter (Signed)
Scheduled appt per 6/2 sch message - unable to reach pt  Left message with appt date and time

## 2019-07-18 NOTE — Progress Notes (Signed)
Kathryn Ramsey sent me an email wondering if she should have a repeat scan and tells me there are some bumps on her abdomen she wanted me to look at.  I have scheduled her to see me tomorrow at 3 PM.

## 2019-07-19 ENCOUNTER — Inpatient Hospital Stay: Payer: 59

## 2019-07-19 ENCOUNTER — Inpatient Hospital Stay: Payer: 59 | Attending: Oncology | Admitting: Oncology

## 2019-07-19 ENCOUNTER — Other Ambulatory Visit: Payer: Self-pay

## 2019-07-19 VITALS — BP 92/59 | HR 83 | Temp 98.7°F | Resp 17 | Ht 63.0 in | Wt 93.2 lb

## 2019-07-19 DIAGNOSIS — Z79899 Other long term (current) drug therapy: Secondary | ICD-10-CM | POA: Diagnosis not present

## 2019-07-19 DIAGNOSIS — D6481 Anemia due to antineoplastic chemotherapy: Secondary | ICD-10-CM | POA: Insufficient documentation

## 2019-07-19 DIAGNOSIS — G629 Polyneuropathy, unspecified: Secondary | ICD-10-CM | POA: Diagnosis not present

## 2019-07-19 DIAGNOSIS — Z5189 Encounter for other specified aftercare: Secondary | ICD-10-CM | POA: Insufficient documentation

## 2019-07-19 DIAGNOSIS — T451X5A Adverse effect of antineoplastic and immunosuppressive drugs, initial encounter: Secondary | ICD-10-CM | POA: Insufficient documentation

## 2019-07-19 DIAGNOSIS — Z85828 Personal history of other malignant neoplasm of skin: Secondary | ICD-10-CM | POA: Insufficient documentation

## 2019-07-19 DIAGNOSIS — Z5111 Encounter for antineoplastic chemotherapy: Secondary | ICD-10-CM | POA: Insufficient documentation

## 2019-07-19 DIAGNOSIS — R5383 Other fatigue: Secondary | ICD-10-CM | POA: Diagnosis not present

## 2019-07-19 DIAGNOSIS — Z888 Allergy status to other drugs, medicaments and biological substances status: Secondary | ICD-10-CM | POA: Diagnosis not present

## 2019-07-19 DIAGNOSIS — R188 Other ascites: Secondary | ICD-10-CM | POA: Diagnosis not present

## 2019-07-19 DIAGNOSIS — Z882 Allergy status to sulfonamides status: Secondary | ICD-10-CM | POA: Diagnosis not present

## 2019-07-19 DIAGNOSIS — C786 Secondary malignant neoplasm of retroperitoneum and peritoneum: Secondary | ICD-10-CM | POA: Diagnosis not present

## 2019-07-19 DIAGNOSIS — K5900 Constipation, unspecified: Secondary | ICD-10-CM | POA: Insufficient documentation

## 2019-07-19 DIAGNOSIS — R531 Weakness: Secondary | ICD-10-CM | POA: Insufficient documentation

## 2019-07-19 DIAGNOSIS — Z87442 Personal history of urinary calculi: Secondary | ICD-10-CM | POA: Insufficient documentation

## 2019-07-19 DIAGNOSIS — C5701 Malignant neoplasm of right fallopian tube: Secondary | ICD-10-CM

## 2019-07-19 DIAGNOSIS — D696 Thrombocytopenia, unspecified: Secondary | ICD-10-CM | POA: Insufficient documentation

## 2019-07-19 DIAGNOSIS — Z9079 Acquired absence of other genital organ(s): Secondary | ICD-10-CM | POA: Insufficient documentation

## 2019-07-19 DIAGNOSIS — Z803 Family history of malignant neoplasm of breast: Secondary | ICD-10-CM | POA: Insufficient documentation

## 2019-07-19 DIAGNOSIS — D709 Neutropenia, unspecified: Secondary | ICD-10-CM | POA: Diagnosis not present

## 2019-07-19 DIAGNOSIS — G620 Drug-induced polyneuropathy: Secondary | ICD-10-CM

## 2019-07-19 DIAGNOSIS — Z170 Estrogen receptor positive status [ER+]: Secondary | ICD-10-CM | POA: Insufficient documentation

## 2019-07-19 DIAGNOSIS — I700 Atherosclerosis of aorta: Secondary | ICD-10-CM | POA: Diagnosis not present

## 2019-07-19 DIAGNOSIS — Z823 Family history of stroke: Secondary | ICD-10-CM | POA: Insufficient documentation

## 2019-07-19 DIAGNOSIS — C801 Malignant (primary) neoplasm, unspecified: Secondary | ICD-10-CM | POA: Diagnosis not present

## 2019-07-19 DIAGNOSIS — Z8249 Family history of ischemic heart disease and other diseases of the circulatory system: Secondary | ICD-10-CM | POA: Insufficient documentation

## 2019-07-19 DIAGNOSIS — D701 Agranulocytosis secondary to cancer chemotherapy: Secondary | ICD-10-CM

## 2019-07-19 DIAGNOSIS — Z90722 Acquired absence of ovaries, bilateral: Secondary | ICD-10-CM | POA: Insufficient documentation

## 2019-07-19 DIAGNOSIS — Z8051 Family history of malignant neoplasm of kidney: Secondary | ICD-10-CM | POA: Insufficient documentation

## 2019-07-19 DIAGNOSIS — Z808 Family history of malignant neoplasm of other organs or systems: Secondary | ICD-10-CM | POA: Insufficient documentation

## 2019-07-19 DIAGNOSIS — R112 Nausea with vomiting, unspecified: Secondary | ICD-10-CM | POA: Insufficient documentation

## 2019-07-19 DIAGNOSIS — C762 Malignant neoplasm of abdomen: Secondary | ICD-10-CM

## 2019-07-19 LAB — CBC WITH DIFFERENTIAL/PLATELET
Abs Immature Granulocytes: 0.05 10*3/uL (ref 0.00–0.07)
Basophils Absolute: 0 10*3/uL (ref 0.0–0.1)
Basophils Relative: 0 %
Eosinophils Absolute: 0 10*3/uL (ref 0.0–0.5)
Eosinophils Relative: 0 %
HCT: 26 % — ABNORMAL LOW (ref 36.0–46.0)
Hemoglobin: 8.5 g/dL — ABNORMAL LOW (ref 12.0–15.0)
Immature Granulocytes: 1 %
Lymphocytes Relative: 9 %
Lymphs Abs: 0.4 10*3/uL — ABNORMAL LOW (ref 0.7–4.0)
MCH: 30.1 pg (ref 26.0–34.0)
MCHC: 32.7 g/dL (ref 30.0–36.0)
MCV: 92.2 fL (ref 80.0–100.0)
Monocytes Absolute: 0.2 10*3/uL (ref 0.1–1.0)
Monocytes Relative: 4 %
Neutro Abs: 4.1 10*3/uL (ref 1.7–7.7)
Neutrophils Relative %: 86 %
Platelets: 31 10*3/uL — ABNORMAL LOW (ref 150–400)
RBC: 2.82 MIL/uL — ABNORMAL LOW (ref 3.87–5.11)
RDW: 16.3 % — ABNORMAL HIGH (ref 11.5–15.5)
WBC: 4.8 10*3/uL (ref 4.0–10.5)
nRBC: 0 % (ref 0.0–0.2)

## 2019-07-19 LAB — TYPE AND SCREEN
ABO/RH(D): A POS
Antibody Screen: NEGATIVE

## 2019-07-19 LAB — COMPREHENSIVE METABOLIC PANEL
ALT: 8 U/L (ref 0–44)
AST: 11 U/L — ABNORMAL LOW (ref 15–41)
Albumin: 3.6 g/dL (ref 3.5–5.0)
Alkaline Phosphatase: 114 U/L (ref 38–126)
Anion gap: 12 (ref 5–15)
BUN: 24 mg/dL — ABNORMAL HIGH (ref 6–20)
CO2: 25 mmol/L (ref 22–32)
Calcium: 9.2 mg/dL (ref 8.9–10.3)
Chloride: 100 mmol/L (ref 98–111)
Creatinine, Ser: 0.97 mg/dL (ref 0.44–1.00)
GFR calc Af Amer: >60 mL/min (ref >60)
GFR calc non Af Amer: >60 mL/min (ref >60)
Glucose, Bld: 120 mg/dL — ABNORMAL HIGH (ref 70–99)
Potassium: 4 mmol/L (ref 3.5–5.1)
Sodium: 137 mmol/L (ref 135–145)
Total Bilirubin: 0.4 mg/dL (ref 0.3–1.2)
Total Protein: 7.8 g/dL (ref 6.5–8.1)

## 2019-07-19 NOTE — Progress Notes (Signed)
Kathryn Ramsey  [phone] [phone]) [phone]    ID: Kathryn Ramsey DOB: Sep 05, 1965  MR#: [id number]  FKC#:[id number]  Patient Care Team: Patient, No Pcp Per as PCP - General (General Practice) [name], MD as Consulting Physician (Obstetrics and Gynecology) Juanita Craver, MD as Consulting Physician (Gastroenterology) Servando Salina, MD as Consulting Physician (Obstetrics and Gynecology) Nickie Retort, MD as Consulting Physician (Urology) Gillis Ends, MD as Referring Physician (Obstetrics and Gynecology) Mettu, Baltazar Apo, MD as Referring Physician (Oncology) OTHER MD: Moss Mc [phone]); Floyde Parkins; Gerhard Perches Matulonis [phone])  NOTE: Patient does not want to be informed of her CA-125.  CHIEF COMPLAINT:  ovarian cancer  CURRENT TREATMENT: Liposomal doxorubicin, carboplatin   INTERVAL HISTORY: [name] returns today for follow up of her metastatic ovarian cancer. She contacted me yesterday regarding bumps on her abdomen and whether or not she needs restaging scans.  She is accompanied by her husband  She also had a televisit with Dr. Terrace Arabia at [facility].  She was concerned about the Fredericka's weight loss and suggested more high-calorie foods.  She also suggested Lanya take 2 mg of dexamethasone most mornings and that seems to be helping a little  Quadasia received her third cycle of carboplatin and Doxil on 07/02/2019.  Today is day 18 cycle 3 and she is scheduled for her next cycle 08/02/2019.  She is also scheduled for bilateral screening mammography on 08/13/2019.    REVIEW OF SYSTEMS: Kristalynn has found of these chemotherapy treatments quite difficult.  They knock her down for longer periods than before.  She is not exercising or taking walks right now.  She is able to do her housework and cooking.  On the plus side she has now rare vomiting, has not required a paracentesis since early April, and she is managing  to have bowel movements regularly although she has been reluctant to take MiraLAX.  If this does not agree with her.  She has a feeling of tightness when she stands up from a sitting position in her belly.  She has developed these little "bumps" that she wanted me to look at today and they are described below.  For pain she takes Tylenol and rarely tramadol.  The good news is her older son just got engaged.  A detailed review of systems today was otherwise stable   HISTORY OF PRESENT ILLNESS: From Dr. Serita Grit original intake note 03/17/2015:  "Kathryn Ramsey is a very pleasant G4P4 who is seen in consultation at the request of Dr Collene Mares and Dr Garwin Brothers for peritoneal carcinomatosis. The patient has a history of a workup by urology for gross hematuria which included a pelvic examine was suggested for extrinsic mass effect on the bladder on cystoscopy. Cystoscopy took place on in December 2016. The patient denies abdominal pain bloating early satiety or abdominal distention.  On 08/03/2015 at [facility] she underwent a CT scan of the abdomen and pelvis as ordered by Dr Baruch Gouty. This revealed a 1.4 cm low attenuation lesion on the posterior right hepatic lobe suggestive of a hemangioma. Status post hysterectomy. No ovarian masses. Moderate ascites. Omental caking seen in the lateral left abdomen and pelvis. Peritoneal nodularity in the left lateral pelvic cul-de-sac. No gross extrinsic compression on the bladder was identified on imaging.  The patient was then seen by her gastroenterologist, Dr. Collene Mares, who performed a colonoscopy which was unremarkable.  Tumor markers were drawn on 08/04/2015 and these included a CA-125 that was elevated to 957,  and a CEA that was normal at 1."  On 03/27/2015 the patient underwent diagnostic laparoscopy, exploratory laparotomy with bilateral salpingo-oophorectomy, omentectomy, and radical tumor debulking with optimal cytoreduction (R0) of what proved to be a stage  IIIc right fallopian tube cancer. She was treated adjuvantly with carboplatin and paclitaxel, as detailed below. Her CA 125 dropped from 1226 on 03/20/2015 to 26.1 by 06/16/2015.  Her subsequent history is as detailed above.   PAST MEDICAL HISTORY: Past Medical History:  Diagnosis Date  . Acute sensory neuropathy 06/26/2015  . Allergy   . Anemia   . Asthma    illness induced asthma  . Blood transfusion without reported diagnosis    at age 54 years old D/T surgery to femur being crushed  . 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 Heart murmur    pt, states had a "working heart murmur"  . History of kidney stones   . Neutropenia, drug-induced ([place]) 03/10/2018  . PONV (postoperative nausea and vomiting)   . Skin cancer     PAST SURGICAL HISTORY: Past Surgical History:  Procedure Laterality Date  . 3 laporscopic proceedures    . ABDOMINAL HYSTERECTOMY     2010  . CHOLECYSTECTOMY     2010  . DILATION AND CURETTAGE OF UTERUS     1997  . IR IMAGING GUIDED PORT INSERTION  03/08/2018  . LAPAROSCOPY N/A 03/27/2015   Procedure: DIAGNOSTIC LAPAROSCOPY ;  Surgeon: [name], MD;  Location: [facility];  Service: Gynecology;  Laterality: N/A;  . LAPAROSCOPY N/A 02/24/2016   Procedure: LAPAROSCOPY DIAGNOSTIC WITH PERITONEAL WASHINGS AND PERITONEAL BIOPSY;  Surgeon: [name], MD;  Location: [facility];  Service: Gynecology;  Laterality: N/A;  . LAPAROTOMY N/A 03/27/2015   Procedure: EXPLORATORY LAPAROTOMY, BILATERAL SALPINGO OOPHORECTOMY, OMENTECTOMY, RADICAL TUMOR DEBULKING;  Surgeon: [name], MD;  Location: [facility];  Service: Gynecology;  Laterality: N/A;  . sinus surgery 1994      FAMILY HISTORY Family History  Problem Relation Age of Onset  . Hypertension Father   . Skin cancer Father        nonmelanoma skin cancers in his late 77s  . Non-Hodgkin's lymphoma Maternal Aunt        dx. 52s; smoker  . Stroke Maternal  Grandmother   . Other Mother        benign meningioma dx. early-mid-70s; hysterectomy in her late 19s for heavy periods - still has ovaries  . Other Son        one son with pre-cancerous skin findings  . Other Daughter        cysts on ovaries and hx of heavy periods  . Other Sister        hysterectomy for cysts - still has ovaries  . Heart attack Maternal Grandfather   . Heart attack Paternal Grandfather   . Renal cancer Maternal Aunt 60       smoker  . Breast cancer Maternal Aunt 78  . Other Maternal Aunt        dx. benign brain tumor (meningioma) at 91; 2nd benign brain tumor in her late 77s; hx of radical hysterectomy at age 39  . Brain cancer Other        NOS tumor  The patient's parents are both living, in their late 48s as of January 2018. The patient has one brother, 3 sisters. There is no history of ovarian cancer  in the family. One maternal aunt had breast and kidney cancer, another had non-Hodgkin's lymphoma.   GYNECOLOGIC HISTORY:  No LMP recorded. Patient has had a hysterectomy. 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 Her husband [name] is a Engineer, maintenance (IT). Their children are 23, 1, 61 and 65 y/o as of JAN 2018. The oldest works as an Electrical engineer in the [employer] in [place]. The other 3 children live in [place], the youngest currently attending [employer]. The patient has no grandchildren. She attends [employer]    ADVANCED DIRECTIVES: Starlina has completed advanced directives and they are filed.  Her husband [name] is her healthcare power of attorney.   HEALTH MAINTENANCE: Social History   Tobacco Use  . Smoking status: Never Smoker  . Smokeless tobacco: Never Used  Substance Use Topics  . Alcohol use: Yes    Comment:  3 glasses wine or beer/week  . Drug use: No    Colonoscopy:2016  PAP: s/p hyst  Bone density:   Allergies  Allergen Reactions  .  Bee Venom Shortness Of Breath    swelling  . Prochlorperazine Edisylate Anaphylaxis, Anxiety and Palpitations    Anaphylaxis, Anxiety and Palpitations   . Sulfa Antibiotics Shortness Of Breath    Vomit , diarrhea , hives  . Phenergan [Promethazine Hcl] Other (See Comments)    "funny sensation in my throat"  . Zarxio [Filgrastim]     Current Outpatient Medications  Medication Sig Dispense Refill  . acetaminophen (TYLENOL) 500 MG tablet Take 2 tablets by mouth 3 (three) times daily as needed.    [name] albuterol (VENTOLIN HFA) 108 (90 Base) MCG/ACT inhaler Inhale into the lungs.    [name] dexamethasone (DECADRON) 4 MG tablet Take 2 tablets by mouth once a day for 3 days after chemo. Take with food. 30 tablet 1  . dicyclomine (BENTYL) 10 MG capsule Take 1 capsule (10 mg total) by mouth 4 (four) times daily -  before meals and at bedtime. 120 capsule 0  . diphenhydrAMINE (BENADRYL) 50 MG capsule 50 mg Diphenhydramine (Benadryl) PO within 1 hour of the injection 1 capsule 0  . fluticasone (FLONASE) 50 MCG/ACT nasal spray Place 1 spray into both nostrils 2 (two) times daily. (Patient taking differently: Place 1 spray into both nostrils as needed. ) 16 g 2  . gabapentin (NEURONTIN) 100 MG capsule 200 mg bid with 300 mg qhs 630 capsule 1  . ibuprofen (ADVIL) 600 MG tablet Take 1 tablet by mouth 4 (four) times daily as needed.    . lidocaine-prilocaine (EMLA) cream APPLY TOPICALLY AS NEEDED. 30 g 0  . loratadine (CLARITIN) 10 MG tablet Take 10 mg by mouth as needed.     [name] LORazepam (ATIVAN) 0.5 MG tablet Take 1 tablet (0.5 mg total) by mouth at bedtime as needed (Nausea or vomiting). 20 tablet 0  . Magnesium (V-R MAGNESIUM) 250 MG TABS Take by mouth.    . magnesium oxide (MAG-OX) 400 (241.3 Mg) MG tablet Take 1 tablet (400 mg total) by mouth daily. 60 tablet 3  . metoCLOPramide (REGLAN) 5 MG tablet Take 2 tablets (10 mg total) by mouth every 8 (eight) hours as needed for nausea. 30 tablet 2  . Multiple  Vitamins-Minerals (MULTIVITAMIN ADULT PO) Take 1 tablet by mouth daily.    . Omega 3 1000 MG CAPS Take 1 capsule by mouth daily.    [name]  omeprazole (PRILOSEC) 40 MG capsule Take 1 capsule (40 mg total) by mouth daily. 30 capsule 5  . prochlorperazine (COMPAZINE) 5 MG tablet Take 1 tablet (5 mg total) by mouth every 8 (eight) hours as needed for nausea or vomiting. 30 tablet 6  . promethazine (PHENERGAN) 25 MG suppository Place 1 suppository (25 mg total) rectally every 6 (six) hours as needed for nausea or vomiting. 12 each 0  . scopolamine (TRANSDERM-SCOP) 1 MG/3DAYS APPLY 1 PATCH EVERY 3 DAYS AS NEEDED FOR NAUSEA. 4 patch 0  . scopolamine (TRANSDERM-SCOP) 1 MG/3DAYS Place 1 patch (1.5 mg total) onto the skin every 3 (three) days. 10 patch 12  . senna-docusate (SENOKOT-S) 8.6-50 MG tablet Take 1-2 tablets by mouth 2 (two) times daily as needed.    . sertraline (ZOLOFT) 50 MG tablet Take 1.5 tablets (75 mg total) by mouth daily. Increase dose to 75 mg daily 45 tablet 3  . traMADol (ULTRAM) 50 MG tablet Take 1 tablet (50 mg total) by mouth every 6 (six) hours as needed. 60 tablet 0  . valACYclovir (VALTREX) 1000 MG tablet Take 1 tablet (1,000 mg total) by mouth daily. 5 tablet 0  . VENTOLIN HFA 108 (90 Base) MCG/ACT inhaler     . vitamin B-12 (CYANOCOBALAMIN) 1000 MCG tablet Take 1 tablet by mouth daily.     No current facility-administered medications for this visit.   Facility-Administered Medications Ordered in Other Visits  Medication Dose Route Frequency Provider Last Rate Last Admin  . sodium chloride flush (NS) 0.9 % injection 10 mL  10 mL Intravenous PRN Gustie Bobb, Virgie Dad, MD        OBJECTIVE: White woman in no acute distress Vitals:   07/19/19 1503  BP: (!) 92/59  Pulse: 83  Resp: 17  Temp: 98.7 F (37.1 C)  SpO2: 100%     Body mass index is 16.51 kg/m.   Filed Weights   07/19/19 1503  Weight: 93 lb 3.2 oz (42.3 kg)     ECOG FS:2 - Symptomatic, <50% confined to  bed  Sclerae unicteric, EOMs intact Wearing a mask No cervical or supraclavicular adenopathy Lungs no rales or rhonchi Heart regular rate and rhythm Abd terse but not distended, nontender, decreased bowel sounds MSK no focal spinal tenderness, no upper extremity lymphedema Neuro: nonfocal, well oriented, positive affect Breasts: Deferred Skin: The "bumps" she notes are actually I think small hernias.  One of them is clearly associated with her abdominal incision and it is reducible.  There is one though that is quite superior, at the level of the xiphoid process, again soft and apparently reducible but there is no nearby incision to explain it  LAB RESULTS:  CMP     Component Value Date/Time   NA 138 07/02/2019 0755   NA 140 02/11/2017 0755   K 3.8 07/02/2019 0755   K 4.4 02/11/2017 0755   CL 101 07/02/2019 0755   CO2 22 07/02/2019 0755   CO2 26 02/11/2017 0755   GLUCOSE 80 07/02/2019 0755   GLUCOSE 93 02/11/2017 0755   BUN 15 07/02/2019 0755   BUN 7.9 02/11/2017 0755   CREATININE 0.78 07/02/2019 0755   CREATININE 0.78 06/19/2019 1352   CREATININE 0.7 02/11/2017 0755   CALCIUM 8.8 (L) 07/02/2019 0755   CALCIUM 9.6 02/11/2017 0755   PROT 7.1 07/02/2019 0755   PROT 7.5 02/11/2017 0755   ALBUMIN 3.1 (L) 07/02/2019 0755   ALBUMIN 4.4 02/11/2017 0755   AST 13 (L) 07/02/2019 5597  AST 13 (L) 06/19/2019 1352   AST 27 02/11/2017 0755   ALT 7 07/02/2019 0755   ALT 10 06/19/2019 1352   ALT 24 02/11/2017 0755   ALKPHOS 102 07/02/2019 0755   ALKPHOS 58 02/11/2017 0755   BILITOT 0.3 07/02/2019 0755   BILITOT 0.5 06/19/2019 1352   BILITOT 0.43 02/11/2017 0755   GFRNONAA >60 07/02/2019 0755   GFRNONAA >60 06/19/2019 1352   GFRAA >60 07/02/2019 0755   GFRAA >60 06/19/2019 1352    INo results found for: SPEP, UPEP  Lab Results  Component Value Date   WBC 4.8 07/19/2019   NEUTROABS PENDING 07/19/2019   HGB 8.5 (L) 07/19/2019   HCT 26.0 (L) 07/19/2019   MCV 92.2  07/19/2019   PLT 31 (L) 07/19/2019      Chemistry      Component Value Date/Time   NA 138 07/02/2019 0755   NA 140 02/11/2017 0755   K 3.8 07/02/2019 0755   K 4.4 02/11/2017 0755   CL 101 07/02/2019 0755   CO2 22 07/02/2019 0755   CO2 26 02/11/2017 0755   BUN 15 07/02/2019 0755   BUN 7.9 02/11/2017 0755   CREATININE 0.78 07/02/2019 0755   CREATININE 0.78 06/19/2019 1352   CREATININE 0.7 02/11/2017 0755      Component Value Date/Time   CALCIUM 8.8 (L) 07/02/2019 0755   CALCIUM 9.6 02/11/2017 0755   ALKPHOS 102 07/02/2019 0755   ALKPHOS 58 02/11/2017 0755   AST 13 (L) 07/02/2019 0755   AST 13 (L) 06/19/2019 1352   AST 27 02/11/2017 0755   ALT 7 07/02/2019 0755   ALT 10 06/19/2019 1352   ALT 24 02/11/2017 0755   BILITOT 0.3 07/02/2019 0755   BILITOT 0.5 06/19/2019 1352   BILITOT 0.43 02/11/2017 0755      No results found for: LABCA2  No components found for: LABCA125  No results for input(s): INR in the last 168 hours.  Urinalysis    Component Value Date/Time   COLORURINE STRAW (A) 11/22/2017 0940   APPEARANCEUR CLEAR 11/22/2017 0940   LABSPEC 1.002 (L) 11/22/2017 0940   LABSPEC 1.005 10/09/2015 1239   PHURINE 7.0 11/22/2017 0940   GLUCOSEU NEGATIVE 11/22/2017 0940   GLUCOSEU Negative 10/09/2015 1239   HGBUR NEGATIVE 11/22/2017 0940   BILIRUBINUR NEGATIVE 11/22/2017 0940   BILIRUBINUR Negative 10/09/2015 1239   KETONESUR NEGATIVE 11/22/2017 0940   PROTEINUR NEGATIVE 04/03/2019 1050   UROBILINOGEN 0.2 10/09/2015 1239   NITRITE NEGATIVE 11/22/2017 0940   LEUKOCYTESUR NEGATIVE 11/22/2017 0940   LEUKOCYTESUR Negative 10/09/2015 1239    STUDIES: No results found.   ELIGIBLE FOR AVAILABLE RESEARCH PROTOCOL:  no  ASSESSMENT: 54 y.o. BRCA negative [place] woman status post radical tumor debulking with optimal cytoreduction (R0) 03/27/2015 for a stage IIIC, high-grade right fallopian tube carcinoma  (a) baseline CA-125 was 1226.  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  (c.1347A>G (p.Lys449Lys)  (c) tumor is PD-L1 negative (02/24/2016)  (d) tumor is strongly estrogen receptor positive, progesterone receptor negative (02/24/2016)  (1) adjuvant chemotherapy consisted of carboplatin and paclitaxel for 6 doses, begun 04/14/2015, completed 08/11/2015  (a) paclitaxel was omitted from cycle 5 and dose reduced on cycle 6 because of neuropathy   (b) a "make up" dose of paclitaxel was given 08/25/2015  (c) last carboplatin dose was 08/11/2015  (d) CA-125 normalized by 06/16/2015   (  2) FIRST RECURRENCE: January 2018  (a) CA-125 rise beginning November 2017 led to CT scans and PET scans December 2017, all negative  (b) exploratory laparotomy 02/24/2016 showed pathologically confirmed recurrence, with miliary disease involving all examined surfaces  (c) port-site metastases noted 03/08/2016  (d) the January 2018 tumor sample was tested for the estrogen receptor and he was 80 percent positive with strong staining, progesterone receptor negative  (3) carboplatin/liposomal doxorubicin started 03/05/2016, repeated every three weeks x4, completed 06/04/2016.  (a) cycle 3 delayed one week because of neutropenia; OnPro added  (b) CA 125 normalized after cycle 3  (4) RUCAPARIB maintenance started 07/02/2016  (a) restaging 10/01/2016: Normal CA 125, negative CT of the abdomen and pelvis  (b) labs 11/24/2016 shows a rise in her CA 125-43.4, with continuing rise thereafter  (c) rucaparib discontinued October 2018 with rising CA 125  (5) anastrozole started December 23, 2016-stopped after a couple of weeks with poor tolerance  SECOND RECURRENCE: (6) Gemcitabine/Bevacizumab started on 02/04/2017  (a) gemcitabine omitted cycle 4 because of intercurrent infection  (b) gemcitabine resumed with cycle 5, with  intervening rise in  Ca 125  (c) repeat CT scan of the abdomen and pelvis on 05/26/2017 does not confirm obvious disease progression  (d) cycle 5 of gemcitabine/bevacizumab delayed 1 week   (e) gemcitabine/ bevacizumab discontinued after 6 cycles, with rising CA 125 (last dose 06/24/2017)  (7) foundation one testing did not show a high mutation burden and the microsatellite status was stable.  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  These were not immediately actionable  (8) Abraxane started 07/12/2017, given weekly or weeks 1 and 2 of each 3 weeks cycle, last dose 11/15/2017 (5 cycles)  (a) cycle 2 started 08/09/2017, will be day 1 day 8 only  (b) cycle 3 scheduled to start 09/13/2017, day 1 day 8 at patient request  (c) CT of the abdomen and pelvis 11/28/2017 shows no measurable disease  (d) PET scan 01/25/2018 and MRI of the pelvis 02/03/2018 show no measurable disease  (e) patient has symptomatic ascites and a rapidly rising Ca1 25  (9) started cisplatin/gemcitabine days 1 and 8 of each 21-day cycle on 02/18/2018  (a) switched to carboplatin/ gemcitabine 07/17/2018 due to neuropathy  (b) carbo/gemzar changed to Q14 days 07/24/2018 due to neutropenia  (c) considered niraparib/Zejula 100 mg, 2 tabs daily at the completion of chemotherapy  (10) associated problems  (a) anemia due to chemotherapy: On Aranesp  (b) 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 10/25/18 Consenting for AMV-564 clinical trial 21. 11/13/18 AMV-564 initiation 75 mcg dose 22. 10/8 Dropped down to 50 mcg dose (I think that date is correct) 23. 10/19-21/20 Tx held due to Neutropenia 24. 12/06/18 Given Neulasta 25. 12/07/18 AMV-564 resumed 26. 01/03/19 CT C/A/P 10% RECIST progression 27. 01/15/19 Increased dose to 75 mcg/daily 28. 01/17/19 Thoracentesis with cytology c/w  [place] primary  29. Paracentesis 02/05/2019: cytology c/w adenocarcinoma  (12) schedule of thoracenteses:  (a) left 01/17/2019 [facility])  (b) left 02/06/2019 [facility])  (c) 05/21/2019 on the left [facility])  (13) schedule of paracenteses:  (a) 01/30/2019 ([place])  (b) 02/06/2019 ([place])  (c) 02/18/2018 ([place])  (d) 04/17/2019 ([place])  (e) additional procedures 04/30/2019, 05/24/2019  (14) abraxane started 02/20/2019, repeated days 1 and 8 of every 21 day cycle  (a) bevacizumab/Avastin started 02/20/2019 and repeated every 21 days  (b)  She cannot tolerate Zarxio due to side effects consisting of low grade fever, body aches, malaise, fatigue, and itching, whereas she has been able to tolerate Granix though  (c) bevacizumab and Abraxane discontinued after 04/10/2019 dose with evidence of progression  (15) exemestane started 04/24/2019, discontinued after 2 weeks as patient opted for chemotherapy  (16) started liposomal doxorubicin/carboplatin 05/10/2019, repeated every 28 days  (a) echocardiogram 05/08/2017 shows an ejection fraction in the 55-60% range.  (b) patient is interested in [id number] if available through the "right to try" program  (c) HER-2 determination requested on prior pathology 06/04/2019--negative results  (d) considering [facility] , MDA or Coupland study   PLAN: Aldea is having a hard time getting through these treatments and in addition she is significantly thrombocytopenic.  We are going to drop the carboplatin (I thought I had done that the last cycle but actually did not do).  The AUC will go down from 5.4 which is a 20% dose decrease.  I am hoping this will make it a little bit easier for her and also help with the platelet count.  On the other hand I think there are some clinical signs of response.  She has not required paracentesis now for 2 months.  Her nausea and vomiting are better.  She is not constipated or obstructed.  I am setting her up for a CT scan of the abdomen  07/30/2019 just to make sure we are making progress but I expect that we will proceed with her fourth cycle on 08/02/2019 as planned at 20% carboplatin dose reduction.  I have encouraged her to catch up with her contact physician at [facility] because I think perhaps 3 more cycles of recurrent chemo may be all she will be able to tolerate  She knows to call for any other issue that may develop before the next visit  Total encounter time 30 minutes.Sarajane Jews C. Elease Swarm, MD 07/19/19 3:26 PM Medical Oncology and Hematology [facility] [address] [place], [place] [address] Tel. [phone]    Fax. [phone]   I, Wilburn Mylar, am acting as scribe for Dr. Virgie Dad. Collyns Mcquigg.  I, Lurline Del MD, have reviewed the above documentation for accuracy and completeness, and I agree with the above.   *Total Encounter Time as defined by the Centers for Medicare and Medicaid Services includes, in addition to the face-to-face time of a patient visit (documented in the note above) non-face-to-face time: obtaining and reviewing outside history, ordering and reviewing medications, tests or procedures, care coordination (communications with other health care professionals or caregivers) and documentation in the medical record.

## 2019-07-20 ENCOUNTER — Encounter: Payer: Self-pay | Admitting: Oncology

## 2019-07-20 ENCOUNTER — Other Ambulatory Visit: Payer: Self-pay | Admitting: Oncology

## 2019-07-20 ENCOUNTER — Other Ambulatory Visit: Payer: Self-pay | Admitting: Adult Health

## 2019-07-20 ENCOUNTER — Telehealth: Payer: Self-pay | Admitting: Oncology

## 2019-07-20 LAB — CA 125: Cancer Antigen (CA) 125: 4798 U/mL — ABNORMAL HIGH (ref 0.0–38.1)

## 2019-07-20 LAB — FERRITIN: Ferritin: 1153 ng/mL — ABNORMAL HIGH (ref 11–307)

## 2019-07-20 NOTE — Telephone Encounter (Signed)
No 6/3 los. No changes made to pt's schedule.  

## 2019-07-25 ENCOUNTER — Other Ambulatory Visit: Payer: Self-pay

## 2019-07-25 ENCOUNTER — Telehealth: Payer: Self-pay

## 2019-07-25 ENCOUNTER — Encounter: Payer: Self-pay | Admitting: Physical Therapy

## 2019-07-25 ENCOUNTER — Ambulatory Visit: Payer: 59 | Attending: Gynecologic Oncology | Admitting: Physical Therapy

## 2019-07-25 DIAGNOSIS — R109 Unspecified abdominal pain: Secondary | ICD-10-CM | POA: Insufficient documentation

## 2019-07-25 DIAGNOSIS — C762 Malignant neoplasm of abdomen: Secondary | ICD-10-CM | POA: Diagnosis present

## 2019-07-25 DIAGNOSIS — M62838 Other muscle spasm: Secondary | ICD-10-CM | POA: Insufficient documentation

## 2019-07-25 NOTE — Telephone Encounter (Signed)
Nutrition Assessment   Reason for Assessment:  Patient identified on Malnutrition Screening report for weight loss and poor appetite   ASSESSMENT:  54 year old female with metastatic ovarian cancer.  Patient receiving liposomal doxorubicin, carboplatin.    Spoke with patient via phone and introduce self and service at [facility].  Patient reports that she is trying to eat as much as she can, frequently during the day.  Reports that she usually starts her day with lactaid milk with chocolate protein powder mixed in.  Sometimes with have an egg with toast and honey.  Lunch yesterday was hummus with pita but had upset stomach later.  Last night ate macaroni and cheese and soup.  After dinner was able to tolerate ice cream.  Reports that she is tired of drinking oral nutrition supplements.    Noted no paracentesis in 2 months, nausea is better and no constipation or obstruction  Medications: bentyl, reglan, senna, vit B 12, MVI, compazine, dexamethasone   Labs: glucose 120   Anthropometrics:   Height: 63 inches Weight: 93 lb 3.2 oz on 6/3 06/11/19 100 lb 3/16 115 lb BMI: 16  19% weight loss in the last 3 months, significant  Estimated Energy Needs  Kcals: 1260-1400 Protein: 63-70 g Fluid: > 1.2 L   NUTRITION DIAGNOSIS: Inadequate oral intake related to cancer and cancer related treatment side effects as evidenced by 19% weight loss in the 3 months   INTERVENTION:  Reviewed strategies to increase calories and protein Discussed ways to flavor oral nutrition supplements Will provide samples of [employer] shake on 6/17 during next treatment.   Will mail smoothie receipes.   Contact information provided.   Next Visit:  Patient to contact RD  Kashawn Manzano B. Zenia Resides, [place], [place] Registered Dietitian [phone] (pager)

## 2019-07-25 NOTE — Therapy (Signed)
[facility] [address], [place], [place], [address] Phone: [phone]   Fax:  [phone]  Physical Therapy Treatment  Patient Details  Name: Kathryn Ramsey MRN: [id number] Date of Birth: 04-Apr-1965 Referring Provider (PT): Dr. Lurline Del   Encounter Date: 07/25/2019  PT End of Session - 07/25/19 1314    Visit Number  6    Date for PT Re-Evaluation  08/15/19    Authorization Type  UHC    Authorization - Visit Number  6    Authorization - Number of Visits  23    PT Start Time  1230    PT Stop Time  1310    PT Time Calculation (min)  40 min    Activity Tolerance  Patient tolerated treatment well    Behavior During Therapy  [facility] for tasks assessed/performed       Past Medical History:  Diagnosis Date  . Acute sensory neuropathy 06/26/2015  . Allergy   . Anemia   . Asthma    illness induced asthma  . Blood transfusion without reported diagnosis    at age 54 years old D/T surgery to femur being crushed  . Complication of anesthesia    hx. of allergic to ether (had surgery at 54 years and had reaction to the ether  . Heart murmur    pt, states had a "working heart murmur"  . History of kidney stones   . Neutropenia, drug-induced ([place]) 03/10/2018  . PONV (postoperative nausea and vomiting)   . Skin cancer     Past Surgical History:  Procedure Laterality Date  . 3 laporscopic proceedures    . ABDOMINAL HYSTERECTOMY     2010  . CHOLECYSTECTOMY     2010  . DILATION AND CURETTAGE OF UTERUS     1997  . IR IMAGING GUIDED PORT INSERTION  03/08/2018  . LAPAROSCOPY N/A 03/27/2015   Procedure: DIAGNOSTIC LAPAROSCOPY ;  Surgeon: [name], MD;  Location: [facility];  Service: Gynecology;  Laterality: N/A;  . LAPAROSCOPY N/A 02/24/2016   Procedure: LAPAROSCOPY DIAGNOSTIC WITH PERITONEAL WASHINGS AND PERITONEAL BIOPSY;  Surgeon: [name], MD;  Location: [facility];  Service: Gynecology;  Laterality: N/A;  . LAPAROTOMY N/A  03/27/2015   Procedure: EXPLORATORY LAPAROTOMY, BILATERAL SALPINGO OOPHORECTOMY, OMENTECTOMY, RADICAL TUMOR DEBULKING;  Surgeon: [name], MD;  Location: [facility];  Service: Gynecology;  Laterality: N/A;  . sinus surgery 1994      There were no vitals filed for this visit.  Subjective Assessment - 07/25/19 1238    Subjective  I am eating more. I have had a bowel movement 2 days in a row. I feel sticky inside and abdomen feels tight. I am not vomiting all the time.    Patient Stated Goals  improve mobility of the intestines    Currently in Pain?  Yes    Pain Score  6     Pain Location  Abdomen    Pain Orientation  Mid    Pain Descriptors / Indicators  Discomfort    Pain Type  Chronic pain    Pain Onset  More than a month ago    Pain Frequency  Constant    Aggravating Factors   walking around    Pain Relieving Factors  sitting down    Multiple Pain Sites  No         [facility] PT Assessment - 07/25/19 0001      Observation/Other Assessments-Edema    Edema  Circumferential      Circumferential Edema   Circumferential - Right  prior to treamtent at umbilicus is 74 cm and after 74 cm                    [facility] Adult PT Treatment/Exercise - 07/25/19 0001      Manual Therapy   Manual Therapy  Myofascial release;Soft tissue mobilization    Manual therapy comments  moist heat while manual work is done    Soft tissue mobilization  gentle circular motion around the abdomen to promote peristalic motion of the abdomen    Myofascial Release  lifting up the intestines from the bladder, mobilizatio no fthe ilioceccal valve, release of the left upper abdomen, release of the umbilicus               PT Short Term Goals - 06/25/19 1104      PT SHORT TERM GOAL #1   Title  able to reports pain level decreased by 1 level after treatment due to improved visceral mobility    Time  4    Period  Weeks    Status  Achieved      PT SHORT TERM GOAL #2   Title  understand correct  toileting technique to assist stool to come out with relaxation of the pelvic floor    Time  4    Period  Weeks    Status  Achieved        PT Long Term Goals - 07/25/19 1318      PT LONG TERM GOAL #1   Title  understand how to perfrom lower abdominal fascial work to reduce the intestines from sticking together due to her cancer    Time  12    Period  Weeks    Status  On-going      PT LONG TERM GOAL #2   Title  pain level after treatment decreased by 2 pain levels due to improved visceral mobility    Time  12    Period  Weeks    Status  On-going      PT LONG TERM GOAL #3   Title  abdominal circumference reduced >/= 1 cm after treatment due to improved visceral mobility    Time  12    Period  Weeks    Status  On-going            Plan - 07/25/19 1315    Clinical Impression Statement  Patient able to have a bowel movement daily for several days. After manual work the abdomen was softer. No difference in abdominal circumference after treatment due to patient gaining a pound. Patient is now able to eat more food and is not vomiting as much. This may be due to her not having an intestinal blockage. Patient will benefit from skilled therapy to improve abdominal, visereal and intestinal mobility to prevent constipation and reduce pain and comfort.    Personal Factors and Comorbidities  Comorbidity 3+;Age    Comorbidities  abdominal carcinomatosis; cancer of right fallopian tube; carcinomatosis peritonei; currently on chemotherapy; abdominal hysterectomy    Examination-Activity Limitations  Sleep;Bend;Locomotion Level    Examination-Participation Restrictions  Community Activity;Interpersonal Relationship    Stability/Clinical Decision Making  Evolving/Moderate complexity    Rehab Potential  Good    PT Frequency  1x / week    PT Duration  12 weeks    PT Treatment/Interventions  Moist Heat;Therapeutic activities;Therapeutic exercise;Neuromuscular re-education;Manual  techniques;Patient/family education    PT  Next Visit Plan  visceral mobilization; mobilization of the diaphragm; rib cage mobilization; measure abdominal circumference    Consulted and Agree with Plan of Care  Patient       Patient will benefit from skilled therapeutic intervention in order to improve the following deficits and impairments:  Pain, Increased fascial restricitons, Decreased activity tolerance  Visit Diagnosis: Abdominal pain, unspecified abdominal location  Muscle spasm  Abdominal carcinomatosis [facility])     Problem List Patient Active Problem List   Diagnosis Date Noted  . Dysphagia 05/25/2019  . Neuropathy due to chemotherapeutic drug ([place]) 07/24/2018  . Anemia due to antineoplastic chemotherapy 05/22/2018  . Port-A-Cath in place 05/01/2018  . Neutropenia, drug-induced ([place]) 03/10/2018  . Diarrhea 08/02/2017  . Lichen sclerosus et atrophicus of the vulva 03/31/2016  . Abdominal wall mass of right lower quadrant 03/08/2016  . 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 Goals of care, counseling/discussion 02/27/2016  . Elevated CA-125 02/24/2016  . Vaginal dryness, menopausal 10/01/2015  . Drug-induced peripheral neuropathy ([place]) 09/02/2015  . Leukopenia due to antineoplastic chemotherapy ([place]) 09/02/2015  . Chemotherapy-induced neuropathy ([place]) 08/19/2015  . Cellulitis 07/03/2015  . Acute sensory neuropathy 06/26/2015  . High risk medication use 06/21/2015  . Facial paresthesia 06/18/2015  . Genetic testing 06/16/2015  . Restless legs 05/14/2015  . Family history of breast cancer in female 04/29/2015  . Chemotherapy-induced nausea 04/22/2015  . Chemotherapy induced neutropenia ([place]) 04/22/2015  . 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 History of hysterectomy for benign disease 03/21/2015  . IBS (irritable bowel syndrome) 03/21/2015  . Dense breast tissue 03/21/2015  . History of cholecystectomy 03/21/2015  . Abdominal carcinomatosis ([place])  03/21/2015    Earlie Counts, PT 07/25/19 1:19 PM   [facility] [address] [place], [place], [address] Phone: [phone]   Fax:  [phone]  Name: Taneia Mealor MRN: [id number] Date of Birth: June 08, 1965

## 2019-07-26 ENCOUNTER — Encounter: Payer: Self-pay | Admitting: Oncology

## 2019-07-30 ENCOUNTER — Other Ambulatory Visit: Payer: Self-pay | Admitting: Oncology

## 2019-07-30 ENCOUNTER — Other Ambulatory Visit: Payer: Self-pay

## 2019-07-30 ENCOUNTER — Encounter (HOSPITAL_COMMUNITY): Payer: Self-pay

## 2019-07-30 ENCOUNTER — Encounter: Payer: Self-pay | Admitting: Oncology

## 2019-07-30 ENCOUNTER — Ambulatory Visit (HOSPITAL_COMMUNITY)
Admission: RE | Admit: 2019-07-30 | Discharge: 2019-07-30 | Disposition: A | Discharge status: Home / Self Care | Payer: 59 | Source: Ambulatory Visit | Attending: Oncology | Admitting: Oncology

## 2019-07-30 DIAGNOSIS — C786 Secondary malignant neoplasm of retroperitoneum and peritoneum: Secondary | ICD-10-CM | POA: Insufficient documentation

## 2019-07-30 DIAGNOSIS — C801 Malignant (primary) neoplasm, unspecified: Secondary | ICD-10-CM | POA: Insufficient documentation

## 2019-07-30 MED ORDER — SODIUM CHLORIDE (PF) 0.9 % IJ SOLN
INTRAMUSCULAR | Status: AC
  Filled 2019-07-30: qty 50

## 2019-07-30 MED ORDER — IOHEXOL 300 MG/ML  SOLN
100.0000 mL | Freq: Once | INTRAMUSCULAR | Status: AC | PRN
  Administered 2019-07-30: 100 mL via INTRAVENOUS

## 2019-08-01 ENCOUNTER — Other Ambulatory Visit: Payer: Self-pay | Admitting: Oncology

## 2019-08-01 ENCOUNTER — Ambulatory Visit: Payer: 59 | Admitting: Physical Therapy

## 2019-08-01 ENCOUNTER — Other Ambulatory Visit: Payer: Self-pay

## 2019-08-01 ENCOUNTER — Encounter: Payer: Self-pay | Admitting: Physical Therapy

## 2019-08-01 DIAGNOSIS — C762 Malignant neoplasm of abdomen: Secondary | ICD-10-CM

## 2019-08-01 DIAGNOSIS — R109 Unspecified abdominal pain: Secondary | ICD-10-CM

## 2019-08-01 DIAGNOSIS — M62838 Other muscle spasm: Secondary | ICD-10-CM

## 2019-08-01 NOTE — Therapy (Signed)
[facility] [address] [place], [place], [address] Phone: [phone]   Fax:  [phone]  Physical Therapy Treatment  Patient Details  Name: Kathryn Ramsey MRN: [id number] Date of Birth: 18-Oct-1965 Referring Provider (PT): Dr. Lurline Del   Encounter Date: 08/01/2019   PT End of Session - 08/01/19 1318    Visit Number 7    Date for PT Re-Evaluation 08/15/19    Authorization Type UHC    Authorization - Visit Number 7    Authorization - Number of Visits 23    PT Start Time 2841    PT Stop Time 1308    PT Time Calculation (min) 38 min    Activity Tolerance Patient tolerated treatment well    Behavior During Therapy [facility] for tasks assessed/performed           Past Medical History:  Diagnosis Date  . Acute sensory neuropathy 06/26/2015  . Allergy   . Anemia   . Asthma    illness induced asthma  . Blood transfusion without reported diagnosis    at age 48 years old D/T surgery to femur being crushed  . 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 Heart murmur    pt, states had a "working heart murmur"  . History of kidney stones   . Neutropenia, drug-induced ([place]) 03/10/2018  . PONV (postoperative nausea and vomiting)   . Skin cancer     Past Surgical History:  Procedure Laterality Date  . 3 laporscopic proceedures    . ABDOMINAL HYSTERECTOMY     2010  . CHOLECYSTECTOMY     2010  . DILATION AND CURETTAGE OF UTERUS     1997  . IR IMAGING GUIDED PORT INSERTION  03/08/2018  . LAPAROSCOPY N/A 03/27/2015   Procedure: DIAGNOSTIC LAPAROSCOPY ;  Surgeon: [name], MD;  [facility]: [facility];  Service: Gynecology;  Laterality: N/A;  . LAPAROSCOPY N/A 02/24/2016   Procedure: LAPAROSCOPY DIAGNOSTIC WITH PERITONEAL WASHINGS AND PERITONEAL BIOPSY;  Surgeon: [name], MD;  [facility];  Service: Gynecology;  Laterality: N/A;  . LAPAROTOMY N/A  03/27/2015   Procedure: EXPLORATORY LAPAROTOMY, BILATERAL SALPINGO OOPHORECTOMY, OMENTECTOMY, RADICAL TUMOR DEBULKING;  Surgeon: [name], MD;  [facility];  Service: Gynecology;  Laterality: N/A;  . sinus surgery 1994      There were no vitals filed for this visit.   Subjective Assessment - 08/01/19 1237    Subjective Today I feel very weak. I had a good cleansing bowel movement today. It takes time for me to recover from bowel movement. On Monday I had a CT scan. Things are not worse. I am eating better, better bowel movements. Patient is going to have a thorosentesis next week to have the fluid drained. I have chemotherapy tomorrow. After last wednesday, I felt more relaxed after therapy and I had 3 bowel movements daily.    Patient Stated Goals improve mobility of the intestines    Currently in Pain? Yes    Pain Score 2     Pain Location Abdomen    Pain Orientation Mid    Pain Descriptors / Indicators Discomfort    Pain Type Chronic pain    Pain Onset More than a month ago    Pain Frequency Constant    Aggravating Factors  after a bowel movement, walking around    Pain Relieving Factors sitting down  Multiple Pain Sites No                             [facility] Adult PT Treatment/Exercise - 08/01/19 0001      Manual Therapy   Manual Therapy Myofascial release;Soft tissue mobilization    Manual therapy comments moist heat while manual work is done    Soft tissue mobilization gentle circular motion around the abdomen to promote peristalic motion of the abdomen    Myofascial Release fascial release of the urogenital diaphgram and respiratory diaphragm, release of the scar along the lower abdomen, lifting up the intestines from the bladder, release around the umbilicus                    PT Short Term Goals - 06/25/19 1104      PT SHORT TERM GOAL #1   Title able to reports pain level decreased by 1 level after treatment due to improved visceral  mobility    Time 4    Period Weeks    Status Achieved      PT SHORT TERM GOAL #2   Title understand correct toileting technique to assist stool to come out with relaxation of the pelvic floor    Time 4    Period Weeks    Status Achieved             PT Long Term Goals - 07/25/19 1318      PT LONG TERM GOAL #1   Title understand how to perfrom lower abdominal fascial work to reduce the intestines from sticking together due to her cancer    Time 12    Period Weeks    Status On-going      PT LONG TERM GOAL #2   Title pain level after treatment decreased by 2 pain levels due to improved visceral mobility    Time 12    Period Weeks    Status On-going      PT LONG TERM GOAL #3   Title abdominal circumference reduced >/= 1 cm after treatment due to improved visceral mobility    Time 12    Period Weeks    Status On-going                 Plan - 08/01/19 1318    Clinical Impression Statement Patient reports she had 3 bowel movement after last visit without difficulty. Patient abdomen was softer after therapy and increased movement intestinally. No bowel sounds heard but she had a bowel movement prior to therapy. Patient is eating more, not getting sick and more regular bowel movements. Patient will be having chemotherapy tomorrow. Patient will benefit from skilled therapy to improve abdominal, viseral, and intestinal mobility to prevernt constipation and reduce pain.    Personal Factors and Comorbidities Comorbidity 3+;Age    Comorbidities abdominal carcinomatosis; cancer of right fallopian tube; carcinomatosis peritonei; currently on chemotherapy; abdominal hysterectomy    Examination-Activity Limitations Sleep;Bend;Locomotion Level    Examination-Participation Restrictions Community Activity;Interpersonal Relationship    Rehab Potential Good    PT Duration 12 weeks    PT Treatment/Interventions Moist Heat;Therapeutic activities;Therapeutic exercise;Neuromuscular  re-education;Manual techniques;Patient/family education    PT Next Visit Plan visceral mobilization; mobilization of the diaphragm; rib cage mobilization; measure abdominal circumference; write renewal    Consulted and Agree with Plan of Care Patient           Patient will benefit from skilled therapeutic intervention in order  to improve the following deficits and impairments:  Pain, Increased fascial restricitons, Decreased activity tolerance  Visit Diagnosis: Abdominal pain, unspecified abdominal location  Muscle spasm  Abdominal carcinomatosis [facility])     Problem List Patient Active Problem List   Diagnosis Date Noted  . Dysphagia 05/25/2019  . Neuropathy due to chemotherapeutic drug ([place]) 07/24/2018  . Anemia due to antineoplastic chemotherapy 05/22/2018  . Port-A-Cath in place 05/01/2018  . Neutropenia, drug-induced ([place]) 03/10/2018  . Diarrhea 08/02/2017  . Lichen sclerosus et atrophicus of the vulva 03/31/2016  . Abdominal wall mass of right lower quadrant 03/08/2016  . 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 Goals of care, counseling/discussion 02/27/2016  . Elevated CA-125 02/24/2016  . Vaginal dryness, menopausal 10/01/2015  . Drug-induced peripheral neuropathy ([place]) 09/02/2015  . Leukopenia due to antineoplastic chemotherapy ([place]) 09/02/2015  . Chemotherapy-induced neuropathy ([place]) 08/19/2015  . Cellulitis 07/03/2015  . Acute sensory neuropathy 06/26/2015  . High risk medication use 06/21/2015  . Facial paresthesia 06/18/2015  . Genetic testing 06/16/2015  . Restless legs 05/14/2015  . Family history of breast cancer in female 04/29/2015  . Chemotherapy-induced nausea 04/22/2015  . Chemotherapy induced neutropenia ([place]) 04/22/2015  . 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 History of hysterectomy for benign disease 03/21/2015  . IBS (irritable bowel syndrome) 03/21/2015  . Dense breast tissue 03/21/2015  . History of cholecystectomy  03/21/2015  . Abdominal carcinomatosis ([place]) 03/21/2015    Earlie Counts, PT 08/01/19 1:25 PM   [facility] [address], [place], [place], [address] Phone: [phone]   Fax:  [phone]  Name: Kathryn Ramsey MRN: [id number] Date of Birth: 03-20-1965

## 2019-08-02 ENCOUNTER — Encounter: Payer: Self-pay | Admitting: Adult Health

## 2019-08-02 ENCOUNTER — Inpatient Hospital Stay: Payer: 59

## 2019-08-02 ENCOUNTER — Other Ambulatory Visit: Payer: 59

## 2019-08-02 ENCOUNTER — Other Ambulatory Visit: Payer: Self-pay

## 2019-08-02 ENCOUNTER — Inpatient Hospital Stay (HOSPITAL_BASED_OUTPATIENT_CLINIC_OR_DEPARTMENT_OTHER): Payer: 59 | Admitting: Adult Health

## 2019-08-02 ENCOUNTER — Other Ambulatory Visit: Payer: Self-pay | Admitting: Oncology

## 2019-08-02 ENCOUNTER — Encounter: Payer: Self-pay | Admitting: *Deleted

## 2019-08-02 VITALS — BP 93/69 | HR 76 | Temp 98.5°F | Resp 18 | Ht 63.0 in | Wt 90.9 lb

## 2019-08-02 VITALS — BP 91/60 | HR 64 | Temp 98.7°F | Resp 16

## 2019-08-02 DIAGNOSIS — C5701 Malignant neoplasm of right fallopian tube: Secondary | ICD-10-CM

## 2019-08-02 DIAGNOSIS — C786 Secondary malignant neoplasm of retroperitoneum and peritoneum: Secondary | ICD-10-CM

## 2019-08-02 DIAGNOSIS — C762 Malignant neoplasm of abdomen: Secondary | ICD-10-CM

## 2019-08-02 DIAGNOSIS — Z7189 Other specified counseling: Secondary | ICD-10-CM

## 2019-08-02 DIAGNOSIS — D649 Anemia, unspecified: Secondary | ICD-10-CM | POA: Diagnosis not present

## 2019-08-02 DIAGNOSIS — Z95828 Presence of other vascular implants and grafts: Secondary | ICD-10-CM

## 2019-08-02 DIAGNOSIS — Z5111 Encounter for antineoplastic chemotherapy: Secondary | ICD-10-CM | POA: Diagnosis not present

## 2019-08-02 LAB — CBC WITH DIFFERENTIAL/PLATELET
Abs Immature Granulocytes: 0.02 10*3/uL (ref 0.00–0.07)
Basophils Absolute: 0 10*3/uL (ref 0.0–0.1)
Basophils Relative: 0 %
Eosinophils Absolute: 0 10*3/uL (ref 0.0–0.5)
Eosinophils Relative: 0 %
HCT: 22.8 % — ABNORMAL LOW (ref 36.0–46.0)
Hemoglobin: 7.5 g/dL — ABNORMAL LOW (ref 12.0–15.0)
Immature Granulocytes: 0 %
Lymphocytes Relative: 20 %
Lymphs Abs: 0.9 10*3/uL (ref 0.7–4.0)
MCH: 31.3 pg (ref 26.0–34.0)
MCHC: 32.9 g/dL (ref 30.0–36.0)
MCV: 95 fL (ref 80.0–100.0)
Monocytes Absolute: 0.7 10*3/uL (ref 0.1–1.0)
Monocytes Relative: 14 %
Neutro Abs: 3 10*3/uL (ref 1.7–7.7)
Neutrophils Relative %: 66 %
Platelets: 251 10*3/uL (ref 150–400)
RBC: 2.4 MIL/uL — ABNORMAL LOW (ref 3.87–5.11)
RDW: 21.5 % — ABNORMAL HIGH (ref 11.5–15.5)
WBC: 4.6 10*3/uL (ref 4.0–10.5)
nRBC: 0 % (ref 0.0–0.2)

## 2019-08-02 LAB — COMPREHENSIVE METABOLIC PANEL
ALT: 7 U/L (ref 0–44)
AST: 9 U/L — ABNORMAL LOW (ref 15–41)
Albumin: 3.3 g/dL — ABNORMAL LOW (ref 3.5–5.0)
Alkaline Phosphatase: 98 U/L (ref 38–126)
Anion gap: 12 (ref 5–15)
BUN: 15 mg/dL (ref 6–20)
CO2: 26 mmol/L (ref 22–32)
Calcium: 8.8 mg/dL — ABNORMAL LOW (ref 8.9–10.3)
Chloride: 99 mmol/L (ref 98–111)
Creatinine, Ser: 0.81 mg/dL (ref 0.44–1.00)
GFR calc Af Amer: >60 mL/min (ref >60)
GFR calc non Af Amer: >60 mL/min (ref >60)
Glucose, Bld: 100 mg/dL — ABNORMAL HIGH (ref 70–99)
Potassium: 3.5 mmol/L (ref 3.5–5.1)
Sodium: 137 mmol/L (ref 135–145)
Total Bilirubin: 0.3 mg/dL (ref 0.3–1.2)
Total Protein: 7.2 g/dL (ref 6.5–8.1)

## 2019-08-02 LAB — PREPARE RBC (CROSSMATCH)

## 2019-08-02 MED ORDER — ACETAMINOPHEN 325 MG PO TABS
ORAL_TABLET | ORAL | Status: AC
  Filled 2019-08-02: qty 2

## 2019-08-02 MED ORDER — SODIUM CHLORIDE 0.9 % IV SOLN
266.7000 mg | Freq: Once | INTRAVENOUS | Status: AC
  Administered 2019-08-02: 270 mg via INTRAVENOUS
  Filled 2019-08-02: qty 27

## 2019-08-02 MED ORDER — PALONOSETRON HCL INJECTION 0.25 MG/5ML
INTRAVENOUS | Status: AC
  Filled 2019-08-02: qty 5

## 2019-08-02 MED ORDER — DOXORUBICIN HCL LIPOSOMAL CHEMO INJECTION 2 MG/ML
30.0000 mg/m2 | Freq: Once | INTRAVENOUS | Status: AC
  Administered 2019-08-02: 40 mg via INTRAVENOUS
  Filled 2019-08-02: qty 20

## 2019-08-02 MED ORDER — ACETAMINOPHEN 325 MG PO TABS
650.0000 mg | ORAL_TABLET | Freq: Once | ORAL | Status: AC
  Administered 2019-08-02: 650 mg via ORAL

## 2019-08-02 MED ORDER — PEGFILGRASTIM 6 MG/0.6ML ~~LOC~~ PSKT
6.0000 mg | PREFILLED_SYRINGE | Freq: Once | SUBCUTANEOUS | Status: AC
  Administered 2019-08-02: 6 mg via SUBCUTANEOUS

## 2019-08-02 MED ORDER — LORATADINE 10 MG PO TABS
10.0000 mg | ORAL_TABLET | Freq: Once | ORAL | Status: AC
  Administered 2019-08-02: 10 mg via ORAL

## 2019-08-02 MED ORDER — SODIUM CHLORIDE 0.9% FLUSH
3.0000 mL | INTRAVENOUS | Status: DC | PRN
  Filled 2019-08-02: qty 10

## 2019-08-02 MED ORDER — DIPHENHYDRAMINE HCL 25 MG PO CAPS: 25.0000 mg | ORAL_CAPSULE | Freq: Once | ORAL | Status: DC

## 2019-08-02 MED ORDER — LORATADINE 10 MG PO TABS
ORAL_TABLET | ORAL | Status: AC
  Filled 2019-08-02: qty 1

## 2019-08-02 MED ORDER — SODIUM CHLORIDE 0.9% FLUSH
10.0000 mL | Freq: Once | INTRAVENOUS | Status: AC
  Administered 2019-08-02: 10 mL
  Filled 2019-08-02: qty 10

## 2019-08-02 MED ORDER — EPOETIN ALFA-EPBX 40000 UNIT/ML IJ SOLN
40000.0000 [IU] | Freq: Once | INTRAMUSCULAR | Status: AC
  Administered 2019-08-02: 40000 [IU] via SUBCUTANEOUS

## 2019-08-02 MED ORDER — PALONOSETRON HCL INJECTION 0.25 MG/5ML
0.2500 mg | Freq: Once | INTRAVENOUS | Status: AC
  Administered 2019-08-02: 0.25 mg via INTRAVENOUS

## 2019-08-02 MED ORDER — PEGFILGRASTIM 6 MG/0.6ML ~~LOC~~ PSKT
PREFILLED_SYRINGE | SUBCUTANEOUS | Status: AC
  Filled 2019-08-02: qty 0.6

## 2019-08-02 MED ORDER — SODIUM CHLORIDE 0.9 % IV SOLN
10.0000 mg | Freq: Once | INTRAVENOUS | Status: AC
  Administered 2019-08-02: 10 mg via INTRAVENOUS
  Filled 2019-08-02: qty 10

## 2019-08-02 MED ORDER — SODIUM CHLORIDE 0.9 % IV SOLN
150.0000 mg | Freq: Once | INTRAVENOUS | Status: AC
  Administered 2019-08-02: 150 mg via INTRAVENOUS
  Filled 2019-08-02: qty 150

## 2019-08-02 MED ORDER — SODIUM CHLORIDE 0.9% FLUSH
10.0000 mL | INTRAVENOUS | Status: DC | PRN
  Administered 2019-08-02: 10 mL
  Filled 2019-08-02: qty 10

## 2019-08-02 MED ORDER — EPOETIN ALFA-EPBX 40000 UNIT/ML IJ SOLN
INTRAMUSCULAR | Status: AC
  Filled 2019-08-02: qty 1

## 2019-08-02 MED ORDER — HEPARIN SOD (PORK) LOCK FLUSH 100 UNIT/ML IV SOLN
250.0000 [IU] | INTRAVENOUS | Status: DC | PRN
  Filled 2019-08-02: qty 5

## 2019-08-02 MED ORDER — FAMOTIDINE IN NACL 20-0.9 MG/50ML-% IV SOLN
20.0000 mg | Freq: Once | INTRAVENOUS | Status: AC
  Administered 2019-08-02: 20 mg via INTRAVENOUS

## 2019-08-02 MED ORDER — SODIUM CHLORIDE 0.9% IV SOLUTION
250.0000 mL | Freq: Once | INTRAVENOUS | Status: AC
  Administered 2019-08-02: 250 mL via INTRAVENOUS
  Filled 2019-08-02: qty 250

## 2019-08-02 MED ORDER — HEPARIN SOD (PORK) LOCK FLUSH 100 UNIT/ML IV SOLN
500.0000 [IU] | Freq: Once | INTRAVENOUS | Status: AC | PRN
  Administered 2019-08-02: 500 [IU]
  Filled 2019-08-02: qty 5

## 2019-08-02 MED ORDER — FAMOTIDINE IN NACL 20-0.9 MG/50ML-% IV SOLN
INTRAVENOUS | Status: AC
  Filled 2019-08-02: qty 50

## 2019-08-02 MED ORDER — DEXTROSE 5 % IV SOLN
Freq: Once | INTRAVENOUS | Status: AC
  Filled 2019-08-02: qty 250

## 2019-08-02 NOTE — Progress Notes (Signed)
Per Wilber Bihari, NP okay to treat pt with Hgb of 7.5.  Pt will be receiving 1 unit PRBC's after treatment today.

## 2019-08-02 NOTE — Patient Instructions (Signed)
[facility] Discharge Instructions for Patients Receiving Chemotherapy  Today you received the following chemotherapy agents Doxil and Carboplatin  To help prevent nausea and vomiting after your treatment, we encourage you to take your nausea medication as directed.   If you develop nausea and vomiting that is not controlled by your nausea medication, call the clinic.   BELOW ARE SYMPTOMS THAT SHOULD BE REPORTED IMMEDIATELY:  *FEVER GREATER THAN 100.5 F  *CHILLS WITH OR WITHOUT FEVER  NAUSEA AND VOMITING THAT IS NOT CONTROLLED WITH YOUR NAUSEA MEDICATION  *UNUSUAL SHORTNESS OF BREATH  *UNUSUAL BRUISING OR BLEEDING  TENDERNESS IN MOUTH AND THROAT WITH OR WITHOUT PRESENCE OF ULCERS  *URINARY PROBLEMS  *BOWEL PROBLEMS  UNUSUAL RASH Items with * indicate a potential emergency and should be followed up as soon as possible.  Feel free to call the clinic should you have any questions or concerns. The clinic phone number is [phone].  Please show the CHEMO ALERT CARD at check-in to the Emergency Department and triage nurse.   

## 2019-08-02 NOTE — Progress Notes (Addendum)
[place]  Telephone:(336) [phone] Fax:(336) [phone]    ID: Kathryn Ramsey DOB: 06-09-1965  MR#: [id number]  HCW#:[id number]  Patient Care Team: Patient, No Pcp Per as PCP - General (General Practice) [name], MD as Consulting Physician (Obstetrics and Gynecology) Juanita Craver, MD as Consulting Physician (Gastroenterology) Servando Salina, MD as Consulting Physician (Obstetrics and Gynecology) Nickie Retort, MD as Consulting Physician (Urology) Gillis Ends, MD as Referring Physician (Obstetrics and Gynecology) Mettu, Baltazar Apo, MD as Referring Physician (Oncology) OTHER MD: Moss Mc [phone]); Floyde Parkins; Gerhard Perches Matulonis [phone])  NOTE: Patient does not want to be informed of her CA-125.  CHIEF COMPLAINT:  ovarian cancer  CURRENT TREATMENT: Liposomal doxorubicin, carboplatin   INTERVAL HISTORY: Kathryn Ramsey returns today for follow up of her metastatic ovarian cancer.    Kathryn Ramsey is receiving liposomal doxorubicin and Carboplatin given ond ay 1 of a 28 day cycle with Onpro support.  Today is day 1 cycle 4 a  She is also scheduled for bilateral screening mammography on 08/13/2019.  She had a CT scan of the abdomen and pelvis on 6/14 that showed an increasing left pleural effusion.  She is schedueld for thoracentesis on 6/22 at [facility].     REVIEW OF SYSTEMS: Kathryn Ramsey is feeling moderately well.  She is weak and fatigued.  She has been constipated, but does have bowel movements.  She has some mild stomach distention.  She is down 8 pounds in the past four weeks.  She was recommended to add a low dose steroid daily.  She is eating more since this was added, which was about 2-3 weeks ago.    Kathryn Ramsey recently heard from Kathryn Ramsey about a clinical trial that she may be eligible for.  This clinical trial information will be sent to Dr. Jana Hakim, and she is speaking with the clinical trial coordinator.    Kathryn Ramsey doesn't have any other  issues.  She is without fever or chills.  She has no bladder changes, cough, chest pain, palpitations, nausea, or vomiting.  A detailed ROS was otherwise non contributory.    HISTORY OF PRESENT ILLNESS: From Dr. Serita Grit original intake note 03/17/2015:  "Kathryn Ramsey is a very pleasant G4P4 who is seen in consultation at the request of Dr Collene Mares and Dr Garwin Brothers for peritoneal carcinomatosis. The patient has a history of a workup by urology for gross hematuria which included a pelvic examine was suggested for extrinsic mass effect on the bladder on cystoscopy. Cystoscopy took place on in December 2016. The patient denies abdominal pain bloating early satiety or abdominal distention.  On 08/03/2015 at [facility] she underwent a CT scan of the abdomen and pelvis as ordered by Dr Baruch Gouty. This revealed a 1.4 cm low attenuation lesion on the posterior right hepatic lobe suggestive of a hemangioma. Status post hysterectomy. No ovarian masses. Moderate ascites. Omental caking seen in the lateral left abdomen and pelvis. Peritoneal nodularity in the left lateral pelvic cul-de-sac. No gross extrinsic compression on the bladder was identified on imaging.  The patient was then seen by her gastroenterologist, Dr. Collene Mares, who performed a colonoscopy which was unremarkable.  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 She was treated  adjuvantly with carboplatin and paclitaxel, as detailed below. Her CA 125 dropped from 1226 on 03/20/2015 to 26.1 by 06/16/2015.  Her subsequent history is as detailed above.   PAST MEDICAL HISTORY: Past Medical History:  Diagnosis Date    Acute sensory neuropathy 06/26/2015   Allergy    Anemia    Asthma    illness induced asthma   Blood transfusion without reported diagnosis    at age 19 years old D/T surgery to femur being crushed   Complication of anesthesia    hx. of allergic to ether (had surgery at 5 years and had reaction to the ether   Heart murmur    pt, states had a "working heart murmur"   History of Ramsey stones    Neutropenia, drug-induced ([place]) 03/10/2018   PONV (postoperative nausea and vomiting)    Skin cancer     PAST SURGICAL HISTORY: Past Surgical History:  Procedure Laterality Date   3 laporscopic proceedures     ABDOMINAL HYSTERECTOMY     2010   CHOLECYSTECTOMY     2010   DILATION AND CURETTAGE OF UTERUS     1997   IR IMAGING GUIDED PORT INSERTION  03/08/2018   LAPAROSCOPY N/A 03/27/2015   Procedure: DIAGNOSTIC LAPAROSCOPY ;  Surgeon: [name], MD;  Location: [facility];  Service: Gynecology;  Laterality: N/A;   LAPAROSCOPY N/A 02/24/2016   Procedure: LAPAROSCOPY DIAGNOSTIC WITH PERITONEAL WASHINGS AND PERITONEAL BIOPSY;  Surgeon: [name], MD;  Location: [facility];  Service: Gynecology;  Laterality: N/A;   LAPAROTOMY N/A 03/27/2015   Procedure: EXPLORATORY LAPAROTOMY, BILATERAL SALPINGO OOPHORECTOMY, OMENTECTOMY, RADICAL TUMOR DEBULKING;  Surgeon: [name], MD;  Location: [facility];  Service: Gynecology;  Laterality: N/A;   sinus surgery 1994      FAMILY HISTORY Family History  Problem Relation Age of Onset   Hypertension Father    Skin cancer Father        nonmelanoma skin cancers in his late 83s   Non-Hodgkin's lymphoma Maternal Aunt        dx. 13s; smoker   Stroke Maternal Grandmother    Other Mother        benign meningioma dx. early-mid-70s; hysterectomy in her late 79s for heavy periods - still has ovaries   Other Son        one son with pre-cancerous skin findings   Other Daughter        cysts on ovaries and hx of heavy periods   Other Sister         hysterectomy for cysts - still has ovaries   Heart attack Maternal Grandfather    Heart attack Paternal Grandfather    Renal cancer Maternal Aunt 60       smoker   Breast cancer Maternal Aunt 78   Other Maternal Aunt        dx. benign brain tumor (meningioma) at 50; 2nd benign brain tumor in her late 48s; hx of radical hysterectomy at age 88   Brain cancer Other        NOS tumor  The patient's parents are both living, in their late 37s as of January 2018. The patient has one brother, 3 sisters. There is no history of ovarian cancer in the family. One maternal aunt had breast and Ramsey cancer, another had non-Hodgkin's lymphoma.   GYNECOLOGIC HISTORY:  No LMP recorded. Patient has had a hysterectomy. 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 Her husband [name] is a Engineer, maintenance (IT). Their children are 19, 24, 45 and 59 y/o as of JAN 2018. The oldest works as an Electrical engineer in the [employer] in [place]. The other 3 children live in [place], the youngest currently attending [employer]. The patient has no grandchildren. She attends [employer]    ADVANCED DIRECTIVES: Zeniah has completed advanced directives and they are filed.  Her husband [name] is her healthcare power of attorney.   HEALTH MAINTENANCE: Social History   Tobacco Use   Smoking status: Never Smoker   Smokeless tobacco: Never Used  Scientific laboratory technician Use: Never used  Substance Use Topics   Alcohol use: Yes    Comment:  3 glasses wine or beer/week   Drug use: No    Colonoscopy:2016  PAP: s/p hyst  Bone density:   Allergies  Allergen Reactions   Bee Venom Shortness Of Breath    swelling   Prochlorperazine Edisylate Anaphylaxis, Anxiety and Palpitations    Anaphylaxis, Anxiety and Palpitations    Sulfa Antibiotics Shortness Of Breath    Vomit , diarrhea , hives   Phenergan [Promethazine Hcl]  Other (See Comments)    "funny sensation in my throat"   Zarxio [Filgrastim]     Current Outpatient Medications  Medication Sig Dispense Refill   acetaminophen (TYLENOL) 500 MG tablet Take 2 tablets by mouth 3 (three) times daily as needed.     albuterol (VENTOLIN HFA) 108 (90 Base) MCG/ACT inhaler Inhale into the lungs.     dexamethasone (DECADRON) 4 MG tablet Take 2 tablets by mouth once a day for 3 days after chemo. Take with food. 30 tablet 1   dicyclomine (BENTYL) 10 MG capsule TAKE 1 CAPSULE 4 TIMES DAILY BEFORE MEALS AND AT BEDTIME. 120 capsule 0   diphenhydrAMINE (BENADRYL) 50 MG capsule 50 mg Diphenhydramine (Benadryl) PO within 1 hour of the injection 1 capsule 0   fluticasone (FLONASE) 50 MCG/ACT nasal spray Place 1 spray into both nostrils 2 (two) times daily. (Patient taking differently: Place 1 spray into both nostrils as needed. ) 16 g 2   gabapentin (NEURONTIN) 100 MG capsule 200 mg bid with 300 mg qhs 630 capsule 1   ibuprofen (ADVIL) 600 MG tablet Take 1 tablet by mouth 4 (four) times daily as needed.     lidocaine-prilocaine (EMLA) cream APPLY TOPICALLY AS NEEDED. 30 g 0   loratadine (CLARITIN) 10 MG tablet Take 10 mg by mouth as needed.      LORazepam (ATIVAN) 0.5 MG tablet Take 1 tablet (0.5 mg total) by mouth at bedtime as needed (Nausea or vomiting). 20 tablet 0   Magnesium (V-R MAGNESIUM) 250 MG TABS Take by mouth.     magnesium oxide (MAG-OX) 400 (241.3 Mg) MG tablet Take 1 tablet (400 mg total) by mouth daily. 60 tablet 3   metoCLOPramide (REGLAN) 5 MG tablet Take 2 tablets (10 mg total) by mouth every 8 (eight) hours as needed for nausea. 30 tablet 2   Multiple Vitamins-Minerals (MULTIVITAMIN ADULT PO) Take 1 tablet by mouth daily.     Omega 3 1000 MG CAPS Take 1 capsule by mouth daily.     omeprazole (PRILOSEC) 40 MG capsule Take 1 capsule (40 mg total) by mouth daily. 30 capsule 5   prochlorperazine (COMPAZINE) 5 MG tablet Take 1 tablet (5 mg  total) by mouth every 8 (eight) hours as needed for nausea or vomiting. [place]  tablet 6   promethazine (PHENERGAN) 25 MG suppository Place 1 suppository (25 mg total) rectally every 6 (six) hours as needed for nausea or vomiting. 12 each 0   scopolamine (TRANSDERM-SCOP) 1 MG/3DAYS APPLY 1 PATCH EVERY 3 DAYS AS NEEDED FOR NAUSEA. 4 patch 0   scopolamine (TRANSDERM-SCOP) 1 MG/3DAYS Place 1 patch (1.5 mg total) onto the skin every 3 (three) days. 10 patch 12   senna-docusate (SENOKOT-S) 8.6-50 MG tablet Take 1-2 tablets by mouth 2 (two) times daily as needed.     sertraline (ZOLOFT) 50 MG tablet TAKE 1 & 1/2 TABLETS ONCE DAILY. 45 tablet 0   traMADol (ULTRAM) 50 MG tablet TAKE ONE TABLET EVERY 6 HOURS AS NEEDED. 60 tablet 0   valACYclovir (VALTREX) 1000 MG tablet Take 1 tablet (1,000 mg total) by mouth daily. 5 tablet 0   VENTOLIN HFA 108 (90 Base) MCG/ACT inhaler      vitamin B-12 (CYANOCOBALAMIN) 1000 MCG tablet Take 1 tablet by mouth daily.     No current facility-administered medications for this visit.   Facility-Administered Medications Ordered in Other Visits  Medication Dose Route Frequency Provider Last Rate Last Admin   sodium chloride flush (NS) 0.9 % injection 10 mL  10 mL Intravenous PRN Magrinat, Virgie Dad, MD        OBJECTIVE: White woman in no acute distress Vitals:   08/02/19 1006  BP: 93/69  Pulse: 76  Resp: 18  Temp: 98.5 F (36.9 C)  SpO2: 98%     Body mass index is 16.1 kg/m.   Filed Weights   08/02/19 1006  Weight: 90 lb 14.4 oz (41.2 kg)     ECOG FS:2 - Symptomatic, <50% confined to bed  GENERAL: Patient is a thin woman in no acute distress HEENT:  Sclerae anicteric.  Mask in place. Neck is supple.  NODES:  No cervical, supraclavicular, or axillary lymphadenopathy palpated.  BREAST EXAM:  Deferred. LUNGS:  Clear to auscultation bilaterally.  No wheezes or rhonchi. HEART:  Regular rate and rhythm. No murmur appreciated. ABDOMEN:  Soft, nontender.   Positive, normoactive bowel sounds. No organomegaly palpated. MSK:  No focal spinal tenderness to palpation. Full range of motion bilaterally in the upper extremities. EXTREMITIES:  No peripheral edema.   SKIN:  Clear with no obvious rashes or skin changes. No nail dyscrasia. NEURO:  Nonfocal. Well oriented.  Appropriate affect.   LAB RESULTS:  CMP     Component Value Date/Time   NA 137 08/02/2019 0956   NA 140 02/11/2017 0755   K 3.5 08/02/2019 0956   K 4.4 02/11/2017 0755   CL 99 08/02/2019 0956   CO2 26 08/02/2019 0956   CO2 26 02/11/2017 0755   GLUCOSE 100 (H) 08/02/2019 0956   GLUCOSE 93 02/11/2017 0755   BUN 15 08/02/2019 0956   BUN 7.9 02/11/2017 0755   CREATININE 0.81 08/02/2019 0956   CREATININE 0.78 06/19/2019 1352   CREATININE 0.7 02/11/2017 0755   CALCIUM 8.8 (L) 08/02/2019 0956   CALCIUM 9.6 02/11/2017 0755   PROT 7.2 08/02/2019 0956   PROT 7.5 02/11/2017 0755   ALBUMIN 3.3 (L) 08/02/2019 0956   ALBUMIN 4.4 02/11/2017 0755   AST 9 (L) 08/02/2019 0956   AST 13 (L) 06/19/2019 1352   AST 27 02/11/2017 0755   ALT 7 08/02/2019 0956   ALT 10 06/19/2019 1352   ALT 24 02/11/2017 0755   ALKPHOS 98 08/02/2019 0956   ALKPHOS 58 02/11/2017 0755   BILITOT 0.3 08/02/2019  0956   BILITOT 0.5 06/19/2019 1352   BILITOT 0.43 02/11/2017 0755   GFRNONAA >60 08/02/2019 0956   GFRNONAA >60 06/19/2019 1352   GFRAA >60 08/02/2019 0956   GFRAA >60 06/19/2019 1352    INo results found for: SPEP, UPEP  Lab Results  Component Value Date   WBC 4.6 08/02/2019   NEUTROABS 3.0 08/02/2019   HGB 7.5 (L) 08/02/2019   HCT 22.8 (L) 08/02/2019   MCV 95.0 08/02/2019   PLT 251 08/02/2019      Chemistry      Component Value Date/Time   NA 137 08/02/2019 0956   NA 140 02/11/2017 0755   K 3.5 08/02/2019 0956   K 4.4 02/11/2017 0755   CL 99 08/02/2019 0956   CO2 26 08/02/2019 0956   CO2 26 02/11/2017 0755   BUN 15 08/02/2019 0956   BUN 7.9 02/11/2017 0755   CREATININE 0.81  08/02/2019 0956   CREATININE 0.78 06/19/2019 1352   CREATININE 0.7 02/11/2017 0755      Component Value Date/Time   CALCIUM 8.8 (L) 08/02/2019 0956   CALCIUM 9.6 02/11/2017 0755   ALKPHOS 98 08/02/2019 0956   ALKPHOS 58 02/11/2017 0755   AST 9 (L) 08/02/2019 0956   AST 13 (L) 06/19/2019 1352   AST 27 02/11/2017 0755   ALT 7 08/02/2019 0956   ALT 10 06/19/2019 1352   ALT 24 02/11/2017 0755   BILITOT 0.3 08/02/2019 0956   BILITOT 0.5 06/19/2019 1352   BILITOT 0.43 02/11/2017 0755      No results found for: LABCA2  No components found for: LABCA125  No results for input(s): INR in the last 168 hours.  Urinalysis    Component Value Date/Time   COLORURINE STRAW (A) 11/22/2017 0940   APPEARANCEUR CLEAR 11/22/2017 0940   LABSPEC 1.002 (L) 11/22/2017 0940   LABSPEC 1.005 10/09/2015 1239   PHURINE 7.0 11/22/2017 0940   GLUCOSEU NEGATIVE 11/22/2017 0940   GLUCOSEU Negative 10/09/2015 1239   HGBUR NEGATIVE 11/22/2017 0940   BILIRUBINUR NEGATIVE 11/22/2017 0940   BILIRUBINUR Negative 10/09/2015 1239   KETONESUR NEGATIVE 11/22/2017 0940   PROTEINUR NEGATIVE 04/03/2019 1050   UROBILINOGEN 0.2 10/09/2015 1239   NITRITE NEGATIVE 11/22/2017 0940   LEUKOCYTESUR NEGATIVE 11/22/2017 0940   LEUKOCYTESUR Negative 10/09/2015 1239    STUDIES: CT Abdomen Pelvis W Contrast  Result Date: 07/30/2019 CLINICAL DATA:  Ovarian cancer. EXAM: CT ABDOMEN AND PELVIS WITH CONTRAST TECHNIQUE: Multidetector CT imaging of the abdomen and pelvis was performed using the standard protocol following bolus administration of intravenous contrast. CONTRAST:  151m OMNIPAQUE IOHEXOL 300 MG/ML  SOLN COMPARISON:  04/23/2019 FINDINGS: Lower chest: Interval progression of left pleural effusion, incompletely visualized. Associated left lower lobe collapse. Hepatobiliary: 11 mm posterior right hepatic lesion shows peripheral nodular enhancement compatible with hemangioma. Gallbladder surgically absent. No intrahepatic  or extrahepatic biliary dilation. Pancreas: No focal mass lesion. No dilatation of the main duct. No intraparenchymal cyst. No peripancreatic edema. Spleen: No splenomegaly. No focal mass lesion. Adrenals/Urinary Tract: No adrenal nodule or mass. Kidneys unremarkable. No evidence for hydroureter. Diffuse bladder wall thickening and irregularity noted. Stomach/Bowel: Stomach is unremarkable. No gastric wall thickening. No evidence of outlet obstruction. Duodenum is distended in the descending segment. Diffuse mild small bowel wall thickening evident, likely secondary. No gross colonic mass. No colonic wall thickening. Vascular/Lymphatic: No abdominal aortic aneurysm. There is no gastrohepatic or hepatoduodenal ligament lymphadenopathy. No retroperitoneal or mesenteric lymphadenopathy. No pelvic sidewall lymphadenopathy. Reproductive: Surgically absent.  There  is no adnexal mass. Other: Calcified cul-de-sac nodule measures 1.7 x 1.6 cm today compared to 1.7 x 1.3 cm previously. Similar appearance of intraperitoneal fluid with diffuse surrounding peritoneal enhancement. Fluid displaces small bowel centrally in the abdomen and generates mass-effect on the extraperitoneal and retroperitoneal anatomy, as before. Musculoskeletal: No worrisome lytic or sclerotic osseous abnormality. IMPRESSION: 1. Interval progression of left pleural effusion, incompletely visualized. Associated left lower lobe collapse. 2. Similar appearance of intraperitoneal fluid with diffuse surrounding peritoneal enhancement consistent with peritoneal carcinomatosis. Calcified metastatic nodule in the cul-de-sac is similar to prior. 3. Dilatation of the duodenum again noted, as characterized by upper GI study of 05/29/2019. Diffuse mild small bowel wall thickening, likely secondary. 4. Diffuse bladder wall thickening and irregularity,. 5. Aortic Atherosclerosis (ICD10-I70.0). Electronically Signed   By: Misty Stanley M.D.   On: 07/30/2019 12:16      ELIGIBLE FOR AVAILABLE RESEARCH PROTOCOL:  no  ASSESSMENT: 54 y.o. BRCA negative [place] woman status post radical tumor debulking with optimal cytoreduction (R0) 03/27/2015 for a stage IIIC, high-grade right fallopian tube carcinoma  (a) baseline CA-125 was 1226.  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)  (c) tumor is PD-L1 negative (02/24/2016)  (d) tumor is strongly estrogen receptor positive, progesterone receptor negative (02/24/2016)  (1) adjuvant chemotherapy consisted of carboplatin and paclitaxel for 6 doses, begun 04/14/2015, completed 08/11/2015  (a) paclitaxel was omitted from cycle 5 and dose reduced on cycle 6 because of neuropathy   (b) a "make up" dose of paclitaxel was given 08/25/2015  (c) last carboplatin dose was 08/11/2015  (d) CA-125 normalized by 06/16/2015   (2) FIRST RECURRENCE: January 2018  (a) CA-125 rise beginning November 2017 led to CT scans and PET scans December 2017, all negative  (b) exploratory laparotomy 02/24/2016 showed pathologically confirmed recurrence, with miliary disease involving all examined surfaces  (c) port-site metastases noted 03/08/2016  (d) the January 2018 tumor sample was tested for the estrogen receptor and he was 80 percent positive with strong staining, progesterone receptor negative  (3) carboplatin/liposomal doxorubicin started 03/05/2016, repeated every three weeks x4, completed 06/04/2016.  (a) cycle 3 delayed one week because of neutropenia; OnPro added  (b) CA 125 normalized after cycle 3  (4) RUCAPARIB maintenance started 07/02/2016  (a) restaging 10/01/2016: Normal CA 125, negative CT of the abdomen and pelvis  (b) labs 11/24/2016 shows a rise in her CA 125-43.4, with continuing rise thereafter  (c) rucaparib discontinued October 2018 with  rising CA 125  (5) anastrozole started December 23, 2016-stopped after a couple of weeks with poor tolerance  SECOND RECURRENCE: (6) Gemcitabine/Bevacizumab started on 02/04/2017  (a) gemcitabine omitted cycle 4 because of intercurrent infection  (b) gemcitabine resumed with cycle 5, with intervening rise in  Ca 125  (c) repeat CT scan of the abdomen and pelvis on 05/26/2017 does not confirm obvious disease progression  (d) cycle 5 of gemcitabine/bevacizumab delayed 1 week   (e) gemcitabine/ bevacizumab discontinued after 6 cycles, with rising CA 125 (last dose 06/24/2017)  (7) foundation one testing did not show a high mutation burden and the microsatellite status was stable.  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  These were not immediately actionable  (8) Abraxane started 07/12/2017, given weekly or weeks 1 and 2 of each 3 weeks cycle, last dose 11/15/2017 (5 cycles)  (a) cycle 2  started 08/09/2017, will be day 1 day 8 only  (b) cycle 3 scheduled to start 09/13/2017, day 1 day 8 at patient request  (c) CT of the abdomen and pelvis 11/28/2017 shows no measurable disease  (d) PET scan 01/25/2018 and MRI of the pelvis 02/03/2018 show no measurable disease  (e) patient has symptomatic ascites and a rapidly rising Ca1 25  (9) started cisplatin/gemcitabine days 1 and 8 of each 21-day cycle on 02/18/2018  (a) switched to carboplatin/ gemcitabine 07/17/2018 due to neuropathy  (b) carbo/gemzar changed to Q14 days 07/24/2018 due to neutropenia  (c) considered niraparib/Zejula 100 mg, 2 tabs daily at the completion of chemotherapy  (10) associated problems  (a) anemia due to chemotherapy: On Aranesp  (b) 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 10/25/18 Consenting for AMV-564 clinical trial 21. 11/13/18 AMV-564 initiation 75 mcg dose 22. 10/8  Dropped down to 50 mcg dose (I think that date is correct) 23. 10/19-21/20 Tx held due to Neutropenia 24. 12/06/18 Given Neulasta 25. 12/07/18 AMV-564 resumed 26. 01/03/19 CT C/A/P 10% RECIST progression 27. 01/15/19 Increased dose to 75 mcg/daily 28. 01/17/19 Thoracentesis with cytology c/w [place] primary  29. Paracentesis 02/05/2019: cytology c/w adenocarcinoma  (12) schedule of thoracenteses:  (a) left 01/17/2019 [facility])  (b) left 02/06/2019 [facility])  (c) 05/21/2019 on the left [facility])  (13) schedule of paracenteses:  (a) 01/30/2019 ([place])  (b) 02/06/2019 ([place])  (c) 02/18/2018 ([place])  (d) 04/17/2019 ([place])  (e) additional procedures 04/30/2019, 05/24/2019  (14) abraxane started 02/20/2019, repeated days 1 and 8 of every 21 day cycle  (a) bevacizumab/Avastin started 02/20/2019 and repeated every 21 days  (b) She cannot tolerate Zarxio due to side effects consisting of low grade fever, body aches, malaise, fatigue, and itching, whereas she has been able to tolerate Granix though  (c) bevacizumab and Abraxane discontinued after 04/10/2019 dose with evidence of progression  (15) exemestane started 04/24/2019, discontinued after 2 weeks as patient opted for chemotherapy  (16) started liposomal doxorubicin/carboplatin 05/10/2019, repeated every 28 days  (a) echocardiogram 05/08/2017 shows an ejection fraction in the 55-60% range.  (b) patient is interested in [id number] if available through the "right to try" program  (c) HER-2 determination requested on prior pathology 06/04/2019--negative results  (d) considering [facility] , [facility] or [place] study   PLAN: Kathryn Ramsey continues on therapy with liposomal Doxorubicin and Carboplatin given on day 1 of every 28 days.  Her plt count has increased from 31 to 251.  She is slightly more anemic today with a hemoglobin of 7.5 and she will need one unit of blood.  We reviewed her chemotherapy orders, and Dr. Jana Hakim instituted another dose reduction in her  carboplatin to achieve a 25% dose reduction to decrease the amount of thrombocytopenia she is experiencing after treatments.    Kathryn Ramsey met with myself and Dr. Jana Hakim to review her scans and the potential enrollment in her clinical trial.  We talked to her about PO intake and she was encouraged to continue eating calorie dense foods.    Dr. Jana Hakim will look at the clinical trial information when he receives it.  We will see Kathryn Ramsey back in 4 weeks for labs, f/u, and her next treatment.  She knows to call for any other issue that may develop before the next visit  Total encounter time 30 minutes.Wilber Bihari, NP 08/05/19 3:11 PM Medical Oncology  and Hematology [facility] [place], [place] [address] Tel. [phone]    Fax. [phone]   ADDENDUM: Kathryn Ramsey is having trouble maintaining her weight.  She is having no trouble maintaining her very positive attitude which is exemplary.  We are cutting back on not only the carbo dose but also the Doxil dose today.  In my opinion I do not think she will be able to tolerate any more of these chemotherapy.  She is aware of this.    She is interested in a study at [facility] which initially will use CDK 7 inhibitors and later combined those with fulvestrant.  If she does not participate in that study then fulvestrant alone given here may be an option for her.  She already has a return appointment with [name] July 13.  She knows to call for any other issues that may develop before then.  I personally saw this patient and performed a substantive portion of this encounter with the listed APP documented above.   Chauncey Cruel, MD Medical Oncology and Hematology [facility] [address] [place], [place] [address] Tel. [phone]    Fax. [phone]     *Total Encounter Time as defined by the Centers for Medicare and Medicaid Services includes, in addition to the face-to-face time of a patient visit  (documented in the note above) non-face-to-face time: obtaining and reviewing outside history, ordering and reviewing medications, tests or procedures, care coordination (communications with other health care professionals or caregivers) and documentation in the medical record.

## 2019-08-02 NOTE — Progress Notes (Signed)
Per Dr. Jana Hakim, ok to add Retacrit 40,000 units x 1 dose today given chemo-induced anemia (Hgb = 7.5 g/dL).  Pt has been auth'd for Retacrit.  Dr. Jana Hakim ok'd dose adj of Doxil using today's weight, dose today will be 40 mg.  Dr. Jana Hakim indicated in his last OV note that he planned to decr Carbo dose by 20%.  On 07/02/19, pt got Carbo 340 mg.  Today's Carbo dose is 270 mg (AUC ~ 3.6) which is a 20% reduction.  Kennith Center, Pharm.D., CPP 08/02/2019@12 :06 PM

## 2019-08-03 ENCOUNTER — Telehealth: Payer: Self-pay | Admitting: Oncology

## 2019-08-03 LAB — TYPE AND SCREEN
ABO/RH(D): A POS
Antibody Screen: NEGATIVE
Unit division: 0

## 2019-08-03 LAB — BPAM RBC
Blood Product Expiration Date: 202107042359
ISSUE DATE / TIME: 202106171512
Unit Type and Rh: 6200

## 2019-08-03 NOTE — Telephone Encounter (Signed)
Scheduled appt per 6/17 los. Pt confirmed appt date and time.

## 2019-08-04 ENCOUNTER — Encounter: Payer: Self-pay | Admitting: Adult Health

## 2019-08-06 ENCOUNTER — Other Ambulatory Visit: Payer: Self-pay | Admitting: Oncology

## 2019-08-08 ENCOUNTER — Other Ambulatory Visit: Payer: Self-pay

## 2019-08-08 ENCOUNTER — Ambulatory Visit: Payer: 59 | Admitting: Physical Therapy

## 2019-08-08 ENCOUNTER — Encounter: Payer: Self-pay | Admitting: Physical Therapy

## 2019-08-08 DIAGNOSIS — M62838 Other muscle spasm: Secondary | ICD-10-CM

## 2019-08-08 DIAGNOSIS — R109 Unspecified abdominal pain: Secondary | ICD-10-CM

## 2019-08-08 DIAGNOSIS — C762 Malignant neoplasm of abdomen: Secondary | ICD-10-CM

## 2019-08-08 NOTE — Therapy (Signed)
[facility] [address], [place], [place], [address] Phone: [phone]   Fax:  [phone]  Physical Therapy Treatment  Patient Details  Name: Kathryn [facility] MRN: [id number] Date of Birth: 04-05-1965 Referring Provider (PT): Dr. Lurline Del   Encounter Date: 08/08/2019   PT End of Session - 08/08/19 1326    Visit Number 8    Date for PT Re-Evaluation 10/31/19    Authorization Type UHC    Authorization - Visit Number 8    Authorization - Number of Visits 23    PT Start Time 1230    PT Stop Time 1308    PT Time Calculation (min) 38 min    Activity Tolerance Patient tolerated treatment well    Behavior During Therapy [facility] for tasks assessed/performed           Past Medical History:  Diagnosis Date  . Acute sensory neuropathy 06/26/2015  . Allergy   . Anemia   . Asthma    illness induced asthma  . Blood transfusion without reported diagnosis    at age 1 years old D/T surgery to femur being crushed  . 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 Heart murmur    pt, states had a "working heart murmur"  . History of kidney stones   . Neutropenia, drug-induced ([place]) 03/10/2018  . PONV (postoperative nausea and vomiting)   . Skin cancer     Past Surgical History:  Procedure Laterality Date  . 3 laporscopic proceedures    . ABDOMINAL HYSTERECTOMY     2010  . CHOLECYSTECTOMY     2010  . DILATION AND CURETTAGE OF UTERUS     1997  . IR IMAGING GUIDED PORT INSERTION  03/08/2018  . LAPAROSCOPY N/A 03/27/2015   Procedure: DIAGNOSTIC LAPAROSCOPY ;  Surgeon: [name], MD;  Location: [facility];  Service: Gynecology;  Laterality: N/A;  . LAPAROSCOPY N/A 02/24/2016   Procedure: LAPAROSCOPY DIAGNOSTIC WITH PERITONEAL WASHINGS AND PERITONEAL BIOPSY;  Surgeon: [name], MD;  Location: [facility];  Service: Gynecology;  Laterality: N/A;  . LAPAROTOMY N/A  03/27/2015   Procedure: EXPLORATORY LAPAROTOMY, BILATERAL SALPINGO OOPHORECTOMY, OMENTECTOMY, RADICAL TUMOR DEBULKING;  Surgeon: [name], MD;  Location: [facility];  Service: Gynecology;  Laterality: N/A;  . sinus surgery 1994      There were no vitals filed for this visit.   Subjective Assessment - 08/08/19 1236    Subjective They took alot of fluid out of the lung. Patient feels fatique. After the procedure I had Vania Rea. I have lost weigth. I have not had a bowel movement in several days.    Patient Stated Goals improve mobility of the intestines    Currently in Pain? 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 Lurline Del    Prior Therapy yes      Precautions   Precautions Other (comment)    Precaution Comments current malignancy      Restrictions   Weight Bearing Restrictions No      Prior Function   Level of Independence Independent      Cognition   Overall Cognitive Status Within Functional Limits for tasks assessed      Observation/Other Assessments-Edema    Edema Circumferential      Circumferential Edema   Circumferential - Right prior to treamtent at umbilicus is 73 cm and after 73 cm                         [facility] Adult PT Treatment/Exercise - 08/08/19 0001      Manual Therapy   Manual Therapy Myofascial release;Soft tissue mobilization    Manual therapy comments moist heat while  manual work is done    Soft tissue mobilization gentle circular motion around the intestines to promote peristalic motion of the abdomen    Myofascial Release fascial release of the urogenital diaphgram and respiratory diaphragm, release of the scar along the lower abdomen, lifting up the intestines from the bladder, release around the umbilicus                  PT Education - 08/08/19 1405    Education Details educated pateint on how her husband can do circular motion around the abdomen to reduce discomfort and improve bowel movements    Person(s) Educated Patient    Methods Explanation;Demonstration    Comprehension Verbalized understanding;Returned demonstration            PT Short Term Goals - 08/08/19 1409      PT SHORT TERM GOAL #1   Title able to reports pain level decreased by 1 level after treatment due to improved visceral mobility    Time 4    Period Weeks    Status Achieved      PT SHORT TERM GOAL #2   Title understand correct toileting technique to assist stool to come out with relaxation of the pelvic floor    Time 4    Period Weeks    Status Achieved             PT Long Term Goals - 08/08/19 1409      PT LONG TERM GOAL #1   Title understand how to perfrom lower abdominal fascial work to reduce the intestines from sticking together due to her cancer    Baseline showed her how to perform abdominal work so she is able to show her husband    Time 12    Period Weeks    Status Achieved      PT LONG TERM GOAL #2   Title pain level after treatment decreased by 2 pain levels due to improved visceral mobility    Time 12    Period Weeks    Status On-going      PT LONG TERM GOAL #3   Title abdominal circumference reduced >/= 1 cm after treatment due to improved visceral mobility    Time 12    Period Weeks    Status Achieved      PT LONG TERM GOAL #4   Title able to keep her abdominal circumference 73cm or less due to keeping the swelling down  from  her treatments    Time 12    Period Weeks    Target Date 10/31/19      PT LONG TERM GOAL #5   Title able to have 2-3 bowel movements per week due to improved peristalic motion of the intestines    Time 12    Period Weeks    Status New    Target Date 10/31/19                 Plan - 08/08/19 1414    Clinical Impression Statement Patient had fluid taken off her left lung yesterday and she is able to breathe better. Patient continues to have low energy. Patient has tighthness in the abdomen inferiorly. Patient has not had a bowel movement in several days. Patient abdominal circumference at 73 cm for several weeks now. She has less discomfort in the abdomen with having manual work done weekly. Patient is going to teach her husband on how to perform circular massage to the abdomen to improve mobility and reduce her discomfort. Patient will benefit from skilled therapy to improve abdominal, visceral, and intestinal mobility to prevent constipation and reduce discomfort.    Personal Factors and Comorbidities Comorbidity 3+;Age    Comorbidities abdominal carcinomatosis; cancer of right fallopian tube; carcinomatosis peritonei; currently on chemotherapy; abdominal hysterectomy    Examination-Activity Limitations Sleep;Bend;Locomotion Level    Examination-Participation Restrictions Community Activity;Interpersonal Relationship    Stability/Clinical Decision Making Evolving/Moderate complexity    Rehab Potential Good    PT Frequency 1x / week    PT Duration 12 weeks    PT Treatment/Interventions Moist Heat;Therapeutic activities;Therapeutic exercise;Neuromuscular re-education;Manual techniques;Patient/family education    PT Next Visit Plan visceral mobilization; mobilization of the diaphragm; rib cage mobilization; measure abdominal circumference, see if husband has been perfroming manual work    Oncologist with Plan of Care Patient           Patient will benefit from skilled  therapeutic intervention in order to improve the following deficits and impairments:  Pain, Increased fascial restricitons, Decreased activity tolerance  Visit Diagnosis: Abdominal pain, unspecified abdominal location - Plan: PT plan of care cert/re-cert  Muscle spasm - Plan: PT plan of care cert/re-cert  Abdominal carcinomatosis ([place]) - Plan: PT plan of care cert/re-cert     Problem List Patient Active Problem List   Diagnosis Date Noted  . Dysphagia 05/25/2019  . Neuropathy due to chemotherapeutic drug ([place]) 07/24/2018  . Anemia due to antineoplastic chemotherapy 05/22/2018  . Port-A-Cath in place 05/01/2018  . Neutropenia, drug-induced ([place]) 03/10/2018  . Diarrhea 08/02/2017  . Lichen sclerosus et atrophicus of the vulva 03/31/2016  . Abdominal wall mass of right lower quadrant 03/08/2016  . 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 Goals of care, counseling/discussion 02/27/2016  . Elevated CA-125 02/24/2016  . Vaginal dryness, menopausal 10/01/2015  . Drug-induced peripheral neuropathy ([place]) 09/02/2015  . Leukopenia due to antineoplastic chemotherapy ([place]) 09/02/2015  . Chemotherapy-induced neuropathy ([place]) 08/19/2015  . Cellulitis 07/03/2015  . Acute sensory neuropathy 06/26/2015  . High risk medication use 06/21/2015  . Facial paresthesia 06/18/2015  . Genetic testing 06/16/2015  . Restless legs 05/14/2015  . Family history of breast cancer in female 04/29/2015  . Chemotherapy-induced nausea 04/22/2015  . Chemotherapy induced neutropenia ([place]) 04/22/2015  . 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 History of hysterectomy for benign disease 03/21/2015  . IBS (irritable bowel syndrome) 03/21/2015  . Dense breast tissue 03/21/2015  . History of cholecystectomy  03/21/2015  . Abdominal carcinomatosis ([place]) 03/21/2015    Earlie Counts, PT 08/08/19 2:21 PM   [facility] [address], [place], [place], [address] Phone: [phone]   Fax:  [phone]  Name: Kathryn [facility] MRN: [id number] Date of Birth: 14-May-1965

## 2019-08-13 ENCOUNTER — Ambulatory Visit: Payer: 59

## 2019-08-15 ENCOUNTER — Ambulatory Visit: Payer: 59 | Admitting: Physical Therapy

## 2019-08-16 ENCOUNTER — Ambulatory Visit: Payer: 59 | Attending: Gynecologic Oncology | Admitting: Physical Therapy

## 2019-08-16 ENCOUNTER — Other Ambulatory Visit: Payer: Self-pay

## 2019-08-16 DIAGNOSIS — R109 Unspecified abdominal pain: Secondary | ICD-10-CM | POA: Diagnosis not present

## 2019-08-16 DIAGNOSIS — C762 Malignant neoplasm of abdomen: Secondary | ICD-10-CM | POA: Insufficient documentation

## 2019-08-16 DIAGNOSIS — M62838 Other muscle spasm: Secondary | ICD-10-CM | POA: Insufficient documentation

## 2019-08-16 NOTE — Therapy (Signed)
[facility] [address] [place], [place], [address] Phone: [phone]   Fax:  [phone]  Physical Therapy Treatment  Patient Details  Name: Kathryn [facility] MRN: [id number] Date of Birth: 20-Jul-1965 Referring Provider (PT): Dr. Lurline Del   Encounter Date: 08/16/2019   PT End of Session - 08/16/19 1010    Visit Number 9    Date for PT Re-Evaluation 10/31/19    Authorization Type UHC    Authorization - Visit Number 9    Authorization - Number of Visits 23    PT Start Time 0930    PT Stop Time 1010    PT Time Calculation (min) 40 min    Activity Tolerance Patient tolerated treatment well    Behavior During Therapy [facility] for tasks assessed/performed           Past Medical History:  Diagnosis Date  . Acute sensory neuropathy 06/26/2015  . Allergy   . Anemia   . Asthma    illness induced asthma  . Blood transfusion without reported diagnosis    at age 54 years old D/T surgery to femur being crushed  . 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 Heart murmur    pt, states had a "working heart murmur"  . History of kidney stones   . Neutropenia, drug-induced ([place]) 03/10/2018  . PONV (postoperative nausea and vomiting)   . Skin cancer     Past Surgical History:  Procedure Laterality Date  . 3 laporscopic proceedures    . ABDOMINAL HYSTERECTOMY     2010  . CHOLECYSTECTOMY     2010  . DILATION AND CURETTAGE OF UTERUS     1997  . IR IMAGING GUIDED PORT INSERTION  03/08/2018  . LAPAROSCOPY N/A 03/27/2015   Procedure: DIAGNOSTIC LAPAROSCOPY ;  Surgeon: [name], MD;  [facility];  Service: Gynecology;  Laterality: N/A;  . LAPAROSCOPY N/A 02/24/2016   Procedure: LAPAROSCOPY DIAGNOSTIC WITH PERITONEAL WASHINGS AND PERITONEAL BIOPSY;  Surgeon: [name], MD;  [facility];  Service: Gynecology;  Laterality: N/A;  . LAPAROTOMY N/A  03/27/2015   Procedure: EXPLORATORY LAPAROTOMY, BILATERAL SALPINGO OOPHORECTOMY, OMENTECTOMY, RADICAL TUMOR DEBULKING;  Surgeon: [name], MD;  [facility];  Service: Gynecology;  Laterality: N/A;  . sinus surgery 1994      There were no vitals filed for this visit.   Subjective Assessment - 08/16/19 0937    Subjective I have not gone to the bathroom since Monday. I feel like I have to go to the bathroom but not able to.    Patient Stated Goals improve mobility of the intestines    Currently in Pain? 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 Lurline Del    Prior Therapy yes      Precautions   Precautions Other (comment)    Precaution Comments current malignancy      Restrictions   Weight Bearing Restrictions No      Prior Function   Level of Independence Independent      Cognition   Overall Cognitive Status Within Functional Limits for tasks assessed      Observation/Other Assessments-Edema    Edema Circumferential      Circumferential Edema   Circumferential - Right prior to treamtent at umbilicus is 18.2 cm and after 73 cm      Posture/Postural Control   Posture/Postural Control Postural limitations    Postural Limitations Rounded Shoulders;Forward head      ROM / Strength   AROM / PROM / Strength AROM;PROM;Strength      AROM   Lumbar Extension decreased by 50%      Strength    Right Hip Flexion 4/5    Right Hip ADduction 4/5    Left Hip Flexion 4/5    Left Hip ADduction 4/5                         [facility] Adult PT Treatment/Exercise - 08/16/19 0001      Lumbar Exercises: Standing   Other Standing Lumbar Exercises standing lumbar extension 3x      Lumbar Exercises: Supine   Bridge 10 reps;1 second    Bridge Limitations 10 times with bridge sway    Other Supine Lumbar Exercises diaphragmatic breathing with expanding the rib cage and then the abdomen to relax the pelvic floor      Manual Therapy   Manual Therapy Soft tissue mobilization;Myofascial release    Soft tissue mobilization circular massage to the abdomen to improve peristalic motion and heard some bowel sounds    Myofascial Release fascial release of the sac of douglas and lifting the intestines off the bladder                  PT Education - 08/16/19 1008    Education Details Access Code: [email or web address]    Person(s) Educated Patient    Methods Explanation;Demonstration;Verbal cues;Handout    Comprehension Returned demonstration;Verbalized understanding            PT Short Term Goals - 08/08/19 1409      PT SHORT TERM GOAL #1   Title able to reports pain level decreased by 1 level after treatment due to improved visceral mobility    Time 4    Period Weeks    Status Achieved      PT SHORT TERM GOAL #2   Title understand correct toileting technique to assist stool to come out with relaxation of the pelvic floor    Time 4    Period Weeks    Status Achieved             PT Long Term Goals - 08/16/19 1014      PT LONG TERM GOAL #1   Title understand how to perfrom lower abdominal fascial work to reduce the intestines from sticking together due to her cancer    Baseline showed her how to perform abdominal work so she is able to show her husband    Period Weeks    Status Achieved      PT LONG TERM GOAL #2   Title pain level  after treatment decreased by 2 pain  levels due to improved visceral mobility    Time 12    Period Weeks    Status On-going      PT LONG TERM GOAL #3   Title abdominal circumference reduced >/= 1 cm after treatment due to improved visceral mobility    Baseline 0.5cm    Time 12    Period Weeks    Status Achieved      PT LONG TERM GOAL #4   Title able to keep her abdominal circumference 73cm or less due to keeping the swelling down from her treatments    Time 12    Period Weeks    Status Achieved      PT LONG TERM GOAL #5   Title able to have 2-3 bowel movements per week due to improved peristalic motion of the intestines    Time 12    Period Weeks    Status On-going      Additional Long Term Goals   Additional Long Term Goals Yes      PT LONG TERM GOAL #6   Title able to exercise for 15 minutes total to improve her fatique >/= 25%    Time 12    Period Weeks    Status New    Target Date 11/08/19                 Plan - 08/16/19 1011    Clinical Impression Statement Patient abdomen circumference has decreased to 72.5 cm due to losing weight and after abdominal massage. Patient has not had a bowel movement since Monday. After manual work there was good bowel sounds and softening of the abdomen. Patient has not been walking and exercise as much due to fatique and discomfort in the abdomen. Patient is not able to do the exercise supine but in a reclined position. Patient has learned how to start an exercise program in reclined. She has decreased lumbar extension and weakness in her hips. Patient will benefit from skilled therapy to improve abdomial visceral, and intestinal mobility to prevent constipaton and reduce discomfort.    Personal Factors and Comorbidities Comorbidity 3+;Age    Comorbidities abdominal carcinomatosis; cancer of right fallopian tube; carcinomatosis peritonei; currently on chemotherapy; abdominal hysterectomy    Examination-Activity Limitations Sleep;Bend;Locomotion Level     Examination-Participation Restrictions Community Activity;Interpersonal Relationship    Stability/Clinical Decision Making Evolving/Moderate complexity    Rehab Potential Good    PT Frequency 1x / week    PT Duration 12 weeks    PT Treatment/Interventions Moist Heat;Therapeutic activities;Therapeutic exercise;Neuromuscular re-education;Manual techniques;Patient/family education    PT Next Visit Plan visceral mobilization; mobilization of the diaphragm; rib cage mobilization; measure abdominal circumference, see if husband has been perfroming manual work    PT Home Exercise Plan Access Code: [email or web address]    Recommended Other Services sent MD renewal note on 08/16/2019    Consulted and Agree with Plan of Care Patient           Patient will benefit from skilled therapeutic intervention in order to improve the following deficits and impairments:  Pain, Increased fascial restricitons, Decreased activity tolerance  Visit Diagnosis: Abdominal pain, unspecified abdominal location - Plan: PT plan of care cert/re-cert  Muscle spasm - Plan: PT plan of care cert/re-cert  Abdominal carcinomatosis ([place]) - Plan: PT plan of care cert/re-cert     Problem List Patient Active Problem List   Diagnosis Date Noted  . Dysphagia 05/25/2019  . Neuropathy due to  chemotherapeutic drug ([place]) 07/24/2018  . Anemia due to antineoplastic chemotherapy 05/22/2018  . Port-A-Cath in place 05/01/2018  . Neutropenia, drug-induced ([place]) 03/10/2018  . Diarrhea 08/02/2017  . Lichen sclerosus et atrophicus of the vulva 03/31/2016  . Abdominal wall mass of right lower quadrant 03/08/2016  . 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 Goals of care, counseling/discussion 02/27/2016  . Elevated CA-125 02/24/2016  . Vaginal dryness, menopausal 10/01/2015  . Drug-induced peripheral neuropathy ([place]) 09/02/2015  . Leukopenia due to antineoplastic chemotherapy ([place]) 09/02/2015  . Chemotherapy-induced neuropathy ([place])  08/19/2015  . Cellulitis 07/03/2015  . Acute sensory neuropathy 06/26/2015  . High risk medication use 06/21/2015  . Facial paresthesia 06/18/2015  . Genetic testing 06/16/2015  . Restless legs 05/14/2015  . Family history of breast cancer in female 04/29/2015  . Chemotherapy-induced nausea 04/22/2015  . Chemotherapy induced neutropenia ([place]) 04/22/2015  . 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 History of hysterectomy for benign disease 03/21/2015  . IBS (irritable bowel syndrome) 03/21/2015  . Dense breast tissue 03/21/2015  . History of cholecystectomy 03/21/2015  . Abdominal carcinomatosis ([place]) 03/21/2015    Earlie Counts, PT 08/16/19 10:21 AM   [facility] [address] [place], [place], [address] Phone: [phone]   Fax:  [phone]  Name: Kathryn [facility] MRN: [id number] Date of Birth: 05-12-1965

## 2019-08-16 NOTE — Patient Instructions (Signed)
Access Code: [email or web address] URL: [email or web address] Date: 08/16/2019 Prepared by: Earlie Counts  Exercises Supine Diaphragmatic Breathing - 3 x daily - 7 x weekly - 1 sets - 10 reps Hooklying Isometric Hip Flexion - 1 x daily - 7 x weekly - 1 sets - 10 reps - 10 se hold Beginner Bridge - 1 x daily - 7 x weekly - 1 sets - 10 reps Standing Lumbar Extension - 3 x daily - 7 x weekly - 1 sets - 2-3 reps [facility] [address] [place], [place] [address] Phone # [phone] Fax [phone]

## 2019-08-17 ENCOUNTER — Other Ambulatory Visit: Payer: Self-pay | Admitting: Oncology

## 2019-08-17 ENCOUNTER — Other Ambulatory Visit: Payer: Self-pay | Admitting: *Deleted

## 2019-08-17 DIAGNOSIS — C762 Malignant neoplasm of abdomen: Secondary | ICD-10-CM

## 2019-08-17 DIAGNOSIS — C786 Secondary malignant neoplasm of retroperitoneum and peritoneum: Secondary | ICD-10-CM

## 2019-08-17 DIAGNOSIS — C5701 Malignant neoplasm of right fallopian tube: Secondary | ICD-10-CM

## 2019-08-17 DIAGNOSIS — Z7189 Other specified counseling: Secondary | ICD-10-CM

## 2019-08-22 ENCOUNTER — Ambulatory Visit: Payer: 59

## 2019-08-23 ENCOUNTER — Ambulatory Visit: Payer: 59 | Admitting: Physical Therapy

## 2019-08-23 ENCOUNTER — Encounter: Payer: Self-pay | Admitting: Physical Therapy

## 2019-08-23 ENCOUNTER — Other Ambulatory Visit: Payer: Self-pay

## 2019-08-23 DIAGNOSIS — R109 Unspecified abdominal pain: Secondary | ICD-10-CM

## 2019-08-23 DIAGNOSIS — M62838 Other muscle spasm: Secondary | ICD-10-CM

## 2019-08-23 DIAGNOSIS — C762 Malignant neoplasm of abdomen: Secondary | ICD-10-CM
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
## 2019-08-25 ENCOUNTER — Encounter: Payer: Self-pay | Admitting: Oncology

## 2019-08-26 ENCOUNTER — Encounter: Payer: Self-pay | Admitting: Oncology

## 2019-08-27 ENCOUNTER — Telehealth: Payer: Self-pay | Admitting: *Deleted

## 2019-08-27 ENCOUNTER — Ambulatory Visit (HOSPITAL_COMMUNITY)
Admission: RE | Admit: 2019-08-27 | Discharge: 2019-08-27 | Disposition: A | Discharge status: Home / Self Care | Payer: 59 | Source: Ambulatory Visit | Attending: Medical | Admitting: Medical

## 2019-08-27 ENCOUNTER — Other Ambulatory Visit: Payer: Self-pay | Admitting: Emergency Medicine

## 2019-08-27 ENCOUNTER — Inpatient Hospital Stay: Payer: 59

## 2019-08-27 ENCOUNTER — Inpatient Hospital Stay: Payer: 59 | Attending: Oncology | Admitting: Medical

## 2019-08-27 ENCOUNTER — Encounter: Payer: Self-pay | Admitting: Medical

## 2019-08-27 ENCOUNTER — Other Ambulatory Visit: Payer: Self-pay

## 2019-08-27 VITALS — BP 90/72 | HR 88 | Temp 97.7°F | Resp 16 | Wt 81.6 lb

## 2019-08-27 DIAGNOSIS — C801 Malignant (primary) neoplasm, unspecified: Secondary | ICD-10-CM

## 2019-08-27 DIAGNOSIS — R112 Nausea with vomiting, unspecified: Secondary | ICD-10-CM | POA: Insufficient documentation

## 2019-08-27 DIAGNOSIS — R1114 Bilious vomiting: Secondary | ICD-10-CM

## 2019-08-27 DIAGNOSIS — C5701 Malignant neoplasm of right fallopian tube: Secondary | ICD-10-CM | POA: Diagnosis not present

## 2019-08-27 DIAGNOSIS — E876 Hypokalemia: Secondary | ICD-10-CM

## 2019-08-27 DIAGNOSIS — C786 Secondary malignant neoplasm of retroperitoneum and peritoneum: Secondary | ICD-10-CM

## 2019-08-27 DIAGNOSIS — Z95828 Presence of other vascular implants and grafts: Secondary | ICD-10-CM

## 2019-08-27 LAB — CBC WITH DIFFERENTIAL (CANCER CENTER ONLY)
Abs Immature Granulocytes: 0.01 10*3/uL (ref 0.00–0.07)
Basophils Absolute: 0 10*3/uL (ref 0.0–0.1)
Basophils Relative: 0 %
Eosinophils Absolute: 0 10*3/uL (ref 0.0–0.5)
Eosinophils Relative: 0 %
HCT: 24.9 % — ABNORMAL LOW (ref 36.0–46.0)
Hemoglobin: 8.2 g/dL — ABNORMAL LOW (ref 12.0–15.0)
Immature Granulocytes: 0 %
Lymphocytes Relative: 19 %
Lymphs Abs: 0.5 10*3/uL — ABNORMAL LOW (ref 0.7–4.0)
MCH: 32.2 pg (ref 26.0–34.0)
MCHC: 32.9 g/dL (ref 30.0–36.0)
MCV: 97.6 fL (ref 80.0–100.0)
Monocytes Absolute: 0.4 10*3/uL (ref 0.1–1.0)
Monocytes Relative: 14 %
Neutro Abs: 1.8 10*3/uL (ref 1.7–7.7)
Neutrophils Relative %: 67 %
Platelet Count: 80 10*3/uL — ABNORMAL LOW (ref 150–400)
RBC: 2.55 MIL/uL — ABNORMAL LOW (ref 3.87–5.11)
RDW: 23.4 % — ABNORMAL HIGH (ref 11.5–15.5)
WBC Count: 2.8 10*3/uL — ABNORMAL LOW (ref 4.0–10.5)
nRBC: 0 % (ref 0.0–0.2)

## 2019-08-27 LAB — CMP (CANCER CENTER ONLY)
ALT: 14 U/L (ref 0–44)
AST: 18 U/L (ref 15–41)
Albumin: 3.3 g/dL — ABNORMAL LOW (ref 3.5–5.0)
Alkaline Phosphatase: 133 U/L — ABNORMAL HIGH (ref 38–126)
Anion gap: 12 (ref 5–15)
BUN: 27 mg/dL — ABNORMAL HIGH (ref 6–20)
CO2: 34 mmol/L — ABNORMAL HIGH (ref 22–32)
Calcium: 9.3 mg/dL (ref 8.9–10.3)
Chloride: 87 mmol/L — ABNORMAL LOW (ref 98–111)
Creatinine: 1.02 mg/dL — ABNORMAL HIGH (ref 0.44–1.00)
GFR, Est AFR Am: >60 mL/min (ref >60)
GFR, Estimated: >60 mL/min (ref >60)
Glucose, Bld: 108 mg/dL — ABNORMAL HIGH (ref 70–99)
Potassium: 3.2 mmol/L — ABNORMAL LOW (ref 3.5–5.1)
Sodium: 133 mmol/L — ABNORMAL LOW (ref 135–145)
Total Bilirubin: 0.6 mg/dL (ref 0.3–1.2)
Total Protein: 7.5 g/dL (ref 6.5–8.1)

## 2019-08-27 LAB — SAMPLE TO BLOOD BANK

## 2019-08-27 LAB — MAGNESIUM: Magnesium: 1.3 mg/dL — CL (ref 1.7–2.4)

## 2019-08-27 MED ORDER — POTASSIUM CHLORIDE CRYS ER 20 MEQ PO TBCR
EXTENDED_RELEASE_TABLET | ORAL | Status: AC
  Filled 2019-08-27: qty 2

## 2019-08-27 MED ORDER — HEPARIN SOD (PORK) LOCK FLUSH 100 UNIT/ML IV SOLN
500.0000 [IU] | Freq: Once | INTRAVENOUS | Status: AC
  Administered 2019-08-27: 500 [IU]
  Filled 2019-08-27: qty 5

## 2019-08-27 MED ORDER — SODIUM CHLORIDE 0.9% FLUSH
10.0000 mL | Freq: Once | INTRAVENOUS | Status: AC
  Administered 2019-08-27: 10 mL
  Filled 2019-08-27: qty 10

## 2019-08-27 MED ORDER — SODIUM CHLORIDE 0.9 % IV SOLN
6.0000 g | Freq: Once | INTRAVENOUS | Status: AC
  Administered 2019-08-27: 6 g via INTRAVENOUS
  Filled 2019-08-27: qty 12

## 2019-08-27 MED ORDER — POTASSIUM CHLORIDE CRYS ER 20 MEQ PO TBCR
40.0000 meq | EXTENDED_RELEASE_TABLET | Freq: Once | ORAL | Status: AC
  Administered 2019-08-27: 40 meq via ORAL

## 2019-08-27 MED ORDER — METOCLOPRAMIDE HCL 10 MG PO TABS
5.0000 mg | ORAL_TABLET | Freq: Once | ORAL | Status: AC
  Administered 2019-08-27: 5 mg via ORAL
  Filled 2019-08-27: qty 0.5

## 2019-08-27 NOTE — Telephone Encounter (Signed)
Received call from pt with complaint of increase weakness.  Pt states she has not been able to keep food down over the weekend and vomits right after eating.  Pt states she is not able to tolerate liquids either and is feeling dehydrated.  Pt denies constipation or diarrhea.  Per MD pt to be seen in [facility] today for further evaluation and treatment.  Learta Codding, RN notified and high priority message sent to scheduling.

## 2019-08-27 NOTE — Patient Instructions (Signed)
Hypomagnesemia Hypomagnesemia is a condition in which the level of magnesium in the blood is low. Magnesium is a mineral that is found in many foods. It is used in many different processes in the body. Hypomagnesemia can affect every organ in the body. In severe cases, it can cause life-threatening problems. What are the causes? This condition may be caused by:  Not getting enough magnesium in your diet.  Malnutrition.  Problems with absorbing magnesium from the intestines.  Dehydration.  Alcohol abuse.  Vomiting.  Severe or chronic diarrhea.  Some medicines, including medicines that make you urinate more (diuretics).  Certain diseases, such as kidney disease, diabetes, celiac disease, and overactive thyroid. What are the signs or symptoms? Symptoms of this condition include:  Loss of appetite.  Nausea and vomiting.  Involuntary shaking or trembling of a body part (tremor).  Muscle weakness.  Tingling in the arms and legs.  Sudden tightening of muscles (muscle spasms).  Confusion.  Psychiatric issues, such as depression, irritability, or psychosis.  A feeling of fluttering of the heart.  Seizures. These symptoms are more severe if magnesium levels drop suddenly. How is this diagnosed? This condition may be diagnosed based on:  Your symptoms and medical history.  A physical exam.  Blood and urine tests. How is this treated? Treatment depends on the cause and the severity of the condition. It may be treated with:  A magnesium supplement. This can be taken in pill form. If the condition is severe, magnesium is usually given through an IV.  Changes to your diet. You may be directed to eat foods that have a lot of magnesium, such as green leafy vegetables, peas, beans, and nuts.  Stopping any intake of alcohol. Follow these instructions at home:      Make sure that your diet includes foods with magnesium. Foods that have a lot of magnesium in them  include: ? Green leafy vegetables, such as spinach and broccoli. ? Beans and peas. ? Nuts and seeds, such as almonds and sunflower seeds. ? Whole grains, such as whole grain bread and fortified cereals.  Take magnesium supplements if your health care provider tells you to do that. Take them as directed.  Take over-the-counter and prescription medicines only as told by your health care provider.  Have your magnesium levels monitored as told by your health care provider.  When you are active, drink fluids that contain electrolytes.  Avoid drinking alcohol.  Keep all follow-up visits as told by your health care provider. This is important. Contact a health care provider if:  You get worse instead of better.  Your symptoms return. Get help right away if you:  Develop severe muscle weakness.  Have trouble breathing.  Feel that your heart is racing. Summary  Hypomagnesemia is a condition in which the level of magnesium in the blood is low.  Hypomagnesemia can affect every organ in the body.  Treatment may include eating more foods that contain magnesium, taking magnesium supplements, and not drinking alcohol.  Have your magnesium levels monitored as told by your health care provider. This information is not intended to replace advice given to you by your health care provider. Make sure you discuss any questions you have with your health care provider. Document Revised: 01/14/2017 Document Reviewed: 01/03/2017 Elsevier Patient Education  2020 [employer]  
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
## 2019-08-28 ENCOUNTER — Inpatient Hospital Stay: Payer: 59

## 2019-08-28 ENCOUNTER — Other Ambulatory Visit: Payer: Self-pay | Admitting: Medical

## 2019-08-28 ENCOUNTER — Inpatient Hospital Stay: Payer: 59 | Admitting: Adult Health

## 2019-08-28 ENCOUNTER — Telehealth: Payer: Self-pay | Admitting: Oncology

## 2019-08-28 DIAGNOSIS — R1114 Bilious vomiting: Secondary | ICD-10-CM

## 2019-08-28 DIAGNOSIS — R112 Nausea with vomiting, unspecified: Secondary | ICD-10-CM

## 2019-08-28 NOTE — Telephone Encounter (Signed)
Scheduled appt per 7/13 sch msg - pt is aware of appts scheduled for this wk

## 2019-08-30 ENCOUNTER — Inpatient Hospital Stay (HOSPITAL_COMMUNITY)
Admission: EM | Admit: 2019-08-30 | Discharge: 2019-09-04 | DRG: 754 | Disposition: A | Discharge status: Home Health | Payer: 59 | Attending: Internal Medicine | Admitting: Internal Medicine

## 2019-08-30 ENCOUNTER — Other Ambulatory Visit: Payer: Self-pay

## 2019-08-30 ENCOUNTER — Other Ambulatory Visit: Payer: Self-pay | Admitting: Oncology

## 2019-08-30 ENCOUNTER — Emergency Department (HOSPITAL_COMMUNITY): Payer: 59

## 2019-08-30 ENCOUNTER — Encounter (HOSPITAL_COMMUNITY): Payer: Self-pay

## 2019-08-30 DIAGNOSIS — J45909 Unspecified asthma, uncomplicated: Secondary | ICD-10-CM | POA: Diagnosis present

## 2019-08-30 DIAGNOSIS — Z20822 Contact with and (suspected) exposure to covid-19: Secondary | ICD-10-CM | POA: Diagnosis present

## 2019-08-30 DIAGNOSIS — C5701 Malignant neoplasm of right fallopian tube: Secondary | ICD-10-CM | POA: Diagnosis present

## 2019-08-30 DIAGNOSIS — I959 Hypotension, unspecified: Secondary | ICD-10-CM | POA: Diagnosis present

## 2019-08-30 DIAGNOSIS — E43 Unspecified severe protein-calorie malnutrition: Secondary | ICD-10-CM | POA: Diagnosis present

## 2019-08-30 DIAGNOSIS — Z8544 Personal history of malignant neoplasm of other female genital organs: Secondary | ICD-10-CM

## 2019-08-30 DIAGNOSIS — Z823 Family history of stroke: Secondary | ICD-10-CM

## 2019-08-30 DIAGNOSIS — Z803 Family history of malignant neoplasm of breast: Secondary | ICD-10-CM

## 2019-08-30 DIAGNOSIS — R1012 Left upper quadrant pain: Secondary | ICD-10-CM

## 2019-08-30 DIAGNOSIS — Z79891 Long term (current) use of opiate analgesic: Secondary | ICD-10-CM

## 2019-08-30 DIAGNOSIS — Z807 Family history of other malignant neoplasms of lymphoid, hematopoietic and related tissues: Secondary | ICD-10-CM | POA: Diagnosis not present

## 2019-08-30 DIAGNOSIS — Z9049 Acquired absence of other specified parts of digestive tract: Secondary | ICD-10-CM | POA: Diagnosis not present

## 2019-08-30 DIAGNOSIS — F502 Bulimia nervosa: Secondary | ICD-10-CM

## 2019-08-30 DIAGNOSIS — Z79899 Other long term (current) drug therapy: Secondary | ICD-10-CM

## 2019-08-30 DIAGNOSIS — R112 Nausea with vomiting, unspecified: Secondary | ICD-10-CM | POA: Diagnosis present

## 2019-08-30 DIAGNOSIS — Z515 Encounter for palliative care: Secondary | ICD-10-CM | POA: Diagnosis not present

## 2019-08-30 DIAGNOSIS — Z95828 Presence of other vascular implants and grafts: Secondary | ICD-10-CM | POA: Diagnosis not present

## 2019-08-30 DIAGNOSIS — C786 Secondary malignant neoplasm of retroperitoneum and peritoneum: Secondary | ICD-10-CM | POA: Diagnosis present

## 2019-08-30 DIAGNOSIS — E861 Hypovolemia: Secondary | ICD-10-CM | POA: Diagnosis present

## 2019-08-30 DIAGNOSIS — T451X5A Adverse effect of antineoplastic and immunosuppressive drugs, initial encounter: Secondary | ICD-10-CM | POA: Diagnosis present

## 2019-08-30 DIAGNOSIS — Z90710 Acquired absence of both cervix and uterus: Secondary | ICD-10-CM

## 2019-08-30 DIAGNOSIS — D638 Anemia in other chronic diseases classified elsewhere: Secondary | ICD-10-CM | POA: Diagnosis present

## 2019-08-30 DIAGNOSIS — N179 Acute kidney failure, unspecified: Secondary | ICD-10-CM | POA: Diagnosis present

## 2019-08-30 DIAGNOSIS — Z8249 Family history of ischemic heart disease and other diseases of the circulatory system: Secondary | ICD-10-CM

## 2019-08-30 DIAGNOSIS — Z8051 Family history of malignant neoplasm of kidney: Secondary | ICD-10-CM

## 2019-08-30 DIAGNOSIS — E876 Hypokalemia: Secondary | ICD-10-CM | POA: Diagnosis present

## 2019-08-30 DIAGNOSIS — C762 Malignant neoplasm of abdomen: Secondary | ICD-10-CM | POA: Diagnosis not present

## 2019-08-30 DIAGNOSIS — E860 Dehydration: Secondary | ICD-10-CM | POA: Diagnosis present

## 2019-08-30 DIAGNOSIS — Z808 Family history of malignant neoplasm of other organs or systems: Secondary | ICD-10-CM | POA: Diagnosis not present

## 2019-08-30 DIAGNOSIS — Z791 Long term (current) use of non-steroidal anti-inflammatories (NSAID): Secondary | ICD-10-CM

## 2019-08-30 DIAGNOSIS — Z8543 Personal history of malignant neoplasm of ovary: Secondary | ICD-10-CM

## 2019-08-30 DIAGNOSIS — K56609 Unspecified intestinal obstruction, unspecified as to partial versus complete obstruction: Secondary | ICD-10-CM

## 2019-08-30 DIAGNOSIS — D6481 Anemia due to antineoplastic chemotherapy: Secondary | ICD-10-CM | POA: Diagnosis present

## 2019-08-30 LAB — COMPREHENSIVE METABOLIC PANEL
ALT: 21 U/L (ref 0–44)
AST: 20 U/L (ref 15–41)
Albumin: 4.1 g/dL (ref 3.5–5.0)
Alkaline Phosphatase: 153 U/L — ABNORMAL HIGH (ref 38–126)
Anion gap: 21 — ABNORMAL HIGH (ref 5–15)
BUN: 43 mg/dL — ABNORMAL HIGH (ref 6–20)
CO2: 40 mmol/L — ABNORMAL HIGH (ref 22–32)
Calcium: 9.6 mg/dL (ref 8.9–10.3)
Chloride: 70 mmol/L — ABNORMAL LOW (ref 98–111)
Creatinine, Ser: 1.35 mg/dL — ABNORMAL HIGH (ref 0.44–1.00)
GFR calc Af Amer: 51 mL/min — ABNORMAL LOW (ref >60)
GFR calc non Af Amer: 44 mL/min — ABNORMAL LOW (ref >60)
Glucose, Bld: 114 mg/dL — ABNORMAL HIGH (ref 70–99)
Potassium: 3 mmol/L — ABNORMAL LOW (ref 3.5–5.1)
Sodium: 131 mmol/L — ABNORMAL LOW (ref 135–145)
Total Bilirubin: 1 mg/dL (ref 0.3–1.2)
Total Protein: 8.2 g/dL — ABNORMAL HIGH (ref 6.5–8.1)

## 2019-08-30 LAB — CBC WITH DIFFERENTIAL/PLATELET
Abs Immature Granulocytes: 0.02 10*3/uL (ref 0.00–0.07)
Basophils Absolute: 0 10*3/uL (ref 0.0–0.1)
Basophils Relative: 0 %
Eosinophils Absolute: 0 10*3/uL (ref 0.0–0.5)
Eosinophils Relative: 0 %
HCT: 26.6 % — ABNORMAL LOW (ref 36.0–46.0)
Hemoglobin: 8.9 g/dL — ABNORMAL LOW (ref 12.0–15.0)
Immature Granulocytes: 0 %
Lymphocytes Relative: 15 %
Lymphs Abs: 0.7 10*3/uL (ref 0.7–4.0)
MCH: 32.6 pg (ref 26.0–34.0)
MCHC: 33.5 g/dL (ref 30.0–36.0)
MCV: 97.4 fL (ref 80.0–100.0)
Monocytes Absolute: 0.7 10*3/uL (ref 0.1–1.0)
Monocytes Relative: 15 %
Neutro Abs: 3.2 10*3/uL (ref 1.7–7.7)
Neutrophils Relative %: 70 %
Platelets: 173 10*3/uL (ref 150–400)
RBC: 2.73 MIL/uL — ABNORMAL LOW (ref 3.87–5.11)
RDW: 23.1 % — ABNORMAL HIGH (ref 11.5–15.5)
WBC: 4.6 10*3/uL (ref 4.0–10.5)
nRBC: 0 % (ref 0.0–0.2)

## 2019-08-30 LAB — URINALYSIS, ROUTINE W REFLEX MICROSCOPIC
Bilirubin Urine: NEGATIVE
Glucose, UA: NEGATIVE mg/dL
Hgb urine dipstick: NEGATIVE
Ketones, ur: 20 mg/dL — AB
Nitrite: NEGATIVE
Protein, ur: 30 mg/dL — AB
Specific Gravity, Urine: 1.041 — ABNORMAL HIGH (ref 1.005–1.030)
pH: 6 (ref 5.0–8.0)

## 2019-08-30 LAB — I-STAT CHEM 8, ED
BUN: 41 mg/dL — ABNORMAL HIGH (ref 6–20)
Calcium, Ion: 1.01 mmol/L — ABNORMAL LOW (ref 1.15–1.40)
Chloride: 72 mmol/L — ABNORMAL LOW (ref 98–111)
Creatinine, Ser: 1.5 mg/dL — ABNORMAL HIGH (ref 0.44–1.00)
Glucose, Bld: 113 mg/dL — ABNORMAL HIGH (ref 70–99)
HCT: 28 % — ABNORMAL LOW (ref 36.0–46.0)
Hemoglobin: 9.5 g/dL — ABNORMAL LOW (ref 12.0–15.0)
Potassium: 2.9 mmol/L — ABNORMAL LOW (ref 3.5–5.1)
Sodium: 127 mmol/L — ABNORMAL LOW (ref 135–145)
TCO2: 44 mmol/L — ABNORMAL HIGH (ref 22–32)

## 2019-08-30 LAB — SARS CORONAVIRUS 2 BY RT PCR (HOSPITAL ORDER, PERFORMED IN ~~LOC~~ HOSPITAL LAB): SARS Coronavirus 2: NEGATIVE

## 2019-08-30 LAB — LIPASE, BLOOD: Lipase: 31 U/L (ref 11–51)

## 2019-08-30 LAB — MAGNESIUM: Magnesium: 2.4 mg/dL (ref 1.7–2.4)

## 2019-08-30 MED ORDER — METRONIDAZOLE IN NACL 5-0.79 MG/ML-% IV SOLN: 500.0000 mg | Freq: Three times a day (TID) | INTRAVENOUS | Status: DC

## 2019-08-30 MED ORDER — METRONIDAZOLE IN NACL 5-0.79 MG/ML-% IV SOLN
500.0000 mg | Freq: Three times a day (TID) | INTRAVENOUS | Status: DC
  Administered 2019-08-31 (×3): 500 mg via INTRAVENOUS
  Filled 2019-08-30 (×3): qty 100

## 2019-08-30 MED ORDER — GABAPENTIN 100 MG PO CAPS
200.0000 mg | ORAL_CAPSULE | Freq: Every day | ORAL | Status: DC
  Administered 2019-08-30: 200 mg via ORAL
  Filled 2019-08-30: qty 2

## 2019-08-30 MED ORDER — ONDANSETRON HCL 4 MG PO TABS: 4.0000 mg | ORAL_TABLET | Freq: Four times a day (QID) | ORAL | Status: DC | PRN

## 2019-08-30 MED ORDER — IOHEXOL 300 MG/ML  SOLN
75.0000 mL | Freq: Once | INTRAMUSCULAR | Status: AC | PRN
  Administered 2019-08-30: 70 mL via INTRAVENOUS

## 2019-08-30 MED ORDER — POTASSIUM CHLORIDE 10 MEQ/100ML IV SOLN
10.0000 meq | INTRAVENOUS | Status: AC
  Administered 2019-08-30 (×2): 10 meq via INTRAVENOUS
  Filled 2019-08-30 (×2): qty 100

## 2019-08-30 MED ORDER — SODIUM CHLORIDE 0.9 % IV BOLUS
250.0000 mL | Freq: Once | INTRAVENOUS | Status: AC
  Administered 2019-08-31: 250 mL via INTRAVENOUS

## 2019-08-30 MED ORDER — LIDOCAINE 5 % EX PTCH
1.0000 | MEDICATED_PATCH | CUTANEOUS | Status: DC
  Administered 2019-08-30 – 2019-09-04 (×5): 1 via TRANSDERMAL
  Filled 2019-08-30 (×6): qty 1

## 2019-08-30 MED ORDER — ALBUTEROL SULFATE (2.5 MG/3ML) 0.083% IN NEBU: 2.5000 mg | INHALATION_SOLUTION | Freq: Four times a day (QID) | RESPIRATORY_TRACT | Status: DC | PRN

## 2019-08-30 MED ORDER — ALBUTEROL SULFATE HFA 108 (90 BASE) MCG/ACT IN AERS: 2.0000 | INHALATION_SPRAY | Freq: Four times a day (QID) | RESPIRATORY_TRACT | Status: DC | PRN

## 2019-08-30 MED ORDER — POTASSIUM CHLORIDE IN NACL 20-0.9 MEQ/L-% IV SOLN
INTRAVENOUS | Status: DC
  Filled 2019-08-30 (×2): qty 1000

## 2019-08-30 MED ORDER — CIPROFLOXACIN IN D5W 400 MG/200ML IV SOLN
400.0000 mg | Freq: Once | INTRAVENOUS | Status: AC
  Administered 2019-08-30: 400 mg via INTRAVENOUS
  Filled 2019-08-30: qty 200

## 2019-08-30 MED ORDER — ONDANSETRON HCL 4 MG/2ML IJ SOLN
4.0000 mg | Freq: Four times a day (QID) | INTRAMUSCULAR | Status: DC | PRN
  Administered 2019-08-30 – 2019-09-01 (×3): 4 mg via INTRAVENOUS
  Filled 2019-08-30 (×3): qty 2

## 2019-08-30 MED ORDER — LACTATED RINGERS IV BOLUS
1000.0000 mL | Freq: Once | INTRAVENOUS | Status: AC
  Administered 2019-08-30: 1000 mL via INTRAVENOUS

## 2019-08-30 NOTE — H&P (Signed)
History and Physical    Kathryn Ramsey QQV:[id number] DOB: May 12, 1965 DOA: 08/30/2019  PCP: Patient, No Pcp Per  Patient coming from: Home.  Chief Complaint: Nausea vomiting.  HPI: Kathryn Ramsey is a 54 y.o. female with history of metastatic fallopian tube cancer with carcinomatosis was referred to the ER the patient was having persistent nausea vomiting.  Patient has been any nausea vomiting which is acutely worsened last 2 days and last bowel movement almost a week ago.  Patient is being followed by [facility] gastroenterologist and is planned to have a stent placed in small bowel but due to worsening vomiting patient presents to the ER after discussing with oncologist.  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  ER physician has discussed with on-call oncologist and gastroenterologist.  Gastroenterologist is recommending NG tube placement IV antibiotics fluids n.p.o.  NGT was unable to be placed despite multiple attempts.  Patient was started on Flagyl and Cipro.  Labs are remarkable for acute renal failure with creatinine of 1.3 and hemoglobin is around 8.9 Covid test was negative.  Potassium was low at 3.  Review of Systems: As per HPI, rest all negative.   Past Medical History:  Diagnosis Date  . Acute sensory neuropathy 06/26/2015  . Allergy   . Anemia   . Asthma    illness induced asthma  . Blood transfusion without reported diagnosis    at age 36 years old D/T surgery to femur being crushed  . 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 Heart murmur    pt, states had a "working heart murmur"  . History of kidney stones   . Neutropenia, drug-induced ([place]) 03/10/2018  . PONV (postoperative nausea and vomiting)   . Skin cancer     Past Surgical History:  Procedure Laterality Date  . 3 laporscopic proceedures    . ABDOMINAL HYSTERECTOMY      2010  . CHOLECYSTECTOMY     2010  . DILATION AND CURETTAGE OF UTERUS     1997  . IR IMAGING GUIDED PORT INSERTION  03/08/2018  . LAPAROSCOPY N/A 03/27/2015   Procedure: DIAGNOSTIC LAPAROSCOPY ;  Surgeon: [name], MD;  Location: [facility];  Service: Gynecology;  Laterality: N/A;  . LAPAROSCOPY N/A 02/24/2016   Procedure: LAPAROSCOPY DIAGNOSTIC WITH PERITONEAL WASHINGS AND PERITONEAL BIOPSY;  Surgeon: [name], MD;  Location: [facility];  Service: Gynecology;  Laterality: N/A;  . LAPAROTOMY N/A 03/27/2015   Procedure: EXPLORATORY LAPAROTOMY, BILATERAL SALPINGO OOPHORECTOMY, OMENTECTOMY, RADICAL TUMOR DEBULKING;  Surgeon: [name], MD;  Location: [facility];  Service: Gynecology;  Laterality: N/A;  . sinus surgery 1994       reports that she has never smoked. She has never used smokeless tobacco. She reports current alcohol use. She reports that she does not use drugs.  Allergies  Allergen Reactions  . Bee Venom Shortness Of Breath    swelling  . Prochlorperazine Edisylate Anaphylaxis, Anxiety and Palpitations    Anaphylaxis, Anxiety and Palpitations   . Sulfa Antibiotics Shortness Of Breath    Vomit , diarrhea , hives  . Phenergan [Promethazine Hcl] Other (See Comments)    "funny sensation in my throat"  . Zarxio [Filgrastim]     Family History  Problem Relation Age of Onset  . Hypertension Father   . Skin cancer Father  nonmelanoma skin cancers in his late 67s  . Non-Hodgkin's lymphoma Maternal Aunt        dx. 75s; smoker  . Stroke Maternal Grandmother   . Other Mother        benign meningioma dx. early-mid-70s; hysterectomy in her late 81s for heavy periods - still has ovaries  . Other Son        one son with pre-cancerous skin findings  . Other Daughter        cysts on ovaries and hx of heavy periods  . Other Sister        hysterectomy for cysts - still has ovaries  . Heart attack Maternal Grandfather   . Heart attack Paternal Grandfather   . Renal cancer Maternal Aunt 60         smoker  . Breast cancer Maternal Aunt 78  . Other Maternal Aunt        dx. benign brain tumor (meningioma) at 33; 2nd benign brain tumor in her late 81s; hx of radical hysterectomy at age 83  . Brain cancer Other        NOS tumor    Prior to Admission medications   Medication Sig Start Date End Date Taking? Authorizing Provider  acetaminophen (TYLENOL) 500 MG tablet Take 2 tablets by mouth 3 (three) times daily as needed for moderate pain.  11/16/18  Yes [provider]  albuterol (VENTOLIN HFA) 108 (90 Base) MCG/ACT inhaler Inhale 2 puffs into the lungs every 6 (six) hours as needed for wheezing or shortness of breath.  01/19/19 01/19/20 Yes [provider]  dexamethasone (DECADRON) 2 MG tablet Take 2 mg by mouth daily.   Yes [provider]  dicyclomine (BENTYL) 10 MG capsule TAKE 1 CAPSULE 4 TIMES DAILY BEFORE MEALS AND AT BEDTIME. Patient taking differently: Take 10 mg by mouth in the morning, at noon, in the evening, and at bedtime.  07/20/19  Yes Causey, Charlestine Massed, NP  gabapentin (NEURONTIN) 100 MG capsule 200 mg bid with 300 mg qhs Patient taking differently: Take 200 mg by mouth at bedtime.  03/22/19  Yes Magrinat, Virgie Dad, MD  lactulose (CHRONULAC) 10 GM/15ML solution Take 10 g by mouth daily as needed for moderate constipation.  08/17/19  Yes [provider]  lidocaine (LIDODERM) 5 % Place 1 patch onto the skin daily. Remove & Discard patch within 12 hours or as directed by MD   Yes [provider]  LORazepam (ATIVAN) 0.5 MG tablet TAKE 1 TABLET AT BEDTIME AS NEEDED FOR NAUSEA AND VOMITING. Patient taking differently: Take 0.5 mg by mouth at bedtime as needed (nausea/vomiting).  08/17/19  Yes Causey, Charlestine Massed, NP  metoCLOPramide (REGLAN) 5 MG tablet Take 2 tablets (10 mg total) by mouth every 8 (eight) hours as needed for nausea. 05/15/19  Yes Magrinat, Virgie Dad, MD  omeprazole (PRILOSEC) 40 MG capsule Take 1 capsule (40 mg  total) by mouth daily. 05/22/18  Yes Magrinat, Virgie Dad, MD  sertraline (ZOLOFT) 50 MG tablet TAKE 1 & 1/2 TABLETS ONCE DAILY. Patient taking differently: Take 75 mg by mouth daily.  08/02/19  Yes Magrinat, Virgie Dad, MD  traMADol (ULTRAM) 50 MG tablet TAKE ONE TABLET EVERY 6 HOURS AS NEEDED. Patient taking differently: Take 50 mg by mouth every 6 (six) hours as needed for moderate pain or severe pain.  07/20/19  Yes Magrinat, Virgie Dad, MD  vitamin B-12 (CYANOCOBALAMIN) 1000 MCG tablet Take 1 tablet by mouth daily.   Yes [provider]  dexamethasone (DECADRON) 4 MG tablet Take 2 tablets by mouth once a day for 3 days after chemo. Take with food. Patient not taking: Reported on 08/30/2019 05/04/19   Magrinat, Virgie Dad, MD  diphenhydrAMINE (BENADRYL) 50 MG capsule 50 mg Diphenhydramine (Benadryl) PO within 1 hour of the injection 11/28/17 11/28/17  Causey, Charlestine Massed, NP  fluticasone (FLONASE) 50 MCG/ACT nasal spray Place 1 spray into both nostrils 2 (two) times daily. Patient not taking: Reported on 08/30/2019 08/16/17   Harle Stanford., PA-C  ibuprofen (ADVIL) 600 MG tablet Take 1 tablet by mouth 4 (four) times daily as needed. Patient not taking: Reported on 08/30/2019 11/16/18   [provider]  lidocaine-prilocaine (EMLA) cream APPLY TOPICALLY AS NEEDED. Patient not taking: Reported on 08/30/2019 03/29/18   Magrinat, Virgie Dad, MD  loratadine (CLARITIN) 10 MG tablet Take 10 mg by mouth as needed.  Patient not taking: Reported on 08/30/2019    [provider]  Magnesium (V-R MAGNESIUM) 250 MG TABS Take by mouth. Patient not taking: Reported on 08/30/2019    [provider]  magnesium oxide (MAG-OX) 400 (241.3 Mg) MG tablet Take 1 tablet (400 mg total) by mouth daily. Patient not taking: Reported on 08/30/2019 02/17/18   Magrinat, Virgie Dad, MD  Multiple Vitamins-Minerals (MULTIVITAMIN ADULT PO) Take 1 tablet by mouth daily. Patient not taking: Reported on 08/30/2019     [provider]  Omega 3 1000 MG CAPS Take 1 capsule by mouth daily. Patient not taking: Reported on 08/30/2019    [provider]  prochlorperazine (COMPAZINE) 5 MG tablet Take 1 tablet (5 mg total) by mouth every 8 (eight) hours as needed for nausea or vomiting. Patient not taking: Reported on 08/30/2019 06/07/19   Magrinat, Virgie Dad, MD  promethazine (PHENERGAN) 25 MG suppository Place 1 suppository (25 mg total) rectally every 6 (six) hours as needed for nausea or vomiting. Patient not taking: Reported on 08/30/2019 05/24/19   Magrinat, Virgie Dad, MD  scopolamine (TRANSDERM-SCOP) 1 MG/3DAYS APPLY 1 PATCH EVERY 3 DAYS AS NEEDED FOR NAUSEA. Patient not taking: Reported on 08/30/2019 07/13/19   Magrinat, Virgie Dad, MD  scopolamine (TRANSDERM-SCOP) 1 MG/3DAYS Place 1 patch (1.5 mg total) onto the skin every 3 (three) days. Patient not taking: Reported on 08/30/2019 07/03/19   Magrinat, Virgie Dad, MD  senna-docusate (SENOKOT-S) 8.6-50 MG tablet Take 1-2 tablets by mouth 2 (two) times daily as needed. Patient not taking: Reported on 08/30/2019 11/21/18   [provider]  valACYclovir (VALTREX) 1000 MG tablet Take 1 tablet (1,000 mg total) by mouth daily. Patient not taking: Reported on 08/30/2019 05/22/18   Magrinat, Virgie Dad, MD  VENTOLIN HFA 108 [phone] Base) MCG/ACT inhaler  01/19/19   [provider]    Physical Exam: Constitutional: Moderately built and nourished. Vitals:   08/30/19 1609 08/30/19 1844 08/30/19 1937  BP: 90/70 104/77 106/79  Pulse: (!) 109 95 82  Resp: 15 16 16   Temp: 97.9 F (36.6 C)    TempSrc: Oral    SpO2: 96% 98% 98%   Eyes: Anicteric no pallor. ENMT: No discharge from the ears eyes nose or mouth. Neck: No mass felt.  No neck rigidity. Respiratory: No rhonchi or crepitations. Cardiovascular: S1-S2 heard. Abdomen: Soft bowel sounds not appreciated.  No guarding or rigidity. Musculoskeletal: No edema. Skin: No rash. Neurologic: Alert awake  oriented to time place and person.  Moves all extremities. Psychiatric: Appears normal but normal affect.   Labs on Admission:  I have personally reviewed following labs and imaging studies  CBC: Recent Labs  Lab 08/27/19 1125 08/30/19 1904 08/30/19 1917  WBC 2.8* 4.6  --   NEUTROABS 1.8 3.2  --   HGB 8.2* 8.9* 9.5*  HCT 24.9* 26.6* 28.0*  MCV 97.6 97.4  --   PLT 80* 173  --    Basic Metabolic Panel: Recent Labs  Lab 08/27/19 1125 08/30/19 1904 08/30/19 1917  NA 133* 131* 127*  K 3.2* 3.0* 2.9*  CL 87* 70* 72*  CO2 34* 40*  --   GLUCOSE 108* 114* 113*  BUN 27* 43* 41*  CREATININE 1.02* 1.35* 1.50*  CALCIUM 9.3 9.6  --   MG 1.3* 2.4  --    GFR: Estimated Creatinine Clearance: 24.5 mL/min (A) (by C-G formula based on SCr of 1.5 mg/dL (H)). Liver Function Tests: Recent Labs  Lab 08/27/19 1125 08/30/19 1904  AST 18 20  ALT 14 21  ALKPHOS 133* 153*  BILITOT 0.6 1.0  PROT 7.5 8.2*  ALBUMIN 3.3* 4.1   Recent Labs  Lab 08/30/19 1904  LIPASE 31   No results for input(s): AMMONIA in the last 168 hours. Coagulation Profile: No results for input(s): INR, PROTIME in the last 168 hours. Cardiac Enzymes: No results for input(s): CKTOTAL, CKMB, CKMBINDEX, TROPONINI in the last 168 hours. BNP (last 3 results) No results for input(s): PROBNP in the last 8760 hours. HbA1C: No results for input(s): HGBA1C in the last 72 hours. CBG: No results for input(s): GLUCAP in the last 168 hours. Lipid Profile: No results for input(s): CHOL, HDL, LDLCALC, TRIG, CHOLHDL, LDLDIRECT in the last 72 hours. Thyroid Function Tests: No results for input(s): TSH, T4TOTAL, FREET4, T3FREE, THYROIDAB in the last 72 hours. Anemia Panel: No results for input(s): VITAMINB12, FOLATE, FERRITIN, TIBC, IRON, RETICCTPCT in the last 72 hours. Urine analysis:    Component Value Date/Time   COLORURINE YELLOW 08/30/2019 2036   APPEARANCEUR HAZY (A) 08/30/2019 2036   LABSPEC 1.041 (H) 08/30/2019  2036   LABSPEC 1.005 10/09/2015 1239   PHURINE 6.0 08/30/2019 2036   GLUCOSEU NEGATIVE 08/30/2019 2036   GLUCOSEU Negative 10/09/2015 1239   HGBUR NEGATIVE 08/30/2019 2036   BILIRUBINUR NEGATIVE 08/30/2019 2036   BILIRUBINUR Negative 10/09/2015 1239   KETONESUR 20 (A) 08/30/2019 2036   PROTEINUR 30 (A) 08/30/2019 2036   UROBILINOGEN 0.2 10/09/2015 1239   NITRITE NEGATIVE 08/30/2019 2036   LEUKOCYTESUR SMALL (A) 08/30/2019 2036   LEUKOCYTESUR Negative 10/09/2015 1239   Sepsis Labs: @LABRCNTIP (procalcitonin:4,lacticidven:4) ) Recent Results (from the past 240 hour(s))  SARS Coronavirus 2 by RT PCR (hospital order, performed in [place] hospital lab) Nasopharyngeal Nasopharyngeal Swab     Status: None   Collection Time: 08/30/19  7:04 PM   Specimen: Nasopharyngeal Swab  Result Value Ref Range Status   SARS Coronavirus 2 NEGATIVE NEGATIVE Final    Comment: (NOTE) SARS-CoV-2 target nucleic acids are NOT DETECTED.  The SARS-CoV-2 RNA is generally detectable in upper and lower respiratory specimens during the acute phase of infection. The lowest concentration of SARS-CoV-2 viral copies this assay can detect is 250 copies / mL. A negative result does not preclude SARS-CoV-2 infection and should not be used as the sole basis for treatment or other patient management decisions.  A negative result may occur with improper specimen collection / handling, submission of specimen other than nasopharyngeal swab, presence of viral mutation(s) within the areas targeted by this assay, and inadequate number of viral copies (<250 copies /  mL). A negative result must be combined with clinical observations, patient history, and epidemiological information.  Fact Sheet for Patients:   StrictlyIdeas.no  Fact Sheet for Healthcare Providers: BankingDealers.co.za  This test is not yet approved or  cleared by the [place] FDA and has been  authorized for detection and/or diagnosis of SARS-CoV-2 by FDA under an Emergency Use Authorization (EUA).  This EUA will remain in effect (meaning this test can be used) for the duration of the COVID-19 declaration under Section 564(b)(1) of the Act, 21 U.S.C. section 360bbb-3(b)(1), unless the authorization is terminated or revoked sooner.  Performed at [facility], [place] [address] [place], [place] [address]      Radiological Exams on Admission: CT ABDOMEN PELVIS W CONTRAST  Result Date: 08/30/2019 CLINICAL DATA:  Abdominal pain, bilious vomiting, ovarian cancer EXAM: CT ABDOMEN AND PELVIS WITH CONTRAST TECHNIQUE: Multidetector CT imaging of the abdomen and pelvis was performed using the standard protocol following bolus administration of intravenous contrast. CONTRAST:  41mL OMNIPAQUE IOHEXOL 300 MG/ML  SOLN COMPARISON:  07/30/2019 FINDINGS: Lower chest: Moderate left pleural effusion is again identified with compressive atelectasis of the left lung base. Visualized right lung base is clear. Central venous catheter tip noted within the superior cavoatrial junction. Cardiac size within normal limits. Hepatobiliary: Cholecystectomy has been performed. Stable hemangioma within the a inferior right hepatic lobe. The liver is otherwise unremarkable. No intra or extrahepatic biliary ductal dilation identified. Pancreas: Unremarkable Spleen: Unremarkable Adrenals/Urinary Tract: The adrenal glands are unremarkable. Mass effect upon the right kidney by the distended duodenum is again identified, however, the kidneys are otherwise unremarkable. There is normal symmetric renal cortical enhancement. The bladder appears circumferentially mildly thick walled and demonstrates moderate perivesicular inflammatory stranding, similar to prior examination, possibly reflecting a chronic inflammatory process such as a chronic infection or radiation cystitis in the appropriate clinical setting. The  bladder is not distended. Stomach/Bowel: There is massive distension of the a first and second portion of the duodenum to the level of the a point at which it crosses the midline. In this region, however, there is reasonable space between the abdominal aorta and superior mesenteric artery making SMA syndrome unlikely as a cause of obstruction. There is, however, numerous thick walled loops of small bowel again identified clustered within the mid abdomen. There is moderate ascites contain within thickened peritoneum, similar to that noted on prior examination. 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 No focal point of transition is identified, though the caliber of bowel does improve once the ileum exits the encapsulating peritoneum, best seen on axial image # 60/2. No free intraperitoneal gas. The large bowel is unremarkable. Vascular/Lymphatic: No pathologic adenopathy seen within the abdomen and pelvis. The inferior vena cava is compressed, progressive since prior examination, likely a combination of the encapsulating process as well as some degree of hypovolemia. The arterial vasculature is unremarkable. Reproductive: Uterus absent.  No adnexal masses. Other: There is diffuse body wall wasting noted, progressive since prior examination. Musculoskeletal: The osseous structures are unremarkable. IMPRESSION: Diffuse bowel wall thickening and central clumping within encapsulating thickened peritoneum and moderate ascites. 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 This is most commonly seen in the setting of peritoneal dialysis or peritoneal carcinomatosis. Massive distention of the duodenum is progressive since prior examination. Stable chronic bladder wall thickening and perivesicular inflammatory stranding, possibly reflecting changes of a radiation cystitis in the  appropriate clinical setting. Progressive body wall wasting. Electronically Signed   By: Fidela Salisbury MD   On: 08/30/2019 20:35   EKG -shows sinus tachycardia.  Assessment/Plan Principal Problem:   Nausea & vomiting Active Problems:   Abdominal carcinomatosis (HCC)   Cancer of right fallopian tube (HCC)   Anemia due to antineoplastic chemotherapy    1. Nausea vomiting with CT scan showing features consistent with peritoneal carcinomatosis.  Clinical symptoms are concerning for obstruction.  NG tube was attempted but was unable to be placed.  Will consult IR in the morning for NG tube placement.  N.p.o. IV fluids patient is empirically started on antibiotics as requested by [facility] gastroenterologist. 2. Acute renal failure hypokalemia likely from nausea vomiting and dehydration.  We will gently hydrated replace potassium. 3. Anemia likely related to chemotherapy follow CBC. 4. Metastatic fallopian tube cancer being followed by Dr. Jana Hakim. 5. Severe protein calorie malnutrition.  Since patient has intractable nausea vomiting with possible obstruction will need close monitoring for any further worsening in inpatient status.   DVT prophylaxis: SCDs for now. Anticipation of procedure holding off any anticoagulation. Code Status: Full code. Family Communication: Patient's husband. Disposition Plan: Home. Consults called: Gastroenterology and oncologist. Admission status: Inpatient.   Rise Patience MD [facility] Pager [phone].  If 7PM-7AM, please contact night-coverage [email or web address] Password [email or web address]  08/30/2019, 9:18 PM

## 2019-08-30 NOTE — ED Provider Notes (Signed)
[facility] DEPT Provider Note   CSN: [id number] Arrival date & time: 08/30/19  1604     History No chief complaint on file.   Kathryn Ramsey is a 54 y.o. female.  HPI      Kathryn Ramsey is a 54 y.o. female, with a history of carcinomatosis peritonei, anemia, asthma, presenting to the ED with persistent vomiting for the last several days. She endorses multiple episodes of vomiting a day beginning July 9.  Concern for bilious emesis.  Accompanied by upper abdominal discomfort, fatigue, and generalized weakness. Poor oral intake. Today, became lightheaded and had syncopal episode with change in positions.  Patient's husband caught the patient and lowered her to the ground.  He has a question as to whether she completely lost consciousness or not, but does states she came back around quite quickly. Patient is followed by Dr. Jana Hakim with oncology.  Last chemotherapy infusion June 17.  Denies fever/chills, chest pain, shortness of breath, cough, urinary symptoms, diarrhea, or any other complaints.   Past Medical History:  Diagnosis Date  . Acute sensory neuropathy 06/26/2015  . Allergy   . Anemia   . Asthma    illness induced asthma  . Blood transfusion without reported diagnosis    at age 40 years old D/T surgery to femur being crushed  . 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 Heart murmur    pt, states had a "working heart murmur"  . History of kidney stones   . Neutropenia, drug-induced ([place]) 03/10/2018  . PONV (postoperative nausea and vomiting)   . Skin cancer     Patient Active Problem List   Diagnosis Date Noted  . Nausea & vomiting 08/30/2019  . Dysphagia 05/25/2019  . Neuropathy due to chemotherapeutic drug ([place]) 07/24/2018  . Anemia due to antineoplastic chemotherapy 05/22/2018  . Port-A-Cath in place 05/01/2018  . Neutropenia, drug-induced ([place]) 03/10/2018  .  Diarrhea 08/02/2017  . Lichen sclerosus et atrophicus of the vulva 03/31/2016  . Abdominal wall mass of right lower quadrant 03/08/2016  . Carcinomatosis peritonei ([place]) 02/28/2016  . Goals of care, counseling/discussion 02/27/2016  . Elevated CA-125 02/24/2016  . Vaginal dryness, menopausal 10/01/2015  . Drug-induced peripheral neuropathy ([place]) 09/02/2015  . Leukopenia due to antineoplastic chemotherapy ([place]) 09/02/2015  . Chemotherapy-induced neuropathy ([place]) 08/19/2015  . Cellulitis 07/03/2015  . Acute sensory neuropathy 06/26/2015  . High risk medication use 06/21/2015  . Facial paresthesia 06/18/2015  . Genetic testing 06/16/2015  . Restless legs 05/14/2015  . Family history of breast cancer in female 04/29/2015  . Chemotherapy-induced nausea 04/22/2015  . Chemotherapy induced neutropenia ([place]) 04/22/2015  . 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 History of hysterectomy for benign disease 03/21/2015  . IBS (irritable bowel syndrome) 03/21/2015  . Dense breast tissue 03/21/2015  . History of cholecystectomy 03/21/2015  . 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 3 laporscopic proceedures    . ABDOMINAL HYSTERECTOMY     2010  . CHOLECYSTECTOMY     2010  . DILATION AND CURETTAGE OF UTERUS     1997  . IR IMAGING GUIDED PORT INSERTION  03/08/2018  . LAPAROSCOPY N/A 03/27/2015   Procedure: DIAGNOSTIC LAPAROSCOPY ;  Surgeon: [name], MD;  Location: [facility] ORS;  Service: Gynecology;  Laterality: N/A;  . LAPAROSCOPY N/A 02/24/2016   Procedure:  LAPAROSCOPY DIAGNOSTIC WITH PERITONEAL WASHINGS AND PERITONEAL BIOPSY;  Surgeon: [name], MD;  Location: [facility];  Service: Gynecology;  Laterality: N/A;  . LAPAROTOMY N/A 03/27/2015   Procedure: EXPLORATORY LAPAROTOMY, BILATERAL SALPINGO OOPHORECTOMY, OMENTECTOMY, RADICAL TUMOR DEBULKING;  Surgeon: [name], MD;  Location: [facility];  Service: Gynecology;  Laterality: N/A;  .  sinus surgery 1994       OB History   No obstetric history on file.     Family History  Problem Relation Age of Onset  . Hypertension Father   . Skin cancer Father        nonmelanoma skin cancers in his late 46s  . Non-Hodgkin's lymphoma Maternal Aunt        dx. 63s; smoker  . Stroke Maternal Grandmother   . Other Mother        benign meningioma dx. early-mid-70s; hysterectomy in her late 47s for heavy periods - still has ovaries  . Other Son        one son with pre-cancerous skin findings  . Other Daughter        cysts on ovaries and hx of heavy periods  . Other Sister        hysterectomy for cysts - still has ovaries  . Heart attack Maternal Grandfather   . Heart attack Paternal Grandfather   . Renal cancer Maternal Aunt 60       smoker  . Breast cancer Maternal Aunt 78  . Other Maternal Aunt        dx. benign brain tumor (meningioma) at 15; 2nd benign brain tumor in her late 58s; hx of radical hysterectomy at age 54  . Brain cancer Other        NOS tumor    Social History   Tobacco Use  . Smoking status: Never Smoker  . Smokeless tobacco: Never Used  Vaping Use  . Vaping Use: Never used  Substance Use Topics  . Alcohol use: Yes    Comment:  3 glasses wine or beer/week  . Drug use: No    Home Medications Prior to Admission medications   Medication Sig Start Date End Date Taking? Authorizing Provider  acetaminophen (TYLENOL) 500 MG tablet Take 2 tablets by mouth 3 (three) times daily as needed for moderate pain.  11/16/18  Yes [provider]  albuterol (VENTOLIN HFA) 108 (90 Base) MCG/ACT inhaler Inhale 2 puffs into the lungs every 6 (six) hours as needed for wheezing or shortness of breath.  01/19/19 01/19/20 Yes [provider]  dexamethasone (DECADRON) 2 MG tablet Take 2 mg by mouth daily.   Yes [provider]  dicyclomine (BENTYL) 10 MG capsule TAKE 1 CAPSULE 4 TIMES DAILY BEFORE MEALS AND AT BEDTIME. Patient taking differently:  Take 10 mg by mouth in the morning, at noon, in the evening, and at bedtime.  07/20/19  Yes Causey, Charlestine Massed, NP  gabapentin (NEURONTIN) 100 MG capsule 200 mg bid with 300 mg qhs Patient taking differently: Take 200 mg by mouth at bedtime.  03/22/19  Yes Magrinat, Virgie Dad, MD  lactulose (CHRONULAC) 10 GM/15ML solution Take 10 g by mouth daily as needed for moderate constipation.  08/17/19  Yes [provider]  lidocaine (LIDODERM) 5 % Place 1 patch onto the skin daily. Remove & Discard patch within 12 hours or as directed by MD   Yes [provider]  LORazepam (ATIVAN) 0.5 MG tablet TAKE 1 TABLET AT BEDTIME AS NEEDED FOR NAUSEA AND VOMITING. Patient taking  differently: Take 0.5 mg by mouth at bedtime as needed (nausea/vomiting).  08/17/19  Yes Causey, Charlestine Massed, NP  metoCLOPramide (REGLAN) 5 MG tablet Take 2 tablets (10 mg total) by mouth every 8 (eight) hours as needed for nausea. 05/15/19  Yes Magrinat, Virgie Dad, MD  omeprazole (PRILOSEC) 40 MG capsule Take 1 capsule (40 mg total) by mouth daily. 05/22/18  Yes Magrinat, Virgie Dad, MD  sertraline (ZOLOFT) 50 MG tablet TAKE 1 & 1/2 TABLETS ONCE DAILY. Patient taking differently: Take 75 mg by mouth daily.  08/02/19  Yes Magrinat, Virgie Dad, MD  traMADol (ULTRAM) 50 MG tablet TAKE ONE TABLET EVERY 6 HOURS AS NEEDED. Patient taking differently: Take 50 mg by mouth every 6 (six) hours as needed for moderate pain or severe pain.  07/20/19  Yes Magrinat, Virgie Dad, MD  vitamin B-12 (CYANOCOBALAMIN) 1000 MCG tablet Take 1 tablet by mouth daily.   Yes [provider]  dexamethasone (DECADRON) 4 MG tablet Take 2 tablets by mouth once a day for 3 days after chemo. Take with food. Patient not taking: Reported on 08/30/2019 05/04/19   Magrinat, Virgie Dad, MD  diphenhydrAMINE (BENADRYL) 50 MG capsule 50 mg Diphenhydramine (Benadryl) PO within 1 hour of the injection 11/28/17 11/28/17  Causey, Charlestine Massed, NP  fluticasone (FLONASE) 50  MCG/ACT nasal spray Place 1 spray into both nostrils 2 (two) times daily. Patient not taking: Reported on 08/30/2019 08/16/17   Harle Stanford., PA-C  ibuprofen (ADVIL) 600 MG tablet Take 1 tablet by mouth 4 (four) times daily as needed. Patient not taking: Reported on 08/30/2019 11/16/18   [provider]  lidocaine-prilocaine (EMLA) cream APPLY TOPICALLY AS NEEDED. Patient not taking: Reported on 08/30/2019 03/29/18   Magrinat, Virgie Dad, MD  loratadine (CLARITIN) 10 MG tablet Take 10 mg by mouth as needed.  Patient not taking: Reported on 08/30/2019    [provider]  Magnesium (V-R MAGNESIUM) 250 MG TABS Take by mouth. Patient not taking: Reported on 08/30/2019    [provider]  magnesium oxide (MAG-OX) 400 (241.3 Mg) MG tablet Take 1 tablet (400 mg total) by mouth daily. Patient not taking: Reported on 08/30/2019 02/17/18   Magrinat, Virgie Dad, MD  Multiple Vitamins-Minerals (MULTIVITAMIN ADULT PO) Take 1 tablet by mouth daily. Patient not taking: Reported on 08/30/2019    [provider]  Omega 3 1000 MG CAPS Take 1 capsule by mouth daily. Patient not taking: Reported on 08/30/2019    [provider]  prochlorperazine (COMPAZINE) 5 MG tablet Take 1 tablet (5 mg total) by mouth every 8 (eight) hours as needed for nausea or vomiting. Patient not taking: Reported on 08/30/2019 06/07/19   Magrinat, Virgie Dad, MD  promethazine (PHENERGAN) 25 MG suppository Place 1 suppository (25 mg total) rectally every 6 (six) hours as needed for nausea or vomiting. Patient not taking: Reported on 08/30/2019 05/24/19   Magrinat, Virgie Dad, MD  scopolamine (TRANSDERM-SCOP) 1 MG/3DAYS APPLY 1 PATCH EVERY 3 DAYS AS NEEDED FOR NAUSEA. Patient not taking: Reported on 08/30/2019 07/13/19   Magrinat, Virgie Dad, MD  scopolamine (TRANSDERM-SCOP) 1 MG/3DAYS Place 1 patch (1.5 mg total) onto the skin every 3 (three) days. Patient not taking: Reported on 08/30/2019 07/03/19   Magrinat, Virgie Dad, MD    senna-docusate (SENOKOT-S) 8.6-50 MG tablet Take 1-2 tablets by mouth 2 (two) times daily as needed. Patient not taking: Reported on 08/30/2019 11/21/18   [provider]  valACYclovir (VALTREX) 1000 MG tablet Take 1 tablet (  1,000 mg total) by mouth daily. Patient not taking: Reported on 08/30/2019 05/22/18   Magrinat, Virgie Dad, MD  VENTOLIN HFA 108 [phone] Base) MCG/ACT inhaler  01/19/19   [provider]    Allergies    Bee venom, Prochlorperazine edisylate, Sulfa antibiotics, Phenergan [promethazine hcl], and Zarxio [filgrastim]  Review of Systems   Review of Systems  Constitutional: Positive for fatigue. Negative for chills and fever.  Respiratory: Negative for cough and shortness of breath.   Cardiovascular: Negative for chest pain.  Gastrointestinal: Positive for abdominal pain and vomiting. Negative for blood in stool and diarrhea.  Genitourinary: Negative for dysuria, flank pain, frequency and hematuria.  Neurological: Positive for weakness.  All other systems reviewed and are negative.   Physical Exam Updated Vital Signs BP 90/70 (BP Location: Right Arm)   Pulse (!) 109   Temp 97.9 F (36.6 C) (Oral)   Resp 15   SpO2 96%   Physical Exam Vitals and nursing note reviewed.  Constitutional:      General: She is not in acute distress.    Appearance: She is well-developed. She is cachectic. She is ill-appearing (chronically). She is not diaphoretic.  HENT:     Head: Normocephalic and atraumatic.     Mouth/Throat:     Mouth: Mucous membranes are moist.     Pharynx: Oropharynx is clear.  Eyes:     Conjunctiva/sclera: Conjunctivae normal.  Cardiovascular:     Rate and Rhythm: Normal rate and regular rhythm.     Pulses: Normal pulses.          Radial pulses are 2+ on the right side and 2+ on the left side.       Posterior tibial pulses are 2+ on the right side and 2+ on the left side.     Heart sounds: Normal heart sounds.     Comments: Tactile temperature in  the extremities appropriate and equal bilaterally. Not tachycardic upon my exam. Pulmonary:     Effort: Pulmonary effort is normal. No respiratory distress.     Breath sounds: Normal breath sounds.  Abdominal:     General: There is distension.     Palpations: Abdomen is soft.     Tenderness: There is abdominal tenderness. There is no guarding.    Musculoskeletal:     Cervical back: Neck supple.     Right lower leg: No edema.     Left lower leg: No edema.  Lymphadenopathy:     Cervical: No cervical adenopathy.  Skin:    General: Skin is warm and dry.  Neurological:     Mental Status: She is alert.  Psychiatric:        Mood and Affect: Mood and affect normal.        Speech: Speech normal.        Behavior: Behavior normal.     ED Results / Procedures / Treatments   Labs (all labs ordered are listed, but only abnormal results are displayed) Labs Reviewed  COMPREHENSIVE METABOLIC PANEL - Abnormal; Notable for the following components:      Result Value   Sodium 131 (*)    Potassium 3.0 (*)    Chloride 70 (*)    CO2 40 (*)    Glucose, Bld 114 (*)    BUN 43 (*)    Creatinine, Ser 1.35 (*)    Total Protein 8.2 (*)    Alkaline Phosphatase 153 (*)    GFR calc non Af Amer 44 (*)  GFR calc Af Amer 51 (*)    Anion gap 21 (*)    All other components within normal limits  CBC WITH DIFFERENTIAL/PLATELET - Abnormal; Notable for the following components:   RBC 2.73 (*)    Hemoglobin 8.9 (*)    HCT 26.6 (*)    RDW 23.1 (*)    All other components within normal limits  URINALYSIS, ROUTINE W REFLEX MICROSCOPIC - Abnormal; Notable for the following components:   APPearance HAZY (*)    Specific Gravity, Urine 1.041 (*)    Ketones, ur 20 (*)    Protein, ur 30 (*)    Leukocytes,Ua SMALL (*)    Bacteria, UA RARE (*)    Non Squamous Epithelial 0-5 (*)    All other components within normal limits  I-STAT CHEM 8, ED - Abnormal; Notable for the following components:   Sodium 127  (*)    Potassium 2.9 (*)    Chloride 72 (*)    BUN 41 (*)    Creatinine, Ser 1.50 (*)    Glucose, Bld 113 (*)    Calcium, Ion 1.01 (*)    TCO2 44 (*)    Hemoglobin 9.5 (*)    HCT 28.0 (*)    All other components within normal limits  SARS CORONAVIRUS 2 BY RT PCR (HOSPITAL ORDER, [facility])  LIPASE, BLOOD  MAGNESIUM  HIV ANTIBODY (ROUTINE TESTING W REFLEX)  BASIC METABOLIC PANEL  CBC  HEPATIC FUNCTION PANEL    Hemoglobin  Date Value Ref Range Status  08/30/2019 9.5 (L) 12.0 - 15.0 g/dL Final  08/30/2019 8.9 (L) 12.0 - 15.0 g/dL Final  08/27/2019 8.2 (L) 12.0 - 15.0 g/dL Final  08/02/2019 7.5 (L) 12.0 - 15.0 g/dL Final  07/19/2019 8.5 (L) 12.0 - 15.0 g/dL Final  06/27/2019 8.6 (L) 12.0 - 15.0 g/dL Final  06/19/2019 9.6 (L) 12.0 - 15.0 g/dL Final  05/15/2019 9.5 (L) 12.0 - 15.0 g/dL Final   HGB  Date Value Ref Range Status  02/11/2017 12.1 11.6 - 15.9 g/dL Final  02/04/2017 11.6 11.6 - 15.9 g/dL Final  01/19/2017 12.2 11.6 - 15.9 g/dL Final  01/05/2017 11.8 11.6 - 15.9 g/dL Final   BUN  Date Value Ref Range Status  08/30/2019 41 (H) 6 - 20 mg/dL Final  08/30/2019 43 (H) 6 - 20 mg/dL Final  08/27/2019 27 (H) 6 - 20 mg/dL Final  08/02/2019 15 6 - 20 mg/dL Final  02/11/2017 7.9 7.0 - 26.0 mg/dL Final  02/04/2017 9.7 7.0 - 26.0 mg/dL Final  12/08/2016 9.8 7.0 - 26.0 mg/dL Final  11/01/2016 14.8 7.0 - 26.0 mg/dL Final   Creatinine  Date Value Ref Range Status  08/27/2019 1.02 (H) 0.44 - 1.00 mg/dL Final  06/19/2019 0.78 0.44 - 1.00 mg/dL Final  06/07/2019 0.79 0.44 - 1.00 mg/dL Final  05/15/2019 0.88 0.44 - 1.00 mg/dL Final  02/11/2017 0.7 0.6 - 1.1 mg/dL Final  02/04/2017 0.7 0.6 - 1.1 mg/dL Final  12/08/2016 0.8 0.6 - 1.1 mg/dL Final  11/01/2016 0.8 0.6 - 1.1 mg/dL Final   Creatinine, Ser  Date Value Ref Range Status  08/30/2019 1.50 (H) 0.44 - 1.00 mg/dL Final  08/30/2019 1.35 (H) 0.44 - 1.00 mg/dL Final  08/02/2019 0.81 0.44 -  1.00 mg/dL Final  07/19/2019 0.97 0.44 - 1.00 mg/dL Final     EKG EKG Interpretation  Date/Time:  Thursday August 30 2019 18:40:46 EDT Ventricular Rate:  97 PR Interval:    QRS  Duration: 80 QT Interval:  342 QTC Calculation: 435 R Axis:   79 Text Interpretation: Sinus tachycardia Ventricular premature complex Borderline short PR interval LVH with secondary repolarization abnormality Baseline wander in lead(s) I Confirmed by Sherwood Gambler [phone]) on 08/30/2019 6:50:17 PM   Radiology CT ABDOMEN PELVIS W CONTRAST  Result Date: 08/30/2019 CLINICAL DATA:  Abdominal pain, bilious vomiting, ovarian cancer EXAM: CT ABDOMEN AND PELVIS WITH CONTRAST TECHNIQUE: Multidetector CT imaging of the abdomen and pelvis was performed using the standard protocol following bolus administration of intravenous contrast. CONTRAST:  89mL OMNIPAQUE IOHEXOL 300 MG/ML  SOLN COMPARISON:  07/30/2019 FINDINGS: Lower chest: Moderate left pleural effusion is again identified with compressive atelectasis of the left lung base. Visualized right lung base is clear. Central venous catheter tip noted within the superior cavoatrial junction. Cardiac size within normal limits. Hepatobiliary: Cholecystectomy has been performed. Stable hemangioma within the a inferior right hepatic lobe. The liver is otherwise unremarkable. No intra or extrahepatic biliary ductal dilation identified. Pancreas: Unremarkable Spleen: Unremarkable Adrenals/Urinary Tract: The adrenal glands are unremarkable. Mass effect upon the right kidney by the distended duodenum is again identified, however, the kidneys are otherwise unremarkable. There is normal symmetric renal cortical enhancement. The bladder appears circumferentially mildly thick walled and demonstrates moderate perivesicular inflammatory stranding, similar to prior examination, possibly reflecting a chronic inflammatory process such as a chronic infection or radiation cystitis in the appropriate  clinical setting. The bladder is not distended. Stomach/Bowel: There is massive distension of the a first and second portion of the duodenum to the level of the a point at which it crosses the midline. In this region, however, there is reasonable space between the abdominal aorta and superior mesenteric artery making SMA syndrome unlikely as a cause of obstruction. There is, however, numerous thick walled loops of small bowel again identified clustered within the mid abdomen. There is moderate ascites contain within thickened peritoneum, similar to that noted on prior examination. 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 No focal point of transition is identified, though the caliber of bowel does improve once the ileum exits the encapsulating peritoneum, best seen on axial image # 60/2. No free intraperitoneal gas. The large bowel is unremarkable. Vascular/Lymphatic: No pathologic adenopathy seen within the abdomen and pelvis. The inferior vena cava is compressed, progressive since prior examination, likely a combination of the encapsulating process as well as some degree of hypovolemia. The arterial vasculature is unremarkable. Reproductive: Uterus absent.  No adnexal masses. Other: There is diffuse body wall wasting noted, progressive since prior examination. Musculoskeletal: The osseous structures are unremarkable. IMPRESSION: Diffuse bowel wall thickening and central clumping within encapsulating thickened peritoneum and moderate ascites. 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 This is most commonly seen in the setting of peritoneal dialysis or peritoneal carcinomatosis. Massive distention of the duodenum is progressive since prior examination. Stable chronic bladder wall thickening and perivesicular inflammatory stranding, possibly reflecting changes of a radiation  cystitis in the appropriate clinical setting. Progressive body wall wasting. Electronically Signed   By: Fidela Salisbury MD   On: 08/30/2019 20:35    Procedures Procedures (including critical care time)  Medications Ordered in ED Medications  lidocaine (LIDODERM) 5 % 1 patch (1 patch Transdermal Patch Applied 08/30/19 2249)  ondansetron (ZOFRAN) tablet 4 mg (has no administration in time range)    Or  ondansetron (ZOFRAN) injection 4 mg (has no administration in time  range)  0.9 % NaCl with KCl 20 mEq/ L  infusion ( Intravenous New Bag/Given 08/30/19 2235)  albuterol (PROVENTIL) (2.5 MG/3ML) 0.083% nebulizer solution 2.5 mg (has no administration in time range)  metroNIDAZOLE (FLAGYL) IVPB 500 mg (has no administration in time range)  gabapentin (NEURONTIN) capsule 200 mg (200 mg Oral Given 08/30/19 2249)  ciprofloxacin (CIPRO) IVPB 400 mg (400 mg Intravenous New Bag/Given 08/30/19 2251)  lactated ringers bolus 1,000 mL (1,000 mLs Intravenous New Bag/Given 08/30/19 1901)  iohexol (OMNIPAQUE) 300 MG/ML solution 75 mL (70 mLs Intravenous Contrast Given 08/30/19 1953)  potassium chloride 10 mEq in 100 mL IVPB (10 mEq Intravenous New Bag/Given 08/30/19 2129)    ED Course  I have reviewed the triage vital signs and the nursing notes.  Pertinent labs & imaging results that were available during my care of the patient were reviewed by me and considered in my medical decision making (see chart for details).  Clinical Course as of Aug 29 2321  Thu Aug 30, 2019  1739 Declines antiemetic at this time.    [SJ]  3267 Spoke with Dr. Lindi Adie, oncology. Made him aware of the patient's presence here in the ED.  Agrees with direction of work-up.  CT abdomen if concern for obstruction.   [SJ]  1245 Consistent with previous blood pressures.  BP: 90/70 [SJ]  2109 Spoke with Dr. Hal Hope, hospitalist.  Requests we speak with both GI and oncology to assure nothing more needs to be done from their point of  view tonight.   [SJ]  2113 Spoke with Dr. Lindi Adie, oncology.  He advises to continue IV fluids. Add antibiotics for finding of bowel inflammation, such as Cipro and Flagyl. Asks that he and Dr. Jana Hakim be added to the patient's treatment team. They will see the patient and have a detailed conversation with her regarding the CT imaging results and next steps from an oncology point of view.  He asks that the oncology team be the ones to have the discussion with the patient.   [SJ]  2242 Spoke with Dr. Hilarie Fredrickson, [facility].  If patient vomits or if she wants some relief from distension, can place NG tube. Aggressive IV fluids. If she has an NG tube, she can have ice chips.  Otherwise n.p.o. Avoid anything that promotes motility, such as Reglan. Their team will see the patient in the morning.    [SJ]  2250 Discussed NG tube with the patient.  Explained the purpose and advantages.  I also went through the process of the procedure.  She agreed to the NG tube. Patient also asks whether or not she can take her gabapentin for her neuropathy.  I told her I would talk with the pharmacist for other routes of this medication or for similar medications since she will have a NG tube placed.   [SJ]  2256 Updated Dr. Hal Hope via phone about Dr. Vena Rua recommendations.    [SJ]  2310 Spoke with JC, pharmacist. Discussed gabapentin. He states there are not acceptable alternative routes for this medication. He will research alternative medications that can be used for chemotherapy-induced neuropathy.   [SJ]    Clinical Course User Index [SJ] Damarious Holtsclaw, Helane Gunther, PA-C   MDM Rules/Calculators/A&P                          Patient presents with vomiting over the last several days. Patient is nontoxic appearing, afebrile, not tachycardic on my exam, not tachypneic, not  hypotensive, maintains excellent SPO2 on room air, and is in no apparent distress.   I have reviewed the patient's chart to obtain more  information.   I reviewed and interpreted the patient's labs and radiological studies. No leukocytosis. Mild hypokalemia, addressed. Evidence of dehydration with increase in BUN and creatinine as well as decreasing sodium and chloride, elevation in CO2.  Distention in the duodenum on CT.  NG tube will be placed. Patient admitted with oncology and gastroenterology consulting.  At the beginning of the patient's presentation to the ED, Dr. Silverio Decamp called in and spoke with EDP, Dr. Johnney Killian, and requested abdominal imaging, IV fluids, and admission.  There is a plan for endoscopy and duodenal stent tomorrow.   Findings and plan of care discussed with Sherwood Gambler, MD.     Vitals:   08/30/19 1609 08/30/19 1844 08/30/19 1937  BP: 90/70 104/77 106/79  Pulse: (!) 109 95 82  Resp: 15 16 16   Temp: 97.9 F (36.6 C)    TempSrc: Oral    SpO2: 96% 98% 98%      Final Clinical Impression(s) / ED Diagnoses Final diagnoses:  Left upper quadrant abdominal pain  Non-intractable vomiting with nausea, unspecified vomiting type    Rx / DC Orders ED Discharge Orders    None       Layla Maw 08/30/19 2328    Sherwood Gambler, MD 08/31/19 [phone]

## 2019-08-30 NOTE — ED Triage Notes (Signed)
Patient reports she has been having emesis, weakness, near syncope, and is supposed to have a COVID swab, labs, x-ray and fluids done today before she has endoscopy tomorrow.

## 2019-08-30 NOTE — Progress Notes (Signed)
I received a call this morning from Dr. Collene Mares regarding Kathryn Ramsey.  She tells me Kathryn Ramsey has been vomiting for more than 2 days.  She tells me she obtained a film which shows evidence of bowel obstruction.  The most recent film we got from 08/27/2019 did not show obstruction.  If Kathryn Ramsey does have bowel obstruction she needs to be in the emergency room for NG placement and then further evaluation and treatment if possible.  I have left her a message on her phone saying that and also left a message on her husband Kathryn Ramsey phone at 9:01 AM.

## 2019-08-30 NOTE — Progress Notes (Signed)
A consult was received from an ED physician for cipro per pharmacy dosing.  The patient's profile has been reviewed for ht/wt/allergies/indication/available labs.   A one time order has been placed for cipro 400mg .  Further antibiotics/pharmacy consults should be ordered by admitting physician if indicated.                       Thank you, Peggyann Juba, PharmD, BCPS Pharmacy: [phone] 08/30/2019  9:25 PM

## 2019-08-31 ENCOUNTER — Inpatient Hospital Stay: Payer: 59 | Admitting: Oncology

## 2019-08-31 ENCOUNTER — Inpatient Hospital Stay: Payer: 59

## 2019-08-31 DIAGNOSIS — C5701 Malignant neoplasm of right fallopian tube: Secondary | ICD-10-CM | POA: Diagnosis not present

## 2019-08-31 DIAGNOSIS — D6481 Anemia due to antineoplastic chemotherapy: Secondary | ICD-10-CM

## 2019-08-31 DIAGNOSIS — C762 Malignant neoplasm of abdomen: Secondary | ICD-10-CM

## 2019-08-31 DIAGNOSIS — R112 Nausea with vomiting, unspecified: Secondary | ICD-10-CM

## 2019-08-31 DIAGNOSIS — T451X5A Adverse effect of antineoplastic and immunosuppressive drugs, initial encounter: Secondary | ICD-10-CM

## 2019-08-31 LAB — BASIC METABOLIC PANEL
Anion gap: 16 — ABNORMAL HIGH (ref 5–15)
BUN: 38 mg/dL — ABNORMAL HIGH (ref 6–20)
CO2: 34 mmol/L — ABNORMAL HIGH (ref 22–32)
Calcium: 8.4 mg/dL — ABNORMAL LOW (ref 8.9–10.3)
Chloride: 78 mmol/L — ABNORMAL LOW (ref 98–111)
Creatinine, Ser: 1.27 mg/dL — ABNORMAL HIGH (ref 0.44–1.00)
GFR calc Af Amer: 55 mL/min — ABNORMAL LOW (ref >60)
GFR calc non Af Amer: 47 mL/min — ABNORMAL LOW (ref >60)
Glucose, Bld: 92 mg/dL (ref 70–99)
Potassium: 3.1 mmol/L — ABNORMAL LOW (ref 3.5–5.1)
Sodium: 128 mmol/L — ABNORMAL LOW (ref 135–145)

## 2019-08-31 LAB — CBG MONITORING, ED: Glucose-Capillary: 89 mg/dL (ref 70–99)

## 2019-08-31 LAB — CBC
HCT: 21.5 % — ABNORMAL LOW (ref 36.0–46.0)
Hemoglobin: 7 g/dL — ABNORMAL LOW (ref 12.0–15.0)
MCH: 32.3 pg (ref 26.0–34.0)
MCHC: 32.6 g/dL (ref 30.0–36.0)
MCV: 99.1 fL (ref 80.0–100.0)
Platelets: 140 10*3/uL — ABNORMAL LOW (ref 150–400)
RBC: 2.17 MIL/uL — ABNORMAL LOW (ref 3.87–5.11)
RDW: 23.2 % — ABNORMAL HIGH (ref 11.5–15.5)
WBC: 3.3 10*3/uL — ABNORMAL LOW (ref 4.0–10.5)
nRBC: 0 % (ref 0.0–0.2)

## 2019-08-31 LAB — HEPATIC FUNCTION PANEL
ALT: 17 U/L (ref 0–44)
AST: 16 U/L (ref 15–41)
Albumin: 3.2 g/dL — ABNORMAL LOW (ref 3.5–5.0)
Alkaline Phosphatase: 118 U/L (ref 38–126)
Bilirubin, Direct: 0.1 mg/dL (ref 0.0–0.2)
Indirect Bilirubin: 1.1 mg/dL — ABNORMAL HIGH (ref 0.3–0.9)
Total Bilirubin: 1.2 mg/dL (ref 0.3–1.2)
Total Protein: 6.6 g/dL (ref 6.5–8.1)

## 2019-08-31 LAB — GLUCOSE, CAPILLARY
Glucose-Capillary: 69 mg/dL — ABNORMAL LOW (ref 70–99)
Glucose-Capillary: 90 mg/dL (ref 70–99)

## 2019-08-31 LAB — PROCALCITONIN: Procalcitonin: 0.11 ng/mL

## 2019-08-31 LAB — HIV ANTIBODY (ROUTINE TESTING W REFLEX): HIV Screen 4th Generation wRfx: NONREACTIVE

## 2019-08-31 MED ORDER — KCL IN DEXTROSE-NACL 20-5-0.45 MEQ/L-%-% IV SOLN
INTRAVENOUS | Status: DC
  Filled 2019-08-31 (×9): qty 1000

## 2019-08-31 MED ORDER — SERTRALINE HCL 20 MG/ML PO CONC
75.0000 mg | Freq: Every day | ORAL | Status: DC
  Administered 2019-08-31: 75 mg via ORAL
  Filled 2019-08-31 (×2): qty 3.75

## 2019-08-31 MED ORDER — PANTOPRAZOLE SODIUM 40 MG IV SOLR
40.0000 mg | Freq: Once | INTRAVENOUS | Status: AC
  Administered 2019-08-31: 40 mg via INTRAVENOUS
  Filled 2019-08-31: qty 40

## 2019-08-31 MED ORDER — CIPROFLOXACIN IN D5W 400 MG/200ML IV SOLN: 400.0000 mg | INTRAVENOUS | Status: DC

## 2019-08-31 MED ORDER — CHLORHEXIDINE GLUCONATE CLOTH 2 % EX PADS
6.0000 | MEDICATED_PAD | Freq: Every day | CUTANEOUS | Status: DC
  Administered 2019-08-31 – 2019-09-04 (×5): 6 via TOPICAL

## 2019-08-31 MED ORDER — GABAPENTIN 250 MG/5ML PO SOLN
200.0000 mg | Freq: Two times a day (BID) | ORAL | Status: DC
  Filled 2019-08-31 (×3): qty 4

## 2019-08-31 MED ORDER — TRAZODONE HCL 50 MG PO TABS
50.0000 mg | ORAL_TABLET | Freq: Once | ORAL | Status: AC
  Administered 2019-08-31: 50 mg via ORAL
  Filled 2019-08-31: qty 1

## 2019-08-31 NOTE — ED Notes (Signed)
Attempted NG tube, pt unable to tolerate, Dr Hal Hope aware and states will try with radiology in the morning

## 2019-08-31 NOTE — Progress Notes (Signed)
Kathryn Ramsey   DOB:April 13, 1965   UY#:[id number]   DGL#:[id number]  Subjective:  Kathryn Ramsey has had progressive weakness and weight loss despite or partly because of her chemotherapy-- last treatment 08/02/2019. In the last 2-3 days she has had uncontrolled vomiting, she was evaluated by Dr Collene Mares, and was directed to the ED for possible ng placement. CT shows what seems to me obstruction in the 2d duodenum but also diffuse carcinomatiosis as previously noted. Several attempts at ng placement unsuccessful-- she is hoping IR may place the ng or GI may succeed in stenting the obstruction. See discussion below   Objective: white woman examined in bed Vitals:   08/31/19 0732 08/31/19 0802  BP: 91/67 92/64  Pulse: 72 77  Resp: 10 12  Temp:    SpO2: 98% 99%    There is no height or weight on file to calculate BMI.  Intake/Output Summary (Last 24 hours) at 08/31/2019 0813 Last data filed at 08/31/2019 0648 Gross per 24 hour  Intake 1749.75 ml  Output 200 ml  Net 1549.75 ml     Lungs no rales or wheezes--auscultated anterolaterally  Heart regular rate and rhythm  Abdomen soft, mildly distended  Neuro nonfocal, A&O x3, very alert and able to make decisions  Breast exam: deferred  CBG (last 3)  Recent Labs    08/31/19 0744  GLUCAP 89     Labs:  Lab Results  Component Value Date   WBC 3.3 (L) 08/31/2019   HGB 7.0 (L) 08/31/2019   HCT 21.5 (L) 08/31/2019   MCV 99.1 08/31/2019   PLT 140 (L) 08/31/2019   NEUTROABS 3.2 08/30/2019    _0 @  Urine Studies No results for input(s): UHGB, CRYS in the last 72 hours.  Invalid input(s): UACOL, UAPR, USPG, UPH, UTP, UGL, UKET, UBIL, UNIT, UROB, [place], UEPI, UWBC, [place], [place], [place], [place], [place]  Basic Metabolic Panel: Recent Labs  Lab 08/27/19 1125 08/27/19 1125 08/30/19 1904 08/30/19 1904 08/30/19 1917 08/31/19 0545  NA 133*  --  131*  --  127* 128*  K 3.2*   < > 3.0*   < > 2.9* 3.1*  CL 87*  --  70*  --  72* 78*  CO2 34*  --  40*   --   --  34*  GLUCOSE 108*  --  114*  --  113* 92  BUN 27*  --  43*  --  41* 38*  CREATININE 1.02*  --  1.35*  --  1.50* 1.27*  CALCIUM 9.3  --  9.6  --   --  8.4*  MG 1.3*  --  2.4  --   --   --    < > = values in this interval not displayed.   GFR Estimated Creatinine Clearance: 28.9 mL/min (A) (by C-G formula based on SCr of 1.27 mg/dL (H)). Liver Function Tests: Recent Labs  Lab 08/27/19 1125 08/30/19 1904 08/31/19 0545  AST _1 ALT _2 ALKPHOS 133* 153* 118  BILITOT 0.6 1.0 1.2  PROT 7.5 8.2* 6.6  ALBUMIN 3.3* 4.1 3.2*   Recent Labs  Lab 08/30/19 1904  LIPASE 31   No results for input(s): AMMONIA in the last 168 hours. Coagulation profile No results for input(s): INR, PROTIME in the last 168 hours.  CBC: Recent Labs  Lab 08/27/19 1125 08/30/19 1904 08/30/19 1917 08/31/19 0545  WBC 2.8* 4.6  --  3.3*  NEUTROABS 1.8 3.2  --   --   HGB  8.2* 8.9* 9.5* 7.0*  HCT 24.9* 26.6* 28.0* 21.5*  MCV 97.6 97.4  --  99.1  PLT 80* 173  --  140*   Cardiac Enzymes: No results for input(s): CKTOTAL, CKMB, CKMBINDEX, TROPONINI in the last 168 hours. BNP: Invalid input(s): POCBNP CBG: Recent Labs  Lab 08/31/19 0744  GLUCAP 89   D-Dimer No results for input(s): DDIMER in the last 72 hours. Hgb A1c No results for input(s): HGBA1C in the last 72 hours. Lipid Profile No results for input(s): CHOL, HDL, LDLCALC, TRIG, CHOLHDL, LDLDIRECT in the last 72 hours. Thyroid function studies No results for input(s): TSH, T4TOTAL, T3FREE, THYROIDAB in the last 72 hours.  Invalid input(s): FREET3 Anemia work up No results for input(s): VITAMINB12, FOLATE, FERRITIN, TIBC, IRON, RETICCTPCT in the last 72 hours. Microbiology Recent Results (from the past 240 hour(s))  SARS Coronavirus 2 by RT PCR (hospital order, performed in [facility] hospital lab) Nasopharyngeal Nasopharyngeal Swab     Status: None   Collection Time: 08/30/19  7:04 PM   Specimen: Nasopharyngeal  Swab  Result Value Ref Range Status   SARS Coronavirus 2 NEGATIVE NEGATIVE Final    Comment: (NOTE) SARS-CoV-2 target nucleic acids are NOT DETECTED.  The SARS-CoV-2 RNA is generally detectable in upper and lower respiratory specimens during the acute phase of infection. The lowest concentration of SARS-CoV-2 viral copies this assay can detect is 250 copies / mL. A negative result does not preclude SARS-CoV-2 infection and should not be used as the sole basis for treatment or other patient management decisions.  A negative result may occur with improper specimen collection / handling, submission of specimen other than nasopharyngeal swab, presence of viral mutation(s) within the areas targeted by this assay, and inadequate number of viral copies (<250 copies / mL). A negative result must be combined with clinical observations, patient history, and epidemiological information.  Fact Sheet for Patients:   StrictlyIdeas.no  Fact Sheet for Healthcare Providers: BankingDealers.co.za  This test is not yet approved or  cleared by the [place] FDA and has been authorized for detection and/or diagnosis of SARS-CoV-2 by FDA under an Emergency Use Authorization (EUA).  This EUA will remain in effect (meaning this test can be used) for the duration of the COVID-19 declaration under Section 564(b)(1) of the Act, 21 U.S.C. section 360bbb-3(b)(1), unless the authorization is terminated or revoked sooner.  Performed at [facility], [place] [address] [place], [place] [address]       Studies:  CT ABDOMEN PELVIS W CONTRAST  Result Date: 08/30/2019 CLINICAL DATA:  Abdominal pain, bilious vomiting, ovarian cancer EXAM: CT ABDOMEN AND PELVIS WITH CONTRAST TECHNIQUE: Multidetector CT imaging of the abdomen and pelvis was performed using the standard protocol following bolus administration of intravenous contrast. CONTRAST:  74m  OMNIPAQUE IOHEXOL 300 MG/ML  SOLN COMPARISON:  07/30/2019 FINDINGS: Lower chest: Moderate left pleural effusion is again identified with compressive atelectasis of the left lung base. Visualized right lung base is clear. Central venous catheter tip noted within the superior cavoatrial junction. Cardiac size within normal limits. Hepatobiliary: Cholecystectomy has been performed. Stable hemangioma within the a inferior right hepatic lobe. The liver is otherwise unremarkable. No intra or extrahepatic biliary ductal dilation identified. Pancreas: Unremarkable Spleen: Unremarkable Adrenals/Urinary Tract: The adrenal glands are unremarkable. Mass effect upon the right kidney by the distended duodenum is again identified, however, the kidneys are otherwise unremarkable. There is normal symmetric renal cortical enhancement. The bladder appears circumferentially mildly thick walled and  demonstrates moderate perivesicular inflammatory stranding, similar to prior examination, possibly reflecting a chronic inflammatory process such as a chronic infection or radiation cystitis in the appropriate clinical setting. The bladder is not distended. Stomach/Bowel: There is massive distension of the a first and second portion of the duodenum to the level of the a point at which it crosses the midline. In this region, however, there is reasonable space between the abdominal aorta and superior mesenteric artery making SMA syndrome unlikely as a cause of obstruction. There is, however, numerous thick walled loops of small bowel again identified clustered within the mid abdomen. There is moderate ascites contain within thickened peritoneum, similar to that noted on prior examination. 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 No focal point of transition is identified, though the caliber of bowel does improve once the ileum exits the  encapsulating peritoneum, best seen on axial image # 60/2. No free intraperitoneal gas. The large bowel is unremarkable. Vascular/Lymphatic: No pathologic adenopathy seen within the abdomen and pelvis. The inferior vena cava is compressed, progressive since prior examination, likely a combination of the encapsulating process as well as some degree of hypovolemia. The arterial vasculature is unremarkable. Reproductive: Uterus absent.  No adnexal masses. Other: There is diffuse body wall wasting noted, progressive since prior examination. Musculoskeletal: The osseous structures are unremarkable. IMPRESSION: Diffuse bowel wall thickening and central clumping within encapsulating thickened peritoneum and moderate ascites. 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 This is most commonly seen in the setting of peritoneal dialysis or peritoneal carcinomatosis. Massive distention of the duodenum is progressive since prior examination. Stable chronic bladder wall thickening and perivesicular inflammatory stranding, possibly reflecting changes of a radiation cystitis in the appropriate clinical setting. Progressive body wall wasting. Electronically Signed   By: Fidela Salisbury MD   On: 08/30/2019 20:35    Assessment: 54 y.o. BRCA negative [place] woman admitted 08/30/2019 with bowel obstruction  In setting of progressive peritoneal carcinomatosis  (0)  status post radical tumor debulking with optimal cytoreduction (R0) 03/27/2015 for a stage IIIC, high-grade right fallopian tube carcinoma             (a) baseline CA-125 was 1226.             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)             (c) tumor is PD-L1 negative (02/24/2016)             (d) tumor is strongly estrogen receptor positive,  progesterone receptor negative (02/24/2016)  (1) adjuvant chemotherapy consisted of carboplatin and paclitaxel for 6 doses, begun 04/14/2015, completed 08/11/2015             (a) paclitaxel was omitted from cycle 5 and dose reduced on cycle 6 because of neuropathy              (b) a "make up" dose of paclitaxel was given 08/25/2015             (c) last carboplatin dose was 08/11/2015             (d) CA-125 normalized by 06/16/2015              (2) FIRST RECURRENCE: January 2018             (  a) CA-125 rise beginning November 2017 led to CT scans and PET scans December 2017, all negative             (b) exploratory laparotomy 02/24/2016 showed pathologically confirmed recurrence, with miliary disease involving all examined surfaces             (c) 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             (a) cycle 3 delayed one week because of neutropenia; OnPro added             (b) CA 125 normalized after cycle 3  (4) RUCAPARIB maintenance started 07/02/2016             (a) restaging 10/01/2016: Normal CA 125, negative CT of the abdomen and pelvis             (b) labs 11/24/2016 shows a rise in her CA 125-43.4, with continuing rise thereafter             (c) rucaparib discontinued October 2018 with rising CA 125  (5) anastrozole started December 23, 2016-stopped after a couple of weeks with poor tolerance  SECOND RECURRENCE: (6) Gemcitabine/Bevacizumab started on 02/04/2017             (a) gemcitabine omitted cycle 4 because of intercurrent infection             (b) gemcitabine resumed with cycle 5, with intervening rise in  Ca 125             (c) repeat CT scan of the abdomen and pelvis on 05/26/2017 does not confirm  obvious disease progression             (d) cycle 5 of gemcitabine/bevacizumab delayed 1 week              (e) gemcitabine/ bevacizumab discontinued after 6 cycles, with rising CA 125 (last dose 06/24/2017)  (7) foundation one testing did not show a high mutation burden and the microsatellite status was stable.  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  These were not immediately actionable  (8) Abraxane started 07/12/2017, given weekly or weeks 1 and 2 of each 3 weeks cycle, last dose 11/15/2017 (5 cycles)             (a) cycle 2 started 08/09/2017, will be day 1 day 8 only             (b) cycle 3 scheduled to start 09/13/2017, day 1 day 8 at patient request             (c) CT of the abdomen and pelvis 11/28/2017 shows no measurable disease             (d) PET scan 01/25/2018 and MRI of the pelvis 02/03/2018 show no measurable disease             (e) patient has symptomatic ascites and a rapidly rising Ca1 25  (9) started cisplatin/gemcitabine days 1 and 8 of each 21-day cycle on 02/18/2018             (a) switched to carboplatin/ gemcitabine 07/17/2018 due to neuropathy             (  b) carbo/gemzar changed to Q14 days 07/24/2018 due to neutropenia             (c) considered niraparib/Zejula 100 mg, 2 tabs daily at the completion of chemotherapy  (10) associated problems             (a) anemia due to chemotherapy: On Aranesp             (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 10/25/18 Consenting for AMV-564 clinical trial 21. 11/13/18 AMV-564 initiation 75 mcg dose 22. 10/8 Dropped down to 50 mcg dose (I think that date is correct) 23. 10/19-21/20 Tx held due to Neutropenia 24. 12/06/18 Given Neulasta 25. 12/07/18 AMV-564 resumed 26. 01/03/19 CT C/A/P 10% RECIST progression 27. 01/15/19 Increased dose to 75 mcg/daily 28. 01/17/19  Thoracentesis with cytology c/w [place] primary 29. Paracentesis 02/05/2019: cytology c/w adenocarcinoma  (12) schedule of thoracenteses:             (a) left 01/17/2019 [facility])             (b) left 02/06/2019 [facility])             (c) 05/21/2019 on the left [facility])  (13) schedule of paracenteses:             (a) 01/30/2019 ([place])             (b) 02/06/2019 ([place])             (c) 02/18/2018 ([place])             (d) 04/17/2019 ([place])             (e) additional procedures 04/30/2019, 05/24/2019  (14) abraxane started 02/20/2019, repeated days 1 and 8 of every 21 day cycle             (a) bevacizumab/Avastin started 02/20/2019 and repeated every 21 days             (b) She cannot tolerate Zarxio due to side effects consisting of low grade fever, body aches, malaise, fatigue, and itching, whereas she has been able to tolerate Granix though             (c) bevacizumab and Abraxane discontinued after 04/10/2019 dose with evidence of progression  (15) exemestane started 04/24/2019, discontinued after 2 weeks as patient opted for chemotherapy  (16) started liposomal doxorubicin/carboplatin 05/10/2019, repeated every 28 days             (a) echocardiogram 05/08/2017 shows an ejection fraction in the 55-60% range.             (b) patient is interested in [id number] if available through the "right to try" program             (c) HER-2 determination obtained on prior pathology 06/04/2019--negative results             (d) considering [facility] , [facility] or [place] study  (e) doxil/carbo discontinued after 06/17/2021dose with evidence of progression    Plan:  I had a long conversation with Kiffany and her husband Tom this AM. They understand she has an SBO which needs to be relieved. Hopefully she can have an ng placed for symptom control. She is hoping stenting through the main area of obstruction may be possible. If not possible, then she would need a PEG tube so she can go home and she would  be interested in  a jejunostomy tube for nutrition if that was felt to be feasible.  We reviewed advanced directives and she does have a living will in place. At this point however she would want "everything done," including CPR and intubation if necessary. If stenting or jejunostomy are not possible she understands we are dealing with a terminal stage of her disease and we would then reconsider. She and her husband are actively discussing all these options.  Will follow with you. I greatly appreciate your help to this patient and her family.   Chauncey Cruel, MD 08/31/2019  8:13 AM Medical Oncology and Hematology [facility] [address] [place], [place] [address] Tel. [phone]    Fax. [phone]

## 2019-08-31 NOTE — Consult Note (Signed)
Reason for Consult: Recurrent nausea and vomiting. Referring Physician: Dr. Oran Rein Magrinat. Kathryn Ramsey is an 54 y.o. female.  HPI: Kathryn Ramsey is a 54 year old pleasant white female with multiple problems listed below, who has been followed by Dr. 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 Patient claims she has not been able to keep any of the liquids down and has significant abdominal pain whenever she tries to eat.  As she was severely dehydrated she was asked to come to the emergency room for IV hydration and further management of her recurrent nausea and vomiting secondary to carcinomatosis. She has also been having problems with chronic constipation has not had a bowel movement over 5 days.  An attempt was made to put an NG tube in the ER but it was unsuccessful and therefore there was a thought to send her to IR for possible NG placement which the patient is not interested in having at this time.  Past Medical History:  Diagnosis Date  . Acute sensory neuropathy 06/26/2015  . Allergy   . Anemia   . Asthma    illness induced asthma  . Blood transfusion without reported diagnosis    at age 68 years old D/T surgery to femur being crushed  . 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 Heart murmur    pt, states had a "working heart murmur"  . History of kidney stones   . Neutropenia, drug-induced ([place]) 03/10/2018  . PONV (postoperative nausea and vomiting)   . Skin cancer    Past Surgical History:  Procedure Laterality Date  . 3 laporscopic proceedures    . ABDOMINAL HYSTERECTOMY     2010  . CHOLECYSTECTOMY     2010  . DILATION AND CURETTAGE OF UTERUS     1997  . IR IMAGING GUIDED PORT INSERTION  03/08/2018  . LAPAROSCOPY N/A 03/27/2015    Procedure: DIAGNOSTIC LAPAROSCOPY ;  Surgeon: [name], MD;  Location: [facility];  Service: Gynecology;  Laterality: N/A;  . LAPAROSCOPY N/A 02/24/2016   Procedure: LAPAROSCOPY DIAGNOSTIC WITH PERITONEAL WASHINGS AND PERITONEAL BIOPSY;  Surgeon: [name], MD;  Location: [facility];  Service: Gynecology;  Laterality: N/A;  . LAPAROTOMY N/A 03/27/2015   Procedure: EXPLORATORY LAPAROTOMY, BILATERAL SALPINGO OOPHORECTOMY, OMENTECTOMY, RADICAL TUMOR DEBULKING;  Surgeon: [name], MD;  Location: [facility];  Service: Gynecology;  Laterality: N/A;  . sinus surgery 1994     Family History  Problem Relation Age of Onset  . Hypertension Father   . Skin cancer Father        nonmelanoma skin cancers in his late 68s  . Non-Hodgkin's lymphoma Maternal Aunt        dx. 76s; smoker  . Stroke Maternal Grandmother   . Other Mother        benign meningioma dx. early-mid-70s; hysterectomy in her late 19s for heavy periods - still has ovaries  . Other Son        one son with pre-cancerous skin findings  . Other Daughter        cysts on ovaries and hx of heavy periods  . Other Sister        hysterectomy for cysts - still has ovaries  . Heart attack Maternal Grandfather   . Heart attack Paternal Grandfather   .  Renal cancer Maternal Aunt 60       smoker  . Breast cancer Maternal Aunt 78  . Other Maternal Aunt        dx. benign brain tumor (meningioma) at 69; 2nd benign brain tumor in her late 54s; hx of radical hysterectomy at age 23  . Brain cancer Other        NOS tumor   Social History:  reports that she has never smoked. She has never used smokeless tobacco. She reports current alcohol use. She reports that she does not use drugs.  Allergies:  Allergies  Allergen Reactions  . Bee Venom Shortness Of Breath    swelling  . Prochlorperazine Edisylate Anaphylaxis, Anxiety and Palpitations    Anaphylaxis, Anxiety and Palpitations   . Sulfa Antibiotics Shortness Of Breath    Vomit , diarrhea , hives  .  Phenergan [Promethazine Hcl] Other (See Comments)    "funny sensation in my throat"  . Zarxio [Filgrastim]    Medications: I have reviewed the patient's current medications.  Results for orders placed or performed during the hospital encounter of 08/30/19 (from the past 48 hour(s))  Comprehensive metabolic panel     Status: Abnormal   Collection Time: 08/30/19  7:04 PM  Result Value Ref Range   Sodium 131 (L) 135 - 145 mmol/L   Potassium 3.0 (L) 3.5 - 5.1 mmol/L   Chloride 70 (L) 98 - 111 mmol/L   CO2 40 (H) 22 - 32 mmol/L   Glucose, Bld 114 (H) 70 - 99 mg/dL    Comment: Glucose reference range applies only to samples taken after fasting for at least 8 hours.   BUN 43 (H) 6 - 20 mg/dL   Creatinine, Ser 1.35 (H) 0.44 - 1.00 mg/dL   Calcium 9.6 8.9 - 10.3 mg/dL   Total Protein 8.2 (H) 6.5 - 8.1 g/dL   Albumin 4.1 3.5 - 5.0 g/dL   AST 20 15 - 41 U/L   ALT 21 0 - 44 U/L   Alkaline Phosphatase 153 (H) 38 - 126 U/L   Total Bilirubin 1.0 0.3 - 1.2 mg/dL   GFR calc non Af Amer 44 (L) >60 mL/min   GFR calc Af Amer 51 (L) >60 mL/min   Anion gap 21 (H) 5 - 15    Comment: REPEATED TO VERIFY Performed at [place] [address] [place], [place] [address]   Lipase, blood     Status: None   Collection Time: 08/30/19  7:04 PM  Result Value Ref Range   Lipase 31 11 - 51 U/L    Comment: Performed at [facility], [place] [address] [place], [place] [address]  CBC with Differential     Status: Abnormal   Collection Time: 08/30/19  7:04 PM  Result Value Ref Range   WBC 4.6 4.0 - 10.5 K/uL   RBC 2.73 (L) 3.87 - 5.11 MIL/uL   Hemoglobin 8.9 (L) 12.0 - 15.0 g/dL   HCT 26.6 (L) 36 - 46 %   MCV 97.4 80.0 - 100.0 fL   MCH 32.6 26.0 - 34.0 pg   MCHC 33.5 30.0 - 36.0 g/dL   RDW 23.1 (H) 11.5 - 15.5 %   Platelets 173 150 - 400 K/uL   nRBC 0.0 0.0 - 0.2 %   Neutrophils Relative % 70 %   Neutro Abs 3.2 1.7 - 7.7 K/uL   Lymphocytes Relative 15 %   Lymphs  Abs 0.7 0.7 - 4.0  K/uL   Monocytes Relative 15 %   Monocytes Absolute 0.7 0 - 1 K/uL   Eosinophils Relative 0 %   Eosinophils Absolute 0.0 0 - 0 K/uL   Basophils Relative 0 %   Basophils Absolute 0.0 0 - 0 K/uL   Immature Granulocytes 0 %   Abs Immature Granulocytes 0.02 0.00 - 0.07 K/uL    Comment: Performed at [facility], [place] [address], [place], [place] [address]  SARS Coronavirus 2 by RT PCR (hospital order, performed in [facility] hospital lab) Nasopharyngeal Nasopharyngeal Swab     Status: None   Collection Time: 08/30/19  7:04 PM   Specimen: Nasopharyngeal Swab  Result Value Ref Range   SARS Coronavirus 2 NEGATIVE NEGATIVE    Comment: (NOTE) SARS-CoV-2 target nucleic acids are NOT DETECTED.  The SARS-CoV-2 RNA is generally detectable in upper and lower respiratory specimens during the acute phase of infection. The lowest concentration of SARS-CoV-2 viral copies this assay can detect is 250 copies / mL. A negative result does not preclude SARS-CoV-2 infection and should not be used as the sole basis for treatment or other patient management decisions.  A negative result may occur with improper specimen collection / handling, submission of specimen other than nasopharyngeal swab, presence of viral mutation(s) within the areas targeted by this assay, and inadequate number of viral copies (<250 copies / mL). A negative result must be combined with clinical observations, patient history, and epidemiological information.  Fact Sheet for Patients:   StrictlyIdeas.no  Fact Sheet for Healthcare Providers: BankingDealers.co.za  This test is not yet approved or  cleared by the [place] FDA and has been authorized for detection and/or diagnosis of SARS-CoV-2 by FDA under an Emergency Use Authorization (EUA).  This EUA will remain in effect (meaning this test can be used) for the duration of the COVID-19  declaration under Section 564(b)(1) of the Act, 21 U.S.C. section 360bbb-3(b)(1), unless the authorization is terminated or revoked sooner.  Performed at [facility], [place] [address] [place], [place] [address]   Magnesium     Status: None   Collection Time: 08/30/19  7:04 PM  Result Value Ref Range   Magnesium 2.4 1.7 - 2.4 mg/dL    Comment: Performed at [facility], [place] [address], [place], [place] [address]  I-stat chem 8, ED (not at [facility] or [facility])     Status: Abnormal   Collection Time: 08/30/19  7:17 PM  Result Value Ref Range   Sodium 127 (L) 135 - 145 mmol/L   Potassium 2.9 (L) 3.5 - 5.1 mmol/L   Chloride 72 (L) 98 - 111 mmol/L   BUN 41 (H) 6 - 20 mg/dL   Creatinine, Ser 1.50 (H) 0.44 - 1.00 mg/dL   Glucose, Bld 113 (H) 70 - 99 mg/dL    Comment: Glucose reference range applies only to samples taken after fasting for at least 8 hours.   Calcium, Ion 1.01 (L) 1.15 - 1.40 mmol/L   TCO2 44 (H) 22 - 32 mmol/L   Hemoglobin 9.5 (L) 12.0 - 15.0 g/dL   HCT 28.0 (L) 36 - 46 %  Urinalysis, Routine w reflex microscopic     Status: Abnormal   Collection Time: 08/30/19  8:36 PM  Result Value Ref Range   Color, Urine YELLOW YELLOW   APPearance HAZY (A) CLEAR   Specific Gravity, Urine 1.041 (H) 1.005 - 1.030   pH 6.0 5.0 - 8.0   Glucose, UA  NEGATIVE NEGATIVE mg/dL   Hgb urine dipstick NEGATIVE NEGATIVE   Bilirubin Urine NEGATIVE NEGATIVE   Ketones, ur 20 (A) NEGATIVE mg/dL   Protein, ur 30 (A) NEGATIVE mg/dL   Nitrite NEGATIVE NEGATIVE   Leukocytes,Ua SMALL (A) NEGATIVE   RBC / HPF 0-5 0 - 5 RBC/hpf   WBC, UA 11-20 0 - 5 WBC/hpf   Bacteria, UA RARE (A) NONE SEEN   Squamous Epithelial / LPF 0-5 0 - 5   Hyaline Casts, UA PRESENT    Non Squamous Epithelial 0-5 (A) NONE SEEN    Comment: Performed at [facility], [place] [address], [place], [place] [address]  HIV Antibody (routine testing w rflx)     Status: None   Collection  Time: 08/31/19  5:45 AM  Result Value Ref Range   HIV Screen 4th Generation wRfx Non Reactive Non Reactive    Comment: Performed at [facility], [place] [address] [place], [place] [address]  Basic metabolic panel     Status: Abnormal   Collection Time: 08/31/19  5:45 AM  Result Value Ref Range   Sodium 128 (L) 135 - 145 mmol/L   Potassium 3.1 (L) 3.5 - 5.1 mmol/L   Chloride 78 (L) 98 - 111 mmol/L   CO2 34 (H) 22 - 32 mmol/L   Glucose, Bld 92 70 - 99 mg/dL    Comment: Glucose reference range applies only to samples taken after fasting for at least 8 hours.   BUN 38 (H) 6 - 20 mg/dL   Creatinine, Ser 1.27 (H) 0.44 - 1.00 mg/dL   Calcium 8.4 (L) 8.9 - 10.3 mg/dL   GFR calc non Af Amer 47 (L) >60 mL/min   GFR calc Af Amer 55 (L) >60 mL/min   Anion gap 16 (H) 5 - 15    Comment: Performed at [facility], [place] [address] [place], [place] [address]  CBC     Status: Abnormal   Collection Time: 08/31/19  5:45 AM  Result Value Ref Range   WBC 3.3 (L) 4.0 - 10.5 K/uL   RBC 2.17 (L) 3.87 - 5.11 MIL/uL   Hemoglobin 7.0 (L) 12.0 - 15.0 g/dL   HCT 21.5 (L) 36 - 46 %   MCV 99.1 80.0 - 100.0 fL   MCH 32.3 26.0 - 34.0 pg   MCHC 32.6 30.0 - 36.0 g/dL   RDW 23.2 (H) 11.5 - 15.5 %   Platelets 140 (L) 150 - 400 K/uL   nRBC 0.0 0.0 - 0.2 %    Comment: Performed at [facility], [place] [address], [place], [place] [address]  Hepatic function panel     Status: Abnormal   Collection Time: 08/31/19  5:45 AM  Result Value Ref Range   Total Protein 6.6 6.5 - 8.1 g/dL   Albumin 3.2 (L) 3.5 - 5.0 g/dL   AST 16 15 - 41 U/L   ALT 17 0 - 44 U/L   Alkaline Phosphatase 118 38 - 126 U/L   Total Bilirubin 1.2 0.3 - 1.2 mg/dL   Bilirubin, Direct 0.1 0.0 - 0.2 mg/dL   Indirect Bilirubin 1.1 (H) 0.3 - 0.9 mg/dL    Comment: Performed at [facility], [place] [address], [place], [place] [address]  Procalcitonin - Baseline     Status: None   Collection Time:  08/31/19  5:45 AM  Result Value Ref Range   Procalcitonin 0.11 ng/mL    Comment:  Interpretation: PCT (Procalcitonin) <= 0.5 ng/mL: Systemic infection (sepsis) is not likely. Local bacterial infection is possible. (NOTE)       Sepsis PCT Algorithm           Lower Respiratory Tract                                      Infection PCT Algorithm    ----------------------------     ----------------------------         PCT < 0.25 ng/mL                PCT < 0.10 ng/mL          Strongly encourage             Strongly discourage   discontinuation of antibiotics    initiation of antibiotics    ----------------------------     -----------------------------       PCT 0.25 - 0.50 ng/mL            PCT 0.10 - 0.25 ng/mL               OR       >80% decrease in PCT            Discourage initiation of                                            antibiotics      Encourage discontinuation           of antibiotics    ----------------------------     -----------------------------         PCT >= 0.50 ng/mL              PCT 0.26 - 0.50 ng/mL               AND        <80% decrease in PCT             Encourage initiation of                                             antibiotics       Encourage continuation           of antibiotics    ----------------------------     -----------------------------        PCT >= 0.50 ng/mL                  PCT > 0.50 ng/mL               AND         increase in PCT                  Strongly encourage                                      initiation of antibiotics    Strongly encourage escalation           of antibiotics                                     -----------------------------  PCT <= 0.25 ng/mL                                                 OR                                        > 80% decrease in PCT                                      Discontinue / Do not initiate                                              antibiotics  Performed at [place] [address] [place], [place] [address]   CBG monitoring, ED     Status: None   Collection Time: 08/31/19  7:44 AM  Result Value Ref Range   Glucose-Capillary 89 70 - 99 mg/dL    Comment: Glucose reference range applies only to samples taken after fasting for at least 8 hours.  Glucose, capillary     Status: Abnormal   Collection Time: 08/31/19  4:48 PM  Result Value Ref Range   Glucose-Capillary 69 (L) 70 - 99 mg/dL    Comment: Glucose reference range applies only to samples taken after fasting for at least 8 hours.   *Note: Due to a large number of results and/or encounters for the requested time period, some results have not been displayed. A complete set of results can be found in Results Review.   CT ABDOMEN PELVIS W CONTRAST  Result Date: 08/30/2019 CLINICAL DATA:  Abdominal pain, bilious vomiting, ovarian cancer EXAM: CT ABDOMEN AND PELVIS WITH CONTRAST TECHNIQUE: Multidetector CT imaging of the abdomen and pelvis was performed using the standard protocol following bolus administration of intravenous contrast. CONTRAST:  40mL OMNIPAQUE IOHEXOL 300 MG/ML  SOLN COMPARISON:  07/30/2019 FINDINGS: Lower chest: Moderate left pleural effusion is again identified with compressive atelectasis of the left lung base. Visualized right lung base is clear. Central venous catheter tip noted within the superior cavoatrial junction. Cardiac size within normal limits. Hepatobiliary: Cholecystectomy has been performed. Stable hemangioma within the a inferior right hepatic lobe. The liver is otherwise unremarkable. No intra or extrahepatic biliary ductal dilation identified. Pancreas: Unremarkable Spleen: Unremarkable Adrenals/Urinary Tract: The adrenal glands are unremarkable. Mass effect upon the right kidney by the distended duodenum is again identified, however, the kidneys are otherwise unremarkable. There is normal symmetric renal cortical  enhancement. The bladder appears circumferentially mildly thick walled and demonstrates moderate perivesicular inflammatory stranding, similar to prior examination, possibly reflecting a chronic inflammatory process such as a chronic infection or radiation cystitis in the appropriate clinical setting. The bladder is not distended. Stomach/Bowel: There is massive distension of the a first and second portion of the duodenum to the level of the a point at which it crosses the midline. In this region, however, there is reasonable space between the abdominal aorta and superior mesenteric artery making SMA syndrome unlikely as a cause of obstruction. There is, however, numerous thick walled loops of  small bowel again identified clustered within the mid abdomen. There is moderate ascites contain within thickened peritoneum, similar to that noted on prior examination. 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 No focal point of transition is identified, though the caliber of bowel does improve once the ileum exits the encapsulating peritoneum, best seen on axial image # 60/2. No free intraperitoneal gas. The large bowel is unremarkable. Vascular/Lymphatic: No pathologic adenopathy seen within the abdomen and pelvis. The inferior vena cava is compressed, progressive since prior examination, likely a combination of the encapsulating process as well as some degree of hypovolemia. The arterial vasculature is unremarkable. Reproductive: Uterus absent.  No adnexal masses. Other: There is diffuse body wall wasting noted, progressive since prior examination. Musculoskeletal: The osseous structures are unremarkable. IMPRESSION: Diffuse bowel wall thickening and central clumping within encapsulating thickened peritoneum and moderate ascites. 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 This is  most commonly seen in the setting of peritoneal dialysis or peritoneal carcinomatosis. Massive distention of the duodenum is progressive since prior examination. Stable chronic bladder wall thickening and perivesicular inflammatory stranding, possibly reflecting changes of a radiation cystitis in the appropriate clinical setting. Progressive body wall wasting. Electronically Signed   By: Fidela Salisbury MD   On: 08/30/2019 20:35   ROS:  As stated above in the HPI otherwise negative.  Blood pressure 98/60, pulse 73, temperature 99.2 F (37.3 C), temperature source Oral, resp. rate 16, height 5' 2.5" (1.588 m), weight 37.1 kg, SpO2 98 %.  PE: Gen: NAD, Alert and Oriented, emaciated cachectic HEENT:  [place]/AT, EOMI Neck: Supple, no LAD Lungs: CTA Bilaterally CV: RRR without M/G/R ABM: Soft, with hypoactive bowel sounds Ext: No C/C/E  Assessment/Plan: 1) Metastatic fallopian tube cancer with carcinomatosis-I have discussed the patient's case at length with Dr. Carol Ada and Dr. Justice Britain.-Greatly appreciate Dr. Donneta Romberg recommendations. I have had a detailed discussion with the patient and her husband about the high failure rates with duodenal stenting in the complications associated with an endoscopy in view of her extensive carcinomatosis. Therefore they have decided that they will not proceed any endoscopic evaluation or stenting. [place] is interested in having feeding jejunostomy if it is possible to place one.  2) Severe protein calorie malnutrition. 3) Anemia of chronic disease. 4) Acute kidney injury. 5) Full code. Dr. Silvano Rusk is covering over the weekend and is available to answer questions if need arises. Juanita Craver 08/31/2019, 6:10 PM

## 2019-08-31 NOTE — ED Notes (Signed)
Assisted pt to use bed pan.

## 2019-08-31 NOTE — Progress Notes (Signed)
Advanced Endoscopy Brief Note  Asked by Dr. Collene Mares to evaluate the patient's imaging studies over the last few months. Reviewed in depth report and personally reviewed the imaging.   Worsening obstruction occurring. Etiology seems to be progressive malignancy and now the possibility of encapsulating peritonitis as an inflammatory process. I can see the proximal dilated portion of the duodenum and follow into the distal duodenum but there is significant thickening noted throughout that region.  It is not clear that a transition point is present that overt enteral stenting would be possible. If this were to be entertained in regards to at least and endoscopy, it would be ideal to have a NGT in place for decompression if possible. Also, the risks for perforation are higher in this setting of what is presumed inflammation even with just endoscopy. It is not clear if she would be a candidate for Venting G-J due to the amount of ascites that she has in place. Very difficult situation overall. Place NGT at least for now while further discussions occur around [place].  Dr. Mann/Dr. Benson Norway will evaluate patient formally today.  Justice Britain, MD [facility] Advanced Endoscopy Office # [phone]

## 2019-08-31 NOTE — Progress Notes (Signed)
Pharmacy Antibiotic Note  Kathryn Ramsey is a 54 y.o. female admitted on 08/30/2019 with Intra-abdominal infection.  Pharmacy has been consulted for Ciprofloxacin dosing.  Plan: Ciprofloxacin 400mg  iv q24hr     Temp (24hrs), Avg:97.9 F (36.6 C), Min:97.9 F (36.6 C), Max:97.9 F (36.6 C)  Recent Labs  Lab 08/27/19 1125 08/30/19 1904 08/30/19 1917 08/31/19 0545  WBC 2.8* 4.6  --  3.3*  CREATININE 1.02* 1.35* 1.50*  --     Estimated Creatinine Clearance: 24.5 mL/min (A) (by C-G formula based on SCr of 1.5 mg/dL (H)).    Allergies  Allergen Reactions  . Bee Venom Shortness Of Breath    swelling  . Prochlorperazine Edisylate Anaphylaxis, Anxiety and Palpitations    Anaphylaxis, Anxiety and Palpitations   . Sulfa Antibiotics Shortness Of Breath    Vomit , diarrhea , hives  . Phenergan [Promethazine Hcl] Other (See Comments)    "funny sensation in my throat"  . Zarxio [Filgrastim]     Antimicrobials this admission: Ciprofloxacin 08/30/2019 >> Flagyl 08/30/2019 >>   Dose adjustments this admission: -  Microbiology results: -  Thank you for allowing pharmacy to be a part of this patient's care.  Kathryn Ramsey 08/31/2019 6:20 AM

## 2019-08-31 NOTE — Progress Notes (Signed)
PROGRESS NOTE    Cherrill Scrima  JKK:[id number] DOB: August 11, 1965 DOA: 08/30/2019 PCP: Patient, No Pcp Per   Brief Narrative:  Kathryn Ramsey is a 54 y.o. female with history of metastatic fallopian tube cancer with carcinomatosis was referred to the ER the patient was having persistent nausea vomiting.  Patient has been any nausea vomiting which is acutely worsened last 2 days and last bowel movement almost a week ago.  Patient is being followed by level gastroenterologist and is planned to have a stent placed in small bowel but due to worsening vomiting patient presents to the ER after discussing with oncologist. In the ER patient had a CT abdomen pelvis which shows features concerning for peritoneal carcinomatosis.  ER physician has discussed with on-call oncologist and gastroenterologist. Gastroenterologist is recommending NG tube placement IV antibiotics fluids n.p.o.  NGT was unable to be placed despite multiple attempts. Patient was started on Flagyl and Cipro.  Labs are remarkable for acute renal failure with creatinine of 1.3 and hemoglobin is around 8.9 Covid test was negative.  Potassium was low at 3.   Assessment & Plan:   Principal Problem:   Nausea & vomiting Active Problems:   Abdominal carcinomatosis (HCC)   Cancer of right fallopian tube (HCC)   Anemia due to antineoplastic chemotherapy   Intractable N/V - in the setting of suspected peritoneal carcinomatosis POA Rule out infection -Unclear etiology -Oncology and GI on board appreciate insight and recommendations -Patient has known abdominal metastases with questionable small bowel obstruction, as per history occasionally has episodes of emesis despite p.o. intake -Patient placed prophylactically on antibiotics at intake, will discontinue, follow procalcitonin, patient does not meet sepsis criteria given no clear source, patient has no fever, mild leukopenia likely though related to cancer as below -There has been discussion about  NG tube placement, patient currently well controlled without any symptoms, given failure of NG tube placement earlier would hold off until patient becomes symptomatic given her anatomy and difficulty with previous NG tube placement -Oncology and GI have also discussed venting PEG tube or J-tube with patient and husband, they are both currently evaluating risks versus benefits of this procedure, I certainly see the benefit of the procedure given patient's symptoms and likely current progressive disease -Duodenal stent also a possible treatment modality although endoscopy would likely be difficult given patient's current state -Outpatient chemotherapy treatment currently on hold due to patient's weakened state -Continue supportive care, IV Zofran, Phenergan, pain control as well as IV fluids in the setting of poor p.o. intake  AKI, POA -In the setting of poor p.o. intake, continue IV fluids as above  Acute symptomatic anemia in the setting of above -No active signs of bleeding, likely in the setting of chemotherapy and malnutrition given poor p.o. intake  Metastatic fallopian tube carcinoma followed by Dr. Jana Hakim -Defer to heme-onc for further treatment, currently holding chemotherapy given her worsening status  Severe protein caloric malnutrition -In the setting of above, continue to encourage p.o. intake once acute symptoms have resolved, patient may need to advance to a liquid diet henceforth or if a G-tube or J-tube is placed certainly tube feeding supplementation would be reasonable.  DVT prophylaxis: SCDs for now given borderline anemia source of bleeding/Anticipation of possible procedure Code Status: Full code. Family Communication: Husband at bedside  Status is: Inpatient  Dispo: The patient is from: Home              Anticipated d/c is to: Home  Anticipated d/c date is: Likely 40 to 72 hours              Patient currently NOT medically stable for  discharge;  Consultants:   GI, oncology  Procedures:   None planned  Antimicrobials:  Cipro, Flagyl at admission, discontinued 08/31/2019  Subjective: Nausea vomiting and abdominal pain markedly improved over the morning, patient denies any ongoing symptoms other than bilateral foot paresthesias due to home medications being stopped.  She otherwise denies chest pain, shortness of breath, diarrhea, constipation, headache, fevers, chills.  Objective: Vitals:   08/31/19 0633 08/31/19 0700 08/31/19 0732 08/31/19 0802  BP: (!) 87/57 92/61 91/67  92/64  Pulse: 77 75 72 77  Resp: 16 15 10 12   Temp:      TempSrc:      SpO2: 95% 97% 98% 99%    Intake/Output Summary (Last 24 hours) at 08/31/2019 0813 Last data filed at 08/31/2019 0648 Gross per 24 hour  Intake 1749.75 ml  Output 200 ml  Net 1549.75 ml   There were no vitals filed for this visit.  Examination:  General:  Pleasantly resting in bed, No acute distress.  Somewhat cachectic appearing female. HEENT:  Normocephalic atraumatic.  Sclerae nonicteric, noninjected.  Extraocular movements intact bilaterally. Neck:  Without mass or deformity.  Trachea is midline. Lungs:  Clear to auscultate bilaterally without rhonchi, wheeze, or rales. Heart:  Regular rate and rhythm.  Without murmurs, rubs, or gallops. Abdomen:  Soft, tender right upper quadrant, otherwise without guarding or rebound. Extremities: Without cyanosis, clubbing, edema, or obvious deformity. Vascular:  Dorsalis pedis and posterior tibial pulses palpable bilaterally. Skin:  Warm and dry, no erythema, no ulcerations.  Data Reviewed: I have personally reviewed following labs and imaging studies  CBC: Recent Labs  Lab 08/27/19 1125 08/30/19 1904 08/30/19 1917 08/31/19 0545  WBC 2.8* 4.6  --  3.3*  NEUTROABS 1.8 3.2  --   --   HGB 8.2* 8.9* 9.5* 7.0*  HCT 24.9* 26.6* 28.0* 21.5*  MCV 97.6 97.4  --  99.1  PLT 80* 173  --  540*   Basic Metabolic  Panel: Recent Labs  Lab 08/27/19 1125 08/30/19 1904 08/30/19 1917 08/31/19 0545  NA 133* 131* 127* 128*  K 3.2* 3.0* 2.9* 3.1*  CL 87* 70* 72* 78*  CO2 34* 40*  --  34*  GLUCOSE 108* 114* 113* 92  BUN 27* 43* 41* 38*  CREATININE 1.02* 1.35* 1.50* 1.27*  CALCIUM 9.3 9.6  --  8.4*  MG 1.3* 2.4  --   --    GFR: Estimated Creatinine Clearance: 28.9 mL/min (A) (by C-G formula based on SCr of 1.27 mg/dL (H)). Liver Function Tests: Recent Labs  Lab 08/27/19 1125 08/30/19 1904 08/31/19 0545  AST 18 20 16   ALT 14 21 17   ALKPHOS 133* 153* 118  BILITOT 0.6 1.0 1.2  PROT 7.5 8.2* 6.6  ALBUMIN 3.3* 4.1 3.2*   Recent Labs  Lab 08/30/19 1904  LIPASE 31   No results for input(s): AMMONIA in the last 168 hours. Coagulation Profile: No results for input(s): INR, PROTIME in the last 168 hours. Cardiac Enzymes: No results for input(s): CKTOTAL, CKMB, CKMBINDEX, TROPONINI in the last 168 hours. BNP (last 3 results) No results for input(s): PROBNP in the last 8760 hours. HbA1C: No results for input(s): HGBA1C in the last 72 hours. CBG: Recent Labs  Lab 08/31/19 0744  GLUCAP 89   Lipid Profile: No results for input(s): CHOL, HDL, LDLCALC, TRIG,  CHOLHDL, LDLDIRECT in the last 72 hours. Thyroid Function Tests: No results for input(s): TSH, T4TOTAL, FREET4, T3FREE, THYROIDAB in the last 72 hours. Anemia Panel: No results for input(s): VITAMINB12, FOLATE, FERRITIN, TIBC, IRON, RETICCTPCT in the last 72 hours. Sepsis Labs: No results for input(s): PROCALCITON, LATICACIDVEN in the last 168 hours.  Recent Results (from the past 240 hour(s))  SARS Coronavirus 2 by RT PCR (hospital order, performed in [facility] hospital lab) Nasopharyngeal Nasopharyngeal Swab     Status: None   Collection Time: 08/30/19  7:04 PM   Specimen: Nasopharyngeal Swab  Result Value Ref Range Status   SARS Coronavirus 2 NEGATIVE NEGATIVE Final    Comment: (NOTE) SARS-CoV-2 target nucleic acids are NOT  DETECTED.  The SARS-CoV-2 RNA is generally detectable in upper and lower respiratory specimens during the acute phase of infection. The lowest concentration of SARS-CoV-2 viral copies this assay can detect is 250 copies / mL. A negative result does not preclude SARS-CoV-2 infection and should not be used as the sole basis for treatment or other patient management decisions.  A negative result may occur with improper specimen collection / handling, submission of specimen other than nasopharyngeal swab, presence of viral mutation(s) within the areas targeted by this assay, and inadequate number of viral copies (<250 copies / mL). A negative result must be combined with clinical observations, patient history, and epidemiological information.  Fact Sheet for Patients:   StrictlyIdeas.no  Fact Sheet for Healthcare Providers: BankingDealers.co.za  This test is not yet approved or  cleared by the [place] FDA and has been authorized for detection and/or diagnosis of SARS-CoV-2 by FDA under an Emergency Use Authorization (EUA).  This EUA will remain in effect (meaning this test can be used) for the duration of the COVID-19 declaration under Section 564(b)(1) of the Act, 21 U.S.C. section 360bbb-3(b)(1), unless the authorization is terminated or revoked sooner.  Performed at [facility], [place] [address] [place], [place] [address]          Radiology Studies: CT ABDOMEN PELVIS W CONTRAST  Result Date: 08/30/2019 CLINICAL DATA:  Abdominal pain, bilious vomiting, ovarian cancer EXAM: CT ABDOMEN AND PELVIS WITH CONTRAST TECHNIQUE: Multidetector CT imaging of the abdomen and pelvis was performed using the standard protocol following bolus administration of intravenous contrast. CONTRAST:  31mL OMNIPAQUE IOHEXOL 300 MG/ML  SOLN COMPARISON:  07/30/2019 FINDINGS: Lower chest: Moderate left pleural effusion is again  identified with compressive atelectasis of the left lung base. Visualized right lung base is clear. Central venous catheter tip noted within the superior cavoatrial junction. Cardiac size within normal limits. Hepatobiliary: Cholecystectomy has been performed. Stable hemangioma within the a inferior right hepatic lobe. The liver is otherwise unremarkable. No intra or extrahepatic biliary ductal dilation identified. Pancreas: Unremarkable Spleen: Unremarkable Adrenals/Urinary Tract: The adrenal glands are unremarkable. Mass effect upon the right kidney by the distended duodenum is again identified, however, the kidneys are otherwise unremarkable. There is normal symmetric renal cortical enhancement. The bladder appears circumferentially mildly thick walled and demonstrates moderate perivesicular inflammatory stranding, similar to prior examination, possibly reflecting a chronic inflammatory process such as a chronic infection or radiation cystitis in the appropriate clinical setting. The bladder is not distended. Stomach/Bowel: There is massive distension of the a first and second portion of the duodenum to the level of the a point at which it crosses the midline. In this region, however, there is reasonable space between the abdominal aorta and superior mesenteric artery making SMA syndrome  unlikely as a cause of obstruction. There is, however, numerous thick walled loops of small bowel again identified clustered within the mid abdomen. There is moderate ascites contain within thickened peritoneum, similar to that noted on prior examination. 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 No focal point of transition is identified, though the caliber of bowel does improve once the ileum exits the encapsulating peritoneum, best seen on axial image # 60/2. No free intraperitoneal gas. The large bowel is unremarkable.  Vascular/Lymphatic: No pathologic adenopathy seen within the abdomen and pelvis. The inferior vena cava is compressed, progressive since prior examination, likely a combination of the encapsulating process as well as some degree of hypovolemia. The arterial vasculature is unremarkable. Reproductive: Uterus absent.  No adnexal masses. Other: There is diffuse body wall wasting noted, progressive since prior examination. Musculoskeletal: The osseous structures are unremarkable. IMPRESSION: Diffuse bowel wall thickening and central clumping within encapsulating thickened peritoneum and moderate ascites. 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 This is most commonly seen in the setting of peritoneal dialysis or peritoneal carcinomatosis. Massive distention of the duodenum is progressive since prior examination. Stable chronic bladder wall thickening and perivesicular inflammatory stranding, possibly reflecting changes of a radiation cystitis in the appropriate clinical setting. Progressive body wall wasting. Electronically Signed   By: Fidela Salisbury MD   On: 08/30/2019 20:35   Scheduled Meds:  gabapentin  200 mg Oral QHS   lidocaine  1 patch Transdermal Q24H   Continuous Infusions:  0.9 % NaCl with KCl 20 mEq / L 75 mL/hr at 08/30/19 2235   ciprofloxacin     metronidazole Stopped (08/31/19 0648)    LOS: 1 day   Time spent: 75min  Omauri Boeve C Antoniette Peake, DO [facility]  If 7PM-7AM, please contact night-coverage [email or web address]  08/31/2019, 8:13 AM

## 2019-09-01 ENCOUNTER — Inpatient Hospital Stay: Payer: 59

## 2019-09-01 DIAGNOSIS — C762 Malignant neoplasm of abdomen: Secondary | ICD-10-CM | POA: Diagnosis not present

## 2019-09-01 DIAGNOSIS — R112 Nausea with vomiting, unspecified: Secondary | ICD-10-CM | POA: Diagnosis not present

## 2019-09-01 DIAGNOSIS — C5701 Malignant neoplasm of right fallopian tube: Secondary | ICD-10-CM | POA: Diagnosis not present

## 2019-09-01 DIAGNOSIS — D6481 Anemia due to antineoplastic chemotherapy: Secondary | ICD-10-CM | POA: Diagnosis not present

## 2019-09-01 LAB — COMPREHENSIVE METABOLIC PANEL
ALT: 13 U/L (ref 0–44)
AST: 14 U/L — ABNORMAL LOW (ref 15–41)
Albumin: 3.2 g/dL — ABNORMAL LOW (ref 3.5–5.0)
Alkaline Phosphatase: 102 U/L (ref 38–126)
Anion gap: 11 (ref 5–15)
BUN: 23 mg/dL — ABNORMAL HIGH (ref 6–20)
CO2: 31 mmol/L (ref 22–32)
Calcium: 8.4 mg/dL — ABNORMAL LOW (ref 8.9–10.3)
Chloride: 88 mmol/L — ABNORMAL LOW (ref 98–111)
Creatinine, Ser: 0.91 mg/dL (ref 0.44–1.00)
GFR calc Af Amer: >60 mL/min (ref >60)
GFR calc non Af Amer: >60 mL/min (ref >60)
Glucose, Bld: 117 mg/dL — ABNORMAL HIGH (ref 70–99)
Potassium: 3.2 mmol/L — ABNORMAL LOW (ref 3.5–5.1)
Sodium: 130 mmol/L — ABNORMAL LOW (ref 135–145)
Total Bilirubin: 0.6 mg/dL (ref 0.3–1.2)
Total Protein: 6.6 g/dL (ref 6.5–8.1)

## 2019-09-01 LAB — CBC
HCT: 21 % — ABNORMAL LOW (ref 36.0–46.0)
Hemoglobin: 7.1 g/dL — ABNORMAL LOW (ref 12.0–15.0)
MCH: 33.8 pg (ref 26.0–34.0)
MCHC: 33.8 g/dL (ref 30.0–36.0)
MCV: 100 fL (ref 80.0–100.0)
Platelets: 145 10*3/uL — ABNORMAL LOW (ref 150–400)
RBC: 2.1 MIL/uL — ABNORMAL LOW (ref 3.87–5.11)
RDW: 23.3 % — ABNORMAL HIGH (ref 11.5–15.5)
WBC: 3.3 10*3/uL — ABNORMAL LOW (ref 4.0–10.5)
nRBC: 0 % (ref 0.0–0.2)

## 2019-09-01 LAB — GLUCOSE, CAPILLARY
Glucose-Capillary: 102 mg/dL — ABNORMAL HIGH (ref 70–99)
Glucose-Capillary: 114 mg/dL — ABNORMAL HIGH (ref 70–99)
Glucose-Capillary: 98 mg/dL (ref 70–99)

## 2019-09-01 LAB — PROCALCITONIN: Procalcitonin: <0.1 ng/mL

## 2019-09-01 MED ORDER — GABAPENTIN 100 MG PO CAPS: 200.0000 mg | ORAL_CAPSULE | Freq: Every day | ORAL | Status: DC

## 2019-09-01 MED ORDER — PANTOPRAZOLE SODIUM 40 MG IV SOLR
40.0000 mg | Freq: Two times a day (BID) | INTRAVENOUS | Status: DC
  Administered 2019-09-01 – 2019-09-04 (×7): 40 mg via INTRAVENOUS
  Filled 2019-09-01 (×7): qty 40

## 2019-09-01 MED ORDER — TRAMADOL HCL 50 MG PO TABS
50.0000 mg | ORAL_TABLET | Freq: Four times a day (QID) | ORAL | Status: DC | PRN
  Administered 2019-09-01 – 2019-09-04 (×4): 50 mg via ORAL
  Filled 2019-09-01 (×4): qty 1

## 2019-09-01 MED ORDER — ACETAMINOPHEN 325 MG PO TABS
650.0000 mg | ORAL_TABLET | Freq: Four times a day (QID) | ORAL | Status: DC | PRN
  Administered 2019-09-01 – 2019-09-04 (×4): 650 mg via ORAL
  Filled 2019-09-01 (×4): qty 2

## 2019-09-01 MED ORDER — SODIUM CHLORIDE 0.9% FLUSH: 10.0000 mL | INTRAVENOUS | Status: DC | PRN

## 2019-09-01 MED ORDER — BOOST / RESOURCE BREEZE PO LIQD CUSTOM
1.0000 | Freq: Two times a day (BID) | ORAL | Status: DC
  Administered 2019-09-01 – 2019-09-02 (×2): 1 via ORAL

## 2019-09-01 MED ORDER — MELATONIN 3 MG PO TABS
3.0000 mg | ORAL_TABLET | Freq: Every day | ORAL | Status: DC
  Administered 2019-09-01 – 2019-09-03 (×3): 3 mg via ORAL
  Filled 2019-09-01 (×3): qty 1

## 2019-09-01 MED ORDER — DIPHENHYDRAMINE HCL 50 MG/ML IJ SOLN
25.0000 mg | Freq: Once | INTRAMUSCULAR | Status: DC
  Filled 2019-09-01: qty 1

## 2019-09-01 MED ORDER — ENSURE ENLIVE PO LIQD: 237.0000 mL | Freq: Three times a day (TID) | ORAL | Status: DC

## 2019-09-01 MED ORDER — SERTRALINE HCL 50 MG PO TABS
75.0000 mg | ORAL_TABLET | Freq: Every day | ORAL | Status: DC
  Administered 2019-09-01 – 2019-09-04 (×4): 75 mg via ORAL
  Filled 2019-09-01 (×4): qty 1

## 2019-09-01 MED ORDER — LORAZEPAM 0.5 MG PO TABS: 0.5000 mg | ORAL_TABLET | Freq: Every evening | ORAL | Status: DC | PRN

## 2019-09-01 MED ORDER — GABAPENTIN 100 MG PO CAPS
200.0000 mg | ORAL_CAPSULE | Freq: Two times a day (BID) | ORAL | Status: DC
  Administered 2019-09-01 – 2019-09-04 (×4): 200 mg via ORAL
  Filled 2019-09-01 (×6): qty 2

## 2019-09-01 MED ORDER — POTASSIUM CHLORIDE 10 MEQ/100ML IV SOLN
10.0000 meq | INTRAVENOUS | Status: AC
  Administered 2019-09-01 (×4): 10 meq via INTRAVENOUS
  Filled 2019-09-01 (×4): qty 100

## 2019-09-01 MED ORDER — SODIUM CHLORIDE 0.9% FLUSH
10.0000 mL | Freq: Two times a day (BID) | INTRAVENOUS | Status: DC
  Administered 2019-09-01: 10 mL

## 2019-09-01 MED ORDER — GABAPENTIN 300 MG PO CAPS
300.0000 mg | ORAL_CAPSULE | Freq: Every day | ORAL | Status: DC
  Administered 2019-09-01 – 2019-09-03 (×3): 300 mg via ORAL
  Filled 2019-09-01 (×3): qty 1

## 2019-09-01 NOTE — Progress Notes (Signed)
PROGRESS NOTE    Kathryn Ramsey  ERX:[id number] DOB: September 03, 1965 DOA: 08/30/2019 PCP: Patient, No Pcp Per   Brief Narrative:  Kathryn Ramsey is a 54 y.o. female with history of metastatic fallopian tube cancer with carcinomatosis was referred to the ER the patient was having persistent nausea vomiting.  Patient has been any nausea vomiting which is acutely worsened last 2 days and last bowel movement almost a week ago.  Patient is being followed by [facility] gastroenterologist and is planned to have a stent placed in small bowel but due to worsening vomiting patient presents to the ER after discussing with oncologist. In the ER patient had a CT abdomen pelvis which shows features concerning for peritoneal carcinomatosis.  ER physician has discussed with on-call oncologist and gastroenterologist. Gastroenterologist is recommending NG tube placement IV antibiotics fluids n.p.o.  NGT was unable to be placed despite multiple attempts. Patient was started on Flagyl and Cipro.  Labs are remarkable for acute renal failure with creatinine of 1.3 and hemoglobin is around 8.9 Covid test was negative.  Potassium was low at 3.   Assessment & Plan:   Principal Problem:   Nausea & vomiting Active Problems:   Abdominal carcinomatosis (HCC)   Cancer of right fallopian tube (HCC)   Anemia due to antineoplastic chemotherapy   Intractable N/V - in the setting of suspected partial obstruction versus worsening peritoneal carcinomatosis POA Infection ruled out -He discussion with multiple specialties today about prognosis and further intervention, patient appears unfortunately to be unable to have [place] tube placed which was initially the plan to have J-tube inserted past obstruction feeding and G tube to suction proximal to obstruction, unfortunately IR is unable to perform this procedure due to worsening metastatic disease and ascites. -Oncology IR, and GI on board appreciate insight and recommendations -Patient has known  abdominal metastases with questionable small bowel obstruction, as per history occasionally has episodes of emesis despite p.o. intake -Patient placed prophylactically on antibiotics at intake, have discontinued due to unremarkable procalcitonin, lack of fever and only minimal leukopenia in the setting of above -Continue to hold off on NG tube placement given patient symptoms are currently well controlled, advancing diet to full liquids, will follow along -Duodenal stent is unfortunately not an option as well given patient's condition as above, with high risk for endoscopy and perforation -Outpatient chemotherapy treatment currently on hold due to patient's weakened state -Continue supportive care, IV Zofran, Phenergan, pain control as well as IV fluids in the setting of poor p.o. intake  AKI, POA, resolving -In the setting of poor p.o. intake, continue IV fluids as above until p.o. intake is adequate to maintain volume status Lab Results  Component Value Date   CREATININE 0.91 09/01/2019   CREATININE 1.27 (H) 08/31/2019   CREATININE 1.50 (H) 08/30/2019   Acute symptomatic anemia in the setting of above -No active signs of bleeding, likely in the setting of chemotherapy and malnutrition given poor p.o. intake -Hemoglobin remains stable, patient remains asymptomatic hold off on transfusion CBC Latest Ref Rng & Units 09/01/2019 08/31/2019 08/30/2019  WBC 4.0 - 10.5 K/uL 3.3(L) 3.3(L) -  Hemoglobin 12.0 - 15.0 g/dL 7.1(L) 7.0(L) 9.5(L)  Hematocrit 36 - 46 % 21.0(L) 21.5(L) 28.0(L)  Platelets 150 - 400 K/uL 145(L) 140(L) -   Metastatic fallopian tube carcinoma followed by Dr. Jana Ramsey -Defer to heme-onc for further treatment, currently holding chemotherapy given her worsening status  Severe protein caloric malnutrition -In the setting of above, continue to encourage p.o. intake once  acute symptoms have resolved, patient may need to advance to a liquid diet henceforth or if a G-tube or J-tube is  placed certainly tube feeding supplementation would be reasonable.  DVT prophylaxis: SCDs for now given borderline anemia without overt source of bleeding/Anticipation of possible procedure Code Status: Full code. Family Communication: Husband at bedside  Status is: Inpatient  Dispo: The patient is from: Home              Anticipated d/c is to: Home              Anticipated d/c date is: Likely 40 to 72 hours              Patient currently NOT medically stable for discharge;  Consultants:   GI, oncology, IR  Procedures:   None planned  Antimicrobials:  Cipro, Flagyl at admission, discontinued 08/31/2019  Subjective: Nausea vomiting and abdominal pain markedly improved though she did have a single episode of vomiting overnight, patient denies any ongoing symptoms other than bilateral foot paresthesias due to home medications being stopped.  She otherwise denies chest pain, shortness of breath, diarrhea, constipation, headache, fevers, chills.  Objective: Vitals:   08/31/19 1808 08/31/19 1958 09/01/19 0200 09/01/19 0640  BP:  98/60 (!) 91/57 100/62  Pulse:  78 69 75  Resp:  16 14 15   Temp:  99.5 F (37.5 C) 99 F (37.2 C) 99.2 F (37.3 C)  TempSrc:  Oral Oral Oral  SpO2:  98% 98% 100%  Weight: 37.1 kg     Height: 5' 2.5" (1.588 m)       Intake/Output Summary (Last 24 hours) at 09/01/2019 0738 Last data filed at 09/01/2019 0200 Gross per 24 hour  Intake 1929.39 ml  Output --  Net 1929.39 ml   Filed Weights   08/31/19 1808  Weight: 37.1 kg    Examination:  General:  Pleasantly resting in bed, No acute distress.  Somewhat cachectic appearing female. HEENT:  Normocephalic atraumatic.  Sclerae nonicteric, noninjected.  Extraocular movements intact bilaterally. Neck:  Without mass or deformity.  Trachea is midline. Lungs:  Clear to auscultate bilaterally without rhonchi, wheeze, or rales. Heart:  Regular rate and rhythm.  Without murmurs, rubs, or gallops. Abdomen:   Soft, tender right upper quadrant, otherwise without guarding or rebound. Extremities: Without cyanosis, clubbing, edema, or obvious deformity. Vascular:  Dorsalis pedis and posterior tibial pulses palpable bilaterally. Skin:  Warm and dry, no erythema, no ulcerations.  Data Reviewed: I have personally reviewed following labs and imaging studies  CBC: Recent Labs  Lab 08/27/19 1125 08/30/19 1904 08/30/19 1917 08/31/19 0545  WBC 2.8* 4.6  --  3.3*  NEUTROABS 1.8 3.2  --   --   HGB 8.2* 8.9* 9.5* 7.0*  HCT 24.9* 26.6* 28.0* 21.5*  MCV 97.6 97.4  --  99.1  PLT 80* 173  --  706*   Basic Metabolic Panel: Recent Labs  Lab 08/27/19 1125 08/30/19 1904 08/30/19 1917 08/31/19 0545  NA 133* 131* 127* 128*  K 3.2* 3.0* 2.9* 3.1*  CL 87* 70* 72* 78*  CO2 34* 40*  --  34*  GLUCOSE 108* 114* 113* 92  BUN 27* 43* 41* 38*  CREATININE 1.02* 1.35* 1.50* 1.27*  CALCIUM 9.3 9.6  --  8.4*  MG 1.3* 2.4  --   --    GFR: Estimated Creatinine Clearance: 29 mL/min (A) (by C-G formula based on SCr of 1.27 mg/dL (H)). Liver Function Tests: Recent Labs  Lab 08/27/19  1125 08/30/19 1904 08/31/19 0545  AST 18 20 16   ALT 14 21 17   ALKPHOS 133* 153* 118  BILITOT 0.6 1.0 1.2  PROT 7.5 8.2* 6.6  ALBUMIN 3.3* 4.1 3.2*   Recent Labs  Lab 08/30/19 1904  LIPASE 31   No results for input(s): AMMONIA in the last 168 hours. Coagulation Profile: No results for input(s): INR, PROTIME in the last 168 hours. Cardiac Enzymes: No results for input(s): CKTOTAL, CKMB, CKMBINDEX, TROPONINI in the last 168 hours. BNP (last 3 results) No results for input(s): PROBNP in the last 8760 hours. HbA1C: No results for input(s): HGBA1C in the last 72 hours. CBG: Recent Labs  Lab 08/31/19 0744 08/31/19 1648 08/31/19 2349  GLUCAP 89 69* 90   Lipid Profile: No results for input(s): CHOL, HDL, LDLCALC, TRIG, CHOLHDL, LDLDIRECT in the last 72 hours. Thyroid Function Tests: No results for input(s): TSH,  T4TOTAL, FREET4, T3FREE, THYROIDAB in the last 72 hours. Anemia Panel: No results for input(s): VITAMINB12, FOLATE, FERRITIN, TIBC, IRON, RETICCTPCT in the last 72 hours. Sepsis Labs: Recent Labs  Lab 08/31/19 0545  PROCALCITON 0.11    Recent Results (from the past 240 hour(s))  SARS Coronavirus 2 by RT PCR (hospital order, performed in [facility] hospital lab) Nasopharyngeal Nasopharyngeal Swab     Status: None   Collection Time: 08/30/19  7:04 PM   Specimen: Nasopharyngeal Swab  Result Value Ref Range Status   SARS Coronavirus 2 NEGATIVE NEGATIVE Final    Comment: (NOTE) SARS-CoV-2 target nucleic acids are NOT DETECTED.  The SARS-CoV-2 RNA is generally detectable in upper and lower respiratory specimens during the acute phase of infection. The lowest concentration of SARS-CoV-2 viral copies this assay can detect is 250 copies / mL. A negative result does not preclude SARS-CoV-2 infection and should not be used as the sole basis for treatment or other patient management decisions.  A negative result may occur with improper specimen collection / handling, submission of specimen other than nasopharyngeal swab, presence of viral mutation(s) within the areas targeted by this assay, and inadequate number of viral copies (<250 copies / mL). A negative result must be combined with clinical observations, patient history, and epidemiological information.  Fact Sheet for Patients:   StrictlyIdeas.no  Fact Sheet for Healthcare Providers: BankingDealers.co.za  This test is not yet approved or  cleared by the [place] FDA and has been authorized for detection and/or diagnosis of SARS-CoV-2 by FDA under an Emergency Use Authorization (EUA).  This EUA will remain in effect (meaning this test can be used) for the duration of the COVID-19 declaration under Section 564(b)(1) of the Act, 21 U.S.C. section 360bbb-3(b)(1), unless the  authorization is terminated or revoked sooner.  Performed at [facility], [place] [address], [place], [place] [address]          Radiology Studies: CT ABDOMEN PELVIS W CONTRAST  Result Date: 08/30/2019 CLINICAL DATA:  Abdominal pain, bilious vomiting, ovarian cancer EXAM: CT ABDOMEN AND PELVIS WITH CONTRAST TECHNIQUE: Multidetector CT imaging of the abdomen and pelvis was performed using the standard protocol following bolus administration of intravenous contrast. CONTRAST:  70mL OMNIPAQUE IOHEXOL 300 MG/ML  SOLN COMPARISON:  07/30/2019 FINDINGS: Lower chest: Moderate left pleural effusion is again identified with compressive atelectasis of the left lung base. Visualized right lung base is clear. Central venous catheter tip noted within the superior cavoatrial junction. Cardiac size within normal limits. Hepatobiliary: Cholecystectomy has been performed. Stable hemangioma within the a inferior right hepatic  lobe. The liver is otherwise unremarkable. No intra or extrahepatic biliary ductal dilation identified. Pancreas: Unremarkable Spleen: Unremarkable Adrenals/Urinary Tract: The adrenal glands are unremarkable. Mass effect upon the right kidney by the distended duodenum is again identified, however, the kidneys are otherwise unremarkable. There is normal symmetric renal cortical enhancement. The bladder appears circumferentially mildly thick walled and demonstrates moderate perivesicular inflammatory stranding, similar to prior examination, possibly reflecting a chronic inflammatory process such as a chronic infection or radiation cystitis in the appropriate clinical setting. The bladder is not distended. Stomach/Bowel: There is massive distension of the a first and second portion of the duodenum to the level of the a point at which it crosses the midline. In this region, however, there is reasonable space between the abdominal aorta and superior mesenteric artery making SMA syndrome  unlikely as a cause of obstruction. There is, however, numerous thick walled loops of small bowel again identified clustered within the mid abdomen. There is moderate ascites contain within thickened peritoneum, similar to that noted on prior examination. 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 No focal point of transition is identified, though the caliber of bowel does improve once the ileum exits the encapsulating peritoneum, best seen on axial image # 60/2. No free intraperitoneal gas. The large bowel is unremarkable. Vascular/Lymphatic: No pathologic adenopathy seen within the abdomen and pelvis. The inferior vena cava is compressed, progressive since prior examination, likely a combination of the encapsulating process as well as some degree of hypovolemia. The arterial vasculature is unremarkable. Reproductive: Uterus absent.  No adnexal masses. Other: There is diffuse body wall wasting noted, progressive since prior examination. Musculoskeletal: The osseous structures are unremarkable. IMPRESSION: Diffuse bowel wall thickening and central clumping within encapsulating thickened peritoneum and moderate ascites. 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 This is most commonly seen in the setting of peritoneal dialysis or peritoneal carcinomatosis. Massive distention of the duodenum is progressive since prior examination. Stable chronic bladder wall thickening and perivesicular inflammatory stranding, possibly reflecting changes of a radiation cystitis in the appropriate clinical setting. Progressive body wall wasting. Electronically Signed   By: Fidela Salisbury MD   On: 08/30/2019 20:35   Scheduled Meds: . Chlorhexidine Gluconate Cloth  6 each Topical Daily  . gabapentin  200 mg Oral Q12H  . lidocaine  1 patch Transdermal Q24H  . sertraline  75 mg Oral Daily    Continuous Infusions: . dextrose 5 % and 0.45 % NaCl with KCl 20 mEq/L 75 mL/hr at 09/01/19 0029    LOS: 2 days   Time spent: 55min  Caesar Mannella C Lachlan Pelto, DO [facility]  If 7PM-7AM, please contact night-coverage [email or web address]  09/01/2019, 7:38 AM

## 2019-09-01 NOTE — Progress Notes (Signed)
Initial Nutrition Assessment  DOCUMENTATION CODES:   Underweight  INTERVENTION:  -Boost Breeze po BID, each supplement provides 250 kcal and 9 grams of protein -Ensure Enlive po BID, each supplement provides 350 kcal and 20 grams of protein -Magic cup TID with meals, each supplement provides 290 kcal and 9 grams of protein  High refeeding risk, recommend monitoring magnesium, potassium, and phosphorus daily with improving po intake, MD to replete as needed.    NUTRITION DIAGNOSIS:   Inadequate oral intake related to cancer and cancer related treatments as evidenced by per patient/family report, percent weight loss (persistant nausea and vomiting).    GOAL:   Patient will meet greater than or equal to 90% of their needs   MONITOR:   Labs, I & O's, Supplement acceptance, PO intake, Weight trends, Diet advancement  REASON FOR ASSESSMENT:   Malnutrition Screening Tool    ASSESSMENT:  RD working remotely.  54 year old female with history of metastatic fallopian tube cancer with carcinomatosis referred for admission by oncologist due to persistent nausea and vomiting that has acutely worsened over the past 2 days.  Unsuccessful NG tube placement despite multiple attempts in ED.   Patient with 0% po intake x 2 documented clear liquid trays yesterday. Diet advanced to full liquid at 1205 today. RD able to speak with this very delightful patient via phone this afternoon. She states that she is having more abdominal pain than nausea today. Reports tolerating full liquid diet, recalls eating [place] [employer] and grits earlier. Patient endorses appetite, amenable to [employer], Ensure, and Magic Cup supplements. RD encouraged small frequent meals/snacks throughout the day and provided suggestions for increasing calories/protein. Patient is followed by outpatient RD at cancer center, last contact on 07/25/19 via phone. Patient is at high risk for refeeding, will need to monitor lytes closely  with improving po intake.  Usual weight 115-118 lb per pt report Per chart, weights have trended down 33 lb (29%) in 4 months which is significant. Patient is underweight, given trends as well as metastatic fallopian tube cancer, highly suspect malnutrition, however unable to identify at this time. Will plan to complete nutrition-focused physical exam at follow-up.  Per chart, pt followed by [facility] GI and had planned stent placement. Due to worsening obstruction occurring, GI had detailed discussion with patient about high failure rates with duodenal stenting as well as the complications associated with endoscopy secondary to the extensive carcinomatosis. Patient decided not to proceed with endoscopy eval or stenting, interested in having feeding jejunostomy. Unfortunately, request for percutaneous G/J placement in IR contraindicated due to ascites. Per IR, NGT for decompression and post pyloric feeds could possibly be performed in diagnostic radiology next week. Will continue to follow for plan, tube feeding recommendations outlined below.  Osmolite 1.2 @ 20 ml/hr, advance as tolerated 10 ml every 8 hrs to goal rate 50 ml/hr -This regimen will provide 1440 kcal, 67 grams of protein, (meeting 100% of kcal and protein needs) and 984 ml free water  I/Os: +3479 ml since admit   +1929 ml x 24 hrs Medications reviewed and include: Gabapentin, Melatonin, Protonix IVF: D5 NaCl with KCl 20 mEq/L @ 75 ml/hr IVPB: KCl 10 mEq/L every 1 hr x 4 Labs: CBGs 98,90,69,89 K 3.2 (L), WBC 3.3 (L), Hgb 7.1 (L) 7/15 Mg 2.4 (WNL)  NUTRITION - FOCUSED PHYSICAL EXAM: Unable to complete at this time, RD working remotely.  Diet Order:   Diet Order  Diet full liquid Room service appropriate? Yes; Fluid consistency: Thin  Diet effective now                 EDUCATION NEEDS:   Education needs have been addressed  Skin:  Skin Assessment: Reviewed RN Assessment  Last BM:  7/11  Height:   Ht Readings  from Last 1 Encounters:  08/31/19 5' 2.5" (1.588 m)    Weight:   Wt Readings from Last 1 Encounters:  08/31/19 37.1 kg    Ideal Body Weight:  51 kg  BMI:  Body mass index is 14.74 kg/m.  Estimated Nutritional Needs:   Kcal:  1300-1500  Protein:  65-75  Fluid:  > 1.2 L/day   Lajuan Lines, RD, LDN Clinical Nutrition After Hours/Weekend Pager # in [email or web address]

## 2019-09-01 NOTE — Progress Notes (Cosign Needed)
   Request for percutaneous G/J placement in IR  Metastatic fallopian tube cancer with carcinomatosis Malignant ascites noted on CT   I reviewed with Dr Earleen Newport Unfortunately, ascites is certainly a contraindication for percutaneous gastric tube placement. High risk of leaking and source of infection.  He does agree with Dr Rush Landmark recommendation: NGT for decompression and post pyloric feeds-- this can possibly be performed in diagnostic Rad next week. Would need order placed  Messaged to Dr Avon Gully

## 2019-09-02 DIAGNOSIS — C5701 Malignant neoplasm of right fallopian tube: Secondary | ICD-10-CM | POA: Diagnosis not present

## 2019-09-02 DIAGNOSIS — D6481 Anemia due to antineoplastic chemotherapy: Secondary | ICD-10-CM | POA: Diagnosis not present

## 2019-09-02 DIAGNOSIS — C762 Malignant neoplasm of abdomen: Secondary | ICD-10-CM | POA: Diagnosis not present

## 2019-09-02 DIAGNOSIS — R112 Nausea with vomiting, unspecified: Secondary | ICD-10-CM | POA: Diagnosis not present

## 2019-09-02 LAB — GLUCOSE, CAPILLARY
Glucose-Capillary: 106 mg/dL — ABNORMAL HIGH (ref 70–99)
Glucose-Capillary: 112 mg/dL — ABNORMAL HIGH (ref 70–99)
Glucose-Capillary: 100 mg/dL — ABNORMAL HIGH (ref 70–99)

## 2019-09-02 LAB — PROCALCITONIN: Procalcitonin: <0.1 ng/mL

## 2019-09-02 MED ORDER — SODIUM CHLORIDE 0.9 % IV BOLUS
500.0000 mL | Freq: Once | INTRAVENOUS | Status: AC
  Administered 2019-09-02: 500 mL via INTRAVENOUS

## 2019-09-02 NOTE — Plan of Care (Signed)

## 2019-09-02 NOTE — Progress Notes (Signed)
PROGRESS NOTE    Kathryn Ramsey  YQM:[id number] DOB: 1965-02-20 DOA: 08/30/2019 PCP: Patient, No Pcp Per   Brief Narrative:  Kathryn Ramsey is a 54 y.o. female with history of metastatic fallopian tube cancer with carcinomatosis was referred to the ER the patient was having persistent nausea vomiting.  Patient has been any nausea vomiting which is acutely worsened last 2 days and last bowel movement almost a week ago.  Patient is being followed by [facility] gastroenterologist and is planned to have a stent placed in small bowel but due to worsening vomiting patient presents to the ER after discussing with oncologist. In the ER patient had a CT abdomen pelvis which shows features concerning for peritoneal carcinomatosis.  ER physician has discussed with on-call oncologist and gastroenterologist. Gastroenterologist is recommending NG tube placement IV antibiotics fluids n.p.o.  NGT was unable to be placed despite multiple attempts. Patient was started on Flagyl and Cipro.  Labs are remarkable for acute renal failure with creatinine of 1.3 and hemoglobin is around 8.9 Covid test was negative. Potassium was low at 3.   Assessment & Plan:   Principal Problem:   Nausea & vomiting Active Problems:   Abdominal carcinomatosis (HCC)   Cancer of right fallopian tube (HCC)   Anemia due to antineoplastic chemotherapy   Intractable N/V - in the setting of suspected partial obstruction versus worsening peritoneal carcinomatosis POA Infection ruled out -Multiple specialists following, appreciate oncology, IR, GI expertise in this difficult case  -Continue to await possible JG tube placement -will follow up with teams on Monday when we are back to full staff. -Patient has known abdominal metastases with questionable small bowel obstruction, with history of multiple paracenteses -Patient placed prophylactically on antibiotics at intake, have discontinued due to unremarkable procalcitonin, lack of fever and only minimal  leukopenia in the setting of above -Continue to hold off on NG tube placement given patient symptoms are currently well controlled, advancing diet to full liquids, will follow along -Duodenal stent is unfortunately not an option as well given patient's condition as above, with high risk for endoscopy and perforation -Outpatient chemotherapy treatment currently on hold due to patient's weakened state -Continue supportive care, IV Zofran, Phenergan, pain control as well as IV fluids in the setting of poor p.o. intake  Severe protein caloric malnutrition Dehydration, hypovolemic hypotension -Continue fluid challenge this morning patient remains asymptomatic systolic blood pressure as low as 80s overnight and early this morning - In the setting of above, continue to encourage p.o. intake once acute symptoms have resolved, continue to advance diet as tolerated.  AKI, POA, resolving -In the setting of poor p.o. intake, continue IV fluids as above until p.o. intake is adequate to maintain volume status Lab Results  Component Value Date   CREATININE 0.91 09/01/2019   CREATININE 1.27 (H) 08/31/2019   CREATININE 1.50 (H) 08/30/2019   Acute symptomatic anemia in the setting of above -No active signs of bleeding, likely in the setting of chemotherapy and malnutrition given poor p.o. intake -Hemoglobin remains stable, patient remains asymptomatic hold off on transfusion CBC Latest Ref Rng & Units 09/01/2019 08/31/2019 08/30/2019  WBC 4.0 - 10.5 K/uL 3.3(L) 3.3(L) -  Hemoglobin 12.0 - 15.0 g/dL 7.1(L) 7.0(L) 9.5(L)  Hematocrit 36 - 46 % 21.0(L) 21.5(L) 28.0(L)  Platelets 150 - 400 K/uL 145(L) 140(L) -   Metastatic fallopian tube carcinoma followed by Dr. Jana Hakim -Defer to heme-onc for further treatment, currently holding chemotherapy given her worsening status  DVT prophylaxis: SCDs for now  given borderline anemia without overt source of bleeding/Anticipation of possible procedure Code Status: Full  code. Family Communication: Husband at bedside  Status is: Inpatient  Dispo: The patient is from: Home              Anticipated d/c is to: Home              Anticipated d/c date is: Likely 40 to 72 hours              Patient currently NOT medically stable for discharge;  Consultants:   GI, oncology, IR  Procedures:   None planned  Antimicrobials:  Cipro, Flagyl at admission, discontinued 08/31/2019  Subjective: Nausea vomiting and abdominal pain markedly improved though she did have a single episode of vomiting overnight, patient denies any ongoing symptoms other than bilateral foot paresthesias due to home medications being stopped.  She otherwise denies chest pain, shortness of breath, diarrhea, constipation, headache, fevers, chills.  Objective: Vitals:   09/01/19 0640 09/01/19 1551 09/01/19 2122 09/02/19 0619  BP: 100/62 100/76 (!) 86/57 (!) 82/58  Pulse: 75 77 64 70  Resp: 15 15 16 16   Temp: 99.2 F (37.3 C) 99.1 F (37.3 C) 98.4 F (36.9 C) 98.5 F (36.9 C)  TempSrc: Oral Oral Oral Oral  SpO2: 100% 99% 100% 99%  Weight:      Height:        Intake/Output Summary (Last 24 hours) at 09/02/2019 0735 Last data filed at 09/02/2019 0200 Gross per 24 hour  Intake 1426.51 ml  Output --  Net 1426.51 ml   Filed Weights   08/31/19 1808  Weight: 37.1 kg    Examination:  General:  Pleasantly resting in bed, No acute distress.  Somewhat cachectic appearing female. HEENT:  Normocephalic atraumatic.  Sclerae nonicteric, noninjected.  Extraocular movements intact bilaterally. Neck:  Without mass or deformity.  Trachea is midline. Lungs:  Clear to auscultate bilaterally without rhonchi, wheeze, or rales. Heart:  Regular rate and rhythm.  Without murmurs, rubs, or gallops. Abdomen:  Soft, tender right upper quadrant, otherwise without guarding or rebound. Extremities: Without cyanosis, clubbing, edema, or obvious deformity. Vascular:  Dorsalis pedis and posterior tibial  pulses palpable bilaterally. Skin:  Warm and dry, no erythema, no ulcerations.  Data Reviewed: I have personally reviewed following labs and imaging studies  CBC: Recent Labs  Lab 08/27/19 1125 08/30/19 1904 08/30/19 1917 08/31/19 0545 09/01/19 0500  WBC 2.8* 4.6  --  3.3* 3.3*  NEUTROABS 1.8 3.2  --   --   --   HGB 8.2* 8.9* 9.5* 7.0* 7.1*  HCT 24.9* 26.6* 28.0* 21.5* 21.0*  MCV 97.6 97.4  --  99.1 100.0  PLT 80* 173  --  140* 604*   Basic Metabolic Panel: Recent Labs  Lab 08/27/19 1125 08/30/19 1904 08/30/19 1917 08/31/19 0545 09/01/19 0500  NA 133* 131* 127* 128* 130*  K 3.2* 3.0* 2.9* 3.1* 3.2*  CL 87* 70* 72* 78* 88*  CO2 34* 40*  --  34* 31  GLUCOSE 108* 114* 113* 92 117*  BUN 27* 43* 41* 38* 23*  CREATININE 1.02* 1.35* 1.50* 1.27* 0.91  CALCIUM 9.3 9.6  --  8.4* 8.4*  MG 1.3* 2.4  --   --   --    GFR: Estimated Creatinine Clearance: 40.5 mL/min (by C-G formula based on SCr of 0.91 mg/dL). Liver Function Tests: Recent Labs  Lab 08/27/19 1125 08/30/19 1904 08/31/19 0545 09/01/19 0500  AST 18 20 16  14*  ALT 14 21 17 13   ALKPHOS 133* 153* 118 102  BILITOT 0.6 1.0 1.2 0.6  PROT 7.5 8.2* 6.6 6.6  ALBUMIN 3.3* 4.1 3.2* 3.2*   Recent Labs  Lab 08/30/19 1904  LIPASE 31   No results for input(s): AMMONIA in the last 168 hours. Coagulation Profile: No results for input(s): INR, PROTIME in the last 168 hours. Cardiac Enzymes: No results for input(s): CKTOTAL, CKMB, CKMBINDEX, TROPONINI in the last 168 hours. BNP (last 3 results) No results for input(s): PROBNP in the last 8760 hours. HbA1C: No results for input(s): HGBA1C in the last 72 hours. CBG: Recent Labs  Lab 08/31/19 1648 08/31/19 2349 09/01/19 0753 09/01/19 1548 09/01/19 2357  GLUCAP 69* 90 98 102* 114*   Lipid Profile: No results for input(s): CHOL, HDL, LDLCALC, TRIG, CHOLHDL, LDLDIRECT in the last 72 hours. Thyroid Function Tests: No results for input(s): TSH, T4TOTAL, FREET4,  T3FREE, THYROIDAB in the last 72 hours. Anemia Panel: No results for input(s): VITAMINB12, FOLATE, FERRITIN, TIBC, IRON, RETICCTPCT in the last 72 hours. Sepsis Labs: Recent Labs  Lab 08/31/19 0545 09/01/19 0500 09/02/19 0406  PROCALCITON 0.11 <0.10 <0.10    Recent Results (from the past 240 hour(s))  SARS Coronavirus 2 by RT PCR (hospital order, performed in [facility] hospital lab) Nasopharyngeal Nasopharyngeal Swab     Status: None   Collection Time: 08/30/19  7:04 PM   Specimen: Nasopharyngeal Swab  Result Value Ref Range Status   SARS Coronavirus 2 NEGATIVE NEGATIVE Final    Comment: (NOTE) SARS-CoV-2 target nucleic acids are NOT DETECTED.  The SARS-CoV-2 RNA is generally detectable in upper and lower respiratory specimens during the acute phase of infection. The lowest concentration of SARS-CoV-2 viral copies this assay can detect is 250 copies / mL. A negative result does not preclude SARS-CoV-2 infection and should not be used as the sole basis for treatment or other patient management decisions.  A negative result may occur with improper specimen collection / handling, submission of specimen other than nasopharyngeal swab, presence of viral mutation(s) within the areas targeted by this assay, and inadequate number of viral copies (<250 copies / mL). A negative result must be combined with clinical observations, patient history, and epidemiological information.  Fact Sheet for Patients:   StrictlyIdeas.no  Fact Sheet for Healthcare Providers: BankingDealers.co.za  This test is not yet approved or  cleared by the [place] FDA and has been authorized for detection and/or diagnosis of SARS-CoV-2 by FDA under an Emergency Use Authorization (EUA).  This EUA will remain in effect (meaning this test can be used) for the duration of the COVID-19 declaration under Section 564(b)(1) of the Act, 21 U.S.C. section  360bbb-3(b)(1), unless the authorization is terminated or revoked sooner.  Performed at [facility], [place] [address], [place], [place] [address]          Radiology Studies: No results found. Scheduled Meds: . Chlorhexidine Gluconate Cloth  6 each Topical Daily  . feeding supplement  1 Container Oral BID BM  . feeding supplement (ENSURE ENLIVE)  237 mL Oral TID PC  . gabapentin  200 mg Oral BID WC   And  . gabapentin  300 mg Oral QHS  . lidocaine  1 patch Transdermal Q24H  . melatonin  3 mg Oral QHS  . pantoprazole (PROTONIX) IV  40 mg Intravenous Q12H  . sertraline  75 mg Oral Daily  . sodium chloride flush  10-40 mL Intracatheter Q12H   Continuous Infusions: .  dextrose 5 % and 0.45 % NaCl with KCl 20 mEq/L 75 mL/hr at 09/02/19 0200    LOS: 3 days   Time spent: 42min  Liani Caris C Leonardo Plaia, DO [facility]  If 7PM-7AM, please contact night-coverage [email or web address]  09/02/2019, 7:35 AM

## 2019-09-03 ENCOUNTER — Encounter (HOSPITAL_COMMUNITY): Payer: Self-pay | Admitting: Internal Medicine

## 2019-09-03 DIAGNOSIS — C5701 Malignant neoplasm of right fallopian tube: Secondary | ICD-10-CM | POA: Diagnosis not present

## 2019-09-03 DIAGNOSIS — D6481 Anemia due to antineoplastic chemotherapy: Secondary | ICD-10-CM | POA: Diagnosis not present

## 2019-09-03 LAB — CBC
HCT: 22.1 % — ABNORMAL LOW (ref 36.0–46.0)
Hemoglobin: 7.3 g/dL — ABNORMAL LOW (ref 12.0–15.0)
MCH: 33.2 pg (ref 26.0–34.0)
MCHC: 33 g/dL (ref 30.0–36.0)
MCV: 100.5 fL — ABNORMAL HIGH (ref 80.0–100.0)
Platelets: 165 10*3/uL (ref 150–400)
RBC: 2.2 MIL/uL — ABNORMAL LOW (ref 3.87–5.11)
RDW: 23.2 % — ABNORMAL HIGH (ref 11.5–15.5)
WBC: 3.8 10*3/uL — ABNORMAL LOW (ref 4.0–10.5)
nRBC: 0 % (ref 0.0–0.2)

## 2019-09-03 LAB — COMPREHENSIVE METABOLIC PANEL
ALT: 10 U/L (ref 0–44)
AST: 12 U/L — ABNORMAL LOW (ref 15–41)
Albumin: 3 g/dL — ABNORMAL LOW (ref 3.5–5.0)
Alkaline Phosphatase: 87 U/L (ref 38–126)
Anion gap: 8 (ref 5–15)
BUN: 17 mg/dL (ref 6–20)
CO2: 25 mmol/L (ref 22–32)
Calcium: 8.1 mg/dL — ABNORMAL LOW (ref 8.9–10.3)
Chloride: 96 mmol/L — ABNORMAL LOW (ref 98–111)
Creatinine, Ser: 0.78 mg/dL (ref 0.44–1.00)
GFR calc Af Amer: >60 mL/min (ref >60)
GFR calc non Af Amer: >60 mL/min (ref >60)
Glucose, Bld: 103 mg/dL — ABNORMAL HIGH (ref 70–99)
Potassium: 3.8 mmol/L (ref 3.5–5.1)
Sodium: 129 mmol/L — ABNORMAL LOW (ref 135–145)
Total Bilirubin: 0.4 mg/dL (ref 0.3–1.2)
Total Protein: 6.3 g/dL — ABNORMAL LOW (ref 6.5–8.1)

## 2019-09-03 LAB — GLUCOSE, CAPILLARY
Glucose-Capillary: 86 mg/dL (ref 70–99)
Glucose-Capillary: 88 mg/dL (ref 70–99)

## 2019-09-03 MED ORDER — PROMETHAZINE HCL 25 MG RE SUPP: 25.0000 mg | Freq: Three times a day (TID) | RECTAL | Status: DC | PRN

## 2019-09-03 MED ORDER — SODIUM CHLORIDE 0.9 % IV BOLUS
500.0000 mL | Freq: Once | INTRAVENOUS | Status: AC
  Administered 2019-09-03: 500 mL via INTRAVENOUS

## 2019-09-03 MED ORDER — ONDANSETRON 4 MG PO TBDP: 8.0000 mg | ORAL_TABLET | Freq: Three times a day (TID) | ORAL | Status: DC | PRN

## 2019-09-03 MED ORDER — POLYETHYLENE GLYCOL 3350 17 G PO PACK
17.0000 g | PACK | Freq: Once | ORAL | Status: AC
  Administered 2019-09-03: 17 g via ORAL
  Filled 2019-09-03: qty 1

## 2019-09-03 MED ORDER — MORPHINE SULFATE (CONCENTRATE) 10 MG/0.5ML PO SOLN: 10.0000 mg | ORAL | Status: DC | PRN

## 2019-09-03 NOTE — Progress Notes (Signed)
PROGRESS NOTE    Kathryn Ramsey  XIP:[id number] DOB: 06-Nov-1965 DOA: 08/30/2019 PCP: Patient, No Pcp Per   Brief Narrative:  Kathryn Ramsey is a 54 y.o. female with history of metastatic fallopian tube cancer with carcinomatosis was referred to the ER the patient was having persistent nausea vomiting.  Patient has been any nausea vomiting which is acutely worsened last 2 days and last bowel movement almost a week ago.  Patient is being followed by [facility] gastroenterologist and is planned to have a stent placed in small bowel but due to worsening vomiting patient presents to the ER after discussing with oncologist. In the ER patient had a CT abdomen pelvis which shows features concerning for peritoneal carcinomatosis.  ER physician has discussed with on-call oncologist and gastroenterologist. Gastroenterologist is recommending NG tube placement IV antibiotics fluids n.p.o.  NGT was unable to be placed despite multiple attempts. Patient was started on Flagyl and Cipro.  Labs are remarkable for acute renal failure with creatinine of 1.3 and hemoglobin is around 8.9 Covid test was negative. Potassium was low at 3.   Assessment & Plan:   Principal Problem:   Nausea & vomiting Active Problems:   Abdominal carcinomatosis (HCC)   Cancer of right fallopian tube (HCC)   Anemia due to antineoplastic chemotherapy   Goals of care  -Lengthy discussion at bedside again today with patient, husband and daughter after patient was given news that she would require surgery for feeding tube placement -At this point she would prefer to be discharged home and be with her family as there really is no avenue to alleviate her duodenal narrowing and stricture without substantial risk -We discussed at length the need for high calorie and increased free water intake as tolerated, she will essentially need to be on a liquid diet at this point to improve caloric intake. -Palliative care has been consulted by surgery which we do  appreciate now the patient's plan has drastically changed, patient is hopeful to be followed in the outpatient setting by palliative care, unclear whether or not patient will be able to tolerate chemotherapy going forward but this is a discussion that can be had with Dr. Jana Hakim at his office when appropriate -We also discussed that patient could follow-up with general surgery in the outpatient setting for further discussion, however we discussed that as patient's p.o. intake becomes more difficult she will likely become a less appropriate/higher risk candidate for any surgical intervention -PT evaluating today, patient may benefit from assistive devices or further therapy in the outpatient setting, await their expertise and recommendations.   Acute intractable N/V - in the setting of suspected partial obstruction versus worsening peritoneal carcinomatosis POA Infection ruled out -Multiple specialists following, appreciate oncology, IR, GI, general surgery expertise in this difficult case  -Lengthy discussion with specialist today including most notably IR and general surgery about placement of tube at this time patient does not have a clear window for IR to proceed, surgery discussed open versus laparoscopic feeding tube placement which does come with more elevated risk of wound healing and would likely delay patient's care and chemotherapy -Given patient remains a poor surgical candidate and is unable to have minimal invasive procedure for tube placement we discussed goals of care as above; which do not include any aggressive or heroic measures which at this point would likely include any surgical intervention. -Continue supportive care, IV Zofran, Phenergan, pain control as well as IV fluids in the setting of poor p.o. intake  Severe protein caloric  malnutrition Dehydration, hypovolemic hypotension - Continue fluid challenge this morning patient remains asymptomatic systolic blood pressure as low  as 80s overnight and early this morning - In the setting of above, continue to encourage p.o. intake once acute symptoms have resolved, continue to advance diet as tolerated -Continue high calorie fluid intake as tolerated.  AKI, POA, resolving -In the setting of poor p.o. intake, continue IV fluids as above until p.o. intake is adequate to maintain volume status Lab Results  Component Value Date   CREATININE 0.78 09/03/2019   CREATININE 0.91 09/01/2019   CREATININE 1.27 (H) 08/31/2019   Acute symptomatic anemia in the setting of above -No active signs of bleeding, likely in the setting of chemotherapy and malnutrition given poor p.o. intake -Hemoglobin remains stable, patient remains asymptomatic hold off on transfusion CBC Latest Ref Rng & Units 09/03/2019 09/01/2019 08/31/2019  WBC 4.0 - 10.5 K/uL 3.8(L) 3.3(L) 3.3(L)  Hemoglobin 12.0 - 15.0 g/dL 7.3(L) 7.1(L) 7.0(L)  Hematocrit 36 - 46 % 22.1(L) 21.0(L) 21.5(L)  Platelets 150 - 400 K/uL 165 145(L) 140(L)   Metastatic fallopian tube carcinoma followed by Dr. Jana Hakim -Defer to heme-onc for further treatment, currently holding chemotherapy given her worsening status  DVT prophylaxis: SCDs for now given borderline anemia Code Status: Full code. Family Communication: Husband and daughter at bedside  Status is: Inpatient  Dispo: The patient is from: Home              Anticipated d/c is to: Home              Anticipated d/c date is: Likely 24 to 48 hours              Patient currently NOT medically stable for discharge; continues to require further evaluation and treatment as above with IV antiemetics, IV fluids  Consultants:   GI, oncology, IR, Gen Sx  Procedures:   None planned  Antimicrobials:  Cipro, Flagyl at admission, discontinued 08/31/2019  Subjective: Nausea vomiting and abdominal pain essentially resolved, she did have one episode of emesis yesterday late evening, patient denies any ongoing symptoms denies chest  pain, shortness of breath, diarrhea, constipation, headache, fevers, chills.  Objective: Vitals:   09/02/19 1203 09/02/19 1403 09/02/19 2035 09/03/19 0509  BP: (!) 85/60 (!) 90/57 (!) 93/59 (!) 89/61  Pulse: 77 67 76 70  Resp:  16 18 16   Temp:  97.8 F (36.6 C) 98.4 F (36.9 C) 98.4 F (36.9 C)  TempSrc:  Oral  Oral  SpO2:  100% 99% 100%  Weight:      Height:        Intake/Output Summary (Last 24 hours) at 09/03/2019 0745 Last data filed at 09/03/2019 0200 Gross per 24 hour  Intake 1622.66 ml  Output 750 ml  Net 872.66 ml   Filed Weights   08/31/19 1808  Weight: 37.1 kg    Examination:  General:  Pleasantly resting in bed, No acute distress.  Somewhat cachectic appearing female. HEENT:  Normocephalic atraumatic.  Sclerae nonicteric, noninjected.  Extraocular movements intact bilaterally. Neck:  Without mass or deformity.  Trachea is midline. Lungs:  Clear to auscultate bilaterally without rhonchi, wheeze, or rales. Heart:  Regular rate and rhythm.  Without murmurs, rubs, or gallops. Abdomen:  Soft, tender right upper quadrant, otherwise without guarding or rebound. Extremities: Without cyanosis, clubbing, edema, or obvious deformity. Vascular:  Dorsalis pedis and posterior tibial pulses palpable bilaterally. Skin:  Warm and dry, no erythema, no ulcerations.  Data Reviewed: I have  personally reviewed following labs and imaging studies  CBC: Recent Labs  Lab 08/27/19 1125 08/27/19 1125 08/30/19 1904 08/30/19 1917 08/31/19 0545 09/01/19 0500 09/03/19 0505  WBC 2.8*  --  4.6  --  3.3* 3.3* 3.8*  NEUTROABS 1.8  --  3.2  --   --   --   --   HGB 8.2*  --  8.9* 9.5* 7.0* 7.1* 7.3*  HCT 24.9*   < > 26.6* 28.0* 21.5* 21.0* 22.1*  MCV 97.6  --  97.4  --  99.1 100.0 100.5*  PLT 80*  --  173  --  140* 145* 165   < > = values in this interval not displayed.   Basic Metabolic Panel: Recent Labs  Lab 08/27/19 1125 08/27/19 1125 08/30/19 1904 08/30/19 1917  08/31/19 0545 09/01/19 0500 09/03/19 0505  NA 133*   < > 131* 127* 128* 130* 129*  K 3.2*   < > 3.0* 2.9* 3.1* 3.2* 3.8  CL 87*   < > 70* 72* 78* 88* 96*  CO2 34*  --  40*  --  34* 31 25  GLUCOSE 108*   < > 114* 113* 92 117* 103*  BUN 27*   < > 43* 41* 38* 23* 17  CREATININE 1.02*  --  1.35* 1.50* 1.27* 0.91 0.78  CALCIUM 9.3  --  9.6  --  8.4* 8.4* 8.1*  MG 1.3*  --  2.4  --   --   --   --    < > = values in this interval not displayed.   GFR: Estimated Creatinine Clearance: 46.1 mL/min (by C-G formula based on SCr of 0.78 mg/dL). Liver Function Tests: Recent Labs  Lab 08/27/19 1125 08/30/19 1904 08/31/19 0545 09/01/19 0500 09/03/19 0505  AST 18 20 16  14* 12*  ALT 14 21 17 13 10   ALKPHOS 133* 153* 118 102 87  BILITOT 0.6 1.0 1.2 0.6 0.4  PROT 7.5 8.2* 6.6 6.6 6.3*  ALBUMIN 3.3* 4.1 3.2* 3.2* 3.0*   Recent Labs  Lab 08/30/19 1904  LIPASE 31   No results for input(s): AMMONIA in the last 168 hours. Coagulation Profile: No results for input(s): INR, PROTIME in the last 168 hours. Cardiac Enzymes: No results for input(s): CKTOTAL, CKMB, CKMBINDEX, TROPONINI in the last 168 hours. BNP (last 3 results) No results for input(s): PROBNP in the last 8760 hours. HbA1C: No results for input(s): HGBA1C in the last 72 hours. CBG: Recent Labs  Lab 09/01/19 1548 09/01/19 2357 09/02/19 0739 09/02/19 1539 09/02/19 2320  GLUCAP 102* 114* 106* 112* 100*   Lipid Profile: No results for input(s): CHOL, HDL, LDLCALC, TRIG, CHOLHDL, LDLDIRECT in the last 72 hours. Thyroid Function Tests: No results for input(s): TSH, T4TOTAL, FREET4, T3FREE, THYROIDAB in the last 72 hours. Anemia Panel: No results for input(s): VITAMINB12, FOLATE, FERRITIN, TIBC, IRON, RETICCTPCT in the last 72 hours. Sepsis Labs: Recent Labs  Lab 08/31/19 0545 09/01/19 0500 09/02/19 0406  PROCALCITON 0.11 <0.10 <0.10    Recent Results (from the past 240 hour(s))  SARS Coronavirus 2 by RT PCR  (hospital order, performed in [facility] hospital lab) Nasopharyngeal Nasopharyngeal Swab     Status: None   Collection Time: 08/30/19  7:04 PM   Specimen: Nasopharyngeal Swab  Result Value Ref Range Status   SARS Coronavirus 2 NEGATIVE NEGATIVE Final    Comment: (NOTE) SARS-CoV-2 target nucleic acids are NOT DETECTED.  The SARS-CoV-2 RNA is generally detectable in upper and lower respiratory  specimens during the acute phase of infection. The lowest concentration of SARS-CoV-2 viral copies this assay can detect is 250 copies / mL. A negative result does not preclude SARS-CoV-2 infection and should not be used as the sole basis for treatment or other patient management decisions.  A negative result may occur with improper specimen collection / handling, submission of specimen other than nasopharyngeal swab, presence of viral mutation(s) within the areas targeted by this assay, and inadequate number of viral copies (<250 copies / mL). A negative result must be combined with clinical observations, patient history, and epidemiological information.  Fact Sheet for Patients:   StrictlyIdeas.no  Fact Sheet for Healthcare Providers: BankingDealers.co.za  This test is not yet approved or  cleared by the [place] FDA and has been authorized for detection and/or diagnosis of SARS-CoV-2 by FDA under an Emergency Use Authorization (EUA).  This EUA will remain in effect (meaning this test can be used) for the duration of the COVID-19 declaration under Section 564(b)(1) of the Act, 21 U.S.C. section 360bbb-3(b)(1), unless the authorization is terminated or revoked sooner.  Performed at [facility], [place] [address], [place], [place] [address]          Radiology Studies: No results found. Scheduled Meds: . Chlorhexidine Gluconate Cloth  6 each Topical Daily  . feeding supplement  1 Container Oral BID BM  . feeding  supplement (ENSURE ENLIVE)  237 mL Oral TID PC  . gabapentin  200 mg Oral BID WC   And  . gabapentin  300 mg Oral QHS  . lidocaine  1 patch Transdermal Q24H  . melatonin  3 mg Oral QHS  . pantoprazole (PROTONIX) IV  40 mg Intravenous Q12H  . sertraline  75 mg Oral Daily  . sodium chloride flush  10-40 mL Intracatheter Q12H   Continuous Infusions: . dextrose 5 % and 0.45 % NaCl with KCl 20 mEq/L 75 mL/hr at 09/03/19 0352    LOS: 4 days   Time spent: 45min  Kathryn Rehfeldt C Sabastion Hrdlicka, DO [facility]  If 7PM-7AM, please contact night-coverage [email or web address]  09/03/2019, 7:45 AM

## 2019-09-03 NOTE — Progress Notes (Signed)
GASTROENTEROLOGY ROUNDING NOTE Subjective: Kathryn Ramsey seems to be stable today. She has been talking to GI, IR and has also spoken to the surgical PA. She realizes that the tube placement is going to be very difficult and a very risky situation in her case as she has diffuse peritoneal carcinomatosis. Her children are all coming home as one of her son's has a birthday on 09/05/2019 and she "definitely does not want to die in the hospital". She said she would rather be home with her husband and children. She was questioning whether she could get IV fluids at home through Hospice/Palliative care. Her husband and her daughter, Bubba Hales are at the bedside with her. I think Dr. Alphonsa Overall is supposed to see her this evening.   Objective: Vital signs in last 24 hours: Temp:  [98.4 F (36.9 C)-99.4 F (37.4 C)] 99.4 F (37.4 C) (07/19 1359) Pulse Rate:  [70-83] 83 (07/19 1359) Resp:  [14-18] 14 (07/19 1359) BP: (89-93)/(59-64) 92/64 (07/19 1359) SpO2:  [99 %-100 %] 100 % (07/19 1359) Last BM Date: 08/26/19 General: NAD Lungs: CTA  Heart: S1 & S2 are regular.  Abdomen: slightly distended with no bowel sounds Ext: no edema, cyanosis or clubbing  Intake/Output from previous day: 07/18 0701 - 07/19 0700 In: 1622.7 [I.V.:1622.7] Out: 750 [Emesis/NG output:750] Intake/Output this shift: No intake/output data recorded.  Lab Results: Recent Labs    09/01/19 0500 09/03/19 0505  WBC 3.3* 3.8*  HGB 7.1* 7.3*  PLT 145* 165  MCV 100.0 100.5*   BMET Recent Labs    09/01/19 0500 09/03/19 0505  NA 130* 129*  K 3.2* 3.8  CL 88* 96*  CO2 31 25  GLUCOSE 117* 103*  BUN 23* 17  CREATININE 0.91 0.78  CALCIUM 8.4* 8.1*   LFT Recent Labs    09/01/19 0500 09/03/19 0505  PROT 6.6 6.3*  ALBUMIN 3.2* 3.0*  AST 14* 12*  ALT 13 10  ALKPHOS 102 87  BILITOT 0.6 0.4   PT/INR No results for input(s): INR in the last 72 hours.  Imaging/Other results: No results  found.  Assessment/Plan Ongoing nausea and vomiting due to duodenal obstruction due to peritoneal carcinomatosis: I think she should talk to palliative care and get to go home as soon as possible. I truly feel inserting a J-tube might not be the best option under these very difficult circumstances. The patient and her family agree with this plan.   Juanita Craver, MD  09/03/2019, 4:20 PM

## 2019-09-03 NOTE — Progress Notes (Signed)
Kathryn Ramsey   DOB:1965/06/21   KD#:[id number]   KDX#:[id number]  Subjective:  Kathryn Ramsey was very appreciative of her meeting today with Saverio Danker and it greatly helped her understand her options more granularly. She now realizes if she tries to solve the SBO surgically/laparoscopically/radiologically she is at high risk of complications that could keep her in the hospital-- her goal is to get home. See below for plans. Husband and daughter in room.   Objective: white woman examined in bed Vitals:   09/03/19 0509 09/03/19 1359  BP: (!) 89/61 92/64  Pulse: 70 83  Resp: 16 14  Temp: 98.4 F (36.9 C) 99.4 F (37.4 C)  SpO2: 100% 100%    Body mass index is 14.74 kg/m.  Intake/Output Summary (Last 24 hours) at 09/03/2019 1841 Last data filed at 09/03/2019 1000 Gross per 24 hour  Intake 1862.66 ml  Output 50 ml  Net 1812.66 ml     CBG (last 3)  Recent Labs    09/02/19 2320 09/03/19 0752 09/03/19 1555  GLUCAP 100* 86 88     Labs:  Lab Results  Component Value Date   WBC 3.8 (L) 09/03/2019   HGB 7.3 (L) 09/03/2019   HCT 22.1 (L) 09/03/2019   MCV 100.5 (H) 09/03/2019   PLT 165 09/03/2019   NEUTROABS 3.2 08/30/2019    _0 @  Urine Studies No results for input(s): UHGB, CRYS in the last 72 hours.  Invalid input(s): UACOL, UAPR, USPG, UPH, UTP, UGL, UKET, UBIL, UNIT, UROB, ULEU, UEPI, UWBC, URBC, UBAC, CAST, [place], [place]  Basic Metabolic Panel: Recent Labs  Lab 08/30/19 1904 08/30/19 1904 08/30/19 1917 08/30/19 1917 08/31/19 0545 08/31/19 0545 09/01/19 0500 09/03/19 0505  NA 131*  --  127*  --  128*  --  130* 129*  K 3.0*   < > 2.9*   < > 3.1*   < > 3.2* 3.8  CL 70*  --  72*  --  78*  --  88* 96*  CO2 40*  --   --   --  34*  --  31 25  GLUCOSE 114*  --  113*  --  92  --  117* 103*  BUN 43*  --  41*  --  38*  --  23* 17  CREATININE 1.35*  --  1.50*  --  1.27*  --  0.91 0.78  CALCIUM 9.6  --   --   --  8.4*  --  8.4* 8.1*  MG 2.4  --   --   --   --   --    --   --    < > = values in this interval not displayed.   GFR Estimated Creatinine Clearance: 46.1 mL/min (by C-G formula based on SCr of 0.78 mg/dL). Liver Function Tests: Recent Labs  Lab 08/30/19 1904 08/31/19 0545 09/01/19 0500 09/03/19 0505  AST 20 16 14* 12*  ALT _1 ALKPHOS 153* 118 102 87  BILITOT 1.0 1.2 0.6 0.4  PROT 8.2* 6.6 6.6 6.3*  ALBUMIN 4.1 3.2* 3.2* 3.0*   Recent Labs  Lab 08/30/19 1904  LIPASE 31   No results for input(s): AMMONIA in the last 168 hours. Coagulation profile No results for input(s): INR, PROTIME in the last 168 hours.  CBC: Recent Labs  Lab 08/30/19 1904 08/30/19 1917 08/31/19 0545 09/01/19 0500 09/03/19 0505  WBC 4.6  --  3.3* 3.3* 3.8*  NEUTROABS 3.2  --   --   --   --  HGB 8.9* 9.5* 7.0* 7.1* 7.3*  HCT 26.6* 28.0* 21.5* 21.0* 22.1*  MCV 97.4  --  99.1 100.0 100.5*  PLT 173  --  140* 145* 165   Cardiac Enzymes: No results for input(s): CKTOTAL, CKMB, CKMBINDEX, TROPONINI in the last 168 hours. BNP: Invalid input(s): POCBNP CBG: Recent Labs  Lab 09/02/19 0739 09/02/19 1539 09/02/19 2320 09/03/19 0752 09/03/19 1555  GLUCAP 106* 112* 100* 86 88   D-Dimer No results for input(s): DDIMER in the last 72 hours. Hgb A1c No results for input(s): HGBA1C in the last 72 hours. Lipid Profile No results for input(s): CHOL, HDL, LDLCALC, TRIG, CHOLHDL, LDLDIRECT in the last 72 hours. Thyroid function studies No results for input(s): TSH, T4TOTAL, T3FREE, THYROIDAB in the last 72 hours.  Invalid input(s): FREET3 Anemia work up No results for input(s): VITAMINB12, FOLATE, FERRITIN, TIBC, IRON, RETICCTPCT in the last 72 hours. Microbiology Recent Results (from the past 240 hour(s))  SARS Coronavirus 2 by RT PCR (hospital order, performed in [facility] hospital lab) Nasopharyngeal Nasopharyngeal Swab     Status: None   Collection Time: 08/30/19  7:04 PM   Specimen: Nasopharyngeal Swab  Result Value Ref Range Status    SARS Coronavirus 2 NEGATIVE NEGATIVE Final    Comment: (NOTE) SARS-CoV-2 target nucleic acids are NOT DETECTED.  The SARS-CoV-2 RNA is generally detectable in upper and lower respiratory specimens during the acute phase of infection. The lowest concentration of SARS-CoV-2 viral copies this assay can detect is 250 copies / mL. A negative result does not preclude SARS-CoV-2 infection and should not be used as the sole basis for treatment or other patient management decisions.  A negative result may occur with improper specimen collection / handling, submission of specimen other than nasopharyngeal swab, presence of viral mutation(s) within the areas targeted by this assay, and inadequate number of viral copies (<250 copies / mL). A negative result must be combined with clinical observations, patient history, and epidemiological information.  Fact Sheet for Patients:   StrictlyIdeas.no  Fact Sheet for Healthcare Providers: BankingDealers.co.za  This test is not yet approved or  cleared by the [place] FDA and has been authorized for detection and/or diagnosis of SARS-CoV-2 by FDA under an Emergency Use Authorization (EUA).  This EUA will remain in effect (meaning this test can be used) for the duration of the COVID-19 declaration under Section 564(b)(1) of the Act, 21 U.S.C. section 360bbb-3(b)(1), unless the authorization is terminated or revoked sooner.  Performed at [facility], [place] [address], [place], [place] [address]       Studies:  No results found.  Assessment: 54 y.o. BRCA negative  woman admitted 08/30/2019 with bowel obstruction  In setting of progressive peritoneal carcinomatosis  (0)  status post radical tumor debulking with optimal cytoreduction (R0) 03/27/2015 for a stage IIIC, high-grade right fallopian tube carcinoma             (a) baseline CA-125 was 1226.             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)             (c) tumor is PD-L1 negative (02/24/2016)             (d) tumor is strongly estrogen receptor positive, progesterone receptor negative (02/24/2016)  (1) adjuvant chemotherapy consisted of carboplatin  and paclitaxel for 6 doses, begun 04/14/2015, completed 08/11/2015             (a) paclitaxel was omitted from cycle 5 and dose reduced on cycle 6 because of neuropathy              (b) a "make up" dose of paclitaxel was given 08/25/2015             (c) last carboplatin dose was 08/11/2015             (d) CA-125 normalized by 06/16/2015              (2) FIRST RECURRENCE: January 2018             (a) CA-125 rise beginning November 2017 led to CT scans and PET scans December 2017, all negative             (b) exploratory laparotomy 02/24/2016 showed pathologically confirmed recurrence, with miliary disease involving all examined surfaces             (c) 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             (a) cycle 3 delayed one week because of neutropenia; OnPro added             (b) CA 125 normalized after cycle 3  (4) RUCAPARIB maintenance started 07/02/2016             (a) restaging 10/01/2016: Normal CA 125, negative CT of the abdomen and pelvis             (b) labs 11/24/2016 shows a rise in her CA 125-43.4, with continuing rise thereafter             (c) rucaparib discontinued October 2018 with rising CA 125  (5) anastrozole started December 23, 2016-stopped after a couple of weeks with poor  tolerance  SECOND RECURRENCE: (6) Gemcitabine/Bevacizumab started on 02/04/2017             (a) gemcitabine omitted cycle 4 because of intercurrent infection             (b) gemcitabine resumed with cycle 5, with intervening rise in  Ca 125             (c) repeat CT scan of the abdomen and pelvis on 05/26/2017 does not confirm obvious disease progression             (d) cycle 5 of gemcitabine/bevacizumab delayed 1 week              (e) gemcitabine/ bevacizumab discontinued after 6 cycles, with rising CA 125 (last dose 06/24/2017)  (7) foundation one testing did not show a high mutation burden and the microsatellite status was stable.  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  These were not immediately actionable  (8) Abraxane started 07/12/2017, given weekly or weeks 1 and 2 of each 3 weeks cycle, last dose 11/15/2017 (5 cycles)             (a) cycle 2 started 08/09/2017, will be day 1 day 8 only             (b) cycle 3 scheduled to start 09/13/2017,  day 1 day 8 at patient request             (c) CT of the abdomen and pelvis 11/28/2017 shows no measurable disease             (d) PET scan 01/25/2018 and MRI of the pelvis 02/03/2018 show no measurable disease             (e) patient has symptomatic ascites and a rapidly rising Ca1 25  (9) started cisplatin/gemcitabine days 1 and 8 of each 21-day cycle on 02/18/2018             (a) switched to carboplatin/ gemcitabine 07/17/2018 due to neuropathy             (b) carbo/gemzar changed to Q14 days 07/24/2018 due to neutropenia             (c) considered niraparib/Zejula 100 mg, 2 tabs daily at the completion of chemotherapy  (10) associated problems             (a) anemia due to chemotherapy: On Aranesp             (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 10/25/18 Consenting for AMV-564  clinical trial 21. 11/13/18 AMV-564 initiation 75 mcg dose 22. 10/8 Dropped down to 50 mcg dose (I think that date is correct) 23. 10/19-21/20 Tx held due to Neutropenia 24. 12/06/18 Given Neulasta 25. 12/07/18 AMV-564 resumed 26. 01/03/19 CT C/A/P 10% RECIST progression 27. 01/15/19 Increased dose to 75 mcg/daily 28. 01/17/19 Thoracentesis with cytology c/w [place] primary 29. Paracentesis 02/05/2019: cytology c/w adenocarcinoma  (12) schedule of thoracenteses:             (a) left 01/17/2019 [facility])             (b) left 02/06/2019 [facility])             (c) 05/21/2019 on the left [facility])  (13) schedule of paracenteses:             (a) 01/30/2019 ([place])             (b) 02/06/2019 ([place])             (c) 02/18/2018 ([place])             (d) 04/17/2019 ([place])             (e) additional procedures 04/30/2019, 05/24/2019  (14) abraxane started 02/20/2019, repeated days 1 and 8 of every 21 day cycle             (a) bevacizumab/Avastin started 02/20/2019 and repeated every 21 days             (b) She cannot tolerate Zarxio due to side effects consisting of low grade fever, body aches, malaise, fatigue, and itching, whereas she has been able to tolerate Granix though             (c) bevacizumab and Abraxane discontinued after 04/10/2019 dose with evidence of progression  (15) exemestane started 04/24/2019, discontinued after 2 weeks as patient opted for chemotherapy  (16) started liposomal doxorubicin/carboplatin 05/10/2019, repeated every 28 days             (a) echocardiogram 05/08/2017 shows an ejection fraction in the 55-60% range.             (b) patient is interested in [id number] if available  through the "right to try" program             (c) HER-2 determination obtained on prior pathology 06/04/2019--negative results             (d) considering [facility] , [facility] or [place] study  (e) doxil/carbo discontinued after 06/17/2021dose with evidence of progression    Plan:  Kathryn Ramsey is eager to get  home, and hopes for d/c tomorrow AM if possible.She will need to have home IVF in place (can run NS at 125 cc/r for 12 hrs 8P - 8A); also wishes to have home PTx; both these should be obtainable throuogh home health. She does have a central line (port) in place,   I have written for concentrated MSO4 solution and disintegrating zofran and well as phenergan suppositories for pain and nausea. Any of her other medications may be continued or not at your discretion.  I will continue to follow closely as outpatient.  Greatly appreciate your help to this patient and her family!  GM   Chauncey Cruel, MD 09/03/2019  6:41 PM Medical Oncology and Hematology [facility] [address] [place], [place] [address] Tel. [phone]    Fax. [phone]

## 2019-09-03 NOTE — Consult Note (Addendum)
Kathryn Ramsey [birth year]  [id number].    Requesting MD: Dr. 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  She underwent an ex lap at that time by Dr. Denman George for a debulking of her tumor burden.  She has since been followed by Dr. Jana Hakim and received chemotherapy.  She has also been to several other institutions to try some other agents as well.  She has recently been undergoing chemotherapy with doxorubicin/carboplatin.  Her last dose was on 08/02/19, but this was continued secondary to progression.  She has lost 40 pounds in the last couple of months and is currently only around 80 pounds.  In the last several weeks she has had worsening N/V after eating.  She will generally try to drink or eat something and within a couple of hours it comes back up.  She started having some abdominal pain with this and presented to the hospital last Thursday for evaluation.  She was found to have a duodenal obstruction with ascites on a CT scan.  She has not had a BM in about 1 week.  She has been admitted.  GI has evaluated her for possible stent placement but the length of narrowing is too long for stent placement.  IR has seen the patient for g-tube placement but is unable secondary to anatomy as there is no safe percutaneous window.  The idea was for a g-tube to be place and subsequently converted to a GJ tube for venting purposes and for distal feeding needs.  We have been asked to see the patient for surgical opinion and options.  ROS: ROS: Please see HPI, otherwise all other systems have been reviewed and are currently negative.  Family History  Problem Relation Age of Onset  . Hypertension Father   . Skin cancer Father        nonmelanoma skin cancers in his late 35s  .  Non-Hodgkin's lymphoma Maternal Aunt        dx. 36s; smoker  . Stroke Maternal Grandmother   . Other Mother        benign meningioma dx. early-mid-70s; hysterectomy in her late 29s for heavy periods - still has ovaries  . Other Son        one son with pre-cancerous skin findings  . Other Daughter        cysts on ovaries and hx of heavy periods  . Other Sister        hysterectomy for cysts - still has ovaries  . Heart attack Maternal Grandfather   . Heart attack Paternal Grandfather   . Renal cancer Maternal Aunt 60       smoker  . Breast cancer Maternal Aunt 78  . Other Maternal Aunt        dx. benign brain tumor (meningioma) at 62; 2nd benign brain tumor in her late 86s; hx of radical hysterectomy at age 39  . Brain cancer Other        NOS tumor    Past Medical History:  Diagnosis Date  . Acute sensory neuropathy 06/26/2015  . Allergy   . Anemia   . Asthma    illness induced asthma  . Blood transfusion without reported diagnosis    at age 62 years old D/T surgery to femur being crushed  .  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 Heart murmur    pt, states had a "working heart murmur"  . History of kidney stones   . Neutropenia, drug-induced ([place]) 03/10/2018  . PONV (postoperative nausea and vomiting)   . Skin cancer     Past Surgical History:  Procedure Laterality Date  . 3 laporscopic proceedures    . ABDOMINAL HYSTERECTOMY     2010  . CHOLECYSTECTOMY     2010  . DILATION AND CURETTAGE OF UTERUS     1997  . IR IMAGING GUIDED PORT INSERTION  03/08/2018  . LAPAROSCOPY N/A 03/27/2015   Procedure: DIAGNOSTIC LAPAROSCOPY ;  Surgeon: [name], MD;  Location: [facility];  Service: Gynecology;  Laterality: N/A;  . LAPAROSCOPY N/A 02/24/2016   Procedure: LAPAROSCOPY DIAGNOSTIC WITH PERITONEAL WASHINGS AND PERITONEAL BIOPSY;  Surgeon: [name], MD;  Location: [facility];  Service: Gynecology;  Laterality:  N/A;  . LAPAROTOMY N/A 03/27/2015   Procedure: EXPLORATORY LAPAROTOMY, BILATERAL SALPINGO OOPHORECTOMY, OMENTECTOMY, RADICAL TUMOR DEBULKING;  Surgeon: [name], MD;  Location: [facility];  Service: Gynecology;  Laterality: N/A;  . sinus surgery 1994      Social History:  reports that she has never smoked. She has never used smokeless tobacco. She reports current alcohol use. She reports that she does not use drugs.  Allergies:  Allergies  Allergen Reactions  . Bee Venom Shortness Of Breath    swelling  . Prochlorperazine Edisylate Anaphylaxis, Anxiety and Palpitations    Anaphylaxis, Anxiety and Palpitations   . Sulfa Antibiotics Shortness Of Breath    Vomit , diarrhea , hives  . Phenergan [Promethazine Hcl] Other (See Comments)    "funny sensation in my throat"  . Zarxio [Filgrastim]     Medications Prior to Admission  Medication Sig Dispense Refill  . acetaminophen (TYLENOL) 500 MG tablet Take 2 tablets by mouth 3 (three) times daily as needed for moderate pain.     [name] albuterol (VENTOLIN HFA) 108 (90 Base) MCG/ACT inhaler Inhale 2 puffs into the lungs every 6 (six) hours as needed for wheezing or shortness of breath.     . dicyclomine (BENTYL) 10 MG capsule TAKE 1 CAPSULE 4 TIMES DAILY BEFORE MEALS AND AT BEDTIME. (Patient taking differently: Take 10 mg by mouth in the morning, at noon, in the evening, and at bedtime. ) 120 capsule 0  . gabapentin (NEURONTIN) 100 MG capsule 200 mg bid with 300 mg qhs (Patient taking differently: Take 200 mg by mouth at bedtime. ) 630 capsule 1  . lactulose (CHRONULAC) 10 GM/15ML solution Take 10 g by mouth daily as needed for moderate constipation.     . lidocaine (LIDODERM) 5 % Place 1 patch onto the skin daily. Remove & Discard patch within 12 hours or as directed by MD    . LORazepam (ATIVAN) 0.5 MG tablet TAKE 1 TABLET AT BEDTIME AS NEEDED FOR NAUSEA AND VOMITING. (Patient taking differently: Take 0.5 mg by mouth at bedtime as needed  (nausea/vomiting). ) 20 tablet 0  . metoCLOPramide (REGLAN) 5 MG tablet Take 2 tablets (10 mg total) by mouth every 8 (eight) hours as needed for nausea. 30 tablet 2  . omeprazole (PRILOSEC) 40 MG capsule Take 1 capsule (40 mg total) by mouth daily. 30 capsule 5  . sertraline (ZOLOFT) 50 MG tablet TAKE 1 & 1/2 TABLETS ONCE DAILY. (Patient taking differently: Take 75 mg  by mouth daily. ) 45 tablet 0  . traMADol (ULTRAM) 50 MG tablet TAKE ONE TABLET EVERY 6 HOURS AS NEEDED. (Patient taking differently: Take 50 mg by mouth every 6 (six) hours as needed for moderate pain or severe pain. ) 60 tablet 0  . vitamin B-12 (CYANOCOBALAMIN) 1000 MCG tablet Take 1 tablet by mouth daily.    . diphenhydrAMINE (BENADRYL) 50 MG capsule 50 mg Diphenhydramine (Benadryl) PO within 1 hour of the injection 1 capsule 0  . ibuprofen (ADVIL) 600 MG tablet Take 1 tablet by mouth 4 (four) times daily as needed. (Patient not taking: Reported on 08/30/2019)    . lidocaine-prilocaine (EMLA) cream APPLY TOPICALLY AS NEEDED. (Patient not taking: Reported on 08/30/2019) 30 g 0  . loratadine (CLARITIN) 10 MG tablet Take 10 mg by mouth as needed.  (Patient not taking: Reported on 08/30/2019)    . Magnesium (V-R MAGNESIUM) 250 MG TABS Take by mouth. (Patient not taking: Reported on 08/30/2019)    . magnesium oxide (MAG-OX) 400 (241.3 Mg) MG tablet Take 1 tablet (400 mg total) by mouth daily. (Patient not taking: Reported on 08/30/2019) 60 tablet 3  . Multiple Vitamins-Minerals (MULTIVITAMIN ADULT PO) Take 1 tablet by mouth daily. (Patient not taking: Reported on 08/30/2019)    . Omega 3 1000 MG CAPS Take 1 capsule by mouth daily. (Patient not taking: Reported on 08/30/2019)    . prochlorperazine (COMPAZINE) 5 MG tablet Take 1 tablet (5 mg total) by mouth every 8 (eight) hours as needed for nausea or vomiting. (Patient not taking: Reported on 08/30/2019) 30 tablet 6  . promethazine (PHENERGAN) 25 MG suppository Place 1 suppository (25 mg total)  rectally every 6 (six) hours as needed for nausea or vomiting. (Patient not taking: Reported on 08/30/2019) 12 each 0  . scopolamine (TRANSDERM-SCOP) 1 MG/3DAYS APPLY 1 PATCH EVERY 3 DAYS AS NEEDED FOR NAUSEA. (Patient not taking: Reported on 08/30/2019) 4 patch 0  . scopolamine (TRANSDERM-SCOP) 1 MG/3DAYS Place 1 patch (1.5 mg total) onto the skin every 3 (three) days. (Patient not taking: Reported on 08/30/2019) 10 patch 12  . senna-docusate (SENOKOT-S) 8.6-50 MG tablet Take 1-2 tablets by mouth 2 (two) times daily as needed. (Patient not taking: Reported on 08/30/2019)    . valACYclovir (VALTREX) 1000 MG tablet Take 1 tablet (1,000 mg total) by mouth daily. (Patient not taking: Reported on 08/30/2019) 5 tablet 0  . VENTOLIN HFA 108 (90 Base) MCG/ACT inhaler  (Patient not taking: Reported on 08/30/2019)       Physical Exam: Blood pressure 92/64, pulse 83, temperature 99.4 F (37.4 C), temperature source Oral, resp. rate 14, height 5' 2.5" (1.588 m), weight 37.1 kg, SpO2 100 %. General: pleasant, cachetic, frail white female who is laying in bed in NAD HEENT: head is normocephalic, atraumatic.  Sclera are noninjected.  PERRL.  Ears and nose without any masses or lesions.  Mouth is pink and moist Heart: regular, rate, and rhythm.  Normal s1,s2. No obvious murmurs, gallops, or rubs noted.  Palpable radial and pedal pulses bilaterally Lungs: CTAB, no wheezes, rhonchi, or rales noted.  Respiratory effort nonlabored Abd: soft, NT, mild distention, hypoactive BS, no masses, hernias, or organomegaly palpable MS: all 4 extremities are symmetrical with no cyanosis, clubbing, or edema. Extreme atrophy Skin: warm and dry with no masses, lesions, or rashes Neuro: Cranial nerves 2-12 grossly intact, sensation is normal throughout Psych: A&Ox3 with an appropriate affect.   Results for orders placed or performed during the hospital encounter  of 08/30/19 (from the past 48 hour(s))  Glucose, capillary     Status:  Abnormal   Collection Time: 09/01/19  3:48 PM  Result Value Ref Range   Glucose-Capillary 102 (H) 70 - 99 mg/dL    Comment: Glucose reference range applies only to samples taken after fasting for at least 8 hours.  Glucose, capillary     Status: Abnormal   Collection Time: 09/01/19 11:57 PM  Result Value Ref Range   Glucose-Capillary 114 (H) 70 - 99 mg/dL    Comment: Glucose reference range applies only to samples taken after fasting for at least 8 hours.  Procalcitonin     Status: None   Collection Time: 09/02/19  4:06 AM  Result Value Ref Range   Procalcitonin <0.10 ng/mL    Comment:        Interpretation: PCT (Procalcitonin) <= 0.5 ng/mL: Systemic infection (sepsis) is not likely. Local bacterial infection is possible. (NOTE)       Sepsis PCT Algorithm           Lower Respiratory Tract                                      Infection PCT Algorithm    ----------------------------     ----------------------------         PCT < 0.25 ng/mL                PCT < 0.10 ng/mL          Strongly encourage             Strongly discourage   discontinuation of antibiotics    initiation of antibiotics    ----------------------------     -----------------------------       PCT 0.25 - 0.50 ng/mL            PCT 0.10 - 0.25 ng/mL               OR       >80% decrease in PCT            Discourage initiation of                                            antibiotics      Encourage discontinuation           of antibiotics    ----------------------------     -----------------------------         PCT >= 0.50 ng/mL              PCT 0.26 - 0.50 ng/mL               AND        <80% decrease in PCT             Encourage initiation of                                             antibiotics       Encourage continuation           of antibiotics    ----------------------------     -----------------------------        PCT >=  0.50 ng/mL                  PCT > 0.50 ng/mL               AND         increase in  PCT                  Strongly encourage                                      initiation of antibiotics    Strongly encourage escalation           of antibiotics                                     -----------------------------                                           PCT <= 0.25 ng/mL                                                 OR                                        > 80% decrease in PCT                                      Discontinue / Do not initiate                                             antibiotics  Performed at [facility] [address], [place], [place] [address]   Glucose, capillary     Status: Abnormal   Collection Time: 09/02/19  7:39 AM  Result Value Ref Range   Glucose-Capillary 106 (H) 70 - 99 mg/dL    Comment: Glucose reference range applies only to samples taken after fasting for at least 8 hours.  Glucose, capillary     Status: Abnormal   Collection Time: 09/02/19  3:39 PM  Result Value Ref Range   Glucose-Capillary 112 (H) 70 - 99 mg/dL    Comment: Glucose reference range applies only to samples taken after fasting for at least 8 hours.  Glucose, capillary     Status: Abnormal   Collection Time: 09/02/19 11:20 PM  Result Value Ref Range   Glucose-Capillary 100 (H) 70 - 99 mg/dL    Comment: Glucose reference range applies only to samples taken after fasting for at least 8 hours.  CBC     Status: Abnormal   Collection Time: 09/03/19  5:05 AM  Result Value Ref Range   WBC 3.8 (L) 4.0 - 10.5 K/uL   RBC 2.20 (L) 3.87 - 5.11 MIL/uL   Hemoglobin 7.3 (L) 12.0 -  15.0 g/dL   HCT 22.1 (L) 36 - 46 %   MCV 100.5 (H) 80.0 - 100.0 fL   MCH 33.2 26.0 - 34.0 pg   MCHC 33.0 30.0 - 36.0 g/dL   RDW 23.2 (H) 11.5 - 15.5 %   Platelets 165 150 - 400 K/uL   nRBC 0.0 0.0 - 0.2 %    Comment: Performed at [facility], [place] [address] [place], [place] [address]  Comprehensive metabolic panel     Status: Abnormal   Collection  Time: 09/03/19  5:05 AM  Result Value Ref Range   Sodium 129 (L) 135 - 145 mmol/L   Potassium 3.8 3.5 - 5.1 mmol/L   Chloride 96 (L) 98 - 111 mmol/L   CO2 25 22 - 32 mmol/L   Glucose, Bld 103 (H) 70 - 99 mg/dL    Comment: Glucose reference range applies only to samples taken after fasting for at least 8 hours.   BUN 17 6 - 20 mg/dL   Creatinine, Ser 0.78 0.44 - 1.00 mg/dL   Calcium 8.1 (L) 8.9 - 10.3 mg/dL   Total Protein 6.3 (L) 6.5 - 8.1 g/dL   Albumin 3.0 (L) 3.5 - 5.0 g/dL   AST 12 (L) 15 - 41 U/L   ALT 10 0 - 44 U/L   Alkaline Phosphatase 87 38 - 126 U/L   Total Bilirubin 0.4 0.3 - 1.2 mg/dL   GFR calc non Af Amer >60 >60 mL/min   GFR calc Af Amer >60 >60 mL/min   Anion gap 8 5 - 15    Comment: Performed at [facility] [address] [place], [place] [address]  Glucose, capillary     Status: None   Collection Time: 09/03/19  7:52 AM  Result Value Ref Range   Glucose-Capillary 86 70 - 99 mg/dL    Comment: Glucose reference range applies only to samples taken after fasting for at least 8 hours.   *Note: Due to a large number of results and/or encounters for the requested time period, some results have not been displayed. A complete set of results can be found in Results Review.   No results found.    Assessment/Plan Anemia of chronic disease SPCM Metastatic ovarian cancer with carcinomatosis  Duodenal obstruction  This is a very kind female who has her husband present with her today.  She has metastatic ovarian cancer with carcinomatosis who was admitted secondary to duodenal obstruction secondary to her malignancy.  She has been evaluated for stent placement by GI which is not amendable.  Her anatomy is not such for a perc g-tube.    We have been asked to surgically see her for a G and possibly a J tube.  The g-tube would be for venting and the J-tube for distal feeding.  We discussed many things during our long conversation.  I ultimately wanted to  determine what the patient's overall goals of care were to help in explaining this situation to her.  She states her goal "is to get out of the hospital, home, and to spend time with her kids."  This came about as I was trying to elicit more information about the need for a feeding J-tube.  We discussed if further treatment was an option and if that was something she was entertaining.  Initially in the conversation, she was somewhat open to this if it was a possibility.  We then turned out attention to the actual procedures themselves.  We discussed possible laparoscopic tube placement vs open tube placement.  We discussed the differences in these and what that meant for her long ter.  A laparoscopic tube placement would put her at less risk for wound healing issues and could get her back to chemotherapy sooner than an open operation, if this were an option.  We discussed open surgery carries a high number and risk of complications such as wound healing, ascites leakage, etc.  We are going to check a prealbumin as she clearly has sever protein calorie malnutrition, which we also discussed puts her at risk for surgical complications.  We discussed either procedure carries a risk of leakage of ascites around her tube as well as risk for infection secondary to this leakage.  We ultimately discussed that surgery is not a guarantee.  Because of her carcinomatosis and prior debulking surgery with likely adhesive disease, it is a possibility that we are unable to place a tube (either g or J) secondary to not being able to technically do it because everything is stuck from tumor and scar tissue.  We discussed that if we could place a g-tube, that there is no guarantee things would be free enough to place a j-tube without risk for complications such as fistulae etc from freeing up the bowel.  We discussed the complications one can have from the J tube itself such as limitations in medications that can be put down this vs  clogging vs causing an obstruction itself.  Lastly, we discussed that we may not even be able to enter the abdomen as everything is completely frozen and nothing would be able to be placed.     After all of this, the patient stated that the last thing she wanted was to have a surgery that would cause her to have to remain in the hospital due to complications and not be able to return home and to be with her kids.  I told her surgery, in her case, could not guarantee this may not be a possibility. I asked if she had ever spoken with palliative care to really help delineate her goals of care and how she sees the rest of her life playing out.  She stated she had not, but would really like to speak with them to help her and her husband make this decision.  The more we spoke, the more the patient learned about this surgical option, and the more apprehensive she, appropriately, became based off of her current desires out of life.  She is a very kind, pleasant, and reasonable woman.  Her husband was present for the entirety of the conversation as well.    All questions they had were answered to the best of my ability.  I did let them know that I can only given possibilities, that we would not definitively know what we could or could not do until we actually got in her abdomen.  They both understand all of the above very well and are very appreciative of the care they have received thus far.  I have written an order for a palliative care consult and discussed all of this with Dr. Avon Gully as well.  Dr. Lucia Gaskins will see the patient later today as well.     FEN - FLD  VTE - ok from our standpoint, hgb 7.3 (may need a unit prior to surgery if we move forward) ID - none currently   Henreitta Cea, [facility] 09/03/2019, 2:30 PM Please see  Amion for pager number during day hours 7:00am-4:30pm or 7:00am -11:30am on weekends  Agree with above. Very difficult situation. They are appreciative of  all the input.  All surgical options carry significant risks with very limited benefits. She has been seen by Dr. Jana Hakim and he is going to try to get her out of the hospital tomorrow AM.  Alphonsa Overall, MD, [facility] Office phone:  [phone]

## 2019-09-03 NOTE — Progress Notes (Signed)
Kathryn Ramsey's situation is difficult. She knows she has an incurable and eventually terminal disease. However she is not actively dying and in fact remains very functional despite her situation. She now has a bowel obstruction we cannot stent or bypass. Choices are  Constant vomiting  Ng suction and permanent hospitalization/ institutionalization or  G-tube placement. I think this latter is the only viable option, understanding concerns re leakage and infection, which I have discussed extensively with her  If a G-tube is to be placed, I would favor also placement of a jejunostomy so she can obtain nourishment. This is also her clear preference, if at all possible  I will be glad to discuss this further if questions remain-- my cell is [phone]  Thank you for your help to this patient!  GM

## 2019-09-03 NOTE — Progress Notes (Signed)
Patient suffers from general weakness,decreased activity tolerance, N/V, abd pain 2* metastatic cancer which impairs her ability to perform daily activities like ambulation, climbing stairs in the home.  A walker alone will not resolve the issues with performing activities of daily living. A wheelchair will allow patient to safely perform daily activities.  The patient can self propel in the home or has a caregiver who can provide assistance.        [facility]  Office: [phone] Pager: [phone]

## 2019-09-03 NOTE — Evaluation (Signed)
Physical Therapy Evaluation Patient Details Name: Kathryn Ramsey MRN: [id number] DOB: August 24, 1965 Today's Date: 09/03/2019   History of Present Illness  54 yo female admitted with N/V, abd pain. Hx of met fallopian tube cancer, anemia, neuropathy  Clinical Impression  On eval, pt was Min guard assist for mobility. She walked ~100 feet around the unit. Mildly unsteady at times with intermittent drifting noted. Pt reported LE fatigue + weak feeling "legs like jello" after ~50 feet. Pt presents with general weakness, decreased activity tolerance. Recommend RW for ambulation safety. Pt would also like to have a wheelchair, if possible, for longer distances since she tends to get weak and fatigued fairly easily. Pt is agreeable to HHPT f/u after discharge. Will continue to follow during hospital stay.     Follow Up Recommendations Home health PT;Supervision/Assistance - 24 hour    Equipment Recommendations  Rolling walker with 5" wheels;3in1;Wheelchair    Recommendations for Other Services       Precautions / Restrictions Precautions Precautions: Fall Restrictions Weight Bearing Restrictions: No      Mobility  Bed Mobility Overal bed mobility: Modified Independent                Transfers Overall transfer level: Modified independent                  Ambulation/Gait Ambulation/Gait assistance: Min guard Gait Distance (Feet): 100 Feet Assistive device: None Gait Pattern/deviations: Step-through pattern;Decreased stride length     General Gait Details: slow gait speed. pt began to fatigue after ~50 feet. pt denies dizziness.  Stairs Stairs: Yes Stairs assistance: Min guard Stair Management: Forwards;Step to pattern Number of Stairs: 2 General stair comments: close guard for safety. cues for safety, technique.  Wheelchair Mobility    Modified Rankin (Stroke Patients Only)       Balance Overall balance assessment: Mild deficits observed, not formally tested                                            Pertinent Vitals/Pain Pain Assessment: 0-10 Pain Score: 7  Pain Location: abd, back Pain Descriptors / Indicators: Guarding;Discomfort;Sore Pain Intervention(s): Monitored during session;Limited activity within patient's tolerance    Home Living Family/patient expects to be discharged to:: Private residence Living Arrangements: Children;Spouse/significant other Available Help at Discharge: Family Type of Home: House Home Access: Stairs to enter Entrance Stairs-Rails: Right Entrance Stairs-Number of Steps: 2 Home Layout: Able to live on main level with bedroom/bathroom Home Equipment: None      Prior Function Level of Independence: Independent               Hand Dominance        Extremity/Trunk Assessment   Upper Extremity Assessment Upper Extremity Assessment: Generalized weakness    Lower Extremity Assessment Lower Extremity Assessment: Generalized weakness    Cervical / Trunk Assessment Cervical / Trunk Assessment: Kyphotic  Communication   Communication: No difficulties  Cognition Arousal/Alertness: Awake/alert Behavior During Therapy: WFL for tasks assessed/performed Overall Cognitive Status: Within Functional Limits for tasks assessed                                        General Comments      Exercises     Assessment/Plan    PT Assessment Patient  needs continued PT services  PT Problem List Decreased strength;Decreased mobility;Decreased activity tolerance;Decreased balance;Pain       PT Treatment Interventions DME instruction;Gait training;Therapeutic activities;Therapeutic exercise;Patient/family education;Balance training;Functional mobility training    PT Goals (Current goals can be found in the Care Plan section)  Acute Rehab PT Goals Patient Stated Goal: home to be with family PT Goal Formulation: With patient Time For Goal Achievement: 09/17/19 Potential  to Achieve Goals: Good    Frequency Min 3X/week   Barriers to discharge        Co-evaluation               AM-PAC PT "6 Clicks" Mobility  Outcome Measure Help needed turning from your back to your side while in a flat bed without using bedrails?: None Help needed moving from lying on your back to sitting on the side of a flat bed without using bedrails?: None Help needed moving to and from a bed to a chair (including a wheelchair)?: None Help needed standing up from a chair using your arms (e.g., wheelchair or bedside chair)?: None Help needed to walk in hospital room?: A Little Help needed climbing 3-5 steps with a railing? : A Little 6 Click Score: 22    End of Session Equipment Utilized During Treatment: Gait belt Activity Tolerance: Patient limited by fatigue Patient left: in bed;with call bell/phone within reach;with family/visitor present   PT Visit Diagnosis: Muscle weakness (generalized) (M62.81);Difficulty in walking, not elsewhere classified (R26.2);Unsteadiness on feet (R26.81);Pain Pain - part of body:  (back, abd)    Time: 6203-5597 PT Time Calculation (min) (ACUTE ONLY): 23 min   Charges:   PT Evaluation $PT Eval Low Complexity: [place], PT Acute Rehabilitation  Office: [phone] Pager: [phone]

## 2019-09-04 ENCOUNTER — Encounter: Payer: Self-pay | Admitting: Oncology

## 2019-09-04 DIAGNOSIS — D6481 Anemia due to antineoplastic chemotherapy: Secondary | ICD-10-CM | POA: Diagnosis not present

## 2019-09-04 DIAGNOSIS — C5701 Malignant neoplasm of right fallopian tube: Secondary | ICD-10-CM | POA: Diagnosis not present

## 2019-09-04 LAB — PREPARE RBC (CROSSMATCH)

## 2019-09-04 LAB — GLUCOSE, CAPILLARY
Glucose-Capillary: 108 mg/dL — ABNORMAL HIGH (ref 70–99)
Glucose-Capillary: 93 mg/dL (ref 70–99)
Glucose-Capillary: 94 mg/dL (ref 70–99)
Glucose-Capillary: 96 mg/dL (ref 70–99)

## 2019-09-04 LAB — PREALBUMIN: Prealbumin: 12.4 mg/dL — ABNORMAL LOW (ref 18–38)

## 2019-09-04 MED ORDER — LORATADINE 10 MG PO TABS
10.0000 mg | ORAL_TABLET | Freq: Every day | ORAL | Status: DC
  Administered 2019-09-04: 10 mg via ORAL
  Filled 2019-09-04: qty 1

## 2019-09-04 MED ORDER — SODIUM CHLORIDE 0.9% IV SOLUTION: Freq: Once | INTRAVENOUS | Status: DC

## 2019-09-04 MED ORDER — MORPHINE SULFATE (CONCENTRATE) 10 MG/0.5ML PO SOLN: 10.0000 mg | ORAL | 0 refills | Status: DC | PRN

## 2019-09-04 MED ORDER — HEPARIN SOD (PORK) LOCK FLUSH 100 UNIT/ML IV SOLN
500.0000 [IU] | INTRAVENOUS | Status: AC | PRN
  Administered 2019-09-04: 500 [IU]
  Filled 2019-09-04: qty 5

## 2019-09-04 MED ORDER — ACETAMINOPHEN 325 MG PO TABS
325.0000 mg | ORAL_TABLET | Freq: Once | ORAL | Status: DC
  Filled 2019-09-04: qty 1

## 2019-09-04 MED ORDER — DIPHENHYDRAMINE HCL 50 MG/ML IJ SOLN: 25.0000 mg | Freq: Once | INTRAMUSCULAR | Status: DC

## 2019-09-04 MED ORDER — MELATONIN 3 MG PO TABS: 3.0000 mg | ORAL_TABLET | Freq: Every day | ORAL | 0 refills | Status: AC

## 2019-09-04 MED ORDER — ENSURE ENLIVE PO LIQD: 237.0000 mL | Freq: Three times a day (TID) | ORAL | 12 refills | Status: AC

## 2019-09-04 NOTE — Care Management (Signed)
    Durable Medical Equipment  (From admission, onward)         Start     Ordered   09/04/19 1358  DME 3-in-1  Once        09/04/19 1358   09/04/19 1357  DME standard manual wheelchair with seat cushion  Once       Comments: Patient suffers from ambulatory dysfunction/weakness which impairs their ability to perform daily activities like bathing, dressing, feeding, grooming, and toileting in the home.  A walker will not resolve issue with performing activities of daily living. A wheelchair will allow patient to safely perform daily activities. Patient can safely propel the wheelchair in the home or has a caregiver who can provide assistance. Length of need Lifetime. Accessories: elevating leg rests (ELRs), wheel locks, extensions and anti-tippers.   09/04/19 1358   09/04/19 1006  For home use only DME 3 n 1  Once        09/04/19 1005

## 2019-09-04 NOTE — Progress Notes (Signed)
Social visit today.  Confirmed plans for home today with no surgery.  Informed her although risks etc don't change, we are available if needed.    Kathryn Ramsey  10:30 AM 09/04/2019

## 2019-09-04 NOTE — TOC Initial Note (Signed)
Transition of Care [facility]) - Initial/Assessment Note    Patient Details  Name: Kathryn Ramsey MRN: [id number] Date of Birth: February 16, 1966  Transition of Care [facility] CM/SW Contact:    Dessa Phi, RN Phone Number: 09/04/2019, 12:02 PM  Clinical Narrative: [facility] accepted for HHPT;[facility] for 3n1,w/c once orders placed-they will deliver to rm prior d/c;otpt palliative care-[facility] rep [facility] aware. No further CM needs.                  Expected Discharge Plan: [place] Barriers to Discharge: No Barriers Identified   Patient Goals and CMS Choice Patient states their goals for this hospitalization and ongoing recovery are:: go home CMS Medicare.gov Compare Post Acute Care list provided to:: Patient Choice offered to / list presented to : Patient  Expected Discharge Plan and Services Expected Discharge Plan: [place]   Discharge Planning Services: CM Consult Post Acute Care Choice: Durable Medical Equipment, Home Health Living arrangements for the past 2 months: Single Family Home                 DME Arranged: 3-N-1, Wheelchair manual DME Agency: [facility] Date DME Agency Contacted: 09/04/19 Time DME Agency Contacted: 86 Representative spoke with at DME Agency: Thedore Mins HH Arranged: PT [place] Agency: [facility] Date [place]: 09/04/19 Time [place]: 1202 Representative spoke with at [place]: [place] Arrangements/Services Living arrangements for the past 2 months: [place] with:: Spouse Patient language and need for interpreter reviewed:: Yes Do you feel safe going back to the place where you live?: Yes      Need for Family Participation in Patient Care: No (Comment) Care giver support system in place?: Yes (comment)   Criminal Activity/Legal Involvement Pertinent to Current Situation/Hospitalization: No - Comment as needed  Activities of Daily Living Home Assistive  Devices/Equipment: Eyeglasses ADL Screening (condition at time of admission) Patient's cognitive ability adequate to safely complete daily activities?: Yes Is the patient deaf or have difficulty hearing?: No Does the patient have difficulty seeing, even when wearing glasses/contacts?: No Does the patient have difficulty concentrating, remembering, or making decisions?: No Patient able to express need for assistance with ADLs?: Yes Does the patient have difficulty dressing or bathing?: No Independently performs ADLs?: Yes (appropriate for developmental age) Does the patient have difficulty walking or climbing stairs?: No Weakness of Legs: None Weakness of Arms/Hands: None  Permission Sought/Granted Permission sought to share information with : Case Manager Permission granted to share information with : Yes, Verbal Permission Granted  Share Information with NAME: Case Manager           Emotional Assessment Appearance:: Appears stated age Attitude/Demeanor/Rapport: Gracious Affect (typically observed): Accepting Orientation: : Oriented to Self, Oriented to Place, Oriented to  Time Alcohol / Substance Use: Not Applicable Psych Involvement: No (comment)  Admission diagnosis:  Nausea & vomiting [R11.2] Non-intractable vomiting with nausea, unspecified vomiting type [R11.2] Left upper quadrant abdominal pain [R10.12] Patient Active Problem List   Diagnosis Date Noted  . Nausea & vomiting 08/30/2019  . Dysphagia 05/25/2019  . Neuropathy due to chemotherapeutic drug ([place]) 07/24/2018  . Anemia due to antineoplastic chemotherapy 05/22/2018  . Port-A-Cath in place 05/01/2018  . Neutropenia, drug-induced ([place]) 03/10/2018  . Diarrhea 08/02/2017  . Lichen sclerosus et atrophicus of the vulva 03/31/2016  . Abdominal wall mass of right lower quadrant 03/08/2016  . 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 Goals of care,  counseling/discussion 02/27/2016  . Elevated CA-125 02/24/2016  .  Vaginal dryness, menopausal 10/01/2015  . Drug-induced peripheral neuropathy ([place]) 09/02/2015  . Leukopenia due to antineoplastic chemotherapy ([place]) 09/02/2015  . Chemotherapy-induced neuropathy ([place]) 08/19/2015  . Cellulitis 07/03/2015  . Acute sensory neuropathy 06/26/2015  . High risk medication use 06/21/2015  . Facial paresthesia 06/18/2015  . Genetic testing 06/16/2015  . Restless legs 05/14/2015  . Family history of breast cancer in female 04/29/2015  . Chemotherapy-induced nausea 04/22/2015  . Chemotherapy induced neutropenia ([place]) 04/22/2015  . 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 History of hysterectomy for benign disease 03/21/2015  . IBS (irritable bowel syndrome) 03/21/2015  . Dense breast tissue 03/21/2015  . History of cholecystectomy 03/21/2015  . 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 [place] [address] Phone: [phone] Fax: [phone]     Social Determinants of Health (SDOH) Interventions    Readmission Risk Interventions No flowsheet data found.

## 2019-09-04 NOTE — Discharge Summary (Signed)
Physician Discharge Summary  Kathryn Ramsey WUJ:[id number] DOB: November 19, 1965 DOA: 08/30/2019  PCP: Patient, No Pcp Per  Admit date: 08/30/2019 Discharge date: 09/04/2019  Admitted From: Home Disposition: Home with palliative care  Recommendations for Outpatient Follow-up:  1. Follow up with palliative care as discussed  Home Health: Home health PT Equipment/Devices: Wheelchair, 3 and 1  Discharge Condition: Guarded CODE STATUS: Full Diet recommendation: As tolerated  Brief/Interim Summary: Kathryn Ramsey a 54 y.o.femalewithhistory of metastatic fallopian tube cancer with carcinomatosis was referred to the ER the patient was having persistent nausea vomiting. Patient has been any nausea vomiting which is acutely worsened last 2 days and last bowel movement almost a week ago. Patient is being followed by level gastroenterologist and is planned to have a stent placed in small bowel but due to worsening vomiting patient presents to the ER after discussing with oncologist. In the ER patient had a CT abdomen pelvis which shows features concerning for peritoneal carcinomatosis. ER physician has discussed with on-call oncologist and gastroenterologist. Gastroenterologist is recommending NG tube placement IV antibiotics fluids n.p.o. NGT was unable to be placed despite multiple attempts. Patient was started on Flagyl and Cipro. Labs are remarkable for acute renal failure with creatinine of 1.3 and hemoglobin is around 8.9 Covid test was negative. Potassium was low at 3.  Patient admitted as above with acute worsening in p.o. intake in the setting of likely obstructive disease from metastatic fallopian tube cancer.  Usually patient was evaluated by GI, hematology oncology with possible placement of stent to duodenum to alleviate stricture.  Next plan was to discuss with IR to have percutaneous J-tube or JG tube placed for feeding and venting of vomiting, unfortunately due to cellulitis and no window IR was  unable to assist with this endeavor.  Ultimately surgery was involved to further discuss open or laparoscopic feeding tube placement but given the elevated risk of this procedure and further delay in chemotherapy dosing postoperatively family and patient discussed with multiple team members there wishes to be discharged home under palliative care.  At this time patient feels markedly improved admission, able to tolerate much more p.o. now on her full liquid diet that she was previously, able to work with physical therapy and while somewhat weak is improved from previous.  At this time she is otherwise stable and agreeable for discharge home with family, palliative care will follow up in the outpatient setting.  Discharge Diagnoses:  Principal Problem:   Nausea & vomiting Active Problems:   Abdominal carcinomatosis (HCC)   Cancer of right fallopian tube (HCC)   Anemia due to antineoplastic chemotherapy    Discharge Instructions  Discharge Instructions    Call MD for:  difficulty breathing, headache or visual disturbances   Complete by: As directed    Call MD for:  extreme fatigue   Complete by: As directed    Call MD for:  hives   Complete by: As directed    Call MD for:  persistant dizziness or light-headedness   Complete by: As directed    Call MD for:  persistant nausea and vomiting   Complete by: As directed    Call MD for:  severe uncontrolled pain   Complete by: As directed    Diet - low sodium heart healthy   Complete by: As directed    Increase activity slowly   Complete by: As directed      Allergies as of 09/04/2019      Reactions   Bee Venom Shortness  Of Breath   swelling   Prochlorperazine Edisylate Anaphylaxis, Anxiety, Palpitations   Anaphylaxis, Anxiety and Palpitations    Sulfa Antibiotics Shortness Of Breath   Vomit , diarrhea , hives   Phenergan [promethazine Hcl] Other (See Comments)   "funny sensation in my throat"   Zarxio [filgrastim]       Medication  List    STOP taking these medications   ibuprofen 600 MG tablet Commonly known as: ADVIL   lidocaine-prilocaine cream Commonly known as: EMLA   loratadine 10 MG tablet Commonly known as: CLARITIN   magnesium oxide 400 (241.3 Mg) MG tablet Commonly known as: MAG-OX   MULTIVITAMIN ADULT PO   Omega 3 1000 MG Caps   prochlorperazine 5 MG tablet Commonly known as: COMPAZINE   promethazine 25 MG suppository Commonly known as: PHENERGAN   scopolamine 1 MG/3DAYS Commonly known as: TRANSDERM-SCOP   senna-docusate 8.6-50 MG tablet Commonly known as: Senokot-S   V-R MAGNESIUM 250 MG Tabs Generic drug: Magnesium   valACYclovir 1000 MG tablet Commonly known as: VALTREX     TAKE these medications   acetaminophen 500 MG tablet Commonly known as: TYLENOL Take 2 tablets by mouth 3 (three) times daily as needed for moderate pain.   albuterol 108 (90 Base) MCG/ACT inhaler Commonly known as: VENTOLIN HFA Inhale 2 puffs into the lungs every 6 (six) hours as needed for wheezing or shortness of breath. What changed: Another medication with the same name was removed. Continue taking this medication, and follow the directions you see here.   dicyclomine 10 MG capsule Commonly known as: BENTYL TAKE 1 CAPSULE 4 TIMES DAILY BEFORE MEALS AND AT BEDTIME. What changed: See the new instructions.   diphenhydrAMINE 50 MG capsule Commonly known as: BENADRYL 50 mg Diphenhydramine (Benadryl) PO within 1 hour of the injection   feeding supplement (ENSURE ENLIVE) Liqd Take 237 mLs by mouth 3 (three) times daily after meals.   gabapentin 100 MG capsule Commonly known as: Neurontin 200 mg bid with 300 mg qhs What changed:   how much to take  how to take this  when to take this  additional instructions   lactulose 10 GM/15ML solution Commonly known as: CHRONULAC Take 10 g by mouth daily as needed for moderate constipation.   lidocaine 5 % Commonly known as: LIDODERM Place 1 patch  onto the skin daily. Remove & Discard patch within 12 hours or as directed by MD   LORazepam 0.5 MG tablet Commonly known as: ATIVAN TAKE 1 TABLET AT BEDTIME AS NEEDED FOR NAUSEA AND VOMITING. What changed: See the new instructions.   melatonin 3 MG Tabs tablet Take 1 tablet (3 mg total) by mouth at bedtime.   metoCLOPramide 5 MG tablet Commonly known as: REGLAN Take 2 tablets (10 mg total) by mouth every 8 (eight) hours as needed for nausea.   morphine CONCENTRATE 10 MG/0.5ML Soln concentrated solution Take 0.5 mLs (10 mg total) by mouth every 2 (two) hours as needed for up to 3 days for moderate pain or shortness of breath.   omeprazole 40 MG capsule Commonly known as: PRILOSEC Take 1 capsule (40 mg total) by mouth daily.   sertraline 50 MG tablet Commonly known as: ZOLOFT TAKE 1 & 1/2 TABLETS ONCE DAILY. What changed: See the new instructions.   traMADol 50 MG tablet Commonly known as: ULTRAM TAKE ONE TABLET EVERY 6 HOURS AS NEEDED. What changed: See the new instructions.   vitamin B-12 1000 MCG tablet Commonly known as: CYANOCOBALAMIN Take  1 tablet by mouth daily.            Durable Medical Equipment  (From admission, onward)         Start     Ordered   09/04/19 1358  DME 3-in-1  Once        09/04/19 1358   09/04/19 1357  DME standard manual wheelchair with seat cushion  Once       Comments: Patient suffers from ambulatory dysfunction/weakness which impairs their ability to perform daily activities like bathing, dressing, feeding, grooming, and toileting in the home.  A walker will not resolve issue with performing activities of daily living. A wheelchair will allow patient to safely perform daily activities. Patient can safely propel the wheelchair in the home or has a caregiver who can provide assistance. Length of need Lifetime. Accessories: elevating leg rests (ELRs), wheel locks, extensions and anti-tippers.   09/04/19 1358   09/04/19 1006  For home use  only DME 3 n 1  Once        09/04/19 1005          Follow-up Information    [facility] Follow up.   Specialty: [place] Why: [facility] physical therapy Contact information: [place] [address] [phone]        [facility] Follow up.   Why: bedside commode/wheelchair Contact information: [place] [address] [phone]        [facility] Follow up.   Specialty: PALLIATIVE CARE Why: home nurse visit. Contact information: [place] [address] [phone]             Allergies  Allergen Reactions  . Bee Venom Shortness Of Breath    swelling  . Prochlorperazine Edisylate Anaphylaxis, Anxiety and Palpitations    Anaphylaxis, Anxiety and Palpitations   . Sulfa Antibiotics Shortness Of Breath    Vomit , diarrhea , hives  . Phenergan [Promethazine Hcl] Other (See Comments)    "funny sensation in my throat"  . Zarxio [Filgrastim]     Consultations:  GI, heme-onc, IR, general surgery, palliative care   Procedures/Studies: CT ABDOMEN PELVIS W CONTRAST  Result Date: 08/30/2019 CLINICAL DATA:  Abdominal pain, bilious vomiting, ovarian cancer EXAM: CT ABDOMEN AND PELVIS WITH CONTRAST TECHNIQUE: Multidetector CT imaging of the abdomen and pelvis was performed using the standard protocol following bolus administration of intravenous contrast. CONTRAST:  71mL OMNIPAQUE IOHEXOL 300 MG/ML  SOLN COMPARISON:  07/30/2019 FINDINGS: Lower chest: Moderate left pleural effusion is again identified with compressive atelectasis of the left lung base. Visualized right lung base is clear. Central venous catheter tip noted within the superior cavoatrial junction. Cardiac size within normal limits. Hepatobiliary: Cholecystectomy has been performed. Stable hemangioma within the a inferior right hepatic lobe. The liver is otherwise unremarkable. No intra or extrahepatic biliary  ductal dilation identified. Pancreas: Unremarkable Spleen: Unremarkable Adrenals/Urinary Tract: The adrenal glands are unremarkable. Mass effect upon the right kidney by the distended duodenum is again identified, however, the kidneys are otherwise unremarkable. There is normal symmetric renal cortical enhancement. The bladder appears circumferentially mildly thick walled and demonstrates moderate perivesicular inflammatory stranding, similar to prior examination, possibly reflecting a chronic inflammatory process such as a chronic infection or radiation cystitis in the appropriate clinical setting. The bladder is not distended. Stomach/Bowel: There is massive distension of the a first and second portion of the duodenum to the level of the a point at which it  crosses the midline. In this region, however, there is reasonable space between the abdominal aorta and superior mesenteric artery making SMA syndrome unlikely as a cause of obstruction. There is, however, numerous thick walled loops of small bowel again identified clustered within the mid abdomen. There is moderate ascites contain within thickened peritoneum, similar to that noted on prior examination. 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 No focal point of transition is identified, though the caliber of bowel does improve once the ileum exits the encapsulating peritoneum, best seen on axial image # 60/2. No free intraperitoneal gas. The large bowel is unremarkable. Vascular/Lymphatic: No pathologic adenopathy seen within the abdomen and pelvis. The inferior vena cava is compressed, progressive since prior examination, likely a combination of the encapsulating process as well as some degree of hypovolemia. The arterial vasculature is unremarkable. Reproductive: Uterus absent.  No adnexal masses. Other: There is diffuse body wall wasting noted, progressive  since prior examination. Musculoskeletal: The osseous structures are unremarkable. IMPRESSION: Diffuse bowel wall thickening and central clumping within encapsulating thickened peritoneum and moderate ascites. 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 This is most commonly seen in the setting of peritoneal dialysis or peritoneal carcinomatosis. Massive distention of the duodenum is progressive since prior examination. Stable chronic bladder wall thickening and perivesicular inflammatory stranding, possibly reflecting changes of a radiation cystitis in the appropriate clinical setting. Progressive body wall wasting. Electronically Signed   By: Fidela Salisbury MD   On: 08/30/2019 20:35   DG Abd 2 Views  Result Date: 08/27/2019 CLINICAL DATA:  Obstruction. EXAM: ABDOMEN - 2 VIEW COMPARISON:  None. FINDINGS: The bowel gas pattern is normal. Status post cholecystectomy. There is no evidence of free air. No radio-opaque calculi or other significant radiographic abnormality is seen. IMPRESSION: No evidence of bowel obstruction or ileus. Mild to moderate size probably loculated left pleural effusion is noted. Electronically Signed   By: Marijo Conception M.D.   On: 08/27/2019 12:24     Subjective: No acute issues or events overnight, occasional episodes of emesis as her baseline but otherwise denies nausea abdominal pain fevers chills.   Discharge Exam: Vitals:   09/04/19 1418 09/04/19 1444  BP: (!) 94/55 100/73  Pulse: 79 80  Resp: 17   Temp: 98.9 F (37.2 C) 99.5 F (37.5 C)  SpO2: 100% 100%   Vitals:   09/04/19 1200 09/04/19 1201 09/04/19 1418 09/04/19 1444  BP: (!) 88/67 (!) 88/67 (!) 94/55 100/73  Pulse: 86 86 79 80  Resp: 16 16 17    Temp: 99.1 F (37.3 C) 99.1 F (37.3 C) 98.9 F (37.2 C) 99.5 F (37.5 C)  TempSrc:  Oral Oral Oral  SpO2:  100% 100% 100%  Weight:      Height:        General: Cachectic appearing; alert, awake, not in acute  distress Cardiovascular: RRR, S1/S2 +, no rubs, no gallops Respiratory: CTA bilaterally, no wheezing, no rhonchi Abdominal: Soft, NT, ND, bowel sounds + Extremities: no edema, no cyanosis    The results of significant diagnostics from this hospitalization (including imaging, microbiology, ancillary and laboratory) are listed below for reference.     Microbiology: Recent Results (from the past 240 hour(s))  SARS Coronavirus 2 by RT PCR (hospital order, performed in [facility] hospital lab) Nasopharyngeal Nasopharyngeal Swab     Status: None   Collection Time: 08/30/19  7:04 PM  Specimen: Nasopharyngeal Swab  Result Value Ref Range Status   SARS Coronavirus 2 NEGATIVE NEGATIVE Final    Comment: (NOTE) SARS-CoV-2 target nucleic acids are NOT DETECTED.  The SARS-CoV-2 RNA is generally detectable in upper and lower respiratory specimens during the acute phase of infection. The lowest concentration of SARS-CoV-2 viral copies this assay can detect is 250 copies / mL. A negative result does not preclude SARS-CoV-2 infection and should not be used as the sole basis for treatment or other patient management decisions.  A negative result may occur with improper specimen collection / handling, submission of specimen other than nasopharyngeal swab, presence of viral mutation(s) within the areas targeted by this assay, and inadequate number of viral copies (<250 copies / mL). A negative result must be combined with clinical observations, patient history, and epidemiological information.  Fact Sheet for Patients:   StrictlyIdeas.no  Fact Sheet for Healthcare Providers: BankingDealers.co.za  This test is not yet approved or  cleared by the [place] FDA and has been authorized for detection and/or diagnosis of SARS-CoV-2 by FDA under an Emergency Use Authorization (EUA).  This EUA will remain in effect (meaning this test can be used) for  the duration of the COVID-19 declaration under Section 564(b)(1) of the Act, 21 U.S.C. section 360bbb-3(b)(1), unless the authorization is terminated or revoked sooner.  Performed at [facility], [place] [address], [place], [place] [address]      Labs: BNP (last 3 results) No results for input(s): BNP in the last 8760 hours. Basic Metabolic Panel: Recent Labs  Lab 08/30/19 1904 08/30/19 1917 08/31/19 0545 09/01/19 0500 09/03/19 0505  NA 131* 127* 128* 130* 129*  K 3.0* 2.9* 3.1* 3.2* 3.8  CL 70* 72* 78* 88* 96*  CO2 40*  --  34* 31 25  GLUCOSE 114* 113* 92 117* 103*  BUN 43* 41* 38* 23* 17  CREATININE 1.35* 1.50* 1.27* 0.91 0.78  CALCIUM 9.6  --  8.4* 8.4* 8.1*  MG 2.4  --   --   --   --    Liver Function Tests: Recent Labs  Lab 08/30/19 1904 08/31/19 0545 09/01/19 0500 09/03/19 0505  AST 20 16 14* 12*  ALT 21 17 13 10   ALKPHOS 153* 118 102 87  BILITOT 1.0 1.2 0.6 0.4  PROT 8.2* 6.6 6.6 6.3*  ALBUMIN 4.1 3.2* 3.2* 3.0*   Recent Labs  Lab 08/30/19 1904  LIPASE 31   No results for input(s): AMMONIA in the last 168 hours. CBC: Recent Labs  Lab 08/30/19 1904 08/30/19 1917 08/31/19 0545 09/01/19 0500 09/03/19 0505  WBC 4.6  --  3.3* 3.3* 3.8*  NEUTROABS 3.2  --   --   --   --   HGB 8.9* 9.5* 7.0* 7.1* 7.3*  HCT 26.6* 28.0* 21.5* 21.0* 22.1*  MCV 97.4  --  99.1 100.0 100.5*  PLT 173  --  140* 145* 165   Cardiac Enzymes: No results for input(s): CKTOTAL, CKMB, CKMBINDEX, TROPONINI in the last 168 hours. BNP: Invalid input(s): POCBNP CBG: Recent Labs  Lab 09/03/19 0752 09/03/19 1555 09/04/19 0008 09/04/19 0452 09/04/19 0805  GLUCAP 86 88 108* 96 94   D-Dimer No results for input(s): DDIMER in the last 72 hours. Hgb A1c No results for input(s): HGBA1C in the last 72 hours. Lipid Profile No results for input(s): CHOL, HDL, LDLCALC, TRIG, CHOLHDL, LDLDIRECT in the last 72 hours. Thyroid function studies No results for  input(s): TSH, T4TOTAL, T3FREE, THYROIDAB in  the last 72 hours.  Invalid input(s): FREET3 Anemia work up No results for input(s): VITAMINB12, FOLATE, FERRITIN, TIBC, IRON, RETICCTPCT in the last 72 hours. Urinalysis    Component Value Date/Time   COLORURINE YELLOW 08/30/2019 2036   APPEARANCEUR HAZY (A) 08/30/2019 2036   LABSPEC 1.041 (H) 08/30/2019 2036   LABSPEC 1.005 10/09/2015 1239   PHURINE 6.0 08/30/2019 2036   GLUCOSEU NEGATIVE 08/30/2019 2036   GLUCOSEU Negative 10/09/2015 1239   HGBUR NEGATIVE 08/30/2019 2036   BILIRUBINUR NEGATIVE 08/30/2019 2036   BILIRUBINUR Negative 10/09/2015 1239   KETONESUR 20 (A) 08/30/2019 2036   PROTEINUR 30 (A) 08/30/2019 2036   UROBILINOGEN 0.2 10/09/2015 1239   NITRITE NEGATIVE 08/30/2019 2036   LEUKOCYTESUR SMALL (A) 08/30/2019 2036   LEUKOCYTESUR Negative 10/09/2015 1239   Sepsis Labs Invalid input(s): PROCALCITONIN,  WBC,  LACTICIDVEN Microbiology Recent Results (from the past 240 hour(s))  SARS Coronavirus 2 by RT PCR (hospital order, performed in [place] hospital lab) Nasopharyngeal Nasopharyngeal Swab     Status: None   Collection Time: 08/30/19  7:04 PM   Specimen: Nasopharyngeal Swab  Result Value Ref Range Status   SARS Coronavirus 2 NEGATIVE NEGATIVE Final    Comment: (NOTE) SARS-CoV-2 target nucleic acids are NOT DETECTED.  The SARS-CoV-2 RNA is generally detectable in upper and lower respiratory specimens during the acute phase of infection. The lowest concentration of SARS-CoV-2 viral copies this assay can detect is 250 copies / mL. A negative result does not preclude SARS-CoV-2 infection and should not be used as the sole basis for treatment or other patient management decisions.  A negative result may occur with improper specimen collection / handling, submission of specimen other than nasopharyngeal swab, presence of viral mutation(s) within the areas targeted by this assay, and inadequate number of viral  copies (<250 copies / mL). A negative result must be combined with clinical observations, patient history, and epidemiological information.  Fact Sheet for Patients:   StrictlyIdeas.no  Fact Sheet for Healthcare Providers: BankingDealers.co.za  This test is not yet approved or  cleared by the [place] FDA and has been authorized for detection and/or diagnosis of SARS-CoV-2 by FDA under an Emergency Use Authorization (EUA).  This EUA will remain in effect (meaning this test can be used) for the duration of the COVID-19 declaration under Section 564(b)(1) of the Act, 21 U.S.C. section 360bbb-3(b)(1), unless the authorization is terminated or revoked sooner.  Performed at [facility], [place] [address], [place], [place] [address]      Time coordinating discharge: Over 30 minutes  SIGNED:   Little Ishikawa, DO [facility] 09/04/2019, 2:51 PM Pager   If 7PM-7AM, please contact night-coverage [email or web address]

## 2019-09-04 NOTE — Progress Notes (Signed)
Pt to be discharged to home this evening. Pt and Pt's Husband given discharge teaching including all Medications and schedules for these Medications. Understanding verbalized, Discharge packet with Pt at time of discharge

## 2019-09-04 NOTE — Progress Notes (Signed)
Hydrologist [facility])  Hospital Liaison: RN note         Notified by [facility] manager Dessa Phi, RN of patient/family request for [facility] Palliative services at home after discharge.            [facility] team will follow up with patient after discharge.         Please call with any hospice or palliative related questions.         Thank you for this referral.         Farrel Gordon, RN, CCM  [place] (listed on [place] under Hospice/[facility])    [phone]

## 2019-09-04 NOTE — Progress Notes (Signed)
Kathryn Ramsey   DOB:January 29, 1966   KP#:[id number]   NTZ#:[id number]  Subjective:  Kathryn Ramsey tells me she had a quiet night. Concerned her Hb is low this AM and she "wants to get home with some bounce." -- Requests transfusion. No family in room   Objective: white woman examined in bed Vitals:   09/03/19 2043 09/04/19 0450  BP: 102/74 97/63  Pulse: 76 69  Resp: 18 19  Temp: 98.3 F (36.8 C) 98.2 F (36.8 C)  SpO2: 100% 100%    Body mass index is 14.74 kg/m.  Intake/Output Summary (Last 24 hours) at 09/04/2019 0747 Last data filed at 09/04/2019 0700 Gross per 24 hour  Intake 1389.25 ml  Output 250 ml  Net 1139.25 ml     CBG (last 3)  Recent Labs    09/03/19 1555 09/04/19 0008 09/04/19 0452  GLUCAP 88 108* 96     Labs:  Lab Results  Component Value Date   WBC 3.8 (L) 09/03/2019   HGB 7.3 (L) 09/03/2019   HCT 22.1 (L) 09/03/2019   MCV 100.5 (H) 09/03/2019   PLT 165 09/03/2019   NEUTROABS 3.2 08/30/2019    _0 @  Urine Studies No results for input(s): UHGB, CRYS in the last 72 hours.  Invalid input(s): UACOL, UAPR, USPG, UPH, UTP, UGL, UKET, UBIL, UNIT, UROB, ULEU, UEPI, UWBC, URBC, UBAC, CAST, [place], [place]  Basic Metabolic Panel: Recent Labs  Lab 08/30/19 1904 08/30/19 1904 08/30/19 1917 08/30/19 1917 08/31/19 0545 08/31/19 0545 09/01/19 0500 09/03/19 0505  NA 131*  --  127*  --  128*  --  130* 129*  K 3.0*   < > 2.9*   < > 3.1*   < > 3.2* 3.8  CL 70*  --  72*  --  78*  --  88* 96*  CO2 40*  --   --   --  34*  --  31 25  GLUCOSE 114*  --  113*  --  92  --  117* 103*  BUN 43*  --  41*  --  38*  --  23* 17  CREATININE 1.35*  --  1.50*  --  1.27*  --  0.91 0.78  CALCIUM 9.6  --   --   --  8.4*  --  8.4* 8.1*  MG 2.4  --   --   --   --   --   --   --    < > = values in this interval not displayed.   GFR Estimated Creatinine Clearance: 46.1 mL/min (by C-G formula based on SCr of 0.78 mg/dL). Liver Function Tests: Recent Labs  Lab 08/30/19 1904  08/31/19 0545 09/01/19 0500 09/03/19 0505  AST 20 16 14* 12*  ALT _1 ALKPHOS 153* 118 102 87  BILITOT 1.0 1.2 0.6 0.4  PROT 8.2* 6.6 6.6 6.3*  ALBUMIN 4.1 3.2* 3.2* 3.0*   Recent Labs  Lab 08/30/19 1904  LIPASE 31   No results for input(s): AMMONIA in the last 168 hours. Coagulation profile No results for input(s): INR, PROTIME in the last 168 hours.  CBC: Recent Labs  Lab 08/30/19 1904 08/30/19 1917 08/31/19 0545 09/01/19 0500 09/03/19 0505  WBC 4.6  --  3.3* 3.3* 3.8*  NEUTROABS 3.2  --   --   --   --   HGB 8.9* 9.5* 7.0* 7.1* 7.3*  HCT 26.6* 28.0* 21.5* 21.0* 22.1*  MCV 97.4  --  99.1 100.0 100.5*  PLT 173  --  140* 145* 165   Cardiac Enzymes: No results for input(s): CKTOTAL, CKMB, CKMBINDEX, TROPONINI in the last 168 hours. BNP: Invalid input(s): POCBNP CBG: Recent Labs  Lab 09/02/19 2320 09/03/19 0752 09/03/19 1555 09/04/19 0008 09/04/19 0452  GLUCAP 100* 86 88 108* 96   D-Dimer No results for input(s): DDIMER in the last 72 hours. Hgb A1c No results for input(s): HGBA1C in the last 72 hours. Lipid Profile No results for input(s): CHOL, HDL, LDLCALC, TRIG, CHOLHDL, LDLDIRECT in the last 72 hours. Thyroid function studies No results for input(s): TSH, T4TOTAL, T3FREE, THYROIDAB in the last 72 hours.  Invalid input(s): FREET3 Anemia work up No results for input(s): VITAMINB12, FOLATE, FERRITIN, TIBC, IRON, RETICCTPCT in the last 72 hours. Microbiology Recent Results (from the past 240 hour(s))  SARS Coronavirus 2 by RT PCR (hospital order, performed in [facility] hospital lab) Nasopharyngeal Nasopharyngeal Swab     Status: None   Collection Time: 08/30/19  7:04 PM   Specimen: Nasopharyngeal Swab  Result Value Ref Range Status   SARS Coronavirus 2 NEGATIVE NEGATIVE Final    Comment: (NOTE) SARS-CoV-2 target nucleic acids are NOT DETECTED.  The SARS-CoV-2 RNA is generally detectable in upper and lower respiratory specimens during  the acute phase of infection. The lowest concentration of SARS-CoV-2 viral copies this assay can detect is 250 copies / mL. A negative result does not preclude SARS-CoV-2 infection and should not be used as the sole basis for treatment or other patient management decisions.  A negative result may occur with improper specimen collection / handling, submission of specimen other than nasopharyngeal swab, presence of viral mutation(s) within the areas targeted by this assay, and inadequate number of viral copies (<250 copies / mL). A negative result must be combined with clinical observations, patient history, and epidemiological information.  Fact Sheet for Patients:   StrictlyIdeas.no  Fact Sheet for Healthcare Providers: BankingDealers.co.za  This test is not yet approved or  cleared by the [place] FDA and has been authorized for detection and/or diagnosis of SARS-CoV-2 by FDA under an Emergency Use Authorization (EUA).  This EUA will remain in effect (meaning this test can be used) for the duration of the COVID-19 declaration under Section 564(b)(1) of the Act, 21 U.S.C. section 360bbb-3(b)(1), unless the authorization is terminated or revoked sooner.  Performed at [facility], [place] [address], [place], [place] [address]       Studies:  No results found.  Assessment: 54 y.o. BRCA negative [place] woman admitted 08/30/2019 with bowel obstruction  In setting of progressive peritoneal carcinomatosis  (0)  status post radical tumor debulking with optimal cytoreduction (R0) 03/27/2015 for a stage IIIC, high-grade right fallopian tube carcinoma             (a) baseline CA-125 was 1226.             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)              (c) tumor is PD-L1 negative (02/24/2016)             (d) tumor is strongly estrogen receptor positive, progesterone receptor negative (02/24/2016)  (1) adjuvant chemotherapy consisted of carboplatin and paclitaxel for 6 doses, begun 04/14/2015, completed 08/11/2015             (a) paclitaxel was omitted from cycle 5  and dose reduced on cycle 6 because of neuropathy              (b) a "make up" dose of paclitaxel was given 08/25/2015             (c) last carboplatin dose was 08/11/2015             (d) CA-125 normalized by 06/16/2015              (2) FIRST RECURRENCE: January 2018             (a) CA-125 rise beginning November 2017 led to CT scans and PET scans December 2017, all negative             (b) exploratory laparotomy 02/24/2016 showed pathologically confirmed recurrence, with miliary disease involving all examined surfaces             (c) 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             (a) cycle 3 delayed one week because of neutropenia; OnPro added             (b) CA 125 normalized after cycle 3  (4) RUCAPARIB maintenance started 07/02/2016             (a) restaging 10/01/2016: Normal CA 125, negative CT of the abdomen and pelvis             (b) labs 11/24/2016 shows a rise in her CA 125-43.4, with continuing rise thereafter             (c) rucaparib discontinued October 2018 with rising CA 125  (5) anastrozole started December 23, 2016-stopped after a couple of weeks with poor tolerance  SECOND RECURRENCE: (6) Gemcitabine/Bevacizumab started on 02/04/2017             (a) gemcitabine omitted cycle 4 because of intercurrent infection             (b) gemcitabine resumed with cycle 5, with  intervening rise in  Ca 125             (c) repeat CT scan of the abdomen and pelvis on 05/26/2017 does not confirm obvious disease progression             (d) cycle 5 of gemcitabine/bevacizumab delayed 1 week              (e) gemcitabine/ bevacizumab discontinued after 6 cycles, with rising CA 125 (last dose 06/24/2017)  (7) foundation one testing did not show a high mutation burden and the microsatellite status was stable.  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  These were not immediately actionable  (8) Abraxane started 07/12/2017, given weekly or weeks 1 and 2 of each 3 weeks cycle, last dose 11/15/2017 (5 cycles)             (a) cycle 2 started 08/09/2017, will be day 1 day 8 only             (b) cycle 3 scheduled to start 09/13/2017, day 1 day 8 at patient request             (c) CT of the abdomen and pelvis 11/28/2017 shows  no measurable disease             (d) PET scan 01/25/2018 and MRI of the pelvis 02/03/2018 show no measurable disease             (e) patient has symptomatic ascites and a rapidly rising Ca1 25  (9) started cisplatin/gemcitabine days 1 and 8 of each 21-day cycle on 02/18/2018             (a) switched to carboplatin/ gemcitabine 07/17/2018 due to neuropathy             (b) carbo/gemzar changed to Q14 days 07/24/2018 due to neutropenia             (c) considered niraparib/Zejula 100 mg, 2 tabs daily at the completion of chemotherapy  (10) associated problems             (a) 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 10/25/18 Consenting for AMV-564 clinical trial 21. 11/13/18 AMV-564 initiation 75 mcg dose 22. 10/8 Dropped down to 50 mcg dose (I think that date is correct) 23. 10/19-21/20 Tx held due to Neutropenia 24. 12/06/18 Given Neulasta 25. 12/07/18  AMV-564 resumed 26. 01/03/19 CT C/A/P 10% RECIST progression 27. 01/15/19 Increased dose to 75 mcg/daily 28. 01/17/19 Thoracentesis with cytology c/w [place] primary 29. Paracentesis 02/05/2019: cytology c/w adenocarcinoma  (12) schedule of thoracenteses:             (a) left 01/17/2019 [facility])             (b) left 02/06/2019 [facility])             (c) 05/21/2019 on the left [facility])  (13) schedule of paracenteses:             (a) 01/30/2019 ([place])             (b) 02/06/2019 ([place])             (c) 02/18/2018 ([place])             (d) 04/17/2019 ([place])             (e) additional procedures 04/30/2019, 05/24/2019  (14) abraxane started 02/20/2019, repeated days 1 and 8 of every 21 day cycle             (a) bevacizumab/Avastin started 02/20/2019 and repeated every 21 days             (b) She cannot tolerate Zarxio due to side effects consisting of low grade fever, body aches, malaise, fatigue, and itching, whereas she has been able to tolerate Granix though             (c) bevacizumab and Abraxane discontinued after 04/10/2019 dose with evidence of progression  (15) exemestane started 04/24/2019, discontinued after 2 weeks as patient opted for chemotherapy  (16) started liposomal doxorubicin/carboplatin 05/10/2019, repeated every 28 days             (a) echocardiogram 05/08/2017 shows an ejection fraction in the 55-60% range.             (b) patient is interested in [id number] if available through the "right to try" program             (c) HER-2 determination obtained on prior pathology 06/04/2019--negative results             (  d) considering [facility] , [facility] or [place] study  (e) doxil/carbo discontinued after 06/17/2021dose with evidence of progression    Plan:  As per our discussion yesterday documented in my note 09/03/2019, Maeson is eager to get home today. She requests transfusion prior to d/c and I have gone ahead and written the orders tom move things along., She will need to have home  IVF in place (can run NS at 125 cc/r for 12 hrs 8P - 8A); also wishes to have home PTx; both these should be obtainable throuogh home health. She does have a central line (port) in place,   I have written for concentrated MSO4 solution and disintegrating zofran and well as phenergan suppositories for pain and nausea. Any of her other medications may be continued or not at your discretion.  I will continue to follow closely as outpatient with a virtual visit this Friday 07/23 and mid next week.  Greatly appreciate your help to this patient and her family!  GM   Chauncey Cruel, MD 09/04/2019  7:47 AM Medical Oncology and Hematology [facility] [address] [place], [place] [address] Tel. [phone]    Fax. [phone]

## 2019-09-05 ENCOUNTER — Telehealth: Payer: Self-pay | Admitting: Internal Medicine

## 2019-09-05 ENCOUNTER — Telehealth: Payer: Self-pay | Admitting: Oncology

## 2019-09-05 ENCOUNTER — Other Ambulatory Visit: Payer: Self-pay | Admitting: *Deleted

## 2019-09-05 DIAGNOSIS — C5701 Malignant neoplasm of right fallopian tube: Secondary | ICD-10-CM

## 2019-09-05 DIAGNOSIS — D6481 Anemia due to antineoplastic chemotherapy: Secondary | ICD-10-CM

## 2019-09-05 DIAGNOSIS — C786 Secondary malignant neoplasm of retroperitoneum and peritoneum: Secondary | ICD-10-CM

## 2019-09-05 DIAGNOSIS — R1903 Right lower quadrant abdominal swelling, mass and lump: Secondary | ICD-10-CM

## 2019-09-05 DIAGNOSIS — Z95828 Presence of other vascular implants and grafts: Secondary | ICD-10-CM

## 2019-09-05 DIAGNOSIS — C762 Malignant neoplasm of abdomen: Secondary | ICD-10-CM

## 2019-09-05 DIAGNOSIS — K56691 Other complete intestinal obstruction: Secondary | ICD-10-CM

## 2019-09-05 DIAGNOSIS — R112 Nausea with vomiting, unspecified: Secondary | ICD-10-CM

## 2019-09-05 DIAGNOSIS — T451X5A Adverse effect of antineoplastic and immunosuppressive drugs, initial encounter: Secondary | ICD-10-CM

## 2019-09-05 DIAGNOSIS — G620 Drug-induced polyneuropathy: Secondary | ICD-10-CM

## 2019-09-05 DIAGNOSIS — D701 Agranulocytosis secondary to cancer chemotherapy: Secondary | ICD-10-CM

## 2019-09-05 LAB — BPAM RBC
Blood Product Expiration Date: 202108132359
ISSUE DATE / TIME: 202107201138
Unit Type and Rh: 6200

## 2019-09-05 LAB — TYPE AND SCREEN
ABO/RH(D): A POS
Antibody Screen: NEGATIVE
Unit division: 0

## 2019-09-05 NOTE — Telephone Encounter (Signed)
Scheduled apt per 7/20 sch msg - left message for patient with appt date and time

## 2019-09-05 NOTE — Telephone Encounter (Signed)
Scheduled [facility] Palliative visit for 09-17-19 at 3:00 per patient request.

## 2019-09-06 ENCOUNTER — Encounter: Payer: 59 | Admitting: Physical Therapy

## 2019-09-06 ENCOUNTER — Other Ambulatory Visit: Payer: Self-pay | Admitting: *Deleted

## 2019-09-06 ENCOUNTER — Inpatient Hospital Stay: Payer: 59

## 2019-09-06 ENCOUNTER — Other Ambulatory Visit: Payer: Self-pay | Admitting: Oncology

## 2019-09-06 ENCOUNTER — Other Ambulatory Visit: Payer: Self-pay

## 2019-09-06 ENCOUNTER — Telehealth: Payer: Self-pay | Admitting: *Deleted

## 2019-09-06 VITALS — BP 101/76 | HR 80 | Temp 97.6°F | Resp 18

## 2019-09-06 DIAGNOSIS — R531 Weakness: Secondary | ICD-10-CM | POA: Diagnosis not present

## 2019-09-06 DIAGNOSIS — R5383 Other fatigue: Secondary | ICD-10-CM | POA: Diagnosis not present

## 2019-09-06 DIAGNOSIS — C5701 Malignant neoplasm of right fallopian tube: Secondary | ICD-10-CM | POA: Diagnosis present

## 2019-09-06 DIAGNOSIS — Z85828 Personal history of other malignant neoplasm of skin: Secondary | ICD-10-CM | POA: Insufficient documentation

## 2019-09-06 DIAGNOSIS — R1114 Bilious vomiting: Secondary | ICD-10-CM | POA: Diagnosis not present

## 2019-09-06 DIAGNOSIS — T451X5A Adverse effect of antineoplastic and immunosuppressive drugs, initial encounter: Secondary | ICD-10-CM | POA: Diagnosis not present

## 2019-09-06 DIAGNOSIS — E876 Hypokalemia: Secondary | ICD-10-CM | POA: Insufficient documentation

## 2019-09-06 DIAGNOSIS — K769 Liver disease, unspecified: Secondary | ICD-10-CM | POA: Insufficient documentation

## 2019-09-06 DIAGNOSIS — Z95828 Presence of other vascular implants and grafts: Secondary | ICD-10-CM

## 2019-09-06 DIAGNOSIS — G620 Drug-induced polyneuropathy: Secondary | ICD-10-CM | POA: Insufficient documentation

## 2019-09-06 DIAGNOSIS — R188 Other ascites: Secondary | ICD-10-CM | POA: Insufficient documentation

## 2019-09-06 DIAGNOSIS — Z888 Allergy status to other drugs, medicaments and biological substances status: Secondary | ICD-10-CM | POA: Insufficient documentation

## 2019-09-06 DIAGNOSIS — R112 Nausea with vomiting, unspecified: Secondary | ICD-10-CM | POA: Insufficient documentation

## 2019-09-06 DIAGNOSIS — I700 Atherosclerosis of aorta: Secondary | ICD-10-CM | POA: Insufficient documentation

## 2019-09-06 DIAGNOSIS — Z170 Estrogen receptor positive status [ER+]: Secondary | ICD-10-CM | POA: Diagnosis not present

## 2019-09-06 DIAGNOSIS — Z87442 Personal history of urinary calculi: Secondary | ICD-10-CM | POA: Insufficient documentation

## 2019-09-06 DIAGNOSIS — D702 Other drug-induced agranulocytosis: Secondary | ICD-10-CM | POA: Insufficient documentation

## 2019-09-06 DIAGNOSIS — J9811 Atelectasis: Secondary | ICD-10-CM | POA: Diagnosis not present

## 2019-09-06 DIAGNOSIS — C786 Secondary malignant neoplasm of retroperitoneum and peritoneum: Secondary | ICD-10-CM | POA: Insufficient documentation

## 2019-09-06 DIAGNOSIS — Z9079 Acquired absence of other genital organ(s): Secondary | ICD-10-CM | POA: Insufficient documentation

## 2019-09-06 DIAGNOSIS — Z79899 Other long term (current) drug therapy: Secondary | ICD-10-CM | POA: Diagnosis not present

## 2019-09-06 DIAGNOSIS — C762 Malignant neoplasm of abdomen: Secondary | ICD-10-CM

## 2019-09-06 DIAGNOSIS — J439 Emphysema, unspecified: Secondary | ICD-10-CM | POA: Diagnosis not present

## 2019-09-06 DIAGNOSIS — Z882 Allergy status to sulfonamides status: Secondary | ICD-10-CM | POA: Diagnosis not present

## 2019-09-06 DIAGNOSIS — Z808 Family history of malignant neoplasm of other organs or systems: Secondary | ICD-10-CM | POA: Insufficient documentation

## 2019-09-06 DIAGNOSIS — Z823 Family history of stroke: Secondary | ICD-10-CM | POA: Insufficient documentation

## 2019-09-06 DIAGNOSIS — Z9049 Acquired absence of other specified parts of digestive tract: Secondary | ICD-10-CM | POA: Insufficient documentation

## 2019-09-06 DIAGNOSIS — Z8051 Family history of malignant neoplasm of kidney: Secondary | ICD-10-CM | POA: Insufficient documentation

## 2019-09-06 DIAGNOSIS — Z5111 Encounter for antineoplastic chemotherapy: Secondary | ICD-10-CM | POA: Diagnosis present

## 2019-09-06 DIAGNOSIS — J90 Pleural effusion, not elsewhere classified: Secondary | ICD-10-CM | POA: Insufficient documentation

## 2019-09-06 DIAGNOSIS — Z803 Family history of malignant neoplasm of breast: Secondary | ICD-10-CM | POA: Insufficient documentation

## 2019-09-06 DIAGNOSIS — Z8249 Family history of ischemic heart disease and other diseases of the circulatory system: Secondary | ICD-10-CM | POA: Insufficient documentation

## 2019-09-06 DIAGNOSIS — Z90722 Acquired absence of ovaries, bilateral: Secondary | ICD-10-CM | POA: Insufficient documentation

## 2019-09-06 MED ORDER — SODIUM CHLORIDE 0.9 % IV SOLN
Freq: Once | INTRAVENOUS | Status: DC
  Filled 2019-09-06: qty 250

## 2019-09-06 MED ORDER — SODIUM CHLORIDE 0.9 % IV SOLN
Freq: Once | INTRAVENOUS | Status: AC
  Filled 2019-09-06: qty 1000

## 2019-09-06 MED ORDER — HEPARIN SOD (PORK) LOCK FLUSH 100 UNIT/ML IV SOLN
250.0000 [IU] | Freq: Once | INTRAVENOUS | Status: AC
  Administered 2019-09-06: 500 [IU]
  Filled 2019-09-06: qty 5

## 2019-09-06 MED ORDER — SODIUM CHLORIDE 0.9% FLUSH
10.0000 mL | Freq: Once | INTRAVENOUS | Status: AC
  Administered 2019-09-06: 10 mL
  Filled 2019-09-06: qty 10

## 2019-09-06 NOTE — Telephone Encounter (Signed)
[facility] called stating IV fluids needed to be delivered to home for IV infusion to began. Reached out to [facility] from [place] and she will assist [facility] with fluid delivery. Contact information was given to [facility] at [facility] and they will proceed to start IV fluids tomorrow morning. As of today, pt will receive fluids here at the cancer center to start.

## 2019-09-06 NOTE — Progress Notes (Signed)
Kathryn Ramsey was sent home apparently with a home health agency that does not provide intravenous fluids.  We are bringing her in today for fluids and trying to change that.  She will be on D5 half-normal saline 1 L followed by D5 normal saline 500 cc, given at 125 cc an hour daily until further notice

## 2019-09-06 NOTE — Patient Instructions (Signed)
Dehydration, Adult Dehydration is a condition in which there is not enough water or other fluids in the body. This happens when a person loses more fluids than he or she takes in. Important organs, such as the kidneys, brain, and heart, cannot function without a proper amount of fluids. Any loss of fluids from the body can lead to dehydration. Dehydration can be mild, moderate, or severe. It should be treated right away to prevent it from becoming severe. What are the causes? Dehydration may be caused by:  Conditions that cause loss of water or other fluids, such as diarrhea, vomiting, or sweating or urinating a lot.  Not drinking enough fluids, especially when you are ill or doing activities that require a lot of energy.  Other illnesses and conditions, such as fever or infection.  Certain medicines, such as medicines that remove excess fluid from the body (diuretics).  Lack of safe drinking water.  Not being able to get enough water and food. What increases the risk? The following factors may make you more likely to develop this condition:  Having a long-term (chronic) illness that has not been treated properly, such as diabetes, heart disease, or kidney disease.  Being 65 years of age or older.  Having a disability.  Living in a place that is high in altitude, where thinner, drier air causes more fluid loss.  Doing exercises that put stress on your body for a long time (endurance sports). What are the signs or symptoms? Symptoms of dehydration depend on how severe it is. Mild or moderate dehydration  Thirst.  Dry lips or dry mouth.  Dizziness or light-headedness, especially when standing up from a seated position.  Muscle cramps.  Dark urine. Urine may be the color of tea.  Less urine or tears produced than usual.  Headache. Severe dehydration  Changes in skin. Your skin may be cold and clammy, blotchy, or pale. Your skin also may not return to normal after being  lightly pinched and released.  Little or no tears, urine, or sweat.  Changes in vital signs, such as rapid breathing and low blood pressure. Your pulse may be weak or may be faster than 100 beats a minute when you are sitting still.  Other changes, such as: ? Feeling very thirsty. ? Sunken eyes. ? Cold hands and feet. ? Confusion. ? Being very tired (lethargic) or having trouble waking from sleep. ? Short-term weight loss. ? Loss of consciousness. How is this diagnosed? This condition is diagnosed based on your symptoms and a physical exam. You may have blood and urine tests to help confirm the diagnosis. How is this treated? Treatment for this condition depends on how severe it is. Treatment should be started right away. Do not wait until dehydration becomes severe. Severe dehydration is an emergency and needs to be treated in a hospital.  Mild or moderate dehydration can be treated at home. You may be asked to: ? Drink more fluids. ? Drink an oral rehydration solution (ORS). This drink helps restore proper amounts of fluids and salts and minerals in the blood (electrolytes).  Severe dehydration can be treated: ? With IV fluids. ? By correcting abnormal levels of electrolytes. This is often done by giving electrolytes through a tube that is passed through your nose and into your stomach (nasogastric tube, or NG tube). ? By treating the underlying cause of dehydration. Follow these instructions at home: Oral rehydration solution If told by your health care provider, drink an ORS:  Make   an ORS by following instructions on the package.  Start by drinking small amounts, about  cup (120 mL) every 5-10 minutes.  Slowly increase how much you drink until you have taken the amount recommended by your health care provider. Eating and drinking         Drink enough clear fluid to keep your urine pale yellow. If you were told to drink an ORS, finish the ORS first and then start slowly  drinking other clear fluids. Drink fluids such as: ? Water. Do not drink only water. Doing that can lead to hyponatremia, which is having too little salt (sodium) in the body. ? Water from ice chips you suck on. ? Fruit juice that you have added water to (diluted fruit juice). ? Low-calorie sports drinks.  Eat foods that contain a healthy balance of electrolytes, such as bananas, oranges, potatoes, tomatoes, and spinach.  Do not drink alcohol.  Avoid the following: ? Drinks that contain a lot of sugar. These include high-calorie sports drinks, fruit juice that is not diluted, and soda. ? Caffeine. ? Foods that are greasy or contain a lot of fat or sugar. General instructions  Take over-the-counter and prescription medicines only as told by your health care provider.  Do not take sodium tablets. Doing that can lead to having too much sodium in the body (hypernatremia).  Return to your normal activities as told by your health care provider. Ask your health care provider what activities are safe for you.  Keep all follow-up visits as told by your health care provider. This is important. Contact a health care provider if:  You have muscle cramps, pain, or discomfort, such as: ? Pain in your abdomen and the pain gets worse or stays in one area (localizes). ? Stiff neck.  You have a rash.  You are more irritable than usual.  You are sleepier or have a harder time waking than usual.  You feel weak or dizzy.  You feel very thirsty. Get help right away if you have:  Any symptoms of severe dehydration.  Symptoms of vomiting, such as: ? You cannot eat or drink without vomiting. ? Vomiting gets worse or does not go away. ? Vomit includes blood or green matter (bile).  Symptoms that get worse with treatment.  A fever.  A severe headache.  Problems with urination or bowel movements, such as: ? Diarrhea that gets worse or does not go away. ? Blood in your stool (feces). This  may cause stool to look black and tarry. ? Not urinating, or urinating only a small amount of very dark urine, within 6-8 hours.  Trouble breathing. These symptoms may represent a serious problem that is an emergency. Do not wait to see if the symptoms will go away. Get medical help right away. Call your local emergency services (911 in the [place] Do not drive yourself to the hospital. Summary  Dehydration is a condition in which there is not enough water or other fluids in the body. This happens when a person loses more fluids than he or she takes in.  Treatment for this condition depends on how severe it is. Treatment should be started right away. Do not wait until dehydration becomes severe.  Drink enough clear fluid to keep your urine pale yellow. If you were told to drink an oral rehydration solution (ORS), finish the ORS first and then start slowly drinking other clear fluids.  Take over-the-counter and prescription medicines only as told by your health care   provider.  Get help right away if you have any symptoms of severe dehydration. This information is not intended to replace advice given to you by your health care provider. Make sure you discuss any questions you have with your health care provider. Document Revised: 09/14/2018 Document Reviewed: 09/14/2018 Elsevier Patient Education  2020 [employer]   

## 2019-09-07 ENCOUNTER — Telehealth: Payer: Self-pay | Admitting: *Deleted

## 2019-09-07 ENCOUNTER — Ambulatory Visit: Payer: 59

## 2019-09-07 ENCOUNTER — Other Ambulatory Visit: Payer: Self-pay | Admitting: Oncology

## 2019-09-07 ENCOUNTER — Telehealth: Payer: Self-pay

## 2019-09-07 ENCOUNTER — Inpatient Hospital Stay (HOSPITAL_BASED_OUTPATIENT_CLINIC_OR_DEPARTMENT_OTHER): Payer: 59 | Admitting: Oncology

## 2019-09-07 DIAGNOSIS — C801 Malignant (primary) neoplasm, unspecified: Secondary | ICD-10-CM

## 2019-09-07 DIAGNOSIS — C5701 Malignant neoplasm of right fallopian tube: Secondary | ICD-10-CM | POA: Diagnosis not present

## 2019-09-07 DIAGNOSIS — C786 Secondary malignant neoplasm of retroperitoneum and peritoneum: Secondary | ICD-10-CM

## 2019-09-07 MED ORDER — FENTANYL 12 MCG/HR TD PT72: 1.0000 | MEDICATED_PATCH | TRANSDERMAL | 0 refills | Status: AC

## 2019-09-07 MED ORDER — MORPHINE SULFATE (CONCENTRATE) 10 MG/0.5ML PO SOLN: 10.0000 mg | ORAL | 0 refills | Status: AC | PRN

## 2019-09-07 NOTE — Progress Notes (Signed)
[facility]  Telephone:([phone]) [phone] Fax:(336) [phone]    ID: Kathryn Ramsey DOB: [birth year]  MR#: [id number]  ACZ#:[id number]  Patient Care Team: Patient, No Pcp Per as PCP - General (General Practice) [name], MD as Consulting Physician (Obstetrics and Gynecology) Juanita Craver, MD as Consulting Physician (Gastroenterology) Servando Salina, MD as Consulting Physician (Obstetrics and Gynecology) Nickie Retort, MD as Consulting Physician (Urology) Gillis Ends, MD as Referring Physician (Obstetrics and Gynecology) Mettu, Baltazar Apo, MD as Referring Physician (Oncology) OTHER MD: Moss Mc [phone]); Floyde Parkins; Gerhard Perches Matulonis [phone])  NOTE: Patient does not want to be informed of her CA-125.  I connected with Kathryn Ramsey on 09/07/19 at 12:30 PM EDT by telephone visit and verified that I am speaking with the correct person using two identifiers.   I discussed the limitations, risks, security and privacy concerns of performing an evaluation and management service by telemedicine and the availability of in-person appointments. I also discussed with the patient that there may be a patient responsible charge related to this service. The patient expressed understanding and agreed to proceed.   Other persons participating in the visit and their role in the encounter: none  Patient's location: home  Provider's location: [place]:  ovarian cancer  CURRENT TREATMENT: Best supportive care   INTERVAL HISTORY: Kathryn Ramsey was contacted today for follow up of her metastatic ovarian cancer.   She was admitted to the hospital on 08/30/2019. From my note on 08/31/2019-- "[name] has had progressive weakness and weight loss despite or partly because of her chemotherapy-- last treatment 08/02/2019. In the last 2-3 days she has had uncontrolled vomiting, she was evaluated by Dr Collene Mares, and was directed to the  ED for possible ng placement. CT shows what seems to me obstruction in the 2d duodenum but also diffuse carcinomatiosis as previously noted. Several attempts at ng placement unsuccessful"  She was brought in yesterday, 09/06/2019, for fluids with the intention of sending her home with IV fluids as well.   REVIEW OF SYSTEMS: Ava tells me she is in a great deal of pain today. They had not picked up the morphine liquid or fentanyl patches. She tried tramadol but that really did not do anything for it. She tells me she has a pretty full head she says like she is congested. She is trying some Claritin for that. She tells me that the palliative care team from hospice has contacted them and may be coming today or within the next couple of days. Also we have arranged for her fluids to be delivered today and she has the "recipe" on how to receive them. Her port was left accessed when she was here for fluids yesterday. The home health nurse will teach her husband [name] how to hang them in a sterile fashion.    HISTORY OF PRESENT ILLNESS: From Dr. Serita Grit original intake note 03/17/2015:  "Kathryn Ramsey is a very pleasant G4P4 who is seen in consultation at the request of Dr Collene Mares and Dr Garwin Brothers for peritoneal carcinomatosis. The patient has a history of a workup by urology for gross hematuria which included a pelvic examine was suggested for extrinsic mass effect on the bladder on cystoscopy. Cystoscopy took place on in December 2016. The patient denies abdominal pain bloating early satiety or abdominal distention.  On 08/03/2015 at [facility] she underwent a CT scan of the abdomen and pelvis as ordered by Dr Baruch Gouty. This revealed a  1.4 cm low attenuation lesion on the posterior right hepatic lobe suggestive of a hemangioma. Status post hysterectomy. No ovarian masses. Moderate ascites. Omental caking seen in the lateral left abdomen and pelvis. Peritoneal nodularity in the left lateral pelvic cul-de-sac.  No gross extrinsic compression on the bladder was identified on imaging.  The patient was then seen by her gastroenterologist, Dr. Collene Mares, who performed a colonoscopy which was unremarkable.  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 She was treated adjuvantly with carboplatin and paclitaxel, as detailed below. Her CA 125 dropped from 1226 on 03/20/2015 to 26.1 by 06/16/2015.  Her subsequent history is as detailed above.   PAST MEDICAL HISTORY: Past Medical History:  Diagnosis Date  . Acute sensory neuropathy 06/26/2015  . Allergy   . Anemia   . Asthma    illness induced asthma  . Blood transfusion without reported diagnosis    at age 30 years old D/T surgery to femur being crushed  . 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 Heart murmur    pt, states had a "working heart murmur"  . History of kidney stones   . Neutropenia, drug-induced ([place]) 03/10/2018  . PONV (postoperative nausea and vomiting)   . Skin cancer     PAST SURGICAL HISTORY: Past Surgical History:  Procedure Laterality Date  . 3 laporscopic proceedures    . ABDOMINAL HYSTERECTOMY     2010  . CHOLECYSTECTOMY     2010  . DILATION AND CURETTAGE OF UTERUS     1997  . IR IMAGING GUIDED PORT INSERTION  03/08/2018  . LAPAROSCOPY N/A 03/27/2015   Procedure: DIAGNOSTIC LAPAROSCOPY ;  Surgeon: [name], MD;  Location: [facility];  Service: Gynecology;  Laterality: N/A;  . LAPAROSCOPY N/A 02/24/2016   Procedure: LAPAROSCOPY DIAGNOSTIC WITH PERITONEAL WASHINGS AND PERITONEAL BIOPSY;  Surgeon: [name], MD;  Location:  [facility];  Service: Gynecology;  Laterality: N/A;  . LAPAROTOMY N/A 03/27/2015   Procedure: EXPLORATORY LAPAROTOMY, BILATERAL SALPINGO OOPHORECTOMY, OMENTECTOMY, RADICAL TUMOR DEBULKING;  Surgeon: [name], MD;  Location: [facility];  Service: Gynecology;  Laterality: N/A;  . sinus surgery 1994      FAMILY HISTORY Family History  Problem Relation Age of Onset  . Hypertension Father   . Skin cancer Father        nonmelanoma skin cancers in his late 69s  . Non-Hodgkin's lymphoma Maternal Aunt        dx. 33s; smoker  . Stroke Maternal Grandmother   . Other Mother        benign meningioma dx. early-mid-70s; hysterectomy in her late 21s for heavy periods - still has ovaries  . Other Son        one son with pre-cancerous skin findings  . Other Daughter        cysts on ovaries and hx of heavy periods  . Other Sister        hysterectomy for cysts - still has ovaries  . Heart attack Maternal Grandfather   . Heart attack Paternal Grandfather   . Renal cancer Maternal Aunt 60  smoker  . Breast cancer Maternal Aunt 78  . Other Maternal Aunt        dx. benign brain tumor (meningioma) at 41; 2nd benign brain tumor in her late 73s; hx of radical hysterectomy at age 37  . Brain cancer Other        NOS tumor  The patient's parents are both living, in their late 54s as of January 2018. The patient has one brother, 3 sisters. There is no history of ovarian cancer in the family. One maternal aunt had breast and kidney cancer, another had non-Hodgkin's lymphoma.   GYNECOLOGIC HISTORY:  No LMP recorded. Patient has had a hysterectomy. 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 Her husband [name] is a Engineer, maintenance (IT). Their children are 27, 40, 29 and 73 y/o as of JAN 2018. The oldest works as an Electrical engineer in the [employer] in [place]. The other 3 children live in [place], the youngest currently  attending [employer]. The patient has no grandchildren. She attends [employer]    ADVANCED DIRECTIVES: [place] has completed advanced directives and they are filed.  Her husband [name] is her healthcare power of attorney.   HEALTH MAINTENANCE: Social History   Tobacco Use  . Smoking status: Never Smoker  . Smokeless tobacco: Never Used  Vaping Use  . Vaping Use: Never used  Substance Use Topics  . Alcohol use: Yes    Comment:  3 glasses wine or beer/week  . Drug use: No    Colonoscopy:2016  PAP: s/p hyst  Bone density:   Allergies  Allergen Reactions  . Bee Venom Shortness Of Breath    swelling  . Prochlorperazine Edisylate Anaphylaxis, Anxiety and Palpitations    Anaphylaxis, Anxiety and Palpitations   . Sulfa Antibiotics Shortness Of Breath    Vomit , diarrhea , hives  . Phenergan [Promethazine Hcl] Other (See Comments)    "funny sensation in my throat"  . Zarxio [Filgrastim]     Current Outpatient Medications  Medication Sig Dispense Refill  . acetaminophen (TYLENOL) 500 MG tablet Take 2 tablets by mouth 3 (three) times daily as needed for moderate pain.     [name] albuterol (VENTOLIN HFA) 108 (90 Base) MCG/ACT inhaler Inhale 2 puffs into the lungs every 6 (six) hours as needed for wheezing or shortness of breath.     . dicyclomine (BENTYL) 10 MG capsule TAKE 1 CAPSULE 4 TIMES DAILY BEFORE MEALS AND AT BEDTIME. (Patient taking differently: Take 10 mg by mouth in the morning, at noon, in the evening, and at bedtime. ) 120 capsule 0  . diphenhydrAMINE (BENADRYL) 50 MG capsule 50 mg Diphenhydramine (Benadryl) PO within 1 hour of the injection 1 capsule 0  . feeding supplement, ENSURE ENLIVE, (ENSURE ENLIVE) LIQD Take 237 mLs by mouth 3 (three) times daily after meals. 237 mL 12  . fentaNYL (DURAGESIC) 12 MCG/HR Place 1 patch onto the skin every 3 (three) days. 5 patch 0  . gabapentin (NEURONTIN) 100 MG capsule 200 mg bid with 300 mg qhs (Patient taking differently: Take  200 mg by mouth at bedtime. ) 630 capsule 1  . lactulose (CHRONULAC) 10 GM/15ML solution Take 10 g by mouth daily as needed for moderate constipation.     . lidocaine (LIDODERM) 5 % Place 1 patch onto the skin daily. Remove & Discard patch within 12 hours or as directed by MD    .  LORazepam (ATIVAN) 0.5 MG tablet TAKE 1 TABLET AT BEDTIME AS NEEDED FOR NAUSEA AND VOMITING. (Patient taking differently: Take 0.5 mg by mouth at bedtime as needed (nausea/vomiting). ) 20 tablet 0  . melatonin 3 MG TABS tablet Take 1 tablet (3 mg total) by mouth at bedtime. 30 tablet 0  . metoCLOPramide (REGLAN) 5 MG tablet Take 2 tablets (10 mg total) by mouth every 8 (eight) hours as needed for nausea. 30 tablet 2  . Morphine Sulfate (MORPHINE CONCENTRATE) 10 MG/0.5ML SOLN concentrated solution Take 0.5 mLs (10 mg total) by mouth every 2 (two) hours as needed for up to 3 days for moderate pain or shortness of breath. 30 mL 0  . omeprazole (PRILOSEC) 40 MG capsule Take 1 capsule (40 mg total) by mouth daily. 30 capsule 5  . sertraline (ZOLOFT) 50 MG tablet TAKE 1 & 1/2 TABLETS ONCE DAILY. (Patient taking differently: Take 75 mg by mouth daily. ) 45 tablet 0  . traMADol (ULTRAM) 50 MG tablet TAKE ONE TABLET EVERY 6 HOURS AS NEEDED. (Patient taking differently: Take 50 mg by mouth every 6 (six) hours as needed for moderate pain or severe pain. ) 60 tablet 0  . vitamin B-12 (CYANOCOBALAMIN) 1000 MCG tablet Take 1 tablet by mouth daily.     No current facility-administered medications for this visit.   Facility-Administered Medications Ordered in Other Visits  Medication Dose Route Frequency Provider Last Rate Last Admin  . sodium chloride flush (NS) 0.9 % injection 10 mL  10 mL Intravenous PRN Benen Weida, Virgie Dad, MD        OBJECTIVE:  There were no vitals filed for this visit.   There is no height or weight on file to calculate BMI.   There were no vitals filed for this visit.   ECOG FS:3 - Symptomatic, >50% confined  to bed  Telemedicine visit 09/07/2019    LAB RESULTS:  CMP     Component Value Date/Time   NA 129 (L) 09/03/2019 0505   NA 140 02/11/2017 0755   K 3.8 09/03/2019 0505   K 4.4 02/11/2017 0755   CL 96 (L) 09/03/2019 0505   CO2 25 09/03/2019 0505   CO2 26 02/11/2017 0755   GLUCOSE 103 (H) 09/03/2019 0505   GLUCOSE 93 02/11/2017 0755   BUN 17 09/03/2019 0505   BUN 7.9 02/11/2017 0755   CREATININE 0.78 09/03/2019 0505   CREATININE 1.02 (H) 08/27/2019 1125   CREATININE 0.7 02/11/2017 0755   CALCIUM 8.1 (L) 09/03/2019 0505   CALCIUM 9.6 02/11/2017 0755   PROT 6.3 (L) 09/03/2019 0505   PROT 7.5 02/11/2017 0755   ALBUMIN 3.0 (L) 09/03/2019 0505   ALBUMIN 4.4 02/11/2017 0755   AST 12 (L) 09/03/2019 0505   AST 18 08/27/2019 1125   AST 27 02/11/2017 0755   ALT 10 09/03/2019 0505   ALT 14 08/27/2019 1125   ALT 24 02/11/2017 0755   ALKPHOS 87 09/03/2019 0505   ALKPHOS 58 02/11/2017 0755   BILITOT 0.4 09/03/2019 0505   BILITOT 0.6 08/27/2019 1125   BILITOT 0.43 02/11/2017 0755   GFRNONAA >60 09/03/2019 0505   GFRNONAA >60 08/27/2019 1125   GFRAA >60 09/03/2019 0505   GFRAA >60 08/27/2019 1125    INo results found for: SPEP, UPEP  Lab Results  Component Value Date   WBC 3.8 (L) 09/03/2019   NEUTROABS 3.2 08/30/2019   HGB 7.3 (L) 09/03/2019   HCT 22.1 (L) 09/03/2019   MCV 100.5 (H) 09/03/2019  PLT 165 09/03/2019      Chemistry      Component Value Date/Time   NA 129 (L) 09/03/2019 0505   NA 140 02/11/2017 0755   K 3.8 09/03/2019 0505   K 4.4 02/11/2017 0755   CL 96 (L) 09/03/2019 0505   CO2 25 09/03/2019 0505   CO2 26 02/11/2017 0755   BUN 17 09/03/2019 0505   BUN 7.9 02/11/2017 0755   CREATININE 0.78 09/03/2019 0505   CREATININE 1.02 (H) 08/27/2019 1125   CREATININE 0.7 02/11/2017 0755      Component Value Date/Time   CALCIUM 8.1 (L) 09/03/2019 0505   CALCIUM 9.6 02/11/2017 0755   ALKPHOS 87 09/03/2019 0505   ALKPHOS 58 02/11/2017 0755   AST 12 (L)  09/03/2019 0505   AST 18 08/27/2019 1125   AST 27 02/11/2017 0755   ALT 10 09/03/2019 0505   ALT 14 08/27/2019 1125   ALT 24 02/11/2017 0755   BILITOT 0.4 09/03/2019 0505   BILITOT 0.6 08/27/2019 1125   BILITOT 0.43 02/11/2017 0755      No results found for: LABCA2  No components found for: LABCA125  No results for input(s): INR in the last 168 hours.  Urinalysis    Component Value Date/Time   COLORURINE YELLOW 08/30/2019 2036   APPEARANCEUR HAZY (A) 08/30/2019 2036   LABSPEC 1.041 (H) 08/30/2019 2036   LABSPEC 1.005 10/09/2015 1239   PHURINE 6.0 08/30/2019 2036   GLUCOSEU NEGATIVE 08/30/2019 2036   GLUCOSEU Negative 10/09/2015 1239   HGBUR NEGATIVE 08/30/2019 2036   BILIRUBINUR NEGATIVE 08/30/2019 2036   BILIRUBINUR Negative 10/09/2015 1239   KETONESUR 20 (A) 08/30/2019 2036   PROTEINUR 30 (A) 08/30/2019 2036   UROBILINOGEN 0.2 10/09/2015 1239   NITRITE NEGATIVE 08/30/2019 2036   LEUKOCYTESUR SMALL (A) 08/30/2019 2036   LEUKOCYTESUR Negative 10/09/2015 1239    STUDIES: CT ABDOMEN PELVIS W CONTRAST  Result Date: 08/30/2019 CLINICAL DATA:  Abdominal pain, bilious vomiting, ovarian cancer EXAM: CT ABDOMEN AND PELVIS WITH CONTRAST TECHNIQUE: Multidetector CT imaging of the abdomen and pelvis was performed using the standard protocol following bolus administration of intravenous contrast. CONTRAST:  21m OMNIPAQUE IOHEXOL 300 MG/ML  SOLN COMPARISON:  07/30/2019 FINDINGS: Lower chest: Moderate left pleural effusion is again identified with compressive atelectasis of the left lung base. Visualized right lung base is clear. Central venous catheter tip noted within the superior cavoatrial junction. Cardiac size within normal limits. Hepatobiliary: Cholecystectomy has been performed. Stable hemangioma within the a inferior right hepatic lobe. The liver is otherwise unremarkable. No intra or extrahepatic biliary ductal dilation identified. Pancreas: Unremarkable Spleen: Unremarkable  Adrenals/Urinary Tract: The adrenal glands are unremarkable. Mass effect upon the right kidney by the distended duodenum is again identified, however, the kidneys are otherwise unremarkable. There is normal symmetric renal cortical enhancement. The bladder appears circumferentially mildly thick walled and demonstrates moderate perivesicular inflammatory stranding, similar to prior examination, possibly reflecting a chronic inflammatory process such as a chronic infection or radiation cystitis in the appropriate clinical setting. The bladder is not distended. Stomach/Bowel: There is massive distension of the a first and second portion of the duodenum to the level of the a point at which it crosses the midline. In this region, however, there is reasonable space between the abdominal aorta and superior mesenteric artery making SMA syndrome unlikely as a cause of obstruction. There is, however, numerous thick walled loops of small bowel again identified clustered within the mid abdomen. There is moderate ascites contain within  thickened peritoneum, similar to that noted on prior examination. 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 No focal point of transition is identified, though the caliber of bowel does improve once the ileum exits the encapsulating peritoneum, best seen on axial image # 60/2. No free intraperitoneal gas. The large bowel is unremarkable. Vascular/Lymphatic: No pathologic adenopathy seen within the abdomen and pelvis. The inferior vena cava is compressed, progressive since prior examination, likely a combination of the encapsulating process as well as some degree of hypovolemia. The arterial vasculature is unremarkable. Reproductive: Uterus absent.  No adnexal masses. Other: There is diffuse body wall wasting noted, progressive since prior examination. Musculoskeletal: The osseous structures are  unremarkable. IMPRESSION: Diffuse bowel wall thickening and central clumping within encapsulating thickened peritoneum and moderate ascites. 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 This is most commonly seen in the setting of peritoneal dialysis or peritoneal carcinomatosis. Massive distention of the duodenum is progressive since prior examination. Stable chronic bladder wall thickening and perivesicular inflammatory stranding, possibly reflecting changes of a radiation cystitis in the appropriate clinical setting. Progressive body wall wasting. Electronically Signed   By: Fidela Salisbury MD   On: 08/30/2019 20:35   DG Abd 2 Views  Result Date: 08/27/2019 CLINICAL DATA:  Obstruction. EXAM: ABDOMEN - 2 VIEW COMPARISON:  None. FINDINGS: The bowel gas pattern is normal. Status post cholecystectomy. There is no evidence of free air. No radio-opaque calculi or other significant radiographic abnormality is seen. IMPRESSION: No evidence of bowel obstruction or ileus. Mild to moderate size probably loculated left pleural effusion is noted. Electronically Signed   By: Marijo Conception M.D.   On: 08/27/2019 12:24     ELIGIBLE FOR AVAILABLE RESEARCH PROTOCOL:  no  ASSESSMENT: 54 y.o. BRCA negative [place] woman status post radical tumor debulking with optimal cytoreduction (R0) 03/27/2015 for a stage IIIC, high-grade right fallopian tube carcinoma  (a) baseline CA-125 was 1226.  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)  (c) tumor is PD-L1 negative (02/24/2016)  (d) tumor is strongly estrogen receptor positive, progesterone receptor negative (02/24/2016)  (1) adjuvant chemotherapy consisted of carboplatin and paclitaxel for 6 doses, begun 04/14/2015, completed 08/11/2015  (a) paclitaxel was  omitted from cycle 5 and dose reduced on cycle 6 because of neuropathy   (b) a "make up" dose of paclitaxel was given 08/25/2015  (c) last carboplatin dose was 08/11/2015  (d) CA-125 normalized by 06/16/2015   (2) FIRST RECURRENCE: January 2018  (a) CA-125 rise beginning November 2017 led to CT scans and PET scans December 2017, all negative  (b) exploratory laparotomy 02/24/2016 showed pathologically confirmed recurrence, with miliary disease involving all examined surfaces  (c) port-site metastases noted 03/08/2016  (d) the January 2018 tumor sample was tested for the estrogen receptor and he was 80 percent positive with strong staining, progesterone receptor negative  (3) carboplatin/liposomal doxorubicin started 03/05/2016, repeated every three weeks x4, completed 06/04/2016.  (a) cycle 3 delayed one week because of neutropenia; OnPro added  (b) CA 125 normalized after cycle 3  (4) RUCAPARIB maintenance started 07/02/2016  (a) restaging 10/01/2016: Normal CA 125, negative CT of the abdomen and pelvis  (b) labs 11/24/2016 shows a rise in her CA 125-43.4, with continuing rise thereafter  (c) rucaparib discontinued October 2018  with rising CA 125  (5) anastrozole started December 23, 2016-stopped after a couple of weeks with poor tolerance  SECOND RECURRENCE: (6) Gemcitabine/Bevacizumab started on 02/04/2017  (a) gemcitabine omitted cycle 4 because of intercurrent infection  (b) gemcitabine resumed with cycle 5, with intervening rise in  Ca 125  (c) repeat CT scan of the abdomen and pelvis on 05/26/2017 does not confirm obvious disease progression  (d) cycle 5 of gemcitabine/bevacizumab delayed 1 week   (e) gemcitabine/ bevacizumab discontinued after 6 cycles, with rising CA 125 (last dose 06/24/2017)  (7) foundation one testing did not show a high mutation burden and the microsatellite status was stable.  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  These were not immediately  actionable  (8) Abraxane started 07/12/2017, given weekly or weeks 1 and 2 of each 3 weeks cycle, last dose 11/15/2017 (5 cycles)  (a) cycle 2 started 08/09/2017, will be day 1 day 8 only  (b) cycle 3 scheduled to start 09/13/2017, day 1 day 8 at patient request  (c) CT of the abdomen and pelvis 11/28/2017 shows no measurable disease  (d) PET scan 01/25/2018 and MRI of the pelvis 02/03/2018 show no measurable disease  (e) patient has symptomatic ascites and a rapidly rising Ca1 25  (9) started cisplatin/gemcitabine days 1 and 8 of each 21-day cycle on 02/18/2018  (a) switched to carboplatin/ gemcitabine 07/17/2018 due to neuropathy  (b) carbo/gemzar changed to Q14 days 07/24/2018 due to neutropenia  (c) considered niraparib/Zejula 100 mg, 2 tabs daily at the completion of chemotherapy  (10) associated problems  (a) anemia due to chemotherapy: On Aranesp  (b) 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 10/25/18 Consenting for AMV-564 clinical trial 21. 11/13/18 AMV-564 initiation 75 mcg dose 22. 10/8 Dropped down to 50 mcg dose (I think that date is correct) 23. 10/19-21/20 Tx held due to Neutropenia 24. 12/06/18 Given Neulasta 25. 12/07/18 AMV-564 resumed 26. 01/03/19 CT C/A/P 10% RECIST progression 27. 01/15/19 Increased dose to 75 mcg/daily 28. 01/17/19 Thoracentesis with cytology c/w [place] primary  29. Paracentesis 02/05/2019: cytology c/w adenocarcinoma  (12) schedule of thoracenteses:  (a) left 01/17/2019 [facility])  (b) left 02/06/2019 [facility])  (c) 05/21/2019 on the left [facility])  (13) schedule of paracenteses:  (a) 01/30/2019 ([place])  (b) 02/06/2019 ([place])  (c) 02/18/2018 ([place])  (d) 04/17/2019 ([place])  (e) additional procedures 04/30/2019, 05/24/2019  (14) abraxane started 02/20/2019, repeated days 1 and 8 of every 21 day cycle  (a)  bevacizumab/Avastin started 02/20/2019 and repeated every 21 days  (b) She cannot tolerate Zarxio due to side effects consisting of low grade fever, body aches, malaise, fatigue, and itching, whereas she has been able to tolerate Granix though  (c) bevacizumab and Abraxane discontinued after 04/10/2019 dose with evidence of progression  (15) exemestane started 04/24/2019, discontinued after 2 weeks as patient opted for chemotherapy  (16) started liposomal doxorubicin/carboplatin 05/10/2019, repeated every 28 days  (a) echocardiogram 05/08/2017 shows an ejection fraction in the 55-60% range.  (b) patient is interested in [id number] if available through the "right to try" program  (c) HER-2 determination requested on prior pathology 06/04/2019--negative results  (d) considering [facility] , [facility] or [place] study   (e) doxil/carbo discontinued after 08/02/2019 dose with evidence of progression   PLAN: Stanton Kidney is declining rapidly. I do not think she will live another week. My concern at present is  that she be comfortable. We are going to give her fluids which I think will help a little. Specifically she will receive D5 half-normal a liter followed by D5 normal 500 cc on a daily basis, and then an additional 500 of D5 normal saline if she does a lot of vomiting or has diarrhea.  For pain she is being started on a fentanyl patch 12.5 mg to be changed every 3 days and she has morphine elixir to take every 2 hours as needed.  For nausea she has Phenergan suppositories and ODT Zofran available. They were not able to place an NG tube when she was admitted to the ED despite several attempts and she is not a candidate for PEG tube.  Shaune has my phone number and knows how to contact me as needed. If she cannot be comfortable at home she may need to be moved to a hospice home where she could possibly have an NG tube placed although again we tried and were not able to pass it out in the ED despite several  attempts   Sarajane Jews C. Salinda Snedeker, MD 09/07/19 1:04 PM Medical Oncology and Hematology [facility] [place], [place] [address] Tel. [phone]    Fax. [phone]   I, Wilburn Mylar, am acting as scribe for Dr. Virgie Dad. Jhon Mallozzi.  I, Lurline Del MD, have reviewed the above documentation for accuracy and completeness, and I agree with the above.    *Total Encounter Time as defined by the Centers for Medicare and Medicaid Services includes, in addition to the face-to-face time of a patient visit (documented in the note above) non-face-to-face time: obtaining and reviewing outside history, ordering and reviewing medications, tests or procedures, care coordination (communications with other health care professionals or caregivers) and documentation in the medical record.

## 2019-09-07 NOTE — Telephone Encounter (Signed)
This RN contacted Palliative [facility] per MD request at 115pm today requesting to speak to a nurse - was informed they had VM only.  VM left per MD concern for pt to be seen asap due to likely progressive decline- noted pt had scheduled palliative to visit for 09/17/2019.  This RN obtained a return call late in the day and spoke to Palliative nurse per above - appointment will be rescheduled.

## 2019-09-07 NOTE — Telephone Encounter (Signed)
RN contacted Val at MD office per her request.  APpt for palliative care scheduled for 8/2, Val requests a sooner appt. RN notified scheduling/admin team to reschedule.

## 2019-09-07 NOTE — Telephone Encounter (Signed)
This RN spoke with Kathryn Ramsey and faxed orders per MD for home IV therapy per MD to [phone] with confirmation.  Pam states they will be sending fluids out today per above.  This RN informed pt as well of above communication.

## 2019-09-10 ENCOUNTER — Telehealth: Payer: Self-pay

## 2019-09-10 ENCOUNTER — Telehealth: Payer: Self-pay | Admitting: Internal Medicine

## 2019-09-10 ENCOUNTER — Other Ambulatory Visit: Payer: Self-pay | Admitting: Oncology

## 2019-09-10 NOTE — Telephone Encounter (Signed)
Rec'd message that Val from Dr. Virgie Dad office called requesting patient be seen sooner that 09/17/19 for Palliative services.  Called patient to reschedule Initial Consult, no answer - left message with reason for call along with my name and contact number to reschedule.

## 2019-09-10 NOTE — Telephone Encounter (Signed)
Spoke with patient regarding visit for tomorrow. Patient in agreement with this. Scheduled for 11am with Violeta Gelinas NP.

## 2019-09-10 NOTE — Progress Notes (Signed)
I checked with Kathryn Ramsey husband today.  She is having a little bit more pain but she is not yet using the morphine or fentanyl.  She is using Tylenol with codeine at present.  The tramadol did not work.  She is vomiting about twice daily.  She is getting weaker and he had to carry her to bed last night after she had a long day.  He is giving her the fluids but all they gave him was D5 normal saline.  We discussed the fact that if we do not do some free water she is going to get hyponatremic.  We reviewed the fact that she needs 2 bags of D5 half-normal followed by 1 bag of D5 normal and those were the orders we called in but for some reason does not want was delivered we are working on trying to get that fixed.  We will check a be met on Thursday, July 29 to make sure electrolytes are appropriate.  The palliative team however has not yet shown up.

## 2019-09-10 NOTE — Telephone Encounter (Signed)
Left message for patient to offer to schedule a visit sooner than 09/17/19. Message sent to Val with Dr. Virgie Dad office to provide update.

## 2019-09-11 ENCOUNTER — Other Ambulatory Visit: Payer: Self-pay

## 2019-09-11 ENCOUNTER — Other Ambulatory Visit: Payer: 59 | Admitting: Internal Medicine

## 2019-09-11 DIAGNOSIS — Z515 Encounter for palliative care: Secondary | ICD-10-CM

## 2019-09-11 DIAGNOSIS — Z7189 Other specified counseling: Secondary | ICD-10-CM

## 2019-09-11 NOTE — Progress Notes (Addendum)
July 27th, 2021 [facility] Palliative Care Consult Note Telephone: [phone]  Fax: [phone]  PATIENT NAME: Kathryn Ramsey DOB: Mar 24, 1965 MRN: [id number]  [place]  [place] [address]  REFERRING / PRIMARY PROVIDER: Dr. Sarajane Jews Magrinat  RESPONSIBLE PARTY:   Yaden,Lloyd "Tom" (Spouse)  [phone] (Mobile)  ASSESSMENT / RECOMMENDATIONS:  1. Advance Care Planning: A. Directives: HCPOA (#1 [place]ocker [place] [phone], #2 [place] Blocker IV [phone]) in [facility] EMR. Patient decision, in the presence of her spouse and children, to forego use of cardiopulmonary resuscitation in the event of a cardiopulmonary arrest. I completed 2 DNR forms and recommended they place one form on the fridge, where EMS would look for it should they be summoned to the home. I uploaded into [facility] EMR.  B. Goals of Care:  Patient doesn't want to go to the hospital. She fears that her children wouldn't be able to visit her there.  We discussed scope of Hospice Services. Patient has some familiarity with the program. They wish to know if IVFs would be covered under this service. I checked with our hospice physician Dr. Lyman Speller who endorsed they would. We also discussed [place] which would be an option if symptom management became too difficult in the home setting.   Patient's spouse asked me privately what I felt her prognosis to be. I shared my guess of short weeks. We also discussed logistics of what to do if patient passes, and she is not under hospice services (call EMS, let them know not an unexpected death with DNR in place in the home. EMS would pronounce time of death, and call funeral home. Funeral home would pick up body and call the attending to sign the death certificate).   2. Cognitive / Functional status, Symptom Management: Patient is pleasant; oriented x 3. Knowledgeable regarding current state of health and poor prognosis.   Patient is very  weak; needs steadying assist to transfer and ambulate (rollator walker).  Known obstructing/near obstructing distal duodenum. Unsuccessful recent ER attempts (multi) at NG tube placement. She is not a PEG candidate. Patient is having difficulty keeping down fluids. Emesis about every few hours or so. Estimated that she vomits a little more than 50% of what she drinks. She may or may not have nausea, but if she does seems relieved after emesis. She has dipped into her prn Zofran but not sure if that is helping, or if she just feels better anyway after vomiting. She has prn Ativan available as well. Past trial of full 0.5mg  dose caused excessive somnolence; we discussed she could break tab in half. She's not eating any solid foods. She doesn't like Ensure/Boost. We discussed trialing Carnation Instant Breakfast with her lactose free milk. Her mouth feels inflamed since irritated by some liquid Zoloft she had in the hospital. She is dreadfully thin. She now has IVFs in the home 1 liter of D5  NS alternating with 58ml D5 NS both at 171ml/hr via her port-a-cath. SN ([facility]), for IVF supply ([facility]). Patient's family have become very proficient / independent with changing out the IVF bags.  Luckily, she currently has just mild abdominal pain managed with prn Tylenol with codeine. She has liquid MSO4 available but is a little fearful of taking this. She also has a Fentanyl patch available (not yet used) but I'm not sure she will get much trans cutaneous absorption as she has little adipose tissue to facilitate absorption. Discussed that the Tylenol with  codeine may be contributing to her stomach upset. That the morphine may be better tolerated.  She takes Neurontin for neuropahtic extremity pain from chemo, and Lidoderm patch for pack pain.   3. Family / Community Supports: Retired Futures trader. Her husband [name] is a Engineer, maintenance (IT). Their 4 children are in their 20's. Church is [employer].  4. Follow  up Palliative Care Visit: I will call in a few days to check if family would wish a home visit, to f/u on symptom management, and if any decision regarding hospice enrollment.   I spent 60 minutes providing this consultation from 11am-noon. More than 50% of the time in this consultation was spent coordinating communication.   HISTORY OF PRESENT ILLNESS:  Kathryn Ramsey is a very sweet 54 y.o. female with metastatic ovarian cancer -Hospitalized 08/2019 CT: obstruction 2d duodenum. Unsuccessful attempts NG tube placement.   Palliative Care was asked to help address goals of care.   CODE STATUS: DNR  PPS: 20% to weak 30%  HOSPICE ELIGIBILITY/DIAGNOSIS: yes/metastatic ovarian cancer PAST MEDICAL HISTORY:  Past Medical History:  Diagnosis Date  . Acute sensory neuropathy 06/26/2015  . Allergy   . Anemia   . Asthma    illness induced asthma  . Blood transfusion without reported diagnosis    at age 1 years old D/T surgery to femur being crushed  . 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 Heart murmur    pt, states had a "working heart murmur"  . History of kidney stones   . Neutropenia, drug-induced ([facility]) 03/10/2018  . PONV (postoperative nausea and vomiting)   . Skin cancer     SOCIAL HX:  Social History   Tobacco Use  . Smoking status: Never Smoker  . Smokeless tobacco: Never Used  Substance Use Topics  . Alcohol use: Yes    Comment:  3 glasses wine or beer/week    ALLERGIES:  Allergies  Allergen Reactions  . Bee Venom Shortness Of Breath    swelling  . Prochlorperazine Edisylate Anaphylaxis, Anxiety and Palpitations    Anaphylaxis, Anxiety and Palpitations   . Sulfa Antibiotics Shortness Of Breath    Vomit , diarrhea , hives  . Phenergan [Promethazine Hcl] Other (See Comments)    "funny sensation in my throat"  . Zarxio [Filgrastim]      PERTINENT MEDICATIONS:  Outpatient Encounter Medications as  of 09/11/2019  Medication Sig  . acetaminophen (TYLENOL) 500 MG tablet Take 2 tablets by mouth 3 (three) times daily as needed for moderate pain.   [name] albuterol (VENTOLIN HFA) 108 (90 Base) MCG/ACT inhaler Inhale 2 puffs into the lungs every 6 (six) hours as needed for wheezing or shortness of breath.   . dicyclomine (BENTYL) 10 MG capsule TAKE 1 CAPSULE 4 TIMES DAILY BEFORE MEALS AND AT BEDTIME. (Patient taking differently: Take 10 mg by mouth in the morning, at noon, in the evening, and at bedtime. )  . gabapentin (NEURONTIN) 100 MG capsule 200 mg bid with 300 mg qhs (Patient taking differently: Take 200 mg by mouth at bedtime. )  . lidocaine (LIDODERM) 5 % Place 1 patch onto the skin daily. Remove & Discard patch within 12 hours or as directed by MD  . LORazepam (ATIVAN) 0.5 MG tablet TAKE 1 TABLET AT BEDTIME AS NEEDED FOR NAUSEA AND VOMITING. (Patient taking differently: Take 0.5 mg by mouth at bedtime as needed (nausea/vomiting). )  .  omeprazole (PRILOSEC) 40 MG capsule Take 1 capsule (40 mg total) by mouth daily.  . sertraline (ZOLOFT) 50 MG tablet TAKE 1&1/2 TABLETS ONCE DAILY.  . traMADol (ULTRAM) 50 MG tablet TAKE ONE TABLET EVERY 6 HOURS AS NEEDED. (Patient taking differently: Take 50 mg by mouth every 6 (six) hours as needed for moderate pain or severe pain. )  . diphenhydrAMINE (BENADRYL) 50 MG capsule 50 mg Diphenhydramine (Benadryl) PO within 1 hour of the injection  . feeding supplement, ENSURE ENLIVE, (ENSURE ENLIVE) LIQD Take 237 mLs by mouth 3 (three) times daily after meals. (Patient not taking: Reported on 09/12/2019)  . fentaNYL (DURAGESIC) 12 MCG/HR Place 1 patch onto the skin every 3 (three) days. (Patient not taking: Reported on 09/12/2019)  . lactulose (CHRONULAC) 10 GM/15ML solution Take 10 g by mouth daily as needed for moderate constipation.  (Patient not taking: Reported on 09/12/2019)  . melatonin 3 MG TABS tablet Take 1 tablet (3 mg total) by mouth at bedtime.  .  metoCLOPramide (REGLAN) 5 MG tablet Take 2 tablets (10 mg total) by mouth every 8 (eight) hours as needed for nausea.  . vitamin B-12 (CYANOCOBALAMIN) 1000 MCG tablet Take 1 tablet by mouth daily.   Facility-Administered Encounter Medications as of 09/11/2019  Medication  . sodium chloride flush (NS) 0.9 % injection 10 mL    GENERAL  EXAM:   Frail, cachectic, very sweet middle aged female sitting up on the couch. Emesis bag close at hand. She is alert and oriented x 3, and pleasantly conversant. She has good knowledge and insight into her current condition. Her spouse and children are all in attendance. Rollator walker at hand.  Julianne Handler, NP  [place]

## 2019-09-12 ENCOUNTER — Inpatient Hospital Stay (HOSPITAL_BASED_OUTPATIENT_CLINIC_OR_DEPARTMENT_OTHER): Payer: 59 | Admitting: Oncology

## 2019-09-12 ENCOUNTER — Encounter: Payer: Self-pay | Admitting: Internal Medicine

## 2019-09-12 DIAGNOSIS — C801 Malignant (primary) neoplasm, unspecified: Secondary | ICD-10-CM | POA: Diagnosis not present

## 2019-09-12 DIAGNOSIS — C786 Secondary malignant neoplasm of retroperitoneum and peritoneum: Secondary | ICD-10-CM | POA: Diagnosis not present

## 2019-09-12 NOTE — Progress Notes (Signed)
[place]  [phone]) [phone] Fax:(336) [phone]    ID: Kathryn Ramsey DOB: [birth year]  MR#: [id number]  HAL#:[id number]  Patient Care Team: Patient, No Pcp Per as PCP - General (General Practice) [name], MD as Consulting Physician (Obstetrics and Gynecology) Juanita Craver, MD as Consulting Physician (Gastroenterology) Servando Salina, MD as Consulting Physician (Obstetrics and Gynecology) Nickie Retort, MD as Consulting Physician (Urology) Gillis Ends, MD as Referring Physician (Obstetrics and Gynecology) Mettu, Baltazar Apo, MD as Referring Physician (Oncology) OTHER MD: Moss Mc [phone]); Floyde Parkins; Gerhard Perches Mat[place]is [phone])  NOTE: Patient does not want to be informed of her CA-125.  I connected with Kathryn Ramsey on 09/12/19 at  3:15 PM EDT by telephone visit and verified that I am speaking with the correct person using two identifiers.   I discussed the limitations, risks, security and privacy concerns of performing an evaluation and management service by telemedicine and the availability of in-person appointments. I also discussed with the patient that there may be a patient responsible charge related to this service. The patient expressed understanding and agreed to proceed.   Other persons participating in the visit and their role in the encounter: Husband Tom  Patient's location: home  Provider's location: [place]:  ovarian cancer  CURRENT TREATMENT: Best supportive care   INTERVAL HISTORY: Kathryn Ramsey was contacted today for follow up of her metastatic ovarian cancer.   She was visited out today by the palliative care nurse from hospice.  She called [name] then discussed the case.  They were offered for hospice but at this point they declined.  The nurse did speak with [name] and privately gave him her impression that [name] is not likely to last 2 weeks  REVIEW OF  SYSTEMS: [name] tells me things are going "good".  They are now receiving the fluids as prescribed.  She was getting a salty taste in her mouth and she no longer has that.  She is urinating she thinks twice daily, amber-colored.  She is vomiting multiple times a day, usually small amounts.  She does something that she can use treatments.  What concerns her most is that her abdomen is getting very tight like is she she says.  She does have all her family they are which is a wonderful thing.  Her pain is not severe at this point and she is not using the morphine or fentanyl.  She is using Tylenol 3 at this point.    HISTORY OF PRESENT ILLNESS: From Dr. Serita Grit original intake note 03/17/2015:  "Kathryn Ramsey is a very pleasant G4P4 who is seen in consultation at the request of Dr Collene Mares and Dr Garwin Brothers for peritoneal carcinomatosis. The patient has a history of a workup by urology for gross hematuria which included a pelvic examine was suggested for extrinsic mass effect on the bladder on cystoscopy. Cystoscopy took place on in December 2016. The patient denies abdominal pain bloating early satiety or abdominal distention.  On 08/03/2015 at [facility] she underwent a CT scan of the abdomen and pelvis as ordered by Dr Baruch Gouty. This revealed a 1.4 cm low attenuation lesion on the posterior right hepatic lobe suggestive of a hemangioma. Status post hysterectomy. No ovarian masses. Moderate ascites. Omental caking seen in the lateral left abdomen and pelvis. Peritoneal nodularity in the left lateral pelvic cul-de-sac. No gross extrinsic compression on the bladder was identified on imaging.  The patient was then seen by  her gastroenterologist, Dr. Collene Mares, who performed a colonoscopy which was unremarkable.  Tumor markers were drawn on 08/04/2015 and these included a CA-125 that was elevated to 957, and a CEA that was normal at 1."  On 03/27/2015 the patient underwent diagnostic laparoscopy, exploratory  laparotomy with bilateral salpingo-oophorectomy, omentectomy, and radical tumor debulking with optimal cytoreduction (R0) of what proved to be a stage IIIc right fallopian tube cancer. She was treated adjuvantly with carboplatin and paclitaxel, as detailed below. Her CA 125 dropped from 1226 on 03/20/2015 to 26.1 by 06/16/2015.  Her subsequent history is as detailed above.   PAST MEDICAL HISTORY: Past Medical History:  Diagnosis Date  . Acute sensory neuropathy 06/26/2015  . Allergy   . Anemia   . Asthma    illness induced asthma  . Blood transfusion without reported diagnosis    at age 80 years old D/T surgery to femur being crushed  . 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 Heart murmur    pt, states had a "working heart murmur"  . History of kidney stones   . Neutropenia, drug-induced ([place]) 03/10/2018  . PONV (postoperative nausea and vomiting)   . Skin cancer     PAST SURGICAL HISTORY: Past Surgical History:  Procedure Laterality Date  . 3 laporscopic proceedures    . ABDOMINAL HYSTERECTOMY     2010  . CHOLECYSTECTOMY     2010  . DILATION AND CURETTAGE OF UTERUS     1997  . IR IMAGING GUIDED PORT INSERTION  03/08/2018  . LAPAROSCOPY N/A 03/27/2015   Procedure: DIAGNOSTIC LAPAROSCOPY ;  Surgeon: [name], MD;  Location: [facility];  Service: Gynecology;  Laterality: N/A;  . LAPAROSCOPY N/A 02/24/2016   Procedure: LAPAROSCOPY DIAGNOSTIC WITH PERITONEAL WASHINGS AND PERITONEAL BIOPSY;  Surgeon: [name], MD;  Location: [facility];  Service: Gynecology;  Laterality: N/A;  . LAPAROTOMY N/A 03/27/2015   Procedure: EXPLORATORY LAPAROTOMY, BILATERAL SALPINGO OOPHORECTOMY, OMENTECTOMY, RADICAL TUMOR DEBULKING;  Surgeon: [name], MD;  Location: [facility];  Service: Gynecology;  Laterality: N/A;  . sinus surgery 1994      FAMILY HISTORY Family History  Problem Relation Age of Onset  . Hypertension Father   . Skin  cancer Father        nonmelanoma skin cancers in his late 37s  . Non-Hodgkin's lymphoma Maternal Aunt        dx. 55s; smoker  . Stroke Maternal Grandmother   . Other Mother        benign meningioma dx. early-mid-70s; hysterectomy in her late 59s for heavy periods - still has ovaries  . Other Son        one son with pre-cancerous skin findings  . Other Daughter        cysts on ovaries and hx of heavy periods  . Other Sister        hysterectomy for cysts - still has ovaries  . Heart attack Maternal Grandfather   . Heart attack Paternal Grandfather   . Renal cancer Maternal Aunt 60       smoker  . Breast cancer Maternal Aunt 78  . Other Maternal Aunt        dx. benign brain tumor (meningioma) at 104; 2nd benign brain tumor in her late 11s; hx of radical hysterectomy at age 46  . Brain cancer Other        NOS tumor  The patient's parents are both living, in their late 71s as of January 2018. The patient has one brother, 3 sisters. There is no history of ovarian cancer in the family. One maternal aunt had breast and kidney cancer, another had non-Hodgkin's lymphoma.   GYNECOLOGIC HISTORY:  No LMP recorded. Patient has had a hysterectomy. 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 Her husband [name] is a Engineer, maintenance (IT). Their children are 85, 15, 98 and 39 y/o as of JAN 2018. The oldest works as an Electrical engineer in the [employer] in [place]. The other 3 children live in [place], the youngest currently attending [employer]. The patient has no grandchildren. She attends [employer]    ADVANCED DIRECTIVES: [place] has completed advanced directives and they are filed.  Her husband [name] is her healthcare power of attorney.   HEALTH MAINTENANCE: Social History   Tobacco Use  . Smoking status: Never Smoker  . Smokeless tobacco: Never Used  Vaping Use  . Vaping Use: Never used    Substance Use Topics  . Alcohol use: Yes    Comment:  3 glasses wine or beer/week  . Drug use: No    Colonoscopy: 2016  PAP: s/p hyst  Bone density:   Allergies  Allergen Reactions  . Bee Venom Shortness Of Breath    swelling  . Prochlorperazine Edisylate Anaphylaxis, Anxiety and Palpitations    Anaphylaxis, Anxiety and Palpitations   . Sulfa Antibiotics Shortness Of Breath    Vomit , diarrhea , hives  . Phenergan [Promethazine Hcl] Other (See Comments)    "funny sensation in my throat"  . Zarxio [Filgrastim]     Current Outpatient Medications  Medication Sig Dispense Refill  . acetaminophen (TYLENOL) 500 MG tablet Take 2 tablets by mouth 3 (three) times daily as needed for moderate pain.     [name] albuterol (VENTOLIN HFA) 108 (90 Base) MCG/ACT inhaler Inhale 2 puffs into the lungs every 6 (six) hours as needed for wheezing or shortness of breath.     . dicyclomine (BENTYL) 10 MG capsule TAKE 1 CAPSULE 4 TIMES DAILY BEFORE MEALS AND AT BEDTIME. (Patient taking differently: Take 10 mg by mouth in the morning, at noon, in the evening, and at bedtime. ) 120 capsule 0  . diphenhydrAMINE (BENADRYL) 50 MG capsule 50 mg Diphenhydramine (Benadryl) PO within 1 hour of the injection 1 capsule 0  . feeding supplement, ENSURE ENLIVE, (ENSURE ENLIVE) LIQD Take 237 mLs by mouth 3 (three) times daily after meals. (Patient not taking: Reported on 09/12/2019) 237 mL 12  . fentaNYL (DURAGESIC) 12 MCG/HR Place 1 patch onto the skin every 3 (three) days. (Patient not taking: Reported on 09/12/2019) 5 patch 0  . gabapentin (NEURONTIN) 100 MG capsule 200 mg bid with 300 mg qhs (Patient taking differently: Take 200 mg by mouth at bedtime. ) 630 capsule 1  . lactulose (CHRONULAC) 10 GM/15ML solution Take 10 g by mouth daily as needed for moderate constipation.  (Patient not taking: Reported on 09/12/2019)    . lidocaine (LIDODERM) 5 % Place 1 patch onto the skin daily. Remove & Discard patch within 12 hours or as  directed by MD    . LORazepam (ATIVAN) 0.5 MG tablet TAKE 1 TABLET AT BEDTIME AS NEEDED FOR NAUSEA AND VOMITING. (Patient taking differently: Take 0.5 mg by mouth at bedtime as needed (nausea/vomiting). ) 20 tablet 0  .  melatonin 3 MG TABS tablet Take 1 tablet (3 mg total) by mouth at bedtime. 30 tablet 0  . metoCLOPramide (REGLAN) 5 MG tablet Take 2 tablets (10 mg total) by mouth every 8 (eight) hours as needed for nausea. 30 tablet 2  . omeprazole (PRILOSEC) 40 MG capsule Take 1 capsule (40 mg total) by mouth daily. 30 capsule 5  . sertraline (ZOLOFT) 50 MG tablet TAKE 1&1/2 TABLETS ONCE DAILY. 45 tablet 0  . traMADol (ULTRAM) 50 MG tablet TAKE ONE TABLET EVERY 6 HOURS AS NEEDED. (Patient taking differently: Take 50 mg by mouth every 6 (six) hours as needed for moderate pain or severe pain. ) 60 tablet 0  . vitamin B-12 (CYANOCOBALAMIN) 1000 MCG tablet Take 1 tablet by mouth daily.     No current facility-administered medications for this visit.   Facility-Administered Medications Ordered in Other Visits  Medication Dose Route Frequency Provider Last Rate Last Admin  . sodium chloride flush (NS) 0.9 % injection 10 mL  10 mL Intravenous PRN Shakayla Hickox, Virgie Dad, MD        OBJECTIVE:  There were no vitals filed for this visit.   There is no height or weight on file to calculate BMI.   There were no vitals filed for this visit.   ECOG FS:3 - Symptomatic, >50% confined to bed  Telemedicine visit 09/12/2019   LAB RESULTS:  CMP     Component Value Date/Time   NA 129 (L) 09/03/2019 0505   NA 140 02/11/2017 0755   K 3.8 09/03/2019 0505   K 4.4 02/11/2017 0755   CL 96 (L) 09/03/2019 0505   CO2 25 09/03/2019 0505   CO2 26 02/11/2017 0755   GLUCOSE 103 (H) 09/03/2019 0505   GLUCOSE 93 02/11/2017 0755   BUN 17 09/03/2019 0505   BUN 7.9 02/11/2017 0755   CREATININE 0.78 09/03/2019 0505   CREATININE 1.02 (H) 08/27/2019 1125   CREATININE 0.7 02/11/2017 0755   CALCIUM 8.1 (L) 09/03/2019  0505   CALCIUM 9.6 02/11/2017 0755   PROT 6.3 (L) 09/03/2019 0505   PROT 7.5 02/11/2017 0755   ALBUMIN 3.0 (L) 09/03/2019 0505   ALBUMIN 4.4 02/11/2017 0755   AST 12 (L) 09/03/2019 0505   AST 18 08/27/2019 1125   AST 27 02/11/2017 0755   ALT 10 09/03/2019 0505   ALT 14 08/27/2019 1125   ALT 24 02/11/2017 0755   ALKPHOS 87 09/03/2019 0505   ALKPHOS 58 02/11/2017 0755   BILITOT 0.4 09/03/2019 0505   BILITOT 0.6 08/27/2019 1125   BILITOT 0.43 02/11/2017 0755   GFRNONAA >60 09/03/2019 0505   GFRNONAA >60 08/27/2019 1125   GFRAA >60 09/03/2019 0505   GFRAA >60 08/27/2019 1125    INo results found for: SPEP, UPEP  Lab Results  Component Value Date   WBC 3.8 (L) 09/03/2019   NEUTROABS 3.2 08/30/2019   HGB 7.3 (L) 09/03/2019   HCT 22.1 (L) 09/03/2019   MCV 100.5 (H) 09/03/2019   PLT 165 09/03/2019      Chemistry      Component Value Date/Time   NA 129 (L) 09/03/2019 0505   NA 140 02/11/2017 0755   K 3.8 09/03/2019 0505   K 4.4 02/11/2017 0755   CL 96 (L) 09/03/2019 0505   CO2 25 09/03/2019 0505   CO2 26 02/11/2017 0755   BUN 17 09/03/2019 0505   BUN 7.9 02/11/2017 0755   CREATININE 0.78 09/03/2019 0505   CREATININE 1.02 (H) 08/27/2019 1125  CREATININE 0.7 02/11/2017 0755      Component Value Date/Time   CALCIUM 8.1 (L) 09/03/2019 0505   CALCIUM 9.6 02/11/2017 0755   ALKPHOS 87 09/03/2019 0505   ALKPHOS 58 02/11/2017 0755   AST 12 (L) 09/03/2019 0505   AST 18 08/27/2019 1125   AST 27 02/11/2017 0755   ALT 10 09/03/2019 0505   ALT 14 08/27/2019 1125   ALT 24 02/11/2017 0755   BILITOT 0.4 09/03/2019 0505   BILITOT 0.6 08/27/2019 1125   BILITOT 0.43 02/11/2017 0755      No results found for: LABCA2  No components found for: LABCA125  No results for input(s): INR in the last 168 hours.  Urinalysis    Component Value Date/Time   COLORURINE YELLOW 08/30/2019 2036   APPEARANCEUR HAZY (A) 08/30/2019 2036   LABSPEC 1.041 (H) 08/30/2019 2036   LABSPEC  1.005 10/09/2015 1239   PHURINE 6.0 08/30/2019 2036   GLUCOSEU NEGATIVE 08/30/2019 2036   GLUCOSEU Negative 10/09/2015 1239   HGBUR NEGATIVE 08/30/2019 2036   BILIRUBINUR NEGATIVE 08/30/2019 2036   BILIRUBINUR Negative 10/09/2015 1239   KETONESUR 20 (A) 08/30/2019 2036   PROTEINUR 30 (A) 08/30/2019 2036   UROBILINOGEN 0.2 10/09/2015 1239   NITRITE NEGATIVE 08/30/2019 2036   LEUKOCYTESUR SMALL (A) 08/30/2019 2036   LEUKOCYTESUR Negative 10/09/2015 1239    STUDIES: CT ABDOMEN PELVIS W CONTRAST  Result Date: 08/30/2019 CLINICAL DATA:  Abdominal pain, bilious vomiting, ovarian cancer EXAM: CT ABDOMEN AND PELVIS WITH CONTRAST TECHNIQUE: Multidetector CT imaging of the abdomen and pelvis was performed using the standard protocol following bolus administration of intravenous contrast. CONTRAST:  30m OMNIPAQUE IOHEXOL 300 MG/ML  SOLN COMPARISON:  07/30/2019 FINDINGS: Lower chest: Moderate left pleural effusion is again identified with compressive atelectasis of the left lung base. Visualized right lung base is clear. Central venous catheter tip noted within the superior cavoatrial junction. Cardiac size within normal limits. Hepatobiliary: Cholecystectomy has been performed. Stable hemangioma within the a inferior right hepatic lobe. The liver is otherwise unremarkable. No intra or extrahepatic biliary ductal dilation identified. Pancreas: Unremarkable Spleen: Unremarkable Adrenals/Urinary Tract: The adrenal glands are unremarkable. Mass effect upon the right kidney by the distended duodenum is again identified, however, the kidneys are otherwise unremarkable. There is normal symmetric renal cortical enhancement. The bladder appears circumferentially mildly thick walled and demonstrates moderate perivesicular inflammatory stranding, similar to prior examination, possibly reflecting a chronic inflammatory process such as a chronic infection or radiation cystitis in the appropriate clinical setting. The  bladder is not distended. Stomach/Bowel: There is massive distension of the a first and second portion of the duodenum to the level of the a point at which it crosses the midline. In this region, however, there is reasonable space between the abdominal aorta and superior mesenteric artery making SMA syndrome unlikely as a cause of obstruction. There is, however, numerous thick walled loops of small bowel again identified clustered within the mid abdomen. There is moderate ascites contain within thickened peritoneum, similar to that noted on prior examination. 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 No focal point of transition is identified, though the caliber of bowel does improve once the ileum exits the encapsulating peritoneum, best seen on axial image # 60/2. No free intraperitoneal gas. The large bowel is unremarkable. Vascular/Lymphatic: No pathologic adenopathy seen within the abdomen and pelvis. The inferior vena cava is compressed, progressive since prior examination, likely a combination of  the encapsulating process as well as some degree of hypovolemia. The arterial vasculature is unremarkable. Reproductive: Uterus absent.  No adnexal masses. Other: There is diffuse body wall wasting noted, progressive since prior examination. Musculoskeletal: The osseous structures are unremarkable. IMPRESSION: Diffuse bowel wall thickening and central clumping within encapsulating thickened peritoneum and moderate ascites. 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 This is most commonly seen in the setting of peritoneal dialysis or peritoneal carcinomatosis. Massive distention of the duodenum is progressive since prior examination. Stable chronic bladder wall thickening and perivesicular inflammatory stranding, possibly reflecting changes of a radiation cystitis in the  appropriate clinical setting. Progressive body wall wasting. Electronically Signed   By: Fidela Salisbury MD   On: 08/30/2019 20:35   DG Abd 2 Views  Result Date: 08/27/2019 CLINICAL DATA:  Obstruction. EXAM: ABDOMEN - 2 VIEW COMPARISON:  None. FINDINGS: The bowel gas pattern is normal. Status post cholecystectomy. There is no evidence of free air. No radio-opaque calculi or other significant radiographic abnormality is seen. IMPRESSION: No evidence of bowel obstruction or ileus. Mild to moderate size probably loculated left pleural effusion is noted. Electronically Signed   By: Marijo Conception M.D.   On: 08/27/2019 12:24     ELIGIBLE FOR AVAILABLE RESEARCH PROTOCOL:  no  ASSESSMENT: 54 y.o. BRCA negative [place] woman status post radical tumor debulking with optimal cytoreduction (R0) 03/27/2015 for a stage IIIC, high-grade right fallopian tube carcinoma  (a) baseline CA-125 was 1226.  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)  (c) tumor is PD-L1 negative (02/24/2016)  (d) tumor is strongly estrogen receptor positive, progesterone receptor negative (02/24/2016)  (1) adjuvant chemotherapy consisted of carboplatin and paclitaxel for 6 doses, begun 04/14/2015, completed 08/11/2015  (a) paclitaxel was omitted from cycle 5 and dose reduced on cycle 6 because of neuropathy   (b) a "make up" dose of paclitaxel was given 08/25/2015  (c) last carboplatin dose was 08/11/2015  (d) CA-125 normalized by 06/16/2015   (2) FIRST RECURRENCE: January 2018  (a) CA-125 rise beginning November 2017 led to CT scans and PET scans December 2017, all negative  (b) exploratory laparotomy 02/24/2016 showed pathologically confirmed recurrence, with miliary disease involving all examined surfaces  (c) port-site metastases noted 03/08/2016  (d)  the January 2018 tumor sample was tested for the estrogen receptor and he was 80 percent positive with strong staining, progesterone receptor negative  (3) carboplatin/liposomal doxorubicin started 03/05/2016, repeated every three weeks x4, completed 06/04/2016.  (a) cycle 3 delayed one week because of neutropenia; OnPro added  (b) CA 125 normalized after cycle 3  (4) RUCAPARIB maintenance started 07/02/2016  (a) restaging 10/01/2016: Normal CA 125, negative CT of the abdomen and pelvis  (b) labs 11/24/2016 shows a rise in her CA 125-43.4, with continuing rise thereafter  (c) rucaparib discontinued October 2018 with rising CA 125  (5) anastrozole started December 23, 2016-stopped after a couple of weeks with poor tolerance  SECOND RECURRENCE: (6) Gemcitabine/Bevacizumab started on 02/04/2017  (a) gemcitabine omitted cycle 4 because of intercurrent infection  (b) gemcitabine resumed with cycle 5, with intervening rise in  Ca 125  (c) repeat CT scan of the abdomen and pelvis on 05/26/2017 does not confirm obvious disease progression  (d) cycle 5 of gemcitabine/bevacizumab delayed 1 week   (e) gemcitabine/ bevacizumab discontinued after 6 cycles, with  rising CA 125 (last dose 06/24/2017)  (7) foundation one testing did not show a high mutation burden and the microsatellite status was stable.  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  These were not immediately actionable  (8) Abraxane started 07/12/2017, given weekly or weeks 1 and 2 of each 3 weeks cycle, last dose 11/15/2017 (5 cycles)  (a) cycle 2 started 08/09/2017, will be day 1 day 8 only  (b) cycle 3 scheduled to start 09/13/2017, day 1 day 8 at patient request  (c) CT of the abdomen and pelvis 11/28/2017 shows no measurable disease  (d) PET scan 01/25/2018 and MRI of the pelvis 02/03/2018 show no measurable disease  (e) patient has symptomatic ascites and a rapidly rising Ca1 25  (9) started cisplatin/gemcitabine days 1 and  8 of each 21-day cycle on 02/18/2018  (a) switched to carboplatin/ gemcitabine 07/17/2018 due to neuropathy  (b) carbo/gemzar changed to Q14 days 07/24/2018 due to neutropenia  (c) considered niraparib/Zejula 100 mg, 2 tabs daily at the completion of chemotherapy  (10) associated problems  (a) anemia due to chemotherapy: On Aranesp  (b) 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 10/25/18 Consenting for AMV-564 clinical trial 21. 11/13/18 AMV-564 initiation 75 mcg dose 22. 10/8 Dropped down to 50 mcg dose (I think that date is correct) 23. 10/19-21/20 Tx held due to Neutropenia 24. 12/06/18 Given Neulasta 25. 12/07/18 AMV-564 resumed 26. 01/03/19 CT C/A/P 10% RECIST progression 27. 01/15/19 Increased dose to 75 mcg/daily 28. 01/17/19 Thoracentesis with cytology c/w Muller[place] primary  29. Paracentesis 02/05/2019: cytology c/w adenocarcinoma  (12) schedule of thoracenteses:  (a) left 01/17/2019 [facility])  (b) left 02/06/2019 [facility])  (c) 05/21/2019 on the left [facility])  (13) schedule of paracenteses:  (a) 01/30/2019 ([place])  (b) 02/06/2019 ([place])  (c) 02/18/2018 ([place])  (d) 04/17/2019 ([place])  (e) additional procedures 04/30/2019, 05/24/2019  (14) abraxane started 02/20/2019, repeated days 1 and 8 of every 21 day cycle  (a) bevacizumab/Avastin started 02/20/2019 and repeated every 21 days  (b) She cannot tolerate Zarxio due to side effects consisting of low grade fever, body aches, malaise, fatigue, and itching, whereas she has been able to tolerate Granix though  (c) bevacizumab and Abraxane discontinued after 04/10/2019 dose with evidence of progression  (15) exemestane started 04/24/2019, discontinued after 2 weeks as patient opted for chemotherapy  (16) started liposomal doxorubicin/carboplatin 05/10/2019, repeated every 28 days  (a) echocardiogram  05/08/2017 shows an ejection fraction in the 55-60% range.  (b) patient is interested in [place] if available through the "right to try" program  (c) HER-2 determination requested on prior pathology 06/04/2019--negative results  (d) considering [facility] , [facility] or [place] study   (e) doxil/carbo discontinued after 08/02/2019 dose with evidence of progression   PLAN: Tyrihanna is stable at home.  She is expected to decline rapidly over the next week or 2.  So far there has not been any catastrophe and she is not in uncontrollable pain.  We discussed the fact that the fluid is not prolonging her life and improving her functional status but also causing her abdomen to become more tight.  The palliative care nurse is being very attentive and will call tomorrow.  I have also requested it be met from home health to make sure we are no overshooting or undershooting of the sodium.  We will have another virtual visit 09/18/2019.  They know to call for any other issues that may develop before then   Sarajane Jews C. Ladell Lea, MD 09/12/19 3:30 PM Medical Oncology and Hematology [facility] [place],  [address] Tel. [phone]    Fax. [phone]   I, Wilburn Mylar, am acting as scribe for Dr. Virgie Dad. Chelsee Hosie.  I, Lurline Del MD, have reviewed the above documentation for accuracy and completeness, and I agree with the above.   *Total Encounter Time as defined by the Centers for Medicare and Medicaid Services includes, in addition to the face-to-face time of a patient visit (documented in the note above) non-face-to-face time: obtaining and reviewing outside history, ordering and reviewing medications, tests or procedures, care coordination (communications with other health care professionals or caregivers) and documentation in the medical record.

## 2019-09-12 NOTE — Telephone Encounter (Signed)
See other entries

## 2019-09-14 ENCOUNTER — Observation Stay (HOSPITAL_COMMUNITY)
Admission: EM | Admit: 2019-09-14 | Discharge: 2019-09-15 | Disposition: A | Discharge status: Home / Self Care | Payer: 59 | Attending: Emergency Medicine | Admitting: Emergency Medicine

## 2019-09-14 ENCOUNTER — Encounter (HOSPITAL_COMMUNITY): Payer: Self-pay | Admitting: Emergency Medicine

## 2019-09-14 ENCOUNTER — Telehealth: Payer: Self-pay | Admitting: *Deleted

## 2019-09-14 ENCOUNTER — Other Ambulatory Visit: Payer: Self-pay

## 2019-09-14 DIAGNOSIS — C762 Malignant neoplasm of abdomen: Secondary | ICD-10-CM

## 2019-09-14 DIAGNOSIS — E871 Hypo-osmolality and hyponatremia: Secondary | ICD-10-CM | POA: Diagnosis present

## 2019-09-14 DIAGNOSIS — D6481 Anemia due to antineoplastic chemotherapy: Secondary | ICD-10-CM | POA: Diagnosis not present

## 2019-09-14 DIAGNOSIS — E878 Other disorders of electrolyte and fluid balance, not elsewhere classified: Secondary | ICD-10-CM | POA: Diagnosis not present

## 2019-09-14 DIAGNOSIS — R9431 Abnormal electrocardiogram [ECG] [EKG]: Secondary | ICD-10-CM

## 2019-09-14 DIAGNOSIS — Z8543 Personal history of malignant neoplasm of ovary: Secondary | ICD-10-CM | POA: Insufficient documentation

## 2019-09-14 DIAGNOSIS — I950 Idiopathic hypotension: Secondary | ICD-10-CM

## 2019-09-14 DIAGNOSIS — I959 Hypotension, unspecified: Secondary | ICD-10-CM | POA: Diagnosis present

## 2019-09-14 DIAGNOSIS — E43 Unspecified severe protein-calorie malnutrition: Secondary | ICD-10-CM | POA: Insufficient documentation

## 2019-09-14 DIAGNOSIS — Z95828 Presence of other vascular implants and grafts: Secondary | ICD-10-CM

## 2019-09-14 DIAGNOSIS — Z515 Encounter for palliative care: Secondary | ICD-10-CM

## 2019-09-14 DIAGNOSIS — Z85828 Personal history of other malignant neoplasm of skin: Secondary | ICD-10-CM | POA: Insufficient documentation

## 2019-09-14 DIAGNOSIS — E876 Hypokalemia: Secondary | ICD-10-CM | POA: Diagnosis not present

## 2019-09-14 DIAGNOSIS — C482 Malignant neoplasm of peritoneum, unspecified: Secondary | ICD-10-CM | POA: Insufficient documentation

## 2019-09-14 DIAGNOSIS — C5701 Malignant neoplasm of right fallopian tube: Secondary | ICD-10-CM

## 2019-09-14 DIAGNOSIS — R799 Abnormal finding of blood chemistry, unspecified: Secondary | ICD-10-CM | POA: Diagnosis present

## 2019-09-14 DIAGNOSIS — Z8544 Personal history of malignant neoplasm of other female genital organs: Secondary | ICD-10-CM | POA: Diagnosis not present

## 2019-09-14 DIAGNOSIS — J45909 Unspecified asthma, uncomplicated: Secondary | ICD-10-CM | POA: Diagnosis not present

## 2019-09-14 DIAGNOSIS — K5900 Constipation, unspecified: Secondary | ICD-10-CM | POA: Insufficient documentation

## 2019-09-14 DIAGNOSIS — C786 Secondary malignant neoplasm of retroperitoneum and peritoneum: Secondary | ICD-10-CM | POA: Diagnosis present

## 2019-09-14 DIAGNOSIS — C801 Malignant (primary) neoplasm, unspecified: Secondary | ICD-10-CM

## 2019-09-14 DIAGNOSIS — R1310 Dysphagia, unspecified: Secondary | ICD-10-CM

## 2019-09-14 DIAGNOSIS — Z79899 Other long term (current) drug therapy: Secondary | ICD-10-CM | POA: Insufficient documentation

## 2019-09-14 DIAGNOSIS — Z20822 Contact with and (suspected) exposure to covid-19: Secondary | ICD-10-CM | POA: Diagnosis not present

## 2019-09-14 DIAGNOSIS — N179 Acute kidney failure, unspecified: Principal | ICD-10-CM | POA: Insufficient documentation

## 2019-09-14 DIAGNOSIS — Z7189 Other specified counseling: Secondary | ICD-10-CM

## 2019-09-14 DIAGNOSIS — I4581 Long QT syndrome: Secondary | ICD-10-CM | POA: Insufficient documentation

## 2019-09-14 DIAGNOSIS — T451X5A Adverse effect of antineoplastic and immunosuppressive drugs, initial encounter: Secondary | ICD-10-CM | POA: Diagnosis present

## 2019-09-14 LAB — CBC WITH DIFFERENTIAL/PLATELET
Abs Immature Granulocytes: 0.02 10*3/uL (ref 0.00–0.07)
Basophils Absolute: 0 10*3/uL (ref 0.0–0.1)
Basophils Relative: 0 %
Eosinophils Absolute: 0 10*3/uL (ref 0.0–0.5)
Eosinophils Relative: 0 %
HCT: 32.2 % — ABNORMAL LOW (ref 36.0–46.0)
Hemoglobin: 11 g/dL — ABNORMAL LOW (ref 12.0–15.0)
Immature Granulocytes: 0 %
Lymphocytes Relative: 9 %
Lymphs Abs: 0.6 10*3/uL — ABNORMAL LOW (ref 0.7–4.0)
MCH: 32.9 pg (ref 26.0–34.0)
MCHC: 34.2 g/dL (ref 30.0–36.0)
MCV: 96.4 fL (ref 80.0–100.0)
Monocytes Absolute: 0.4 10*3/uL (ref 0.1–1.0)
Monocytes Relative: 6 %
Neutro Abs: 5.5 10*3/uL (ref 1.7–7.7)
Neutrophils Relative %: 85 %
Platelets: 210 10*3/uL (ref 150–400)
RBC: 3.34 MIL/uL — ABNORMAL LOW (ref 3.87–5.11)
RDW: 19.4 % — ABNORMAL HIGH (ref 11.5–15.5)
WBC: 6.5 10*3/uL (ref 4.0–10.5)
nRBC: 0 % (ref 0.0–0.2)

## 2019-09-14 LAB — BASIC METABOLIC PANEL
Anion gap: 20 — ABNORMAL HIGH (ref 5–15)
Anion gap: 18 — ABNORMAL HIGH (ref 5–15)
BUN: 49 mg/dL — ABNORMAL HIGH (ref 6–20)
BUN: 46 mg/dL — ABNORMAL HIGH (ref 6–20)
CO2: 44 mmol/L — ABNORMAL HIGH (ref 22–32)
CO2: 41 mmol/L — ABNORMAL HIGH (ref 22–32)
Calcium: 9.2 mg/dL (ref 8.9–10.3)
Calcium: 8.4 mg/dL — ABNORMAL LOW (ref 8.9–10.3)
Chloride: 66 mmol/L — ABNORMAL LOW (ref 98–111)
Chloride: 70 mmol/L — ABNORMAL LOW (ref 98–111)
Creatinine, Ser: 1.8 mg/dL — ABNORMAL HIGH (ref 0.44–1.00)
Creatinine, Ser: 1.85 mg/dL — ABNORMAL HIGH (ref 0.44–1.00)
GFR calc Af Amer: 36 mL/min — ABNORMAL LOW (ref >60)
GFR calc Af Amer: 35 mL/min — ABNORMAL LOW (ref >60)
GFR calc non Af Amer: 31 mL/min — ABNORMAL LOW (ref >60)
GFR calc non Af Amer: 30 mL/min — ABNORMAL LOW (ref >60)
Glucose, Bld: 108 mg/dL — ABNORMAL HIGH (ref 70–99)
Glucose, Bld: 101 mg/dL — ABNORMAL HIGH (ref 70–99)
Potassium: 2.1 mmol/L — CL (ref 3.5–5.1)
Potassium: 2.6 mmol/L — CL (ref 3.5–5.1)
Sodium: 130 mmol/L — ABNORMAL LOW (ref 135–145)
Sodium: 129 mmol/L — ABNORMAL LOW (ref 135–145)

## 2019-09-14 LAB — SARS CORONAVIRUS 2 BY RT PCR (HOSPITAL ORDER, PERFORMED IN ~~LOC~~ HOSPITAL LAB): SARS Coronavirus 2: NEGATIVE

## 2019-09-14 LAB — MAGNESIUM: Magnesium: 1.9 mg/dL (ref 1.7–2.4)

## 2019-09-14 MED ORDER — ENSURE ENLIVE PO LIQD
237.0000 mL | Freq: Three times a day (TID) | ORAL | Status: DC
  Filled 2019-09-14 (×4): qty 237

## 2019-09-14 MED ORDER — SODIUM CHLORIDE 0.9 % IV BOLUS: 500.0000 mL | Freq: Once | INTRAVENOUS | Status: DC

## 2019-09-14 MED ORDER — ACETAMINOPHEN 325 MG PO TABS: 650.0000 mg | ORAL_TABLET | ORAL | Status: DC | PRN

## 2019-09-14 MED ORDER — LACTULOSE 10 GM/15ML PO SOLN: 10.0000 g | Freq: Once | ORAL | Status: DC

## 2019-09-14 MED ORDER — DEXTROSE-NACL 5-0.45 % IV SOLN: INTRAVENOUS | Status: DC

## 2019-09-14 MED ORDER — MAGNESIUM SULFATE 50 % IJ SOLN: 2.0000 g | Freq: Once | INTRAMUSCULAR | Status: DC

## 2019-09-14 MED ORDER — LORAZEPAM 0.5 MG PO TABS
0.5000 mg | ORAL_TABLET | Freq: Four times a day (QID) | ORAL | Status: DC | PRN
  Administered 2019-09-15: 0.5 mg via ORAL
  Filled 2019-09-14: qty 1

## 2019-09-14 MED ORDER — POTASSIUM CHLORIDE CRYS ER 20 MEQ PO TBCR
40.0000 meq | EXTENDED_RELEASE_TABLET | Freq: Once | ORAL | Status: DC
  Filled 2019-09-14: qty 2

## 2019-09-14 MED ORDER — ALBUTEROL SULFATE HFA 108 (90 BASE) MCG/ACT IN AERS: 2.0000 | INHALATION_SPRAY | Freq: Four times a day (QID) | RESPIRATORY_TRACT | Status: DC | PRN

## 2019-09-14 MED ORDER — MAGNESIUM SULFATE 2 GM/50ML IV SOLN
2.0000 g | Freq: Once | INTRAVENOUS | Status: AC
  Administered 2019-09-14: 2 g via INTRAVENOUS
  Filled 2019-09-14: qty 50

## 2019-09-14 MED ORDER — SODIUM CHLORIDE 0.9 % IV SOLN: INTRAVENOUS | Status: DC

## 2019-09-14 MED ORDER — GABAPENTIN 100 MG PO CAPS
200.0000 mg | ORAL_CAPSULE | Freq: Every day | ORAL | Status: DC
  Administered 2019-09-14: 200 mg via ORAL
  Filled 2019-09-14: qty 2

## 2019-09-14 MED ORDER — POTASSIUM CHLORIDE 10 MEQ/100ML IV SOLN
10.0000 meq | INTRAVENOUS | Status: AC
  Administered 2019-09-14 (×3): 10 meq via INTRAVENOUS
  Filled 2019-09-14 (×3): qty 100

## 2019-09-14 MED ORDER — SODIUM CHLORIDE 0.9 % IV BOLUS
1000.0000 mL | Freq: Once | INTRAVENOUS | Status: AC
  Administered 2019-09-14: 1000 mL via INTRAVENOUS

## 2019-09-14 MED ORDER — ONDANSETRON HCL 4 MG PO TABS: 4.0000 mg | ORAL_TABLET | Freq: Four times a day (QID) | ORAL | Status: DC | PRN

## 2019-09-14 MED ORDER — ONDANSETRON HCL 4 MG/2ML IJ SOLN
4.0000 mg | Freq: Four times a day (QID) | INTRAMUSCULAR | Status: DC | PRN
  Administered 2019-09-14 – 2019-09-15 (×2): 4 mg via INTRAVENOUS
  Filled 2019-09-14 (×2): qty 2

## 2019-09-14 MED ORDER — HEPARIN SODIUM (PORCINE) 5000 UNIT/ML IJ SOLN
5000.0000 [IU] | Freq: Three times a day (TID) | INTRAMUSCULAR | Status: DC
  Filled 2019-09-14: qty 1

## 2019-09-14 MED ORDER — DICYCLOMINE HCL 10 MG PO CAPS
10.0000 mg | ORAL_CAPSULE | Freq: Four times a day (QID) | ORAL | Status: DC | PRN
  Administered 2019-09-15: 10 mg via ORAL
  Filled 2019-09-14: qty 1

## 2019-09-14 MED ORDER — LORAZEPAM 0.5 MG PO TABS: 0.5000 mg | ORAL_TABLET | Freq: Every evening | ORAL | Status: DC | PRN

## 2019-09-14 MED ORDER — PANTOPRAZOLE SODIUM 40 MG IV SOLR
40.0000 mg | INTRAVENOUS | Status: DC
  Administered 2019-09-14: 40 mg via INTRAVENOUS
  Filled 2019-09-14: qty 40

## 2019-09-14 MED ORDER — MELATONIN 3 MG PO TABS: 3.0000 mg | ORAL_TABLET | Freq: Every evening | ORAL | Status: DC | PRN

## 2019-09-14 MED ORDER — PANTOPRAZOLE SODIUM 40 MG PO TBEC: 40.0000 mg | DELAYED_RELEASE_TABLET | Freq: Every day | ORAL | Status: DC

## 2019-09-14 NOTE — ED Triage Notes (Signed)
Per [facility], patient with end stage ovarian cancer-low potassium, 2.5-patient is on palliative care, per oncology office, patient has a DNR-refusing hospice-here for IV potassium

## 2019-09-14 NOTE — ED Notes (Signed)
Pt not tolerating PO due to vomiting. Messaged Dr Tana Coast asking if can change route. Awaiting new orders.

## 2019-09-14 NOTE — H&P (Signed)
History and Physical        Hospital Admission Note Date: 09/14/2019  Patient name: Kathryn Ramsey Medical record number: [id number] Date of birth: 06/02/1965 Age: 54 y.o. Gender: female  PCP: Magrinat, Virgie Dad, MD    Patient coming from:   I have reviewed all records in the [place].    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  She has persistent nausea, vomiting, poor appetite, dysphagia.  Last bowel movement was more than a week ago.  Patient receives IV fluid hydration at home, followed by palliative care.  Per patient and husband at the bedside, they had not started hospice yet.  Patient reports mild abdominal pain and takes Tylenol with codeine.  States Zofran helps with the nausea. Per patient, they have tried NG tube during previous admission but was unsuccessful.  IR was unable to place a percutaneous J-tube J G-tube.  At the time of discharge, patient was able to tolerate more p.o. and full liquid diet Per patient, she is able to eat clear liquid or full liquid diet but has persistent nausea. Patient was admitted 7/15-7/20/2021 for similar presentation.  ED work-up/course:  In ED, sodium 130, potassium 2.1, chloride 66, bicarb 44, BUN 49, creatinine 1.8, magnesium 1.9.  Hemoglobin 11.0, platelets 210 Temp 98.1, respirate 19, pulse 79, BP 83/70, down to 76/65, received IV fluid bolus, improved to 97/75, alert and oriented.  Review of Systems: Positives marked in 'bold' Constitutional: Denies fever, chills, diaphoresis, poor appetite and fatigue.  HEENT: Denies photophobia, eye pain, redness, hearing loss, ear  pain, congestion, sore throat, rhinorrhea, sneezing, mouth sores, trouble swallowing, neck pain, neck stiffness and tinnitus.   Respiratory: Denies SOB, DOE, cough, chest tightness,  and wheezing.   Cardiovascular: Denies chest pain, palpitations and leg swelling.  Gastrointestinal: Please see HPI.  Genitourinary: Denies dysuria, urgency, frequency, hematuria, flank pain and difficulty urinating.  Musculoskeletal: Denies myalgias, back pain, joint swelling, arthralgias and gait problem.  Skin: Denies pallor, rash and wound.  Neurological: Generalized weakness Hematological: Denies adenopathy. Easy bruising, personal or family bleeding history  Psychiatric/Behavioral: Denies suicidal ideation, mood changes, confusion, nervousness, sleep disturbance and agitation  Past Medical History: Past Medical History:  Diagnosis Date  . Acute sensory neuropathy 06/26/2015  . Allergy   . Anemia   . Asthma    illness induced asthma  . Blood transfusion without reported diagnosis    at age 72 years old D/T surgery to femur being crushed  . 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 Heart murmur    pt, states had a "working heart murmur"  . History of kidney stones   . Neutropenia, drug-induced ([place]) 03/10/2018  . PONV (postoperative nausea and vomiting)   . Skin cancer     Past Surgical History:  Procedure Laterality Date  . 3 laporscopic proceedures    . ABDOMINAL HYSTERECTOMY  2010  . CHOLECYSTECTOMY     2010  . DILATION AND CURETTAGE OF UTERUS     1997  . IR IMAGING GUIDED PORT INSERTION  03/08/2018  . LAPAROSCOPY N/A 03/27/2015   Procedure: DIAGNOSTIC LAPAROSCOPY ;  Surgeon: [name], MD;  Location: [facility];  Service: Gynecology;  Laterality: N/A;  . LAPAROSCOPY N/A 02/24/2016   Procedure: LAPAROSCOPY DIAGNOSTIC WITH PERITONEAL WASHINGS AND PERITONEAL BIOPSY;  Surgeon: [name], MD;  Location: [facility];  Service:  Gynecology;  Laterality: N/A;  . LAPAROTOMY N/A 03/27/2015   Procedure: EXPLORATORY LAPAROTOMY, BILATERAL SALPINGO OOPHORECTOMY, OMENTECTOMY, RADICAL TUMOR DEBULKING;  Surgeon: [name], MD;  Location: [facility];  Service: Gynecology;  Laterality: N/A;  . sinus surgery 1994      Medications: Prior to Admission medications   Medication Sig Start Date End Date Taking? Authorizing Provider  acetaminophen (TYLENOL) 500 MG tablet Take 2 tablets by mouth 3 (three) times daily as needed for moderate pain.  11/16/18  Yes [provider]  Acetaminophen-Codeine (TYLENOL/CODEINE #3) 300-30 MG tablet Take 1 tablet by mouth every 4 (four) hours as needed for pain.   Yes [provider]  albuterol (VENTOLIN HFA) 108 (90 Base) MCG/ACT inhaler Inhale 2 puffs into the lungs every 6 (six) hours as needed for wheezing or shortness of breath.  01/19/19 01/19/20 Yes [provider]  dicyclomine (BENTYL) 10 MG capsule TAKE 1 CAPSULE 4 TIMES DAILY BEFORE MEALS AND AT BEDTIME. Patient taking differently: Take 10 mg by mouth 4 (four) times daily as needed for spasms.  07/20/19  Yes Causey, Charlestine Massed, NP  gabapentin (NEURONTIN) 100 MG capsule 200 mg bid with 300 mg qhs Patient taking differently: Take 200 mg by mouth at bedtime.  03/22/19  Yes Magrinat, Virgie Dad, MD  lidocaine (LIDODERM) 5 % Place 1 patch onto the skin daily. Remove & Discard patch within 12 hours or as directed by MD   Yes [provider]  LORazepam (ATIVAN) 0.5 MG tablet TAKE 1 TABLET AT BEDTIME AS NEEDED FOR NAUSEA AND VOMITING. Patient taking differently: Take 0.5 mg by mouth at bedtime as needed (nausea/vomiting).  08/17/19  Yes Causey, Charlestine Massed, NP  melatonin 3 MG TABS tablet Take 1 tablet (3 mg total) by mouth at bedtime. Patient taking differently: Take 3 mg by mouth at bedtime as needed (sleep aid).  09/04/19  Yes Little Ishikawa, MD  omeprazole (PRILOSEC) 40 MG capsule Take 1 capsule (40 mg total) by  mouth daily. 05/22/18  Yes Magrinat, Virgie Dad, MD  ondansetron (ZOFRAN-ODT) 8 MG disintegrating tablet Take 8 mg by mouth every 6 (six) hours. 09/10/19  Yes [provider]  sertraline (ZOLOFT) 50 MG tablet TAKE 1&1/2 TABLETS ONCE DAILY. Patient taking differently: Take 75 mg by mouth daily.  09/10/19  Yes Magrinat, Virgie Dad, MD  traMADol (ULTRAM) 50 MG tablet TAKE ONE TABLET EVERY 6 HOURS AS NEEDED. Patient taking differently: Take 50 mg by mouth every 6 (six) hours as needed for moderate pain or severe pain.  07/20/19  Yes Magrinat, Virgie Dad, MD  diphenhydrAMINE (BENADRYL) 50 MG capsule 50 mg Diphenhydramine (Benadryl) PO within 1 hour of the injection 11/28/17 11/28/17  Causey, Charlestine Massed, NP  feeding supplement, ENSURE ENLIVE, (ENSURE ENLIVE) LIQD Take 237 mLs by mouth 3 (three) times daily after meals. Patient not taking: Reported on 09/12/2019 09/04/19   Little Ishikawa, MD  fentaNYL (DURAGESIC) 12 MCG/HR Place 1 patch onto the skin every 3 (three) days. Patient not taking:  Reported on 09/12/2019 09/07/19   Magrinat, Virgie Dad, MD  lactulose (CHRONULAC) 10 GM/15ML solution Take 10 g by mouth daily as needed for moderate constipation.  Patient not taking: Reported on 09/12/2019 08/17/19   [provider]  metoCLOPramide (REGLAN) 5 MG tablet Take 2 tablets (10 mg total) by mouth every 8 (eight) hours as needed for nausea. Patient not taking: Reported on 09/14/2019 05/15/19   Magrinat, Virgie Dad, MD    Allergies:   Allergies  Allergen Reactions  . Bee Venom Shortness Of Breath    swelling  . Prochlorperazine Edisylate Anaphylaxis, Anxiety and Palpitations    Anaphylaxis, Anxiety and Palpitations   . Sulfa Antibiotics Shortness Of Breath    Vomit , diarrhea , hives  . Phenergan [Promethazine Hcl] Other (See Comments)    "funny sensation in my throat"  . Zarxio [Filgrastim]     Social History:  reports that she has never smoked. She has never used smokeless tobacco. She  reports current alcohol use. She reports that she does not use drugs.  Family History: Family History  Problem Relation Age of Onset  . Hypertension Father   . Skin cancer Father        nonmelanoma skin cancers in his late 57s  . Non-Hodgkin's lymphoma Maternal Aunt        dx. 68s; smoker  . Stroke Maternal Grandmother   . Other Mother        benign meningioma dx. early-mid-70s; hysterectomy in her late 56s for heavy periods - still has ovaries  . Other Son        one son with pre-cancerous skin findings  . Other Daughter        cysts on ovaries and hx of heavy periods  . Other Sister        hysterectomy for cysts - still has ovaries  . Heart attack Maternal Grandfather   . Heart attack Paternal Grandfather   . Renal cancer Maternal Aunt 60       smoker  . Breast cancer Maternal Aunt 78  . Other Maternal Aunt        dx. benign brain tumor (meningioma) at 73; 2nd benign brain tumor in her late 109s; hx of radical hysterectomy at age 47  . Brain cancer Other        NOS tumor    Physical Exam: Blood pressure (!) 83/66, pulse 77, temperature 98.1 F (36.7 C), temperature source Oral, resp. rate 14, SpO2 98 %. General: Alert, awake, oriented x3, ill-appearing Eyes: pink conjunctiva,anicteric sclera, pupils equal and reactive to light and accomodation, HEENT: normocephalic, atraumatic, oropharynx clear Neck: supple, no masses or lymphadenopathy, no goiter, no bruits, no JVD CVS: Regular rate and rhythm, without murmurs, rubs or gallops. No lower extremity edema Resp : Clear to auscultation bilaterally, no wheezing, rales or rhonchi.  Right port in place GI : Soft, nontender, distended, positive bowel sounds, no masses. No hepatomegaly. No hernia.  Musculoskeletal: No clubbing or cyanosis, positive pedal pulses. No contracture. ROM intact  Neuro: Grossly intact, no focal neurological deficits, strength 5/5 upper and lower extremities bilaterally Psych: alert and oriented x 3, normal  mood and affect Skin: no rashes or lesions, warm and dry   LABS on Admission: I have personally reviewed all the labs and imagings below    Basic Metabolic Panel: Recent Labs  Lab 09/14/19 1256  NA 130*  K 2.1*  CL 66*  CO2 44*  GLUCOSE 108*  BUN 49*  CREATININE 1.80*  CALCIUM 9.2  MG 1.9   Liver Function Tests: No results for input(s): AST, ALT, ALKPHOS, BILITOT, PROT, ALBUMIN in the last 168 hours. No results for input(s): LIPASE, AMYLASE in the last 168 hours. No results for input(s): AMMONIA in the last 168 hours. CBC: Recent Labs  Lab 09/14/19 1256  WBC 6.5  NEUTROABS 5.5  HGB 11.0*  HCT 32.2*  MCV 96.4  PLT 210   Cardiac Enzymes: No results for input(s): CKTOTAL, CKMB, CKMBINDEX, TROPONINI in the last 168 hours. BNP: Invalid input(s): POCBNP CBG: No results for input(s): GLUCAP in the last 168 hours.  Radiological Exams on Admission:  No results found.    EKG: Independently reviewed.  Prolonged QTC 583   Assessment/Plan Principal Problem: Acute kidney injury -Likely due to profound dehydration, contraction alkalosis, hyponatremia, hypokalemia hypochloremia, -Due to hypotension, with SBP in 70s, gave IV normal saline fluid bolus, followed by sodium chloride infusion at 125 cc an hour - will continue clear liquid diet and advance to full liquids as tolerated, continue nutritional supplements   Active Problems: Hypotension -Per patient she has baseline hypotension, continue IV fluid hydration  Severe hypokalemia, hypochloremia, hyponatremia -Potassium 2.1, magnesium 1.9, sodium 130 -Placed on aggressive potassium replacement, will recheck serial BMET -Continue IV fluid hydration with saline at 125 cc an hour after bolus  History of peritoneal carcinomatosis, high-grade right fallopian tube carcinoma, Port-A-Cath in place -Followed by Dr. Jana Hakim outpatient, currently under palliative care.  Per patient and husband at the bedside, has not  started hospice yet -Will request palliative care consult for goals of care, patient has been on IV fluid hydration at home, has persistent nausea, vomiting, failure to thrive with poor functional status, constipation -Per patient, NG tube and feeding tube had been attempted however she is not a candidate for PEG tube  Prolonged QTC -QTc 583, hold off on QT prolonging medications -Unfortunately allergic to Phenergan, Compazine.  -will place on Ativan as needed for nausea and vomiting, Zofran cautiously for refractory nausea and vomiting.  Follow QTC closely  Neuropathy due to chemotherapy -Continue Neurontin  Constipation -Continue lactulose  Severe protein calorie malnutrition -Prealbumin 12.4 -Placed on nutritional supplements  DVT prophylaxis: Heparin subcu  CODE STATUS: Discussed in detail with the patient, she opted to be DNR/DNI status  Consults called: Palliative  Family Communication: Admission, patients condition and plan of care including tests being ordered have been discussed with the patient and husband who indicates understanding and agree with the plan and Code Status  Admission status: Observation stepdown  The medical decision making on this patient was of high complexity and the patient is at high risk for clinical deterioration, therefore this is a level 3 admission.  Severity of Illness:      The appropriate patient status for this patient is OBSERVATION. Observation status is judged to be reasonable and necessary in order to provide the required intensity of service to ensure the patient's safety. The patient's presenting symptoms, physical exam findings, and initial radiographic and laboratory data in the context of their medical condition is felt to place them at decreased risk for further clinical deterioration. Furthermore, it is anticipated that the patient will be medically stable for discharge from the hospital within 2 midnights of admission. The following  factors support the patient status of observation.   " The patient's presenting symptoms include profound dehydration, persistent nausea and vomiting, constipation " The physical exam findings include profound dehydration, dry mucosal membranes, hypotension " The initial radiographic and laboratory  data are severe hypokalemia, acute kidney injury, hyponatremia,     Time Spent on Admission: 70 minutes     Arnitra Sokoloski M.D. [facility] 09/14/2019, 3:44 PM

## 2019-09-14 NOTE — ED Notes (Signed)
Per Dr Magrenot's office-patient is a cancer and palliative care patient-patient has a low K+, 2.5-needs IV potassium-patient is a DNR

## 2019-09-14 NOTE — ED Notes (Signed)
Date and time results received: 09/14/19 7:24 PM  (use smartphrase ".now" to insert current time)  Test: Potassium Critical Value: 2.6  Name of Provider Notified: [place]  Orders Received? Or Actions Taken?: Orders Received - See Orders for details

## 2019-09-14 NOTE — ED Provider Notes (Signed)
[name] Note   CSN: [id number] Arrival date & time: 09/14/19  1220     History Chief Complaint  Patient presents with  . Abnormal Lab    Kathryn Ramsey is a 54 y.o. female with past medical history of metastatic ovarian cancer on palliative care, presenting from home after she was called with low potassium level after outpatient check yesterday.  Patient is currently on a clear liquid diet at home after recent admission earlier this month for small bowel obstruction.  She also receives infusions of D5 half-normal saline and D5 normal saline daily.  She was told her potassium level is 2 from labs drawm yesterday and sent to the ED for urgent potassium replacement.  She denies any acute changes in her wellbeing.  She has no current complaints at this time.  The history is provided by the patient and medical records.       Past Medical History:  Diagnosis Date  . Acute sensory neuropathy 06/26/2015  . Allergy   . Anemia   . Asthma    illness induced asthma  . Blood transfusion without reported diagnosis    at age 40 years old D/T surgery to femur being crushed  . 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 Heart murmur    pt, states had a "working heart murmur"  . History of kidney stones   . Neutropenia, drug-induced ([place]) 03/10/2018  . PONV (postoperative nausea and vomiting)   . Skin cancer     Patient Active Problem List   Diagnosis Date Noted  . Nausea & vomiting 08/30/2019  . Dysphagia 05/25/2019  . 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 Anemia due to antineoplastic chemotherapy 05/22/2018  . Port-A-Cath in place 05/01/2018  . Neutropenia, drug-induced ([place]) 03/10/2018  . Diarrhea 08/02/2017  . Lichen sclerosus et atrophicus of the vulva 03/31/2016  . Abdominal wall mass of right lower quadrant 03/08/2016  . 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 Goals of care, counseling/discussion 02/27/2016  . Elevated CA-125 02/24/2016  . Vaginal dryness, menopausal 10/01/2015  . Drug-induced peripheral neuropathy ([place]) 09/02/2015  . Leukopenia due to antineoplastic chemotherapy ([place]) 09/02/2015  . Chemotherapy-induced neuropathy ([place]) 08/19/2015  . Cellulitis 07/03/2015  . Acute sensory neuropathy 06/26/2015  . High risk medication use 06/21/2015  . Facial paresthesia 06/18/2015  . Genetic testing 06/16/2015  . Restless legs 05/14/2015  . Family history of breast cancer in female 04/29/2015  . Chemotherapy-induced nausea 04/22/2015  . Chemotherapy induced neutropenia ([place]) 04/22/2015  . 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 History of hysterectomy for benign disease 03/21/2015  . IBS (irritable bowel syndrome) 03/21/2015  . Dense breast tissue 03/21/2015  . History of cholecystectomy 03/21/2015  . Abdominal carcinomatosis ([place]) 03/21/2015    Past Surgical History:  Procedure Laterality Date  . 3 laporscopic proceedures    . ABDOMINAL HYSTERECTOMY     2010  . CHOLECYSTECTOMY     2010  . DILATION AND CURETTAGE OF UTERUS     1997  . IR IMAGING GUIDED PORT INSERTION  03/08/2018  . LAPAROSCOPY N/A 03/27/2015   Procedure: DIAGNOSTIC LAPAROSCOPY ;  Surgeon: [name], MD;  Location: [facility];  Service: Gynecology;  Laterality: N/A;  . LAPAROSCOPY N/A 02/24/2016   Procedure: LAPAROSCOPY DIAGNOSTIC WITH PERITONEAL WASHINGS AND PERITONEAL BIOPSY;  Surgeon: [name], MD;  Location: [facility];  Service: Gynecology;  Laterality: N/A;  . LAPAROTOMY N/A 03/27/2015   Procedure: EXPLORATORY LAPAROTOMY, BILATERAL SALPINGO OOPHORECTOMY, OMENTECTOMY, RADICAL TUMOR DEBULKING;  Surgeon: [name], MD;  Location: [facility];  Service: Gynecology;  Laterality: N/A;  . sinus surgery 1994       OB History   No obstetric history on file.     Family History  Problem Relation Age of Onset  . Hypertension Father   .  Skin cancer Father        nonmelanoma skin cancers in his late 56s  . Non-Hodgkin's lymphoma Maternal Aunt        dx. 38s; smoker  . Stroke Maternal Grandmother   . Other Mother        benign meningioma dx. early-mid-70s; hysterectomy in her late 107s for heavy periods - still has ovaries  . Other Son        one son with pre-cancerous skin findings  . Other Daughter        cysts on ovaries and hx of heavy periods  . Other Sister        hysterectomy for cysts - still has ovaries  . Heart attack Maternal Grandfather   . Heart attack Paternal Grandfather   . Renal cancer Maternal Aunt 60       smoker  . Breast cancer Maternal Aunt 78  . Other Maternal Aunt        dx. benign brain tumor (meningioma) at 9; 2nd benign brain tumor in her late 73s; hx of radical hysterectomy at age 56  . Brain cancer Other        NOS tumor    Social History   Tobacco Use  . Smoking status: Never Smoker  . Smokeless tobacco: Never Used  Vaping Use  . Vaping Use: Never used  Substance Use Topics  . Alcohol use: Yes    Comment:  3 glasses wine or beer/week  . Drug use: No    Home Medications Prior to Admission medications   Medication Sig Start Date End Date Taking? Authorizing Provider  acetaminophen (TYLENOL) 500 MG tablet Take 2 tablets by mouth 3 (three) times daily as needed for moderate pain.  11/16/18  Yes [provider]  Acetaminophen-Codeine (TYLENOL/CODEINE #3) 300-30 MG tablet Take 1 tablet by mouth every 4 (four) hours as needed for pain.   Yes [provider]  albuterol (VENTOLIN HFA) 108 (90 Base) MCG/ACT inhaler Inhale 2 puffs into the lungs every 6 (six) hours as needed for wheezing or shortness of breath.  01/19/19 01/19/20 Yes [provider]  dicyclomine (BENTYL) 10 MG capsule TAKE 1 CAPSULE 4 TIMES DAILY BEFORE MEALS AND AT BEDTIME. Patient taking differently: Take 10 mg by mouth 4 (four) times daily as needed for spasms.  07/20/19  Yes Causey, Charlestine Massed, NP  gabapentin (NEURONTIN) 100 MG capsule 200 mg bid with 300 mg qhs Patient taking differently: Take 200 mg by mouth at bedtime.  03/22/19  Yes Magrinat, Virgie Dad, MD  lidocaine (LIDODERM) 5 % Place 1 patch onto the skin daily. Remove & Discard patch within 12 hours or as directed by MD   Yes [provider]  LORazepam (ATIVAN) 0.5 MG tablet TAKE 1 TABLET AT BEDTIME AS NEEDED FOR NAUSEA AND VOMITING. Patient taking differently: Take 0.5 mg by mouth at bedtime as needed (nausea/vomiting).  08/17/19  Yes Causey, Charlestine Massed, NP  melatonin 3 MG TABS tablet Take 1 tablet (3 mg total) by mouth at bedtime. Patient taking  differently: Take 3 mg by mouth at bedtime as needed (sleep aid).  09/04/19  Yes Little Ishikawa, MD  omeprazole (PRILOSEC) 40 MG capsule Take 1 capsule (40 mg total) by mouth daily. 05/22/18  Yes Magrinat, Virgie Dad, MD  ondansetron (ZOFRAN-ODT) 8 MG disintegrating tablet Take 8 mg by mouth every 6 (six) hours. 09/10/19  Yes [provider]  sertraline (ZOLOFT) 50 MG tablet TAKE 1&1/2 TABLETS ONCE DAILY. Patient taking differently: Take 75 mg by mouth daily.  09/10/19  Yes Magrinat, Virgie Dad, MD  traMADol (ULTRAM) 50 MG tablet TAKE ONE TABLET EVERY 6 HOURS AS NEEDED. Patient taking differently: Take 50 mg by mouth every 6 (six) hours as needed for moderate pain or severe pain.  07/20/19  Yes Magrinat, Virgie Dad, MD  diphenhydrAMINE (BENADRYL) 50 MG capsule 50 mg Diphenhydramine (Benadryl) PO within 1 hour of the injection 11/28/17 11/28/17  Causey, Charlestine Massed, NP  feeding supplement, ENSURE ENLIVE, (ENSURE ENLIVE) LIQD Take 237 mLs by mouth 3 (three) times daily after meals. Patient not taking: Reported on 09/12/2019 09/04/19   Little Ishikawa, MD  fentaNYL (DURAGESIC) 12 MCG/HR Place 1 patch onto the skin every 3 (three) days. Patient not taking: Reported on 09/12/2019 09/07/19   Magrinat, Virgie Dad, MD  lactulose [facility]) 10 GM/15ML solution Take 10  g by mouth daily as needed for moderate constipation.  Patient not taking: Reported on 09/12/2019 08/17/19   [provider]  metoCLOPramide (REGLAN) 5 MG tablet Take 2 tablets (10 mg total) by mouth every 8 (eight) hours as needed for nausea. Patient not taking: Reported on 09/14/2019 05/15/19   Magrinat, Virgie Dad, MD    Allergies    Bee venom, Prochlorperazine edisylate, Sulfa antibiotics, Phenergan [promethazine hcl], and Zarxio [filgrastim]  Review of Systems   Review of Systems  All other systems reviewed and are negative.   Physical Exam Updated Vital Signs BP (!) 83/70 (BP Location: Left Arm)   Pulse 79   Temp 98.1 F (36.7 C) (Oral)   Resp 19   SpO2 97%   Physical Exam Vitals and nursing note reviewed.  Constitutional:      Appearance: She is well-developed.     Comments: Thin, chronically ill-appearing  HENT:     Head: Normocephalic and atraumatic.  Eyes:     Conjunctiva/sclera: Conjunctivae normal.  Cardiovascular:     Rate and Rhythm: Normal rate and regular rhythm.     Comments: Right port in place Pulmonary:     Effort: Pulmonary effort is normal. No respiratory distress.     Breath sounds: Normal breath sounds.  Abdominal:     Palpations: Abdomen is soft.  Skin:    General: Skin is warm.  Neurological:     Mental Status: She is alert.  Psychiatric:        Behavior: Behavior normal.     ED Results / Procedures / Treatments   Labs (all labs ordered are listed, but only abnormal results are displayed) Labs Reviewed  BASIC METABOLIC PANEL - Abnormal; Notable for the following components:      Result Value   Sodium 130 (*)    Potassium 2.1 (*)    Chloride 66 (*)    CO2 44 (*)    Glucose, Bld 108 (*)    BUN 49 (*)    Creatinine, Ser 1.80 (*)    GFR calc non Af Amer 31 (*)    GFR calc Af Amer 36 (*)    Anion gap 20 (*)  All other components within normal limits  CBC WITH DIFFERENTIAL/PLATELET - Abnormal; Notable for the following  components:   RBC 3.34 (*)    Hemoglobin 11.0 (*)    HCT 32.2 (*)    RDW 19.4 (*)    Lymphs Abs 0.6 (*)    All other components within normal limits  SARS CORONAVIRUS 2 BY RT PCR [facility] ORDER, [place] [employer] [facility])  MAGNESIUM    EKG EKG Interpretation  Date/Time:  Friday September 14 2019 12:35:20 EDT Ventricular Rate:  76 PR Interval:    QRS Duration: 87 QT Interval:  518 QTC Calculation: 583 R Axis:   79 Text Interpretation: Sinus rhythm Borderline short PR interval Minimal ST depression, diffuse leads Prolonged QT interval prolonged QT is new Confirmed by Fredia Sorrow [phone]) on 09/14/2019 1:23:31 PM   Radiology No results found.  Procedures .Critical Care Performed by: Taraya Steward, [place] N, PA-C Authorized by: Bryson Palen, [place] N, PA-C   Critical care provider statement:    Critical care time (minutes):  45   Critical care time was exclusive of:  Separately billable procedures and treating other patients and teaching time   Critical care was necessary to treat or prevent imminent or life-threatening deterioration of the following conditions:  Metabolic crisis and cardiac failure   Critical care was time spent personally by me on the following activities:  Discussions with consultants, evaluation of patient's response to treatment, examination of patient, ordering and performing treatments and interventions, ordering and review of laboratory studies, ordering and review of radiographic studies, pulse oximetry, re-evaluation of patient's condition, obtaining history from patient or surrogate and review of old charts   I assumed direction of critical care for this patient from another provider in my specialty: no     (including critical care time)  Medications Ordered in ED Medications  potassium chloride 10 mEq in 100 mL IVPB (10 mEq Intravenous New Bag/Given 09/14/19 1432)  potassium chloride SA (KLOR-CON) CR tablet 40 mEq (0 mEq Oral Hold 09/14/19 1422)    dextrose 5 %-0.45 % sodium chloride infusion ( Intravenous New Bag/Given 09/14/19 1431)  magnesium sulfate IVPB 2 g 50 mL (has no administration in time range)    ED Course  I have reviewed the triage vital signs and the nursing notes.  Pertinent labs & imaging results that were available during my care of the patient were reviewed by me and considered in my medical decision making (see chart for details).    MDM Rules/Calculators/A&P                          Patient comes from home on palliative care for metastatic ovarian cancer, per stenting for hypokalemia.  Potassium is critically low at 2.1 here today, EKG with new prolonged QT interval QTC of 583.  Mg is low normal at 1.9, will also replace. Cr elevated from baseline at 1.80. Her BP is noted to be low, though she reports her baseline is around 90/60. She is given D5/saline infusions daily at home, will order here as well.  Patient however has no other new complaints or acute changes in her overall wellbeing.   Patient will prefer to be discharged home, however given severity of hypokalemia we are unable to replace adequately in the ED.  Patient verbalizes understanding of this and is agreeable to admission for potassium replacement. Patient will be admitted for potassium replacement.   Final Clinical Impression(s) / ED Diagnoses Final diagnoses:  Hypokalemia  Prolonged Q-T interval on ECG  Palliative care patient    Rx / DC Orders ED Discharge Orders    None       Eldra Word, [place] N, PA-C 09/14/19 1439    Fredia Sorrow, MD 09/14/19 Junious Dresser    Fredia Sorrow, MD 09/14/19 [phone]

## 2019-09-14 NOTE — ED Triage Notes (Signed)
Pt brought in by [facility] from home for low Potassium of 2. Pt has cancer. Pt reports had lots of nausea and vomiting for months, pt reports has a bowel blockage.

## 2019-09-14 NOTE — ED Notes (Signed)
ED Provider at bedside. 

## 2019-09-14 NOTE — Telephone Encounter (Addendum)
This RN retrieved VM x 2 - 1st one left at 805 am from [facility] stating they have a panic lab value and have been unsuccessful in speaking to anyone directly.  They are reporting a panic potassium of 2.0 per draw yesterday.  This RN called and spoke with [facility] at [phone] per above- and requested fax of labs.  Note home health does not do potassium runs due to concern with monitoring.  This RN called and discussed above with the patient including current level of potassium could be life ending if not replaced.  This RN discussed options of not doing anything or getting IV replacement- IV replacement would need to be done in appropriate setting due to need of monitoring- which in this case would be the ER.  Kathryn Ramsey states she would like for this issue not to take her life - she is amenable to receiving IV replacement " but I do not want to be admitted ".  This RN explained goal of care for replacement on ly will be discussed with the ER.  This RN called and discussed above with the patient's husband , Kathryn Ramsey, who verbalized understanding- he is presently not in the home but is in route now to return.  This RN spoke with the ER Charge nurse- and explained need and goal of care.  This RN also spoke with Kathryn Ramsey- palliative RN- who is following patient and family - note Kathryn Ramsey is on call this weekend as well.  This RN then returned call to Kathryn Ramsey- informed him to proceed to the ER with Kathryn Ramsey- with current ER status as having space at this time.  Kathryn Ramsey is calling EMS to take the patient for best outcome.  No further needs by this office at this time.  This note will be sent to On Call MD for review of communication.

## 2019-09-14 NOTE — ED Provider Notes (Signed)
Medical screening examination/treatment/procedure(s) were conducted as a shared visit with non-physician practitioner(s) and myself.  I personally evaluated the patient during the encounter.  EKG Interpretation  Date/Time:  Friday September 14 2019 12:35:20 EDT Ventricular Rate:  76 PR Interval:    QRS Duration: 87 QT Interval:  518 QTC Calculation: 583 R Axis:   79 Text Interpretation: Sinus rhythm Borderline short PR interval Minimal ST depression, diffuse leads Prolonged QT interval prolonged QT is new Confirmed by Fredia Sorrow [phone]) on 09/14/2019 1:23:31 PM   Patient seen by me along with physician assistant.  Patient has a history of of metastatic fallopian cancer.  Patient is a DNR.  Patient under palliative care.  Patient was brought in by [facility] for home for low potassium of 2.  Patient reports lots of nausea and vomiting for months.  Patient able to take p.o. however.  Potassium here was 2.1 making it a critical value.  Patient will receive 2 rounds of IV potassium with 10 mEq each will receive 40 mill equivalents p.o.  Patient agrees to admission for replenishment of the potassium.  Patient with cardiac monitoring.  Patient is very thin and cachectic.  Blood pressures anywhere from upper 50I to 96 systolic.  States her pressures usually are low in the 90s.  Not concerned about any significant hypotension at this time.  Patient normally is on IV fluids at home.  We will start D5 half normal saline at 125 cc an hour.  She normally receives that for 5 hours each day.  And then a switch to D5 normal saline at 125 cc an hour for another 5 hours.  Patient is in agreement with the admission for replenishment of the potassium.   CRITICAL CARE Performed by: Fredia Sorrow Total critical care time: 35 minutes Critical care time was exclusive of separately billable procedures and treating other patients. Critical care was necessary to treat or prevent imminent or life-threatening  deterioration. Critical care was time spent personally by me on the following activities: development of treatment plan with patient and/or surrogate as well as nursing, discussions with consultants, evaluation of patient's response to treatment, examination of patient, obtaining history from patient or surrogate, ordering and performing treatments and interventions, ordering and review of laboratory studies, ordering and review of radiographic studies, pulse oximetry and re-evaluation of patient's condition.    Fredia Sorrow, MD 09/14/19 [phone]

## 2019-09-15 DIAGNOSIS — C5701 Malignant neoplasm of right fallopian tube: Secondary | ICD-10-CM | POA: Diagnosis not present

## 2019-09-15 DIAGNOSIS — C762 Malignant neoplasm of abdomen: Secondary | ICD-10-CM

## 2019-09-15 DIAGNOSIS — D6481 Anemia due to antineoplastic chemotherapy: Secondary | ICD-10-CM | POA: Diagnosis not present

## 2019-09-15 DIAGNOSIS — I950 Idiopathic hypotension: Secondary | ICD-10-CM | POA: Diagnosis not present

## 2019-09-15 LAB — CBC
HCT: 26.7 % — ABNORMAL LOW (ref 36.0–46.0)
Hemoglobin: 9 g/dL — ABNORMAL LOW (ref 12.0–15.0)
MCH: 33.1 pg (ref 26.0–34.0)
MCHC: 33.7 g/dL (ref 30.0–36.0)
MCV: 98.2 fL (ref 80.0–100.0)
Platelets: 168 10*3/uL (ref 150–400)
RBC: 2.72 MIL/uL — ABNORMAL LOW (ref 3.87–5.11)
RDW: 19.8 % — ABNORMAL HIGH (ref 11.5–15.5)
WBC: 6.1 10*3/uL (ref 4.0–10.5)
nRBC: 0 % (ref 0.0–0.2)

## 2019-09-15 LAB — BASIC METABOLIC PANEL
Anion gap: 14 (ref 5–15)
Anion gap: 12 (ref 5–15)
BUN: 48 mg/dL — ABNORMAL HIGH (ref 6–20)
BUN: 46 mg/dL — ABNORMAL HIGH (ref 6–20)
CO2: 38 mmol/L — ABNORMAL HIGH (ref 22–32)
CO2: 36 mmol/L — ABNORMAL HIGH (ref 22–32)
Calcium: 8 mg/dL — ABNORMAL LOW (ref 8.9–10.3)
Calcium: 8.1 mg/dL — ABNORMAL LOW (ref 8.9–10.3)
Chloride: 76 mmol/L — ABNORMAL LOW (ref 98–111)
Chloride: 78 mmol/L — ABNORMAL LOW (ref 98–111)
Creatinine, Ser: 1.82 mg/dL — ABNORMAL HIGH (ref 0.44–1.00)
Creatinine, Ser: 1.6 mg/dL — ABNORMAL HIGH (ref 0.44–1.00)
GFR calc Af Amer: 35 mL/min — ABNORMAL LOW (ref >60)
GFR calc Af Amer: 41 mL/min — ABNORMAL LOW (ref >60)
GFR calc non Af Amer: 31 mL/min — ABNORMAL LOW (ref >60)
GFR calc non Af Amer: 36 mL/min — ABNORMAL LOW (ref >60)
Glucose, Bld: 96 mg/dL (ref 70–99)
Glucose, Bld: 87 mg/dL (ref 70–99)
Potassium: 3.2 mmol/L — ABNORMAL LOW (ref 3.5–5.1)
Potassium: 3.6 mmol/L (ref 3.5–5.1)
Sodium: 128 mmol/L — ABNORMAL LOW (ref 135–145)
Sodium: 126 mmol/L — ABNORMAL LOW (ref 135–145)

## 2019-09-15 MED ORDER — POTASSIUM CHLORIDE 2 MEQ/ML IV SOLN: 125.0000 mL/h | INTRAVENOUS | 0 refills | Status: DC

## 2019-09-15 MED ORDER — SCOPOLAMINE 1 MG/3DAYS TD PT72: 1.0000 | MEDICATED_PATCH | TRANSDERMAL | 0 refills | Status: AC | PRN

## 2019-09-15 MED ORDER — LORAZEPAM 0.5 MG PO TABS: 0.5000 mg | ORAL_TABLET | Freq: Three times a day (TID) | ORAL | 0 refills | Status: AC | PRN

## 2019-09-15 MED ORDER — HEPARIN SOD (PORK) LOCK FLUSH 100 UNIT/ML IV SOLN
500.0000 [IU] | Freq: Once | INTRAVENOUS | Status: AC
  Administered 2019-09-15: 500 [IU]
  Filled 2019-09-15: qty 5

## 2019-09-15 MED ORDER — POTASSIUM CHLORIDE ER 10 MEQ PO TBCR: 10.0000 meq | EXTENDED_RELEASE_TABLET | Freq: Every day | ORAL | 1 refills | Status: DC | PRN

## 2019-09-15 MED ORDER — SERTRALINE HCL 50 MG PO TABS: 75.0000 mg | ORAL_TABLET | Freq: Every day | ORAL | 0 refills | Status: AC

## 2019-09-15 MED ORDER — POTASSIUM CHLORIDE ER 20 MEQ PO TBCR: 20.0000 meq | EXTENDED_RELEASE_TABLET | Freq: Every day | ORAL | 1 refills | Status: AC

## 2019-09-15 MED ORDER — POTASSIUM CHLORIDE 10 MEQ/100ML IV SOLN
10.0000 meq | INTRAVENOUS | Status: AC
  Administered 2019-09-15 (×2): 10 meq via INTRAVENOUS
  Filled 2019-09-15 (×2): qty 100

## 2019-09-15 MED ORDER — ONDANSETRON 8 MG PO TBDP: 8.0000 mg | ORAL_TABLET | Freq: Four times a day (QID) | ORAL | 0 refills | Status: AC

## 2019-09-15 MED ORDER — POTASSIUM CHLORIDE ER 10 MEQ PO TBCR: 10.0000 meq | EXTENDED_RELEASE_TABLET | Freq: Every day | ORAL | 1 refills | Status: DC

## 2019-09-15 MED ORDER — LIDOCAINE 5 % EX PTCH: 1.0000 | MEDICATED_PATCH | CUTANEOUS | 0 refills | Status: AC

## 2019-09-15 NOTE — ED Notes (Signed)
Port dressing and biopatch changed, pt repositioned in bed, KCl and fluids infusing

## 2019-09-15 NOTE — Progress Notes (Signed)
CM received call from MD stating patient can dc home today but will need IV fluids in the home.  Patient already active with [facility] and CM spoke with Carolynn Sayers and notified of need for potassium containing fluids.  Per Pam, agency is unable to provide K containing fluids in the home setting.  This was communicated to the MD and plan was amended to included oral potassium supplementation and pre-admission fluid regimen.  Plan is for [facility] to reach out to Dr Jana Hakim with oncology and discuss any changes that can be made to further meet patient's needs.  Patient is currently receiving [facility] services with [facility] and agency rep was notified that services will need to resume along with HHPT/OT/aide per MD orders.

## 2019-09-15 NOTE — ED Notes (Signed)
Pt assisted onto bedpan, gown changed. Pt repositioned in bed. Pt states she vomited earlier, given nausea meds.

## 2019-09-15 NOTE — Progress Notes (Signed)
Consult request has been received. CSW attempting to follow up at present time.  Per the ED Secretary pt was to be admitted but now is not and that [facility]  Was consulted for [facility] needs.  RN CM was updated and CSW was asked to ask the EDP to place orders for [facility] with RN/Aide, PT, OT and social work and orders for face-to-face, if EDP thinks Kathryn Ramsey is appropriate.  CSW will continue to follow for D/C needs.  Alphonse Guild. Kathryn Ramsey  MSW, LCSW, LCAS, CSI Transitions of Care Clinical Social Worker Care Coordination Department Ph: [phone]

## 2019-09-15 NOTE — Discharge Summary (Addendum)
Physician Discharge Summary   Patient ID: Kathryn Ramsey MRN: [id number] DOB/AGE: Jan 25, 1966 54 y.o.  Admit date: 09/14/2019 Discharge date: 09/15/2019  Primary Care Physician:  Chauncey Cruel, MD   Recommendations for Outpatient Follow-up:  1. Follow up with PCP in 1-2 weeks 2. Recommended to obtain BMET on 09/17/2019 (Monday) 3. Recommended D5 half-normal saline with 20 MEQ K at home for infusion.  However home health agency are not able to do IV fluids with potassium replacement.  4. Hence patient can continue D5 half-normal saline as previously 125 cc an hour.  She will have to take 20 mEq p.o. potassium daily.  Home Health: Home health PT OT, RN Equipment/Devices: None  Discharge Condition: Fair, overall poor prognosis CODE STATUS: DNR Diet recommendation: Clear / full liquid diet   Discharge Diagnoses:     Acute kidney injury . Severe hypokalemia Hyponatremia Hypochloremia . Hypotension History of metastatic ovarian/high-grade right fallopian tube carcinoma . Anemia due to antineoplastic chemotherapy . 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 Severe protein calorie malnutrition Prolonged QTC Constipation   Consults: None    Allergies:   Allergies  Allergen Reactions  . Bee Venom Shortness Of Breath    swelling  . Prochlorperazine Edisylate Anaphylaxis, Anxiety and Palpitations    Anaphylaxis, Anxiety and Palpitations   . Sulfa Antibiotics Shortness Of Breath    Vomit , diarrhea , hives  . Phenergan [Promethazine Hcl] Other (See Comments)    "funny sensation in my throat"  . Zarxio [Filgrastim]      DISCHARGE MEDICATIONS: Allergies as of 09/15/2019      Reactions   Bee Venom Shortness Of Breath   swelling   Prochlorperazine Edisylate Anaphylaxis, Anxiety, Palpitations   Anaphylaxis, Anxiety and Palpitations    Sulfa Antibiotics Shortness Of Breath   Vomit , diarrhea , hives   Phenergan [promethazine Hcl] Other (See Comments)   "funny sensation in my  throat"   Zarxio [filgrastim]       Medication List    TAKE these medications   acetaminophen 500 MG tablet Commonly known as: TYLENOL Take 2 tablets by mouth 3 (three) times daily as needed for moderate pain.   albuterol 108 (90 Base) MCG/ACT inhaler Commonly known as: VENTOLIN HFA Inhale 2 puffs into the lungs every 6 (six) hours as needed for wheezing or shortness of breath.   dicyclomine 10 MG capsule Commonly known as: BENTYL TAKE 1 CAPSULE 4 TIMES DAILY BEFORE MEALS AND AT BEDTIME. What changed: See the new instructions.   diphenhydrAMINE 50 MG capsule Commonly known as: BENADRYL 50 mg Diphenhydramine (Benadryl) PO within 1 hour of the injection   feeding supplement (ENSURE ENLIVE) Liqd Take 237 mLs by mouth 3 (three) times daily after meals.   fentaNYL 12 MCG/HR Commonly known as: [place] 1 patch onto the skin every 3 (three) days.   gabapentin 100 MG capsule Commonly known as: Neurontin 200 mg bid with 300 mg qhs What changed:   how much to take  how to take this  when to take this  additional instructions   lactulose 10 GM/15ML solution Commonly known as: CHRONULAC Take 10 g by mouth daily as needed for moderate constipation.   lidocaine 5 % Commonly known as: LIDODERM Place 1 patch onto the skin daily. Remove & Discard patch within 12 hours or as directed by MD   LORazepam 0.5 MG tablet Commonly known as: ATIVAN Take 1 tablet (0.5 mg total) by mouth every 8 (eight) hours as needed for anxiety (refractory nausea and  vomiting). What changed: See the new instructions.   melatonin 3 MG Tabs tablet Take 1 tablet (3 mg total) by mouth at bedtime. What changed:   when to take this  reasons to take this   omeprazole 40 MG capsule Commonly known as: PRILOSEC Take 1 capsule (40 mg total) by mouth daily.   ondansetron 8 MG disintegrating tablet Commonly known as: ZOFRAN-ODT Take 1 tablet (8 mg total) by mouth every 6 (six) hours.    Potassium Chloride ER 20 MEQ Tbcr Take 20 mEq by mouth daily.   scopolamine 1 MG/3DAYS Commonly known as: Transderm-Scop (1.5 MG) Place 1 patch (1.5 mg total) onto the skin every 3 (three) days as needed (refractory nausea and vomiting).   sertraline 50 MG tablet Commonly known as: ZOLOFT Take 1.5 tablets (75 mg total) by mouth daily. PLEASE HOLD until follow-up with your doctor and repeat labs What changed: See the new instructions.   traMADol 50 MG tablet Commonly known as: ULTRAM TAKE ONE TABLET EVERY 6 HOURS AS NEEDED. What changed: See the new instructions.   TYLENOL/CODEINE #3 300-30 MG tablet Generic drug: Acetaminophen-Codeine Take 1 tablet by mouth every 4 (four) hours as needed for pain.        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  She has persistent nausea, vomiting, poor appetite, dysphagia.  Last bowel movement was more than a week ago.  Patient receives IV fluid hydration at home, followed by palliative care.  Per patient and husband at the bedside, they had not started hospice yet.  Patient reports mild abdominal pain and takes Tylenol with codeine.  States Zofran helps with the nausea. Per patient, they have tried NG tube during previous admission but was unsuccessful.  IR was unable to place a percutaneous J-tube J G-tube.  At the time of discharge, patient was able to tolerate more p.o. and full liquid diet Per patient, she is able to eat clear liquid or full liquid diet but has persistent nausea. Patient was admitted 7/15-7/20/2021 for similar presentation.  ED work-up/course:  In ED, sodium 130, potassium 2.1, chloride 66, bicarb 44, BUN 49, creatinine 1.8,  magnesium 1.9.  Hemoglobin 11.0, platelets 210 Temp 98.1, respirate 19, pulse 79, BP 83/70, down to 76/65, received IV fluid bolus, improved to 97/75, alert and oriented.  Hospital Course:   Acute kidney injury -Likely due to profound dehydration, contraction alkalosis, hyponatremia, hypokalemia hypochloremia, -Due to hypotension, with SBP in 70s, gave IV normal saline fluid bolus, followed by sodium chloride infusion at 125 cc an hour - will continue clear liquid diet and advance to full liquids as tolerated, continue nutritional supplements - Creatinine 1.6 (Cr 1.85 on admission) -Patient now feeling close to her baseline, does not want to stay in the hospital.  Husband at the bedside, wants to go home.  Hypotension -Per patient she has baseline hypotension, patient was placed on  IV fluid hydration - BP 97/73 on discharge, asymptomatic     Severe hypokalemia, hypochloremia, hyponatremia -Potassium 2.1, magnesium 1.9, sodium 130 -patient was placed on aggressive potassium replacement - K 3.6 on discharge  -Patient's home health agency was contacted to change the IV fluids to D5 half-normal saline with 20 mEq K.  However home health agency cannot  provide the fluids with potassium replacement. -  Prescription for oral potassium sent, patient will take 2 tabs (20 mEq every day),  repeat labs on Monday, 09/17/2019  History of peritoneal carcinomatosis, high-grade right fallopian tube carcinoma, Port-A-Cath in place -Followed by Dr. Jana Hakim outpatient, currently under palliative care.  Per patient and husband at the bedside, has not started hospice yet but they know how to contact hospice. -Per patient, NG tube and feeding tube had been attempted however she is not a candidate for PEG tube She requested to continue her baseline clear liquid or full liquids with nutritional supplements as she can tolerate  Prolonged QTC -QTc 583, hold off on QT prolonging medications -Unfortunately  allergic to Phenergan, Compazine.  -Patient will continue Ativan as needed for refractory nausea and vomiting, Zofran cautiously.  I also gave her a prescription for scopolamine patch if needed for refractory nausea and vomiting.  Neuropathy due to chemotherapy -Continue Neurontin, Lidoderm patch  Constipation -Continue lactulose as needed  Severe protein calorie malnutrition -Prealbumin 12.4 -Placed on nutritional supplements     Day of Discharge S: Feels as close to baseline as she can get, wants to go home.  Does not want to be stay stuck in the hospital  BP 95/75 (BP Location: Right Arm)   Pulse 70   Temp 98.1 F (36.7 C) (Oral)   Resp (!) 11   SpO2 97%   Physical Exam: General: Alert and awake oriented x3 ill-appearing HEENT: anicteric sclera, pupils reactive to light and accommodation CVS: S1-S2 clear no murmur rubs or gallops Chest: clear to auscultation bilaterally, no wheezing rales or rhonchi Abdomen: soft nontender, nondistended, normal bowel sounds Extremities: no cyanosis, clubbing or edema noted bilaterally Neuro: no new deficits    Get Medicines reviewed and adjusted: Please take all your medications with you for your next visit with your Primary MD  Please request your Primary MD to go over all hospital tests and procedure/radiological results at the follow up. Please ask your Primary MD to get all Hospital records sent to his/her office.  If you experience worsening of your admission symptoms, develop shortness of breath, life threatening emergency, suicidal or homicidal thoughts you must seek medical attention immediately by calling 911 or calling your MD immediately  if symptoms less severe.  You must read complete instructions/literature along with all the possible adverse reactions/side effects for all the Medicines you take and that have been prescribed to you. Take any new Medicines after you have completely understood and accept all the possible  adverse reactions/side effects.   Do not drive when taking pain medications.   Do not take more than prescribed Pain, Sleep and Anxiety Medications  Special Instructions: If you have smoked or chewed Tobacco  in the last 2 yrs please stop smoking, stop any regular Alcohol  and or any Recreational drug use.  Wear Seat belts while driving.  Please note  You were cared for by a hospitalist during your hospital stay. Once you are discharged, your primary care physician will handle any further medical issues. Please note that NO REFILLS for any discharge medications will be authorized once you are discharged, as it is imperative that you return to your primary care physician (or establish a relationship with a primary care physician if you do not have one) for your aftercare needs so that they can reassess your need for medications and monitor your lab values.   The results of significant diagnostics from this hospitalization (including imaging, microbiology, ancillary  and laboratory) are listed below for reference.      Procedures/Studies:  CT ABDOMEN PELVIS W CONTRAST  Result Date: 08/30/2019 CLINICAL DATA:  Abdominal pain, bilious vomiting, ovarian cancer EXAM: CT ABDOMEN AND PELVIS WITH CONTRAST TECHNIQUE: Multidetector CT imaging of the abdomen and pelvis was performed using the standard protocol following bolus administration of intravenous contrast. CONTRAST:  84mL OMNIPAQUE IOHEXOL 300 MG/ML  SOLN COMPARISON:  07/30/2019 FINDINGS: Lower chest: Moderate left pleural effusion is again identified with compressive atelectasis of the left lung base. Visualized right lung base is clear. Central venous catheter tip noted within the superior cavoatrial junction. Cardiac size within normal limits. Hepatobiliary: Cholecystectomy has been performed. Stable hemangioma within the a inferior right hepatic lobe. The liver is otherwise unremarkable. No intra or extrahepatic biliary ductal dilation  identified. Pancreas: Unremarkable Spleen: Unremarkable Adrenals/Urinary Tract: The adrenal glands are unremarkable. Mass effect upon the right kidney by the distended duodenum is again identified, however, the kidneys are otherwise unremarkable. There is normal symmetric renal cortical enhancement. The bladder appears circumferentially mildly thick walled and demonstrates moderate perivesicular inflammatory stranding, similar to prior examination, possibly reflecting a chronic inflammatory process such as a chronic infection or radiation cystitis in the appropriate clinical setting. The bladder is not distended. Stomach/Bowel: There is massive distension of the a first and second portion of the duodenum to the level of the a point at which it crosses the midline. In this region, however, there is reasonable space between the abdominal aorta and superior mesenteric artery making SMA syndrome unlikely as a cause of obstruction. There is, however, numerous thick walled loops of small bowel again identified clustered within the mid abdomen. There is moderate ascites contain within thickened peritoneum, similar to that noted on prior examination. 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 No focal point of transition is identified, though the caliber of bowel does improve once the ileum exits the encapsulating peritoneum, best seen on axial image # 60/2. No free intraperitoneal gas. The large bowel is unremarkable. Vascular/Lymphatic: No pathologic adenopathy seen within the abdomen and pelvis. The inferior vena cava is compressed, progressive since prior examination, likely a combination of the encapsulating process as well as some degree of hypovolemia. The arterial vasculature is unremarkable. Reproductive: Uterus absent.  No adnexal masses. Other: There is diffuse body wall wasting noted, progressive since prior  examination. Musculoskeletal: The osseous structures are unremarkable. IMPRESSION: Diffuse bowel wall thickening and central clumping within encapsulating thickened peritoneum and moderate ascites. 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 This is most commonly seen in the setting of peritoneal dialysis or peritoneal carcinomatosis. Massive distention of the duodenum is progressive since prior examination. Stable chronic bladder wall thickening and perivesicular inflammatory stranding, possibly reflecting changes of a radiation cystitis in the appropriate clinical setting. Progressive body wall wasting. Electronically Signed   By: Fidela Salisbury MD   On: 08/30/2019 20:35   DG Abd 2 Views  Result Date: 08/27/2019 CLINICAL DATA:  Obstruction. EXAM: ABDOMEN - 2 VIEW COMPARISON:  None. FINDINGS: The bowel gas pattern is normal. Status post cholecystectomy. There is no evidence of free air. No radio-opaque calculi or other significant radiographic abnormality is seen. IMPRESSION: No evidence of bowel obstruction or ileus. Mild to moderate size probably loculated left pleural effusion is noted. Electronically Signed   By: Marijo Conception M.D.   On: 08/27/2019 12:24      LAB  RESULTS: Basic Metabolic Panel: Recent Labs  Lab 09/14/19 1256 09/14/19 1805 09/14/19 2332 09/15/19 0359  NA 130*   < > 128* 126*  K 2.1*   < > 3.2* 3.6  CL 66*   < > 76* 78*  CO2 44*   < > 38* 36*  GLUCOSE 108*   < > 96 87  BUN 49*   < > 48* 46*  CREATININE 1.80*   < > 1.82* 1.60*  CALCIUM 9.2   < > 8.0* 8.1*  MG 1.9  --   --   --    < > = values in this interval not displayed.   Liver Function Tests: No results for input(s): AST, ALT, ALKPHOS, BILITOT, PROT, ALBUMIN in the last 168 hours. No results for input(s): LIPASE, AMYLASE in the last 168 hours. No results for input(s): AMMONIA in the last 168 hours. CBC: Recent Labs  Lab 09/14/19 1256 09/14/19 1256 09/15/19 0415   WBC 6.5  --  6.1  NEUTROABS 5.5  --   --   HGB 11.0*  --  9.0*  HCT 32.2*  --  26.7*  MCV 96.4   < > 98.2  PLT 210  --  168   < > = values in this interval not displayed.   Cardiac Enzymes: No results for input(s): CKTOTAL, CKMB, CKMBINDEX, TROPONINI in the last 168 hours. BNP: Invalid input(s): POCBNP CBG: No results for input(s): GLUCAP in the last 168 hours.     Disposition and Follow-up: Discharge Instructions    Discharge instructions   Complete by: As directed    Clear or full liquid diet. Please check labs (metabolic panel) on Monday   Increase activity slowly   Complete by: As directed        DISPOSITION: Home with home health PT OT RN   Tus[place], Virgie Dad, MD. Schedule an appointment as soon as possible for a visit in 1 week(s).   Specialty: Oncology Contact information: [place] [address] [phone]                Time coordinating discharge:  35 minutes  Signed:   Estill Cotta M.D. [facility] 09/15/2019, 12:56 PM

## 2019-09-15 NOTE — Progress Notes (Signed)
CSW spoke to the [facility] Inpt RN CM who is working on [employer] for this pt but due to the need for IV's w/potassium and other issues all the details have not been finalized and as such pt cannot D/C yet.  CSW will continue to follow for D/C needs.  Alphonse Guild. Rodrick Payson  MSW, LCSW, LCAS, CCS Transitions of Care Clinical Social Worker Care Coordination Department Ph: [phone]

## 2019-09-17 ENCOUNTER — Other Ambulatory Visit: Payer: Self-pay | Admitting: Internal Medicine

## 2019-09-17 ENCOUNTER — Other Ambulatory Visit: Payer: Self-pay

## 2019-09-17 ENCOUNTER — Encounter: Payer: 59 | Admitting: Physical Therapy

## 2019-09-17 ENCOUNTER — Other Ambulatory Visit: Payer: 59 | Admitting: Internal Medicine

## 2019-09-17 ENCOUNTER — Other Ambulatory Visit: Payer: Self-pay | Admitting: Oncology

## 2019-09-17 ENCOUNTER — Encounter: Payer: Self-pay | Admitting: Internal Medicine

## 2019-09-17 DIAGNOSIS — Z7189 Other specified counseling: Secondary | ICD-10-CM

## 2019-09-17 DIAGNOSIS — Z515 Encounter for palliative care: Secondary | ICD-10-CM

## 2019-09-17 NOTE — Progress Notes (Signed)
Aug 2nd, 2021 [facility] Palliative Care Consult Note Telephone: [phone]  Fax: [phone]   PATIENT NAME: Kathryn Ramsey DOB: 07-Oct-1965 MRN: [id number]  [place], [place] [address]   REFERRING / PRIMARY PROVIDER: Dr. Sarajane Jews Magrinat   RESPONSIBLE PARTY:   Dallaire,Lloyd "Tom" (Spouse)  [phone] ([place])   ASSESSMENT / RECOMMENDATIONS:  1. Advance Care Planning: A. Directives: HCPOA (#1 [facility] [place] [phone], #2 [facility] Blocker [facility] [phone]) in [facility] EMR. DNR. Form present in the home.  B. Goals of Care: Home death Patient doesn't want to go to the hospital. She fears that her children wouldn't be able to visit her there. Anticipates home death. We have ongoing discussions of scope of Hospice Services. Mardell feels she has adequate support with her large family, friend group, and church family. She knows she can resource me as well, for questions and concerns. We did discuss that Hospice likely wouldn't draw home labs for serum Potassium checks or provide home runs of IV Potassium. We discussed the ramifications of a low serum K. Currently patient wishes to have these options (K runs) open to her.   2. Cognitive / Functional status, Symptom Management (reviewed and updated): Patient is pleasant; oriented x 3. Knowledgeable regarding current state of health and poor prognosis.    Patient continues weak; needs steadying assist to transfer and ambulate (rollator walker).   Patient took MSO4 liquid concentrate (5mg ) for the first time last hs for moderately severe back pain and some abdominal pain and had a great night's sleep. This morning patient's son massaged her back and applied heating pad. Patient took an Aleve, and another 5mg  MSO4. Her back is much improved. We discussed it's perfectly okay for her to take MSO4 at hs to aid in sleep and pain management. 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   Known obstructing/near obstructing distal duodenum. Past unsuccessful attempts to place NG tube. She is not a PEG candidate. Continues with emesis. Vomits up just about all liquids taken in, as well as bile. Preceding nausea usually relieved after vomiting. She's been vomiting since March and has pretty much taken this as a fact of her life. Nausea managed with Zofran or Ativan; she alternates. She's been taking Ativan at bedtime along with the MSO4 to help her sleep. She doesn't eat any solids.  Cachectic. IVFs in the home 1 liter of D5  NS alternating with 528ml D5 NS both at 176ml/hr via her port-a-cath. SN ([facility]), for IVF supply ([facility]). Patient's family proficient / independent with changing out the IVF bags. She isn't thirsty so she's getting adequate fluid intake. She hasn't had a bowel movement in a while.    3. Family / Community Supports: Retired Futures trader. Her husband [name] is a Engineer, maintenance (IT). Their 4 children are in their 20's. Church is [employer].   4. Follow up Palliative Care Visit: I will call in a few days to check in how they are doing. They have my contact information if they need to call.   I spent 60 minutes providing this consultation from 3-4pm. More than 50% of the time in this consultation was spent coordinating communication.    HISTORY OF PRESENT ILLNESS:  Kathryn Ramsey is a very sweet 54 y.o. female with metastatic ovarian cancer -Hospitalized 08/2019 CT: obstruction 2d duodenum. Unsuccessful attempts NG tube placement.    This is a f/u Palliative Care visit from  July 27th.    CODE STATUS: DNR   PPS: 20% to weak 30%   HOSPICE ELIGIBILITY/DIAGNOSIS: yes/metastatic ovarian cancer  PAST MEDICAL HISTORY:  Past Medical History:  Diagnosis Date  . Acute sensory neuropathy 06/26/2015  . Allergy   . Anemia   . Asthma    illness induced asthma  . Blood transfusion without reported diagnosis    at age 55 years old D/T  surgery to femur being crushed  . 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 Heart murmur    pt, states had a "working heart murmur"  . History of kidney stones   . Neutropenia, drug-induced ([place]) 03/10/2018  . PONV (postoperative nausea and vomiting)   . Skin cancer     SOCIAL HX:  Social History   Tobacco Use  . Smoking status: Never Smoker  . Smokeless tobacco: Never Used  Substance Use Topics  . Alcohol use: Yes    Comment:  3 glasses wine or beer/week    ALLERGIES:  Allergies  Allergen Reactions  . Bee Venom Shortness Of Breath    swelling  . Prochlorperazine Edisylate Anaphylaxis, Anxiety and Palpitations    Anaphylaxis, Anxiety and Palpitations   . Sulfa Antibiotics Shortness Of Breath    Vomit , diarrhea , hives  . Phenergan [Promethazine Hcl] Other (See Comments)    "funny sensation in my throat"  . Zarxio [Filgrastim]      PERTINENT MEDICATIONS:  Outpatient Encounter Medications as of 09/17/2019  Medication Sig  . acetaminophen (TYLENOL) 500 MG tablet Take 2 tablets by mouth 3 (three) times daily as needed for moderate pain.   . Acetaminophen-Codeine (TYLENOL/CODEINE #3) 300-30 MG tablet Take 1 tablet by mouth every 4 (four) hours as needed for pain.  [name] albuterol (VENTOLIN HFA) 108 (90 Base) MCG/ACT inhaler Inhale 2 puffs into the lungs every 6 (six) hours as needed for wheezing or shortness of breath.   . dicyclomine (BENTYL) 10 MG capsule TAKE 1 CAPSULE 4 TIMES DAILY BEFORE MEALS AND AT BEDTIME. (Patient taking differently: Take 10 mg by mouth 4 (four) times daily as needed for spasms. )  . diphenhydrAMINE (BENADRYL) 50 MG capsule 50 mg Diphenhydramine (Benadryl) PO within 1 hour of the injection  . feeding supplement, ENSURE ENLIVE, (ENSURE ENLIVE) LIQD Take 237 mLs by mouth 3 (three) times daily after meals. (Patient not taking: Reported on 09/12/2019)  . fentaNYL (DURAGESIC) 12 MCG/HR Place 1  patch onto the skin every 3 (three) days. (Patient not taking: Reported on 09/12/2019)  . gabapentin (NEURONTIN) 100 MG capsule 200 mg bid with 300 mg qhs (Patient taking differently: Take 200 mg by mouth at bedtime. )  . lactulose (CHRONULAC) 10 GM/15ML solution Take 10 g by mouth daily as needed for moderate constipation.  (Patient not taking: Reported on 09/12/2019)  . lidocaine (LIDODERM) 5 % Place 1 patch onto the skin daily. Remove & Discard patch within 12 hours or as directed by MD  . LORazepam (ATIVAN) 0.5 MG tablet Take 1 tablet (0.5 mg total) by mouth every 8 (eight) hours as needed for anxiety (refractory nausea and vomiting).  . melatonin 3 MG TABS tablet Take 1 tablet (3 mg total) by mouth at bedtime. (Patient taking differently: Take 3 mg by mouth at bedtime as needed (sleep aid). )  . omeprazole (PRILOSEC) 40 MG capsule Take 1 capsule (40 mg total) by mouth daily.  . ondansetron (  ZOFRAN-ODT) 8 MG disintegrating tablet Take 1 tablet (8 mg total) by mouth every 6 (six) hours.  . potassium chloride 20 MEQ TBCR Take 20 mEq by mouth daily.  [name] scopolamine (TRANSDERM-SCOP, 1.5 MG,) 1 MG/3DAYS Place 1 patch (1.5 mg total) onto the skin every 3 (three) days as needed (refractory nausea and vomiting).  . sertraline (ZOLOFT) 50 MG tablet Take 1.5 tablets (75 mg total) by mouth daily. PLEASE HOLD until follow-up with your doctor and repeat labs  . traMADol (ULTRAM) 50 MG tablet TAKE ONE TABLET EVERY 6 HOURS AS NEEDED. (Patient taking differently: Take 50 mg by mouth every 6 (six) hours as needed for moderate pain or severe pain. )   Facility-Administered Encounter Medications as of 09/17/2019  Medication  . sodium chloride flush (NS) 0.9 % injection 10 mL  GENERAL  EXAM:   Frail, cachectic, very sweet middle-aged female sitting up on the couch. Emesis bag close at hand. She is alert and oriented x 3, and pleasantly conversant. She has good knowledge and insight into her current condition. Her spouse  and one of her sons are in attendance.   Julianne Handler, NP  [place]

## 2019-09-17 NOTE — Progress Notes (Signed)
I called Kathryn Ramsey on herself today but she did not answer.  I then called her husband [name].  They went to the emergency room this weekend because of the low potassium noted on be met last week.  The ED tried to set her up for intravenous potassium but this is not done at home so she was started on oral potassium they have the instructions of crushing the pill and taking it twice a day so it would be 10 mill milliequivalents twice daily.  However so far Virdell has declined to take this.  She did take morphine for the first time last night and she was able to sleep for hours which is quite good for her at this point.  The morphine does not seem to have caused her any confusion or other concerns.  We will repeat a be met this Thursday when her port is reaccessed

## 2019-09-21 ENCOUNTER — Telehealth: Payer: Self-pay | Admitting: *Deleted

## 2019-09-21 NOTE — Telephone Encounter (Signed)
This RN received labs drawn yesterday per home health with noted k+ of 2.2- reviewed with covering provider.  This RN called pt and her husband- discussed - of note pt has not been taking potassium at home per order.  She would prefer not to received IV potassium- and stated " I will get back on it and take it ".  Pt understands significance that the low potassium if not corrected could cause life threatening issues.  This RN validated pt's current status and plan.  No further needs at this time.  This RN called above to [employer] with [facility] per [place].

## 2019-09-26 ENCOUNTER — Other Ambulatory Visit (HOSPITAL_COMMUNITY)
Admission: AD | Admit: 2019-09-26 | Discharge: 2019-09-26 | Disposition: A | Discharge status: Home / Self Care | Payer: 59 | Source: Other Acute Inpatient Hospital | Attending: Oncology | Admitting: Oncology

## 2019-09-26 ENCOUNTER — Other Ambulatory Visit: Payer: Self-pay | Admitting: Oncology

## 2019-09-26 ENCOUNTER — Telehealth: Payer: Self-pay | Admitting: *Deleted

## 2019-09-26 DIAGNOSIS — D6481 Anemia due to antineoplastic chemotherapy: Secondary | ICD-10-CM | POA: Insufficient documentation

## 2019-09-26 LAB — CBC WITH DIFFERENTIAL/PLATELET
Abs Immature Granulocytes: 0.01 10*3/uL (ref 0.00–0.07)
Basophils Absolute: 0 10*3/uL (ref 0.0–0.1)
Basophils Relative: 0 %
Eosinophils Absolute: 0 10*3/uL (ref 0.0–0.5)
Eosinophils Relative: 0 %
HCT: 23.4 % — ABNORMAL LOW (ref 36.0–46.0)
Hemoglobin: 8.2 g/dL — ABNORMAL LOW (ref 12.0–15.0)
Immature Granulocytes: 0 %
Lymphocytes Relative: 17 %
Lymphs Abs: 0.4 10*3/uL — ABNORMAL LOW (ref 0.7–4.0)
MCH: 33.6 pg (ref 26.0–34.0)
MCHC: 35 g/dL (ref 30.0–36.0)
MCV: 95.9 fL (ref 80.0–100.0)
Monocytes Absolute: 0.1 10*3/uL (ref 0.1–1.0)
Monocytes Relative: 6 %
Neutro Abs: 1.9 10*3/uL (ref 1.7–7.7)
Neutrophils Relative %: 77 %
Platelets: 133 10*3/uL — ABNORMAL LOW (ref 150–400)
RBC: 2.44 MIL/uL — ABNORMAL LOW (ref 3.87–5.11)
RDW: 18.5 % — ABNORMAL HIGH (ref 11.5–15.5)
WBC: 2.5 10*3/uL — ABNORMAL LOW (ref 4.0–10.5)
nRBC: 0 % (ref 0.0–0.2)

## 2019-09-26 LAB — COMPREHENSIVE METABOLIC PANEL
ALT: 12 U/L (ref 0–44)
AST: 21 U/L (ref 15–41)
Albumin: 2.7 g/dL — ABNORMAL LOW (ref 3.5–5.0)
Alkaline Phosphatase: 118 U/L (ref 38–126)
Anion gap: 14 (ref 5–15)
BUN: 25 mg/dL — ABNORMAL HIGH (ref 6–20)
CO2: 46 mmol/L — ABNORMAL HIGH (ref 22–32)
Calcium: 7.3 mg/dL — ABNORMAL LOW (ref 8.9–10.3)
Chloride: <65 mmol/L — CL (ref 98–111)
Creatinine, Ser: 1.37 mg/dL — ABNORMAL HIGH (ref 0.44–1.00)
GFR calc Af Amer: 50 mL/min — ABNORMAL LOW (ref >60)
GFR calc non Af Amer: 43 mL/min — ABNORMAL LOW (ref >60)
Glucose, Bld: 101 mg/dL — ABNORMAL HIGH (ref 70–99)
Potassium: <2 mmol/L — CL (ref 3.5–5.1)
Sodium: 124 mmol/L — ABNORMAL LOW (ref 135–145)
Total Bilirubin: 1 mg/dL (ref 0.3–1.2)
Total Protein: 5.5 g/dL — ABNORMAL LOW (ref 6.5–8.1)

## 2019-09-26 NOTE — Telephone Encounter (Signed)
This RN received notice home care nurse from [facility] came to the home to start new IVF with potassium.  Noted BP reading today was 60/44 and unreadable in other arm.  [name] informed nurse that she " may have taken a smidge more morphine this morning the I usually do"   This RN spoke with Kathlee Nations and informed MD of above.  Of note pt will be getting 1500 ml of fluid including 20 mEq of potassium this evening.

## 2019-09-26 NOTE — Progress Notes (Signed)
I called Kathryn Ramsey and spoke with her and her husband Kathryn Ramsey by speaker phone. Kathryn Ramsey's phone is on "silence" so it does not disturb her when people call so when I called there there was no answer. You really have to call tell me if you want her get through and then he can put you on speaker phone as he did this evening. Kathryn Ramsey actually sounds pretty good she tells me she feels weak. She has not been eating anything for several days and she feels better that way because when she eats she gets cramps and then has to vomit and is really quite uncomfortable. She says her blood pressure was very low today and she took some extra morphine this morning and she feels that is very likely the reason. She is now receiving D5W with 20 mEq of potassium which I think is going to help the potassium issue. We also discussed the possibility of maybe getting some fruit that she could use perhaps watermelon or something like that to get a little extra potassium if she could get it down  They know to call us if any problems develop that we can help with

## 2019-09-28 ENCOUNTER — Telehealth: Payer: Self-pay | Admitting: *Deleted

## 2019-09-28 NOTE — Telephone Encounter (Signed)
This RN spoke with pt's husband - [name]- stating [name] is requesting a Hospice referral.  He states her pain is increasing - they have been advised on use of her morphine per Palliative and " it seems to be holding her good"  Pt is now sleeping more " she is just so tired and worn " per Tom.  This RN validated above discussion.  No present needs except request for referral needed at this time.  This RN contacted Hospice per [facility] care and requested Hospice Services.

## 2019-10-03 ENCOUNTER — Encounter: Payer: 59 | Admitting: Physical Therapy

## 2019-10-05 ENCOUNTER — Other Ambulatory Visit: Payer: Self-pay | Admitting: Oncology

## 2019-10-05 MED ORDER — MORPHINE SULFATE (CONCENTRATE) 10 MG /0.5 ML PO SOLN: 10.0000 mg | ORAL | 0 refills | Status: AC | PRN

## 2019-10-10 ENCOUNTER — Encounter: Payer: 59 | Admitting: Physical Therapy

## 2019-10-15 ENCOUNTER — Other Ambulatory Visit: Payer: Self-pay | Admitting: Oncology

## 2019-10-17 ENCOUNTER — Encounter: Payer: 59 | Admitting: Physical Therapy

## 2019-10-17 DEATH — deceased

## 2020-07-04 IMAGING — RF DG UGI W/ HIGH DENSITY W/O KUB
11 of 13 series · 16 of 24 positions shown · non-contrast
Comparison: CT of 04/23/2019

CLINICAL DATA: Ovarian cancer diagnosed 4.5 years ago. Vomiting 1
time a day for 2 weeks. Feeling of lump in throat.

EXAM:
UPPER GI SERIES WITHOUT KUB
TECHNIQUE: Routine upper GI series was performed with thin and high density
barium.
FLUOROSCOPY TIME:  Fluoroscopy Time:  5 minutes and 40 seconds
Radiation Exposure Index (if provided by the fluoroscopic device):
Not applicable.
Number of Acquired Spot Images: 1

[Series 1: cp_standard · 0.51mm/px · 1 of 445 frames shown (1 of 11)]
[frame 67/445]
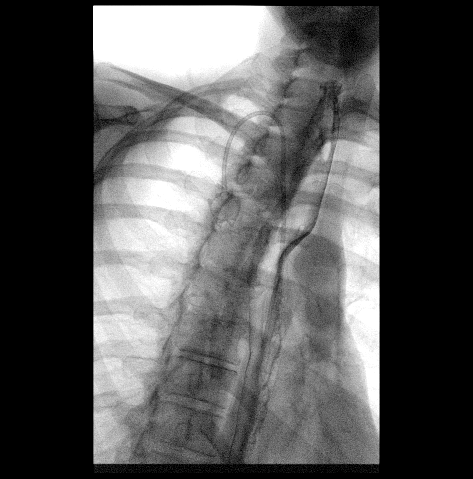

[Series 2: cp_standard · 0.51mm/px · 2 of 70 frames shown (2 of 11)]
[frame 11/70]
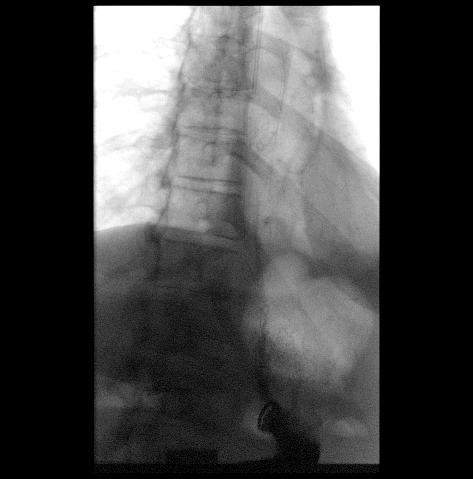
[frame 44/70]
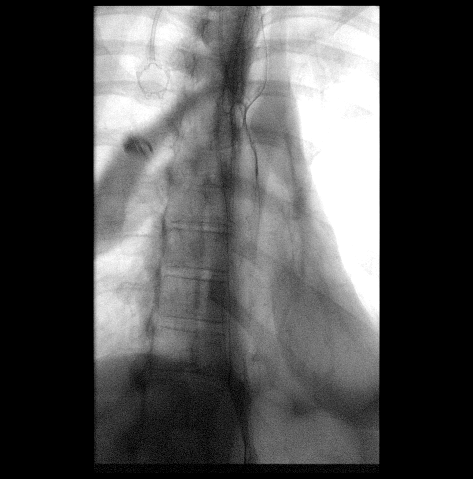

[Series 3: cp_standard · 0.51mm/px · 2 of 616 frames shown (3 of 11)]
[frame 309/616]
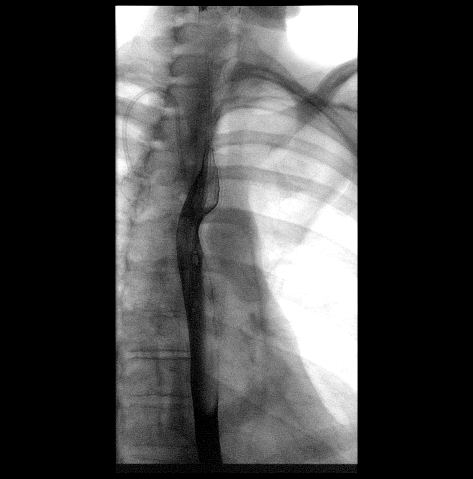
[frame 524/616]
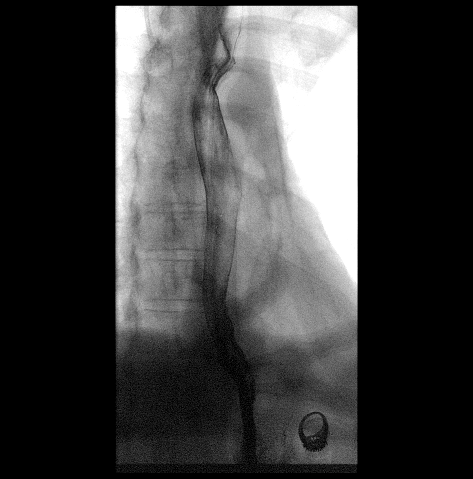

[Series 7: cp_standard · 0.25mm/px · 1 of 1 slices shown (4 of 11)]
[im 1/1]
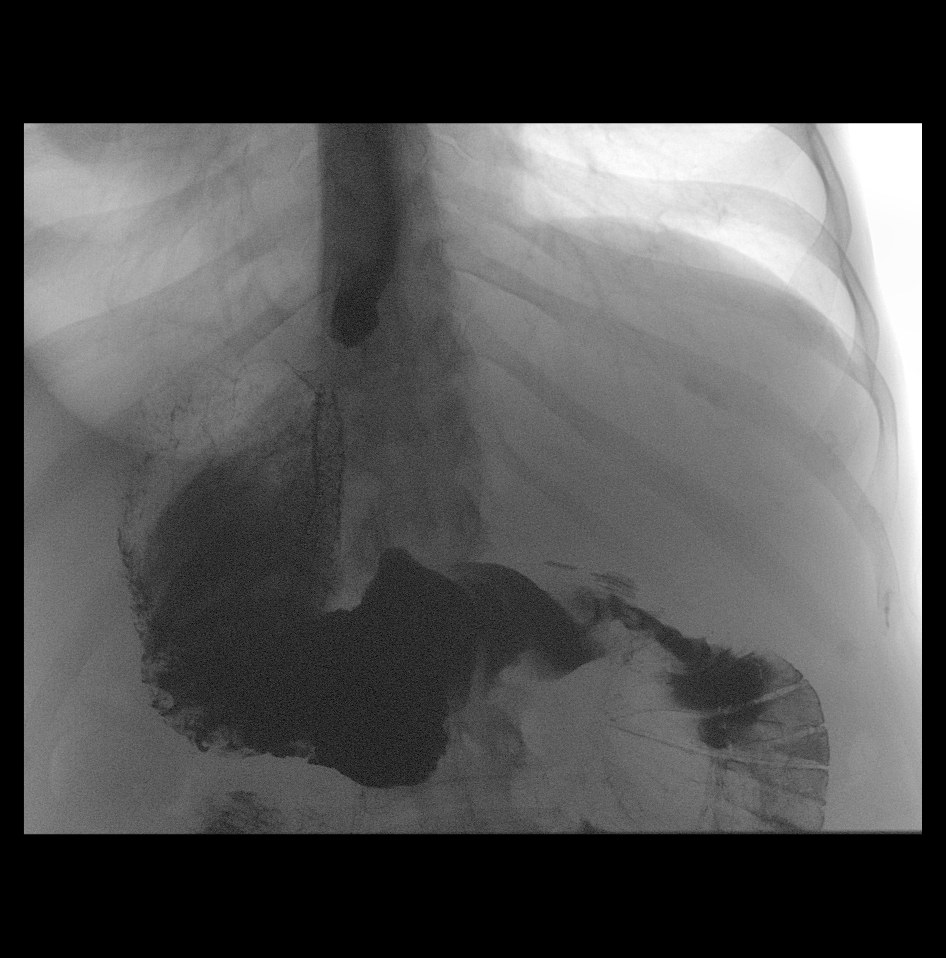

[Series 9: cp_standard · 0.51mm/px · 1 of 165 frames shown (5 of 11)]
[frame 25/165]
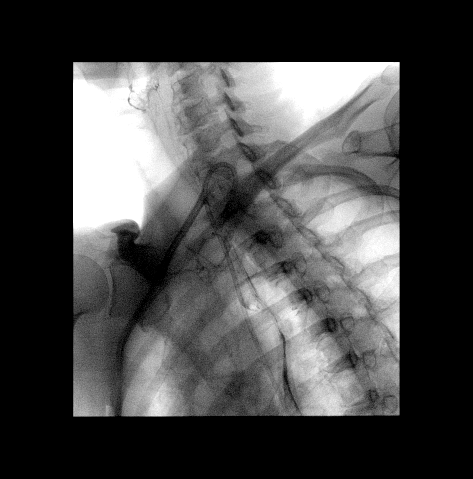

[Series 10: cp_standard · 0.51mm/px · 2 of 280 frames shown (6 of 11)]
[frame 25/280]
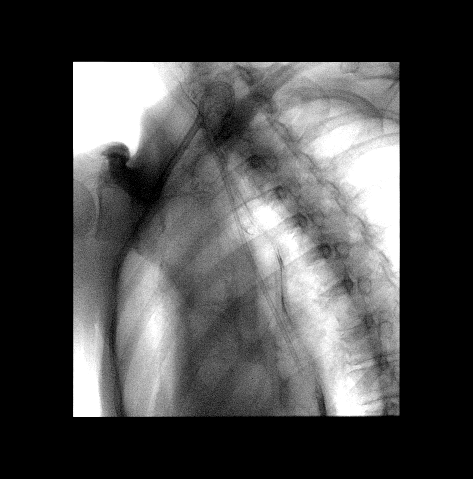
[frame 43/280]
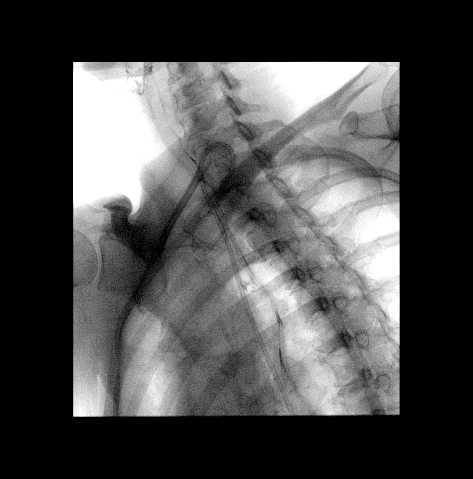

[Series 12: cp_standard · 0.25mm/px · 1 of 1 slices shown (7 of 11)]
[im 1/1]
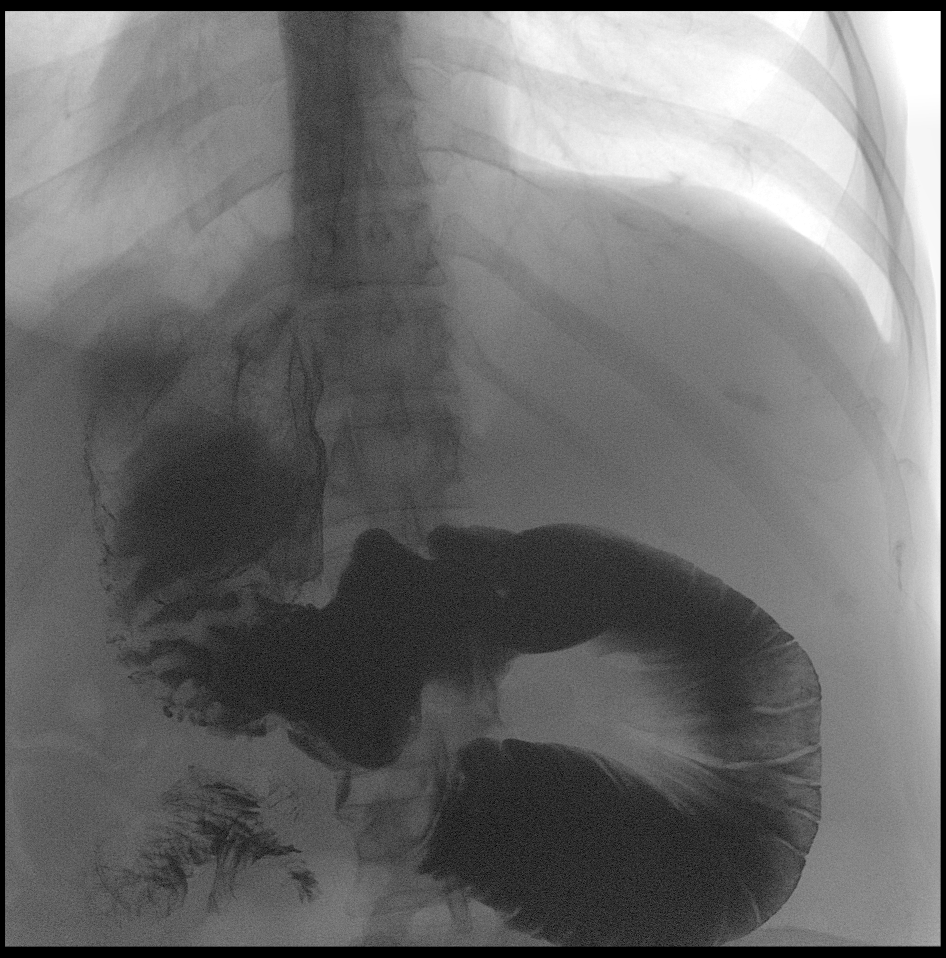

[Series 14: cp_standard · 0.25mm/px · 1 of 1 slices shown (8 of 11)]
[im 1/1]
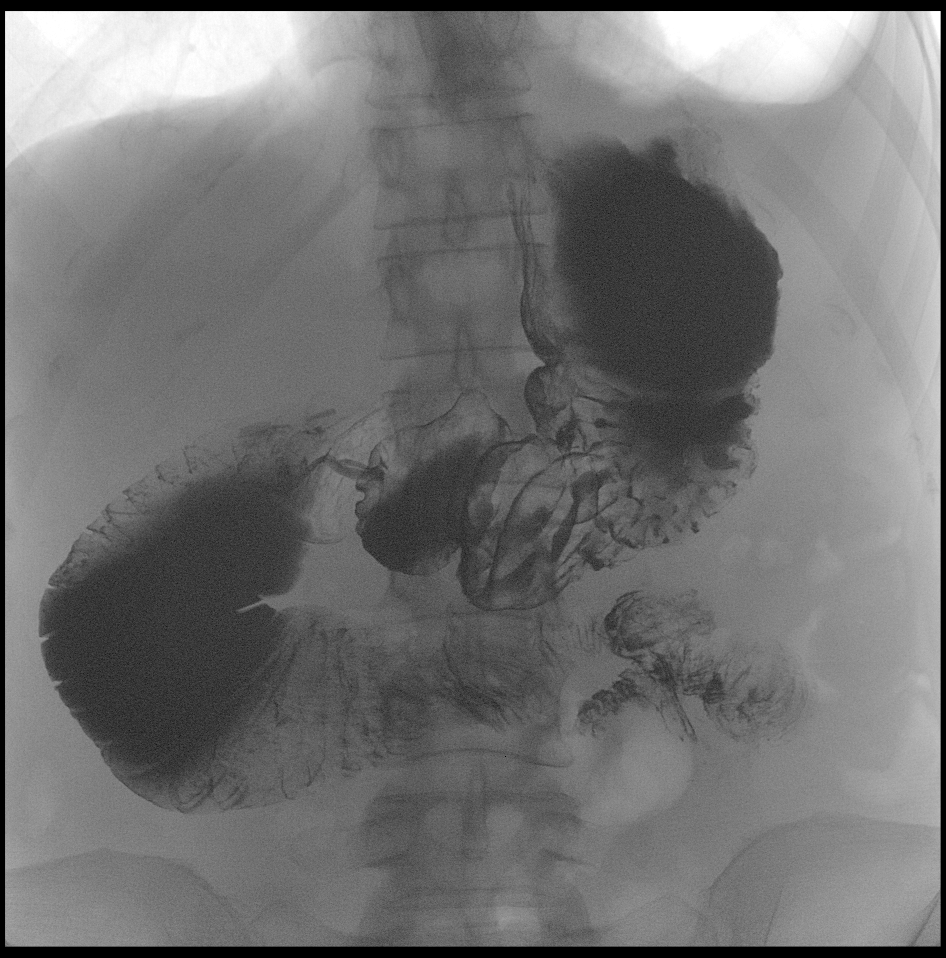

[Series 17: cp_standard · 0.34mm/px · 2 of 55 frames shown (9 of 11)]
[frame 18/55]
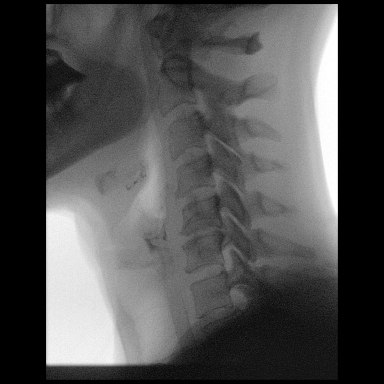
[frame 47/55]
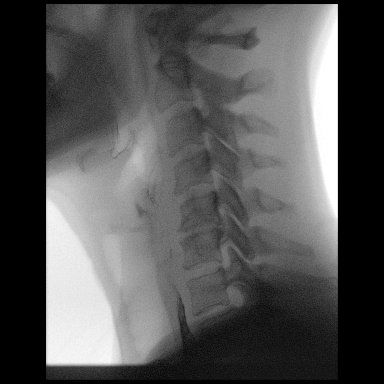

[Series 19: cp_standard · 0.37mm/px · 2 of 121 frames shown (10 of 11)]
[frame 54/121]
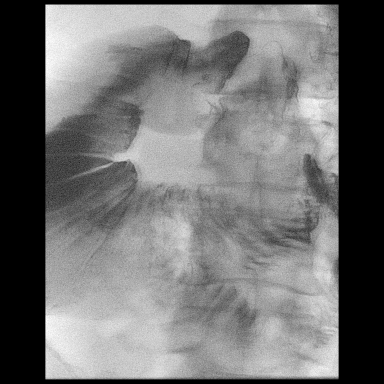
[frame 103/121]
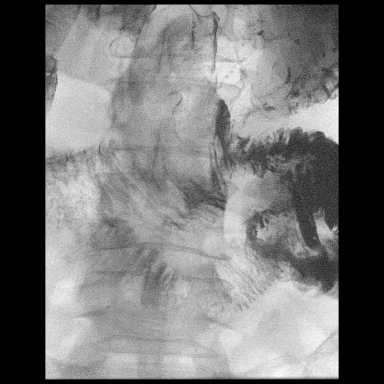

[Series 20: cp_standard · 0.36mm/px · 1 of 97 frames shown (11 of 11)]
[frame 83/97]
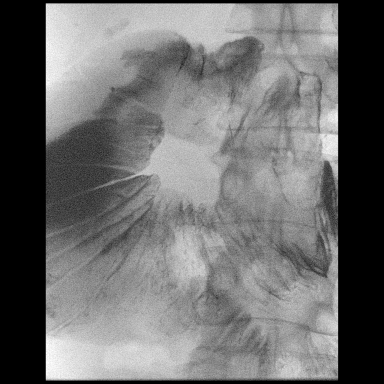

[16 of 24 positions shown; findings below may reference images not displayed]

FINDINGS: Double contrast evaluation of the esophagus and stomach are
minimally limited, as patient had coughing spells and near emesis
during administration of double contrast agent. Given this
limitation, no mucosal abnormality identified.

Double contrast evaluation of the stomach is also mildly degraded.
No dominant mass or ulcer seen. No evidence of gastric outlet
obstruction.

The duodenal bulb is normal.

Single-contrast evaluation of the hypopharynx and esophagus are
unremarkable, without persistent narrowing or stricture.

There is moderate dilatation of the duodenal C loop.

Delayed image demonstrates contrast within the proximal jejunum.
IMPRESSION: 1. Mild limitations involving the double contrast portion of the
exam as detailed above.
2. No evidence of hypopharyngeal or esophageal abnormality to
explain feeling of lump in throat or dysphagia.
3. No gastric outlet obstruction. Duodenal C-loop dilatation likely
related to partial small bowel obstruction, when compared to
04/23/2019 CT.

## 2020-10-16 DEATH — deceased

## 2021-09-07 ENCOUNTER — Other Ambulatory Visit: Payer: Self-pay
